# Patient Record
Sex: Female | Born: 1940
Health system: Southern US, Community
[De-identification: ages and names within clinical notes are randomized; demographics above are authoritative.]

## PROBLEM LIST (undated history)

## (undated) DIAGNOSIS — I4891 Unspecified atrial fibrillation: Secondary | ICD-10-CM

## (undated) DIAGNOSIS — I501 Left ventricular failure: Secondary | ICD-10-CM

## (undated) DIAGNOSIS — Z951 Presence of aortocoronary bypass graft: Secondary | ICD-10-CM

## (undated) DIAGNOSIS — N189 Chronic kidney disease, unspecified: Secondary | ICD-10-CM

## (undated) DIAGNOSIS — I255 Ischemic cardiomyopathy: Secondary | ICD-10-CM

## (undated) DIAGNOSIS — E1140 Type 2 diabetes mellitus with diabetic neuropathy, unspecified: Secondary | ICD-10-CM

## (undated) DIAGNOSIS — M1990 Unspecified osteoarthritis, unspecified site: Secondary | ICD-10-CM

## (undated) DIAGNOSIS — Z95810 Presence of automatic (implantable) cardiac defibrillator: Secondary | ICD-10-CM

## (undated) DIAGNOSIS — E118 Type 2 diabetes mellitus with unspecified complications: Secondary | ICD-10-CM

## (undated) DIAGNOSIS — I509 Heart failure, unspecified: Secondary | ICD-10-CM

## (undated) DIAGNOSIS — I2109 ST elevation (STEMI) myocardial infarction involving other coronary artery of anterior wall: Secondary | ICD-10-CM

## (undated) DIAGNOSIS — E785 Hyperlipidemia, unspecified: Secondary | ICD-10-CM

## (undated) DIAGNOSIS — R011 Cardiac murmur, unspecified: Secondary | ICD-10-CM

## (undated) DIAGNOSIS — I340 Nonrheumatic mitral (valve) insufficiency: Secondary | ICD-10-CM

## (undated) DIAGNOSIS — J449 Chronic obstructive pulmonary disease, unspecified: Secondary | ICD-10-CM

## (undated) DIAGNOSIS — E039 Hypothyroidism, unspecified: Secondary | ICD-10-CM

## (undated) DIAGNOSIS — I2510 Atherosclerotic heart disease of native coronary artery without angina pectoris: Secondary | ICD-10-CM

## (undated) DIAGNOSIS — N390 Urinary tract infection, site not specified: Secondary | ICD-10-CM

## (undated) DIAGNOSIS — I10 Essential (primary) hypertension: Secondary | ICD-10-CM

## (undated) DIAGNOSIS — I447 Left bundle-branch block, unspecified: Secondary | ICD-10-CM

## (undated) HISTORY — DX: Hypothyroidism, unspecified: E03.9

## (undated) HISTORY — DX: Unspecified osteoarthritis, unspecified site: M19.90

## (undated) HISTORY — PX: TOTAL HIP ARTHROPLASTY: SHX124

## (undated) HISTORY — DX: Hyperlipidemia, unspecified: E78.5

## (undated) HISTORY — DX: Atherosclerotic heart disease of native coronary artery without angina pectoris: I25.10

## (undated) HISTORY — PX: ABDOMINAL HYSTERECTOMY: SHX81

## (undated) HISTORY — DX: Heart failure, unspecified: I50.9

## (undated) HISTORY — DX: Nonrheumatic mitral (valve) insufficiency: I34.0

## (undated) HISTORY — DX: Essential (primary) hypertension: I10

## (undated) HISTORY — DX: Cardiac murmur, unspecified: R01.1

## (undated) SURGERY — Surgical Case
Anesthesia: *Unknown

---

## 1997-12-28 ENCOUNTER — Encounter: Admission: RE | Admit: 1997-12-28 | Discharge: 1997-12-28 | Payer: Self-pay | Admitting: Internal Medicine

## 1998-07-05 ENCOUNTER — Encounter: Admission: RE | Admit: 1998-07-05 | Discharge: 1998-07-05 | Payer: Self-pay | Admitting: Internal Medicine

## 1998-07-05 ENCOUNTER — Ambulatory Visit (HOSPITAL_COMMUNITY): Admission: RE | Admit: 1998-07-05 | Discharge: 1998-07-05 | Payer: Self-pay | Admitting: Internal Medicine

## 1998-12-02 ENCOUNTER — Encounter: Admission: RE | Admit: 1998-12-02 | Discharge: 1998-12-02 | Payer: Self-pay | Admitting: Internal Medicine

## 1999-01-28 ENCOUNTER — Encounter: Admission: RE | Admit: 1999-01-28 | Discharge: 1999-01-28 | Payer: Self-pay | Admitting: Internal Medicine

## 1999-02-17 ENCOUNTER — Encounter: Admission: RE | Admit: 1999-02-17 | Discharge: 1999-02-17 | Payer: Self-pay | Admitting: Internal Medicine

## 1999-03-30 ENCOUNTER — Encounter: Admission: RE | Admit: 1999-03-30 | Discharge: 1999-03-30 | Payer: Self-pay | Admitting: *Deleted

## 1999-04-04 ENCOUNTER — Encounter (INDEPENDENT_AMBULATORY_CARE_PROVIDER_SITE_OTHER): Payer: Self-pay | Admitting: Specialist

## 1999-04-04 ENCOUNTER — Other Ambulatory Visit: Admission: RE | Admit: 1999-04-04 | Discharge: 1999-04-04 | Payer: Self-pay | Admitting: Ophthalmology

## 1999-09-30 ENCOUNTER — Encounter: Admission: RE | Admit: 1999-09-30 | Discharge: 1999-09-30 | Payer: Self-pay | Admitting: Internal Medicine

## 1999-12-28 ENCOUNTER — Encounter: Admission: RE | Admit: 1999-12-28 | Discharge: 1999-12-28 | Payer: Self-pay | Admitting: Hematology and Oncology

## 2000-03-16 ENCOUNTER — Encounter: Admission: RE | Admit: 2000-03-16 | Discharge: 2000-03-16 | Payer: Self-pay | Admitting: Internal Medicine

## 2000-05-21 ENCOUNTER — Encounter: Admission: RE | Admit: 2000-05-21 | Discharge: 2000-05-21 | Payer: Self-pay | Admitting: Internal Medicine

## 2000-05-21 ENCOUNTER — Encounter: Payer: Self-pay | Admitting: Internal Medicine

## 2000-05-21 ENCOUNTER — Ambulatory Visit (HOSPITAL_COMMUNITY): Admission: RE | Admit: 2000-05-21 | Discharge: 2000-05-21 | Payer: Self-pay | Admitting: Internal Medicine

## 2000-06-05 ENCOUNTER — Encounter: Admission: RE | Admit: 2000-06-05 | Discharge: 2000-07-11 | Payer: Self-pay | Admitting: *Deleted

## 2000-07-09 ENCOUNTER — Encounter: Admission: RE | Admit: 2000-07-09 | Discharge: 2000-07-09 | Payer: Self-pay | Admitting: Hematology and Oncology

## 2000-10-09 ENCOUNTER — Encounter: Admission: RE | Admit: 2000-10-09 | Discharge: 2000-10-09 | Payer: Self-pay | Admitting: Hematology and Oncology

## 2001-01-28 ENCOUNTER — Encounter: Admission: RE | Admit: 2001-01-28 | Discharge: 2001-01-28 | Payer: Self-pay | Admitting: Internal Medicine

## 2001-02-26 ENCOUNTER — Encounter: Admission: RE | Admit: 2001-02-26 | Discharge: 2001-04-10 | Payer: Self-pay | Admitting: *Deleted

## 2001-05-21 ENCOUNTER — Encounter: Admission: RE | Admit: 2001-05-21 | Discharge: 2001-05-21 | Payer: Self-pay | Admitting: Internal Medicine

## 2001-05-28 ENCOUNTER — Encounter: Admission: RE | Admit: 2001-05-28 | Discharge: 2001-05-28 | Payer: Self-pay | Admitting: Internal Medicine

## 2001-09-02 ENCOUNTER — Encounter: Admission: RE | Admit: 2001-09-02 | Discharge: 2001-09-02 | Payer: Self-pay

## 2001-11-28 ENCOUNTER — Encounter: Admission: RE | Admit: 2001-11-28 | Discharge: 2001-11-28 | Payer: Self-pay | Admitting: Internal Medicine

## 2001-12-03 ENCOUNTER — Encounter: Admission: RE | Admit: 2001-12-03 | Discharge: 2001-12-03 | Payer: Self-pay

## 2001-12-18 ENCOUNTER — Encounter: Admission: RE | Admit: 2001-12-18 | Discharge: 2001-12-18 | Payer: Self-pay

## 2001-12-19 ENCOUNTER — Ambulatory Visit (HOSPITAL_COMMUNITY): Admission: RE | Admit: 2001-12-19 | Discharge: 2001-12-19 | Payer: Self-pay

## 2002-01-14 ENCOUNTER — Encounter: Admission: RE | Admit: 2002-01-14 | Discharge: 2002-01-14 | Payer: Self-pay | Admitting: Internal Medicine

## 2002-01-21 ENCOUNTER — Ambulatory Visit (HOSPITAL_COMMUNITY): Admission: RE | Admit: 2002-01-21 | Discharge: 2002-01-21 | Payer: Self-pay | Admitting: Internal Medicine

## 2002-01-21 ENCOUNTER — Encounter: Payer: Self-pay | Admitting: Internal Medicine

## 2002-02-17 ENCOUNTER — Encounter: Admission: RE | Admit: 2002-02-17 | Discharge: 2002-02-17 | Payer: Self-pay | Admitting: Internal Medicine

## 2002-02-18 ENCOUNTER — Encounter: Payer: Self-pay | Admitting: Internal Medicine

## 2002-02-18 ENCOUNTER — Encounter: Admission: RE | Admit: 2002-02-18 | Discharge: 2002-02-18 | Payer: Self-pay | Admitting: Internal Medicine

## 2002-02-18 ENCOUNTER — Ambulatory Visit (HOSPITAL_COMMUNITY): Admission: RE | Admit: 2002-02-18 | Discharge: 2002-02-18 | Payer: Self-pay

## 2002-03-03 ENCOUNTER — Encounter: Admission: RE | Admit: 2002-03-03 | Discharge: 2002-03-03 | Payer: Self-pay | Admitting: Internal Medicine

## 2002-03-14 ENCOUNTER — Encounter: Admission: RE | Admit: 2002-03-14 | Discharge: 2002-03-14 | Payer: Self-pay | Admitting: Internal Medicine

## 2002-04-16 ENCOUNTER — Encounter: Admission: RE | Admit: 2002-04-16 | Discharge: 2002-04-16 | Payer: Self-pay | Admitting: Internal Medicine

## 2002-04-23 ENCOUNTER — Encounter: Admission: RE | Admit: 2002-04-23 | Discharge: 2002-04-23 | Payer: Self-pay | Admitting: Internal Medicine

## 2002-08-08 ENCOUNTER — Encounter: Admission: RE | Admit: 2002-08-08 | Discharge: 2002-08-08 | Payer: Self-pay | Admitting: Internal Medicine

## 2002-11-19 ENCOUNTER — Encounter: Admission: RE | Admit: 2002-11-19 | Discharge: 2002-11-19 | Payer: Self-pay | Admitting: Internal Medicine

## 2003-04-06 ENCOUNTER — Encounter: Admission: RE | Admit: 2003-04-06 | Discharge: 2003-04-06 | Payer: Self-pay | Admitting: Internal Medicine

## 2003-05-14 ENCOUNTER — Encounter: Admission: RE | Admit: 2003-05-14 | Discharge: 2003-05-14 | Payer: Self-pay | Admitting: Obstetrics and Gynecology

## 2003-05-25 ENCOUNTER — Encounter: Admission: RE | Admit: 2003-05-25 | Discharge: 2003-05-25 | Payer: Self-pay | Admitting: Internal Medicine

## 2003-08-11 ENCOUNTER — Encounter: Admission: RE | Admit: 2003-08-11 | Discharge: 2003-08-11 | Payer: Self-pay | Admitting: Internal Medicine

## 2004-01-05 ENCOUNTER — Encounter: Admission: RE | Admit: 2004-01-05 | Discharge: 2004-01-05 | Payer: Self-pay | Admitting: Internal Medicine

## 2004-01-07 ENCOUNTER — Encounter (INDEPENDENT_AMBULATORY_CARE_PROVIDER_SITE_OTHER): Payer: Self-pay | Admitting: Internal Medicine

## 2004-01-07 ENCOUNTER — Ambulatory Visit (HOSPITAL_COMMUNITY): Admission: RE | Admit: 2004-01-07 | Discharge: 2004-01-07 | Payer: Self-pay | Admitting: Internal Medicine

## 2004-06-14 ENCOUNTER — Ambulatory Visit: Payer: Self-pay | Admitting: Internal Medicine

## 2004-06-22 ENCOUNTER — Encounter: Admission: RE | Admit: 2004-06-22 | Discharge: 2004-06-22 | Payer: Self-pay | Admitting: Internal Medicine

## 2004-06-30 ENCOUNTER — Ambulatory Visit: Payer: Self-pay | Admitting: Internal Medicine

## 2004-11-04 ENCOUNTER — Ambulatory Visit: Payer: Self-pay | Admitting: Internal Medicine

## 2004-11-07 ENCOUNTER — Ambulatory Visit: Payer: Self-pay | Admitting: Internal Medicine

## 2004-11-14 ENCOUNTER — Encounter: Admission: RE | Admit: 2004-11-14 | Discharge: 2005-02-12 | Payer: Self-pay | Admitting: Internal Medicine

## 2005-01-30 ENCOUNTER — Ambulatory Visit: Payer: Self-pay | Admitting: Internal Medicine

## 2005-01-30 ENCOUNTER — Ambulatory Visit (HOSPITAL_COMMUNITY): Admission: RE | Admit: 2005-01-30 | Discharge: 2005-01-30 | Payer: Self-pay | Admitting: Internal Medicine

## 2005-02-06 ENCOUNTER — Ambulatory Visit: Payer: Self-pay | Admitting: Internal Medicine

## 2005-02-16 ENCOUNTER — Encounter (INDEPENDENT_AMBULATORY_CARE_PROVIDER_SITE_OTHER): Payer: Self-pay | Admitting: *Deleted

## 2005-02-16 ENCOUNTER — Ambulatory Visit (HOSPITAL_COMMUNITY): Admission: RE | Admit: 2005-02-16 | Discharge: 2005-02-16 | Payer: Self-pay | Admitting: Internal Medicine

## 2006-06-18 ENCOUNTER — Ambulatory Visit: Payer: Self-pay | Admitting: Gastroenterology

## 2006-07-02 ENCOUNTER — Ambulatory Visit: Payer: Self-pay | Admitting: Gastroenterology

## 2006-07-02 ENCOUNTER — Encounter (INDEPENDENT_AMBULATORY_CARE_PROVIDER_SITE_OTHER): Payer: Self-pay | Admitting: *Deleted

## 2006-08-06 DIAGNOSIS — E1169 Type 2 diabetes mellitus with other specified complication: Secondary | ICD-10-CM | POA: Insufficient documentation

## 2006-08-06 DIAGNOSIS — M1990 Unspecified osteoarthritis, unspecified site: Secondary | ICD-10-CM | POA: Insufficient documentation

## 2006-08-06 DIAGNOSIS — E118 Type 2 diabetes mellitus with unspecified complications: Secondary | ICD-10-CM

## 2006-08-06 DIAGNOSIS — I10 Essential (primary) hypertension: Secondary | ICD-10-CM | POA: Insufficient documentation

## 2006-08-06 DIAGNOSIS — E785 Hyperlipidemia, unspecified: Secondary | ICD-10-CM | POA: Insufficient documentation

## 2006-08-06 HISTORY — DX: Type 2 diabetes mellitus with unspecified complications: E11.8

## 2006-08-06 HISTORY — DX: Unspecified osteoarthritis, unspecified site: M19.90

## 2006-12-20 DIAGNOSIS — E669 Obesity, unspecified: Secondary | ICD-10-CM | POA: Insufficient documentation

## 2007-09-09 ENCOUNTER — Encounter: Admission: RE | Admit: 2007-09-09 | Discharge: 2007-09-09 | Payer: Self-pay | Admitting: Orthopaedic Surgery

## 2007-10-07 ENCOUNTER — Encounter: Admission: RE | Admit: 2007-10-07 | Discharge: 2007-10-07 | Payer: Self-pay | Admitting: Orthopaedic Surgery

## 2007-11-05 ENCOUNTER — Inpatient Hospital Stay (HOSPITAL_COMMUNITY): Admission: RE | Admit: 2007-11-05 | Discharge: 2007-11-11 | Payer: Self-pay | Admitting: Orthopaedic Surgery

## 2007-11-10 ENCOUNTER — Ambulatory Visit: Payer: Self-pay | Admitting: Vascular Surgery

## 2007-11-10 ENCOUNTER — Encounter (INDEPENDENT_AMBULATORY_CARE_PROVIDER_SITE_OTHER): Payer: Self-pay | Admitting: Orthopaedic Surgery

## 2008-02-03 ENCOUNTER — Encounter: Admission: RE | Admit: 2008-02-03 | Discharge: 2008-02-03 | Payer: Self-pay | Admitting: Orthopaedic Surgery

## 2008-05-29 ENCOUNTER — Ambulatory Visit (HOSPITAL_COMMUNITY): Admission: RE | Admit: 2008-05-29 | Discharge: 2008-05-29 | Payer: Self-pay | Admitting: Internal Medicine

## 2008-07-25 ENCOUNTER — Encounter (INDEPENDENT_AMBULATORY_CARE_PROVIDER_SITE_OTHER): Payer: Self-pay | Admitting: Emergency Medicine

## 2008-07-25 ENCOUNTER — Ambulatory Visit: Payer: Self-pay | Admitting: *Deleted

## 2008-07-25 ENCOUNTER — Emergency Department (HOSPITAL_COMMUNITY): Admission: EM | Admit: 2008-07-25 | Discharge: 2008-07-25 | Payer: Self-pay | Admitting: Emergency Medicine

## 2008-12-11 ENCOUNTER — Inpatient Hospital Stay (HOSPITAL_COMMUNITY): Admission: RE | Admit: 2008-12-11 | Discharge: 2008-12-16 | Payer: Self-pay | Admitting: Orthopaedic Surgery

## 2008-12-12 ENCOUNTER — Encounter (INDEPENDENT_AMBULATORY_CARE_PROVIDER_SITE_OTHER): Payer: Self-pay | Admitting: Orthopedic Surgery

## 2008-12-12 ENCOUNTER — Ambulatory Visit: Payer: Self-pay | Admitting: Vascular Surgery

## 2009-03-19 ENCOUNTER — Emergency Department (HOSPITAL_COMMUNITY): Admission: EM | Admit: 2009-03-19 | Discharge: 2009-03-19 | Payer: Self-pay | Admitting: Emergency Medicine

## 2009-06-03 ENCOUNTER — Ambulatory Visit (HOSPITAL_COMMUNITY): Admission: RE | Admit: 2009-06-03 | Discharge: 2009-06-03 | Payer: Self-pay | Admitting: Internal Medicine

## 2009-08-28 HISTORY — PX: CORNEAL TRANSPLANT: SHX108

## 2010-06-06 ENCOUNTER — Ambulatory Visit (HOSPITAL_COMMUNITY): Admission: RE | Admit: 2010-06-06 | Discharge: 2010-06-06 | Payer: Self-pay | Admitting: Internal Medicine

## 2010-12-07 LAB — PROTIME-INR
INR: 2 — ABNORMAL HIGH (ref 0.00–1.49)
INR: 2.5 — ABNORMAL HIGH (ref 0.00–1.49)
INR: 2.7 — ABNORMAL HIGH (ref 0.00–1.49)
Prothrombin Time: 13.8 seconds (ref 11.6–15.2)
Prothrombin Time: 19.4 seconds — ABNORMAL HIGH (ref 11.6–15.2)
Prothrombin Time: 24.3 seconds — ABNORMAL HIGH (ref 11.6–15.2)
Prothrombin Time: 28.9 seconds — ABNORMAL HIGH (ref 11.6–15.2)
Prothrombin Time: 30.4 seconds — ABNORMAL HIGH (ref 11.6–15.2)

## 2010-12-07 LAB — CBC
Hemoglobin: 12.4 g/dL (ref 12.0–15.0)
MCHC: 33.5 g/dL (ref 30.0–36.0)
MCV: 81.2 fL (ref 78.0–100.0)
Platelets: 204 10*3/uL (ref 150–400)
Platelets: 220 10*3/uL (ref 150–400)
Platelets: 290 10*3/uL (ref 150–400)
RBC: 3.41 MIL/uL — ABNORMAL LOW (ref 3.87–5.11)
RDW: 16.8 % — ABNORMAL HIGH (ref 11.5–15.5)
RDW: 17 % — ABNORMAL HIGH (ref 11.5–15.5)
WBC: 10.5 10*3/uL (ref 4.0–10.5)
WBC: 10.7 10*3/uL — ABNORMAL HIGH (ref 4.0–10.5)

## 2010-12-07 LAB — GLUCOSE, CAPILLARY
Glucose-Capillary: 114 mg/dL — ABNORMAL HIGH (ref 70–99)
Glucose-Capillary: 117 mg/dL — ABNORMAL HIGH (ref 70–99)
Glucose-Capillary: 117 mg/dL — ABNORMAL HIGH (ref 70–99)
Glucose-Capillary: 127 mg/dL — ABNORMAL HIGH (ref 70–99)
Glucose-Capillary: 132 mg/dL — ABNORMAL HIGH (ref 70–99)
Glucose-Capillary: 147 mg/dL — ABNORMAL HIGH (ref 70–99)
Glucose-Capillary: 81 mg/dL (ref 70–99)
Glucose-Capillary: 83 mg/dL (ref 70–99)

## 2010-12-07 LAB — BASIC METABOLIC PANEL
BUN: 9 mg/dL (ref 6–23)
BUN: 22 mg/dL (ref 6–23)
CO2: 30 mEq/L (ref 19–32)
Calcium: 9.1 mg/dL (ref 8.4–10.5)
Calcium: 10.4 mg/dL (ref 8.4–10.5)
Chloride: 100 mEq/L (ref 96–112)
Chloride: 101 mEq/L (ref 96–112)
Creatinine, Ser: 1.05 mg/dL (ref 0.4–1.2)
Creatinine, Ser: 1.26 mg/dL — ABNORMAL HIGH (ref 0.4–1.2)
GFR calc Af Amer: 46 mL/min — ABNORMAL LOW (ref >60)
GFR calc Af Amer: 51 mL/min — ABNORMAL LOW (ref >60)
GFR calc non Af Amer: 42 mL/min — ABNORMAL LOW (ref >60)
GFR calc non Af Amer: 39 mL/min — ABNORMAL LOW (ref >60)
Glucose, Bld: 127 mg/dL — ABNORMAL HIGH (ref 70–99)
Glucose, Bld: 137 mg/dL — ABNORMAL HIGH (ref 70–99)
Potassium: 3.8 mEq/L (ref 3.5–5.1)
Potassium: 4.2 mEq/L (ref 3.5–5.1)
Sodium: 136 mEq/L (ref 135–145)
Sodium: 142 mEq/L (ref 135–145)

## 2011-01-10 NOTE — Discharge Summary (Signed)
[name], [name]                 ACCOUNT NO.:  [id number]   MEDICAL RECORD NO.:  [id number]          PATIENT TYPE:  INP   LOCATION:  5013                         FACILITY:  [facility]   PHYSICIAN:  Lind Guest. Ninfa Linden, M.D.DATE OF BIRTH:  December 05, 1940   DATE OF ADMISSION:  11/05/2007  DATE OF DISCHARGE:  11/11/2007                               DISCHARGE SUMMARY   ADMISSION DIAGNOSIS:  Severe degenerative joint disease, right hip.   SECONDARY DIAGNOSES:  1. Hypothyroid.  2. Heart disease.  3. Type 2 diabetes.  4. High-blood pressure.   DISCHARGE DIAGNOSIS:  Status post right total hip arthroplasty.   SECONDARY DISCHARGE DIAGNOSES:  1. Hypothyroidism.  2. Heart disease.  3. Hypothyroid.  4. Type 2 diabetes.  5. High-blood pressure.   PROCEDURES:  Right total hip arthroplasty on 11/05/2007.   DISPOSITION:  To skilled nursing facility.   DISCHARGE MEDICATIONS:  1. Synthroid 100 mcg p.o. daily.  2. Janumet 50-1000 p.o. b.i.d.  3. Amlodipine 5 mg p.o. daily.  4. Furosemide 50 mg p.o. daily.  5. Glimepiride 4 mg p.o. daily.  6. Benazepril 40 mg p.o. daily.  7. Aspirin 81 mg p.o. daily.  8. Prednisolone eye drops 1%  4 times a day of right eye.  9. Percocet 5/500 one to two p.o. q.4-6 h. p.r.n. pain.  10.Robaxin 500 mg p.o. q.6 h. p.r.n., spasms.  11.Senokot 2 tablets p.o. b.i.d. with meals.  12.Coumadin 1 mg p.o. daily at 1800 hours with  titration for target      INR 2.0-3.0.   HOSPITAL COURSE:  The patient, [name] is a 70 year old female with  diabetes, heart disease, high blood pressure, and hypothyroidism who had  severe debilitating arthritis of right hip.  Due to its impact on her  activities of daily living it was recommended she undergo a total hip  replacement.  She was taken to the operating room on day of admission,  where she underwent the aforementioned total hip replacement without  complications.  Postoperatively,  __________weightbear as tolerated.  She worked slowly with physical therapy.  She is an obese individual.  She did have acute blood loss anemia and this did require a transfusion.  First, she responded well.  By the day of discharge, it was felt she  needed further assistance at the skilled nursing facility to help her  gain her mobility.  She was otherwise afebrile with stable vital signs.  She did have an ultrasound obtained of her leg and no blood clot or DVT  was found.  It was felt, she can be discharged safely to skilled nursing  facility.   DISCHARGE INSTRUCTIONS:  While she is in the skilled nursing facility,  she can make every effort to access for mobilization with weightbearing  as tolerated.  Anterior hip precautions will need to be continued be  utilized as well.  She will follow up home which will be made with  Dr.  Jean Rosenthal office's at [facility] at 2 weeks after  her discharge.      Lind Guest. Ninfa Linden, M.D.  Electronically Signed  CYB/MEDQ  D:  11/11/2007  T:  11/11/2007  Job:  [id number]

## 2011-01-10 NOTE — Op Note (Signed)
NAMELAYTON, SOZIO                 ACCOUNT NO.:  [id number]   MEDICAL RECORD NO.:  [id number]          PATIENT TYPE:  INP   LOCATION:  2858                         FACILITY:  [facility]   PHYSICIAN:  Lind Guest. Ninfa Linden, M.D.DATE OF BIRTH:  [birth year]   DATE OF PROCEDURE:  11/05/2007  DATE OF DISCHARGE:                               OPERATIVE REPORT   PREOPERATIVE DIAGNOSIS:  Right hip severe degenerative joint  disease/osteoarthritis.   POSTOPERATIVE DIAGNOSIS:  Right hip severe degenerative joint  disease/osteoarthritis.   PROCEDURE:  Right total hip arthroplasty.   COMPONENTS:  DePuy ASR acetabular component size 52, ASR size 46 head,  size 6 Summit DuoFix Press-Fit femoral stem, +2 taper sleeve.   SURGEON:  Jean Rosenthal, M.D.   ASSISTANT:  R.N.F.A.   ANESTHESIA:  General.   ANTIBIOTICS:  2 g IV Ancef.   BLOOD LOSS:  200 mL.   COMPLICATIONS:  None.   INDICATIONS:  Briefly, Ms. Quattrone is a 70 year old morbidly obese female  with bilateral hip degenerative joint disease and osteoarthritis of the  right hip being worse than left.  We have tried to temporize this with  injections in her hips and anti-inflammatories, and it has gotten to  where it is greatly affecting her activities of daily living.  She is  wishing to proceed with total hip arthroplasty.  The risks and benefits  of hip replacement surgery are explained to her including the risk of  blood loss, nerve and vessel injury, DVT and fatal PE.  She understood  the risks and wished to proceed with surgery.   PROCEDURE DESCRIPTION:  After informed consent was obtained, appropriate  right hip was marked.  She was brought to the operating room and placed  supine on the operating table.  General anesthesia was then obtained.  She was then turned into a lateral decubitus positive with right  operative hip up.  This was the marked hip as well.  Positioners were  placed as well as an axillary roll and  appropriate padding around her  head, neck, and arms.  Her right knee and leg was then prepped and  draped with DuraPrep and sterile drapes including a sterile stockinette.  A time-out was called, and she was identified as the correct patient and  the correct right extremity/hip.  I then made an incision directly over  the greater trochanter and carried this proximally and distally.  There  was abundant fatty tissue due to her morbid obesity.  This took a while  to dissect down to the iliotibial band, and once I found this, I was  able to divide this sharply with a knife to expose the greater  trochanter.  I then identified the gluteus medius and minimus tendons  and divided these off of the vastus ridge as a sleeve and took these  anteriorly.  I then dissected deeper to find the hip capsule and to  divide the hip capsule in a T-type fashion to expose the femoral head  and neck.  With the leg bag utilized, I was able to externally rotate  and flex the  left hip off of the side of the table to dislocate the hip.  I then made a femoral neck cut, approximately two fingerbreadths  proximal to the lesser trochanter due to her valgus-shaped neck.  This  was removed, and the acetabulum was cleared of debris including the  labrum sharply with a knife.  I then started reaming in 1-mm increments  from a size 45 up to a size 51, after the 47 reamer, this got me through  the teardrop, and then I angled the reamers for the correct version and  inclination.  Once I got down to the size 51, I placed a trial insert,  and this had a good rim fit.  I then placed the real HA coated DuoFix  acetabular component which was an ASR size 52 and knocked this in place  into the acetabulum.  This was felt to be secure.  I then flexed and  externally rotated the leg off of the table and used initiating reamer  to enter the femoral canal for the greater trochanter.  I then used a  canal finder and then reamed with the  size one reamer, then followed  with a lateralizing reamer and finally up to the size 6 reamers.  After  this, I used a size 1 broach up to a size 6 broach, continuing to try  lateralize this along the way.  Once the size 6 showed that it was a  good press fit, I placed a +2 tapered sleeve with a standard neck and a  58 trial head and reduced this in the acetabulum.  I felt her leg  lengths were directly equal, and there was no tightness and good shuck  with this, and I put her hip through range of motion, and this was  stable.  I then removed all components and thoroughly irrigated the  acetabulum as well as the femur.  I then placed the real size 6 Summit  stem with hydroxyapatite with HA coating and then tapped this gently  into place.  I then placed the real +2 tapered neck and size 46 head.  I  then rereduced this down to the acetabulum, and this was felt to be  stable even through range of motion.  I then next irrigated the hip  joint out again and closed the hip capsule with interrupted #1 Ethibond  suture.  I then reapproximated the gluteus medius and minimus tendon and  the sleeve to the vastus ridge using interrupted #1 Ethibond suture, and  I oversewed this to have a good secure repair.  I then closed the  iliotibial band with interrupted #1 Vicryl suture followed by 0 Vicryl  in the deep fat, 2-0 Vicryl in the subcutaneous tissue, and staples on  the skin.  Well-padded sterile dressing was then applied.  The patient  was next rolled into a supine position.  The leg lengths again were  found to be equal and the hip felt stable.  The patient was awakened,  extubated, and taken to the recovery room in stable condition.  All  final counts were correct.  Final blood loss was 200 mL, and there were  no complications noted.      Lind Guest. Ninfa Linden, M.D.  Electronically Signed     CYB/MEDQ  D:  11/05/2007  T:  11/06/2007  Job:  [id number]

## 2011-01-10 NOTE — Op Note (Signed)
NAMEKANDY, STEUBER                 ACCOUNT NO.:  [id number]   MEDICAL RECORD NO.:  [id number]          PATIENT TYPE:  INP   LOCATION:  5007                         FACILITY:  [facility]   PHYSICIAN:  Lind Guest. Ninfa Linden, M.D.DATE OF BIRTH:  [birth year]   DATE OF PROCEDURE:  12/11/2008  DATE OF DISCHARGE:                               OPERATIVE REPORT   PREOPERATIVE DIAGNOSIS:  Severe osteoarthritis and degenerative joint  disease of left hip.   POSTOPERATIVE DIAGNOSIS:  Severe osteoarthritis and degenerative joint  disease of left hip.   PROCEDURE:  Left total hip arthroplasty.   IMPLANTS:  DePuy ASR acetabular component size 52, size 46 metal head  with metal-on-metal liner, +2 tapered spacer, size 16.5 small stature  AML fully porous-coated stem.   SURGEON:  Lind Guest. Ninfa Linden, MD   ASSISTANT:  Epimenio Foot, PA-C   ANESTHESIA:  General.   BLOOD LOSS:  100 mL.   ANTIBIOTICS:  2 g of IV Ancef.   COMPLICATIONS:  None.   INDICATIONS:  Briefly, Ms. Cabbagestalk is a 70 year old female who I have  performed a previous total hip arthroplasty on her right hip due to  severe degenerative joint disease and osteoarthritis.  This was a year  ago and she had done extremely well.  She wished to proceed now with a  left total hip replacement.  The left hip has been bothering her  severely and she has radiographic evidence of severe osteoarthritis of  that hip.  The risks and benefits of surgery have been explained to her  in length and well understood and she agrees to proceed with surgery.   PROCEDURE:  After informed consent was obtained, appropriate left hip  was marked.  She was brought to the operating room and placed supine on  the operating table.  General anesthesia was then obtained.  She was  then turned into a lateral decubitus position with left operative hip  up.  Hip positioners were placed in the front and the back and her leg  was prepped and draped with DuraPrep  and sterile drapes including  sterile stockinette.  A time-out was called to identify correct patient  and correct left hip.  Of note, we did place an axillary roll as well.  An incision was then made directly over the greater trochanter and  carried down into the deep tissues.  There was abundant adipose tissue  and the patient is of note morbidly obese.  The IT band was identified  and we divided this and then placed a Charnley retractor.  That is when  I started directing myself with an anterolateral approach to the hip.  I  took down about approximately two-thirds of the gluteus medius and  minimus tendons off the greater trochanter and reflected these  anteriorly.  I was then unable to easily identify the hip capsule and  divided this in a T-type fashion.  We next dislocated the hip and  brought it off the front of the table in a leg bag.  A neck cut was made  just proximal to the greater trochanter  and the head was removed.  I  then cleaned acetabulum of debris including marginal osteophytes and the  labrum.  I then started reaming starting from a size 44 reamer up to  size 51.  I first medialized the cup and then with the 48 through 51  reamers, I was able to then obtain my version and inclination.  Once the  final reaming was done, I then trialed a size 50 cup and this bottomed  out.  I then chose the real ASR size 52 DePuy fully porous-coated cup  and then with a metal liner and placed this without difficulties.  I  grabbed on to the cup and pulled real hard and it did not move and it  was felt to be well seated.  We next turned our attention to the femur.  With the leg flexed and externally rotated off the table, I used  initiating reamer to open up the femoral canal.  With initiating reamer,  I did broached the posterior cortex of the femur at the level up and  just below the lesser trochanter and made a hole about the size of a  dime into the bone.  At that point, I felt it  would be more appropriate  to proceed with a fully porous-coated stem due to the fact that the  patient is morbidly obese and I think this would support her femur  better.  We then used the reamers and reamed in 1-mm increments up to a  16.5 and I got good cortical contact with the reamer.  She does have  thick bone.  I then broached up to a size 16.5 small stature stem.  We  then trialed several different neck lengths and 46 head size and felt  that the +2 liner with a 46 head was the appropriate fit.  I did obtain  an intraoperative x-ray with using the proximally porous-coated tapered  stem and when the x-rays were obtained, I was pleased with the version,  but I did feel at that point that I would want to place an AML stem and  I did reamed for the appropriate levels for this.  I then removed all  trial instrumentation and placed the real 16.5-mm short stature AML  stem/femoral component without difficulties.  I then trialed again the  +2 liner and a 46 head and this was found to be stable with good shuck.  I placed the real +2 tapered spacer and the size 46 head and tapped  these into placed and reduced the acetabulum.  I put it through another  range of motion and I felt like my leg lengths were as close to equal as  possible.  Of note, this was very difficult to judge given her morbid  obesity.  I then copiously irrigated the hip joint and I closed the hip  capsule with interrupted #1 Ethibond suture.  I reapproximated the  gluteus medius and minimus tendons back to the vastus ridge of the  greater trochanter and oversewed this with interrupted #1 Ethibond  sutures well.  The iliotibial band was reapproximated with interrupted  #1 Vicryl suture.  I used a few 0 Vicryl in the deep tissue and then 2-0  Vicryl subcutaneous tissue and interrupted staples on the skin.  A  Mepitel dressing was then applied.  The patient was awakened, extubated,  and taken recovery room in stable  condition.  When she was rolled into  supine position, I felt her leg lengths  were as close to equal as  possible.      Lind Guest. Ninfa Linden, M.D.  Electronically Signed     CYB/MEDQ  D:  12/11/2008  T:  12/12/2008  Job:  [id number]

## 2011-01-10 NOTE — Discharge Summary (Signed)
NAMENEVIN, VEREB                 ACCOUNT NO.:  [id number]   MEDICAL RECORD NO.:  [id number]          PATIENT TYPE:  INP   LOCATION:  5007                         FACILITY:  [facility]   PHYSICIAN:  Lind Guest. Ninfa Linden, M.D.DATE OF BIRTH:  May 13, 1941   DATE OF ADMISSION:  12/11/2008  DATE OF DISCHARGE:  12/15/2008                               DISCHARGE SUMMARY   ADMITTING DIAGNOSES:  Severe osteoarthritis and degenerative joint  disease, left hip.   SECONDARY DIAGNOSES:  1. Diabetes.  2. High blood pressure.  3. High cholesterol.  4. Hypothyroidism.  5. Arthritis.   PRIMARY DISCHARGE DIAGNOSES:  Status post left total hip arthroplasty  secondary to severe osteoarthritis, left hip.   PROCEDURES:  Left total hip arthroplasty on December 11, 2008.   HOSPITAL COURSE:  Briefly, Ms. Thomassie is a 70 year old female with  debilitating arthritis involving both her hips.  She underwent a  previous total hip arthroplasty of her right hip and now presented for a  total hip arthroplasty of her left hip.  She was taken to the operating  room on the day of admission where she underwent a left total hip  arthroplasty without complications.  These was performed through an  anterolateral approach.  Postoperatively, she was admitted to the  regular orthopedic floor bed and served regular physical therapy and  occupational therapy.  After her first hospitalization, it was felt that  she would benefit from skilled nursing facility and will suit the same  recommendations at this time from the therapy standpoint and from a  family standpoint.  She did well during her hospitalization and was  uncomplicated.   On the day of discharge, it felt that she could be discharged safely to  skilled nursing facility for further convalescence and physical therapy  prior to transitioning to home environment.   DISPOSITION:  Skilled nursing facility.   DISCHARGE INSTRUCTIONS:  While she is at the skilled nursing  facility,  she can get her incision wet in the shower.  She should make every  effort to weight bear as tolerated on both of her hips with anterior hip  precautions for the left hip.  Dry dressing can be applied daily to her  left hip incision.  A followup appointment should be established in Dr.  Trevor Mace office in 2 weeks after discharge.   DISCHARGE MEDICATIONS:  1. Coumadin adjusted daily 1800 hours for a target INR of 2.0 to 2.5.  2. Percocet 5/325 one to two p.o. q.4-6 h. p.r.n. pain.  3. Robaxin 500 mg 1 p.o. q.6 h. p.r.n. spasms.  4. Crestor 20 mg one-half tablet p.o. daily.  5. Amlodipine 5 mg p.o. daily.  6. Lasix 40 mg p.o. daily.  7. Levothyroxine 112 mcg p.o. daily.  8. Janumet 50 mg/1000 mg p.o. b.i.d.  9. Aspirin 81 mg p.o. daily.  10.Vitamin D 50,000 units p.o. daily.  11.Benazepril 40 mg p.o. daily.  12.Prednisolone eyedrops 1 drop in the right eye 3 times daily.  13.Glimepiride 4 mg p.o. daily.  14.Alendronate 70 mg p.o. every weekly.      Harrell Gave  Y. Ninfa Linden, M.D.  Electronically Signed     CYB/MEDQ  D:  12/15/2008  T:  12/16/2008  Job:  [id number]

## 2011-05-08 ENCOUNTER — Other Ambulatory Visit (HOSPITAL_COMMUNITY): Payer: Self-pay | Admitting: Internal Medicine

## 2011-05-08 DIAGNOSIS — Z1231 Encounter for screening mammogram for malignant neoplasm of breast: Secondary | ICD-10-CM

## 2011-05-22 LAB — BASIC METABOLIC PANEL
BUN: 12
CO2: 27
CO2: 27
Calcium: 10.2
Chloride: 103
Chloride: 103
GFR calc Af Amer: >60
GFR calc Af Amer: >60
GFR calc non Af Amer: 53 — ABNORMAL LOW
GFR calc non Af Amer: 55 — ABNORMAL LOW
Glucose, Bld: 148 — ABNORMAL HIGH
Glucose, Bld: 70
Glucose, Bld: 91
Potassium: 3.6
Potassium: 4.3
Potassium: 4.5
Sodium: 135
Sodium: 137
Sodium: 141

## 2011-05-22 LAB — CROSSMATCH: Antibody Screen: NEGATIVE

## 2011-05-22 LAB — CBC
HCT: 27.6 — ABNORMAL LOW
HCT: 32.3 — ABNORMAL LOW
HCT: 37.1
Hemoglobin: 10.3 — ABNORMAL LOW
Hemoglobin: 10.7 — ABNORMAL LOW
Hemoglobin: 12
MCHC: 33.5
MCV: 79.8
MCV: 80.2
Platelets: 250
RBC: 3.81 — ABNORMAL LOW
RBC: 3.97
RBC: 4.57
RDW: 16.2 — ABNORMAL HIGH
RDW: 16.6 — ABNORMAL HIGH
WBC: 11.4 — ABNORMAL HIGH

## 2011-05-22 LAB — APTT: aPTT: 30

## 2011-05-22 LAB — DIFFERENTIAL
Basophils Absolute: 0.1
Eosinophils Relative: 2
Lymphocytes Relative: 33
Lymphs Abs: 3.8
Monocytes Absolute: 0.7
Neutro Abs: 6.7

## 2011-05-22 LAB — PROTIME-INR
INR: 1.9 — ABNORMAL HIGH
INR: 2.8 — ABNORMAL HIGH
Prothrombin Time: 22.6 — ABNORMAL HIGH
Prothrombin Time: 27.9 — ABNORMAL HIGH
Prothrombin Time: 30.3 — ABNORMAL HIGH

## 2011-05-22 LAB — URINALYSIS, ROUTINE W REFLEX MICROSCOPIC
Bilirubin Urine: NEGATIVE
Glucose, UA: NEGATIVE
Hgb urine dipstick: NEGATIVE
Protein, ur: NEGATIVE

## 2011-06-08 ENCOUNTER — Ambulatory Visit (HOSPITAL_COMMUNITY)
Admission: RE | Admit: 2011-06-08 | Discharge: 2011-06-08 | Disposition: A | Discharge status: Home / Self Care | Payer: Medicare Other | Source: Ambulatory Visit | Attending: Internal Medicine | Admitting: Internal Medicine

## 2011-06-08 DIAGNOSIS — Z1231 Encounter for screening mammogram for malignant neoplasm of breast: Secondary | ICD-10-CM

## 2011-06-30 ENCOUNTER — Encounter (INDEPENDENT_AMBULATORY_CARE_PROVIDER_SITE_OTHER): Payer: Medicare Other | Admitting: Ophthalmology

## 2011-06-30 DIAGNOSIS — H43819 Vitreous degeneration, unspecified eye: Secondary | ICD-10-CM

## 2011-06-30 DIAGNOSIS — H3530 Unspecified macular degeneration: Secondary | ICD-10-CM

## 2011-06-30 DIAGNOSIS — E11319 Type 2 diabetes mellitus with unspecified diabetic retinopathy without macular edema: Secondary | ICD-10-CM

## 2011-10-11 ENCOUNTER — Encounter: Payer: Self-pay | Admitting: Gastroenterology

## 2011-11-08 ENCOUNTER — Encounter: Payer: Self-pay | Admitting: Gastroenterology

## 2011-12-11 ENCOUNTER — Ambulatory Visit (AMBULATORY_SURGERY_CENTER): Payer: Medicare Other | Admitting: *Deleted

## 2011-12-11 ENCOUNTER — Encounter: Payer: Self-pay | Admitting: Gastroenterology

## 2011-12-11 VITALS — Ht 62.0 in | Wt 261.3 lb

## 2011-12-11 DIAGNOSIS — Z1211 Encounter for screening for malignant neoplasm of colon: Secondary | ICD-10-CM

## 2011-12-11 DIAGNOSIS — Z860100 Personal history of colon polyps, unspecified: Secondary | ICD-10-CM

## 2011-12-11 MED ORDER — PEG-KCL-NACL-NASULF-NA ASC-C 100 G PO SOLR: 1.0000 | Freq: Once | ORAL | Status: DC

## 2011-12-25 ENCOUNTER — Encounter: Payer: Self-pay | Admitting: Gastroenterology

## 2011-12-25 ENCOUNTER — Ambulatory Visit (AMBULATORY_SURGERY_CENTER): Payer: Medicare Other | Admitting: Gastroenterology

## 2011-12-25 VITALS — BP 147/102 | HR 79 | Temp 98.2°F | Resp 20 | Ht 62.0 in | Wt 261.0 lb

## 2011-12-25 DIAGNOSIS — K5730 Diverticulosis of large intestine without perforation or abscess without bleeding: Secondary | ICD-10-CM

## 2011-12-25 DIAGNOSIS — Z1211 Encounter for screening for malignant neoplasm of colon: Secondary | ICD-10-CM

## 2011-12-25 DIAGNOSIS — D126 Benign neoplasm of colon, unspecified: Secondary | ICD-10-CM

## 2011-12-25 DIAGNOSIS — Z860100 Personal history of colon polyps, unspecified: Secondary | ICD-10-CM

## 2011-12-25 LAB — GLUCOSE, CAPILLARY: Glucose-Capillary: 86 mg/dL (ref 70–99)

## 2011-12-25 MED ORDER — SODIUM CHLORIDE 0.9 % IV SOLN: 500.0000 mL | INTRAVENOUS | Status: DC

## 2011-12-25 NOTE — Progress Notes (Signed)
Noticed small hives and area around ekg leads. Pt states that is a normal response for her. Acknowledged that if it becomes worse or she notices any shortness of breath or swelling to call 911.

## 2011-12-25 NOTE — Op Note (Signed)
[facility] [address]. [place], [place]  [id number]  COLONOSCOPY PROCEDURE REPORT  PATIENT:  Elaine [name], Elaine [name]  MR#:  [id number] BIRTHDATE:  1940-11-25, 38 yrs. old  GENDER:  female ENDOSCOPIST:  Loralee Pacas. Sharlett Iles, MD, [facility] REF. BY: PROCEDURE DATE:  12/25/2011 PROCEDURE:  Colonoscopy with snare polypectomy ASA CLASS:  Class III INDICATIONS:  history of pre-cancerous (adenomatous) colon polyps  MEDICATIONS:   propofol (Diprivan) 200 mg IV  DESCRIPTION OF PROCEDURE:   After the risks and benefits and of the procedure were explained, informed consent was obtained. Digital rectal exam was performed and revealed no abnormalities. The LB CF-[id number] endoscope was introduced through the anus and advanced to the cecum, which was identified by both the appendix and ileocecal valve.  The quality of the prep was adequate, using MoviPrep.  The instrument was then slowly withdrawn as the colon was fully examined. <<PROCEDUREIMAGES>>  FINDINGS:  Severe diverticulosis was found in the sigmoid to descending colon segments.  A sessile polyp was found in the rectum. 3 MM FLAT POLYP COLD SNARE REMOVED.NOT RECOVERED.  This was otherwise a normal examination of the colon.   Retroflexed views in the rectum revealed no abnormalities.    The scope was then withdrawn from the patient and the procedure completed.  COMPLICATIONS:  None ENDOSCOPIC IMPRESSION: 1) Severe diverticulosis in the sigmoid to descending colon segments 2) Sessile polyp in the rectum 3) Otherwise normal examination RECOMMENDATIONS: 1) Continue current medications 2) High fiber diet with liberal fluid intake. 3) Repeat Colonoscopy in 5 years.  REPEAT EXAM:  No  ______________________________ Loralee Pacas. Sharlett Iles, MD, Elaine [name]  CC:  Elaine Rouse, MD  n. Elaine [name]:   Loralee Pacas. Shandale Malak at 12/25/2011 10:34 AM  [name], [id number]

## 2011-12-25 NOTE — Progress Notes (Signed)
Patient did not experience any of the following events: a burn prior to discharge; a fall within the facility; wrong site/side/patient/procedure/implant event; or a hospital transfer or hospital admission upon discharge from the facility. (G8907) Patient did not have preoperative order for IV antibiotic SSI prophylaxis. (G8918) Patient did not have preoperative order for IV antibiotic SSI prophylaxis. (G8918)  

## 2011-12-25 NOTE — Progress Notes (Signed)
Pt has had bilateral hip replacement with metal implants. Maw

## 2011-12-25 NOTE — Patient Instructions (Addendum)
Impressions/Recommendations:  [place]   Diverticulosis (handout given) High Fiber Diet (handout given)  Repeat colonoscopy in 5 years.  Resume medications as you were taking them prior to your procedure. Please add metamucil to your medications daily.  YOU HAD AN ENDOSCOPIC PROCEDURE TODAY AT [facility]: Refer to the procedure report that was given to you for any specific questions about what was found during the examination.  If the procedure report does not answer your questions, please call your gastroenterologist to clarify.  If you requested that your care partner not be given the details of your procedure findings, then the procedure report has been included in a sealed envelope for you to review at your convenience later.  YOU SHOULD EXPECT: Some feelings of bloating in the abdomen. Passage of more gas than usual.  Walking can help get rid of the air that was put into your GI tract during the procedure and reduce the bloating. If you had a lower endoscopy (such as a colonoscopy or flexible sigmoidoscopy) you may notice spotting of blood in your stool or on the toilet paper. If you underwent a bowel prep for your procedure, then you may not have a normal bowel movement for a few days.  DIET: Your first meal following the procedure should be a light meal and then it is ok to progress to your normal diet.  A half-sandwich or bowl of soup is an example of a good first meal.  Heavy or fried foods are harder to digest and may make you feel nauseous or bloated.  Likewise meals heavy in dairy and vegetables can cause extra gas to form and this can also increase the bloating.  Drink plenty of fluids but you should avoid alcoholic beverages for 24 hours.  ACTIVITY: Your care partner should take you home directly after the procedure.  You should plan to take it easy, moving slowly for the rest of the day.  You can resume normal activity the day after the procedure however you should NOT  DRIVE or use heavy machinery for 24 hours (because of the sedation medicines used during the test).    SYMPTOMS TO REPORT IMMEDIATELY: A gastroenterologist can be reached at any hour.  During normal business hours, 8:30 AM to 5:00 PM Monday through Friday, call [phone].  After hours and on weekends, please call the GI answering service at [phone] who will take a message and have the physician on call contact you.   Following lower endoscopy (colonoscopy or flexible sigmoidoscopy):  Excessive amounts of blood in the stool  Significant tenderness or worsening of abdominal pains  Swelling of the abdomen that is new, acute  Fever of 100F or higher  Following upper endoscopy (EGD)  Vomiting of blood or coffee ground material  New chest pain or pain under the shoulder blades  Painful or persistently difficult swallowing  New shortness of breath  Fever of 100F or higher  Black, tarry-looking stools  FOLLOW UP: If any biopsies were taken you will be contacted by phone or by letter within the next 1-3 weeks.  Call your gastroenterologist if you have not heard about the biopsies in 3 weeks.  Our staff will call the home number listed on your records the next business day following your procedure to check on you and address any questions or concerns that you may have at that time regarding the information given to you following your procedure. This is a courtesy call and so if there is  no answer at the home number and we have not heard from you through the emergency physician on call, we will assume that you have returned to your regular daily activities without incident.  SIGNATURES/CONFIDENTIALITY: You and/or your care partner have signed paperwork which will be entered into your electronic medical record.  These signatures attest to the fact that that the information above on your After Visit Summary has been reviewed and is understood.  Full responsibility of the confidentiality of  this discharge information lies with you and/or your care-partner.

## 2011-12-26 ENCOUNTER — Telehealth: Payer: Self-pay | Admitting: *Deleted

## 2011-12-26 NOTE — Telephone Encounter (Signed)
  Follow up Call-  Call back number 12/25/2011  Post procedure Call Back phone  # [phone] hm  Permission to leave phone message Yes     Patient questions:  Do you have a fever, pain , or abdominal swelling? no Pain Score  0 *  Have you tolerated food without any problems? yes  Have you been able to return to your normal activities? yes  Do you have any questions about your discharge instructions: Diet   no Medications  no Follow up visit  no  Do you have questions or concerns about your Care? no  Actions: * If pain score is 4 or above: No action needed, pain <4.

## 2012-04-30 ENCOUNTER — Other Ambulatory Visit (HOSPITAL_COMMUNITY): Payer: Self-pay | Admitting: Internal Medicine

## 2012-04-30 DIAGNOSIS — Z1231 Encounter for screening mammogram for malignant neoplasm of breast: Secondary | ICD-10-CM

## 2012-06-10 ENCOUNTER — Ambulatory Visit (HOSPITAL_COMMUNITY)
Admission: RE | Admit: 2012-06-10 | Discharge: 2012-06-10 | Disposition: A | Discharge status: Home / Self Care | Payer: Medicare Other | Source: Ambulatory Visit | Attending: Internal Medicine | Admitting: Internal Medicine

## 2012-06-10 DIAGNOSIS — Z1231 Encounter for screening mammogram for malignant neoplasm of breast: Secondary | ICD-10-CM | POA: Insufficient documentation

## 2012-07-01 ENCOUNTER — Encounter (INDEPENDENT_AMBULATORY_CARE_PROVIDER_SITE_OTHER): Payer: Medicare Other | Admitting: Ophthalmology

## 2012-07-01 DIAGNOSIS — E11319 Type 2 diabetes mellitus with unspecified diabetic retinopathy without macular edema: Secondary | ICD-10-CM

## 2012-07-01 DIAGNOSIS — H3530 Unspecified macular degeneration: Secondary | ICD-10-CM

## 2012-07-01 DIAGNOSIS — E1165 Type 2 diabetes mellitus with hyperglycemia: Secondary | ICD-10-CM

## 2012-07-01 DIAGNOSIS — E1139 Type 2 diabetes mellitus with other diabetic ophthalmic complication: Secondary | ICD-10-CM

## 2012-07-01 DIAGNOSIS — H35039 Hypertensive retinopathy, unspecified eye: Secondary | ICD-10-CM

## 2012-07-01 DIAGNOSIS — H43819 Vitreous degeneration, unspecified eye: Secondary | ICD-10-CM

## 2012-07-01 DIAGNOSIS — I10 Essential (primary) hypertension: Secondary | ICD-10-CM

## 2012-08-05 DIAGNOSIS — Z947 Corneal transplant status: Secondary | ICD-10-CM | POA: Insufficient documentation

## 2013-05-26 ENCOUNTER — Other Ambulatory Visit (HOSPITAL_COMMUNITY): Payer: Self-pay | Admitting: Internal Medicine

## 2013-05-26 DIAGNOSIS — Z1231 Encounter for screening mammogram for malignant neoplasm of breast: Secondary | ICD-10-CM

## 2013-06-11 ENCOUNTER — Ambulatory Visit (HOSPITAL_COMMUNITY)
Admission: RE | Admit: 2013-06-11 | Discharge: 2013-06-11 | Disposition: A | Discharge status: Home / Self Care | Payer: Medicare Other | Source: Ambulatory Visit | Attending: Internal Medicine | Admitting: Internal Medicine

## 2013-06-11 DIAGNOSIS — Z1231 Encounter for screening mammogram for malignant neoplasm of breast: Secondary | ICD-10-CM | POA: Insufficient documentation

## 2013-07-01 ENCOUNTER — Ambulatory Visit (INDEPENDENT_AMBULATORY_CARE_PROVIDER_SITE_OTHER): Payer: Medicare Other | Admitting: Ophthalmology

## 2013-07-01 DIAGNOSIS — E11319 Type 2 diabetes mellitus with unspecified diabetic retinopathy without macular edema: Secondary | ICD-10-CM

## 2013-07-01 DIAGNOSIS — H35039 Hypertensive retinopathy, unspecified eye: Secondary | ICD-10-CM

## 2013-07-01 DIAGNOSIS — I10 Essential (primary) hypertension: Secondary | ICD-10-CM

## 2013-07-01 DIAGNOSIS — E1139 Type 2 diabetes mellitus with other diabetic ophthalmic complication: Secondary | ICD-10-CM

## 2013-07-01 DIAGNOSIS — H43819 Vitreous degeneration, unspecified eye: Secondary | ICD-10-CM

## 2013-07-01 DIAGNOSIS — H3530 Unspecified macular degeneration: Secondary | ICD-10-CM

## 2013-07-31 ENCOUNTER — Ambulatory Visit: Payer: Medicare Other | Attending: Orthopaedic Surgery | Admitting: Physical Therapy

## 2013-07-31 DIAGNOSIS — IMO0001 Reserved for inherently not codable concepts without codable children: Secondary | ICD-10-CM | POA: Insufficient documentation

## 2013-07-31 DIAGNOSIS — M2560 Stiffness of unspecified joint, not elsewhere classified: Secondary | ICD-10-CM | POA: Insufficient documentation

## 2013-07-31 DIAGNOSIS — R269 Unspecified abnormalities of gait and mobility: Secondary | ICD-10-CM | POA: Insufficient documentation

## 2013-07-31 DIAGNOSIS — M25579 Pain in unspecified ankle and joints of unspecified foot: Secondary | ICD-10-CM | POA: Insufficient documentation

## 2013-07-31 DIAGNOSIS — R262 Difficulty in walking, not elsewhere classified: Secondary | ICD-10-CM | POA: Insufficient documentation

## 2013-08-05 ENCOUNTER — Ambulatory Visit: Payer: Medicare Other | Admitting: Physical Therapy

## 2013-08-06 ENCOUNTER — Ambulatory Visit: Payer: Medicare Other | Admitting: Physical Therapy

## 2013-08-11 ENCOUNTER — Ambulatory Visit: Payer: Medicare Other | Admitting: Physical Therapy

## 2013-08-13 ENCOUNTER — Ambulatory Visit: Payer: Medicare Other | Admitting: Physical Therapy

## 2013-08-13 ENCOUNTER — Encounter: Payer: Medicare Other | Admitting: Physical Therapy

## 2013-08-18 ENCOUNTER — Ambulatory Visit: Payer: Medicare Other | Admitting: Physical Therapy

## 2013-08-19 ENCOUNTER — Ambulatory Visit: Payer: Medicare Other | Admitting: Physical Therapy

## 2013-08-25 ENCOUNTER — Ambulatory Visit: Payer: Medicare Other | Admitting: Physical Therapy

## 2013-08-27 ENCOUNTER — Ambulatory Visit: Payer: Medicare Other | Admitting: Physical Therapy

## 2013-09-01 ENCOUNTER — Ambulatory Visit: Payer: Medicare HMO | Attending: Orthopaedic Surgery | Admitting: Rehabilitation

## 2013-09-01 DIAGNOSIS — R269 Unspecified abnormalities of gait and mobility: Secondary | ICD-10-CM | POA: Diagnosis not present

## 2013-09-01 DIAGNOSIS — M25579 Pain in unspecified ankle and joints of unspecified foot: Secondary | ICD-10-CM | POA: Diagnosis not present

## 2013-09-01 DIAGNOSIS — IMO0001 Reserved for inherently not codable concepts without codable children: Secondary | ICD-10-CM | POA: Diagnosis present

## 2013-09-01 DIAGNOSIS — R262 Difficulty in walking, not elsewhere classified: Secondary | ICD-10-CM | POA: Diagnosis not present

## 2013-09-01 DIAGNOSIS — M2560 Stiffness of unspecified joint, not elsewhere classified: Secondary | ICD-10-CM | POA: Diagnosis not present

## 2013-09-04 ENCOUNTER — Ambulatory Visit: Payer: Medicare HMO | Admitting: Rehabilitation

## 2013-09-04 ENCOUNTER — Encounter: Payer: Medicare Other | Admitting: Physical Therapy

## 2013-09-04 DIAGNOSIS — IMO0001 Reserved for inherently not codable concepts without codable children: Secondary | ICD-10-CM | POA: Diagnosis not present

## 2013-09-09 ENCOUNTER — Ambulatory Visit: Payer: Medicare HMO | Admitting: Physical Therapy

## 2013-09-09 DIAGNOSIS — IMO0001 Reserved for inherently not codable concepts without codable children: Secondary | ICD-10-CM | POA: Diagnosis not present

## 2013-09-11 ENCOUNTER — Ambulatory Visit: Payer: Medicare HMO | Admitting: Physical Therapy

## 2013-09-11 DIAGNOSIS — IMO0001 Reserved for inherently not codable concepts without codable children: Secondary | ICD-10-CM | POA: Diagnosis not present

## 2013-09-16 ENCOUNTER — Ambulatory Visit: Payer: Medicare HMO | Admitting: Physical Therapy

## 2013-09-18 ENCOUNTER — Encounter: Payer: Medicare Other | Admitting: Physical Therapy

## 2013-09-23 ENCOUNTER — Ambulatory Visit: Payer: Medicare HMO | Admitting: Physical Therapy

## 2013-09-23 DIAGNOSIS — IMO0001 Reserved for inherently not codable concepts without codable children: Secondary | ICD-10-CM | POA: Diagnosis not present

## 2013-09-25 ENCOUNTER — Ambulatory Visit: Payer: Medicare HMO | Admitting: Physical Therapy

## 2013-09-25 DIAGNOSIS — IMO0001 Reserved for inherently not codable concepts without codable children: Secondary | ICD-10-CM | POA: Diagnosis not present

## 2013-09-29 ENCOUNTER — Ambulatory Visit: Payer: Medicare Other | Attending: Orthopaedic Surgery | Admitting: Physical Therapy

## 2013-09-29 DIAGNOSIS — M2560 Stiffness of unspecified joint, not elsewhere classified: Secondary | ICD-10-CM | POA: Insufficient documentation

## 2013-09-29 DIAGNOSIS — M25579 Pain in unspecified ankle and joints of unspecified foot: Secondary | ICD-10-CM | POA: Insufficient documentation

## 2013-09-29 DIAGNOSIS — R262 Difficulty in walking, not elsewhere classified: Secondary | ICD-10-CM | POA: Insufficient documentation

## 2013-09-29 DIAGNOSIS — R269 Unspecified abnormalities of gait and mobility: Secondary | ICD-10-CM | POA: Insufficient documentation

## 2013-09-29 DIAGNOSIS — IMO0001 Reserved for inherently not codable concepts without codable children: Secondary | ICD-10-CM | POA: Insufficient documentation

## 2013-10-20 ENCOUNTER — Other Ambulatory Visit: Payer: Self-pay | Admitting: *Deleted

## 2013-10-20 ENCOUNTER — Observation Stay (HOSPITAL_COMMUNITY): Payer: Medicare HMO

## 2013-10-20 ENCOUNTER — Encounter (HOSPITAL_COMMUNITY)
Admission: EM | Disposition: A | Discharge status: Rehabilitation Facility | Payer: Self-pay | Source: Home / Self Care | Attending: Thoracic Surgery (Cardiothoracic Vascular Surgery)

## 2013-10-20 ENCOUNTER — Encounter (HOSPITAL_COMMUNITY): Payer: Self-pay | Admitting: Emergency Medicine

## 2013-10-20 ENCOUNTER — Inpatient Hospital Stay (HOSPITAL_COMMUNITY)
Admission: EM | Admit: 2013-10-20 | Discharge: 2013-11-05 | DRG: 233 | Disposition: A | Discharge status: Rehabilitation Facility | Payer: Medicare HMO | Attending: Thoracic Surgery (Cardiothoracic Vascular Surgery) | Admitting: Thoracic Surgery (Cardiothoracic Vascular Surgery)

## 2013-10-20 DIAGNOSIS — I129 Hypertensive chronic kidney disease with stage 1 through stage 4 chronic kidney disease, or unspecified chronic kidney disease: Secondary | ICD-10-CM | POA: Diagnosis present

## 2013-10-20 DIAGNOSIS — E1149 Type 2 diabetes mellitus with other diabetic neurological complication: Secondary | ICD-10-CM | POA: Diagnosis present

## 2013-10-20 DIAGNOSIS — Z86010 Personal history of colonic polyps: Secondary | ICD-10-CM

## 2013-10-20 DIAGNOSIS — I4821 Permanent atrial fibrillation: Secondary | ICD-10-CM | POA: Diagnosis present

## 2013-10-20 DIAGNOSIS — Z87891 Personal history of nicotine dependence: Secondary | ICD-10-CM

## 2013-10-20 DIAGNOSIS — E1142 Type 2 diabetes mellitus with diabetic polyneuropathy: Secondary | ICD-10-CM | POA: Diagnosis present

## 2013-10-20 DIAGNOSIS — Z1211 Encounter for screening for malignant neoplasm of colon: Secondary | ICD-10-CM

## 2013-10-20 DIAGNOSIS — I2589 Other forms of chronic ischemic heart disease: Secondary | ICD-10-CM | POA: Diagnosis present

## 2013-10-20 DIAGNOSIS — I252 Old myocardial infarction: Secondary | ICD-10-CM | POA: Diagnosis present

## 2013-10-20 DIAGNOSIS — Z951 Presence of aortocoronary bypass graft: Secondary | ICD-10-CM

## 2013-10-20 DIAGNOSIS — I10 Essential (primary) hypertension: Secondary | ICD-10-CM

## 2013-10-20 DIAGNOSIS — E118 Type 2 diabetes mellitus with unspecified complications: Secondary | ICD-10-CM | POA: Diagnosis present

## 2013-10-20 DIAGNOSIS — I2510 Atherosclerotic heart disease of native coronary artery without angina pectoris: Secondary | ICD-10-CM

## 2013-10-20 DIAGNOSIS — K5900 Constipation, unspecified: Secondary | ICD-10-CM | POA: Diagnosis not present

## 2013-10-20 DIAGNOSIS — M1990 Unspecified osteoarthritis, unspecified site: Secondary | ICD-10-CM | POA: Diagnosis present

## 2013-10-20 DIAGNOSIS — I480 Paroxysmal atrial fibrillation: Secondary | ICD-10-CM

## 2013-10-20 DIAGNOSIS — E1140 Type 2 diabetes mellitus with diabetic neuropathy, unspecified: Secondary | ICD-10-CM | POA: Insufficient documentation

## 2013-10-20 DIAGNOSIS — D62 Acute posthemorrhagic anemia: Secondary | ICD-10-CM | POA: Diagnosis not present

## 2013-10-20 DIAGNOSIS — E039 Hypothyroidism, unspecified: Secondary | ICD-10-CM | POA: Diagnosis present

## 2013-10-20 DIAGNOSIS — E669 Obesity, unspecified: Secondary | ICD-10-CM | POA: Diagnosis present

## 2013-10-20 DIAGNOSIS — R079 Chest pain, unspecified: Secondary | ICD-10-CM

## 2013-10-20 DIAGNOSIS — I059 Rheumatic mitral valve disease, unspecified: Secondary | ICD-10-CM

## 2013-10-20 DIAGNOSIS — Z96649 Presence of unspecified artificial hip joint: Secondary | ICD-10-CM

## 2013-10-20 DIAGNOSIS — Z01810 Encounter for preprocedural cardiovascular examination: Secondary | ICD-10-CM

## 2013-10-20 DIAGNOSIS — I255 Ischemic cardiomyopathy: Secondary | ICD-10-CM | POA: Diagnosis present

## 2013-10-20 DIAGNOSIS — I249 Acute ischemic heart disease, unspecified: Secondary | ICD-10-CM | POA: Diagnosis present

## 2013-10-20 DIAGNOSIS — N179 Acute kidney failure, unspecified: Secondary | ICD-10-CM | POA: Diagnosis present

## 2013-10-20 DIAGNOSIS — E1169 Type 2 diabetes mellitus with other specified complication: Secondary | ICD-10-CM | POA: Diagnosis present

## 2013-10-20 DIAGNOSIS — I4891 Unspecified atrial fibrillation: Secondary | ICD-10-CM

## 2013-10-20 DIAGNOSIS — I2109 ST elevation (STEMI) myocardial infarction involving other coronary artery of anterior wall: Principal | ICD-10-CM | POA: Diagnosis present

## 2013-10-20 DIAGNOSIS — I509 Heart failure, unspecified: Secondary | ICD-10-CM | POA: Diagnosis present

## 2013-10-20 DIAGNOSIS — N189 Chronic kidney disease, unspecified: Secondary | ICD-10-CM

## 2013-10-20 DIAGNOSIS — I5043 Acute on chronic combined systolic (congestive) and diastolic (congestive) heart failure: Secondary | ICD-10-CM | POA: Diagnosis present

## 2013-10-20 DIAGNOSIS — Z6841 Body Mass Index (BMI) 40.0 and over, adult: Secondary | ICD-10-CM

## 2013-10-20 DIAGNOSIS — I200 Unstable angina: Secondary | ICD-10-CM

## 2013-10-20 DIAGNOSIS — I447 Left bundle-branch block, unspecified: Secondary | ICD-10-CM | POA: Diagnosis present

## 2013-10-20 DIAGNOSIS — E119 Type 2 diabetes mellitus without complications: Secondary | ICD-10-CM

## 2013-10-20 DIAGNOSIS — I501 Left ventricular failure: Secondary | ICD-10-CM

## 2013-10-20 DIAGNOSIS — Z79899 Other long term (current) drug therapy: Secondary | ICD-10-CM

## 2013-10-20 DIAGNOSIS — E785 Hyperlipidemia, unspecified: Secondary | ICD-10-CM

## 2013-10-20 DIAGNOSIS — N183 Chronic kidney disease, stage 3 (moderate): Secondary | ICD-10-CM

## 2013-10-20 DIAGNOSIS — R5381 Other malaise: Secondary | ICD-10-CM | POA: Diagnosis present

## 2013-10-20 DIAGNOSIS — R609 Edema, unspecified: Secondary | ICD-10-CM

## 2013-10-20 HISTORY — DX: Ischemic cardiomyopathy: I25.5

## 2013-10-20 HISTORY — DX: Type 2 diabetes mellitus with unspecified complications: E11.8

## 2013-10-20 HISTORY — DX: Atherosclerotic heart disease of native coronary artery without angina pectoris: I25.10

## 2013-10-20 HISTORY — DX: Type 2 diabetes mellitus with diabetic neuropathy, unspecified: E11.40

## 2013-10-20 HISTORY — DX: Left bundle-branch block, unspecified: I44.7

## 2013-10-20 HISTORY — DX: Unspecified osteoarthritis, unspecified site: M19.90

## 2013-10-20 HISTORY — DX: ST elevation (STEMI) myocardial infarction involving other coronary artery of anterior wall: I21.09

## 2013-10-20 HISTORY — DX: Morbid (severe) obesity due to excess calories: E66.01

## 2013-10-20 HISTORY — DX: Presence of aortocoronary bypass graft: Z95.1

## 2013-10-20 HISTORY — PX: LEFT HEART CATHETERIZATION WITH CORONARY ANGIOGRAM: SHX5451

## 2013-10-20 HISTORY — DX: Chronic kidney disease, unspecified: N18.9

## 2013-10-20 HISTORY — DX: Unspecified atrial fibrillation: I48.91

## 2013-10-20 HISTORY — DX: Left ventricular failure, unspecified: I50.1

## 2013-10-20 LAB — URINALYSIS, ROUTINE W REFLEX MICROSCOPIC
Bilirubin Urine: NEGATIVE
GLUCOSE, UA: NEGATIVE mg/dL
Ketones, ur: NEGATIVE mg/dL
Nitrite: NEGATIVE
PH: 5 (ref 5.0–8.0)
PROTEIN: 30 mg/dL — AB
Specific Gravity, Urine: 1.018 (ref 1.005–1.030)
Urobilinogen, UA: 0.2 mg/dL (ref 0.0–1.0)

## 2013-10-20 LAB — CK TOTAL AND CKMB (NOT AT ARMC)
CK, MB: 68.1 ng/mL (ref 0.3–4.0)
RELATIVE INDEX: 10.6 — AB (ref 0.0–2.5)
Total CK: 645 U/L — ABNORMAL HIGH (ref 7–177)

## 2013-10-20 LAB — BASIC METABOLIC PANEL
BUN: 26 mg/dL — AB (ref 6–23)
CALCIUM: 10.1 mg/dL (ref 8.4–10.5)
CO2: 24 meq/L (ref 19–32)
CREATININE: 1.5 mg/dL — AB (ref 0.50–1.10)
Chloride: 100 mEq/L (ref 96–112)
GFR calc Af Amer: 39 mL/min — ABNORMAL LOW (ref >90)
GFR, EST NON AFRICAN AMERICAN: 34 mL/min — AB (ref >90)
GLUCOSE: 195 mg/dL — AB (ref 70–99)
Potassium: 4.5 mEq/L (ref 3.7–5.3)
Sodium: 139 mEq/L (ref 137–147)

## 2013-10-20 LAB — CARBOXYHEMOGLOBIN
Carboxyhemoglobin: 1.1 % (ref 0.5–1.5)
Methemoglobin: 1.2 % (ref 0.0–1.5)
O2 Saturation: 59.6 %
TOTAL HEMOGLOBIN: 11.9 g/dL — AB (ref 12.0–16.0)

## 2013-10-20 LAB — URINE MICROSCOPIC-ADD ON

## 2013-10-20 LAB — POCT I-STAT 3, ART BLOOD GAS (G3+)
Acid-base deficit: 4 mmol/L — ABNORMAL HIGH (ref 0.0–2.0)
BICARBONATE: 21.6 meq/L (ref 20.0–24.0)
O2 Saturation: 89 %
PH ART: 7.347 — AB (ref 7.350–7.450)
TCO2: 23 mmol/L (ref 0–100)
pCO2 arterial: 39.4 mmHg (ref 35.0–45.0)
pO2, Arterial: 59 mmHg — ABNORMAL LOW (ref 80.0–100.0)

## 2013-10-20 LAB — PULMONARY FUNCTION TEST
FEF 25-75 Pre: 0.27 L/sec
FEF2575-%Pred-Pre: 18 %
FEV1-%Pred-Pre: 28 %
FEV1-Pre: 0.45 L
FEV1FVC-%Pred-Pre: 82 %
FEV6-%PRED-PRE: 36 %
FEV6-Pre: 0.71 L
FEV6FVC-%PRED-PRE: 104 %
FVC-%Pred-Pre: 34 %
FVC-Pre: 0.71 L
PRE FEV6/FVC RATIO: 100 %
Pre FEV1/FVC ratio: 63 %

## 2013-10-20 LAB — CBC
HCT: 33.1 % — ABNORMAL LOW (ref 36.0–46.0)
HEMOGLOBIN: 11.1 g/dL — AB (ref 12.0–15.0)
MCH: 27.2 pg (ref 26.0–34.0)
MCHC: 33.5 g/dL (ref 30.0–36.0)
MCV: 81.1 fL (ref 78.0–100.0)
Platelets: 288 10*3/uL (ref 150–400)
RBC: 4.08 MIL/uL (ref 3.87–5.11)
RDW: 14.9 % (ref 11.5–15.5)
WBC: 10.1 10*3/uL (ref 4.0–10.5)

## 2013-10-20 LAB — LIPID PANEL
Cholesterol: 147 mg/dL (ref 0–200)
HDL: 89 mg/dL (ref >39)
LDL Cholesterol: 50 mg/dL (ref 0–99)
Total CHOL/HDL Ratio: 1.7 RATIO
Triglycerides: 42 mg/dL (ref <150)
VLDL: 8 mg/dL (ref 0–40)

## 2013-10-20 LAB — POCT I-STAT 3, VENOUS BLOOD GAS (G3P V)
ACID-BASE DEFICIT: 4 mmol/L — AB (ref 0.0–2.0)
Bicarbonate: 22.3 mEq/L (ref 20.0–24.0)
O2 Saturation: 49 %
PH VEN: 7.324 — AB (ref 7.250–7.300)
TCO2: 24 mmol/L (ref 0–100)
pCO2, Ven: 42.8 mmHg — ABNORMAL LOW (ref 45.0–50.0)
pO2, Ven: 29 mmHg — CL (ref 30.0–45.0)

## 2013-10-20 LAB — MAGNESIUM: Magnesium: 1.7 mg/dL (ref 1.5–2.5)

## 2013-10-20 LAB — HEMOGLOBIN A1C
HEMOGLOBIN A1C: 6 % — AB (ref <5.7)
Hgb A1c MFr Bld: 5.7 % — ABNORMAL HIGH (ref <5.7)
MEAN PLASMA GLUCOSE: 126 mg/dL — AB (ref <117)
Mean Plasma Glucose: 117 mg/dL — ABNORMAL HIGH (ref <117)

## 2013-10-20 LAB — PROTIME-INR
INR: 1.19 (ref 0.00–1.49)
INR: 1.04 (ref 0.00–1.49)
PROTHROMBIN TIME: 13.4 s (ref 11.6–15.2)
Prothrombin Time: 14.8 seconds (ref 11.6–15.2)

## 2013-10-20 LAB — COMPREHENSIVE METABOLIC PANEL
ALT: 20 U/L (ref 0–35)
AST: 55 U/L — ABNORMAL HIGH (ref 0–37)
Albumin: 3.6 g/dL (ref 3.5–5.2)
Alkaline Phosphatase: 68 U/L (ref 39–117)
BUN: 27 mg/dL — AB (ref 6–23)
CO2: 20 meq/L (ref 19–32)
CREATININE: 1.52 mg/dL — AB (ref 0.50–1.10)
Calcium: 9.9 mg/dL (ref 8.4–10.5)
Chloride: 100 mEq/L (ref 96–112)
GFR, EST AFRICAN AMERICAN: 38 mL/min — AB (ref >90)
GFR, EST NON AFRICAN AMERICAN: 33 mL/min — AB (ref >90)
GLUCOSE: 219 mg/dL — AB (ref 70–99)
Potassium: 3.8 mEq/L (ref 3.7–5.3)
Sodium: 138 mEq/L (ref 137–147)
TOTAL PROTEIN: 7.7 g/dL (ref 6.0–8.3)
Total Bilirubin: 0.3 mg/dL (ref 0.3–1.2)

## 2013-10-20 LAB — GLUCOSE, CAPILLARY
Glucose-Capillary: 188 mg/dL — ABNORMAL HIGH (ref 70–99)
Glucose-Capillary: 158 mg/dL — ABNORMAL HIGH (ref 70–99)

## 2013-10-20 LAB — TROPONIN I
Troponin I: 15.33 ng/mL (ref <0.30)
Troponin I: >20 ng/mL (ref <0.30)

## 2013-10-20 LAB — POCT I-STAT, CHEM 8
BUN: 26 mg/dL — ABNORMAL HIGH (ref 6–23)
CREATININE: 1.4 mg/dL — AB (ref 0.50–1.10)
Calcium, Ion: 1.3 mmol/L (ref 1.13–1.30)
Chloride: 100 mEq/L (ref 96–112)
GLUCOSE: 210 mg/dL — AB (ref 70–99)
HEMATOCRIT: 37 % (ref 36.0–46.0)
HEMOGLOBIN: 12.6 g/dL (ref 12.0–15.0)
POTASSIUM: 3.7 meq/L (ref 3.7–5.3)
Sodium: 136 mEq/L — ABNORMAL LOW (ref 137–147)
TCO2: 20 mmol/L (ref 0–100)

## 2013-10-20 LAB — MRSA PCR SCREENING: MRSA by PCR: NEGATIVE

## 2013-10-20 LAB — APTT: aPTT: 143 seconds — ABNORMAL HIGH (ref 24–37)

## 2013-10-20 LAB — TSH: TSH: 2.351 u[IU]/mL (ref 0.350–4.500)

## 2013-10-20 LAB — PRO B NATRIURETIC PEPTIDE: Pro B Natriuretic peptide (BNP): 3566 pg/mL — ABNORMAL HIGH (ref 0–125)

## 2013-10-20 SURGERY — LEFT HEART CATHETERIZATION WITH CORONARY ANGIOGRAM
Anesthesia: LOCAL

## 2013-10-20 MED ORDER — SODIUM CHLORIDE 0.9 % IJ SOLN
3.0000 mL | Freq: Two times a day (BID) | INTRAMUSCULAR | Status: DC
Start: 2013-10-20 — End: 2013-10-23
  Administered 2013-10-20: 3 mL via INTRAVENOUS

## 2013-10-20 MED ORDER — MORPHINE SULFATE 2 MG/ML IJ SOLN
2.0000 mg | INTRAMUSCULAR | Status: DC | PRN
  Administered 2013-10-22: 2 mg via INTRAVENOUS
  Filled 2013-10-20: qty 1

## 2013-10-20 MED ORDER — SODIUM CHLORIDE 0.9 % IV SOLN
INTRAVENOUS | Status: AC
  Administered 2013-10-20: 15:00:00 via INTRAVENOUS

## 2013-10-20 MED ORDER — NITROGLYCERIN IN D5W 200-5 MCG/ML-% IV SOLN
2.0000 ug/min | INTRAVENOUS | Status: DC
  Administered 2013-10-20: 20 ug/min via INTRAVENOUS

## 2013-10-20 MED ORDER — SODIUM CHLORIDE 0.9 % IV SOLN
500.0000 mL | INTRAVENOUS | Status: DC
  Administered 2013-10-20: 500 mL via INTRAVENOUS
  Administered 2013-10-23: 12:00:00 via INTRAVENOUS

## 2013-10-20 MED ORDER — SODIUM CHLORIDE 0.9 % IV SOLN: 250.0000 mL | INTRAVENOUS | Status: DC | PRN

## 2013-10-20 MED ORDER — SODIUM CHLORIDE 0.9 % IV SOLN: INTRAVENOUS | Status: AC

## 2013-10-20 MED ORDER — SODIUM CHLORIDE 0.9 % IV SOLN
250.0000 mL | INTRAVENOUS | Status: DC | PRN
  Administered 2013-10-22: 10 mL/h via INTRAVENOUS

## 2013-10-20 MED ORDER — SODIUM CHLORIDE 0.9 % IJ SOLN
3.0000 mL | INTRAMUSCULAR | Status: DC | PRN
Start: 2013-10-20 — End: 2013-10-23

## 2013-10-20 MED ORDER — SODIUM CHLORIDE 0.9 % IJ SOLN: 3.0000 mL | INTRAMUSCULAR | Status: DC | PRN

## 2013-10-20 MED ORDER — OMEGA-3 FATTY ACIDS 1000 MG PO CAPS: 2.0000 g | ORAL_CAPSULE | Freq: Every day | ORAL | Status: DC

## 2013-10-20 MED ORDER — BENAZEPRIL HCL 40 MG PO TABS
40.0000 mg | ORAL_TABLET | Freq: Every day | ORAL | Status: DC
  Administered 2013-10-20 – 2013-10-21 (×2): 40 mg via ORAL
  Filled 2013-10-20 (×3): qty 1

## 2013-10-20 MED ORDER — METOPROLOL TARTRATE 25 MG PO TABS
25.0000 mg | ORAL_TABLET | Freq: Two times a day (BID) | ORAL | Status: DC
Start: 2013-10-20 — End: 2013-10-23
  Administered 2013-10-20 – 2013-10-21 (×2): 25 mg via ORAL
  Filled 2013-10-20 (×7): qty 1

## 2013-10-20 MED ORDER — HEPARIN (PORCINE) IN NACL 100-0.45 UNIT/ML-% IJ SOLN
1000.0000 [IU]/h | INTRAMUSCULAR | Status: DC
  Filled 2013-10-20: qty 250

## 2013-10-20 MED ORDER — FENTANYL CITRATE 0.05 MG/ML IJ SOLN
INTRAMUSCULAR | Status: AC
  Filled 2013-10-20: qty 2

## 2013-10-20 MED ORDER — HEPARIN SODIUM (PORCINE) 1000 UNIT/ML IJ SOLN
INTRAMUSCULAR | Status: AC
  Filled 2013-10-20: qty 1

## 2013-10-20 MED ORDER — NITROGLYCERIN IN D5W 200-5 MCG/ML-% IV SOLN
INTRAVENOUS | Status: AC
  Filled 2013-10-20: qty 250

## 2013-10-20 MED ORDER — INSULIN ASPART 100 UNIT/ML ~~LOC~~ SOLN
0.0000 [IU] | Freq: Three times a day (TID) | SUBCUTANEOUS | Status: DC
  Administered 2013-10-21 – 2013-10-22 (×4): 3 [IU] via SUBCUTANEOUS

## 2013-10-20 MED ORDER — ASPIRIN EC 81 MG PO TBEC: 81.0000 mg | DELAYED_RELEASE_TABLET | Freq: Every day | ORAL | Status: DC

## 2013-10-20 MED ORDER — MIDAZOLAM HCL 2 MG/2ML IJ SOLN
INTRAMUSCULAR | Status: AC
  Filled 2013-10-20: qty 2

## 2013-10-20 MED ORDER — NITROGLYCERIN 0.4 MG SL SUBL: 0.4000 mg | SUBLINGUAL_TABLET | SUBLINGUAL | Status: DC | PRN

## 2013-10-20 MED ORDER — LEVOTHYROXINE SODIUM 112 MCG PO TABS
112.0000 ug | ORAL_TABLET | Freq: Every day | ORAL | Status: DC
  Administered 2013-10-21 – 2013-11-05 (×15): 112 ug via ORAL
  Filled 2013-10-20 (×18): qty 1

## 2013-10-20 MED ORDER — SODIUM CHLORIDE 0.9 % IJ SOLN
3.0000 mL | Freq: Two times a day (BID) | INTRAMUSCULAR | Status: DC
  Administered 2013-10-20 (×2): 3 mL via INTRAVENOUS

## 2013-10-20 MED ORDER — HEPARIN (PORCINE) IN NACL 100-0.45 UNIT/ML-% IJ SOLN
1200.0000 [IU]/h | INTRAMUSCULAR | Status: DC
  Administered 2013-10-20: 1000 [IU]/h via INTRAVENOUS
  Administered 2013-10-21 – 2013-10-22 (×2): 1200 [IU]/h via INTRAVENOUS
  Filled 2013-10-20 (×4): qty 250

## 2013-10-20 MED ORDER — ATORVASTATIN CALCIUM 20 MG PO TABS
20.0000 mg | ORAL_TABLET | Freq: Every day | ORAL | Status: DC
  Administered 2013-10-20 – 2013-10-22 (×3): 20 mg via ORAL
  Filled 2013-10-20 (×5): qty 1

## 2013-10-20 MED ORDER — METOPROLOL TARTRATE 1 MG/ML IV SOLN
INTRAVENOUS | Status: AC
  Filled 2013-10-20: qty 5

## 2013-10-20 MED ORDER — ASPIRIN EC 325 MG PO TBEC
325.0000 mg | DELAYED_RELEASE_TABLET | Freq: Every day | ORAL | Status: DC
  Administered 2013-10-20 – 2013-10-22 (×3): 325 mg via ORAL
  Filled 2013-10-20 (×4): qty 1

## 2013-10-20 MED ORDER — VERAPAMIL HCL 2.5 MG/ML IV SOLN
INTRAVENOUS | Status: AC
  Filled 2013-10-20: qty 2

## 2013-10-20 MED ORDER — OMEGA-3-ACID ETHYL ESTERS 1 G PO CAPS
2.0000 g | ORAL_CAPSULE | Freq: Every day | ORAL | Status: DC
  Administered 2013-10-20 – 2013-10-22 (×3): 2 g via ORAL
  Filled 2013-10-20 (×4): qty 2

## 2013-10-20 MED ORDER — LIDOCAINE HCL (PF) 1 % IJ SOLN
INTRAMUSCULAR | Status: AC
  Filled 2013-10-20: qty 30

## 2013-10-20 MED ORDER — SODIUM CHLORIDE 0.9 % IJ SOLN
3.0000 mL | Freq: Two times a day (BID) | INTRAMUSCULAR | Status: DC
Start: 2013-10-20 — End: 2013-10-23

## 2013-10-20 MED ORDER — FUROSEMIDE 10 MG/ML IJ SOLN
60.0000 mg | Freq: Two times a day (BID) | INTRAMUSCULAR | Status: DC
Start: 2013-10-20 — End: 2013-10-22
  Administered 2013-10-20 – 2013-10-21 (×3): 60 mg via INTRAVENOUS
  Filled 2013-10-20 (×5): qty 6

## 2013-10-20 MED ORDER — FUROSEMIDE 10 MG/ML IJ SOLN
INTRAMUSCULAR | Status: AC
  Administered 2013-10-20: 60 mg via INTRAVENOUS
  Filled 2013-10-20: qty 4

## 2013-10-20 MED ORDER — NITROGLYCERIN 0.2 MG/ML ON CALL CATH LAB
INTRAVENOUS | Status: AC
  Filled 2013-10-20: qty 1

## 2013-10-20 MED ORDER — HEPARIN (PORCINE) IN NACL 2-0.9 UNIT/ML-% IJ SOLN
INTRAMUSCULAR | Status: AC
  Filled 2013-10-20: qty 1500

## 2013-10-20 NOTE — ED Notes (Signed)
Pt will going to the cath lab, room 3.

## 2013-10-20 NOTE — Consult Note (Signed)
[address]       [place] [address]             [phone]          CARDIOTHORACIC SURGERY CONSULTATION REPORT  PCP is Marton Redwood, MD Referring Provider is Leonie Man, MD   Reason for consultation:  Left main disease with 3-vessel CAD  HPI:  Patient is a 73 year old morbidly obese African American female from [place] with no previous cardiac history but multiple risk factors including history of long-standing hypertension, type 2 diabetes mellitus with complication, hyperlipidemia, and remote tobacco abuse who reports a six-week history of progressive substernal chest pressure and shortness of breath. These symptoms have for the most part been worse following meals and at night while the patient has been trying to sleep, and at times the patient felt that belching would alleviate the substernal chest pressure. Symptoms were initially attributed to GE reflux and the patient has been treated with antiacids therapies with minimal effectiveness. This past weekend symptoms became acutely worse and progressive until early this morning the patient awoke with severe resting shortness of breath and orthopnea.  EMS was called and baseline electrocardiogram revealed atrial fibrillation with left bundle branch block. There were some ST segment abnormalities felt concerning for possible STEMI, and a code STEMI was called.  The patient was taken directly to the cardiac Cath Lab by Dr. Ellyn Hack where catheterization demonstrates moderate left main coronary artery stenosis with high grade proximal stenosis of the left anterior descending coronary artery and severe three-vessel coronary artery disease. Left ventricular function was not assessed initially at cath but subsequent transthoracic echocardiogram was performed demonstrating severe global left ventricular dysfunction with severe anterior and apical hypokinesis or akinesis.  The patient's symptoms of chest discomfort and  shortness of breath had been dramatically alleviated with intravenous heparin and nitroglycerin. Cardiac thoracic surgical consultation was requested.  The patient describes more than 6 week history of accelerating symptoms of substernal chest pressure and shortness of breath consistent with unstable angina. The symptoms have been typically worse following meals and at night, and the patient has not been able to live flat in bed for several weeks. Symptoms became acutely worse 3 days ago and it progressed over the past weekend until early this morning when the patient developed respiratory distress. Prior to 6 weeks ago the patient describes any previous history of substernal chest discomfort. She lives at home with her husband who is elderly and requires considerable care. She is somewhat limited because of her morbid obesity and problems with chronic "tendinitis" in her left ankle. She ambulates limited distances using a cane. She still drives an automobile.  Up until recently she has been able to tend to all of her usual ADLs independently.   Past Medical History  Diagnosis Date  . Arthritis   . Diabetes mellitus   . Heart murmur   . Hyperlipidemia   . Hypertension   . Hypothyroidism   . NSTEMI (non-ST elevated myocardial infarction) 10/20/2013  . Ischemic cardiomyopathy 10/20/2013  . Acute pulmonary edema with congestive heart failure 10/20/2013  . Left bundle branch block (LBBB) on electrocardiogram   . Atrial fibrillation 10/20/2013  . OSTEOARTHRITIS 08/06/2006  . Morbid obesity   . Diabetic neuropathy   . Type II diabetes mellitus with complication [id number]  . Chronic kidney disease     Past Surgical History  Procedure Laterality Date  . Corneal transplant  2011    right  eye  . Total hip arthroplasty  2010, 2011    right 2010, left 2011  . Abdominal hysterectomy      Family History  Problem Relation Age of Onset  . Colon cancer Neg Hx   . Esophageal cancer Neg Hx   . Rectal  cancer Neg Hx   . Stomach cancer Neg Hx     History   Social History  . Marital Status: Married    Spouse Name: N/A    Number of Children: N/A  . Years of Education: N/A   Occupational History  . retired    Social History Main Topics  . Smoking status: Former Smoker    Quit date: 08/28/1993  . Smokeless tobacco: Never Used  . Alcohol Use: No  . Drug Use: No  . Sexual Activity: Not on file   Other Topics Concern  . Not on file   Social History Narrative   Lives with husband and son in [place]    Prior to Admission medications   Medication Sig Start Date End Date Taking? Authorizing Provider  amLODipine (NORVASC) 5 MG tablet Take 5 mg by mouth daily.    Historical Provider, MD  benazepril (LOTENSIN) 40 MG tablet Take 40 mg by mouth daily.    Historical Provider, MD  CRESTOR 20 MG tablet 10 mg.  10/23/11   Historical Provider, MD  Exenatide (BYDUREON) 2 MG SUSR Inject into the skin once a week.    Historical Provider, MD  fish oil-omega-3 fatty acids 1000 MG capsule Take 2 g by mouth daily.    Historical Provider, MD  furosemide (LASIX) 40 MG tablet Take 40 mg by mouth daily.    Historical Provider, MD  glimepiride (AMARYL) 2 MG tablet Take 2 mg by mouth daily before breakfast.    Historical Provider, MD  Glucosamine-Chondroit-Vit C-Mn (GLUCOSAMINE CHONDR 1500 COMPLX PO) Take 2 tablets by mouth daily.    Historical Provider, MD  levothyroxine (SYNTHROID, LEVOTHROID) 112 MCG tablet Take 112 mcg by mouth daily.    Historical Provider, MD  metFORMIN (GLUCOPHAGE) 1000 MG tablet  12/09/11   Historical Provider, MD  Multiple Vitamins-Minerals (MULTIVITAMIN PO) Take 1 tablet by mouth daily.    Historical Provider, MD  naproxen sodium (ANAPROX) 220 MG tablet Take 220 mg by mouth 2 (two) times daily with a meal.    Historical Provider, MD    Current Facility-Administered Medications  Medication Dose Route Frequency Provider Last Rate Last Dose  . 0.9 %  sodium chloride infusion   500 mL Intravenous Continuous Perry Mount, PA-C 20 mL/hr at 10/20/13 1500 500 mL at 10/20/13 1500  . 0.9 %  sodium chloride infusion  250 mL Intravenous PRN Perry Mount, PA-C      . 0.9 %  sodium chloride infusion  250 mL Intravenous PRN Leonie Man, MD      . 0.9 %  sodium chloride infusion   Intravenous Continuous Leonie Man, MD 75 mL/hr at 10/20/13 1500    . aspirin EC tablet 325 mg  325 mg Oral Daily Leonie Man, MD   325 mg at 10/20/13 1712  . atorvastatin (LIPITOR) tablet 20 mg  20 mg Oral q1800 Perry Mount, PA-C   20 mg at 10/20/13 1715  . benazepril (LOTENSIN) tablet 40 mg  40 mg Oral Daily Perry Mount, PA-C   40 mg at 10/20/13 1713  . furosemide (LASIX) injection 60 mg  60 mg Intravenous BID Leonie Man, MD   60 mg  at 10/20/13 1713  . insulin aspart (novoLOG) injection 0-20 Units  0-20 Units Subcutaneous TID WC Perry Mount, PA-C      . Derrill Memo ON 10/21/2013] levothyroxine (SYNTHROID, LEVOTHROID) tablet 112 mcg  112 mcg Oral QAC breakfast Perry Mount, PA-C      . metoprolol tartrate (LOPRESSOR) tablet 25 mg  25 mg Oral BID Leonie Man, MD   25 mg at 10/20/13 1715  . morphine 2 MG/ML injection 2 mg  2 mg Intravenous Q1H PRN Leonie Man, MD      . nitroGLYCERIN (NITROSTAT) SL tablet 0.4 mg  0.4 mg Sublingual Q5 Min x 3 PRN Perry Mount, PA-C      . nitroGLYCERIN 0.2 mg/mL in dextrose 5 % infusion  2-200 mcg/min Intravenous Titrated Leonie Man, MD 9 mL/hr at 10/20/13 1800 30 mcg/min at 10/20/13 1800  . omega-3 acid ethyl esters (LOVAZA) capsule 2 g  2 g Oral Daily Leonie Man, MD   2 g at 10/20/13 1714  . sodium chloride 0.9 % injection 3 mL  3 mL Intravenous Q12H Perry Mount, PA-C   3 mL at 10/20/13 1502  . sodium chloride 0.9 % injection 3 mL  3 mL Intravenous PRN Perry Mount, PA-C      . sodium chloride 0.9 % injection 3 mL  3 mL Intravenous Q12H Leonie Man, MD   3 mL at 10/20/13 1502  . sodium chloride 0.9 % injection 3 mL  3 mL  Intravenous PRN Leonie Man, MD        Allergies  Allergen Reactions  . Tape Rash      Review of Systems:   General:  decreased appetite, decreased energy, no weight gain, no weight loss, + fever last night  Cardiac:  + chest pain with exertion, + chest pain at rest, + SOB with exertion, + resting SOB, + PND, + orthopnea, + palpitations, + arrhythmia, + atrial fibrillation, + LE edema, no dizzy spells, no syncope  Respiratory:  + shortness of breath, no home oxygen, + productive cough, + dry cough, no bronchitis, + wheezing, no hemoptysis, no asthma, no pain with inspiration or cough, no sleep apnea, no CPAP at night  GI:   no difficulty swallowing, ? reflux, no frequent heartburn, no hiatal hernia, no abdominal pain, no constipation, no diarrhea, no hematochezia, no hematemesis, no melena  GU:   no dysuria,  + frequency, no urinary tract infection, no hematuria, no kidney stones, + chronic kidney disease  Vascular:  no pain suggestive of claudication, + pain in feet, no leg cramps, no varicose veins, no DVT, no non-healing foot ulcer  Neuro:   no stroke, no TIA's, no seizures, no headaches, + temporary blindness one eye,  no slurred speech, + peripheral neuropathy, no chronic pain, mild instability of gait, no memory/cognitive dysfunction  Musculoskeletal: + arthritis, no joint swelling, no myalgias, mild difficulty walking, somewhat limited mobility   Skin:   no rash, no itching, no skin infections, no pressure sores or ulcerations  Psych:   no anxiety, no depression, no nervousness, no unusual recent stress  Eyes:   + blurry vision, no floaters, no recent vision changes, no wears glasses or contacts  ENT:   no hearing loss, + loose or painful teeth, poor dentition  Hematologic:  no easy bruising, no abnormal bleeding, no clotting disorder, no frequent epistaxis  Endocrine:  + diabetes, routinely checks CBG's at home and reports Hgb a1c < 6  Physical Exam:   BP 115/77  Pulse  101  Temp(Src) 98.1 F (36.7 C) (Core (Comment))  Resp 19  SpO2 97%  General:  Morbidly obese female in no acute distress  HEENT:  Unremarkable other than poor dentition  Neck:   no JVD, no bruits, no adenopathy   Chest:   Scattered rales and rhonchi both lungs, no wheezing  CV:   Irregular rate and rhythm, no murmur   Abdomen:  soft, non-tender, no masses   Extremities:  warm, well-perfused, pulses not palpable, + mild lower extremity edema, there is a arterial and venous sheaths in the right groin with pulmonary artery catheter in place.  Rectal/GU  Deferred  Neuro:   Grossly non-focal and symmetrical throughout  Skin:   Clean and dry, no rashes, no breakdown  Diagnostic Tests:  CARDIAC CATHETERIZATION  Images from cardiac catheterization performed earlier today are reviewed. Official report from this test is not currently available. The patient has left main disease with diffuse three-vessel coronary artery disease. There is approximately 50% stenosis of the distal left main coronary artery. There is high-grade 98-99% subtotal occlusion of the proximal left anterior descending coronary artery. This vessel is diffusely diseased chronically with appearance suggestive of previous complete occlusion and recanalization. There is high-grade stenosis of a small, diffusely diseased first diagonal branch. There several other small diagonal branches. The left circumflex system is diffusely diseased but free of high-grade proximal stenosis. There is right dominant coronary circulation. There is diffuse disease in the right coronary artery with 70-80% stenosis in several areas. Distal target vessels are diffusely diseased but appear graftable. Left ventricular function was not assessed at catheterization.   ECHOCARDIOGRAM  Images from transthoracic echocardiogram performed this afternoon are reviewed. Official report from this test is not currently available.  The patient has severe global left  ventricular dysfunction with severe hypokinesis or akinesis of the distal antrum wall and apex. Visually the ejection fraction appears well below 25%. There does not appear to be significant mitral regurgitation. There does not appear to be significant disease of the aortic valve. There is no significant pericardial effusion.   Impression:  Patient has left main disease with severe three-vessel coronary artery disease and severe left ventricular dysfunction. I suspect that she has had a previous anterior wall myocardial infarction and she now presents with another acute myocardial infarction in the setting of presumably new onset atrial fibrillation, further complicated by acute combined systolic and diastolic congestive heart failure with pulmonary edema. Based upon her coronary anatomy I agree that she would likely benefit from surgical revascularization.  However, risks associated with coronary artery bypass grafting will be extremely high because of the patient's severe left ventricular dysfunction as well as her numerous severe comorbidities.  I think her chances for a reasonable outcome would be enhanced if her acute heart failure could be treated medically prior to surgery, and so far she seems to be stabilizing nicely on IV heparin and nitroglycerine.  If the patient shows signs of recurrent myocardial ischemia refractory to medical therapy I would consider PCI and stenting of the high-grade proximal stenosis of the LAD coronary artery as a temporizing measure.    Plan:  I have reviewed the indications, risks, and potential benefits of coronary artery bypass grafting with the patient and her family at the bedside.  Alternative treatment strategies have been discussed.  We discussed the very high risks associated with surgery as well as my concerns regarding timing given the patient's current presentation  with acutely decompensated congestive heart failure.  The patient understands and accepts all  potential associated risks of surgery including but not limited to risk of death, stroke or other neurologic complication, myocardial infarction, congestive heart failure, respiratory failure, renal failure, bleeding requiring blood transfusion and/or reexploration, aortic dissection or other major vascular complication, arrhythmia, heart block or bradycardia requiring permanent pacemaker, pneumonia, pleural effusion, wound infection, pulmonary embolus or other thromboembolic complication, chronic pain or other delayed complications related to median sternotomy, or the late recurrence of symptomatic ischemic heart disease and/or congestive heart failure.   All questions answered.  I recommend continued intravenous heparin and nitroglycerin with judicious medical therapy of atrial fibrillation and congestive heart failure. Will continue to follow closely.  I spent in excess of 110 minutes during the conduct of this hospital consultation and >50% of this time involved direct face-to-face encounter for counseling and/or coordination of the patient's care.  Valentina Gu. Roxy Manns, MD 10/20/2013 6:49 PM

## 2013-10-20 NOTE — Progress Notes (Signed)
VASCULAR LAB PRELIMINARY  PRELIMINARY  PRELIMINARY  PRELIMINARY  Pre-op Cardiac Surgery  Carotid Findings:  Bilateral:  1-39% ICA stenosis.  Vertebral artery flow is antegrade.      Upper Extremity Right Left  Brachial Pressures IV -triphasic 130 triphasic  Radial Waveforms triphasic   Ulnar Waveforms triphasic   Palmar Arch (Allen's Test) WNL Cardiac cath via radial artery   Findings:  Doppler waveforms remain normal with ulnar and radial compressions on the right.    Lower  Extremity Right Left  Dorsalis Pedis    Anterior Tibial 145 triphasic 152 triphasic  Posterior Tibial 149 triphasic 170 triphasic  Ankle/Brachial Indices >1.0 >1.0    Findings:  ABI is within normal limits bilaterally.  Note:  Pedal blood pressures may be even higher; but, the patient was unable to tolerate the cuff tightening anymore.   Inge Waldroup, RVT 10/20/2013, 5:57 PM

## 2013-10-20 NOTE — H&P (Signed)
Patient ID: Elaine [name] MRN: [id number], DOB/AGE: December 09, 1940   Admit date: 10/20/2013   Primary Physician: Marton Redwood, MD Primary Cardiologist: New   Pt. Profile: Elaine [name] is a 73 y.o. female with a history of diabetes mellitus, HLD, HTN, hypothyroidism, 30 pack year hx of smoking, obesity and no past cardiac history who was brought to [facility] by EMS for chest pain and ECG changes. ECG revealed ST elevation in the setting of new onset atrial fibrillation and a presumed new LBBB; however, last ECG for comparison in 2010. She has been having chest pressure and SOB x 6 weeks that has been progressively worsening. She describes it as a pressure that became a 10/10 this morning. It was brought to a 1/10 after 3 SL NTG. She also describes the feeling of needing to belch that does improve her pressure momentarily. It is not worse with exertion, it does not radiate and she denies diaphoresis or palpitations. She also admits to new LE edema. Patient reports productive cough and subjective fevers for the last couple days. She has no family history of heart disease and is compliant with all medications.  Problem List  Past Medical History  Diagnosis Date  . Arthritis   . Diabetes mellitus   . Heart murmur   . Hyperlipidemia   . Hypertension   . Hypothyroidism     Past Surgical History  Procedure Laterality Date  . Corneal transplant  2011    right eye  . Hip surgery  2010, 2011    right 2010, left 2011     Allergies  Allergies  Allergen Reactions  . Tape Rash    Home Medications  Prior to Admission medications   Medication Sig Start Date End Date Taking? Authorizing Provider  amLODipine (NORVASC) 5 MG tablet Take 5 mg by mouth daily.    Historical Provider, MD  benazepril (LOTENSIN) 40 MG tablet Take 40 mg by mouth daily.    Historical Provider, MD  CRESTOR 20 MG tablet 10 mg.  10/23/11   Historical Provider, MD  Exenatide (BYDUREON) 2 MG SUSR Inject into the skin once a  week.    Historical Provider, MD  fish oil-omega-3 fatty acids 1000 MG capsule Take 2 g by mouth daily.    Historical Provider, MD  furosemide (LASIX) 40 MG tablet Take 40 mg by mouth daily.    Historical Provider, MD  glimepiride (AMARYL) 2 MG tablet Take 2 mg by mouth daily before breakfast.    Historical Provider, MD  Glucosamine-Chondroit-Vit C-Mn (GLUCOSAMINE CHONDR 1500 COMPLX PO) Take 2 tablets by mouth daily.    Historical Provider, MD  levothyroxine (SYNTHROID, LEVOTHROID) 112 MCG tablet Take 112 mcg by mouth daily.    Historical Provider, MD  metFORMIN (GLUCOPHAGE) 1000 MG tablet  12/09/11   Historical Provider, MD  Multiple Vitamins-Minerals (MULTIVITAMIN PO) Take 1 tablet by mouth daily.    Historical Provider, MD  naproxen sodium (ANAPROX) 220 MG tablet Take 220 mg by mouth 2 (two) times daily with a meal.    Historical Provider, MD    Family History  Family History  Problem Relation Age of Onset  . Colon cancer Neg Hx   . Esophageal cancer Neg Hx   . Rectal cancer Neg Hx   . Stomach cancer Neg Hx     Social History  History   Social History  . Marital Status: Married    Spouse Name: N/A    Number of Children: N/A  .  Years of Education: N/A   Occupational History  . Not on file.   Social History Main Topics  . Smoking status: Former Smoker    Quit date: 08/28/1993  . Smokeless tobacco: Never Used  . Alcohol Use: No  . Drug Use: No  . Sexual Activity: Not on file   Other Topics Concern  . Not on file   Social History Narrative  . No narrative on file     All other systems reviewed and are otherwise negative except as noted above.  Physical Exam  There were no vitals taken for this visit.  General: Pleasant, obese and in moderate distress Psych: Normal affect. Neuro: Alert and oriented X 3. Moves all extremities spontaneously. HEENT: Normal  Neck: Supple without bruits or JVD. Lungs:  Resp regular and unlabored, Expiatory wheezes and cough. She is  very SOB. Chest is tender to palpation. Heart: irregularly irregular. no s3, s4, or murmurs. Abdomen: Soft, non-tender, non-distended, BS + x 4.  Extremities: No clubbing, cyanosis. DP/PT/Radials 2+ and equal bilaterally. 2+ pitting edema bilaterally   Labs  NONE   Radiology/Studies  No results found.  ECG  Presumed new LBBB, but last ECG for comparison in 2010.  ASSESSMENT AND PLAN Elaine [name] is a 73 y.o. female with a history of diabetes mellitus, HLD, HTN, hypothyroidism, 30 pack year hx of smoking, obesity and no past cardiac history who was brought to [facility] by EMS for chest pain and ECG changes. ECG revealed ST elevation in the setting of new onset atrial fibrillation and a presumed new LBBB; however, last ECG for comparison in 2010.  PLAN Emergency cardiac catheterization with possible PCI   SignedPerry Mount, PA-C 10/20/2013, 12:19 PM  Pager [phone]

## 2013-10-20 NOTE — ED Provider Notes (Signed)
CSN: [id number]     Arrival date & time 10/20/13  1201 History   None    Chief Complaint  Patient presents with  . Chest Pain     (Consider location/radiation/quality/duration/timing/severity/associated sxs/prior Treatment) Patient is a 73 y.o. female presenting with chest pain. The history is provided by the patient.  Chest Pain Pain location:  Substernal area Associated symptoms: cough and shortness of breath   Associated symptoms: no abdominal pain and no back pain    patient has had chest pressure for the last 6 weeks. Worse the last day or 2. She's been treated for reflux by her primary care doctor without improvement. She has had a cough. She's had worsening shortness of breath. She's had increased difficulty walking. She states they pressure both comes and goes and that it has been constant for 24 hours a day. She states it may be worse with exertion. She states it is worse lying down and worse after eating. EMS had called Code STEMI.  Past Medical History  Diagnosis Date  . Arthritis   . Diabetes mellitus   . Heart murmur   . Hyperlipidemia   . Hypertension   . Hypothyroidism   . NSTEMI (non-ST elevated myocardial infarction) 10/20/2013  . Ischemic cardiomyopathy 10/20/2013  . Acute pulmonary edema with congestive heart failure 10/20/2013  . Left bundle branch block (LBBB) on electrocardiogram   . Atrial fibrillation 10/20/2013  . OSTEOARTHRITIS 08/06/2006  . Morbid obesity   . Diabetic neuropathy   . Type II diabetes mellitus with complication [id number]  . Chronic kidney disease    Past Surgical History  Procedure Laterality Date  . Corneal transplant  2011    right eye  . Total hip arthroplasty  2010, 2011    right 2010, left 2011  . Abdominal hysterectomy     Family History  Problem Relation Age of Onset  . Colon cancer Neg Hx   . Esophageal cancer Neg Hx   . Rectal cancer Neg Hx   . Stomach cancer Neg Hx    History  Substance Use Topics  . Smoking status:  Former Smoker    Quit date: 08/28/1993  . Smokeless tobacco: Never Used  . Alcohol Use: No   OB History   Grav Para Term Preterm Abortions TAB SAB Ect Mult Living                 Review of Systems  Constitutional: Positive for appetite change.  Respiratory: Positive for cough, chest tightness and shortness of breath.   Cardiovascular: Positive for chest pain and leg swelling.  Gastrointestinal: Negative for abdominal pain.  Musculoskeletal: Negative for back pain.  Skin: Negative for wound.      Allergies  Tape  Home Medications   No current outpatient prescriptions on file. BP 83/49  Pulse 74  Temp(Src) 99 F (37.2 C) (Oral)  Resp 10  Ht 5' 1.81" (1.57 m)  Wt 249 lb 9.6 oz (113.218 kg)  BMI 45.93 kg/m2  SpO2 97% Physical Exam  Constitutional: She is oriented to person, place, and time. She appears well-developed and well-nourished.  HENT:  Head: Atraumatic.  Eyes: Pupils are equal, round, and reactive to light.  Pulmonary/Chest: She has wheezes.  Abdominal: Soft. There is no tenderness.  Musculoskeletal: She exhibits edema.  Bilateral LE pitting edema  Neurological: She is alert and oriented to person, place, and time.    ED Course  Procedures (including critical care time) Labs Review Labs Reviewed  CK TOTAL AND  CKMB - Abnormal; Notable for the following:    Total CK 645 (*)    CK, MB 68.1 (*)    Relative Index 10.6 (*)    All other components within normal limits  TROPONIN I - Abnormal; Notable for the following:    Troponin I 15.33 (*)    All other components within normal limits  CBC - Abnormal; Notable for the following:    Hemoglobin 11.1 (*)    HCT 33.1 (*)    All other components within normal limits  COMPREHENSIVE METABOLIC PANEL - Abnormal; Notable for the following:    Glucose, Bld 219 (*)    BUN 27 (*)    Creatinine, Ser 1.52 (*)    AST 55 (*)    GFR calc non Af Amer 33 (*)    GFR calc Af Amer 38 (*)    All other components within  normal limits  HEMOGLOBIN A1C - Abnormal; Notable for the following:    Hemoglobin A1C 6.0 (*)    Mean Plasma Glucose 126 (*)    All other components within normal limits  APTT - Abnormal; Notable for the following:    aPTT 143 (*)    All other components within normal limits  HEMOGLOBIN A1C - Abnormal; Notable for the following:    Hemoglobin A1C 5.7 (*)    Mean Plasma Glucose 117 (*)    All other components within normal limits  TROPONIN I - Abnormal; Notable for the following:    Troponin I >20.00 (*)    All other components within normal limits  TROPONIN I - Abnormal; Notable for the following:    Troponin I >20.00 (*)    All other components within normal limits  TROPONIN I - Abnormal; Notable for the following:    Troponin I >20.00 (*)    All other components within normal limits  PRO B NATRIURETIC PEPTIDE - Abnormal; Notable for the following:    Pro B Natriuretic peptide (BNP) 3566.0 (*)    All other components within normal limits  BASIC METABOLIC PANEL - Abnormal; Notable for the following:    Glucose, Bld 195 (*)    BUN 26 (*)    Creatinine, Ser 1.50 (*)    GFR calc non Af Amer 34 (*)    GFR calc Af Amer 39 (*)    All other components within normal limits  GLUCOSE, CAPILLARY - Abnormal; Notable for the following:    Glucose-Capillary 188 (*)    All other components within normal limits  COMPREHENSIVE METABOLIC PANEL - Abnormal; Notable for the following:    Potassium 3.5 (*)    Glucose, Bld 153 (*)    BUN 25 (*)    Creatinine, Ser 1.42 (*)    Albumin 3.3 (*)    AST 216 (*)    ALT 45 (*)    GFR calc non Af Amer 36 (*)    GFR calc Af Amer 42 (*)    All other components within normal limits  APTT - Abnormal; Notable for the following:    aPTT 47 (*)    All other components within normal limits  URINALYSIS, ROUTINE W REFLEX MICROSCOPIC - Abnormal; Notable for the following:    APPearance CLOUDY (*)    Hgb urine dipstick MODERATE (*)    Protein, ur 30 (*)     Leukocytes, UA SMALL (*)    All other components within normal limits  URINE MICROSCOPIC-ADD ON - Abnormal; Notable for the following:  Bacteria, UA MANY (*)    All other components within normal limits  PRO B NATRIURETIC PEPTIDE - Abnormal; Notable for the following:    Pro B Natriuretic peptide (BNP) 12597.0 (*)    All other components within normal limits  CARBOXYHEMOGLOBIN - Abnormal; Notable for the following:    Total hemoglobin 11.9 (*)    All other components within normal limits  HEPARIN LEVEL (UNFRACTIONATED) - Abnormal; Notable for the following:    Heparin Unfractionated 0.21 (*)    All other components within normal limits  GLUCOSE, CAPILLARY - Abnormal; Notable for the following:    Glucose-Capillary 158 (*)    All other components within normal limits  CARBOXYHEMOGLOBIN - Abnormal; Notable for the following:    Total hemoglobin 10.1 (*)    All other components within normal limits  CBC - Abnormal; Notable for the following:    RBC 3.86 (*)    Hemoglobin 10.6 (*)    HCT 31.1 (*)    All other components within normal limits  GLUCOSE, CAPILLARY - Abnormal; Notable for the following:    Glucose-Capillary 145 (*)    All other components within normal limits  GLUCOSE, CAPILLARY - Abnormal; Notable for the following:    Glucose-Capillary 139 (*)    All other components within normal limits  CBC - Abnormal; Notable for the following:    RBC 3.53 (*)    Hemoglobin 9.6 (*)    HCT 28.4 (*)    All other components within normal limits  BASIC METABOLIC PANEL - Abnormal; Notable for the following:    Glucose, Bld 105 (*)    BUN 32 (*)    Creatinine, Ser 1.90 (*)    GFR calc non Af Amer 25 (*)    GFR calc Af Amer 29 (*)    All other components within normal limits  PRO B NATRIURETIC PEPTIDE - Abnormal; Notable for the following:    Pro B Natriuretic peptide (BNP) 13837.0 (*)    All other components within normal limits  GLUCOSE, CAPILLARY - Abnormal; Notable for the  following:    Glucose-Capillary 112 (*)    All other components within normal limits  GLUCOSE, CAPILLARY - Abnormal; Notable for the following:    Glucose-Capillary 153 (*)    All other components within normal limits  POCT I-STAT, CHEM 8 - Abnormal; Notable for the following:    Sodium 136 (*)    BUN 26 (*)    Creatinine, Ser 1.40 (*)    Glucose, Bld 210 (*)    All other components within normal limits  POCT I-STAT 3, BLOOD GAS (G3P V) - Abnormal; Notable for the following:    pH, Ven 7.324 (*)    pCO2, Ven 42.8 (*)    pO2, Ven 29.0 (*)    Acid-base deficit 4.0 (*)    All other components within normal limits  POCT I-STAT 3, BLOOD GAS (G3+) - Abnormal; Notable for the following:    pH, Arterial 7.347 (*)    pO2, Arterial 59.0 (*)    Acid-base deficit 4.0 (*)    All other components within normal limits  URINE CULTURE  MRSA PCR SCREENING  LIPID PANEL  PROTIME-INR  PROTIME-INR  TSH  MAGNESIUM  PROTIME-INR  PREALBUMIN  LIPID PANEL  HEPARIN LEVEL (UNFRACTIONATED)  HEPARIN LEVEL (UNFRACTIONATED)  HEPARIN LEVEL (UNFRACTIONATED)  URINALYSIS, ROUTINE W REFLEX MICROSCOPIC  CARBOXYHEMOGLOBIN  BASIC METABOLIC PANEL  TYPE AND SCREEN   Imaging Review Dg Chest Port 1 View  10/21/2013  CLINICAL DATA:  Coronary artery bypass grafting  EXAM: PORTABLE CHEST - 1 VIEW  COMPARISON:  Prior chest x-ray 10/20/2013  FINDINGS: Stable cardiomegaly. A femoral approach Swan-Ganz catheter is noted. The tip projects over the proximal right lower lobe pulmonary artery. Persistent pulmonary vascular congestion but decreasing interstitial edema. No pneumothorax. No focal airspace consolidation. No acute osseous abnormality.  IMPRESSION: 1. Improving interstitial pulmonary edema. 2. Femoral approach Swan-Ganz catheter tip projects over the proximal right lower lobe pulmonary artery.   Electronically Signed   By: Jacqulynn Cadet M.D.   On: 10/21/2013 08:44   Dg Chest Port 1 View  10/20/2013   CLINICAL  DATA:  Evaluate for changes secondary to CHF  EXAM: PORTABLE CHEST - 1 VIEW  COMPARISON:  DG CHEST 2 VIEW dated 12/04/2008  FINDINGS: Low lung volumes. Cardiac silhouette is enlarged. There is cephalization and, prominence of the interstitial markings, indistinctness of the pulmonary vasculature, and peribronchial cuffing. No focal regions of consolidation are appreciated. Diffuse bilateral pulmonary opacities are appreciated. The osseous structures unremarkable.  IMPRESSION: Diffuse interstitial infiltrate consistent with pulmonary edema. No focal regions of consolidation or focal infiltrates appreciated.   Electronically Signed   By: Margaree Mackintosh M.D.   On: 10/20/2013 16:00      MDM   Final diagnoses:  Personal history of colonic polyps  Special screening for malignant neoplasms, colon  Coronary atherosclerosis of native coronary artery  ACS (acute coronary syndrome)  Obesity, unspecified  Other and unspecified hyperlipidemia  Type II or unspecified type diabetes mellitus without mention of complication, not stated as uncontrolled  Unspecified essential hypertension  Atrial fibrillation  Acute pulmonary edema with congestive heart failure  Obesity  Acute coronary syndrome    Patient came in to the ED as a code STEMI. Prehospital EKG was reviewed by myself and Dr. Ellyn Hack, the STEMI cardiologist. Her story it makes pictures of both typical and atypical symptoms. The decision was made to cancel the STEMI, but take her urgently to the cath. Prehospital EKG showed A. fib with left bundle branch block. Left bundle branch block is new from last EKG, however that was several years ago.    Jasper Riling. Alvino Chapel, MD 10/22/13 [phone]

## 2013-10-20 NOTE — Progress Notes (Signed)
Utilization Review Completed.Donne Anon T2/23/2015

## 2013-10-20 NOTE — Significant Event (Signed)
Spoke with cardiology regarding order for heparin gtt as order was discontinued. Per Dr. Ellyn Hack, to continue with heparin at same rate. Chaston Bradburn, Therapist, sports.

## 2013-10-20 NOTE — Progress Notes (Signed)
  Echocardiogram 2D Echocardiogram has been performed.  Diamond Nickel 10/20/2013, 3:28 PM

## 2013-10-20 NOTE — ED Notes (Signed)
Dr. Ellyn Hack at the bedside and Dr. Alvino Chapel at the bedside.

## 2013-10-20 NOTE — Progress Notes (Signed)
ANTICOAGULATION CONSULT NOTE - Initial Consult  Pharmacy Consult for heparin  Indication: ACS  Allergies  Allergen Reactions  . Tape Rash    Patient Measurements:   Heparin Dosing Weight: 72.8 kg Vital Signs: Pulse Rate: 157 (02/23 1213)  Labs:  Recent Labs  10/20/13 1305 10/20/13 1313  HGB 11.1* 12.6  HCT 33.1* 37.0  PLT 288  --   APTT 143*  --   LABPROT 14.8  --   INR 1.19  --   CREATININE 1.52* 1.40*    The CrCl is unknown because both a height and weight (above a minimum accepted value) are required for this calculation.   Medical History: Past Medical History  Diagnosis Date  . Arthritis   . Diabetes mellitus   . Heart murmur   . Hyperlipidemia   . Hypertension   . Hypothyroidism   . Obesity     Medications:  See med history  Assessment: 73 yo lady s/p urgent cath to start heparin therapy.  Her baseline Hg 12.6 and PTLC 288.  MD ordered initial heparin rate at 1000 units/hr Goal of Therapy:  Heparin level 0.3-0.7 units/ml Monitor platelets by anticoagulation protocol: Yes   Plan:  Cont heparin at 1000 units/hr Check heparin level 8 hours after start. F/u clinical course.  Kezia Benevides, [place] 10/20/2013,2:21 PM

## 2013-10-20 NOTE — CV Procedure (Addendum)
CARDIAC CATHETERIZATION REPORT  NAME:  Elaine [name]   MRN: [id number] DOB:  10-31-1940   ADMIT DATE: 10/20/2013 Procedure Date: 10/20/2013  INTERVENTIONAL CARDIOLOGIST: Leonie Man, M.D., MS PRIMARY CARE PROVIDER: Marton Redwood, MD PRIMARY CARDIOLOGIST: Leonie Man, MD, MS  PATIENT:  Elaine [name] is a 73 y.o. female with a PMH of hypertension, diabetes mellitus type 2, obesity, dyslipidemia and long-term smoker who presents after several weeks of persistent chest pressure with dyspnea and edema. She's been on aggressive therapy for reflux which as been minimally effective.This morning at roughly 3:30: This is the became notably worse and she became more short of breath with worsening pressure. After calling her primary care physician's office or to call 911. Upon EMS arrival EKG showed what looked like either sinus tachycardia or atrial fibrillation with a left bundle branch block. Intermittently the computer read the left pulmonary clock as being possible anterior MI. Code STEMI was called, and the patient was met in the emergency room. She was essentially symptom-free from a chest pressure perspective but remained dyspneic.  She was in atrial fibrillation with a rate in the low 100s, she did note some chest pressure somewhat which was reproducible on exam. She has notable lower extremity edema and wheezing on exam. No obvious rales but does note orthopnea. With her significant cardiac risk factors, clinical signs of heart failure, left bundle branch block and atrial fibrillation, was concern for possible acute coronary syndrome and therefore decided to bring the patient directly to cardiac catheterization lab for urgent Right and Left Heart Catheterization.  4000 Units IV Heparin administered upon arrival to the cath lab.  PRE-OPERATIVE DIAGNOSIS:    Acute Pulmonary Edema With Congestive Heart Failure  Acute Coronary Syndrome  Atrial Fibrillation  Left Bundle Branch  Block  PROCEDURES PERFORMED:    Right and Left Heart Catheterization with Coronary Angiography  PROCEDURE:Consent:  Risks of procedure as well as the alternatives and risks of each were explained to the (patient/caregiver).  Verbal Consent for procedure obtained, witnessed by Cath Lab and Emergency Room. Unable to obtain consent because of emergent medical necessity.  PROCEDURE: The patient was brought to the [facility] in the fasting state and prepped and draped in the usual sterile fashion for Right groin or radial access. A modified Allen's test with plethysmography was performed, revealing excellent Ulnar artery collateral flow.  Sterile technique was used including antiseptics, cap, gloves, gown, hand hygiene, mask and sheet.  Skin prep: Chlorhexidine.  Time Out: Verified patient identification, verified procedure, site/side was marked, verified correct patient position, special equipment/implants available, medications/allergies/relevent history reviewed, required imaging and test results available.  Performed  Access:   Venous: Right Common Femoral: 7 Fr sheath; modified Seldinger technique  Arterial: Right Common Femoral Artery; 5 Fr Sheath -- Seldinger technique (Micropuncture Angiocath Kit)  IA Radial Cocktail  Additional 1500 Units of IV Heparin.  ISTAT Labs & routine labs sent off.  Right Heart Catheterization:  7 Fr Gordy Councilman Catheter was advanced through the sheath, and with the balloon inflated once in the SVC, was advanced under fluoroscopic guidance into the first the Right Atrium, then through the Right Ventricle into the Main Pulmonary Artery and into the Wedge position.  Hemodynamics measurements were obtained in each location.  Simultaneous Oxygen saturation were recorded in both the Pulmonary Artery and the Central Aorta.    Simultaneous pressure measurements were obtained in the PA/PCWP and RV along with LV.  The  catheter cover was advanced over the to the sheath, and the sheath was sutured in place.  Diagnostic Left Heart Catheterization And Coronary Angiography:   5 Fr TIG 4.0,  Angled Pigtail, catheters advanced and exchanged over a long exchange Safety-J-wire.  Left & Right Coronary Artery Angiography: TIG 4.0  LV Hemodynamics: Angled pigtail  Sheaths:  Venous sheath sutured in place.  Arterial sheath removed - TR band placed.  Hemodynamic Findings:   SaO2% Pressure mmHg Mean P mmHg EDP mmHg      Right Atrium   16/15    15       Right Ventricle   75/8   14       Pulmonary Artery  49   53/20   38        PCWP   31/40   38        Central Aortic  89  129/84   104        Left Ventricle   initial: 136/38    46      final: 126/18    33    Cardiac Output:  Cardiac Index:       Fick  4.15    1.94     Left Ventriculography: Not performed due to LVEDP of 46 mmHg  Coronary Anatomy: Diffuse moderate calcification of the proximal Left Coronary System  Left Main: Large-caliber vessel it tapers to a 70% distal stenosis that is somewhat hazy in appearance. The vessel bifurcates into the LAD And Circumflex. LAD: Moderate large-caliber vessel with ostial 70% lesion followed by tandem 95% and 80% stenoses prior to SP1. There is a 95% stenosis just after SP1 & D1 takeoff  D1: Small-caliber vessel, diffusely diseased  Left Circumflex: Large-caliber vessel that gives off a proximal obtuse marginal and then terminates as a large lateral OM 2 the OM 2 has proximal roughly 40% disease with several distal branches. There is a very small AV groove branch that comes off proximally.   RCA: Moderate large-caliber, dominant vessel with early mid 60-70% lesion at the first bend followed by a roughly 80% focal lesion just prior to the crux. The vessel then bifurcates distally into the Right Posterior Descending Artery (RPDA)  Right Posterior AV Groove Branch (RPAV)  RPDA: Moderate caliber vessel that reaches almost to  the apex. Diffuse luminal irregularities  RPL Sysytem:The RPAV begins as a moderate caliber vessel with really gives off one major posterior lateral branch with several small branches and AV nodal artery. Diffuse luminal irregularities.  After reviewing the initial angiography, the culprit lesion was thought to be distal left main with diffuse disease in the proximal and mid LAD.   The right heart catheterization was performed with the plan to leave Swan-Ganz catheter in place to allow for adequate diuresis.  MEDICATIONS:  Anesthesia:  Local Lidocaine 18 ml  Sedation:  1 mg IV Versed, 25 mcg IV fentanyl ;   Premedication: IV Lasix 22m x 1  Omnipaque Contrast: 65 ml  Anticoagulation:  IV Heparin 4000+1500 Units  Radial Cocktail: 5 mg Verapamil, 400 mcg NTG, 2 ml 2% Lidocaine in 10 ml NS -- 10 mL IV Lopressor: 5 mg IV nitroglycerin infusion initiated 20 mcg/min   PATIENT DISPOSITION:    The patient was transferred to the PACU holding area in a hemodynamicaly stable, chest pain free condition.  The patient tolerated the procedure well, and there were no complications.  EBL:   < 10 ml  The patient was stable  before, during, and after the procedure.  POST-OPERATIVE DIAGNOSIS:    Severe multivessel CAD with severe distal left main and ostial/proximal and mid LAD disease along with moderate to severe RCA disease..  Moderately Reduced Cardiac Output with severely elevated LVEDP suggestive of likely acute systolic and diastolic heart failure.  PLAN OF CARE:  Admit to the ICU for aggressive diuresis and afterload reduction with IV nitroglycerin.  IV heparin plus aspirin  Stat Echocardiogram Ordered.  Low-dose beta blocker added to ACE inhibitor that she's been on chronically. Will avoid high doses until we are sure of her EF.  Would anticipate 1-2 days "tuneup "of medical therapy prior to CABG.  Continue statin and aggressive glycemic control.  Case was discussed in detail  the patient's husband 2 sons and grandson. I've also talked with Dr. Darylene Price from CT surgery.  Leonie Man, M.D., M.S. [facility] [address] [place], [place]  [address]  [phone]  10/20/2013 1:45 PM

## 2013-10-20 NOTE — Progress Notes (Signed)
Pt was unable to complete the PFT due to SOB and coughing. Pt made 2 attempts at [facility] and hand the mouthpiece to technician and stated "I can't do this". TCTS RN called and made aware of status of test.

## 2013-10-20 NOTE — ED Notes (Signed)
Per [facility], pt from home for chest pressure in the epigastric region for 6 weeks progressively getting worse. New onset Afib per EMS. 18g to RAC, 3 NTG given. Pain from 10 to 2. BP 128/97 placed on oxygen 2 liters for comfort.

## 2013-10-20 NOTE — H&P (Signed)
History and Physical Interval Note:  NAME:  Elaine [name]   MRN: [id number] DOB:  04/10/1941   ADMIT DATE: 10/20/2013   10/20/2013 12:23 PM  Elaine [name] is a 73 y.o. female with PMH of HTN, DM-2, obesity & former smoker who has several weeks of ongoing/persistent chest pressure and now shortness of breath and edema. She has been on aggressive therapy for reflux that has not been effective. This morning at roughly 3:30: This is the became notably worse and she became more short of breath with worsening pressure. After calling her primary care physician's office or to call 911. Upon EMS arrival EKG showed what looked like either sinus tachycardia or atrial fibrillation with a left bundle branch block. Intermittently the computer read the left pulmonary clock as being possible anterior MI. Code STEMI was called, and the patient was met in the emergency room. She was essentially symptom-free from a chest pressure perspective but remained dyspneic.   She was in atrial fibrillation with a rate in the low 100s, she did note some chest pressure somewhat which was reproducible on exam. She has notable lower chimney edema and wheezing on exam. No obvious rales but does note orthopnea.  Past Medical History  Diagnosis Date  . Arthritis   . Diabetes mellitus   . Heart murmur   . Hyperlipidemia   . Hypertension   . Hypothyroidism    Past Surgical History  Procedure Laterality Date  . Corneal transplant  2011    right eye  . Hip surgery  2010, 2011    right 2010, left 2011    FAMHx: Family History  Problem Relation Age of Onset  . Colon cancer Neg Hx   . Esophageal cancer Neg Hx   . Rectal cancer Neg Hx   . Stomach cancer Neg Hx     SOCHx:  reports that she quit smoking about 20 years ago. She has never used smokeless tobacco. She reports that she does not drink alcohol or use illicit drugs.  ALLERGIES: Allergies  Allergen Reactions  . Tape Rash    HOME MEDICATIONS:  (Not in a  hospital admission)  PHYSICAL EXAM:There were no vitals taken for this visit. See full exam on complete H&P by Lorretta Harp, PA General appearance: alert, cooperative, moderate distress and moderately obese Lungs: Diffuse wheezes with mild basal rales that cleared with cough Heart: Irregularly irregular, mildly rapid. Distant heart sounds but he S2 appears to be split Abdomen: Moderately obese with normal active bowel sounds Extremities: edema Roughly 2+ pitting edema. Pulses: 2+ and symmetric Neurologic: Grossly normal  IMPRESSION & PLAN Based on her presentation with chest pressure dyspnea and edema in the setting of a left bundle branch block and atrial fibrillation, I felt the best course of action was to proceed with urgent/not emergent cardiac catheterization to exclude coronary artery disease in this patient with significant cardiovascular risk factors.  The patients' history has been reviewed, patient examined, no change in status from most recent note, stable for surgery. I have reviewed the patients' chart and labs. Questions were answered to the patient's satisfaction.   AUBRIELLE STROUD has presented today for surgery, with the diagnosis of  Acute Coronary Syndrome, Atrial Fibrillation, left bundle branch block. The various methods of treatment have been discussed with the patient and family.   Risks / Complications include, but not limited to: Death, MI, CVA/TIA, VF/VT (with defibrillation), Bradycardia (need for temporary pacer placement), contrast induced nephropathy, bleeding / bruising / hematoma /  pseudoaneurysm, vascular or coronary injury (with possible emergent CT or Vascular Surgery), adverse medication reactions, infection.     After consideration of risks, benefits and other options for treatment, the patient has  provided verbal consent for the following Procedure(s):  LEFT HEART CATHETERIZATION AND CORONARY ANGIOGRAPHY +/- AD [place]  as a surgical intervention.   Emergency consent was obtained with verbal consent witnessed by the Cath Lab staff.  We will proceed with the planned procedure.   [facility] [place] [place], [place]  [id number]  [phone]  10/20/2013 12:23 PM

## 2013-10-20 NOTE — H&P (Signed)
I have seen and evaluated the patient this AM along with Perry Mount, PA. I agree with her findings, examination as well as impression recommendations.  The patient is a very pleasant 73 year old parenchymal no history of diabetes, hypertension hyperlipidemia with a distant history of smoking 30 pack year. She apparently has been having at 6 weeks of chest pressure and dyspnea is worse with exertion but is also worse eating. She's been treated with antacids and PPI/H2 blockers with minimal effectiveness.  It is a gotten progressively worse and this morning she awoke with that than pressure significant dyspnea/orthopnea. She is also noted worsening edema.  She called her primary physician's office and was told that they would contact EMS to come pick her up. Upon EMS arrival her EKG showed A. fib with a Left Bundle Branch Block. At least one EMS EKG suggested possible MI. Code STEMI was called. Upon arrival to [facility] the patient was in moderate amount of distress with minimal chest pressure at present but was very short of breath. She is quite orthopneic with wheezing. I was concerned with her cardiac risk factors and atrial fibrillation as well as left bundle branch block that she had high likelihood of having significant coronary artery disease. My suspicion is this is at least acute coronary syndrome, but not diagnostic of STEMI. I do think however she requires urgent cardiac catheterization, and we'll consider right heart catheterization based on LVEDP.  He was taken directly from the emergency room after initial evaluation to the Cath Lab for cardiac catheterization.  Leonie Man, M.D., M.S. Interventional Cardiologist  [place] Pager # [phone] 10/20/2013

## 2013-10-21 ENCOUNTER — Inpatient Hospital Stay (HOSPITAL_COMMUNITY): Payer: Medicare HMO

## 2013-10-21 LAB — URINE CULTURE
CULTURE: NO GROWTH
Colony Count: NO GROWTH

## 2013-10-21 LAB — COMPREHENSIVE METABOLIC PANEL
ALK PHOS: 60 U/L (ref 39–117)
ALT: 45 U/L — ABNORMAL HIGH (ref 0–35)
AST: 216 U/L — ABNORMAL HIGH (ref 0–37)
Albumin: 3.3 g/dL — ABNORMAL LOW (ref 3.5–5.2)
BUN: 25 mg/dL — AB (ref 6–23)
CO2: 26 mEq/L (ref 19–32)
Calcium: 9.5 mg/dL (ref 8.4–10.5)
Chloride: 99 mEq/L (ref 96–112)
Creatinine, Ser: 1.42 mg/dL — ABNORMAL HIGH (ref 0.50–1.10)
GFR calc Af Amer: 42 mL/min — ABNORMAL LOW (ref >90)
GFR calc non Af Amer: 36 mL/min — ABNORMAL LOW (ref >90)
GLUCOSE: 153 mg/dL — AB (ref 70–99)
POTASSIUM: 3.5 meq/L — AB (ref 3.7–5.3)
Sodium: 140 mEq/L (ref 137–147)
Total Bilirubin: 0.4 mg/dL (ref 0.3–1.2)
Total Protein: 7.1 g/dL (ref 6.0–8.3)

## 2013-10-21 LAB — GLUCOSE, CAPILLARY
GLUCOSE-CAPILLARY: 145 mg/dL — AB (ref 70–99)
GLUCOSE-CAPILLARY: 112 mg/dL — AB (ref 70–99)
Glucose-Capillary: 139 mg/dL — ABNORMAL HIGH (ref 70–99)
Glucose-Capillary: 153 mg/dL — ABNORMAL HIGH (ref 70–99)

## 2013-10-21 LAB — LIPID PANEL
Cholesterol: 147 mg/dL (ref 0–200)
HDL: 83 mg/dL (ref >39)
LDL Cholesterol: 50 mg/dL (ref 0–99)
TRIGLYCERIDES: 69 mg/dL (ref <150)
Total CHOL/HDL Ratio: 1.8 RATIO
VLDL: 14 mg/dL (ref 0–40)

## 2013-10-21 LAB — HEPARIN LEVEL (UNFRACTIONATED)
Heparin Unfractionated: 0.21 IU/mL — ABNORMAL LOW (ref 0.30–0.70)
Heparin Unfractionated: 0.33 IU/mL (ref 0.30–0.70)

## 2013-10-21 LAB — TROPONIN I: Troponin I: >20 ng/mL (ref <0.30)

## 2013-10-21 LAB — CBC
HCT: 31.1 % — ABNORMAL LOW (ref 36.0–46.0)
HEMOGLOBIN: 10.6 g/dL — AB (ref 12.0–15.0)
MCH: 27.5 pg (ref 26.0–34.0)
MCHC: 34.1 g/dL (ref 30.0–36.0)
MCV: 80.6 fL (ref 78.0–100.0)
Platelets: 250 10*3/uL (ref 150–400)
RBC: 3.86 MIL/uL — ABNORMAL LOW (ref 3.87–5.11)
RDW: 14.8 % (ref 11.5–15.5)
WBC: 10.5 10*3/uL (ref 4.0–10.5)

## 2013-10-21 LAB — PROTIME-INR
INR: 1.01 (ref 0.00–1.49)
Prothrombin Time: 13.1 seconds (ref 11.6–15.2)

## 2013-10-21 LAB — PREALBUMIN: Prealbumin: 26.2 mg/dL (ref 17.0–34.0)

## 2013-10-21 LAB — CARBOXYHEMOGLOBIN
Carboxyhemoglobin: 1.3 % (ref 0.5–1.5)
METHEMOGLOBIN: 1.2 % (ref 0.0–1.5)
O2 Saturation: 66 %
Total hemoglobin: 10.1 g/dL — ABNORMAL LOW (ref 12.0–16.0)

## 2013-10-21 LAB — PRO B NATRIURETIC PEPTIDE: Pro B Natriuretic peptide (BNP): 12597 pg/mL — ABNORMAL HIGH (ref 0–125)

## 2013-10-21 LAB — APTT: APTT: 47 s — AB (ref 24–37)

## 2013-10-21 MED ORDER — POTASSIUM CHLORIDE CRYS ER 20 MEQ PO TBCR
40.0000 meq | EXTENDED_RELEASE_TABLET | Freq: Once | ORAL | Status: AC
  Administered 2013-10-21: 40 meq via ORAL
  Filled 2013-10-21: qty 2

## 2013-10-21 NOTE — Progress Notes (Signed)
SUBJECTIVE:  No chest pain.  Off IV NTG.     PHYSICAL EXAM Filed Vitals:   10/21/13 0630 10/21/13 0700 10/21/13 0730 10/21/13 0815  BP:  93/53 101/63   Pulse: 95 90 95   Temp: 99.7 F (37.6 C) 99.7 F (37.6 C) 99.7 F (37.6 C) 98.6 F (37 C)  TempSrc:    Oral  Resp: 14 30 19    Height:      Weight:      SpO2: 97% 98% 99%    General:  No distress Lungs:  No JVD at 45 degrees Heart:  RRR Abdomen:  Positive bowel sounds, no rebound no guarding Extremities:  No edema Neuro:  Nonfocal.    LABS: Lab Results  Component Value Date   TROPONINI >20.00* 10/21/2013   Results for orders placed during the hospital encounter of 10/20/13 (from the past 24 hour(s))  CK TOTAL AND CKMB     Status: Abnormal   Collection Time    10/20/13  1:05 PM      Result Value Ref Range   Total CK 645 (*) 7 - 177 U/L   CK, MB 68.1 (*) 0.3 - 4.0 ng/mL   Relative Index 10.6 (*) 0.0 - 2.5  TROPONIN I     Status: Abnormal   Collection Time    10/20/13  1:05 PM      Result Value Ref Range   Troponin I 15.33 (*) <0.30 ng/mL  CBC     Status: Abnormal   Collection Time    10/20/13  1:05 PM      Result Value Ref Range   WBC 10.1  4.0 - 10.5 K/uL   RBC 4.08  3.87 - 5.11 MIL/uL   Hemoglobin 11.1 (*) 12.0 - 15.0 g/dL   HCT 33.1 (*) 36.0 - 46.0 %   MCV 81.1  78.0 - 100.0 fL   MCH 27.2  26.0 - 34.0 pg   MCHC 33.5  30.0 - 36.0 g/dL   RDW 14.9  11.5 - 15.5 %   Platelets 288  150 - 400 K/uL  COMPREHENSIVE METABOLIC PANEL     Status: Abnormal   Collection Time    10/20/13  1:05 PM      Result Value Ref Range   Sodium 138  137 - 147 mEq/L   Potassium 3.8  3.7 - 5.3 mEq/L   Chloride 100  96 - 112 mEq/L   CO2 20  19 - 32 mEq/L   Glucose, Bld 219 (*) 70 - 99 mg/dL   BUN 27 (*) 6 - 23 mg/dL   Creatinine, Ser 1.52 (*) 0.50 - 1.10 mg/dL   Calcium 9.9  8.4 - 10.5 mg/dL   Total Protein 7.7  6.0 - 8.3 g/dL   Albumin 3.6  3.5 - 5.2 g/dL   AST 55 (*) 0 - 37 U/L   ALT 20  0 - 35 U/L   Alkaline Phosphatase  68  39 - 117 U/L   Total Bilirubin 0.3  0.3 - 1.2 mg/dL   GFR calc non Af Amer 33 (*) >90 mL/min   GFR calc Af Amer 38 (*) >90 mL/min  HEMOGLOBIN A1C     Status: Abnormal   Collection Time    10/20/13  1:05 PM      Result Value Ref Range   Hemoglobin A1C 6.0 (*) <5.7 %   Mean Plasma Glucose 126 (*) <117 mg/dL  LIPID PANEL     Status: None   Collection Time  10/20/13  1:05 PM      Result Value Ref Range   Cholesterol 147  0 - 200 mg/dL   Triglycerides 42  <150 mg/dL   HDL 89  >39 mg/dL   Total CHOL/HDL Ratio 1.7     VLDL 8  0 - 40 mg/dL   LDL Cholesterol 50  0 - 99 mg/dL  PROTIME-INR     Status: None   Collection Time    10/20/13  1:05 PM      Result Value Ref Range   Prothrombin Time 14.8  11.6 - 15.2 seconds   INR 1.19  0.00 - 1.49  APTT     Status: Abnormal   Collection Time    10/20/13  1:05 PM      Result Value Ref Range   aPTT 143 (*) 24 - 37 seconds  POCT I-STAT, CHEM 8     Status: Abnormal   Collection Time    10/20/13  1:13 PM      Result Value Ref Range   Sodium 136 (*) 137 - 147 mEq/L   Potassium 3.7  3.7 - 5.3 mEq/L   Chloride 100  96 - 112 mEq/L   BUN 26 (*) 6 - 23 mg/dL   Creatinine, Ser 1.40 (*) 0.50 - 1.10 mg/dL   Glucose, Bld 210 (*) 70 - 99 mg/dL   Calcium, Ion 1.30  1.13 - 1.30 mmol/L   TCO2 20  0 - 100 mmol/L   Hemoglobin 12.6  12.0 - 15.0 g/dL   HCT 37.0  36.0 - 46.0 %  POCT I-STAT 3, BLOOD GAS (G3+)     Status: Abnormal   Collection Time    10/20/13  1:36 PM      Result Value Ref Range   pH, Arterial 7.347 (*) 7.350 - 7.450   pCO2 arterial 39.4  35.0 - 45.0 mmHg   pO2, Arterial 59.0 (*) 80.0 - 100.0 mmHg   Bicarbonate 21.6  20.0 - 24.0 mEq/L   TCO2 23  0 - 100 mmol/L   O2 Saturation 89.0     Acid-base deficit 4.0 (*) 0.0 - 2.0 mmol/L   Sample type ARTERIAL    POCT I-STAT 3, BLOOD GAS (G3P V)     Status: Abnormal   Collection Time    10/20/13  1:38 PM      Result Value Ref Range   pH, Ven 7.324 (*) 7.250 - 7.300   pCO2, Ven 42.8 (*)  45.0 - 50.0 mmHg   pO2, Ven 29.0 (*) 30.0 - 45.0 mmHg   Bicarbonate 22.3  20.0 - 24.0 mEq/L   TCO2 24  0 - 100 mmol/L   O2 Saturation 49.0     Acid-base deficit 4.0 (*) 0.0 - 2.0 mmol/L   Sample type VENOUS    URINALYSIS, ROUTINE W REFLEX MICROSCOPIC     Status: Abnormal   Collection Time    10/20/13  3:30 PM      Result Value Ref Range   Color, Urine YELLOW  YELLOW   APPearance CLOUDY (*) CLEAR   Specific Gravity, Urine 1.018  1.005 - 1.030   pH 5.0  5.0 - 8.0   Glucose, UA NEGATIVE  NEGATIVE mg/dL   Hgb urine dipstick MODERATE (*) NEGATIVE   Bilirubin Urine NEGATIVE  NEGATIVE   Ketones, ur NEGATIVE  NEGATIVE mg/dL   Protein, ur 30 (*) NEGATIVE mg/dL   Urobilinogen, UA 0.2  0.0 - 1.0 mg/dL   Nitrite NEGATIVE  NEGATIVE  Leukocytes, UA SMALL (*) NEGATIVE  URINE MICROSCOPIC-ADD ON     Status: Abnormal   Collection Time    10/20/13  3:30 PM      Result Value Ref Range   Squamous Epithelial / LPF RARE  RARE   WBC, UA 3-6  <3 WBC/hpf   RBC / HPF 3-6  <3 RBC/hpf   Bacteria, UA MANY (*) RARE   Urine-Other AMORPHOUS URATES/PHOSPHATES    GLUCOSE, CAPILLARY     Status: Abnormal   Collection Time    10/20/13  4:29 PM      Result Value Ref Range   Glucose-Capillary 188 (*) 70 - 99 mg/dL   Comment 1 Documented in Chart     Comment 2 Notify RN    HEMOGLOBIN A1C     Status: Abnormal   Collection Time    10/20/13  4:32 PM      Result Value Ref Range   Hemoglobin A1C 5.7 (*) <5.7 %   Mean Plasma Glucose 117 (*) <117 mg/dL  TROPONIN I     Status: Abnormal   Collection Time    10/20/13  4:32 PM      Result Value Ref Range   Troponin I >20.00 (*) <0.30 ng/mL  PROTIME-INR     Status: None   Collection Time    10/20/13  4:32 PM      Result Value Ref Range   Prothrombin Time 13.4  11.6 - 15.2 seconds   INR 1.04  0.00 - 1.49  TSH     Status: None   Collection Time    10/20/13  4:32 PM      Result Value Ref Range   TSH 2.351  0.350 - 4.500 uIU/mL  PRO B NATRIURETIC PEPTIDE      Status: Abnormal   Collection Time    10/20/13  4:32 PM      Result Value Ref Range   Pro B Natriuretic peptide (BNP) 3566.0 (*) 0 - 125 pg/mL  MAGNESIUM     Status: None   Collection Time    10/20/13  4:32 PM      Result Value Ref Range   Magnesium 1.7  1.5 - 2.5 mg/dL  BASIC METABOLIC PANEL     Status: Abnormal   Collection Time    10/20/13  4:32 PM      Result Value Ref Range   Sodium 139  137 - 147 mEq/L   Potassium 4.5  3.7 - 5.3 mEq/L   Chloride 100  96 - 112 mEq/L   CO2 24  19 - 32 mEq/L   Glucose, Bld 195 (*) 70 - 99 mg/dL   BUN 26 (*) 6 - 23 mg/dL   Creatinine, Ser 1.50 (*) 0.50 - 1.10 mg/dL   Calcium 10.1  8.4 - 10.5 mg/dL   GFR calc non Af Amer 34 (*) >90 mL/min   GFR calc Af Amer 39 (*) >90 mL/min  MRSA PCR SCREENING     Status: None   Collection Time    10/20/13  6:00 PM      Result Value Ref Range   MRSA by PCR NEGATIVE  NEGATIVE  GLUCOSE, CAPILLARY     Status: Abnormal   Collection Time    10/20/13  8:53 PM      Result Value Ref Range   Glucose-Capillary 158 (*) 70 - 99 mg/dL   Comment 1 Documented in Chart     Comment 2 Notify RN    TROPONIN I  Status: Abnormal   Collection Time    10/20/13  9:03 PM      Result Value Ref Range   Troponin I >20.00 (*) <0.30 ng/mL  CARBOXYHEMOGLOBIN     Status: Abnormal   Collection Time    10/20/13  9:21 PM      Result Value Ref Range   Total hemoglobin 11.9 (*) 12.0 - 16.0 g/dL   O2 Saturation 59.6     Carboxyhemoglobin 1.1  0.5 - 1.5 %   Methemoglobin 1.2  0.0 - 1.5 %  TROPONIN I     Status: Abnormal   Collection Time    10/21/13  3:07 AM      Result Value Ref Range   Troponin I >20.00 (*) <0.30 ng/mL  PROTIME-INR     Status: None   Collection Time    10/21/13  3:07 AM      Result Value Ref Range   Prothrombin Time 13.1  11.6 - 15.2 seconds   INR 1.01  0.00 - 1.49  COMPREHENSIVE METABOLIC PANEL     Status: Abnormal   Collection Time    10/21/13  3:07 AM      Result Value Ref Range   Sodium 140  137 -  147 mEq/L   Potassium 3.5 (*) 3.7 - 5.3 mEq/L   Chloride 99  96 - 112 mEq/L   CO2 26  19 - 32 mEq/L   Glucose, Bld 153 (*) 70 - 99 mg/dL   BUN 25 (*) 6 - 23 mg/dL   Creatinine, Ser 1.42 (*) 0.50 - 1.10 mg/dL   Calcium 9.5  8.4 - 10.5 mg/dL   Total Protein 7.1  6.0 - 8.3 g/dL   Albumin 3.3 (*) 3.5 - 5.2 g/dL   AST 216 (*) 0 - 37 U/L   ALT 45 (*) 0 - 35 U/L   Alkaline Phosphatase 60  39 - 117 U/L   Total Bilirubin 0.4  0.3 - 1.2 mg/dL   GFR calc non Af Amer 36 (*) >90 mL/min   GFR calc Af Amer 42 (*) >90 mL/min  APTT     Status: Abnormal   Collection Time    10/21/13  3:07 AM      Result Value Ref Range   aPTT 47 (*) 24 - 37 seconds  PRO B NATRIURETIC PEPTIDE     Status: Abnormal   Collection Time    10/21/13  3:07 AM      Result Value Ref Range   Pro B Natriuretic peptide (BNP) 12597.0 (*) 0 - 125 pg/mL  HEPARIN LEVEL (UNFRACTIONATED)     Status: Abnormal   Collection Time    10/21/13  3:07 AM      Result Value Ref Range   Heparin Unfractionated 0.21 (*) 0.30 - 0.70 IU/mL  LIPID PANEL     Status: None   Collection Time    10/21/13  3:07 AM      Result Value Ref Range   Cholesterol 147  0 - 200 mg/dL   Triglycerides 69  <150 mg/dL   HDL 83  >39 mg/dL   Total CHOL/HDL Ratio 1.8     VLDL 14  0 - 40 mg/dL   LDL Cholesterol 50  0 - 99 mg/dL  CBC     Status: Abnormal   Collection Time    10/21/13  3:07 AM      Result Value Ref Range   WBC 10.5  4.0 - 10.5 K/uL   RBC 3.86 (*)  3.87 - 5.11 MIL/uL   Hemoglobin 10.6 (*) 12.0 - 15.0 g/dL   HCT 31.1 (*) 36.0 - 46.0 %   MCV 80.6  78.0 - 100.0 fL   MCH 27.5  26.0 - 34.0 pg   MCHC 34.1  30.0 - 36.0 g/dL   RDW 14.8  11.5 - 15.5 %   Platelets 250  150 - 400 K/uL  CARBOXYHEMOGLOBIN     Status: Abnormal   Collection Time    10/21/13  4:15 AM      Result Value Ref Range   Total hemoglobin 10.1 (*) 12.0 - 16.0 g/dL   O2 Saturation 66.0     Carboxyhemoglobin 1.3  0.5 - 1.5 %   Methemoglobin 1.2  0.0 - 1.5 %  TYPE AND SCREEN      Status: None   Collection Time    10/21/13  5:00 AM      Result Value Ref Range   ABO/RH(D) A POS     Antibody Screen NEG     Sample Expiration 10/24/2013    GLUCOSE, CAPILLARY     Status: Abnormal   Collection Time    10/21/13  8:12 AM      Result Value Ref Range   Glucose-Capillary 145 (*) 70 - 99 mg/dL   Comment 1 Documented in Chart     Comment 2 Notify RN      Intake/Output Summary (Last 24 hours) at 10/21/13 0846 Last data filed at 10/21/13 0700  Gross per 24 hour  Intake 1400.95 ml  Output   3570 ml  Net -2169.05 ml    EKG:  NSR, rate 95, IVCD, LAD, QT prolonged, no acute ST T wave changes. 10/21/2013  ASSESSMENT AND PLAN:  CAD:  Possible CABG in the future pending medical treatment of CHF.   BP is borderline so no further med titration at this point.   ISCHEMIC CARDIOMYOPATHY WITH ACUTE ON CHRONIC SYSTOLIC AND DIASTOLIC HF:    Good urine out put last night.  Continue current IV Lasix.   OK to remove swan.    ATRIAL FIB:   She is in NSR.    DM:  Blood sugars OK on current therapy.  Continue.      Jeneen Rinks [facility] 10/21/2013 8:46 AM

## 2013-10-21 NOTE — Progress Notes (Signed)
7 french sheath removed from right femoral vein and pressure held x 10 min.  Dressing applied to site.  Vital stable throughout sheath removal.  Instructions given about bedrest.  RN will continue to monitor site.

## 2013-10-21 NOTE — Plan of Care (Signed)
Problem: Phase I Progression Outcomes Goal: Vascular site scale level 0 - I Vascular Site Scale Level 0: No bruising/bleeding/hematoma Level I (Mild): Bruising/Ecchymosis, minimal bleeding/ooozing, palpable hematoma < 3 cm Level II (Moderate): Bleeding not affecting hemodynamic parameters, pseudoaneurysm, palpable hematoma > 3 cm Level III (Severe) Bleeding which affects hemodynamic parameters or retroperitoneal hemorrhage  Outcome: Completed/Met Date Met:  10/21/13 Level 0

## 2013-10-21 NOTE — Progress Notes (Signed)
[place] for heparin  Indication: ACS  Allergies  Allergen Reactions  . Tape Rash    Patient Measurements: Height: 5' 1.81" (157 cm) IBW/kg (Calculated) : 49.67 Heparin Dosing Weight: 72.8 kg Vital Signs: Temp: 99.5 F (37.5 C) (02/24 0300) Temp src: Core (Comment) (02/24 0000) BP: 99/78 mmHg (02/24 0300) Pulse Rate: 100 (02/24 0300)  Labs:  Recent Labs  10/20/13 1305 10/20/13 1313 10/20/13 1632 10/20/13 2103 10/21/13 0307  HGB 11.1* 12.6  --   --  10.6*  HCT 33.1* 37.0  --   --  31.1*  PLT 288  --   --   --  250  APTT 143*  --   --   --  47*  LABPROT 14.8  --  13.4  --  13.1  INR 1.19  --  1.04  --  1.01  HEPARINUNFRC  --   --   --   --  0.21*  CREATININE 1.52* 1.40* 1.50*  --   --   CKTOTAL 645*  --   --   --   --   CKMB 68.1*  --   --   --   --   TROPONINI 15.33*  --  >20.00* >20.00*  --     The CrCl is unknown because both a height and weight (above a minimum accepted value) are required for this calculation.  Assessment: 73 yo female with Afib and CAD awaiting CABG for heparin  Goal of Therapy:  Heparin level 0.3-0.7 units/ml Monitor platelets by anticoagulation protocol: Yes   Plan:  Increase Heparin 1200 units/hr Check heparin level in 8 hours.  Desani Sprung, Bronson Curb 10/21/2013,3:38 AM

## 2013-10-21 NOTE — Progress Notes (Signed)
      [address]       [place] [address]             [phone]        CARDIOTHORACIC SURGERY PROGRESS NOTE   R1 Day Post-Op Procedure(s) (LRB): LEFT HEART CATHETERIZATION WITH CORONARY ANGIOGRAM (N/A)  Subjective: Feels much better.  No chest pain/pressure.  Denies SOB  Objective: Vital signs: BP Readings from Last 1 Encounters:  10/21/13 101/63   Pulse Readings from Last 1 Encounters:  10/21/13 95   Resp Readings from Last 1 Encounters:  10/21/13 19   Temp Readings from Last 1 Encounters:  10/21/13 98.6 F (37 C) Oral    Hemodynamics: PAP: (34-73)/(18-44) 43/26 mmHg CO:  [4.4 L/min-5.1 L/min] 4.4 L/min CI:  [2.1 L/min/m2-2.4 L/min/m2] 2.1 L/min/m2  Physical Exam:  Rhythm:   sinus  Breath sounds: Few rhonchi, fairly clear  Heart sounds:  RRR  Incisions:  n/a  Abdomen:  soft  Extremities:  warm   Intake/Output from previous day: 02/23 0701 - 02/24 0700 In: 1401 [P.O.:300; I.V.:1101] Out: 3570 [Urine:3570] Intake/Output this shift: Total I/O In: -  Out: 150 [Urine:150]  Lab Results:  CBC: Recent Labs  10/20/13 1305 10/20/13 1313 10/21/13 0307  WBC 10.1  --  10.5  HGB 11.1* 12.6 10.6*  HCT 33.1* 37.0 31.1*  PLT 288  --  250    BMET:  Recent Labs  10/20/13 1632 10/21/13 0307  NA 139 140  K 4.5 3.5*  CL 100 99  CO2 24 26  GLUCOSE 195* 153*  BUN 26* 25*  CREATININE 1.50* 1.42*  CALCIUM 10.1 9.5    Results for Elaine [name], Elaine [name] (MRN [id number]) as of 10/21/2013 09:47  Ref. Range 10/21/2013 03:07  Troponin I Latest Range: <0.30 ng/mL >20.00 (HH)  Pro B Natriuretic peptide (BNP) Latest Range: 0-125 pg/mL 12597.0 (H)     CBG (last 3)   Recent Labs  10/20/13 1629 10/20/13 2053 10/21/13 0812  GLUCAP 188* 158* 145*    ABG    Component Value Date/Time   PHART 7.347* 10/20/2013 1336   PCO2ART 39.4 10/20/2013 1336   PO2ART 59.0* 10/20/2013 1336   HCO3 22.3 10/20/2013 1338   TCO2 24 10/20/2013 1338   ACIDBASEDEF 4.0*  10/20/2013 1338   O2SAT 66.0 10/21/2013 0415    CXR: PORTABLE CHEST - 1 VIEW  COMPARISON: Prior chest x-ray 10/20/2013  FINDINGS:  Stable cardiomegaly. A femoral approach Swan-Ganz catheter is noted.  The tip projects over the proximal right lower lobe pulmonary  artery. Persistent pulmonary vascular congestion but decreasing  interstitial edema. No pneumothorax. No focal airspace  consolidation. No acute osseous abnormality.  IMPRESSION:  1. Improving interstitial pulmonary edema.  2. Femoral approach Swan-Ganz catheter tip projects over the  proximal right lower lobe pulmonary artery.  Electronically Signed  By: Jacqulynn Cadet M.D.  On: 10/21/2013 08:44   Assessment/Plan: S/P Procedure(s) (LRB): LEFT HEART CATHETERIZATION WITH CORONARY ANGIOGRAM (N/A)  Stable overnight with no further chest pain Maintaining NSR w/ stable hemodynamics Pulmonary edema improved Will continue to follow closely I agree w/ removing swan-ganz catheter and sheaths in groin and mobilize patient out of bed Possible CABG later this week depending on progress  Reshad Saab H 10/21/2013 9:47 AM

## 2013-10-21 NOTE — Progress Notes (Signed)
ANTICOAGULATION CONSULT NOTE - Follow Up Consult  Pharmacy Consult for Heparin Indication: multivessel CAD  Allergies  Allergen Reactions  . Tape Rash    Adhesive tape    Patient Measurements: Height: 5' 1.81" (157 cm) Weight: 250 lb (113.399 kg) IBW/kg (Calculated) : 49.67 Heparin Dosing Weight: 72.8kg  Vital Signs: Temp: 98.5 F (36.9 C) (02/24 1126) Temp src: Oral (02/24 1126) BP: 110/67 mmHg (02/24 0930) Pulse Rate: 90 (02/24 0930)  Labs:  Recent Labs  10/20/13 1305 10/20/13 1313 10/20/13 1632 10/20/13 2103 10/21/13 0307 10/21/13 1418  HGB 11.1* 12.6  --   --  10.6*  --   HCT 33.1* 37.0  --   --  31.1*  --   PLT 288  --   --   --  250  --   APTT 143*  --   --   --  47*  --   LABPROT 14.8  --  13.4  --  13.1  --   INR 1.19  --  1.04  --  1.01  --   HEPARINUNFRC  --   --   --   --  0.21* 0.33  CREATININE 1.52* 1.40* 1.50*  --  1.42*  --   CKTOTAL 645*  --   --   --   --   --   CKMB 68.1*  --   --   --   --   --   TROPONINI 15.33*  --  >20.00* >20.00* >20.00*  --     Estimated Creatinine Clearance: 42.5 ml/min (by C-G formula based on Cr of 1.42).   Medications:  Heparin @ 1300 units/hr  Assessment: 72yof started on heparin post urgent cath for multivessel disease. She will have CABG later this week. Heparin level is therapeutic. CBC stable. No bleeding reported.  Goal of Therapy:  Heparin level 0.3-0.7 units/ml Monitor platelets by anticoagulation protocol: Yes   Plan:  1) Continue heparin at 1300 units/hr 2) Check heparin level in 8 hours to confirm  Deboraha Sprang 10/21/2013,2:48 PM

## 2013-10-21 NOTE — Progress Notes (Signed)
[id number] Discussed sternal precautions and I showed pt how to get up and down without using arms in demonstration. Discussed importance of mobility and IS. Pt states she can get 500-750 ml on IS. Gave [facility] booklet and information sheet. Wrote down for pt how to view pre op video and encouraged her to watch with family. Pt knows she will need someone with her 24/7 first week. We will follow up after surgery unless orders given to walk pre op. Graylon Good RN BSN 10/21/2013 3:11 PM

## 2013-10-22 ENCOUNTER — Inpatient Hospital Stay (HOSPITAL_COMMUNITY): Payer: Medicare HMO

## 2013-10-22 DIAGNOSIS — N189 Chronic kidney disease, unspecified: Secondary | ICD-10-CM

## 2013-10-22 LAB — BASIC METABOLIC PANEL
BUN: 32 mg/dL — ABNORMAL HIGH (ref 6–23)
BUN: 37 mg/dL — ABNORMAL HIGH (ref 6–23)
CALCIUM: 9.1 mg/dL (ref 8.4–10.5)
CHLORIDE: 99 meq/L (ref 96–112)
CO2: 26 mEq/L (ref 19–32)
CO2: 23 mEq/L (ref 19–32)
CREATININE: 1.79 mg/dL — AB (ref 0.50–1.10)
Calcium: 8.9 mg/dL (ref 8.4–10.5)
Chloride: 95 mEq/L — ABNORMAL LOW (ref 96–112)
Creatinine, Ser: 1.9 mg/dL — ABNORMAL HIGH (ref 0.50–1.10)
GFR calc Af Amer: 31 mL/min — ABNORMAL LOW (ref >90)
GFR calc non Af Amer: 27 mL/min — ABNORMAL LOW (ref >90)
GFR, EST AFRICAN AMERICAN: 29 mL/min — AB (ref >90)
GFR, EST NON AFRICAN AMERICAN: 25 mL/min — AB (ref >90)
GLUCOSE: 116 mg/dL — AB (ref 70–99)
Glucose, Bld: 105 mg/dL — ABNORMAL HIGH (ref 70–99)
POTASSIUM: 4.1 meq/L (ref 3.7–5.3)
Potassium: 4.9 mEq/L (ref 3.7–5.3)
SODIUM: 137 meq/L (ref 137–147)
Sodium: 134 mEq/L — ABNORMAL LOW (ref 137–147)

## 2013-10-22 LAB — CBC
HCT: 28.4 % — ABNORMAL LOW (ref 36.0–46.0)
Hemoglobin: 9.6 g/dL — ABNORMAL LOW (ref 12.0–15.0)
MCH: 27.2 pg (ref 26.0–34.0)
MCHC: 33.8 g/dL (ref 30.0–36.0)
MCV: 80.5 fL (ref 78.0–100.0)
PLATELETS: 193 10*3/uL (ref 150–400)
RBC: 3.53 MIL/uL — ABNORMAL LOW (ref 3.87–5.11)
RDW: 15 % (ref 11.5–15.5)
WBC: 9.4 10*3/uL (ref 4.0–10.5)

## 2013-10-22 LAB — GLUCOSE, CAPILLARY
GLUCOSE-CAPILLARY: 126 mg/dL — AB (ref 70–99)
GLUCOSE-CAPILLARY: 135 mg/dL — AB (ref 70–99)

## 2013-10-22 LAB — PRO B NATRIURETIC PEPTIDE: PRO B NATRI PEPTIDE: 13837 pg/mL — AB (ref 0–125)

## 2013-10-22 LAB — HEPARIN LEVEL (UNFRACTIONATED)
HEPARIN UNFRACTIONATED: 0.3 [IU]/mL (ref 0.30–0.70)
Heparin Unfractionated: 0.36 IU/mL (ref 0.30–0.70)

## 2013-10-22 MED ORDER — METOPROLOL TARTRATE 12.5 MG HALF TABLET
12.5000 mg | ORAL_TABLET | Freq: Once | ORAL | Status: DC
  Filled 2013-10-22: qty 1

## 2013-10-22 MED ORDER — BISACODYL 5 MG PO TBEC: 5.0000 mg | DELAYED_RELEASE_TABLET | Freq: Once | ORAL | Status: DC

## 2013-10-22 MED ORDER — CHLORHEXIDINE GLUCONATE 4 % EX LIQD
60.0000 mL | Freq: Once | CUTANEOUS | Status: AC
  Administered 2013-10-22: 4 via TOPICAL
  Filled 2013-10-22: qty 60

## 2013-10-22 MED ORDER — VANCOMYCIN HCL 10 G IV SOLR: 1250.0000 mg | INTRAVENOUS | Status: DC

## 2013-10-22 MED ORDER — INSULIN REGULAR HUMAN 100 UNIT/ML IJ SOLN
INTRAMUSCULAR | Status: AC
  Administered 2013-10-23: 1 [IU]/h via INTRAVENOUS
  Filled 2013-10-22: qty 1

## 2013-10-22 MED ORDER — SODIUM CHLORIDE 0.9 % IV SOLN
INTRAVENOUS | Status: AC
  Administered 2013-10-23: 10:00:00
  Filled 2013-10-22: qty 1000

## 2013-10-22 MED ORDER — HEPARIN SODIUM (PORCINE) 1000 UNIT/ML IJ SOLN
INTRAMUSCULAR | Status: DC
  Filled 2013-10-22: qty 30

## 2013-10-22 MED ORDER — MAGNESIUM SULFATE 50 % IJ SOLN
40.0000 meq | INTRAMUSCULAR | Status: DC
  Filled 2013-10-22: qty 10

## 2013-10-22 MED ORDER — EPINEPHRINE HCL 1 MG/ML IJ SOLN
0.5000 ug/min | INTRAMUSCULAR | Status: DC
  Filled 2013-10-22: qty 4

## 2013-10-22 MED ORDER — DEXMEDETOMIDINE HCL IN NACL 400 MCG/100ML IV SOLN
0.1000 ug/kg/h | INTRAVENOUS | Status: AC
  Administered 2013-10-23: 0.2 ug/kg/h via INTRAVENOUS
  Filled 2013-10-22: qty 100

## 2013-10-22 MED ORDER — DEXTROSE 5 % IV SOLN
750.0000 mg | INTRAVENOUS | Status: DC
  Filled 2013-10-22: qty 750

## 2013-10-22 MED ORDER — NITROGLYCERIN IN D5W 200-5 MCG/ML-% IV SOLN
2.0000 ug/min | INTRAVENOUS | Status: DC
  Filled 2013-10-22: qty 250

## 2013-10-22 MED ORDER — TEMAZEPAM 15 MG PO CAPS
15.0000 mg | ORAL_CAPSULE | Freq: Once | ORAL | Status: AC | PRN
  Administered 2013-10-22: 15 mg via ORAL
  Filled 2013-10-22: qty 1

## 2013-10-22 MED ORDER — DEXTROSE 5 % IV SOLN
1.5000 g | INTRAVENOUS | Status: AC
  Administered 2013-10-23: 1.5 g via INTRAVENOUS
  Administered 2013-10-23: .75 g via INTRAVENOUS
  Filled 2013-10-22: qty 1.5

## 2013-10-22 MED ORDER — PLASMA-LYTE 148 IV SOLN
INTRAVENOUS | Status: AC
  Administered 2013-10-23: 10:00:00
  Filled 2013-10-22: qty 2.5

## 2013-10-22 MED ORDER — HEPARIN SODIUM (PORCINE) 1000 UNIT/ML IJ SOLN
INTRAMUSCULAR | Status: AC
  Filled 2013-10-22: qty 3

## 2013-10-22 MED ORDER — SODIUM CHLORIDE 0.9 % IV SOLN
INTRAVENOUS | Status: AC
  Administered 2013-10-23: 70 mL via INTRAVENOUS
  Filled 2013-10-22: qty 40

## 2013-10-22 MED ORDER — DOPAMINE-DEXTROSE 3.2-5 MG/ML-% IV SOLN
2.0000 ug/kg/min | INTRAVENOUS | Status: AC
  Administered 2013-10-23: 3 ug/kg/min via INTRAVENOUS
  Filled 2013-10-22: qty 250

## 2013-10-22 MED ORDER — PHENYLEPHRINE HCL 10 MG/ML IJ SOLN
30.0000 ug/min | INTRAVENOUS | Status: AC
  Administered 2013-10-23: 50 ug/min via INTRAVENOUS
  Filled 2013-10-22: qty 2

## 2013-10-22 MED ORDER — CHLORHEXIDINE GLUCONATE 4 % EX LIQD
60.0000 mL | Freq: Once | CUTANEOUS | Status: AC
  Administered 2013-10-23: 4 via TOPICAL
  Filled 2013-10-22: qty 60

## 2013-10-22 MED ORDER — VANCOMYCIN HCL 10 G IV SOLR
1500.0000 mg | INTRAVENOUS | Status: AC
  Administered 2013-10-23: 1250 mg via INTRAVENOUS
  Filled 2013-10-22: qty 1500

## 2013-10-22 MED ORDER — POTASSIUM CHLORIDE 2 MEQ/ML IV SOLN
80.0000 meq | INTRAVENOUS | Status: DC
Start: 2013-10-23 — End: 2013-10-23
  Filled 2013-10-22: qty 40

## 2013-10-22 NOTE — Progress Notes (Signed)
SUBJECTIVE:  No chest pain.    PHYSICAL EXAM Filed Vitals:   10/22/13 0700 10/22/13 0733 10/22/13 0800 10/22/13 0900  BP: 85/33  93/54 105/75  Pulse: 82  78 84  Temp:  98 F (36.7 C)    TempSrc:  Oral    Resp: 18  20 18   Height:      Weight:      SpO2: 99%  99% 97%   General:  No distress Lungs:  No JVD at 45 degrees Heart:  RRR Abdomen:  Positive bowel sounds, no rebound no guarding Extremities:  No edema Neuro:  Nonfocal.    LABS: Lab Results  Component Value Date   TROPONINI >20.00* 10/21/2013   Results for orders placed during the hospital encounter of 10/20/13 (from the past 24 hour(s))  GLUCOSE, CAPILLARY     Status: Abnormal   Collection Time    10/21/13 11:24 AM      Result Value Ref Range   Glucose-Capillary 139 (*) 70 - 99 mg/dL   Comment 1 Documented in Chart     Comment 2 Notify RN    HEPARIN LEVEL (UNFRACTIONATED)     Status: None   Collection Time    10/21/13  2:18 PM      Result Value Ref Range   Heparin Unfractionated 0.33  0.30 - 0.70 IU/mL  GLUCOSE, CAPILLARY     Status: Abnormal   Collection Time    10/21/13  4:27 PM      Result Value Ref Range   Glucose-Capillary 112 (*) 70 - 99 mg/dL   Comment 1 Documented in Chart     Comment 2 Notify RN    GLUCOSE, CAPILLARY     Status: Abnormal   Collection Time    10/21/13  9:32 PM      Result Value Ref Range   Glucose-Capillary 153 (*) 70 - 99 mg/dL   Comment 1 Documented in Chart     Comment 2 Notify RN    HEPARIN LEVEL (UNFRACTIONATED)     Status: None   Collection Time    10/21/13 10:24 PM      Result Value Ref Range   Heparin Unfractionated 0.30  0.30 - 0.70 IU/mL  HEPARIN LEVEL (UNFRACTIONATED)     Status: None   Collection Time    10/22/13  2:29 AM      Result Value Ref Range   Heparin Unfractionated 0.36  0.30 - 0.70 IU/mL  CBC     Status: Abnormal   Collection Time    10/22/13  2:29 AM      Result Value Ref Range   WBC 9.4  4.0 - 10.5 K/uL   RBC 3.53 (*) 3.87 - 5.11 MIL/uL   Hemoglobin 9.6 (*) 12.0 - 15.0 g/dL   HCT 28.4 (*) 36.0 - 46.0 %   MCV 80.5  78.0 - 100.0 fL   MCH 27.2  26.0 - 34.0 pg   MCHC 33.8  30.0 - 36.0 g/dL   RDW 15.0  11.5 - 15.5 %   Platelets 193  150 - 400 K/uL  BASIC METABOLIC PANEL     Status: Abnormal   Collection Time    10/22/13  2:29 AM      Result Value Ref Range   Sodium 137  137 - 147 mEq/L   Potassium 4.1  3.7 - 5.3 mEq/L   Chloride 99  96 - 112 mEq/L   CO2 26  19 - 32 mEq/L   Glucose, Bld  105 (*) 70 - 99 mg/dL   BUN 32 (*) 6 - 23 mg/dL   Creatinine, Ser 1.90 (*) 0.50 - 1.10 mg/dL   Calcium 8.9  8.4 - 10.5 mg/dL   GFR calc non Af Amer 25 (*) >90 mL/min   GFR calc Af Amer 29 (*) >90 mL/min  PRO B NATRIURETIC PEPTIDE     Status: Abnormal   Collection Time    10/22/13  2:29 AM      Result Value Ref Range   Pro B Natriuretic peptide (BNP) 13837.0 (*) 0 - 125 pg/mL  GLUCOSE, CAPILLARY     Status: Abnormal   Collection Time    10/22/13  7:34 AM      Result Value Ref Range   Glucose-Capillary 126 (*) 70 - 99 mg/dL    Intake/Output Summary (Last 24 hours) at 10/22/13 0932 Last data filed at 10/22/13 0900  Gross per 24 hour  Intake    548 ml  Output   1485 ml  Net   -937 ml    ASSESSMENT AND PLAN:  CAD:  Possible CABG in AM pending renal function.    ISCHEMIC CARDIOMYOPATHY WITH ACUTE ON CHRONIC SYSTOLIC AND DIASTOLIC HF:    Creat is up with diuresis.  ProBNP is up but clinically she is not worse.   Agree with holding ACE and diuretic today.  I suspect that the creat will continue to climb for awhile.    ATRIAL FIB:   She is in NSR.    DM:  Blood sugars OK on current therapy.  Continue.      Jeneen Rinks [facility] 10/22/2013 9:32 AM

## 2013-10-22 NOTE — Progress Notes (Signed)
[facility] BRIEF SICU PROGRESS NOTE  2 Days Post-Op  S/P Procedure(s) (LRB): LEFT HEART CATHETERIZATION WITH CORONARY ANGIOGRAM (N/A)   Stable day BP improved [id number] systolic Creatinine stable 1.8  Plan: Will still tentatively plan high risk CABG tomorrow as long as renal function stable/improved  [name],[name] 10/22/2013 6:10 PM

## 2013-10-22 NOTE — Progress Notes (Signed)
ANTICOAGULATION CONSULT NOTE - Follow Up Consult  Pharmacy Consult for Heparin Indication: multivessel CAD  Allergies  Allergen Reactions  . Tape Rash    Adhesive tape    Patient Measurements: Height: 5' 1.81" (157 cm) Weight: 249 lb 9.6 oz (113.218 kg) IBW/kg (Calculated) : 49.67 Heparin Dosing Weight: 72.8kg  Vital Signs: Temp: 98 F (36.7 C) (02/25 0733) Temp src: Oral (02/25 0733) BP: 85/33 mmHg (02/25 0700) Pulse Rate: 82 (02/25 0700)  Labs:  Recent Labs  10/20/13 1305 10/20/13 1313 10/20/13 1632 10/20/13 2103  10/21/13 0307 10/21/13 1418 10/21/13 2224 10/22/13 0229  HGB 11.1* 12.6  --   --   --  10.6*  --   --  9.6*  HCT 33.1* 37.0  --   --   --  31.1*  --   --  28.4*  PLT 288  --   --   --   --  250  --   --  193  APTT 143*  --   --   --   --  47*  --   --   --   LABPROT 14.8  --  13.4  --   --  13.1  --   --   --   INR 1.19  --  1.04  --   --  1.01  --   --   --   HEPARINUNFRC  --   --   --   --   < > 0.21* 0.33 0.30 0.36  CREATININE 1.52* 1.40* 1.50*  --   --  1.42*  --   --  1.90*  CKTOTAL 645*  --   --   --   --   --   --   --   --   CKMB 68.1*  --   --   --   --   --   --   --   --   TROPONINI 15.33*  --  >20.00* >20.00*  --  >20.00*  --   --   --   < > = values in this interval not displayed.  Estimated Creatinine Clearance: 31.7 ml/min (by C-G formula based on Cr of 1.9).   Medications:  Heparin @ 1300 units/hr  Assessment: 72yof started on heparin post urgent cath for multivessel disease. She will have CABG later this week (possibly tomorrow). Heparin level is therapeutic. Hgb/Hct/Plts trending down. No bleeding reported.  Goal of Therapy:  Heparin level 0.3-0.7 units/ml Monitor platelets by anticoagulation protocol: Yes   Plan:  1) Continue heparin at 1300 units/hr 2) Follow up heparin level, CBC in AM  Deboraha Sprang 10/22/2013,9:14 AM

## 2013-10-22 NOTE — Progress Notes (Signed)
      [address]       [place] [address]             [phone]        CARDIOTHORACIC SURGERY PROGRESS NOTE   R2 Days Post-Op Procedure(s) (LRB): LEFT HEART CATHETERIZATION WITH CORONARY ANGIOGRAM (N/A)  Subjective: Feels well.  Slept well last night.  No chest pain.  Denies SOB  Objective: Vital signs: BP Readings from Last 1 Encounters:  10/22/13 105/75   Pulse Readings from Last 1 Encounters:  10/22/13 84   Resp Readings from Last 1 Encounters:  10/22/13 18   Temp Readings from Last 1 Encounters:  10/22/13 98 F (36.7 C) Oral    Hemodynamics: PAP: (52)/(30) 52/30 mmHg  Physical Exam:  Rhythm:   sinus  Breath sounds: clear  Heart sounds:  RRR  Incisions:  n/a  Abdomen:  soft  Extremities:  Warm, adequately perfused   Intake/Output from previous day: 02/24 0701 - 02/25 0700 In: 608 [I.V.:608] Out: 1575 [Urine:1575] Intake/Output this shift: Total I/O In: 20 [I.V.:20] Out: -   Lab Results:  CBC: Recent Labs  10/21/13 0307 10/22/13 0229  WBC 10.5 9.4  HGB 10.6* 9.6*  HCT 31.1* 28.4*  PLT 250 193    BMET:  Recent Labs  10/21/13 0307 10/22/13 0229  NA 140 137  K 3.5* 4.1  CL 99 99  CO2 26 26  GLUCOSE 153* 105*  BUN 25* 32*  CREATININE 1.42* 1.90*  CALCIUM 9.5 8.9     CBG (last 3)   Recent Labs  10/21/13 1627 10/21/13 2132 10/22/13 0734  GLUCAP 112* 153* 126*    ABG    Component Value Date/Time   PHART 7.347* 10/20/2013 1336   PCO2ART 39.4 10/20/2013 1336   PO2ART 59.0* 10/20/2013 1336   HCO3 22.3 10/20/2013 1338   TCO2 24 10/20/2013 1338   ACIDBASEDEF 4.0* 10/20/2013 1338   O2SAT 66.0 10/21/2013 0415    CXR: PORTABLE CHEST - 1 VIEW  COMPARISON: 10/21/2013  FINDINGS:  Swan-Ganz catheter has been removed. Again noted are prominent  interstitial lung markings which are unchanged. Heart size is upper  limits of normal. No evidence for airspace disease.  IMPRESSION:  Prominent interstitial lung markings  suggest mild edema and  unchanged.  Removal of Swan-Ganz catheter.  Electronically Signed  By: Markus Daft M.D.  On: 10/22/2013 07:54   Assessment/Plan: S/P Procedure(s) (LRB): LEFT HEART CATHETERIZATION WITH CORONARY ANGIOGRAM (N/A)  Clinically stable and no further episodes of chest pain or Afib However, BP down overnight and creatinine increased to 1.9 Clinically she doesn't look to be hypoperfused at present, but she has SEVERE LV DYSFUNCTION Will hold ACE-I and lasix and recheck renal function this afternoon and tomorrow morning if okay with Cardiology Team Still tentatively plan CABG in am if renal function stabilizes/improves I would not favor transfer out of ICU at this time  [facility] 10/22/2013 9:18 AM

## 2013-10-22 NOTE — Progress Notes (Signed)
Spoke with MD Elias Else of Cardiology regarding pt's soft BP 80s-90s/40-50s. Pt currently a-symptomatic, resting comfortably, NSR in 70s. UOP adequate. No new orders received at this time. Will continue to monitor pt closely.

## 2013-10-22 NOTE — Progress Notes (Signed)
Unable to draw 0500 Carboxihemoglobin d/t pt only have peripheral access.

## 2013-10-22 NOTE — Progress Notes (Signed)
[place] for Heparin Indication: multivessel CAD  Allergies  Allergen Reactions  . Tape Rash    Adhesive tape    Patient Measurements: Height: 5' 1.81" (157 cm) Weight: 250 lb (113.399 kg) IBW/kg (Calculated) : 49.67 Heparin Dosing Weight: 72.8kg  Vital Signs: Temp: 98.6 F (37 C) (02/24 2342) Temp src: Oral (02/24 2342) BP: 85/54 mmHg (02/25 0000) Pulse Rate: 79 (02/25 0000)  Labs:  Recent Labs  10/20/13 1305 10/20/13 1313 10/20/13 1632 10/20/13 2103 10/21/13 0307 10/21/13 1418 10/21/13 2224  HGB 11.1* 12.6  --   --  10.6*  --   --   HCT 33.1* 37.0  --   --  31.1*  --   --   PLT 288  --   --   --  250  --   --   APTT 143*  --   --   --  47*  --   --   LABPROT 14.8  --  13.4  --  13.1  --   --   INR 1.19  --  1.04  --  1.01  --   --   HEPARINUNFRC  --   --   --   --  0.21* 0.33 0.30  CREATININE 1.52* 1.40* 1.50*  --  1.42*  --   --   CKTOTAL 645*  --   --   --   --   --   --   CKMB 68.1*  --   --   --   --   --   --   TROPONINI 15.33*  --  >20.00* >20.00* >20.00*  --   --     Estimated Creatinine Clearance: 42.5 ml/min (by C-G formula based on Cr of 1.42).  Assessment: 72 yo female with CAD awaiting CABG for heparin   Goal of Therapy:  Heparin level 0.3-0.7 units/ml Monitor platelets by anticoagulation protocol: Yes   Plan:  Continue Heparin at current rate Follow-up am labs.   Ludella Pranger, Bronson Curb 10/22/2013,12:25 AM

## 2013-10-23 ENCOUNTER — Inpatient Hospital Stay (HOSPITAL_COMMUNITY): Payer: Medicare HMO | Admitting: Anesthesiology

## 2013-10-23 ENCOUNTER — Inpatient Hospital Stay (HOSPITAL_COMMUNITY): Payer: Medicare HMO

## 2013-10-23 ENCOUNTER — Encounter (HOSPITAL_COMMUNITY): Payer: Self-pay | Admitting: Anesthesiology

## 2013-10-23 ENCOUNTER — Encounter (HOSPITAL_COMMUNITY)
Admission: EM | Disposition: A | Discharge status: Rehabilitation Facility | Payer: Medicare HMO | Source: Home / Self Care | Attending: Thoracic Surgery (Cardiothoracic Vascular Surgery)

## 2013-10-23 DIAGNOSIS — Z951 Presence of aortocoronary bypass graft: Secondary | ICD-10-CM

## 2013-10-23 DIAGNOSIS — Z860100 Personal history of colon polyps, unspecified: Secondary | ICD-10-CM

## 2013-10-23 DIAGNOSIS — I2510 Atherosclerotic heart disease of native coronary artery without angina pectoris: Secondary | ICD-10-CM

## 2013-10-23 HISTORY — PX: INTRAOPERATIVE TRANSESOPHAGEAL ECHOCARDIOGRAM: SHX5062

## 2013-10-23 HISTORY — PX: CORONARY ARTERY BYPASS GRAFT: SHX141

## 2013-10-23 HISTORY — PX: CLIPPING OF ATRIAL APPENDAGE: SHX5773

## 2013-10-23 HISTORY — DX: Presence of aortocoronary bypass graft: Z95.1

## 2013-10-23 LAB — BASIC METABOLIC PANEL
BUN: 38 mg/dL — ABNORMAL HIGH (ref 6–23)
CHLORIDE: 99 meq/L (ref 96–112)
CO2: 28 meq/L (ref 19–32)
Calcium: 9.3 mg/dL (ref 8.4–10.5)
Creatinine, Ser: 1.96 mg/dL — ABNORMAL HIGH (ref 0.50–1.10)
GFR calc Af Amer: 28 mL/min — ABNORMAL LOW (ref >90)
GFR calc non Af Amer: 24 mL/min — ABNORMAL LOW (ref >90)
Glucose, Bld: 120 mg/dL — ABNORMAL HIGH (ref 70–99)
POTASSIUM: 5.1 meq/L (ref 3.7–5.3)
SODIUM: 138 meq/L (ref 137–147)

## 2013-10-23 LAB — POCT I-STAT 4, (NA,K, GLUC, HGB,HCT)
GLUCOSE: 140 mg/dL — AB (ref 70–99)
GLUCOSE: 126 mg/dL — AB (ref 70–99)
Glucose, Bld: 127 mg/dL — ABNORMAL HIGH (ref 70–99)
Glucose, Bld: 126 mg/dL — ABNORMAL HIGH (ref 70–99)
Glucose, Bld: 176 mg/dL — ABNORMAL HIGH (ref 70–99)
Glucose, Bld: 165 mg/dL — ABNORMAL HIGH (ref 70–99)
HCT: 26 % — ABNORMAL LOW (ref 36.0–46.0)
HCT: 26 % — ABNORMAL LOW (ref 36.0–46.0)
HCT: 26 % — ABNORMAL LOW (ref 36.0–46.0)
HEMATOCRIT: 27 % — AB (ref 36.0–46.0)
HEMATOCRIT: 27 % — AB (ref 36.0–46.0)
HEMATOCRIT: 34 % — AB (ref 36.0–46.0)
HEMOGLOBIN: 8.8 g/dL — AB (ref 12.0–15.0)
HEMOGLOBIN: 11.6 g/dL — AB (ref 12.0–15.0)
Hemoglobin: 8.8 g/dL — ABNORMAL LOW (ref 12.0–15.0)
Hemoglobin: 9.2 g/dL — ABNORMAL LOW (ref 12.0–15.0)
Hemoglobin: 8.8 g/dL — ABNORMAL LOW (ref 12.0–15.0)
Hemoglobin: 9.2 g/dL — ABNORMAL LOW (ref 12.0–15.0)
POTASSIUM: 4.1 meq/L (ref 3.7–5.3)
POTASSIUM: 5.2 meq/L (ref 3.7–5.3)
POTASSIUM: 4.8 meq/L (ref 3.7–5.3)
Potassium: 3.7 mEq/L (ref 3.7–5.3)
Potassium: 4.5 mEq/L (ref 3.7–5.3)
Potassium: 4.1 mEq/L (ref 3.7–5.3)
SODIUM: 136 meq/L — AB (ref 137–147)
SODIUM: 136 meq/L — AB (ref 137–147)
Sodium: 137 mEq/L (ref 137–147)
Sodium: 134 mEq/L — ABNORMAL LOW (ref 137–147)
Sodium: 136 mEq/L — ABNORMAL LOW (ref 137–147)
Sodium: 137 mEq/L (ref 137–147)

## 2013-10-23 LAB — CBC
HCT: 28.8 % — ABNORMAL LOW (ref 36.0–46.0)
HCT: 30.9 % — ABNORMAL LOW (ref 36.0–46.0)
HEMATOCRIT: 26.9 % — AB (ref 36.0–46.0)
HEMOGLOBIN: 10.7 g/dL — AB (ref 12.0–15.0)
HEMOGLOBIN: 9.2 g/dL — AB (ref 12.0–15.0)
Hemoglobin: 9.6 g/dL — ABNORMAL LOW (ref 12.0–15.0)
MCH: 27 pg (ref 26.0–34.0)
MCH: 28.6 pg (ref 26.0–34.0)
MCH: 27.8 pg (ref 26.0–34.0)
MCHC: 33.3 g/dL (ref 30.0–36.0)
MCHC: 34.6 g/dL (ref 30.0–36.0)
MCHC: 34.2 g/dL (ref 30.0–36.0)
MCV: 80.9 fL (ref 78.0–100.0)
MCV: 82.6 fL (ref 78.0–100.0)
MCV: 81.3 fL (ref 78.0–100.0)
PLATELETS: 200 10*3/uL (ref 150–400)
Platelets: 150 10*3/uL (ref 150–400)
Platelets: 124 10*3/uL — ABNORMAL LOW (ref 150–400)
RBC: 3.56 MIL/uL — AB (ref 3.87–5.11)
RBC: 3.74 MIL/uL — ABNORMAL LOW (ref 3.87–5.11)
RBC: 3.31 MIL/uL — AB (ref 3.87–5.11)
RDW: 14.9 % (ref 11.5–15.5)
RDW: 15.1 % (ref 11.5–15.5)
RDW: 14.7 % (ref 11.5–15.5)
WBC: 10.1 10*3/uL (ref 4.0–10.5)
WBC: 15.1 10*3/uL — ABNORMAL HIGH (ref 4.0–10.5)
WBC: 10.7 10*3/uL — ABNORMAL HIGH (ref 4.0–10.5)

## 2013-10-23 LAB — HEMOGLOBIN AND HEMATOCRIT, BLOOD
HCT: 25.5 % — ABNORMAL LOW (ref 36.0–46.0)
HEMOGLOBIN: 8.7 g/dL — AB (ref 12.0–15.0)

## 2013-10-23 LAB — BLOOD GAS, ARTERIAL
Acid-Base Excess: 1.5 mmol/L (ref 0.0–2.0)
Bicarbonate: 25.8 mEq/L — ABNORMAL HIGH (ref 20.0–24.0)
Drawn by: 36496
O2 CONTENT: 2 L/min
O2 Saturation: 89.1 %
PATIENT TEMPERATURE: 98.6
PH ART: 7.397 (ref 7.350–7.450)
TCO2: 27.1 mmol/L (ref 0–100)
pCO2 arterial: 42.8 mmHg (ref 35.0–45.0)
pO2, Arterial: 59.8 mmHg — ABNORMAL LOW (ref 80.0–100.0)

## 2013-10-23 LAB — POCT I-STAT 3, ART BLOOD GAS (G3+)
ACID-BASE DEFICIT: 3 mmol/L — AB (ref 0.0–2.0)
ACID-BASE EXCESS: 1 mmol/L (ref 0.0–2.0)
BICARBONATE: 25.9 meq/L — AB (ref 20.0–24.0)
Bicarbonate: 25.6 mEq/L — ABNORMAL HIGH (ref 20.0–24.0)
Bicarbonate: 23.3 mEq/L (ref 20.0–24.0)
O2 SAT: 88 %
O2 Saturation: 100 %
O2 Saturation: 100 %
PCO2 ART: 43 mmHg (ref 35.0–45.0)
PCO2 ART: 42.5 mmHg (ref 35.0–45.0)
PCO2 ART: 42.8 mmHg (ref 35.0–45.0)
PH ART: 7.382 (ref 7.350–7.450)
PO2 ART: 299 mmHg — AB (ref 80.0–100.0)
Patient temperature: 36
TCO2: 27 mmol/L (ref 0–100)
TCO2: 27 mmol/L (ref 0–100)
TCO2: 25 mmol/L (ref 0–100)
pH, Arterial: 7.393 (ref 7.350–7.450)
pH, Arterial: 7.34 — ABNORMAL LOW (ref 7.350–7.450)
pO2, Arterial: 331 mmHg — ABNORMAL HIGH (ref 80.0–100.0)
pO2, Arterial: 55 mmHg — ABNORMAL LOW (ref 80.0–100.0)

## 2013-10-23 LAB — APTT: aPTT: 30 seconds (ref 24–37)

## 2013-10-23 LAB — POCT I-STAT, CHEM 8
BUN: 29 mg/dL — ABNORMAL HIGH (ref 6–23)
CALCIUM ION: 1.26 mmol/L (ref 1.13–1.30)
CHLORIDE: 102 meq/L (ref 96–112)
Creatinine, Ser: 1.6 mg/dL — ABNORMAL HIGH (ref 0.50–1.10)
GLUCOSE: 120 mg/dL — AB (ref 70–99)
HCT: 28 % — ABNORMAL LOW (ref 36.0–46.0)
Hemoglobin: 9.5 g/dL — ABNORMAL LOW (ref 12.0–15.0)
Potassium: 4.1 mEq/L (ref 3.7–5.3)
Sodium: 137 mEq/L (ref 137–147)
TCO2: 23 mmol/L (ref 0–100)

## 2013-10-23 LAB — PROTIME-INR
INR: 1.27 (ref 0.00–1.49)
PROTHROMBIN TIME: 15.6 s — AB (ref 11.6–15.2)

## 2013-10-23 LAB — GLUCOSE, CAPILLARY
Glucose-Capillary: 110 mg/dL — ABNORMAL HIGH (ref 70–99)
Glucose-Capillary: 98 mg/dL (ref 70–99)
Glucose-Capillary: 135 mg/dL — ABNORMAL HIGH (ref 70–99)
Glucose-Capillary: 146 mg/dL — ABNORMAL HIGH (ref 70–99)
Glucose-Capillary: 150 mg/dL — ABNORMAL HIGH (ref 70–99)

## 2013-10-23 LAB — MAGNESIUM: Magnesium: 3 mg/dL — ABNORMAL HIGH (ref 1.5–2.5)

## 2013-10-23 LAB — CREATININE, SERUM
CREATININE: 1.6 mg/dL — AB (ref 0.50–1.10)
GFR calc Af Amer: 36 mL/min — ABNORMAL LOW (ref >90)
GFR, EST NON AFRICAN AMERICAN: 31 mL/min — AB (ref >90)

## 2013-10-23 LAB — PLATELET COUNT: Platelets: 143 10*3/uL — ABNORMAL LOW (ref 150–400)

## 2013-10-23 LAB — PREPARE RBC (CROSSMATCH)

## 2013-10-23 LAB — HEPARIN LEVEL (UNFRACTIONATED): Heparin Unfractionated: 0.45 IU/mL (ref 0.30–0.70)

## 2013-10-23 SURGERY — CORONARY ARTERY BYPASS GRAFTING (CABG)
Anesthesia: General | Site: Chest

## 2013-10-23 MED ORDER — ARTIFICIAL TEARS OP OINT
TOPICAL_OINTMENT | OPHTHALMIC | Status: DC | PRN
  Administered 2013-10-23: 1 via OPHTHALMIC

## 2013-10-23 MED ORDER — NITROGLYCERIN IN D5W 200-5 MCG/ML-% IV SOLN: 0.0000 ug/min | INTRAVENOUS | Status: DC

## 2013-10-23 MED ORDER — PROPOFOL 10 MG/ML IV BOLUS
INTRAVENOUS | Status: AC
  Filled 2013-10-23: qty 20

## 2013-10-23 MED ORDER — OXYCODONE HCL 5 MG PO TABS
5.0000 mg | ORAL_TABLET | ORAL | Status: DC | PRN
  Administered 2013-10-24: 5 mg via ORAL
  Administered 2013-10-25: 10 mg via ORAL
  Administered 2013-10-26: 5 mg via ORAL
  Filled 2013-10-23 (×2): qty 2
  Filled 2013-10-23 (×2): qty 1

## 2013-10-23 MED ORDER — MILRINONE IN DEXTROSE 20 MG/100ML IV SOLN
0.1250 ug/kg/min | INTRAVENOUS | Status: DC
  Filled 2013-10-23: qty 100

## 2013-10-23 MED ORDER — METOPROLOL TARTRATE 12.5 MG HALF TABLET
12.5000 mg | ORAL_TABLET | Freq: Two times a day (BID) | ORAL | Status: DC
  Filled 2013-10-23 (×5): qty 1

## 2013-10-23 MED ORDER — VANCOMYCIN HCL IN DEXTROSE 1-5 GM/200ML-% IV SOLN
1000.0000 mg | Freq: Once | INTRAVENOUS | Status: AC
  Administered 2013-10-23: 1000 mg via INTRAVENOUS
  Filled 2013-10-23: qty 200

## 2013-10-23 MED ORDER — MAGNESIUM SULFATE 4000MG/100ML IJ SOLN
4.0000 g | Freq: Once | INTRAMUSCULAR | Status: AC
  Administered 2013-10-23: 4 g via INTRAVENOUS
  Filled 2013-10-23: qty 100

## 2013-10-23 MED ORDER — SODIUM CHLORIDE 0.9 % IJ SOLN
OROMUCOSAL | Status: DC | PRN
  Administered 2013-10-23 (×5): via TOPICAL

## 2013-10-23 MED ORDER — AMIODARONE IV BOLUS ONLY 150 MG/100ML: 150.0000 mg | Freq: Once | INTRAVENOUS | Status: DC

## 2013-10-23 MED ORDER — LACTATED RINGERS IV SOLN
INTRAVENOUS | Status: DC | PRN
  Administered 2013-10-23: 07:00:00 via INTRAVENOUS

## 2013-10-23 MED ORDER — FENTANYL CITRATE 0.05 MG/ML IJ SOLN
INTRAMUSCULAR | Status: AC
  Filled 2013-10-23: qty 5

## 2013-10-23 MED ORDER — AMIODARONE LOAD VIA INFUSION
150.0000 mg | Freq: Once | INTRAVENOUS | Status: AC
  Administered 2013-10-23: 150 mg via INTRAVENOUS
  Filled 2013-10-23: qty 83.34

## 2013-10-23 MED ORDER — METOPROLOL TARTRATE 25 MG/10 ML ORAL SUSPENSION
12.5000 mg | Freq: Two times a day (BID) | ORAL | Status: DC
  Filled 2013-10-23 (×3): qty 5

## 2013-10-23 MED ORDER — PANTOPRAZOLE SODIUM 40 MG PO TBEC
40.0000 mg | DELAYED_RELEASE_TABLET | Freq: Every day | ORAL | Status: DC
  Administered 2013-10-25 – 2013-11-05 (×12): 40 mg via ORAL
  Filled 2013-10-23 (×8): qty 1

## 2013-10-23 MED ORDER — PROTAMINE SULFATE 10 MG/ML IV SOLN
INTRAVENOUS | Status: AC
  Filled 2013-10-23: qty 5

## 2013-10-23 MED ORDER — MORPHINE SULFATE 2 MG/ML IJ SOLN: 1.0000 mg | INTRAMUSCULAR | Status: AC | PRN

## 2013-10-23 MED ORDER — MILRINONE IN DEXTROSE 20 MG/100ML IV SOLN
INTRAVENOUS | Status: DC | PRN
  Administered 2013-10-23: .33 ug/kg/min via INTRAVENOUS

## 2013-10-23 MED ORDER — INSULIN REGULAR BOLUS VIA INFUSION
0.0000 [IU] | Freq: Three times a day (TID) | INTRAVENOUS | Status: AC
  Filled 2013-10-23: qty 10

## 2013-10-23 MED ORDER — PROPOFOL 10 MG/ML IV BOLUS
INTRAVENOUS | Status: DC | PRN
  Administered 2013-10-23: 50 mg via INTRAVENOUS

## 2013-10-23 MED ORDER — CALCIUM CHLORIDE 10 % IV SOLN
INTRAVENOUS | Status: DC | PRN
  Administered 2013-10-23 (×2): 250 mg via INTRAVENOUS

## 2013-10-23 MED ORDER — PROTAMINE SULFATE 10 MG/ML IV SOLN
INTRAVENOUS | Status: AC
  Filled 2013-10-23: qty 25

## 2013-10-23 MED ORDER — HEPARIN SODIUM (PORCINE) 1000 UNIT/ML IJ SOLN
INTRAMUSCULAR | Status: AC
  Filled 2013-10-23: qty 1

## 2013-10-23 MED ORDER — ALBUMIN HUMAN 5 % IV SOLN
INTRAVENOUS | Status: DC | PRN
  Administered 2013-10-23 (×2): via INTRAVENOUS

## 2013-10-23 MED ORDER — LIDOCAINE HCL (CARDIAC) 20 MG/ML IV SOLN
INTRAVENOUS | Status: AC
  Filled 2013-10-23: qty 5

## 2013-10-23 MED ORDER — DOPAMINE-DEXTROSE 3.2-5 MG/ML-% IV SOLN
3.0000 ug/kg/min | INTRAVENOUS | Status: DC
  Administered 2013-10-24 – 2013-10-26 (×2): 3 ug/kg/min via INTRAVENOUS
  Administered 2013-10-27: 1.5 ug/kg/min via INTRAVENOUS
  Filled 2013-10-23 (×3): qty 250

## 2013-10-23 MED ORDER — ALBUMIN HUMAN 5 % IV SOLN
250.0000 mL | INTRAVENOUS | Status: AC | PRN
  Administered 2013-10-23 (×4): 250 mL via INTRAVENOUS
  Filled 2013-10-23 (×2): qty 250

## 2013-10-23 MED ORDER — LIDOCAINE HCL (CARDIAC) 20 MG/ML IV SOLN
INTRAVENOUS | Status: DC | PRN
  Administered 2013-10-23: 50 mg via INTRAVENOUS

## 2013-10-23 MED ORDER — BISACODYL 5 MG PO TBEC
10.0000 mg | DELAYED_RELEASE_TABLET | Freq: Every day | ORAL | Status: DC
  Administered 2013-10-24 – 2013-10-31 (×7): 10 mg via ORAL
  Filled 2013-10-23 (×9): qty 2

## 2013-10-23 MED ORDER — SODIUM CHLORIDE 0.9 % IJ SOLN
3.0000 mL | Freq: Two times a day (BID) | INTRAMUSCULAR | Status: DC
Start: 2013-10-24 — End: 2013-11-05
  Administered 2013-10-24 – 2013-11-01 (×4): 3 mL via INTRAVENOUS

## 2013-10-23 MED ORDER — CALCIUM CHLORIDE 10 % IV SOLN
INTRAVENOUS | Status: AC
  Filled 2013-10-23: qty 10

## 2013-10-23 MED ORDER — MIDAZOLAM HCL 10 MG/2ML IJ SOLN
INTRAMUSCULAR | Status: AC
  Filled 2013-10-23: qty 2

## 2013-10-23 MED ORDER — ETOMIDATE 2 MG/ML IV SOLN
INTRAVENOUS | Status: AC
  Filled 2013-10-23: qty 10

## 2013-10-23 MED ORDER — DEXMEDETOMIDINE HCL IN NACL 200 MCG/50ML IV SOLN
0.1000 ug/kg/h | INTRAVENOUS | Status: DC
  Administered 2013-10-23 (×2): 0.7 ug/kg/h via INTRAVENOUS
  Filled 2013-10-23 (×2): qty 50

## 2013-10-23 MED ORDER — PROTAMINE SULFATE 10 MG/ML IV SOLN
INTRAVENOUS | Status: DC | PRN
  Administered 2013-10-23: 350 mg via INTRAVENOUS

## 2013-10-23 MED ORDER — LACTATED RINGERS IV SOLN: 500.0000 mL | Freq: Once | INTRAVENOUS | Status: AC | PRN

## 2013-10-23 MED ORDER — VECURONIUM BROMIDE 10 MG IV SOLR
INTRAVENOUS | Status: DC | PRN
  Administered 2013-10-23 (×2): 5 mg via INTRAVENOUS

## 2013-10-23 MED ORDER — SODIUM CHLORIDE 0.45 % IV SOLN
INTRAVENOUS | Status: DC
  Administered 2013-10-23: 20 mL/h via INTRAVENOUS

## 2013-10-23 MED ORDER — VECURONIUM BROMIDE 10 MG IV SOLR
INTRAVENOUS | Status: AC
  Filled 2013-10-23: qty 10

## 2013-10-23 MED ORDER — METOPROLOL TARTRATE 1 MG/ML IV SOLN
2.5000 mg | INTRAVENOUS | Status: DC | PRN
Start: 2013-10-23 — End: 2013-10-25
  Administered 2013-10-23: 5 mg via INTRAVENOUS

## 2013-10-23 MED ORDER — MILRINONE IN DEXTROSE 20 MG/100ML IV SOLN
0.3000 ug/kg/min | INTRAVENOUS | Status: DC
  Administered 2013-10-23 – 2013-10-25 (×5): 0.3 ug/kg/min via INTRAVENOUS
  Filled 2013-10-23 (×5): qty 100

## 2013-10-23 MED ORDER — FENTANYL CITRATE 0.05 MG/ML IJ SOLN
INTRAMUSCULAR | Status: DC | PRN
  Administered 2013-10-23 (×2): 250 ug via INTRAVENOUS
  Administered 2013-10-23 (×2): 150 ug via INTRAVENOUS
  Administered 2013-10-23 (×2): 100 ug via INTRAVENOUS
  Administered 2013-10-23: 250 ug via INTRAVENOUS

## 2013-10-23 MED ORDER — ROCURONIUM BROMIDE 50 MG/5ML IV SOLN
INTRAVENOUS | Status: AC
  Filled 2013-10-23: qty 1

## 2013-10-23 MED ORDER — POTASSIUM CHLORIDE 10 MEQ/50ML IV SOLN: 10.0000 meq | INTRAVENOUS | Status: AC

## 2013-10-23 MED ORDER — AMIODARONE HCL IN DEXTROSE 360-4.14 MG/200ML-% IV SOLN
30.0000 mg/h | INTRAVENOUS | Status: AC
  Administered 2013-10-23: 60 mg/h via INTRAVENOUS
  Administered 2013-10-24 – 2013-10-26 (×6): 30 mg/h via INTRAVENOUS
  Filled 2013-10-23 (×14): qty 200

## 2013-10-23 MED ORDER — PHENYLEPHRINE 40 MCG/ML (10ML) SYRINGE FOR IV PUSH (FOR BLOOD PRESSURE SUPPORT)
PREFILLED_SYRINGE | INTRAVENOUS | Status: AC
  Filled 2013-10-23: qty 10

## 2013-10-23 MED ORDER — FAMOTIDINE IN NACL 20-0.9 MG/50ML-% IV SOLN
20.0000 mg | Freq: Two times a day (BID) | INTRAVENOUS | Status: AC
  Administered 2013-10-23 (×2): 20 mg via INTRAVENOUS
  Filled 2013-10-23: qty 50

## 2013-10-23 MED ORDER — SODIUM CHLORIDE 0.9 % IV SOLN
INTRAVENOUS | Status: AC
  Administered 2013-10-23: 3.4 [IU]/h via INTRAVENOUS
  Filled 2013-10-23 (×2): qty 1

## 2013-10-23 MED ORDER — ACETAMINOPHEN 160 MG/5ML PO SOLN: 650.0000 mg | Freq: Once | ORAL | Status: AC

## 2013-10-23 MED ORDER — LACTATED RINGERS IV SOLN
INTRAVENOUS | Status: DC | PRN
  Administered 2013-10-23 (×2): via INTRAVENOUS

## 2013-10-23 MED ORDER — ASPIRIN EC 325 MG PO TBEC
325.0000 mg | DELAYED_RELEASE_TABLET | Freq: Every day | ORAL | Status: DC
  Administered 2013-10-24 – 2013-11-04 (×12): 325 mg via ORAL
  Filled 2013-10-23 (×13): qty 1

## 2013-10-23 MED ORDER — HEPARIN SODIUM (PORCINE) 1000 UNIT/ML IJ SOLN
INTRAMUSCULAR | Status: DC | PRN
  Administered 2013-10-23: 3000 [IU] via INTRAVENOUS
  Administered 2013-10-23: 42000 [IU] via INTRAVENOUS

## 2013-10-23 MED ORDER — EPHEDRINE SULFATE 50 MG/ML IJ SOLN
INTRAMUSCULAR | Status: AC
  Filled 2013-10-23: qty 1

## 2013-10-23 MED ORDER — PREDNISOLONE ACETATE 1 % OP SUSP
1.0000 [drp] | Freq: Every day | OPHTHALMIC | Status: DC
  Administered 2013-10-23 – 2013-11-05 (×14): 1 [drp] via OPHTHALMIC
  Filled 2013-10-23: qty 1

## 2013-10-23 MED ORDER — CEFUROXIME SODIUM 1.5 G IJ SOLR
1.5000 g | Freq: Two times a day (BID) | INTRAMUSCULAR | Status: AC
  Administered 2013-10-23 – 2013-10-25 (×4): 1.5 g via INTRAVENOUS
  Filled 2013-10-23 (×4): qty 1.5

## 2013-10-23 MED ORDER — HEPARIN SODIUM (PORCINE) 1000 UNIT/ML IJ SOLN
INTRAMUSCULAR | Status: AC
  Filled 2013-10-23: qty 2

## 2013-10-23 MED ORDER — PROTAMINE SULFATE 10 MG/ML IV SOLN
INTRAVENOUS | Status: AC
Start: 2013-10-23 — End: 2013-10-23
  Filled 2013-10-23: qty 5

## 2013-10-23 MED ORDER — MIDAZOLAM HCL 2 MG/2ML IJ SOLN: 2.0000 mg | INTRAMUSCULAR | Status: DC | PRN

## 2013-10-23 MED ORDER — HEMOSTATIC AGENTS (NO CHARGE) OPTIME
TOPICAL | Status: DC | PRN
  Administered 2013-10-23: 1 via TOPICAL

## 2013-10-23 MED ORDER — SODIUM CHLORIDE 0.9 % IJ SOLN
INTRAMUSCULAR | Status: AC
  Filled 2013-10-23: qty 10

## 2013-10-23 MED ORDER — MIDAZOLAM HCL 2 MG/2ML IJ SOLN
INTRAMUSCULAR | Status: AC
  Filled 2013-10-23: qty 2

## 2013-10-23 MED ORDER — MIDAZOLAM HCL 5 MG/5ML IJ SOLN
INTRAMUSCULAR | Status: DC | PRN
  Administered 2013-10-23: 3 mg via INTRAVENOUS
  Administered 2013-10-23: 1 mg via INTRAVENOUS
  Administered 2013-10-23: 2 mg via INTRAVENOUS
  Administered 2013-10-23: 1 mg via INTRAVENOUS
  Administered 2013-10-23: 2 mg via INTRAVENOUS

## 2013-10-23 MED ORDER — ACETAMINOPHEN 650 MG RE SUPP
650.0000 mg | Freq: Once | RECTAL | Status: AC
  Administered 2013-10-23: 650 mg via RECTAL

## 2013-10-23 MED ORDER — SODIUM CHLORIDE 0.9 % IV SOLN
250.0000 mL | INTRAVENOUS | Status: AC
  Administered 2013-10-23: 100 mL/h via INTRAVENOUS

## 2013-10-23 MED ORDER — ACETAMINOPHEN 160 MG/5ML PO SOLN
1000.0000 mg | Freq: Four times a day (QID) | ORAL | Status: DC
  Administered 2013-10-24: 1000 mg
  Filled 2013-10-23: qty 40.6

## 2013-10-23 MED ORDER — AMIODARONE HCL IN DEXTROSE 360-4.14 MG/200ML-% IV SOLN
INTRAVENOUS | Status: DC | PRN
  Administered 2013-10-23: 60 mg/h via INTRAVENOUS

## 2013-10-23 MED ORDER — ONDANSETRON HCL 4 MG/2ML IJ SOLN
4.0000 mg | Freq: Four times a day (QID) | INTRAMUSCULAR | Status: DC | PRN
  Administered 2013-10-24 – 2013-10-26 (×2): 4 mg via INTRAVENOUS
  Filled 2013-10-23 (×2): qty 2

## 2013-10-23 MED ORDER — PHENYLEPHRINE HCL 10 MG/ML IJ SOLN
0.0000 ug/min | INTRAVENOUS | Status: DC
  Administered 2013-10-23: 85 ug/min via INTRAVENOUS
  Administered 2013-10-23: 60 ug/min via INTRAVENOUS
  Administered 2013-10-24: 50 ug/min via INTRAVENOUS
  Administered 2013-10-24: 45.067 ug/min via INTRAVENOUS
  Administered 2013-10-24: 72 ug/min via INTRAVENOUS
  Administered 2013-10-24: 85 ug/min via INTRAVENOUS
  Administered 2013-10-25: 55 ug/min via INTRAVENOUS
  Filled 2013-10-23 (×8): qty 2

## 2013-10-23 MED ORDER — SODIUM CHLORIDE 0.9 % IV SOLN
INTRAVENOUS | Status: DC
  Administered 2013-10-24: 1 mL via INTRAVENOUS

## 2013-10-23 MED ORDER — BISACODYL 10 MG RE SUPP: 10.0000 mg | Freq: Every day | RECTAL | Status: DC

## 2013-10-23 MED ORDER — SODIUM CHLORIDE 0.9 % IJ SOLN: 3.0000 mL | INTRAMUSCULAR | Status: DC | PRN

## 2013-10-23 MED ORDER — MORPHINE SULFATE 2 MG/ML IJ SOLN
2.0000 mg | INTRAMUSCULAR | Status: DC | PRN
  Administered 2013-10-24: 2 mg via INTRAVENOUS
  Filled 2013-10-23: qty 1

## 2013-10-23 MED ORDER — ROCURONIUM BROMIDE 100 MG/10ML IV SOLN
INTRAVENOUS | Status: DC | PRN
  Administered 2013-10-23 (×2): 50 mg via INTRAVENOUS

## 2013-10-23 MED ORDER — DOCUSATE SODIUM 100 MG PO CAPS
200.0000 mg | ORAL_CAPSULE | Freq: Every day | ORAL | Status: DC
  Administered 2013-10-24 – 2013-11-05 (×10): 200 mg via ORAL
  Filled 2013-10-23 (×13): qty 2

## 2013-10-23 MED ORDER — AMIODARONE HCL IN DEXTROSE 360-4.14 MG/200ML-% IV SOLN
60.0000 mg/h | INTRAVENOUS | Status: DC
  Filled 2013-10-23: qty 200

## 2013-10-23 MED ORDER — LACTATED RINGERS IV SOLN: INTRAVENOUS | Status: DC

## 2013-10-23 MED ORDER — ACETAMINOPHEN 500 MG PO TABS
1000.0000 mg | ORAL_TABLET | Freq: Four times a day (QID) | ORAL | Status: AC
  Administered 2013-10-24 – 2013-10-28 (×18): 1000 mg via ORAL
  Filled 2013-10-23 (×23): qty 2

## 2013-10-23 MED ORDER — ASPIRIN 81 MG PO CHEW: 324.0000 mg | CHEWABLE_TABLET | Freq: Every day | ORAL | Status: DC

## 2013-10-23 SURGICAL SUPPLY — 141 items
ADAPTER CARDIO PERF ANTE/RETRO (ADAPTER) ×3 IMPLANT
ADPR PRFSN 84XANTGRD RTRGD (ADAPTER) ×2
APL SKNCLS STERI-STRIP NONHPOA (GAUZE/BANDAGES/DRESSINGS) ×2
APPLIER CLIP 9.375 MED OPEN (MISCELLANEOUS)
APPLIER CLIP 9.375 SM OPEN (CLIP)
APR CLP MED 9.3 20 MLT OPN (MISCELLANEOUS)
APR CLP SM 9.3 20 MLT OPN (CLIP)
ATRICLIP EXCLUSION 45 FLEX HDL (Clip) ×1 IMPLANT
ATTRACTOMAT 16X20 MAGNETIC DRP (DRAPES) ×6 IMPLANT
BAG DECANTER FOR FLEXI CONT (MISCELLANEOUS) ×6 IMPLANT
BANDAGE ELASTIC 4 VELCRO ST LF (GAUZE/BANDAGES/DRESSINGS) ×3 IMPLANT
BANDAGE ELASTIC 6 VELCRO ST LF (GAUZE/BANDAGES/DRESSINGS) ×3 IMPLANT
BANDAGE GAUZE ELAST BULKY 4 IN (GAUZE/BANDAGES/DRESSINGS) ×3 IMPLANT
BASKET HEART (ORDER IN 25'S) (MISCELLANEOUS) ×1
BASKET HEART (ORDER IN 25S) (MISCELLANEOUS) ×2 IMPLANT
BENZOIN TINCTURE PRP APPL 2/3 (GAUZE/BANDAGES/DRESSINGS) ×3 IMPLANT
BLADE STERNUM SYSTEM 6 (BLADE) ×6 IMPLANT
BLADE SURG 11 STRL SS (BLADE) ×3 IMPLANT
BLADE SURG ROTATE 9660 (MISCELLANEOUS) IMPLANT
CANISTER SUCTION 2500CC (MISCELLANEOUS) ×6 IMPLANT
CANN PRFSN 3/8X14X24FR PCFC (MISCELLANEOUS)
CANN PRFSN 3/8XCNCT ST RT ANG (MISCELLANEOUS)
CANNULA EZ GLIDE AORTIC 21FR (CANNULA) ×3 IMPLANT
CANNULA GUNDRY RCSP 15FR (MISCELLANEOUS) ×3 IMPLANT
CANNULA PRFSN 3/8X14X24FR PCFC (MISCELLANEOUS) IMPLANT
CANNULA PRFSN 3/8XCNCT RT ANG (MISCELLANEOUS) IMPLANT
CANNULA VEN MTL TIP RT (MISCELLANEOUS)
CANNULA VENOUS LOW PROF 34X46 (CANNULA) ×3 IMPLANT
CARDIAC SUCTION (MISCELLANEOUS) ×3 IMPLANT
CATH CPB KIT OWEN (MISCELLANEOUS) ×3 IMPLANT
CATH THORACIC 28FR (CATHETERS) IMPLANT
CATH THORACIC 28FR RT ANG (CATHETERS) IMPLANT
CATH THORACIC 36FR (CATHETERS) ×3 IMPLANT
CATH THORACIC 36FR RT ANG (CATHETERS) ×3 IMPLANT
CLAMP ISOLATOR SYNERGY LG (MISCELLANEOUS) ×3 IMPLANT
CLIP APPLIE 9.375 MED OPEN (MISCELLANEOUS) IMPLANT
CLIP APPLIE 9.375 SM OPEN (CLIP) IMPLANT
CLIP FOGARTY SPRING 6M (CLIP) IMPLANT
CLIP TI MEDIUM 24 (CLIP) IMPLANT
CLIP TI WIDE RED SMALL 24 (CLIP) IMPLANT
CONN 1/2X1/2X1/2  BEN (MISCELLANEOUS) ×1
CONN 1/2X1/2X1/2 BEN (MISCELLANEOUS) ×2 IMPLANT
CONN 3/8X1/2 ST GISH (MISCELLANEOUS) ×6 IMPLANT
CONN Y 3/8X3/8X3/8  BEN (MISCELLANEOUS)
CONN Y 3/8X3/8X3/8 BEN (MISCELLANEOUS) IMPLANT
COVER SURGICAL LIGHT HANDLE (MISCELLANEOUS) ×6 IMPLANT
CRADLE DONUT ADULT HEAD (MISCELLANEOUS) ×6 IMPLANT
DRAIN CHANNEL 32F RND 10.7 FF (WOUND CARE) ×6 IMPLANT
DRAPE CARDIOVASCULAR INCISE (DRAPES) ×3
DRAPE SLUSH/WARMER DISC (DRAPES) ×6 IMPLANT
DRAPE SRG 135X102X78XABS (DRAPES) ×2 IMPLANT
DRSG AQUACEL AG ADV 3.5X14 (GAUZE/BANDAGES/DRESSINGS) ×1 IMPLANT
DRSG COVADERM 4X14 (GAUZE/BANDAGES/DRESSINGS) ×6 IMPLANT
ELECT REM PT RETURN 9FT ADLT (ELECTROSURGICAL) ×12
ELECTRODE REM PT RTRN 9FT ADLT (ELECTROSURGICAL) ×8 IMPLANT
GLOVE BIO SURGEON STRL SZ 6 (GLOVE) ×4 IMPLANT
GLOVE BIO SURGEON STRL SZ 6.5 (GLOVE) ×8 IMPLANT
GLOVE BIO SURGEON STRL SZ7 (GLOVE) IMPLANT
GLOVE BIO SURGEON STRL SZ7.5 (GLOVE) IMPLANT
GLOVE BIOGEL PI IND STRL 6 (GLOVE) IMPLANT
GLOVE BIOGEL PI IND STRL 6.5 (GLOVE) IMPLANT
GLOVE BIOGEL PI IND STRL 7.0 (GLOVE) IMPLANT
GLOVE BIOGEL PI INDICATOR 6 (GLOVE)
GLOVE BIOGEL PI INDICATOR 6.5 (GLOVE)
GLOVE BIOGEL PI INDICATOR 7.0 (GLOVE) ×4
GLOVE EUDERMIC 7 POWDERFREE (GLOVE) IMPLANT
GLOVE ORTHO TXT STRL SZ7.5 (GLOVE) ×6 IMPLANT
GOWN STRL REUS W/ TWL LRG LVL3 (GOWN DISPOSABLE) ×16 IMPLANT
GOWN STRL REUS W/TWL LRG LVL3 (GOWN DISPOSABLE) ×54
HEMOSTAT POWDER SURGIFOAM 1G (HEMOSTASIS) ×18 IMPLANT
INSERT FOGARTY 61MM (MISCELLANEOUS) IMPLANT
INSERT FOGARTY XLG (MISCELLANEOUS) ×6 IMPLANT
KIT BASIN OR (CUSTOM PROCEDURE TRAY) ×6 IMPLANT
KIT ROOM TURNOVER OR (KITS) ×6 IMPLANT
KIT SUCTION CATH 14FR (SUCTIONS) ×24 IMPLANT
KIT VASOVIEW W/TROCAR VH 2000 (KITS) ×3 IMPLANT
LEAD PACING MYOCARDI (MISCELLANEOUS) ×3 IMPLANT
LOOP VESSEL SUPERMAXI WHITE (MISCELLANEOUS) ×3 IMPLANT
MARKER GRAFT CORONARY BYPASS (MISCELLANEOUS) ×9 IMPLANT
NS IRRIG 1000ML POUR BTL (IV SOLUTION) ×30 IMPLANT
PACK OPEN HEART (CUSTOM PROCEDURE TRAY) ×6 IMPLANT
PAD ARMBOARD 7.5X6 YLW CONV (MISCELLANEOUS) ×9 IMPLANT
PAD ELECT DEFIB RADIOL ZOLL (MISCELLANEOUS) ×3 IMPLANT
PENCIL BUTTON HOLSTER BLD 10FT (ELECTRODE) ×3 IMPLANT
PROBE CRYO2-ABLATION MALLABLE (MISCELLANEOUS) IMPLANT
PUNCH AORTIC ROTATE 4.0MM (MISCELLANEOUS) IMPLANT
PUNCH AORTIC ROTATE 4.5MM 8IN (MISCELLANEOUS) IMPLANT
PUNCH AORTIC ROTATE 5MM 8IN (MISCELLANEOUS) IMPLANT
SET CARDIOPLEGIA MPS 5001102 (MISCELLANEOUS) ×1 IMPLANT
SET IRRIG TUBING LAPAROSCOPIC (IRRIGATION / IRRIGATOR) ×3 IMPLANT
SOLUTION ANTI FOG 6CC (MISCELLANEOUS) IMPLANT
SPONGE GAUZE 4X4 12PLY (GAUZE/BANDAGES/DRESSINGS) ×10 IMPLANT
SPONGE LAP 18X18 X RAY DECT (DISPOSABLE) ×1 IMPLANT
SPONGE LAP 4X18 X RAY DECT (DISPOSABLE) ×1 IMPLANT
SUCKER INTRACARDIAC WEIGHTED (SUCKER) ×3 IMPLANT
SUT BONE WAX W31G (SUTURE) ×6 IMPLANT
SUT ETHIBOND 2 0 SH (SUTURE) ×6
SUT ETHIBOND 2 0 SH 36X2 (SUTURE) ×4 IMPLANT
SUT ETHIBOND X763 2 0 SH 1 (SUTURE) ×14 IMPLANT
SUT MNCRL AB 3-0 PS2 18 (SUTURE) ×12 IMPLANT
SUT MNCRL AB 4-0 PS2 18 (SUTURE) IMPLANT
SUT PDS AB 1 CTX 36 (SUTURE) ×12 IMPLANT
SUT PROLENE 2 0 SH DA (SUTURE) IMPLANT
SUT PROLENE 3 0 SH 1 (SUTURE) ×3 IMPLANT
SUT PROLENE 3 0 SH DA (SUTURE) ×4 IMPLANT
SUT PROLENE 3 0 SH1 36 (SUTURE) IMPLANT
SUT PROLENE 4 0 RB 1 (SUTURE) ×6
SUT PROLENE 4 0 SH DA (SUTURE) ×6 IMPLANT
SUT PROLENE 4-0 RB1 .5 CRCL 36 (SUTURE) ×4 IMPLANT
SUT PROLENE 5 0 C 1 36 (SUTURE) IMPLANT
SUT PROLENE 6 0 C 1 30 (SUTURE) IMPLANT
SUT PROLENE 7 0 BV1 MDA (SUTURE) ×2 IMPLANT
SUT PROLENE 7.0 RB 3 (SUTURE) ×12 IMPLANT
SUT PROLENE 8 0 BV175 6 (SUTURE) IMPLANT
SUT PROLENE BLUE 7 0 (SUTURE) ×3 IMPLANT
SUT PROLENE POLY MONO (SUTURE) ×1 IMPLANT
SUT SILK  1 MH (SUTURE) ×2
SUT SILK 1 MH (SUTURE) ×4 IMPLANT
SUT STEEL 6MS V (SUTURE) IMPLANT
SUT STEEL STERNAL CCS#1 18IN (SUTURE) ×1 IMPLANT
SUT STEEL SZ 6 DBL 3X14 BALL (SUTURE) ×2 IMPLANT
SUT VIC AB 1 CTX 18 (SUTURE) ×1 IMPLANT
SUT VIC AB 1 CTX 36 (SUTURE)
SUT VIC AB 1 CTX36XBRD ANBCTR (SUTURE) IMPLANT
SUT VIC AB 2-0 CT1 27 (SUTURE)
SUT VIC AB 2-0 CT1 TAPERPNT 27 (SUTURE) IMPLANT
SUT VIC AB 2-0 CTX 27 (SUTURE) IMPLANT
SUT VIC AB 3-0 SH 27 (SUTURE)
SUT VIC AB 3-0 SH 27X BRD (SUTURE) IMPLANT
SUT VIC AB 3-0 X1 27 (SUTURE) IMPLANT
SUT VICRYL 4-0 PS2 18IN ABS (SUTURE) IMPLANT
SUTURE E-PAK OPEN HEART (SUTURE) ×3 IMPLANT
SYS ATRICLIP LAA EXCLUSION 45 (CLIP) IMPLANT
SYSTEM SAHARA CHEST DRAIN ATS (WOUND CARE) ×6 IMPLANT
TAPE CLOTH SURG 4X10 WHT LF (GAUZE/BANDAGES/DRESSINGS) ×1 IMPLANT
TOWEL OR 17X24 6PK STRL BLUE (TOWEL DISPOSABLE) ×12 IMPLANT
TOWEL OR 17X26 10 PK STRL BLUE (TOWEL DISPOSABLE) ×12 IMPLANT
TRAY FOLEY IC TEMP SENS 14FR (CATHETERS) ×6 IMPLANT
TUBING INSUFFLATION 10FT LAP (TUBING) ×3 IMPLANT
UNDERPAD 30X30 INCONTINENT (UNDERPADS AND DIAPERS) ×6 IMPLANT
WATER STERILE IRR 1000ML POUR (IV SOLUTION) ×12 IMPLANT

## 2013-10-23 NOTE — Progress Notes (Signed)
UR Completed.  Jailin Moomaw Jane [phone] 10/23/2013  

## 2013-10-23 NOTE — Progress Notes (Signed)
Patient ID: Elaine [name], female   DOB: 07-06-1941, 73 y.o.   MRN: [id number] EVENING ROUNDS NOTE :     [address]       [place] [address]             [phone]                 Day of Surgery Procedure(s) (LRB): CORONARY ARTERY BYPASS GRAFTING (CABG) (N/A) INTRAOPERATIVE TRANSESOPHAGEAL ECHOCARDIOGRAM (N/A) CLIPPING OF ATRIAL APPENDAGE (N/A)  Total Length of Stay:  LOS: 3 days  BP 102/51  Pulse 80  Temp(Src) 97.3 F (36.3 C) (Core (Comment))  Resp 12  Ht 5' 1.81" (1.57 m)  Wt 249 lb 9 oz (113.2 kg)  BMI 45.92 kg/m2  SpO2 100%  .Intake/Output     02/25 0701 - 02/26 0700 02/26 0701 - 02/27 0700   P.O. 840    I.V. (mL/kg) 495 (4.4) 3384.3 (29.9)   Blood  1027   NG/GT  30   IV Piggyback  1660   Total Intake(mL/kg) 1335 (11.8) 6101.3 (53.9)   Urine (mL/kg/hr) 985 (0.4) 755 (0.6)   Stool 1 (0)    Blood  925 (0.8)   Chest Tube  210 (0.2)   Total Output 986 1890   Net +349 +4211.3          . sodium chloride 20 mL/hr at 10/23/13 1700  . sodium chloride    . sodium chloride 250 mL (10/23/13 1700)  . amiodarone (NEXTERONE PREMIX) 360 mg/200 mL dextrose 60 mg/hr (10/23/13 1700)  . dexmedetomidine 0.5 mcg/kg/hr (10/23/13 1700)  . DOPamine 3 mcg/kg/min (10/23/13 1700)  . insulin (NOVOLIN-R) infusion 5.4 Units/hr (10/23/13 1700)  . lactated ringers    . milrinone 0.3 mcg/kg/min (10/23/13 1712)  . nitroGLYCERIN Stopped (10/23/13 1330)  . phenylephrine (NEO-SYNEPHRINE) Adult infusion 60 mcg/min (10/23/13 1721)     Lab Results  Component Value Date   WBC 15.1* 10/23/2013   HGB 10.7* 10/23/2013   HGB 11.6* 10/23/2013   HCT 30.9* 10/23/2013   HCT 34.0* 10/23/2013   PLT 150 10/23/2013   GLUCOSE 165* 10/23/2013   CHOL 147 10/21/2013   TRIG 69 10/21/2013   HDL 83 10/21/2013   LDLCALC 50 10/21/2013   ALT 45* 10/21/2013   AST 216* 10/21/2013   NA 137 10/23/2013   K 4.1 10/23/2013   CL 99 10/23/2013   CREATININE 1.96* 10/23/2013   BUN 38* 10/23/2013   CO2 28  10/23/2013   TSH 2.351 10/20/2013   INR 1.27 10/23/2013   HGBA1C 5.7* 10/20/2013   Early post op, a fib cardioverted, on Cordarone drip sinus, not bleeding  Grace Isaac MD  Beeper [phone] Office [phone] 10/23/2013 5:48 PM

## 2013-10-23 NOTE — CV Procedure (Signed)
Intra-operative Transesophageal Echocardiography Note:  Elaine [name] is a 73 year old female with multiple cardiac risk factors but no previous cardiac history who presented with a six-week history of progressive substernal chest pressure and shortness of breath. She developed acute worsening of her symptoms 3 days ago and was admitted to the hospital with pulmonary edema and new onset atrial fibrillation. Cardiac catheterization revealed left main stenosis, high-grade proximal LAD stenosis, and severe three-vessel coronary disease. She is now scheduled to undergo coronary artery bypass grafting by Dr. Roxy Manns. Intra-operative transesophageal echocardiography was requested to evaluate the right and left ventricular function, to serve as a monitor for intraoperative volume status, and to assess for any valvular pathology.  The patient was brought to the operating room at [facility] and general anesthesia was induced without difficulty. Following endotracheal intubation and orogastric suctioning, the transesophageal echocardiography probe was inserted into the esophagus without difficulty.  Impression: Pre-bypass findings:  1. Aortic valve: There was moderate aortic valve sclerosis. There were small filamentous attachments to the aortic valve noted which appeared clinically insignificant. The valve leaflets opened normally and there was no aortic insufficiency.  2. Mitral valve: There was moderate mitral annular calcification and some calcification of the anterior lateral papillary muscle noted. The leaflets coapted well without prolapsing or flail segments. There was trace mitral insufficiency.  3. Left ventricle: There was moderate to severe left ventricular dysfunction. The ejection fraction was estimated at 25-30%. There appeared to be adequate contractility in the basilar and mid regions of the anterior, lateral, inferior, and septal walls. However  the distal anterior wall, apex,  anterior  septum, and distal inferior walls were akinetic. There were no thinned segments appreciated. There was no thrombus noted in the left ventricular apex. There was mild concentric left ventricular hypertrophy. The left ventricular wall thickness measured 10.5-11 6 mm at the mid-papillary level in the transgastric short axis view. Left ventricular cavity was at the upper limits of normal in size and measured 5.4 cm at end diastole at the mid-papillary level in the transgastric short axis view.  4. Right ventricle: There appeared to be normal right ventricular function. The right ventricular size was  normal and there was good contractility the right ventricular free wall is in the basilar and apical areas.  5. Tricuspid valve: Tricuspid valve appeared structurally normal and there was 1+ tricuspid insufficiency.  6. Interatrial septum: There was lipomatous hypertrophy of the interatrial septum. However there was no evidence of patent foramen ovale or atrial septal defect by color Doppler or bubble study.  7. Left atrium: Left atrial cavity showed no evidence of thrombus within the cavity or left atrial appendage  8. Ascending aorta: There was a well-defined aortic root and sinotubular ridge. There was moderate intimal thickening of the ascending aorta and scattered calcification noted. There was no aneurysmal enlargement of the descending aorta noted.  9. Descending aorta there was scattered grade 2-3 atheromatous plaques noted within the descending aorta. The descending order was of normal diameter measured 2.5 cm.  Post-bypass findings:  1. Aortic valve: The aortic valve appeared unchanged from the pre-bypass study and there was moderate aortic valve sclerosis but normal leaflet opening and no aortic insufficiency.  2. Mitral valve: The mitral valve appeared unchanged from the pre-bypass study. The leaflets coapted well and there was trace mitral insufficiency.  3. Left ventricle: The LV again  appeared to be at the upper limits of normal. There appeared to be some improved contractility in the basilar and mid  regions of the left ventricle. However the distal anterior wall, apex, anterior septum, and distal inferior walls were akinetic and appeared unchanged from the pre-bypass study. The ejection fraction was estimated at 25-30%  4 Right ventricle: The right ventricular cavity appeared normal in size. There was normal contractility the right ventricular free wall which appeared unchanged from the pre-bypass study.  5. Tricuspid valve: The tricuspid leaflets co-apted well and there was 1+ tricuspid insufficiency.  Roberts Gaudy, M.D.

## 2013-10-23 NOTE — Op Note (Signed)
CARDIOTHORACIC SURGERY OPERATIVE NOTE  Date of Procedure: 10/23/2013  Preoperative Diagnosis:   Left Main Disease  Severe 3-vessel Coronary Artery Disease  S/P Acute Myocardial Infarction  Postoperative Diagnosis: Same  Procedure:    Coronary Artery Bypass Grafting x 3   Left Internal Mammary Artery to Distal Left Anterior Descending Coronary Artery  Saphenous Vein Graft to Right Posterolateral BranchCoronary Artery  Saphenous Vein Graft to Second Obtuse Marginal Branch of Left Circumflex Coronary Artery  Endoscopic Vein Harvest from Right Thigh   Clipping of Left Atrial Appendage  Atricure Left Atrial Clip, size 45 mm   Surgeon: Valentina Gu. Roxy Manns, MD  Assistant: Nani Skillern, PA-C  Anesthesia: Roberts Gaudy, MD  Operative Findings: Ischemic cardiomyopathy with recent transmural acute myocardial infarction of anterior wall and septum, ejection fraction estimated 25%  Diffuse coronary artery disease  Good quality left internal mammary conduit for grafting  Good quality saphenous vein conduit for grafting  Fair quality target vessels for grafting     BRIEF CLINICAL NOTE AND INDICATIONS FOR SURGERY  Patient is a 73 year old morbidly obese African American female from [place] with no previous cardiac history but multiple risk factors including history of long-standing hypertension, type 2 diabetes mellitus with complication, hyperlipidemia, and remote tobacco abuse who reports a six-week history of progressive substernal chest pressure and shortness of breath. These symptoms have for the most part been worse following meals and at night while the patient has been trying to sleep, and at times the patient felt that belching would alleviate the substernal chest pressure. Symptoms were initially attributed to GE reflux and the patient has been treated with antiacids therapies with minimal effectiveness. This past weekend symptoms became acutely worse and progressive  until early 10/20/2013 the patient awoke with severe resting shortness of breath and orthopnea. EMS was called and baseline electrocardiogram revealed atrial fibrillation with left bundle branch block. There were some ST segment abnormalities felt concerning for possible STEMI, and a code STEMI was called. The patient was taken directly to the cardiac Cath Lab by Dr. Ellyn Hack where catheterization demonstrates moderate left main coronary artery stenosis with high grade proximal stenosis of the left anterior descending coronary artery and severe three-vessel coronary artery disease. Left ventricular function was not assessed initially at cath but subsequent transthoracic echocardiogram was performed demonstrating severe global left ventricular dysfunction with severe anterior and apical hypokinesis or akinesis. The patient's symptoms of chest discomfort and shortness of breath had been dramatically alleviated with intravenous heparin and nitroglycerin. Cardiac thoracic surgical consultation was requested.  The patient has been seen in consultation and counseled at length regarding the indications, risks and potential benefits of surgery.  All questions have been answered, and the patient provides full informed consent for the operation as described.     DETAILS OF THE OPERATIVE PROCEDURE  Preparation:  The patient is brought to the operating room on the above mentioned date and central monitoring was established by the anesthesia team including placement of Swan-Ganz catheter and radial arterial line. The patient is placed in the supine position on the operating table.  Intravenous antibiotics are administered. General endotracheal anesthesia is induced uneventfully. A Foley catheter is placed.  Baseline transesophageal echocardiogram was performed.  Findings were notable for severe left ventricular dysfunction with akinesis of the mid and distal anterior wall, septum, and apex. Ejection fraction was  estimated 25%. Right ventricular size and systolic function was normal. The base of the heart, the lateral wall, and the inferior wall were all moving  normally.  The patient's chest, abdomen, both groins, and both lower extremities are prepared and draped in a sterile manner. A time out procedure is performed.   Surgical Approach and Conduit Harvest:  A median sternotomy incision was performed and the left internal mammary artery is dissected from the chest wall and prepared for bypass grafting. The left internal mammary artery is notably good quality conduit. Simultaneously, saphenous vein is obtained from the patient's right thigh using endoscopic vein harvest technique. The saphenous vein is notably good quality conduit. After removal of the saphenous vein, the small surgical incisions in the lower extremity are closed with absorbable suture. Following systemic heparinization, the left internal mammary artery was transected distally noted to have excellent flow.   Extracorporeal Cardiopulmonary Bypass and Myocardial Protection:  The pericardium is opened. The ascending aorta is normal in appearance. The ascending aorta and the right atrium are cannulated for cardioplegia bypass.  Adequate heparinization is verified.    A retrograde cardioplegia cannula is placed through the right atrium into the coronary sinus.  The entire pre-bypass portion of the operation was notable for stable hemodynamics.  Cardiopulmonary bypass was begun and the surface of the heart is inspected. Distal target vessels are selected for coronary artery bypass grafting. A cardioplegia cannula is placed in the ascending aorta.  A temperature probe was placed in the interventricular septum.  The patient is allowed to cool passively to [facility] systemic temperature.  The aortic cross clamp is applied and cold blood cardioplegia is delivered initially in an antegrade fashion through the aortic root.   Supplemental cardioplegia is  given retrograde through the coronary sinus catheter.  Iced saline slush is applied for topical hypothermia.  The initial cardioplegic arrest is rapid with early diastolic arrest.  Repeat doses of cardioplegia are administered intermittently throughout the entire cross clamp portion of the operation through the aortic root,  through the coronary sinus catheter, and through subsequently placed vein grafts in order to maintain completely flat electrocardiogram and septal myocardial temperature below 15C.  Myocardial protection was felt to be excellent.   Clipping of Left Atrial Appendage:  An Atricure left atrial clip (size 45 mm) was applied to the left atrial appendage.   Coronary Artery Bypass Grafting:   The posterolateral branch of the right coronary artery was grafted using a reversed saphenous vein graft in an end-to-side fashion.  At the site of distal anastomosis the target vessel was fair quality and measured approximately 1.5 mm in diameter.  The second obtuse marginal branch of the left circumflex coronary artery was grafted using a reversed saphenous vein graft in an end-to-side fashion.  At the site of distal anastomosis the target vessel was fair quality and measured approximately 2.0 mm in diameter.  The distal left anterior coronary artery was grafted with the left internal mammary artery in an end-to-side fashion.  At the site of distal anastomosis the target vessel was diffusely calcified, fair quality and measured approximately 1.8 mm in diameter.  All proximal vein graft anastomoses were placed directly to the ascending aorta prior to removal of the aortic cross clamp.  The septal myocardial temperature rose rapidly after reperfusion of the left internal mammary artery graft.  The aortic cross clamp was removed after a total cross clamp time of 70 minutes.   Procedure Completion:  All proximal and distal coronary anastomoses were inspected for hemostasis and appropriate  graft orientation. Epicardial pacing wires are fixed to the right ventricular outflow tract and to the right atrial  appendage. The patient is rewarmed to 37C temperature. The patient is weaned from cardiopulmonary bypass.  Initially there were signs of a small amount of air within the left ventricular chamber. Cardioplegia bypass was continued for a period of time and air was evacuated through the left ventricular apex with an 18-gauge Angiocath. The patient was subsequently weaned and separated from bypass without difficulty.  The patient's rhythm at separation from bypass was AV paced.  The patient was weaned from cardioplegic bypass on low dose milrinone and dopamine infusions. Total cardiopulmonary bypass time for the operation was 101 minutes.  Followup transesophageal echocardiogram performed after separation from bypass revealed no changes from the preoperative exam.  The aortic and venous cannula were removed uneventfully. Protamine was administered to reverse the anticoagulation. The mediastinum and pleural space were inspected for hemostasis and irrigated with saline solution. The mediastinum and the left pleural space were drained using 3 chest tubes placed through separate stab incisions inferiorly.  The soft tissues anterior to the aorta were reapproximated loosely. The sternum is closed with double strength sternal wire. The soft tissues anterior to the sternum were closed in multiple layers and the skin is closed with a running subcuticular skin closure.  The post-bypass portion of the operation was notable for stable rhythm and hemodynamics.  The patient received 2 units packed red blood cells during the procedure due to anemia which was present preoperatively and exacerbated by acute blood loss and hemodilution during cardiopulmonary bypass.    Disposition:  The patient tolerated the procedure well and is transported to the surgical intensive care in stable condition. There are no  intraoperative complications. All sponge instrument and needle counts are verified correct at completion of the operation.    Valentina Gu. Roxy Manns MD 10/23/2013 12:54 PM

## 2013-10-23 NOTE — Anesthesia Preprocedure Evaluation (Signed)
Anesthesia Evaluation  Patient identified by MRN, date of birth, ID band Patient awake    Reviewed: Allergy & Precautions, H&P , NPO status , Patient's Chart, lab work & pertinent test results  Airway Mallampati: II TM Distance: >3 FB     Dental  (+) Edentulous Upper, Poor Dentition   Pulmonary former smoker,    + decreased breath sounds      Cardiovascular hypertension, Rhythm:Regular Rate:Normal     Neuro/Psych    GI/Hepatic   Endo/Other  diabetes  Renal/GU      Musculoskeletal   Abdominal   Peds  Hematology   Anesthesia Other Findings   Reproductive/Obstetrics                           Anesthesia Physical Anesthesia Plan  ASA: III  Anesthesia Plan: General   Post-op Pain Management:    Induction: Intravenous  Airway Management Planned: Oral ETT  Additional Equipment: Arterial line, PA Cath, 3D TEE, CVP and Ultrasound Guidance Line Placement  Intra-op Plan:   Post-operative Plan:   Informed Consent: I have reviewed the patients History and Physical, chart, labs and discussed the procedure including the risks, benefits and alternatives for the proposed anesthesia with the patient or authorized representative who has indicated his/her understanding and acceptance.     Plan Discussed with: CRNA and Anesthesiologist  Anesthesia Plan Comments:         Anesthesia Quick Evaluation

## 2013-10-23 NOTE — Progress Notes (Signed)
[facility] BRIEF SICU PROGRESS NOTE  Day of Surgery  S/P Procedure(s) (LRB): CORONARY ARTERY BYPASS GRAFTING (CABG) (N/A) INTRAOPERATIVE TRANSESOPHAGEAL ECHOCARDIOGRAM (N/A) CLIPPING OF ATRIAL APPENDAGE (N/A)   Upon arrival in SICU patient developed rapid atrial fibrillation associated with hypotension Amiodarone bolus and IV metoprolol administered along with albumin bolus, but BP remained low despite improved rate control Patient cardioverted to AV paced rhythm using single countershock 200J BP improved after cardioversion  Plan: Continue IV amiodarone  Elaine [name] H 10/23/2013 1:39 PM

## 2013-10-23 NOTE — Brief Op Note (Addendum)
10/20/2013 - 10/23/2013  11:22 AM  PATIENT:  Elaine [name]  73 y.o. female  PRE-OPERATIVE DIAGNOSIS:  1.Coronary Artery Disease 2.AFIB  POST-OPERATIVE DIAGNOSIS:  1.Coronary Artery Disease 2.AFib  PROCEDURE:INTRAOPERATIVE TRANSESOPHAGEAL ECHOCARDIOGRAM, CORONARY ARTERY BYPASS GRAFTING (CABG) x 3 (LIMA to LAD, SVG to OM, and SVG to PLB) with EVH from right thight  CLIPPING OF ATRIAL APPENDAGE   SURGEON:    Rexene Alberts, MD  ASSISTANTS:  Nani Skillern, PA-C  ANESTHESIA:   Roberts Gaudy, MD  CROSSCLAMP TIME:   37'  CARDIOPULMONARY BYPASS TIME: 101'  FINDINGS:  Ischemic cardiomyopathy with recent transmural acute myocardial infarction of anterior wall and septum, ejection fraction estimated 25%  Diffuse coronary artery disease  Good quality left internal mammary conduit for grafting  Good quality saphenous vein conduit for grafting  Fair quality target vessels for grafting  COMPLICATIONS: None  BASELINE WEIGHT: 113 kg  PATIENT DISPOSITION:   TO SICU IN STABLE CONDITION  Jeromiah Ohalloran H 10/23/2013 12:47 PM

## 2013-10-23 NOTE — Progress Notes (Signed)
  Echocardiogram Echocardiogram Transesophageal (OR) has been performed.  Basilia Jumbo 10/23/2013, 9:23 AM

## 2013-10-23 NOTE — Transfer of Care (Signed)
Immediate Anesthesia Transfer of Care Note  Patient: Elaine [name]  Procedure(s) Performed: Procedure(s): CORONARY ARTERY BYPASS GRAFTING (CABG) (N/A) INTRAOPERATIVE TRANSESOPHAGEAL ECHOCARDIOGRAM (N/A) CLIPPING OF ATRIAL APPENDAGE (N/A)  Patient Location: SICU  Anesthesia Type:General  Level of Consciousness: sedated, unresponsive and Patient remains intubated per anesthesia plan  Airway & Oxygen Therapy: Patient remains intubated per anesthesia plan and Patient placed on Ventilator (see vital sign flow sheet for setting)  Post-op Assessment: Report given to PACU RN and Post -op Vital signs reviewed and unstable, Anesthesiologist notified  Post vital signs: Reviewed and unstable  Complications: No apparent anesthesia complications

## 2013-10-24 ENCOUNTER — Inpatient Hospital Stay (HOSPITAL_COMMUNITY): Payer: Medicare HMO

## 2013-10-24 ENCOUNTER — Encounter (HOSPITAL_COMMUNITY): Payer: Self-pay | Admitting: Thoracic Surgery (Cardiothoracic Vascular Surgery)

## 2013-10-24 LAB — GLUCOSE, CAPILLARY
GLUCOSE-CAPILLARY: 112 mg/dL — AB (ref 70–99)
GLUCOSE-CAPILLARY: 116 mg/dL — AB (ref 70–99)
GLUCOSE-CAPILLARY: 129 mg/dL — AB (ref 70–99)
GLUCOSE-CAPILLARY: 121 mg/dL — AB (ref 70–99)
GLUCOSE-CAPILLARY: 130 mg/dL — AB (ref 70–99)
GLUCOSE-CAPILLARY: 116 mg/dL — AB (ref 70–99)
GLUCOSE-CAPILLARY: 112 mg/dL — AB (ref 70–99)
GLUCOSE-CAPILLARY: 93 mg/dL (ref 70–99)
GLUCOSE-CAPILLARY: 103 mg/dL — AB (ref 70–99)
GLUCOSE-CAPILLARY: 128 mg/dL — AB (ref 70–99)
GLUCOSE-CAPILLARY: 140 mg/dL — AB (ref 70–99)
GLUCOSE-CAPILLARY: 165 mg/dL — AB (ref 70–99)
Glucose-Capillary: 106 mg/dL — ABNORMAL HIGH (ref 70–99)
Glucose-Capillary: 109 mg/dL — ABNORMAL HIGH (ref 70–99)
Glucose-Capillary: 113 mg/dL — ABNORMAL HIGH (ref 70–99)
Glucose-Capillary: 115 mg/dL — ABNORMAL HIGH (ref 70–99)
Glucose-Capillary: 120 mg/dL — ABNORMAL HIGH (ref 70–99)
Glucose-Capillary: 123 mg/dL — ABNORMAL HIGH (ref 70–99)
Glucose-Capillary: 125 mg/dL — ABNORMAL HIGH (ref 70–99)
Glucose-Capillary: 125 mg/dL — ABNORMAL HIGH (ref 70–99)
Glucose-Capillary: 126 mg/dL — ABNORMAL HIGH (ref 70–99)
Glucose-Capillary: 131 mg/dL — ABNORMAL HIGH (ref 70–99)
Glucose-Capillary: 77 mg/dL (ref 70–99)

## 2013-10-24 LAB — CREATININE, SERUM
Creatinine, Ser: 1.69 mg/dL — ABNORMAL HIGH (ref 0.50–1.10)
GFR calc Af Amer: 34 mL/min — ABNORMAL LOW (ref >90)
GFR calc non Af Amer: 29 mL/min — ABNORMAL LOW (ref >90)

## 2013-10-24 LAB — POCT I-STAT 3, ART BLOOD GAS (G3+)
ACID-BASE DEFICIT: 2 mmol/L (ref 0.0–2.0)
Acid-base deficit: 2 mmol/L (ref 0.0–2.0)
Bicarbonate: 23.2 mEq/L (ref 20.0–24.0)
Bicarbonate: 23.7 mEq/L (ref 20.0–24.0)
O2 SAT: 97 %
O2 SAT: 94 %
PO2 ART: 92 mmHg (ref 80.0–100.0)
Patient temperature: 36.7
Patient temperature: 36.7
TCO2: 24 mmol/L (ref 0–100)
TCO2: 25 mmol/L (ref 0–100)
pCO2 arterial: 39.3 mmHg (ref 35.0–45.0)
pCO2 arterial: 42.7 mmHg (ref 35.0–45.0)
pH, Arterial: 7.378 (ref 7.350–7.450)
pH, Arterial: 7.351 (ref 7.350–7.450)
pO2, Arterial: 74 mmHg — ABNORMAL LOW (ref 80.0–100.0)

## 2013-10-24 LAB — POCT I-STAT, CHEM 8
BUN: 28 mg/dL — ABNORMAL HIGH (ref 6–23)
CALCIUM ION: 1.22 mmol/L (ref 1.13–1.30)
Chloride: 101 mEq/L (ref 96–112)
Creatinine, Ser: 1.9 mg/dL — ABNORMAL HIGH (ref 0.50–1.10)
Glucose, Bld: 162 mg/dL — ABNORMAL HIGH (ref 70–99)
HCT: 30 % — ABNORMAL LOW (ref 36.0–46.0)
HEMOGLOBIN: 10.2 g/dL — AB (ref 12.0–15.0)
Potassium: 4.1 mEq/L (ref 3.7–5.3)
Sodium: 134 mEq/L — ABNORMAL LOW (ref 137–147)
TCO2: 21 mmol/L (ref 0–100)

## 2013-10-24 LAB — BASIC METABOLIC PANEL
BUN: 28 mg/dL — ABNORMAL HIGH (ref 6–23)
CALCIUM: 8.7 mg/dL (ref 8.4–10.5)
CHLORIDE: 102 meq/L (ref 96–112)
CO2: 21 mEq/L (ref 19–32)
CREATININE: 1.6 mg/dL — AB (ref 0.50–1.10)
GFR calc non Af Amer: 31 mL/min — ABNORMAL LOW (ref >90)
GFR, EST AFRICAN AMERICAN: 36 mL/min — AB (ref >90)
Glucose, Bld: 127 mg/dL — ABNORMAL HIGH (ref 70–99)
Potassium: 4.2 mEq/L (ref 3.7–5.3)
Sodium: 137 mEq/L (ref 137–147)

## 2013-10-24 LAB — CBC
HCT: 26.9 % — ABNORMAL LOW (ref 36.0–46.0)
HEMATOCRIT: 27.2 % — AB (ref 36.0–46.0)
HEMOGLOBIN: 9.6 g/dL — AB (ref 12.0–15.0)
Hemoglobin: 9.3 g/dL — ABNORMAL LOW (ref 12.0–15.0)
MCH: 28.2 pg (ref 26.0–34.0)
MCH: 28.9 pg (ref 26.0–34.0)
MCHC: 34.6 g/dL (ref 30.0–36.0)
MCHC: 35.3 g/dL (ref 30.0–36.0)
MCV: 81.5 fL (ref 78.0–100.0)
MCV: 81.9 fL (ref 78.0–100.0)
PLATELETS: 167 10*3/uL (ref 150–400)
Platelets: 132 10*3/uL — ABNORMAL LOW (ref 150–400)
RBC: 3.3 MIL/uL — ABNORMAL LOW (ref 3.87–5.11)
RBC: 3.32 MIL/uL — ABNORMAL LOW (ref 3.87–5.11)
RDW: 15 % (ref 11.5–15.5)
RDW: 15.5 % (ref 11.5–15.5)
WBC: 11.3 10*3/uL — AB (ref 4.0–10.5)
WBC: 14 10*3/uL — ABNORMAL HIGH (ref 4.0–10.5)

## 2013-10-24 LAB — MAGNESIUM
MAGNESIUM: 2.7 mg/dL — AB (ref 1.5–2.5)
Magnesium: 2.6 mg/dL — ABNORMAL HIGH (ref 1.5–2.5)

## 2013-10-24 MED ORDER — MORPHINE SULFATE 2 MG/ML IJ SOLN
1.0000 mg | INTRAMUSCULAR | Status: DC | PRN
  Administered 2013-10-24 – 2013-10-26 (×2): 1 mg via INTRAVENOUS
  Filled 2013-10-24 (×2): qty 1

## 2013-10-24 MED ORDER — INSULIN DETEMIR 100 UNIT/ML ~~LOC~~ SOLN
20.0000 [IU] | Freq: Two times a day (BID) | SUBCUTANEOUS | Status: DC
  Administered 2013-10-24 – 2013-10-26 (×5): 20 [IU] via SUBCUTANEOUS
  Filled 2013-10-24 (×8): qty 0.2

## 2013-10-24 MED ORDER — ENOXAPARIN SODIUM 30 MG/0.3ML ~~LOC~~ SOLN
30.0000 mg | Freq: Every day | SUBCUTANEOUS | Status: DC
  Administered 2013-10-25 – 2013-11-02 (×9): 30 mg via SUBCUTANEOUS
  Filled 2013-10-24 (×11): qty 0.3

## 2013-10-24 MED ORDER — INSULIN ASPART 100 UNIT/ML ~~LOC~~ SOLN
0.0000 [IU] | SUBCUTANEOUS | Status: DC
  Administered 2013-10-24 (×2): 2 [IU] via SUBCUTANEOUS
  Administered 2013-10-24: 4 [IU] via SUBCUTANEOUS
  Administered 2013-10-24 – 2013-10-26 (×5): 2 [IU] via SUBCUTANEOUS

## 2013-10-24 MED ORDER — SODIUM CHLORIDE 0.9 % IV SOLN
INTRAVENOUS | Status: DC
  Administered 2013-10-25 – 2013-10-29 (×3): 20 mL/h via INTRAVENOUS
  Administered 2013-11-01: 17:00:00 via INTRAVENOUS

## 2013-10-24 MED FILL — Sodium Chloride IV Soln 0.9%: INTRAVENOUS | Qty: 2000 | Status: AC

## 2013-10-24 MED FILL — Lidocaine HCl IV Inj 20 MG/ML: INTRAVENOUS | Qty: 5 | Status: AC

## 2013-10-24 MED FILL — Electrolyte-R (PH 7.4) Solution: INTRAVENOUS | Qty: 1000 | Status: AC

## 2013-10-24 MED FILL — Mannitol IV Soln 20%: INTRAVENOUS | Qty: 500 | Status: AC

## 2013-10-24 MED FILL — Sodium Bicarbonate IV Soln 8.4%: INTRAVENOUS | Qty: 50 | Status: AC

## 2013-10-24 MED FILL — Heparin Sodium (Porcine) Inj 1000 Unit/ML: INTRAMUSCULAR | Qty: 30 | Status: AC

## 2013-10-24 NOTE — Progress Notes (Signed)
SUBJECTIVE:   Usual post up chest pain.  Pleasant   PHYSICAL EXAM Filed Vitals:   10/24/13 0945 10/24/13 1000 10/24/13 1015 10/24/13 1030  BP: 118/59 108/62 123/60   Pulse: 87 87 79 81  Temp: 97.9 F (36.6 C) 97.9 F (36.6 C) 98.1 F (36.7 C) 98.1 F (36.7 C)  TempSrc:      Resp: 23 25 23 20   Height:      Weight:      SpO2: 100% 100% 100% 96%   General:  No distress Lungs:   Decreased breath sounds Heart:  RRR, no rub Abdomen:  Positive bowel sounds, no rebound no guarding Extremities:  No edema   LABS:  Results for orders placed during the hospital encounter of 10/20/13 (from the past 24 hour(s))  POCT I-STAT 4, (NA,K, GLUC, HGB,HCT)     Status: Abnormal   Collection Time    10/23/13 12:09 PM      Result Value Ref Range   Sodium 136 (*) 137 - 147 mEq/L   Potassium 4.5  3.7 - 5.3 mEq/L   Glucose, Bld 176 (*) 70 - 99 mg/dL   HCT 27.0 (*) 36.0 - 46.0 %   Hemoglobin 9.2 (*) 12.0 - 15.0 g/dL  POCT I-STAT 3, BLOOD GAS (G3+)     Status: Abnormal   Collection Time    10/23/13 12:13 PM      Result Value Ref Range   pH, Arterial 7.393  7.350 - 7.450   pCO2 arterial 42.5  35.0 - 45.0 mmHg   pO2, Arterial 299.0 (*) 80.0 - 100.0 mmHg   Bicarbonate 25.9 (*) 20.0 - 24.0 mEq/L   TCO2 27  0 - 100 mmol/L   O2 Saturation 100.0     Acid-Base Excess 1.0  0.0 - 2.0 mmol/L   Sample type ARTERIAL    POCT I-STAT 3, BLOOD GAS (G3+)     Status: Abnormal   Collection Time    10/23/13  1:28 PM      Result Value Ref Range   pH, Arterial 7.340 (*) 7.350 - 7.450   pCO2 arterial 42.8  35.0 - 45.0 mmHg   pO2, Arterial 55.0 (*) 80.0 - 100.0 mmHg   Bicarbonate 23.3  20.0 - 24.0 mEq/L   TCO2 25  0 - 100 mmol/L   O2 Saturation 88.0     Acid-base deficit 3.0 (*) 0.0 - 2.0 mmol/L   Patient temperature 36.0 C     Sample type ARTERIAL    CBC     Status: Abnormal   Collection Time    10/23/13  1:30 PM      Result Value Ref Range   WBC 15.1 (*) 4.0 - 10.5 K/uL   RBC 3.74 (*) 3.87 -  5.11 MIL/uL   Hemoglobin 10.7 (*) 12.0 - 15.0 g/dL   HCT 30.9 (*) 36.0 - 46.0 %   MCV 82.6  78.0 - 100.0 fL   MCH 28.6  26.0 - 34.0 pg   MCHC 34.6  30.0 - 36.0 g/dL   RDW 15.1  11.5 - 15.5 %   Platelets 150  150 - 400 K/uL  PROTIME-INR     Status: Abnormal   Collection Time    10/23/13  1:30 PM      Result Value Ref Range   Prothrombin Time 15.6 (*) 11.6 - 15.2 seconds   INR 1.27  0.00 - 1.49  APTT     Status: None   Collection Time  10/23/13  1:30 PM      Result Value Ref Range   aPTT 30  24 - 37 seconds  POCT I-STAT 4, (NA,K, GLUC, HGB,HCT)     Status: Abnormal   Collection Time    10/23/13  1:30 PM      Result Value Ref Range   Sodium 137  137 - 147 mEq/L   Potassium 4.1  3.7 - 5.3 mEq/L   Glucose, Bld 165 (*) 70 - 99 mg/dL   HCT 34.0 (*) 36.0 - 46.0 %   Hemoglobin 11.6 (*) 12.0 - 15.0 g/dL  GLUCOSE, CAPILLARY     Status: None   Collection Time    10/23/13  2:21 PM      Result Value Ref Range   Glucose-Capillary 98  70 - 99 mg/dL  GLUCOSE, CAPILLARY     Status: Abnormal   Collection Time    10/23/13  3:05 PM      Result Value Ref Range   Glucose-Capillary 135 (*) 70 - 99 mg/dL  GLUCOSE, CAPILLARY     Status: Abnormal   Collection Time    10/23/13  4:07 PM      Result Value Ref Range   Glucose-Capillary 146 (*) 70 - 99 mg/dL  GLUCOSE, CAPILLARY     Status: Abnormal   Collection Time    10/23/13  5:03 PM      Result Value Ref Range   Glucose-Capillary 150 (*) 70 - 99 mg/dL  GLUCOSE, CAPILLARY     Status: Abnormal   Collection Time    10/23/13  6:06 PM      Result Value Ref Range   Glucose-Capillary 120 (*) 70 - 99 mg/dL  GLUCOSE, CAPILLARY     Status: Abnormal   Collection Time    10/23/13  6:58 PM      Result Value Ref Range   Glucose-Capillary 112 (*) 70 - 99 mg/dL  CBC     Status: Abnormal   Collection Time    10/23/13  7:15 PM      Result Value Ref Range   WBC 10.7 (*) 4.0 - 10.5 K/uL   RBC 3.31 (*) 3.87 - 5.11 MIL/uL   Hemoglobin 9.2 (*) 12.0 -  15.0 g/dL   HCT 26.9 (*) 36.0 - 46.0 %   MCV 81.3  78.0 - 100.0 fL   MCH 27.8  26.0 - 34.0 pg   MCHC 34.2  30.0 - 36.0 g/dL   RDW 14.7  11.5 - 15.5 %   Platelets 124 (*) 150 - 400 K/uL  MAGNESIUM     Status: Abnormal   Collection Time    10/23/13  7:15 PM      Result Value Ref Range   Magnesium 3.0 (*) 1.5 - 2.5 mg/dL  CREATININE, SERUM     Status: Abnormal   Collection Time    10/23/13  7:15 PM      Result Value Ref Range   Creatinine, Ser 1.60 (*) 0.50 - 1.10 mg/dL   GFR calc non Af Amer 31 (*) >90 mL/min   GFR calc Af Amer 36 (*) >90 mL/min  POCT I-STAT, CHEM 8     Status: Abnormal   Collection Time    10/23/13  7:16 PM      Result Value Ref Range   Sodium 137  137 - 147 mEq/L   Potassium 4.1  3.7 - 5.3 mEq/L   Chloride 102  96 - 112 mEq/L   BUN 29 (*) 6 -  23 mg/dL   Creatinine, Ser 1.60 (*) 0.50 - 1.10 mg/dL   Glucose, Bld 120 (*) 70 - 99 mg/dL   Calcium, Ion 1.26  1.13 - 1.30 mmol/L   TCO2 23  0 - 100 mmol/L   Hemoglobin 9.5 (*) 12.0 - 15.0 g/dL   HCT 28.0 (*) 36.0 - 46.0 %  GLUCOSE, CAPILLARY     Status: Abnormal   Collection Time    10/23/13  8:01 PM      Result Value Ref Range   Glucose-Capillary 116 (*) 70 - 99 mg/dL  GLUCOSE, CAPILLARY     Status: Abnormal   Collection Time    10/23/13  8:57 PM      Result Value Ref Range   Glucose-Capillary 115 (*) 70 - 99 mg/dL  GLUCOSE, CAPILLARY     Status: Abnormal   Collection Time    10/23/13 10:01 PM      Result Value Ref Range   Glucose-Capillary 129 (*) 70 - 99 mg/dL  GLUCOSE, CAPILLARY     Status: Abnormal   Collection Time    10/23/13 10:58 PM      Result Value Ref Range   Glucose-Capillary 121 (*) 70 - 99 mg/dL  GLUCOSE, CAPILLARY     Status: Abnormal   Collection Time    10/23/13 11:58 PM      Result Value Ref Range   Glucose-Capillary 123 (*) 70 - 99 mg/dL  GLUCOSE, CAPILLARY     Status: Abnormal   Collection Time    10/24/13 12:59 AM      Result Value Ref Range   Glucose-Capillary 126 (*) 70 - 99  mg/dL  GLUCOSE, CAPILLARY     Status: Abnormal   Collection Time    10/24/13  1:56 AM      Result Value Ref Range   Glucose-Capillary 131 (*) 70 - 99 mg/dL  POCT I-STAT 3, BLOOD GAS (G3+)     Status: None   Collection Time    10/24/13  2:21 AM      Result Value Ref Range   pH, Arterial 7.378  7.350 - 7.450   pCO2 arterial 39.3  35.0 - 45.0 mmHg   pO2, Arterial 92.0  80.0 - 100.0 mmHg   Bicarbonate 23.2  20.0 - 24.0 mEq/L   TCO2 24  0 - 100 mmol/L   O2 Saturation 97.0     Acid-base deficit 2.0  0.0 - 2.0 mmol/L   Patient temperature 36.7 C     Collection site ARTERIAL LINE     Drawn by Nurse     Sample type ARTERIAL    GLUCOSE, CAPILLARY     Status: Abnormal   Collection Time    10/24/13  2:56 AM      Result Value Ref Range   Glucose-Capillary 130 (*) 70 - 99 mg/dL  GLUCOSE, CAPILLARY     Status: Abnormal   Collection Time    10/24/13  3:49 AM      Result Value Ref Range   Glucose-Capillary 116 (*) 70 - 99 mg/dL  POCT I-STAT 3, BLOOD GAS (G3+)     Status: Abnormal   Collection Time    10/24/13  3:52 AM      Result Value Ref Range   pH, Arterial 7.351  7.350 - 7.450   pCO2 arterial 42.7  35.0 - 45.0 mmHg   pO2, Arterial 74.0 (*) 80.0 - 100.0 mmHg   Bicarbonate 23.7  20.0 - 24.0 mEq/L   TCO2 25  0 - 100 mmol/L   O2 Saturation 94.0     Acid-base deficit 2.0  0.0 - 2.0 mmol/L   Patient temperature 36.7 C     Collection site ARTERIAL LINE     Drawn by Nurse     Sample type ARTERIAL    CBC     Status: Abnormal   Collection Time    10/24/13  4:05 AM      Result Value Ref Range   WBC 11.3 (*) 4.0 - 10.5 K/uL   RBC 3.30 (*) 3.87 - 5.11 MIL/uL   Hemoglobin 9.3 (*) 12.0 - 15.0 g/dL   HCT 26.9 (*) 36.0 - 46.0 %   MCV 81.5  78.0 - 100.0 fL   MCH 28.2  26.0 - 34.0 pg   MCHC 34.6  30.0 - 36.0 g/dL   RDW 15.0  11.5 - 15.5 %   Platelets 167  150 - 400 K/uL  BASIC METABOLIC PANEL     Status: Abnormal   Collection Time    10/24/13  4:05 AM      Result Value Ref Range    Sodium 137  137 - 147 mEq/L   Potassium 4.2  3.7 - 5.3 mEq/L   Chloride 102  96 - 112 mEq/L   CO2 21  19 - 32 mEq/L   Glucose, Bld 127 (*) 70 - 99 mg/dL   BUN 28 (*) 6 - 23 mg/dL   Creatinine, Ser 1.60 (*) 0.50 - 1.10 mg/dL   Calcium 8.7  8.4 - 10.5 mg/dL   GFR calc non Af Amer 31 (*) >90 mL/min   GFR calc Af Amer 36 (*) >90 mL/min  MAGNESIUM     Status: Abnormal   Collection Time    10/24/13  4:05 AM      Result Value Ref Range   Magnesium 2.7 (*) 1.5 - 2.5 mg/dL  GLUCOSE, CAPILLARY     Status: Abnormal   Collection Time    10/24/13  5:20 AM      Result Value Ref Range   Glucose-Capillary 125 (*) 70 - 99 mg/dL  GLUCOSE, CAPILLARY     Status: Abnormal   Collection Time    10/24/13  5:59 AM      Result Value Ref Range   Glucose-Capillary 125 (*) 70 - 99 mg/dL  GLUCOSE, CAPILLARY     Status: Abnormal   Collection Time    10/24/13  6:57 AM      Result Value Ref Range   Glucose-Capillary 113 (*) 70 - 99 mg/dL  GLUCOSE, CAPILLARY     Status: Abnormal   Collection Time    10/24/13  8:05 AM      Result Value Ref Range   Glucose-Capillary 112 (*) 70 - 99 mg/dL  GLUCOSE, CAPILLARY     Status: Abnormal   Collection Time    10/24/13  9:07 AM      Result Value Ref Range   Glucose-Capillary 106 (*) 70 - 99 mg/dL    Intake/Output Summary (Last 24 hours) at 10/24/13 1140 Last data filed at 10/24/13 1100  Gross per 24 hour  Intake 8277.95 ml  Output   3810 ml  Net 4467.95 ml    ASSESSMENT AND PLAN:  CAD:  Status post CABG fairly well post op given her EF and comorbidities.    ISCHEMIC CARDIOMYOPATHY WITH ACUTE ON CHRONIC SYSTOLIC AND DIASTOLIC HF:    On milrinone post op.  I agree with continuing this for now.  First step is weaning the neo.    ATRIAL FIB:   She is in NSR.  Continuing IV amiodarone.  She had to be cardioverted in the room after the OR  CKD:  Creat is stable.     We will follow as needed.   Jeneen Rinks [facility] 10/24/2013 11:40 AM

## 2013-10-24 NOTE — Procedures (Signed)
Extubation Procedure Note  Patient Details:   Name: Elaine [name] DOB: 12/27/1940 MRN: [id number]   Airway Documentation:  Pre extubation: Pt completed Rapid Wean Protocol without complication. ABG within normal range. Pt follows all commands. NIF -28 VC 850. Cuff Leak present.  Post Extubation: Pt placed on 2L [place] Sats 100, Pt able to verbalize name/location. Rhonchi BBS. Pt clears most secretions on own. No Stridor noted.   Evaluation  O2 sats: stable throughout Complications: No apparent complications Patient did tolerate procedure well. Bilateral Breath Sounds: Rhonchi   Yes  Sharen Hint 10/24/2013, 2:43 AM

## 2013-10-24 NOTE — Progress Notes (Addendum)
[facility] DAILY ICU PROGRESS NOTE                   [address]            ,[place] [address]          [phone]   1 Day Post-Op Procedure(s) (LRB): CORONARY ARTERY BYPASS GRAFTING (CABG) (N/A) INTRAOPERATIVE TRANSESOPHAGEAL ECHOCARDIOGRAM (N/A) CLIPPING OF ATRIAL APPENDAGE (N/A)  Total Length of Stay:  LOS: 4 days   Subjective: Feels ok, mild nausea just started  Objective: Vital signs in last 24 hours: Temp:  [95.7 F (35.4 C)-98.1 F (36.7 C)] 97.7 F (36.5 C) (02/27 0700) Pulse Rate:  [79-132] 81 (02/27 0700) Cardiac Rhythm:  [-] Normal sinus rhythm;Atrial paced (02/27 0600) Resp:  [7-26] 15 (02/27 0700) BP: (78-127)/(42-76) 118/59 mmHg (02/27 0700) SpO2:  [75 %-100 %] 100 % (02/27 0700) Arterial Line BP: (73-144)/(41-85) 104/85 mmHg (02/27 0700) FiO2 (%):  [40 %-60 %] 40 % (02/27 0200) Weight:  [274 lb 0.5 oz (124.3 kg)] 274 lb 0.5 oz (124.3 kg) (02/27 0500)  Filed Weights   10/22/13 0500 10/23/13 0500 10/24/13 0500  Weight: 249 lb 9.6 oz (113.218 kg) 249 lb 9 oz (113.2 kg) 274 lb 0.5 oz (124.3 kg)    Weight change: 24 lb 7.5 oz (11.1 kg)   Hemodynamic parameters for last 24 hours: PAP: (34-55)/(18-36) 44/24 mmHg CO:  [4 L/min-5 L/min] 4.9 L/min CI:  [1.9 L/min/m2-2.4 L/min/m2] 2.3 L/min/m2  Intake/Output from previous day: 02/26 0701 - 02/27 0700 In: 8961.7 [I.V.:5904.7; Blood:1027; NG/GT:30; IV Piggyback:2000] Out: [id number] [Urine:1890; Blood:925; Chest Tube:710]  Intake/Output this shift:    Current Meds: Scheduled Meds: . acetaminophen  1,000 mg Oral 4 times per day   Or  . acetaminophen (TYLENOL) oral liquid 160 mg/5 mL  1,000 mg Per Tube 4 times per day  . aspirin EC  325 mg Oral Daily   Or  . aspirin  324 mg Per Tube Daily  . bisacodyl  10 mg Oral Daily   Or  . bisacodyl  10 mg Rectal Daily  . cefUROXime (ZINACEF)  IV  1.5 g Intravenous Q12H  . docusate sodium  200 mg Oral Daily  . insulin regular  0-10 Units Intravenous TID WC    . levothyroxine  112 mcg Oral QAC breakfast  . metoprolol tartrate  12.5 mg Oral BID   Or  . metoprolol tartrate  12.5 mg Per Tube BID  . [START ON 10/25/2013] pantoprazole  40 mg Oral Daily  . prednisoLONE acetate  1 drop Right Eye Daily  . sodium chloride  3 mL Intravenous Q12H   Continuous Infusions: . sodium chloride 20 mL/hr at 10/24/13 0700  . sodium chloride 20 mL/hr at 10/24/13 0700  . amiodarone (NEXTERONE PREMIX) 360 mg/200 mL dextrose 30 mg/hr (10/24/13 0700)  . dexmedetomidine Stopped (10/23/13 1900)  . DOPamine 3 mcg/kg/min (10/24/13 0700)  . insulin (NOVOLIN-R) infusion 3.2 Units/hr (10/24/13 0700)  . lactated ringers    . milrinone 0.3 mcg/kg/min (10/24/13 0700)  . nitroGLYCERIN Stopped (10/23/13 1330)  . phenylephrine (NEO-SYNEPHRINE) Adult infusion 75 mcg/min (10/24/13 0700)   PRN Meds:.metoprolol, midazolam, morphine injection, ondansetron (ZOFRAN) IV, oxyCODONE, sodium chloride  General appearance: alert, cooperative and no distress Neurologic: intact Heart: regular rate and rhythm Lungs: clear anteriorly Abdomen: soft, non-tender Extremities: + edema Wound: dressings CDI  Lab Results: CBC: Recent Labs  10/23/13 1915 10/23/13 1916 10/24/13 0405  WBC 10.7*  --  11.3*  HGB 9.2*  9.5* 9.3*  HCT 26.9* 28.0* 26.9*  PLT 124*  --  167   BMET:  Recent Labs  10/23/13 0232  10/23/13 1916 10/24/13 0405  NA 138  < > 137 137  K 5.1  < > 4.1 4.2  CL 99  --  102 102  CO2 28  --   --  21  GLUCOSE 120*  < > 120* 127*  BUN 38*  --  29* 28*  CREATININE 1.96*  < > 1.60* 1.60*  CALCIUM 9.3  --   --  8.7  < > = values in this interval not displayed.  PT/INR:  Recent Labs  10/23/13 1330  LABPROT 15.6*  INR 1.27   ABG    Component Value Date/Time   PHART 7.351 10/24/2013 0352   PCO2ART 42.7 10/24/2013 0352   PO2ART 74.0* 10/24/2013 0352   HCO3 23.7 10/24/2013 0352   TCO2 25 10/24/2013 0352   ACIDBASEDEF 2.0 10/24/2013 0352   O2SAT 94.0 10/24/2013 0352      Radiology: Dg Chest Portable 1 View  10/23/2013   CLINICAL DATA:  Postop  EXAM: PORTABLE CHEST - 1 VIEW  COMPARISON:  DG CHEST 1V PORT dated 10/22/2013; DG CHEST 1V PORT dated 10/21/2013; DG CHEST 1V PORT dated 10/20/2013  FINDINGS: Status post CABG. Endotracheal tube tip it is 5 cm above the carinal. NG tube crosses the gastroesophageal junction. Left-sided Swan-Ganz central line identified with tip in the right main pulmonary artery.  There is a left chest tube.  There is a mediastinal drain.  There is stable mild to moderate cardiac enlargement. There is moderate vascular congestion. There is peribronchial cuffing and mild interstitial prominence. No evidence of pneumothorax.  IMPRESSION: Mild postoperative pulmonary edema.   Electronically Signed   By: Skipper Cliche M.D.   On: 10/23/2013 14:08     Assessment/Plan: S/P Procedure(s) (LRB): CORONARY ARTERY BYPASS GRAFTING (CABG) (N/A) INTRAOPERATIVE TRANSESOPHAGEAL ECHOCARDIOGRAM (N/A) CLIPPING OF ATRIAL APPENDAGE (N/A)  1 feels pretty well 2 wean neo as able, BP has been low at times. On Milrinone and dopamine also 3 sinus rhythm in 80's with BBB- on amio for afib  4 CT's- 710 cc, cont to monitor, poss d/x later today 5 creat is stable at 1.6 6 expected ABL anemia- stable, monmitor 7 will need some diuresis when not requiring as much pressor for volume overload  GOLD,WAYNE E 10/24/2013 7:32 AM   I have seen and examined the patient and agree with the assessment and plan as outlined. Maintaining NSR w/ stable hemodynamics on neo, dopamine and milrinone No further Afib on amiodarone - Afib was poorly tolerated both pre- and post-op Underlying severe LV dysfunction w/ recent large anterior/septal acute MI and chronic hypertensive heart disease w/ LVH and diastolic dysfunction   Mobilize  D/C swan  Wean neo as tolerated  Continue milrinone and dopamine for now  Hold diuretics until later today or tomorrow  Add levemir  insulin and wean drip per protocol  Lovenox for DVT prophylaxis  PT consult once lines and tubes out to help w/ mobility  [name],[name] 10/24/2013 9:09 AM

## 2013-10-24 NOTE — Progress Notes (Signed)
Patient ID: Elaine [name], female   DOB: 1941/07/29, 73 y.o.   MRN: [id number]  SICU Evening Rounds:  Hemodynamically stable on dop 3, milrinone 0.3, neo 35 mcg  Sinus rhythm 82 on IV amio.  Urine output ok, creat up to 1.9 this pm. BMET    Component Value Date/Time   NA 134* 10/24/2013 1630   K 4.1 10/24/2013 1630   CL 101 10/24/2013 1630   CO2 21 10/24/2013 0405   GLUCOSE 162* 10/24/2013 1630   BUN 28* 10/24/2013 1630   CREATININE 1.90* 10/24/2013 1630   CALCIUM 8.7 10/24/2013 0405   GFRNONAA 29* 10/24/2013 1600   GFRAA 34* 10/24/2013 1600    A/P: Remains inotrope dependent. Wean neo as tolerated. Follow up renal function in am.

## 2013-10-24 NOTE — Anesthesia Postprocedure Evaluation (Signed)
  Anesthesia Post-op Note  Patient: Elaine [name]  Procedure(s) Performed: Procedure(s): CORONARY ARTERY BYPASS GRAFTING (CABG) (N/A) INTRAOPERATIVE TRANSESOPHAGEAL ECHOCARDIOGRAM (N/A) CLIPPING OF ATRIAL APPENDAGE (N/A)  Patient Location: SICU  Anesthesia Type:General  Level of Consciousness: awake, alert  and oriented  Airway and Oxygen Therapy: Patient Spontanous Breathing and Patient connected to nasal cannula oxygen  Post-op Pain: mild  Post-op Assessment: Post-op Vital signs reviewed, Patient's Cardiovascular Status Stable, Respiratory Function Stable, Patent Airway, No signs of Nausea or vomiting and Pain level controlled  Post-op Vital Signs: stable  Complications: No apparent anesthesia complications

## 2013-10-25 ENCOUNTER — Inpatient Hospital Stay (HOSPITAL_COMMUNITY): Payer: Medicare HMO

## 2013-10-25 LAB — GLUCOSE, CAPILLARY
GLUCOSE-CAPILLARY: 119 mg/dL — AB (ref 70–99)
GLUCOSE-CAPILLARY: 112 mg/dL — AB (ref 70–99)
GLUCOSE-CAPILLARY: 155 mg/dL — AB (ref 70–99)
GLUCOSE-CAPILLARY: 127 mg/dL — AB (ref 70–99)
GLUCOSE-CAPILLARY: 69 mg/dL — AB (ref 70–99)
Glucose-Capillary: 61 mg/dL — ABNORMAL LOW (ref 70–99)
Glucose-Capillary: 70 mg/dL (ref 70–99)

## 2013-10-25 LAB — TYPE AND SCREEN
ABO/RH(D): A POS
Antibody Screen: NEGATIVE
UNIT DIVISION: 0
Unit division: 0
Unit division: 0
Unit division: 0
Unit division: 0
Unit division: 0

## 2013-10-25 LAB — CBC
HCT: 25.5 % — ABNORMAL LOW (ref 36.0–46.0)
HEMOGLOBIN: 8.8 g/dL — AB (ref 12.0–15.0)
MCH: 28.5 pg (ref 26.0–34.0)
MCHC: 34.5 g/dL (ref 30.0–36.0)
MCV: 82.5 fL (ref 78.0–100.0)
Platelets: 138 10*3/uL — ABNORMAL LOW (ref 150–400)
RBC: 3.09 MIL/uL — AB (ref 3.87–5.11)
RDW: 15.4 % (ref 11.5–15.5)
WBC: 14 10*3/uL — ABNORMAL HIGH (ref 4.0–10.5)

## 2013-10-25 LAB — BASIC METABOLIC PANEL
BUN: 28 mg/dL — AB (ref 6–23)
CO2: 21 mEq/L (ref 19–32)
Calcium: 8.7 mg/dL (ref 8.4–10.5)
Chloride: 98 mEq/L (ref 96–112)
Creatinine, Ser: 1.67 mg/dL — ABNORMAL HIGH (ref 0.50–1.10)
GFR calc non Af Amer: 29 mL/min — ABNORMAL LOW (ref >90)
GFR, EST AFRICAN AMERICAN: 34 mL/min — AB (ref >90)
Glucose, Bld: 160 mg/dL — ABNORMAL HIGH (ref 70–99)
POTASSIUM: 4.2 meq/L (ref 3.7–5.3)
SODIUM: 133 meq/L — AB (ref 137–147)

## 2013-10-25 LAB — CARBOXYHEMOGLOBIN
CARBOXYHEMOGLOBIN: 1.2 % (ref 0.5–1.5)
Methemoglobin: 0.9 % (ref 0.0–1.5)
O2 SAT: 70.4 %
TOTAL HEMOGLOBIN: 9 g/dL — AB (ref 12.0–16.0)

## 2013-10-25 MED ORDER — PHENYLEPHRINE HCL 10 MG/ML IJ SOLN
0.0000 ug/min | INTRAVENOUS | Status: DC
  Administered 2013-10-25 (×2): 55 ug/min via INTRAVENOUS
  Filled 2013-10-25 (×3): qty 4

## 2013-10-25 NOTE — Progress Notes (Signed)
PT Cancellation Note  Patient Details Name: IVE CEESAY MRN: [id number] DOB: 1941-02-23   Cancelled Treatment:    Reason Eval/Treat Not Completed: Patient not medically ready. Noted pt continues to require incr amount of neo-synephrine and currently up to 58mcg. Spoke with RN who agreed pt is not ready for mobility with PT. Will continue to follow and complete evaluation as appropriate.   Ajax Schroll 10/25/2013, 2:44 PM Pager [phone]

## 2013-10-25 NOTE — Progress Notes (Signed)
2 Days Post-Op Procedure(s) (LRB): CORONARY ARTERY BYPASS GRAFTING (CABG) (N/A) INTRAOPERATIVE TRANSESOPHAGEAL ECHOCARDIOGRAM (N/A) CLIPPING OF ATRIAL APPENDAGE (N/A) Subjective:  No complaints  Objective: Vital signs in last 24 hours: Temp:  [97.8 F (36.6 C)-98.7 F (37.1 C)] 98.1 F (36.7 C) (02/28 1228) Pulse Rate:  [80-90] 87 (02/28 1200) Cardiac Rhythm:  [-] Normal sinus rhythm (02/28 0755) Resp:  [0-28] 22 (02/28 1300) BP: (81-125)/(37-88) 115/45 mmHg (02/28 1300) SpO2:  [91 %-100 %] 100 % (02/28 1200) Arterial Line BP: (58-123)/(45-118) 98/94 mmHg (02/27 1700) Weight:  [124.9 kg (275 lb 5.7 oz)] 124.9 kg (275 lb 5.7 oz) (02/28 0630)  Hemodynamic parameters for last 24 hours:    Intake/Output from previous day: 02/27 0701 - 02/28 0700 In: 3071.8 [P.O.:680; I.V.:2291.8; IV Piggyback:100] Out: 1955 [Urine:1325; Chest Tube:630] Intake/Output this shift: Total I/O In: 474.6 [I.V.:474.6] Out: 240 [Urine:140; Chest Tube:100]  General appearance: alert and cooperative Neurologic: intact Heart: regular rate and rhythm, S1, S2 normal, no murmur, click, rub or gallop Lungs: clear to auscultation bilaterally Extremities: edema mild Wound: dressing dry  Lab Results:  Recent Labs  10/24/13 1600 10/24/13 1630 10/25/13 0330  WBC 14.0*  --  14.0*  HGB 9.6* 10.2* 8.8*  HCT 27.2* 30.0* 25.5*  PLT 132*  --  138*   BMET:  Recent Labs  10/24/13 0405  10/24/13 1630 10/25/13 0330  NA 137  --  134* 133*  K 4.2  --  4.1 4.2  CL 102  --  101 98  CO2 21  --   --  21  GLUCOSE 127*  --  162* 160*  BUN 28*  --  28* 28*  CREATININE 1.60*  < > 1.90* 1.67*  CALCIUM 8.7  --   --  8.7  < > = values in this interval not displayed.  PT/INR:  Recent Labs  10/23/13 1330  LABPROT 15.6*  INR 1.27   ABG    Component Value Date/Time   PHART 7.351 10/24/2013 0352   HCO3 23.7 10/24/2013 0352   TCO2 21 10/24/2013 1630   ACIDBASEDEF 2.0 10/24/2013 0352   O2SAT 94.0 10/24/2013  0352   CBG (last 3)   Recent Labs  10/25/13 0404 10/25/13 0832 10/25/13 1227  GLUCAP 119* 112* 155*    Assessment/Plan: S/P Procedure(s) (LRB): CORONARY ARTERY BYPASS GRAFTING (CABG) (N/A) INTRAOPERATIVE TRANSESOPHAGEAL ECHOCARDIOGRAM (N/A) CLIPPING OF ATRIAL APPENDAGE (N/A)  She remains on dop 3, milrinone 0.3, neo 55 mcg with borderline BP. Preop EF 20-25%. Will wean milrinone since that may be contributing to need for neo. Continue dopamine.  Chronic kidney disease: creatinine appears to be about baseline for her.  Diabetes: glucose under adequate control.  Continue OOB, IS  Will keep chest tubes in today.         LOS: 5 days    Gilford Raid K 10/25/2013

## 2013-10-25 NOTE — Progress Notes (Addendum)
Hypoglycemic Event  CBG: 61  Treatment: 15 GM carbohydrate snack  Symptoms: None  Follow-up CBG: Time 2045 CBG Result :70  Possible Reasons for Event: Inadequate meal intake  Comments/MD notified: Dr Cyndia Bent to be notified; order received to hold Levemir dose for tonight.    Elaine [name]  Remember to initiate Hypoglycemia Order Set & complete

## 2013-10-25 NOTE — Progress Notes (Signed)
Patient ID: Elaine [name], female   DOB: [birth year], 73 y.o.   MRN: [id number]  SICU Evening Rounds:  Hemodynamically stable. Milrinone is off and Co-ox is 70%. Will wean neo and continue dopamine.  Urine output ok

## 2013-10-25 NOTE — Progress Notes (Signed)
Hypoglycemic Event  CBG: 69  Treatment: 15 GM carbohydrate snack  Symptoms: None  Follow-up CBG: Time:2010 CBG Result:61  Possible Reasons for Event: Inadequate meal intake  Comments/MD notified:Dr Bartle to be notified    Elaine [name]  Remember to initiate Hypoglycemia Order Set & complete

## 2013-10-26 ENCOUNTER — Inpatient Hospital Stay (HOSPITAL_COMMUNITY): Payer: Medicare HMO

## 2013-10-26 LAB — GLUCOSE, CAPILLARY
GLUCOSE-CAPILLARY: 135 mg/dL — AB (ref 70–99)
GLUCOSE-CAPILLARY: 68 mg/dL — AB (ref 70–99)
GLUCOSE-CAPILLARY: 79 mg/dL (ref 70–99)
GLUCOSE-CAPILLARY: 127 mg/dL — AB (ref 70–99)
Glucose-Capillary: 138 mg/dL — ABNORMAL HIGH (ref 70–99)
Glucose-Capillary: 96 mg/dL (ref 70–99)
Glucose-Capillary: 86 mg/dL (ref 70–99)
Glucose-Capillary: 98 mg/dL (ref 70–99)
Glucose-Capillary: 104 mg/dL — ABNORMAL HIGH (ref 70–99)

## 2013-10-26 LAB — BASIC METABOLIC PANEL
BUN: 30 mg/dL — ABNORMAL HIGH (ref 6–23)
CO2: 21 mEq/L (ref 19–32)
Calcium: 8.6 mg/dL (ref 8.4–10.5)
Chloride: 96 mEq/L (ref 96–112)
Creatinine, Ser: 1.5 mg/dL — ABNORMAL HIGH (ref 0.50–1.10)
GFR calc Af Amer: 39 mL/min — ABNORMAL LOW (ref >90)
GFR calc non Af Amer: 33 mL/min — ABNORMAL LOW (ref >90)
Glucose, Bld: 138 mg/dL — ABNORMAL HIGH (ref 70–99)
Potassium: 4.4 mEq/L (ref 3.7–5.3)
Sodium: 133 mEq/L — ABNORMAL LOW (ref 137–147)

## 2013-10-26 LAB — CBC
HCT: 26 % — ABNORMAL LOW (ref 36.0–46.0)
Hemoglobin: 9.1 g/dL — ABNORMAL LOW (ref 12.0–15.0)
MCH: 28.3 pg (ref 26.0–34.0)
MCHC: 35 g/dL (ref 30.0–36.0)
MCV: 80.7 fL (ref 78.0–100.0)
PLATELETS: 158 10*3/uL (ref 150–400)
RBC: 3.22 MIL/uL — ABNORMAL LOW (ref 3.87–5.11)
RDW: 15.1 % (ref 11.5–15.5)
WBC: 12.7 10*3/uL — AB (ref 4.0–10.5)

## 2013-10-26 MED ORDER — AMIODARONE HCL 200 MG PO TABS
400.0000 mg | ORAL_TABLET | Freq: Two times a day (BID) | ORAL | Status: DC
  Administered 2013-10-26 – 2013-10-27 (×4): 400 mg via ORAL
  Filled 2013-10-26 (×6): qty 2

## 2013-10-26 NOTE — Progress Notes (Signed)
3 Days Post-Op Procedure(s) (LRB): CORONARY ARTERY BYPASS GRAFTING (CABG) (N/A) INTRAOPERATIVE TRANSESOPHAGEAL ECHOCARDIOGRAM (N/A) CLIPPING OF ATRIAL APPENDAGE (N/A) Subjective:  No complaints.  Walked a short distance  Objective: Vital signs in last 24 hours: Temp:  [97.5 F (36.4 C)-98.5 F (36.9 C)] 97.5 F (36.4 C) (03/01 1141) Pulse Rate:  [67-133] 73 (03/01 1300) Cardiac Rhythm:  [-] Normal sinus rhythm (03/01 1200) Resp:  [4-25] 14 (03/01 1300) BP: (84-132)/(38-70) 111/55 mmHg (03/01 1300) SpO2:  [95 %-100 %] 100 % (03/01 1300) Weight:  [125.5 kg (276 lb 10.8 oz)] 125.5 kg (276 lb 10.8 oz) (03/01 0600)  Hemodynamic parameters for last 24 hours:    Intake/Output from previous day: 02/28 0701 - 03/01 0700 In: 2067.2 [P.O.:480; I.V.:1587.2] Out: 1460 [Urine:1065; Chest Tube:395] Intake/Output this shift: Total I/O In: 407.1 [P.O.:120; I.V.:287.1] Out: 610 [Urine:510; Chest Tube:100]  General appearance: alert and cooperative Neurologic: intact Heart: regular rate and rhythm, S1, S2 normal, no murmur, click, rub or gallop Lungs: clear to auscultation bilaterally Extremities: edema moderate Wound: dressing dry  Lab Results:  Recent Labs  10/25/13 0330 10/26/13 0330  WBC 14.0* 12.7*  HGB 8.8* 9.1*  HCT 25.5* 26.0*  PLT 138* 158   BMET:  Recent Labs  10/25/13 0330 10/26/13 0330  NA 133* 133*  K 4.2 4.4  CL 98 96  CO2 21 21  GLUCOSE 160* 138*  BUN 28* 30*  CREATININE 1.67* 1.50*  CALCIUM 8.7 8.6    PT/INR: No results found for this basename: LABPROT, INR,  in the last 72 hours ABG    Component Value Date/Time   PHART 7.351 10/24/2013 0352   HCO3 23.7 10/24/2013 0352   TCO2 21 10/24/2013 1630   ACIDBASEDEF 2.0 10/24/2013 0352   O2SAT 70.4 10/25/2013 1725   CBG (last 3)   Recent Labs  10/26/13 0736 10/26/13 0820 10/26/13 1138  GLUCAP 68* 79 127*    Assessment/Plan: S/P Procedure(s) (LRB): CORONARY ARTERY BYPASS GRAFTING (CABG)  (N/A) INTRAOPERATIVE TRANSESOPHAGEAL ECHOCARDIOGRAM (N/A) CLIPPING OF ATRIAL APPENDAGE (N/A) Hemodynamically stable on dop 3. Wean as tolerated Postop A-fib: maintaining sinus on IV amio. Will switch to PO. Mobilize Diuresis Diabetes control d/c tubes/lines Continue foley due to patient in ICU and urinary output monitoring   LOS: 6 days    Elaine [name] K 10/26/2013

## 2013-10-26 NOTE — Progress Notes (Signed)
Anesthesiology Follow-up:  POD #3 73 year female S/P CABG X 3 with clipping of LAA  Awake and alert, neuro intact, hemodynamically stable on dopamine and amiodarone. Post-op afib requiring cardioversion after surgery, now maintaining SR.  VS: T-36.4 BP 113/53 RR 14 HR 75 O2 Sat 100%  K-4.4 Na- 133 BUN/Cr- 30/1.50 glucose 117 H/H: 9.1/26.0 Plts 158,000  Extubated 14 hours post-op, renal function stable, no apparent complications  Roberts Gaudy, MD

## 2013-10-26 NOTE — Progress Notes (Signed)
Patient ID: Elaine [name], female   DOB: 04-Oct-1940, 73 y.o.   MRN: [id number]  SICU Evening rounds:  Hemodynamically stable with BP 98/45 on dop 2 Remains sinus 70  Urine output good.

## 2013-10-26 NOTE — Progress Notes (Deleted)
PT Cancellation Note  Patient Details Name: Elaine [name] MRN: [id number] DOB: 1941/04/14   Cancelled Treatment:    Reason Eval Not Completed: Per [name], RN pt just walked 50 ft with nursing and went back to bed. Pt currently resting. Will attempt to see later today.   [name] 10/26/2013, 10:56 AM Pager [phone]

## 2013-10-26 NOTE — Evaluation (Signed)
Physical Therapy Evaluation Patient Details Name: Elaine [name] MRN: [id number] DOB: 1941/03/13 Today's Date: 10/26/2013 Time: [id number] PT Time Calculation (min): 29 min  PT Assessment / Plan / Recommendation History of Present Illness  73 y.o. adm 10/20/13 with several weeks of ongoing/persistent chest pressure and now shortness of breath and edema. Upon EMS arrival EKG showed atrial fibrillation with a left bundle branch block. Code STEMI was called. 2/26 underwent CABG x 3 with clipping of Lt atrial appendage. PMH of HTN, DM-2, obesity & former smoker  Clinical Impression  Patient is s/p CABG surgery resulting in functional limitations due to the deficits listed below (see PT Problem List). Pt is primary caregiver for her husband (due to dementia), however she has made arrangements for family to assist him and will stay with her daughter on discharge. Patient will benefit from skilled PT to increase their independence and safety with mobility to allow discharge to the venue listed below.       PT Assessment  Patient needs continued PT services    Follow Up Recommendations  Home health PT;Supervision for mobility/OOB    Does the patient have the potential to tolerate intense rehabilitation      Barriers to Discharge Inaccessible home environment daughter's home with 6 steps to enter with no rail    Equipment Recommendations  None recommended by PT    Recommendations for Other Services OT consult   Frequency Min 3X/week    Precautions / Restrictions Precautions Precautions: Sternal;Fall Restrictions Weight Bearing Restrictions: No Other Position/Activity Restrictions: sternal precautions; pt able to state "no pushing" only   Pertinent Vitals/Pain BP 110/69 seated; 119/62 standing  HR 74-88 SaO2>94% on RA      Mobility  Bed Mobility Overal bed mobility: Needs Assistance;+2 for physical assistance Bed Mobility: Sit to Sidelying Sit to sidelying: Max assist;+2 for physical  assistance General bed mobility comments: assist to lower shoulders and lift legs onto bed while maintaining sternal precautions Transfers Overall transfer level: Needs assistance Equipment used: Pushed w/c Transfers: Sit to/from Stand Sit to Stand: Min assist;+2 physical assistance;From elevated surface General transfer comment: x2 (pt became dizzy and returned to sit); vc for technique to maintain sternal precuations and pt holding pillow Ambulation/Gait Ambulation/Gait assistance: Min assist;+2 physical assistance;+2 safety/equipment Ambulation Distance (Feet): 40 Feet Assistive device:  (w/c) Gait Pattern/deviations: Step-through pattern;Decreased stride length;Wide base of support Gait velocity interpretation: Below normal speed for age/gender General Gait Details: frequent cues to keep her eyes open    Exercises General Exercises - Lower Extremity Ankle Circles/Pumps: AROM;10 reps;Both;Seated   PT Diagnosis: Difficulty walking;Generalized weakness;Acute pain  PT Problem List: Decreased strength;Decreased activity tolerance;Decreased balance;Decreased mobility;Decreased knowledge of use of DME;Decreased knowledge of precautions;Cardiopulmonary status limiting activity;Obesity;Pain PT Treatment Interventions: DME instruction;Gait training;Stair training;Functional mobility training;Therapeutic activities;Patient/family education     PT Goals(Current goals can be found in the care plan section) Acute Rehab PT Goals Patient Stated Goal: eventually return home to care for her husband with mild dementia PT Goal Formulation: With patient Time For Goal Achievement: 11/02/13 Potential to Achieve Goals: Good  Visit Information  Last PT Received On: 10/26/13 Assistance Needed: +2 Reason Eval/Treat Not Completed: Other (comment) History of Present Illness: 73 y.o. adm 10/20/13 with several weeks of ongoing/persistent chest pressure and now shortness of breath and edema. Upon EMS arrival  EKG showed atrial fibrillation with a left bundle branch block. Code STEMI was called. 2/26 underwent CABG x 3 with clipping of Lt atrial appendage. PMH of HTN, DM-2, obesity &  former smoker       Prior [place] expects to be discharged to:: Private residence Living Arrangements: Spouse/significant other Available Help at Discharge: Family;Available 24 hours/day (plans to stay with her daughter) Type of Home: Apartment Home Access: Stairs to enter [employer] of Steps: 6 Entrance Stairs-Rails: None Home Layout: One level Home Equipment: Cane - single point;Walker - 4 wheels;Bedside commode;Shower seat Additional Comments: spouse has early dementia; grandson & son are caring for him; pt to go stay with her daughter Prior Function Level of Independence: Independent with assistive device(s) Comments: used cane outside; used seat in shower Communication Communication: No difficulties Dominant Hand: Right    Cognition  Cognition Arousal/Alertness: Awake/alert Behavior During Therapy: WFL for tasks assessed/performed Overall Cognitive Status: Within Functional Limits for tasks assessed    Extremity/Trunk Assessment Upper Extremity Assessment Upper Extremity Assessment: Overall WFL for tasks assessed (within sternal precautions) Lower Extremity Assessment Lower Extremity Assessment: Generalized weakness Cervical / Trunk Assessment Cervical / Trunk Assessment: Normal   Balance Balance Overall balance assessment: Needs assistance General Comments General comments (skin integrity, edema, etc.): remains on neosynephrine for BP support; BP stable throughout session despite lightheadedness  End of Session PT - End of Session Activity Tolerance: Patient limited by fatigue Patient left: in bed;with call bell/phone within reach;with nursing/sitter in room;with family/visitor present Nurse Communication: Mobility status  GP      Rhema Boyett 10/26/2013, 11:47 AM Pager [phone]

## 2013-10-26 NOTE — Addendum Note (Signed)
Addendum created 10/26/13 1359 by Roberts Gaudy, MD   Modules edited: Clinical Notes   Clinical Notes:  File: [id number]; Pend: [id number]; Pend: [id number]

## 2013-10-27 ENCOUNTER — Inpatient Hospital Stay (HOSPITAL_COMMUNITY): Payer: Medicare HMO

## 2013-10-27 DIAGNOSIS — R609 Edema, unspecified: Secondary | ICD-10-CM

## 2013-10-27 LAB — BASIC METABOLIC PANEL
BUN: 31 mg/dL — AB (ref 6–23)
CHLORIDE: 97 meq/L (ref 96–112)
CO2: 22 meq/L (ref 19–32)
CREATININE: 1.21 mg/dL — AB (ref 0.50–1.10)
Calcium: 8.5 mg/dL (ref 8.4–10.5)
GFR calc Af Amer: 50 mL/min — ABNORMAL LOW (ref >90)
GFR calc non Af Amer: 43 mL/min — ABNORMAL LOW (ref >90)
Glucose, Bld: 91 mg/dL (ref 70–99)
POTASSIUM: 4.3 meq/L (ref 3.7–5.3)
Sodium: 130 mEq/L — ABNORMAL LOW (ref 137–147)

## 2013-10-27 LAB — GLUCOSE, CAPILLARY
GLUCOSE-CAPILLARY: 116 mg/dL — AB (ref 70–99)
GLUCOSE-CAPILLARY: 198 mg/dL — AB (ref 70–99)
GLUCOSE-CAPILLARY: 175 mg/dL — AB (ref 70–99)
Glucose-Capillary: 120 mg/dL — ABNORMAL HIGH (ref 70–99)
Glucose-Capillary: 132 mg/dL — ABNORMAL HIGH (ref 70–99)
Glucose-Capillary: 60 mg/dL — ABNORMAL LOW (ref 70–99)
Glucose-Capillary: 74 mg/dL (ref 70–99)
Glucose-Capillary: 96 mg/dL (ref 70–99)

## 2013-10-27 LAB — CBC
HEMATOCRIT: 26.4 % — AB (ref 36.0–46.0)
Hemoglobin: 9.2 g/dL — ABNORMAL LOW (ref 12.0–15.0)
MCH: 28 pg (ref 26.0–34.0)
MCHC: 34.8 g/dL (ref 30.0–36.0)
MCV: 80.5 fL (ref 78.0–100.0)
Platelets: 173 10*3/uL (ref 150–400)
RBC: 3.28 MIL/uL — ABNORMAL LOW (ref 3.87–5.11)
RDW: 15.5 % (ref 11.5–15.5)
WBC: 10.3 10*3/uL (ref 4.0–10.5)

## 2013-10-27 MED ORDER — INSULIN ASPART 100 UNIT/ML ~~LOC~~ SOLN: 0.0000 [IU] | SUBCUTANEOUS | Status: DC

## 2013-10-27 MED ORDER — ENSURE COMPLETE PO LIQD
237.0000 mL | Freq: Two times a day (BID) | ORAL | Status: DC
  Administered 2013-10-27 – 2013-10-28 (×2): 237 mL via ORAL

## 2013-10-27 MED ORDER — INSULIN DETEMIR 100 UNIT/ML ~~LOC~~ SOLN
20.0000 [IU] | Freq: Every day | SUBCUTANEOUS | Status: DC
Start: 2013-10-27 — End: 2013-10-29
  Administered 2013-10-27 – 2013-10-28 (×2): 20 [IU] via SUBCUTANEOUS
  Filled 2013-10-27 (×2): qty 0.2

## 2013-10-27 MED ORDER — INSULIN ASPART 100 UNIT/ML ~~LOC~~ SOLN
0.0000 [IU] | Freq: Three times a day (TID) | SUBCUTANEOUS | Status: DC
  Administered 2013-10-27: 4 [IU] via SUBCUTANEOUS
  Administered 2013-10-27: 2 [IU] via SUBCUTANEOUS

## 2013-10-27 MED FILL — Magnesium Sulfate Inj 50%: INTRAMUSCULAR | Qty: 10 | Status: AC

## 2013-10-27 MED FILL — Potassium Chloride Inj 2 mEq/ML: INTRAVENOUS | Qty: 40 | Status: AC

## 2013-10-27 MED FILL — Heparin Sodium (Porcine) Inj 1000 Unit/ML: INTRAMUSCULAR | Qty: 30 | Status: AC

## 2013-10-27 NOTE — Progress Notes (Signed)
CRITICAL VALUE ALERT  Critical value received: CBG 60  Date of notification:  10/27/2013  Time of notification:  ~0725  Critical value read back: yes    Nurse who received alert:  Reather Laurence RN  MD notified (1st page):  N/a, will notify with AM rounds, pt given Salli Real and will re check  Time of first page:  N/a - CBG recheck [phone]  MD notified (2nd page):  Time of second page:  Responding MD:   Time MD responded:

## 2013-10-27 NOTE — Progress Notes (Signed)
      [address]       [place] [address]             [phone]        CARDIOTHORACIC SURGERY PROGRESS NOTE   R4 Days Post-Op Procedure(s) (LRB): CORONARY ARTERY BYPASS GRAFTING (CABG) (N/A) INTRAOPERATIVE TRANSESOPHAGEAL ECHOCARDIOGRAM (N/A) CLIPPING OF ATRIAL APPENDAGE (N/A)  Subjective: No specific complaints.  Mild soreness in chest.  Denies SOB.  Marginal appetite but no nausea or abdominal pain.  Slow progress w/ mobility.  Objective: Vital signs: BP Readings from Last 1 Encounters:  10/27/13 93/53   Pulse Readings from Last 1 Encounters:  10/27/13 66   Resp Readings from Last 1 Encounters:  10/27/13 13   Temp Readings from Last 1 Encounters:  10/27/13 97.4 F (36.3 C) Oral    Hemodynamics:    Physical Exam:  Rhythm:   sinus  Breath sounds: Diminished at bases  Heart sounds:  RRR  Incisions:  Clean and dry  Abdomen:  Soft, non-distended, non-tender  Extremities:  Warm, well-perfused    Intake/Output from previous day: 03/01 0701 - 03/02 0700 In: 1265.8 [P.O.:480; I.V.:785.8] Out: 1945 [Urine:1835; Chest Tube:110] Intake/Output this shift:    Lab Results:  CBC: Recent Labs  10/26/13 0330 10/27/13 0349  WBC 12.7* 10.3  HGB 9.1* 9.2*  HCT 26.0* 26.4*  PLT 158 173    BMET:  Recent Labs  10/26/13 0330 10/27/13 0349  NA 133* 130*  K 4.4 4.3  CL 96 97  CO2 21 22  GLUCOSE 138* 91  BUN 30* 31*  CREATININE 1.50* 1.21*  CALCIUM 8.6 8.5     CBG (last 3)   Recent Labs  10/27/13 0346 10/27/13 0722 10/27/13 0743  GLUCAP 96 60* 74    ABG    Component Value Date/Time   PHART 7.351 10/24/2013 0352   PCO2ART 42.7 10/24/2013 0352   PO2ART 74.0* 10/24/2013 0352   HCO3 23.7 10/24/2013 0352   TCO2 21 10/24/2013 1630   ACIDBASEDEF 2.0 10/24/2013 0352   O2SAT 70.4 10/25/2013 1725    CXR: PORTABLE CHEST - 1 VIEW  COMPARISON: DG CHEST 1V PORT dated 10/26/2013; DG CHEST 1V PORT dated  10/25/2013  FINDINGS:  Left chest  tube and mediastinal drain removed. No pneumothorax  identified.  Left IJ introducer sheath remains in place. Prior CABG noted with  atrial appendage clip in place.  Continued obscuration of the left hemidiaphragm. Hazy density at  both lung bases favoring small pleural effusions. Mild cardiomegaly  without edema. There is a small amount of atelectasis along the left  chest tube tract.  IMPRESSION:  1. Left chest tube and mediastinal drain a been removed. No  pneumothorax or complicating feature.  Electronically Signed  By: Sherryl Barters M.D.  On: 10/27/2013 07:30    Assessment/Plan: S/P Procedure(s) (LRB): CORONARY ARTERY BYPASS GRAFTING (CABG) (N/A) INTRAOPERATIVE TRANSESOPHAGEAL ECHOCARDIOGRAM (N/A) CLIPPING OF ATRIAL APPENDAGE (N/A)  Overall stable POD4 Maintaining NSR w/ stable BP although still on low dose dopamine 3 mcg/kg/min Pre-op acute anterior MI w/ severe LV dysfunction Expected post op acute blood loss anemia, mild, stable Expected post op atelectasis, mild Expected post op volume excess, mild, diuresing some spontaneously Chronic kidney disease, renal function stable Type II diabetes mellitus, some hypoglycemia last 24 hours Morbid obesity   Wean dopamine as tolerated  Mobilize  Continue PT  Decrease levemir insulin  Transfer step-down once stable off Dopamine   Elaine [name] H 10/27/2013 8:10 AM

## 2013-10-27 NOTE — Progress Notes (Signed)
INITIAL NUTRITION ASSESSMENT  DOCUMENTATION CODES Per approved criteria  -Morbid Obesity   INTERVENTION: Ensure Complete po BID, each supplement provides 350 kcal and 13 grams of protein RD to follow for nutrition care plan  NUTRITION DIAGNOSIS: Inadequate oral intake related to limited appetite as evidenced by RN report  Goal: Pt to meet >/= 90% of their estimated nutrition needs   Monitor:  PO & supplemental intake, weight, labs, I/O's  Reason for Assessment: poor PO intake  73 y.o. female  Admitting Dx: S/P CABG x 3  ASSESSMENT: 73 y.o. female with PMH of HTN, DM-2, obesity & former smoker who has several weeks of ongoing/persistent chest pressure and now shortness of breath and edema. She has been on aggressive therapy for reflux that has not been effective. This morning at roughly 3:30: This is the became notably worse and she became more short of breath with worsening pressure.   After calling her primary care physician's office or to call 911. Upon EMS arrival EKG showed what looked like either sinus tachycardia or atrial fibrillation with a left bundle branch block. Intermittently the computer read the left pulmonary clock as being possible anterior MI. Code STEMI was called, and the patient was met in the emergency room. She was essentially symptom-free from a chest pressure perspective but remained dyspneic.   She was in atrial fibrillation with a rate in the low 100s, she did note some chest pressure somewhat which was reproducible on exam. She has notable lower chimney edema and wheezing on exam. No obvious rales but does note orthopnea.  Patient s/p procedure 2/26: CORONARY ARTERY BYPASS GRAFTING x 3 EVH -- RIGHT THIGH  Patient discussed in ICU rounds today.  Patient with decreased appetite.  Stated "I don't care much for eggs".  PO intake variable at 0-50% per flowsheet records.  Would benefit from addition of nutrition supplements.  Likes vanilla flavored Ensure  --  RD to order.  Height: Ht Readings from Last 1 Encounters:  10/21/13 5' 1.81" (1.57 m)    Weight: Wt Readings from Last 1 Encounters:  10/27/13 275 lb 9.2 oz (125 kg)    Ideal Body Weight: 105 lb  % Ideal Body Weight: 261%  Wt Readings from Last 10 Encounters:  10/27/13 275 lb 9.2 oz (125 kg)  10/27/13 275 lb 9.2 oz (125 kg)  10/27/13 275 lb 9.2 oz (125 kg)  12/25/11 261 lb (118.389 kg)  12/11/11 261 lb 4.8 oz (118.525 kg)    Usual Body Weight: 261 lb  % Usual Body Weight: 105%  BMI:  Body mass index is 50.71 kg/(m^2).  Estimated Nutritional Needs: Kcal: 1600-1800 Protein: 90-100 gm Fluid: 1.6-1.8 L  Skin: Intact  Diet Order: Dysphagia 1, thin liquids  EDUCATION NEEDS: -No education needs identified at this time   Intake/Output Summary (Last 24 hours) at 10/27/13 1124 Last data filed at 10/27/13 0800  Gross per 24 hour  Intake 980.05 ml  Output   1710 ml  Net -729.95 ml    Labs:   Recent Labs Lab 10/23/13 1915  10/24/13 0405 10/24/13 1600  10/25/13 0330 10/26/13 0330 10/27/13 0349  NA  --   < > 137  --   < > 133* 133* 130*  K  --   < > 4.2  --   < > 4.2 4.4 4.3  CL  --   < > 102  --   < > 98 96 97  CO2  --   --  21  --   --  _0 BUN  --   < > 28*  --   < > 28* 30* 31*  CREATININE 1.60*  < > 1.60* 1.69*  < > 1.67* 1.50* 1.21*  CALCIUM  --   --  8.7  --   --  8.7 8.6 8.5  MG 3.0*  --  2.7* 2.6*  --   --   --   --   GLUCOSE  --   < > 127*  --   < > 160* 138* 91  < > = values in this interval not displayed.  CBG (last 3)   Recent Labs  10/27/13 0346 10/27/13 0722 10/27/13 0743  GLUCAP 96 60* 74    Scheduled Meds: . acetaminophen  1,000 mg Oral 4 times per day  . amiodarone  400 mg Oral BID  . aspirin EC  325 mg Oral Daily  . bisacodyl  10 mg Oral Daily   Or  . bisacodyl  10 mg Rectal Daily  . docusate sodium  200 mg Oral Daily  . enoxaparin (LOVENOX) injection  30 mg Subcutaneous QHS  . insulin aspart  0-24 Units  Subcutaneous 6 times per day  . insulin detemir  20 Units Subcutaneous QHS  . levothyroxine  112 mcg Oral QAC breakfast  . pantoprazole  40 mg Oral Daily  . prednisoLONE acetate  1 drop Right Eye Daily  . sodium chloride  3 mL Intravenous Q12H    Continuous Infusions: . sodium chloride 20 mL/hr (10/27/13 0520)  . DOPamine 3 mcg/kg/min (10/27/13 0800)    Past Medical History  Diagnosis Date  . Arthritis   . Diabetes mellitus   . Heart murmur   . Hyperlipidemia   . Hypertension   . Hypothyroidism   . NSTEMI (non-ST elevated myocardial infarction) 10/20/2013  . Ischemic cardiomyopathy 10/20/2013  . Acute pulmonary edema with congestive heart failure 10/20/2013  . Left bundle branch block (LBBB) on electrocardiogram   . Atrial fibrillation 10/20/2013  . OSTEOARTHRITIS 08/06/2006  . Morbid obesity   . Diabetic neuropathy   . Type II diabetes mellitus with complication 54/65/0354  . Chronic kidney disease   . S/P CABG x 3 10/23/2013    LIMA to LAD, SVG to OM2, SVG to RPLB, EVH via right thigh  . Acute myocardial infarction of anterior wall 10/20/2013    Past Surgical History  Procedure Laterality Date  . Corneal transplant  2011    right eye  . Total hip arthroplasty  2010, 2011    right 2010, left 2011  . Abdominal hysterectomy    . Coronary artery bypass graft N/A 10/23/2013    Procedure: CORONARY ARTERY BYPASS GRAFTING (CABG);  Surgeon: Rexene Alberts, MD;  Location: [facility];  Service: Open Heart Surgery;  Laterality: N/A;  . Intraoperative transesophageal echocardiogram N/A 10/23/2013    Procedure: INTRAOPERATIVE TRANSESOPHAGEAL ECHOCARDIOGRAM;  Surgeon: Rexene Alberts, MD;  Location: [facility];  Service: Open Heart Surgery;  Laterality: N/A;  . Clipping of atrial appendage N/A 10/23/2013    Procedure: CLIPPING OF ATRIAL APPENDAGE;  Surgeon: Rexene Alberts, MD;  Location: [facility];  Service: Open Heart Surgery;  Laterality: N/A;    Arthur Holms, RD, LDN Pager #:  [phone] After-Hours Pager #: [phone]

## 2013-10-27 NOTE — Progress Notes (Signed)
Patient ID: Elaine [name], female   DOB: 20-Aug-1941, 73 y.o.   MRN: [id number] EVENING ROUNDS NOTE :     [address]       [place] [address]             [phone]                 4 Days Post-Op Procedure(s) (LRB): CORONARY ARTERY BYPASS GRAFTING (CABG) (N/A) INTRAOPERATIVE TRANSESOPHAGEAL ECHOCARDIOGRAM (N/A) CLIPPING OF ATRIAL APPENDAGE (N/A)  Total Length of Stay:  LOS: 7 days  BP 117/67  Pulse 72  Temp(Src) 98.4 F (36.9 C) (Oral)  Resp 25  Ht 5' 1.81" (1.57 m)  Wt 275 lb 9.2 oz (125 kg)  BMI 50.71 kg/m2  SpO2 95%  .Intake/Output     03/02 0701 - 03/03 0700   P.O. 520   I.V. (mL/kg) 262.4 (2.1)   Total Intake(mL/kg) 782.4 (6.3)   Urine (mL/kg/hr) 655 (0.4)   Chest Tube    Total Output 655   Net +127.4       Stool Occurrence 1 x     . sodium chloride 20 mL/hr (10/27/13 0520)  . DOPamine 0.5 mcg/kg/min (10/27/13 1800)     Lab Results  Component Value Date   WBC 10.3 10/27/2013   HGB 9.2* 10/27/2013   HCT 26.4* 10/27/2013   PLT 173 10/27/2013   GLUCOSE 91 10/27/2013   CHOL 147 10/21/2013   TRIG 69 10/21/2013   HDL 83 10/21/2013   LDLCALC 50 10/21/2013   ALT 45* 10/21/2013   AST 216* 10/21/2013   NA 130* 10/27/2013   K 4.3 10/27/2013   CL 97 10/27/2013   CREATININE 1.21* 10/27/2013   BUN 31* 10/27/2013   CO2 22 10/27/2013   TSH 2.351 10/20/2013   INR 1.27 10/23/2013   HGBA1C 5.7* 10/20/2013   Still on daopamine CR 1.2   Grace Isaac MD  Beeper [phone] Office [phone] 10/27/2013 7:24 PM

## 2013-10-27 NOTE — Progress Notes (Signed)
Subjective:  Day 4 s/p CABG x 3  Objective:   Vital Signs in the last 24 hours: Temp:  [97.4 F (36.3 C)-98.2 F (36.8 C)] 98.2 F (36.8 C) (03/02 1111) Pulse Rate:  [61-92] 70 (03/02 1215) Resp:  [10-24] 15 (03/02 1215) BP: (68-118)/(33-79) 110/71 mmHg (03/02 1215) SpO2:  [88 %-100 %] 99 % (03/02 1215) Weight:  [275 lb 9.2 oz (125 kg)] 275 lb 9.2 oz (125 kg) (03/02 0600)  Intake/Output from previous day: 03/01 0701 - 03/02 0700 In: 1265.8 [P.O.:480; I.V.:785.8] Out: 1945 [Urine:1835; Chest Tube:110]  Medications: . acetaminophen  1,000 mg Oral 4 times per day  . amiodarone  400 mg Oral BID  . aspirin EC  325 mg Oral Daily  . bisacodyl  10 mg Oral Daily   Or  . bisacodyl  10 mg Rectal Daily  . docusate sodium  200 mg Oral Daily  . enoxaparin (LOVENOX) injection  30 mg Subcutaneous QHS  . feeding supplement (ENSURE COMPLETE)  237 mL Oral BID BM  . insulin aspart  0-24 Units Subcutaneous 6 times per day  . insulin detemir  20 Units Subcutaneous QHS  . levothyroxine  112 mcg Oral QAC breakfast  . pantoprazole  40 mg Oral Daily  . prednisoLONE acetate  1 drop Right Eye Daily  . sodium chloride  3 mL Intravenous Q12H    . sodium chloride 20 mL/hr (10/27/13 0520)  . DOPamine 2 mcg/kg/min (10/27/13 1100)    Physical Exam:   General appearance: alert, cooperative and no distress Neck: no carotid bruit, no JVD, supple, symmetrical, trachea midline and thyroid not enlarged, symmetric, no tenderness/mass/nodules Lungs: no wheezing; decreased BS at bases Heart: regular rate and rhythm and 1/6 sem Abdomen: soft, non-tender; bowel sounds normal; no masses,  no organomegaly Extremities: trace -1+ LE edema Skin: Skin color, texture, turgor normal. No rashes or lesions Neurologic: Grossly normal   Rate: 71  Rhythm: normal sinus rhythm  Lab Results:    Recent Labs  10/26/13 0330 10/27/13 0349  NA 133* 130*  K 4.4 4.3  CL 96 97  CO2 21 22  GLUCOSE 138* 91  BUN  30* 31*  CREATININE 1.50* 1.21*   No results found for this basename: TROPONINI, CK, MB,  in the last 72 hours Hepatic Function Panel No results found for this basename: PROT, ALBUMIN, AST, ALT, ALKPHOS, BILITOT, BILIDIR, IBILI,  in the last 72 hours No results found for this basename: INR,  in the last 72 hours BNP (last 3 results)  Recent Labs  10/20/13 1632 10/21/13 0307 10/22/13 0229  PROBNP 3566.0* 12597.0* 13837.0*    Lipid Panel     Component Value Date/Time   CHOL 147 10/21/2013 0307   TRIG 69 10/21/2013 0307   HDL 83 10/21/2013 0307   CHOLHDL 1.8 10/21/2013 0307   VLDL 14 10/21/2013 0307   LDLCALC 50 10/21/2013 0307      Imaging:  Dg Chest Port 1 View  10/27/2013   CLINICAL DATA:  Postop day 4, status post CABG.  EXAM: PORTABLE CHEST - 1 VIEW  COMPARISON:  DG CHEST 1V PORT dated 10/26/2013; DG CHEST 1V PORT dated 10/25/2013  FINDINGS: Left chest tube and mediastinal drain removed. No pneumothorax identified.  Left IJ introducer sheath remains in place. Prior CABG noted with atrial appendage clip in place.  Continued obscuration of the left hemidiaphragm. Hazy density at both lung bases favoring small pleural effusions. Mild cardiomegaly without edema. There is a small amount of  atelectasis along the left chest tube tract.  IMPRESSION: 1. Left chest tube and mediastinal drain a been removed. No pneumothorax or complicating feature.   Electronically Signed   By: Sherryl Barters M.D.   On: 10/27/2013 07:30   Dg Chest Port 1 View  10/26/2013   CLINICAL DATA:  Post open heart surgery with chest tubes in place  EXAM: PORTABLE CHEST - 1 VIEW  COMPARISON:  DG CHEST 1V PORT dated 10/25/2013; DG CHEST 1V PORT dated 10/23/2013  FINDINGS: Grossly unchanged enlarged cardiac silhouette and mediastinal contours post median sternotomy and CABG. Stable positioning of support apparatus. No pneumothorax. The pulmonary vasculature remains indistinct with cephalization of flow. Unchanged small bilateral  effusions and associated bibasilar opacities, left greater than right. No new focal airspace opacities. Unchanged bones.  IMPRESSION: 1.  Stable positioning of support apparatus.  No pneumothorax. 2. Grossly unchanged findings of pulmonary edema, small amount effusions and associated bibasilar opacities, left greater than right, likely atelectasis.   Electronically Signed   By: Sandi Mariscal M.D.   On: 10/26/2013 11:30      Assessment/Plan:   Principal Problem:   S/P CABG x 3 Active Problems:   Type II diabetes mellitus with complication   Obesity   HYPERTENSION   Atrial fibrillation   Left bundle branch block (LBBB) on electrocardiogram   Acute pulmonary edema with congestive heart failure   Left main coronary artery disease   Acute myocardial infarction of anterior wall   Ischemic cardiomyopathy   Morbid obesity   Chronic kidney disease  Day 4 s/p CABG x 3 with LIAM-LAD, SVG-OM, SVG-PLB. Stable rhythm and BP. Still on low dose dopamine being weaned as tolerated. Wt increased 25 lbs from admission if admission weight is accurate. Wt stable over the past 4 days.I/O [phone] since admission. Renal function improved.    Troy Sine, MD, [facility] 10/27/2013, 2:15 PM

## 2013-10-27 NOTE — Progress Notes (Addendum)
Patient has not voided since foley removal at 1400 today. She states at times she has felt she needed to go, and was placed on bedside commode and has been unsuccessful at voiding. Currently does not feel need to void. Bladder scan shows 112cc in bladder. Will continue to closely monitor. Richarda Blade RN

## 2013-10-27 NOTE — Progress Notes (Signed)
Pt oob to chair. Pt with complaints of gas pain, pt burping and pt had large BM this shift, no nausea. Pt denies chest/incisional pain. Pt declines to ambulate at this time, pt educated on need/importance to ambulate. Will continue to monitor. Reather Laurence

## 2013-10-28 DIAGNOSIS — I2589 Other forms of chronic ischemic heart disease: Secondary | ICD-10-CM

## 2013-10-28 DIAGNOSIS — I447 Left bundle-branch block, unspecified: Secondary | ICD-10-CM

## 2013-10-28 LAB — GLUCOSE, CAPILLARY
Glucose-Capillary: 64 mg/dL — ABNORMAL LOW (ref 70–99)
Glucose-Capillary: 76 mg/dL (ref 70–99)
Glucose-Capillary: 167 mg/dL — ABNORMAL HIGH (ref 70–99)
Glucose-Capillary: 148 mg/dL — ABNORMAL HIGH (ref 70–99)
Glucose-Capillary: 142 mg/dL — ABNORMAL HIGH (ref 70–99)

## 2013-10-28 LAB — BASIC METABOLIC PANEL
BUN: 29 mg/dL — ABNORMAL HIGH (ref 6–23)
CO2: 22 mEq/L (ref 19–32)
CREATININE: 1.38 mg/dL — AB (ref 0.50–1.10)
Calcium: 8.5 mg/dL (ref 8.4–10.5)
Chloride: 102 mEq/L (ref 96–112)
GFR calc non Af Amer: 37 mL/min — ABNORMAL LOW (ref >90)
GFR, EST AFRICAN AMERICAN: 43 mL/min — AB (ref >90)
Glucose, Bld: 68 mg/dL — ABNORMAL LOW (ref 70–99)
Potassium: 4.4 mEq/L (ref 3.7–5.3)
SODIUM: 137 meq/L (ref 137–147)

## 2013-10-28 MED ORDER — FUROSEMIDE 40 MG PO TABS
40.0000 mg | ORAL_TABLET | Freq: Two times a day (BID) | ORAL | Status: DC
  Administered 2013-10-28 (×2): 40 mg via ORAL
  Filled 2013-10-28 (×6): qty 1

## 2013-10-28 MED ORDER — WARFARIN - PHYSICIAN DOSING INPATIENT
Freq: Every day | Status: DC
  Administered 2013-11-03 – 2013-11-04 (×2)

## 2013-10-28 MED ORDER — BOOST / RESOURCE BREEZE PO LIQD
1.0000 | Freq: Three times a day (TID) | ORAL | Status: DC
  Administered 2013-10-28 – 2013-11-05 (×17): 1 via ORAL

## 2013-10-28 MED ORDER — METOPROLOL TARTRATE 12.5 MG HALF TABLET
12.5000 mg | ORAL_TABLET | Freq: Two times a day (BID) | ORAL | Status: DC
  Administered 2013-10-29: 12.5 mg via ORAL
  Filled 2013-10-28 (×2): qty 1

## 2013-10-28 MED ORDER — MOVING RIGHT ALONG BOOK
Freq: Once | Status: AC
  Administered 2013-10-28: 09:00:00
  Filled 2013-10-28: qty 1

## 2013-10-28 MED ORDER — WARFARIN SODIUM 2.5 MG PO TABS
2.5000 mg | ORAL_TABLET | Freq: Every day | ORAL | Status: DC
  Administered 2013-10-28 – 2013-10-29 (×2): 2.5 mg via ORAL
  Filled 2013-10-28 (×4): qty 1

## 2013-10-28 MED ORDER — SODIUM CHLORIDE 0.9 % IJ SOLN: 3.0000 mL | INTRAMUSCULAR | Status: DC | PRN

## 2013-10-28 MED ORDER — TRAMADOL HCL 50 MG PO TABS
50.0000 mg | ORAL_TABLET | ORAL | Status: DC | PRN
  Administered 2013-10-28 – 2013-11-05 (×4): 50 mg via ORAL
  Filled 2013-10-28 (×4): qty 1

## 2013-10-28 MED ORDER — SODIUM CHLORIDE 0.9 % IV SOLN: 250.0000 mL | INTRAVENOUS | Status: DC | PRN

## 2013-10-28 MED ORDER — AMIODARONE HCL 200 MG PO TABS
200.0000 mg | ORAL_TABLET | Freq: Two times a day (BID) | ORAL | Status: DC
  Administered 2013-10-28 – 2013-10-29 (×3): 200 mg via ORAL
  Filled 2013-10-28 (×5): qty 1

## 2013-10-28 MED ORDER — LEVALBUTEROL HCL 0.63 MG/3ML IN NEBU: 0.6300 mg | INHALATION_SOLUTION | Freq: Four times a day (QID) | RESPIRATORY_TRACT | Status: DC | PRN

## 2013-10-28 MED ORDER — METOPROLOL TARTRATE 12.5 MG HALF TABLET: 12.5000 mg | ORAL_TABLET | Freq: Two times a day (BID) | ORAL | Status: DC

## 2013-10-28 MED ORDER — INSULIN ASPART 100 UNIT/ML ~~LOC~~ SOLN
0.0000 [IU] | Freq: Three times a day (TID) | SUBCUTANEOUS | Status: DC
  Administered 2013-10-28: 2 [IU] via SUBCUTANEOUS
  Administered 2013-10-28: 4 [IU] via SUBCUTANEOUS

## 2013-10-28 MED ORDER — POTASSIUM CHLORIDE CRYS ER 20 MEQ PO TBCR
20.0000 meq | EXTENDED_RELEASE_TABLET | Freq: Every day | ORAL | Status: DC
  Administered 2013-10-29 – 2013-11-05 (×8): 20 meq via ORAL
  Filled 2013-10-28 (×8): qty 1

## 2013-10-28 MED ORDER — SODIUM CHLORIDE 0.9 % IJ SOLN
3.0000 mL | Freq: Two times a day (BID) | INTRAMUSCULAR | Status: DC
  Administered 2013-10-30 – 2013-11-04 (×3): 3 mL via INTRAVENOUS

## 2013-10-28 MED ORDER — MAGNESIUM HYDROXIDE 400 MG/5ML PO SUSP: 30.0000 mL | Freq: Every day | ORAL | Status: DC | PRN

## 2013-10-28 MED ORDER — ATORVASTATIN CALCIUM 20 MG PO TABS
20.0000 mg | ORAL_TABLET | Freq: Every day | ORAL | Status: DC
  Administered 2013-10-28 – 2013-11-04 (×8): 20 mg via ORAL
  Filled 2013-10-28 (×10): qty 1

## 2013-10-28 NOTE — Progress Notes (Signed)
Inpatient Diabetes Program Recommendations  AACE/ADA: New Consensus Statement on Inpatient Glycemic Control (2013)  Target Ranges:  Prepandial:   less than 140 mg/dL      Peak postprandial:   less than 180 mg/dL (1-2 hours)      Critically ill patients:  140 - 180 mg/dL   Reason for Assessment: Results for Elaine, [name] (MRN [id number]) as of 10/28/2013 10:27  Ref. Range 10/27/2013 15:36 10/27/2013 19:40 10/27/2013 22:19 10/28/2013 07:29 10/28/2013 08:00  Glucose-Capillary Latest Range: 70-99 mg/dL 132 (H) 198 (H) 175 (H) 64 (L) 76   Note CBG less than 70 mg/dL this AM. Consider further reduction of Levemir to 10 units q HS.     Thanks, Adah Perl, RN, BC-ADM Inpatient Diabetes Coordinator Pager [phone]

## 2013-10-28 NOTE — Clinical Documentation Improvement (Signed)
Possible Clinical Conditions?   CKD Stage III - GFR 30-59 CKD Stage IV - GFR 15-29 CKD Stage V - GFR < 15 Other condition Cannot Clinically determine    Risk Factors:  CKD, renal function stable, noted per 3/02 progress notes.  Diagnostics:  Bun:   3/02: 31                  3/01: 30                  02/28: 28  Creat: 3/02: 1.21              3/01: 1.50                02/28: 1.67  GFR:  3/02: 50.                 3/01:  39                  02/28: 34   Thank You, Jeannetta Ellis ,RN Clinical Documentation Specialist:  [place] [employer] [facility]

## 2013-10-28 NOTE — Addendum Note (Signed)
Addendum created 10/28/13 1340 by Roberts Gaudy, MD   Modules edited: Anesthesia Attestations, BPA Follow-up Actions, Notes Section   Notes Section:  File: [id number]

## 2013-10-28 NOTE — Anesthesia Postprocedure Evaluation (Signed)
  Anesthesia Post-op Note  Patient: [name]  Procedure(s) Performed: Procedure(s): CORONARY ARTERY BYPASS GRAFTING (CABG) (N/A) INTRAOPERATIVE TRANSESOPHAGEAL ECHOCARDIOGRAM (N/A) CLIPPING OF ATRIAL APPENDAGE (N/A)  Patient Location: SICU  Anesthesia Type:General  Level of Consciousness: awake, alert  and oriented  Airway and Oxygen Therapy: Patient Spontanous Breathing and Patient connected to nasal cannula oxygen  Post-op Pain: mild  Post-op Assessment: Post-op Vital signs reviewed, Patient's Cardiovascular Status Stable, Respiratory Function Stable, Patent Airway, Adequate PO intake and Pain level controlled  Post-op Vital Signs: stable  Complications: No apparent anesthesia complications

## 2013-10-28 NOTE — Progress Notes (Signed)
      [address]       [place] [address]             [phone]        CARDIOTHORACIC SURGERY PROGRESS NOTE   R5 Days Post-Op Procedure(s) (LRB): CORONARY ARTERY BYPASS GRAFTING (CABG) (N/A) INTRAOPERATIVE TRANSESOPHAGEAL ECHOCARDIOGRAM (N/A) CLIPPING OF ATRIAL APPENDAGE (N/A)  Subjective: Feels okay.  Reluctant to attempt to ambulate.  Marginal appetite.  Mild dyspnea and soreness in chest but overall improved.  Objective: Vital signs: BP Readings from Last 1 Encounters:  10/28/13 140/58   Pulse Readings from Last 1 Encounters:  10/28/13 70   Resp Readings from Last 1 Encounters:  10/28/13 18   Temp Readings from Last 1 Encounters:  10/28/13 98.1 F (36.7 C) Oral    Hemodynamics:    Physical Exam:  Rhythm:   sinus  Breath sounds: Diminished at bases  Heart sounds:  RRR  Incisions:  Clean and dry  Abdomen:  Soft, non-distended, non-tender  Extremities:  Warm, well-perfused    Intake/Output from previous day: 03/02 0701 - 03/03 0700 In: 1188.7 [P.O.:640; I.V.:548.7] Out: 655 [Urine:655] Intake/Output this shift: Total I/O In: 20 [I.V.:20] Out: -   Lab Results:  CBC: Recent Labs  10/26/13 0330 10/27/13 0349  WBC 12.7* 10.3  HGB 9.1* 9.2*  HCT 26.0* 26.4*  PLT 158 173    BMET:  Recent Labs  10/27/13 0349 10/28/13 0440  NA 130* 137  K 4.3 4.4  CL 97 102  CO2 22 22  GLUCOSE 91 68*  BUN 31* 29*  CREATININE 1.21* 1.38*  CALCIUM 8.5 8.5     CBG (last 3)   Recent Labs  10/27/13 2219 10/28/13 0729 10/28/13 0800  GLUCAP 175* 64* 76    ABG    Component Value Date/Time   PHART 7.351 10/24/2013 0352   PCO2ART 42.7 10/24/2013 0352   PO2ART 74.0* 10/24/2013 0352   HCO3 23.7 10/24/2013 0352   TCO2 21 10/24/2013 1630   ACIDBASEDEF 2.0 10/24/2013 0352   O2SAT 70.4 10/25/2013 1725    CXR: n/a  Assessment/Plan: S/P Procedure(s) (LRB): CORONARY ARTERY BYPASS GRAFTING (CABG) (N/A) INTRAOPERATIVE TRANSESOPHAGEAL  ECHOCARDIOGRAM (N/A) CLIPPING OF ATRIAL APPENDAGE (N/A)  Overall stable POD5 Maintaining NSR w/ stable BP off all drips Pre-op acute anterior MI w/ severe LV dysfunction Acute exacerbation of chronic diastolic CHF and acute systolic CHF new since MI Expected post op acute blood loss anemia, mild, stable Expected post op atelectasis, mild Expected post op volume excess, mild, diuresing some spontaneously Chronic kidney disease, renal function stable Type II diabetes mellitus, some hypoglycemia in early am but improved Morbid obesity     Mobilize  Continue PT  Stop HS sliding scale coverag  Restart low dose beta blocker and decrease amiodarone dose  Start oral lasix  Restart ACE-I in 1-2 days if BP continues to improve  Start low dose coumadin because of PAF both pre- and post-op  Transfer step-down  [name],[name] 10/28/2013 8:31 AM

## 2013-10-28 NOTE — Progress Notes (Signed)
Pt transferred to 2W22 via wheelchair. Pt family present for transfer. Pt transferred on portable tele, room air. Receiving RN present on arrival to unit, pt placed on receiving units tele box. Pt belongings sent with patient to new room. Will continue to monitor. Reather Laurence

## 2013-10-28 NOTE — Progress Notes (Signed)
Physical Therapy Treatment Patient Details Name: Elaine [name] MRN: [id number] DOB: 11-04-1940 Today's Date: 10/28/2013    History of Present Illness 73 y.o. adm 10/20/13 with several weeks of ongoing/persistent chest pressure and now shortness of breath and edema. Upon EMS arrival EKG showed atrial fibrillation with a left bundle branch block. Code STEMI was called. 2/26 underwent CABG x 3 with clipping of Lt atrial appendage. PMH of HTN, DM-2, obesity & former smoker    PT Comments    Discussed need to push her walking, especially if she plans to go stay with her daughter with 6 steps to go up to get into her apartment. She is hopeful she will be able to do this, and recognizes she may need incr time with therapies prior to discharging home to her daughter's. Feel inpatient rehab may be a good option as she has very good family support. Will continue to follow.   Follow Up Recommendations  CIR;Supervision for mobility/OOB     Equipment Recommendations  None recommended by PT    Recommendations for Other Services Rehab consult;OT consult  Precautions / Restrictions Precautions Precautions: Sternal;Fall Restrictions Other Position/Activity Restrictions: sternal precautions; pt able to state "no pushing" only    Home Living                       Prior Function              Pertinent Vitals/Pain        SaO2 at rest on RA 100%       Monitor would not register while walking; dyspnea 3/4   Mobility  Bed Mobility Overal bed mobility: Needs Assistance Bed Mobility: Rolling;Sidelying to Sit Rolling: Min assist Sidelying to sit: Mod assist;HOB elevated       General bed mobility comments: pt cued to use head to assist with rolling and initiating side to sit with pt doing well with these cues; assist to move legs over EOB and raise torso  Transfers Overall transfer level: Needs assistance Equipment used: Rolling walker (2 wheeled) Transfers: Sit to/from Stand Sit to  Stand: Min assist         General transfer comment: assist to come forward over her feet; x 3 from various surfaces  Ambulation/Gait Ambulation/Gait assistance: Min guard;+2 safety/equipment Ambulation Distance (Feet): 86 Feet (74, seated rest, 12) Assistive device: Rolling walker (2 wheeled) Gait Pattern/deviations: Step-through pattern;Decreased stride length   Gait velocity interpretation: Below normal speed for age/gender General Gait Details: several standing rest breaks; SaO2 monitor not picking up while walking; pt dyspnea 3/4; on 2nd walk used 2L O2 and pt felt much less SOB                 Balance                                    Exercises        Cognition Arousal/Alertness: Awake/alert Behavior During Therapy: WFL for tasks assessed/performed Overall Cognitive Status: Within Functional Limits for tasks assessed                      General Comments General comments (skin integrity, edema, etc.): Discussed need to push her walking, especially if she plans to go stay with her daughter with 6 steps to go up to get into her apartment.    PT Goals (current goals can now  be found in the care plan section) Acute Rehab PT Goals Patient Stated Goal: eventually return home to care for her husband with mild dementia  Frequency  Min 3X/week    PT Plan Discharge plan needs to be updated    End of Session Equipment Utilized During Treatment: Gait belt;Oxygen Activity Tolerance: Patient limited by fatigue Patient left: in chair;with call bell/phone within reach;with nursing/sitter in room     Time: [id number] PT Time Calculation (min): 37 min  Charges:  $Gait Training: 23-37 mins                    G Codes:      Delyla Sandeen November 04, 2013, 12:02 PM Pager [phone]

## 2013-10-28 NOTE — Progress Notes (Signed)
CRITICAL VALUE ALERT  Critical value received:  CBG  Date of notification:  10/28/13  Time of notification:  ~0730  Critical value read back: yes  Nurse who received alert:  Reather Laurence RN  MD notified (1st page):  N/a pt given 186mL orange juice and will re check  Time of first page:  n/a  MD notified (2nd page): n/a  Time of second page: recheck cbg 74  Responding MD:    Time MD responded:

## 2013-10-28 NOTE — Progress Notes (Signed)
Rehab Admissions Coordinator Note:  Patient was screened by Retta Diones for appropriateness for an Inpatient Acute Rehab Consult.  At this time, we are recommending Inpatient Rehab consult. Will also need OT evaluation ordered.  [facility] and we will need authorization if inpatient rehab is desired.  Jodell Cipro M 10/28/2013, 3:06 PM  I can be reached at [phone].

## 2013-10-28 NOTE — Progress Notes (Signed)
Dr Roxy Manns updated IV team attempted x5 to place PIV, not successful. Left IJ sleeve still in place, pt now in 2W22. Reather Laurence

## 2013-10-28 NOTE — Progress Notes (Signed)
IV team RN x2 attempted PIV stick and were unable to place new PIV. [name]. Will continue with transfer as ordered per Dr Roxy Manns and will notify MD with next rounds unable to place PIV, left IJ sleeve will stay in place for now to maintain IV access. Elaine [name]

## 2013-10-29 ENCOUNTER — Inpatient Hospital Stay (HOSPITAL_COMMUNITY): Payer: Medicare HMO

## 2013-10-29 DIAGNOSIS — Z951 Presence of aortocoronary bypass graft: Secondary | ICD-10-CM

## 2013-10-29 LAB — GLUCOSE, CAPILLARY
Glucose-Capillary: 111 mg/dL — ABNORMAL HIGH (ref 70–99)
Glucose-Capillary: 243 mg/dL — ABNORMAL HIGH (ref 70–99)

## 2013-10-29 LAB — BASIC METABOLIC PANEL
BUN: 25 mg/dL — ABNORMAL HIGH (ref 6–23)
CALCIUM: 8 mg/dL — AB (ref 8.4–10.5)
CO2: 23 mEq/L (ref 19–32)
Chloride: 104 mEq/L (ref 96–112)
Creatinine, Ser: 1.39 mg/dL — ABNORMAL HIGH (ref 0.50–1.10)
GFR calc Af Amer: 42 mL/min — ABNORMAL LOW (ref >90)
GFR, EST NON AFRICAN AMERICAN: 37 mL/min — AB (ref >90)
Glucose, Bld: 111 mg/dL — ABNORMAL HIGH (ref 70–99)
Potassium: 4.3 mEq/L (ref 3.7–5.3)
Sodium: 138 mEq/L (ref 137–147)

## 2013-10-29 LAB — CBC
HCT: 24.9 % — ABNORMAL LOW (ref 36.0–46.0)
HEMOGLOBIN: 8.4 g/dL — AB (ref 12.0–15.0)
MCH: 27.5 pg (ref 26.0–34.0)
MCHC: 33.7 g/dL (ref 30.0–36.0)
MCV: 81.6 fL (ref 78.0–100.0)
Platelets: 279 10*3/uL (ref 150–400)
RBC: 3.05 MIL/uL — ABNORMAL LOW (ref 3.87–5.11)
RDW: 16 % — ABNORMAL HIGH (ref 11.5–15.5)
WBC: 8.3 10*3/uL (ref 4.0–10.5)

## 2013-10-29 LAB — PROTIME-INR
INR: 1.16 (ref 0.00–1.49)
Prothrombin Time: 14.6 seconds (ref 11.6–15.2)

## 2013-10-29 MED ORDER — SODIUM CHLORIDE 0.9 % IJ SOLN
10.0000 mL | INTRAMUSCULAR | Status: DC | PRN
Start: 2013-10-29 — End: 2013-11-05
  Administered 2013-10-30 – 2013-11-05 (×4): 10 mL

## 2013-10-29 MED ORDER — AMIODARONE IV BOLUS ONLY 150 MG/100ML
150.0000 mg | Freq: Once | INTRAVENOUS | Status: AC
  Administered 2013-10-29: 150 mg via INTRAVENOUS
  Filled 2013-10-29 (×2): qty 100

## 2013-10-29 MED ORDER — FUROSEMIDE 10 MG/ML IJ SOLN
40.0000 mg | Freq: Two times a day (BID) | INTRAMUSCULAR | Status: AC
  Administered 2013-10-29 (×2): 40 mg via INTRAVENOUS

## 2013-10-29 MED ORDER — METOPROLOL TARTRATE 25 MG PO TABS
25.0000 mg | ORAL_TABLET | Freq: Two times a day (BID) | ORAL | Status: DC
  Administered 2013-10-29 – 2013-11-04 (×11): 25 mg via ORAL
  Filled 2013-10-29 (×13): qty 1

## 2013-10-29 MED ORDER — GUAIFENESIN ER 600 MG PO TB12
600.0000 mg | ORAL_TABLET | Freq: Two times a day (BID) | ORAL | Status: DC
  Administered 2013-10-29 – 2013-11-05 (×15): 600 mg via ORAL
  Filled 2013-10-29 (×17): qty 1

## 2013-10-29 MED ORDER — AMIODARONE HCL 200 MG PO TABS: 400.0000 mg | ORAL_TABLET | Freq: Two times a day (BID) | ORAL | Status: DC

## 2013-10-29 MED ORDER — FUROSEMIDE 40 MG PO TABS
40.0000 mg | ORAL_TABLET | Freq: Two times a day (BID) | ORAL | Status: DC
  Administered 2013-10-30: 40 mg via ORAL
  Filled 2013-10-29 (×3): qty 1

## 2013-10-29 MED ORDER — AMIODARONE HCL 200 MG PO TABS
400.0000 mg | ORAL_TABLET | Freq: Once | ORAL | Status: AC
  Administered 2013-10-29: 400 mg via ORAL
  Filled 2013-10-29: qty 2

## 2013-10-29 MED ORDER — FERROUS SULFATE 325 (65 FE) MG PO TABS
325.0000 mg | ORAL_TABLET | Freq: Every day | ORAL | Status: DC
  Administered 2013-10-30 – 2013-11-05 (×7): 325 mg via ORAL
  Filled 2013-10-29 (×9): qty 1

## 2013-10-29 MED ORDER — AMIODARONE HCL 200 MG PO TABS
400.0000 mg | ORAL_TABLET | Freq: Two times a day (BID) | ORAL | Status: DC
  Administered 2013-10-30 – 2013-11-02 (×8): 400 mg via ORAL
  Filled 2013-10-29 (×10): qty 2

## 2013-10-29 NOTE — Progress Notes (Signed)
Physical Therapy Treatment Patient Details Name: Elaine [name] MRN: [id number] DOB: [birth year] Today's Date: 10/29/2013    History of Present Illness 73 y.o. adm 10/20/13 with several weeks of ongoing/persistent chest pressure and now shortness of breath and edema. Upon EMS arrival EKG showed atrial fibrillation with a left bundle branch block. Code STEMI was called. 2/26 underwent CABG x 3 with clipping of Lt atrial appendage. PMH of HTN, DM-2, obesity & former smoker    PT Comments    On arrival, pt's HR varying from 109 to 126 and pt with dyspnea on 2L O2 (SaO2 97%). RN notified of tachycardia and in to assess pt. Pt's linens soaked with urine and assisted pt to stand and change linens/perform pericare with HR up to 136. At the end of session, reviewed proper use and frequency of use of incentive spirometer. Pt able to achieve 750 ml.    Follow Up Recommendations  CIR;Supervision for mobility/OOB     Equipment Recommendations  None recommended by PT    Recommendations for Other Services Rehab consult;OT consult    Precautions / Restrictions Precautions Precautions: Sternal;Fall Precaution Comments: pt able to state not to use her arms and requesting her pillow to hold during sit to stand Restrictions Weight Bearing Restrictions: No (sternal precautions.)     Home Living                       Prior Function             Pertinent Vitals/Pain See above and flowsheet.  Mobility  Bed Mobility                  Transfers Overall transfer level: Needs assistance Equipment used: Rolling walker (2 wheeled) Transfers: Sit to/from Stand Sit to Stand: Mod assist;+2 physical assistance         General transfer comment: assist to come forward over her feet: pt in lower recliner in her new room requiring more assist  Ambulation/Gait             General Gait Details: Unable due to tachycardia (136 with standing) RN made aware and in to see pt                               Exercises General Exercises - Lower Extremity Ankle Circles/Pumps: AROM;Both;10 reps;Seated      Cognition Arousal/Alertness: Awake/alert Behavior During Therapy: WFL for tasks assessed/performed Overall Cognitive Status: Within Functional Limits for tasks assessed                      General Comments General comments (skin integrity, edema, etc.): Edema in bil legs worse today. Pt sitting on wet linens (soaked with urine). Reports she is incontinent when she coughs and asking if she could have her foley put back in while she is getting lasix. Stood from chair for [employer] and to change linens.    PT Goals (current goals can now be found in the care plan section) Acute Rehab PT Goals Patient Stated Goal: eventually return home to care for her husband with mild dementia Progress towards PT goals: Not progressing toward goals - comment (tachycardia; dyspnea at rest)    Frequency  Min 3X/week    PT Plan Current plan remains appropriate    End of Session Equipment Utilized During Treatment: Oxygen Activity Tolerance: Patient limited by fatigue;Treatment limited secondary to medical complications (Comment) (  incr HR, incr RR) Patient left: in chair;with call bell/phone within reach;with nursing/sitter in room     Time: 0907-0929 PT Time Calculation (min): 22 min  Charges:  $Therapeutic Activity: 8-22 mins                    G Codes:      Elaine [name] 04-Nov-2013, 10:12 AM Pager [phone]

## 2013-10-29 NOTE — Progress Notes (Addendum)
      [address]       [place] [address]             [phone]        6 Days Post-Op Procedure(s) (LRB): CORONARY ARTERY BYPASS GRAFTING (CABG) (N/A) INTRAOPERATIVE TRANSESOPHAGEAL ECHOCARDIOGRAM (N/A) CLIPPING OF ATRIAL APPENDAGE (N/A)  Subjective: Patient sitting in chair eating breakfast. Has shortness of breath, at times, worse with exertion.   Objective: Vital signs in last 24 hours: Temp:  [98.2 F (36.8 C)-98.5 F (36.9 C)] 98.5 F (36.9 C) (03/04 0549) Pulse Rate:  [52-180] 79 (03/04 0549) Cardiac Rhythm:  [-] Normal sinus rhythm (03/03 2300) Resp:  [13-25] 20 (03/04 0549) BP: (109-140)/(43-96) 133/76 mmHg (03/04 0549) SpO2:  [92 %-100 %] 97 % (03/04 0549) Weight:  [123.2 kg (271 lb 9.7 oz)] 123.2 kg (271 lb 9.7 oz) (03/04 0549)  Pre op weight 113 kg Current Weight  10/29/13 123.2 kg (271 lb 9.7 oz)      Intake/Output from previous day: 03/03 0701 - 03/04 0700 In: 580 [P.O.:480; I.V.:100] Out: -    Physical Exam:  Cardiovascular: RRR Pulmonary: Coarse breath sounds; no rales, wheezes, or rhonchi. Abdomen: Soft, non tender, bowel sounds present. Extremities:Bilateral lower extremity edema. Wounds: Clean and dry.  No erythema or signs of infection.  Lab Results: CBC: Recent Labs  10/27/13 0349 10/29/13 0550  WBC 10.3 8.3  HGB 9.2* 8.4*  HCT 26.4* 24.9*  PLT 173 279   BMET:  Recent Labs  10/28/13 0440 10/29/13 0550  NA 137 138  K 4.4 4.3  CL 102 104  CO2 22 23  GLUCOSE 68* 111*  BUN 29* 25*  CREATININE 1.38* 1.39*  CALCIUM 8.5 8.0*    PT/INR:  Lab Results  Component Value Date   INR 1.16 10/29/2013   INR 1.27 10/23/2013   INR 1.01 10/21/2013   ABG:  INR: Will add last result for INR, ABG once components are confirmed Will add last 4 CBG results once components are confirmed  Assessment/Plan:  1. CV - S/p STEMI.SR in the 60-70's. On Lopressor 12.5 bid, Amiodarone 200 bid, and Coumadin. INR 1.6. Monitor as  may need to increase Coumadin dose. 2.  Pulmonary - On 2 liters of oxygen via [place]-wean as tolerates.CXR appears to show no pneumothorax, bilateral pleural effusions and atelectasis, mild cardiomegaly.Encourage incentive spirometer. Mucinex for cough. 3. Volume Overload - On Lasix 40 bid. 4.  Acute blood loss anemia - H and H 8.4 and 24.9. Start Ferrous. 5.CBGs 148/142/111. Pre op HGA1C 5.7. She is likely pre diabetic. Will stop glucose checks and SS PRN. 6. Creatinine remains 1.39. Not on ACE 7. Patient still with left IJ. Has no peripheral access. Will discuss if should leave or obtain PICC. 8.Remove EPW 9. Will discuss with Dr. Roxy Manns arrangements for discharge (when surgically ready). Patient states she has daughter and son to help. May need CIR vs SNF.  ZIMMERMAN,DONIELLE MPA-C 10/29/2013,7:37 AM  I have seen and examined the patient and agree with the assessment and plan as outlined.  Must get old central line out ASAP - will ask for PICC line.  Will give lasix IV today.  Lenah Messenger H 10/29/2013 8:43 AM

## 2013-10-29 NOTE — Consult Note (Signed)
Physical Medicine and Rehabilitation Consult Reason for Consult: Deconditioning/CABG Referring Physician: Dr. Roxy Manns   HPI: Elaine [name] is a 73 y.o. right-handed female with history of hypertension, type 2 diabetes mellitus, obesity and former tobacco abuse. Patient independent prior to admission caring for her husband. Admitted 10/20/2013 with persistent chest pressure and shortness of breath with lower extremity edema. Noted be in atrial fibrillation with rate in the low 100s. There was some ST segment abnormalities concerning for possible STEMI. Cardiac catheterization demonstrates moderate left main coronary artery stenosis with high-grade proximal stenosis of the left anterior descending coronary artery and three-vessel disease. Underwent CABG x3 as well as clipping of left atrial appendage 10/23/2013 per Dr. Roxy Manns. Hospital course orthostasis maintained on dopamine. Bouts of fluid overload with diuresis ongoing. Acute blood loss anemia 8.4 and monitored on iron supplement. Coumadin initiated in relation to atrial fibrillation bypass surgery. Subcutaneous Lovenox for DVT prophylaxis. She is tolerating a mechanical soft diet. Physical therapy evaluation completed 10/26/2013 with sternal precautions. Recommendations have been made for physical medicine rehabilitation consult.   Review of Systems  Respiratory: Positive for shortness of breath.   Cardiovascular: Positive for chest pain.  Gastrointestinal: Positive for constipation.  Musculoskeletal: Positive for joint pain and myalgias.  All other systems reviewed and are negative.   Past Medical History  Diagnosis Date  . Arthritis   . Diabetes mellitus   . Heart murmur   . Hyperlipidemia   . Hypertension   . Hypothyroidism   . NSTEMI (non-ST elevated myocardial infarction) 10/20/2013  . Ischemic cardiomyopathy 10/20/2013  . Acute pulmonary edema with congestive heart failure 10/20/2013  . Left bundle branch block (LBBB) on  electrocardiogram   . Atrial fibrillation 10/20/2013  . OSTEOARTHRITIS 08/06/2006  . Morbid obesity   . Diabetic neuropathy   . Type II diabetes mellitus with complication [id number]  . Chronic kidney disease   . S/P CABG x 3 10/23/2013    LIMA to LAD, SVG to OM2, SVG to RPLB, EVH via right thigh  . Acute myocardial infarction of anterior wall 10/20/2013   Past Surgical History  Procedure Laterality Date  . Corneal transplant  2011    right eye  . Total hip arthroplasty  2010, 2011    right 2010, left 2011  . Abdominal hysterectomy    . Coronary artery bypass graft N/A 10/23/2013    Procedure: CORONARY ARTERY BYPASS GRAFTING (CABG);  Surgeon: Rexene Alberts, MD;  Location: [place];  Service: Open Heart Surgery;  Laterality: N/A;  . Intraoperative transesophageal echocardiogram N/A 10/23/2013    Procedure: INTRAOPERATIVE TRANSESOPHAGEAL ECHOCARDIOGRAM;  Surgeon: Rexene Alberts, MD;  Location: [place];  Service: Open Heart Surgery;  Laterality: N/A;  . Clipping of atrial appendage N/A 10/23/2013    Procedure: CLIPPING OF ATRIAL APPENDAGE;  Surgeon: Rexene Alberts, MD;  Location: [place];  Service: Open Heart Surgery;  Laterality: N/A;   Family History  Problem Relation Age of Onset  . Colon cancer Neg Hx   . Esophageal cancer Neg Hx   . Rectal cancer Neg Hx   . Stomach cancer Neg Hx    Social History:  reports that she quit smoking about 20 years ago. She has never used smokeless tobacco. She reports that she does not drink alcohol or use illicit drugs. Allergies:  Allergies  Allergen Reactions  . Tape Rash    Adhesive tape   Medications Prior to Admission  Medication Dose Route Frequency Provider Last Rate  Last Dose  . 0.9 %  sodium chloride infusion  500 mL Intravenous Continuous Sable Feil, MD       Medications Prior to Admission  Medication Sig Dispense Refill  . amLODipine (NORVASC) 5 MG tablet Take 5 mg by mouth daily.      [name] aspirin EC 81 MG tablet Take 81 mg by mouth  daily.      . benazepril (LOTENSIN) 40 MG tablet Take 40 mg by mouth daily.      . CRESTOR 20 MG tablet Take 10 mg by mouth at bedtime.       . Exenatide (BYDUREON) 2 MG SUSR Inject 2 mg into the skin once a week.       . furosemide (LASIX) 40 MG tablet Take 40 mg by mouth daily.      . Glucosamine-Chondroit-Vit C-Mn (GLUCOSAMINE CHONDR 1500 COMPLX PO) Take 2 tablets by mouth daily.      [name] levothyroxine (SYNTHROID, LEVOTHROID) 112 MCG tablet Take 112 mcg by mouth daily before breakfast.       . metFORMIN (GLUCOPHAGE) 1000 MG tablet 1,000 mg 2 (two) times daily with a meal.       . Multiple Vitamins-Minerals (MULTIVITAMIN PO) Take 1 tablet by mouth daily.      . naproxen sodium (ALEVE) 220 MG tablet Take 220 mg by mouth at bedtime.      . prednisoLONE acetate (PRED FORTE) 1 % ophthalmic suspension Place 1 drop into the right eye daily.        Home: Home Living Family/patient expects to be discharged to:: Private residence Living Arrangements: Spouse/significant other Available Help at Discharge: Family;Available 24 hours/day (plans to stay with her daughter) Type of Home: Apartment Home Access: Stairs to enter [employer] of Steps: 6 Entrance Stairs-Rails: None Home Layout: One level Home Equipment: Cane - single point;Walker - 4 wheels;Bedside commode;Shower seat Additional Comments: spouse has early dementia; grandson & son are caring for him; pt to go stay with her daughter  Functional History: Prior Function Comments: used cane outside; used seat in shower Functional Status:  Mobility:     Ambulation/Gait Ambulation Distance (Feet): 86 Feet (74, seated rest, 12) General Gait Details: Unable due to tachycardia (136 with standing) RN made aware and in to see pt    ADL:    Cognition: Cognition Overall Cognitive Status: Within Functional Limits for tasks assessed Orientation Level: Oriented X4 Cognition Arousal/Alertness: Awake/alert Behavior During Therapy:  WFL for tasks assessed/performed Overall Cognitive Status: Within Functional Limits for tasks assessed  Blood pressure 133/76, pulse 136, temperature 98.5 F (36.9 C), temperature source Oral, resp. rate 20, height 5' 1.81" (1.57 m), weight 123.2 kg (271 lb 9.7 oz), SpO2 97.00%. Physical Exam  Vitals reviewed. Constitutional: She is oriented to person, place, and time. No distress.  obese  HENT:  Head: Normocephalic and atraumatic.  Eyes: EOM are normal.  Right eye vision limited  Neck: Normal range of motion. Neck supple. No JVD present. No tracheal deviation present. No thyromegaly present.  Cardiovascular: Normal rate.   Cardiac rate controlled  Respiratory:  Decreased breath sounds at the bases but clear to auscultation  GI: Soft. Bowel sounds are normal. She exhibits no distension.  Obese  Neurological: She is alert and oriented to person, place, and time.  UE 4/5 prox to distal. LE 2/5 HF, 3- KE and 4/5 ADF/APF.   Skin:   midline chest incision clean and dry. Leg incision intact/tender  Psychiatric: She has a normal mood  and affect. Her behavior is normal. Judgment and thought content normal.    Results for orders placed during the hospital encounter of 10/20/13 (from the past 24 hour(s))  GLUCOSE, CAPILLARY     Status: Abnormal   Collection Time    10/28/13  6:25 PM      Result Value Ref Range   Glucose-Capillary 148 (*) 70 - 99 mg/dL  GLUCOSE, CAPILLARY     Status: Abnormal   Collection Time    10/28/13  9:58 PM      Result Value Ref Range   Glucose-Capillary 142 (*) 70 - 99 mg/dL  CBC     Status: Abnormal   Collection Time    10/29/13  5:50 AM      Result Value Ref Range   WBC 8.3  4.0 - 10.5 K/uL   RBC 3.05 (*) 3.87 - 5.11 MIL/uL   Hemoglobin 8.4 (*) 12.0 - 15.0 g/dL   HCT 24.9 (*) 36.0 - 46.0 %   MCV 81.6  78.0 - 100.0 fL   MCH 27.5  26.0 - 34.0 pg   MCHC 33.7  30.0 - 36.0 g/dL   RDW 16.0 (*) 11.5 - 15.5 %   Platelets 279  150 - 400 K/uL  BASIC METABOLIC  PANEL     Status: Abnormal   Collection Time    10/29/13  5:50 AM      Result Value Ref Range   Sodium 138  137 - 147 mEq/L   Potassium 4.3  3.7 - 5.3 mEq/L   Chloride 104  96 - 112 mEq/L   CO2 23  19 - 32 mEq/L   Glucose, Bld 111 (*) 70 - 99 mg/dL   BUN 25 (*) 6 - 23 mg/dL   Creatinine, Ser 1.39 (*) 0.50 - 1.10 mg/dL   Calcium 8.0 (*) 8.4 - 10.5 mg/dL   GFR calc non Af Amer 37 (*) >90 mL/min   GFR calc Af Amer 42 (*) >90 mL/min  PROTIME-INR     Status: None   Collection Time    10/29/13  5:50 AM      Result Value Ref Range   Prothrombin Time 14.6  11.6 - 15.2 seconds   INR 1.16  0.00 - 1.49  GLUCOSE, CAPILLARY     Status: Abnormal   Collection Time    10/29/13  6:31 AM      Result Value Ref Range   Glucose-Capillary 111 (*) 70 - 99 mg/dL   Comment 1 Notify RN     Comment 2 Documented in Chart     Dg Chest 2 View  10/29/2013   CLINICAL DATA:  Shortness breath.  Weakness.  EXAM: CHEST  2 VIEW  COMPARISON:  Chest x-ray 10/27/2013.  FINDINGS: Left internal jugular Cordis with tip in the left innominate vein is unchanged. Lung volumes are low. Persistent opacity at the base of the left hemithorax is favored to reflect postoperative subsegmental atelectasis (underlying airspace consolidation is difficult to exclude). There is also a superimposed moderate left pleural effusion. Linear opacity in the left mid lung is compatible with subsegmental atelectasis. No evidence of pulmonary edema. Cardiopericardial silhouette is mildly enlarged (unchanged). The patient is rotated to the left on today's exam, resulting in distortion of the mediastinal contours and reduced diagnostic sensitivity and specificity for mediastinal pathology. Atherosclerosis in the thoracic aorta. Status post median sternotomy for CABG. Epicardial pacing wires remain in position.  IMPRESSION: 1. Allowing for slight differences in patient positioning and technique, the  radiographic appearance the chest is essentially unchanged,  as detailed above.   Electronically Signed   By: Vinnie Langton M.D.   On: 10/29/2013 08:26    Assessment/Plan: Diagnosis: deconditioning after NSTEMI, CABG 1. Does the need for close, 24 hr/day medical supervision in concert with the patient's rehab needs make it unreasonable for this patient to be served in a less intensive setting? Yes 2. Co-Morbidities requiring supervision/potential complications: htn, morbid obesity, ckd 3. Due to bladder management, bowel management, safety, skin/wound care, disease management, medication administration, pain management and patient education, does the patient require 24 hr/day rehab nursing? Yes 4. Does the patient require coordinated care of a physician, rehab nurse, PT (1-2 hrs/day, 5 days/week) and OT (1-2 hrs/day, 5 days/week) to address physical and functional deficits in the context of the above medical diagnosis(es)? Yes Addressing deficits in the following areas: balance, endurance, locomotion, strength, transferring, bowel/bladder control, bathing, dressing, feeding, grooming, toileting and psychosocial support 5. Can the patient actively participate in an intensive therapy program of at least 3 hrs of therapy per day at least 5 days per week? Yes 6. The potential for patient to make measurable gains while on inpatient rehab is excellent 7. Anticipated functional outcomes upon discharge from inpatient rehab are mod I with PT, mod I to supervision with OT, n/a with SLP. 8. Estimated rehab length of stay to reach the above functional goals is: 10-14 days 9. Does the patient have adequate social supports to accommodate these discharge functional goals? Yes 10. Anticipated D/C setting: Home 11. Anticipated post D/C treatments: [facility] therapy 12. Overall Rehab/Functional Prognosis: excellent  RECOMMENDATIONS: This patient's condition is appropriate for continued rehabilitative care in the following setting: CIR Patient has agreed to participate in  recommended program. Yes Note that insurance prior authorization may be required for reimbursement for recommended care.  Comment: Rehab Admissions Coordinator to follow up. This patient is quite motivated and supports at home to assist. Would do well in our program where we can continuously monitor CV issues as well.   Thanks,  Meredith Staggers, MD, Mellody Drown     10/29/2013

## 2013-10-29 NOTE — Progress Notes (Signed)
Peripherally Inserted Central Catheter/Midline Placement  The IV Nurse has discussed with the patient and/or persons authorized to consent for the patient, the purpose of this procedure and the potential benefits and risks involved with this procedure.  The benefits include less needle sticks, lab draws from the catheter and patient may be discharged home with the catheter.  Risks include, but not limited to, infection, bleeding, blood clot (thrombus formation), and puncture of an artery; nerve damage and irregular heat beat.  Alternatives to this procedure were also discussed.  PICC/Midline Placement Documentation        Elaine [name] 10/29/2013, 2:49 PM

## 2013-10-29 NOTE — Progress Notes (Signed)
Nurse informed me patient went back into a fib with RVR earlier. She was given an Amiodarone bolus. She remains in a fib with HR 90-low 100's. I am going to give Amiodarone 400 mg now and start it bid as she is currently on Amiodarone 200 bid. Also, she is on Lopressor 12.5 bid. I will increase to 25 bid with parameters. Continue to monitor.

## 2013-10-29 NOTE — Discharge Summary (Addendum)
Physician Discharge Summary  Patient ID: Elaine [name] MRN: [id number] DOB/AGE: [birth year] 74 y.o.  Admit date: 10/20/2013 Discharge date: 11/05/2013  Admission Diagnoses:  Patient Active Problem List   Diagnosis Date Noted  . Atrial fibrillation 10/20/2013  . Left bundle branch block (LBBB) on electrocardiogram 10/20/2013  . Acute pulmonary edema with congestive heart failure 10/20/2013  . Left main coronary artery disease 10/20/2013    Class: Hospitalized for  . Acute myocardial infarction of anterior wall 10/20/2013  . Ischemic cardiomyopathy 10/20/2013  . Morbid obesity   . Diabetic neuropathy   . Chronic kidney disease   . Obesity 12/20/2006  . Type II diabetes mellitus with complication [id number]  . HYPERLIPIDEMIA 08/06/2006  . HYPERTENSION 08/06/2006  . OSTEOARTHRITIS 08/06/2006   Discharge Diagnoses:   Patient Active Problem List   Diagnosis Date Noted  . S/P CABG x 3 10/23/2013  . Atrial fibrillation 10/20/2013  . Left bundle branch block (LBBB) on electrocardiogram 10/20/2013  . Acute pulmonary edema with congestive heart failure 10/20/2013  . Left main coronary artery disease 10/20/2013    Class: Hospitalized for  . Acute myocardial infarction of anterior wall 10/20/2013  . Ischemic cardiomyopathy 10/20/2013  . Morbid obesity   . Diabetic neuropathy   . Chronic kidney disease   . Obesity 12/20/2006  . Type II diabetes mellitus with complication [id number]  . HYPERLIPIDEMIA 08/06/2006  . HYPERTENSION 08/06/2006  . OSTEOARTHRITIS 08/06/2006   Discharged Condition: good  History of Present Illness:   Elaine [name] is a 73 year old morbidly obese African American female from [place] with no previous cardiac history but multiple risk factors including history of long-standing hypertension, type 2 diabetes mellitus with complication, hyperlipidemia, and remote tobacco abuse who reports a six-week history of progressive substernal chest pressure and shortness of  breath. These symptoms have for the most part been worse following meals and at night while the patient has been trying to sleep, and at times the patient felt that belching would alleviate the substernal chest pressure. Symptoms were initially attributed to GE reflux and the patient has been treated with antacid therapies with minimal effectiveness. This past weekend symptoms became acutely worse and progressive until the morning of presentation when the patient awoke with severe resting shortness of breath and orthopnea. EMS was called and baseline electrocardiogram revealed atrial fibrillation with left bundle branch block. There were some ST segment abnormalities felt concerning for possible STEMI, and a code STEMI was called.  Upon arrival to the ED, the patient was taken directly to the cardiac Cath Lab by Dr. Ellyn Hack where catheterization demonstrates moderate left main coronary artery stenosis with high grade proximal stenosis of the left anterior descending coronary artery and severe three-vessel coronary artery disease. Left ventricular function was not assessed initially at cath but subsequent transthoracic echocardiogram was performed demonstrating severe global left ventricular dysfunction with severe anterior and apical hypokinesis or akinesis. The patient's symptoms of chest discomfort and shortness of breath had been dramatically alleviated with intravenous heparin and nitroglycerin. Cardiac thoracic surgical consultation was requested.  Hospital Course:   After catheterization the patient was admitted for further care.  She was evaluated by Dr. Roxy Manns who felt the patient would benefit from Coronary Bypass Grafting.  However she was felt to be high risk and would benefit from medical therapy of underlying acute heart failure.  The patient progressed well.  She has suffered from Atrial Fibrillation which later converted to NSR.  She remained chest pain free and her pulmonary edema improved.  The  patient's creatinine was elevated and her blood pressure declined.  She required discontinuation of ACE and Diuretic.  Her repeat creatinine showed improvement.  The patient remained otherwise medically stable.  She was taken to the operating room on 10/23/2013.  She underwent CABG x 3 LIMA to LAD, SVG to OM, and SVG to PLB.  She also underwent Clipping of Left Atrial Appendage.  She also underwent Endoscopic Saphenous Vein Harvest.  She tolerated the procedure well and was taken to the SICU in stable condition.  At arrival to the SICU the patient developed Rapid Atrial Fibrillation and was subsequently treated with IV Amiodarone and Lopressor.  Her blood pressure remained low despite rate control and she underwent AV pacing with cardioversion which improved patient's blood pressure.  The patient was extubated the morning after surgery.  During her stay in the ICU, the patient was weaned off Neo synephrine, Dopamine, and Milrinone as tolerated.  She was maintaining NSR and transitioned to oral Amiodarone.  Her chest tubes and arterial lines were removed without difficulty.  She was diuresed for hypervolemia.  She continued to make progress and was transferred to the step down unit in stable condition.    The patient continues to slowly progress.  She is volume overloaded and has been aggressively diuresed with IV Lasix.  She again developed Atrial Fibrillation and was treated with a bolus of IV Amiodarone.  She is on Coumadin for stroke prophylaxis.  Her INR is 1.6 and her goal range is 2.0-2.5.  She remains on 2 liters of oxygen via [place]. Last chest xray showed some bilateral effusions and atelectasis per CXR.  We will wean her oxygen down as tolerated. Her H and H decreased to 7.7 and 22.4 and she was symptomatic. She was transfused 2 units of PRBCs. Her H and H is up to 9.8 and 28.7 this am. Her INR is up to 2.16 on Coumadin 2.5. Lopressor was discontinued as HR in low 60's. She was started on Coreg 6.25 bid and  low dose Lisinopril was added. Her creatinine will have to be monitored as is on Lasix 80 bid and last creatine was 1.68. She has a small right pleural effusion and a moderate left pleural effusion. She is on 2 liters of oxygen via [place]. She should have CXR within a week to further evaluate the left pleural effusion and determine whether or not she would need a thoracentesis. She needs to be weighed daily.She has been accepted to CIR. She is surgically stable for discharge today.  Consults: cardiology  Significant Diagnostic Studies: angiography:   Sheaths: Venous sheath sutured in place. Arterial sheath removed - TR band placed.  Hemodynamic Findings:   SaO2%  Pressure mmHg  Mean P mmHg  EDP mmHg   Right Atrium   16/15   15   Right Ventricle   75/8   14   Pulmonary Artery  49  53/20  38    PCWP   31/40  38    Central Aortic  89  129/84  104    Left Ventricle   initial: 136/38   46     final: 126/18   33    Cardiac Output:   Cardiac Index:    Fick  4.15   1.94    Left Ventriculography: Not performed due to LVEDP of 46 mmHg  Coronary Anatomy: Diffuse moderate calcification of the proximal Left Coronary System  Left Main: Large-caliber vessel it tapers to a 70% distal stenosis  that is somewhat hazy in appearance. The vessel bifurcates into the LAD And Circumflex. LAD: Moderate large-caliber vessel with ostial 70% lesion followed by tandem 95% and 80% stenoses prior to SP1. There is a 95% stenosis just after SP1 & D1 takeoff  D1: Small-caliber vessel, diffusely diseased Left Circumflex: Large-caliber vessel that gives off a proximal obtuse marginal and then terminates as a large lateral OM 2 the OM 2 has proximal roughly 40% disease with several distal branches. There is a very small AV groove branch that comes off proximally.  RCA: Moderate large-caliber, dominant vessel with early mid 60-70% lesion at the first bend followed by a roughly 80% focal lesion just prior to the crux. The vessel then  bifurcates distally into the Right Posterior Descending Artery (RPDA) Right Posterior AV Groove Branch (RPAV)  RPDA: Moderate caliber vessel that reaches almost to the apex. Diffuse luminal irregularities  RPL Sysytem:The RPAV begins as a moderate caliber vessel with really gives off one major posterior lateral branch with several small branches and AV nodal artery. Diffuse luminal irregularities. After reviewing the initial angiography, the culprit lesion was thought to be distal left main with diffuse disease in the proximal and mid LAD.  The right heart catheterization was performed with the plan to leave Swan-Ganz catheter in place to allow for adequate diuresis.   Treatments: surgery:   Coronary Artery Bypass Grafting x 3  Left Internal Mammary Artery to Distal Left Anterior Descending Coronary Artery  Saphenous Vein Graft to Right Posterolateral BranchCoronary Artery  Saphenous Vein Graft to Second Obtuse Marginal Branch of Left Circumflex Coronary Artery  Endoscopic Vein Harvest from Right Thigh  Clipping of Left Atrial Appendage Atricure Left Atrial Clip, size 45 mm  Disposition:   Discharge Medications:    Medication List    STOP taking these medications       ALEVE 220 MG tablet  Generic drug:  naproxen sodium     amLODipine 5 MG tablet  Commonly known as:  NORVASC     benazepril 40 MG tablet  Commonly known as:  LOTENSIN     Exenatide ER 2 MG Susr  Commonly known as:  BYDUREON     metFORMIN 1000 MG tablet  Commonly known as:  GLUCOPHAGE     MULTIVITAMIN PO      TAKE these medications       amiodarone 200 MG tablet  Commonly known as:  PACERONE  Take 1 tablet (200 mg total) by mouth daily.     aspirin EC 81 MG tablet  Take 81 mg by mouth daily.     carvedilol 6.25 MG tablet  Commonly known as:  COREG  Take 1 tablet (6.25 mg total) by mouth 2 (two) times daily with a meal.     CRESTOR 20 MG tablet  Generic drug:  rosuvastatin  Take 10 mg by mouth at  bedtime.     feeding supplement (RESOURCE BREEZE) Liqd  Take 1 Container by mouth 3 (three) times daily with meals.     ferrous sulfate 325 (65 FE) MG tablet  Take 1 tablet (325 mg total) by mouth daily with breakfast.     furosemide 40 MG tablet  Commonly known as:  LASIX  Take 2 tablets (80 mg total) by mouth 2 (two) times daily.     GLUCOSAMINE CHONDR 1500 COMPLX PO  Take 2 tablets by mouth daily.     guaiFENesin 600 MG 12 hr tablet  Commonly known as:  MUCINEX  Take 1  tablet (600 mg total) by mouth 2 (two) times daily as needed for cough.     insulin aspart 100 UNIT/ML injection  Commonly known as:  novoLOG  - Inject 0-20 Units into the skin 3 (three) times daily with meals. Correction coverage: Resistant (obese, steroids)     -   CBG < 70: implement hypoglycemia protocol     -   CBG 70 - 120: 0 units     -   CBG > 400 call MD and obtain STAT lab verification     -   CBG 121 - 150: 3 units     -   CBG 151 - 200: 4 units     -   CBG 201 - 250: 7 units     -   CBG 251 - 300: 11 units     -   CBG 301 - 350: 15 units     -   CBG 351 - 400: 20 units     levalbuterol 0.63 MG/3ML nebulizer solution  Commonly known as:  XOPENEX  Take 3 mLs (0.63 mg total) by nebulization every 6 (six) hours as needed for wheezing or shortness of breath.     levothyroxine 112 MCG tablet  Commonly known as:  SYNTHROID, LEVOTHROID  Take 112 mcg by mouth daily before breakfast.     lisinopril 2.5 MG tablet  Commonly known as:  PRINIVIL,ZESTRIL  Take 1 tablet (2.5 mg total) by mouth daily.     potassium chloride SA 20 MEQ tablet  Commonly known as:  K-DUR,KLOR-CON  Take 1 tablet (20 mEq total) by mouth daily.     prednisoLONE acetate 1 % ophthalmic suspension  Commonly known as:  PRED FORTE  Place 1 drop into the right eye daily.     traMADol 50 MG tablet  Commonly known as:  ULTRAM  Take 1 tablet (50 mg total) by mouth every 6 (six) hours as needed for moderate pain.      warfarin 2.5 MG tablet  Commonly known as:  COUMADIN  Take 1 tablet (2.5 mg total) by mouth daily at 6 PM.        The patient has been discharged on:   1.Beta Blocker:  Yes [  x ]                              No   [   ]                              If No, reason:  2.Ace Inhibitor/ARB: Yes [  x ]                                     No  [    ]                                     If No, reason:   3.Statin:   Yes [ x  ]                  No  [   ]                  If No, reason:  4.Ecasa:  Yes  [ x  ]                  No   [   ]                  If No, reason:      Future Appointments Provider Department Dept Phone   11/17/2013 9:00 AM Cecilie Kicks, NP [facility] [phone]   11/24/2013 10:30 AM Rexene Alberts, MD [facility] [phone]   07/01/2014 9:15 AM Hayden Pedro, MD [place] [phone]     Follow-up Information   Follow up with Rexene Alberts, MD On 11/24/2013. (Appointment is at 10:30)    Specialty:  Cardiothoracic Surgery   Contact information:   [place] [address] [phone]       Follow up with [facility] On 11/24/2013. (Please get CXR at 9:30)    Contact information:   [place]       Follow up with [facility] R, NP On 11/17/2013. (Appointment is at 9:30)    Specialty:  Cardiology   Contact information:   [address] [place] [address] [phone]       Follow up with CIR. (Please draw PT and INR daily. INR to be beween 2-2.5.)       Signed: Nani Skillern PA-C 11/05/2013, 7:49 AM

## 2013-10-29 NOTE — Progress Notes (Signed)
CARDIAC REHAB PHASE I   PRE:  Rate/Rhythm: 117 afib    BP: sitting 101/69    SaO2: 98 2L  MODE:  Ambulation: 70 ft   POST:  Rate/Rhythm: 126 afib    BP: sitting 125/93     SaO2: 99 2L  Pt motivated but very DOE, presumably due to Afib and fluid. Could only walk a few steps without stopping to rest and catch breath. Pt also very diaphoretic. Sat x5 in 70 ft. Pt gasping for breath, needed verbal cues to slow breathing. Afib highest in 120s.  Return to recliner. Son present and very supportive. [id number]  Josephina Shih [place] CES, ACSM 10/29/2013 2:13 PM

## 2013-10-29 NOTE — Progress Notes (Signed)
Clinical Social Work Department CLINICAL SOCIAL WORK PLACEMENT NOTE 10/29/2013  Patient:  Elaine [name], Elaine [name]  Account Number:  [id number] Admit date:  10/20/2013  Clinical Social Worker:  Megan Salon  Date/time:  10/29/2013 06:16 PM  Clinical Social Work is seeking post-discharge placement for this patient at the following level of care:   [place]   (*CSW will update this form in Epic as items are completed)   10/29/2013  Patient/family provided with [place] Department of Clinical Social Work's list of facilities offering this level of care within the geographic area requested by the patient (or if unable, by the patient's family).  10/29/2013  Patient/family informed of their freedom to choose among providers that offer the needed level of care, that participate in Medicare, Medicaid or managed care program needed by the patient, have an available bed and are willing to accept the patient.  10/29/2013  Patient/family informed of [facility]' ownership interest in [facility], as well as of the fact that they are under no obligation to receive care at this facility.  PASARR submitted to EDS on 10/29/2013 PASARR number received from EDS on 10/29/2013  FL2 transmitted to all facilities in geographic area requested by pt/family on  10/29/2013 FL2 transmitted to all facilities within larger geographic area on   Patient informed that his/her managed care company has contracts with or will negotiate with  certain facilities, including the following:     Patient/family informed of bed offers received:   Patient chooses bed at  Physician recommends and patient chooses bed at    Patient to be transferred to  on   Patient to be transferred to facility by   The following physician request were entered in Epic:   Additional Comments:  Jeanette Caprice, MSW, [place]

## 2013-10-29 NOTE — Progress Notes (Signed)
Clinical Social Work Department BRIEF PSYCHOSOCIAL ASSESSMENT 10/29/2013  Patient:  Elaine [name], Elaine [name]     Account Number:  [id number]     Admit date:  10/20/2013  Clinical Social Worker:  Megan Salon  Date/Time:  10/29/2013 06:11 PM  Referred by:  RN  Date Referred:  10/29/2013 Referred for  SNF Placement   Other Referral:   Interview type:  Other - See comment Other interview type:   CSW spoke to patient and patient's sons by bedside    PSYCHOSOCIAL DATA Living Status:  HUSBAND Admitted from facility:   Level of care:   Primary support name:  Morris Leitao Primary support relationship to patient:  SPOUSE Degree of support available:   Supportive family    CURRENT CONCERNS Current Concerns  Post-Acute Placement   Other Concerns:    SOCIAL WORK ASSESSMENT / PLAN Per RN, CIR is looking at patient to see if they are able to take patient for rehab. As a backup, CSW will do SNF placement as well.  CSW introduced self and explained reason for visit. Patient had visitors by bedside(her sons), which patient states they are going to help with the dc plan. CSW explained SNF process and provided SNF packet to patient and family.  Patient reported she is agreeable for SNF placement if CIR is not able to take patient..  CSW will complete FL2 for MD's signature and will update patient and family when bed offers are received.   Assessment/plan status:  Psychosocial Support/Ongoing Assessment of Needs Other assessment/ plan:   Information/referral to community resources:   SNF information/CSW contact information    PATIENT'S/FAMILY'S RESPONSE TO PLAN OF CARE: Patient and patient's family are agreeable to SNF placement and state they just do not want to go to [facility].       Jeanette Caprice, MSW, [place]

## 2013-10-30 DIAGNOSIS — R5381 Other malaise: Secondary | ICD-10-CM

## 2013-10-30 DIAGNOSIS — I2510 Atherosclerotic heart disease of native coronary artery without angina pectoris: Secondary | ICD-10-CM

## 2013-10-30 LAB — BASIC METABOLIC PANEL
BUN: 28 mg/dL — ABNORMAL HIGH (ref 6–23)
CO2: 24 mEq/L (ref 19–32)
Calcium: 8.4 mg/dL (ref 8.4–10.5)
Chloride: 102 mEq/L (ref 96–112)
Creatinine, Ser: 1.54 mg/dL — ABNORMAL HIGH (ref 0.50–1.10)
GFR calc Af Amer: 37 mL/min — ABNORMAL LOW (ref >90)
GFR, EST NON AFRICAN AMERICAN: 32 mL/min — AB (ref >90)
Glucose, Bld: 105 mg/dL — ABNORMAL HIGH (ref 70–99)
POTASSIUM: 4.5 meq/L (ref 3.7–5.3)
SODIUM: 139 meq/L (ref 137–147)

## 2013-10-30 LAB — PROTIME-INR
INR: 1.14 (ref 0.00–1.49)
Prothrombin Time: 14.4 seconds (ref 11.6–15.2)

## 2013-10-30 MED ORDER — FUROSEMIDE 80 MG PO TABS
80.0000 mg | ORAL_TABLET | Freq: Two times a day (BID) | ORAL | Status: DC
  Administered 2013-10-30 – 2013-10-31 (×2): 80 mg via ORAL
  Filled 2013-10-30 (×4): qty 1

## 2013-10-30 MED ORDER — INSULIN ASPART 100 UNIT/ML ~~LOC~~ SOLN
0.0000 [IU] | Freq: Three times a day (TID) | SUBCUTANEOUS | Status: DC
Start: 2013-10-30 — End: 2013-11-05
  Administered 2013-10-30 (×2): 7 [IU] via SUBCUTANEOUS
  Administered 2013-10-31 – 2013-11-01 (×3): 3 [IU] via SUBCUTANEOUS
  Administered 2013-11-01: 4 [IU] via SUBCUTANEOUS
  Administered 2013-11-02: 7 [IU] via SUBCUTANEOUS
  Administered 2013-11-02 – 2013-11-03 (×2): 3 [IU] via SUBCUTANEOUS
  Administered 2013-11-03: 7 [IU] via SUBCUTANEOUS
  Administered 2013-11-04 – 2013-11-05 (×5): 3 [IU] via SUBCUTANEOUS

## 2013-10-30 MED ORDER — WARFARIN SODIUM 5 MG PO TABS
5.0000 mg | ORAL_TABLET | Freq: Every day | ORAL | Status: DC
  Administered 2013-10-30 – 2013-11-01 (×3): 5 mg via ORAL
  Filled 2013-10-30 (×4): qty 1

## 2013-10-30 NOTE — H&P (Signed)
Physical Medicine and Rehabilitation Admission H&P    Chief Complaint  Patient presents with  . Chest Pain  :  Chief complaint: Weakness  HPI: Elaine [name] is a 73 y.o. right-handed female with history of hypertension, type 2 diabetes mellitus, obesity and former tobacco abuse. Patient independent prior to admission caring for her husband. Admitted 10/20/2013 with persistent chest pressure and shortness of breath with lower extremity edema. Noted to be in atrial fibrillation with rate in the low 100s. There was some ST segment abnormalities concerning for possible STEMI. Cardiac catheterization demonstrates moderate left main coronary artery stenosis with high-grade proximal stenosis of the left anterior descending coronary artery and three-vessel disease. Underwent CABG x3 as well as clipping of left atrial appendage 10/23/2013 per Dr. Roxy Manns. Hospital course orthostasis maintained on dopamine. Bouts of fluid overload with diuresis ongoing. Acute blood loss anemia 9.8 and monitored on iron supplement. Coumadin initiated in relation to atrial fibrillation bypass surgery. . She is tolerating a mechanical soft diet. Physical therapy evaluation completed 10/26/2013 with sternal precautions. Recommendations have been made for physical medicine rehabilitation consult. Patient was admitted for comprehensive rehabilitation program   ROS Review of Systems  Respiratory: Positive for shortness of breath.  Cardiovascular: Positive for chest pain.  Gastrointestinal: Positive for constipation.  Musculoskeletal: Positive for joint pain and myalgias.  All other systems reviewed and are negative  Past Medical History  Diagnosis Date  . Arthritis   . Diabetes mellitus   . Heart murmur   . Hyperlipidemia   . Hypertension   . Hypothyroidism   . NSTEMI (non-ST elevated myocardial infarction) 10/20/2013  . Ischemic cardiomyopathy 10/20/2013  . Acute pulmonary edema with congestive heart failure 10/20/2013    . Left bundle branch block (LBBB) on electrocardiogram   . Atrial fibrillation 10/20/2013  . OSTEOARTHRITIS 08/06/2006  . Morbid obesity   . Diabetic neuropathy   . Type II diabetes mellitus with complication 70/35/0093  . Chronic kidney disease   . S/P CABG x 3 10/23/2013    LIMA to LAD, SVG to OM2, SVG to RPLB, EVH via right thigh  . Acute myocardial infarction of anterior wall 10/20/2013   Past Surgical History  Procedure Laterality Date  . Corneal transplant  2011    right eye  . Total hip arthroplasty  2010, 2011    right 2010, left 2011  . Abdominal hysterectomy    . Coronary artery bypass graft N/A 10/23/2013    Procedure: CORONARY ARTERY BYPASS GRAFTING (CABG);  Surgeon: Rexene Alberts, MD;  Location: [place];  Service: Open Heart Surgery;  Laterality: N/A;  . Intraoperative transesophageal echocardiogram N/A 10/23/2013    Procedure: INTRAOPERATIVE TRANSESOPHAGEAL ECHOCARDIOGRAM;  Surgeon: Rexene Alberts, MD;  Location: [place];  Service: Open Heart Surgery;  Laterality: N/A;  . Clipping of atrial appendage N/A 10/23/2013    Procedure: CLIPPING OF ATRIAL APPENDAGE;  Surgeon: Rexene Alberts, MD;  Location: [place];  Service: Open Heart Surgery;  Laterality: N/A;   Family History  Problem Relation Age of Onset  . Colon cancer Neg Hx   . Esophageal cancer Neg Hx   . Rectal cancer Neg Hx   . Stomach cancer Neg Hx    Social History:  reports that she quit smoking about 20 years ago. She has never used smokeless tobacco. She reports that she does not drink alcohol or use illicit drugs. Allergies:  Allergies  Allergen Reactions  . Tape Rash    Adhesive tape  Medications Prior to Admission  Medication Dose Route Frequency Provider Last Rate Last Dose  . 0.9 %  sodium chloride infusion  500 mL Intravenous Continuous Sable Feil, MD       Medications Prior to Admission  Medication Sig Dispense Refill  . amLODipine (NORVASC) 5 MG tablet Take 5 mg by mouth daily.      [name]  aspirin EC 81 MG tablet Take 81 mg by mouth daily.      . benazepril (LOTENSIN) 40 MG tablet Take 40 mg by mouth daily.      . CRESTOR 20 MG tablet Take 10 mg by mouth at bedtime.       . Exenatide (BYDUREON) 2 MG SUSR Inject 2 mg into the skin once a week.       . furosemide (LASIX) 40 MG tablet Take 40 mg by mouth daily.      . Glucosamine-Chondroit-Vit C-Mn (GLUCOSAMINE CHONDR 1500 COMPLX PO) Take 2 tablets by mouth daily.      [name] levothyroxine (SYNTHROID, LEVOTHROID) 112 MCG tablet Take 112 mcg by mouth daily before breakfast.       . metFORMIN (GLUCOPHAGE) 1000 MG tablet 1,000 mg 2 (two) times daily with a meal.       . Multiple Vitamins-Minerals (MULTIVITAMIN PO) Take 1 tablet by mouth daily.      . naproxen sodium (ALEVE) 220 MG tablet Take 220 mg by mouth at bedtime.      . prednisoLONE acetate (PRED FORTE) 1 % ophthalmic suspension Place 1 drop into the right eye daily.        Home: Home Living Family/patient expects to be discharged to:: Private residence Living Arrangements: Spouse/significant other Available Help at Discharge: Family;Available 24 hours/day (plans to stay with her daughter) Type of Home: Apartment Home Access: Stairs to enter [employer] of Steps: 6 Entrance Stairs-Rails: None Home Layout: One level Home Equipment: Cane - single point;Walker - 4 wheels;Bedside commode;Shower seat Additional Comments: spouse has early dementia; grandson & son are caring for him; pt to go stay with her daughter   Functional History: Prior Function Comments: used cane outside; used seat in shower  Functional Status:  Mobility:min to mod assist with basic transfers and ambulation     Ambulation/Gait Ambulation Distance (Feet): 86 Feet (74, seated rest, 12) General Gait Details: Unable due to tachycardia (136 with standing) RN made aware and in to see pt    ADL:    Cognition: Cognition Overall Cognitive Status: Within Functional Limits for tasks  assessed Orientation Level: Oriented X4 Cognition Arousal/Alertness: Awake/alert Behavior During Therapy: WFL for tasks assessed/performed Overall Cognitive Status: Within Functional Limits for tasks assessed  Physical Exam: Blood pressure 106/48, pulse 76, temperature 98.3 F (36.8 C), temperature source Oral, resp. rate 20, height 5' 1.81" (1.57 m), weight 122.6 kg (270 lb 4.5 oz), SpO2 100.00%. Physical Exam Constitutional: She is oriented to person, place, and time. No distress.  obese  HENT: only a few remaining teeth. Oral mucosa pink and moist Head: Normocephalic and atraumatic.  Eyes: EOM are normal.  Right eye vision limited  Neck: Normal range of motion. Neck supple. No JVD present. No tracheal deviation present. No thyromegaly present.  Cardiovascular: Normal rate.  Cardiac rate controlled. No murmurs, rubs, or gallops Respiratory:  Decreased breath sounds at the bases but clear to auscultation. No wheezes, rales, or rhonchi GI: Soft. Bowel sounds are normal. She exhibits no distension.  Obese  Neurological: She is alert and oriented to person, place,  and time.  UE 4/5 prox to distally bilaterally .LLE 3-/5 HF, RLE 1+ HF (pain), BLLE: 3- to 3KE and 4+/5 ADF/APF bilaterally.  Skin:  midline chest incision clean and dry. Leg incision intact/tender along right inner thigh  Psychiatric: She has a normal mood and affect. Her behavior is normal. Judgment and thought content normal  Results for orders placed during the hospital encounter of 10/20/13 (from the past 48 hour(s))  GLUCOSE, CAPILLARY     Status: Abnormal   Collection Time    10/28/13 12:01 PM      Result Value Ref Range   Glucose-Capillary 167 (*) 70 - 99 mg/dL  GLUCOSE, CAPILLARY     Status: Abnormal   Collection Time    10/28/13  6:25 PM      Result Value Ref Range   Glucose-Capillary 148 (*) 70 - 99 mg/dL  GLUCOSE, CAPILLARY     Status: Abnormal   Collection Time    10/28/13  9:58 PM      Result Value  Ref Range   Glucose-Capillary 142 (*) 70 - 99 mg/dL  CBC     Status: Abnormal   Collection Time    10/29/13  5:50 AM      Result Value Ref Range   WBC 8.3  4.0 - 10.5 K/uL   RBC 3.05 (*) 3.87 - 5.11 MIL/uL   Hemoglobin 8.4 (*) 12.0 - 15.0 g/dL   HCT 24.9 (*) 36.0 - 46.0 %   MCV 81.6  78.0 - 100.0 fL   MCH 27.5  26.0 - 34.0 pg   MCHC 33.7  30.0 - 36.0 g/dL   RDW 16.0 (*) 11.5 - 15.5 %   Platelets 279  150 - 400 K/uL  BASIC METABOLIC PANEL     Status: Abnormal   Collection Time    10/29/13  5:50 AM      Result Value Ref Range   Sodium 138  137 - 147 mEq/L   Potassium 4.3  3.7 - 5.3 mEq/L   Chloride 104  96 - 112 mEq/L   CO2 23  19 - 32 mEq/L   Glucose, Bld 111 (*) 70 - 99 mg/dL   BUN 25 (*) 6 - 23 mg/dL   Creatinine, Ser 1.39 (*) 0.50 - 1.10 mg/dL   Calcium 8.0 (*) 8.4 - 10.5 mg/dL   GFR calc non Af Amer 37 (*) >90 mL/min   GFR calc Af Amer 42 (*) >90 mL/min   Comment: (NOTE)     The eGFR has been calculated using the CKD EPI equation.     This calculation has not been validated in all clinical situations.     eGFR's persistently <90 mL/min signify possible Chronic Kidney     Disease.  PROTIME-INR     Status: None   Collection Time    10/29/13  5:50 AM      Result Value Ref Range   Prothrombin Time 14.6  11.6 - 15.2 seconds   INR 1.16  0.00 - 1.49  GLUCOSE, CAPILLARY     Status: Abnormal   Collection Time    10/29/13  6:31 AM      Result Value Ref Range   Glucose-Capillary 111 (*) 70 - 99 mg/dL   Comment 1 Notify RN     Comment 2 Documented in Chart    GLUCOSE, CAPILLARY     Status: Abnormal   Collection Time    10/29/13  9:51 PM      Result Value Ref  Range   Glucose-Capillary 243 (*) 70 - 99 mg/dL  PROTIME-INR     Status: None   Collection Time    10/30/13  5:00 AM      Result Value Ref Range   Prothrombin Time 14.4  11.6 - 15.2 seconds   INR 1.14  0.00 - 1.49  BASIC METABOLIC PANEL     Status: Abnormal   Collection Time    10/30/13  5:00 AM      Result  Value Ref Range   Sodium 139  137 - 147 mEq/L   Potassium 4.5  3.7 - 5.3 mEq/L   Chloride 102  96 - 112 mEq/L   CO2 24  19 - 32 mEq/L   Glucose, Bld 105 (*) 70 - 99 mg/dL   BUN 28 (*) 6 - 23 mg/dL   Creatinine, Ser 1.54 (*) 0.50 - 1.10 mg/dL   Calcium 8.4  8.4 - 10.5 mg/dL   GFR calc non Af Amer 32 (*) >90 mL/min   GFR calc Af Amer 37 (*) >90 mL/min   Comment: (NOTE)     The eGFR has been calculated using the CKD EPI equation.     This calculation has not been validated in all clinical situations.     eGFR's persistently <90 mL/min signify possible Chronic Kidney     Disease.   Dg Chest 2 View  10/29/2013   CLINICAL DATA:  Shortness breath.  Weakness.  EXAM: CHEST  2 VIEW  COMPARISON:  Chest x-ray 10/27/2013.  FINDINGS: Left internal jugular Cordis with tip in the left innominate vein is unchanged. Lung volumes are low. Persistent opacity at the base of the left hemithorax is favored to reflect postoperative subsegmental atelectasis (underlying airspace consolidation is difficult to exclude). There is also a superimposed moderate left pleural effusion. Linear opacity in the left mid lung is compatible with subsegmental atelectasis. No evidence of pulmonary edema. Cardiopericardial silhouette is mildly enlarged (unchanged). The patient is rotated to the left on today's exam, resulting in distortion of the mediastinal contours and reduced diagnostic sensitivity and specificity for mediastinal pathology. Atherosclerosis in the thoracic aorta. Status post median sternotomy for CABG. Epicardial pacing wires remain in position.  IMPRESSION: 1. Allowing for slight differences in patient positioning and technique, the radiographic appearance the chest is essentially unchanged, as detailed above.   Electronically Signed   By: Daniel  Entrikin M.D.   On: 10/29/2013 08:26   Dg Chest Port 1 View  10/29/2013   CLINICAL DATA:  PICC placement.  EXAM: PORTABLE CHEST - 1 VIEW  COMPARISON:  Chest x-ray 10/29/2013.   FINDINGS: There is a right upper extremity PICC with tip terminating in the distal superior vena cava. Left internal jugular central venous Cordis with tip in the left innominate vein. Bibasilar opacities (left greater than right) favored to predominantly reflect postoperative subsegmental atelectasis with superimposed moderate left and increasing small to moderate right pleural effusion, although underlying airspace consolidation is difficult to exclude. No pneumothorax. No evidence of pulmonary edema. Heart size appears mildly enlarged. Status post median sternotomy for CABG and left atrial appendage ligation. Atherosclerosis in the thoracic aorta.  IMPRESSION: 1. Support apparatus and postoperative changes, as above. 2. Worsening aeration throughout the lung bases bilaterally, which may simply reflect pleural effusions (increasing on the right) with associated postoperative atelectasis, although developing airspace consolidation from infection is difficult to exclude.   Electronically Signed   By: Daniel  Entrikin M.D.   On: 10/29/2013 15:37    Post   Admission Physician Evaluation: 1. Functional deficits secondary  to deconditioning after STEMI/CABG. 2. Patient is admitted to receive collaborative, interdisciplinary care between the physiatrist, rehab nursing staff, and therapy team. 3. Patient's level of medical complexity and substantial therapy needs in context of that medical necessity cannot be provided at a lesser intensity of care such as a SNF. 4. Patient has experienced substantial functional loss from his/her baseline which was documented above under the "Functional History" and "Functional Status" headings.  Judging by the patient's diagnosis, physical exam, and functional history, the patient has potential for functional progress which will result in measurable gains while on inpatient rehab.  These gains will be of substantial and practical use upon discharge  in facilitating mobility and  self-care at the household level. 5. Physiatrist will provide 24 hour management of medical needs as well as oversight of the therapy plan/treatment and provide guidance as appropriate regarding the interaction of the two. 6. 24 hour rehab nursing will assist with bladder management, bowel management, safety, skin/wound care, disease management, medication administration, pain management and patient education  and help integrate therapy concepts, techniques,education, etc. 7. PT will assess and treat for/with: Lower extremity strength, range of motion, stamina, balance, functional mobility, safety, adaptive techniques and equipment, CV stamina, pain mgt, observance of sternal precautions.   Goals are: supervision to mod I. 8. OT will assess and treat for/with: ADL's, functional mobility, safety, upper extremity strength, adaptive techniques and equipment, pain mgt, adherence to sternal precautions, education.   Goals are: mod I to supervision. 9. SLP will assess and treat for/with: n/a.  Goals are: n/a (on D3 due to teeth). 10. Case Management and Social Worker will assess and treat for psychological issues and discharge planning. 11. Team conference will be held weekly to assess progress toward goals and to determine barriers to discharge. 12. Patient will receive at least 3 hours of therapy per day at least 5 days per week. 13. ELOS: 7-9 days       14. Prognosis:  excellent   Medical Problem List and Plan: 1. Deconditioning after STEMI/CABG 10/23/2013. Sternal precautions 2. DVT Prophylaxis/Anticoagulation: Coumadin per pharmacy. Latest INR 2.11 Monitor platelet counts and any signs of bleeding 3. Pain Management: Oxycodone as needed. Monitor with increased mobility 4. Neuropsych: This patient is capable of making decisions on her own behalf. 5. Hypertension/atrial fibrillation. Continue amiodarone as directed, Coreg 6.25 mg twice a day, lisinopril 2.5 mg daily. Rate control. No chest pain or  shortness of breath 6. Acute on chronic anemia. Latest hemoglobin 9.8. Continue iron supplement 7. Acute pulmonary edema with congestive heart failure. Continue Lasix. Monitor for any signs of fluid overload--with daily weights and I's and O's 8. Hypothyroidism. Synthroid 9. Type 2 diabetes mellitus. Hemoglobin A1c 5.7. Blood sugars 111-243. Continue sliding scale insulin. Check blood sugars a.c. and at bedtime. Patient on Glucophage 1000 mg twice a day prior to admission not resume secondary to elevated creatinine 1.68. Will resume as needed 10. Hyperlipidemia. Lipitor  Meredith Staggers, MD, [facility]    10/30/2013

## 2013-10-30 NOTE — Progress Notes (Signed)
CARDIAC REHAB PHASE I   PRE:  Rate/Rhythm: 76SR  BP:  Supine: 111/47  Sitting:   Standing:    SaO2: 100%4L  MODE:  Ambulation: 110 ft   POST:  Rate/Rhythm: 93 SR  BP:  Supine:   Sitting: 122/60  Standing   SaO2: 93-96%2L [id number] Pt assisted to [facility] prior to walk. Pt asking to be more independent in trying to get up by herself. Pt able to stand unassisted. Walked 110 ft on 2L with rollator and asst x 2 sitting three times to rest. Takes a good few minutes for pt to recover and feel up to trying to go farther. Pt states she can tell when her breathing gets better so she can go farther. To recliner after walk. Sats good on 2L. Visitors in room.   Graylon Good, RN BSN  10/30/2013 11:37 AM

## 2013-10-30 NOTE — Progress Notes (Signed)
dc'ed pacing wires per unit protacall

## 2013-10-30 NOTE — Progress Notes (Signed)
CSW called patient's son to inform him of bed offers. Patient's son is very interested in CIR and states that he would rather just meet tomorrow afternoon to talk about bed offers. CSW will meet with family tomorrow afternoon.  Jeanette Caprice, MSW, [place]

## 2013-10-30 NOTE — Evaluation (Signed)
Occupational Therapy Evaluation Patient Details Name: Elaine [name] MRN: [id number] DOB: 1940-09-24 Today's Date: 10/30/2013 Time: [id number] OT Time Calculation (min): 21 min  OT Assessment / Plan / Recommendation History of present illness 73 y.o. adm 10/20/13 with several weeks of ongoing/persistent chest pressure and now shortness of breath and edema. Upon EMS arrival EKG showed atrial fibrillation with a left bundle branch block. Code STEMI was called. 2/26 underwent CABG x 3 with clipping of Lt atrial appendage. PMH of HTN, DM-2, obesity & former smoker   Clinical Impression   Pt demos decline in function and safety with ADLs and ADL mobility safety with decreased strength, balance and endurance and pt would benefit from acute OT services to address impairments to increase level of function and safety    OT Assessment  Patient needs continued OT Services    Follow Up Recommendations  CIR;Other (comment) (pt would like short term SNF in CIR not an option)    Barriers to Discharge Decreased caregiver support pt at home with husband with dementia that she cares for  Equipment Recommendations       Recommendations for Other Services    Frequency  Min 2X/week    Precautions / Restrictions Precautions Precautions: Sternal;Fall Precaution Comments: pt able to state not to use her arms at start of session, however pt attmepting to use B UEs for sit - stand from recliner mid session and required cues to use pillow Restrictions Weight Bearing Restrictions: No Other Position/Activity Restrictions: sternal precautions; pt able to state "no pushing" only   Pertinent Vitals/Pain 4/10 chest area    ADL  Grooming: Performed;Wash/dry hands;Wash/dry face;Min guard Where Assessed - Grooming: Supported standing Upper Body Bathing: Simulated;Supervision/safety;Set up Where Assessed - Upper Body Bathing: Unsupported sitting Lower Body Bathing: Simulated;Maximal assistance Upper Body Dressing:  Performed;Supervision/safety;Set up Where Assessed - Upper Body Dressing: Unsupported sitting Lower Body Dressing: Maximal assistance;+1 Total assistance Toilet Transfer: Performed;Moderate assistance Toilet Transfer Method: Sit to stand Toilet Transfer Equipment: Bedside commode Toileting - Clothing Manipulation and Hygiene: Performed;Moderate assistance Where Assessed - Toileting Clothing Manipulation and Hygiene: Standing Tub/Shower Transfer Method: Not assessed Equipment Used: Rolling walker;Other (comment) (BSC) Transfers/Ambulation Related to ADLs: cues to use pillow for sit - stand and not to push up with UEs, cues for not pushing weight through UEs when using RW    OT Diagnosis: Generalized weakness  OT Problem List: Decreased strength;Impaired balance (sitting and/or standing);Pain;Decreased activity tolerance;Decreased knowledge of precautions;Decreased knowledge of use of DME or AE OT Treatment Interventions: Self-care/ADL training;Therapeutic exercise;Patient/family education;Neuromuscular education;Balance training;Therapeutic activities;DME and/or AE instruction   OT Goals(Current goals can be found in the care plan section) Acute Rehab OT Goals Patient Stated Goal: eventually return home to care for her husband with mild dementia OT Goal Formulation: With patient/family Time For Goal Achievement: 11/06/13 Potential to Achieve Goals: Good ADL Goals Pt Will Perform Grooming: with supervision;with set-up;standing Pt Will Perform Lower Body Bathing: with mod assist;with adaptive equipment;sitting/lateral leans;sit to/from stand Pt Will Perform Lower Body Dressing: with max assist;with mod assist;with adaptive equipment;sitting/lateral leans;sit to/from stand Pt Will Transfer to Toilet: with min assist;with min guard assist;bedside commode;grab bars;ambulating;regular height toilet (3 in 1 over toilet) Pt Will Perform Toileting - Clothing Manipulation and hygiene: with min  assist;sitting/lateral leans;sit to/from stand Pt Will Perform Tub/Shower Transfer: with min assist;tub bench  Visit Information  Last OT Received On: 10/30/13 Assistance Needed: +1 History of Present Illness: 73 y.o. adm 10/20/13 with several weeks of ongoing/persistent chest pressure and now  shortness of breath and edema. Upon EMS arrival EKG showed atrial fibrillation with a left bundle branch block. Code STEMI was called. 2/26 underwent CABG x 3 with clipping of Lt atrial appendage. PMH of HTN, DM-2, obesity & former smoker       Prior [place] expects to be discharged to:: Private residence Living Arrangements: Spouse/significant other Available Help at Discharge: Family;Available 24 hours/day Type of Home: Apartment Home Access: Stairs to enter Entrance Stairs-Number of Steps: 6 Entrance Stairs-Rails: None Home Layout: One level Home Equipment: Cane - single point;Walker - 4 wheels;Bedside commode;Shower seat Additional Comments: spouse has early dementia; grandson & son are caring for him; pt to go stay with her daughter Prior Function Level of Independence: Independent with assistive device(s) Comments: used cane outside; used seat in shower Communication Communication: No difficulties Dominant Hand: Right         Vision/Perception Vision - History Baseline Vision: Wears glasses only for reading Patient Visual Report: No change from baseline Perception Perception: Within Functional Limits   Cognition  Cognition Arousal/Alertness: Awake/alert Behavior During Therapy: WFL for tasks assessed/performed Overall Cognitive Status: Within Functional Limits for tasks assessed Memory: Decreased recall of precautions    Extremity/Trunk Assessment Upper Extremity Assessment Upper Extremity Assessment: Overall WFL for tasks assessed;Generalized weakness Lower Extremity Assessment Lower Extremity Assessment: Defer to PT  evaluation Cervical / Trunk Assessment Cervical / Trunk Assessment: Normal     Mobility Bed Mobility General bed mobility comments: pt up in recliner Transfers Overall transfer level: Needs assistance Equipment used: Rolling walker (2 wheeled);1 person hand held assist Transfers: Sit to/from Stand Sit to Stand: Mod assist General transfer comment: cues to use pillow for sit - stand and not to push up with UEs          Balance Balance Overall balance assessment: Needs assistance Sitting-balance support: No upper extremity supported;Feet supported Sitting balance-Leahy Scale: Good Standing balance support: Single extremity supported;Bilateral upper extremity supported;During functional activity;No upper extremity supported Standing balance-Leahy Scale: Fair General Comments General comments (skin integrity, edema, etc.): B LEs with edema   End of Session OT - End of Session Equipment Utilized During Treatment: Other (comment);Rolling walker [facility]) Activity Tolerance: Patient limited by fatigue Patient left: in chair;with call bell/phone within reach;with family/visitor present  GO     Britt Bottom 10/30/2013, 1:27 PM

## 2013-10-30 NOTE — Progress Notes (Addendum)
      [address]       [place] [address]             [phone]        7 Days Post-Op Procedure(s) (LRB): CORONARY ARTERY BYPASS GRAFTING (CABG) (N/A) INTRAOPERATIVE TRANSESOPHAGEAL ECHOCARDIOGRAM (N/A) CLIPPING OF ATRIAL APPENDAGE (N/A)  Subjective: Patient sitting in chair eating breakfast. Has shortness of breath, at times, worse with exertion.   Objective: Vital signs in last 24 hours: Temp:  [97.9 F (36.6 C)-99.2 F (37.3 C)] 98.3 F (36.8 C) (03/05 0559) Pulse Rate:  [72-136] 72 (03/05 0559) Cardiac Rhythm:  [-] Normal sinus rhythm;Atrial fibrillation (03/05 0732) Resp:  [16-18] 18 (03/05 0559) BP: (100-105)/(61-69) 100/63 mmHg (03/05 0559) SpO2:  [97 %-100 %] 98 % (03/05 0559) Weight:  [122.6 kg (270 lb 4.5 oz)] 122.6 kg (270 lb 4.5 oz) (03/05 0559)  Pre op weight 113 kg Current Weight  10/30/13 122.6 kg (270 lb 4.5 oz)      Intake/Output from previous day: 03/04 0701 - 03/05 0700 In: 360 [P.O.:360] Out: -    Physical Exam:  Cardiovascular: RRR Pulmonary: Diminshed breath sounds at bases; no rales, wheezes, or rhonchi. Abdomen: Soft, non tender, bowel sounds present. Extremities:Bilateral lower extremity edema. Wounds: Clean and dry.  No erythema or signs of infection.  Lab Results: CBC:  Recent Labs  10/29/13 0550  WBC 8.3  HGB 8.4*  HCT 24.9*  PLT 279   BMET:   Recent Labs  10/28/13 0440 10/29/13 0550  NA 137 138  K 4.4 4.3  CL 102 104  CO2 22 23  GLUCOSE 68* 111*  BUN 29* 25*  CREATININE 1.38* 1.39*  CALCIUM 8.5 8.0*    PT/INR:  Lab Results  Component Value Date   INR 1.14 10/30/2013   INR 1.16 10/29/2013   INR 1.27 10/23/2013   ABG:  INR: Will add last result for INR, ABG once components are confirmed Will add last 4 CBG results once components are confirmed  Assessment/Plan:  1. CV - S/p STEMI.Went into a fib with RVR mid morning yesterday. Was given Amiodarone 150 mg IV bolus. I increased her  Lopressor and Amiodarone yesterday.On Lopressor 25 bid, Amiodarone 400 bid, and Coumadin. INR 1.6. Will need to increase Coumadin dose. 2.  Pulmonary - On 2 liters of oxygen via [place]-wean as tolerates.CXR appears to show no pneumothorax, bilateral pleural effusions and atelectasis, mild cardiomegaly.Encourage incentive spirometer. Mucinex for cough. 3. Volume Overload - Was given IV Lasix twice yesterday. Will continue with oral bid today. 4.  Acute blood loss anemia - H and H 8.4 and 24.9. Continue Ferrous. 6. Creatinine remains 1.39. Not on ACE 8.Remove EPW 9. Patient states she discussed with family, and when ready for discharge, will go to a SNF.  ZIMMERMAN,DONIELLE MPA-C 10/30/2013,7:46 AM    I have seen and examined the patient and agree with the assessment and plan as outlined.  Slow progress.  Will not increase coumadin dose yet as she's only received 2 doses so far.  Hold off on adding ACE-I for now as BP remains marginal.  Mobilize.  Diuresis.  Will reorder CBG's and SSI - unclear why they got stopped.   [name],[name] 10/30/2013 8:18 AM

## 2013-10-30 NOTE — Progress Notes (Signed)
Subjective:  Day 7 s/p CABG x3  Objective:   Vital Signs in the last 24 hours: Temp:  [97.9 F (36.6 C)-99.2 F (37.3 C)] 98.3 F (36.8 C) (03/05 0559) Pulse Rate:  [72-136] 72 (03/05 0559) Resp:  [16-18] 18 (03/05 0559) BP: (100-105)/(61-69) 100/63 mmHg (03/05 0559) SpO2:  [97 %-100 %] 98 % (03/05 0559) Weight:  [270 lb 4.5 oz (122.6 kg)] 270 lb 4.5 oz (122.6 kg) (03/05 0559)  Intake/Output from previous day: 03/04 0701 - 03/05 0700 In: 360 [P.O.:360] Out: -   Medications: . amiodarone  400 mg Oral BID  . aspirin EC  325 mg Oral Daily  . atorvastatin  20 mg Oral q1800  . bisacodyl  10 mg Oral Daily   Or  . bisacodyl  10 mg Rectal Daily  . docusate sodium  200 mg Oral Daily  . enoxaparin (LOVENOX) injection  30 mg Subcutaneous QHS  . feeding supplement (RESOURCE BREEZE)  1 Container Oral TID WC  . ferrous sulfate  325 mg Oral Q breakfast  . furosemide  80 mg Oral BID  . guaiFENesin  600 mg Oral BID  . insulin aspart  0-20 Units Subcutaneous TID WC  . levothyroxine  112 mcg Oral QAC breakfast  . metoprolol tartrate  25 mg Oral BID  . pantoprazole  40 mg Oral Daily  . potassium chloride  20 mEq Oral Daily  . prednisoLONE acetate  1 drop Right Eye Daily  . sodium chloride  3 mL Intravenous Q12H  . sodium chloride  3 mL Intravenous Q12H  . warfarin  5 mg Oral q1800  . Warfarin - Physician Dosing Inpatient   Does not apply q1800    . sodium chloride 20 mL/hr (10/29/13 [id number])    Physical Exam:   General appearance: alert, cooperative and no distress Neck: no JVD, supple, symmetrical, trachea midline and thyroid not enlarged, symmetric, no tenderness/mass/nodules Lungs: decreased BS; no wheezing Heart: regular rate and rhythm and 1/6 sem, no rub or s3 Abdomen: soft, non-tender; bowel sounds normal; no masses,  no organomegaly Extremities: 1+ pitting edema Neurologic: Grossly normal   Rate: 79  Rhythm: normal sinus rhythm  Lab Results:    Recent Labs  10/29/13 0550 10/30/13 0500  NA 138 139  K 4.3 4.5  CL 104 102  CO2 23 24  GLUCOSE 111* 105*  BUN 25* 28*  CREATININE 1.39* 1.54*   No results found for this basename: TROPONINI, CK, MB,  in the last 72 hours Hepatic Function Panel No results found for this basename: PROT, ALBUMIN, AST, ALT, ALKPHOS, BILITOT, BILIDIR, IBILI,  in the last 72 hours  Recent Labs  10/30/13 0500  INR 1.14   BNP (last 3 results)  Recent Labs  10/20/13 1632 10/21/13 0307 10/22/13 0229  PROBNP 3566.0* 12597.0* 13837.0*    Lipid Panel     Component Value Date/Time   CHOL 147 10/21/2013 0307   TRIG 69 10/21/2013 0307   HDL 83 10/21/2013 0307   CHOLHDL 1.8 10/21/2013 0307   VLDL 14 10/21/2013 0307   LDLCALC 50 10/21/2013 0307      Imaging:  Dg Chest 2 View  10/29/2013   CLINICAL DATA:  Shortness breath.  Weakness.  EXAM: CHEST  2 VIEW  COMPARISON:  Chest x-ray 10/27/2013.  FINDINGS: Left internal jugular Cordis with tip in the left innominate vein is unchanged. Lung volumes are low. Persistent opacity at the base of the left hemithorax is favored to reflect postoperative subsegmental atelectasis (  underlying airspace consolidation is difficult to exclude). There is also a superimposed moderate left pleural effusion. Linear opacity in the left mid lung is compatible with subsegmental atelectasis. No evidence of pulmonary edema. Cardiopericardial silhouette is mildly enlarged (unchanged). The patient is rotated to the left on today's exam, resulting in distortion of the mediastinal contours and reduced diagnostic sensitivity and specificity for mediastinal pathology. Atherosclerosis in the thoracic aorta. Status post median sternotomy for CABG. Epicardial pacing wires remain in position.  IMPRESSION: 1. Allowing for slight differences in patient positioning and technique, the radiographic appearance the chest is essentially unchanged, as detailed above.   Electronically Signed   By: Vinnie Langton M.D.    On: 10/29/2013 08:26   Dg Chest Port 1 View  10/29/2013   CLINICAL DATA:  PICC placement.  EXAM: PORTABLE CHEST - 1 VIEW  COMPARISON:  Chest x-ray 10/29/2013.  FINDINGS: There is a right upper extremity PICC with tip terminating in the distal superior vena cava. Left internal jugular central venous Cordis with tip in the left innominate vein. Bibasilar opacities (left greater than right) favored to predominantly reflect postoperative subsegmental atelectasis with superimposed moderate left and increasing small to moderate right pleural effusion, although underlying airspace consolidation is difficult to exclude. No pneumothorax. No evidence of pulmonary edema. Heart size appears mildly enlarged. Status post median sternotomy for CABG and left atrial appendage ligation. Atherosclerosis in the thoracic aorta.  IMPRESSION: 1. Support apparatus and postoperative changes, as above. 2. Worsening aeration throughout the lung bases bilaterally, which may simply reflect pleural effusions (increasing on the right) with associated postoperative atelectasis, although developing airspace consolidation from infection is difficult to exclude.   Electronically Signed   By: Vinnie Langton M.D.   On: 10/29/2013 15:37      Assessment/Plan:   Principal Problem:   S/P CABG x 3 Active Problems:   Type II diabetes mellitus with complication   Obesity   HYPERTENSION   Atrial fibrillation   Left bundle branch block (LBBB) on electrocardiogram   Acute pulmonary edema with congestive heart failure   Left main coronary artery disease   Acute myocardial infarction of anterior wall   Ischemic cardiomyopathy   Morbid obesity   Chronic kidney disease   I/O + 360 yesterday, + 5168 since admission.AF with RVR yesterday RX amiodarone bolus and increased lopressor. Now on amio 400 bid orally. BP [id number] systolic; Ace-I not yet started with marginal BP. Wt up 20 lbs since admission, but decreased by 6 lbs since 3/1; Continue  diuresis. INR 1.14. Getting coumadin. Cardiac Rehab.   Troy Sine, MD, [facility] 10/30/2013, 9:01 AM

## 2013-10-30 NOTE — Progress Notes (Signed)
Physical Therapy Treatment Patient Details Name: Elaine [name] MRN: [id number] DOB: 10/07/1940 Today's Date: 10/30/2013 Time: [id number] PT Time Calculation (min): 32 min  PT Assessment / Plan / Recommendation  History of Present Illness 73 y.o. adm 10/20/13 with several weeks of ongoing/persistent chest pressure and now shortness of breath and edema. Upon EMS arrival EKG showed atrial fibrillation with a left bundle branch block. Code STEMI was called. 2/26 underwent CABG x 3 with clipping of Lt atrial appendage. PMH of HTN, DM-2, obesity & former smoker   PT Comments   Pt was adm with dx listed above. Pt's linens were wet and we began tx with a transfer to toilet and stood to clean/change gowns. Pt ambulated today with RW and 2L O2, req 2 seated rest breaks Pt to benefit from continued skilled acute PT for functional mobility deficits.   Follow Up Recommendations  CIR;Supervision for mobility/OOB     Does the patient have the potential to tolerate intense rehabilitation     Barriers to Discharge        Equipment Recommendations       Recommendations for Other Services    Frequency Min 3X/week   Progress towards PT Goals Progress towards PT goals: Progressing toward goals  Plan Current plan remains appropriate    Precautions / Restrictions Precautions Precautions: Sternal;Fall Precaution Comments: pt stated that she knows her precautions but attempted to use B UE for sit to stand transfers. She req verbal cues to use pillow and not to use UE to push/pull Restrictions Weight Bearing Restrictions: No Other Position/Activity Restrictions: sternal precautions; pt able to state "no pushing" only   Pertinent Vitals/Pain Appropriate vitals were maintained and monitored. Pt reports SOB/fatigue c amb     Mobility  Bed Mobility General bed mobility comments: pt up in recliner Transfers Overall transfer level: Needs assistance Equipment used: Rolling walker (2 wheeled) (two person  assist) Transfers: Sit to/from [facility] Sit to Stand: +2 physical assistance;Mod assist (from chair) Stand pivot transfers: Mod assist;+2 physical assistance (from chair and toilet seat) General transfer comment: cues for pillow hug and no UE push/pull. x2 assist sit to stand from a lower seat position Ambulation/Gait Ambulation/Gait assistance: Min assist Ambulation Distance (Feet): 40 Feet Assistive device: Rolling walker (2 wheeled) Gait Pattern/deviations: Step-through pattern;Decreased stride length Gait velocity: cautious/guarding General Gait Details: pt req 2 seated rest breaks during amb. rest breaks were a bit lengthy 3-5 min so she could catch her breath    Exercises General Exercises - Lower Extremity Ankle Circles/Pumps: AROM;Strengthening;Both;Seated;10 reps Long Arc Quad: AROM;Strengthening;Both;10 reps;Seated Heel Raises: AROM;Strengthening;Both;5 reps;Seated   PT Diagnosis:    PT Problem List:   PT Treatment Interventions:     PT Goals (current goals can now be found in the care plan section) Acute Rehab PT Goals Patient Stated Goal: eventually return home to care for her husband with mild dementia PT Goal Formulation: With patient Potential to Achieve Goals: Good  Visit Information  Last PT Received On: 10/30/13 Assistance Needed: +2 History of Present Illness: 73 y.o. adm 10/20/13 with several weeks of ongoing/persistent chest pressure and now shortness of breath and edema. Upon EMS arrival EKG showed atrial fibrillation with a left bundle branch block. Code STEMI was called. 2/26 underwent CABG x 3 with clipping of Lt atrial appendage. PMH of HTN, DM-2, obesity & former smoker    Subjective Data  Patient Stated Goal: eventually return home to care for her husband with mild dementia   Cognition  Cognition Arousal/Alertness: Awake/alert Behavior During Therapy: WFL for tasks assessed/performed Overall Cognitive Status: Within Functional  Limits for tasks assessed Memory: Decreased recall of precautions    Balance  Balance Overall balance assessment: Needs assistance Sitting-balance support: No upper extremity supported Sitting balance-Leahy Scale: Fair Standing balance support: Bilateral upper extremity supported Standing balance-Leahy Scale: Poor General Comments General comments (skin integrity, edema, etc.): B LE edema  End of Session PT - End of Session Equipment Utilized During Treatment: Oxygen;Gait belt (RW) Activity Tolerance: Patient limited by fatigue (SOB) Patient left: in chair;with call bell/phone within reach Nurse Communication: Mobility status   GP     Eulises Kijowski 10/30/2013, 5:00 PM

## 2013-10-30 NOTE — Progress Notes (Signed)
Physical Therapy Treatment Patient Details Name: Elaine [name] MRN: [id number] DOB: [birth year] Today's Date: 10/30/2013 Time: [id number] PT Time Calculation (min): 32 min  PT Assessment / Plan / Recommendation  History of Present Illness 73 y.o. adm 10/20/13 with several weeks of ongoing/persistent chest pressure and now shortness of breath and edema. Upon EMS arrival EKG showed atrial fibrillation with a left bundle branch block. Code STEMI was called. 2/26 underwent CABG x 3 with clipping of Lt atrial appendage. PMH of HTN, DM-2, obesity & former smoker   PT Comments   Pt was adm with dx listed above. Pt's linens were wet and we began tx with a transfer to toilet and stood to clean/change gowns. Pt ambulated today with RW and 2L O2, req 2 seated rest breaks due to deconditioning. Continues to be a great candidate for CIR.   Follow Up Recommendations  CIR;Supervision for mobility/OOB     Does the patient have the potential to tolerate intense rehabilitation     Barriers to Discharge        Equipment Recommendations  Rolling walker with 5" wheels    Recommendations for Other Services    Frequency Min 3X/week   Progress towards PT Goals Progress towards PT goals: Progressing toward goals  Plan Current plan remains appropriate    Precautions / Restrictions Precautions Precautions: Sternal;Fall Precaution Comments: pt stated that she knows her precautions but attempted to use B UE for sit to stand transfers. She req verbal cues to use pillow and not to use UE to push/pull Restrictions Weight Bearing Restrictions: No Other Position/Activity Restrictions: sternal precautions; pt able to state "no pushing" only   Pertinent Vitals/Pain Appropriate vitals were maintained and monitored. Pt reports SOB/fatigue c amb     Mobility  Bed Mobility General bed mobility comments: pt up in recliner Transfers Overall transfer level: Needs assistance Equipment used: Rolling walker (2 wheeled) (two  person assist) Transfers: Sit to/from [facility] Sit to Stand: +2 physical assistance;Mod assist (from chair) Stand pivot transfers: Mod assist;+2 physical assistance (from chair and toilet seat) General transfer comment: cues for pillow hug and no UE push/pull. x2 assist sit to stand from a lower seat position; cues for hand placement and sequencing; performed sit to stand from various levels; ie recliner and BSC; relies heavily on armrests and utilizes anterior rocking technique to shift weight forward  Ambulation/Gait Ambulation/Gait assistance: Min assist Ambulation Distance (Feet): 60 Feet (with mulitiple rest breaks ) Assistive device: Rolling walker (2 wheeled) Gait Pattern/deviations: Step-through pattern;Decreased stride length Gait velocity: cautious/guarding Gait velocity interpretation: <1.8 ft/sec, indicative of risk for recurrent falls General Gait Details: pt req 2 seated rest breaks during amb. rest breaks were a bit lengthy 3-5 min so she could catch her breath; pt ambulating on 2L O2; cues for gt sequencing and upright posture     Exercises General Exercises - Lower Extremity Ankle Circles/Pumps: AROM;Strengthening;Both;Seated;10 reps Long Arc Quad: AROM;Strengthening;Both;10 reps;Seated Heel Raises: AROM;Strengthening;Both;5 reps;Seated   PT Diagnosis:    PT Problem List:   PT Treatment Interventions:     PT Goals (current goals can now be found in the care plan section) Acute Rehab PT Goals Patient Stated Goal: eventually return home to care for her husband with mild dementia PT Goal Formulation: With patient Potential to Achieve Goals: Good  Visit Information  Last PT Received On: 10/30/13 Assistance Needed: +2 History of Present Illness: 73 y.o. adm 10/20/13 with several weeks of ongoing/persistent chest pressure and now shortness of  breath and edema. Upon EMS arrival EKG showed atrial fibrillation with a left bundle branch block. Code STEMI was  called. 2/26 underwent CABG x 3 with clipping of Lt atrial appendage. PMH of HTN, DM-2, obesity & former smoker    Subjective Data  Subjective: pt requesting to use BSC prior to walking  Patient Stated Goal: eventually return home to care for her husband with mild dementia   Cognition  Cognition Arousal/Alertness: Awake/alert Behavior During Therapy: WFL for tasks assessed/performed Overall Cognitive Status: Within Functional Limits for tasks assessed    Balance  Balance Overall balance assessment: Needs assistance Sitting-balance support: No upper extremity supported Sitting balance-Leahy Scale: Fair Standing balance support: Bilateral upper extremity supported Standing balance-Leahy Scale: Poor Standing balance comment: requires bil UE support General Comments General comments (skin integrity, edema, etc.): B LE edema  End of Session PT - End of Session Equipment Utilized During Treatment: Oxygen;Gait belt (RW) Activity Tolerance: Patient limited by fatigue (SOB) Patient left: in chair;with call bell/phone within reach Nurse Communication: Mobility status   GP    Manley Mason, SPT [place], [place] 10/30/2013, 5:21 PM

## 2013-10-31 ENCOUNTER — Inpatient Hospital Stay (HOSPITAL_COMMUNITY): Payer: Medicare HMO

## 2013-10-31 LAB — GLUCOSE, CAPILLARY
GLUCOSE-CAPILLARY: 143 mg/dL — AB (ref 70–99)
Glucose-Capillary: 83 mg/dL (ref 70–99)
Glucose-Capillary: 97 mg/dL (ref 70–99)
Glucose-Capillary: 148 mg/dL — ABNORMAL HIGH (ref 70–99)
Glucose-Capillary: 154 mg/dL — ABNORMAL HIGH (ref 70–99)

## 2013-10-31 LAB — BASIC METABOLIC PANEL
BUN: 29 mg/dL — ABNORMAL HIGH (ref 6–23)
CALCIUM: 8.6 mg/dL (ref 8.4–10.5)
CHLORIDE: 101 meq/L (ref 96–112)
CO2: 24 meq/L (ref 19–32)
CREATININE: 1.6 mg/dL — AB (ref 0.50–1.10)
GFR calc non Af Amer: 31 mL/min — ABNORMAL LOW (ref >90)
GFR, EST AFRICAN AMERICAN: 36 mL/min — AB (ref >90)
Glucose, Bld: 91 mg/dL (ref 70–99)
Potassium: 4.8 mEq/L (ref 3.7–5.3)
SODIUM: 138 meq/L (ref 137–147)

## 2013-10-31 LAB — CBC
HCT: 22.6 % — ABNORMAL LOW (ref 36.0–46.0)
Hemoglobin: 7.7 g/dL — ABNORMAL LOW (ref 12.0–15.0)
MCH: 28.1 pg (ref 26.0–34.0)
MCHC: 34.1 g/dL (ref 30.0–36.0)
MCV: 82.5 fL (ref 78.0–100.0)
PLATELETS: 365 10*3/uL (ref 150–400)
RBC: 2.74 MIL/uL — AB (ref 3.87–5.11)
RDW: 16.4 % — ABNORMAL HIGH (ref 11.5–15.5)
WBC: 8.5 10*3/uL (ref 4.0–10.5)

## 2013-10-31 LAB — PROTIME-INR
INR: 1.37 (ref 0.00–1.49)
PROTHROMBIN TIME: 16.5 s — AB (ref 11.6–15.2)

## 2013-10-31 MED ORDER — FUROSEMIDE 80 MG PO TABS
80.0000 mg | ORAL_TABLET | Freq: Two times a day (BID) | ORAL | Status: DC
  Filled 2013-10-31: qty 1

## 2013-10-31 MED ORDER — FUROSEMIDE 10 MG/ML IJ SOLN: 40.0000 mg | Freq: Four times a day (QID) | INTRAMUSCULAR | Status: DC

## 2013-10-31 MED ORDER — FUROSEMIDE 10 MG/ML IJ SOLN
40.0000 mg | Freq: Once | INTRAMUSCULAR | Status: AC
  Administered 2013-10-31: 40 mg via INTRAVENOUS

## 2013-10-31 MED ORDER — FUROSEMIDE 80 MG PO TABS
80.0000 mg | ORAL_TABLET | Freq: Two times a day (BID) | ORAL | Status: DC
Start: 2013-11-02 — End: 2013-11-05
  Administered 2013-11-02 – 2013-11-05 (×6): 80 mg via ORAL
  Filled 2013-10-31 (×9): qty 1

## 2013-10-31 MED ORDER — FUROSEMIDE 10 MG/ML IJ SOLN
40.0000 mg | Freq: Four times a day (QID) | INTRAMUSCULAR | Status: AC
  Administered 2013-11-01 (×3): 40 mg via INTRAVENOUS
  Filled 2013-10-31 (×3): qty 4

## 2013-10-31 NOTE — Progress Notes (Addendum)
[address]       [place] [address]             [phone]      8 Days Post-Op  Procedure(s) (LRB): CORONARY ARTERY BYPASS GRAFTING (CABG) (N/A) INTRAOPERATIVE TRANSESOPHAGEAL ECHOCARDIOGRAM (N/A) CLIPPING OF ATRIAL APPENDAGE (N/A) Subjective: Feels better  Objective  Telemetry sinus rhythm  Temp:  [98.3 F (36.8 C)-98.6 F (37 C)] 98.6 F (37 C) (03/06 0443) Pulse Rate:  [68-80] 70 (03/06 0443) Resp:  [18-20] 18 (03/06 0443) BP: (106-124)/(48-80) 122/80 mmHg (03/06 0443) SpO2:  [96 %-100 %] 98 % (03/06 0443) Weight:  [268 lb 11.9 oz (121.9 kg)] 268 lb 11.9 oz (121.9 kg) (03/06 0443)   Intake/Output Summary (Last 24 hours) at 10/31/13 0851 Last data filed at 10/30/13 1700  Gross per 24 hour  Intake    480 ml  Output      0 ml  Net    480 ml       General appearance: alert, cooperative and no distress Heart: regular rate and rhythm Lungs: tubular in left base Abdomen: soft, nontender Extremities: + edema Wound: incis healing well  Lab Results:  Recent Labs  10/30/13 0500 10/31/13 0520  NA 139 138  K 4.5 4.8  CL 102 101  CO2 24 24  GLUCOSE 105* 91  BUN 28* 29*  CREATININE 1.54* 1.60*  CALCIUM 8.4 8.6   No results found for this basename: AST, ALT, ALKPHOS, BILITOT, PROT, ALBUMIN,  in the last 72 hours No results found for this basename: LIPASE, AMYLASE,  in the last 72 hours  Recent Labs  10/29/13 0550 10/31/13 0520  WBC 8.3 8.5  HGB 8.4* 7.7*  HCT 24.9* 22.6*  MCV 81.6 82.5  PLT 279 365   No results found for this basename: CKTOTAL, CKMB, TROPONINI,  in the last 72 hours No components found with this basename: POCBNP,  No results found for this basename: DDIMER,  in the last 72 hours No results found for this basename: HGBA1C,  in the last 72 hours No results found for this basename: CHOL, HDL, LDLCALC, TRIG, CHOLHDL,  in the last 72 hours No results found for this basename: TSH, T4TOTAL, FREET3, T3FREE,  THYROIDAB,  in the last 72 hours No results found for this basename: VITAMINB12, FOLATE, FERRITIN, TIBC, IRON, RETICCTPCT,  in the last 72 hours  Medications: Scheduled . amiodarone  400 mg Oral BID  . aspirin EC  325 mg Oral Daily  . atorvastatin  20 mg Oral q1800  . bisacodyl  10 mg Oral Daily   Or  . bisacodyl  10 mg Rectal Daily  . docusate sodium  200 mg Oral Daily  . enoxaparin (LOVENOX) injection  30 mg Subcutaneous QHS  . feeding supplement (RESOURCE BREEZE)  1 Container Oral TID WC  . ferrous sulfate  325 mg Oral Q breakfast  . furosemide  80 mg Oral BID  . guaiFENesin  600 mg Oral BID  . insulin aspart  0-20 Units Subcutaneous TID WC  . levothyroxine  112 mcg Oral QAC breakfast  . metoprolol tartrate  25 mg Oral BID  . pantoprazole  40 mg Oral Daily  . potassium chloride  20 mEq Oral Daily  . prednisoLONE acetate  1 drop Right Eye Daily  . sodium chloride  3 mL Intravenous Q12H  . sodium chloride  3 mL Intravenous Q12H  . warfarin  5 mg Oral q1800  . Warfarin - Physician  Dosing Inpatient   Does not apply q1800     Radiology/Studies:  Dg Chest Port 1 View  10/29/2013   CLINICAL DATA:  PICC placement.  EXAM: PORTABLE CHEST - 1 VIEW  COMPARISON:  Chest x-ray 10/29/2013.  FINDINGS: There is a right upper extremity PICC with tip terminating in the distal superior vena cava. Left internal jugular central venous Cordis with tip in the left innominate vein. Bibasilar opacities (left greater than right) favored to predominantly reflect postoperative subsegmental atelectasis with superimposed moderate left and increasing small to moderate right pleural effusion, although underlying airspace consolidation is difficult to exclude. No pneumothorax. No evidence of pulmonary edema. Heart size appears mildly enlarged. Status post median sternotomy for CABG and left atrial appendage ligation. Atherosclerosis in the thoracic aorta.  IMPRESSION: 1. Support apparatus and postoperative changes, as  above. 2. Worsening aeration throughout the lung bases bilaterally, which may simply reflect pleural effusions (increasing on the right) with associated postoperative atelectasis, although developing airspace consolidation from infection is difficult to exclude.   Electronically Signed   By: Vinnie Langton M.D.   On: 10/29/2013 15:37    INR: Will add last result for INR, ABG once components are confirmed Will add last 4 CBG results once components are confirmed  Assessment/Plan: S/P Procedure(s) (LRB): CORONARY ARTERY BYPASS GRAFTING (CABG) (N/A) INTRAOPERATIVE TRANSESOPHAGEAL ECHOCARDIOGRAM (N/A) CLIPPING OF ATRIAL APPENDAGE (N/A)  1 remaining in sinus rhythm 2 back on O2 this am, some air hunger. Will repeat CXR to evaluate effusions as may need thoracentesis on left. Cont to diurese for volume overload. Creat pretty stable 1.54>>>1.6 3 borderline H/H for consideration of transfusion. HGB now 7.7 4 almost ready for SNF/CIR    LOS: 11 days    GOLD,WAYNE E 3/6/20158:51 AM  I have seen and examined the patient and agree with the assessment and plan as outlined.  Still 8 kg above baseline weight.  Will give lasix IV this afternoon and tomorrow.  Check pro-BNP and recheck Hgb/Hct in am.  May need to transfuse if anemia gets any worse.  [name],[name] 10/31/2013 2:04 PM

## 2013-10-31 NOTE — Progress Notes (Signed)
Subjective:  Day 8 s/p CAGB x 3  Objective:   Vital Signs in the last 24 hours: Temp:  [98.3 F (36.8 C)-98.6 F (37 C)] 98.6 F (37 C) (03/06 0443) Pulse Rate:  [68-80] 70 (03/06 0443) Resp:  [18] 18 (03/06 0443) BP: (109-124)/(54-80) 122/80 mmHg (03/06 0443) SpO2:  [96 %-98 %] 98 % (03/06 0443) Weight:  [268 lb 11.9 oz (121.9 kg)] 268 lb 11.9 oz (121.9 kg) (03/06 0443)  Intake/Output from previous day: 03/05 0701 - 03/06 0700 In: 600 [P.O.:600] Out: -   Medications: . amiodarone  400 mg Oral BID  . aspirin EC  325 mg Oral Daily  . atorvastatin  20 mg Oral q1800  . bisacodyl  10 mg Oral Daily   Or  . bisacodyl  10 mg Rectal Daily  . docusate sodium  200 mg Oral Daily  . enoxaparin (LOVENOX) injection  30 mg Subcutaneous QHS  . feeding supplement (RESOURCE BREEZE)  1 Container Oral TID WC  . ferrous sulfate  325 mg Oral Q breakfast  . furosemide  80 mg Oral BID  . guaiFENesin  600 mg Oral BID  . insulin aspart  0-20 Units Subcutaneous TID WC  . levothyroxine  112 mcg Oral QAC breakfast  . metoprolol tartrate  25 mg Oral BID  . pantoprazole  40 mg Oral Daily  . potassium chloride  20 mEq Oral Daily  . prednisoLONE acetate  1 drop Right Eye Daily  . sodium chloride  3 mL Intravenous Q12H  . sodium chloride  3 mL Intravenous Q12H  . warfarin  5 mg Oral q1800  . Warfarin - Physician Dosing Inpatient   Does not apply q1800    . sodium chloride 20 mL/hr (10/29/13 [id number])    Physical Exam:   General appearance: alert, cooperative and no distress Neck: no adenopathy, no JVD, supple, symmetrical, trachea midline and thyroid not enlarged, symmetric, no tenderness/mass/nodules Lungs: diminished breath sounds bibasilar; no wheezing Heart: regular rate and rhythm and 1/6 sem Abdomen: soft, non-tender; bowel sounds normal; no masses,  no organomegaly Extremities: edema bilateral 1+ LE Neurologic: Grossly normal   Rate: 72  Rhythm: normal sinus rhythm  Lab  Results:    Recent Labs  10/30/13 0500 10/31/13 0520  NA 139 138  K 4.5 4.8  CL 102 101  CO2 24 24  GLUCOSE 105* 91  BUN 28* 29*  CREATININE 1.54* 1.60*   No results found for this basename: TROPONINI, CK, MB,  in the last 72 hours Hepatic Function Panel No results found for this basename: PROT, ALBUMIN, AST, ALT, ALKPHOS, BILITOT, BILIDIR, IBILI,  in the last 72 hours  Recent Labs  10/31/13 0520  INR 1.37   BNP (last 3 results)  Recent Labs  10/20/13 1632 10/21/13 0307 10/22/13 0229  PROBNP 3566.0* 12597.0* 13837.0*    Lipid Panel     Component Value Date/Time   CHOL 147 10/21/2013 0307   TRIG 69 10/21/2013 0307   HDL 83 10/21/2013 0307   CHOLHDL 1.8 10/21/2013 0307   VLDL 14 10/21/2013 0307   LDLCALC 50 10/21/2013 0307      Imaging:  Dg Chest Port 1 View  10/29/2013   CLINICAL DATA:  PICC placement.  EXAM: PORTABLE CHEST - 1 VIEW  COMPARISON:  Chest x-ray 10/29/2013.  FINDINGS: There is a right upper extremity PICC with tip terminating in the distal superior vena cava. Left internal jugular central venous Cordis with tip in the left innominate vein. Bibasilar  opacities (left greater than right) favored to predominantly reflect postoperative subsegmental atelectasis with superimposed moderate left and increasing small to moderate right pleural effusion, although underlying airspace consolidation is difficult to exclude. No pneumothorax. No evidence of pulmonary edema. Heart size appears mildly enlarged. Status post median sternotomy for CABG and left atrial appendage ligation. Atherosclerosis in the thoracic aorta.  IMPRESSION: 1. Support apparatus and postoperative changes, as above. 2. Worsening aeration throughout the lung bases bilaterally, which may simply reflect pleural effusions (increasing on the right) with associated postoperative atelectasis, although developing airspace consolidation from infection is difficult to exclude.   Electronically Signed   By: Vinnie Langton M.D.   On: 10/29/2013 15:37      Assessment/Plan:   Principal Problem:   S/P CABG x 3 Active Problems:   Type II diabetes mellitus with complication   Obesity   HYPERTENSION   Atrial fibrillation   Left bundle branch block (LBBB) on electrocardiogram   Acute pulmonary edema with congestive heart failure   Left main coronary artery disease   Acute myocardial infarction of anterior wall   Ischemic cardiomyopathy   Morbid obesity   Chronic kidney disease   I/O +600 yesterday; +6008 since admission. Lost 2 lbs since yesterday, now 268, but still up 19 lbs from pre-op. Currently  on lasix 80 mg bid. Maintaining sinus rhythm.   Troy Sine, MD, [facility] 10/31/2013, 10:53 AM

## 2013-10-31 NOTE — Progress Notes (Signed)
Rehab admissions - I met with pt and her husband and explained the possibility of inpatient rehab. Informational brochures given and questions answered. Pt is interested in pursuing inpatient rehab and would like to me to open the case with her [employer] insurance.  I will now seek insurance authorization and will keep the pt and medical team updated on progress. I also left a message with pt's son Steffon per pt request and shared a brief update.  Please call me with any questions. Thanks.  Janine Turnington, PT Rehabilitation Admissions Coordinator [phone]   

## 2013-10-31 NOTE — Progress Notes (Signed)
Occupational Therapy Treatment Patient Details Name: Elaine [name] MRN: [id number] DOB: 05-Feb-1941 Today's Date: 10/31/2013 Time: 1030-1055 OT Time Calculation (min): 25 min  OT Assessment / Plan / Recommendation  History of present illness 73 y.o. adm 10/20/13 with several weeks of ongoing/persistent chest pressure and now shortness of breath and edema. Upon EMS arrival EKG showed atrial fibrillation with a left bundle branch block. Code STEMI was called. 2/26 underwent CABG x 3 with clipping of Lt atrial appendage. PMH of HTN, DM-2, obesity & former smoker   OT comments  Pt slowly improving with all adls and adl transfers.  Pt cont to need cues for sternal precautions and becomes fatigued adn SOB very quickly.    Follow Up Recommendations  CIR;Other (comment)    Barriers to Discharge       Equipment Recommendations       Recommendations for Other Services    Frequency Min 2X/week   Progress towards OT Goals Progress towards OT goals: Progressing toward goals  Plan Discharge plan remains appropriate    Precautions / Restrictions Precautions Precautions: Sternal;Fall Precaution Comments: pt continues to attempt to push up with L hand when standing despite cues to follow sternal precautions Restrictions Weight Bearing Restrictions: No Other Position/Activity Restrictions: sternal precautions; pt able to state "no pushing" only   Pertinent Vitals/Pain Pt was off of O2 from part of session and remained at 96% O2 sat.  O2 reapplied at end of session.  Pt with a great amount of swelling in BLEs.    ADL  Eating/Feeding: Performed;Independent Where Assessed - Eating/Feeding: Chair Grooming: Performed;Wash/dry hands;Wash/dry face;Supervision/safety Where Assessed - Grooming: Supported standing Lower Body Dressing: Performed;Maximal assistance Where Assessed - Lower Body Dressing: Supported sit to Lobbyist: Performed;Minimal Print production planner Method: Sit to  Loss adjuster, chartered: Therapist, occupational and Hygiene: Performed;Minimal assistance Where Assessed - Best boy and Hygiene: Standing Equipment Used: Rolling walker;Other (comment) Transfers/Ambulation Related to ADLs: Continued cues provided to for sternal precautions.  Pt requires several rest breaks when standing due to poor endurance. ADL Comments: Pt doing better with adls. Pt has increased I reaching feet if allowed to dress self on side of bed.  Still may need AE.    OT Diagnosis:    OT Problem List:   OT Treatment Interventions:     OT Goals(current goals can now be found in the care plan section) Acute Rehab OT Goals Patient Stated Goal: eventually return home to care for her husband with mild dementia OT Goal Formulation: With patient/family Time For Goal Achievement: 11/06/13 Potential to Achieve Goals: Good ADL Goals Pt Will Perform Grooming: with supervision;with set-up;standing Pt Will Perform Lower Body Bathing: with mod assist;with adaptive equipment;sitting/lateral leans;sit to/from stand Pt Will Perform Lower Body Dressing: with max assist;with mod assist;with adaptive equipment;sitting/lateral leans;sit to/from stand Pt Will Transfer to Toilet: with min assist;with min guard assist;bedside commode;grab bars;ambulating;regular height toilet Pt Will Perform Toileting - Clothing Manipulation and hygiene: with min assist;sitting/lateral leans;sit to/from stand Pt Will Perform Tub/Shower Transfer: with min assist;tub bench  Visit Information  Last OT Received On: 10/31/13 Assistance Needed: +1 History of Present Illness: 73 y.o. adm 10/20/13 with several weeks of ongoing/persistent chest pressure and now shortness of breath and edema. Upon EMS arrival EKG showed atrial fibrillation with a left bundle branch block. Code STEMI was called. 2/26 underwent CABG x 3 with clipping of Lt atrial appendage. PMH of HTN,  DM-2, obesity & former smoker  Subjective Data      Prior Functioning       Cognition  Cognition Arousal/Alertness: Awake/alert Behavior During Therapy: WFL for tasks assessed/performed Overall Cognitive Status: Within Functional Limits for tasks assessed Memory: Decreased recall of precautions    Mobility  Bed Mobility General bed mobility comments: pt up in recliner Transfers Overall transfer level: Needs assistance Equipment used: Rolling walker (2 wheeled) Transfers: Sit to/from [facility] Sit to Stand: Mod assist;Min assist (mod assist from low surface; min from high) Stand pivot transfers: Mod assist;Min assist (mod from low surface; min from high) General transfer comment: cues for pillow hug and no UE push/pull. x2 assist sit to stand from a lower seat position; cues for hand placement and sequencing; performed sit to stand from various levels; ie recliner and BSC; relies heavily on armrests and utilizes anterior rocking technique to shift weight forward     Exercises      Balance Balance Overall balance assessment: Needs assistance Sitting-balance support: Feet supported Sitting balance-Leahy Scale: Good Standing balance support: Bilateral upper extremity supported;During functional activity Standing balance-Leahy Scale: Poor Standing balance comment: Pt continues to require the walker to support self in standing  End of Session OT - End of Session Equipment Utilized During Treatment: Other (comment);Rolling walker Activity Tolerance: Patient limited by fatigue Patient left: in chair;with call bell/phone within reach;with family/visitor present Nurse Communication: Mobility status  GO     Glenford Peers 10/31/2013, 11:05 AM [phone]

## 2013-10-31 NOTE — Progress Notes (Signed)
CARDIAC REHAB PHASE I   PRE:  Rate/Rhythm: 70 SR    BP: sitting 115/52    SaO2: 99 2L  MODE:  Ambulation: 135 ft   POST:  Rate/Rhythm: 86 SR    BP: sitting 136/62     SaO2: 96 2L   Pt used rollator and 2L O2, assist x2. Continues to be DOE and diaphoretic with activity. Needed x4 rest stops (4-5 min each) due to SOB and diaphoresis. Feet are more swollen today and pt sts her shoes made them sore. Will f/u. Progressing slowly. [id number] Elaine [name] [place] CES, ACSM 10/31/2013 10:24 AM

## 2013-10-31 NOTE — Care Management Note (Signed)
    Page 1 of 2   11/06/2013     2:12:41 PM   CARE MANAGEMENT NOTE 11/06/2013  Patient:  Elaine [name], Elaine [name]   Account Number:  [id number]  Date Initiated:  10/24/2013  Documentation initiated by:  MAYO,HENRIETTA  Subjective/Objective Assessment:   CABG 2/26    PCP  Dr Viona Gilmore Lutricia Feil     Action/Plan:   Anticipated DC Date:  11/05/2013   Anticipated DC Plan:  IP REHAB FACILITY  In-house referral  Clinical Social Worker      DC Planning Services  CM consult      Choice offered to / List presented to:             Status of service:  Completed, signed off Medicare Important Message given?   (If response is "NO", the following Medicare IM given date fields will be blank) Date Medicare IM given:   Date Additional Medicare IM given:    Discharge Disposition:  IP REHAB FACILITY  Per UR Regulation:  Reviewed for med. necessity/level of care/duration of stay  If discussed at [place] of Stay Meetings, dates discussed:   10/28/2013  11/04/2013    Comments:  11/05/13 Adison Jerger,RN,BSN [id number] PT DISCHARGED TO [facility] TODAY.  10/31/13 Nguyen Todorov,RN,BSN [id number] P.T/O.T. RECOMMENDING IP REHAB PRIOR TO [place]; CONSULT IN PROGRESS.  PT WITH SLOW PROGRESSION; WOULD BENEFIT FROM SHORT TERM REHAB.  10/28/13 [place] RN MSN BSN CCM PT recommends home health but pt has decided to go to her dtr's home and will need to be able to negotiate steps.  PT working with pt now and requests CM wait until she re-evaluates - pt may need SNF for rehab.  10/24/13 [place] RN MSN BSN CCM Per RN, son lives in [place] but is currently in town and states he plans to stay to assist as needed when pt is discharged.  Pt also has dtrs that are local.

## 2013-11-01 LAB — PRO B NATRIURETIC PEPTIDE: Pro B Natriuretic peptide (BNP): 10094 pg/mL — ABNORMAL HIGH (ref 0–125)

## 2013-11-01 LAB — CBC
HCT: 23 % — ABNORMAL LOW (ref 36.0–46.0)
HEMOGLOBIN: 7.8 g/dL — AB (ref 12.0–15.0)
MCH: 28.4 pg (ref 26.0–34.0)
MCHC: 33.9 g/dL (ref 30.0–36.0)
MCV: 83.6 fL (ref 78.0–100.0)
Platelets: 380 10*3/uL (ref 150–400)
RBC: 2.75 MIL/uL — ABNORMAL LOW (ref 3.87–5.11)
RDW: 16.6 % — AB (ref 11.5–15.5)
WBC: 8.2 10*3/uL (ref 4.0–10.5)

## 2013-11-01 LAB — GLUCOSE, CAPILLARY
Glucose-Capillary: 154 mg/dL — ABNORMAL HIGH (ref 70–99)
Glucose-Capillary: 114 mg/dL — ABNORMAL HIGH (ref 70–99)
Glucose-Capillary: 179 mg/dL — ABNORMAL HIGH (ref 70–99)

## 2013-11-01 LAB — BASIC METABOLIC PANEL
BUN: 29 mg/dL — ABNORMAL HIGH (ref 6–23)
CHLORIDE: 100 meq/L (ref 96–112)
CO2: 25 mEq/L (ref 19–32)
Calcium: 8.4 mg/dL (ref 8.4–10.5)
Creatinine, Ser: 1.58 mg/dL — ABNORMAL HIGH (ref 0.50–1.10)
GFR calc non Af Amer: 31 mL/min — ABNORMAL LOW (ref >90)
GFR, EST AFRICAN AMERICAN: 36 mL/min — AB (ref >90)
Glucose, Bld: 126 mg/dL — ABNORMAL HIGH (ref 70–99)
POTASSIUM: 4.6 meq/L (ref 3.7–5.3)
Sodium: 136 mEq/L — ABNORMAL LOW (ref 137–147)

## 2013-11-01 LAB — PROTIME-INR
INR: 1.43 (ref 0.00–1.49)
Prothrombin Time: 17.1 seconds — ABNORMAL HIGH (ref 11.6–15.2)

## 2013-11-01 NOTE — Progress Notes (Signed)
Subjective:  Day 9 s/p CAGB x 3  Objective:   Vital Signs in the last 24 hours: Temp:  [98.3 F (36.8 C)-98.6 F (37 C)] 98.5 F (36.9 C) (03/07 0401) Pulse Rate:  [65-70] 70 (03/07 0401) Resp:  [17-18] 18 (03/07 0401) BP: (99-124)/(46-79) 124/79 mmHg (03/07 0401) SpO2:  [95 %-100 %] 95 % (03/07 0401) Weight:  [266 lb 15.6 oz (121.1 kg)] 266 lb 15.6 oz (121.1 kg) (03/07 0401)  Intake/Output from previous day: 03/06 0701 - 03/07 0700 In: 600 [P.O.:600] Out: 800 [Urine:800]  Medications: . amiodarone  400 mg Oral BID  . aspirin EC  325 mg Oral Daily  . atorvastatin  20 mg Oral q1800  . bisacodyl  10 mg Oral Daily   Or  . bisacodyl  10 mg Rectal Daily  . docusate sodium  200 mg Oral Daily  . enoxaparin (LOVENOX) injection  30 mg Subcutaneous QHS  . feeding supplement (RESOURCE BREEZE)  1 Container Oral TID WC  . ferrous sulfate  325 mg Oral Q breakfast  . furosemide  40 mg Intravenous Q6H  . [START ON 11/02/2013] furosemide  80 mg Oral BID  . guaiFENesin  600 mg Oral BID  . insulin aspart  0-20 Units Subcutaneous TID WC  . levothyroxine  112 mcg Oral QAC breakfast  . metoprolol tartrate  25 mg Oral BID  . pantoprazole  40 mg Oral Daily  . potassium chloride  20 mEq Oral Daily  . prednisoLONE acetate  1 drop Right Eye Daily  . sodium chloride  3 mL Intravenous Q12H  . sodium chloride  3 mL Intravenous Q12H  . warfarin  5 mg Oral q1800  . Warfarin - Physician Dosing Inpatient   Does not apply q1800    . sodium chloride 20 mL/hr (10/29/13 [id number])    Physical Exam:   General appearance: alert, cooperative and no distress Neck: no adenopathy, no JVD, supple, symmetrical, trachea midline and thyroid not enlarged, symmetric, no tenderness/mass/nodules Lungs: diminished breath sounds bibasilar; no wheezing Heart: regular rate and rhythm and 1/6 sem Abdomen: soft, non-tender; bowel sounds normal; no masses,  no organomegaly Extremities: edema bilateral 1+ LE Neurologic:  Grossly normal   Rate: 72  Rhythm:  NSR \  Lab Results:     Recent Labs  10/31/13 0520 11/01/13 0501  NA 138 136*  K 4.8 4.6  CL 101 100  CO2 24 25  GLUCOSE 91 126*  BUN 29* 29*  CREATININE 1.60* 1.58*   No results found for this basename: TROPONINI, CK, MB,  in the last 72 hours Hepatic Function Panel No results found for this basename: PROT, ALBUMIN, AST, ALT, ALKPHOS, BILITOT, BILIDIR, IBILI,  in the last 72 hours  Recent Labs  11/01/13 0501  INR 1.43   BNP (last 3 results)  Recent Labs  10/21/13 0307 10/22/13 0229 11/01/13 0501  PROBNP 12597.0* 13837.0* 10094.0*    Lipid Panel     Component Value Date/Time   CHOL 147 10/21/2013 0307   TRIG 69 10/21/2013 0307   HDL 83 10/21/2013 0307   CHOLHDL 1.8 10/21/2013 0307   VLDL 14 10/21/2013 0307   LDLCALC 50 10/21/2013 0307      Imaging:  Dg Chest 2 View  10/31/2013   CLINICAL DATA:  Status post surgery.  EXAM: CHEST  2 VIEW  COMPARISON:  DG CHEST 1V PORT dated 10/29/2013; DG CHEST 2 VIEW dated 10/29/2013; DG CHEST 1V PORT dated 10/27/2013; DG CHEST 1V PORT dated 10/25/2013  FINDINGS: There is a right-sided PICC line with the tip projecting over the SVC. There has been interval removal of the left jugular sheath.  There is left basilar pleural parenchymal disease unchanged from the prior exam likely reflecting pleural fluid and atelectasis. There is a trace right pleural effusion. There is no overt congestive failure. Stable cardiomediastinal silhouette. Changes from CABG.  The osseous structures are unremarkable.  IMPRESSION: Persistent left basilar pleural parenchymal disease unchanged the prior exam likely reflecting a combination of pleural fluid and atelectasis consistent with postoperative changes. Underlying infection cannot be excluded.   Electronically Signed   By: Kathreen Devoid   On: 10/31/2013 14:52      Assessment/Plan:   Principal Problem:   S/P CABG x 3 Active Problems:   Type II diabetes mellitus with  complication   Obesity   HYPERTENSION   Atrial fibrillation   Left bundle branch block (LBBB) on electrocardiogram   Acute pulmonary edema with congestive heart failure   Left main coronary artery disease   Acute myocardial infarction of anterior wall   Ischemic cardiomyopathy   Morbid obesity   Chronic kidney disease   1. CAD:  S/p CABG, slow progress.  She was up walking earlier today - still requiring lots of assistance. Continue metoprolol Getting IV lasix Slow progress   2. Anemia:  Hb = 7.8 . Getting iron supplement.  3.  Atrial Fib:  In NSR , on amio, warfarin  4. Hypothyroidism:      Ramond Dial., MD, [facility] 11/01/2013, 11:11 AM Office - [phone] Pager [phone]

## 2013-11-01 NOTE — Progress Notes (Signed)
CARDIAC REHAB PHASE I   PRE:  Rate/Rhythm: 68SR  BP:  Supine:   Sitting: 121/44  Standing:    SaO2: 100% 2L  MODE:  Ambulation: 140 ft   POST:  Rate/Rhythm: 82 SR  BP:  Supine:   Sitting: 132/60  Standing:    SaO2: 89-93% 2L 1000-1050 Pt walked 140 ft on 2L with rollator and asst x 2. Pt sat three times to rest. Very DOE. Pt states she is full of fluid. Takes pt quite a few minutes to recover her breathing with each rest stop. Motivated. To recliner with call bell. Encouraged walks with staff.   Graylon Good, RN BSN  11/01/2013 10:45 AM

## 2013-11-01 NOTE — Progress Notes (Addendum)
       [address]       [place] [address]             [phone]          9 Days Post-Op Procedure(s) (LRB): CORONARY ARTERY BYPASS GRAFTING (CABG) (N/A) INTRAOPERATIVE TRANSESOPHAGEAL ECHOCARDIOGRAM (N/A) CLIPPING OF ATRIAL APPENDAGE (N/A)  Subjective: Feels better today, breathing improved.  +BM this morning.   Objective: Vital signs in last 24 hours: Patient Vitals for the past 24 hrs:  BP Temp Temp src Pulse Resp SpO2 Weight  11/01/13 0401 124/79 mmHg 98.5 F (36.9 C) Oral 70 18 95 % 266 lb 15.6 oz (121.1 kg)  10/31/13 1957 99/55 mmHg 98.3 F (36.8 C) Oral 67 18 100 % -  10/31/13 1345 102/46 mmHg 98.6 F (37 C) Oral 65 17 99 % -   Current Weight  11/01/13 266 lb 15.6 oz (121.1 kg)  BASELINE WEIGHT: 113 kg    Intake/Output from previous day: 03/06 0701 - 03/07 0700 In: 600 [P.O.:600] Out: 800 [Urine:800]  CBGs 148-154-128   PHYSICAL EXAM:  Heart: RRR Lungs: Decreased BS on bases bilaterally Wound: Clean and dry Extremities: + LE edema    Lab Results: CBC: Recent Labs  10/31/13 0520 11/01/13 0501  WBC 8.5 8.2  HGB 7.7* 7.8*  HCT 22.6* 23.0*  PLT 365 380   BMET:  Recent Labs  10/31/13 0520 11/01/13 0501  NA 138 136*  K 4.8 4.6  CL 101 100  CO2 24 25  GLUCOSE 91 126*  BUN 29* 29*  CREATININE 1.60* 1.58*  CALCIUM 8.6 8.4    PT/INR:  Recent Labs  11/01/13 0501  LABPROT 17.1*  INR 1.43   Pro-BNP: 10094  CXR: (3/6) FINDINGS:  There is a right-sided PICC line with the tip projecting over the  SVC. There has been interval removal of the left jugular sheath.  There is left basilar pleural parenchymal disease unchanged from the  prior exam likely reflecting pleural fluid and atelectasis. There is  a trace right pleural effusion. There is no overt congestive  failure. Stable cardiomediastinal silhouette. Changes from CABG.  The osseous structures are unremarkable.  IMPRESSION:  Persistent left basilar pleural  parenchymal disease unchanged the  prior exam likely reflecting a combination of pleural fluid and  atelectasis consistent with postoperative changes. Underlying  infection cannot be excluded.    Assessment/Plan: S/P Procedure(s) (LRB): CORONARY ARTERY BYPASS GRAFTING (CABG) (N/A) INTRAOPERATIVE TRANSESOPHAGEAL ECHOCARDIOGRAM (N/A) CLIPPING OF ATRIAL APPENDAGE (N/A)  CV- Maintaining SR, BPs stable. Continue current meds.  Vol overload- continue IV Lasix today. pBNP remains elevated.   Pulm- symptomatically improved with diuresis. CXR stable.  DM- sugars generally stable. Was on Metformin, Byetta at home, presently on SSI.  Will continue to hold Metformin in light of elevated Cr.  Mild RI- Cr stable, trending down. Monitor. Not on ACE-I or metformin.  Expected postop blood loss anemia- H/H stable.  Will hold off on transfusion for now and continue Fe. Pt tolerating well.  Deconditioning- PT/CRPI.  Disp- CIR vs SNF when medically stable.     LOS: 12 days    COLLINS,GINA H 11/01/2013  I have seen and examined the patient and agree with the assessment and plan as outlined.  Cambren Helm H 11/01/2013 1:02 PM

## 2013-11-01 NOTE — Progress Notes (Signed)
Unable to measure urine output this shift due to Pts incontinence.

## 2013-11-02 LAB — GLUCOSE, CAPILLARY
GLUCOSE-CAPILLARY: 234 mg/dL — AB (ref 70–99)
Glucose-Capillary: 130 mg/dL — ABNORMAL HIGH (ref 70–99)
Glucose-Capillary: 93 mg/dL (ref 70–99)
Glucose-Capillary: 137 mg/dL — ABNORMAL HIGH (ref 70–99)

## 2013-11-02 LAB — PREPARE RBC (CROSSMATCH)

## 2013-11-02 LAB — CBC
HCT: 22.4 % — ABNORMAL LOW (ref 36.0–46.0)
Hemoglobin: 7.7 g/dL — ABNORMAL LOW (ref 12.0–15.0)
MCH: 28.5 pg (ref 26.0–34.0)
MCHC: 34.4 g/dL (ref 30.0–36.0)
MCV: 83 fL (ref 78.0–100.0)
Platelets: 433 10*3/uL — ABNORMAL HIGH (ref 150–400)
RBC: 2.7 MIL/uL — AB (ref 3.87–5.11)
RDW: 16.7 % — AB (ref 11.5–15.5)
WBC: 8.9 10*3/uL (ref 4.0–10.5)

## 2013-11-02 LAB — PROTIME-INR
INR: 1.93 — ABNORMAL HIGH (ref 0.00–1.49)
PROTHROMBIN TIME: 21.5 s — AB (ref 11.6–15.2)

## 2013-11-02 LAB — BASIC METABOLIC PANEL
BUN: 33 mg/dL — ABNORMAL HIGH (ref 6–23)
CALCIUM: 9 mg/dL (ref 8.4–10.5)
CO2: 27 mEq/L (ref 19–32)
CREATININE: 1.67 mg/dL — AB (ref 0.50–1.10)
Chloride: 98 mEq/L (ref 96–112)
GFR calc Af Amer: 34 mL/min — ABNORMAL LOW (ref >90)
GFR, EST NON AFRICAN AMERICAN: 29 mL/min — AB (ref >90)
Glucose, Bld: 116 mg/dL — ABNORMAL HIGH (ref 70–99)
Potassium: 4.7 mEq/L (ref 3.7–5.3)
SODIUM: 136 meq/L — AB (ref 137–147)

## 2013-11-02 MED ORDER — WARFARIN SODIUM 2.5 MG PO TABS
2.5000 mg | ORAL_TABLET | Freq: Every day | ORAL | Status: DC
  Administered 2013-11-02 – 2013-11-04 (×3): 2.5 mg via ORAL
  Filled 2013-11-02 (×5): qty 1

## 2013-11-02 MED ORDER — FUROSEMIDE 10 MG/ML IJ SOLN
40.0000 mg | Freq: Once | INTRAMUSCULAR | Status: AC
  Administered 2013-11-02: 40 mg via INTRAVENOUS
  Filled 2013-11-02: qty 4

## 2013-11-02 NOTE — Progress Notes (Addendum)
       [address]       [place] [address]             [phone]          10 Days Post-Op Procedure(s) (LRB): CORONARY ARTERY BYPASS GRAFTING (CABG) (N/A) INTRAOPERATIVE TRANSESOPHAGEAL ECHOCARDIOGRAM (N/A) CLIPPING OF ATRIAL APPENDAGE (N/A)  Subjective: Still gets short winded with exertion or talking, otherwise feels well.   Objective: Vital signs in last 24 hours: Patient Vitals for the past 24 hrs:  BP Temp Temp src Pulse Resp SpO2 Weight  11/02/13 0540 123/59 mmHg 98.7 F (37.1 C) Oral 73 - 100 % 267 lb 6.4 oz (121.292 kg)  11/01/13 2113 113/54 mmHg 98.9 F (37.2 C) Oral 68 - 97 % -  11/01/13 1500 - 98.2 F (36.8 C) Oral 67 19 100 % -   Current Weight  11/02/13 267 lb 6.4 oz (121.292 kg)  BASELINE WEIGHT: 113 kg    Intake/Output from previous day: 03/07 0701 - 03/08 0700 In: 240 [P.O.:240] Out: 350 [Urine:350]  CBGs 114-179-130-116    PHYSICAL EXAM:  Heart: RRR Lungs: Decreased BS in bases Wound: Clean and dry Extremities: + LE edema, R>L    Lab Results: CBC: Recent Labs  11/01/13 0501 11/02/13 0516  WBC 8.2 8.9  HGB 7.8* 7.7*  HCT 23.0* 22.4*  PLT 380 433*   BMET:  Recent Labs  11/01/13 0501 11/02/13 0516  NA 136* 136*  K 4.6 4.7  CL 100 98  CO2 25 27  GLUCOSE 126* 116*  BUN 29* 33*  CREATININE 1.58* 1.67*  CALCIUM 8.4 9.0    PT/INR:  Recent Labs  11/02/13 0516  LABPROT 21.5*  INR 1.93*      Assessment/Plan: S/P Procedure(s) (LRB): CORONARY ARTERY BYPASS GRAFTING (CABG) (N/A) INTRAOPERATIVE TRANSESOPHAGEAL ECHOCARDIOGRAM (N/A) CLIPPING OF ATRIAL APPENDAGE (N/A) CV- Maintaining SR, BPs stable. Continue current meds. INR 1.4 ->1.9 today. Will decrease Coumadin to 2.5 mg daily and watch. Vol overload- continue diuresis, will start po Lasix today. UOP not accurate due to incontinence, but wt remaining stable. Pulm- still on O2. Continue to wean as able.  DM- sugars generally stable. Was on  Metformin, Byetta at home, presently on SSI. Continue to hold Metformin in light of elevated Cr.  Mild RI- Cr up slightly today.  Will watch and recheck in am. Expected postop blood loss anemia- H/H stable. Will hold off on transfusion for now and continue Fe. Pt tolerating well. Deconditioning- PT/CRPI.  Disp- CIR vs SNF when medically stable.    LOS: 13 days    COLLINS,GINA H 11/02/2013   I have seen and examined the patient and agree with the assessment and plan as outlined.  However, will transfuse 2 units PRBCs for symptomatic anemia which has gotten worse.  Breslin Hemann H 11/02/2013 11:56 AM

## 2013-11-03 LAB — BASIC METABOLIC PANEL
BUN: 34 mg/dL — ABNORMAL HIGH (ref 6–23)
CHLORIDE: 99 meq/L (ref 96–112)
CO2: 26 meq/L (ref 19–32)
Calcium: 9 mg/dL (ref 8.4–10.5)
Creatinine, Ser: 1.58 mg/dL — ABNORMAL HIGH (ref 0.50–1.10)
GFR calc Af Amer: 36 mL/min — ABNORMAL LOW (ref >90)
GFR calc non Af Amer: 31 mL/min — ABNORMAL LOW (ref >90)
Glucose, Bld: 129 mg/dL — ABNORMAL HIGH (ref 70–99)
Potassium: 4.4 mEq/L (ref 3.7–5.3)
SODIUM: 137 meq/L (ref 137–147)

## 2013-11-03 LAB — PROTIME-INR
INR: 2 — ABNORMAL HIGH (ref 0.00–1.49)
Prothrombin Time: 22.1 seconds — ABNORMAL HIGH (ref 11.6–15.2)

## 2013-11-03 LAB — TYPE AND SCREEN
ABO/RH(D): A POS
ANTIBODY SCREEN: NEGATIVE
UNIT DIVISION: 0
UNIT DIVISION: 0

## 2013-11-03 LAB — GLUCOSE, CAPILLARY
GLUCOSE-CAPILLARY: 210 mg/dL — AB (ref 70–99)
Glucose-Capillary: 139 mg/dL — ABNORMAL HIGH (ref 70–99)
Glucose-Capillary: 130 mg/dL — ABNORMAL HIGH (ref 70–99)
Glucose-Capillary: 88 mg/dL (ref 70–99)

## 2013-11-03 LAB — CBC
HEMATOCRIT: 28.7 % — AB (ref 36.0–46.0)
HEMOGLOBIN: 9.8 g/dL — AB (ref 12.0–15.0)
MCH: 28.1 pg (ref 26.0–34.0)
MCHC: 34.1 g/dL (ref 30.0–36.0)
MCV: 82.2 fL (ref 78.0–100.0)
Platelets: 441 10*3/uL — ABNORMAL HIGH (ref 150–400)
RBC: 3.49 MIL/uL — AB (ref 3.87–5.11)
RDW: 16.1 % — ABNORMAL HIGH (ref 11.5–15.5)
WBC: 9.5 10*3/uL (ref 4.0–10.5)

## 2013-11-03 MED ORDER — AMIODARONE HCL 200 MG PO TABS
200.0000 mg | ORAL_TABLET | Freq: Every day | ORAL | Status: DC
  Administered 2013-11-03 – 2013-11-05 (×3): 200 mg via ORAL
  Filled 2013-11-03 (×3): qty 1

## 2013-11-03 NOTE — Progress Notes (Signed)
Rehab admissions - I faxed clinical updates to [employer] on 10-31-13 and left a voicemail with case manager at [facility] this am. I have not heard back from case manager. I met with pt to give update that we are still waiting on insurance approval from [place].  I will keep the pt/team updated on progress with [employer] as we await their authorization. Please call me with any questions.  Thanks.  Nanetta Batty, PT Rehabilitation Admissions Coordinator [phone]

## 2013-11-03 NOTE — Progress Notes (Signed)
Physical Therapy Treatment Patient Details Name: Elaine [name] MRN: [id number] DOB: [birth year] Today's Date: 11/03/2013 Time: [id number] PT Time Calculation (min): 21 min  PT Assessment / Plan / Recommendation  History of Present Illness 73 y.o. adm 10/20/13 with several weeks of ongoing/persistent chest pressure and now shortness of breath and edema. Upon EMS arrival EKG showed atrial fibrillation with a left bundle branch block. Code STEMI was called. 2/26 underwent CABG x 3 with clipping of Lt atrial appendage. PMH of HTN, DM-2, obesity & former smoker   PT Comments   Pt progressing towards physical therapy goals. Fatigued during session, and pt reports ambulating with staff earlier this morning, as well as transferring to [facility] and sitting for prolonged amount of time. Pt unable to come to full stand with RW and assist by pushing up from knees. Frequent cueing required to not use UE's for pushing/pulling while repositioning in the chair.   Follow Up Recommendations  CIR;Supervision for mobility/OOB     Does the patient have the potential to tolerate intense rehabilitation     Barriers to Discharge        Equipment Recommendations  Rolling walker with 5" wheels    Recommendations for Other Services    Frequency Min 3X/week   Progress towards PT Goals Progress towards PT goals: Progressing toward goals  Plan Current plan remains appropriate    Precautions / Restrictions Precautions Precautions: Sternal;Fall Precaution Comments: VC's to avoid using UE's to pull herself forward in the chair or push herself up and back in the chair for repositioning.  Restrictions Weight Bearing Restrictions: No Other Position/Activity Restrictions: pt able to state no pushing with UEs and not to raise UEs above 90 degrees   Pertinent Vitals/Pain Pt reports pain in RLE incision site, stating RN was aware of her complaints.     Mobility  Bed Mobility Overal bed mobility:  (not performed, pt received  in recliner) General bed mobility comments: Pt received sitting up in recliner.  Transfers Overall transfer level: Needs assistance Equipment used: Rolling walker (2 wheeled) Transfers: Sit to/from Stand Sit to Stand:  (Unable) Stand pivot transfers: Min assist;Mod assist General transfer comment: Attempted sit<>stand x3, and pt unable to come to full stand. Attempted with pillow hug at first, and then with pushing off of knees to stand. Attempts unsuccessful due to pt fatigue. She reports that earlier in the morning she was walking with staff and sat on the [facility] for a prolonged period of time.     Exercises General Exercises - Lower Extremity Ankle Circles/Pumps: 10 reps Long Arc Quad: 10 reps Hip ABduction/ADduction: 10 reps (pillow squeeze x10 each) Straight Leg Raises: 5 reps Hip Flexion/Marching: 10 reps;Seated Toe Raises: 10 reps;Seated Heel Raises: 5 reps;Seated   PT Diagnosis:    PT Problem List:   PT Treatment Interventions:     PT Goals (current goals can now be found in the care plan section) Acute Rehab PT Goals Patient Stated Goal: eventually return home to care for her husband with mild dementia PT Goal Formulation: With patient Time For Goal Achievement: 11/02/13 Potential to Achieve Goals: Good  Visit Information  Last PT Received On: 11/03/13 Assistance Needed: +1 History of Present Illness: 73 y.o. adm 10/20/13 with several weeks of ongoing/persistent chest pressure and now shortness of breath and edema. Upon EMS arrival EKG showed atrial fibrillation with a left bundle branch block. Code STEMI was called. 2/26 underwent CABG x 3 with clipping of Lt atrial appendage. PMH of HTN,  DM-2, obesity & former smoker    Subjective Data  Subjective: "I am determined to keep working and get better." Patient Stated Goal: eventually return home to care for her husband with mild dementia   Cognition  Cognition Arousal/Alertness: Awake/alert Behavior During Therapy: WFL for  tasks assessed/performed Overall Cognitive Status: Within Functional Limits for tasks assessed Memory: Decreased recall of precautions    Balance  Balance Overall balance assessment: Needs assistance Sitting-balance support: Feet supported Sitting balance-Leahy Scale: Good  End of Session PT - End of Session Equipment Utilized During Treatment: Oxygen;Gait belt Activity Tolerance: Patient limited by fatigue Patient left: in chair;with call bell/phone within reach;with family/visitor present Nurse Communication: Mobility status   GP     Jolyn Lent 11/03/2013, 12:12 PM  Jolyn Lent, PT, DPT [place] Pager: [phone]

## 2013-11-03 NOTE — Progress Notes (Addendum)
NUTRITION FOLLOW UP  INTERVENTION: Continue Resource Breeze po BID, each supplement provides 250 kcal and 9 grams of protein RD to follow for nutrition care plan  NUTRITION DIAGNOSIS: Inadequate oral intake, resolved  Goal: Pt to meet >/= 90% of their estimated nutrition needs, met  Monitor:  PO & supplemental intake, weight, labs, I/O's  ASSESSMENT: 73 y.o. female with PMH of HTN, DM-2, obesity & former smoker who has several weeks of ongoing/persistent chest pressure and now shortness of breath and edema. She has been on aggressive therapy for reflux that has not been effective. This morning at roughly 3:30: This is the became notably worse and she became more short of breath with worsening pressure.   After calling her primary care physician's office or to call 911. Upon EMS arrival EKG showed what looked like either sinus tachycardia or atrial fibrillation with a left bundle branch block. Intermittently the computer read the left pulmonary clock as being possible anterior MI. Code STEMI was called, and the patient was met in the emergency room. She was essentially symptom-free from a chest pressure perspective but remained dyspneic.   She was in atrial fibrillation with a rate in the low 100s, she did note some chest pressure somewhat which was reproducible on exam. She has notable lower chimney edema and wheezing on exam. No obvious rales but does note orthopnea.  Patient s/p procedure 2/26: CORONARY ARTERY BYPASS GRAFTING x 3 EVH -- RIGHT THIGH   Patient transferred to 2W-Cardiac from 2S-Surgical ICU 3/3.  Diet texture advanced to Dysphagia 3.  PO intake improved at 50-75% per flowsheet records.   She reports she is drinking her Resource Breeze supplements.  CIR awaiting insurance approval for admission.   Height: Ht Readings from Last 1 Encounters:  10/21/13 5' 1.81" (1.57 m)    Weight: Wt Readings from Last 1 Encounters:  11/03/13 266 lb 1.5 oz (120.7 kg)    BMI:   Body mass index is 48.97 kg/(m^2).  Estimated Nutritional Needs: Kcal: 1600-1800 Protein: 90-100 gm Fluid: 1.6-1.8 L  Skin: Intact  Diet Order: Dysphagia 3, thin liquids   Intake/Output Summary (Last 24 hours) at 11/03/13 1226 Last data filed at 11/03/13 0749  Gross per 24 hour  Intake  902.5 ml  Output    100 ml  Net  802.5 ml    Labs:   Recent Labs Lab 11/01/13 0501 11/02/13 0516 11/03/13 0500  NA 136* 136* 137  K 4.6 4.7 4.4  CL 100 98 99  CO2 25 27 26   BUN 29* 33* 34*  CREATININE 1.58* 1.67* 1.58*  CALCIUM 8.4 9.0 9.0  GLUCOSE 126* 116* 129*    CBG (last 3)   Recent Labs  11/02/13 2139 11/03/13 0630 11/03/13 1109  GLUCAP 137* 130* 210*    Scheduled Meds: . amiodarone  200 mg Oral Daily  . aspirin EC  325 mg Oral Daily  . atorvastatin  20 mg Oral q1800  . bisacodyl  10 mg Oral Daily   Or  . bisacodyl  10 mg Rectal Daily  . docusate sodium  200 mg Oral Daily  . feeding supplement (RESOURCE BREEZE)  1 Container Oral TID WC  . ferrous sulfate  325 mg Oral Q breakfast  . furosemide  80 mg Oral BID  . guaiFENesin  600 mg Oral BID  . insulin aspart  0-20 Units Subcutaneous TID WC  . levothyroxine  112 mcg Oral QAC breakfast  . metoprolol tartrate  25 mg Oral BID  .  pantoprazole  40 mg Oral Daily  . potassium chloride  20 mEq Oral Daily  . prednisoLONE acetate  1 drop Right Eye Daily  . sodium chloride  3 mL Intravenous Q12H  . sodium chloride  3 mL Intravenous Q12H  . warfarin  2.5 mg Oral q1800  . Warfarin - Physician Dosing Inpatient   Does not apply q1800    Continuous Infusions:    Past Medical History  Diagnosis Date  . Arthritis   . Diabetes mellitus   . Heart murmur   . Hyperlipidemia   . Hypertension   . Hypothyroidism   . NSTEMI (non-ST elevated myocardial infarction) 10/20/2013  . Ischemic cardiomyopathy 10/20/2013  . Acute pulmonary edema with congestive heart failure 10/20/2013  . Left bundle branch block (LBBB) on  electrocardiogram   . Atrial fibrillation 10/20/2013  . OSTEOARTHRITIS 08/06/2006  . Morbid obesity   . Diabetic neuropathy   . Type II diabetes mellitus with complication 83/29/1916  . Chronic kidney disease   . S/P CABG x 3 10/23/2013    LIMA to LAD, SVG to OM2, SVG to RPLB, EVH via right thigh  . Acute myocardial infarction of anterior wall 10/20/2013    Past Surgical History  Procedure Laterality Date  . Corneal transplant  2011    right eye  . Total hip arthroplasty  2010, 2011    right 2010, left 2011  . Abdominal hysterectomy    . Coronary artery bypass graft N/A 10/23/2013    Procedure: CORONARY ARTERY BYPASS GRAFTING (CABG);  Surgeon: Rexene Alberts, MD;  Location: [facility];  Service: Open Heart Surgery;  Laterality: N/A;  . Intraoperative transesophageal echocardiogram N/A 10/23/2013    Procedure: INTRAOPERATIVE TRANSESOPHAGEAL ECHOCARDIOGRAM;  Surgeon: Rexene Alberts, MD;  Location: [facility];  Service: Open Heart Surgery;  Laterality: N/A;  . Clipping of atrial appendage N/A 10/23/2013    Procedure: CLIPPING OF ATRIAL APPENDAGE;  Surgeon: Rexene Alberts, MD;  Location: [facility];  Service: Open Heart Surgery;  Laterality: N/A;    Arthur Holms, RD, LDN Pager #: [phone] After-Hours Pager #: [phone]

## 2013-11-03 NOTE — Progress Notes (Signed)
Occupational Therapy Treatment Patient Details Name: Elaine [name] MRN: [id number] DOB: Dec 23, 1940 Today's Date: 11/03/2013 Time: [id number] OT Time Calculation (min): 33 min  OT Assessment / Plan / Recommendation  History of present illness 73 y.o. adm 10/20/13 with several weeks of ongoing/persistent chest pressure and now shortness of breath and edema. Upon EMS arrival EKG showed atrial fibrillation with a left bundle branch block. Code STEMI was called. 2/26 underwent CABG x 3 with clipping of Lt atrial appendage. PMH of HTN, DM-2, obesity & former smoker   OT comments  Focus of session on instruction in use of AE for LB ADL and energy conservation. Pt continues to want to rely on her UEs to assist with sit to stand, particularly from lower surfaces.   Follow Up Recommendations  CIR    Barriers to Discharge       Equipment Recommendations       Recommendations for Other Services    Frequency Min 2X/week   Progress towards OT Goals Progress towards OT goals: Progressing toward goals  Plan Discharge plan remains appropriate    Precautions / Restrictions Precautions Precautions: Sternal;Fall Precaution Comments: verbal cues to avoid using UEs to push up from chair Restrictions Weight Bearing Restrictions: No (sternal) Other Position/Activity Restrictions: pt able to state no pushing with UEs and not to raise UEs above 90 degrees   Pertinent Vitals/Pain VSS, on 02, no pain reported, uses pillow to splint chest    ADL  Grooming: Wash/dry hands;Min guard Where Assessed - Grooming: Supported standing Lower Body Bathing:  (instructed in use of long handled sponge) Where Assessed - Lower Body Bathing: Unsupported sitting;Supported sit to stand Lower Body Dressing: Minimal assistance (instructed in use of reacher and sock aide, practiced) Where Assessed - Lower Body Dressing: Unsupported sitting;Supported sit to stand Toilet Transfer: Minimal assistance Toilet Transfer Method: Sit  to stand Toilet Transfer Equipment: Bedside commode Toileting - Clothing Manipulation and Hygiene: Minimal assistance Where Assessed - Toileting Clothing Manipulation and Hygiene: Sit to stand from 3-in-1 or toilet Equipment Used: Rolling walker (02) Transfers/Ambulation Related to ADLs: Continued cues provided to for sternal precautions.  Pt requires several rest breaks when standing due to poor endurance. ADL Comments: May need wide sock aide due to increased edema in feet. Instructed in pacing, energy conservation, and purse lip breathing. Pt is breathless with conversation.    OT Diagnosis:    OT Problem List:   OT Treatment Interventions:     OT Goals(current goals can now be found in the care plan section) Acute Rehab OT Goals Patient Stated Goal: eventually return home to care for her husband with mild dementia  Visit Information  Last OT Received On: 11/03/13 Assistance Needed: +1 History of Present Illness: 73 y.o. adm 10/20/13 with several weeks of ongoing/persistent chest pressure and now shortness of breath and edema. Upon EMS arrival EKG showed atrial fibrillation with a left bundle branch block. Code STEMI was called. 2/26 underwent CABG x 3 with clipping of Lt atrial appendage. PMH of HTN, DM-2, obesity & former smoker    Subjective Data      Prior Functioning       Cognition  Cognition Arousal/Alertness: Awake/alert Behavior During Therapy: WFL for tasks assessed/performed Overall Cognitive Status: Within Functional Limits for tasks assessed Memory: Decreased recall of precautions    Mobility  Bed Mobility Overal bed mobility:  (not performed, pt received in recliner) Transfers Overall transfer level: Needs assistance Equipment used: Rolling walker (2 wheeled) Sit to Stand: Min  assist;Mod assist (with momentum) Stand pivot transfers: Min assist;Mod assist General transfer comment: required min from commode and mod from lower recliner    Exercises       Balance    End of Session OT - End of Session Activity Tolerance: Patient limited by fatigue Patient left: in chair;with call bell/phone within reach  GO     Elaine [name] 11/03/2013, 10:17 AM [phone]

## 2013-11-03 NOTE — Progress Notes (Signed)
Subjective:   s/p CAGB x 3 Making good progress  Objective:   Vital Signs in the last 24 hours: Temp:  [97.9 F (36.6 C)-99.1 F (37.3 C)] 98.2 F (36.8 C) (03/09 0338) Pulse Rate:  [6-66] 64 (03/09 0338) Resp:  [16-20] 20 (03/09 0338) BP: (103-144)/(50-90) 138/87 mmHg (03/09 0338) SpO2:  [97 %-100 %] 98 % (03/09 0338) Weight:  [266 lb 1.5 oz (120.7 kg)] 266 lb 1.5 oz (120.7 kg) (03/09 0338)  Intake/Output from previous day: 03/08 0701 - 03/09 0700 In: 902.5 [P.O.:240; Blood:662.5] Out: 200 [Urine:200]  Medications: . amiodarone  200 mg Oral Daily  . aspirin EC  325 mg Oral Daily  . atorvastatin  20 mg Oral q1800  . bisacodyl  10 mg Oral Daily   Or  . bisacodyl  10 mg Rectal Daily  . docusate sodium  200 mg Oral Daily  . enoxaparin (LOVENOX) injection  30 mg Subcutaneous QHS  . feeding supplement (RESOURCE BREEZE)  1 Container Oral TID WC  . ferrous sulfate  325 mg Oral Q breakfast  . furosemide  80 mg Oral BID  . guaiFENesin  600 mg Oral BID  . insulin aspart  0-20 Units Subcutaneous TID WC  . levothyroxine  112 mcg Oral QAC breakfast  . metoprolol tartrate  25 mg Oral BID  . pantoprazole  40 mg Oral Daily  . potassium chloride  20 mEq Oral Daily  . prednisoLONE acetate  1 drop Right Eye Daily  . sodium chloride  3 mL Intravenous Q12H  . sodium chloride  3 mL Intravenous Q12H  . warfarin  2.5 mg Oral q1800  . Warfarin - Physician Dosing Inpatient   Does not apply q1800       Physical Exam:   General appearance: alert, cooperative and no distress Neck: no adenopathy, no JVD, supple, symmetrical, trachea midline and thyroid not enlarged, symmetric, no tenderness/mass/nodules Lungs:  Slightly reduced breath sounds  Heart: regular rate and rhythm and 1/6 sem Abdomen: soft, non-tender; bowel sounds normal; no masses,  no organomegaly Extremities: edema bilateral 1+ LE Neurologic: Grossly normal   Rate: 72  Rhythm:  NSR \  Lab Results:     Recent  Labs  11/02/13 0516 11/03/13 0500  NA 136* 137  K 4.7 4.4  CL 98 99  CO2 27 26  GLUCOSE 116* 129*  BUN 33* 34*  CREATININE 1.67* 1.58*   No results found for this basename: TROPONINI, CK, MB,  in the last 72 hours Hepatic Function Panel No results found for this basename: PROT, ALBUMIN, AST, ALT, ALKPHOS, BILITOT, BILIDIR, IBILI,  in the last 72 hours  Recent Labs  11/03/13 0500  INR 2.00*   BNP (last 3 results)  Recent Labs  10/21/13 0307 10/22/13 0229 11/01/13 0501  PROBNP 12597.0* 13837.0* 10094.0*    Lipid Panel     Component Value Date/Time   CHOL 147 10/21/2013 0307   TRIG 69 10/21/2013 0307   HDL 83 10/21/2013 0307   CHOLHDL 1.8 10/21/2013 0307   VLDL 14 10/21/2013 0307   LDLCALC 50 10/21/2013 0307      Imaging:  No results found.    Assessment/Plan:   Principal Problem:   S/P CABG x 3 Active Problems:   Type II diabetes mellitus with complication   Obesity   HYPERTENSION   Atrial fibrillation   Left bundle branch block (LBBB) on electrocardiogram   Acute pulmonary edema with congestive heart failure   Left main coronary artery disease  Acute myocardial infarction of anterior wall   Ischemic cardiomyopathy   Morbid obesity   Chronic kidney disease   1. CAD:  S/p CABG, slow progress.  She was up walking earlier today - still requiring lots of assistance. Continue metoprolol Getting IV lasix Slow progress  2. Anemia:  Hb = 7.8 . Getting iron supplement.  3.  Atrial Fib:  In NSR , on amio ( dose has been decreased) , warfarin  4. Hypothyroidism:      Ramond Dial., MD, [facility] 11/03/2013, 9:16 AM Office - [phone] Pager [phone]

## 2013-11-03 NOTE — Progress Notes (Signed)
Pharmacist Heart Failure Core Measure Documentation  Assessment: Elaine [name] has an EF documented as 20-25% on 10/20/13 by echo.  Rationale: Heart failure patients with left ventricular systolic dysfunction (LVSD) and an EF < 40% should be prescribed an angiotensin converting enzyme inhibitor (ACEI) or angiotensin receptor blocker (ARB) at discharge unless a contraindication is documented in the medical record.  This patient is not currently on an ACEI or ARB for HF.  This note is being placed in the record in order to provide documentation that a contraindication to the use of these agents is present for this encounter.  ACE Inhibitor or Angiotensin Receptor Blocker is contraindicated (specify all that apply)  []   ACEI allergy AND ARB allergy []   Angioedema []   Moderate or severe aortic stenosis []   Hyperkalemia []   Hypotension []   Renal artery stenosis [x]   Worsening renal function, preexisting renal disease or dysfunction   Beverlee Nims 11/03/2013 2:45 PM

## 2013-11-03 NOTE — Progress Notes (Signed)
Per CIR, they are still waiting on insurance approval. CSW will check with family and patient today to see if they have chosen a backup SNF.  Jeanette Caprice, MSW, [place]

## 2013-11-03 NOTE — Progress Notes (Addendum)
      [address]       [place] [address]             [phone]        11 Days Post-Op Procedure(s) (LRB): CORONARY ARTERY BYPASS GRAFTING (CABG) (N/A) INTRAOPERATIVE TRANSESOPHAGEAL ECHOCARDIOGRAM (N/A) CLIPPING OF ATRIAL APPENDAGE (N/A)  Subjective: Patient eating breakfast. She states she feels better since transfusion yesterday.  Objective: Vital signs in last 24 hours: Temp:  [97.9 F (36.6 C)-99.1 F (37.3 C)] 98.2 F (36.8 C) (03/09 0338) Pulse Rate:  [6-66] 64 (03/09 0338) Cardiac Rhythm:  [-] Heart block (03/08 2020) Resp:  [16-20] 20 (03/09 0338) BP: (103-144)/(50-90) 138/87 mmHg (03/09 0338) SpO2:  [97 %-100 %] 98 % (03/09 0338) Weight:  [120.7 kg (266 lb 1.5 oz)] 120.7 kg (266 lb 1.5 oz) (03/09 0338)  Pre op weight 113 kg Current Weight  11/03/13 120.7 kg (266 lb 1.5 oz)      Intake/Output from previous day: 03/08 0701 - 03/09 0700 In: 902.5 [P.O.:240; Blood:662.5] Out: 200 [Urine:200]   Physical Exam:  Cardiovascular: RRR Pulmonary: Diminshed breath sounds at bases; no rales, wheezes, or rhonchi. Abdomen: Soft, non tender, bowel sounds present. Extremities:Bilateral lower extremity edema. Wounds: Clean and dry.  No erythema or signs of infection.  Lab Results: CBC:  Recent Labs  11/02/13 0516 11/03/13 0500  WBC 8.9 9.5  HGB 7.7* 9.8*  HCT 22.4* 28.7*  PLT 433* 441*   BMET:   Recent Labs  11/02/13 0516 11/03/13 0500  NA 136* 137  K 4.7 4.4  CL 98 99  CO2 27 26  GLUCOSE 116* 129*  BUN 33* 34*  CREATININE 1.67* 1.58*  CALCIUM 9.0 9.0    PT/INR:  Lab Results  Component Value Date   INR 2.00* 11/03/2013   INR 1.93* 11/02/2013   INR 1.43 11/01/2013   ABG:  INR: Will add last result for INR, ABG once components are confirmed Will add last 4 CBG results once components are confirmed  Assessment/Plan:  1. CV - S/p STEMI.Previous a fib with RVR .On Lopressor 25 bid, Amiodarone 400 bid, and Coumadin. HR in  the 60's. Will decrease Amiodarone to 200 daily.INR up to 2. 2.  Pulmonary - On 2 liters of oxygen via [place].Encourage incentive spirometer.  3. Volume Overload - On Lasix 80 bid. 4.  Acute blood loss anemia - H and H up to 9.8 and 28.7 after transfusion. Continue Ferrous. 6. Creatinine 1.58 7.DM-CBGs 93/137/130. On Insulin. On Metformin 1000 bid and Exantide pre op. Metformin not restarted as Cr elevated. Pre op HGA1C 5.7. Will consider restarting Exantide. 8.Possible discharge to [facility] Wedensday  ZIMMERMAN,DONIELLE MPA-C 11/03/2013,7:30 AM   I have seen and examined the patient and agree with the assessment and plan as outlined.  [name],[name] 11/03/2013 9:34 AM

## 2013-11-03 NOTE — Progress Notes (Signed)
CARDIAC REHAB PHASE I   PRE:  Rate/Rhythm: 67 SR    BP: sitting 133/51    SaO2: 98 2L  MODE:  Ambulation: 135 ft   POST:  Rate/Rhythm: 97 SR    BP: sitting 144/62     SaO2: 95-97 2L  Pt somewhat improved today, SOB seems better. Incontinent of urine in chair on arrival. To [facility] for BM before walk. Today she c/o sore incision on right leg and increased swelling. Sat in room for rest after cleaning from BM and x1 sitting rest in hall (improved from Saturday). Still SOB and diaphoretic with activity. To [facility] again after walk then recliner.  [id number]  Darrick Meigs CES, ACSM 11/03/2013 9:09 AM

## 2013-11-04 LAB — GLUCOSE, CAPILLARY
GLUCOSE-CAPILLARY: 149 mg/dL — AB (ref 70–99)
GLUCOSE-CAPILLARY: 130 mg/dL — AB (ref 70–99)
Glucose-Capillary: 129 mg/dL — ABNORMAL HIGH (ref 70–99)
Glucose-Capillary: 133 mg/dL — ABNORMAL HIGH (ref 70–99)
Glucose-Capillary: 127 mg/dL — ABNORMAL HIGH (ref 70–99)

## 2013-11-04 LAB — BASIC METABOLIC PANEL
BUN: 36 mg/dL — AB (ref 6–23)
CALCIUM: 8.9 mg/dL (ref 8.4–10.5)
CO2: 27 mEq/L (ref 19–32)
Chloride: 99 mEq/L (ref 96–112)
Creatinine, Ser: 1.68 mg/dL — ABNORMAL HIGH (ref 0.50–1.10)
GFR calc Af Amer: 34 mL/min — ABNORMAL LOW (ref >90)
GFR, EST NON AFRICAN AMERICAN: 29 mL/min — AB (ref >90)
GLUCOSE: 134 mg/dL — AB (ref 70–99)
POTASSIUM: 4.2 meq/L (ref 3.7–5.3)
Sodium: 139 mEq/L (ref 137–147)

## 2013-11-04 LAB — PROTIME-INR
INR: 2.11 — ABNORMAL HIGH (ref 0.00–1.49)
Prothrombin Time: 23 seconds — ABNORMAL HIGH (ref 11.6–15.2)

## 2013-11-04 MED ORDER — METOPROLOL TARTRATE 12.5 MG HALF TABLET: 12.5000 mg | ORAL_TABLET | Freq: Two times a day (BID) | ORAL | Status: DC

## 2013-11-04 MED ORDER — CARVEDILOL 6.25 MG PO TABS
6.2500 mg | ORAL_TABLET | Freq: Two times a day (BID) | ORAL | Status: DC
  Administered 2013-11-04 – 2013-11-05 (×3): 6.25 mg via ORAL
  Filled 2013-11-04 (×4): qty 1

## 2013-11-04 MED ORDER — LISINOPRIL 2.5 MG PO TABS
2.5000 mg | ORAL_TABLET | Freq: Every day | ORAL | Status: DC
  Administered 2013-11-05: 2.5 mg via ORAL
  Filled 2013-11-04: qty 1

## 2013-11-04 NOTE — Progress Notes (Signed)
Rehab admissions - I have authorization from [facility] for inpatient rehab admission.  Noted patient not ready for rehab today due to need for further diuresis.  Will follow up in am.  Call me for questions.  [id number]

## 2013-11-04 NOTE — Progress Notes (Signed)
CARDIAC REHAB PHASE I   PRE:  Rate/Rhythm: 59 SR  BP:  Supine:   Sitting: 127/54  Standing:    SaO2: 98 2L  MODE:  Ambulation: 150 ft   POST:  Rate/Rhythm: 80 SR  BP:  Supine:   Sitting: 148/53  Standing:    SaO2: 98 2L 1320-1400 On arrival pt in recliner, incontient of urine. Pt to [facility] linens in chair changed and pt cleaned up. Assisted X 2 used rollater and O2 2L to ambulate. Gait steady with rollater. Pt is DOE and tires with walking. She gets very diaphoretic with walking. Pt took several standing rest stops with walking and only one sitting one today. She was able to walk 150 feet. She continues to be very determined to walk and get well. Pt back to recliner after walk with call light in reach.  Rodney Langton RN 11/04/2013 1:55 PM

## 2013-11-04 NOTE — Progress Notes (Addendum)
      [address]       [place] [address]             [phone]        12 Days Post-Op Procedure(s) (LRB): CORONARY ARTERY BYPASS GRAFTING (CABG) (N/A) INTRAOPERATIVE TRANSESOPHAGEAL ECHOCARDIOGRAM (N/A) CLIPPING OF ATRIAL APPENDAGE (N/A)  Subjective: Patient sitting in chair. No specific complaints.  Objective: Vital signs in last 24 hours: Temp:  [97.6 F (36.4 C)-98.5 F (36.9 C)] 98.5 F (36.9 C) (03/10 0517) Pulse Rate:  [64-68] 68 (03/10 0517) Cardiac Rhythm:  [-] Heart block (03/09 2023) Resp:  [18-19] 18 (03/10 0517) BP: (99-125)/(56-78) 99/78 mmHg (03/10 0517) SpO2:  [93 %-100 %] 99 % (03/10 0517) Weight:  [120.4 kg (265 lb 6.9 oz)] 120.4 kg (265 lb 6.9 oz) (03/10 0517)  Pre op weight 113 kg Current Weight  11/04/13 120.4 kg (265 lb 6.9 oz)      Intake/Output from previous day: 03/09 0701 - 03/10 0700 In: 360 [P.O.:240; I.V.:120] Out: 900 [Urine:900]   Physical Exam:  Cardiovascular: RRR Pulmonary: Slightly diminshed breath sounds at bases; no rales, wheezes, or rhonchi. Abdomen: Soft, non tender, bowel sounds present. Extremities:Bilateral lower extremity edema. Wounds: Clean and dry.  No erythema or signs of infection.  Lab Results: CBC:  Recent Labs  11/02/13 0516 11/03/13 0500  WBC 8.9 9.5  HGB 7.7* 9.8*  HCT 22.4* 28.7*  PLT 433* 441*   BMET:   Recent Labs  11/03/13 0500 11/04/13 0425  NA 137 139  K 4.4 4.2  CL 99 99  CO2 26 27  GLUCOSE 129* 134*  BUN 34* 36*  CREATININE 1.58* 1.68*  CALCIUM 9.0 8.9    PT/INR:  Lab Results  Component Value Date   INR 2.11* 11/04/2013   INR 2.00* 11/03/2013   INR 1.93* 11/02/2013   ABG:  INR: Will add last result for INR, ABG once components are confirmed Will add last 4 CBG results once components are confirmed  Assessment/Plan:  1. CV - S/p STEMI.Previous a fib with RVR . SR in the 60's. On Lopressor 25 bid, Amiodarone 200 daily, and Coumadin. HR in the 60's.  May need to decrease Lopressor if HR remains in the low 60's. INR up to 2.11 2.  Pulmonary - On 2 liters of oxygen via [place].Encourage incentive spirometer.  3. Volume Overload - On Lasix 80 bid. 4.  Acute blood loss anemia - H and H up to 9.8 and 28.7 after transfusion. Continue Ferrous. 6. Creatinine up to 1.68 7.DM-CBGs 88/129/133. On Insulin. On Metformin 1000 bid  pre op. Metformin not restarted as Cr elevated. Pre op HGA1C 5.7.  8.Possible discharge to [facility] Wedensday  ZIMMERMAN,DONIELLE MPA-C 11/04/2013,7:48 AM    I have seen and examined the patient and agree with the assessment and plan as outlined.  Will change Lopressor to Coreg and add low dose ACE-I.  Looks ready for d/c to inpatient rehab.  Will need to watch renal function.  Continue insulin and hold metformin.  Will need f/u with Cardiology sooner rather than later.  [name],[name] 11/04/2013 4:01 PM

## 2013-11-04 NOTE — Progress Notes (Signed)
Ambulated in hallway with patient this evening. Patient ambulated approx. 150 feet using rolling wheel chair, on 2L O2. Patient stopped frequently to catch her breath and rest. Patient stated that she wanted to do more, but that her right leg was causing her to much pain. Will continue to encourage activity. Elmarie Shiley R

## 2013-11-04 NOTE — Progress Notes (Signed)
Per RN, patient has approval for CIR and will be admitted there within a day or two..CSW signing off at this time. Please re consult if social work needs arise.  Jeanette Caprice, MSW, [place]

## 2013-11-05 ENCOUNTER — Inpatient Hospital Stay (HOSPITAL_COMMUNITY): Payer: Medicare HMO

## 2013-11-05 ENCOUNTER — Inpatient Hospital Stay (HOSPITAL_COMMUNITY)
Admission: RE | Admit: 2013-11-05 | Discharge: 2013-11-13 | DRG: 945 | Disposition: A | Discharge status: Home Health | Payer: Medicare HMO | Source: Intra-hospital | Attending: Physical Medicine & Rehabilitation | Admitting: Physical Medicine & Rehabilitation

## 2013-11-05 DIAGNOSIS — E039 Hypothyroidism, unspecified: Secondary | ICD-10-CM | POA: Diagnosis present

## 2013-11-05 DIAGNOSIS — Z5189 Encounter for other specified aftercare: Principal | ICD-10-CM

## 2013-11-05 DIAGNOSIS — IMO0002 Reserved for concepts with insufficient information to code with codable children: Secondary | ICD-10-CM

## 2013-11-05 DIAGNOSIS — Z951 Presence of aortocoronary bypass graft: Secondary | ICD-10-CM

## 2013-11-05 DIAGNOSIS — I501 Left ventricular failure: Secondary | ICD-10-CM | POA: Diagnosis present

## 2013-11-05 DIAGNOSIS — E1142 Type 2 diabetes mellitus with diabetic polyneuropathy: Secondary | ICD-10-CM | POA: Diagnosis present

## 2013-11-05 DIAGNOSIS — D62 Acute posthemorrhagic anemia: Secondary | ICD-10-CM | POA: Diagnosis present

## 2013-11-05 DIAGNOSIS — I129 Hypertensive chronic kidney disease with stage 1 through stage 4 chronic kidney disease, or unspecified chronic kidney disease: Secondary | ICD-10-CM | POA: Diagnosis present

## 2013-11-05 DIAGNOSIS — Z87891 Personal history of nicotine dependence: Secondary | ICD-10-CM

## 2013-11-05 DIAGNOSIS — Z96649 Presence of unspecified artificial hip joint: Secondary | ICD-10-CM

## 2013-11-05 DIAGNOSIS — I2589 Other forms of chronic ischemic heart disease: Secondary | ICD-10-CM | POA: Diagnosis present

## 2013-11-05 DIAGNOSIS — R5381 Other malaise: Secondary | ICD-10-CM | POA: Diagnosis present

## 2013-11-05 DIAGNOSIS — I219 Acute myocardial infarction, unspecified: Secondary | ICD-10-CM | POA: Diagnosis present

## 2013-11-05 DIAGNOSIS — Z7901 Long term (current) use of anticoagulants: Secondary | ICD-10-CM

## 2013-11-05 DIAGNOSIS — E119 Type 2 diabetes mellitus without complications: Secondary | ICD-10-CM

## 2013-11-05 DIAGNOSIS — N189 Chronic kidney disease, unspecified: Secondary | ICD-10-CM | POA: Diagnosis present

## 2013-11-05 DIAGNOSIS — Z7982 Long term (current) use of aspirin: Secondary | ICD-10-CM | POA: Diagnosis not present

## 2013-11-05 DIAGNOSIS — I447 Left bundle-branch block, unspecified: Secondary | ICD-10-CM | POA: Diagnosis present

## 2013-11-05 DIAGNOSIS — I509 Heart failure, unspecified: Secondary | ICD-10-CM | POA: Diagnosis present

## 2013-11-05 DIAGNOSIS — E1149 Type 2 diabetes mellitus with other diabetic neurological complication: Secondary | ICD-10-CM | POA: Diagnosis present

## 2013-11-05 DIAGNOSIS — M1990 Unspecified osteoarthritis, unspecified site: Secondary | ICD-10-CM | POA: Diagnosis present

## 2013-11-05 DIAGNOSIS — E669 Obesity, unspecified: Secondary | ICD-10-CM

## 2013-11-05 DIAGNOSIS — E785 Hyperlipidemia, unspecified: Secondary | ICD-10-CM | POA: Diagnosis present

## 2013-11-05 DIAGNOSIS — I10 Essential (primary) hypertension: Secondary | ICD-10-CM

## 2013-11-05 DIAGNOSIS — I4891 Unspecified atrial fibrillation: Secondary | ICD-10-CM | POA: Diagnosis present

## 2013-11-05 LAB — GLUCOSE, CAPILLARY
GLUCOSE-CAPILLARY: 107 mg/dL — AB (ref 70–99)
Glucose-Capillary: 141 mg/dL — ABNORMAL HIGH (ref 70–99)
Glucose-Capillary: 135 mg/dL — ABNORMAL HIGH (ref 70–99)
Glucose-Capillary: 170 mg/dL — ABNORMAL HIGH (ref 70–99)

## 2013-11-05 LAB — PROTIME-INR
INR: 2.16 — ABNORMAL HIGH (ref 0.00–1.49)
Prothrombin Time: 23.4 seconds — ABNORMAL HIGH (ref 11.6–15.2)

## 2013-11-05 MED ORDER — BISACODYL 5 MG PO TBEC
10.0000 mg | DELAYED_RELEASE_TABLET | Freq: Every day | ORAL | Status: DC
  Administered 2013-11-06 – 2013-11-11 (×5): 10 mg via ORAL
  Filled 2013-11-05 (×6): qty 2

## 2013-11-05 MED ORDER — WARFARIN SODIUM 2.5 MG PO TABS: 2.5000 mg | ORAL_TABLET | Freq: Every day | ORAL | Status: DC

## 2013-11-05 MED ORDER — POTASSIUM CHLORIDE CRYS ER 20 MEQ PO TBCR: 20.0000 meq | EXTENDED_RELEASE_TABLET | Freq: Every day | ORAL | Status: DC

## 2013-11-05 MED ORDER — CARVEDILOL 6.25 MG PO TABS: 6.2500 mg | ORAL_TABLET | Freq: Two times a day (BID) | ORAL | Status: DC

## 2013-11-05 MED ORDER — ONDANSETRON HCL 4 MG PO TABS: 4.0000 mg | ORAL_TABLET | Freq: Four times a day (QID) | ORAL | Status: DC | PRN

## 2013-11-05 MED ORDER — INSULIN ASPART 100 UNIT/ML ~~LOC~~ SOLN
0.0000 [IU] | Freq: Three times a day (TID) | SUBCUTANEOUS | Status: DC
  Administered 2013-11-06 – 2013-11-07 (×2): 4 [IU] via SUBCUTANEOUS
  Administered 2013-11-07: 3 [IU] via SUBCUTANEOUS
  Administered 2013-11-08: 7 [IU] via SUBCUTANEOUS
  Administered 2013-11-08 – 2013-11-09 (×2): 3 [IU] via SUBCUTANEOUS
  Administered 2013-11-09 – 2013-11-10 (×2): 4 [IU] via SUBCUTANEOUS
  Administered 2013-11-11 – 2013-11-13 (×4): 3 [IU] via SUBCUTANEOUS

## 2013-11-05 MED ORDER — ATORVASTATIN CALCIUM 20 MG PO TABS
20.0000 mg | ORAL_TABLET | Freq: Every day | ORAL | Status: DC
  Administered 2013-11-05 – 2013-11-12 (×8): 20 mg via ORAL
  Filled 2013-11-05 (×10): qty 1

## 2013-11-05 MED ORDER — ACETAMINOPHEN 325 MG PO TABS: 325.0000 mg | ORAL_TABLET | ORAL | Status: DC | PRN

## 2013-11-05 MED ORDER — GUAIFENESIN ER 600 MG PO TB12: 600.0000 mg | ORAL_TABLET | Freq: Two times a day (BID) | ORAL | Status: DC | PRN

## 2013-11-05 MED ORDER — LISINOPRIL 2.5 MG PO TABS
2.5000 mg | ORAL_TABLET | Freq: Every day | ORAL | Status: DC
Start: 2013-11-05 — End: 2013-11-13

## 2013-11-05 MED ORDER — CARVEDILOL 6.25 MG PO TABS
6.2500 mg | ORAL_TABLET | Freq: Two times a day (BID) | ORAL | Status: DC
  Administered 2013-11-06 – 2013-11-09 (×7): 6.25 mg via ORAL
  Administered 2013-11-10: 09:00:00 via ORAL
  Administered 2013-11-10 – 2013-11-13 (×6): 6.25 mg via ORAL
  Filled 2013-11-05 (×18): qty 1

## 2013-11-05 MED ORDER — LEVALBUTEROL HCL 0.63 MG/3ML IN NEBU: 0.6300 mg | INHALATION_SOLUTION | Freq: Four times a day (QID) | RESPIRATORY_TRACT | Status: DC | PRN

## 2013-11-05 MED ORDER — OXYCODONE HCL 5 MG PO TABS: 5.0000 mg | ORAL_TABLET | ORAL | Status: DC | PRN

## 2013-11-05 MED ORDER — BISACODYL 10 MG RE SUPP: 10.0000 mg | Freq: Every day | RECTAL | Status: DC

## 2013-11-05 MED ORDER — BOOST / RESOURCE BREEZE PO LIQD: 1.0000 | Freq: Three times a day (TID) | ORAL | Status: DC

## 2013-11-05 MED ORDER — INSULIN ASPART 100 UNIT/ML ~~LOC~~ SOLN: 0.0000 [IU] | Freq: Three times a day (TID) | SUBCUTANEOUS | Status: DC

## 2013-11-05 MED ORDER — GUAIFENESIN ER 600 MG PO TB12
600.0000 mg | ORAL_TABLET | Freq: Two times a day (BID) | ORAL | Status: DC
  Administered 2013-11-05 – 2013-11-13 (×16): 600 mg via ORAL
  Filled 2013-11-05 (×19): qty 1

## 2013-11-05 MED ORDER — POTASSIUM CHLORIDE CRYS ER 20 MEQ PO TBCR
20.0000 meq | EXTENDED_RELEASE_TABLET | Freq: Every day | ORAL | Status: DC
  Administered 2013-11-06 – 2013-11-13 (×8): 20 meq via ORAL
  Filled 2013-11-05 (×9): qty 1

## 2013-11-05 MED ORDER — WARFARIN - PHARMACIST DOSING INPATIENT
Freq: Every day | Status: DC
  Administered 2013-11-06 – 2013-11-07 (×2)

## 2013-11-05 MED ORDER — AMIODARONE HCL 200 MG PO TABS: 200.0000 mg | ORAL_TABLET | Freq: Every day | ORAL | Status: DC

## 2013-11-05 MED ORDER — ASPIRIN EC 81 MG PO TBEC
81.0000 mg | DELAYED_RELEASE_TABLET | Freq: Every day | ORAL | Status: DC
  Administered 2013-11-05: 81 mg via ORAL
  Filled 2013-11-05: qty 1

## 2013-11-05 MED ORDER — ASPIRIN EC 81 MG PO TBEC
81.0000 mg | DELAYED_RELEASE_TABLET | Freq: Every day | ORAL | Status: DC
  Administered 2013-11-06 – 2013-11-13 (×8): 81 mg via ORAL
  Filled 2013-11-05 (×9): qty 1

## 2013-11-05 MED ORDER — SORBITOL 70 % SOLN: 30.0000 mL | Freq: Every day | Status: DC | PRN

## 2013-11-05 MED ORDER — FERROUS SULFATE 325 (65 FE) MG PO TABS
325.0000 mg | ORAL_TABLET | Freq: Every day | ORAL | Status: DC
  Administered 2013-11-06 – 2013-11-13 (×8): 325 mg via ORAL
  Filled 2013-11-05 (×9): qty 1

## 2013-11-05 MED ORDER — FUROSEMIDE 80 MG PO TABS
80.0000 mg | ORAL_TABLET | Freq: Two times a day (BID) | ORAL | Status: DC
  Administered 2013-11-05 – 2013-11-06 (×2): 80 mg via ORAL
  Filled 2013-11-05 (×4): qty 1

## 2013-11-05 MED ORDER — LEVOTHYROXINE SODIUM 112 MCG PO TABS
112.0000 ug | ORAL_TABLET | Freq: Every day | ORAL | Status: DC
  Administered 2013-11-06 – 2013-11-13 (×8): 112 ug via ORAL
  Filled 2013-11-05 (×10): qty 1

## 2013-11-05 MED ORDER — FERROUS SULFATE 325 (65 FE) MG PO TABS: 325.0000 mg | ORAL_TABLET | Freq: Every day | ORAL | Status: DC

## 2013-11-05 MED ORDER — ONDANSETRON HCL 4 MG/2ML IJ SOLN: 4.0000 mg | Freq: Four times a day (QID) | INTRAMUSCULAR | Status: DC | PRN

## 2013-11-05 MED ORDER — LISINOPRIL 2.5 MG PO TABS
2.5000 mg | ORAL_TABLET | Freq: Every day | ORAL | Status: DC
  Administered 2013-11-06 – 2013-11-13 (×8): 2.5 mg via ORAL
  Filled 2013-11-05 (×9): qty 1

## 2013-11-05 MED ORDER — AMIODARONE HCL 200 MG PO TABS
200.0000 mg | ORAL_TABLET | Freq: Every day | ORAL | Status: DC
  Administered 2013-11-06 – 2013-11-13 (×8): 200 mg via ORAL
  Filled 2013-11-05 (×9): qty 1

## 2013-11-05 MED ORDER — PREDNISOLONE ACETATE 1 % OP SUSP
1.0000 [drp] | Freq: Every day | OPHTHALMIC | Status: DC
  Administered 2013-11-06 – 2013-11-13 (×8): 1 [drp] via OPHTHALMIC
  Filled 2013-11-05: qty 1

## 2013-11-05 MED ORDER — BOOST / RESOURCE BREEZE PO LIQD
1.0000 | Freq: Three times a day (TID) | ORAL | Status: DC
  Administered 2013-11-05 – 2013-11-12 (×11): 1 via ORAL

## 2013-11-05 MED ORDER — PANTOPRAZOLE SODIUM 40 MG PO TBEC
40.0000 mg | DELAYED_RELEASE_TABLET | Freq: Every day | ORAL | Status: DC
  Administered 2013-11-06 – 2013-11-13 (×8): 40 mg via ORAL
  Filled 2013-11-05 (×8): qty 1

## 2013-11-05 MED ORDER — FUROSEMIDE 40 MG PO TABS: 80.0000 mg | ORAL_TABLET | Freq: Two times a day (BID) | ORAL | Status: DC

## 2013-11-05 MED ORDER — DOCUSATE SODIUM 100 MG PO CAPS
200.0000 mg | ORAL_CAPSULE | Freq: Every day | ORAL | Status: DC
  Administered 2013-11-06 – 2013-11-13 (×8): 200 mg via ORAL
  Filled 2013-11-05 (×9): qty 2

## 2013-11-05 MED ORDER — TRAMADOL HCL 50 MG PO TABS: 50.0000 mg | ORAL_TABLET | Freq: Four times a day (QID) | ORAL | Status: DC | PRN

## 2013-11-05 MED ORDER — WARFARIN SODIUM 2.5 MG PO TABS
2.5000 mg | ORAL_TABLET | Freq: Every day | ORAL | Status: DC
  Administered 2013-11-05 – 2013-11-12 (×8): 2.5 mg via ORAL
  Filled 2013-11-05 (×10): qty 1

## 2013-11-05 NOTE — Progress Notes (Signed)
Patient admitted about 1645. Patient oriented to rehab unit and given rehab notebook. Physical assessment done. Patient alert and oriented x4. Vitals stable. Patient denies any pain. Patient call bell within reach. Reported off rest of admission documentation to night nurse.

## 2013-11-05 NOTE — Progress Notes (Signed)
Physical Therapy Treatment Patient Details Name: Elaine [name] MRN: [id number] DOB: 04/26/1941 Today's Date: 11/05/2013 Time: [id number] PT Time Calculation (min): 25 min  PT Assessment / Plan / Recommendation  History of Present Illness 73 y.o. adm 10/20/13 with several weeks of ongoing/persistent chest pressure and now shortness of breath and edema. Upon EMS arrival EKG showed atrial fibrillation with a left bundle branch block. Code STEMI was called. 2/26 underwent CABG x 3 with clipping of Lt atrial appendage. PMH of HTN, DM-2, obesity & former smoker   PT Comments   Progressing well,  Quick to fatigue.  sats maintained in upper 90's on 2 L [place].  Needs further reinforcement on sternal precautions.   Follow Up Recommendations  CIR     Does the patient have the potential to tolerate intense rehabilitation     Barriers to Discharge        Equipment Recommendations  Rolling walker with 5" wheels    Recommendations for Other Services    Frequency Min 3X/week   Progress towards PT Goals Progress towards PT goals: Progressing toward goals  Plan Current plan remains appropriate    Precautions / Restrictions Precautions Precautions: Sternal;Fall Precaution Comments: VC's to avoid using UE's to pull herself forward in the chair or push herself up and back in the chair for repositioning.    Pertinent Vitals/Pain See above.     Mobility  Transfers Overall transfer level: Needs assistance Equipment used: Rolling walker (2 wheeled) Transfers: Sit to/from Stand Sit to Stand: Min assist;Mod assist (depending of height of seating surface) General transfer comment: still needs reinforcement on sternal precautions Ambulation/Gait Ambulation/Gait assistance: Min assist Ambulation Distance (Feet): 70 Feet (then 75 feet after a 3 min rest.) Assistive device: Rolling walker (2 wheeled) Gait Pattern/deviations: Step-through pattern Gait velocity interpretation: Below normal speed for  age/gender General Gait Details: 1 seated rest break b/w 2 trials of gait.  mildly unsteady and weak gait.    Exercises     PT Diagnosis:    PT Problem List:   PT Treatment Interventions:     PT Goals (current goals can now be found in the care plan section) Acute Rehab PT Goals PT Goal Formulation: With patient Time For Goal Achievement: 11/07/13 Potential to Achieve Goals: Good  Visit Information  Last PT Received On: 11/05/13 Assistance Needed: +1 History of Present Illness: 73 y.o. adm 10/20/13 with several weeks of ongoing/persistent chest pressure and now shortness of breath and edema. Upon EMS arrival EKG showed atrial fibrillation with a left bundle branch block. Code STEMI was called. 2/26 underwent CABG x 3 with clipping of Lt atrial appendage. PMH of HTN, DM-2, obesity & former smoker    Subjective Data  Subjective: You've gotta push yourself alittle   Cognition  Cognition Arousal/Alertness: Awake/alert Behavior During Therapy: WFL for tasks assessed/performed Overall Cognitive Status: Within Functional Limits for tasks assessed    Balance  Balance Overall balance assessment: Needs assistance Sitting-balance support: Feet supported;No upper extremity supported;Bilateral upper extremity supported Sitting balance-Leahy Scale: Good Standing balance support: Bilateral upper extremity supported Standing balance-Leahy Scale: Fair  End of Session PT - End of Session Equipment Utilized During Treatment: Oxygen;Gait belt Activity Tolerance: Patient limited by fatigue;Patient tolerated treatment well Patient left: in chair;with call bell/phone within reach;with family/visitor present Nurse Communication: Mobility status   GP     Toula Miyasaki, Tessie Fass 11/05/2013, 11:56 AM 11/05/2013  Donnella Sham, PT [phone]  (pager)

## 2013-11-05 NOTE — H&P (View-Only) (Signed)
Physical Medicine and Rehabilitation Admission H&P    Chief Complaint  Patient presents with  . Chest Pain  :  Chief complaint: Weakness  HPI: Elaine [name] is a 73 y.o. right-handed female with history of hypertension, type 2 diabetes mellitus, obesity and former tobacco abuse. Patient independent prior to admission caring for her husband. Admitted 10/20/2013 with persistent chest pressure and shortness of breath with lower extremity edema. Noted to be in atrial fibrillation with rate in the low 100s. There was some ST segment abnormalities concerning for possible STEMI. Cardiac catheterization demonstrates moderate left main coronary artery stenosis with high-grade proximal stenosis of the left anterior descending coronary artery and three-vessel disease. Underwent CABG x3 as well as clipping of left atrial appendage 10/23/2013 per Dr. Roxy Manns. Hospital course orthostasis maintained on dopamine. Bouts of fluid overload with diuresis ongoing. Acute blood loss anemia 9.8 and monitored on iron supplement. Coumadin initiated in relation to atrial fibrillation bypass surgery. . She is tolerating a mechanical soft diet. Physical therapy evaluation completed 10/26/2013 with sternal precautions. Recommendations have been made for physical medicine rehabilitation consult. Patient was admitted for comprehensive rehabilitation program   ROS Review of Systems  Respiratory: Positive for shortness of breath.  Cardiovascular: Positive for chest pain.  Gastrointestinal: Positive for constipation.  Musculoskeletal: Positive for joint pain and myalgias.  All other systems reviewed and are negative  Past Medical History  Diagnosis Date  . Arthritis   . Diabetes mellitus   . Heart murmur   . Hyperlipidemia   . Hypertension   . Hypothyroidism   . NSTEMI (non-ST elevated myocardial infarction) 10/20/2013  . Ischemic cardiomyopathy 10/20/2013  . Acute pulmonary edema with congestive heart failure 10/20/2013    . Left bundle branch block (LBBB) on electrocardiogram   . Atrial fibrillation 10/20/2013  . OSTEOARTHRITIS 08/06/2006  . Morbid obesity   . Diabetic neuropathy   . Type II diabetes mellitus with complication 70/35/0093  . Chronic kidney disease   . S/P CABG x 3 10/23/2013    LIMA to LAD, SVG to OM2, SVG to RPLB, EVH via right thigh  . Acute myocardial infarction of anterior wall 10/20/2013   Past Surgical History  Procedure Laterality Date  . Corneal transplant  2011    right eye  . Total hip arthroplasty  2010, 2011    right 2010, left 2011  . Abdominal hysterectomy    . Coronary artery bypass graft N/A 10/23/2013    Procedure: CORONARY ARTERY BYPASS GRAFTING (CABG);  Surgeon: Rexene Alberts, MD;  Location: [facility];  Service: Open Heart Surgery;  Laterality: N/A;  . Intraoperative transesophageal echocardiogram N/A 10/23/2013    Procedure: INTRAOPERATIVE TRANSESOPHAGEAL ECHOCARDIOGRAM;  Surgeon: Rexene Alberts, MD;  Location: [facility];  Service: Open Heart Surgery;  Laterality: N/A;  . Clipping of atrial appendage N/A 10/23/2013    Procedure: CLIPPING OF ATRIAL APPENDAGE;  Surgeon: Rexene Alberts, MD;  Location: [place] [facility];  Service: Open Heart Surgery;  Laterality: N/A;   Family History  Problem Relation Age of Onset  . Colon cancer Neg Hx   . Esophageal cancer Neg Hx   . Rectal cancer Neg Hx   . Stomach cancer Neg Hx    Social History:  reports that she quit smoking about 20 years ago. She has never used smokeless tobacco. She reports that she does not drink alcohol or use illicit drugs. Allergies:  Allergies  Allergen Reactions  . Tape Rash    Adhesive tape  Medications Prior to Admission  Medication Dose Route Frequency Provider Last Rate Last Dose  . 0.9 %  sodium chloride infusion  500 mL Intravenous Continuous Sable Feil, MD       Medications Prior to Admission  Medication Sig Dispense Refill  . amLODipine (NORVASC) 5 MG tablet Take 5 mg by mouth daily.      [name]  aspirin EC 81 MG tablet Take 81 mg by mouth daily.      . benazepril (LOTENSIN) 40 MG tablet Take 40 mg by mouth daily.      . CRESTOR 20 MG tablet Take 10 mg by mouth at bedtime.       . Exenatide (BYDUREON) 2 MG SUSR Inject 2 mg into the skin once a week.       . furosemide (LASIX) 40 MG tablet Take 40 mg by mouth daily.      . Glucosamine-Chondroit-Vit C-Mn (GLUCOSAMINE CHONDR 1500 COMPLX PO) Take 2 tablets by mouth daily.      [name] levothyroxine (SYNTHROID, LEVOTHROID) 112 MCG tablet Take 112 mcg by mouth daily before breakfast.       . metFORMIN (GLUCOPHAGE) 1000 MG tablet 1,000 mg 2 (two) times daily with a meal.       . Multiple Vitamins-Minerals (MULTIVITAMIN PO) Take 1 tablet by mouth daily.      . naproxen sodium (ALEVE) 220 MG tablet Take 220 mg by mouth at bedtime.      . prednisoLONE acetate (PRED FORTE) 1 % ophthalmic suspension Place 1 drop into the right eye daily.        Home: Home Living Family/patient expects to be discharged to:: Private residence Living Arrangements: Spouse/significant other Available Help at Discharge: Family;Available 24 hours/day (plans to stay with her daughter) Type of Home: Apartment Home Access: Stairs to enter [employer] of Steps: 6 Entrance Stairs-Rails: None Home Layout: One level Home Equipment: Cane - single point;Walker - 4 wheels;Bedside commode;Shower seat Additional Comments: spouse has early dementia; grandson & son are caring for him; pt to go stay with her daughter   Functional History: Prior Function Comments: used cane outside; used seat in shower  Functional Status:  Mobility:min to mod assist with basic transfers and ambulation     Ambulation/Gait Ambulation Distance (Feet): 86 Feet (74, seated rest, 12) General Gait Details: Unable due to tachycardia (136 with standing) RN made aware and in to see pt    ADL:    Cognition: Cognition Overall Cognitive Status: Within Functional Limits for tasks  assessed Orientation Level: Oriented X4 Cognition Arousal/Alertness: Awake/alert Behavior During Therapy: WFL for tasks assessed/performed Overall Cognitive Status: Within Functional Limits for tasks assessed  Physical Exam: Blood pressure 106/48, pulse 76, temperature 98.3 F (36.8 C), temperature source Oral, resp. rate 20, height 5' 1.81" (1.57 m), weight 122.6 kg (270 lb 4.5 oz), SpO2 100.00%. Physical Exam Constitutional: She is oriented to person, place, and time. No distress.  obese  HENT: only a few remaining teeth. Oral mucosa pink and moist Head: Normocephalic and atraumatic.  Eyes: EOM are normal.  Right eye vision limited  Neck: Normal range of motion. Neck supple. No JVD present. No tracheal deviation present. No thyromegaly present.  Cardiovascular: Normal rate.  Cardiac rate controlled. No murmurs, rubs, or gallops Respiratory:  Decreased breath sounds at the bases but clear to auscultation. No wheezes, rales, or rhonchi GI: Soft. Bowel sounds are normal. She exhibits no distension.  Obese  Neurological: She is alert and oriented to person, place,  and time.  UE 4/5 prox to distally bilaterally .LLE 3-/5 HF, RLE 1+ HF (pain), BLLE: 3- to 3KE and 4+/5 ADF/APF bilaterally.  Skin:  midline chest incision clean and dry. Leg incision intact/tender along right inner thigh  Psychiatric: She has a normal mood and affect. Her behavior is normal. Judgment and thought content normal  Results for orders placed during the hospital encounter of 10/20/13 (from the past 48 hour(s))  GLUCOSE, CAPILLARY     Status: Abnormal   Collection Time    10/28/13 12:01 PM      Result Value Ref Range   Glucose-Capillary 167 (*) 70 - 99 mg/dL  GLUCOSE, CAPILLARY     Status: Abnormal   Collection Time    10/28/13  6:25 PM      Result Value Ref Range   Glucose-Capillary 148 (*) 70 - 99 mg/dL  GLUCOSE, CAPILLARY     Status: Abnormal   Collection Time    10/28/13  9:58 PM      Result Value  Ref Range   Glucose-Capillary 142 (*) 70 - 99 mg/dL  CBC     Status: Abnormal   Collection Time    10/29/13  5:50 AM      Result Value Ref Range   WBC 8.3  4.0 - 10.5 K/uL   RBC 3.05 (*) 3.87 - 5.11 MIL/uL   Hemoglobin 8.4 (*) 12.0 - 15.0 g/dL   HCT 24.9 (*) 36.0 - 46.0 %   MCV 81.6  78.0 - 100.0 fL   MCH 27.5  26.0 - 34.0 pg   MCHC 33.7  30.0 - 36.0 g/dL   RDW 16.0 (*) 11.5 - 15.5 %   Platelets 279  150 - 400 K/uL  BASIC METABOLIC PANEL     Status: Abnormal   Collection Time    10/29/13  5:50 AM      Result Value Ref Range   Sodium 138  137 - 147 mEq/L   Potassium 4.3  3.7 - 5.3 mEq/L   Chloride 104  96 - 112 mEq/L   CO2 23  19 - 32 mEq/L   Glucose, Bld 111 (*) 70 - 99 mg/dL   BUN 25 (*) 6 - 23 mg/dL   Creatinine, Ser 1.39 (*) 0.50 - 1.10 mg/dL   Calcium 8.0 (*) 8.4 - 10.5 mg/dL   GFR calc non Af Amer 37 (*) >90 mL/min   GFR calc Af Amer 42 (*) >90 mL/min   Comment: (NOTE)     The eGFR has been calculated using the CKD EPI equation.     This calculation has not been validated in all clinical situations.     eGFR's persistently <90 mL/min signify possible Chronic Kidney     Disease.  PROTIME-INR     Status: None   Collection Time    10/29/13  5:50 AM      Result Value Ref Range   Prothrombin Time 14.6  11.6 - 15.2 seconds   INR 1.16  0.00 - 1.49  GLUCOSE, CAPILLARY     Status: Abnormal   Collection Time    10/29/13  6:31 AM      Result Value Ref Range   Glucose-Capillary 111 (*) 70 - 99 mg/dL   Comment 1 Notify RN     Comment 2 Documented in Chart    GLUCOSE, CAPILLARY     Status: Abnormal   Collection Time    10/29/13  9:51 PM      Result Value Ref  Range   Glucose-Capillary 243 (*) 70 - 99 mg/dL  PROTIME-INR     Status: None   Collection Time    10/30/13  5:00 AM      Result Value Ref Range   Prothrombin Time 14.4  11.6 - 15.2 seconds   INR 1.14  0.00 - 1.61  BASIC METABOLIC PANEL     Status: Abnormal   Collection Time    10/30/13  5:00 AM      Result  Value Ref Range   Sodium 139  137 - 147 mEq/L   Potassium 4.5  3.7 - 5.3 mEq/L   Chloride 102  96 - 112 mEq/L   CO2 24  19 - 32 mEq/L   Glucose, Bld 105 (*) 70 - 99 mg/dL   BUN 28 (*) 6 - 23 mg/dL   Creatinine, Ser 1.54 (*) 0.50 - 1.10 mg/dL   Calcium 8.4  8.4 - 10.5 mg/dL   GFR calc non Af Amer 32 (*) >90 mL/min   GFR calc Af Amer 37 (*) >90 mL/min   Comment: (NOTE)     The eGFR has been calculated using the CKD EPI equation.     This calculation has not been validated in all clinical situations.     eGFR's persistently <90 mL/min signify possible Chronic Kidney     Disease.   Dg Chest 2 View  10/29/2013   CLINICAL DATA:  Shortness breath.  Weakness.  EXAM: CHEST  2 VIEW  COMPARISON:  Chest x-ray 10/27/2013.  FINDINGS: Left internal jugular Cordis with tip in the left innominate vein is unchanged. Lung volumes are low. Persistent opacity at the base of the left hemithorax is favored to reflect postoperative subsegmental atelectasis (underlying airspace consolidation is difficult to exclude). There is also a superimposed moderate left pleural effusion. Linear opacity in the left mid lung is compatible with subsegmental atelectasis. No evidence of pulmonary edema. Cardiopericardial silhouette is mildly enlarged (unchanged). The patient is rotated to the left on today's exam, resulting in distortion of the mediastinal contours and reduced diagnostic sensitivity and specificity for mediastinal pathology. Atherosclerosis in the thoracic aorta. Status post median sternotomy for CABG. Epicardial pacing wires remain in position.  IMPRESSION: 1. Allowing for slight differences in patient positioning and technique, the radiographic appearance the chest is essentially unchanged, as detailed above.   Electronically Signed   By: Vinnie Langton M.D.   On: 10/29/2013 08:26   Dg Chest Port 1 View  10/29/2013   CLINICAL DATA:  PICC placement.  EXAM: PORTABLE CHEST - 1 VIEW  COMPARISON:  Chest x-ray 10/29/2013.   FINDINGS: There is a right upper extremity PICC with tip terminating in the distal superior vena cava. Left internal jugular central venous Cordis with tip in the left innominate vein. Bibasilar opacities (left greater than right) favored to predominantly reflect postoperative subsegmental atelectasis with superimposed moderate left and increasing small to moderate right pleural effusion, although underlying airspace consolidation is difficult to exclude. No pneumothorax. No evidence of pulmonary edema. Heart size appears mildly enlarged. Status post median sternotomy for CABG and left atrial appendage ligation. Atherosclerosis in the thoracic aorta.  IMPRESSION: 1. Support apparatus and postoperative changes, as above. 2. Worsening aeration throughout the lung bases bilaterally, which may simply reflect pleural effusions (increasing on the right) with associated postoperative atelectasis, although developing airspace consolidation from infection is difficult to exclude.   Electronically Signed   By: Vinnie Langton M.D.   On: 10/29/2013 15:37    Post  Admission Physician Evaluation: 1. Functional deficits secondary  to deconditioning after STEMI/CABG. 2. Patient is admitted to receive collaborative, interdisciplinary care between the physiatrist, rehab nursing staff, and therapy team. 3. Patient's level of medical complexity and substantial therapy needs in context of that medical necessity cannot be provided at a lesser intensity of care such as a SNF. 4. Patient has experienced substantial functional loss from his/her baseline which was documented above under the "Functional History" and "Functional Status" headings.  Judging by the patient's diagnosis, physical exam, and functional history, the patient has potential for functional progress which will result in measurable gains while on inpatient rehab.  These gains will be of substantial and practical use upon discharge  in facilitating mobility and  self-care at the household level. 5. Physiatrist will provide 24 hour management of medical needs as well as oversight of the therapy plan/treatment and provide guidance as appropriate regarding the interaction of the two. 6. 24 hour rehab nursing will assist with bladder management, bowel management, safety, skin/wound care, disease management, medication administration, pain management and patient education  and help integrate therapy concepts, techniques,education, etc. 7. PT will assess and treat for/with: Lower extremity strength, range of motion, stamina, balance, functional mobility, safety, adaptive techniques and equipment, CV stamina, pain mgt, observance of sternal precautions.   Goals are: supervision to mod I. 8. OT will assess and treat for/with: ADL's, functional mobility, safety, upper extremity strength, adaptive techniques and equipment, pain mgt, adherence to sternal precautions, education.   Goals are: mod I to supervision. 9. SLP will assess and treat for/with: n/a.  Goals are: n/a (on D3 due to teeth). 10. Case Management and Social Worker will assess and treat for psychological issues and discharge planning. 11. Team conference will be held weekly to assess progress toward goals and to determine barriers to discharge. 12. Patient will receive at least 3 hours of therapy per day at least 5 days per week. 13. ELOS: 7-9 days       14. Prognosis:  excellent   Medical Problem List and Plan: 1. Deconditioning after STEMI/CABG 10/23/2013. Sternal precautions 2. DVT Prophylaxis/Anticoagulation: Coumadin per pharmacy. Latest INR 2.11 Monitor platelet counts and any signs of bleeding 3. Pain Management: Oxycodone as needed. Monitor with increased mobility 4. Neuropsych: This patient is capable of making decisions on her own behalf. 5. Hypertension/atrial fibrillation. Continue amiodarone as directed, Coreg 6.25 mg twice a day, lisinopril 2.5 mg daily. Rate control. No chest pain or  shortness of breath 6. Acute on chronic anemia. Latest hemoglobin 9.8. Continue iron supplement 7. Acute pulmonary edema with congestive heart failure. Continue Lasix. Monitor for any signs of fluid overload--with daily weights and I's and O's 8. Hypothyroidism. Synthroid 9. Type 2 diabetes mellitus. Hemoglobin A1c 5.7. Blood sugars 111-243. Continue sliding scale insulin. Check blood sugars a.c. and at bedtime. Patient on Glucophage 1000 mg twice a day prior to admission not resume secondary to elevated creatinine 1.68. Will resume as needed 10. Hyperlipidemia. Lipitor  Meredith Staggers, MD, [facility]    10/30/2013

## 2013-11-05 NOTE — Progress Notes (Signed)
Offered pt an opportunity to ambulate before returning to bed. Pt declined and stated she was tired from prior ambulation and sitting up in the chair. Will continue to monitor.

## 2013-11-05 NOTE — Progress Notes (Signed)
[id number] Cardiac Rehab Completed discharge education with pt. We discussed post-op recovery from heart surgery and CHF. I gave her CHF packet. We dicussed daily weights, sodium and fluid restrictions and when to call MD and 911. She voices understanding. Pt agrees to  [place]. CRP referral to [facility]. I put cardiac surgery video for her to watch. Deon Pilling, RN 11/05/2013 10:40 AM

## 2013-11-05 NOTE — Progress Notes (Addendum)
      [address]       [place] [address]             [phone]        13 Days Post-Op Procedure(s) (LRB): CORONARY ARTERY BYPASS GRAFTING (CABG) (N/A) INTRAOPERATIVE TRANSESOPHAGEAL ECHOCARDIOGRAM (N/A) CLIPPING OF ATRIAL APPENDAGE (N/A)  Subjective: Patient sitting in chair. No specific complaints.  Objective: Vital signs in last 24 hours: Temp:  [97.9 F (36.6 C)-98.8 F (37.1 C)] 98.1 F (36.7 C) (03/11 0423) Pulse Rate:  [56-71] 63 (03/11 0423) Cardiac Rhythm:  [-] Heart block (03/10 2153) Resp:  [16-18] 18 (03/11 0423) BP: (111-130)/(50-70) 119/65 mmHg (03/11 0423) SpO2:  [98 %-99 %] 98 % (03/11 0423) Weight:  [120 kg (264 lb 8.8 oz)] 120 kg (264 lb 8.8 oz) (03/11 0423)  Pre op weight 113 kg Current Weight  11/05/13 120 kg (264 lb 8.8 oz)      Intake/Output from previous day: 03/10 0701 - 03/11 0700 In: -  Out: 2100 [Urine:2100]   Physical Exam:  Cardiovascular: RRR Pulmonary: Slightly diminshed breath sounds at bases; no rales, wheezes, or rhonchi. Abdomen: Soft, non tender, bowel sounds present. Extremities:Bilateral lower extremity edema. Wounds: Clean and dry.  No erythema or signs of infection.  Lab Results: CBC:  Recent Labs  11/03/13 0500  WBC 9.5  HGB 9.8*  HCT 28.7*  PLT 441*   BMET:   Recent Labs  11/03/13 0500 11/04/13 0425  NA 137 139  K 4.4 4.2  CL 99 99  CO2 26 27  GLUCOSE 129* 134*  BUN 34* 36*  CREATININE 1.58* 1.68*  CALCIUM 9.0 8.9    PT/INR:  Lab Results  Component Value Date   INR 2.16* 11/05/2013   INR 2.11* 11/04/2013   INR 2.00* 11/03/2013   ABG:  INR: Will add last result for INR, ABG once components are confirmed Will add last 4 CBG results once components are confirmed  Assessment/Plan:  1. CV - S/p STEMI.Previous a fib with RVR . SR in the 60's. On Coreg 6.25 bid, Amiodarone 200 daily, Lisinopril 2.5 daily, and Coumadin.  INR up to 2.16 2.  Pulmonary - On 2 liters of oxygen  via [place].Encourage incentive spirometer.  3. Volume Overload - On Lasix 80 bid. 4.  Acute blood loss anemia - H and H up to 9.8 and 28.7 after transfusion. Continue Ferrous. 6. Creatinine up to 1.68 7.DM-CBGs127/130/107. On Insulin. On Metformin 1000 bid  pre op. Metformin not restarted as Cr elevated. Pre op HGA1C 5.7.  8.Remove sutures 9.To CIR today  ZIMMERMAN,DONIELLE MPA-C 11/05/2013,7:35 AM   I have seen and examined the patient and agree with the assessment and plan as outlined.  CXR w/ very small right pleural effusion, moderate left effusion - will need f/u CXR PA+LAT in approx 1 week or sooner if dyspnea gets worse.  Consider U/S guided thoracentesis if left effusion gets any larger.  Continue lasix 80 mg po bid for now and monitor weights which are slowly coming down but still 6 kg above baseline.   Ready for d/c to inpatient rehab.  Nancy Manuele H 11/05/2013 9:07 AM

## 2013-11-05 NOTE — PMR Pre-admission (Signed)
PMR Admission Coordinator Pre-Admission Assessment  Patient: Elaine [name] is an 73 y.o., female MRN: [id number] DOB: 05-Jul-1941 Height: 5' 1.81" (157 cm) Weight: 120 kg (264 lb 8.8 oz)              Insurance Information HMO:      PPO: Yes     PCP:       IPA:       80/20:       OTHER:   PRIMARY: [employer] Medicare      Policy#: [id number]      Subscriber: [name] CM Name: Coralyn Helling      Phone#: [phone]     Fax#: [phone] Pre-Cert#: [id number]      Employer: Retired Benefits:  Phone #: [phone]     Name: Automated Eff. Date: 08/28/13     Deduct: $0      Out of Pocket Max: $3950(met$273.67)      Life Max: unlimited CIR: $265 days 1-6      SNF: $0 days 1-20, $140 days 21-100 Outpatient: no limits     Co-Pay: $40/visit Home Health: 100%      Co-Pay: none DME: 80%     Co-Pay: 20% Providers: in network  SECONDARY: [place]    Policy#: [id number]       Subscriber: [name]  CM Name:        Phone#:       Fax#:   Pre-Cert#:        Employer: Retired  Benefits:  Phone #: [phone]      Name:   Eff. Date: 10/22/13      Deduct:        Out of Pocket Max:        Life Max:   CIR:        SNF:   Outpatient:       Co-Pay:   Home Health:        Co-Pay:   DME:       Co-Pay:     Emergency Contact Information Contact Information   Name Relation Home Work [place] [phone]  [phone]   Leda Min Daughter [phone]     Hyman Bower   [phone]     Current Medical History  Patient Admitting Diagnosis: Deconditioned after NSTEMI, CABG   History of Present Illness: A 73 y.o. right-handed female with history of hypertension, type 2 diabetes mellitus, obesity and former tobacco abuse. Patient independent prior to admission caring for her husband. Admitted 10/20/2013 with persistent chest pressure and shortness of breath with lower extremity edema. Noted to be in atrial fibrillation with rate in the low 100s. There  was some ST segment abnormalities concerning for possible STEMI. Cardiac catheterization demonstrates moderate left main coronary artery stenosis with high-grade proximal stenosis of the left anterior descending coronary artery and three-vessel disease. Underwent CABG x3 as well as clipping of left atrial appendage 10/23/2013 per Dr. Roxy Manns. Hospital course orthostasis maintained on dopamine. Bouts of fluid overload with diuresis ongoing. Acute blood loss anemia 9.8 and monitored on iron supplement. Coumadin initiated in relation to atrial fibrillation bypass surgery. . She is tolerating a mechanical soft diet. Physical therapy evaluation completed 10/26/2013 with sternal precautions. Recommendations have been made for physical medicine rehabilitation consult. Patient was admitted for comprehensive rehabilitation program     Past Medical History  Past Medical History  Diagnosis Date  . Arthritis   . Diabetes mellitus   . Heart murmur   .  Hyperlipidemia   . Hypertension   . Hypothyroidism   . NSTEMI (non-ST elevated myocardial infarction) 10/20/2013  . Ischemic cardiomyopathy 10/20/2013  . Acute pulmonary edema with congestive heart failure 10/20/2013  . Left bundle branch block (LBBB) on electrocardiogram   . Atrial fibrillation 10/20/2013  . OSTEOARTHRITIS 08/06/2006  . Morbid obesity   . Diabetic neuropathy   . Type II diabetes mellitus with complication 18/29/9371  . Chronic kidney disease   . S/P CABG x 3 10/23/2013    LIMA to LAD, SVG to OM2, SVG to RPLB, EVH via right thigh  . Acute myocardial infarction of anterior wall 10/20/2013    Family History  family history is negative for Colon cancer, Esophageal cancer, Rectal cancer, and Stomach cancer.  Prior Rehab/Hospitalizations:  Went to SNF after hip replacements and then had HH therapies.   Current Medications  Current facility-administered medications:0.9 %  sodium chloride infusion, 250 mL, Intravenous, PRN, Rexene Alberts, MD;   amiodarone (PACERONE) tablet 200 mg, 200 mg, Oral, Daily, Donielle M Zimmerman, PA-C, 200 mg at 11/05/13 1107;  aspirin EC tablet 81 mg, 81 mg, Oral, Daily, Donielle M Zimmerman, PA-C, 81 mg at 11/05/13 1107;  atorvastatin (LIPITOR) tablet 20 mg, 20 mg, Oral, q1800, Rexene Alberts, MD, 20 mg at 11/04/13 1708 bisacodyl (DULCOLAX) EC tablet 10 mg, 10 mg, Oral, Daily, Rexene Alberts, MD, 10 mg at 10/31/13 1338;  bisacodyl (DULCOLAX) suppository 10 mg, 10 mg, Rectal, Daily, Rexene Alberts, MD;  carvedilol (COREG) tablet 6.25 mg, 6.25 mg, Oral, BID WC, Rexene Alberts, MD, 6.25 mg at 11/05/13 6967;  docusate sodium (COLACE) capsule 200 mg, 200 mg, Oral, Daily, Rexene Alberts, MD, 200 mg at 11/05/13 1107 feeding supplement (RESOURCE BREEZE) (RESOURCE BREEZE) liquid 1 Container, 1 Container, Oral, TID WC, Rogue Bussing, RD, 1 Container at 11/05/13 0830;  ferrous sulfate tablet 325 mg, 325 mg, Oral, Q breakfast, Donielle M Zimmerman, PA-C, 325 mg at 11/05/13 8938;  furosemide (LASIX) tablet 80 mg, 80 mg, Oral, BID, Rexene Alberts, MD, 80 mg at 11/05/13 0835 guaiFENesin (MUCINEX) 12 hr tablet 600 mg, 600 mg, Oral, BID, Donielle M Zimmerman, PA-C, 600 mg at 11/05/13 1107;  insulin aspart (novoLOG) injection 0-20 Units, 0-20 Units, Subcutaneous, TID WC, Rexene Alberts, MD, 3 Units at 11/05/13 1121;  levalbuterol (XOPENEX) nebulizer solution 0.63 mg, 0.63 mg, Nebulization, Q6H PRN, Rexene Alberts, MD levothyroxine (SYNTHROID, LEVOTHROID) tablet 112 mcg, 112 mcg, Oral, QAC breakfast, Perry Mount, PA-C, 112 mcg at 11/05/13 0645;  lisinopril (PRINIVIL,ZESTRIL) tablet 2.5 mg, 2.5 mg, Oral, Daily, Rexene Alberts, MD, 2.5 mg at 11/05/13 1108;  magnesium hydroxide (MILK OF MAGNESIA) suspension 30 mL, 30 mL, Oral, Daily PRN, Rexene Alberts, MD ondansetron [facility]) injection 4 mg, 4 mg, Intravenous, Q6H PRN, Rexene Alberts, MD, 4 mg at 10/26/13 2022;  oxyCODONE (Oxy IR/ROXICODONE) immediate release tablet 5-10  mg, 5-10 mg, Oral, Q3H PRN, Rexene Alberts, MD, 5 mg at 10/26/13 0348;  pantoprazole (PROTONIX) EC tablet 40 mg, 40 mg, Oral, Daily, Rexene Alberts, MD, 40 mg at 11/05/13 1107 potassium chloride SA (K-DUR,KLOR-CON) CR tablet 20 mEq, 20 mEq, Oral, Daily, Rexene Alberts, MD, 20 mEq at 11/05/13 1107;  prednisoLONE acetate (PRED FORTE) 1 % ophthalmic suspension 1 drop, 1 drop, Right Eye, Daily, Rexene Alberts, MD, 1 drop at 11/05/13 1107;  sodium chloride 0.9 % injection 10-40 mL, 10-40 mL, Intracatheter, PRN, Rexene Alberts, MD, 10 mL at 11/05/13  0517 sodium chloride 0.9 % injection 3 mL, 3 mL, Intravenous, Q12H, Rexene Alberts, MD, 3 mL at 11/01/13 1000;  sodium chloride 0.9 % injection 3 mL, 3 mL, Intravenous, PRN, Rexene Alberts, MD;  sodium chloride 0.9 % injection 3 mL, 3 mL, Intravenous, Q12H, Rexene Alberts, MD, 3 mL at 11/04/13 1044;  sodium chloride 0.9 % injection 3 mL, 3 mL, Intravenous, PRN, Rexene Alberts, MD traMADol Veatrice Bourbon) tablet 50-100 mg, 50-100 mg, Oral, Q4H PRN, Rexene Alberts, MD, 50 mg at 11/05/13 0840;  warfarin (COUMADIN) tablet 2.5 mg, 2.5 mg, Oral, q1800, Coolidge Breeze, PA-C, 2.5 mg at 11/04/13 1746;  Warfarin - Physician Dosing Inpatient, , Does not apply, q1800, Crystal Avanell Shackleton, RPH  Patients Current Diet: Dysphagia  Precautions / Restrictions Precautions Precautions: Sternal;Fall Precaution Comments: VC's to avoid using UE's to pull herself forward in the chair or push herself up and back in the chair for repositioning.  Restrictions Weight Bearing Restrictions: No (sternal precautions.) Other Position/Activity Restrictions: pt able to state no pushing with UEs and not to raise UEs above 90 degrees   Prior Activity Level Community (5-7x/wk): Went out of the house 4-5 X a week to church and to choir.  Was caregiver for her husband and took him to MD appointments [place] / [place] Devices/Equipment: Kasandra Knudsen (specify quad  or straight) Home Equipment: Cane - single point;Walker - 4 wheels;Bedside commode;Shower seat  Prior Functional Level Prior Function Level of Independence: Independent with assistive device(s) Comments: used cane outside; used seat in shower  Current Functional Level Cognition  Overall Cognitive Status: Within Functional Limits for tasks assessed Orientation Level: Oriented X4    Extremity Assessment (includes Sensation/Coordination)          ADLs  Grooming: Wash/dry hands;Min guard  Where Assessed - Grooming: Supported standing  Lower Body Bathing: (instructed in use of long handled sponge)  Where Assessed - Lower Body Bathing: Unsupported sitting;Supported sit to stand  Lower Body Dressing: Minimal assistance (instructed in use of reacher and sock aide, practiced)  Where Assessed - Lower Body Dressing: Unsupported sitting;Supported sit to stand  Toilet Transfer: Minimal assistance  Toilet Transfer Method: Sit to stand  Toilet Transfer Equipment: Bedside commode  Toileting - Clothing Manipulation and Hygiene: Minimal assistance  Where Assessed - Toileting Clothing Manipulation and Hygiene: Sit to stand from 3-in-1 or toilet  Equipment Used: Rolling walker (02)  Transfers/Ambulation Related to ADLs: Continued cues provided to for sternal precautions. Pt requires several rest breaks when standing due to poor endurance.  ADL Comments: May need wide sock aide due to increased edema in feet. Instructed in pacing, energy conservation, and purse lip breathing. Pt is breathless with conversation.      Mobility  Overal bed mobility:  (not performed, pt received in recliner) Bed Mobility: Rolling;Sidelying to Sit Rolling: Min assist Sidelying to sit: Mod assist;HOB elevated Sit to sidelying: Max assist;+2 for physical assistance General bed mobility comments: Pt received sitting up in recliner.     Transfers  Overall transfer level: Needs assistance Equipment used: Rolling walker  (2 wheeled) Transfers: Sit to/from Stand Sit to Stand:  (Unable) Stand pivot transfers: Min assist;Mod assist General transfer comment: Attempted sit<>stand x3, and pt unable to come to full stand. Attempted with pillow hug at first, and then with pushing off of knees to stand. Attempts unsuccessful due to pt fatigue. She reports that earlier in the morning she was walking with staff and sat on  the [facility] for a prolonged period of time.     Ambulation / Gait / Stairs / Wheelchair Mobility  Ambulation/Gait Ambulation/Gait assistance: Museum/gallery curator (Feet): 60 Feet (with mulitiple rest breaks ) Assistive device: Rolling walker (2 wheeled) Gait Pattern/deviations: Step-through pattern;Decreased stride length Gait velocity: cautious/guarding Gait velocity interpretation: <1.8 ft/sec, indicative of risk for recurrent falls General Gait Details: pt req 2 seated rest breaks during amb. rest breaks were a bit lengthy 3-5 min so she could catch her breath; pt ambulating on 2L O2; cues for gt sequencing and upright posture     Posture / Balance      Special needs/care consideration BiPAP/CPAP No CPM No Continuous Drip IV No Dialysis No      Life Vest No Oxygen Yes, O2 2L [place] Special Bed No Trach Size No Wound Vac (area) No       Skin Has sternal incision and 2 healing incisions on right leg                               Bowel mgmt: Last BM 11/03/13 Bladder mgmt: Voiding with urgency and incontinence Diabetic mgmt Yes, managed on medications at home.  CBG QID in hospital    Previous Home Environment Living Arrangements: Spouse/significant other Available Help at Discharge: Family;Available 24 hours/day Type of Home: Apartment Home Layout: One level Home Access: Stairs to enter Entrance Stairs-Rails: None Entrance Stairs-Number of Steps: 6 Bathroom Shower/Tub: Chiropodist: Standard Home Care Services: No Additional Comments: spouse has early dementia;  grandson & son are caring for him; pt to go stay with her daughter  Discharge Living Setting Plans for Discharge Living Setting: Lives with (comment);Apartment (Plans to go home with daughter, [name].) Type of Home at Discharge: Apartment Discharge Home Layout: One level Discharge Home Access: Stairs to enter Entrance Stairs-Number of Steps: 7 steps up to apartment, then level inside apartment. Does the patient have any problems obtaining your medications?: No  Social/Family/Support Systems Patient Roles: Spouse;Parent (Husband, 3 children, 10 grandchildren) Contact Information: Trellis Guirguis - spouse (h) [phone] (c) [phone] Anticipated Caregiver: Brigid Re - son from [place]  Leda Min - dtr [phone]) Anticipated Caregiver's Contact Information: [place] [phone] (Dtr stays at home and can assist.) Ability/Limitations of Caregiver: Husband has dementia.  Son here from [place] for about 6 weeks and can assist. Caregiver Availability: 24/7 Discharge Plan Discussed with Primary Caregiver: Yes Is Caregiver In Agreement with Plan?: Yes Does Caregiver/Family have Issues with Lodging/Transportation while Pt is in Rehab?: No  Goals/Additional Needs Patient/Family Goal for Rehab: PT mod I, OT mod I/Supervision goals Expected length of stay: 10-14 days Cultural Considerations: None Dietary Needs: Dys 3, thin liquids Equipment Needs: TBD Pt/Family Agrees to Admission and willing to participate: Yes Program Orientation Provided & Reviewed with Pt/Caregiver Including Roles  & Responsibilities: Yes  Decrease burden of Care through IP rehab admission: N/A  Possible need for SNF placement upon discharge: Not planned  Patient Condition: This patient's medical and functional status has changed since the consult dated: 10/30/13 in which the Rehabilitation Physician determined and documented that the patient's condition is appropriate for intensive rehabilitative care in an  inpatient rehabilitation facility. See "History of Present Illness" (above) for medical update. Functional changes are: Currently requiring mod assist for stand pivot transfers and ambulated 135 ft with cardiac rehab. Patient's medical and functional status update has been discussed with the Rehabilitation physician and patient  remains appropriate for inpatient rehabilitation. Will admit to inpatient rehab today.  Preadmission Screen Completed By:  Retta Diones, 11/05/2013 11:45 AM ______________________________________________________________________   Discussed status with Dr. Naaman Plummer on 11/05/13 at 1144 and received telephone approval for admission today.  Admission Coordinator:  Retta Diones, time1144/Date03/11/15

## 2013-11-05 NOTE — Progress Notes (Signed)
ANTICOAGULATION CONSULT NOTE - Initial Consult  Pharmacy Consult for warfarin  Indication: atrial fibrillation  Allergies  Allergen Reactions  . Tape Rash    Adhesive tape    Patient Measurements:    Vital Signs: Temp: 98.1 F (36.7 C) (03/11 1358) Temp src: Oral (03/11 1710) BP: 109/66 mmHg (03/11 1710) Pulse Rate: 64 (03/11 1710)  Labs:  Recent Labs  11/03/13 0500 11/04/13 0425 11/05/13 0500  HGB 9.8*  --   --   HCT 28.7*  --   --   PLT 441*  --   --   LABPROT 22.1* 23.0* 23.4*  INR 2.00* 2.11* 2.16*  CREATININE 1.58* 1.68*  --     The CrCl is unknown because both a height and weight (above a minimum accepted value) are required for this calculation.   Medical History: Past Medical History  Diagnosis Date  . Arthritis   . Diabetes mellitus   . Heart murmur   . Hyperlipidemia   . Hypertension   . Hypothyroidism   . NSTEMI (non-ST elevated myocardial infarction) 10/20/2013  . Ischemic cardiomyopathy 10/20/2013  . Acute pulmonary edema with congestive heart failure 10/20/2013  . Left bundle branch block (LBBB) on electrocardiogram   . Atrial fibrillation 10/20/2013  . OSTEOARTHRITIS 08/06/2006  . Morbid obesity   . Diabetic neuropathy   . Type II diabetes mellitus with complication [id number]  . Chronic kidney disease   . S/P CABG x 3 10/23/2013    LIMA to LAD, SVG to OM2, SVG to RPLB, EVH via right thigh  . Acute myocardial infarction of anterior wall 10/20/2013    Medications:  Scheduled:  . [START ON 11/06/2013] amiodarone  200 mg Oral Daily  . [START ON 11/06/2013] aspirin EC  81 mg Oral Daily  . atorvastatin  20 mg Oral q1800  . [START ON 11/06/2013] bisacodyl  10 mg Oral Daily   Or  . [START ON 11/06/2013] bisacodyl  10 mg Rectal Daily  . [START ON 11/06/2013] carvedilol  6.25 mg Oral BID WC  . [START ON 11/06/2013] docusate sodium  200 mg Oral Daily  . feeding supplement (RESOURCE BREEZE)  1 Container Oral TID WC  . [START ON 11/06/2013] ferrous  sulfate  325 mg Oral Q breakfast  . furosemide  80 mg Oral BID  . guaiFENesin  600 mg Oral BID  . [START ON 11/06/2013] insulin aspart  0-20 Units Subcutaneous TID WC  . [START ON 11/06/2013] levothyroxine  112 mcg Oral QAC breakfast  . [START ON 11/06/2013] lisinopril  2.5 mg Oral Daily  . [START ON 11/06/2013] pantoprazole  40 mg Oral Daily  . [START ON 11/06/2013] potassium chloride  20 mEq Oral Daily  . [START ON 11/06/2013] prednisoLONE acetate  1 drop Right Eye Daily  . warfarin  2.5 mg Oral q1800  . Warfarin - Pharmacist Dosing Inpatient   Does not apply q1800    Assessment: 73 year old woman transferred to rehab unit after CABG surgery on 2/26 to continue on warfarin for AFib.  INR is therapeutic at 2.16.  She has been receiving 2.5mg  daily  Goal of Therapy:  INR 2-3    Plan:  Warfarin 2.5mg  daily. Daily INR.  Candie Mile 11/05/2013,5:45 PM

## 2013-11-05 NOTE — Interval H&P Note (Signed)
Elaine [name] was admitted today to Inpatient Rehabilitation with the diagnosis of deconditioning after CABG and multiple medical.  The patient's history has been reviewed, patient examined, and there is no change in status.  Patient continues to be appropriate for intensive inpatient rehabilitation.  I have reviewed the patient's chart and labs.  Questions were answered to the patient's satisfaction.  SWARTZ,ZACHARY T 11/05/2013, 5:13 PM

## 2013-11-06 ENCOUNTER — Inpatient Hospital Stay (HOSPITAL_COMMUNITY): Payer: Medicare Other | Admitting: Occupational Therapy

## 2013-11-06 ENCOUNTER — Inpatient Hospital Stay (HOSPITAL_COMMUNITY): Payer: Medicare HMO | Admitting: *Deleted

## 2013-11-06 ENCOUNTER — Inpatient Hospital Stay (HOSPITAL_COMMUNITY): Payer: Medicare Other | Admitting: Rehabilitation

## 2013-11-06 DIAGNOSIS — E669 Obesity, unspecified: Secondary | ICD-10-CM

## 2013-11-06 DIAGNOSIS — I10 Essential (primary) hypertension: Secondary | ICD-10-CM

## 2013-11-06 DIAGNOSIS — E119 Type 2 diabetes mellitus without complications: Secondary | ICD-10-CM

## 2013-11-06 DIAGNOSIS — I2510 Atherosclerotic heart disease of native coronary artery without angina pectoris: Secondary | ICD-10-CM

## 2013-11-06 DIAGNOSIS — R5381 Other malaise: Secondary | ICD-10-CM

## 2013-11-06 LAB — COMPREHENSIVE METABOLIC PANEL
ALT: 36 U/L — ABNORMAL HIGH (ref 0–35)
AST: 20 U/L (ref 0–37)
Albumin: 2.5 g/dL — ABNORMAL LOW (ref 3.5–5.2)
Alkaline Phosphatase: 64 U/L (ref 39–117)
BUN: 41 mg/dL — AB (ref 6–23)
CALCIUM: 9 mg/dL (ref 8.4–10.5)
CO2: 26 mEq/L (ref 19–32)
CREATININE: 1.77 mg/dL — AB (ref 0.50–1.10)
Chloride: 98 mEq/L (ref 96–112)
GFR calc non Af Amer: 27 mL/min — ABNORMAL LOW (ref >90)
GFR, EST AFRICAN AMERICAN: 32 mL/min — AB (ref >90)
Glucose, Bld: 112 mg/dL — ABNORMAL HIGH (ref 70–99)
Potassium: 4.6 mEq/L (ref 3.7–5.3)
Sodium: 138 mEq/L (ref 137–147)
TOTAL PROTEIN: 5.9 g/dL — AB (ref 6.0–8.3)
Total Bilirubin: 0.5 mg/dL (ref 0.3–1.2)

## 2013-11-06 LAB — PROTIME-INR
INR: 2.37 — ABNORMAL HIGH (ref 0.00–1.49)
PROTHROMBIN TIME: 25.1 s — AB (ref 11.6–15.2)

## 2013-11-06 LAB — CBC WITH DIFFERENTIAL/PLATELET
Basophils Absolute: 0 10*3/uL (ref 0.0–0.1)
Basophils Relative: 0 % (ref 0–1)
EOS ABS: 0.1 10*3/uL (ref 0.0–0.7)
Eosinophils Relative: 2 % (ref 0–5)
HCT: 27.1 % — ABNORMAL LOW (ref 36.0–46.0)
HEMOGLOBIN: 9.2 g/dL — AB (ref 12.0–15.0)
LYMPHS ABS: 1.4 10*3/uL (ref 0.7–4.0)
Lymphocytes Relative: 17 % (ref 12–46)
MCH: 28.8 pg (ref 26.0–34.0)
MCHC: 33.9 g/dL (ref 30.0–36.0)
MCV: 84.7 fL (ref 78.0–100.0)
MONO ABS: 0.8 10*3/uL (ref 0.1–1.0)
MONOS PCT: 9 % (ref 3–12)
Neutro Abs: 5.9 10*3/uL (ref 1.7–7.7)
Neutrophils Relative %: 71 % (ref 43–77)
Platelets: 422 10*3/uL — ABNORMAL HIGH (ref 150–400)
RBC: 3.2 MIL/uL — AB (ref 3.87–5.11)
RDW: 16.4 % — ABNORMAL HIGH (ref 11.5–15.5)
WBC: 8.3 10*3/uL (ref 4.0–10.5)

## 2013-11-06 LAB — GLUCOSE, CAPILLARY
GLUCOSE-CAPILLARY: 116 mg/dL — AB (ref 70–99)
GLUCOSE-CAPILLARY: 225 mg/dL — AB (ref 70–99)
Glucose-Capillary: 192 mg/dL — ABNORMAL HIGH (ref 70–99)
Glucose-Capillary: 94 mg/dL (ref 70–99)

## 2013-11-06 MED ORDER — FUROSEMIDE 40 MG PO TABS
60.0000 mg | ORAL_TABLET | Freq: Two times a day (BID) | ORAL | Status: DC
  Filled 2013-11-06 (×5): qty 1

## 2013-11-06 NOTE — Plan of Care (Signed)
Problem: RH BOWEL ELIMINATION Goal: RH STG MANAGE BOWEL WITH ASSISTANCE STG Manage Bowel with Assistance.  Outcome: Not Applicable Date Met:  65/68/12 Duplicate category

## 2013-11-06 NOTE — Progress Notes (Signed)
Social Work Assessment and Plan Social Work Assessment and Plan  Patient Details  Name: Elaine [name] MRN: [id number] Date of Birth: May 30, 1941  Today's Date: 11/06/2013  Problem List:  Patient Active Problem List   Diagnosis Date Noted  . Physical deconditioning 11/05/2013  . S/P CABG x 3 10/23/2013  . Atrial fibrillation 10/20/2013  . Left bundle branch block (LBBB) on electrocardiogram 10/20/2013  . Acute pulmonary edema with congestive heart failure 10/20/2013  . Left main coronary artery disease 10/20/2013    Class: Hospitalized for  . Acute myocardial infarction of anterior wall 10/20/2013  . Ischemic cardiomyopathy 10/20/2013  . Morbid obesity   . Diabetic neuropathy   . Chronic kidney disease   . Obesity 12/20/2006  . Type II diabetes mellitus with complication [id number]  . HYPERLIPIDEMIA 08/06/2006  . HYPERTENSION 08/06/2006  . OSTEOARTHRITIS 08/06/2006   Past Medical History:  Past Medical History  Diagnosis Date  . Arthritis   . Diabetes mellitus   . Heart murmur   . Hyperlipidemia   . Hypertension   . Hypothyroidism   . NSTEMI (non-ST elevated myocardial infarction) 10/20/2013  . Ischemic cardiomyopathy 10/20/2013  . Acute pulmonary edema with congestive heart failure 10/20/2013  . Left bundle branch block (LBBB) on electrocardiogram   . Atrial fibrillation 10/20/2013  . OSTEOARTHRITIS 08/06/2006  . Morbid obesity   . Diabetic neuropathy   . Type II diabetes mellitus with complication [id number]  . Chronic kidney disease   . S/P CABG x 3 10/23/2013    LIMA to LAD, SVG to OM2, SVG to RPLB, EVH via right thigh  . Acute myocardial infarction of anterior wall 10/20/2013   Past Surgical History:  Past Surgical History  Procedure Laterality Date  . Corneal transplant  2011    right eye  . Total hip arthroplasty  2010, 2011    right 2010, left 2011  . Abdominal hysterectomy    . Coronary artery bypass graft N/A 10/23/2013    Procedure: CORONARY ARTERY BYPASS  GRAFTING (CABG);  Surgeon: Rexene Alberts, MD;  Location: [facility];  Service: Open Heart Surgery;  Laterality: N/A;  . Intraoperative transesophageal echocardiogram N/A 10/23/2013    Procedure: INTRAOPERATIVE TRANSESOPHAGEAL ECHOCARDIOGRAM;  Surgeon: Rexene Alberts, MD;  Location: [facility];  Service: Open Heart Surgery;  Laterality: N/A;  . Clipping of atrial appendage N/A 10/23/2013    Procedure: CLIPPING OF ATRIAL APPENDAGE;  Surgeon: Rexene Alberts, MD;  Location: [facility];  Service: Open Heart Surgery;  Laterality: N/A;   Social History:  reports that she quit smoking about 20 years ago. She has never used smokeless tobacco. She reports that she does not drink alcohol or use illicit drugs.  Family / Support Systems Marital Status: Married Patient Roles: Spouse;Parent;Caregiver Spouse/Significant Other: Glendell Docker Children: Tamitha Walker-daugther  [phone] Other Supports: Hyman Bower  [id number] [place] Anticipated Caregiver: Will go to daughter's home, where she will have 24 hr care Ability/Limitations of Caregiver: Daughter not working and available Caregiver Availability: 24/7 Family Dynamics: Pt reports she neglected herself by taking care of her husband.  She needs now to take care of her health and her needs.  Her children are all very supportive and involved and will assist both at discharge.  Social History Preferred language: English Religion: Baptist Cultural Background: No issues Education: [employer] Read: Yes Write: Yes Employment Status: Retired Freight forwarder Issues: No history Guardian/Conservator: None-according to MD pt is capable of making her own decisions.  Son is present in  her room also.   Abuse/Neglect Physical Abuse: Denies Verbal Abuse: Denies Sexual Abuse: Denies Exploitation of patient/patient's resources: Denies Self-Neglect: Denies  Emotional Status Pt's affect, behavior adn adjustment status: Pt is motivated and realizes  this will take time to recover from.  She plans to take it one day at a time.  She has always been one to go with the flow.  Her son confirms pt is a person who is laided back and ready for any challenge. Recent Psychosocial Issues: Other medical issues-plans to take care of herself more now.  Caregiver for husband was doing too much for him. Pyschiatric History: No history-deferred depression screen due to doing well and has a very good attitude.  She relies upon her faith and herself.  Will monitor and intervene if needed. Substance Abuse History: No issues  Patient / Family Perceptions, Expectations & Goals Pt/Family understanding of illness & functional limitations: Pt and son have a good understanding of her condition and surgery.  He reports someone will be with her and they will do what is needed for her.  Main issues are her endurance and strength while here.  She is doing well for her first day of rehab. 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, Mother, Building control surveyor, Retiree, Church member, etc Anticipated changes in roles/activities/participation: resume Pt/family expectations/goals: Pt states: " I want to do as much as I can for myself before I leave here."  Son states; " She will work hard she always has."  [employer]: None Premorbid Home Care/DME Agencies: None Transportation available at discharge: [employer] referrals recommended: Support group (specify) (Caregivers Support group)  Discharge Planning Living Arrangements: Spouse/significant other Support Systems: Spouse/significant other;Children;Other relatives;Friends/neighbors;Church/faith community Type of Residence: Private residence Insurance Resources: Multimedia programmer (specify) Scientist, clinical (histocompatibility and immunogenetics) Medicare) Financial Resources: Radio broadcast assistant Screen Referred: No Living Expenses: Lives with family Money Management: Patient Does the patient have any problems obtaining your medications?:  No Home Management: Patient Patient/Family Preliminary Plans: Pt plans to go to daughter's home upon discharge then eventually home with her husband.  Her son is here along with grandson taking care of husband now.  But he needs to retrun to [place] in 6 weeks.  Husband has mild dementia and needs hlep with medicines and home management.  Children aware  may need to assist them more so Mom doesn't over do. Social Work Anticipated Follow Up Needs: HH/OP;Support Group  Clinical Impression Pleasant female along with her son who are a breath of fresh air.  Pt is one that goes with the flow and realizes it will take time to heal and recover.  She will have care at discharge via Daughter.  Await team's evaluations and work on discharge plans.  Elease Hashimoto 11/06/2013, 11:12 AM

## 2013-11-06 NOTE — Progress Notes (Signed)
ANTICOAGULATION CONSULT NOTE - Initial Consult  Pharmacy Consult for warfarin  Indication: atrial fibrillation  Allergies  Allergen Reactions  . Tape Rash    Adhesive tape    Patient Measurements: Weight: 268 lb 15.4 oz (122 kg)  Vital Signs: Temp: 98.3 F (36.8 C) (03/12 0534) Temp src: Oral (03/12 0534) BP: 92/58 mmHg (03/12 1057) Pulse Rate: 68 (03/12 1057)  Labs:  Recent Labs  11/04/13 0425 11/05/13 0500 11/06/13 0520  HGB  --   --  9.2*  HCT  --   --  27.1*  PLT  --   --  422*  LABPROT 23.0* 23.4* 25.1*  INR 2.11* 2.16* 2.37*  CREATININE 1.68*  --  1.77*    The CrCl is unknown because both a height and weight (above a minimum accepted value) are required for this calculation.   Medical History: Past Medical History  Diagnosis Date  . Arthritis   . Diabetes mellitus   . Heart murmur   . Hyperlipidemia   . Hypertension   . Hypothyroidism   . NSTEMI (non-ST elevated myocardial infarction) 10/20/2013  . Ischemic cardiomyopathy 10/20/2013  . Acute pulmonary edema with congestive heart failure 10/20/2013  . Left bundle branch block (LBBB) on electrocardiogram   . Atrial fibrillation 10/20/2013  . OSTEOARTHRITIS 08/06/2006  . Morbid obesity   . Diabetic neuropathy   . Type II diabetes mellitus with complication [id number]  . Chronic kidney disease   . S/P CABG x 3 10/23/2013    LIMA to LAD, SVG to OM2, SVG to RPLB, EVH via right thigh  . Acute myocardial infarction of anterior wall 10/20/2013    Medications:  Scheduled:  . amiodarone  200 mg Oral Daily  . aspirin EC  81 mg Oral Daily  . atorvastatin  20 mg Oral q1800  . bisacodyl  10 mg Oral Daily   Or  . bisacodyl  10 mg Rectal Daily  . carvedilol  6.25 mg Oral BID WC  . docusate sodium  200 mg Oral Daily  . feeding supplement (RESOURCE BREEZE)  1 Container Oral TID WC  . ferrous sulfate  325 mg Oral Q breakfast  . furosemide  60 mg Oral BID  . guaiFENesin  600 mg Oral BID  . insulin aspart  0-20  Units Subcutaneous TID WC  . levothyroxine  112 mcg Oral QAC breakfast  . lisinopril  2.5 mg Oral Daily  . pantoprazole  40 mg Oral Daily  . potassium chloride  20 mEq Oral Daily  . prednisoLONE acetate  1 drop Right Eye Daily  . warfarin  2.5 mg Oral q1800  . Warfarin - Pharmacist Dosing Inpatient   Does not apply q1800    Assessment: 73 year old woman transferred to rehab unit after CABG surgery on 2/26 to continue on warfarin for AFib.  INR is therapeutic at 2.37. She has been receiving 2.5mg  daily in the past 4 days.   Goal of Therapy:  INR 2-3    Plan:  Warfarin 2.5mg  daily. Daily INR.  Manley Mason 11/06/2013,11:46 AM

## 2013-11-06 NOTE — Progress Notes (Signed)
Patient information reviewed and entered into eRehab system by See Beharry, RN, CRRN, PPS Coordinator.  Information including medical coding and functional independence measure will be reviewed and updated through discharge.     Per nursing patient was given "Data Collection Information Summary for Patients in Inpatient Rehabilitation Facilities with attached "Privacy Act Statement-Health Care Records" upon admission.  

## 2013-11-06 NOTE — Progress Notes (Signed)
Physical Therapy Session Note  Patient Details  Name: Elaine [name] MRN: [id number] Date of Birth: 1940/12/01  Today's Date: 11/06/2013 Time: [id number] Time Calculation (min): 31 min  Short Term Goals: Week 1:  PT Short Term Goal 1 (Week 1): Patient will perform bed mobility with supervision. PT Short Term Goal 2 (Week 1): Patient will perform functional transfers with minA while maintaining sternal precautions. PT Short Term Goal 3 (Week 1): Patient will ambulate 58' with RW and supervision. PT Short Term Goal 4 (Week 1): Patient will negotiate 4 stairs with one handrail and supervision. PT Short Term Goal 5 (Week 1): Patient will propel wheelchair 100' with B LEs and superivison.  Skilled Therapeutic Interventions/Progress Updates:   Pt received sitting in recliner, agreeable to therapy session.  Pt did not wish to get back into bed, therefore session focused on w/c mobility for improved technique, increased BLE strength and to increase activity tolerance.  Performed approx 30' with BLEs (shoes donned for ease of propulsion) with intermittent rest breaks due to SOB/fatigue.  Pt ambulated approx 10' x 2 reps to get to/from recliner to w/c with RW at min/guard level.  Pt left in recliner with all needs in reach.    Performed all mobility on 2L O2 with SaO2 remaining in upper 90's throughout session.   Therapy Documentation Precautions:  Precautions Precautions: Sternal;Fall Precaution Comments: Rt UE PICC line, Sternal precautions. NO UE AROM > 90 degrees Restrictions Weight Bearing Restrictions: No Other Position/Activity Restrictions: pt able to state no pushing with UEs and not to raise UEs above 90 degrees General:   Vital Signs: Therapy Vitals Temp: 97.6 F (36.4 C) Temp src: Oral Pulse Rate: 63 Resp: 18 BP: 97/56 mmHg Patient Position, if appropriate: Sitting Oxygen Therapy SpO2: 98 % O2 Device: Nasal cannula O2 Flow Rate (L/min): 2 L/min Pain:   Mobility:    Locomotion :    Trunk/Postural Assessment :    Balance:   Exercises:   Other Treatments:    See FIM for current functional status  Therapy/Group: Individual Therapy  Denice Bors 11/06/2013, 7:36 PM

## 2013-11-06 NOTE — Care Management Note (Signed)
Inpatient [facility] Individual Statement of Services  Patient Name:  Elaine [name]  Date:  11/06/2013  Welcome to the [facility].  Our goal is to provide you with an individualized program based on your diagnosis and situation, designed to meet your specific needs.  With this comprehensive rehabilitation program, you will be expected to participate in at least 3 hours of rehabilitation therapies Monday-Friday, with modified therapy programming on the weekends.  Your rehabilitation program will include the following services:  Physical Therapy (PT), Occupational Therapy (OT), 24 hour per day rehabilitation nursing, Therapeutic Recreaction (TR), Case Management (Social Worker), Rehabilitation Medicine, Nutrition Services and Pharmacy Services  Weekly team conferences will be held on Tuesday to discuss your progress.  Your Social Worker will talk with you frequently to get your input and to update you on team discussions.  Team conferences with you and your family in attendance may also be held.  Expected length of stay: 12-14 days  Overall anticipated outcome: mod/i level  Depending on your progress and recovery, your program may change. Your Social Worker will coordinate services and will keep you informed of any changes. Your Social Worker's name and contact numbers are listed  below.  The following services may also be recommended but are not provided by the [facility] will be made to provide these services after discharge if needed.  Arrangements include referral to agencies that provide these services.  Your insurance has been verified to be:  [employer] Your primary doctor is:  Dr Marton Redwood  Pertinent information will be shared with your doctor and your insurance company.  Social Worker:  Ovidio Kin, [place] or [phone]  Information discussed with and copy given to patient by: Elease Hashimoto, 11/06/2013, 10:54 AM

## 2013-11-06 NOTE — Evaluation (Signed)
Physical Therapy Assessment and Plan  Patient Details  Name: Elaine [name] MRN: [id number] Date of Birth: [birth year]  PT Diagnosis: Difficulty walking, Muscle weakness and Pain in R LE from surgery Rehab Potential: Good ELOS: 12-14 days   Today's Date: 11/06/2013 Time: 8250-0370 Time Calculation (min): 73 min  Problem List:  Patient Active Problem List   Diagnosis Date Noted  . Physical deconditioning 11/05/2013  . S/P CABG x 3 10/23/2013  . Atrial fibrillation 10/20/2013  . Left bundle branch block (LBBB) on electrocardiogram 10/20/2013  . Acute pulmonary edema with congestive heart failure 10/20/2013  . Left main coronary artery disease 10/20/2013    Class: Hospitalized for  . Acute myocardial infarction of anterior wall 10/20/2013  . Ischemic cardiomyopathy 10/20/2013  . Morbid obesity   . Diabetic neuropathy   . Chronic kidney disease   . Obesity 12/20/2006  . Type II diabetes mellitus with complication 48/88/9169  . HYPERLIPIDEMIA 08/06/2006  . HYPERTENSION 08/06/2006  . OSTEOARTHRITIS 08/06/2006    Past Medical History:  Past Medical History  Diagnosis Date  . Arthritis   . Diabetes mellitus   . Heart murmur   . Hyperlipidemia   . Hypertension   . Hypothyroidism   . NSTEMI (non-ST elevated myocardial infarction) 10/20/2013  . Ischemic cardiomyopathy 10/20/2013  . Acute pulmonary edema with congestive heart failure 10/20/2013  . Left bundle branch block (LBBB) on electrocardiogram   . Atrial fibrillation 10/20/2013  . OSTEOARTHRITIS 08/06/2006  . Morbid obesity   . Diabetic neuropathy   . Type II diabetes mellitus with complication 45/10/8880  . Chronic kidney disease   . S/P CABG x 3 10/23/2013    LIMA to LAD, SVG to OM2, SVG to RPLB, EVH via right thigh  . Acute myocardial infarction of anterior wall 10/20/2013   Past Surgical History:  Past Surgical History  Procedure Laterality Date  . Corneal transplant  2011    right eye  . Total hip arthroplasty   2010, 2011    right 2010, left 2011  . Abdominal hysterectomy    . Coronary artery bypass graft N/A 10/23/2013    Procedure: CORONARY ARTERY BYPASS GRAFTING (CABG);  Surgeon: Rexene Alberts, MD;  Location: [facility];  Service: Open Heart Surgery;  Laterality: N/A;  . Intraoperative transesophageal echocardiogram N/A 10/23/2013    Procedure: INTRAOPERATIVE TRANSESOPHAGEAL ECHOCARDIOGRAM;  Surgeon: Rexene Alberts, MD;  Location: [facility];  Service: Open Heart Surgery;  Laterality: N/A;  . Clipping of atrial appendage N/A 10/23/2013    Procedure: CLIPPING OF ATRIAL APPENDAGE;  Surgeon: Rexene Alberts, MD;  Location: [facility];  Service: Open Heart Surgery;  Laterality: N/A;    Assessment & Plan Clinical Impression:  Elaine [name] is a 73 y.o. right-handed female with history of hypertension, type 2 diabetes mellitus, obesity and former tobacco abuse. Patient independent prior to admission caring for her husband. Admitted 10/20/2013 with persistent chest pressure and shortness of breath with lower extremity edema. Noted to be in atrial fibrillation with rate in the low 100s. There was some ST segment abnormalities concerning for possible STEMI. Cardiac catheterization demonstrates moderate left main coronary artery stenosis with high-grade proximal stenosis of the left anterior descending coronary artery and three-vessel disease. Underwent CABG x3 as well as clipping of left atrial appendage 10/23/2013 per Dr. Roxy Manns. Hospital course orthostasis maintained on dopamine. Bouts of fluid overload with diuresis ongoing. Acute blood loss anemia 9.8 and monitored on iron supplement. Coumadin initiated in relation to atrial fibrillation bypass  surgery. . She is tolerating a mechanical soft diet. Physical therapy evaluation completed 10/26/2013 with sternal precautions. Recommendations have been made for physical medicine rehabilitation consult. Patient was admitted for comprehensive rehabilitation program. Patient transferred to  [facility] on 11/05/2013 .   Patient currently requires min- mod with mobility secondary to muscle weakness, decreased cardiorespiratoy endurance and decreased oxygen support and decreased standing balance and decreased balance strategies.  Prior to hospitalization, patient was modified independent  with mobility and lived with Spouse in a Apartment (will d/c to daughter's apt) home.  Home access is 4Stairs to enter.  Patient will benefit from skilled PT intervention to maximize safe functional mobility, minimize fall risk and decrease caregiver burden for planned discharge home with 24 hour assist and supervision when needed, however patient goals set for mod I within the home and supervision within the community; minA for stair negotiation..  Anticipate patient will benefit from follow up [place] at discharge.  PT - End of Session Activity Tolerance: Tolerates 30+ min activity with multiple rests Endurance Deficit: Yes Endurance Deficit Description: decr activity tolerance, frequent rest breaks PT Assessment Rehab Potential: Good Barriers to Discharge: Inaccessible home environment Barriers to Discharge Comments: daughter's apt has 4 STE without handrails PT Patient demonstrates impairments in the following area(s): Balance;Edema;Endurance;Motor;Pain;Safety;Skin Integrity PT Transfers Functional Problem(s): Bed Mobility;Bed to Chair;Car;Furniture PT Locomotion Functional Problem(s): Ambulation;Wheelchair Mobility;Stairs PT Plan PT Intensity: Minimum of 1-2 x/day ,45 to 90 minutes PT Frequency: 5 out of 7 days PT Duration Estimated Length of Stay: 12-14 days PT Treatment/Interventions: Ambulation/gait training;Balance/vestibular training;Community reintegration;Discharge planning;Neuromuscular re-education;Functional mobility training;DME/adaptive equipment instruction;Disease management/prevention;Pain management;Patient/family education;Psychosocial support;Skin care/wound  management;Splinting/orthotics;UE/LE Coordination activities;UE/LE Strength taining/ROM;Therapeutic Exercise;Therapeutic Activities;Wheelchair propulsion/positioning;Stair training PT Transfers Anticipated Outcome(s): mod I PT Locomotion Anticipated Outcome(s): mod I household, supervision community; minA stairs PT Recommendation Recommendations for Other Services:  (none) Follow Up Recommendations: Home health PT Patient destination: Home Equipment Recommended: Rolling walker with 5" wheels;Wheelchair (measurements);Wheelchair cushion (measurements);To be determined Equipment Details: Patient owns rollator; Further recommendations TBD upon discharge.  Skilled Therapeutic Intervention Skilled therapeutic intervention initiated after completion of evaluation. Discussed falls risk, safety within room, and focus of therapy during stay. Discussed possible LOS, goals, and f/u therapy with patient and son.  Vitals during evaluation: See below for at rest. -s/p ambulation: SpO2: 99%, HR: 69 -s/p 3 stairs: SpO2: 98%, HR: 69   PT Evaluation Precautions/Restrictions Precautions Precautions: Sternal;Fall Precaution Comments: Rt UE PICC line, Sternal precautions. NO UE AROM > 90 degrees Restrictions Weight Bearing Restrictions: No Other Position/Activity Restrictions: pt able to state no pushing with UEs and not to raise UEs above 90 degrees General Chart Reviewed: Yes Family/Caregiver Present: Yes (son)  Vital SignsTherapy Vitals Pulse Rate: 68 BP: 92/58 mmHg Patient Position, if appropriate: Sitting Oxygen Therapy SpO2: 97 % O2 Device: Nasal cannula O2 Flow Rate (L/min): 2 L/min Pulse Oximetry Type: Intermittent Pain Pain Assessment Pain Assessment: 0-10 Pain Score: 3  Pain Type: Surgical pain Pain Location: Leg Pain Orientation: Right Pain Descriptors / Indicators: Sore Pain Onset: With Activity Pain Intervention(s): RN made aware;Repositioned;Ambulation/increased  activity Multiple Pain Sites: No Home Living/Prior Functioning Home Living Available Help at Discharge: Family;Available 24 hours/day Type of Home: Apartment (will d/c to daughter's apt) Home Access: Stairs to enter Entrance Stairs-Number of Steps: 4 Entrance Stairs-Rails: None Home Layout: One level Additional Comments: spouse has early dementia and will stay at his home with son (A). Pt will d/c to daughters home which is also an apartment.   Lives With: Spouse Prior  Function Level of Independence: Independent with basic ADLs;Independent with homemaking with ambulation;Requires assistive device for independence;Independent with transfers;Independent with gait  Able to Take Stairs?: Yes Driving: Yes Vocation: Retired Comments: used cane outside and rollator; used seat in Tax inspector - History Baseline Vision: Wears glasses only for reading Visual History: Corrective eye surgery (corneal transplant) Patient Visual Report: No change from baseline Vision - Assessment Eye Alignment: Within Functional Limits Vision Assessment: Vision not tested Perception Perception: Within Functional Limits Praxis Praxis: Intact  Cognition Overall Cognitive Status: Within Functional Limits for tasks assessed Arousal/Alertness: Awake/alert Orientation Level: Oriented X4 Memory: Appears intact Awareness: Appears intact Safety/Judgment: Appears intact Sensation Sensation Light Touch: Appears Intact Proprioception: Appears Intact Coordination Gross Motor Movements are Fluid and Coordinated: Yes Fine Motor Movements are Fluid and Coordinated: Yes Motor  Motor Motor: Within Functional Limits Motor - Skilled Clinical Observations: generalized weakness  Mobility Bed Mobility Bed Mobility: Not assessed Transfers Transfers: Yes Sit to Stand: 3: Mod assist;From elevated surface;With upper extremity assist;From chair/3-in-1;With armrests Sit to Stand Details: Manual  facilitation for weight shifting;Visual cues/gestures for precautions/safety;Verbal cues for precautions/safety Sit to Stand Details (indicate cue type and reason): Patient uses rocking/momentum and light use of B UEs to assist with sit<>stand. Stand to Sit: 4: Min assist;With upper extremity assist;To chair/3-in-1 Stand to Sit Details (indicate cue type and reason): Verbal cues for precautions/safety;Manual facilitation for weight shifting;Visual cues/gestures for precautions/safety Stand to Sit Details: Patient uses rocking/momentum and light use of B UEs to assist with sit<>stand. Stand Pivot Transfers: 3: Mod assist;With armrests Stand Pivot Transfer Details: Visual cues/gestures for precautions/safety;Verbal cues for precautions/safety;Manual facilitation for weight shifting Stand Pivot Transfer Details (indicate cue type and reason): with RW Locomotion  Ambulation Ambulation: Yes Ambulation/Gait Assistance: 4: Min assist Ambulation Distance (Feet): 45 Feet, 12 feet Assistive device: Rolling walker Ambulation/Gait Assistance Details: Verbal cues for precautions/safety;Verbal cues for gait pattern;Verbal cues for safe use of DME/AE;Tactile cues for posture Ambulation/Gait Assistance Details: 56' x1 in controlled environment with RW and minA and 12' x1 in room with RW and minA. 3-4 standing rest breaks during longer bout of ambulation. Gait Gait: Yes Gait Pattern: Impaired Gait Pattern: Wide base of support;Trunk flexed;Step-to pattern;Step-through pattern;Decreased stride length Stairs / Additional Locomotion Stairs: Yes Stairs Assistance: 4: Min assist Stairs Assistance Details: Verbal cues for precautions/safety;Verbal cues for sequencing;Tactile cues for sequencing Stair Management Technique: Two rails;Step to pattern;Forwards Number of Stairs: 3 Height of Stairs: 5 Wheelchair Mobility Wheelchair Mobility: Yes Wheelchair Assistance: 1: +1 Total assist (secondary to sternal  precautions and no shoes to propel with B LEs) Wheelchair Parts Management: Needs assistance Distance: 150  Trunk/Postural Assessment  Cervical Assessment Cervical Assessment: Within Functional Limits Thoracic Assessment Thoracic Assessment: Within Functional Limits Lumbar Assessment Lumbar Assessment: Within Functional Limits Postural Control Postural Control: Within Functional Limits  Balance Balance Balance Assessed: Yes Static Sitting Balance Static Sitting - Balance Support: No upper extremity supported;Bilateral upper extremity supported;Feet supported Static Sitting - Level of Assistance: 6: Modified independent (Device/Increase time) Dynamic Sitting Balance Dynamic Sitting - Balance Support: Bilateral upper extremity supported;No upper extremity supported;Feet supported;During functional activity Dynamic Sitting - Level of Assistance: 5: Stand by assistance Static Standing Balance Static Standing - Balance Support: Bilateral upper extremity supported;During functional activity Static Standing - Level of Assistance: 4: Min assist Extremity Assessment  RUE Assessment RUE Assessment: Within Functional Limits LUE Assessment LUE Assessment: Within Functional Limits RLE Assessment RLE Assessment: Within Functional Limits (functionall 3/5) LLE Assessment LLE Assessment: Within  Functional Limits (functionally 3/5)  FIM:  FIM - Bed/Chair Transfer Bed/Chair Transfer Assistive Devices: Arm rests Bed/Chair Transfer: 3: Bed > Chair or W/C: Mod A (lift or lower assist);3: Chair or W/C > Bed: Mod A (lift or lower assist) FIM - Locomotion: Wheelchair Distance: 150 Locomotion: Wheelchair: 0: Activity did not occur (secondary to sternal precautions) FIM - Locomotion: Ambulation Locomotion: Ambulation Assistive Devices: Administrator Ambulation/Gait Assistance: 4: Min assist Locomotion: Ambulation: 1: Travels less than 50 ft with minimal assistance (Pt.>75%) FIM - Locomotion:  Stairs Locomotion: Scientist, physiological: Hand rail - 2 Locomotion: Stairs: 1: Up and Down < 4 stairs with minimal assistance (Pt.>75%)   Refer to Care Plan for Long Term Goals  Recommendations for other services: None  Discharge Criteria: Patient will be discharged from PT if patient refuses treatment 3 consecutive times without medical reason, if treatment goals not met, if there is a change in medical status, if patient makes no progress towards goals or if patient is discharged from hospital.  The above assessment, treatment plan, treatment alternatives and goals were discussed and mutually agreed upon: by patient and by family  Lillia Abed. Delle Andrzejewski, PT, DPT 11/06/2013, 10:59 AM

## 2013-11-06 NOTE — Evaluation (Signed)
Occupational Therapy Assessment and Plan  Patient Details  Name: Elaine [name] MRN: [id number] Date of Birth: 10-03-1940  OT Diagnosis: acute pain, muscle weakness (generalized) and swelling of limb Rehab Potential: Rehab Potential: Excellent ELOS: 12-14 days   Today's Date: 11/06/2013 Time:  - 800-900 60 minutes     Problem List:  Patient Active Problem List   Diagnosis Date Noted  . Physical deconditioning 11/05/2013  . S/P CABG x 3 10/23/2013  . Atrial fibrillation 10/20/2013  . Left bundle branch block (LBBB) on electrocardiogram 10/20/2013  . Acute pulmonary edema with congestive heart failure 10/20/2013  . Left main coronary artery disease 10/20/2013    Class: Hospitalized for  . Acute myocardial infarction of anterior wall 10/20/2013  . Ischemic cardiomyopathy 10/20/2013  . Morbid obesity   . Diabetic neuropathy   . Chronic kidney disease   . Obesity 12/20/2006  . Type II diabetes mellitus with complication 09/47/0962  . HYPERLIPIDEMIA 08/06/2006  . HYPERTENSION 08/06/2006  . OSTEOARTHRITIS 08/06/2006    Past Medical History:  Past Medical History  Diagnosis Date  . Arthritis   . Diabetes mellitus   . Heart murmur   . Hyperlipidemia   . Hypertension   . Hypothyroidism   . NSTEMI (non-ST elevated myocardial infarction) 10/20/2013  . Ischemic cardiomyopathy 10/20/2013  . Acute pulmonary edema with congestive heart failure 10/20/2013  . Left bundle branch block (LBBB) on electrocardiogram   . Atrial fibrillation 10/20/2013  . OSTEOARTHRITIS 08/06/2006  . Morbid obesity   . Diabetic neuropathy   . Type II diabetes mellitus with complication 83/66/2947  . Chronic kidney disease   . S/P CABG x 3 10/23/2013    LIMA to LAD, SVG to OM2, SVG to RPLB, EVH via right thigh  . Acute myocardial infarction of anterior wall 10/20/2013   Past Surgical History:  Past Surgical History  Procedure Laterality Date  . Corneal transplant  2011    right eye  . Total hip  arthroplasty  2010, 2011    right 2010, left 2011  . Abdominal hysterectomy    . Coronary artery bypass graft N/A 10/23/2013    Procedure: CORONARY ARTERY BYPASS GRAFTING (CABG);  Surgeon: Rexene Alberts, MD;  Location: [place];  Service: Open Heart Surgery;  Laterality: N/A;  . Intraoperative transesophageal echocardiogram N/A 10/23/2013    Procedure: INTRAOPERATIVE TRANSESOPHAGEAL ECHOCARDIOGRAM;  Surgeon: Rexene Alberts, MD;  Location: [place];  Service: Open Heart Surgery;  Laterality: N/A;  . Clipping of atrial appendage N/A 10/23/2013    Procedure: CLIPPING OF ATRIAL APPENDAGE;  Surgeon: Rexene Alberts, MD;  Location: [place];  Service: Open Heart Surgery;  Laterality: N/A;    Assessment & Plan Clinical Impression: .Elaine [name] is a 73 y.o. RH female w/ hx of HTN, type 2 DM, obesity and former tobacco abuse. Independent PTA caring for her husband. Admitted 10/20/13 w/ persistent chest pressure and SOB w/ LE edema. Noted to be in A-fib w/ rate in the low 100s. Underwent CABG x3 and clipping of L atrial appendage 10/23/13. Hospital course orthostasis. Bouts of fluid overload with diuresis ongoing. Acute blood loss anemia 9.8 and monitored on iron supplement.   Patient transferred to CIR on 11/05/2013 .    Patient currently requires max with basic self-care skills secondary to muscle weakness, decreased oxygen support and decreased standing balance and difficulty maintaining precautions.  Prior to hospitalization, patient could complete adls with independent .  Patient will benefit from skilled intervention to decrease level  of assist with basic self-care skills prior to discharge home with care partner.  Anticipate patient will require intermittent supervision and follow up home health.  OT - End of Session Activity Tolerance: Improving Endurance Deficit: Yes Endurance Deficit Description: decr activity tolerance, frequent rest breaks OT Assessment Rehab Potential: Excellent OT Patient  demonstrates impairments in the following area(s): Balance;Edema;Endurance;Pain;Safety;Skin Integrity OT Basic ADL's Functional Problem(s): Eating;Grooming;Bathing;Dressing;Toileting OT Advanced ADL's Functional Problem(s): Simple Meal Preparation;Laundry;Light Housekeeping OT Transfers Functional Problem(s): Toilet;Tub/Shower OT Additional Impairment(s): None OT Plan OT Intensity: Minimum of 1-2 x/day, 45 to 90 minutes OT Frequency: 5 out of 7 days OT Duration/Estimated Length of Stay: 12-14 days OT Treatment/Interventions: Balance/vestibular training;Discharge planning;DME/adaptive equipment instruction;Functional mobility training;Pain management;Patient/family education;UE/LE Coordination activities;UE/LE Strength taining/ROM;Therapeutic Exercise;Therapeutic Activities;Self Care/advanced ADL retraining;Community reintegration OT Self Feeding Anticipated Outcome(s): mod I OT Basic Self-Care Anticipated Outcome(s): Supervision OT Toileting Anticipated Outcome(s): supervision OT Bathroom Transfers Anticipated Outcome(s): supervision OT Recommendation Patient destination: Home Follow Up Recommendations: None Equipment Recommended: 3 in 1 bedside comode;To be determined   Skilled Therapeutic Intervention Evaluation complete and POC established with patient. Pt in recliner on arrival. Pt demonstrates deficits with sit<>stand and avoiding bil Ue use. Pt educated on rocking to build momentum and minimal UE placement on arm rest. Pt was unable to achieve static standing with hugging pillow no BIL UE use x3 attempts with therapist (A). Pt able to achieve sit<>stand MOD (A) with rocking momentum method. Pt ambulated HHA to sink and descended to chair. Pt able to bath UB with incr time. Pt required sponge for LB bathing with (A) to complete attention to hygiene. Pt total (A) to don bil LE ted hose thigh high. Pt with wound on Rt LE and unable to tolerate knee high teds so thigh high recommended. Pt  noted to have bil LE edema. Pt with son present at evaluation. Pt will have family (A) 24/7 and has very large family. Pt ending session with RN in room and in chair.  Second session 13:45-14:35- Pt in recliner with focus of session on bed mobility. Pt educated and demo provided for use of heart shaped pillow. Pt requesting 3n1 first. Pt voiding bowel and bladder. Pt bowel has a green tint at this time. Pt completing sit<.stand min (A) this session. Pt max (A) to complete sit<>supine. Pt unable to lift BIL LE. Pt educated on staff tilting bed to avoid pulling with BIL UE to scoot toward [place]. Pt hugging heart pillow and log rolled min (A). Pt able to progress bil LE off EOB and complete supine<>sit MIN (A). Pt required extended rest break due to SOB at this time. Pt transferring after rest break to chair with BIL LE elevated on pillow. Pt provided x3 word searches and expressed gratitude.   OT Evaluation Precautions/Restrictions  Precautions Precautions: Sternal;Fall Precaution Comments: Rt UE PICC line, Sternal precautions. NO UE AROM > 90 degrees General   Vital Signs Therapy Vitals Temp: 98.3 F (36.8 C) Temp src: Oral Pulse Rate: 62 Resp: 18 BP: 103/63 mmHg Patient Position, if appropriate: Lying Oxygen Therapy SpO2: 97 % O2 Device: Nasal cannula O2 Flow Rate (L/min): 2 L/min Pain   Home Living/Prior Functioning Home Living Available Help at Discharge: Family;Available 24 hours/day Type of Home: Apartment (plans to d/c to daughters not personal apartment) Home Access: Stairs to enter Technical brewer of Steps: 4 Entrance Stairs-Rails: None Home Layout: One level Additional Comments: spouse has early dementia and will stay at his home with son (A). Pt will d/c to  daughters home which is also an apartment.   Lives With: Spouse IADL History Homemaking Responsibilities: Yes Meal Prep Responsibility: Primary Laundry Responsibility: Primary Cleaning Responsibility:  Primary Bill Paying/Finance Responsibility: Primary Shopping Responsibility: Primary Child Care Responsibility: No Current License: Yes Mode of Transportation: Car Occupation: Retired Leisure and Hobbies: crocheting Prior Function Level of Independence: Independent with basic ADLs;Independent with homemaking with ambulation;Requires assistive device for independence;Independent with transfers;Independent with gait  Able to Take Stairs?: Yes Driving: Yes Vocation: Retired Leisure: Hobbies-yes (Comment) Comments: used cane outside and rollator; used seat in shower ADL ADL ADL Comments: See FIM Vision/Perception  Vision - History Baseline Vision: Wears glasses only for reading Patient Visual Report: No change from baseline Vision - Assessment Eye Alignment: Within Functional Limits Vision Assessment: Vision not tested Perception Perception: Within Functional Limits Praxis Praxis: Intact  Cognition Overall Cognitive Status: Within Functional Limits for tasks assessed Arousal/Alertness: Awake/alert Orientation Level: Oriented X4 Memory: Appears intact Awareness: Appears intact Safety/Judgment: Appears intact Sensation Sensation Light Touch: Appears Intact Coordination Gross Motor Movements are Fluid and Coordinated: No Fine Motor Movements are Fluid and Coordinated: Yes Motor    Mobility  Bed Mobility Bed Mobility: Not assessed Transfers Sit to Stand: 3: Mod assist;From elevated surface;With upper extremity assist;From chair/3-in-1 Sit to Stand Details: Manual facilitation for weight shifting;Visual cues/gestures for precautions/safety Sit to Stand Details (indicate cue type and reason): pt requires rocking to progress and bil UE on arm rest. Pt unable to hung pillow and complete sit<>stand. (x3 attempts at no UE use) Stand to Sit: 4: Min assist;With upper extremity assist;To chair/3-in-1 Stand to Sit Details (indicate cue type and reason): Visual cues/gestures for  precautions/safety Stand to Sit Details: requires use of BIL UE to control descend  Trunk/Postural Assessment  Cervical Assessment Cervical Assessment: Within Functional Limits Thoracic Assessment Thoracic Assessment: Within Functional Limits Lumbar Assessment Lumbar Assessment: Within Functional Limits Postural Control Postural Control: Within Functional Limits  Balance   Extremity/Trunk Assessment RUE Assessment RUE Assessment: Within Functional Limits LUE Assessment LUE Assessment: Within Functional Limits  FIM:  FIM - Eating Eating Activity: 7: Complete independence:no helper FIM - Grooming Grooming Steps: Wash, rinse, dry face;Wash, rinse, dry hands;Oral care, brush teeth, clean dentures Grooming: 4: Patient completes 3 of 4 or 4 of 5 steps FIM - Bathing Bathing Steps Patient Completed: Chest;Right Arm;Left Arm;Abdomen;Front perineal area;Right upper leg;Left upper leg Bathing: 2: Max-Patient completes 3-4 53f10 parts or 25-49%   Refer to Care Plan for Long Term Goals  Recommendations for other services: nutritionist to address diet changes with Heart healthy diet  Discharge Criteria: Patient will be discharged from OT if patient refuses treatment 3 consecutive times without medical reason, if treatment goals not met, if there is a change in medical status, if patient makes no progress towards goals or if patient is discharged from hospital.  The above assessment, treatment plan, treatment alternatives and goals were discussed and mutually agreed upon: by patient  JParke PoissonB 11/06/2013, 9:19 AM  Pager: [phone]

## 2013-11-06 NOTE — Progress Notes (Signed)
Subjective/Complaints: Had a good night. No cough or sob. Denies pain.  A 12 point review of systems has been performed and if not noted above is otherwise negative.   Objective: Vital Signs: Blood pressure 103/63, pulse 62, temperature 98.3 F (36.8 C), temperature source Oral, resp. rate 18, weight 122 kg (268 lb 15.4 oz), SpO2 97.00%. Dg Chest 2 View  11/05/2013   CLINICAL DATA CABG.  Pleural effusion.  EXAM CHEST  2 VIEW  COMPARISON DG CHEST 2 VIEW dated 10/31/2013  FINDINGS PICC line in stable position. Prior CABG. Stable cardiomegaly with normal pulmonary vascularity. Low lung volumes. There is persistent left-sided pleural effusion. Underlying left lower lobe infiltrate and/or atelectasis is most likely present. New small right pleural effusion noted. No pneumothorax. No acute osseous abnormality.  IMPRESSION 1. Persistent left-sided pleural effusion with underlying atelectasis and/or infiltrate. 2. New small right pleural effusion. Mild atelectasis right lung base. 3. Stable cardiomegaly. Prior CABG. Pulmonary vascularity is normal. 4. PICC line in stable position.  SIGNATURE  Electronically Signed   By: Marcello Moores  Register   On: 11/05/2013 08:40    Recent Labs  11/06/13 0520  WBC 8.3  HGB 9.2*  HCT 27.1*  PLT 422*    Recent Labs  11/04/13 0425 11/06/13 0520  NA 139 138  K 4.2 4.6  CL 99 98  GLUCOSE 134* 112*  BUN 36* 41*  CREATININE 1.68* 1.77*  CALCIUM 8.9 9.0   CBG (last 3)   Recent Labs  11/05/13 1111 11/05/13 1608 11/05/13 2124  GLUCAP 141* 135* 170*    Wt Readings from Last 3 Encounters:  11/06/13 122 kg (268 lb 15.4 oz)  11/05/13 120 kg (264 lb 8.8 oz)  11/05/13 120 kg (264 lb 8.8 oz)    Physical Exam:  Constitutional: She is oriented to person, place, and time. No distress.  obese  HENT: only a few remaining teeth. Oral mucosa pink and moist  Head: Normocephalic and atraumatic.  Eyes: EOM are normal.  Right eye with cataracts  Neck:  Normal range of motion. Neck supple. No JVD present. No tracheal deviation present. No thyromegaly present.  Cardiovascular: Normal rate.  Cardiac rate controlled. No murmurs, rubs, or gallops  Respiratory:  Decreased breath sounds at the bases but clear to auscultation. No wheezes, rales, or rhonchi  GI: Soft. Bowel sounds are normal. She exhibits no distension.  Obese  Neurological: She is alert and oriented to person, place, and time.  UE 4/5 prox to distally bilaterally .LLE 3 /5 HF, RLE 1+ HF (pain), BLLE: 3- to 3KE and 4+/5 ADF/APF bilaterally.  Skin:  midline chest incision clean and dry. Leg incision intact/tender along right inner thigh  Psychiatric: She has a normal mood and affect. Her behavior is normal. Judgment and thought content normal   Assessment/Plan: 1. Functional deficits secondary to deconditioning related to multiple medical which require 3+ hours per day of interdisciplinary therapy in a comprehensive inpatient rehab setting. Physiatrist is providing close team supervision and 24 hour management of active medical problems listed below. Physiatrist and rehab team continue to assess barriers to discharge/monitor patient progress toward functional and medical goals. FIM:       FIM - Toileting Toileting: 0: Activity did not occur           Comprehension Comprehension Mode: Auditory Comprehension: 5-Follows basic conversation/direction: With no assist  Expression Expression Mode: Verbal Expression: 5-Expresses complex 90% of the time/cues < 10% of the time  Social Interaction Social Interaction: 7-Interacts appropriately with others - No medications needed.  Problem Solving Problem Solving: 5-Solves basic problems: With no assist  Memory Memory: 6-More than reasonable amt of time  Medical Problem List and Plan:  1. Deconditioning after STEMI/CABG 10/23/2013. Sternal precautions  2. DVT Prophylaxis/Anticoagulation: Coumadin per pharmacy. Latest INR  2.11 Monitor platelet counts and any signs of bleeding  3. Pain Management: Oxycodone as needed. Monitor with increased mobility  4. Neuropsych: This patient is capable of making decisions on her own behalf.  5. Hypertension/atrial fibrillation. Continue amiodarone as directed, Coreg 6.25 mg twice a day, lisinopril 2.5 mg daily. Rate control. No chest pain or shortness of breath  6. Acute on chronic anemia. Latest hemoglobin 9.8. Continue iron supplement  7. Acute pulmonary edema with congestive heart failure.Decrease lasix to 60mg  bid given bun/cr  -continue to follow daily weights and I's and O's  8. Hypothyroidism. Synthroid  9. Type 2 diabetes mellitus. Hemoglobin A1c 5.7. Blood sugars 111-243. Continue sliding scale insulin.   Patient on Glucophage 1000 mg twice a day prior to admission not resumed secondary to elevated creatinine 1.68. Will resume as needed and as possible  -reasonable control at present 10. Hyperlipidemia. Lipitor  LOS (Days) 1 A FACE TO FACE EVALUATION WAS PERFORMED  Quadir Muns T 11/06/2013 8:53 AM

## 2013-11-07 ENCOUNTER — Inpatient Hospital Stay (HOSPITAL_COMMUNITY): Payer: Medicare HMO | Admitting: *Deleted

## 2013-11-07 ENCOUNTER — Inpatient Hospital Stay (HOSPITAL_COMMUNITY): Payer: Medicare Other

## 2013-11-07 DIAGNOSIS — I10 Essential (primary) hypertension: Secondary | ICD-10-CM

## 2013-11-07 DIAGNOSIS — R5381 Other malaise: Secondary | ICD-10-CM

## 2013-11-07 DIAGNOSIS — I2510 Atherosclerotic heart disease of native coronary artery without angina pectoris: Secondary | ICD-10-CM

## 2013-11-07 DIAGNOSIS — E669 Obesity, unspecified: Secondary | ICD-10-CM

## 2013-11-07 DIAGNOSIS — E119 Type 2 diabetes mellitus without complications: Secondary | ICD-10-CM

## 2013-11-07 LAB — BASIC METABOLIC PANEL
BUN: 44 mg/dL — ABNORMAL HIGH (ref 6–23)
CALCIUM: 8.9 mg/dL (ref 8.4–10.5)
CO2: 28 mEq/L (ref 19–32)
Chloride: 98 mEq/L (ref 96–112)
Creatinine, Ser: 1.78 mg/dL — ABNORMAL HIGH (ref 0.50–1.10)
GFR calc Af Amer: 31 mL/min — ABNORMAL LOW (ref >90)
GFR calc non Af Amer: 27 mL/min — ABNORMAL LOW (ref >90)
Glucose, Bld: 122 mg/dL — ABNORMAL HIGH (ref 70–99)
Potassium: 4.2 mEq/L (ref 3.7–5.3)
SODIUM: 140 meq/L (ref 137–147)

## 2013-11-07 LAB — GLUCOSE, CAPILLARY
GLUCOSE-CAPILLARY: 151 mg/dL — AB (ref 70–99)
Glucose-Capillary: 127 mg/dL — ABNORMAL HIGH (ref 70–99)
Glucose-Capillary: 115 mg/dL — ABNORMAL HIGH (ref 70–99)
Glucose-Capillary: 212 mg/dL — ABNORMAL HIGH (ref 70–99)

## 2013-11-07 LAB — PROTIME-INR
INR: 2.44 — ABNORMAL HIGH (ref 0.00–1.49)
Prothrombin Time: 25.7 seconds — ABNORMAL HIGH (ref 11.6–15.2)

## 2013-11-07 MED ORDER — FUROSEMIDE 40 MG PO TABS
40.0000 mg | ORAL_TABLET | Freq: Two times a day (BID) | ORAL | Status: DC
  Administered 2013-11-07 – 2013-11-12 (×9): 40 mg via ORAL
  Filled 2013-11-07 (×12): qty 1

## 2013-11-07 NOTE — Progress Notes (Signed)
Physical Therapy Session Note  Patient Details  Name: Elaine [name] MRN: [id number] Date of Birth: Dec 01, 1940  Today's Date: 11/07/2013 Time: 1000-1100 and 1300-1345 Time Calculation (min): 60 min and 45 min  Short Term Goals: Week 1:  PT Short Term Goal 1 (Week 1): Patient will perform bed mobility with supervision. PT Short Term Goal 2 (Week 1): Patient will perform functional transfers with minA while maintaining sternal precautions. PT Short Term Goal 3 (Week 1): Patient will ambulate 53' with RW and supervision. PT Short Term Goal 4 (Week 1): Patient will negotiate 4 stairs with one handrail and supervision. PT Short Term Goal 5 (Week 1): Patient will propel wheelchair 100' with B LEs and superivison.  Skilled Therapeutic Interventions/Progress Updates:    AM Session: Patient received sitting in recliner. Session focused on increasing activity tolerance with functional mobility. Patient able to perform sit<>stand transfers without the use of UEs and min guard progressing to close supervision. Gait training in controlled environment 66' x1, 55' x1, and 76' x1 with RW and supervision. Vitals s/p each bout of ambulation: SpO2: 97%, HR: 103 after first ambulation on 2L via [place]; SpO2: 96%, HR: 109 after second ambulation with O2 decreased to 1L via [place]; SpO2: 91%, HR:111 after third ambulation on 1L via [place]. Patient negotiated 3 stairs with B UE, emphasis on maintaining sternal precautions and not pulling self up stairs, with min guard and step to pattern. S/p stair negotiation: SpO2: 93%, HR: 106 on 1L via [place]. Patient returned to room and stating she needs to use bathroom, ambulated to bathroom, approx 12' with RW and supervision, able to manage underwear without assistance. Patient left sitting on toilet, RN tech aware and patient verbalized she will use call bell when done.  PM Session: Patient received sitting in recliner, asleep, but easily awakened for therapy. Session focused on functional  transfers, ambulation in home environment of patient room, and increasing activity tolerance with NuStep. All sit<>stand transfers with min guard and minimal use of B UEs (more for stabilization for balance due to momentum required to stand without pushing up through UEs). Functional ambulation 16'x2 in room, requiring obstacle negotiation in confined spaces, with RW and supervision (one bout on 1L O2 via [place], one bout without O2, SpO2 >96%). NuStep Level 2 with B LEs only x8', first 2 minutes on 1L O2, then no O2 and SpO2 remains above 96% on room air. Patient returned to room and left seated in recliner with all needs within reach and on room air secondary to patient tolerating room air only and SpO2 >96% for entirety of time on room air. Patient educated if she feels SOB or that she needs O2 to put  on (left at 1L) and to call RN. RN aware of patient on room air.  Therapy Documentation Precautions:  Precautions Precautions: Sternal;Fall Precaution Comments: Rt UE PICC line, Sternal precautions. NO UE AROM > 90 degrees Restrictions Weight Bearing Restrictions: No Other Position/Activity Restrictions: pt able to state no pushing with UEs and not to raise UEs above 90 degrees Vital Signs: Therapy Vitals Pulse Rate: 111 Patient Position, if appropriate: Sitting Oxygen Therapy SpO2: 91 % O2 Device: Nasal cannula O2 Flow Rate (L/min): 1 L/min Pulse Oximetry Type: Intermittent Pain: Pain Assessment Pain Assessment: 0-10 Pain Score: 3  Pain Type: Surgical pain Pain Location: Leg Pain Orientation: Right Pain Descriptors / Indicators: Sore Pain Onset: With Activity Pain Intervention(s): Ambulation/increased activity;Repositioned Multiple Pain Sites: No Locomotion : Ambulation Ambulation/Gait Assistance: 5: Supervision  Wheelchair Mobility Distance: 150   See FIM for current functional status  Therapy/Group: Individual Therapy  Lillia Abed. Dvon Jiles, PT, DPT  11/07/2013,  11:53 AM

## 2013-11-07 NOTE — Progress Notes (Signed)
Physical Therapy Session Note  Patient Details  Name: Elaine [name] MRN: [id number] Date of Birth: 10-21-1940  Today's Date: 11/07/2013 Time: [id number] Time Calculation (min): 20 min  Skilled Therapeutic Interventions/Progress Updates:  1:1. Pt received sitting in recliner, ready for therapy. Focus this session on bed mobility w/out use of hospital bed functions for assistance. With increased time pt able to gain momentum to t/f sit<>stand and SPT recliner<>bed w/ use of RW and min A. Pt w/ only light pressure through B UE. Pt req mod A for management of B LE into bed and management of trunk out of bed w/ good ability to problem solving. Pt requesting about potential benefit of leg lifter, with simulation demonstrated how use of leg lifter would cause pt to break sternal precautions. Pt verbalized understanding. Pt sitting in recliner at end of session w/ all needs in reach.   Therapy Documentation Precautions:  Precautions Precautions: Sternal;Fall Precaution Comments: Rt UE PICC line, Sternal precautions. NO UE AROM > 90 degrees Restrictions Weight Bearing Restrictions: No Other Position/Activity Restrictions: pt able to state no pushing with UEs and not to raise UEs above 90 degrees  See FIM for current functional status  Therapy/Group: Individual Therapy  Gilmore Laroche 11/07/2013, 12:12 PM

## 2013-11-07 NOTE — Progress Notes (Signed)
ANTICOAGULATION CONSULT NOTE - Initial Consult  Pharmacy Consult for warfarin  Indication: atrial fibrillation  Allergies  Allergen Reactions  . Tape Rash    Adhesive tape    Patient Measurements: Weight: 264 lb 9.9 oz (120.03 kg)  Vital Signs: Temp: 98.2 F (36.8 C) (03/13 0624) Temp src: Oral (03/13 0624) BP: 121/84 mmHg (03/13 0624) Pulse Rate: 63 (03/13 0624)  Labs:  Recent Labs  11/05/13 0500 11/06/13 0520 11/07/13 0530  HGB  --  9.2*  --   HCT  --  27.1*  --   PLT  --  422*  --   LABPROT 23.4* 25.1* 25.7*  INR 2.16* 2.37* 2.44*  CREATININE  --  1.77* 1.78*    The CrCl is unknown because both a height and weight (above a minimum accepted value) are required for this calculation.  Assessment: 73 year old woman transferred to rehab unit after CABG surgery on 2/26 to continue on warfarin for AFib.  INR is therapeutic at 2.44. She has been receiving 2.5mg  daily in the past 5 days. Pt. Has been on amiodarone with dose reduction on 3/9. No new labs, no bleeding noted per chart.   Goal of Therapy:  INR 2-3    Plan:  Continue Warfarin 2.5mg  daily. Daily INR.  Maryanna Shape, PharmD, BCPS  Clinical Pharmacist  Pager: [phone]   11/07/2013,8:52 AM

## 2013-11-07 NOTE — Progress Notes (Signed)
Occupational Therapy Note   Patient Details  Name: KAITLAIN WESSELMANN MRN: [id number] Date of Birth: 12/13/1940 Today's Date: 11/07/2013  Time:  0900-1000  (60 min) Pain:none Individual session   Engaged in bathing and dressing at sink.  Did sit to stand x 4 with minimal assist and minimal cues for sternal precautions when going to stand.  Pt. Took oxygen off for few minutes during bath, but sats dropped to 72 so put back on 2 liters and O2 went to 98 %.  Pt used reacher to donn pants with set up.  Went over pursed lip breathing for ECT.    Left pt in wc with call bell ready for next therapy.    Lisa Roca 11/07/2013, 11:04 AM

## 2013-11-07 NOTE — Progress Notes (Signed)
Subjective/Complaints: Had a good night. No cough or sob. Denies pain.  A 12 point review of systems has been performed and if not noted above is otherwise negative.   Objective: Vital Signs: Blood pressure 121/84, pulse 63, temperature 98.2 F (36.8 C), temperature source Oral, resp. rate 18, weight 120.03 kg (264 lb 9.9 oz), SpO2 96.00%. Dg Chest 2 View  11/05/2013   CLINICAL DATA CABG.  Pleural effusion.  EXAM CHEST  2 VIEW  COMPARISON DG CHEST 2 VIEW dated 10/31/2013  FINDINGS PICC line in stable position. Prior CABG. Stable cardiomegaly with normal pulmonary vascularity. Low lung volumes. There is persistent left-sided pleural effusion. Underlying left lower lobe infiltrate and/or atelectasis is most likely present. New small right pleural effusion noted. No pneumothorax. No acute osseous abnormality.  IMPRESSION 1. Persistent left-sided pleural effusion with underlying atelectasis and/or infiltrate. 2. New small right pleural effusion. Mild atelectasis right lung base. 3. Stable cardiomegaly. Prior CABG. Pulmonary vascularity is normal. 4. PICC line in stable position.  SIGNATURE  Electronically Signed   By: Marcello Moores  Register   On: 11/05/2013 08:40    Recent Labs  11/06/13 0520  WBC 8.3  HGB 9.2*  HCT 27.1*  PLT 422*    Recent Labs  11/06/13 0520 11/07/13 0530  NA 138 140  K 4.6 4.2  CL 98 98  GLUCOSE 112* 122*  BUN 41* 44*  CREATININE 1.77* 1.78*  CALCIUM 9.0 8.9   CBG (last 3)   Recent Labs  11/06/13 1707 11/06/13 2048 11/07/13 0725  GLUCAP 94 225* 127*    Wt Readings from Last 3 Encounters:  11/07/13 120.03 kg (264 lb 9.9 oz)  11/05/13 120 kg (264 lb 8.8 oz)  11/05/13 120 kg (264 lb 8.8 oz)    Physical Exam:  Constitutional: She is oriented to person, place, and time. No distress.  obese  HENT: only a few remaining teeth. Oral mucosa pink and moist  Head: Normocephalic and atraumatic.  Eyes: EOM are normal.  Right eye with cataracts  Neck:  Normal range of motion. Neck supple. No JVD present. No tracheal deviation present. No thyromegaly present.  Cardiovascular: Normal rate.  Cardiac rate controlled. No murmurs, rubs, or gallops  Respiratory:  Decreased breath sounds at the bases but clear to auscultation. No wheezes, rales, or rhonchi  GI: Soft. Bowel sounds are normal. She exhibits no distension.  Obese  Neurological: She is alert and oriented to person, place, and time.  UE 4/5 prox to distally bilaterally .LLE 3 /5 HF, RLE 1+ HF (pain), BLLE: 3- to 3KE and 4+/5 ADF/APF bilaterally.  Skin:  midline chest incision clean and dry. Leg incision intact/tender along right inner thigh  Psychiatric: She has a normal mood and affect. Her behavior is normal. Judgment and thought content normal   Assessment/Plan: 1. Functional deficits secondary to deconditioning related to multiple medical which require 3+ hours per day of interdisciplinary therapy in a comprehensive inpatient rehab setting. Physiatrist is providing close team supervision and 24 hour management of active medical problems listed below. Physiatrist and rehab team continue to assess barriers to discharge/monitor patient progress toward functional and medical goals. FIM: FIM - Bathing Bathing Steps Patient Completed: Chest;Right Arm;Left Arm;Abdomen;Front perineal area;Right upper leg;Left upper leg Bathing: 2: Max-Patient completes 3-4 79f 10 parts or 25-49%     FIM - Toileting Toileting: 0: Activity did not occur     FIM - Control and instrumentation engineer Devices: Arm rests  Bed/Chair Transfer: 3: Bed > Chair or W/C: Mod A (lift or lower assist);3: Chair or W/C > Bed: Mod A (lift or lower assist)  FIM - Locomotion: Wheelchair Distance: 150 Locomotion: Wheelchair: 0: Activity did not occur (secondary to sternal precautions) FIM - Locomotion: Ambulation Locomotion: Ambulation Assistive Devices: Administrator Ambulation/Gait Assistance: 4:  Min assist Locomotion: Ambulation: 1: Travels less than 50 ft with minimal assistance (Pt.>75%)  Comprehension Comprehension Mode: Auditory Comprehension: 5-Follows basic conversation/direction: With no assist  Expression Expression Mode: Verbal Expression: 5-Expresses complex 90% of the time/cues < 10% of the time  Social Interaction Social Interaction: 7-Interacts appropriately with others - No medications needed.  Problem Solving Problem Solving: 5-Solves basic problems: With no assist  Memory Memory: 6-More than reasonable amt of time  Medical Problem List and Plan:  1. Deconditioning after STEMI/CABG 10/23/2013. Sternal precautions  2. DVT Prophylaxis/Anticoagulation: Coumadin per pharmacy. Latest INR 2.11 Monitor platelet counts and any signs of bleeding  3. Pain Management: Oxycodone as needed. Monitor with increased mobility  4. Neuropsych: This patient is capable of making decisions on her own behalf.  5. Hypertension/atrial fibrillation. Continue amiodarone as directed, Coreg 6.25 mg twice a day, lisinopril 2.5 mg daily. Rate control. No chest pain or shortness of breath  6. Acute on chronic anemia. Latest hemoglobin 9.8. Continue iron supplement  7. Acute pulmonary edema with congestive heart failure.    -bun/cr slightly increased from yesterday.   -weight 2kg down today  -decrease lasix to 40mg  bid 8. Hypothyroidism. Synthroid  9. Type 2 diabetes mellitus. Hemoglobin A1c 5.7. Blood sugars 111-243. Continue sliding scale insulin.   Patient on Glucophage 1000 mg twice a day prior to admission not resumed secondary to elevated creatinine 1.68. Will resume as needed and as possible  -reasonable to fair control at present 10. Hyperlipidemia. Lipitor  LOS (Days) 2 A FACE TO FACE EVALUATION WAS PERFORMED  SWARTZ,ZACHARY T 11/07/2013 8:26 AM

## 2013-11-08 DIAGNOSIS — E119 Type 2 diabetes mellitus without complications: Secondary | ICD-10-CM

## 2013-11-08 DIAGNOSIS — R5381 Other malaise: Secondary | ICD-10-CM

## 2013-11-08 DIAGNOSIS — I2510 Atherosclerotic heart disease of native coronary artery without angina pectoris: Secondary | ICD-10-CM

## 2013-11-08 DIAGNOSIS — I10 Essential (primary) hypertension: Secondary | ICD-10-CM

## 2013-11-08 DIAGNOSIS — E669 Obesity, unspecified: Secondary | ICD-10-CM

## 2013-11-08 LAB — BASIC METABOLIC PANEL
BUN: 48 mg/dL — ABNORMAL HIGH (ref 6–23)
CHLORIDE: 99 meq/L (ref 96–112)
CO2: 28 meq/L (ref 19–32)
Calcium: 8.9 mg/dL (ref 8.4–10.5)
Creatinine, Ser: 1.96 mg/dL — ABNORMAL HIGH (ref 0.50–1.10)
GFR calc Af Amer: 28 mL/min — ABNORMAL LOW (ref >90)
GFR calc non Af Amer: 24 mL/min — ABNORMAL LOW (ref >90)
Glucose, Bld: 132 mg/dL — ABNORMAL HIGH (ref 70–99)
POTASSIUM: 4.6 meq/L (ref 3.7–5.3)
Sodium: 138 mEq/L (ref 137–147)

## 2013-11-08 LAB — GLUCOSE, CAPILLARY
GLUCOSE-CAPILLARY: 131 mg/dL — AB (ref 70–99)
GLUCOSE-CAPILLARY: 113 mg/dL — AB (ref 70–99)
Glucose-Capillary: 204 mg/dL — ABNORMAL HIGH (ref 70–99)
Glucose-Capillary: 113 mg/dL — ABNORMAL HIGH (ref 70–99)

## 2013-11-08 LAB — PROTIME-INR
INR: 2.59 — ABNORMAL HIGH (ref 0.00–1.49)
Prothrombin Time: 26.9 seconds — ABNORMAL HIGH (ref 11.6–15.2)

## 2013-11-08 NOTE — Progress Notes (Signed)
ANTICOAGULATION CONSULT NOTE - Follow Up Consult  Pharmacy Consult for Coumadin Indication: atrial fibrillation  Allergies  Allergen Reactions  . Tape Rash    Adhesive tape    Patient Measurements: Weight: 262 lb 2 oz (118.9 kg) Heparin Dosing Weight:   Vital Signs: Temp: 98.3 F (36.8 C) (03/14 0500) Temp src: Oral (03/14 0500) BP: 111/55 mmHg (03/14 0500) Pulse Rate: 64 (03/14 0500)  Labs:  Recent Labs  11/06/13 0520 11/07/13 0530 11/08/13 0449  HGB 9.2*  --   --   HCT 27.1*  --   --   PLT 422*  --   --   LABPROT 25.1* 25.7* 26.9*  INR 2.37* 2.44* 2.59*  CREATININE 1.77* 1.78* 1.96*    The CrCl is unknown because both a height and weight (above a minimum accepted value) are required for this calculation.   Assessment: CC: 85 YOF admitted 2/23 with STEMI, s/p CABG on 2/26, had post-op afib, orthostasis, and deconditioning, transferred to CIR on 3/11.  PMH: DM, HTN, HLD, hypothyroid, CKD, OA, ICM, CHF, LBBB, afib, neuropathy  AC: Warfarin for afib -started after surgery, INR is therapeutic at 2.59 on 2.5mg  daily x 5 days. Had dose reduction for amio on 3/9.  CV: Hx HTN, HLD, CHF, LBBB, afib - s/p CABG - VSS- amio, asa81, lipitor, Coreg, lasix, lisinopril, metoprolol, K+.  EF 20-25%  Endo: Hx hypothyroid (TSH WNL), DM (A1c 5.7) - synthroid, SSI (PTA metformin, exenatide). CBGs well controlled.  GI/Nutr: Dysphagia diet - LFTs elevated > now down to nl, PPI  Neuro: Hx neuropathy   Renal: Hx CKD - Scr up to 1.96, lytes ok, lasix dose decreased. Watch on ACEI  Heme/Onc: Hg 9.2, Plt 422, PO iron  Best Practices: warfarin, PPI   Goal of Therapy:  INR 2-3 Monitor platelets by anticoagulation protocol: Yes   Plan:  Coumadin 2.5mg  daily If remains in goal range, will change INR to TIW   Oracio Galen S. Alford Highland, PharmD, BCPS Clinical Staff Pharmacist Pager [place], [place] 11/08/2013,8:02 AM

## 2013-11-08 NOTE — IPOC Note (Signed)
Overall Plan of Care [facility]) Patient Details Name: Elaine [name] MRN: [id number] DOB: August 12, 1941  Admitting Diagnosis: Deconditioned  Hospital Problems: Active Problems:   Physical deconditioning     Functional Problem List: Nursing Bladder;Bowel;Edema;Medication Management;Pain;Skin Integrity  PT Balance;Edema;Endurance;Motor;Pain;Safety;Skin Integrity  OT Balance;Edema;Endurance;Pain;Safety;Skin Integrity  SLP    TR         Basic ADL's: OT Eating;Grooming;Bathing;Dressing;Toileting     Advanced  ADL's: OT Simple Meal Preparation;Laundry;Light Housekeeping     Transfers: PT Bed Mobility;Bed to Chair;Car;Furniture  OT Toilet;Tub/Shower     Locomotion: PT Ambulation;Wheelchair Mobility;Stairs     Additional Impairments: OT None  SLP        TR      Anticipated Outcomes Item Anticipated Outcome  Self Feeding mod I  Swallowing      Basic self-care  Supervision  Toileting  supervision   Bathroom Transfers supervision  Bowel/Bladder  min assist  Transfers  mod I  Locomotion  mod I household, supervision community; minA stairs  Communication     Cognition     Pain  less than 3 out of 10  Safety/Judgment  min assist   Therapy Plan: PT Intensity: Minimum of 1-2 x/day ,45 to 90 minutes PT Frequency: 5 out of 7 days PT Duration Estimated Length of Stay: 12-14 days OT Intensity: Minimum of 1-2 x/day, 45 to 90 minutes OT Frequency: 5 out of 7 days OT Duration/Estimated Length of Stay: 12-14 days         Team Interventions: Nursing Interventions Patient/Family Education;Bladder Management;Bowel Management;Pain Management;Skin Care/Wound Management  PT interventions Ambulation/gait training;Balance/vestibular training;Community reintegration;Discharge planning;Neuromuscular re-education;Functional mobility training;DME/adaptive equipment instruction;Disease management/prevention;Pain management;Patient/family education;Psychosocial support;Skin care/wound  management;Splinting/orthotics;UE/LE Coordination activities;UE/LE Strength taining/ROM;Therapeutic Exercise;Therapeutic Activities;Wheelchair propulsion/positioning;Stair training  OT Interventions Publishing copy;Functional mobility training;Pain management;Patient/family education;UE/LE Coordination activities;UE/LE Strength taining/ROM;Therapeutic Exercise;Therapeutic Activities;Self Care/advanced ADL retraining;Community reintegration  SLP Interventions    TR Interventions    SW/CM Interventions Discharge Planning;Psychosocial Support;Patient/Family Education    Team Discharge Planning: Destination: PT-Home ,OT- Home , SLP-  Projected Follow-up: PT-Home health PT, OT-  None, SLP-  Projected Equipment Needs: PT-Rolling walker with 5" wheels;Wheelchair (measurements);Wheelchair cushion (measurements);To be determined, OT- 3 in 1 bedside comode;To be determined, SLP-  Equipment Details: PT-Patient owns rollator; Further recommendations TBD upon discharge., OT-  Patient/family involved in discharge planning: PT- Patient;Family member/caregiver,  OT-Patient;Family member/caregiver, SLP-   MD ELOS: 12-14 days Medical Rehab Prognosis:  Excellent Assessment: The patient has been admitted for CIR therapies. The team will be addressing, functional mobility, strength, stamina, balance, safety, adaptive techniques/equipment, self-care, bowel and bladder mgt, patient and caregiver education, pain mgt, CV tolerance. Goals have been set at supervision to mod I with basic mobility. Min assist to supervision with self-care.    Meredith Staggers, MD, FAAPMR      See Team Conference Notes for weekly updates to the plan of care

## 2013-11-08 NOTE — Progress Notes (Signed)
         Subjective/Complaints: No specific complaints. She feels well but is sore from therapy.   Objective: Vital Signs: Blood pressure 121/62, pulse 71, temperature 98.3 F (36.8 C), temperature source Oral, resp. rate 18, weight 262 lb 2 oz (118.9 kg), SpO2 97.00%. Physical Exam:   No acute distress. Chest clear to auscultation. Cardiac exam S1-S2 are regular. Abdominal exam active bowel sounds, soft her extremities no edema. Assessment/Plan: 1. Functional deficits secondary to deconditioning related to multiple medical  Medical Problem List and Plan:  1. Deconditioning after STEMI/CABG 10/23/2013. Sternal precautions  2. DVT Prophylaxis/Anticoagulation: Coumadin per pharmacy. Latest INR 2.11 Monitor platelet counts and any signs of bleeding  3. Pain Management: Oxycodone as needed. Monitor with increased mobility  4. Neuropsych: This patient is capable of making decisions on her own behalf.  5. Hypertension/atrial fibrillation. Continue amiodarone as directed, Coreg 6.25 mg twice a day, lisinopril 2.5 mg daily. Rate control. No chest pain or shortness of breath  6. Acute on chronic anemia. Latest hemoglobin 9.8. Continue iron supplement  7. Acute pulmonary edema with congestive heart failure.    -Clinically improved. 8. Hypothyroidism. Synthroid  9. Type 2 diabetes mellitus. Hemoglobin A1c 5.7. Blood sugars 111-243. Continue sliding scale insulin.   Patient on Glucophage 1000 mg twice a day prior to admission not resumed secondary to elevated creatinine 1.68. Will resume as needed and as possible 10. Atrial fibrillation is on her problem list. Pharmacy is seeing her for continued anticoagulation for atrial fibrillation. On examination she appears to be in sinus rhythm. Recent EKG is also document sinus rhythm. CBG (last 3)   Recent Labs  11/07/13 1725 11/07/13 2056 11/08/13 0729  GLUCAP 151* 212* 131*     10. Hyperlipidemia. Lipitor  LOS (Days) 3 A FACE TO FACE  EVALUATION WAS PERFORMED  Laranda Burkemper HENRY 11/08/2013 11:54 AM

## 2013-11-09 ENCOUNTER — Inpatient Hospital Stay (HOSPITAL_COMMUNITY): Payer: Medicare HMO | Admitting: Physical Therapy

## 2013-11-09 ENCOUNTER — Inpatient Hospital Stay (HOSPITAL_COMMUNITY): Payer: Medicare HMO | Admitting: Occupational Therapy

## 2013-11-09 LAB — GLUCOSE, CAPILLARY
GLUCOSE-CAPILLARY: 116 mg/dL — AB (ref 70–99)
GLUCOSE-CAPILLARY: 171 mg/dL — AB (ref 70–99)
Glucose-Capillary: 134 mg/dL — ABNORMAL HIGH (ref 70–99)
Glucose-Capillary: 123 mg/dL — ABNORMAL HIGH (ref 70–99)

## 2013-11-09 LAB — PROTIME-INR
INR: 2.55 — AB (ref 0.00–1.49)
Prothrombin Time: 26.6 seconds — ABNORMAL HIGH (ref 11.6–15.2)

## 2013-11-09 MED ORDER — SODIUM CHLORIDE 0.9 % IJ SOLN
10.0000 mL | INTRAMUSCULAR | Status: DC | PRN
  Administered 2013-11-09 – 2013-11-11 (×5): 10 mL

## 2013-11-09 MED ORDER — LOPERAMIDE HCL 2 MG PO CAPS
2.0000 mg | ORAL_CAPSULE | Freq: Four times a day (QID) | ORAL | Status: DC | PRN
  Administered 2013-11-09: 2 mg via ORAL
  Filled 2013-11-09: qty 1

## 2013-11-09 NOTE — Progress Notes (Signed)
ANTICOAGULATION CONSULT NOTE - Follow Up Consult  Pharmacy Consult for Coumadin Indication: atrial fibrillation  Allergies  Allergen Reactions  . Tape Rash    Adhesive tape    Patient Measurements: Weight: 262 lb 12.6 oz (119.2 kg) Heparin Dosing Weight:   Vital Signs: Temp: 98.7 F (37.1 C) (03/15 0545) Temp src: Oral (03/15 0545) BP: 123/68 mmHg (03/15 0545) Pulse Rate: 63 (03/15 0545)  Labs:  Recent Labs  11/07/13 0530 11/08/13 0449 11/09/13 0516  LABPROT 25.7* 26.9* 26.6*  INR 2.44* 2.59* 2.55*  CREATININE 1.78* 1.96*  --     The CrCl is unknown because both a height and weight (above a minimum accepted value) are required for this calculation.   Assessment: CC: 32 YOF admitted 2/23 with STEMI, s/p CABG on 2/26, had post-op afib, orthostasis, and deconditioning, transferred to CIR on 3/11.  PMH: DM, HTN, HLD, hypothyroid, CKD, OA, ICM, CHF, LBBB, afib, neuropathy  AC: Warfarin for afib -started after surgery, INR is therapeutic at 2.55 on 2.5mg  daily for days. Had dose reduction for amio on 3/9.  CV: Hx HTN, HLD, CHF, LBBB, afib - s/p CABG - VSS- amio, asa81, lipitor, Coreg, lasix, lisinopril, metoprolol, K+.  EF 20-25%  Endo: Hx hypothyroid (TSH WNL), DM (A1c 5.7) - synthroid, SSI (PTA metformin, exenatide). CBGs well controlled. 113-131 (had one value 204)  GI/Nutr: Dysphagia diet - LFTs elevated > now down to nl, PPI  Neuro: Hx neuropathy   Renal: Hx CKD - Scr up to 1.96, lytes ok, lasix dose decreased. Watch on ACEI!  Heme/Onc: Hg 9.2, Plt 422, PO iron  Best Practices: warfarin, PPI   Goal of Therapy:  INR 2-3 Monitor platelets by anticoagulation protocol: Yes   Plan:  Coumadin 2.5mg  daily Change INR to [employer] S. Alford Highland, PharmD, BCPS Clinical Staff Pharmacist Pager [phone]  Elaine [name] 11/09/2013,9:40 AM

## 2013-11-09 NOTE — Progress Notes (Signed)
         Subjective/Complaints: No complaints this morning. She does admit to having some nightmares last night.  Objective: Vital Signs: Blood pressure 123/68, pulse 63, temperature 98.7 F (37.1 C), temperature source Oral, resp. rate 16, weight 262 lb 12.6 oz (119.2 kg), SpO2 97.00%. Physical Exam:   Pleasant fem in no acute distress. Chest is clear to auscultation. Cardiac exam S1-S2 are regular. Abdominal exam active bowel sounds, soft. Extremities no edema.  Assessment/Plan: 1. Functional deficits secondary to deconditioning related to multiple medical  Medical Problem List and Plan:  1. Deconditioning after STEMI/CABG 10/23/2013. Sternal precautions  2. DVT Prophylaxis/Anticoagulation: Coumadin per pharmacy. Latest INR 2.11 Monitor platelet counts and any signs of bleeding  3. Pain Management: Oxycodone as needed. Monitor with increased mobility  4. Neuropsych: This patient is capable of making decisions on her own behalf.  5. Hypertension/atrial fibrillation. Continue amiodarone as directed,   Blood pressures range 108-123/65-60. No need to change medications at this time. 6. Acute on chronic anemia. Latest hemoglobin 9.8. Continue iron supplement  7. Acute pulmonary edema with congestive heart failure.    -Clinically improved. 8. Hypothyroidism. Synthroid  9. Type 2 diabetes mellitus. Hemoglobin A1c 5.7. Blood sugars 111-243. Continue sliding scale insulin.   Patient on Glucophage 1000 mg twice a day prior to admission not resumed secondary to elevated creatinine 1.68. Will resume as needed and as possible CBG (last 3)   Recent Labs  11/08/13 1607 11/08/13 2128 11/09/13 0707  GLUCAP 204* 113* 116*     10. Atrial fibrillation is on her problem list. Pharmacy is seeing her for continued anticoagulation for atrial fibrillation. On examination she appears to be in sinus rhythm. Recent EKG is also document sinus rhythm. CBG (last 3)   Recent Labs  11/08/13 1607  11/08/13 2128 11/09/13 0707  GLUCAP 204* 113* 116*   will monitor for now. 10. Hyperlipidemia. Lipitor  LOS (Days) 4 A FACE TO FACE EVALUATION WAS PERFORMED  Curtiss Mahmood HENRY 11/09/2013 9:09 AM

## 2013-11-09 NOTE — Progress Notes (Signed)
Physical Therapy Note  Patient Details  Name: Elaine [name] MRN: [id number] Date of Birth: 1940-12-22 Today's Date: 11/09/2013  [id number] (55 minutes) individual Pain: 3/10 RT LE, 1/10 chest/premedicated Other: BP sitting 122/75  Pulse 67  Oxygen sats (resting) 95% RA  Precautions: sternal (no AROM > 90 degrees bilateral UEs ) Focus of treatment: Therapeutic exercise focused on bilateral LE strengthening/ activity tolerance; gait training Treatment: Pt up in recliner upon arrival; sit to stand SBA without UE assist; transfers stand/turn RW SBA; Nustep Level 2 X 10 minutes (Oxygen sats >92% RA); alternate stepping to 4 inch step (hip flexor strengthening) X 10 with RW support:; Up/down 4 inch step X 3 with HHA on left (up left LE) min/mod assist; gait- 30 feet RW SBA; returned to room with all needs within reach; son present.   1340-1425 (45 minutes) individual Pain: 2/10 RT LE/ premedicated Focus of treatment: Therapeutic exercise focused on bilateral LE strengthening/ activity tolerance; gait training Treatment: Nustep Level 3 X 10 minutes (pt using 1 L [place] oxygen)  With extended seated rest break; alternate stepping up/down 4 inch step with RW support ; gait - 45 feet RW SBA (Oxygen sats > 92% on 1 L [place] during session); returned to room with all needs within reach.     Khoa Opdahl,JIM  11/09/2013, 9:12 AM

## 2013-11-09 NOTE — Progress Notes (Signed)
Occupational Therapy Session Note  Patient Details  Name: Elaine [name] MRN: [id number] Date of Birth: 11-14-1940  Today's Date: 11/09/2013  AM Session:  [id number]. 60 minutes. Individual Session:  ADL in w/c at sink with focus on endurance and maintaining sternal precautions from sit to stand.  Patient able to maintain the precautions.  Patient took approx four 3 min rest breaks and 02 stayed around the 99% saturation mark on room air.  PM Session: 1430-1500 Time Calculation (min): 30 min Patient expressed need to toilet and completed the transfer by walking with her RW with supervision from recliner to/fr toilet.  She maintained sternal precautions while self cleansing and completing clothing mgmt. As well she completed seated endurance activities to increase safety and independence with self care.    Therapy Documentation Precautions:  Precautions Precautions: Sternal;Fall Precaution Comments: Rt UE PICC line, Sternal precautions. NO UE AROM > 90 degrees Restrictions Weight Bearing Restrictions: No Other Position/Activity Restrictions: pt able to state no pushing with UEs and not to raise UEs above 90 degrees  Pain: denied   See FIM for current functional status  Therapy/Group: Individual Therapy  Alfredia Ferguson [facility] 11/09/2013, 4:42 PM

## 2013-11-10 ENCOUNTER — Inpatient Hospital Stay (HOSPITAL_COMMUNITY): Payer: Medicare HMO | Admitting: *Deleted

## 2013-11-10 LAB — BASIC METABOLIC PANEL
BUN: 39 mg/dL — ABNORMAL HIGH (ref 6–23)
CO2: 26 meq/L (ref 19–32)
Calcium: 9.4 mg/dL (ref 8.4–10.5)
Chloride: 100 mEq/L (ref 96–112)
Creatinine, Ser: 1.66 mg/dL — ABNORMAL HIGH (ref 0.50–1.10)
GFR calc non Af Amer: 30 mL/min — ABNORMAL LOW (ref >90)
GFR, EST AFRICAN AMERICAN: 34 mL/min — AB (ref >90)
Glucose, Bld: 222 mg/dL — ABNORMAL HIGH (ref 70–99)
POTASSIUM: 4.5 meq/L (ref 3.7–5.3)
Sodium: 139 mEq/L (ref 137–147)

## 2013-11-10 LAB — GLUCOSE, CAPILLARY
GLUCOSE-CAPILLARY: 112 mg/dL — AB (ref 70–99)
GLUCOSE-CAPILLARY: 180 mg/dL — AB (ref 70–99)
GLUCOSE-CAPILLARY: 108 mg/dL — AB (ref 70–99)
Glucose-Capillary: 204 mg/dL — ABNORMAL HIGH (ref 70–99)

## 2013-11-10 NOTE — Progress Notes (Signed)
Physical Therapy Session Note  Patient Details  Name: Elaine [name] MRN: [id number] Date of Birth: 1941-04-13  Today's Date: 11/10/2013 Time: [id number] and 1126-1202 Time Calculation (min): 38 min and 36 min  Short Term Goals: Week 1:  PT Short Term Goal 1 (Week 1): Patient will perform bed mobility with supervision. PT Short Term Goal 2 (Week 1): Patient will perform functional transfers with minA while maintaining sternal precautions. PT Short Term Goal 3 (Week 1): Patient will ambulate 51' with RW and supervision. PT Short Term Goal 4 (Week 1): Patient will negotiate 4 stairs with one handrail and supervision. PT Short Term Goal 5 (Week 1): Patient will propel wheelchair 100' with B LEs and superivison.  Skilled Therapeutic Interventions/Progress Updates:    First session: Patient received sitting in recliner after missing 22 minutes toileting, eating breakfast and taking meds. Session focused on increasing activity tolerance with functional mobility. Gait training within room with RW and supervision (to simulate home environment), patient required to side step with RW to negotiate small spaces. Gait training in controlled environment 43' x1 and 30' x1 with RW and supervision. Wheelchair mobility with B LEs to increase LE strength x60' with supervision. Standing balance activity x 4' with patient unfastening/fastening tops to pill bottles to increase fine motor control and increase standing tolerance. Patient completed activity with supervision and reports mild numbness in hands due to carpal tunnel. Patient returned to room and left seated in wheelchair with all needs within reach and son present. Patient SpO2 checked throughout session and remains >94% on room air.  Second session: Patient received sitting in wheelchair. Session focused on wheelchair mobility and stair negotiation. Wheelchair mobility with B LEs to increase LE strength x75' with supervision. Patient negotiated 3 stairs with B  handrails and supervision with step to pattern, ascends/descends forwards. Discussion with patient about STE daughter's home, which is where she will be discharging to. 4 STE without handrails, however, patient reports she will always have "multiple family members with her to help get in the house." Due to set up of STE home, patient negotiated 3 stairs with HHA on each side, each helper providing minA. Discussion with patient about family members participating in family education session to practice assisting patient with stair negotiation. Patient returned to room, ambulated to bathroom with RW and supervision and managed all clothing prior to sitting on toilet. Patient left seated on toilet, nurse tech aware and patient verbalized understanding of using call bell when finished.  Therapy Documentation Precautions:  Precautions Precautions: Sternal;Fall Precaution Comments: Rt UE PICC line, NO UE AROM > 90 degrees Restrictions Weight Bearing Restrictions: No Other Position/Activity Restrictions: pt able to state no pushing with UEs and not to raise UEs above 90 degrees General: Amount of Missed PT Time (min): 22 Minutes Missed Time Reason: Nursing care;Other (comment) (toileting, eating breakfast, taking meds) Vital Signs: Therapy Vitals Pulse Rate: 70 Patient Position, if appropriate: Sitting Oxygen Therapy SpO2: 94 % (s/p activity) O2 Device: None (Room air) Pain: Pain Assessment Pain Assessment: 0-10 Pain Score: 3  Pain Type: Surgical pain Pain Location: Leg Pain Orientation: Right Pain Descriptors / Indicators: Sore Pain Onset: With Activity Pain Intervention(s): RN made aware;Repositioned;Ambulation/increased activity Multiple Pain Sites: No Locomotion : Ambulation Ambulation/Gait Assistance: 5: Supervision Wheelchair Mobility Distance: 60   See FIM for current functional status  Therapy/Group: Individual Therapy  Lillia Abed. Laiana Fratus, PT, DPT 11/10/2013,  10:24 AM

## 2013-11-10 NOTE — Progress Notes (Signed)
Occupational Therapy Session Note  Patient Details  Name: Elaine [name] MRN: [id number] Date of Birth: [birth year]  Today's Date: 11/10/2013 Time: 1000-1100  1st session Time Calculation (min): 60 min  Short Term Goals: Week 1:  OT Short Term Goal 1 (Week 1): pt will complete sit<>stand MIN (A) chair / 3n1 OT Short Term Goal 2 (Week 1): Pt will complete LB bathing min (A) OT Short Term Goal 3 (Week 1): Pt will complete bed mobility MOD (A) OT Short Term Goal 4 (Week 1): Pt will dress LB mod (A) using reacher OT Short Term Goal 5 (Week 1): Pt will demonstrate 2 energy conservation techniques      Skilled Therapeutic Interventions/Progress Updates:     1st session;   Engaged in therapeutic bathing and dressing at sink level.  Pt went from sit to stand with supervision while adhering to sternal precautions. She stood with SBA for 5 minutes during donning pants and peri care.  Left in wc with call bell and phone in reach.      2nd session Time:  1330- 1415  (45 min)  Pain:  none Individual session   Pt taken to ADL apartment in wc.  Ambulated with RW to prepare a boiled egg.  Went over walker safety and sternal precautions while performing simple meal prep.  Pt returned demonstration with no problems.    Therapy Documentation Precautions:  Precautions Precautions: Sternal;Fall Precaution Comments: Rt UE PICC line, NO UE AROM > 90 degrees Restrictions Weight Bearing Restrictions: No Other Position/Activity Restrictions: pt able to state no pushing with UEs and not to raise UEs above 90 degrees ADL ADL Comments: See FIM   See FIM for current functional status  Therapy/Group: Individual Therapy  Lisa Roca 11/10/2013, 11:06 AM

## 2013-11-10 NOTE — Progress Notes (Signed)
Subjective/Complaints: No problems. Getting stronger. Son worries that she is pushing too hard at times with PT.   A 12 point review of systems has been performed and if not noted above is otherwise negative.   Objective: Vital Signs: Blood pressure 103/62, pulse 68, temperature 98.7 F (37.1 C), temperature source Oral, resp. rate 19, weight 118.2 kg (260 lb 9.3 oz), SpO2 94.00%. No results found. No results found for this basename: WBC, HGB, HCT, PLT,  in the last 72 hours  Recent Labs  11/08/13 0449  NA 138  K 4.6  CL 99  GLUCOSE 132*  BUN 48*  CREATININE 1.96*  CALCIUM 8.9   CBG (last 3)   Recent Labs  11/09/13 1636 11/09/13 2040 11/10/13 0734  GLUCAP 134* 123* 112*    Wt Readings from Last 3 Encounters:  11/10/13 118.2 kg (260 lb 9.3 oz)  11/05/13 120 kg (264 lb 8.8 oz)  11/05/13 120 kg (264 lb 8.8 oz)    Physical Exam:  Constitutional: She is oriented to person, place, and time. No distress.  obese  HENT: only a few remaining teeth. Oral mucosa pink and moist  Head: Normocephalic and atraumatic.  Eyes: EOM are normal.  Right eye with cataracts  Neck: Normal range of motion. Neck supple. No JVD present. No tracheal deviation present. No thyromegaly present.  Cardiovascular: Normal rate.  Cardiac rate controlled. No murmurs, rubs, or gallops  Respiratory:  BS clear.  No wheezes, rales, or rhonchi  GI: Soft. Bowel sounds are normal. She exhibits no distension.  Obese  Neurological: She is alert and oriented to person, place, and time.  UE 4/5 prox to distally bilaterally .LLE 3 /5 HF, RLE 1+ HF (pain), BLLE: 3- to 3KE and 4+/5 ADF/APF bilaterally.  Skin:  midline chest incision clean and dry. Leg incision intact/tender along right inner thigh  Psychiatric: She has a normal mood and affect. Her behavior is normal. Judgment and thought content normal   Assessment/Plan: 1. Functional deficits secondary to deconditioning related to multiple  medical which require 3+ hours per day of interdisciplinary therapy in a comprehensive inpatient rehab setting. Physiatrist is providing close team supervision and 24 hour management of active medical problems listed below. Physiatrist and rehab team continue to assess barriers to discharge/monitor patient progress toward functional and medical goals. FIM: FIM - Bathing Bathing Steps Patient Completed: Chest;Right Arm;Left Arm;Abdomen;Front perineal area;Right upper leg;Left upper leg Bathing: 4: Min-Patient completes 8-9 47f 10 parts or 75+ percent  FIM - Upper Body Dressing/Undressing Upper body dressing/undressing steps patient completed: Put head through opening of pull over shirt/dress;Thread/unthread left sleeve of pullover shirt/dress;Pull shirt over trunk Upper body dressing/undressing: 5: Supervision: Safety issues/verbal cues FIM - Lower Body Dressing/Undressing Lower body dressing/undressing steps patient completed: Pull pants up/down Lower body dressing/undressing: 3: Mod-Patient completed 50-74% of tasks  FIM - Toileting Toileting steps completed by patient: Adjust clothing prior to toileting;Performs perineal hygiene;Adjust clothing after toileting Toileting: 5: Supervision: Safety issues/verbal cues  FIM - Air cabin crew Transfers Assistive Devices: Insurance account manager Transfers: 5-To toilet/BSC: Supervision (verbal cues/safety issues);5-From toilet/BSC: Supervision (verbal cues/safety issues)  FIM - Engineer, site Assistive Devices: Arm rests Bed/Chair Transfer: 4: Chair or W/C > Bed: Min A (steadying Pt. > 75%);4: Bed > Chair or W/C: Min A (steadying Pt. > 75%)  FIM - Locomotion: Wheelchair Distance: 150 Locomotion: Wheelchair: 1: Total Assistance/staff pushes wheelchair (Pt<25%) FIM - Locomotion: Ambulation Locomotion: Ambulation Assistive Devices: Environmental consultant -  Rolling Ambulation/Gait Assistance: 5: Supervision Locomotion: Ambulation: 2: Travels  50 - 149 ft with supervision/safety issues  Comprehension Comprehension Mode: Auditory Comprehension: 5-Follows basic conversation/direction: With extra time/assistive device  Expression Expression Mode: Verbal Expression: 5-Expresses complex 90% of the time/cues < 10% of the time  Social Interaction Social Interaction: 7-Interacts appropriately with others - No medications needed.  Problem Solving Problem Solving: 5-Solves basic problems: With no assist  Memory Memory: 6-More than reasonable amt of time  Medical Problem List and Plan:  1. Deconditioning after STEMI/CABG 10/23/2013. Sternal precautions  2. DVT Prophylaxis/Anticoagulation: Coumadin per pharmacy. Latest INR 2.11 Monitor platelet counts and any signs of bleeding  3. Pain Management: Oxycodone as needed. Monitor with increased mobility  4. Neuropsych: This patient is capable of making decisions on her own behalf.  5. Hypertension/atrial fibrillation. Continue amiodarone as directed, Coreg 6.25 mg twice a day, lisinopril 2.5 mg daily. Rate control. No chest pain or shortness of breath  6. Acute on chronic anemia. Latest hemoglobin 9.8. Continue iron supplement  7. Acute pulmonary edema with congestive heart failure.    -bun/cr increased on saturday  -weight 2kg down today  -decreased lasix to 40mg  bid on Friday  -need to recheck today 8. Hypothyroidism. Synthroid  9. Type 2 diabetes mellitus. Hemoglobin A1c 5.7. Blood sugars 111-243. Continue sliding scale insulin.   Patient on Glucophage 1000 mg twice a day prior to admission not resumed secondary to elevated creatinine 1.68. Will resume as needed and as possible  -reasonable to fair control at present 10. Hyperlipidemia. Lipitor  LOS (Days) 5 A FACE TO FACE EVALUATION WAS PERFORMED  SWARTZ,ZACHARY T 11/10/2013 8:47 AM

## 2013-11-11 ENCOUNTER — Encounter (HOSPITAL_COMMUNITY): Payer: Medicare Other | Admitting: Occupational Therapy

## 2013-11-11 ENCOUNTER — Inpatient Hospital Stay (HOSPITAL_COMMUNITY): Payer: Medicare HMO | Admitting: *Deleted

## 2013-11-11 ENCOUNTER — Inpatient Hospital Stay (HOSPITAL_COMMUNITY): Payer: Medicare Other

## 2013-11-11 ENCOUNTER — Ambulatory Visit (HOSPITAL_COMMUNITY): Payer: Medicare Other | Admitting: *Deleted

## 2013-11-11 LAB — BASIC METABOLIC PANEL
BUN: 38 mg/dL — ABNORMAL HIGH (ref 6–23)
CALCIUM: 9.3 mg/dL (ref 8.4–10.5)
CO2: 28 mEq/L (ref 19–32)
CREATININE: 1.71 mg/dL — AB (ref 0.50–1.10)
Chloride: 102 mEq/L (ref 96–112)
GFR calc Af Amer: 33 mL/min — ABNORMAL LOW (ref >90)
GFR calc non Af Amer: 28 mL/min — ABNORMAL LOW (ref >90)
Glucose, Bld: 129 mg/dL — ABNORMAL HIGH (ref 70–99)
Potassium: 4.1 mEq/L (ref 3.7–5.3)
SODIUM: 142 meq/L (ref 137–147)

## 2013-11-11 LAB — GLUCOSE, CAPILLARY
Glucose-Capillary: 135 mg/dL — ABNORMAL HIGH (ref 70–99)
Glucose-Capillary: 135 mg/dL — ABNORMAL HIGH (ref 70–99)
Glucose-Capillary: 83 mg/dL (ref 70–99)
Glucose-Capillary: 113 mg/dL — ABNORMAL HIGH (ref 70–99)

## 2013-11-11 LAB — PROTIME-INR
INR: 2.5 — AB (ref 0.00–1.49)
PROTHROMBIN TIME: 26.2 s — AB (ref 11.6–15.2)

## 2013-11-11 NOTE — Progress Notes (Addendum)
Occupational Therapy Note  Patient Details  Name: [name] MRN: [id number] Date of Birth: 09/22/1940 Today's Date: 11/11/2013 Treatment time: 10:00 am - 10:46 am Total Time: 46 min  Short Term Goals:  Week 1: OT Short Term Goal 1 (Week 1): pt will complete sit<>stand MIN (A) chair / 3n1  OT Short Term Goal 2 (Week 1): Pt will complete LB bathing min (A)  OT Short Term Goal 3 (Week 1): Pt will complete bed mobility MOD (A)  OT Short Term Goal 4 (Week 1): Pt will dress LB mod (A) using reacher  OT Short Term Goal 5 (Week 1): Pt will demonstrate 2 energy conservation techniques   Skilled Therapeutic Interventions/Progress Updates:  Pt seen for ADL retraining session for bathing and dressing at sink level today. Pt is overall sit to stand transfers with supervision while adhering to sternal precautions. She stood with SBA for approximately 5 minutes during donning pants and peri care. Pt used LH a/e for LB bathing/dressing w/ supervision/mod I noted. Pt was educated verbally in energy conservation techniques and taking rest breaks, prioritizing tasks and supervision/assist PRN. Pt sitting in recliner chair at conclusion of treatment session with phone, call bell within reach.  Pain: Pt denies pain. 0/10 noted. Precautions: Sternal, Falls. R UE PICC line; No AROM UE's >90*  See FIM for functional Status  Almyra Deforest 11/11/2013, 10:52 AM

## 2013-11-11 NOTE — Plan of Care (Signed)
Problem: RH Stairs Goal: LTG Patient will ambulate up and down stairs w/assist (PT) LTG: Patient will ambulate up and down # of stairs with assistance (PT)  Goal modified 11/11/13

## 2013-11-11 NOTE — Progress Notes (Signed)
ANTICOAGULATION CONSULT NOTE - Initial Consult  Pharmacy Consult for warfarin  Indication: atrial fibrillation  Allergies  Allergen Reactions  . Tape Rash    Adhesive tape    Patient Measurements: Weight: 256 lb 13.4 oz (116.5 kg)  Vital Signs: Temp: 98.3 F (36.8 C) (03/17 0604) Temp src: Oral (03/17 0604) BP: 137/64 mmHg (03/17 0852) Pulse Rate: 74 (03/17 0852)  Labs:  Recent Labs  11/09/13 0516 11/10/13 1020 11/11/13 0555  LABPROT 26.6*  --  26.2*  INR 2.55*  --  2.50*  CREATININE  --  1.66* 1.71*    The CrCl is unknown because both a height and weight (above a minimum accepted value) are required for this calculation.  Assessment: 73 year old woman transferred to rehab unit after CABG surgery on 2/26 to continue on warfarin for AFib.  INR is therapeutic at 2.5, stable on 2.5 mg daily. No new cbc, No bleeding noted per chart.  Goal of Therapy:  INR 2-3    Plan:  Warfarin 2.5mg  daily. F/u INR on TThSat  Maryanna Shape, PharmD, BCPS  Clinical Pharmacist  Pager: [phone]   11/11/2013,1:50 PM

## 2013-11-11 NOTE — Progress Notes (Signed)
Subjective/Complaints: Had a good day with therapies. Felt she found a good balance between pushing and not overdoing it A 12 point review of systems has been performed and if not noted above is otherwise negative.   Objective: Vital Signs: Blood pressure 139/60, pulse 66, temperature 98.3 F (36.8 C), temperature source Oral, resp. rate 20, weight 116.5 kg (256 lb 13.4 oz), SpO2 98.00%. No results found. No results found for this basename: WBC, HGB, HCT, PLT,  in the last 72 hours  Recent Labs  11/10/13 1020 11/11/13 0555  NA 139 142  K 4.5 4.1  CL 100 102  GLUCOSE 222* 129*  BUN 39* 38*  CREATININE 1.66* 1.71*  CALCIUM 9.4 9.3   CBG (last 3)   Recent Labs  11/10/13 1133 11/10/13 1619 11/10/13 2045  GLUCAP 180* 108* 204*    Wt Readings from Last 3 Encounters:  11/11/13 116.5 kg (256 lb 13.4 oz)  11/05/13 120 kg (264 lb 8.8 oz)  11/05/13 120 kg (264 lb 8.8 oz)    Physical Exam:  Constitutional: She is oriented to person, place, and time. No distress.  obese  HENT: only a few remaining teeth. Oral mucosa pink and moist  Head: Normocephalic and atraumatic.  Eyes: EOM are normal.  Right eye with cataracts  Neck: Normal range of motion. Neck supple. No JVD present. No tracheal deviation present. No thyromegaly present.  Cardiovascular: Normal rate.  Cardiac rate controlled. No murmurs, rubs, or gallops, 1+ LE Edema  Respiratory:  BS clear.  No wheezes, rales, or rhonchi  GI: Soft. Bowel sounds are normal. She exhibits no distension.  Obese  Neurological: She is alert and oriented to person, place, and time.  UE 4/5 prox to distally bilaterally .LLE 3 /5 HF, RLE 1+ HF (pain), BLLE: 3- to 3KE and 4+/5 ADF/APF bilaterally.  Skin:  midline chest incision clean and dry. Leg incision intact/tender along right inner thigh  Psychiatric: She has a normal mood and affect. Her behavior is normal. Judgment and thought content normal   Assessment/Plan: 1.  Functional deficits secondary to deconditioning related to multiple medical which require 3+ hours per day of interdisciplinary therapy in a comprehensive inpatient rehab setting. Physiatrist is providing close team supervision and 24 hour management of active medical problems listed below. Physiatrist and rehab team continue to assess barriers to discharge/monitor patient progress toward functional and medical goals. FIM: FIM - Bathing Bathing Steps Patient Completed: Chest;Right Arm;Left Arm;Abdomen;Front perineal area;Right upper leg;Left upper leg;Buttocks;Right lower leg (including foot);Left lower leg (including foot) Bathing: 5: Set-up assist to: Open items  FIM - Upper Body Dressing/Undressing Upper body dressing/undressing steps patient completed: Put head through opening of pull over shirt/dress;Thread/unthread left sleeve of pullover shirt/dress;Pull shirt over trunk;Thread/unthread right sleeve of pullover shirt/dresss Upper body dressing/undressing: 6: More than reasonable amount of time FIM - Lower Body Dressing/Undressing Lower body dressing/undressing steps patient completed: Pull pants up/down;Thread/unthread right pants leg;Thread/unthread left pants leg;Don/Doff right sock;Don/Doff left sock Lower body dressing/undressing: 5: Set-up assist to: Obtain clothing  FIM - Toileting Toileting steps completed by patient: Adjust clothing prior to toileting;Performs perineal hygiene;Adjust clothing after toileting Toileting Assistive Devices: Grab bar or rail for support;Toilet Aid/prosthesis/orthosis Toileting: 4: Steadying assist  FIM - Radio producer Devices: Recruitment consultant Transfers: 5-To toilet/BSC: Supervision (verbal cues/safety issues);4-To toilet/BSC: Min A (steadying Pt. > 75%);5-From toilet/BSC: Supervision (verbal cues/safety issues)  FIM - Control and instrumentation engineer Devices: Adult nurse  Transfer: 5: Bed >  Chair or W/C: Supervision (verbal cues/safety issues);5: Chair or W/C > Bed: Supervision (verbal cues/safety issues)  FIM - Locomotion: Wheelchair Distance: 75 Locomotion: Wheelchair: 2: Travels 97 - 149 ft with supervision, cueing or coaxing FIM - Locomotion: Ambulation Locomotion: Ambulation Assistive Devices: Administrator Ambulation/Gait Assistance: Not tested (comment) Locomotion: Ambulation: 0: Activity did not occur  Comprehension Comprehension Mode: Auditory Comprehension: 5-Understands complex 90% of the time/Cues < 10% of the time  Expression Expression Mode: Verbal Expression: 5-Expresses complex 90% of the time/cues < 10% of the time  Social Interaction Social Interaction: 7-Interacts appropriately with others - No medications needed.  Problem Solving Problem Solving: 6-Solves complex problems: With extra time  Memory Memory: 6-More than reasonable amt of time  Medical Problem List and Plan:  1. Deconditioning after STEMI/CABG 10/23/2013. Sternal precautions  2. DVT Prophylaxis/Anticoagulation: Coumadin per pharmacy. Latest INR 2.11 Monitor platelet counts and any signs of bleeding  3. Pain Management: Oxycodone as needed. Monitor with increased mobility  4. Neuropsych: This patient is capable of making decisions on her own behalf.  5. Hypertension/atrial fibrillation. Continue amiodarone as directed, Coreg 6.25 mg twice a day, lisinopril 2.5 mg daily. Rate control. No chest pain or shortness of breath  6. Acute on chronic anemia. Latest hemoglobin 9.8. Continue iron supplement  7. Acute pulmonary edema with congestive heart failure.    -bun/cr still elevated but stable  -weight is down 5.5kg since admit but I's and O's 2200+  -continue decreased lasix  40mg  bid    -follow up thursday 8. Hypothyroidism. Synthroid  9. Type 2 diabetes mellitus. Hemoglobin A1c 5.7. Blood sugars 111-243. Continue sliding scale insulin.   Patient on Glucophage 1000 mg twice a day  prior to admission not resumed secondary to elevated creatinine 1.7. Will resume as needed and as possible  -reasonable to fair control at present 10. Hyperlipidemia. Lipitor  LOS (Days) 6 A FACE TO FACE EVALUATION WAS PERFORMED  Perrie Ragin T 11/11/2013 8:15 AM

## 2013-11-11 NOTE — Progress Notes (Signed)
Physical Therapy Session Note  Patient Details  Name: Elaine [name] MRN: [id number] Date of Birth: 1941/07/19  Today's Date: 11/11/2013 Time: 1300-1400 Time Calculation (min): 60 min   Skilled Therapeutic Interventions/Progress Updates:    Patient received sitting in recliner. Patient performed transfer recliner>BSC and able to manage all aspects of toileting with supervision. Patient transported to therapy gym in wheelchair by nurse tech. Patient participated in group therapy session with focus on activity tolerance, functional transfers, gait training, and standing balance. Gait training 36' x2 with RW and supervision, wheelchair mobility with B LEs x65' with supervision, repeated sit<>stands 2 x5 with supervision, horseshoe toss 2 rounds x6 tosses (standing ~3' each round) with supervision. Patient returned to room and left seated in wheelchair with all needs within reach.  Therapy Documentation Precautions:  Precautions Precautions: Sternal;Fall Precaution Comments: Rt UE PICC line, NO UE AROM > 90 degrees Restrictions Weight Bearing Restrictions: No Other Position/Activity Restrictions: pt able to state no pushing with UEs and not to raise UEs above 90 degrees  See FIM for current functional status  Therapy/Group: Group Therapy  Shelton Silvas S Aunica Dauphinee S. Crestina Strike, PT, DPT 11/11/2013, 2:09 PM

## 2013-11-11 NOTE — Progress Notes (Signed)
Physical Therapy Session Note  Patient Details  Name: Elaine [name] MRN: [id number] Date of Birth: [birth year]  Today's Date: 11/11/2013 Time: 0815-0900 Time Calculation (min): 45 min  Short Term Goals: Week 1:  PT Short Term Goal 1 (Week 1): Patient will perform bed mobility with supervision. PT Short Term Goal 2 (Week 1): Patient will perform functional transfers with minA while maintaining sternal precautions. PT Short Term Goal 3 (Week 1): Patient will ambulate 78' with RW and supervision. PT Short Term Goal 4 (Week 1): Patient will negotiate 4 stairs with one handrail and supervision. PT Short Term Goal 5 (Week 1): Patient will propel wheelchair 100' with B LEs and superivison.  Skilled Therapeutic Interventions/Progress Updates:    Patient received sitting in recliner. Session focused on increasing activity tolerance with all functional mobility. Gait training in controlled and home environments 75' x1 (25' in home environment) with RW and supervision. Wheelchair mobility 14' x1 with B LE and supervision. Stair negotiation x3 stairs with B HHA (minA of 2 people) with step to pattern, ascends/descends forwards. Patient returned to room and left seated in wheelchair with all needs within reach. SpO2 >96% on room air entire session with exception of dropping to 93% s/p stair negotiation.  Therapy Documentation Precautions:  Precautions Precautions: Sternal;Fall Precaution Comments: Rt UE PICC line, NO UE AROM > 90 degrees Restrictions Weight Bearing Restrictions: No Other Position/Activity Restrictions: pt able to state no pushing with UEs and not to raise UEs above 90 degrees Vital Signs: Therapy Vitals Pulse Rate: 75 Patient Position, if appropriate: Sitting Oxygen Therapy SpO2: 93 % (s/p ambulation) O2 Device: None (Room air) Pulse Oximetry Type: Intermittent Pain: Pain Assessment Pain Assessment: No/denies pain Pain Score: 0-No pain  See FIM for current functional  status  Therapy/Group: Individual Therapy  Lillia Abed. Marilynne Dupuis, PT, DPT  11/11/2013, 8:59 AM

## 2013-11-11 NOTE — Progress Notes (Addendum)
Physical Therapy Session Note  Patient Details  Name: Elaine [name] MRN: [id number] Date of Birth: 1940/09/10  Today's Date: 11/11/2013 Time: 1530-1600 Time Calculation (min): 30 min   Skilled Therapeutic Interventions/Progress Updates:  1:1. Pt received sitting in w/c, ready for therapy. Focus this session on ambulation in home environment, toileting and t/f sup<>sit in standard bed. Pt able to perform all transfers and amb 20-25'x4 bouts this session on hard level, carpeted and tiled floors w/ use of RW and (S). Pt demonstrating good problem solving for t/f sup<>sit w/ (S) in a standard bed while maintaining sternal precautions. Pt struggling to bring R LE onto bed, but able w/ increased time. Pt req (S) for toileting this session as well. Mild SOB during activity, but decreasing w/ rest and pt initiated pursed lip breathing technique. Pt sitting in recliner at end of session w/ all needs in reach.   Therapy Documentation Precautions:  Precautions Precautions: Sternal;Fall Precaution Comments: Rt UE PICC line, NO UE AROM > 90 degrees Restrictions Weight Bearing Restrictions: No Other Position/Activity Restrictions: pt able to state no pushing with UEs and not to raise UEs above 90 degrees  See FIM for current functional status  Therapy/Group: Individual Therapy  Gilmore Laroche 11/11/2013, 4:05 PM

## 2013-11-11 NOTE — Plan of Care (Signed)
Problem: RH SKIN INTEGRITY Goal: RH STG SKIN FREE OF INFECTION/BREAKDOWN Outcome: Not Progressing Breakdown at intergluteal cleft

## 2013-11-12 ENCOUNTER — Inpatient Hospital Stay (HOSPITAL_COMMUNITY): Payer: Medicare Other

## 2013-11-12 ENCOUNTER — Inpatient Hospital Stay (HOSPITAL_COMMUNITY): Payer: Medicare HMO | Admitting: Physical Therapy

## 2013-11-12 ENCOUNTER — Encounter (HOSPITAL_COMMUNITY): Payer: Medicare Other | Admitting: Occupational Therapy

## 2013-11-12 ENCOUNTER — Inpatient Hospital Stay (HOSPITAL_COMMUNITY): Payer: Medicare HMO | Admitting: *Deleted

## 2013-11-12 DIAGNOSIS — Z951 Presence of aortocoronary bypass graft: Secondary | ICD-10-CM

## 2013-11-12 DIAGNOSIS — I10 Essential (primary) hypertension: Secondary | ICD-10-CM

## 2013-11-12 DIAGNOSIS — I2510 Atherosclerotic heart disease of native coronary artery without angina pectoris: Secondary | ICD-10-CM

## 2013-11-12 DIAGNOSIS — I4891 Unspecified atrial fibrillation: Secondary | ICD-10-CM

## 2013-11-12 DIAGNOSIS — E669 Obesity, unspecified: Secondary | ICD-10-CM

## 2013-11-12 DIAGNOSIS — I501 Left ventricular failure: Secondary | ICD-10-CM

## 2013-11-12 DIAGNOSIS — E119 Type 2 diabetes mellitus without complications: Secondary | ICD-10-CM

## 2013-11-12 DIAGNOSIS — R5381 Other malaise: Secondary | ICD-10-CM

## 2013-11-12 LAB — BASIC METABOLIC PANEL WITH GFR
BUN: 36 mg/dL — ABNORMAL HIGH (ref 6–23)
CO2: 26 meq/L (ref 19–32)
Calcium: 9.3 mg/dL (ref 8.4–10.5)
Chloride: 100 meq/L (ref 96–112)
Creatinine, Ser: 1.69 mg/dL — ABNORMAL HIGH (ref 0.50–1.10)
GFR calc Af Amer: 34 mL/min — ABNORMAL LOW
GFR calc non Af Amer: 29 mL/min — ABNORMAL LOW
Glucose, Bld: 204 mg/dL — ABNORMAL HIGH (ref 70–99)
Potassium: 4.7 meq/L (ref 3.7–5.3)
Sodium: 140 meq/L (ref 137–147)

## 2013-11-12 LAB — GLUCOSE, CAPILLARY
GLUCOSE-CAPILLARY: 117 mg/dL — AB (ref 70–99)
Glucose-Capillary: 108 mg/dL — ABNORMAL HIGH (ref 70–99)
Glucose-Capillary: 139 mg/dL — ABNORMAL HIGH (ref 70–99)
Glucose-Capillary: 174 mg/dL — ABNORMAL HIGH (ref 70–99)

## 2013-11-12 MED ORDER — FUROSEMIDE 40 MG PO TABS
40.0000 mg | ORAL_TABLET | Freq: Two times a day (BID) | ORAL | Status: DC
  Administered 2013-11-12 – 2013-11-13 (×2): 40 mg via ORAL
  Filled 2013-11-12 (×4): qty 1

## 2013-11-12 MED ORDER — SODIUM CHLORIDE 0.9 % IJ SOLN: 10.0000 mL | INTRAMUSCULAR | Status: DC | PRN

## 2013-11-12 MED ORDER — SODIUM CHLORIDE 0.9 % IJ SOLN
10.0000 mL | INTRAMUSCULAR | Status: DC | PRN
Start: 2013-11-12 — End: 2013-11-13
  Administered 2013-11-12 – 2013-11-13 (×2): 10 mL

## 2013-11-12 NOTE — Discharge Summary (Signed)
NAME:  Elaine [name], Elaine [name]                      ACCOUNT NO.:  MEDICAL RECORD NO.:  [id number]  LOCATION:                                 FACILITY:  PHYSICIAN:  Meredith Staggers, M.D.DATE OF BIRTH:  04/22/1941  DATE OF ADMISSION:  10/30/2013 DATE OF DISCHARGE:  11/13/2013                              DISCHARGE SUMMARY   DISCHARGE DIAGNOSES: 1. Deconditioning after STEMI-CABG on October 23, 2013. 2. Coumadin for atrial fibrillation. 3. Pain management. 4. Hypertension. 5. Acute on chronic anemia. 6. Acute pulmonary edema with congestive heart failure. 7. Hypothyroidism. 8. Type 2 diabetes mellitus. 9. Hyperlipidemia.  HISTORY OF PRESENT ILLNESS:  This is a 73 year old right-handed female, with history of hypertension, diabetes mellitus, and former tobacco abuse, was independent prior to admission caring for her husband. Admitted on October 20, 2013, with persistent chest pressure and shortness of breath with lower extremity edema.  Noted to be in atrial fibrillation with a rate in the low 100s.  There were some ST-segment abnormalities concerning for possible STEMI.  Cardiac catheterization demonstrated moderate left main coronary artery stenosis with high-grade proximal stenosis of the left anterior descending coronary arteries three vessel disease.  Underwent CABG x3 as well as clipping of left atrial appendage on October 23, 2013, per Dr. Roxy Manns.  HOSPITAL COURSE:  Orthostasis maintained on dopamine.  Bouts of fluid overload with diuresis.  Acute blood loss anemia 9.8 monitored. Coumadin initiated in relation to atrial fibrillation and bypass surgery.  Physical and occupational therapy ongoing with sternal precautions.  She was admitted for comprehensive rehab program.  PAST MEDICAL HISTORY:  See discharge diagnoses.  SOCIAL HISTORY:  Lives with spouse.  Functional history prior to admission independent with a cane.  Functional status upon admission to rehab services was  ambulating 86 feet limited by tachycardia, moderate assist with basic transfers and ambulation.  PHYSICAL EXAMINATION:  VITAL SIGNS:  Blood pressure 106/48, pulse 76 temperature 98.3, respirations 20. GENERAL:  This was an alert female, oriented x3.  Poor dentition.  Right eye vision limited. LUNGS:  Clear to auscultation. CARDIAC:  Rate controlled. ABDOMEN:  Soft, nontender.  Good bowel sounds. CHEST:  Incision clean and dry.  REHABILITATION HOSPITAL COURSE:  Patient was admitted to inpatient rehab services with therapies initiated on a 3-hour daily basis consisting of physical therapy, occupational therapy, and rehabilitation nursing.  The following issues were addressed during the patient's rehabilitation stay.  Pertaining to Ms. Desanto's deconditioning after CABG, STEMI remained stable with sternal precautions.  Functional mobility continued to improve.  Maintained on Coumadin for atrial fibrillation in relation to CABG, she would follow up Dr. Glenetta Hew [phone] in relation to Coumadin therapy with rate control.  Blood pressures were monitored as she continued on amiodarone as well as Coreg.  Lasix ongoing for some fluid overload.  No increasing shortness of breath.  Hemoglobin and hematocrit remained stable with acute on chronic anemia.  She did have a history of diabetes mellitus with peripheral neuropathy.  Hemoglobin A1c 5.7.  She had been on amiodarone prior to admission.  This had been held due to some mild elevation in creatinine.  She continued on hormone  supplement for hypothyroidism.  The patient received weekly collaborative interdisciplinary team conferences to discuss estimated length of stay, family teaching, and any barriers to discharge.  Patient able to perform all transfers and ambulate 25 feet x4 with rest breaks as needed on carpeted entire floors using a rolling walker demonstrated good problem solving.  Transfers supine to sit with supervision in  a standard bed maintaining her sternal precautions.  Patient overall able to sit to stand with supervision while adhering to sternal precautions. Lower body bathing, dressing with supervision modified independence. Patient encouraged with her overall progress.  Discharged to home after family teaching.  DISCHARGE MEDICATIONS: 1. Amiodarone 200 mg p.o. daily. 2. Aspirin 81 mg p.o. daily. 3. Lipitor 20 mg p.o. daily. 4. Coreg 6.25 mg p.o. b.i.d. 5. Colace 200 mg p.o. daily. 6. Ferrous sulfate 325 mg p.o. daily. 7. Lasix 40 mg p.o. b.i.d. 8. Synthroid 112 mcg p.o. daily. 9. Lisinopril 2.5 mg p.o. daily. 10.Oxycodone immediate release 5-10 mg every 3 hours as needed     moderate pain, dispense of 90 tablets. 11.Protonix 40 mg p.o. daily. 12.Potassium chloride 20 mEq p.o. daily. 13.Prednisone forte ophthalmic solution 1%, 1 drop right eye daily. 14.Coumadin 2.5 mg p.o. daily adjusted accordingly for INR of 2.0-3.0.  FOLLOWUP:  The patient would follow up with Dr. Alger Simons at the outpatient rehab service office as directed; Dr. Glenetta Hew, Cardiology Services, call for appointment in 2 weeks; Dr. Darylene Price, Cardiothoracic Surgery in 2 weeks; and Dr. Marton Redwood, Medical Management.  SPECIAL INSTRUCTIONS:  Arrangements made for patient to follow up Coumadin Clinic with Dr. Glenetta Hew, Cardiology Services.  The patient was on a mechanical soft diet, sternal precautions.     Lauraine Rinne, P.A.   ______________________________ Meredith Staggers, M.D.    DA/MEDQ  D:  11/12/2013  T:  11/12/2013  Job:  [id number]  cc:   Rolland Porter, MD Valentina Gu Roxy Manns, M.D. Dr. Marton Redwood

## 2013-11-12 NOTE — Progress Notes (Signed)
Social Work Patient ID: Elaine [name], female   DOB: 1941/01/24, 73 y.o.   MRN: [id number]  Have reviewed team conf with pt and with daughter, [name].  Both aware and agreeable with targeted d/c date of 3/19 with mod i - min assist goals overall.  Pt to d/c home with daughter where 24/7 assist is available.    Semaja Lymon, LCSW

## 2013-11-12 NOTE — Progress Notes (Signed)
DAILY PROGRESS NOTE  Subjective:  No complaints. Breathing well. In's are recorded, but urine output has not been recorded. She is peeing well. Says she is peeing too much at night. Creatinine is stable. Weight is continuing to come down - is down another 1lb since admission to rehab.  Objective:  Temp:  [98.8 F (37.1 C)-99.1 F (37.3 C)] 98.8 F (37.1 C) (03/18 0449) Pulse Rate:  [65-69] 69 (03/18 0449) Resp:  [18-20] 20 (03/18 0449) BP: (92-155)/(60-71) 155/71 mmHg (03/18 0449) SpO2:  [95 %] 95 % (03/18 0449) Weight:  [255 lb 11.7 oz (116 kg)] 255 lb 11.7 oz (116 kg) (03/18 0449) Weight change: -1 lb 1.6 oz (-0.5 kg)  Intake/Output from previous day: 03/17 0701 - 03/18 0700 In: 600 [P.O.:600] Out: -   Intake/Output from this shift:    Medications: Current Facility-Administered Medications  Medication Dose Route Frequency Provider Last Rate Last Dose  . acetaminophen (TYLENOL) tablet 325-650 mg  325-650 mg Oral Q4H PRN Lavon Paganini Angiulli, PA-C      . amiodarone (PACERONE) tablet 200 mg  200 mg Oral Daily Lavon Paganini Angiulli, PA-C   200 mg at 11/12/13 0732  . aspirin EC tablet 81 mg  81 mg Oral Daily Lavon Paganini Angiulli, PA-C   81 mg at 11/12/13 7253  . atorvastatin (LIPITOR) tablet 20 mg  20 mg Oral q1800 Lavon Paganini Angiulli, PA-C   20 mg at 11/11/13 1802  . bisacodyl (DULCOLAX) EC tablet 10 mg  10 mg Oral Daily Lavon Paganini Angiulli, PA-C   10 mg at 11/11/13 6644   Or  . bisacodyl (DULCOLAX) suppository 10 mg  10 mg Rectal Daily Lavon Paganini Angiulli, PA-C      . carvedilol (COREG) tablet 6.25 mg  6.25 mg Oral BID WC Daniel J Angiulli, PA-C   6.25 mg at 11/12/13 0732  . docusate sodium (COLACE) capsule 200 mg  200 mg Oral Daily Lavon Paganini Angiulli, PA-C   200 mg at 11/12/13 0347  . feeding supplement (RESOURCE BREEZE) (RESOURCE BREEZE) liquid 1 Container  1 Container Oral TID WC Lavon Paganini Angiulli, PA-C   1 Container at 11/12/13 [phone]  . ferrous sulfate tablet 325 mg  325 mg Oral Q  breakfast Lavon Paganini Angiulli, PA-C   325 mg at 11/12/13 0732  . furosemide (LASIX) tablet 40 mg  40 mg Oral BID Meredith Staggers, MD   40 mg at 11/12/13 0732  . guaiFENesin (MUCINEX) 12 hr tablet 600 mg  600 mg Oral BID Lavon Paganini Angiulli, PA-C   600 mg at 11/12/13 0732  . insulin aspart (novoLOG) injection 0-20 Units  0-20 Units Subcutaneous TID WC Lavon Paganini Angiulli, PA-C   3 Units at 11/11/13 1240  . levalbuterol (XOPENEX) nebulizer solution 0.63 mg  0.63 mg Nebulization Q6H PRN Lavon Paganini Angiulli, PA-C      . levothyroxine (SYNTHROID, LEVOTHROID) tablet 112 mcg  112 mcg Oral QAC breakfast Lavon Paganini Angiulli, PA-C   112 mcg at 11/12/13 0630  . lisinopril (PRINIVIL,ZESTRIL) tablet 2.5 mg  2.5 mg Oral Daily Lavon Paganini Angiulli, PA-C   2.5 mg at 11/12/13 0732  . loperamide (IMODIUM) capsule 2 mg  2 mg Oral QID PRN Cassandria Anger, MD   2 mg at 11/09/13 2030  . ondansetron (ZOFRAN) tablet 4 mg  4 mg Oral Q6H PRN Lavon Paganini Angiulli, PA-C       Or  . ondansetron (ZOFRAN) injection 4 mg  4 mg Intravenous Q6H  PRN Lavon Paganini Angiulli, PA-C      . oxyCODONE (Oxy IR/ROXICODONE) immediate release tablet 5-10 mg  5-10 mg Oral Q3H PRN Lavon Paganini Angiulli, PA-C      . pantoprazole (PROTONIX) EC tablet 40 mg  40 mg Oral Daily Lavon Paganini Angiulli, PA-C   40 mg at 11/12/13 0731  . potassium chloride SA (K-DUR,KLOR-CON) CR tablet 20 mEq  20 mEq Oral Daily Lavon Paganini Angiulli, PA-C   20 mEq at 11/12/13 0732  . prednisoLONE acetate (PRED FORTE) 1 % ophthalmic suspension 1 drop  1 drop Right Eye Daily Lavon Paganini Angiulli, PA-C   1 drop at 11/12/13 0731  . sodium chloride 0.9 % injection 10-40 mL  10-40 mL Intracatheter PRN Meredith Staggers, MD   10 mL at 11/11/13 2049  . sodium chloride 0.9 % injection 10-40 mL  10-40 mL Intracatheter PRN Meredith Staggers, MD      . sodium chloride 0.9 % injection 10-40 mL  10-40 mL Intracatheter PRN Meredith Staggers, MD   10 mL at 11/12/13 0952  . sorbitol 70 % solution 30 mL  30 mL Oral Daily  PRN Lavon Paganini Angiulli, PA-C      . warfarin (COUMADIN) tablet 2.5 mg  2.5 mg Oral q1800 Meredith Staggers, MD   2.5 mg at 11/11/13 1802  . Warfarin - Pharmacist Dosing Inpatient   Does not apply q1800 Meredith Staggers, MD        Physical Exam: General appearance: alert and no distress Lungs: clear to auscultation bilaterally Heart: regular rate and rhythm, S1, S2 normal, no murmur, click, rub or gallop Extremities: edema 1+ bilateral pitting edema  Lab Results: Results for orders placed during the hospital encounter of 11/05/13 (from the past 48 hour(s))  GLUCOSE, CAPILLARY     Status: Abnormal   Collection Time    11/10/13 11:33 AM      Result Value Ref Range   Glucose-Capillary 180 (*) 70 - 99 mg/dL   Comment 1 Notify RN    GLUCOSE, CAPILLARY     Status: Abnormal   Collection Time    11/10/13  4:19 PM      Result Value Ref Range   Glucose-Capillary 108 (*) 70 - 99 mg/dL   Comment 1 Notify RN    GLUCOSE, CAPILLARY     Status: Abnormal   Collection Time    11/10/13  8:45 PM      Result Value Ref Range   Glucose-Capillary 204 (*) 70 - 99 mg/dL  BASIC METABOLIC PANEL     Status: Abnormal   Collection Time    11/11/13  5:55 AM      Result Value Ref Range   Sodium 142  137 - 147 mEq/L   Potassium 4.1  3.7 - 5.3 mEq/L   Chloride 102  96 - 112 mEq/L   CO2 28  19 - 32 mEq/L   Glucose, Bld 129 (*) 70 - 99 mg/dL   BUN 38 (*) 6 - 23 mg/dL   Creatinine, Ser 1.71 (*) 0.50 - 1.10 mg/dL   Calcium 9.3  8.4 - 10.5 mg/dL   GFR calc non Af Amer 28 (*) >90 mL/min   GFR calc Af Amer 33 (*) >90 mL/min   Comment: (NOTE)     The eGFR has been calculated using the CKD EPI equation.     This calculation has not been validated in all clinical situations.     eGFR's persistently <90 mL/min signify  possible Chronic Kidney     Disease.  PROTIME-INR     Status: Abnormal   Collection Time    11/11/13  5:55 AM      Result Value Ref Range   Prothrombin Time 26.2 (*) 11.6 - 15.2 seconds   INR 2.50  (*) 0.00 - 1.49  GLUCOSE, CAPILLARY     Status: Abnormal   Collection Time    11/11/13  7:42 AM      Result Value Ref Range   Glucose-Capillary 135 (*) 70 - 99 mg/dL  GLUCOSE, CAPILLARY     Status: Abnormal   Collection Time    11/11/13 11:41 AM      Result Value Ref Range   Glucose-Capillary 135 (*) 70 - 99 mg/dL   Comment 1 Notify RN    GLUCOSE, CAPILLARY     Status: None   Collection Time    11/11/13  4:45 PM      Result Value Ref Range   Glucose-Capillary 83  70 - 99 mg/dL   Comment 1 Notify RN    GLUCOSE, CAPILLARY     Status: Abnormal   Collection Time    11/11/13  9:09 PM      Result Value Ref Range   Glucose-Capillary 113 (*) 70 - 99 mg/dL  GLUCOSE, CAPILLARY     Status: Abnormal   Collection Time    11/12/13  7:31 AM      Result Value Ref Range   Glucose-Capillary 108 (*) 70 - 99 mg/dL  BASIC METABOLIC PANEL     Status: Abnormal   Collection Time    11/12/13  9:45 AM      Result Value Ref Range   Sodium 140  137 - 147 mEq/L   Potassium 4.7  3.7 - 5.3 mEq/L   Chloride 100  96 - 112 mEq/L   CO2 26  19 - 32 mEq/L   Glucose, Bld 204 (*) 70 - 99 mg/dL   BUN 36 (*) 6 - 23 mg/dL   Creatinine, Ser 1.69 (*) 0.50 - 1.10 mg/dL   Calcium 9.3  8.4 - 10.5 mg/dL   GFR calc non Af Amer 29 (*) >90 mL/min   GFR calc Af Amer 34 (*) >90 mL/min   Comment: (NOTE)     The eGFR has been calculated using the CKD EPI equation.     This calculation has not been validated in all clinical situations.     eGFR's persistently <90 mL/min signify possible Chronic Kidney     Disease.    Imaging: No results found.  Assessment:  Active Problems:   Acute pulmonary edema with congestive heart failure   S/P CABG x 3   Physical deconditioning   Plan:  1. She is doing great after surgery. She continues to diurese, although "outs" not recorded. Weight is decreasing. Would keep her on lasix 40 mg BID, but change the pm dose to afternoon so that she can sleep better at night. Follow-up as  scheduled with Cecilie Kicks, FNP at our [facility] office after discharge.   Please call with additional questions.  Time Spent Directly with Patient:  15 minutes  Length of Stay:  LOS: 7 days   Pixie Casino, MD, [facility] Attending Cardiologist [facility]  Saeed Toren C 11/12/2013, 10:54 AM

## 2013-11-12 NOTE — Progress Notes (Addendum)
Physical Therapy Discharge Summary  Patient Details  Name: Elaine [name] MRN: [id number] Date of Birth: 27-Feb-1941  Today's Date: 11/12/2013 Time: 3545-6256 Time Calculation (min): 55 min  Patient has met 12 of 12 long term goals due to improved activity tolerance, improved balance, improved postural control, increased strength, decreased pain, ability to compensate for deficits, improved coordination and improved implementation of precautions.  Patient to discharge at a household ambulatory level Modified Independent, community wheelchair level supervision (due to sternal precautions not allowing use of B UEs). Patient's care partner is independent to provide the necessary physical assistance at discharge. Patient's son has completed family education Patient's son has attended several therapy sessions and has completed hands on education session for negotiation of 4 stairs without handrails and B HHA. Additionally, patient and son educated on negotiation of stairs sideways with B UEs on one handrail when there is a handrail present.  Reasons goals not met: N/A, all LTGs met.  Recommendation:  Patient will benefit from ongoing skilled PT services in home health setting to continue to advance safe functional mobility, address ongoing impairments in activity tolerance, balance, strength, coordination, gait, and minimize fall risk.  Equipment: 20x18 wheelchair with basic cushion; patient owns RW  Reasons for discharge: treatment goals met and discharge from hospital  Patient/family agrees with progress made and goals achieved: Yes  Skilled Interventions: Today's session focused on family education and functional mobility in preparation for discharge home. See AM PT note for details. Patient completed car transfer with RW and supervision provided by son. Patient and son educated on wheelchair folding/unfolding and wheelchair parts management, patient and son return demonstrate. Patient made mod I  in room.  Patient and son verbalized understanding and in agreement with all discharge recommendations and have no further questions at this time.  PT Discharge Precautions/Restrictions Precautions Precautions: Sternal Precaution Comments: Rt UE PICC line, NO UE AROM > 90 degrees Restrictions Weight Bearing Restrictions: No Pain Pain Assessment Pain Assessment: No/denies pain Pain Score: 0-No pain Vision/Perception  Vision - History Baseline Vision: Wears glasses only for reading Visual History: Corrective eye surgery (corneal transplant) Patient Visual Report: No change from baseline Vision - Assessment Eye Alignment: Within Functional Limits Vision Assessment: Vision not tested Perception Perception: Within Functional Limits Praxis Praxis: Intact  Cognition Overall Cognitive Status: Within Functional Limits for tasks assessed Arousal/Alertness: Awake/alert Orientation Level: Oriented X4 Memory: Appears intact Awareness: Appears intact Safety/Judgment: Appears intact Sensation Sensation Light Touch: Appears Intact Proprioception: Appears Intact Coordination Gross Motor Movements are Fluid and Coordinated: Yes Fine Motor Movements are Fluid and Coordinated: Yes Motor  Motor Motor: Within Functional Limits Motor - Discharge Observations: improved strength, but continues with generalized weakness  Mobility Bed Mobility Bed Mobility: Supine to Sit;Sit to Supine Supine to Sit: 6: Modified independent (Device/Increase time);HOB flat Sit to Supine: 6: Modified independent (Device/Increase time);HOB flat Transfers Transfers: Yes Sit to Stand: 6: Modified independent (Device/Increase time);Without upper extremity assist;From bed;From chair/3-in-1 Stand to Sit: 6: Modified independent (Device/Increase time);To bed;To chair/3-in-1;Without upper extremity assist Stand Pivot Transfers: 6: Modified independent (Device/Increase time) Locomotion  Ambulation Ambulation:  Yes Ambulation/Gait Assistance: 6: Modified independent (Device/Increase time) Ambulation Distance (Feet): 150 Feet Assistive device: Rolling walker Ambulation/Gait Assistance Details: 150' in controlled and community environments, 25' in ADL apartment to simulate home environment with RW and mod I. Gait Gait: Yes Gait Pattern: Impaired Gait Pattern: Wide base of support;Trunk flexed;Step-through pattern;Decreased stride length Stairs / Additional Locomotion Stairs: Yes Stairs Assistance: 5: Supervision;4: Min assist Stairs  Assistance Details (indicate cue type and reason): Patient able to negotiate 4 stairs with B handrails and supervision; Also performed 4 stairs with B HHA to simulate STE daughter's home (no handrails). Stair Management Technique: Two rails;Step to pattern;Forwards;No rails Number of Stairs: 4 Height of Stairs: 6 Curb: 5: Supervision (with RW) Architect: Yes Wheelchair Assistance: 5: Careers information officer: Both lower extermities Wheelchair Parts Management: Independent Distance: 150  Trunk/Postural Assessment  Cervical Assessment Cervical Assessment: Within Functional Limits Thoracic Assessment Thoracic Assessment: Within Functional Limits Lumbar Assessment Lumbar Assessment: Within Functional Limits Postural Control Postural Control: Within Functional Limits  Balance Balance Balance Assessed: Yes Static Sitting Balance Static Sitting - Balance Support: No upper extremity supported;Bilateral upper extremity supported;Feet supported Static Sitting - Level of Assistance: 6: Modified independent (Device/Increase time) Dynamic Sitting Balance Dynamic Sitting - Balance Support: Bilateral upper extremity supported;No upper extremity supported;Feet supported;During functional activity Dynamic Sitting - Level of Assistance: 6: Modified independent (Device/Increase time) Static Standing Balance Static Standing - Balance  Support: Bilateral upper extremity supported;During functional activity;No upper extremity supported Static Standing - Level of Assistance: 6: Modified independent (Device/Increase time) Extremity Assessment  RLE Assessment RLE Assessment:  (3+/5) LLE Assessment LLE Assessment: Within Functional Limits (3+/5)  See FIM for current functional status  Azai Gaffin S Elaine [name], PT, DPT 11/12/2013, 2:49 PM

## 2013-11-12 NOTE — Discharge Summary (Signed)
Discharge summary job # [id number]

## 2013-11-12 NOTE — Progress Notes (Signed)
Occupational Therapy Session Note  Patient Details  Name: Elaine [name] MRN: [id number] Date of Birth: 10/27/1940  Today's Date: 11/12/2013 Time: [id number] Time Calculation (min): 55 min  Short Term Goals: Week 1:  OT Short Term Goal 1 (Week 1): pt will complete sit<>stand MIN (A) chair / 3n1 OT Short Term Goal 2 (Week 1): Pt will complete LB bathing min (A) OT Short Term Goal 3 (Week 1): Pt will complete bed mobility MOD (A) OT Short Term Goal 4 (Week 1): Pt will dress LB mod (A) using reacher OT Short Term Goal 5 (Week 1): Pt will demonstrate 2 energy conservation techniques   Skilled Therapeutic Interventions/Progress Updates:    Pt seen for individual OT treatment session today with focus on ADL retraining session. Pt I'ly states energy conservation techniques and is noted to implement during bathing and dressing tasks today. Pt also able to state sternal precautions & implements during transfers and ADL's this am. Pt performs LB ADL's using a/e (long handled sponge and reacher), she is Mod I w/ sock aid but may require Min assist to don socks at home since she does not have one. Pt reports that family will be able to assist PRN. Pt should benefit from another OT session to review LTG's later today. She plans to go home w/ family PRN assist tomorrow.  Therapy Documentation Precautions:  Precautions Precautions: Sternal;Fall Precaution Comments: Rt UE PICC line, NO UE AROM > 90 degrees Restrictions Weight Bearing Restrictions: No Other Position/Activity Restrictions: pt able to state no pushing with UEs and not to raise UEs above 90 degrees     Pain: Pain Assessment Pain Assessment: No/denies pain Pain Score: 0-No pain Faces Pain Scale: No hurt Multiple Pain Sites: No ADL: ADL ADL Comments: See FIM Exercises:     See FIM for current functional status  Therapy/Group: Individual Therapy  Almyra Deforest 11/12/2013, 9:29 AM

## 2013-11-12 NOTE — Progress Notes (Signed)
Occupational Therapy Note  Patient Details  Name: Elaine [name] MRN: [id number] Date of Birth: 15-Oct-1940 Today's Date: 11/12/2013  Time: 1000-1027 Pt c/o 2/10 pain in RLE; RN aware and repositioned Individual Therapy  Pt resting in recliner with son present upon arrival.  Pt engaged in sit<>stand and dynamic standing tasks.  Discussed d/c plans and equipment needs.  Focus on safety awareness, dynamic standing, adhering to sternal precautions, and activity tolerance.    Leotis Shames [facility] 11/12/2013, 10:55 AM

## 2013-11-12 NOTE — Progress Notes (Signed)
Physical Therapy Note  Patient Details  Name: Elaine [name] MRN: [id number] Date of Birth: July 27, 1941 Today's Date: 11/12/2013  Time: 730-810 40 minutes  1:1 No c/o pain.  Pt able to perform sit to stand transfers and [facility] transfers and toileting with mod I.  Gait with RW 150', 50' with mod I in controlled environment, 1 x standing rest break during 150' gait, pt with good breathing techniques without cuing.  Step ups x 10 to curb step to simulate home entry, pt able to perform with RW with supervision with 1 seated rest break.  Pt reports she is excited for d/c home tomorrow.   DONAWERTH,KAREN 11/12/2013, 8:10 AM

## 2013-11-12 NOTE — Progress Notes (Signed)
Subjective/Complaints: Feeling well. Had another good day yesterday A 12 point review of systems has been performed and if not noted above is otherwise negative.   Objective: Vital Signs: Blood pressure 155/71, pulse 69, temperature 98.8 F (37.1 C), temperature source Oral, resp. rate 20, weight 116 kg (255 lb 11.7 oz), SpO2 95.00%. No results found. No results found for this basename: WBC, HGB, HCT, PLT,  in the last 72 hours  Recent Labs  11/10/13 1020 11/11/13 0555  NA 139 142  K 4.5 4.1  CL 100 102  GLUCOSE 222* 129*  BUN 39* 38*  CREATININE 1.66* 1.71*  CALCIUM 9.4 9.3   CBG (last 3)   Recent Labs  11/11/13 1645 11/11/13 2109 11/12/13 0731  GLUCAP 83 113* 108*    Wt Readings from Last 3 Encounters:  11/12/13 116 kg (255 lb 11.7 oz)  11/05/13 120 kg (264 lb 8.8 oz)  11/05/13 120 kg (264 lb 8.8 oz)    Physical Exam:  Constitutional: She is oriented to person, place, and time. No distress.  obese  HENT: only a few remaining teeth. Oral mucosa pink and moist  Head: Normocephalic and atraumatic.  Eyes: EOM are normal.  Right eye with cataracts  Neck: Normal range of motion. Neck supple. No JVD present. No tracheal deviation present. No thyromegaly present.  Cardiovascular: Normal rate.  Cardiac rate controlled. No murmurs, rubs, or gallops, 1+ LE Edema  Respiratory:  BS clear.  No wheezes, rales, or rhonchi  GI: Soft. Bowel sounds are normal. She exhibits no distension.  Obese  Neurological: She is alert and oriented to person, place, and time.  UE 4/5 prox to distally bilaterally .LLE 3 /5 HF, RLE 1+ HF (pain), BLLE: 3- to 3KE and 4+/5 ADF/APF bilaterally.  Skin:  midline chest incision clean and dry. Leg incision intact/tender along right inner thigh  Psychiatric: She has a normal mood and affect. Her behavior is normal. Judgment and thought content normal   Assessment/Plan: 1. Functional deficits secondary to deconditioning related to  multiple medical which require 3+ hours per day of interdisciplinary therapy in a comprehensive inpatient rehab setting. Physiatrist is providing close team supervision and 24 hour management of active medical problems listed below. Physiatrist and rehab team continue to assess barriers to discharge/monitor patient progress toward functional and medical goals. FIM: FIM - Bathing Bathing Steps Patient Completed: Chest;Right Arm;Left Arm;Abdomen;Front perineal area;Right upper leg;Left upper leg;Buttocks;Right lower leg (including foot);Left lower leg (including foot) Bathing: 5: Set-up assist to: Obtain items  FIM - Upper Body Dressing/Undressing Upper body dressing/undressing steps patient completed: Put head through opening of pull over shirt/dress;Thread/unthread left sleeve of pullover shirt/dress;Pull shirt over trunk;Thread/unthread right sleeve of pullover shirt/dresss Upper body dressing/undressing: 6: More than reasonable amount of time FIM - Lower Body Dressing/Undressing Lower body dressing/undressing steps patient completed: Pull pants up/down;Thread/unthread right pants leg;Thread/unthread left pants leg;Don/Doff right sock;Don/Doff left sock Lower body dressing/undressing: 5: Set-up assist to: Obtain clothing  FIM - Toileting Toileting steps completed by patient: Adjust clothing prior to toileting;Performs perineal hygiene;Adjust clothing after toileting Toileting Assistive Devices: Grab bar or rail for support;Toilet Aid/prosthesis/orthosis Toileting: 4: Steadying assist  FIM - Radio producer Devices: Recruitment consultant Transfers: 6-To toilet/ BSC;5-To toilet/BSC: Supervision (verbal cues/safety issues);5-From toilet/BSC: Supervision (verbal cues/safety issues)  FIM - Bed/Chair Transfer Bed/Chair Transfer Assistive Devices: Adult nurse Transfer: 6: Chair or W/C > Bed: No assist;6: Bed > Chair or W/C: No assist  FIM - Locomotion:  Wheelchair Distance: 60 Locomotion: Wheelchair: 2: Travels 60 - 149 ft with supervision, cueing or coaxing FIM - Locomotion: Ambulation Locomotion: Ambulation Assistive Devices: Administrator Ambulation/Gait Assistance: 5: Supervision Locomotion: Ambulation: 6: Travels 150 ft or more with assistive device/no helper  Comprehension Comprehension Mode: Auditory Comprehension: 5-Understands complex 90% of the time/Cues < 10% of the time  Expression Expression Mode: Verbal Expression: 5-Expresses complex 90% of the time/cues < 10% of the time  Social Interaction Social Interaction: 7-Interacts appropriately with others - No medications needed.  Problem Solving Problem Solving: 6-Solves complex problems: With extra time  Memory Memory: 6-More than reasonable amt of time  Medical Problem List and Plan:  1. Deconditioning after STEMI/CABG 10/23/2013. Sternal precautions  2. DVT Prophylaxis/Anticoagulation: Coumadin per pharmacy. Latest INR 2.11 Monitor platelet counts and any signs of bleeding  3. Pain Management: Oxycodone as needed. Monitor with increased mobility  4. Neuropsych: This patient is capable of making decisions on her own behalf.  5. Hypertension/atrial fibrillation. Continue amiodarone as directed, Coreg 6.25 mg twice a day, lisinopril 2.5 mg daily. Rate control. No chest pain or shortness of breath  6. Acute on chronic anemia. Latest hemoglobin 9.8. Continue iron supplement  7. Acute pulmonary edema with congestive heart failure.    -bun/cr still elevated but stable  -weight is down 6kg since admit but I's and O's 3480+  -continue decreased lasix  40mg  bid --will ask cardiology for recs on lasix dose at discharge   -follow up labs today instead of thursday 8. Hypothyroidism. Synthroid  9. Type 2 diabetes mellitus. Hemoglobin A1c 5.7. Blood sugars 111-243. Continue sliding scale insulin.   Patient on Glucophage 1000 mg twice a day prior to admission not resumed  secondary to elevated creatinine 1.7. Will resume as needed and as possible  -reasonable to fair control at present 10. Hyperlipidemia. Lipitor  LOS (Days) 7 A FACE TO FACE EVALUATION WAS PERFORMED  Cindy Fullman T 11/12/2013 8:17 AM

## 2013-11-13 LAB — GLUCOSE, CAPILLARY: Glucose-Capillary: 129 mg/dL — ABNORMAL HIGH (ref 70–99)

## 2013-11-13 LAB — PROTIME-INR
INR: 2.53 — ABNORMAL HIGH (ref 0.00–1.49)
Prothrombin Time: 26.4 seconds — ABNORMAL HIGH (ref 11.6–15.2)

## 2013-11-13 MED ORDER — LISINOPRIL 2.5 MG PO TABS: 2.5000 mg | ORAL_TABLET | Freq: Every day | ORAL | Status: DC

## 2013-11-13 MED ORDER — ASPIRIN EC 81 MG PO TBEC: 81.0000 mg | DELAYED_RELEASE_TABLET | Freq: Every day | ORAL | Status: DC

## 2013-11-13 MED ORDER — FERROUS SULFATE 325 (65 FE) MG PO TABS: 325.0000 mg | ORAL_TABLET | Freq: Every day | ORAL | Status: DC

## 2013-11-13 MED ORDER — PANTOPRAZOLE SODIUM 40 MG PO TBEC: 40.0000 mg | DELAYED_RELEASE_TABLET | Freq: Every day | ORAL | Status: DC

## 2013-11-13 MED ORDER — WARFARIN SODIUM 2.5 MG PO TABS: 2.5000 mg | ORAL_TABLET | Freq: Every day | ORAL | Status: DC

## 2013-11-13 MED ORDER — LEVOTHYROXINE SODIUM 112 MCG PO TABS: 112.0000 ug | ORAL_TABLET | Freq: Every day | ORAL | Status: DC

## 2013-11-13 MED ORDER — CARVEDILOL 6.25 MG PO TABS: 6.2500 mg | ORAL_TABLET | Freq: Two times a day (BID) | ORAL | Status: DC

## 2013-11-13 MED ORDER — OXYCODONE HCL 5 MG PO TABS: 5.0000 mg | ORAL_TABLET | ORAL | Status: DC | PRN

## 2013-11-13 MED ORDER — AMIODARONE HCL 200 MG PO TABS: 200.0000 mg | ORAL_TABLET | Freq: Every day | ORAL | Status: DC

## 2013-11-13 MED ORDER — POTASSIUM CHLORIDE CRYS ER 20 MEQ PO TBCR: 20.0000 meq | EXTENDED_RELEASE_TABLET | Freq: Every day | ORAL | Status: DC

## 2013-11-13 MED ORDER — FUROSEMIDE 40 MG PO TABS: 40.0000 mg | ORAL_TABLET | Freq: Two times a day (BID) | ORAL | Status: DC

## 2013-11-13 NOTE — Discharge Instructions (Signed)
Inpatient Rehab Discharge Instructions  Elaine [name] Discharge date and time: No discharge date for patient encounter.   Activities/Precautions/ Functional Status: Activity: Sternal precautions Diet: Mechanical soft Wound Care: none needed Functional status:  ___ No restrictions     ___ Walk up steps independently _x__ 24/7 supervision/assistance   ___ Walk up steps with assistance ___ Intermittent supervision/assistance  ___ Bathe/dress independently ___ Walk with walker     ___ Bathe/dress with assistance ___ Walk Independently    ___ Shower independently _x__ Walk with assistance    ___ Shower with assistance ___ No alcohol     ___ Return to work/school ________   COMMUNITY REFERRALS UPON DISCHARGE:    Home Health:   PT     RN                    Agency: [facility] Phone: [phone]   Medical Equipment/Items Ordered: wheelchair, cushion, 3n1 commode                                                     Agency/Supplier: [facility] @ [phone]      Special Instructions: Home health nurse to check INR on Monday, 11/17/2013 with results to [facility] [phone] fax number [phone]   My questions have been answered and I understand these instructions. I will adhere to these goals and the provided educational materials after my discharge from the hospital.  Patient/Caregiver Signature _______________________________ Date __________  Clinician Signature _______________________________________ Date __________  Please bring this form and your medication list with you to all your follow-up doctor's appointments.

## 2013-11-13 NOTE — Progress Notes (Signed)
Subjective/Complaints: In good spirits. Excited to go home tomorrow A 12 point review of systems has been performed and if not noted above is otherwise negative.   Objective: Vital Signs: Blood pressure 103/62, pulse 62, temperature 98.4 F (36.9 C), temperature source Oral, resp. rate 17, weight 115.7 kg (255 lb 1.2 oz), SpO2 97.00%. No results found. No results found for this basename: WBC, HGB, HCT, PLT,  in the last 72 hours  Recent Labs  11/11/13 0555 11/12/13 0945  NA 142 140  K 4.1 4.7  CL 102 100  GLUCOSE 129* 204*  BUN 38* 36*  CREATININE 1.71* 1.69*  CALCIUM 9.3 9.3   CBG (last 3)   Recent Labs  11/12/13 1644 11/12/13 2101 11/13/13 0723  GLUCAP 117* 174* 129*    Wt Readings from Last 3 Encounters:  11/13/13 115.7 kg (255 lb 1.2 oz)  11/05/13 120 kg (264 lb 8.8 oz)  11/05/13 120 kg (264 lb 8.8 oz)    Physical Exam:  Constitutional: She is oriented to person, place, and time. No distress.  obese  HENT: only a few remaining teeth. Oral mucosa pink and moist  Head: Normocephalic and atraumatic.  Eyes: EOM are normal.  Right eye with cataracts  Neck: Normal range of motion. Neck supple. No JVD present. No tracheal deviation present. No thyromegaly present.  Cardiovascular: Normal rate.  Cardiac rate controlled. No murmurs, rubs, or gallops, 1+ LE Edema  Respiratory:  BS clear.  No wheezes, rales, or rhonchi  GI: Soft. Bowel sounds are normal. She exhibits no distension.  Obese  Neurological: She is alert and oriented to person, place, and time.  UE 4/5 prox to distally bilaterally .LLE 3 /5 HF, RLE 1+ HF (pain), BLLE: 3- to 3KE and 4+/5 ADF/APF bilaterally.  Skin:  midline chest incision clean and dry. Leg incision intact/tender along right inner thigh  Psychiatric: She has a normal mood and affect. Her behavior is normal. Judgment and thought content normal   Assessment/Plan: 1. Functional deficits secondary to deconditioning related to  multiple medical which require 3+ hours per day of interdisciplinary therapy in a comprehensive inpatient rehab setting. Physiatrist is providing close team supervision and 24 hour management of active medical problems listed below. Physiatrist and rehab team continue to assess barriers to discharge/monitor patient progress toward functional and medical goals.  Goals met. Doing quite well. Follow up with surgery/cards/me  FIM: FIM - Bathing Bathing Steps Patient Completed: Chest;Right Arm;Left Arm;Abdomen;Front perineal area;Right upper leg;Left upper leg;Buttocks;Right lower leg (including foot);Left lower leg (including foot) Bathing: 5: Set-up assist to: Obtain items  FIM - Upper Body Dressing/Undressing Upper body dressing/undressing steps patient completed: Put head through opening of pull over shirt/dress;Thread/unthread left sleeve of pullover shirt/dress;Pull shirt over trunk;Thread/unthread right sleeve of pullover shirt/dresss Upper body dressing/undressing: 6: More than reasonable amount of time FIM - Lower Body Dressing/Undressing Lower body dressing/undressing steps patient completed: Pull pants up/down;Thread/unthread right pants leg;Thread/unthread left pants leg Lower body dressing/undressing: 5: Set-up assist to: Obtain clothing  FIM - Toileting Toileting steps completed by patient: Adjust clothing prior to toileting;Performs perineal hygiene;Adjust clothing after toileting Toileting Assistive Devices: Grab bar or rail for support;Toilet Aid/prosthesis/orthosis Toileting: 6: Assistive device: No helper  FIM - Radio producer Devices: Recruitment consultant Transfers: 6-More than reasonable amt of time;6-Assistive device: No helper;6-From toilet/BSC  FIM - Control and instrumentation engineer Devices: Copy: 6: Supine > Sit: No assist;6: Sit >  Supine: No assist;6: Bed > Chair or W/C: No assist;6: Chair or W/C >  Bed: No assist  FIM - Locomotion: Wheelchair Distance: 150 Locomotion: Wheelchair: 5: Travels 150 ft or more: maneuvers on rugs and over door sills with supervision, cueing or coaxing FIM - Locomotion: Ambulation Locomotion: Ambulation Assistive Devices: Administrator Ambulation/Gait Assistance: 6: Modified independent (Device/Increase time) Locomotion: Ambulation: 6: Travels 150 ft or more with assistive device/no helper  Comprehension Comprehension Mode: Auditory Comprehension: 5-Understands complex 90% of the time/Cues < 10% of the time  Expression Expression Mode: Verbal Expression: 5-Expresses complex 90% of the time/cues < 10% of the time  Social Interaction Social Interaction: 7-Interacts appropriately with others - No medications needed.  Problem Solving Problem Solving: 6-Solves complex problems: With extra time  Memory Memory: 6-More than reasonable amt of time  Medical Problem List and Plan:  1. Deconditioning after STEMI/CABG 10/23/2013. Sternal precautions  2. DVT Prophylaxis/Anticoagulation: Coumadin per pharmacy. Latest INR 2.11 Monitor platelet counts and any signs of bleeding  3. Pain Management: Oxycodone as needed. Monitor with increased mobility  4. Neuropsych: This patient is capable of making decisions on her own behalf.  5. Hypertension/atrial fibrillation. Continue amiodarone as directed, Coreg 6.25 mg twice a day, lisinopril 2.5 mg daily. Rate control. No chest pain or shortness of breath  6. Acute on chronic anemia. Latest hemoglobin 9.8. Continue iron supplement  7. Acute pulmonary edema with congestive heart failure.    -bun/cr still elevated but stable  -weight is down 6kg since admit but I's and O's 3480+  -continue decreased lasix  29m bid --cards continued dosing/ pm dose earlier to help with nocturia   -follow up labs stable 8. Hypothyroidism. Synthroid  9. Type 2 diabetes mellitus. Hemoglobin A1c 5.7. Blood sugars 111-243. Continue sliding  scale insulin.   Patient on Glucophage 1000 mg twice a day prior to admission not resumed secondary to elevated creatinine 1.7. Will resume as needed and as possible  -reasonable to fair control  10. Hyperlipidemia. Lipitor  LOS (Days) 8 A FACE TO FACE EVALUATION WAS PERFORMED  Thaer Miyoshi T 11/13/2013 8:38 AM

## 2013-11-13 NOTE — Progress Notes (Signed)
Social Work  Discharge Note  The overall goal for the admission was met for:   Discharge location: Yes - home with family to provide any needed assistance  Length of Stay: Yes - 7 days  Discharge activity level: Yes - modified independent  Home/community participation: Yes  Services provided included: MD, RD, PT, OT, RN, TR, Pharmacy and SW  Financial Services: Medicare Scientist, clinical (histocompatibility and immunogenetics) [facility])  Follow-up services arranged: Home Health: RN, PT via [facility], DME: 20x18 lightweight w/c, cushion, 3n1 commode via [facility]  and Patient/Family has no preference for HH/DME agencies  Comments (or additional information):  Patient/Family verbalized understanding of follow-up arrangements: Yes  Individual responsible for coordination of the follow-up plan: patient  Confirmed correct DME delivered: Malic Rosten 11/13/2013    Elke Holtry

## 2013-11-13 NOTE — Progress Notes (Signed)
Coumadin per pharmacy  [facility]: Warfarin for afib -started after surgery, INR is therapeutic at 2.53 on 2.5mg  daily for days. Had amio dose reduction on 3/9.    Goal of Therapy:  INR 2-3 Monitor platelets by anticoagulation protocol: Yes  Plan:  Coumadin 2.5mg  daily INR TThSat

## 2013-11-13 NOTE — Progress Notes (Signed)
Patient discharged home.  Left floor via wheelchair, escorted by nursing staff and family.  All patient belongings and equipment sent with patient.  Patient and family verbalized understanding of discharge instructions, no questions asked.  VSS.  Appears to be in no immediate distress at this time.  Brita Romp, RN

## 2013-11-14 ENCOUNTER — Encounter: Payer: Self-pay | Admitting: Cardiology

## 2013-11-17 ENCOUNTER — Ambulatory Visit (INDEPENDENT_AMBULATORY_CARE_PROVIDER_SITE_OTHER): Payer: Medicare HMO | Admitting: Pharmacist Clinician (PhC)/ Clinical Pharmacy Specialist

## 2013-11-17 ENCOUNTER — Encounter: Payer: Self-pay | Admitting: Cardiology

## 2013-11-17 ENCOUNTER — Ambulatory Visit (INDEPENDENT_AMBULATORY_CARE_PROVIDER_SITE_OTHER): Payer: Medicare HMO | Admitting: Cardiology

## 2013-11-17 VITALS — BP 102/64 | HR 68 | Ht 62.0 in | Wt 259.0 lb

## 2013-11-17 DIAGNOSIS — I2109 ST elevation (STEMI) myocardial infarction involving other coronary artery of anterior wall: Secondary | ICD-10-CM

## 2013-11-17 DIAGNOSIS — Z7901 Long term (current) use of anticoagulants: Secondary | ICD-10-CM

## 2013-11-17 DIAGNOSIS — I4891 Unspecified atrial fibrillation: Secondary | ICD-10-CM

## 2013-11-17 DIAGNOSIS — E8770 Fluid overload, unspecified: Secondary | ICD-10-CM

## 2013-11-17 DIAGNOSIS — E785 Hyperlipidemia, unspecified: Secondary | ICD-10-CM

## 2013-11-17 DIAGNOSIS — Z951 Presence of aortocoronary bypass graft: Secondary | ICD-10-CM

## 2013-11-17 DIAGNOSIS — E8779 Other fluid overload: Secondary | ICD-10-CM

## 2013-11-17 DIAGNOSIS — E118 Type 2 diabetes mellitus with unspecified complications: Secondary | ICD-10-CM

## 2013-11-17 LAB — POCT INR: INR: 2.7

## 2013-11-17 NOTE — Progress Notes (Signed)
11/18/2013   PCP: Marton Redwood, MD   Chief Complaint  Patient presents with  . Follow-up    S/P CABG    Primary Cardiologist: Dr. Ellyn Hack   HPI:  73 year old right-handed female, with history of hypertension, diabetes mellitus, and former tobacco abuse, was independent prior to admission caring for her husband. Admitted on October 20, 2013, with persistent chest pressure and  shortness of breath with lower extremity edema. Noted to be in atrial fibrillation with a rate in the low 100s. There were some ST-segment abnormalities concerning for possible STEMI. Cardiac catheterization demonstrated moderate left main coronary artery stenosis with high-grade proximal stenosis of the left anterior descending coronary arteries three vessel disease. Underwent CABG x3 as well as clipping of left atrial appendage on October 23, 2013, per Dr. Roxy Manns.  She was placed on coumadin for PAF.  Due to deconditioning she went to rehab.  She was recently discharged and her daughter is watching over her at home.  Pt states she is doing well, though with home diet, glucose is climbing and she has some lower ext edema despite lasix BID.    We discussed diet both salt and sugar.  She admits to needing reminders on her diet.  She says she feels her heart race some at night and uses her incentive spirometer and it improves.  INR today 2.7.  Some incisional pain no SOB.       Allergies  Allergen Reactions  . Tape Rash    Adhesive tape    Current Outpatient Prescriptions  Medication Sig Dispense Refill  . amiodarone (PACERONE) 200 MG tablet Take 1 tablet (200 mg total) by mouth daily.  30 tablet  1  . aspirin EC 81 MG tablet Take 1 tablet (81 mg total) by mouth daily.      . carvedilol (COREG) 6.25 MG tablet Take 1 tablet (6.25 mg total) by mouth 2 (two) times daily with a meal.  60 tablet  1  . ferrous sulfate 325 (65 FE) MG tablet Take 1 tablet (325 mg total) by mouth daily with breakfast.  30  tablet  3  . furosemide (LASIX) 40 MG tablet Take 1 tablet (40 mg total) by mouth 2 (two) times daily.  60 tablet  1  . levothyroxine (SYNTHROID, LEVOTHROID) 112 MCG tablet Take 1 tablet (112 mcg total) by mouth daily before breakfast.  30 tablet  1  . lisinopril (PRINIVIL,ZESTRIL) 2.5 MG tablet Take 1 tablet (2.5 mg total) by mouth daily.  30 tablet  1  . oxyCODONE (OXY IR/ROXICODONE) 5 MG immediate release tablet Take 1-2 tablets (5-10 mg total) by mouth every 3 (three) hours as needed for moderate pain.  60 tablet  0  . pantoprazole (PROTONIX) 40 MG tablet Take 1 tablet (40 mg total) by mouth daily.  30 tablet  1  . potassium chloride SA (K-DUR,KLOR-CON) 20 MEQ tablet Take 1 tablet (20 mEq total) by mouth daily.  30 tablet  1  . prednisoLONE acetate (PRED FORTE) 1 % ophthalmic suspension Place 1 drop into the right eye daily.      . rosuvastatin (CRESTOR) 20 MG tablet Take 10 mg by mouth daily.      [name] warfarin (COUMADIN) 2.5 MG tablet Take 1 tablet (2.5 mg total) by mouth daily at 6 PM.  30 tablet  1   No current facility-administered medications for this visit.    Past Medical History  Diagnosis Date  . Arthritis   .  Heart murmur   . Hyperlipidemia   . Hypertension   . Hypothyroidism   . Ischemic cardiomyopathy 10/20/2013    Echo 2/23: EF 20-25%; mild LVH. anteroseptal Akinesis; mid-apical anterior, inferior & inferoseptal akinesis; apical-lateral akinesis; apical akinesis; Grade 1 D Dysfxn.; Mild-Mod LA dilatoin; mild MR; Mod PHTN  . Acute pulmonary edema with congestive heart failure 10/20/2013    Resolved  . Left bundle branch block (LBBB) on electrocardiogram   . Atrial fibrillation 10/20/2013  . OSTEOARTHRITIS 08/06/2006  . Morbid obesity   . Diabetic neuropathy   . Type II diabetes mellitus with complication [id number]  . Chronic kidney disease   . S/P CABG x 3 10/23/2013    LIMA to LAD, SVG to OM2, SVG to RPLB, EVH via right thigh  . Acute myocardial infarction of anterior  wall 10/20/2013    Afib, LBBB  . CAD, multiple vessel 10/20/2013    LM, LAD & RCA --> CABG    Past Surgical History  Procedure Laterality Date  . Corneal transplant  2011    right eye  . Total hip arthroplasty  2010, 2011    right 2010, left 2011  . Abdominal hysterectomy    . Coronary artery bypass graft N/A 10/23/2013    Procedure: CORONARY ARTERY BYPASS GRAFTING (CABG);  Surgeon: Rexene Alberts, MD;  Location: [place];  Service: Open Heart Surgery;  Laterality: N/A;  . Intraoperative transesophageal echocardiogram N/A 10/23/2013    Procedure: INTRAOPERATIVE TRANSESOPHAGEAL ECHOCARDIOGRAM;  Surgeon: Rexene Alberts, MD;  Location: [place];  Service: Open Heart Surgery;  Laterality: N/A;  . Clipping of atrial appendage N/A 10/23/2013    Procedure: CLIPPING OF ATRIAL APPENDAGE;  Surgeon: Rexene Alberts, MD;  Location: [place];  Service: Open Heart Surgery;  Laterality: N/A;  . Cardiac catheterization  10/20/2013    Distal LM ~70%, LAD - ostial 70%, prox 80, 95 & 95%; RCA prox 70%, mid 80%; minimal Cx disease  . Coronary artery bypass graft  10/23/2013    LIMA-LAD, SVG-OM2, SVG-RPL    [id number] colds or fevers, decreased weight from hospital Skin:no rashes or ulcers HEENT:no blurred vision, no congestion CV:see HPI PUL:see HPI GI:no diarrhea constipation or melena, no indigestion GU:no hematuria, no dysuria MS:no joint pain, no claudication Neuro:no syncope, no lightheadedness Endo:+ diabetes with climbing glucose now at home, no thyroid disease  PHYSICAL EXAM BP 102/64  Pulse 68  Ht 5\' 2"  (1.575 m)  Wt 259 lb (117.482 kg)  BMI 47.36 kg/m2 General:Pleasant affect, NAD Skin:Warm and dry, brisk capillary refill HEENT:normocephalic, sclera clear, mucus membranes moist Neck:supple, no JVD, no bruits  Heart:S1S2 RRR without murmur, gallup, rub or click Lungs:clear without rales, rhonchi, or wheezes [id number], soft, non tender, + BS, do not palpate liver spleen or masses Ext:1+  lower ext edema, 2+ pedal pulses, 2+ radial pulses Neuro:alert and oriented, MAE, follows commands, + facial symmetry  EKG:SR 1st degree AV block, intraventricular conduction delay.  No acute changes.  ASSESSMENT AND PLAN S/P CABG x 3 LIMA to LAD, SVG to OM2, SVG to RPLB, EVH via right thigh Feb 26,2015 Stable today, still deconditioned but has been discharged from rehab.  Acute myocardial infarction of anterior wall, subsequent episode of care Leading to CABG, see above  Atrial fibrillation Clipping of atrial appendage with CABG, currently in SR   HYPERLIPIDEMIA Will need to recheck on next visit, on crestor  Long term (current) use of anticoagulants Checked today.    Volume overload Some lower ext edema,  lungs clear, has been increasing salt in diet, we discussed monitoring salt.  Type II diabetes mellitus with complication Followed by PCP, she is to see this week.  Glucose climbing at home.   Follow up with Dr. Ellyn Hack in 4-5 weeks, she will call if increased swelling or more irregular HR.

## 2013-11-17 NOTE — Patient Instructions (Signed)
Watch diet.  Decrease salt, watch you glucose.  Call if shortness of breath  Weigh daily Call [phone] if weight climbs more than 3 pounds in a day or 5 pounds in a week. No salt to very little salt in your diet.  No more than 2000 mg in a day. Call if increased shortness of breath or increased swelling.   Follow up with Dr. Ellyn Hack in 4-5 weeks.

## 2013-11-18 DIAGNOSIS — E8770 Fluid overload, unspecified: Secondary | ICD-10-CM | POA: Insufficient documentation

## 2013-11-18 NOTE — Assessment & Plan Note (Signed)
Leading to CABG, see above

## 2013-11-18 NOTE — Assessment & Plan Note (Signed)
Followed by PCP, she is to see this week.  Glucose climbing at home.

## 2013-11-18 NOTE — Assessment & Plan Note (Signed)
Some lower ext edema, lungs clear, has been increasing salt in diet, we discussed monitoring salt.

## 2013-11-18 NOTE — Assessment & Plan Note (Addendum)
LIMA to LAD, SVG to OM2, SVG to RPLB, EVH via right thigh Feb 26,2015 Stable today, still deconditioned but has been discharged from rehab.

## 2013-11-18 NOTE — Assessment & Plan Note (Signed)
Clipping of atrial appendage with CABG, currently in SR

## 2013-11-18 NOTE — Assessment & Plan Note (Signed)
Checked today.

## 2013-11-18 NOTE — Assessment & Plan Note (Signed)
Will need to recheck on next visit, on crestor

## 2013-11-19 ENCOUNTER — Other Ambulatory Visit: Payer: Self-pay | Admitting: *Deleted

## 2013-11-19 DIAGNOSIS — I2510 Atherosclerotic heart disease of native coronary artery without angina pectoris: Secondary | ICD-10-CM

## 2013-11-20 NOTE — Progress Notes (Signed)
Occupational Therapy Discharge Summary  Patient Details  Name: Elaine [name] MRN: [id number] Date of Birth: 07-30-1941  Today's Date: 11/12/2013 Time:  8:30- 9:25 am Time Calculation: 73 Min    Patient has met 10 of 10 long term goals due to improved activity tolerance, improved balance, postural control, ability to compensate for deficits, improved coordination and implementation of precautions during ADL's, light homemaking and functional transfers. .  Patient to discharge at overall Modified Independent to [place] level.  Patient's care partner is independent to provide the necessary physical assistance at discharge.    Reasons goals not met: N/A  Recommendation:  Patient will benefit from ongoing skilled OT services in home health setting to continue to advance functional skills in the areas of BADL and iADL. Pt plans to d/c home to her daughter's house w/ PRN assist. Pt's son was educated verbally on multiple occasions re: pt need for rest breaks, precautions & areas of ADL's where pt may continue to require assistance at d/c. Both he and pt verbalized understanding of these recommendations.  Equipment: Long handled sponge, Long Acupuncturist. May need sock aid. Pt states that she has access to a shower seat  Reasons for discharge: treatment goals met and discharge from hospital  Patient/family agrees with progress made and goals achieved: Yes  OT Discharge Refer to OT progress note dated 11/12/13 for details re: precautions, vitals, pain, skilled intervention performed etc...      ADL ADL ADL Comments: See FIM                   See FIM for current functional status  Almyra Deforest 11/20/2013, 11:40 AM

## 2013-11-21 ENCOUNTER — Ambulatory Visit (INDEPENDENT_AMBULATORY_CARE_PROVIDER_SITE_OTHER): Payer: Medicare HMO | Admitting: Pharmacist Clinician (PhC)/ Clinical Pharmacy Specialist

## 2013-11-21 DIAGNOSIS — I4891 Unspecified atrial fibrillation: Secondary | ICD-10-CM

## 2013-11-21 DIAGNOSIS — Z7901 Long term (current) use of anticoagulants: Secondary | ICD-10-CM

## 2013-11-21 LAB — POCT INR: INR: 2.3

## 2013-11-24 ENCOUNTER — Ambulatory Visit
Admission: RE | Admit: 2013-11-24 | Discharge: 2013-11-24 | Disposition: A | Discharge status: Home / Self Care | Payer: Medicare HMO | Source: Ambulatory Visit | Attending: Thoracic Surgery (Cardiothoracic Vascular Surgery) | Admitting: Thoracic Surgery (Cardiothoracic Vascular Surgery)

## 2013-11-24 ENCOUNTER — Encounter: Payer: Self-pay | Admitting: Thoracic Surgery (Cardiothoracic Vascular Surgery)

## 2013-11-24 ENCOUNTER — Ambulatory Visit (INDEPENDENT_AMBULATORY_CARE_PROVIDER_SITE_OTHER): Payer: Self-pay | Admitting: Thoracic Surgery (Cardiothoracic Vascular Surgery)

## 2013-11-24 VITALS — BP 98/55 | HR 64 | Resp 16 | Ht 62.0 in | Wt 246.0 lb

## 2013-11-24 DIAGNOSIS — I2510 Atherosclerotic heart disease of native coronary artery without angina pectoris: Secondary | ICD-10-CM

## 2013-11-24 DIAGNOSIS — Z09 Encounter for follow-up examination after completed treatment for conditions other than malignant neoplasm: Secondary | ICD-10-CM

## 2013-11-24 DIAGNOSIS — Z951 Presence of aortocoronary bypass graft: Secondary | ICD-10-CM

## 2013-11-24 MED ORDER — FUROSEMIDE 40 MG PO TABS: 80.0000 mg | ORAL_TABLET | Freq: Two times a day (BID) | ORAL | Status: DC

## 2013-11-24 NOTE — Patient Instructions (Signed)
Increase lasix to 80 mg (2 tablets) by mouth twice daily and continue to monitor your weight daily.  The patient should continue to avoid any heavy lifting or strenuous use of arms or shoulders for at least a total of three months from the time of surgery.  The patient is reminded to make every effort to keep their diabetes under very tight control.  They should follow up closely with their primary care physician or endocrinologist and strive to keep their hemoglobin A1c levels as low as possible, preferably near or below 6.0  The patient is encouraged to enroll and participate in the outpatient cardiac rehab program beginning once she completes her home PT therapy.

## 2013-11-24 NOTE — Progress Notes (Signed)
[address]       [place] [address]             [phone]     CARDIOTHORACIC SURGERY OFFICE NOTE  Referring Provider is Leonie Man, MD PCP is Marton Redwood, MD   HPI:  Patient returns for routine followup status post coronary artery bypass grafting x3 with clipping of LA appendage on 10/23/2013 for left main disease with severe three-vessel coronary artery disease status post acute myocardial infarction.  She presented with resting chest pain and shortness of breath associated with rapid atrial fibrillation and left oral branch block. She was found to have severe left ventricular dysfunction with ejection fraction estimated less than 25% and recent preoperative acute transmural myocardial infarction involving the anterior wall and septum. Her postoperative recovery was slow but overall uncomplicated.  She did experience some episodes of pictures mitral fibrillation postoperatively for which she was treated with amiodarone and anticoagulated using Coumadin. She was ultimately discharged to the inpatient rehabilitation unit where she continued to gradually improve.  She was ultimately discharged home a week and a half ago and she returns to our office for routine followup today. She was seen in followup last week by Cecilie Kicks at Dr. Allison Quarry office, although no changes in her medications were made at that time.  The patient is accompanied to the office today by her son. She reports feeling well and continuing to make gradual improvement. She has not been walking very much but she states that she is making slow improvement.  Her primary complaint is that of persistent lower extremity edema with some associated discomfort particularly in the right thigh and leg. She has minimal pain in her chest and she denies any problems with shortness of breath. She has not had tachypalpitations or dizzy spells. Her prothrombin time is been monitored and she remains therapeutic on  Coumadin.  Her blood sugars have been under good control.  She states that her weight has continued to gradually trend down since hospital discharge.   Current Outpatient Prescriptions  Medication Sig Dispense Refill  . amiodarone (PACERONE) 200 MG tablet Take 1 tablet (200 mg total) by mouth daily.  30 tablet  1  . aspirin EC 81 MG tablet Take 1 tablet (81 mg total) by mouth daily.      . carvedilol (COREG) 6.25 MG tablet Take 1 tablet (6.25 mg total) by mouth 2 (two) times daily with a meal.  60 tablet  1  . ferrous sulfate 325 (65 FE) MG tablet Take 1 tablet (325 mg total) by mouth daily with breakfast.  30 tablet  3  . furosemide (LASIX) 40 MG tablet Take 1 tablet (40 mg total) by mouth 2 (two) times daily.  60 tablet  1  . levothyroxine (SYNTHROID, LEVOTHROID) 112 MCG tablet Take 1 tablet (112 mcg total) by mouth daily before breakfast.  30 tablet  1  . lisinopril (PRINIVIL,ZESTRIL) 2.5 MG tablet Take 1 tablet (2.5 mg total) by mouth daily.  30 tablet  1  . oxyCODONE (OXY IR/ROXICODONE) 5 MG immediate release tablet Take 1-2 tablets (5-10 mg total) by mouth every 3 (three) hours as needed for moderate pain.  60 tablet  0  . pantoprazole (PROTONIX) 40 MG tablet Take 1 tablet (40 mg total) by mouth daily.  30 tablet  1  . potassium chloride SA (K-DUR,KLOR-CON) 20 MEQ tablet Take 1 tablet (20 mEq total) by mouth daily.  30 tablet  1  .  prednisoLONE acetate (PRED FORTE) 1 % ophthalmic suspension Place 1 drop into the right eye daily.      . rosuvastatin (CRESTOR) 20 MG tablet Take 10 mg by mouth daily.      [name] warfarin (COUMADIN) 2.5 MG tablet Take 1 tablet (2.5 mg total) by mouth daily at 6 PM.  30 tablet  1   No current facility-administered medications for this visit.      Physical Exam:   BP 98/55  Pulse 64  Resp 16  Ht 5\' 2"  (1.575 m)  Wt 246 lb (111.585 kg)  BMI 44.98 kg/m2  SpO2 98%  General:  Morbidly obese but well-appearing  Chest:   Clear to auscultation with slightly  diminished breath sounds at lung bases, left greater than right  CV:   Regular rate and rhythm  Incisions:  Healing nicely, sternum is stable  Abdomen:  Soft and nontender  Extremities:  Severe bilateral lower extremity edema  Diagnostic Tests:  CHEST - 2 VIEW  COMPARISON: DG CHEST 2 VIEW dated 11/05/2013  FINDINGS:  A moderate left pleural effusion remains which is felt to be  slightly smaller in volume compared to the most recent chest x-ray.  There is no evidence of pulmonary edema, airspace consolidation or  pneumothorax. The heart size and mediastinal contours are stable.  IMPRESSION:  Slightly smaller left pleural effusion.  Electronically Signed  By: Aletta Edouard M.D.  On: 11/24/2013 09:55    Impression:  Patient is making slow but steady progress status post coronary artery bypass grafting x3 with clipping of left atrial appendage. She has underlying severe ischemic cardiomyopathy with severe left ventricular dysfunction and chronic combined systolic and diastolic congestive heart failure. She had paroxysmal atrial fibrillation both pre-and postoperatively, but she is maintaining sinus rhythm on amiodarone. She is therapeutically anticoagulated on Coumadin. Although she states that her weight has been continuing to gradually decrease since hospital discharge, she still has significant long overload with severe bilateral lower extremity edema.  She has a small to moderate sized left pleural effusion which has decreased somewhat in size over the past few weeks.  Plan:  I've instructed the patient to increase her dose of Lasix to 80 mg twice daily.  We have not made any other changes in her medications.  I've encouraged patient to continue to increase her physical activity as tolerated. Once she completes her home physical therapy I've encouraged her to enroll in the outpatient cardiac rehabilitation program.  However, at this point it does not appear that she has adequate  mobility.  We will plan to see her back in 6 weeks with followup chest x-ray to make sure her pleural effusion is resolving. We will defer any subsequent manipulation of medical therapy for heart failure and fluid overload to Dr. Ellyn Hack and colleagues.   Valentina Gu. Roxy Manns, MD 11/24/2013 10:22 AM

## 2013-11-27 ENCOUNTER — Telehealth: Payer: Self-pay | Admitting: Cardiology

## 2013-11-27 NOTE — Telephone Encounter (Signed)
Calling to find out when is [name] next Coumadin due .[name] Please Call   Thanks

## 2013-11-27 NOTE — Telephone Encounter (Signed)
INR due 4/3, RN notified

## 2013-11-28 ENCOUNTER — Telehealth: Payer: Self-pay | Admitting: *Deleted

## 2013-11-28 ENCOUNTER — Ambulatory Visit (INDEPENDENT_AMBULATORY_CARE_PROVIDER_SITE_OTHER): Payer: Medicare HMO | Admitting: Pharmacist Clinician (PhC)/ Clinical Pharmacy Specialist

## 2013-11-28 DIAGNOSIS — Z7901 Long term (current) use of anticoagulants: Secondary | ICD-10-CM

## 2013-11-28 DIAGNOSIS — I4891 Unspecified atrial fibrillation: Secondary | ICD-10-CM

## 2013-11-28 LAB — POCT INR: INR: 2.7

## 2013-11-28 NOTE — Telephone Encounter (Signed)
Elaine [name] from [facility] was calling in PT/INR results   PT 32.3 INR 2.7

## 2013-12-03 ENCOUNTER — Ambulatory Visit (INDEPENDENT_AMBULATORY_CARE_PROVIDER_SITE_OTHER): Payer: Medicare HMO | Admitting: Pharmacist Clinician (PhC)/ Clinical Pharmacy Specialist

## 2013-12-03 DIAGNOSIS — I4891 Unspecified atrial fibrillation: Secondary | ICD-10-CM

## 2013-12-03 DIAGNOSIS — Z7901 Long term (current) use of anticoagulants: Secondary | ICD-10-CM

## 2013-12-03 LAB — POCT INR: INR: 3.2

## 2013-12-05 ENCOUNTER — Telehealth: Payer: Self-pay | Admitting: *Deleted

## 2013-12-05 NOTE — Telephone Encounter (Signed)
Signed and faxed Cardiac rehab ll orders

## 2013-12-05 NOTE — Telephone Encounter (Signed)
faxed signed order PT/INR- 11/21/13

## 2013-12-05 NOTE — Telephone Encounter (Signed)
signed pt/inr ordered 11/21/13

## 2013-12-12 ENCOUNTER — Telehealth: Payer: Self-pay | Admitting: Cardiology

## 2013-12-12 NOTE — Telephone Encounter (Signed)
Forward to DR Gevena Barre

## 2013-12-12 NOTE — Telephone Encounter (Signed)
She just wanted to notify you that she was discontinuing service as her Case Management.

## 2013-12-12 NOTE — Telephone Encounter (Signed)
What service?  Leonie Man, MD

## 2013-12-12 NOTE — Telephone Encounter (Signed)
Wants to know if pt have come into the office since hospital visit.

## 2013-12-17 ENCOUNTER — Ambulatory Visit (INDEPENDENT_AMBULATORY_CARE_PROVIDER_SITE_OTHER): Payer: Medicare HMO | Admitting: Pharmacist Clinician (PhC)/ Clinical Pharmacy Specialist

## 2013-12-17 DIAGNOSIS — I4891 Unspecified atrial fibrillation: Secondary | ICD-10-CM

## 2013-12-17 DIAGNOSIS — Z7901 Long term (current) use of anticoagulants: Secondary | ICD-10-CM

## 2013-12-17 LAB — POCT INR: INR: 4.9

## 2013-12-18 ENCOUNTER — Telehealth: Payer: Self-pay | Admitting: *Deleted

## 2013-12-18 NOTE — Telephone Encounter (Signed)
FAXED  PHYSICIAN REFERRAL FORM --SIGNED

## 2013-12-24 ENCOUNTER — Ambulatory Visit (INDEPENDENT_AMBULATORY_CARE_PROVIDER_SITE_OTHER): Payer: Medicare HMO | Admitting: Pharmacist Clinician (PhC)/ Clinical Pharmacy Specialist

## 2013-12-24 DIAGNOSIS — Z7901 Long term (current) use of anticoagulants: Secondary | ICD-10-CM

## 2013-12-24 DIAGNOSIS — I4891 Unspecified atrial fibrillation: Secondary | ICD-10-CM

## 2013-12-24 LAB — POCT INR: INR: 2.8

## 2013-12-26 ENCOUNTER — Telehealth: Payer: Self-pay | Admitting: *Deleted

## 2013-12-26 HISTORY — PX: TRANSTHORACIC ECHOCARDIOGRAM: SHX275

## 2013-12-26 NOTE — Telephone Encounter (Signed)
Late entry  On 12/24/13, re- faxed  [facility]. -SIGNED ON 12/24/13.

## 2014-01-02 ENCOUNTER — Other Ambulatory Visit: Payer: Self-pay | Admitting: Thoracic Surgery (Cardiothoracic Vascular Surgery)

## 2014-01-02 DIAGNOSIS — I4891 Unspecified atrial fibrillation: Secondary | ICD-10-CM

## 2014-01-05 ENCOUNTER — Encounter: Payer: Self-pay | Admitting: Thoracic Surgery (Cardiothoracic Vascular Surgery)

## 2014-01-05 ENCOUNTER — Ambulatory Visit (INDEPENDENT_AMBULATORY_CARE_PROVIDER_SITE_OTHER): Payer: Medicare HMO | Admitting: Cardiology

## 2014-01-05 ENCOUNTER — Ambulatory Visit
Admission: RE | Admit: 2014-01-05 | Discharge: 2014-01-05 | Disposition: A | Discharge status: Home / Self Care | Payer: Medicare HMO | Source: Ambulatory Visit | Attending: Thoracic Surgery (Cardiothoracic Vascular Surgery) | Admitting: Thoracic Surgery (Cardiothoracic Vascular Surgery)

## 2014-01-05 ENCOUNTER — Ambulatory Visit (INDEPENDENT_AMBULATORY_CARE_PROVIDER_SITE_OTHER): Payer: Medicare HMO | Admitting: Pharmacist Clinician (PhC)/ Clinical Pharmacy Specialist

## 2014-01-05 ENCOUNTER — Ambulatory Visit (INDEPENDENT_AMBULATORY_CARE_PROVIDER_SITE_OTHER): Payer: Self-pay | Admitting: Thoracic Surgery (Cardiothoracic Vascular Surgery)

## 2014-01-05 ENCOUNTER — Encounter: Payer: Medicare HMO | Admitting: Physical Medicine & Rehabilitation

## 2014-01-05 VITALS — BP 120/66 | HR 73 | Ht 62.0 in | Wt 238.6 lb

## 2014-01-05 VITALS — BP 104/60 | HR 70 | Resp 20 | Ht 62.0 in | Wt 238.0 lb

## 2014-01-05 DIAGNOSIS — I4891 Unspecified atrial fibrillation: Secondary | ICD-10-CM

## 2014-01-05 DIAGNOSIS — I255 Ischemic cardiomyopathy: Secondary | ICD-10-CM

## 2014-01-05 DIAGNOSIS — Z951 Presence of aortocoronary bypass graft: Secondary | ICD-10-CM

## 2014-01-05 DIAGNOSIS — I2589 Other forms of chronic ischemic heart disease: Secondary | ICD-10-CM

## 2014-01-05 DIAGNOSIS — I2510 Atherosclerotic heart disease of native coronary artery without angina pectoris: Secondary | ICD-10-CM

## 2014-01-05 DIAGNOSIS — Z79899 Other long term (current) drug therapy: Secondary | ICD-10-CM

## 2014-01-05 DIAGNOSIS — Z7901 Long term (current) use of anticoagulants: Secondary | ICD-10-CM

## 2014-01-05 DIAGNOSIS — I447 Left bundle-branch block, unspecified: Secondary | ICD-10-CM

## 2014-01-05 DIAGNOSIS — Z5181 Encounter for therapeutic drug level monitoring: Secondary | ICD-10-CM

## 2014-01-05 DIAGNOSIS — R0602 Shortness of breath: Secondary | ICD-10-CM

## 2014-01-05 LAB — POCT INR: INR: 2.4

## 2014-01-05 NOTE — Progress Notes (Signed)
[address]       [place] [address]             [facility] OFFICE NOTE  Referring Provider is Leonie Man, MD PCP is Marton Redwood, MD   HPI:   Patient returns for routine followup status post coronary artery bypass grafting x3 with clipping of LA appendage on 10/23/2013.  She was last seen here in the office on 11/24/2013. Since then she has continued to do quite well. She was seen in followup earlier today by Dr. Ellyn Hack who has scheduled her for followup echocardiogram. She returns to the office with her son today. She reports that she continues to do well. She still gets short of breath with exertion but overall her exercise tolerance has gradually improved. She hopes to start the outpatient cardiac rehabilitation program. She has never had any chest pain or chest discomfort with exertion. She has mild residual soreness in her chest related to her sternotomy, but this does not bother her much at all at this point in time. She has not had any dizzy spells or tachypalpitations.  Current Outpatient Prescriptions  Medication Sig Dispense Refill  . allopurinol (ZYLOPRIM) 300 MG tablet Take 300 mg by mouth daily.      [name] amiodarone (PACERONE) 200 MG tablet Take 1 tablet (200 mg total) by mouth daily.  30 tablet  1  . aspirin EC 81 MG tablet Take 1 tablet (81 mg total) by mouth daily.      . carvedilol (COREG) 6.25 MG tablet Take 1 tablet (6.25 mg total) by mouth 2 (two) times daily with a meal.  60 tablet  1  . ferrous sulfate 325 (65 FE) MG tablet Take 1 tablet (325 mg total) by mouth daily with breakfast.  30 tablet  3  . furosemide (LASIX) 40 MG tablet Take 40 mg by mouth daily.       [name] levothyroxine (SYNTHROID, LEVOTHROID) 112 MCG tablet Take 200 mcg by mouth daily before breakfast.      . lisinopril (PRINIVIL,ZESTRIL) 2.5 MG tablet Take 1 tablet (2.5 mg total) by mouth daily.  30 tablet  1  . oxyCODONE (OXY IR/ROXICODONE) 5 MG  immediate release tablet Take 1-2 tablets (5-10 mg total) by mouth every 3 (three) hours as needed for moderate pain.  60 tablet  0  . pantoprazole (PROTONIX) 40 MG tablet Take 1 tablet (40 mg total) by mouth daily.  30 tablet  1  . potassium chloride SA (K-DUR,KLOR-CON) 20 MEQ tablet Take 1 tablet (20 mEq total) by mouth daily.  30 tablet  1  . prednisoLONE acetate (PRED FORTE) 1 % ophthalmic suspension Place 1 drop into the right eye daily.      . rosuvastatin (CRESTOR) 20 MG tablet Take 10 mg by mouth daily.      . sertraline (ZOLOFT) 50 MG tablet Take 1 tablet by mouth daily.      [name] warfarin (COUMADIN) 2.5 MG tablet Take 1 tablet (2.5 mg total) by mouth daily at 6 PM.  30 tablet  1   No current facility-administered medications for this visit.      Physical Exam:   BP 104/60  Pulse 70  Resp 20  Ht 5\' 2"  (1.575 m)  Wt 238 lb (107.956 kg)  BMI 43.52 kg/m2  SpO2 96%  General:  Obese but well-appearing  Chest:   Clear  CV:   Regular rate and rhythm  without murmur  Incisions:  Sternotomy has healed nicely and the sternum is stable  Abdomen:  Soft  Extremities:  Warm and well-perfused  Diagnostic Tests:  CHEST 2 VIEW  COMPARISON: DG CHEST 2 VIEW dated 11/24/2013  FINDINGS:  The lungs are well-expanded. The left-sided pleural effusion has  nearly totally resolved. There is a minimal blunting of the  costophrenic angles bilaterally. The cardiopericardial silhouette is  normal in size. The pulmonary vascularity is not engorged. There are  7 intact sternal wires present. An atrial appendage clip and 2  coronary artery markers are present. The observed portions of the  bony thorax exhibit no acute abnormalities.  IMPRESSION:  There has been interval improvement in the appearance of the chest  with near total resolution of the left pleural effusion. Trace  amounts of pleural fluid may be present bilaterally. Borderline  hyperinflation of the lungs may reflect underlying COPD or  reactive  airway disease.  Electronically Signed  By: David Martinique  On: 01/05/2014 13:19    Impression:  Patient is doing very well now close to 3 months status post coronary artery bypass grafting. She has underlying moderate to severe ischemic cardiomyopathy and history of atrial fibrillation.  Plan:  I've encouraged patient to continue to gradually increase her physical activity as tolerated without any particular limitations at this time. I think she may resume driving an automobile and I've encouraged her to get started in the outpatient cardiac rehabilitation program. We have not made any changes to her current medications. She will return for routine followup next February.   Valentina Gu. Roxy Manns, MD 01/05/2014 1:55 PM

## 2014-01-05 NOTE — Patient Instructions (Addendum)
Your physician recommends that you schedule a follow-up appointment in: 3 Months  Your physician has requested that you have an echocardiogram. Echocardiography is a painless test that uses sound waves to create images of your heart. It provides your doctor with information about the size and shape of your heart and how well your heart's chambers and valves are working. This procedure takes approximately one hour. There are no restrictions for this procedure.  Your physician has recommended that you have a pulmonary function test. Pulmonary Function Tests are a group of tests that measure how well air moves in and out of your lungs. Zacarias Pontes

## 2014-01-05 NOTE — Patient Instructions (Signed)
The patient should continue to avoid any heavy lifting or strenuous use of arms or shoulders for at least a total of three months from the time of surgery.  The patient may return to driving an automobile as long as they are no longer requiring oral narcotic pain relievers during the daytime.  It would be wise to start driving only short distances during the daylight and gradually increase from there as they feel comfortable.  The patient is encouraged to enroll and participate in the outpatient cardiac rehab program beginning as soon as practical.  

## 2014-01-07 ENCOUNTER — Other Ambulatory Visit (HOSPITAL_COMMUNITY): Payer: Self-pay | Admitting: Physician Assistant

## 2014-01-07 ENCOUNTER — Encounter: Payer: Self-pay | Admitting: Cardiology

## 2014-01-07 DIAGNOSIS — R0602 Shortness of breath: Secondary | ICD-10-CM | POA: Insufficient documentation

## 2014-01-07 DIAGNOSIS — Z5181 Encounter for therapeutic drug level monitoring: Secondary | ICD-10-CM | POA: Insufficient documentation

## 2014-01-07 DIAGNOSIS — Z79899 Other long term (current) drug therapy: Secondary | ICD-10-CM | POA: Insufficient documentation

## 2014-01-07 NOTE — Assessment & Plan Note (Signed)
This was in the setting of A. Fib RVR and MI.  Currently he still has had some conduction delay on her EKG. If she would require ICD would consider by the ICD placement by Electrophysiology.

## 2014-01-07 NOTE — Assessment & Plan Note (Signed)
She is having her LFTs monitor with her lipids as well as her TSH by her PCP. To round out her amiodarone monitoring we also did check a baseline set of PFTs

## 2014-01-07 NOTE — Assessment & Plan Note (Signed)
She does have exertional dyspnea which is not unexpected following a significant infarct. Probably also related to some signs of deconditioning as well.

## 2014-01-07 NOTE — Assessment & Plan Note (Signed)
She did undergo CABG. Is on stable regimen.

## 2014-01-07 NOTE — Assessment & Plan Note (Signed)
We talked about importance of dietary modification. Hopefully as she continues to feel stronger she'll be started doing cardiac rehabilitation. My understanding is that she will be doing the maintenance program of cardiac rehabilitation

## 2014-01-07 NOTE — Progress Notes (Signed)
PATIENT: Elaine [name] MRN: [id number]  DOB: Oct 11, 1940   DOV:01/07/2014 PCP: Marton Redwood, MD  Clinic Note: Chief Complaint  Patient presents with  . Follow-up    4-5 weeks per laura, soarness in chest but no pain    HPI: Elaine [name] is a 73 y.o.  female with a PMH below who presents today for her second post CABG followup. She saw Cecilie Kicks, NP-C. In the end of March as her initial visit. She is a very pleasant woman with a past medical history of hypertension, diabetes mellitus type 2 and former but tobacco abuse who was admitted on 10/21/2011 with an acute MI -- she actually presented in A. Fib with RVR with a left bundle branch block. Her symptoms of profound chest pressure heaviness and edema with rest or distress was concerning enough to call code STEMI. I took her to the cardiac catheterization lab where she was found to have moderate left main disease with high-grade proximal stenosis in the LAD with essentially 3 vessel disease. She did rule in for an MI and was found to be in significant acute systolic/diastolicheart failure related to both her A. Fib and MI. After initial stabilization on amiodarone and diuresis, she was taken to the OR by Dr. Roxy Manns on the 26th of February for her 3 vessel CABG and clipping of left atrial appendage. She was discharged on warfarin for PAF. She went for a short-term to inpatient rehabilitation and was discharged in the end of March. She did have diabetes counseling. She had lower STEMI he did with diuretic.she is also counseled on low-salt diet. She is known to have ischemic cardiomyopathy with EF of 20-25% in the setting of her acute MI and A. Fib.  Interval History: since her last visit with Mickel Baas, she's been doing fairly well.  She notes 13 has significantly improved. Simply her right leg at the lower scar from the saphenous vein resection site is still somewhat sore. She has severe tendinitis in her left ankle that has kept her from being of the 2  much more rehabilitation since her return home. She has soreness from the sternotomy incision, but denies any of her admission  symptoms of profound dyspnea and chest pressure either rest or exertion. She denies any recurrence of palpitations or rapid heart beats to suggest recurrence of A. Fib. She was discharged on amiodarone which is now on a maintenance dose. She denies any significant PND, orthopnea or edema. She has intermittent swelling but is much improved. She sleeps chronically on 2 pillows. No rapid or irregular heartbeats that she can tell.  She denies anyTIA/amaurosis fugax or syncope/near syncope symptoms. She was discharged on warfarin, and denies any melena, hematochezia, hematuria or epistaxis. Besides the pain in her right leg at the graft excision site and the tendinitis in left ankle, she denies any claudication type symptoms. No wheezing or extensive coughing.    Past Medical History  Diagnosis Date  . Arthritis   . Heart murmur   . Hyperlipidemia   . Hypertension   . Hypothyroidism   . Ischemic cardiomyopathy 10/20/2013    Echo 2/23: EF 20-25%; mild LVH. anteroseptal Akinesis; mid-apical anterior, inferior & inferoseptal akinesis; apical-lateral akinesis; apical akinesis; Grade 1 D Dysfxn.; Mild-Mod LA dilatoin; mild MR; Mod PHTN  . Acute pulmonary edema with congestive heart failure 10/20/2013    Resolved  . Left bundle branch block (LBBB) on electrocardiogram   . Atrial fibrillation 10/20/2013  . OSTEOARTHRITIS 08/06/2006  . Morbid  obesity   . Diabetic neuropathy   . Type II diabetes mellitus with complication [id number]  . Chronic kidney disease   . S/P CABG x 3 10/23/2013    LIMA to LAD, SVG to OM2, SVG to RPLB, EVH via right thigh  . Acute myocardial infarction of anterior wall 10/20/2013    Afib, LBBB  . CAD, multiple vessel 10/20/2013    LM, LAD & RCA --> CABG    Prior Cardiac Evaluation and Past Surgical History: Past Surgical History  Procedure Laterality  Date  . Corneal transplant  2011    right eye  . Total hip arthroplasty  2010, 2011    right 2010, left 2011  . Abdominal hysterectomy    . Coronary artery bypass graft N/A 10/23/2013    Procedure: CORONARY ARTERY BYPASS GRAFTING (CABG);  Surgeon: Rexene Alberts, MD;  Location: [facility];  Service: Open Heart Surgery;  Laterality: N/A;  . Intraoperative transesophageal echocardiogram N/A 10/23/2013    Procedure: INTRAOPERATIVE TRANSESOPHAGEAL ECHOCARDIOGRAM;  Surgeon: Rexene Alberts, MD;  Location: [facility];  Service: Open Heart Surgery;  Laterality: N/A;  . Clipping of atrial appendage N/A 10/23/2013    Procedure: CLIPPING OF ATRIAL APPENDAGE;  Surgeon: Rexene Alberts, MD;  Location: [facility];  Service: Open Heart Surgery;  Laterality: N/A;  . Cardiac catheterization  10/20/2013    Distal LM ~70%, LAD - ostial 70%, prox 80, 95 & 95%; RCA prox 70%, mid 80%; minimal Cx disease  . Coronary artery bypass graft  10/23/2013    LIMA-LAD, SVG-OM2, SVG-RPL    Allergies  Allergen Reactions  . Tape Rash    Adhesive tape    Current Outpatient Prescriptions  Medication Sig Dispense Refill  . allopurinol (ZYLOPRIM) 300 MG tablet Take 300 mg by mouth daily.      [name] amiodarone (PACERONE) 200 MG tablet Take 1 tablet (200 mg total) by mouth daily.  30 tablet  1  . aspirin EC 81 MG tablet Take 1 tablet (81 mg total) by mouth daily.      . carvedilol (COREG) 6.25 MG tablet Take 1 tablet (6.25 mg total) by mouth 2 (two) times daily with a meal.  60 tablet  1  . ferrous sulfate 325 (65 FE) MG tablet Take 1 tablet (325 mg total) by mouth daily with breakfast.  30 tablet  3  . furosemide (LASIX) 40 MG tablet Take 40 mg by mouth daily.       [name] lisinopril (PRINIVIL,ZESTRIL) 2.5 MG tablet Take 1 tablet (2.5 mg total) by mouth daily.  30 tablet  1  . oxyCODONE (OXY IR/ROXICODONE) 5 MG immediate release tablet Take 1-2 tablets (5-10 mg total) by mouth every 3 (three) hours as needed for moderate pain.  60 tablet  0  .  pantoprazole (PROTONIX) 40 MG tablet Take 1 tablet (40 mg total) by mouth daily.  30 tablet  1  . potassium chloride SA (K-DUR,KLOR-CON) 20 MEQ tablet Take 1 tablet (20 mEq total) by mouth daily.  30 tablet  1  . prednisoLONE acetate (PRED FORTE) 1 % ophthalmic suspension Place 1 drop into the right eye daily.      . rosuvastatin (CRESTOR) 20 MG tablet Take 10 mg by mouth daily.      . sertraline (ZOLOFT) 50 MG tablet Take 1 tablet by mouth daily.      [name] warfarin (COUMADIN) 2.5 MG tablet Take 1 tablet (2.5 mg total) by mouth daily at 6 PM.  30 tablet  1  . levothyroxine (SYNTHROID, LEVOTHROID) 112 MCG tablet Take 200 mcg by mouth daily before breakfast.       No current facility-administered medications for this visit.    History   Social History Narrative   Lives with husband and son in [place].  She is currently retired.   Former smoker quit in 1995.   ROS: A comprehensive Review of Systems - Negative except symptoms were noted above. Otherwise essentially negative. No notable GI or GU symptoms. No pulmonary symptoms at rest or distress, coughing or wheezing.  PHYSICAL EXAM BP 120/66  Pulse 73  Ht 5\' 2"  (1.575 m)  Wt 238 lb 9.6 oz (108.228 kg)  BMI 43.63 kg/m2 General appearance: alert, cooperative, appears stated age, no distress, morbidly obese and pleasant mood and affect.  Answers questions appropriately HEENT: [place]/AT, MMM, anicteric sclera; the right eye appears somewhat collapsed over. She has had a corneal transplant and has mild lateral drift with reduced abduction Neck: no adenopathy, no carotid bruit, no JVD and supple, symmetrical, trachea midline Lungs: clear to auscultation bilaterally, normal percussion bilaterally and nonlabored, good air movement Heart: regular rate and rhythm, S1, S2 normal, no murmur, click, rub or gallop and normal apical impulse Abdomen: soft, non-tender; bowel sounds normal; no masses,  no organomegaly Extremities: no ulcers, gangrene or  trophic changes and roughly EE 1+ bilateral lower extremity edema. Pulses: 2+ and symmetric Skin: Skin color, texture, turgor normal. No rashes or lesions Neurologic: Grossly normal   Adult ECG Report - not checked  Recent Labs: none since discharge  ASSESSMENT / PLAN: Ischemic cardiomyopathy: EF by Echo 20-25%; LAD distribution "akinesis" (post MI) Initial echocardiogram and LV gram were relatively significant for his low EF. She had an significant anterior wall motion abnormality as well as inferior wall motion abnormality. We can survey hope that she would have improvement of her EF after CABG and medical therapy. She was not discharged on a LifeVest.   Will need to ensure that she has improvement area to roughly 40%, otherwise I would consider referring her to my colleagues for possible ICD placement.  Plan: Recheck 2-D echocardiogram in roughly 2 months.  For now continue current dose of carvedilol, ACE inhibitor, standing Lasix with potassium.  Consider spironolactone at followup visit based on what the echo shows.  Continue to try and control of A. Fib.  Continue statin plus aspirin  Atrial fibrillation She has not had any further A. Fib, she did go undergo clipping of the atrial appendage and is on warfarin. Continues to be on amiodarone for rhythm control. As long as her EF is low continue the amiodarone.  Left main coronary artery disease She did undergo CABG. Is on stable regimen.  Left bundle branch block (LBBB) on electrocardiogram This was in the setting of A. Fib RVR and MI.  Currently he still has had some conduction delay on her EKG. If she would require ICD would consider by the ICD placement by Electrophysiology.  Encounter for monitoring amiodarone therapy She is having her LFTs monitor with her lipids as well as her TSH by her PCP. To round out her amiodarone monitoring we also did check a baseline set of PFTs  SOB (shortness of breath) She does have  exertional dyspnea which is not unexpected following a significant infarct. Probably also related to some signs of deconditioning as well.  Morbid obesity We talked about importance of dietary modification. Hopefully as she continues to feel stronger she'll be started doing cardiac rehabilitation.  My understanding is that she will be doing the maintenance program of cardiac rehabilitation   She is looking forward to being given clearance to drive. She is due to see Dr. Roxy Manns soon.  Orders Placed This Encounter  Procedures  . 2D Echocardiogram without contrast  . Pulmonary function test   Meds ordered this encounter  Medications  . sertraline (ZOLOFT) 50 MG tablet    Sig: Take 1 tablet by mouth daily.  . furosemide (LASIX) 40 MG tablet    Sig: Take 40 mg by mouth daily.     Followup: 3 months  DAVID W. Ellyn Hack, M.D., M.S. Interventional Cardiology [facility]

## 2014-01-07 NOTE — Assessment & Plan Note (Addendum)
Initial echocardiogram and LV gram were relatively significant for his low EF. She had an significant anterior wall motion abnormality as well as inferior wall motion abnormality. We can survey hope that she would have improvement of her EF after CABG and medical therapy. She was not discharged on a LifeVest.   Will need to ensure that she has improvement area to roughly 40%, otherwise I would consider referring her to my colleagues for possible ICD placement.  Plan: Recheck 2-D echocardiogram in roughly 2 months.  For now continue current dose of carvedilol, ACE inhibitor, standing Lasix with potassium.  Consider spironolactone at followup visit based on what the echo shows.  Continue to try and control of A. Fib.  Continue statin plus aspirin

## 2014-01-07 NOTE — Assessment & Plan Note (Signed)
She has not had any further A. Fib, she did go undergo clipping of the atrial appendage and is on warfarin. Continues to be on amiodarone for rhythm control. As long as her EF is low continue the amiodarone.

## 2014-01-09 ENCOUNTER — Telehealth: Payer: Self-pay | Admitting: Cardiology

## 2014-01-09 ENCOUNTER — Other Ambulatory Visit: Payer: Self-pay | Admitting: *Deleted

## 2014-01-09 MED ORDER — FUROSEMIDE 40 MG PO TABS: 40.0000 mg | ORAL_TABLET | Freq: Every day | ORAL | Status: DC

## 2014-01-09 MED ORDER — CARVEDILOL 6.25 MG PO TABS: 6.2500 mg | ORAL_TABLET | Freq: Two times a day (BID) | ORAL | Status: DC

## 2014-01-09 MED ORDER — WARFARIN SODIUM 2.5 MG PO TABS: 2.5000 mg | ORAL_TABLET | Freq: Every day | ORAL | Status: DC

## 2014-01-09 MED ORDER — POTASSIUM CHLORIDE CRYS ER 20 MEQ PO TBCR: 20.0000 meq | EXTENDED_RELEASE_TABLET | Freq: Every day | ORAL | Status: DC

## 2014-01-09 MED ORDER — AMIODARONE HCL 200 MG PO TABS: 200.0000 mg | ORAL_TABLET | Freq: Every day | ORAL | Status: DC

## 2014-01-09 NOTE — Telephone Encounter (Signed)
Still have not heard from her refills. Please call her Carvedilol 6.25 mg,Warfarin 2 mg,Furosemide 40 mg,Amiodarone 200 mg,and Klor-Con M 20ER 20 MEQ.Please call to [phone].

## 2014-01-09 NOTE — Telephone Encounter (Signed)
Rx refills sent to patient pharmacy, called and informed patient.

## 2014-01-12 ENCOUNTER — Ambulatory Visit (HOSPITAL_COMMUNITY)
Admission: RE | Admit: 2014-01-12 | Discharge: 2014-01-12 | Disposition: A | Discharge status: Home / Self Care | Payer: Medicare HMO | Source: Ambulatory Visit | Attending: Cardiology | Admitting: Cardiology

## 2014-01-12 DIAGNOSIS — I255 Ischemic cardiomyopathy: Secondary | ICD-10-CM

## 2014-01-12 DIAGNOSIS — I2589 Other forms of chronic ischemic heart disease: Secondary | ICD-10-CM | POA: Insufficient documentation

## 2014-01-12 DIAGNOSIS — Z79899 Other long term (current) drug therapy: Secondary | ICD-10-CM

## 2014-01-12 DIAGNOSIS — Z5181 Encounter for therapeutic drug level monitoring: Secondary | ICD-10-CM | POA: Insufficient documentation

## 2014-01-12 DIAGNOSIS — R0602 Shortness of breath: Secondary | ICD-10-CM

## 2014-01-12 MED ORDER — ALBUTEROL SULFATE (2.5 MG/3ML) 0.083% IN NEBU
2.5000 mg | INHALATION_SOLUTION | Freq: Once | RESPIRATORY_TRACT | Status: AC
  Administered 2014-01-12: 2.5 mg via RESPIRATORY_TRACT

## 2014-01-13 ENCOUNTER — Telehealth: Payer: Self-pay | Admitting: *Deleted

## 2014-01-13 ENCOUNTER — Ambulatory Visit (HOSPITAL_COMMUNITY)
Admission: RE | Admit: 2014-01-13 | Discharge: 2014-01-13 | Disposition: A | Discharge status: Home / Self Care | Payer: Medicare HMO | Source: Ambulatory Visit | Attending: Internal Medicine | Admitting: Internal Medicine

## 2014-01-13 DIAGNOSIS — R0602 Shortness of breath: Secondary | ICD-10-CM

## 2014-01-13 DIAGNOSIS — I255 Ischemic cardiomyopathy: Secondary | ICD-10-CM

## 2014-01-13 DIAGNOSIS — I2589 Other forms of chronic ischemic heart disease: Secondary | ICD-10-CM | POA: Insufficient documentation

## 2014-01-13 DIAGNOSIS — I379 Nonrheumatic pulmonary valve disorder, unspecified: Secondary | ICD-10-CM

## 2014-01-13 DIAGNOSIS — Z5181 Encounter for therapeutic drug level monitoring: Secondary | ICD-10-CM

## 2014-01-13 DIAGNOSIS — Z79899 Other long term (current) drug therapy: Secondary | ICD-10-CM

## 2014-01-13 NOTE — Telephone Encounter (Signed)
Left message to call back concerning PFT RESULTS

## 2014-01-13 NOTE — Telephone Encounter (Signed)
Spoke to patient. Result given . Verbalized understanding Keep appt 8/ 17/2015

## 2014-01-13 NOTE — Progress Notes (Signed)
2D Echo Performed 01/13/2014    Marygrace Drought, RCS

## 2014-01-13 NOTE — Telephone Encounter (Signed)
Message copied by Raiford Simmonds on Tue Jan 13, 2014  8:05 AM ------      Message from: Leonie Man      Created: Mon Jan 12, 2014 10:34 PM       Minimal evidence of ~COPD like disease.  There is suggestion of mild disease in the air sacs (alveoli).      This is our baseline report -- we use this to determine if there are changes in the future that will make [place] reconsider Amiodarone.            Leonie Man, MD       ------

## 2014-01-14 ENCOUNTER — Other Ambulatory Visit: Payer: Self-pay | Admitting: *Deleted

## 2014-01-14 MED ORDER — PANTOPRAZOLE SODIUM 40 MG PO TBEC: 40.0000 mg | DELAYED_RELEASE_TABLET | Freq: Every day | ORAL | Status: DC

## 2014-01-15 LAB — PULMONARY FUNCTION TEST
DL/VA % PRED: 78 %
DL/VA: 3.56 ml/min/mmHg/L
DLCO UNC: 13.17 ml/min/mmHg
DLCO unc % pred: 61 %
FEF 25-75 PRE: 0.94 L/s
FEF 25-75 Post: 1.4 L/sec
FEF2575-%Change-Post: 48 %
FEF2575-%PRED-PRE: 65 %
FEF2575-%Pred-Post: 97 %
FEV1-%CHANGE-POST: 11 %
FEV1-%Pred-Post: 101 %
FEV1-%Pred-Pre: 91 %
FEV1-Post: 1.59 L
FEV1-Pre: 1.43 L
FEV1FVC-%Change-Post: 6 %
FEV1FVC-%Pred-Pre: 91 %
FEV6-%CHANGE-POST: 9 %
FEV6-%PRED-PRE: 99 %
FEV6-%Pred-Post: 108 %
FEV6-POST: 2.13 L
FEV6-PRE: 1.95 L
FEV6FVC-%Change-Post: 4 %
FEV6FVC-%PRED-POST: 104 %
FEV6FVC-%PRED-PRE: 99 %
FVC-%Change-Post: 4 %
FVC-%PRED-POST: 104 %
FVC-%PRED-PRE: 99 %
FVC-POST: 2.13 L
FVC-Pre: 2.04 L
Post FEV1/FVC ratio: 75 %
Post FEV6/FVC ratio: 100 %
Pre FEV1/FVC ratio: 70 %
Pre FEV6/FVC Ratio: 96 %
RV % pred: 110 %
RV: 2.39 L
TLC % pred: 95 %
TLC: 4.54 L

## 2014-01-20 ENCOUNTER — Telehealth: Payer: Self-pay | Admitting: Cardiology

## 2014-01-20 NOTE — Telephone Encounter (Signed)
Thanks, OK to stop Amio. Leonie Man, MD

## 2014-01-20 NOTE — Telephone Encounter (Signed)
Pt states she has bumps from her head  To her feet,itching so bad, Real SOB when she walks,coughing. She thinks it is coming from her medicine,Amiodarone.

## 2014-01-20 NOTE — Telephone Encounter (Signed)
RN spoke to patient . Information givevn to hold amiodarone per Dr  Gwenlyn Found, the next available appointment will be 02/03/14 10:15 am.  May use benadryl for icthing, if become worse follow up with PCP. Patient verbalized understanding.

## 2014-01-20 NOTE — Telephone Encounter (Signed)
The patient's coronary bypass grafting operation was 3 months ago. She was placed on amiodarone because of A. Fib. While it is entirely possible that she has allergic reaction the biological half-life of amiodarone his months. I will arrange for her to come back and see Dr. Ellyn Hack next office visit.. In the meantime she can stop her amiodarone.

## 2014-01-20 NOTE — Telephone Encounter (Signed)
RN  spoke to patient. She states she is miserable. She developed bumps -,itching  Last 2 days  From head to toe.  She states her ears stated swelling  About 4 days ago. Patient states she did not think anything about it until she develop th bumps. She states she has every symptom on the Amiodarone profile. She also states she has a cough - and coughing up creamy white mucous. Patient states she has been using cortisone cream. This morning patient states she is at [facility] with patient's son in law. She will be there today and tomorrow in the morning.    RN asked if patient eat anything different , or used any body lotion ,soap, or detergent. She states no.SHE REQUEST AND AFTERNOON APPOINTMENT. Will defer to the D.O.D--- Dr Gwenlyn Found and contact her back.

## 2014-01-21 ENCOUNTER — Telehealth: Payer: Self-pay | Admitting: Cardiology

## 2014-01-21 ENCOUNTER — Telehealth: Payer: Self-pay | Admitting: *Deleted

## 2014-01-21 NOTE — Telephone Encounter (Signed)
Message copied by Raiford Simmonds on Wed Jan 21, 2014 10:05 AM ------      Message from: Leonie Man      Created: Wed Jan 14, 2014  3:53 PM       Great News -- pump function is notably better.  Still ~moderately reduced, but much better.  Above the range where we consider Defibrillator.  As expected the heart attack did effect the very tip of the heart, but the rest of that area seems to have recovered.            Leonie Man, MD       ------

## 2014-01-21 NOTE — Telephone Encounter (Signed)
Patient called to inform me that she is having symptoms of coughing up thick white mucous with some SOB. Wanted to know if she can take mucinex. recommended to patient to contact PCP for evaluation to r/o any URI issues. He Elaine [name] can recommend course of treatment based on their evaluation. Patient agreed with plan.

## 2014-01-21 NOTE — Telephone Encounter (Signed)
Spoke to patient. echo Result given . Verbalized understanding

## 2014-01-21 NOTE — Telephone Encounter (Signed)
Pt is coughing so much,can she take Mucinex?

## 2014-01-23 ENCOUNTER — Encounter: Payer: Self-pay | Admitting: Physical Medicine & Rehabilitation

## 2014-01-23 ENCOUNTER — Encounter: Payer: Medicare HMO | Attending: Physical Medicine & Rehabilitation | Admitting: Physical Medicine & Rehabilitation

## 2014-01-23 DIAGNOSIS — R21 Rash and other nonspecific skin eruption: Secondary | ICD-10-CM | POA: Insufficient documentation

## 2014-01-23 DIAGNOSIS — Z951 Presence of aortocoronary bypass graft: Secondary | ICD-10-CM | POA: Insufficient documentation

## 2014-01-23 DIAGNOSIS — I4891 Unspecified atrial fibrillation: Secondary | ICD-10-CM | POA: Insufficient documentation

## 2014-01-23 DIAGNOSIS — I10 Essential (primary) hypertension: Secondary | ICD-10-CM | POA: Insufficient documentation

## 2014-01-23 DIAGNOSIS — I509 Heart failure, unspecified: Secondary | ICD-10-CM | POA: Insufficient documentation

## 2014-01-23 DIAGNOSIS — R5381 Other malaise: Secondary | ICD-10-CM | POA: Insufficient documentation

## 2014-01-23 DIAGNOSIS — E1140 Type 2 diabetes mellitus with diabetic neuropathy, unspecified: Secondary | ICD-10-CM

## 2014-01-23 DIAGNOSIS — M1990 Unspecified osteoarthritis, unspecified site: Secondary | ICD-10-CM

## 2014-01-23 DIAGNOSIS — E1142 Type 2 diabetes mellitus with diabetic polyneuropathy: Secondary | ICD-10-CM | POA: Insufficient documentation

## 2014-01-23 DIAGNOSIS — E1149 Type 2 diabetes mellitus with other diabetic neurological complication: Secondary | ICD-10-CM | POA: Insufficient documentation

## 2014-01-23 DIAGNOSIS — E118 Type 2 diabetes mellitus with unspecified complications: Secondary | ICD-10-CM

## 2014-01-23 NOTE — Progress Notes (Signed)
Subjective:    Patient ID: Elaine [name], female    DOB: August 22, 1941, 73 y.o.   MRN: [id number]  HPI   Elaine [name] is back regarding her gait issues.  She was discharged from inpatient rehab earlier this spring. She has gradually improved her stamina. She is now walking with a cane. She is getting short winded at times when ambulating however. She has been enrolled in cardiac rehab.   She developed a rash which appeared to be from allopurinol. She has been trying to exercise and watch what she eats. Her pcp has been adjusting her lasix dose.   Her right 5th finger has been painful at times. She has a trigger finger which tends to act up. Her left ankle is problematic as well.      Pain Inventory Average Pain 0 Pain Right Now 0 My pain is no pain  In the last 24 hours, has pain interfered with the following? General activity 0 Relation with others 0 Enjoyment of life 0 What TIME of day is your pain at its worst? no pain Sleep (in general) Good  Pain is worse with: no pain Pain improves with: no pain Relief from Meds: no pain  Mobility walk with assistance use a cane ability to climb steps?  yes do you drive?  yes  Function retired  Neuro/Psych bladder control problems weakness  Prior Studies Any changes since last visit?  no  Physicians involved in your care Any changes since last visit?  no   Family History  Problem Relation Age of Onset  . Colon cancer Neg Hx   . Esophageal cancer Neg Hx   . Rectal cancer Neg Hx   . Stomach cancer Neg Hx    History   Social History  . Marital Status: Married    Spouse Name: N/A    Number of Children: N/A  . Years of Education: N/A   Occupational History  . retired    Social History Main Topics  . Smoking status: Former Smoker    Quit date: 08/28/1993  . Smokeless tobacco: Never Used  . Alcohol Use: No  . Drug Use: No  . Sexual Activity: None   Other Topics Concern  . None   Social History Narrative   Lives with husband and son in [place].  She is currently retired.   Former smoker quit in 1995.   Past Surgical History  Procedure Laterality Date  . Corneal transplant  2011    right eye  . Total hip arthroplasty  2010, 2011    right 2010, left 2011  . Abdominal hysterectomy    . Coronary artery bypass graft N/A 10/23/2013    Procedure: CORONARY ARTERY BYPASS GRAFTING (CABG);  Surgeon: Rexene Alberts, MD;  Location: [facility];  Service: Open Heart Surgery;  Laterality: N/A;  . Intraoperative transesophageal echocardiogram N/A 10/23/2013    Procedure: INTRAOPERATIVE TRANSESOPHAGEAL ECHOCARDIOGRAM;  Surgeon: Rexene Alberts, MD;  Location: [facility];  Service: Open Heart Surgery;  Laterality: N/A;  . Clipping of atrial appendage N/A 10/23/2013    Procedure: CLIPPING OF ATRIAL APPENDAGE;  Surgeon: Rexene Alberts, MD;  Location: [facility];  Service: Open Heart Surgery;  Laterality: N/A;  . Cardiac catheterization  10/20/2013    Distal LM ~70%, LAD - ostial 70%, prox 80, 95 & 95%; RCA prox 70%, mid 80%; minimal Cx disease  . Coronary artery bypass graft  10/23/2013    LIMA-LAD, SVG-OM2, SVG-RPL   Past Medical History  Diagnosis Date  . Arthritis   . Heart murmur   . Hyperlipidemia   . Hypertension   . Hypothyroidism   . Ischemic cardiomyopathy 10/20/2013    Echo 2/23: EF 20-25%; mild LVH. anteroseptal Akinesis; mid-apical anterior, inferior & inferoseptal akinesis; apical-lateral akinesis; apical akinesis; Grade 1 D Dysfxn.; Mild-Mod LA dilatoin; mild MR; Mod PHTN  . Acute pulmonary edema with congestive heart failure 10/20/2013    Resolved  . Left bundle branch block (LBBB) on electrocardiogram   . Atrial fibrillation 10/20/2013  . OSTEOARTHRITIS 08/06/2006  . Morbid obesity   . Diabetic neuropathy   . Type II diabetes mellitus with complication XX123456  . Chronic kidney disease   . S/P CABG x 3 10/23/2013    LIMA to LAD, SVG to OM2, SVG to RPLB, EVH via right thigh  . Acute myocardial  infarction of anterior wall 10/20/2013    Afib, LBBB  . CAD, multiple vessel 10/20/2013    LM, LAD & RCA --> CABG   There were no vitals taken for this visit.  Opioid Risk Score:   Fall Risk Score:  (educated and handout given for fall prevention in the home) Review of Systems  Respiratory: Positive for shortness of breath.   Cardiovascular: Positive for leg swelling.  Genitourinary:       Bladder control problems  Neurological: Positive for weakness.  All other systems reviewed and are negative.      Objective:   Physical Exam Constitutional: She is oriented to person, place, and time. No distress.  obese --weight HENT: only a few remaining teeth. Oral mucosa pink and moist  Head: Normocephalic and atraumatic.  Eyes: EOM are normal.  Right eye with cataract  Neck: Normal range of motion. Neck supple. No JVD present. No tracheal deviation present. No thyromegaly present.  Cardiovascular: Normal rate.  Cardiac rate controlled. No murmurs, rubs, or gallops  Respiratory:  BS clear. No wheezes, rales, or rhonchi. Gets winded with 70ft of ambulation GI: Soft. Bowel sounds are normal. She exhibits no distension.  Obese  Neurological: She is alert and oriented to person, place, and time.  UE 4/5 prox to distally bilaterally .BLE 4 /5 HF, 5/5 KE and ADF/PF   Skin:  midline chest incision healed. Diffuse maculopapular rash Psychiatric: She has a normal mood and affect. Her behavior is normal. Judgment and thought content normal  Musc: antalgic on left ankle with wb. Swelling in both legs 1+ to 2+. Right 5th finger with a catch during ext/flex  Assessment/Plan:  1. Deconditioning after STEMI/CABG 10/23/2013. Sternal precautions  2. DVT Prophylaxis/Anticoagulation: Coumadin per pharmacy. Latest INR 2.11 Monitor platelet counts and any signs of bleeding  3. Pain Management: well controlled. Gout management per pcp  -will provide her an rx for a figure 8 splint for her right ring finger  (she is an [facility] pt)    5. Hypertension/atrial fibrillation.-per cardiology 7. CHF:  Volume mgt per pcp 8. Hypothyroidism. Synthroid  9. Type 2 diabetes mellitus.off meds currently   Follow up with me prn. 15 minutes of face to face patient care time were spent during this visit. All questions were encouraged and answered.

## 2014-01-23 NOTE — Patient Instructions (Addendum)
Continue with exercise and weight loss efforts. Follow up with dr. Brigitte Pulse about your swelling and fluid pills.  PLEASE CALL ME WITH ANY PROBLEMS OR QUESTIONS [phone]).

## 2014-01-28 ENCOUNTER — Encounter (HOSPITAL_COMMUNITY)
Admission: RE | Admit: 2014-01-28 | Discharge: 2014-01-28 | Disposition: A | Discharge status: Home / Self Care | Payer: Self-pay | Source: Ambulatory Visit | Attending: Cardiology | Admitting: Cardiology

## 2014-01-28 DIAGNOSIS — Z5189 Encounter for other specified aftercare: Secondary | ICD-10-CM | POA: Insufficient documentation

## 2014-01-28 DIAGNOSIS — I2510 Atherosclerotic heart disease of native coronary artery without angina pectoris: Secondary | ICD-10-CM | POA: Insufficient documentation

## 2014-01-28 DIAGNOSIS — I4891 Unspecified atrial fibrillation: Secondary | ICD-10-CM | POA: Insufficient documentation

## 2014-01-28 DIAGNOSIS — Z951 Presence of aortocoronary bypass graft: Secondary | ICD-10-CM | POA: Insufficient documentation

## 2014-01-28 DIAGNOSIS — I447 Left bundle-branch block, unspecified: Secondary | ICD-10-CM | POA: Insufficient documentation

## 2014-01-28 DIAGNOSIS — I2589 Other forms of chronic ischemic heart disease: Secondary | ICD-10-CM | POA: Insufficient documentation

## 2014-01-28 DIAGNOSIS — I501 Left ventricular failure: Secondary | ICD-10-CM | POA: Insufficient documentation

## 2014-01-28 DIAGNOSIS — I2109 ST elevation (STEMI) myocardial infarction involving other coronary artery of anterior wall: Secondary | ICD-10-CM | POA: Insufficient documentation

## 2014-01-28 NOTE — Progress Notes (Signed)
Paperwork completed for Cardiac rehab Maintenance class. Reviewed exercise class flow, oriented to gym environment, reviewed medications. Did not exercise today, plans to begin exercise routine on Friday.

## 2014-01-30 ENCOUNTER — Encounter (HOSPITAL_COMMUNITY)
Admission: RE | Admit: 2014-01-30 | Discharge: 2014-01-30 | Disposition: A | Discharge status: Home / Self Care | Payer: Self-pay | Source: Ambulatory Visit | Attending: Cardiology | Admitting: Cardiology

## 2014-02-02 ENCOUNTER — Encounter (HOSPITAL_COMMUNITY): Payer: Self-pay | Admitting: Emergency Medicine

## 2014-02-02 ENCOUNTER — Other Ambulatory Visit: Payer: Self-pay

## 2014-02-02 ENCOUNTER — Inpatient Hospital Stay (HOSPITAL_COMMUNITY)
Admission: EM | Admit: 2014-02-02 | Discharge: 2014-02-05 | DRG: 683 | Disposition: A | Discharge status: Home / Self Care | Payer: Medicare HMO | Attending: Internal Medicine | Admitting: Internal Medicine

## 2014-02-02 ENCOUNTER — Encounter (HOSPITAL_COMMUNITY)
Admission: RE | Admit: 2014-02-02 | Discharge: 2014-02-02 | Disposition: A | Discharge status: Home / Self Care | Payer: Self-pay | Source: Ambulatory Visit | Attending: Cardiology | Admitting: Cardiology

## 2014-02-02 ENCOUNTER — Emergency Department (HOSPITAL_COMMUNITY): Payer: Medicare HMO

## 2014-02-02 ENCOUNTER — Inpatient Hospital Stay (HOSPITAL_COMMUNITY): Payer: Medicare HMO

## 2014-02-02 DIAGNOSIS — I2589 Other forms of chronic ischemic heart disease: Secondary | ICD-10-CM | POA: Diagnosis present

## 2014-02-02 DIAGNOSIS — I255 Ischemic cardiomyopathy: Secondary | ICD-10-CM

## 2014-02-02 DIAGNOSIS — E1142 Type 2 diabetes mellitus with diabetic polyneuropathy: Secondary | ICD-10-CM | POA: Diagnosis present

## 2014-02-02 DIAGNOSIS — R21 Rash and other nonspecific skin eruption: Secondary | ICD-10-CM | POA: Diagnosis present

## 2014-02-02 DIAGNOSIS — R5381 Other malaise: Secondary | ICD-10-CM

## 2014-02-02 DIAGNOSIS — I2510 Atherosclerotic heart disease of native coronary artery without angina pectoris: Secondary | ICD-10-CM

## 2014-02-02 DIAGNOSIS — N189 Chronic kidney disease, unspecified: Secondary | ICD-10-CM | POA: Diagnosis present

## 2014-02-02 DIAGNOSIS — Z87891 Personal history of nicotine dependence: Secondary | ICD-10-CM

## 2014-02-02 DIAGNOSIS — E669 Obesity, unspecified: Secondary | ICD-10-CM

## 2014-02-02 DIAGNOSIS — I509 Heart failure, unspecified: Secondary | ICD-10-CM | POA: Diagnosis present

## 2014-02-02 DIAGNOSIS — Z947 Corneal transplant status: Secondary | ICD-10-CM

## 2014-02-02 DIAGNOSIS — I501 Left ventricular failure: Secondary | ICD-10-CM

## 2014-02-02 DIAGNOSIS — E872 Acidosis, unspecified: Secondary | ICD-10-CM | POA: Diagnosis present

## 2014-02-02 DIAGNOSIS — I739 Peripheral vascular disease, unspecified: Secondary | ICD-10-CM | POA: Diagnosis present

## 2014-02-02 DIAGNOSIS — Z6841 Body Mass Index (BMI) 40.0 and over, adult: Secondary | ICD-10-CM

## 2014-02-02 DIAGNOSIS — Z79899 Other long term (current) drug therapy: Secondary | ICD-10-CM

## 2014-02-02 DIAGNOSIS — N183 Chronic kidney disease, stage 3 unspecified: Secondary | ICD-10-CM | POA: Diagnosis present

## 2014-02-02 DIAGNOSIS — I2109 ST elevation (STEMI) myocardial infarction involving other coronary artery of anterior wall: Secondary | ICD-10-CM

## 2014-02-02 DIAGNOSIS — Z7901 Long term (current) use of anticoagulants: Secondary | ICD-10-CM

## 2014-02-02 DIAGNOSIS — I480 Paroxysmal atrial fibrillation: Secondary | ICD-10-CM

## 2014-02-02 DIAGNOSIS — R55 Syncope and collapse: Secondary | ICD-10-CM

## 2014-02-02 DIAGNOSIS — I252 Old myocardial infarction: Secondary | ICD-10-CM

## 2014-02-02 DIAGNOSIS — I4891 Unspecified atrial fibrillation: Secondary | ICD-10-CM | POA: Diagnosis present

## 2014-02-02 DIAGNOSIS — E785 Hyperlipidemia, unspecified: Secondary | ICD-10-CM | POA: Diagnosis present

## 2014-02-02 DIAGNOSIS — Z7982 Long term (current) use of aspirin: Secondary | ICD-10-CM

## 2014-02-02 DIAGNOSIS — Z5181 Encounter for therapeutic drug level monitoring: Secondary | ICD-10-CM

## 2014-02-02 DIAGNOSIS — D631 Anemia in chronic kidney disease: Secondary | ICD-10-CM | POA: Diagnosis present

## 2014-02-02 DIAGNOSIS — E1149 Type 2 diabetes mellitus with other diabetic neurological complication: Secondary | ICD-10-CM | POA: Diagnosis present

## 2014-02-02 DIAGNOSIS — E8770 Fluid overload, unspecified: Secondary | ICD-10-CM

## 2014-02-02 DIAGNOSIS — N184 Chronic kidney disease, stage 4 (severe): Secondary | ICD-10-CM | POA: Diagnosis present

## 2014-02-02 DIAGNOSIS — M1990 Unspecified osteoarthritis, unspecified site: Secondary | ICD-10-CM

## 2014-02-02 DIAGNOSIS — N039 Chronic nephritic syndrome with unspecified morphologic changes: Secondary | ICD-10-CM

## 2014-02-02 DIAGNOSIS — L299 Pruritus, unspecified: Secondary | ICD-10-CM

## 2014-02-02 DIAGNOSIS — I4821 Permanent atrial fibrillation: Secondary | ICD-10-CM | POA: Diagnosis present

## 2014-02-02 DIAGNOSIS — N179 Acute kidney failure, unspecified: Principal | ICD-10-CM | POA: Diagnosis present

## 2014-02-02 DIAGNOSIS — I447 Left bundle-branch block, unspecified: Secondary | ICD-10-CM | POA: Diagnosis present

## 2014-02-02 DIAGNOSIS — E118 Type 2 diabetes mellitus with unspecified complications: Secondary | ICD-10-CM

## 2014-02-02 DIAGNOSIS — I129 Hypertensive chronic kidney disease with stage 1 through stage 4 chronic kidney disease, or unspecified chronic kidney disease: Secondary | ICD-10-CM | POA: Diagnosis present

## 2014-02-02 DIAGNOSIS — Z951 Presence of aortocoronary bypass graft: Secondary | ICD-10-CM

## 2014-02-02 DIAGNOSIS — I959 Hypotension, unspecified: Secondary | ICD-10-CM | POA: Diagnosis present

## 2014-02-02 DIAGNOSIS — N289 Disorder of kidney and ureter, unspecified: Secondary | ICD-10-CM

## 2014-02-02 DIAGNOSIS — Z96649 Presence of unspecified artificial hip joint: Secondary | ICD-10-CM

## 2014-02-02 DIAGNOSIS — E1169 Type 2 diabetes mellitus with other specified complication: Secondary | ICD-10-CM | POA: Diagnosis present

## 2014-02-02 DIAGNOSIS — N1832 Chronic kidney disease, stage 3b: Secondary | ICD-10-CM | POA: Diagnosis present

## 2014-02-02 DIAGNOSIS — R0602 Shortness of breath: Secondary | ICD-10-CM

## 2014-02-02 DIAGNOSIS — E1140 Type 2 diabetes mellitus with diabetic neuropathy, unspecified: Secondary | ICD-10-CM

## 2014-02-02 DIAGNOSIS — E039 Hypothyroidism, unspecified: Secondary | ICD-10-CM | POA: Diagnosis present

## 2014-02-02 DIAGNOSIS — I10 Essential (primary) hypertension: Secondary | ICD-10-CM | POA: Diagnosis present

## 2014-02-02 LAB — GLUCOSE, CAPILLARY: Glucose-Capillary: 186 mg/dL — ABNORMAL HIGH (ref 70–99)

## 2014-02-02 LAB — I-STAT TROPONIN, ED: Troponin i, poc: 0.03 ng/mL (ref 0.00–0.08)

## 2014-02-02 LAB — URINALYSIS, ROUTINE W REFLEX MICROSCOPIC
BILIRUBIN URINE: NEGATIVE
Glucose, UA: NEGATIVE mg/dL
Hgb urine dipstick: NEGATIVE
Ketones, ur: NEGATIVE mg/dL
Leukocytes, UA: NEGATIVE
Nitrite: NEGATIVE
PH: 5 (ref 5.0–8.0)
Protein, ur: NEGATIVE mg/dL
Specific Gravity, Urine: 1.013 (ref 1.005–1.030)
UROBILINOGEN UA: 0.2 mg/dL (ref 0.0–1.0)

## 2014-02-02 LAB — BASIC METABOLIC PANEL
BUN: 76 mg/dL — ABNORMAL HIGH (ref 6–23)
CALCIUM: 8.7 mg/dL (ref 8.4–10.5)
CO2: 22 mEq/L (ref 19–32)
Chloride: 97 mEq/L (ref 96–112)
Creatinine, Ser: 4.19 mg/dL — ABNORMAL HIGH (ref 0.50–1.10)
GFR calc Af Amer: 11 mL/min — ABNORMAL LOW (ref >90)
GFR calc non Af Amer: 10 mL/min — ABNORMAL LOW (ref >90)
GLUCOSE: 152 mg/dL — AB (ref 70–99)
POTASSIUM: 4.7 meq/L (ref 3.7–5.3)
Sodium: 135 mEq/L — ABNORMAL LOW (ref 137–147)

## 2014-02-02 LAB — PROTIME-INR
INR: 2.48 — ABNORMAL HIGH (ref 0.00–1.49)
Prothrombin Time: 26 seconds — ABNORMAL HIGH (ref 11.6–15.2)

## 2014-02-02 LAB — CBC
HCT: 29.9 % — ABNORMAL LOW (ref 36.0–46.0)
HEMOGLOBIN: 9.7 g/dL — AB (ref 12.0–15.0)
MCH: 26.3 pg (ref 26.0–34.0)
MCHC: 32.4 g/dL (ref 30.0–36.0)
MCV: 81 fL (ref 78.0–100.0)
Platelets: 317 10*3/uL (ref 150–400)
RBC: 3.69 MIL/uL — ABNORMAL LOW (ref 3.87–5.11)
RDW: 17.7 % — ABNORMAL HIGH (ref 11.5–15.5)
WBC: 9.5 10*3/uL (ref 4.0–10.5)

## 2014-02-02 LAB — CBG MONITORING, ED: Glucose-Capillary: 151 mg/dL — ABNORMAL HIGH (ref 70–99)

## 2014-02-02 LAB — LACTIC ACID, PLASMA: LACTIC ACID, VENOUS: 1.3 mmol/L (ref 0.5–2.2)

## 2014-02-02 LAB — PRO B NATRIURETIC PEPTIDE: Pro B Natriuretic peptide (BNP): 2809 pg/mL — ABNORMAL HIGH (ref 0–125)

## 2014-02-02 MED ORDER — OXYCODONE HCL 5 MG PO TABS: 5.0000 mg | ORAL_TABLET | ORAL | Status: DC | PRN

## 2014-02-02 MED ORDER — CHLORHEXIDINE GLUCONATE 0.12 % MT SOLN
15.0000 mL | Freq: Two times a day (BID) | OROMUCOSAL | Status: DC
Start: 2014-02-02 — End: 2014-02-05
  Administered 2014-02-03 – 2014-02-05 (×5): 15 mL via OROMUCOSAL
  Filled 2014-02-02 (×8): qty 15

## 2014-02-02 MED ORDER — ENOXAPARIN SODIUM 30 MG/0.3ML ~~LOC~~ SOLN
30.0000 mg | SUBCUTANEOUS | Status: DC
  Administered 2014-02-02: 30 mg via SUBCUTANEOUS
  Filled 2014-02-02 (×2): qty 0.3

## 2014-02-02 MED ORDER — SERTRALINE HCL 50 MG PO TABS
50.0000 mg | ORAL_TABLET | Freq: Every day | ORAL | Status: DC
  Administered 2014-02-03 – 2014-02-05 (×3): 50 mg via ORAL
  Filled 2014-02-02 (×4): qty 1

## 2014-02-02 MED ORDER — PANTOPRAZOLE SODIUM 40 MG PO TBEC
40.0000 mg | DELAYED_RELEASE_TABLET | Freq: Every day | ORAL | Status: DC
  Administered 2014-02-03 – 2014-02-05 (×3): 40 mg via ORAL
  Filled 2014-02-02 (×3): qty 1

## 2014-02-02 MED ORDER — ATORVASTATIN CALCIUM 10 MG PO TABS
10.0000 mg | ORAL_TABLET | Freq: Every day | ORAL | Status: DC
  Administered 2014-02-03 – 2014-02-04 (×2): 10 mg via ORAL
  Filled 2014-02-02 (×3): qty 1

## 2014-02-02 MED ORDER — SENNOSIDES-DOCUSATE SODIUM 8.6-50 MG PO TABS: 1.0000 | ORAL_TABLET | Freq: Every evening | ORAL | Status: DC | PRN

## 2014-02-02 MED ORDER — FERROUS SULFATE 325 (65 FE) MG PO TABS
325.0000 mg | ORAL_TABLET | Freq: Every day | ORAL | Status: DC
  Administered 2014-02-03 – 2014-02-05 (×3): 325 mg via ORAL
  Filled 2014-02-02 (×4): qty 1

## 2014-02-02 MED ORDER — FEBUXOSTAT 40 MG PO TABS
40.0000 mg | ORAL_TABLET | Freq: Every day | ORAL | Status: DC
  Administered 2014-02-03 – 2014-02-05 (×3): 40 mg via ORAL
  Filled 2014-02-02 (×4): qty 1

## 2014-02-02 MED ORDER — CARVEDILOL 6.25 MG PO TABS
6.2500 mg | ORAL_TABLET | Freq: Two times a day (BID) | ORAL | Status: DC
  Filled 2014-02-02 (×4): qty 1

## 2014-02-02 MED ORDER — PREDNISOLONE ACETATE 1 % OP SUSP
1.0000 [drp] | Freq: Every day | OPHTHALMIC | Status: DC
  Administered 2014-02-03 – 2014-02-05 (×3): 1 [drp] via OPHTHALMIC
  Filled 2014-02-02: qty 1

## 2014-02-02 MED ORDER — BIOTENE DRY MOUTH MT LIQD
15.0000 mL | Freq: Two times a day (BID) | OROMUCOSAL | Status: DC
Start: 2014-02-03 — End: 2014-02-05
  Administered 2014-02-03 – 2014-02-05 (×4): 15 mL via OROMUCOSAL

## 2014-02-02 MED ORDER — ACETAMINOPHEN 325 MG PO TABS
650.0000 mg | ORAL_TABLET | Freq: Four times a day (QID) | ORAL | Status: DC | PRN
Start: 2014-02-02 — End: 2014-02-05

## 2014-02-02 MED ORDER — WARFARIN 1.25 MG HALF TABLET
1.2500 mg | ORAL_TABLET | Freq: Once | ORAL | Status: AC
  Administered 2014-02-02: 1.25 mg via ORAL
  Filled 2014-02-02: qty 1

## 2014-02-02 MED ORDER — MAGIC MOUTHWASH
5.0000 mL | Freq: Four times a day (QID) | ORAL | Status: DC
  Administered 2014-02-02 – 2014-02-05 (×10): 5 mL via ORAL
  Filled 2014-02-02 (×14): qty 5

## 2014-02-02 MED ORDER — BISACODYL 10 MG RE SUPP
10.0000 mg | Freq: Every day | RECTAL | Status: DC | PRN
Start: 2014-02-02 — End: 2014-02-05

## 2014-02-02 MED ORDER — ONDANSETRON HCL 4 MG PO TABS: 4.0000 mg | ORAL_TABLET | Freq: Four times a day (QID) | ORAL | Status: DC | PRN

## 2014-02-02 MED ORDER — SODIUM CHLORIDE 0.9 % IV SOLN
INTRAVENOUS | Status: DC
  Administered 2014-02-02: 75 mL/h via INTRAVENOUS
  Administered 2014-02-03 – 2014-02-05 (×3): via INTRAVENOUS

## 2014-02-02 MED ORDER — ACETAMINOPHEN 650 MG RE SUPP: 650.0000 mg | Freq: Four times a day (QID) | RECTAL | Status: DC | PRN

## 2014-02-02 MED ORDER — LEVOTHYROXINE SODIUM 200 MCG PO TABS
200.0000 ug | ORAL_TABLET | Freq: Every day | ORAL | Status: DC
  Filled 2014-02-02 (×2): qty 1

## 2014-02-02 MED ORDER — HYDROXYZINE HCL 25 MG PO TABS: 25.0000 mg | ORAL_TABLET | Freq: Three times a day (TID) | ORAL | Status: DC | PRN

## 2014-02-02 MED ORDER — ONDANSETRON HCL 4 MG/2ML IJ SOLN: 4.0000 mg | Freq: Four times a day (QID) | INTRAMUSCULAR | Status: DC | PRN

## 2014-02-02 MED ORDER — WARFARIN - PHARMACIST DOSING INPATIENT: Freq: Every day | Status: DC

## 2014-02-02 MED ORDER — FLEET ENEMA 7-19 GM/118ML RE ENEM
1.0000 | ENEMA | Freq: Once | RECTAL | Status: AC | PRN
  Filled 2014-02-02: qty 1

## 2014-02-02 MED ORDER — INSULIN ASPART 100 UNIT/ML ~~LOC~~ SOLN: 0.0000 [IU] | Freq: Every day | SUBCUTANEOUS | Status: DC

## 2014-02-02 MED ORDER — ASPIRIN EC 81 MG PO TBEC
81.0000 mg | DELAYED_RELEASE_TABLET | Freq: Every day | ORAL | Status: DC
  Administered 2014-02-03 – 2014-02-05 (×3): 81 mg via ORAL
  Filled 2014-02-02 (×4): qty 1

## 2014-02-02 MED ORDER — INSULIN ASPART 100 UNIT/ML ~~LOC~~ SOLN
0.0000 [IU] | Freq: Three times a day (TID) | SUBCUTANEOUS | Status: DC
  Administered 2014-02-03: 3 [IU] via SUBCUTANEOUS

## 2014-02-02 NOTE — ED Notes (Addendum)
Patient from cardiac rehab, was walking in to appointment when she began to feel light headed.  VS at that facility are BP: 80/50, HR 74 (1st degree block), recent bypass surgery in February, CBG at that time 177.  Seen by Dr. Ellyn Hack and Dr. Brigitte Pulse.    Has a rash she has had for over a month, recently changed medications.  Denies chest pain, denies sob.

## 2014-02-02 NOTE — ED Notes (Signed)
Pt taken to US

## 2014-02-02 NOTE — ED Notes (Signed)
Spoke with EDP states [facility] admitting patient and will see patient in ED.  EDP ordered patient able to eat heart healthy ordered by secretary.

## 2014-02-02 NOTE — ED Notes (Signed)
All pt belongings sent with pt.

## 2014-02-02 NOTE — ED Notes (Signed)
Secretary to paged [employer].

## 2014-02-02 NOTE — Progress Notes (Signed)
Reason for Consult: Near syncope at cardiac rehab  Requesting Physician: ER  HPI: This is a 73 y.o. female with a past medical history significant for CAD. She had a STEMI in Feb 2015. She ultimately had CABG X 3 10/23/13. She had peri-op PAF and was discharged on Coumadin and Amiodarone. She also had LVD with an EF of 20-25% but that was improved to 40-45% by echo 01/13/14.            Several weeks ago she had her diuretics increased secondary to edema. This apparently caused a gout flair up. Her diuretics were cut back to her usual dose of Lasix 80/40. She then developed a pruritic rash 01/16/14. Her Allopurinol and her Amiodarone were stopped. Her symptoms have persisted but have improved some. Her rash is mainly on her arms and legs.             Today she was in cardiac rehab and became dizzy. Her B/P was noted to be in the 80s and she was sent to the ED. She is in NSR and comfortable when I examined her. Her labs show a BUN of 76 and a SCr of 4.19, Baseline Scr 1.5-1.7.   PMHx:  Past Medical History  Diagnosis Date  . Arthritis   . Heart murmur   . Hyperlipidemia   . Hypertension   . Hypothyroidism   . Ischemic cardiomyopathy 10/20/2013    Echo 2/23: EF 20-25%; mild LVH. anteroseptal Akinesis; mid-apical anterior, inferior & inferoseptal akinesis; apical-lateral akinesis; apical akinesis; Grade 1 D Dysfxn.; Mild-Mod LA dilatoin; mild MR; Mod PHTN  . Acute pulmonary edema with congestive heart failure 10/20/2013    Resolved  . Left bundle branch block (LBBB) on electrocardiogram   . Atrial fibrillation 10/20/2013  . OSTEOARTHRITIS 08/06/2006  . Morbid obesity   . Diabetic neuropathy   . Type II diabetes mellitus with complication 30/02/6225  . Chronic kidney disease   . S/P CABG x 3 10/23/2013    LIMA to LAD, SVG to OM2, SVG to RPLB, EVH via right thigh  . Acute myocardial infarction of anterior wall 10/20/2013    Afib, LBBB  . CAD, multiple vessel 10/20/2013    LM, LAD & RCA --> CABG    Past Surgical History  Procedure Laterality Date  . Corneal transplant  2011    right eye  . Total hip arthroplasty  2010, 2011    right 2010, left 2011  . Abdominal hysterectomy    . Coronary artery bypass graft N/A 10/23/2013    Procedure: CORONARY ARTERY BYPASS GRAFTING (CABG);  Surgeon: Rexene Alberts, MD;  Location: [place];  Service: Open Heart Surgery;  Laterality: N/A;  . Intraoperative transesophageal echocardiogram N/A 10/23/2013    Procedure: INTRAOPERATIVE TRANSESOPHAGEAL ECHOCARDIOGRAM;  Surgeon: Rexene Alberts, MD;  Location: [place];  Service: Open Heart Surgery;  Laterality: N/A;  . Clipping of atrial appendage N/A 10/23/2013    Procedure: CLIPPING OF ATRIAL APPENDAGE;  Surgeon: Rexene Alberts, MD;  Location: [place];  Service: Open Heart Surgery;  Laterality: N/A;  . Cardiac catheterization  10/20/2013    Distal LM ~70%, LAD - ostial 70%, prox 80, 95 & 95%; RCA prox 70%, mid 80%; minimal Cx disease  . Coronary artery bypass graft  10/23/2013    LIMA-LAD, SVG-OM2, SVG-RPL    FAMHx: Cancer   SOCHx:  reports that she quit smoking about 20 years ago. She has never used smokeless tobacco. She reports that she does not drink  alcohol or use illicit drugs.  ALLERGIES: Allergies  Allergen Reactions  . Allopurinol Hives  . Tape Rash    Adhesive tape    ROS: A comprehensive review of systems was negative except for: no GI bleeding or bloody stoools  HOME MEDICATIONS:   Medication List           aspirin EC 81 MG tablet  Take 1 tablet (81 mg total) by mouth daily.     carvedilol 6.25 MG tablet  Commonly known as:  COREG  Take 1 tablet (6.25 mg total) by mouth 2 (two) times daily with a meal.     febuxostat 40 MG tablet  Commonly known as:  ULORIC  Take 40 mg by mouth daily.     ferrous sulfate 325 (65 FE) MG tablet  Take 1 tablet (325 mg total) by mouth daily with breakfast.     furosemide 40 MG tablet  Commonly known as:  LASIX  Take 1 tablet (40 mg total)  by mouth daily.     hydrOXYzine 25 MG tablet  Commonly known as:  ATARAX/VISTARIL  Take 25 mg by mouth 3 (three) times daily as needed for itching.     levothyroxine 112 MCG tablet  Commonly known as:  SYNTHROID, LEVOTHROID  Take 200 mcg by mouth daily before breakfast.     lisinopril 2.5 MG tablet  Commonly known as:  PRINIVIL,ZESTRIL  Take 1 tablet (2.5 mg total) by mouth daily.     magic mouthwash Soln  Take 5 mLs by mouth 4 (four) times daily.     oxyCODONE 5 MG immediate release tablet  Commonly known as:  Oxy IR/ROXICODONE  Take 1-2 tablets (5-10 mg total) by mouth every 3 (three) hours as needed for moderate pain.     pantoprazole 40 MG tablet  Commonly known as:  PROTONIX  Take 1 tablet (40 mg total) by mouth daily.     potassium chloride SA 20 MEQ tablet  Commonly known as:  K-DUR,KLOR-CON  Take 1 tablet (20 mEq total) by mouth daily.     prednisoLONE acetate 1 % ophthalmic suspension  Commonly known as:  PRED FORTE  Place 1 drop into the right eye daily.     rosuvastatin 20 MG tablet  Commonly known as:  CRESTOR  Take 10 mg by mouth daily.     sertraline 50 MG tablet  Commonly known as:  ZOLOFT  Take 1 tablet by mouth daily.     warfarin 2.5 MG tablet  Commonly known as:  COUMADIN  Take 1.25-2.5 mg by mouth daily. Monday and Friday 1.25 mg, 2.5 mg all other days.         HOSPITAL MEDICATIONS: I have reviewed the patient's current medications.  VITALS: Blood pressure 110/59, pulse 61, temperature 97.7 F (36.5 C), temperature source Oral, resp. rate 16, height 5' 2"  (1.575 m), weight 111.2 kg (245 lb 2.4 oz), SpO2 100.00%.  PHYSICAL EXAM: General appearance: alert, cooperative, no distress and morbidly obese Neck: no carotid bruit and no JVD Lungs: clear to auscultation bilaterally Heart: regular rate and rhythm Abdomen: obese Extremities: bilat trace LE edema. Upper and lower errythematous rach on her arms and legs Pulses: 2+ and  symmetric Skin: cool and dry Neurologic: Grossly normal  LABS: Results for orders placed during the hospital encounter of 02/02/14 (from the past 48 hour(s))  CBC     Status: Abnormal   Collection Time    02/02/14 12:35 PM      Result Value  Ref Range   WBC 9.5  4.0 - 10.5 K/uL   RBC 3.69 (*) 3.87 - 5.11 MIL/uL   Hemoglobin 9.7 (*) 12.0 - 15.0 g/dL   HCT 29.9 (*) 36.0 - 46.0 %   MCV 81.0  78.0 - 100.0 fL   MCH 26.3  26.0 - 34.0 pg   MCHC 32.4  30.0 - 36.0 g/dL   RDW 17.7 (*) 11.5 - 15.5 %   Platelets 317  150 - 400 K/uL  BASIC METABOLIC PANEL     Status: Abnormal   Collection Time    02/02/14 12:35 PM      Result Value Ref Range   Sodium 135 (*) 137 - 147 mEq/L   Potassium 4.7  3.7 - 5.3 mEq/L   Chloride 97  96 - 112 mEq/L   CO2 22  19 - 32 mEq/L   Glucose, Bld 152 (*) 70 - 99 mg/dL   BUN 76 (*) 6 - 23 mg/dL   Creatinine, Ser 4.19 (*) 0.50 - 1.10 mg/dL   Calcium 8.7  8.4 - 10.5 mg/dL   GFR calc non Af Amer 10 (*) >90 mL/min   GFR calc Af Amer 11 (*) >90 mL/min   Comment: (NOTE)     The eGFR has been calculated using the CKD EPI equation.     This calculation has not been validated in all clinical situations.     eGFR's persistently <90 mL/min signify possible Chronic Kidney     Disease.  PRO B NATRIURETIC PEPTIDE     Status: Abnormal   Collection Time    02/02/14 12:35 PM      Result Value Ref Range   Pro B Natriuretic peptide (BNP) 2809.0 (*) 0 - 125 pg/mL  I-STAT TROPOININ, ED     Status: None   Collection Time    02/02/14 12:47 PM      Result Value Ref Range   Troponin i, poc 0.03  0.00 - 0.08 ng/mL   Comment 3            Comment: Due to the release kinetics of cTnI,     a negative result within the first hours     of the onset of symptoms does not rule out     myocardial infarction with certainty.     If myocardial infarction is still suspected,     repeat the test at appropriate intervals.  LACTIC ACID, PLASMA     Status: None   Collection Time    02/02/14   1:50 PM      Result Value Ref Range   Lactic Acid, Venous 1.3  0.5 - 2.2 mmol/L    EKG: NSR, LBBB, first degree AVB  IMAGING: Dg Chest Portable 1 View  02/02/2014   CLINICAL DATA:  Chest pain with weakness and shortness of breath.  EXAM: PORTABLE CHEST - 1 VIEW  COMPARISON:  01/05/2014.  FINDINGS: Trachea is midline. Heart is at the upper limits of normal in size to mildly enlarged. Lungs are clear. Tiny residual left pleural effusion.  IMPRESSION: Tiny residual left pleural effusion.   Electronically Signed   By: Lorin Picket M.D.   On: 02/02/2014 13:37    IMPRESSION: Principal Problem:   Near syncope Active Problems:   Acute on chronic renal insufficiency   Hypotension   Type II diabetes mellitus with complication   ICM EF by Echo 20-25% Feb 2015, recently improved to 40-45% by echo 01/13/14   Chronic kidney disease, stage III (  moderate)   S/P CABG x 3- 10/23/13   Rash   HYPERLIPIDEMIA   Obesity   HYPERTENSION- now hypotensive   PAF- periop CABG   Left bundle branch block (LBBB) on electrocardiogram   Morbid obesity- BMI 44   Diabetic neuropathy   Coumadin Rx post CABG   RECOMMENDATION: MD to see. Hydrate gently, stop ACE. Will ask renal to see.   Time Spent Directly with Patient: 49 minutes  Erlene Quan [phone] beeper 02/02/2014, 3:18 PM    Patient seen and examined. Agree with assessment and plan. Very pleasant 61 AAF who suffered a STEMI in 09/2013 and ultimately underwent CABGx3 with AF RX with LA clipping, amiodarone and coumadin. Currently in NSR. Baseline Cr 1.6. She has had recent gout flare and has developed a rash leading to discontinuance of amiodarone and allupurinol. She presented today with pre-syncope and hypotension. Of note Cr now 4.19. Will hydrate, dc ace-I ? Contributed by allupurinol./ Will ask renal to see. Also with erythemotus macular papular rash ? Secondary to med.   Troy Sine, MD, [facility] 02/02/2014 3:43 PM

## 2014-02-02 NOTE — Progress Notes (Addendum)
New Admission Note:   Arrival: on bed via ED with tech Mental Orientation: A&Ox4 Telemetry: placed on box 6E16, NSR Assessment:  See doc flowsheet Skin: intact, old surgical scar on mid chest-pt stated from "old heart attack", red rash on arms and legs bilaterally, bandaid on neck-pt stated she had scratched a mole earlier, ulcer on right side of mouth IV: left AC, infusing NS Pain: none Safety Measures:  Call bell placed within reach; patient instructed on use of call bell and verbalized understanding. Bed in lowest position.  Non-skid socks on.  Bed alarm on. [address] Orientation: Patient oriented to staff, room, and unit. Family: son at bedside  Orders have been reviewed and implemented. Will continue to monitor.  Arlyss Queen, RN, BSN

## 2014-02-02 NOTE — ED Notes (Signed)
Provider at bedside

## 2014-02-02 NOTE — ED Notes (Signed)
Paged Dr. Sharlett Iles to [phone]

## 2014-02-02 NOTE — ED Notes (Signed)
Pt back from US

## 2014-02-02 NOTE — ED Notes (Signed)
Cardiology at beside.

## 2014-02-02 NOTE — ED Notes (Signed)
Spoke with [facility] Doctor stated currently en route to see patient in the ED.

## 2014-02-02 NOTE — ED Notes (Signed)
Admit Doctor at bedside.  

## 2014-02-02 NOTE — Progress Notes (Signed)
ANTICOAGULATION CONSULT NOTE - Initial Consult  Pharmacy Consult for Coumadin Indication: atrial fibrillation  Allergies  Allergen Reactions  . Allopurinol Hives  . Tape Rash    Adhesive tape    Patient Measurements: Height: 5\' 2"  (157.5 cm) Weight: 245 lb 2.4 oz (111.2 kg) IBW/kg (Calculated) : 50.1  Vital Signs: Temp: 97.7 F (36.5 C) (06/08 1226) Temp src: Oral (06/08 1226) BP: 101/45 mmHg (06/08 1529) Pulse Rate: 61 (06/08 1529)  Labs:  Recent Labs  02/02/14 1235  HGB 9.7*  HCT 29.9*  PLT 317  CREATININE 4.19*    Estimated Creatinine Clearance: 14.1 ml/min (by C-G formula based on Cr of 4.19).   Medical History: Past Medical History  Diagnosis Date  . Arthritis   . Heart murmur   . Hyperlipidemia   . Hypertension   . Hypothyroidism   . Ischemic cardiomyopathy 10/20/2013    Echo 2/23: EF 20-25%; mild LVH. anteroseptal Akinesis; mid-apical anterior, inferior & inferoseptal akinesis; apical-lateral akinesis; apical akinesis; Grade 1 D Dysfxn.; Mild-Mod LA dilatoin; mild MR; Mod PHTN  . Acute pulmonary edema with congestive heart failure 10/20/2013    Resolved  . Left bundle branch block (LBBB) on electrocardiogram   . Atrial fibrillation 10/20/2013  . OSTEOARTHRITIS 08/06/2006  . Morbid obesity   . Diabetic neuropathy   . Type II diabetes mellitus with complication [id number]  . Chronic kidney disease   . S/P CABG x 3 10/23/2013    LIMA to LAD, SVG to OM2, SVG to RPLB, EVH via right thigh  . Acute myocardial infarction of anterior wall 10/20/2013    Afib, LBBB  . CAD, multiple vessel 10/20/2013    LM, LAD & RCA --> CABG    Medications:  See PTA medication list  Assessment: 73 y/o female who had lightheadedness upon arrival to cardiac rehab and was sent to the ED for further evaluation. She had a STEMI and subsequent CABG x3 in Feb with periop Afib and was started on Coumadin. Pharmacy consulted to manage Coumadin while inpatient. INR is therapeutic at  2.48. No bleeding noted, Hb is low at 9.7, platelets are normal.  Home Coumadin regimen is 2.5 mg daily except 1.25 mg on Mon and Fri. Last dose at home was 6/7.  Of note, she has had a rash for the past month which led to the discontinuation of amiodarone and allopurinol.  Goal of Therapy:  INR 2-3 Monitor platelets by anticoagulation protocol: Yes   Plan:  - Coumadin 1.25 mg PO tonight - INR daily  [facility], Pharm.D., BCPS Clinical Pharmacist Pager: [phone] 02/02/2014 4:14 PM

## 2014-02-02 NOTE — Progress Notes (Signed)
Patient's blood pressure was noted at 80/50 upon entry to cardiac rehab rehab maintenance. Patient reports feeling lightheaded. Patient given Gatorade. Repeat blood pressure 84/56. Heart rate 74. Oxygen saturation 97% on room air. Lung fields clear but diminished. Patient has bilateral lower extremity edema. Patient has a red macro papular rash that has been present for the past month. Cecilie Kicks FNP-C called and notifed. Advised to take the patient to the ED for further evaluation.  CBG 177. Patient placed on the [place]oll rhythm appears to be Sinus with a first degree heart block. Patient called her husband to let him know about pending transfer to the ED. Patient transported to the ED on a stretcher. Report given to Ed RN.

## 2014-02-02 NOTE — ED Provider Notes (Signed)
CSN: [id number]     Arrival date & time 02/02/14  1223 History   None    Chief Complaint  Patient presents with  . Near Syncope   HPI Comments: Patient presents to the [facility] ED with near syncopal episode this morning.  Patient states that she was getting up to do errands and felt "loopy headed, and light headed and might pass out."  Patient states that she had errands to do so she continued on with her morning and the feeling went away two hours later.  Patient was seen at cardiac rehab who sent her to the ED because her blood pressure was 80/60 for several different readings.  Patient states that she had a similar episode of near syncope on Thursday.  Patient admits to having a recent medication change with the addition of hydroxazine added by Dr. Brigitte Pulse for itching, and also a recent change in her HTN medicines.  She states that she is feeling much better now.  Patient denies chest pain, SOB, pleuritic chest pain, nausea, vomiting, fever, chills, cough, sputum production, leg swelling changes from baseline or confusion.  She does admit to some decreases in her Urine output.  She denies hematuria, dysuria, frequency, or urgency.  PMH significant for anemia at baseline, CHF with 20-25% EF, HTN, CKD, hypothyroid, A. Fib, and old LBBB secondary to myocardial infarction.     Patient is a 73 y.o. female presenting with near-syncope. The history is provided by the patient. No language interpreter was used.  Near Syncope    Past Medical History  Diagnosis Date  . Arthritis   . Heart murmur   . Hyperlipidemia   . Hypertension   . Hypothyroidism   . Ischemic cardiomyopathy 10/20/2013    Echo 2/23: EF 20-25%; mild LVH. anteroseptal Akinesis; mid-apical anterior, inferior & inferoseptal akinesis; apical-lateral akinesis; apical akinesis; Grade 1 D Dysfxn.; Mild-Mod LA dilatoin; mild MR; Mod PHTN  . Acute pulmonary edema with congestive heart failure 10/20/2013    Resolved  . Left bundle branch block (LBBB) on  electrocardiogram   . Atrial fibrillation 10/20/2013  . OSTEOARTHRITIS 08/06/2006  . Morbid obesity   . Diabetic neuropathy   . Type II diabetes mellitus with complication [id number]  . Chronic kidney disease   . S/P CABG x 3 10/23/2013    LIMA to LAD, SVG to OM2, SVG to RPLB, EVH via right thigh  . Acute myocardial infarction of anterior wall 10/20/2013    Afib, LBBB  . CAD, multiple vessel 10/20/2013    LM, LAD & RCA --> CABG   Past Surgical History  Procedure Laterality Date  . Corneal transplant  2011    right eye  . Total hip arthroplasty  2010, 2011    right 2010, left 2011  . Abdominal hysterectomy    . Coronary artery bypass graft N/A 10/23/2013    Procedure: CORONARY ARTERY BYPASS GRAFTING (CABG);  Surgeon: Rexene Alberts, MD;  Location: [place];  Service: Open Heart Surgery;  Laterality: N/A;  . Intraoperative transesophageal echocardiogram N/A 10/23/2013    Procedure: INTRAOPERATIVE TRANSESOPHAGEAL ECHOCARDIOGRAM;  Surgeon: Rexene Alberts, MD;  Location: [place];  Service: Open Heart Surgery;  Laterality: N/A;  . Clipping of atrial appendage N/A 10/23/2013    Procedure: CLIPPING OF ATRIAL APPENDAGE;  Surgeon: Rexene Alberts, MD;  Location: [place];  Service: Open Heart Surgery;  Laterality: N/A;  . Cardiac catheterization  10/20/2013    Distal LM ~70%, LAD - ostial 70%, prox 80, 95 &  95%; RCA prox 70%, mid 80%; minimal Cx disease  . Coronary artery bypass graft  10/23/2013    LIMA-LAD, SVG-OM2, SVG-RPL   Family History  Problem Relation Age of Onset  . Colon cancer Neg Hx   . Esophageal cancer Neg Hx   . Rectal cancer Neg Hx   . Stomach cancer Neg Hx    History  Substance Use Topics  . Smoking status: Former Smoker    Quit date: 08/28/1993  . Smokeless tobacco: Never Used  . Alcohol Use: No   OB History   Grav Para Term Preterm Abortions TAB SAB Ect Mult Living                 Review of Systems  Cardiovascular: Positive for near-syncope.      Allergies   Allopurinol and Tape  Home Medications   Prior to Admission medications   Medication Sig Start Date End Date Taking? Authorizing Provider  Alum & Mag Hydroxide-Simeth (MAGIC MOUTHWASH) SOLN Take 5 mLs by mouth 4 (four) times daily.   Yes Historical Provider, MD  aspirin EC 81 MG tablet Take 1 tablet (81 mg total) by mouth daily. 11/13/13  Yes Daniel J Angiulli, PA-C  carvedilol (COREG) 6.25 MG tablet Take 1 tablet (6.25 mg total) by mouth 2 (two) times daily with a meal. 01/09/14  Yes Leonie Man, MD  febuxostat (ULORIC) 40 MG tablet Take 40 mg by mouth daily.   Yes Historical Provider, MD  ferrous sulfate 325 (65 FE) MG tablet Take 1 tablet (325 mg total) by mouth daily with breakfast. 11/13/13  Yes Daniel J Angiulli, PA-C  furosemide (LASIX) 40 MG tablet Take 1 tablet (40 mg total) by mouth daily. 01/09/14  Yes Leonie Man, MD  hydrOXYzine (ATARAX/VISTARIL) 25 MG tablet Take 25 mg by mouth 3 (three) times daily as needed for itching.   Yes Historical Provider, MD  levothyroxine (SYNTHROID, LEVOTHROID) 112 MCG tablet Take 200 mcg by mouth daily before breakfast. 11/13/13  Yes Daniel J Angiulli, PA-C  lisinopril (PRINIVIL,ZESTRIL) 2.5 MG tablet Take 1 tablet (2.5 mg total) by mouth daily. 11/13/13  Yes Daniel J Angiulli, PA-C  oxyCODONE (OXY IR/ROXICODONE) 5 MG immediate release tablet Take 1-2 tablets (5-10 mg total) by mouth every 3 (three) hours as needed for moderate pain. 11/13/13  Yes Daniel J Angiulli, PA-C  pantoprazole (PROTONIX) 40 MG tablet Take 1 tablet (40 mg total) by mouth daily. 01/14/14  Yes Leonie Man, MD  potassium chloride SA (K-DUR,KLOR-CON) 20 MEQ tablet Take 1 tablet (20 mEq total) by mouth daily. 01/09/14  Yes Leonie Man, MD  prednisoLONE acetate (PRED FORTE) 1 % ophthalmic suspension Place 1 drop into the right eye daily.   Yes Historical Provider, MD  rosuvastatin (CRESTOR) 20 MG tablet Take 10 mg by mouth daily.   Yes Historical Provider, MD  sertraline  (ZOLOFT) 50 MG tablet Take 1 tablet by mouth daily. 01/02/14  Yes Historical Provider, MD  warfarin (COUMADIN) 2.5 MG tablet Take 1.25-2.5 mg by mouth daily. Monday and Friday 1.25 mg, 2.5 mg all other days.   Yes Historical Provider, MD   BP 101/45  Pulse 61  Temp(Src) 97.7 F (36.5 C) (Oral)  Resp 13  Ht 5\' 2"  (1.575 m)  Wt 245 lb 2.4 oz (111.2 kg)  BMI 44.83 kg/m2  SpO2 100% Physical Exam  ED Course  Procedures (including critical care time) Labs Review Labs Reviewed  CBC - Abnormal; Notable for the following:  RBC 3.69 (*)    Hemoglobin 9.7 (*)    HCT 29.9 (*)    RDW 17.7 (*)    All other components within normal limits  BASIC METABOLIC PANEL - Abnormal; Notable for the following:    Sodium 135 (*)    Glucose, Bld 152 (*)    BUN 76 (*)    Creatinine, Ser 4.19 (*)    GFR calc non Af Amer 10 (*)    GFR calc Af Amer 11 (*)    All other components within normal limits  PRO B NATRIURETIC PEPTIDE - Abnormal; Notable for the following:    Pro B Natriuretic peptide (BNP) 2809.0 (*)    All other components within normal limits  LACTIC ACID, PLASMA  URINALYSIS, ROUTINE W REFLEX MICROSCOPIC  PROTIME-INR  CBG MONITORING, ED  I-STAT TROPOININ, ED    Imaging Review Dg Chest Portable 1 View  02/02/2014   CLINICAL DATA:  Chest pain with weakness and shortness of breath.  EXAM: PORTABLE CHEST - 1 VIEW  COMPARISON:  01/05/2014.  FINDINGS: Trachea is midline. Heart is at the upper limits of normal in size to mildly enlarged. Lungs are clear. Tiny residual left pleural effusion.  IMPRESSION: Tiny residual left pleural effusion.   Electronically Signed   By: Lorin Picket M.D.   On: 02/02/2014 13:37     EKG Interpretation None      MDM   Final diagnoses:  AKI (acute kidney injury)  Syncope, near  CHF (congestive heart failure)   Patient presents to the ED with near syncopal episode.  BNP is elevated, CXR appears to have left effusion and cephalization by my read, and  elevated SCr and BUN.  Patient appears to have cardiorenal syndrome.  Cardiology has been consulted to admit at this time.  Cardiology feels at this time that the patient should be a medicine admit at this time due to renal concerns and that they will continue to consult.  Dr. Kathrynn Humble has been apprised of the patient's current situation and has consulted the [place] primary care for admission to the hospital at this time.  Cardiology has already consulted nephrology at this time.                 Kenard Gower, PA-C 02/02/14 1620

## 2014-02-02 NOTE — H&P (Signed)
Elaine [name] is an 72 y.o. female.   Chief Complaint: dizzy earlier today HPI:  Patient is a 73 year old woman of African American descent who was admitted from cardiac rehabilitation because of extremely low blood pressure in the 45-85 systolic range. She has been recently treated with higher dose Lasix because of fluid retention in the face of moderately severe renal insufficiency. A consultation with nephrology was initiated last week with plans for first visit within the next month. She had been doing daily body weights and had not noted significant weight loss recently. She had been feeling somewhat woozy lately. Her food and fluid consumption has not changed. She has not been having fever, chills, nausea, vomiting, diarrhea, or frequency. When seen earlier today by a cardiologist she was in a sinus rhythm it was recommended that her lisinopril be stopped and IV fluids be given. Since initial evaluation in the emergency room she notes some decreased dizziness and fatigue. Her medical history is most significant for diabetes mellitus, type II, hypertension, coronary artery disease with ischemic cardiomyopathy, and paroxysmal atrial fibrillation. Past Medical History  Diagnosis Date  . Arthritis   . Heart murmur   . Hyperlipidemia   . Hypertension   . Hypothyroidism   . Ischemic cardiomyopathy 10/20/2013    Echo 2/23: EF 20-25%; mild LVH. anteroseptal Akinesis; mid-apical anterior, inferior & inferoseptal akinesis; apical-lateral akinesis; apical akinesis; Grade 1 D Dysfxn.; Mild-Mod LA dilatoin; mild MR; Mod PHTN  . Acute pulmonary edema with congestive heart failure 10/20/2013    Resolved  . Left bundle branch block (LBBB) on electrocardiogram   . Atrial fibrillation 10/20/2013  . OSTEOARTHRITIS 08/06/2006  . Morbid obesity   . Diabetic neuropathy   . Type II diabetes mellitus with complication 92/92/4462  . Chronic kidney disease   . S/P CABG x 3 10/23/2013    LIMA to LAD, SVG to OM2, SVG  to RPLB, EVH via right thigh  . Acute myocardial infarction of anterior wall 10/20/2013    Afib, LBBB  . CAD, multiple vessel 10/20/2013    LM, LAD & RCA --> CABG     (Not in a hospital admission)  ADDITIONAL HOME MEDICATIONS: No additional home medications  PHYSICIANS INVOLVED IN CARE: Carmie Kanner (primary care), Dr. Ellyn Hack (cardiology)  Past Surgical History  Procedure Laterality Date  . Corneal transplant  2011    right eye  . Total hip arthroplasty  2010, 2011    right 2010, left 2011  . Abdominal hysterectomy    . Coronary artery bypass graft N/A 10/23/2013    Procedure: CORONARY ARTERY BYPASS GRAFTING (CABG);  Surgeon: Rexene Alberts, MD;  Location: [place];  Service: Open Heart Surgery;  Laterality: N/A;  . Intraoperative transesophageal echocardiogram N/A 10/23/2013    Procedure: INTRAOPERATIVE TRANSESOPHAGEAL ECHOCARDIOGRAM;  Surgeon: Rexene Alberts, MD;  Location: [place];  Service: Open Heart Surgery;  Laterality: N/A;  . Clipping of atrial appendage N/A 10/23/2013    Procedure: CLIPPING OF ATRIAL APPENDAGE;  Surgeon: Rexene Alberts, MD;  Location: [place];  Service: Open Heart Surgery;  Laterality: N/A;  . Cardiac catheterization  10/20/2013    Distal LM ~70%, LAD - ostial 70%, prox 80, 95 & 95%; RCA prox 70%, mid 80%; minimal Cx disease  . Coronary artery bypass graft  10/23/2013    LIMA-LAD, SVG-OM2, SVG-RPL    Family History  Problem Relation Age of Onset  . Colon cancer Neg Hx   . Esophageal cancer Neg Hx   .  Rectal cancer Neg Hx   . Stomach cancer Neg Hx      Social History:  reports that she quit smoking about 20 years ago. She has never used smokeless tobacco. She reports that she does not drink alcohol or use illicit drugs.  Allergies:  Allergies  Allergen Reactions  . Allopurinol Hives  . Tape Rash    Adhesive tape     ROS: ankle swelling, arthritis, diabetes, heart attack, heart murmur, high blood pressure, kidney disease, thyroid disease  and Pruritic rash for the past few weeks, dizziness  PHYSICAL EXAM: Blood pressure 103/44, pulse 64, temperature 97.7 F (36.5 C), temperature source Oral, resp. rate 16, height _0  (1.575 m), weight 111.2 kg (245 lb 2.4 oz), SpO2 97.00%. In general, the patient is a moderately overweight woman who was in no apparent distress while lying at 45 elevation head of bed. HEENT exam was within normal limits, neck was supple without jugular venous distention, chest had minimal bibasilar crackles, heart had a regular rate and rhythm with a systolic ejection murmur grade 1/6 the left sternal border, abdomen had normal bowel sounds and no hepatosplenomegaly or tenderness, she had bilateral 2+ ankle and distal leg edema and bilateral 1+ pedal pulses. She was alert and well oriented with normal affect and could move all extremities well.  Results for orders placed during the hospital encounter of 02/02/14 (from the past 48 hour(s))  CBC     Status: Abnormal   Collection Time    02/02/14 12:35 PM      Result Value Ref Range   WBC 9.5  4.0 - 10.5 K/uL   RBC 3.69 (*) 3.87 - 5.11 MIL/uL   Hemoglobin 9.7 (*) 12.0 - 15.0 g/dL   HCT 29.9 (*) 36.0 - 46.0 %   MCV 81.0  78.0 - 100.0 fL   MCH 26.3  26.0 - 34.0 pg   MCHC 32.4  30.0 - 36.0 g/dL   RDW 17.7 (*) 11.5 - 15.5 %   Platelets 317  150 - 400 K/uL  BASIC METABOLIC PANEL     Status: Abnormal   Collection Time    02/02/14 12:35 PM      Result Value Ref Range   Sodium 135 (*) 137 - 147 mEq/L   Potassium 4.7  3.7 - 5.3 mEq/L   Chloride 97  96 - 112 mEq/L   CO2 22  19 - 32 mEq/L   Glucose, Bld 152 (*) 70 - 99 mg/dL   BUN 76 (*) 6 - 23 mg/dL   Creatinine, Ser 4.19 (*) 0.50 - 1.10 mg/dL   Calcium 8.7  8.4 - 10.5 mg/dL   GFR calc non Af Amer 10 (*) >90 mL/min   GFR calc Af Amer 11 (*) >90 mL/min   Comment: (NOTE)     The eGFR has been calculated using the CKD EPI equation.     This calculation has not been validated in all clinical situations.      eGFR's persistently <90 mL/min signify possible Chronic Kidney     Disease.  PRO B NATRIURETIC PEPTIDE     Status: Abnormal   Collection Time    02/02/14 12:35 PM      Result Value Ref Range   Pro B Natriuretic peptide (BNP) 2809.0 (*) 0 - 125 pg/mL  PROTIME-INR     Status: Abnormal   Collection Time    02/02/14 12:35 PM      Result Value Ref Range   Prothrombin Time 26.0 (*)  11.6 - 15.2 seconds   INR 2.48 (*) 0.00 - 1.49  I-STAT TROPOININ, ED     Status: None   Collection Time    02/02/14 12:47 PM      Result Value Ref Range   Troponin i, poc 0.03  0.00 - 0.08 ng/mL   Comment 3            Comment: Due to the release kinetics of cTnI,     a negative result within the first hours     of the onset of symptoms does not rule out     myocardial infarction with certainty.     If myocardial infarction is still suspected,     repeat the test at appropriate intervals.  CBG MONITORING, ED     Status: Abnormal   Collection Time    02/02/14 12:53 PM      Result Value Ref Range   Glucose-Capillary 151 (*) 70 - 99 mg/dL  LACTIC ACID, PLASMA     Status: None   Collection Time    02/02/14  1:50 PM      Result Value Ref Range   Lactic Acid, Venous 1.3  0.5 - 2.2 mmol/L   Dg Chest Portable 1 View  02/02/2014   CLINICAL DATA:  Chest pain with weakness and shortness of breath.  EXAM: PORTABLE CHEST - 1 VIEW  COMPARISON:  01/05/2014.  FINDINGS: Trachea is midline. Heart is at the upper limits of normal in size to mildly enlarged. Lungs are clear. Tiny residual left pleural effusion.  IMPRESSION: Tiny residual left pleural effusion.   Electronically Signed   By: Lorin Picket M.D.   On: 02/02/2014 13:37     Assessment/Plan #1 Acute Renal Failure:  She has chronic renal insufficiency likely from a diabetes mellitus, hypertension, and MI:[id number] vascular disease. Currently she has been treated with moderate dose diuretics but apparently has led to hypotension with hypoperfusion of the kidneys,  and acute renal insufficiency. Other sources of acute renal insufficiency such as interstitial disease, or postobstructive disease are much less likely. We will continue treatment with holding her ACE inhibitor and IV fluids. We'll recheck a BNP test tomorrow morning. #2 CAD: Stable on current medications with no recent anginal symptoms #3 DM2: Stable and we will have her on sliding scale insulin in the hospital #4 Gout: Clinically stable and we will recheck a serum uric acid level on Uloric. #5 Rash: Possibly due to allopurinol or amiodarone. We will continue treatment with Atarax and consider use of low-dose prednisone if atarax is ineffective #6 Near-Syncope:  Most likely from hypotension due to over-diuresis and less likely due to a primary arrhythmia, and we will admit her to a telemetry bed.   Leanna Battles 02/02/2014, 7:24 PM

## 2014-02-02 NOTE — Consult Note (Signed)
is a 73 y.o. female with a past medical history significant for CAD. She had a STEMI in Feb 2015. She ultimately had CABG X 3 10/23/13. She had peri-op PAF and was discharged on Coumadin and Amiodarone. She also had  an EF of 20-25% but that was improved to 40-45% by echo 01/13/14.  Several weeks ago she had her diuretics increased secondary to edema. This apparently caused a gout flair up. Her diuretics were cut back to her usual dose of Lasix 80/40. She then developed a pruritic rash 01/16/14. Her Allopurinol and her Amiodarone were stopped. Her symptoms have persisted but have improved some. Her rash is mainly on her arms and legs.  Today she was in cardiac rehab and became dizzy. Her B/P was noted to be in the 80s and she was sent to the ED. She is in NSR and comfortable when I examined her. Her labs show a BUN of 76 and a SCr of 4.19, Baseline Scr 1.5-1.7.   Past Medical History  Diagnosis Date  . Arthritis   . Heart murmur   . Hyperlipidemia   . Hypertension   . Hypothyroidism   . Ischemic cardiomyopathy 10/20/2013    Echo 2/23: EF 20-25%; mild LVH. anteroseptal Akinesis; mid-apical anterior, inferior & inferoseptal akinesis; apical-lateral akinesis; apical akinesis; Grade 1 D Dysfxn.; Mild-Mod LA dilatoin; mild MR; Mod PHTN  . Acute pulmonary edema with congestive heart failure 10/20/2013    Resolved  . Left bundle branch block (LBBB) on electrocardiogram   . Atrial fibrillation 10/20/2013  . OSTEOARTHRITIS 08/06/2006  . Morbid obesity   . Diabetic neuropathy   . Type II diabetes mellitus with complication 46/96/2952  . Chronic kidney disease   . S/P CABG x 3 10/23/2013    LIMA to LAD, SVG to OM2, SVG to RPLB, EVH via right thigh  . Acute myocardial infarction of anterior wall 10/20/2013    Afib, LBBB  . CAD, multiple vessel 10/20/2013    LM, LAD & RCA --> CABG   Past Surgical History  Procedure Laterality Date  . Corneal transplant  2011    right eye  . Total hip arthroplasty  2010,  2011    right 2010, left 2011  . Abdominal hysterectomy    . Coronary artery bypass graft N/A 10/23/2013    Procedure: CORONARY ARTERY BYPASS GRAFTING (CABG);  Surgeon: Rexene Alberts, MD;  Location: [place];  Service: Open Heart Surgery;  Laterality: N/A;  . Intraoperative transesophageal echocardiogram N/A 10/23/2013    Procedure: INTRAOPERATIVE TRANSESOPHAGEAL ECHOCARDIOGRAM;  Surgeon: Rexene Alberts, MD;  Location: [place];  Service: Open Heart Surgery;  Laterality: N/A;  . Clipping of atrial appendage N/A 10/23/2013    Procedure: CLIPPING OF ATRIAL APPENDAGE;  Surgeon: Rexene Alberts, MD;  Location: [place];  Service: Open Heart Surgery;  Laterality: N/A;  . Cardiac catheterization  10/20/2013    Distal LM ~70%, LAD - ostial 70%, prox 80, 95 & 95%; RCA prox 70%, mid 80%; minimal Cx disease  . Coronary artery bypass graft  10/23/2013    LIMA-LAD, SVG-OM2, SVG-RPL   Social History:  reports that she quit smoking about 20 years ago. She has never used smokeless tobacco. She reports that she does not drink alcohol or use illicit drugs. Allergies:  Allergies  Allergen Reactions  . Allopurinol Hives  . Tape Rash    Adhesive tape   Family History  Problem Relation Age of Onset  . Colon cancer Neg Hx   .  Esophageal cancer Neg Hx   . Rectal cancer Neg Hx   . Stomach cancer Neg Hx     Medications:  Prior to Admission:  Prescriptions prior to admission  Medication Sig Dispense Refill  . Alum & Mag Hydroxide-Simeth (MAGIC MOUTHWASH) SOLN Take 5 mLs by mouth 4 (four) times daily.      [name] aspirin EC 81 MG tablet Take 1 tablet (81 mg total) by mouth daily.      . carvedilol (COREG) 6.25 MG tablet Take 1 tablet (6.25 mg total) by mouth 2 (two) times daily with a meal.  60 tablet  2  . febuxostat (ULORIC) 40 MG tablet Take 40 mg by mouth daily.      . ferrous sulfate 325 (65 FE) MG tablet Take 1 tablet (325 mg total) by mouth daily with breakfast.  30 tablet  3  . furosemide (LASIX) 40 MG tablet  Take 1 tablet (40 mg total) by mouth daily.  30 tablet  2  . hydrOXYzine (ATARAX/VISTARIL) 25 MG tablet Take 25 mg by mouth 3 (three) times daily as needed for itching.      . levothyroxine (SYNTHROID, LEVOTHROID) 112 MCG tablet Take 200 mcg by mouth daily before breakfast.      . lisinopril (PRINIVIL,ZESTRIL) 2.5 MG tablet Take 1 tablet (2.5 mg total) by mouth daily.  30 tablet  1  . oxyCODONE (OXY IR/ROXICODONE) 5 MG immediate release tablet Take 1-2 tablets (5-10 mg total) by mouth every 3 (three) hours as needed for moderate pain.  60 tablet  0  . pantoprazole (PROTONIX) 40 MG tablet Take 1 tablet (40 mg total) by mouth daily.  30 tablet  11  . potassium chloride SA (K-DUR,KLOR-CON) 20 MEQ tablet Take 1 tablet (20 mEq total) by mouth daily.  30 tablet  2  . prednisoLONE acetate (PRED FORTE) 1 % ophthalmic suspension Place 1 drop into the right eye daily.      . rosuvastatin (CRESTOR) 20 MG tablet Take 10 mg by mouth daily.      . sertraline (ZOLOFT) 50 MG tablet Take 1 tablet by mouth daily.      [name] warfarin (COUMADIN) 2.5 MG tablet Take 1.25-2.5 mg by mouth daily. Monday and Friday 1.25 mg, 2.5 mg all other days.        ROS: as per HPI   Blood pressure 103/44, pulse 64, temperature 97.7 F (36.5 C), temperature source Oral, resp. rate 16, height 5' 2" (1.575 m), weight 111.2 kg (245 lb 2.4 oz), SpO2 97.00%.  General appearance: alert and cooperative Head: Normocephalic, without obvious abnormality, atraumatic Eyes: negative Ears: normal TM's and external ear canals both ears Nose: no discharge Resp: clear to auscultation bilaterally Chest wall: no tenderness  Sternotomy healed Cardio: regular rate and rhythm, S1, S2 normal, no murmur, click, rub or gallop GI: soft, non-tender; bowel sounds normal; no masses,  no organomegaly Extremities: extremities normal, atraumatic, no cyanosis or edema Skin: chronic changes in upper arms from afdining rash diffuse Neurologic: Grossly  normal Results for orders placed during the hospital encounter of 02/02/14 (from the past 48 hour(s))  CBC     Status: Abnormal   Collection Time    02/02/14 12:35 PM      Result Value Ref Range   WBC 9.5  4.0 - 10.5 K/uL   RBC 3.69 (*) 3.87 - 5.11 MIL/uL   Hemoglobin 9.7 (*) 12.0 - 15.0 g/dL   HCT 29.9 (*) 36.0 - 46.0 %   MCV 81.0  78.0 - 100.0 fL   MCH 26.3  26.0 - 34.0 pg   MCHC 32.4  30.0 - 36.0 g/dL   RDW 17.7 (*) 11.5 - 15.5 %   Platelets 317  150 - 400 K/uL  BASIC METABOLIC PANEL     Status: Abnormal   Collection Time    02/02/14 12:35 PM      Result Value Ref Range   Sodium 135 (*) 137 - 147 mEq/L   Potassium 4.7  3.7 - 5.3 mEq/L   Chloride 97  96 - 112 mEq/L   CO2 22  19 - 32 mEq/L   Glucose, Bld 152 (*) 70 - 99 mg/dL   BUN 76 (*) 6 - 23 mg/dL   Creatinine, Ser 4.19 (*) 0.50 - 1.10 mg/dL   Calcium 8.7  8.4 - 10.5 mg/dL   GFR calc non Af Amer 10 (*) >90 mL/min   GFR calc Af Amer 11 (*) >90 mL/min   Comment: (NOTE)     The eGFR has been calculated using the CKD EPI equation.     This calculation has not been validated in all clinical situations.     eGFR's persistently <90 mL/min signify possible Chronic Kidney     Disease.  PRO B NATRIURETIC PEPTIDE     Status: Abnormal   Collection Time    02/02/14 12:35 PM      Result Value Ref Range   Pro B Natriuretic peptide (BNP) 2809.0 (*) 0 - 125 pg/mL  PROTIME-INR     Status: Abnormal   Collection Time    02/02/14 12:35 PM      Result Value Ref Range   Prothrombin Time 26.0 (*) 11.6 - 15.2 seconds   INR 2.48 (*) 0.00 - 1.49  I-STAT TROPOININ, ED     Status: None   Collection Time    02/02/14 12:47 PM      Result Value Ref Range   Troponin i, poc 0.03  0.00 - 0.08 ng/mL   Comment 3            Comment: Due to the release kinetics of cTnI,     a negative result within the first hours     of the onset of symptoms does not rule out     myocardial infarction with certainty.     If myocardial infarction is still  suspected,     repeat the test at appropriate intervals.  CBG MONITORING, ED     Status: Abnormal   Collection Time    02/02/14 12:53 PM      Result Value Ref Range   Glucose-Capillary 151 (*) 70 - 99 mg/dL  LACTIC ACID, PLASMA     Status: None   Collection Time    02/02/14  1:50 PM      Result Value Ref Range   Lactic Acid, Venous 1.3  0.5 - 2.2 mmol/L   Dg Chest Portable 1 View  02/02/2014   CLINICAL DATA:  Chest pain with weakness and shortness of breath.  EXAM: PORTABLE CHEST - 1 VIEW  COMPARISON:  01/05/2014.  FINDINGS: Trachea is midline. Heart is at the upper limits of normal in size to mildly enlarged. Lungs are clear. Tiny residual left pleural effusion.  IMPRESSION: Tiny residual left pleural effusion.   Electronically Signed   By: Lorin Picket M.D.   On: 02/02/2014 13:37    Assessment:  1 AIN (due to allopurinol) v Hemodynamically induced AKI  Plan: 1 WBC diff for eos ct,  urine analysis, urine eos, 2 Renal ultrasound 3 Volume expand/resuccutate  Estanislado Emms 02/02/2014, 7:30 PM

## 2014-02-02 NOTE — ED Provider Notes (Signed)
CSN: [id number]     Arrival date & time 02/02/14  1223 History   None    Chief Complaint  Patient presents with  . Near Syncope     (Consider location/radiation/quality/duration/timing/severity/associated sxs/prior Treatment) HPI Comments: Pt with hx of CAD, CHF comes in with dizziness. Pt was seen at the Cardiac rehab today, where she complained of dizziness, and was noted to be hypotensive, and sent to the ER. BP improved in the ER, but still on the lower side. Pt reports change to lasix. Reports no other changes to her meds. No chest pain, dib, leg swelling, DVT or PE hx. Denies any palpitations. No dizziness during my evaluation.  The history is provided by the patient and medical records.    Past Medical History  Diagnosis Date  . Arthritis   . Heart murmur   . Hyperlipidemia   . Hypertension   . Hypothyroidism   . Ischemic cardiomyopathy 10/20/2013    Echo 2/23: EF 20-25%; mild LVH. anteroseptal Akinesis; mid-apical anterior, inferior & inferoseptal akinesis; apical-lateral akinesis; apical akinesis; Grade 1 D Dysfxn.; Mild-Mod LA dilatoin; mild MR; Mod PHTN  . Acute pulmonary edema with congestive heart failure 10/20/2013    Resolved  . Left bundle branch block (LBBB) on electrocardiogram   . Atrial fibrillation 10/20/2013  . OSTEOARTHRITIS 08/06/2006  . Morbid obesity   . Diabetic neuropathy   . Type II diabetes mellitus with complication [id number]  . Chronic kidney disease   . S/P CABG x 3 10/23/2013    LIMA to LAD, SVG to OM2, SVG to RPLB, EVH via right thigh  . Acute myocardial infarction of anterior wall 10/20/2013    Afib, LBBB  . CAD, multiple vessel 10/20/2013    LM, LAD & RCA --> CABG   Past Surgical History  Procedure Laterality Date  . Corneal transplant  2011    right eye  . Total hip arthroplasty  2010, 2011    right 2010, left 2011  . Abdominal hysterectomy    . Coronary artery bypass graft N/A 10/23/2013    Procedure: CORONARY ARTERY BYPASS GRAFTING  (CABG);  Surgeon: Rexene Alberts, MD;  Location: [place];  Service: Open Heart Surgery;  Laterality: N/A;  . Intraoperative transesophageal echocardiogram N/A 10/23/2013    Procedure: INTRAOPERATIVE TRANSESOPHAGEAL ECHOCARDIOGRAM;  Surgeon: Rexene Alberts, MD;  Location: [place];  Service: Open Heart Surgery;  Laterality: N/A;  . Clipping of atrial appendage N/A 10/23/2013    Procedure: CLIPPING OF ATRIAL APPENDAGE;  Surgeon: Rexene Alberts, MD;  Location: [place];  Service: Open Heart Surgery;  Laterality: N/A;  . Cardiac catheterization  10/20/2013    Distal LM ~70%, LAD - ostial 70%, prox 80, 95 & 95%; RCA prox 70%, mid 80%; minimal Cx disease  . Coronary artery bypass graft  10/23/2013    LIMA-LAD, SVG-OM2, SVG-RPL   Family History  Problem Relation Age of Onset  . Colon cancer Neg Hx   . Esophageal cancer Neg Hx   . Rectal cancer Neg Hx   . Stomach cancer Neg Hx    History  Substance Use Topics  . Smoking status: Former Smoker    Quit date: 08/28/1993  . Smokeless tobacco: Never Used  . Alcohol Use: No   OB History   Grav Para Term Preterm Abortions TAB SAB Ect Mult Living                 Review of Systems  Constitutional: Negative for activity change and fatigue.  HENT: Negative for facial swelling.   Respiratory: Negative for cough, shortness of breath and wheezing.   Cardiovascular: Negative for chest pain.  Gastrointestinal: Negative for nausea, vomiting, abdominal pain, diarrhea, constipation, blood in stool and abdominal distention.  Genitourinary: Negative for hematuria and difficulty urinating.  Musculoskeletal: Negative for neck pain.  Skin: Negative for color change.  Neurological: Positive for dizziness. Negative for syncope, facial asymmetry, speech difficulty, weakness, numbness and headaches.  Hematological: Does not bruise/bleed easily.  Psychiatric/Behavioral: Negative for confusion.      Allergies  Allopurinol and Tape  Home Medications   Prior to  Admission medications   Medication Sig Start Date End Date Taking? Authorizing Provider  aspirin EC 81 MG tablet Take 1 tablet (81 mg total) by mouth daily. 11/13/13  Yes Daniel J Angiulli, PA-C  febuxostat (ULORIC) 40 MG tablet Take 40 mg by mouth daily.   Yes Historical Provider, MD  ferrous sulfate 325 (65 FE) MG tablet Take 1 tablet (325 mg total) by mouth daily with breakfast. 11/13/13  Yes Daniel J Angiulli, PA-C  furosemide (LASIX) 40 MG tablet Take 1 tablet (40 mg total) by mouth daily. 01/09/14  Yes Leonie Man, MD  hydrOXYzine (ATARAX/VISTARIL) 25 MG tablet Take 25 mg by mouth 3 (three) times daily as needed for itching.   Yes Historical Provider, MD  oxyCODONE (OXY IR/ROXICODONE) 5 MG immediate release tablet Take 1-2 tablets (5-10 mg total) by mouth every 3 (three) hours as needed for moderate pain. 11/13/13  Yes Daniel J Angiulli, PA-C  pantoprazole (PROTONIX) 40 MG tablet Take 1 tablet (40 mg total) by mouth daily. 01/14/14  Yes Leonie Man, MD  potassium chloride SA (K-DUR,KLOR-CON) 20 MEQ tablet Take 1 tablet (20 mEq total) by mouth daily. 01/09/14  Yes Leonie Man, MD  prednisoLONE acetate (PRED FORTE) 1 % ophthalmic suspension Place 1 drop into the right eye daily.   Yes Historical Provider, MD  rosuvastatin (CRESTOR) 20 MG tablet Take 10 mg by mouth daily.   Yes Historical Provider, MD  sertraline (ZOLOFT) 50 MG tablet Take 1 tablet by mouth daily. 01/02/14  Yes Historical Provider, MD  warfarin (COUMADIN) 2.5 MG tablet Take 1.25-2.5 mg by mouth daily. Monday and Friday 1.25 mg, 2.5 mg all other days.   Yes Historical Provider, MD  levothyroxine (SYNTHROID) 112 MCG tablet Take 1 tablet (112 mcg total) by mouth daily before breakfast. 02/05/14   Leanna Battles, MD   BP 103/55  Pulse 68  Temp(Src) 97.5 F (36.4 C) (Oral)  Resp 17  Ht 5\' 2"  (1.575 m)  Wt 249 lb 9 oz (113.2 kg)  BMI 45.63 kg/m2  SpO2 98% Physical Exam  Nursing note and vitals reviewed. Constitutional:  She is oriented to person, place, and time. She appears well-developed and well-nourished.  HENT:  Head: Normocephalic and atraumatic.  Eyes: EOM are normal. Pupils are equal, round, and reactive to light.  Neck: Neck supple. No JVD present.  Cardiovascular: Normal rate and regular rhythm.   Murmur heard. Pulmonary/Chest: Effort normal. No respiratory distress.  Abdominal: Soft. She exhibits no distension. There is no tenderness. There is no rebound and no guarding.  Musculoskeletal: She exhibits edema.  Neurological: She is alert and oriented to person, place, and time.  Skin: Skin is warm and dry.    ED Course  Procedures (including critical care time) Labs Review Labs Reviewed  CBC - Abnormal; Notable for the following:    RBC 3.69 (*)    Hemoglobin 9.7 (*)  HCT 29.9 (*)    RDW 17.7 (*)    All other components within normal limits  BASIC METABOLIC PANEL - Abnormal; Notable for the following:    Sodium 135 (*)    Glucose, Bld 152 (*)    BUN 76 (*)    Creatinine, Ser 4.19 (*)    GFR calc non Af Amer 10 (*)    GFR calc Af Amer 11 (*)    All other components within normal limits  PRO B NATRIURETIC PEPTIDE - Abnormal; Notable for the following:    Pro B Natriuretic peptide (BNP) 2809.0 (*)    All other components within normal limits  PROTIME-INR - Abnormal; Notable for the following:    Prothrombin Time 26.0 (*)    INR 2.48 (*)    All other components within normal limits  BASIC METABOLIC PANEL - Abnormal; Notable for the following:    Sodium 134 (*)    Glucose, Bld 103 (*)    BUN 76 (*)    Creatinine, Ser 4.16 (*)    GFR calc non Af Amer 10 (*)    GFR calc Af Amer 11 (*)    All other components within normal limits  TSH - Abnormal; Notable for the following:    TSH 0.081 (*)    All other components within normal limits  PROTIME-INR - Abnormal; Notable for the following:    Prothrombin Time 28.8 (*)    INR 2.83 (*)    All other components within normal limits   CBC WITH DIFFERENTIAL - Abnormal; Notable for the following:    RBC 3.29 (*)    Hemoglobin 8.6 (*)    HCT 26.4 (*)    RDW 17.6 (*)    Neutrophils Relative % 41 (*)    Eosinophils Relative 33 (*)    Eosinophils Absolute 2.9 (*)    All other components within normal limits  PRO B NATRIURETIC PEPTIDE - Abnormal; Notable for the following:    Pro B Natriuretic peptide (BNP) 2333.0 (*)    All other components within normal limits  GLUCOSE, CAPILLARY - Abnormal; Notable for the following:    Glucose-Capillary 186 (*)    All other components within normal limits  BASIC METABOLIC PANEL - Abnormal; Notable for the following:    Sodium 136 (*)    BUN 75 (*)    Creatinine, Ser 3.74 (*)    GFR calc non Af Amer 11 (*)    GFR calc Af Amer 13 (*)    All other components within normal limits  PROTIME-INR - Abnormal; Notable for the following:    Prothrombin Time 27.0 (*)    INR 2.61 (*)    All other components within normal limits  IRON AND TIBC - Abnormal; Notable for the following:    TIBC 200 (*)    All other components within normal limits  RETICULOCYTES - Abnormal; Notable for the following:    RBC. 3.24 (*)    All other components within normal limits  GLUCOSE, CAPILLARY - Abnormal; Notable for the following:    Glucose-Capillary 165 (*)    All other components within normal limits  GLUCOSE, CAPILLARY - Abnormal; Notable for the following:    Glucose-Capillary 138 (*)    All other components within normal limits  GLUCOSE, CAPILLARY - Abnormal; Notable for the following:    Glucose-Capillary 106 (*)    All other components within normal limits  GLUCOSE, CAPILLARY - Abnormal; Notable for the following:    Glucose-Capillary 115 (*)  All other components within normal limits  GLUCOSE, CAPILLARY - Abnormal; Notable for the following:    Glucose-Capillary 118 (*)    All other components within normal limits  BASIC METABOLIC PANEL - Abnormal; Notable for the following:    Sodium  136 (*)    CO2 18 (*)    BUN 68 (*)    Creatinine, Ser 3.20 (*)    GFR calc non Af Amer 13 (*)    GFR calc Af Amer 15 (*)    All other components within normal limits  PROTIME-INR - Abnormal; Notable for the following:    Prothrombin Time 23.0 (*)    INR 2.11 (*)    All other components within normal limits  CBC - Abnormal; Notable for the following:    RBC 3.26 (*)    Hemoglobin 8.5 (*)    HCT 26.1 (*)    RDW 17.8 (*)    All other components within normal limits  GLUCOSE, CAPILLARY - Abnormal; Notable for the following:    Glucose-Capillary 129 (*)    All other components within normal limits  GLUCOSE, CAPILLARY - Abnormal; Notable for the following:    Glucose-Capillary 101 (*)    All other components within normal limits  GLUCOSE, CAPILLARY - Abnormal; Notable for the following:    Glucose-Capillary 129 (*)    All other components within normal limits  CBG MONITORING, ED - Abnormal; Notable for the following:    Glucose-Capillary 151 (*)    All other components within normal limits  LACTIC ACID, PLASMA  URINALYSIS, ROUTINE W REFLEX MICROSCOPIC  URIC ACID  GLUCOSE, CAPILLARY  VITAMIN B12  FOLATE  FERRITIN  PROTEIN ELECTROPHORESIS, SERUM  I-STAT TROPOININ, ED  CYTOLOGY - NON PAP    Imaging Review No results found.   EKG Interpretation   Date/Time:  Monday February 02 2014 12:24:12 EDT Ventricular Rate:  65 PR Interval:  270 QRS Duration: 145 QT Interval:  444 QTC Calculation: 462 R Axis:   -76 Text Interpretation:  Sinus rhythm Prolonged PR interval Nonspecific IVCD  with LAD Consider anterior infarct ED PHYSICIAN INTERPRETATION AVAILABLE  IN [facility] Confirmed by TEST, Record ([id number]) on 02/04/2014 7:26:20  AM      MDM   Final diagnoses:  AKI (acute kidney injury)  Syncope, near  CHF (congestive heart failure)    Pt with CHF comes in with dizziness and near syncope. Exam is unremarkable. She is noted to have low BP whilst in the ER. Labs show  AKI. Will admit for the renal failure and near syncope in light of low BP and CHF hx. Unlikely Cardiogenic shock.  Varney Biles, MD 02/05/14 [phone]

## 2014-02-02 NOTE — ED Notes (Signed)
Food tray given to patient 

## 2014-02-03 DIAGNOSIS — N179 Acute kidney failure, unspecified: Principal | ICD-10-CM

## 2014-02-03 LAB — BASIC METABOLIC PANEL
BUN: 76 mg/dL — ABNORMAL HIGH (ref 6–23)
CALCIUM: 8.4 mg/dL (ref 8.4–10.5)
CO2: 21 mEq/L (ref 19–32)
Chloride: 98 mEq/L (ref 96–112)
Creatinine, Ser: 4.16 mg/dL — ABNORMAL HIGH (ref 0.50–1.10)
GFR calc Af Amer: 11 mL/min — ABNORMAL LOW (ref >90)
GFR, EST NON AFRICAN AMERICAN: 10 mL/min — AB (ref >90)
Glucose, Bld: 103 mg/dL — ABNORMAL HIGH (ref 70–99)
Potassium: 4.7 mEq/L (ref 3.7–5.3)
SODIUM: 134 meq/L — AB (ref 137–147)

## 2014-02-03 LAB — CBC WITH DIFFERENTIAL/PLATELET
BASOS ABS: 0.1 10*3/uL (ref 0.0–0.1)
BASOS PCT: 1 % (ref 0–1)
EOS ABS: 2.9 10*3/uL — AB (ref 0.0–0.7)
Eosinophils Relative: 33 % — ABNORMAL HIGH (ref 0–5)
HCT: 26.4 % — ABNORMAL LOW (ref 36.0–46.0)
HEMOGLOBIN: 8.6 g/dL — AB (ref 12.0–15.0)
Lymphocytes Relative: 17 % (ref 12–46)
Lymphs Abs: 1.5 10*3/uL (ref 0.7–4.0)
MCH: 26.1 pg (ref 26.0–34.0)
MCHC: 32.6 g/dL (ref 30.0–36.0)
MCV: 80.2 fL (ref 78.0–100.0)
MONO ABS: 0.7 10*3/uL (ref 0.1–1.0)
Monocytes Relative: 8 % (ref 3–12)
NEUTROS PCT: 41 % — AB (ref 43–77)
Neutro Abs: 3.7 10*3/uL (ref 1.7–7.7)
Platelets: 286 10*3/uL (ref 150–400)
RBC: 3.29 MIL/uL — ABNORMAL LOW (ref 3.87–5.11)
RDW: 17.6 % — ABNORMAL HIGH (ref 11.5–15.5)
WBC: 8.9 10*3/uL (ref 4.0–10.5)

## 2014-02-03 LAB — PRO B NATRIURETIC PEPTIDE: PRO B NATRI PEPTIDE: 2333 pg/mL — AB (ref 0–125)

## 2014-02-03 LAB — PROTIME-INR
INR: 2.83 — AB (ref 0.00–1.49)
Prothrombin Time: 28.8 seconds — ABNORMAL HIGH (ref 11.6–15.2)

## 2014-02-03 LAB — GLUCOSE, CAPILLARY
GLUCOSE-CAPILLARY: 177 mg/dL — AB (ref 70–99)
GLUCOSE-CAPILLARY: 165 mg/dL — AB (ref 70–99)
Glucose-Capillary: 94 mg/dL (ref 70–99)
Glucose-Capillary: 138 mg/dL — ABNORMAL HIGH (ref 70–99)

## 2014-02-03 LAB — TSH: TSH: 0.081 u[IU]/mL — AB (ref 0.350–4.500)

## 2014-02-03 LAB — URIC ACID: Uric Acid, Serum: 3.1 mg/dL (ref 2.4–7.0)

## 2014-02-03 MED ORDER — METOPROLOL TARTRATE 12.5 MG HALF TABLET
12.5000 mg | ORAL_TABLET | Freq: Two times a day (BID) | ORAL | Status: DC | PRN
  Filled 2014-02-03: qty 1

## 2014-02-03 MED ORDER — WARFARIN 1.25 MG HALF TABLET
1.2500 mg | ORAL_TABLET | Freq: Once | ORAL | Status: AC
  Administered 2014-02-03: 1.25 mg via ORAL
  Filled 2014-02-03: qty 1

## 2014-02-03 MED ORDER — LEVOTHYROXINE SODIUM 150 MCG PO TABS
150.0000 ug | ORAL_TABLET | Freq: Every day | ORAL | Status: DC
  Administered 2014-02-04 – 2014-02-05 (×2): 150 ug via ORAL
  Filled 2014-02-03 (×3): qty 1

## 2014-02-03 NOTE — Progress Notes (Signed)
1 AKI, probably hemodynamically induced, less likely allergic interstitial nephritis Plan:  1 supportive TX  Subjective: Interval History: Feels better, less itching  Objective: Vital signs in last 24 hours: Temp:  [97.8 F (36.6 C)-98.8 F (37.1 C)] 98.7 F (37.1 C) (06/09 0947) Pulse Rate:  [59-73] 70 (06/09 0947) Resp:  [11-20] 18 (06/09 0947) BP: (80-123)/(35-93) 93/53 mmHg (06/09 0947) SpO2:  [94 %-100 %] 98 % (06/09 0947) Weight:  [113.309 kg (249 lb 12.8 oz)] 113.309 kg (249 lb 12.8 oz) (06/08 2112) Weight change:   Intake/Output from previous day: 06/08 0701 - 06/09 0700 In: 240 [P.O.:240] Out: -  Intake/Output this shift: Total I/O In: 600 [P.O.:600] Out: -   General appearance: alert and cooperative Resp: clear to auscultation bilaterally Cardio: regular rate and rhythm, S1, S2 normal, no murmur, click, rub or gallop Extremities: edema 1-2+  Lab Results:  Recent Labs  02/02/14 1235 02/03/14 0500  WBC 9.5 8.9  HGB 9.7* 8.6*  HCT 29.9* 26.4*  PLT 317 286   BMET:  Recent Labs  02/02/14 1235 02/03/14 0500  NA 135* 134*  K 4.7 4.7  CL 97 98  CO2 22 21  GLUCOSE 152* 103*  BUN 76* 76*  CREATININE 4.19* 4.16*  CALCIUM 8.7 8.4   No results found for this basename: PTH,  in the last 72 hours Iron Studies: No results found for this basename: IRON, TIBC, TRANSFERRIN, FERRITIN,  in the last 72 hours Studies/Results: US Renal  02/02/2014   CLINICAL DATA:  Acute renal insufficiency  EXAM: RENAL/URINARY TRACT ULTRASOUND COMPLETE  COMPARISON:  None  FINDINGS: Right Kidney:  Length: 9.7 cm. Cortical thinning. Increased cortical echogenicity. No mass, hydronephrosis or shadowing calcification.  Left Kidney:  Length: 9.8 cm. Cortical thinning. Increased cortical echogenicity. No mass, hydronephrosis or shadowing calcification.  Bladder:  Distended pre void with calculated volume a 626 mL and a significant postvoid residual volume of 163 mL. No focal mass or wall  thickening.  IMPRESSION: Atrophic kidneys with medical renal disease changes.  Significant postvoid bladder residual volume.   Electronically Signed   By: Lavonia Dana M.D.   On: 02/02/2014 20:56   Dg Chest Portable 1 View  02/02/2014   CLINICAL DATA:  Chest pain with weakness and shortness of breath.  EXAM: PORTABLE CHEST - 1 VIEW  COMPARISON:  01/05/2014.  FINDINGS: Trachea is midline. Heart is at the upper limits of normal in size to mildly enlarged. Lungs are clear. Tiny residual left pleural effusion.  IMPRESSION: Tiny residual left pleural effusion.   Electronically Signed   By: Lorin Picket M.D.   On: 02/02/2014 13:37   Scheduled: . antiseptic oral rinse  15 mL Mouth Rinse q12n4p  . aspirin EC  81 mg Oral Daily  . atorvastatin  10 mg Oral q1800  . chlorhexidine  15 mL Mouth Rinse BID  . febuxostat  40 mg Oral Daily  . ferrous sulfate  325 mg Oral Q breakfast  . insulin aspart  0-15 Units Subcutaneous TID WC  . insulin aspart  0-5 Units Subcutaneous QHS  . [START ON 02/04/2014] levothyroxine  150 mcg Oral QAC breakfast  . magic mouthwash  5 mL Oral QID  . pantoprazole  40 mg Oral Daily  . prednisoLONE acetate  1 drop Right Eye Daily  . sertraline  50 mg Oral Daily  . warfarin  1.25 mg Oral ONCE-1800  . Warfarin - Pharmacist Dosing Inpatient   Does not apply [phone]  LOS: 1 day   Estanislado Emms 02/03/2014,2:01 PM

## 2014-02-03 NOTE — Progress Notes (Signed)
ANTICOAGULATION CONSULT NOTE - Follow Up Consult  Pharmacy Consult for Warfarin Indication: atrial fibrillation  Allergies  Allergen Reactions  . Allopurinol Hives  . Tape Rash    Adhesive tape    Patient Measurements: Height: 5\' 2"  (157.5 cm) Weight: 249 lb 12.8 oz (113.309 kg) IBW/kg (Calculated) : 50.1  Vital Signs: Temp: 98.7 F (37.1 C) (06/09 0947) Temp src: Oral (06/09 0947) BP: 93/53 mmHg (06/09 0947) Pulse Rate: 70 (06/09 0947)  Labs:  Recent Labs  02/02/14 1235 02/03/14 0500  HGB 9.7* 8.6*  HCT 29.9* 26.4*  PLT 317 286  LABPROT 26.0* 28.8*  INR 2.48* 2.83*  CREATININE 4.19* 4.16*    Estimated Creatinine Clearance: 14.3 ml/min (by C-G formula based on Cr of 4.16).   Assessment: 53 YOF who continues on warfarin for hx Afib with a therapeutic INR this morning (INR 2.83 << 2.48, goal of 2-3). Hgb/Hct/Plt slight drop - likely dilutional with IVF, no bleeding noted.   PTA the patient was taking 2.5 mg daily EXCEPT for 1.25 mg on Mon/Fri.  Goal of Therapy:  INR 2-3   Plan:  1. Warfarin 1.25 mg x 1 dose at 1800 today 2. Will continue to monitor for any signs/symptoms of bleeding and will follow up with PT/INR in the a.m.   Alycia Rossetti, PharmD, BCPS Clinical Pharmacist Pager: [phone] 02/03/2014 10:55 AM

## 2014-02-03 NOTE — Progress Notes (Signed)
Subjective: She feels fine today with no dyspnea, chest pain, or nausea, and ate well for breakfast. Dizziness is improved.  Objective: Vital signs in last 24 hours: Temp:  [97.7 F (36.5 C)-98.8 F (37.1 C)] 98.8 F (37.1 C) (06/09 0500) Pulse Rate:  [59-73] 73 (06/09 0500) Resp:  [11-20] 18 (06/09 0500) BP: (80-123)/(35-93) 84/47 mmHg (06/09 0500) SpO2:  [94 %-100 %] 94 % (06/09 0500) Weight:  [111.2 kg (245 lb 2.4 oz)-113.309 kg (249 lb 12.8 oz)] 113.309 kg (249 lb 12.8 oz) (06/08 2112) Weight change:    Intake/Output from previous day: 06/08 0701 - 06/09 0700 In: 240 [P.O.:240] Out: -    General appearance: alert, cooperative and no distress Resp: clear to auscultation bilaterally Cardio: regular rate and rhythm and with a 1/6 SEM GI: soft, non-tender; bowel sounds normal; no masses,  no organomegaly Extremities: bilateral 2+ distal leg edema  Lab Results:  Recent Labs  02/02/14 1235 02/03/14 0500  WBC 9.5 8.9  HGB 9.7* 8.6*  HCT 29.9* 26.4*  PLT 317 286   BMET  Recent Labs  02/02/14 1235 02/03/14 0500  NA 135* 134*  K 4.7 4.7  CL 97 98  CO2 22 21  GLUCOSE 152* 103*  BUN 76* 76*  CREATININE 4.19* 4.16*  CALCIUM 8.7 8.4   CMET CMP     Component Value Date/Time   NA 134* 02/03/2014 0500   K 4.7 02/03/2014 0500   CL 98 02/03/2014 0500   CO2 21 02/03/2014 0500   GLUCOSE 103* 02/03/2014 0500   BUN 76* 02/03/2014 0500   CREATININE 4.16* 02/03/2014 0500   CALCIUM 8.4 02/03/2014 0500   PROT 5.9* 11/06/2013 0520   ALBUMIN 2.5* 11/06/2013 0520   AST 20 11/06/2013 0520   ALT 36* 11/06/2013 0520   ALKPHOS 64 11/06/2013 0520   BILITOT 0.5 11/06/2013 0520   GFRNONAA 10* 02/03/2014 0500   GFRAA 11* 02/03/2014 0500    CBG (last 3)   Recent Labs  02/02/14 1253 02/02/14 2119 02/03/14 0739  GLUCAP 151* 186* 94    INR RESULTS:   Lab Results  Component Value Date   INR 2.83* 02/03/2014   INR 2.48* 02/02/2014   INR 2.4 01/05/2014     Studies/Results: US  Renal  02/02/2014   CLINICAL DATA:  Acute renal insufficiency  EXAM: RENAL/URINARY TRACT ULTRASOUND COMPLETE  COMPARISON:  None  FINDINGS: Right Kidney:  Length: 9.7 cm. Cortical thinning. Increased cortical echogenicity. No mass, hydronephrosis or shadowing calcification.  Left Kidney:  Length: 9.8 cm. Cortical thinning. Increased cortical echogenicity. No mass, hydronephrosis or shadowing calcification.  Bladder:  Distended pre void with calculated volume a 626 mL and a significant postvoid residual volume of 163 mL. No focal mass or wall thickening.  IMPRESSION: Atrophic kidneys with medical renal disease changes.  Significant postvoid bladder residual volume.   Electronically Signed   By: Lavonia Dana M.D.   On: 02/02/2014 20:56   Dg Chest Portable 1 View  02/02/2014   CLINICAL DATA:  Chest pain with weakness and shortness of breath.  EXAM: PORTABLE CHEST - 1 VIEW  COMPARISON:  01/05/2014.  FINDINGS: Trachea is midline. Heart is at the upper limits of normal in size to mildly enlarged. Lungs are clear. Tiny residual left pleural effusion.  IMPRESSION: Tiny residual left pleural effusion.   Electronically Signed   By: Lorin Picket M.D.   On: 02/02/2014 13:37    Medications: I have reviewed the patient's current medications.  Assessment/Plan: #1 Acute  Renal Insufficiency: stable and creatinine not improved due to continued hypotension and hypoperfusion. Will continue IVF and discontinue coreg and start metoprolol prn pulse over 100. #2 DM2: stable on SSI #3 Near-syncope: stable with no serious arrythmia on telemetry.  #4 Anemia: stable and likely due to CKD. Will check workup labs.  LOS: 1 day   Leanna Battles 02/03/2014, 8:36 AM

## 2014-02-03 NOTE — Progress Notes (Signed)
       Patient Name: SHAUNTRELL SIMENTAL Date of Encounter: 02/03/2014    SUBJECTIVE: Jovial with no complaints this AM. No chest pain or dyspnea. Not dizzy while lying and watching TV.  TELEMETRY:  NSR/SB Filed Vitals:   02/02/14 2045 02/02/14 2112 02/03/14 0500 02/03/14 0947  BP: 87/35 93/56 84/47  93/53  Pulse: 63 67 73 70  Temp:  97.8 F (36.6 C) 98.8 F (37.1 C) 98.7 F (37.1 C)  TempSrc:  Oral Oral Oral  Resp: 15 16 18 18   Height:  5\' 2"  (1.575 m)    Weight:  249 lb 12.8 oz (113.309 kg)    SpO2: 100% 98% 94% 98%    Intake/Output Summary (Last 24 hours) at 02/03/14 1307 Last data filed at 02/03/14 0916  Gross per 24 hour  Intake    600 ml  Output      0 ml  Net    600 ml   LABS: Basic Metabolic Panel:  Recent Labs  02/02/14 1235 02/03/14 0500  NA 135* 134*  K 4.7 4.7  CL 97 98  CO2 22 21  GLUCOSE 152* 103*  BUN 76* 76*  CREATININE 4.19* 4.16*  CALCIUM 8.7 8.4   CBC:  Recent Labs  02/02/14 1235 02/03/14 0500  WBC 9.5 8.9  NEUTROABS  --  3.7  HGB 9.7* 8.6*  HCT 29.9* 26.4*  MCV 81.0 80.2  PLT 317 286    ECHO: 01/13/14 Conclusions  - Left ventricle: Septal and apical hypokinesis The cavity size was mildly dilated. Wall thickness was increased in a pattern of mild LVH. Systolic function was mildly to moderately reduced. The estimated ejection fraction was in the range of 40% to 45%. - Mitral valve: Calcified annulus. Mildly thickened leaflets . - Left atrium: The atrium was moderately dilated. - Atrial septum: No defect or patent foramen ovale was identified.    BNP    Component Value Date/Time   PROBNP 2333.0* 02/03/2014 0500   Radiology/Studies:  Small left pleural effusion  Physical Exam: Blood pressure 93/53, pulse 70, temperature 98.7 F (37.1 C), temperature source Oral, resp. rate 18, height 5\' 2"  (1.575 m), weight 249 lb 12.8 oz (113.309 kg), SpO2 98.00%. Weight change:   Wt Readings from Last 3 Encounters:  02/02/14 249 lb 12.8 oz  (113.309 kg)  01/05/14 238 lb (107.956 kg)  01/05/14 238 lb 9.6 oz (108.228 kg)   Chest clear S4 gallop on auscultation  ASSESSMENT:  1. Acute kidney failure, presumed volume depletion related. Given reaction to allopurinol, interstitial/inflammatory renal impairment would be a consideration( urinalysis seems to speak against glomerular injury) 2. No clinical heart failure currently. 3. Maintaining NSR  Plan:  1. Please let us know if we are needed. Will sign off for now.  Signed, Belva Crome III 02/03/2014, 1:07 PM

## 2014-02-03 NOTE — Progress Notes (Signed)
BP 84/47 this AM. Asymptomatic.  MD notified; obtained verbal order to hold coreg this morning.  Will continue to monitor.

## 2014-02-04 ENCOUNTER — Encounter (HOSPITAL_COMMUNITY): Payer: Self-pay

## 2014-02-04 LAB — RETICULOCYTES
RBC.: 3.24 MIL/uL — AB (ref 3.87–5.11)
RETIC COUNT ABSOLUTE: 74.5 10*3/uL (ref 19.0–186.0)
RETIC CT PCT: 2.3 % (ref 0.4–3.1)

## 2014-02-04 LAB — IRON AND TIBC
Iron: 47 ug/dL (ref 42–135)
SATURATION RATIOS: 24 % (ref 20–55)
TIBC: 200 ug/dL — ABNORMAL LOW (ref 250–470)
UIBC: 153 ug/dL (ref 125–400)

## 2014-02-04 LAB — VITAMIN B12: Vitamin B-12: 377 pg/mL (ref 211–911)

## 2014-02-04 LAB — GLUCOSE, CAPILLARY
GLUCOSE-CAPILLARY: 115 mg/dL — AB (ref 70–99)
GLUCOSE-CAPILLARY: 129 mg/dL — AB (ref 70–99)
Glucose-Capillary: 106 mg/dL — ABNORMAL HIGH (ref 70–99)
Glucose-Capillary: 118 mg/dL — ABNORMAL HIGH (ref 70–99)

## 2014-02-04 LAB — BASIC METABOLIC PANEL
BUN: 75 mg/dL — ABNORMAL HIGH (ref 6–23)
CALCIUM: 8.7 mg/dL (ref 8.4–10.5)
CHLORIDE: 101 meq/L (ref 96–112)
CO2: 19 mEq/L (ref 19–32)
Creatinine, Ser: 3.74 mg/dL — ABNORMAL HIGH (ref 0.50–1.10)
GFR calc non Af Amer: 11 mL/min — ABNORMAL LOW (ref >90)
GFR, EST AFRICAN AMERICAN: 13 mL/min — AB (ref >90)
Glucose, Bld: 96 mg/dL (ref 70–99)
Potassium: 4.9 mEq/L (ref 3.7–5.3)
Sodium: 136 mEq/L — ABNORMAL LOW (ref 137–147)

## 2014-02-04 LAB — PROTIME-INR
INR: 2.61 — ABNORMAL HIGH (ref 0.00–1.49)
PROTHROMBIN TIME: 27 s — AB (ref 11.6–15.2)

## 2014-02-04 LAB — FERRITIN: FERRITIN: 73 ng/mL (ref 10–291)

## 2014-02-04 LAB — FOLATE

## 2014-02-04 MED ORDER — WARFARIN 1.25 MG HALF TABLET
1.2500 mg | ORAL_TABLET | Freq: Once | ORAL | Status: AC
  Administered 2014-02-04: 1.25 mg via ORAL
  Filled 2014-02-04 (×2): qty 1

## 2014-02-04 NOTE — Progress Notes (Signed)
Assess: 1 AKI, probably hemodynamically induced, less likely allergic interstitial nephritis--improving  2 Low BP 3 Anemia Plan:  1 supportive TX, stool OB, f/u Hbg  Subjective: Interval History: none.  Objective: Vital signs in last 24 hours: Temp:  [98.1 F (36.7 C)-98.7 F (37.1 C)] 98.4 F (36.9 C) (06/10 0910) Pulse Rate:  [63-75] 75 (06/10 0910) Resp:  [18-20] 20 (06/10 0910) BP: (84-109)/(37-69) 84/37 mmHg (06/10 0910) SpO2:  [93 %-99 %] 99 % (06/10 0910) Weight:  [113.2 kg (249 lb 9 oz)] 113.2 kg (249 lb 9 oz) (06/09 2100) Weight change: 2 kg (4 lb 6.6 oz)  Intake/Output from previous day: 06/09 0701 - 06/10 0700 In: 600 [P.O.:600] Out: -  Intake/Output this shift:    General appearance: alert and cooperative Resp: clear to auscultation bilaterally Chest wall: no tenderness Cardio: regular rate and rhythm, S1, S2 normal, no murmur, click, rub or gallop Extremities: edema 1+  Lab Results:  Recent Labs  02/02/14 1235 02/03/14 0500  WBC 9.5 8.9  HGB 9.7* 8.6*  HCT 29.9* 26.4*  PLT 317 286   BMET:  Recent Labs  02/03/14 0500 02/04/14 0700  NA 134* 136*  K 4.7 4.9  CL 98 101  CO2 21 19  GLUCOSE 103* 96  BUN 76* 75*  CREATININE 4.16* 3.74*  CALCIUM 8.4 8.7   No results found for this basename: PTH,  in the last 72 hours Iron Studies: No results found for this basename: IRON, TIBC, TRANSFERRIN, FERRITIN,  in the last 72 hours Studies/Results: US Renal  02/02/2014   CLINICAL DATA:  Acute renal insufficiency  EXAM: RENAL/URINARY TRACT ULTRASOUND COMPLETE  COMPARISON:  None  FINDINGS: Right Kidney:  Length: 9.7 cm. Cortical thinning. Increased cortical echogenicity. No mass, hydronephrosis or shadowing calcification.  Left Kidney:  Length: 9.8 cm. Cortical thinning. Increased cortical echogenicity. No mass, hydronephrosis or shadowing calcification.  Bladder:  Distended pre void with calculated volume a 626 mL and a significant postvoid residual volume of  163 mL. No focal mass or wall thickening.  IMPRESSION: Atrophic kidneys with medical renal disease changes.  Significant postvoid bladder residual volume.   Electronically Signed   By: Lavonia Dana M.D.   On: 02/02/2014 20:56   Dg Chest Portable 1 View  02/02/2014   CLINICAL DATA:  Chest pain with weakness and shortness of breath.  EXAM: PORTABLE CHEST - 1 VIEW  COMPARISON:  01/05/2014.  FINDINGS: Trachea is midline. Heart is at the upper limits of normal in size to mildly enlarged. Lungs are clear. Tiny residual left pleural effusion.  IMPRESSION: Tiny residual left pleural effusion.   Electronically Signed   By: Lorin Picket M.D.   On: 02/02/2014 13:37   Scheduled: . antiseptic oral rinse  15 mL Mouth Rinse q12n4p  . aspirin EC  81 mg Oral Daily  . atorvastatin  10 mg Oral q1800  . chlorhexidine  15 mL Mouth Rinse BID  . febuxostat  40 mg Oral Daily  . ferrous sulfate  325 mg Oral Q breakfast  . insulin aspart  0-15 Units Subcutaneous TID WC  . insulin aspart  0-5 Units Subcutaneous QHS  . levothyroxine  150 mcg Oral QAC breakfast  . magic mouthwash  5 mL Oral QID  . pantoprazole  40 mg Oral Daily  . prednisoLONE acetate  1 drop Right Eye Daily  . sertraline  50 mg Oral Daily  . warfarin  1.25 mg Oral ONCE-1800  . Warfarin - Pharmacist Dosing Inpatient  Does not apply q1800     LOS: 2 days   Markeesha Char C 02/04/2014,9:19 AM

## 2014-02-04 NOTE — Progress Notes (Signed)
ANTICOAGULATION CONSULT NOTE - Follow Up Consult  Pharmacy Consult for Warfarin Indication: atrial fibrillation  Allergies  Allergen Reactions  . Allopurinol Hives  . Tape Rash    Adhesive tape    Patient Measurements: Height: 5\' 2"  (157.5 cm) Weight: 249 lb 9 oz (113.2 kg) IBW/kg (Calculated) : 50.1  Vital Signs: Temp: 98.1 F (36.7 C) (06/10 0500) Temp src: Oral (06/10 0500) BP: 94/55 mmHg (06/10 0500) Pulse Rate: 75 (06/10 0500)  Labs:  Recent Labs  02/02/14 1235 02/03/14 0500 02/04/14 0700  HGB 9.7* 8.6*  --   HCT 29.9* 26.4*  --   PLT 317 286  --   LABPROT 26.0* 28.8* 27.0*  INR 2.48* 2.83* 2.61*  CREATININE 4.19* 4.16* 3.74*    Estimated Creatinine Clearance: 15.9 ml/min (by C-G formula based on Cr of 3.74).   Assessment: 33 yoF who continues on warfarin for hx Afib with a therapeutic INR 2.61 this morning (INR 2.48 on admit, goal of 2-3). No new Hgb/Hct/Plt since yesterday, Hgb 8.6, Plts 286 likely dilutional with IVF, no bleeding noted.   PTA the patient was taking 2.5 mg daily EXCEPT for 1.25 mg on Mon/Fri.  Goal of Therapy:  INR 2-3   Plan:  1. Will maintain on Warfarin 1.25 mg x 1 dose at 1800 today  2. Will continue to monitor for any signs/symptoms of bleeding and will follow up with PT/INR in the a.m.   Oval Linsey. Leitha Schuller, PharmD Clinical Pharmacist - Resident Phone: [phone] Pager: [phone] 02/04/2014 8:51 AM

## 2014-02-04 NOTE — Telephone Encounter (Signed)
Reorder

## 2014-02-04 NOTE — Progress Notes (Signed)
Subjective: She feels fine with no dyspnea lying fully supine, eating well, no leg pain  Objective: Vital signs in last 24 hours: Temp:  [98.1 F (36.7 C)-98.7 F (37.1 C)] 98.1 F (36.7 C) (06/10 0500) Pulse Rate:  [63-75] 75 (06/10 0500) Resp:  [18] 18 (06/10 0500) BP: (93-109)/(47-69) 94/55 mmHg (06/10 0500) SpO2:  [93 %-98 %] 98 % (06/10 0500) Weight:  [113.2 kg (249 lb 9 oz)] 113.2 kg (249 lb 9 oz) (06/09 2100) Weight change: 2 kg (4 lb 6.6 oz)   Intake/Output from previous day: 06/09 0701 - 06/10 0700 In: 600 [P.O.:600] Out: -    General appearance: alert, cooperative and no distress Resp: clear to auscultation bilaterally Cardio: regular rate and rhythm GI: soft, non-tender; bowel sounds normal; no masses,  no organomegaly Extremities: bilateral 2+ distal leg edema  Lab Results:  Recent Labs  02/02/14 1235 02/03/14 0500  WBC 9.5 8.9  HGB 9.7* 8.6*  HCT 29.9* 26.4*  PLT 317 286   BMET  Recent Labs  02/02/14 1235 02/03/14 0500  NA 135* 134*  K 4.7 4.7  CL 97 98  CO2 22 21  GLUCOSE 152* 103*  BUN 76* 76*  CREATININE 4.19* 4.16*  CALCIUM 8.7 8.4   CMET CMP     Component Value Date/Time   NA 134* 02/03/2014 0500   K 4.7 02/03/2014 0500   CL 98 02/03/2014 0500   CO2 21 02/03/2014 0500   GLUCOSE 103* 02/03/2014 0500   BUN 76* 02/03/2014 0500   CREATININE 4.16* 02/03/2014 0500   CALCIUM 8.4 02/03/2014 0500   PROT 5.9* 11/06/2013 0520   ALBUMIN 2.5* 11/06/2013 0520   AST 20 11/06/2013 0520   ALT 36* 11/06/2013 0520   ALKPHOS 64 11/06/2013 0520   BILITOT 0.5 11/06/2013 0520   GFRNONAA 10* 02/03/2014 0500   GFRAA 11* 02/03/2014 0500    CBG (last 3)   Recent Labs  02/03/14 0739 02/03/14 1720 02/03/14 2208  GLUCAP 94 165* 138*    INR RESULTS:   Lab Results  Component Value Date   INR 2.83* 02/03/2014   INR 2.48* 02/02/2014   INR 2.4 01/05/2014     Studies/Results: US Renal  02/02/2014   CLINICAL DATA:  Acute renal insufficiency  EXAM: RENAL/URINARY TRACT  ULTRASOUND COMPLETE  COMPARISON:  None  FINDINGS: Right Kidney:  Length: 9.7 cm. Cortical thinning. Increased cortical echogenicity. No mass, hydronephrosis or shadowing calcification.  Left Kidney:  Length: 9.8 cm. Cortical thinning. Increased cortical echogenicity. No mass, hydronephrosis or shadowing calcification.  Bladder:  Distended pre void with calculated volume a 626 mL and a significant postvoid residual volume of 163 mL. No focal mass or wall thickening.  IMPRESSION: Atrophic kidneys with medical renal disease changes.  Significant postvoid bladder residual volume.   Electronically Signed   By: Lavonia Dana M.D.   On: 02/02/2014 20:56   Dg Chest Portable 1 View  02/02/2014   CLINICAL DATA:  Chest pain with weakness and shortness of breath.  EXAM: PORTABLE CHEST - 1 VIEW  COMPARISON:  01/05/2014.  FINDINGS: Trachea is midline. Heart is at the upper limits of normal in size to mildly enlarged. Lungs are clear. Tiny residual left pleural effusion.  IMPRESSION: Tiny residual left pleural effusion.   Electronically Signed   By: Lorin Picket M.D.   On: 02/02/2014 13:37    Medications: I have reviewed the patient's current medications.  Assessment/Plan: #1 Acute Kidney Failure: stable on IVF and decreased BP meds, will  recheck BUN and creatinine today, likely discharge in 24-48 hours depending on renal function. #2 DM2: stable on SSI #3 CAD: stable on metoprolol prn   LOS: 2 days   Kaoir Loree G 02/04/2014, 7:29 AM

## 2014-02-04 NOTE — Telephone Encounter (Signed)
Tape added as allergy

## 2014-02-05 DIAGNOSIS — E872 Acidosis, unspecified: Secondary | ICD-10-CM | POA: Diagnosis present

## 2014-02-05 LAB — BASIC METABOLIC PANEL
BUN: 68 mg/dL — AB (ref 6–23)
CO2: 18 meq/L — AB (ref 19–32)
CREATININE: 3.2 mg/dL — AB (ref 0.50–1.10)
Calcium: 8.8 mg/dL (ref 8.4–10.5)
Chloride: 102 mEq/L (ref 96–112)
GFR calc Af Amer: 15 mL/min — ABNORMAL LOW (ref >90)
GFR calc non Af Amer: 13 mL/min — ABNORMAL LOW (ref >90)
Glucose, Bld: 94 mg/dL (ref 70–99)
POTASSIUM: 4.9 meq/L (ref 3.7–5.3)
Sodium: 136 mEq/L — ABNORMAL LOW (ref 137–147)

## 2014-02-05 LAB — CBC
HCT: 26.1 % — ABNORMAL LOW (ref 36.0–46.0)
HEMOGLOBIN: 8.5 g/dL — AB (ref 12.0–15.0)
MCH: 26.1 pg (ref 26.0–34.0)
MCHC: 32.6 g/dL (ref 30.0–36.0)
MCV: 80.1 fL (ref 78.0–100.0)
Platelets: 293 10*3/uL (ref 150–400)
RBC: 3.26 MIL/uL — AB (ref 3.87–5.11)
RDW: 17.8 % — ABNORMAL HIGH (ref 11.5–15.5)
WBC: 10.1 10*3/uL (ref 4.0–10.5)

## 2014-02-05 LAB — GLUCOSE, CAPILLARY
GLUCOSE-CAPILLARY: 101 mg/dL — AB (ref 70–99)
Glucose-Capillary: 129 mg/dL — ABNORMAL HIGH (ref 70–99)

## 2014-02-05 LAB — PROTIME-INR
INR: 2.11 — ABNORMAL HIGH (ref 0.00–1.49)
Prothrombin Time: 23 seconds — ABNORMAL HIGH (ref 11.6–15.2)

## 2014-02-05 MED ORDER — SODIUM CHLORIDE 0.9 % IV SOLN
510.0000 mg | Freq: Once | INTRAVENOUS | Status: AC
  Administered 2014-02-05: 510 mg via INTRAVENOUS
  Filled 2014-02-05: qty 17

## 2014-02-05 MED ORDER — LEVOTHYROXINE SODIUM 112 MCG PO TABS: 112.0000 ug | ORAL_TABLET | Freq: Every day | ORAL | Status: AC

## 2014-02-05 MED ORDER — WARFARIN SODIUM 2.5 MG PO TABS
2.5000 mg | ORAL_TABLET | Freq: Once | ORAL | Status: DC
  Filled 2014-02-05: qty 1

## 2014-02-05 NOTE — Progress Notes (Signed)
ANTICOAGULATION CONSULT NOTE - Follow Up Consult  Pharmacy Consult for Warfarin Indication: atrial fibrillation  Allergies  Allergen Reactions  . Allopurinol Hives  . Tape Rash    Uncoded Allergy. Allergen: Tape Adhesive tape    Patient Measurements: Height: 5\' 2"  (157.5 cm) Weight: 249 lb 9 oz (113.2 kg) IBW/kg (Calculated) : 50.1  Vital Signs: Temp: 98.6 F (37 C) (06/11 0500) Temp src: Oral (06/11 0500) BP: 105/51 mmHg (06/11 0500) Pulse Rate: 75 (06/11 0500)  Labs:  Recent Labs  02/02/14 1235 02/03/14 0500 02/04/14 0700 02/05/14 0620  HGB 9.7* 8.6*  --  8.5*  HCT 29.9* 26.4*  --  26.1*  PLT 317 286  --  293  LABPROT 26.0* 28.8* 27.0* 23.0*  INR 2.48* 2.83* 2.61* 2.11*  CREATININE 4.19* 4.16* 3.74* 3.20*    Estimated Creatinine Clearance: 18.6 ml/min (by C-G formula based on Cr of 3.2).   Assessment: 14 yoF who continues on warfarin for hx Afib with a therapeutic INR 2.61>2.11 this morning (INR 2.48 on admit, goal of 2-3). Hgb 8.5, Plts 293. No bleeding noted.   PTA the patient was taking 2.5 mg daily EXCEPT for 1.25 mg on Mon/Fri.  Goal of Therapy:  INR 2-3   Plan:  1. Warfarin 2.5 mg x 1 dose at 1800 today  2. Will continue to monitor for any signs/symptoms of bleeding and will follow up with PT/INR in the a.m.   Oval Linsey. Leitha Schuller, PharmD Clinical Pharmacist - Resident Phone: [phone] Pager: [phone] 02/05/2014 7:53 AM

## 2014-02-05 NOTE — Progress Notes (Signed)
Assess:  1 AKI, probably hemodynamically induced, less likely allergic interstitial nephritis--improving  2 Low BP  3 Anemia with iron deficiency Plan:  1 supportive TX, IV iron  Subjective: Interval History: Feels better  Objective: Vital signs in last 24 hours: Temp:  [97.6 F (36.4 C)-98.6 F (37 C)] 98.6 F (37 C) (06/11 0500) Pulse Rate:  [67-85] 75 (06/11 0500) Resp:  [18-24] 18 (06/11 0500) BP: (105-111)/(51-63) 105/51 mmHg (06/11 0500) SpO2:  [97 %-100 %] 100 % (06/11 0500) Weight change:   Intake/Output from previous day: 06/10 0701 - 06/11 0700 In: 5162.5 [P.O.:720; I.V.:4442.5] Out: -  Intake/Output this shift:    General appearance: alert Resp: clear to auscultation bilaterally Chest wall: no tenderness Cardio: regular rate and rhythm, S1, S2 normal, no murmur, click, rub or gallop Extremities: edema !-2+  Lab Results:  Recent Labs  02/03/14 0500 02/05/14 0620  WBC 8.9 10.1  HGB 8.6* 8.5*  HCT 26.4* 26.1*  PLT 286 293   BMET:  Recent Labs  02/04/14 0700 02/05/14 0620  NA 136* 136*  K 4.9 4.9  CL 101 102  CO2 19 18*  GLUCOSE 96 94  BUN 75* 68*  CREATININE 3.74* 3.20*  CALCIUM 8.7 8.8   No results found for this basename: PTH,  in the last 72 hours Iron Studies:  Recent Labs  02/04/14 0700  IRON 47  TIBC 200*  FERRITIN 73   Studies/Results: No results found.  Scheduled: . antiseptic oral rinse  15 mL Mouth Rinse q12n4p  . aspirin EC  81 mg Oral Daily  . atorvastatin  10 mg Oral q1800  . chlorhexidine  15 mL Mouth Rinse BID  . febuxostat  40 mg Oral Daily  . ferrous sulfate  325 mg Oral Q breakfast  . insulin aspart  0-15 Units Subcutaneous TID WC  . insulin aspart  0-5 Units Subcutaneous QHS  . levothyroxine  150 mcg Oral QAC breakfast  . magic mouthwash  5 mL Oral QID  . pantoprazole  40 mg Oral Daily  . prednisoLONE acetate  1 drop Right Eye Daily  . sertraline  50 mg Oral Daily  . warfarin  2.5 mg Oral ONCE-1800  .  Warfarin - Pharmacist Dosing Inpatient   Does not apply q1800     LOS: 3 days   Elaine [name] C 02/05/2014,9:14 AM

## 2014-02-05 NOTE — Discharge Summary (Signed)
Physician Discharge Summary  Patient ID: Elaine [name] MRN: [id number] DOB/AGE: July 24, 1941 73 y.o.  Admit date: 02/02/2014 Discharge date: 02/05/2014   Discharge Diagnoses:  Principal Problem:   Acute kidney failure Active Problems:   Metabolic acidosis   Pruritus   Type II diabetes mellitus with complication   HYPERLIPIDEMIA   Obesity   HYPERTENSION- now hypotensive   PAF- periop CABG   Left bundle branch block (LBBB) on electrocardiogram   ICM EF by Echo 20-25% Feb 2015, recently improved to 40-45% by echo 01/13/14   Morbid obesity- BMI 44   Diabetic neuropathy   Chronic kidney disease, stage III (moderate)   S/P CABG x 3- 10/23/13   Coumadin Rx post CABG   Acute on chronic renal insufficiency   Near syncope   Hypotension   Rash   Discharged Condition: good  Hospital Course: Patient is a 73 year old woman of African American descent who was admitted from cardiac rehabilitation because of extremely low blood pressure in the [id number] systolic range. She has been recently treated with higher dose Lasix because of fluid retention in the face of moderately severe renal insufficiency. A consultation with nephrology was initiated last week with plans for first visit within the next month. She had been doing daily body weights and had not noted significant weight loss recently. She had been feeling somewhat woozy lately. Her food and fluid consumption has not changed. She has not been having fever, chills, nausea, vomiting, diarrhea, or frequency. When seen earlier today by a cardiologist she was in a sinus rhythm it was recommended that her lisinopril be stopped and IV fluids be given. Since initial evaluation in the emergency room she notes some decreased dizziness and fatigue. Her medical history is most significant for diabetes mellitus, type II, hypertension, coronary artery disease with ischemic cardiomyopathy, and paroxysmal atrial fibrillation. In May 2015 she had a transthoracic  echocardiogram that showed left ventricular ejection fraction of 40-45% with left atrial enlargement and normal right ventricular systolic function.  She had a renal ultrasound study done which showed bilateral atrophic kidneys with medical renal disease, no hydronephrosis, and increased post void residual of 163 mL.  She is also followed closely by consultants from cardiology and nephrology.  Medications were reduced in the face of moderate hypotension and acute renal insufficiency to include the discontinuation of lisinopril and carvedilol, and metoprolol was added when necessary pulse greater than 100 and not used.  With IV fluids and adjustment of medication her renal function improved significantly. Laboratory studies to reevaluate her anemia were sent with full results pending at the time of dictation. Throughout her stay she did not have problems with difficulty breathing, chest pain, or appetite.  There were no complications from her hospitalization.   Consults: cardiology and nephrology  Significant Diagnostic Studies:  No results found.  Labs: Lab Results  Component Value Date   WBC 10.1 02/05/2014   HGB 8.5* 02/05/2014   HCT 26.1* 02/05/2014   MCV 80.1 02/05/2014   PLT 293 02/05/2014     Recent Labs Lab 02/05/14 0620  NA 136*  K 4.9  CL 102  CO2 18*  BUN 68*  CREATININE 3.20*  CALCIUM 8.8  GLUCOSE 94       Lab Results  Component Value Date   INR 2.11* 02/05/2014   INR 2.61* 02/04/2014   INR 2.83* 02/03/2014     No results found for this or any previous visit (from the past 240 hour(s)).    Discharge Exam:  Blood pressure 103/55, pulse 68, temperature 97.5 F (36.4 C), temperature source Oral, resp. rate 17, height 5\' 2"  (1.575 m), weight 113.2 kg (249 lb 9 oz), SpO2 98.00%.  Physical Exam: In general, she is an overweight woman of African-American descent who was in no apparent distress.  HEENT exam was within normal limits, neck was supple without jugular venous  distention, chest was clear to auscultation, heart had a regular rate and rhythm, abdomen had normal bowel sounds and no tenderness, she had bilateral 2+ leg edema to the knees.  Neurological exam is nonfocal.  Disposition:  she will be discharged from the hospital to go home in the company of family members.  Discharge Instructions   Call MD for:    Complete by:  As directed   Call for fever, chills, worsening difficulty breathing, significant dizziness, chest pain, or other concerning symptoms.     Diet - low sodium heart healthy    Complete by:  As directed      Discharge instructions    Complete by:  As directed   Check body weights once daily and call us if weight increases or decreases 3 pounds from todays weight. Obtain knee high compression hose to wear during the day and remove at bedtime. Do not add salt to food.     Increase activity slowly    Complete by:  As directed             Medication List    STOP taking these medications       carvedilol 6.25 MG tablet  Commonly known as:  COREG     lisinopril 2.5 MG tablet  Commonly known as:  PRINIVIL,ZESTRIL     magic mouthwash Soln      TAKE these medications       aspirin EC 81 MG tablet  Take 1 tablet (81 mg total) by mouth daily.     febuxostat 40 MG tablet  Commonly known as:  ULORIC  Take 40 mg by mouth daily.     ferrous sulfate 325 (65 FE) MG tablet  Take 1 tablet (325 mg total) by mouth daily with breakfast.     furosemide 40 MG tablet  Commonly known as:  LASIX  Take 1 tablet (40 mg total) by mouth daily.     hydrOXYzine 25 MG tablet  Commonly known as:  ATARAX/VISTARIL  Take 25 mg by mouth 3 (three) times daily as needed for itching.     levothyroxine 112 MCG tablet  Commonly known as:  SYNTHROID  Take 1 tablet (112 mcg total) by mouth daily before breakfast.     oxyCODONE 5 MG immediate release tablet  Commonly known as:  Oxy IR/ROXICODONE  Take 1-2 tablets (5-10 mg total) by mouth every 3  (three) hours as needed for moderate pain.     pantoprazole 40 MG tablet  Commonly known as:  PROTONIX  Take 1 tablet (40 mg total) by mouth daily.     potassium chloride SA 20 MEQ tablet  Commonly known as:  K-DUR,KLOR-CON  Take 1 tablet (20 mEq total) by mouth daily.     prednisoLONE acetate 1 % ophthalmic suspension  Commonly known as:  PRED FORTE  Place 1 drop into the right eye daily.     rosuvastatin 20 MG tablet  Commonly known as:  CRESTOR  Take 10 mg by mouth daily.     sertraline 50 MG tablet  Commonly known as:  ZOLOFT  Take 1 tablet by mouth  daily.     warfarin 2.5 MG tablet  Commonly known as:  COUMADIN  Take 1.25-2.5 mg by mouth daily. Monday and Friday 1.25 mg, 2.5 mg all other days.           Follow-up Information   Follow up with Marton Redwood, MD. Schedule an appointment as soon as possible for a visit in 1 week.   Specialty:  Internal Medicine   Contact information:   [place] [address] [phone]       Signed: Donnajean Lopes 02/05/2014, 1:33 PM

## 2014-02-05 NOTE — ED Provider Notes (Signed)
Medical screening examination/treatment/procedure(s) were conducted as a shared visit with non-physician practitioner(s) and myself.  I personally evaluated the patient during the encounter.   EKG Interpretation   Date/Time:  Monday February 02 2014 12:24:12 EDT Ventricular Rate:  65 PR Interval:  270 QRS Duration: 145 QT Interval:  444 QTC Calculation: 462 R Axis:   -76 Text Interpretation:  Sinus rhythm Prolonged PR interval Nonspecific IVCD  with LAD Consider anterior infarct ED PHYSICIAN INTERPRETATION AVAILABLE  IN [facility] Confirmed by TEST, Record ([id number]) on 02/04/2014 7:26:20  AM     Pt comes in with near syncope and hypotension. Pressures better in the ER. Neuro and cardiac exam benign. Pt has AKI and given her advanced CHF, Cards was consulted - and they feel comfortable being consultants and having medicine to admit. Stable for admission at this time.  Dx: Near Syncope Acute kidney injury (AKI) CHF exacerbation  Varney Biles, MD 02/05/14 0128

## 2014-02-05 NOTE — Progress Notes (Signed)
Discharge documentation reviewed with pt allowing time for questions. Pt verbalized understanding. IV removed without issue. Pt leaving unit via wheelchair by Tech.

## 2014-02-06 ENCOUNTER — Encounter (HOSPITAL_COMMUNITY): Payer: Self-pay

## 2014-02-06 LAB — PROTEIN ELECTROPHORESIS, SERUM
ALPHA-1-GLOBULIN: 7.2 % — AB (ref 2.9–4.9)
Albumin ELP: 49.8 % — ABNORMAL LOW (ref 55.8–66.1)
Alpha-2-Globulin: 14 % — ABNORMAL HIGH (ref 7.1–11.8)
BETA 2: 5.7 % (ref 3.2–6.5)
Beta Globulin: 5.2 % (ref 4.7–7.2)
GAMMA GLOBULIN: 18.1 % (ref 11.1–18.8)
M-Spike, %: NOT DETECTED g/dL
Total Protein ELP: 5.2 g/dL — ABNORMAL LOW (ref 6.0–8.3)

## 2014-02-09 ENCOUNTER — Encounter (HOSPITAL_COMMUNITY): Admission: RE | Admit: 2014-02-09 | Payer: Self-pay | Source: Ambulatory Visit

## 2014-02-11 ENCOUNTER — Encounter (HOSPITAL_COMMUNITY): Admission: RE | Admit: 2014-02-11 | Payer: Self-pay | Source: Ambulatory Visit

## 2014-02-11 ENCOUNTER — Ambulatory Visit: Payer: Medicare HMO | Admitting: Cardiology

## 2014-02-13 ENCOUNTER — Encounter (HOSPITAL_COMMUNITY): Admission: RE | Admit: 2014-02-13 | Payer: Self-pay | Source: Ambulatory Visit

## 2014-02-16 ENCOUNTER — Other Ambulatory Visit: Payer: Self-pay | Admitting: Cardiology

## 2014-02-16 ENCOUNTER — Encounter (HOSPITAL_COMMUNITY): Payer: Self-pay

## 2014-02-18 ENCOUNTER — Other Ambulatory Visit: Payer: Self-pay | Admitting: *Deleted

## 2014-02-18 ENCOUNTER — Encounter (HOSPITAL_COMMUNITY): Payer: Self-pay

## 2014-02-20 ENCOUNTER — Encounter (HOSPITAL_COMMUNITY): Payer: Self-pay

## 2014-02-23 ENCOUNTER — Encounter (HOSPITAL_COMMUNITY): Payer: Self-pay

## 2014-02-25 ENCOUNTER — Encounter (HOSPITAL_COMMUNITY): Payer: Medicare HMO | Attending: Cardiology

## 2014-02-25 DIAGNOSIS — I2109 ST elevation (STEMI) myocardial infarction involving other coronary artery of anterior wall: Secondary | ICD-10-CM | POA: Insufficient documentation

## 2014-02-25 DIAGNOSIS — I447 Left bundle-branch block, unspecified: Secondary | ICD-10-CM | POA: Insufficient documentation

## 2014-02-25 DIAGNOSIS — I501 Left ventricular failure: Secondary | ICD-10-CM | POA: Insufficient documentation

## 2014-02-25 DIAGNOSIS — I4891 Unspecified atrial fibrillation: Secondary | ICD-10-CM | POA: Insufficient documentation

## 2014-02-25 DIAGNOSIS — I2510 Atherosclerotic heart disease of native coronary artery without angina pectoris: Secondary | ICD-10-CM | POA: Insufficient documentation

## 2014-02-25 DIAGNOSIS — Z951 Presence of aortocoronary bypass graft: Secondary | ICD-10-CM | POA: Insufficient documentation

## 2014-02-25 DIAGNOSIS — I2589 Other forms of chronic ischemic heart disease: Secondary | ICD-10-CM | POA: Insufficient documentation

## 2014-02-25 DIAGNOSIS — Z5189 Encounter for other specified aftercare: Secondary | ICD-10-CM | POA: Insufficient documentation

## 2014-02-25 MED ORDER — PANTOPRAZOLE SODIUM 40 MG PO TBEC
40.0000 mg | DELAYED_RELEASE_TABLET | Freq: Every day | ORAL | Status: DC
Start: ? — End: 2014-04-13

## 2014-03-02 ENCOUNTER — Encounter (HOSPITAL_COMMUNITY): Payer: Self-pay

## 2014-03-04 ENCOUNTER — Encounter (HOSPITAL_COMMUNITY): Payer: Self-pay

## 2014-03-06 ENCOUNTER — Encounter (HOSPITAL_COMMUNITY): Payer: Self-pay

## 2014-03-09 ENCOUNTER — Ambulatory Visit (INDEPENDENT_AMBULATORY_CARE_PROVIDER_SITE_OTHER): Payer: Medicare HMO | Admitting: Pharmacist

## 2014-03-09 ENCOUNTER — Encounter (HOSPITAL_COMMUNITY): Payer: Self-pay

## 2014-03-09 DIAGNOSIS — I480 Paroxysmal atrial fibrillation: Secondary | ICD-10-CM

## 2014-03-09 DIAGNOSIS — I4891 Unspecified atrial fibrillation: Secondary | ICD-10-CM

## 2014-03-09 DIAGNOSIS — Z7901 Long term (current) use of anticoagulants: Secondary | ICD-10-CM

## 2014-03-09 LAB — POCT INR: INR: 2

## 2014-03-11 ENCOUNTER — Encounter (HOSPITAL_COMMUNITY): Payer: Self-pay

## 2014-03-13 ENCOUNTER — Encounter (HOSPITAL_COMMUNITY): Payer: Self-pay

## 2014-03-16 ENCOUNTER — Encounter (HOSPITAL_COMMUNITY): Payer: Self-pay

## 2014-03-18 ENCOUNTER — Encounter (HOSPITAL_COMMUNITY): Payer: Self-pay

## 2014-03-20 ENCOUNTER — Other Ambulatory Visit: Payer: Self-pay

## 2014-03-20 ENCOUNTER — Encounter (HOSPITAL_COMMUNITY): Payer: Self-pay

## 2014-03-20 MED ORDER — FERROUS SULFATE 325 (65 FE) MG PO TABS: 325.0000 mg | ORAL_TABLET | Freq: Every day | ORAL | Status: DC

## 2014-03-20 NOTE — Telephone Encounter (Signed)
Rx was sent to pharmacy electronically. 

## 2014-03-20 NOTE — Addendum Note (Signed)
Addended by: Diana Eves on: 03/20/2014 05:54 PM   Modules accepted: Orders

## 2014-03-23 ENCOUNTER — Encounter (HOSPITAL_COMMUNITY): Payer: Self-pay

## 2014-03-25 ENCOUNTER — Encounter (HOSPITAL_COMMUNITY): Payer: Self-pay

## 2014-03-26 ENCOUNTER — Other Ambulatory Visit (HOSPITAL_COMMUNITY): Payer: Self-pay | Admitting: Respiratory Therapy

## 2014-03-26 DIAGNOSIS — R0600 Dyspnea, unspecified: Secondary | ICD-10-CM

## 2014-03-27 ENCOUNTER — Encounter (HOSPITAL_COMMUNITY): Payer: Self-pay

## 2014-03-30 ENCOUNTER — Encounter (HOSPITAL_COMMUNITY): Payer: Medicare HMO | Attending: Cardiology

## 2014-03-30 DIAGNOSIS — I501 Left ventricular failure: Secondary | ICD-10-CM | POA: Insufficient documentation

## 2014-03-30 DIAGNOSIS — I447 Left bundle-branch block, unspecified: Secondary | ICD-10-CM | POA: Insufficient documentation

## 2014-03-30 DIAGNOSIS — I2589 Other forms of chronic ischemic heart disease: Secondary | ICD-10-CM | POA: Insufficient documentation

## 2014-03-30 DIAGNOSIS — I2510 Atherosclerotic heart disease of native coronary artery without angina pectoris: Secondary | ICD-10-CM | POA: Insufficient documentation

## 2014-03-30 DIAGNOSIS — I2109 ST elevation (STEMI) myocardial infarction involving other coronary artery of anterior wall: Secondary | ICD-10-CM | POA: Insufficient documentation

## 2014-03-30 DIAGNOSIS — I4891 Unspecified atrial fibrillation: Secondary | ICD-10-CM | POA: Insufficient documentation

## 2014-03-30 DIAGNOSIS — Z5189 Encounter for other specified aftercare: Secondary | ICD-10-CM | POA: Insufficient documentation

## 2014-03-30 DIAGNOSIS — Z951 Presence of aortocoronary bypass graft: Secondary | ICD-10-CM | POA: Insufficient documentation

## 2014-04-01 ENCOUNTER — Encounter (HOSPITAL_COMMUNITY): Payer: Self-pay

## 2014-04-03 ENCOUNTER — Encounter (HOSPITAL_COMMUNITY): Payer: Self-pay

## 2014-04-03 ENCOUNTER — Ambulatory Visit (HOSPITAL_COMMUNITY)
Admission: RE | Admit: 2014-04-03 | Discharge: 2014-04-03 | Disposition: A | Discharge status: Home / Self Care | Payer: Medicare HMO | Source: Ambulatory Visit | Attending: Internal Medicine | Admitting: Internal Medicine

## 2014-04-03 DIAGNOSIS — R0989 Other specified symptoms and signs involving the circulatory and respiratory systems: Secondary | ICD-10-CM | POA: Diagnosis present

## 2014-04-03 DIAGNOSIS — R0609 Other forms of dyspnea: Secondary | ICD-10-CM | POA: Diagnosis not present

## 2014-04-03 MED ORDER — ALBUTEROL SULFATE (2.5 MG/3ML) 0.083% IN NEBU
2.5000 mg | INHALATION_SOLUTION | Freq: Once | RESPIRATORY_TRACT | Status: AC
  Administered 2014-04-03: 2.5 mg via RESPIRATORY_TRACT

## 2014-04-06 ENCOUNTER — Encounter (HOSPITAL_COMMUNITY): Payer: Self-pay

## 2014-04-06 ENCOUNTER — Ambulatory Visit (INDEPENDENT_AMBULATORY_CARE_PROVIDER_SITE_OTHER): Payer: Medicare HMO | Admitting: Pharmacist Clinician (PhC)/ Clinical Pharmacy Specialist

## 2014-04-06 DIAGNOSIS — Z7901 Long term (current) use of anticoagulants: Secondary | ICD-10-CM

## 2014-04-06 DIAGNOSIS — I480 Paroxysmal atrial fibrillation: Secondary | ICD-10-CM

## 2014-04-06 DIAGNOSIS — I4891 Unspecified atrial fibrillation: Secondary | ICD-10-CM

## 2014-04-06 LAB — POCT INR: INR: 1.2

## 2014-04-08 ENCOUNTER — Encounter (HOSPITAL_COMMUNITY): Payer: Self-pay

## 2014-04-08 ENCOUNTER — Other Ambulatory Visit: Payer: Self-pay | Admitting: Pharmacist Clinician (PhC)/ Clinical Pharmacy Specialist

## 2014-04-08 MED ORDER — WARFARIN SODIUM 2.5 MG PO TABS: ORAL_TABLET | ORAL | Status: DC

## 2014-04-10 ENCOUNTER — Encounter (HOSPITAL_COMMUNITY): Payer: Self-pay

## 2014-04-10 LAB — PULMONARY FUNCTION TEST
DL/VA % pred: 90 %
DL/VA: 4.1 ml/min/mmHg/L
DLCO COR % PRED: 62 %
DLCO COR: 13.44 ml/min/mmHg
DLCO UNC % PRED: 62 %
DLCO unc: 13.44 ml/min/mmHg
FEF 25-75 Post: 1.65 L/sec
FEF 25-75 Pre: 0.72 L/sec
FEF2575-%Change-Post: 127 %
FEF2575-%PRED-POST: 115 %
FEF2575-%PRED-PRE: 50 %
FEV1-%Change-Post: 23 %
FEV1-%PRED-POST: 90 %
FEV1-%Pred-Pre: 73 %
FEV1-PRE: 1.15 L
FEV1-Post: 1.43 L
FEV1FVC-%Change-Post: 6 %
FEV1FVC-%PRED-PRE: 90 %
FEV6-%CHANGE-POST: 16 %
FEV6-%Pred-Post: 99 %
FEV6-%Pred-Pre: 85 %
FEV6-POST: 1.93 L
FEV6-Pre: 1.66 L
FEV6FVC-%PRED-POST: 104 %
FEV6FVC-%Pred-Pre: 104 %
FVC-%Change-Post: 16 %
FVC-%PRED-POST: 95 %
FVC-%Pred-Pre: 81 %
FVC-Post: 1.93 L
FVC-Pre: 1.66 L
PRE FEV1/FVC RATIO: 70 %
PRE FEV6/FVC RATIO: 100 %
Post FEV1/FVC ratio: 74 %
Post FEV6/FVC ratio: 100 %
RV % pred: 113 %
RV: 2.45 L
TLC % pred: 94 %
TLC: 4.48 L

## 2014-04-13 ENCOUNTER — Encounter (HOSPITAL_COMMUNITY): Payer: Self-pay

## 2014-04-13 ENCOUNTER — Encounter: Payer: Self-pay | Admitting: Cardiology

## 2014-04-13 ENCOUNTER — Ambulatory Visit (INDEPENDENT_AMBULATORY_CARE_PROVIDER_SITE_OTHER): Payer: Medicare HMO | Admitting: Cardiology

## 2014-04-13 ENCOUNTER — Ambulatory Visit (INDEPENDENT_AMBULATORY_CARE_PROVIDER_SITE_OTHER): Payer: Medicare HMO | Admitting: Pharmacist Clinician (PhC)/ Clinical Pharmacy Specialist

## 2014-04-13 VITALS — BP 122/62 | HR 65 | Ht 62.0 in | Wt 253.5 lb

## 2014-04-13 DIAGNOSIS — I4891 Unspecified atrial fibrillation: Secondary | ICD-10-CM

## 2014-04-13 DIAGNOSIS — I480 Paroxysmal atrial fibrillation: Secondary | ICD-10-CM

## 2014-04-13 DIAGNOSIS — R0600 Dyspnea, unspecified: Secondary | ICD-10-CM

## 2014-04-13 DIAGNOSIS — R0989 Other specified symptoms and signs involving the circulatory and respiratory systems: Secondary | ICD-10-CM

## 2014-04-13 DIAGNOSIS — R5381 Other malaise: Secondary | ICD-10-CM

## 2014-04-13 DIAGNOSIS — R0602 Shortness of breath: Secondary | ICD-10-CM

## 2014-04-13 DIAGNOSIS — I2589 Other forms of chronic ischemic heart disease: Secondary | ICD-10-CM

## 2014-04-13 DIAGNOSIS — E8779 Other fluid overload: Secondary | ICD-10-CM

## 2014-04-13 DIAGNOSIS — E785 Hyperlipidemia, unspecified: Secondary | ICD-10-CM

## 2014-04-13 DIAGNOSIS — I10 Essential (primary) hypertension: Secondary | ICD-10-CM

## 2014-04-13 DIAGNOSIS — N289 Disorder of kidney and ureter, unspecified: Secondary | ICD-10-CM

## 2014-04-13 DIAGNOSIS — I2510 Atherosclerotic heart disease of native coronary artery without angina pectoris: Secondary | ICD-10-CM

## 2014-04-13 DIAGNOSIS — R0609 Other forms of dyspnea: Secondary | ICD-10-CM

## 2014-04-13 DIAGNOSIS — I255 Ischemic cardiomyopathy: Secondary | ICD-10-CM

## 2014-04-13 DIAGNOSIS — Z7901 Long term (current) use of anticoagulants: Secondary | ICD-10-CM

## 2014-04-13 DIAGNOSIS — N189 Chronic kidney disease, unspecified: Secondary | ICD-10-CM

## 2014-04-13 LAB — POCT INR: INR: 1.3

## 2014-04-13 NOTE — Patient Instructions (Signed)
Your physician recommends that you schedule a follow-up appointment in 4-69months with Dr.Harding

## 2014-04-15 ENCOUNTER — Encounter (HOSPITAL_COMMUNITY): Payer: Self-pay

## 2014-04-17 ENCOUNTER — Encounter (HOSPITAL_COMMUNITY): Payer: Self-pay

## 2014-04-19 DIAGNOSIS — I2510 Atherosclerotic heart disease of native coronary artery without angina pectoris: Secondary | ICD-10-CM | POA: Insufficient documentation

## 2014-04-19 NOTE — Progress Notes (Signed)
PATIENT: Elaine [name] MRN: [id number]  DOB: 09-Feb-1941   DOV:04/19/2014 PCP: Marton Redwood, MD  Clinic Note: Chief Complaint  Patient presents with  . 3 MONTH VISIT    DOE, NO CHEST PAIN, EDEMA--SAW Dr Florene Glen TODAY, DR SHAW 2 WEEKS AGO    HPI: Elaine [name] is a 73 y.o.  female with a PMH below who presents today for her third post CABG followup. She saw Cecilie Kicks, NP-C. In the end of March as her initial visit. She is a very pleasant woman with a past medical history of hypertension, diabetes mellitus type 2 and former but tobacco abuse who was admitted on 10/21/2011 with an acute MI -- she actually presented in A. Fib with RVR with a left bundle branch block. Her symptoms of profound chest pressure heaviness and edema with rest or distress was concerning enough to call code STEMI. I took her to the cardiac catheterization lab where she was found to have moderate left main disease with high-grade proximal stenosis in the LAD with essentially 3 vessel disease. She did rule in for an MI and was found to be in significant acute systolic/diastolicheart failure related to both her A. Fib and MI. After initial stabilization on amiodarone and diuresis, she was taken to the OR by Dr. Roxy Manns on the 26th of February for her 3 vessel CABG and clipping of left atrial appendage. She was discharged on warfarin for PAF. She went for a short-term to inpatient rehabilitation and was discharged in the end of March. She did have diabetes counseling. She had lower extremity edema, and he added a diuretic and counseled on low-salt diet. She is known to have ischemic cardiomyopathy with EF of 20-25% in the setting of her acute MI and A. Fib.  She was doing relatively well when I saw her back in May. She had a repeat echo that showed significant improvement in EF up to 40-45%. She is no longer on amiodarone.  Interval History: She presents today, initially not complaining of much, but after discussion she really notices  significant exertional dyspnea that is notably improved when she uses her bronchodilators. She has a chronic cough productive of yellowish sputum. Her PCP had her recheck PFTs. Thankfully her amiodarone has been discontinued as there would be a potential concern for amiodarone toxicity. She has not had any of her anginal chest pressure symptoms but has had assistant shortness of breath that seems to have predated her CABG -- she occasionally has a chest pressure sensation at night when she lies down but this is helped out notably when she takes her nebulizers and sits in an air-conditioned/cool room.[name]  Unfortunately, she has not really been able to go to cardiac rehabilitation as planned, because she has been having to care for her husband is doing quite poorly and has been seeing her nephrologist for renal failure that seems to be doing better. Her nephrologist is dramatically increased her diuretic for her lower extremity edema which seems to improve it. He is not wearing any compression stockings.   She sleeps chronically on 2 pillows, and has not noticed this worsening. No rapid or irregular heartbeats that she can tell.  She denies anyTIA/amaurosis fugax or syncope/near syncope symptoms. She was discharged on warfarin, and denies any melena, hematochezia, hematuria or epistaxis. She denies any claudication type symptoms. She has noted more pronounced and frequent wheezing and coughing - notably improved with her nebulizers to a much more dramatic extent and inhalers.   Past  Medical History  Diagnosis Date  . Arthritis   . Mild mitral regurgitation by prior echocardiogram     Mild-Mod MR on Echo  . Hyperlipidemia   . Hypertension   . Hypothyroidism   . Ischemic cardiomyopathy - Notable Improvement in EF post CABG 10/20/2013    Echo 2/23: EF 20-25%; mild LVH. anteroseptal Akinesis; mid-apical anterior, inferior & inferoseptal + apical-lateral akinesis; G 1 DDysfxn.; Mild-Mod LA dil; mild MR; Mod  PHTN;; F/u Echo 12/2013: EF 40-45%, septal & apical HK, mild LVH; Mod LA dilation  . Acute pulmonary edema with congestive heart failure 10/20/2013    Resolved  . Left bundle branch block (LBBB) on electrocardiogram   . Atrial fibrillation 10/20/2013    On Warfarin  . OSTEOARTHRITIS 08/06/2006  . Morbid obesity   . Diabetic neuropathy   . Type II diabetes mellitus with complication [id number]  . Chronic kidney disease   . S/P CABG x 3 10/23/2013    LIMA to LAD, SVG to OM2, SVG to RPLB, EVH via right thigh  . Acute myocardial infarction of anterior wall 10/20/2013    Afib, LBBB  . CAD, multiple vessel 10/20/2013    LM, LAD & RCA --> CABG    Prior Cardiac Evaluation and Past Surgical History: Past Surgical History  Procedure Laterality Date  . Corneal transplant  2011    right eye  . Total hip arthroplasty  2010, 2011    right 2010, left 2011  . Abdominal hysterectomy    . Coronary artery bypass graft N/A 10/23/2013    Procedure: CORONARY ARTERY BYPASS GRAFTING (CABG);  Surgeon: Rexene Alberts, MD;  Location: [facility];  Service: Open Heart Surgery;  Laterality: N/A;  . Intraoperative transesophageal echocardiogram N/A 10/23/2013    Procedure: INTRAOPERATIVE TRANSESOPHAGEAL ECHOCARDIOGRAM;  Surgeon: Rexene Alberts, MD;  Location: [facility];  Service: Open Heart Surgery;  Laterality: N/A;  . Clipping of atrial appendage N/A 10/23/2013    Procedure: CLIPPING OF ATRIAL APPENDAGE;  Surgeon: Rexene Alberts, MD;  Location: [facility];  Service: Open Heart Surgery;  Laterality: N/A;  . Cardiac catheterization  10/20/2013    Distal LM ~70%, LAD - ostial 70%, prox 80, 95 & 95%; RCA prox 70%, mid 80%; minimal Cx disease  . Coronary artery bypass graft  10/23/2013    LIMA-LAD, SVG-OM2, SVG-RPL    Allergies  Allergen Reactions  . Allopurinol Hives  . Tape Rash    Uncoded Allergy. Allergen: Tape Adhesive tape    Current Outpatient Prescriptions  Medication Sig Dispense Refill  . aspirin EC 81 MG tablet  Take 1 tablet (81 mg total) by mouth daily.      . febuxostat (ULORIC) 40 MG tablet Take 40 mg by mouth daily.      . ferrous sulfate 325 (65 FE) MG tablet Take 1 tablet (325 mg total) by mouth daily with breakfast.  30 tablet  10  . furosemide (LASIX) 40 MG tablet Take 80 mg by mouth 2 (two) times daily.       [name] ipratropium-albuterol (DUONEB) 0.5-2.5 (3) MG/3ML SOLN       . levothyroxine (SYNTHROID) 112 MCG tablet Take 1 tablet (112 mcg total) by mouth daily before breakfast.  30 tablet  12  . oxycodone (OXY-IR) 5 MG capsule TAKE 1-2 TABLETS (5-10 MG TOTAL) BY MOUTH EVERY 3 (THREE) HOURS AS NEEDED FOR MODERATE PAIN      . pantoprazole (PROTONIX) 40 MG tablet Take 1 tablet (40 mg total) by mouth daily.  30 tablet  11  . prednisoLONE acetate (PRED FORTE) 1 % ophthalmic suspension Place 1 drop into the right eye daily.      . rosuvastatin (CRESTOR) 20 MG tablet Take 10 mg by mouth daily.      . sertraline (ZOLOFT) 50 MG tablet Take 1 tablet by mouth daily.      [name] warfarin (COUMADIN) 2.5 MG tablet Take 1 tablet by mouth daily or as directed by coumadin clinic  30 tablet  2  . carvedilol (COREG) 6.25 MG tablet        No current facility-administered medications for this visit.    History   Social History Narrative   Lives with husband and son in [place].  She is currently retired.   Former smoker quit in 1995.   ROS: A comprehensive Review of Systems -was performed Review of Systems  Constitutional: Negative for fever and chills.  HENT: Positive for congestion.   Eyes: Negative.   Respiratory: Positive for cough, sputum production, shortness of breath and wheezing. Negative for hemoptysis.        Thick yellowish sputum  Cardiovascular: Positive for chest pain, orthopnea, leg swelling and PND. Negative for palpitations and claudication.       Stable two-pillow orthopnea  Gastrointestinal: Negative.  Negative for blood in stool and melena.  Genitourinary: Negative.  Negative for dysuria  and hematuria.  Musculoskeletal: Positive for joint pain.  Neurological: Negative.  Negative for dizziness, tingling, tremors, sensory change, speech change, focal weakness, seizures and loss of consciousness.  All other systems reviewed and are negative.  PHYSICAL EXAM BP 122/62  Pulse 65  Ht 5\' 2"  (1.575 m)  Wt 253 lb 8 oz (114.987 kg)  BMI 46.35 kg/m2 General appearance: alert, cooperative, appears stated age, no distress, morbidly obese and pleasant mood and affect - but much more emotional today ("a bit about her conflict between caring for her husband and caring for herself. He does not do well with air conditioning and she does, he does understand that she also has health problems; she often feels like she needs her own space to take care of herself.).  Answers questions appropriately HEENT: Mariano Colon/AT, MMM, anicteric sclera; the right eye appears somewhat collapsed over. She has had a corneal transplant and has mild lateral drift with reduced abduction Neck: no adenopathy, no carotid bruit, no JVD and supple, symmetrical, trachea midline Lungs: Mild interstitial sounds bilaterally with faint expiratory wheeze, normal percussion bilaterally and nonlabored, good air movement Heart: regular rate and rhythm, S1, S2 normal, no murmur, click, rub or gallop and normal apical impulse Abdomen: soft, non-tender; bowel sounds normal; no masses,  no organomegaly Extremities: no ulcers, gangrene or trophic changes and ~ 1-2+ bilateral lower extremity edema. Pulses: 2+ and symmetric Skin: Skin color, texture, turgor normal. No rashes or lesions Neurologic: Grossly normal   Adult ECG Report - NSR with 1 AVB, LAD/LBBB pattern of IVCD; stable  Recent Labs: none since discharge  ASSESSMENT / PLAN: Atherosclerotic heart disease of native coronary artery without angina pectoris Status post CABG. She does not seem to be having her anginal symptom, however the nighttime chest pressure is somewhat  concerning. The fact it is improved with a nebulizer and cool air is relatively reassuring it is probably more pulmonary issue than cardiac. She is on carvedilol 6.25 mg twice a day which could be switched to equivalent dose of metoprolol succinate or bisoprolol if deemed necessary by her PCP or pulmonary medicine. She is on aspirin and  statin.  Not currently on ACE inhibitor/ARB. May want to consider ARB if blood pressure were to increase.  Low threshold for Myoview stress test if symptoms of dyspnea as well as chest pressure worsen as her presentation with MI was a bit complicated and difficult to tease out her true symptoms.  SOB (shortness of breath) Definitely some component of deconditioning. She is hoping to get into the maintenance program of cardiac rehabilitation. Clearly there was a component of pulmonary disease that is being treated by her PCP. She may very well have some diastolic and mild systolic heart failure complement that I suspect this contributing.   She is on standing dose of Lasix that has been controlled by her nephrologist. Hopefully with more bone removal her nocturnal symptoms will improve.  PAF- periop CABG As far as I can tell. No further episodes of A. fib. She is now no longer on amiodarone, continue beta blocker. Anticoagulated with warfarin prophylactically. No bleeding complications  Hyperlipidemia with target LDL less than 70 Last set of lipids were from February. Relatively well-controlled. Monitored by PCP. As her screening #s look to be at goal, recommend NMR panel for next evaluation to delineate her complete profile.  HYPERTENSION- now hypotensive Stable BP.  Will defer somewhat to Nephrology, but if BP were to increase & renal function is stable, would like to use ARB (if possible) & if not Hydralazine/Isordil (Bidil) for afterload reduction.   Physical deconditioning Will need referral to Maintenance Cardiac Rehab.  Severe obesity (BMI >= 40) This  will be a difficult target - I discussed need for dietary adjustment & increased exercise activity.  Refer to [facility] Maintenance program if possible.  Acute on chronic renal insufficiency Being followed by Dr. Florene Glen who is managing her diuretic and now blood pressure.  Volume overload Again discussed the importance of low-salt diet. Defer diuretic to Dr. Florene Glen from nephrology    Orders Placed This Encounter  Procedures  . EKG 12-Lead   Meds ordered this encounter  Medications  . furosemide (LASIX) 40 MG tablet    Sig: Take 80 mg by mouth 2 (two) times daily.   [name] ipratropium-albuterol (DUONEB) 0.5-2.5 (3) MG/3ML SOLN    Sig:   . carvedilol (COREG) 6.25 MG tablet    Sig:   . oxycodone (OXY-IR) 5 MG capsule    Sig: TAKE 1-2 TABLETS (5-10 MG TOTAL) BY MOUTH EVERY 3 (THREE) HOURS AS NEEDED FOR MODERATE PAIN    Followup: 3 months  DAVID W. Ellyn Hack, M.D., M.S. Interventional Cardiology [facility]

## 2014-04-19 NOTE — Assessment & Plan Note (Signed)
Again discussed the importance of low-salt diet. Defer diuretic to Dr. Florene Glen from nephrology

## 2014-04-19 NOTE — Assessment & Plan Note (Addendum)
Status post CABG. She does not seem to be having her anginal symptom, however the nighttime chest pressure is somewhat concerning. The fact it is improved with a nebulizer and cool air is relatively reassuring it is probably more pulmonary issue than cardiac. She is on carvedilol 6.25 mg twice a day which could be switched to equivalent dose of metoprolol succinate or bisoprolol if deemed necessary by her PCP or pulmonary medicine. She is on aspirin and statin.  Not currently on ACE inhibitor/ARB. May want to consider ARB if blood pressure were to increase.  Low threshold for Myoview stress test if symptoms of dyspnea as well as chest pressure worsen as her presentation with MI was a bit complicated and difficult to tease out her true symptoms.

## 2014-04-19 NOTE — Assessment & Plan Note (Signed)
This will be a difficult target - I discussed need for dietary adjustment & increased exercise activity.  Refer to [facility] Maintenance program if possible.

## 2014-04-19 NOTE — Assessment & Plan Note (Signed)
Will need referral to Maintenance Cardiac Rehab.

## 2014-04-19 NOTE — Assessment & Plan Note (Signed)
As far as I can tell. No further episodes of A. fib. She is now no longer on amiodarone, continue beta blocker. Anticoagulated with warfarin prophylactically. No bleeding complications

## 2014-04-19 NOTE — Assessment & Plan Note (Addendum)
Definitely some component of deconditioning. She is hoping to get into the maintenance program of cardiac rehabilitation. Clearly there was a component of pulmonary disease that is being treated by her PCP. She may very well have some diastolic and mild systolic heart failure complement that I suspect this contributing.   She is on standing dose of Lasix that has been controlled by her nephrologist. Hopefully with more bone removal her nocturnal symptoms will improve.

## 2014-04-19 NOTE — Assessment & Plan Note (Signed)
Being followed by Dr. Florene Glen who is managing her diuretic and now blood pressure.

## 2014-04-19 NOTE — Assessment & Plan Note (Signed)
Stable BP.  Will defer somewhat to Nephrology, but if BP were to increase & renal function is stable, would like to use ARB (if possible) & if not Hydralazine/Isordil (Bidil) for afterload reduction.

## 2014-04-19 NOTE — Assessment & Plan Note (Signed)
Last set of lipids were from February. Relatively well-controlled. Monitored by PCP. As her screening #s look to be at goal, recommend NMR panel for next evaluation to delineate her complete profile.

## 2014-04-20 ENCOUNTER — Encounter (HOSPITAL_COMMUNITY): Payer: Self-pay

## 2014-04-21 ENCOUNTER — Telehealth (HOSPITAL_COMMUNITY): Payer: Self-pay | Admitting: *Deleted

## 2014-04-21 ENCOUNTER — Other Ambulatory Visit: Payer: Self-pay | Admitting: Cardiology

## 2014-04-21 NOTE — Telephone Encounter (Signed)
Rx was sent to pharmacy electronically. 

## 2014-04-22 ENCOUNTER — Encounter (HOSPITAL_COMMUNITY): Payer: Self-pay

## 2014-04-24 ENCOUNTER — Encounter (HOSPITAL_COMMUNITY): Payer: Self-pay

## 2014-04-27 ENCOUNTER — Encounter (HOSPITAL_COMMUNITY): Admission: RE | Admit: 2014-04-27 | Payer: Self-pay | Source: Ambulatory Visit

## 2014-04-28 ENCOUNTER — Telehealth (HOSPITAL_COMMUNITY): Payer: Self-pay | Admitting: *Deleted

## 2014-04-29 ENCOUNTER — Encounter (HOSPITAL_COMMUNITY): Payer: Self-pay

## 2014-05-01 ENCOUNTER — Encounter (HOSPITAL_COMMUNITY): Payer: Self-pay

## 2014-05-06 ENCOUNTER — Encounter (HOSPITAL_COMMUNITY): Payer: Self-pay

## 2014-05-08 ENCOUNTER — Ambulatory Visit (INDEPENDENT_AMBULATORY_CARE_PROVIDER_SITE_OTHER): Payer: Medicare HMO | Admitting: Pharmacist Clinician (PhC)/ Clinical Pharmacy Specialist

## 2014-05-08 ENCOUNTER — Encounter (HOSPITAL_COMMUNITY): Payer: Self-pay

## 2014-05-08 ENCOUNTER — Other Ambulatory Visit: Payer: Self-pay | Admitting: Pharmacist Clinician (PhC)/ Clinical Pharmacy Specialist

## 2014-05-08 DIAGNOSIS — I480 Paroxysmal atrial fibrillation: Secondary | ICD-10-CM

## 2014-05-08 DIAGNOSIS — I4891 Unspecified atrial fibrillation: Secondary | ICD-10-CM

## 2014-05-08 DIAGNOSIS — Z7901 Long term (current) use of anticoagulants: Secondary | ICD-10-CM

## 2014-05-08 MED ORDER — WARFARIN SODIUM 2.5 MG PO TABS: ORAL_TABLET | ORAL | Status: DC

## 2014-05-11 ENCOUNTER — Encounter (HOSPITAL_COMMUNITY): Payer: Self-pay

## 2014-05-13 ENCOUNTER — Encounter (HOSPITAL_COMMUNITY): Payer: Self-pay

## 2014-05-15 ENCOUNTER — Telehealth: Payer: Self-pay | Admitting: Cardiology

## 2014-05-15 ENCOUNTER — Encounter (HOSPITAL_COMMUNITY): Payer: Self-pay

## 2014-05-15 NOTE — Telephone Encounter (Signed)
Returned call to patient. Informed her that paperwork is not yet complete but that she will be notified when it is complete and ready for pick up

## 2014-05-15 NOTE — Telephone Encounter (Signed)
Pt called in stating that she dropped off some insurance paperwork for [facility] last week and she would like to know if those papers were ready for pickup today. Please call  Thanks

## 2014-05-18 ENCOUNTER — Encounter (HOSPITAL_COMMUNITY): Payer: Self-pay

## 2014-05-20 ENCOUNTER — Encounter (HOSPITAL_COMMUNITY): Payer: Self-pay

## 2014-05-22 ENCOUNTER — Other Ambulatory Visit (HOSPITAL_COMMUNITY): Payer: Self-pay | Admitting: Internal Medicine

## 2014-05-22 ENCOUNTER — Ambulatory Visit (INDEPENDENT_AMBULATORY_CARE_PROVIDER_SITE_OTHER): Payer: Medicare HMO | Admitting: Pharmacist Clinician (PhC)/ Clinical Pharmacy Specialist

## 2014-05-22 ENCOUNTER — Encounter (HOSPITAL_COMMUNITY): Payer: Self-pay

## 2014-05-22 DIAGNOSIS — I480 Paroxysmal atrial fibrillation: Secondary | ICD-10-CM

## 2014-05-22 DIAGNOSIS — Z1231 Encounter for screening mammogram for malignant neoplasm of breast: Secondary | ICD-10-CM

## 2014-05-22 DIAGNOSIS — Z7901 Long term (current) use of anticoagulants: Secondary | ICD-10-CM

## 2014-05-22 DIAGNOSIS — I4891 Unspecified atrial fibrillation: Secondary | ICD-10-CM

## 2014-05-22 LAB — POCT INR: INR: 1.2

## 2014-05-25 ENCOUNTER — Encounter (HOSPITAL_COMMUNITY): Payer: Self-pay

## 2014-05-27 ENCOUNTER — Encounter (HOSPITAL_COMMUNITY): Payer: Self-pay

## 2014-05-29 ENCOUNTER — Encounter (HOSPITAL_COMMUNITY): Payer: Self-pay

## 2014-06-01 ENCOUNTER — Encounter (HOSPITAL_COMMUNITY): Payer: Self-pay

## 2014-06-03 ENCOUNTER — Encounter (HOSPITAL_COMMUNITY): Payer: Self-pay

## 2014-06-05 ENCOUNTER — Other Ambulatory Visit: Payer: Self-pay | Admitting: Pharmacist Clinician (PhC)/ Clinical Pharmacy Specialist

## 2014-06-05 ENCOUNTER — Ambulatory Visit (INDEPENDENT_AMBULATORY_CARE_PROVIDER_SITE_OTHER): Payer: Medicare HMO | Admitting: Pharmacist Clinician (PhC)/ Clinical Pharmacy Specialist

## 2014-06-05 ENCOUNTER — Encounter (HOSPITAL_COMMUNITY): Payer: Self-pay

## 2014-06-05 DIAGNOSIS — I480 Paroxysmal atrial fibrillation: Secondary | ICD-10-CM

## 2014-06-05 DIAGNOSIS — I4891 Unspecified atrial fibrillation: Secondary | ICD-10-CM

## 2014-06-05 DIAGNOSIS — Z7901 Long term (current) use of anticoagulants: Secondary | ICD-10-CM

## 2014-06-05 LAB — POCT INR: INR: 1.7

## 2014-06-05 MED ORDER — WARFARIN SODIUM 2.5 MG PO TABS: ORAL_TABLET | ORAL | Status: DC

## 2014-06-08 ENCOUNTER — Encounter (HOSPITAL_COMMUNITY): Payer: Self-pay

## 2014-06-10 ENCOUNTER — Encounter (HOSPITAL_COMMUNITY): Payer: Self-pay

## 2014-06-12 ENCOUNTER — Encounter (HOSPITAL_COMMUNITY): Payer: Self-pay

## 2014-06-15 ENCOUNTER — Encounter (HOSPITAL_COMMUNITY): Payer: Self-pay

## 2014-06-17 ENCOUNTER — Encounter (HOSPITAL_COMMUNITY): Payer: Self-pay

## 2014-06-17 ENCOUNTER — Ambulatory Visit (HOSPITAL_COMMUNITY)
Admission: RE | Admit: 2014-06-17 | Discharge: 2014-06-17 | Disposition: A | Discharge status: Home / Self Care | Payer: Medicare HMO | Source: Ambulatory Visit | Attending: Internal Medicine | Admitting: Internal Medicine

## 2014-06-17 DIAGNOSIS — R921 Mammographic calcification found on diagnostic imaging of breast: Secondary | ICD-10-CM | POA: Insufficient documentation

## 2014-06-17 DIAGNOSIS — Z1231 Encounter for screening mammogram for malignant neoplasm of breast: Secondary | ICD-10-CM

## 2014-06-19 ENCOUNTER — Encounter (HOSPITAL_COMMUNITY): Payer: Self-pay

## 2014-06-19 ENCOUNTER — Ambulatory Visit (INDEPENDENT_AMBULATORY_CARE_PROVIDER_SITE_OTHER): Payer: Medicare HMO | Admitting: Pharmacist Clinician (PhC)/ Clinical Pharmacy Specialist

## 2014-06-19 DIAGNOSIS — I4891 Unspecified atrial fibrillation: Secondary | ICD-10-CM

## 2014-06-19 DIAGNOSIS — Z7901 Long term (current) use of anticoagulants: Secondary | ICD-10-CM

## 2014-06-19 DIAGNOSIS — I480 Paroxysmal atrial fibrillation: Secondary | ICD-10-CM

## 2014-06-19 LAB — POCT INR: INR: 2.2

## 2014-06-22 ENCOUNTER — Encounter (HOSPITAL_COMMUNITY): Payer: Self-pay

## 2014-06-22 ENCOUNTER — Other Ambulatory Visit: Payer: Self-pay | Admitting: Internal Medicine

## 2014-06-22 DIAGNOSIS — R928 Other abnormal and inconclusive findings on diagnostic imaging of breast: Secondary | ICD-10-CM

## 2014-06-24 ENCOUNTER — Encounter (HOSPITAL_COMMUNITY): Payer: Self-pay

## 2014-06-26 ENCOUNTER — Encounter (HOSPITAL_COMMUNITY): Payer: Self-pay

## 2014-06-29 ENCOUNTER — Encounter (HOSPITAL_COMMUNITY): Payer: Self-pay

## 2014-07-01 ENCOUNTER — Encounter (HOSPITAL_COMMUNITY): Payer: Self-pay

## 2014-07-01 ENCOUNTER — Ambulatory Visit (INDEPENDENT_AMBULATORY_CARE_PROVIDER_SITE_OTHER): Payer: Medicare Other | Admitting: Ophthalmology

## 2014-07-03 ENCOUNTER — Encounter (HOSPITAL_COMMUNITY): Payer: Self-pay

## 2014-07-06 ENCOUNTER — Encounter (HOSPITAL_COMMUNITY): Payer: Self-pay

## 2014-07-07 ENCOUNTER — Ambulatory Visit
Admission: RE | Admit: 2014-07-07 | Discharge: 2014-07-07 | Disposition: A | Discharge status: Home / Self Care | Payer: Medicare HMO | Source: Ambulatory Visit | Attending: Internal Medicine | Admitting: Internal Medicine

## 2014-07-07 ENCOUNTER — Other Ambulatory Visit: Payer: Self-pay | Admitting: Internal Medicine

## 2014-07-07 DIAGNOSIS — R921 Mammographic calcification found on diagnostic imaging of breast: Secondary | ICD-10-CM

## 2014-07-07 DIAGNOSIS — R928 Other abnormal and inconclusive findings on diagnostic imaging of breast: Secondary | ICD-10-CM

## 2014-07-08 ENCOUNTER — Ambulatory Visit (INDEPENDENT_AMBULATORY_CARE_PROVIDER_SITE_OTHER): Payer: Medicare Other | Admitting: Ophthalmology

## 2014-07-08 ENCOUNTER — Encounter (HOSPITAL_COMMUNITY): Payer: Self-pay

## 2014-07-10 ENCOUNTER — Encounter (HOSPITAL_COMMUNITY): Payer: Self-pay

## 2014-07-10 ENCOUNTER — Ambulatory Visit (INDEPENDENT_AMBULATORY_CARE_PROVIDER_SITE_OTHER): Payer: Medicare HMO | Admitting: Pharmacist Clinician (PhC)/ Clinical Pharmacy Specialist

## 2014-07-10 DIAGNOSIS — I480 Paroxysmal atrial fibrillation: Secondary | ICD-10-CM

## 2014-07-10 DIAGNOSIS — I4891 Unspecified atrial fibrillation: Secondary | ICD-10-CM

## 2014-07-10 DIAGNOSIS — Z7901 Long term (current) use of anticoagulants: Secondary | ICD-10-CM

## 2014-07-10 LAB — POCT INR: INR: 2.9

## 2014-07-13 ENCOUNTER — Encounter (HOSPITAL_COMMUNITY): Payer: Self-pay

## 2014-07-14 ENCOUNTER — Ambulatory Visit (INDEPENDENT_AMBULATORY_CARE_PROVIDER_SITE_OTHER): Payer: Medicare HMO | Admitting: Ophthalmology

## 2014-07-14 DIAGNOSIS — E11329 Type 2 diabetes mellitus with mild nonproliferative diabetic retinopathy without macular edema: Secondary | ICD-10-CM

## 2014-07-14 DIAGNOSIS — H35033 Hypertensive retinopathy, bilateral: Secondary | ICD-10-CM

## 2014-07-14 DIAGNOSIS — H43813 Vitreous degeneration, bilateral: Secondary | ICD-10-CM

## 2014-07-14 DIAGNOSIS — I10 Essential (primary) hypertension: Secondary | ICD-10-CM

## 2014-07-14 DIAGNOSIS — H3531 Nonexudative age-related macular degeneration: Secondary | ICD-10-CM

## 2014-07-14 DIAGNOSIS — E11319 Type 2 diabetes mellitus with unspecified diabetic retinopathy without macular edema: Secondary | ICD-10-CM

## 2014-07-15 ENCOUNTER — Encounter (HOSPITAL_COMMUNITY): Payer: Self-pay

## 2014-07-16 ENCOUNTER — Ambulatory Visit
Admission: RE | Admit: 2014-07-16 | Discharge: 2014-07-16 | Disposition: A | Discharge status: Home / Self Care | Payer: Medicare HMO | Source: Ambulatory Visit | Attending: Internal Medicine | Admitting: Internal Medicine

## 2014-07-16 ENCOUNTER — Encounter (INDEPENDENT_AMBULATORY_CARE_PROVIDER_SITE_OTHER): Payer: Self-pay

## 2014-07-16 DIAGNOSIS — R921 Mammographic calcification found on diagnostic imaging of breast: Secondary | ICD-10-CM

## 2014-07-16 HISTORY — PX: BREAST BIOPSY: SHX20

## 2014-07-17 ENCOUNTER — Encounter (HOSPITAL_COMMUNITY): Payer: Self-pay

## 2014-07-20 ENCOUNTER — Encounter (HOSPITAL_COMMUNITY): Payer: Self-pay

## 2014-07-21 ENCOUNTER — Telehealth: Payer: Self-pay | Admitting: Cardiology

## 2014-07-21 ENCOUNTER — Other Ambulatory Visit: Payer: Self-pay | Admitting: *Deleted

## 2014-07-21 DIAGNOSIS — Z01812 Encounter for preprocedural laboratory examination: Secondary | ICD-10-CM

## 2014-07-21 NOTE — Telephone Encounter (Signed)
Pt called in stating that while having a mammogram last week, the imaging center found 2 mass in her right breast and she is to report to Dr. Marcell Anger sure if that is the right name) on 11/30 to go over the surgical approach they are wanting to take with her. She says that they are wanting to remove the mass as soon as possible. Dr. Evelena Peat would like to know if she is medically able to go through with this surgery. Please call  Thanks

## 2014-07-21 NOTE — Telephone Encounter (Signed)
She probably will be fine - but has been having some CP Sx after CABG & May be better off having a Myoview ST to confirm no significant ischemia.   Otherwise, that is a Low Risk Surgery & she should be fine.  Lets order a Myoview & follow-up Appt for Pre-op eval with me or APP.  HARDING,DAVID W

## 2014-07-21 NOTE — Telephone Encounter (Signed)
Pt. Called stated that two masses where found in her breast, and Dr. Evelena Peat wants to know if she is going to be able to handle the surgery

## 2014-07-22 ENCOUNTER — Encounter (HOSPITAL_COMMUNITY): Payer: Self-pay

## 2014-07-27 ENCOUNTER — Encounter (INDEPENDENT_AMBULATORY_CARE_PROVIDER_SITE_OTHER): Payer: Self-pay

## 2014-07-27 ENCOUNTER — Ambulatory Visit (INDEPENDENT_AMBULATORY_CARE_PROVIDER_SITE_OTHER): Payer: Self-pay | Admitting: Surgery

## 2014-07-27 ENCOUNTER — Encounter (HOSPITAL_COMMUNITY): Payer: Self-pay

## 2014-07-27 DIAGNOSIS — N6091 Unspecified benign mammary dysplasia of right breast: Secondary | ICD-10-CM

## 2014-07-28 ENCOUNTER — Other Ambulatory Visit (HOSPITAL_COMMUNITY): Payer: Self-pay | Admitting: Cardiology

## 2014-07-28 DIAGNOSIS — Z01810 Encounter for preprocedural cardiovascular examination: Secondary | ICD-10-CM

## 2014-07-31 ENCOUNTER — Telehealth: Payer: Self-pay | Admitting: *Deleted

## 2014-07-31 ENCOUNTER — Encounter (HOSPITAL_COMMUNITY): Payer: Medicare HMO

## 2014-07-31 NOTE — Telephone Encounter (Signed)
RN informed patient need lexiscan prior to cardiac clearance  test and appointment are schedule. RN notified  [place] earlier today spoke to a Sylvia

## 2014-08-06 ENCOUNTER — Encounter (HOSPITAL_COMMUNITY): Payer: Self-pay | Admitting: Cardiology

## 2014-08-07 ENCOUNTER — Ambulatory Visit (INDEPENDENT_AMBULATORY_CARE_PROVIDER_SITE_OTHER): Payer: Medicare HMO | Admitting: Pharmacist Clinician (PhC)/ Clinical Pharmacy Specialist

## 2014-08-07 DIAGNOSIS — I4891 Unspecified atrial fibrillation: Secondary | ICD-10-CM

## 2014-08-07 DIAGNOSIS — Z7901 Long term (current) use of anticoagulants: Secondary | ICD-10-CM

## 2014-08-07 DIAGNOSIS — I480 Paroxysmal atrial fibrillation: Secondary | ICD-10-CM

## 2014-08-07 LAB — POCT INR: INR: 1.9

## 2014-08-11 ENCOUNTER — Telehealth (HOSPITAL_COMMUNITY): Payer: Self-pay

## 2014-08-11 NOTE — Telephone Encounter (Signed)
Encounter complete. 

## 2014-08-13 ENCOUNTER — Ambulatory Visit (HOSPITAL_COMMUNITY)
Admission: RE | Admit: 2014-08-13 | Discharge: 2014-08-13 | Disposition: A | Discharge status: Home / Self Care | Payer: Medicare HMO | Source: Ambulatory Visit | Attending: Cardiovascular Disease | Admitting: Cardiovascular Disease

## 2014-08-13 DIAGNOSIS — R9431 Abnormal electrocardiogram [ECG] [EKG]: Secondary | ICD-10-CM | POA: Insufficient documentation

## 2014-08-13 DIAGNOSIS — I959 Hypotension, unspecified: Secondary | ICD-10-CM | POA: Diagnosis not present

## 2014-08-13 DIAGNOSIS — I2510 Atherosclerotic heart disease of native coronary artery without angina pectoris: Secondary | ICD-10-CM | POA: Insufficient documentation

## 2014-08-13 DIAGNOSIS — I252 Old myocardial infarction: Secondary | ICD-10-CM | POA: Insufficient documentation

## 2014-08-13 DIAGNOSIS — N189 Chronic kidney disease, unspecified: Secondary | ICD-10-CM | POA: Insufficient documentation

## 2014-08-13 DIAGNOSIS — I447 Left bundle-branch block, unspecified: Secondary | ICD-10-CM | POA: Diagnosis not present

## 2014-08-13 DIAGNOSIS — E669 Obesity, unspecified: Secondary | ICD-10-CM | POA: Insufficient documentation

## 2014-08-13 DIAGNOSIS — R0609 Other forms of dyspnea: Secondary | ICD-10-CM

## 2014-08-13 DIAGNOSIS — Z87891 Personal history of nicotine dependence: Secondary | ICD-10-CM | POA: Insufficient documentation

## 2014-08-13 DIAGNOSIS — J449 Chronic obstructive pulmonary disease, unspecified: Secondary | ICD-10-CM | POA: Diagnosis not present

## 2014-08-13 DIAGNOSIS — I25810 Atherosclerosis of coronary artery bypass graft(s) without angina pectoris: Secondary | ICD-10-CM

## 2014-08-13 DIAGNOSIS — Z951 Presence of aortocoronary bypass graft: Secondary | ICD-10-CM | POA: Diagnosis not present

## 2014-08-13 DIAGNOSIS — Z01812 Encounter for preprocedural laboratory examination: Secondary | ICD-10-CM

## 2014-08-13 DIAGNOSIS — Z6841 Body Mass Index (BMI) 40.0 and over, adult: Secondary | ICD-10-CM | POA: Insufficient documentation

## 2014-08-13 DIAGNOSIS — Z01810 Encounter for preprocedural cardiovascular examination: Secondary | ICD-10-CM | POA: Diagnosis not present

## 2014-08-13 DIAGNOSIS — I129 Hypertensive chronic kidney disease with stage 1 through stage 4 chronic kidney disease, or unspecified chronic kidney disease: Secondary | ICD-10-CM | POA: Diagnosis not present

## 2014-08-13 MED ORDER — REGADENOSON 0.4 MG/5ML IV SOLN
0.4000 mg | Freq: Once | INTRAVENOUS | Status: AC
  Administered 2014-08-13: 0.4 mg via INTRAVENOUS

## 2014-08-13 MED ORDER — TECHNETIUM TC 99M SESTAMIBI GENERIC - CARDIOLITE
30.0000 | Freq: Once | INTRAVENOUS | Status: AC | PRN
  Administered 2014-08-13: 30 via INTRAVENOUS

## 2014-08-13 MED ORDER — AMINOPHYLLINE 25 MG/ML IV SOLN
150.0000 mg | Freq: Once | INTRAVENOUS | Status: AC
  Administered 2014-08-13: 150 mg via INTRAVENOUS

## 2014-08-13 NOTE — Telephone Encounter (Signed)
Encounter complete. 

## 2014-08-13 NOTE — Procedures (Addendum)
[facility] [address] [place] [address] [id number]  Cardiology Nuclear Med Study  Elaine [name] is a 73 y.o. female     MRN : [id number]     DOB: 07-Jan-1941  Procedure Date: 08/13/2014  Nuclear Med Background Indication for Stress Test:  Surgical Clearance, Graft Patency and Abnormal EKG History:  COPD and CAD;MI-10/23/2011;CABG X3-10/24/2011;CKD;No prior NUC MPI for comparison;ECHO in 12/2013-EF=40-45%;MV regurg Cardiac Risk Factors: History of Smoking, Hypertension, LBBB, Lipids, NIDDM and Obesity  Symptoms:  Chest Pain, DOE and Fatigue   Nuclear Pre-Procedure Caffeine/Decaff Intake:  7:00pm NPO After: 5:00am   IV Site: R Forearm  IV 0.9% NS with Angio Cath:  22g  Chest Size (in):  n/a IV Started by: Rolene Course, RN  Height: 5\' 2"  (1.575 m)  Cup Size: DD  BMI:  Body mass index is 46.26 kg/(m^2). Weight:  253 lb (114.76 kg)   Tech Comments:  n/a    Nuclear Med Study 1 or 2 day study: 2 day  Stress Test Type:  [place] Provider:  Glenetta Hew, MD   Resting Radionuclide: Technetium 49m Sestamibi  Resting Radionuclide Dose: 29.0 mCi   Stress Radionuclide:  Technetium 46m Sestamibi  Stress Radionuclide Dose: 32.3 mCi           Stress Protocol Rest HR: 93 Stress HR: 97  Rest BP: 113/75 Stress BP: 100/53  Exercise Time (min): n/a METS: n/a   Predicted Max HR: 147 bpm % Max HR: 65.99 bpm Rate Pressure Product: 11446  Dose of Adenosine (mg):  n/a Dose of Lexiscan: 0.4 mg  Dose of Atropine (mg): n/a Dose of Dobutamine: n/a mcg/kg/min (at max HR)  Stress Test Technologist: Leane Para, CCT Nuclear Technologist: Imagene Riches, CNMT   Rest Procedure:  Myocardial perfusion imaging was performed at rest 45 minutes following the intravenous administration of Technetium 77m Sestamibi. Stress Procedure:  The patient received IV Lexiscan 0.4 mg over 15-seconds.  Technetium 106m Sestamibi  injected at 30-seconds.  Patient experienced Hypotension and 150 mg Aminophylline IV was administered.  There were no significant changes with Lexiscan.  Quantitative spect images were obtained after a 45 minute delay.  Transient Ischemic Dilatation (Normal <1.22):  0.90  QGS EDV:  n/a ml QGS ESV:  n/a ml LV Ejection Fraction: Study not gated  Rest ECG: NSR-LBBB  Stress ECG: No significant change from baseline ECG  QPS Raw Data Images:  Normal; no motion artifact; normal heart/lung ratio. Stress Images:  Anterior, apical and inferoapical perfusion defect Rest Images:  distal anterior and apical perfusion defect Subtraction (SDS):  8  Impression Exercise Capacity:  Lexiscan with no exercise. BP Response:  Hypotensive blood pressure response. Clinical Symptoms:  No symptoms. ECG Impression:  No significant ECG changes with Lexiscan. Comparison with Prior Nuclear Study: No images to compare  Overall Impression:  Intermediate risk stress nuclear study with large, severe intensity perfusion defect of the anterior wall, apex and inferior apex, which is partially reversible (SDS 8) suggesting scar or possibly hibernating myocardium which is ischemic.  LV Wall Motion:  Non-gated due to ectopy  Pixie Casino, MD, [facility] Board Certified in Nuclear Cardiology Attending Cardiologist [place], MD  08/14/2014 12:23 PM

## 2014-08-14 ENCOUNTER — Ambulatory Visit (HOSPITAL_COMMUNITY)
Admission: RE | Admit: 2014-08-14 | Discharge: 2014-08-14 | Disposition: A | Discharge status: Home / Self Care | Payer: Medicare HMO | Source: Ambulatory Visit | Attending: Cardiology | Admitting: Cardiology

## 2014-08-14 DIAGNOSIS — Z01810 Encounter for preprocedural cardiovascular examination: Secondary | ICD-10-CM | POA: Insufficient documentation

## 2014-08-14 DIAGNOSIS — I25810 Atherosclerosis of coronary artery bypass graft(s) without angina pectoris: Secondary | ICD-10-CM

## 2014-08-14 DIAGNOSIS — R0609 Other forms of dyspnea: Secondary | ICD-10-CM

## 2014-08-14 DIAGNOSIS — R9431 Abnormal electrocardiogram [ECG] [EKG]: Secondary | ICD-10-CM | POA: Diagnosis not present

## 2014-08-14 DIAGNOSIS — Z951 Presence of aortocoronary bypass graft: Secondary | ICD-10-CM | POA: Diagnosis not present

## 2014-08-14 DIAGNOSIS — I2510 Atherosclerotic heart disease of native coronary artery without angina pectoris: Secondary | ICD-10-CM

## 2014-08-14 HISTORY — PX: NM MYOVIEW LTD: HXRAD82

## 2014-08-14 MED ORDER — TECHNETIUM TC 99M SESTAMIBI GENERIC - CARDIOLITE
29.0000 | Freq: Once | INTRAVENOUS | Status: AC | PRN
Start: 2014-08-14 — End: 2014-08-14
  Administered 2014-08-14: 29 via INTRAVENOUS

## 2014-08-18 ENCOUNTER — Ambulatory Visit (INDEPENDENT_AMBULATORY_CARE_PROVIDER_SITE_OTHER): Payer: Medicare HMO | Admitting: Cardiology

## 2014-08-18 ENCOUNTER — Encounter: Payer: Self-pay | Admitting: Cardiology

## 2014-08-18 VITALS — BP 119/74 | HR 126 | Ht 62.0 in | Wt 257.7 lb

## 2014-08-18 DIAGNOSIS — I447 Left bundle-branch block, unspecified: Secondary | ICD-10-CM

## 2014-08-18 DIAGNOSIS — R0609 Other forms of dyspnea: Secondary | ICD-10-CM

## 2014-08-18 DIAGNOSIS — Z01818 Encounter for other preprocedural examination: Secondary | ICD-10-CM

## 2014-08-18 DIAGNOSIS — D689 Coagulation defect, unspecified: Secondary | ICD-10-CM

## 2014-08-18 DIAGNOSIS — R0600 Dyspnea, unspecified: Secondary | ICD-10-CM

## 2014-08-18 DIAGNOSIS — I255 Ischemic cardiomyopathy: Secondary | ICD-10-CM

## 2014-08-18 DIAGNOSIS — R931 Abnormal findings on diagnostic imaging of heart and coronary circulation: Secondary | ICD-10-CM

## 2014-08-18 DIAGNOSIS — I10 Essential (primary) hypertension: Secondary | ICD-10-CM

## 2014-08-18 DIAGNOSIS — E785 Hyperlipidemia, unspecified: Secondary | ICD-10-CM

## 2014-08-18 DIAGNOSIS — I2510 Atherosclerotic heart disease of native coronary artery without angina pectoris: Secondary | ICD-10-CM

## 2014-08-18 DIAGNOSIS — R9439 Abnormal result of other cardiovascular function study: Secondary | ICD-10-CM

## 2014-08-18 DIAGNOSIS — I480 Paroxysmal atrial fibrillation: Secondary | ICD-10-CM

## 2014-08-18 NOTE — Progress Notes (Signed)
PATIENT: Elaine [name] MRN: [id number]  DOB: 04-17-1941   DOV:08/19/2014 PCP: Marton Redwood, MD  Clinic Note: Chief Complaint  Patient presents with  . Follow-up    4-5 month follow up, Pt. experiencing SOB ans leg swelling, denies chest pain and pressure  . Coronary Artery Disease    Recent nuclear stress test - abnormal   . Pre-op Exam    HPI: Elaine [name] is a 73 y.o.  female with a PMH below who presents today for followup after recent nuclear stress test ordered for preoperative evaluation for possible breast lump surgery. First diagnosed with MI and February 2015 while in A. Fib with RVR and left bundle branch block.she complained of extreme chest heaviness and pressure concerning for possible stenting. She is not a multivessel CAD with high-grade proximal LAD and moderate left main disease. She was referred for urgent CABGx3 with Left Atrial Appendage clipping. She initially was found to have an ischemic cardiomyopathy with EF of 20-25% with now improvement up to 40 - 45%.she has chronic COPD with exertional dyspnea making it very difficult to ascertain the true nature of her symptoms.  She has chronic two-pillow orthopnea with mild intermittent edema but denies PND. She has not noted any recurrent symptoms to suggest recurrence of A. Fib, despite being off of amiodarone. No bleeding concerns on warfarin.  I was contacted to consider preoperative risk evaluation on her for possible breast surgery. Based on her very difficult to assess symptoms and poor baseline functional status, I felt it best to order a nuclear stress test which has now been performed and shows a large mostly fixed anterior and apical defects with partial reversibility. She really denies any chest pressure or discomfort that was similar to when she was in the A. Fib with RVR / MI, but does occasionally note some heaviness in her chest when she gets really dyspneic appeared she also notes fatigue and that she really  doesn't do much activity per se. She is limited by her knee discomfort as well.she still occasionally gets discomfort in her chest when she lies down flat, and still uses 2 pillows for orthopnea.she denies any PND symptoms. She has not had significant edema since her diuretic dose was increased.  He was again evaluated by her primary doctor/pulmonologist for some dark sputum in the morning. Now but will usually occurs in the mornings and not during the rest of the day. This was thought to be simply related to her COPD.  No rapid or irregular heartbeats that she can tell.  She denies anyTIA/amaurosis fugax or syncope/near syncope symptoms. She seems to be stable warfarin, and denies any melena, hematochezia, hematuria or epistaxis. She denies any claudication type symptoms.  Past Medical History  Diagnosis Date  . Arthritis   . Mild mitral regurgitation by prior echocardiogram     Mild-Mod MR on Echo  . Hyperlipidemia   . Hypertension   . Hypothyroidism   . Ischemic cardiomyopathy - Notable Improvement in EF post CABG 10/20/2013    Echo 2/23: EF 20-25%; mild LVH. anteroseptal Akinesis; mid-apical anterior, inferior & inferoseptal + apical-lateral akinesis; G 1 DDysfxn.; Mild-Mod LA dil; mild MR; Mod PHTN;; F/u Echo 12/2013: EF 40-45%, septal & apical HK, mild LVH; Mod LA dilation  . Acute pulmonary edema with congestive heart failure 10/20/2013    Resolved  . Left bundle branch block (LBBB) on electrocardiogram   . Atrial fibrillation 10/20/2013    On Warfarin  . OSTEOARTHRITIS 08/06/2006  .  Morbid obesity   . Diabetic neuropathy   . Type II diabetes mellitus with complication [id number]  . Chronic kidney disease   . S/P CABG x 3 10/23/2013    LIMA to LAD, SVG to OM2, SVG to RPLB, EVH via right thigh  . Acute myocardial infarction of anterior wall 10/20/2013    Afib, LBBB  . CAD, multiple vessel 10/20/2013    LM, LAD & RCA --> CABG; MYOVIEW 12/'15: Intermediate Risk would large anterior,  anteroapical segment the apex possible infarct and peri-infarct ischemia    Allergies  Allergen Reactions  . Allopurinol Hives  . Tape Rash    Uncoded Allergy. Allergen: Tape Adhesive tape    Current Outpatient Prescriptions  Medication Sig Dispense Refill  . aspirin EC 81 MG tablet Take 1 tablet (81 mg total) by mouth daily.    . carvedilol (COREG) 6.25 MG tablet Take 1 tablet (6.25 mg total) by mouth 2 (two) times daily with a meal. 60 tablet 11  . febuxostat (ULORIC) 40 MG tablet Take 40 mg by mouth daily.    . ferrous sulfate 325 (65 FE) MG tablet Take 1 tablet (325 mg total) by mouth daily with breakfast. 30 tablet 10  . furosemide (LASIX) 80 MG tablet Take 80 mg by mouth daily.    [name] ipratropium-albuterol (DUONEB) 0.5-2.5 (3) MG/3ML SOLN     . levothyroxine (SYNTHROID) 112 MCG tablet Take 1 tablet (112 mcg total) by mouth daily before breakfast. 30 tablet 12  . lisinopril (PRINIVIL,ZESTRIL) 2.5 MG tablet Take 2.5 mg by mouth daily.    [name] oxycodone (OXY-IR) 5 MG capsule TAKE 1-2 TABLETS (5-10 MG TOTAL) BY MOUTH EVERY 3 (THREE) HOURS AS NEEDED FOR MODERATE PAIN    . prednisoLONE acetate (PRED FORTE) 1 % ophthalmic suspension Place 1 drop into the right eye daily.    . rosuvastatin (CRESTOR) 20 MG tablet Take 10 mg by mouth daily.    . SYMBICORT 160-4.5 MCG/ACT inhaler Inhale 1 puff into the lungs 2 (two) times daily.    [name] warfarin (COUMADIN) 2.5 MG tablet Take 1.5 to 2 tablets by mouth daily as directed by coumadin clinic 50 tablet 3   No current facility-administered medications for this visit.    History   Social History Narrative   Lives with husband and son in [place].  She is currently retired.   Former smoker quit in 1995.   ROS: A comprehensive Review of Systems -was performed Review of Systems  Constitutional: Positive for malaise/fatigue. Negative for fever and chills.       Overall not very active.  HENT: Positive for congestion.   Eyes: Negative.     Respiratory: Positive for cough, sputum production (In the mornings:Thick yellowish sputum), shortness of breath (at baseline) and wheezing. Negative for hemoptysis.        Thick yellowish sputum  Cardiovascular: Positive for chest pain (as per HPI), orthopnea (Stable two-pillow) and leg swelling (Notably improved). Negative for palpitations, claudication and PND.  Gastrointestinal: Negative.  Negative for blood in stool and melena (not truly melena, but is on iron supplement that makes her stool dark).  Genitourinary: Negative.  Negative for dysuria and hematuria.  Musculoskeletal: Positive for joint pain.  Neurological: Negative.  Negative for dizziness, tingling, tremors, sensory change, speech change, focal weakness, seizures and loss of consciousness.  Psychiatric/Behavioral: Positive for memory loss (Admits to not remembering things). Negative for depression. The patient is not nervous/anxious.   All other systems reviewed and are negative.  PHYSICAL  EXAM BP 119/74 mmHg  Pulse 126  Ht 5\' 2"  (1.575 m)  Wt 257 lb 11.2 oz (116.892 kg)  BMI 47.12 kg/m2 General appearance: morbidly obese, pleasant woman. A&Ox3 Answers questions appropriately HEENT: [place]/AT, MMM, anicteric sclera; R eye s/p corneal transplant with irregular pupil and has mild lateral drift with reduced abduction Neck: no adenopathy, no carotid bruit, no JVD and supple, symmetrical, trachea midline Lungs: Mild interstitial sounds bilaterally with faint expiratory wheeze, normal percussion bilaterally and nonlabored, good air movement Heart: regular rate and rhythm, S1, S2 normal, no murmur, click, rub or gallop and normal apical impulse Abdomen: soft, non-tender; bowel sounds normal; no masses,  no organomegaly Extremities: no ulcers, gangrene or trophic changes and ~ 1-2+ bilateral lower extremity edema. Pulses: 2+ and symmetric Skin: Skin color, texture, turgor normal. No rashes or lesions Neurologic: Grossly normal    Adult ECG Report - Atrial fibrillation versus flutter with variable block. Nonspecific IVCD/LBBB with Left Axis Deviation; cannot exclude lateral infarct, age undetermined.  Stable EKG  Recent Labs: none since discharge  ASSESSMENT / PLAN: Abnormal nuclear stress test Very disconcerting findings on Myoview. Her EF was not evaluated by the nuclear stress test as a result of atrial fibrillation. She could potentially have been artifactual defect relating to her conduction delay, however the distribution is relatively large. Unfortunately, the followup echo did not describe any potential wall motion abnormality. With a intermediate risk stress test and a large defect that could potentially lead to her low EF, but could also potentially indicate significant ischemia the patient with exertional dyspnea, I think it reasonable to evaluate for graft patency.  Plan: Confirmatory evaluation with a cardiac catheterization. She is asked that we do this during my next available Date which would be in January. As she is not having active symptoms this should be acceptable.  Cardiac Catheterization Consent: Left Heart Catheterization with Native Coronary and Graft Angiography +/- PCI The procedure with Risks/Benefits/Alternatives and Indications was reviewed with the patient.  All questions were answered.    Risks / Complications include, but not limited to: Death, MI, CVA/TIA, VF/VT (with defibrillation), Bradycardia (need for temporary pacer placement), contrast induced nephropathy , bleeding / bruising / hematoma / pseudoaneurysm, vascular or coronary injury (with possible emergent CT or Vascular Surgery), adverse medication reactions, infection.  Additional risks involving the use of radiation with the possibility of radiation burns and cancer were explained in detail.  The patient voices understanding and agrees to proceed.     Pre-operative clearance Unfortunately with her history, she does have  relatively significant lung disease, and now has an abnormal stress test. This should need to be evaluated prior to Foley judging her perioperative risk. If there is a significant lesion, and PCI's recommended that I would consider bare-metal stent. The desire for breast surgery would be to perform it relatively soon to avoid potential metastatic spread.   CAD status post CABG She is on aspirin along with beta blocker or, ACE inhibitor and statin. Grossly abnormal stress test despite no true anginal symptoms. She doesn't exertional dyspnea which is chronic and cannot be discounted. Notably, however she had anginal discomfort at the time of her MI in February of 2015.  We will get a better evaluation of her anatomy by catheterization in January 2016.  ICM EF by Echo 20-25% Feb 2015, recently improved to 40-45% by echo 01/13/14 I plan to recheck an echocardiogram in July. However this did not occur. Pending the results of her catheterization, would consider  rechecking an echocardiogram to get a better assessment of her ejection fraction. Other than exertional dyspnea but no active heart failure symptoms. She is on high-dose diuretic but seems stable from an edema standpoint. No further than the standard 2 pillows orthopnea  Essential hypertension Stable overall also be monitored by a nephrologist. At present she is now on an ACE inhibitor apparently restarted prior to this visit.  Hyperlipidemia with target LDL less than 70 Last lipid evaluation showed well-controlled lipids back in February. Since would have her get preoperative/precath labs checked him before cath in January, we'll go ahead and check a fasting lipid panel at that time along with CBC and chemistries  DOE (dyspnea on exertion) Clearly multifactorial with COPD and cardiomyopathy. We will see how much of a component of ischemia is playing a role. We'll also plan to recheck echocardiogram post cath.  Severe obesity (BMI >= 40)   Once we do an ischemic evaluation with catheterization, we'll need to discuss again the importance of getting back into doing some type of activity -- whether it is water aerobics or water walking to help balance out her obesity, I think that any type of activity is acceptable.  PAF- doing MI and perioperative CABG No further episodes of A. Fib as far as I can tell since her bypass surgery. However based on her at high stroke risk, she continues to be on warfarin. This will be held 5 days precath.    Orders Placed This Encounter  Procedures  . CBC  . Basic metabolic panel  . TSH  . Protime-INR  . APTT  . CBC  . EKG 12-Lead  . LEFT HEART CATHETERIZATION WITH CORONARY/GRAFT ANGIOGRAM   No orders of the defined types were placed in this encounter.    Followup: 3 months  DAVID W. Ellyn Hack, M.D., M.S. Interventional Cardiology [facility]  Cc: Dr. Donnie Mesa

## 2014-08-18 NOTE — Patient Instructions (Addendum)
Please have lab-CBC NEXT WEEK  DO LABS ON Sep 03, 2014 FOR CATH NEXT WEEK.   HOLD WARFARIN (COUMADIN) 5 DAYS PRIOR TO CARDIAC CATH  SCHEDULE THE SECOND WEEK OF JAN 2016- RIGHT GROIN ACCESS--Your physician has requested that you have a cardiac catheterization. Cardiac catheterization is used to diagnose and/or treat various heart conditions. Doctors may recommend this procedure for a number of different reasons. The most common reason is to evaluate chest pain. Chest pain can be a symptom of coronary artery disease (CAD), and cardiac catheterization can show whether plaque is narrowing or blocking your heart's arteries. This procedure is also used to evaluate the valves, as well as measure the blood flow and oxygen levels in different parts of your heart. For further information please visit [email or web address]. Please follow instruction sheet, as given.    Your physician wants you to follow-up in [place]. You will receive a reminder letter in the mail two months in advance. If you don't receive a letter, please call our office to schedule the follow-up appointment.

## 2014-08-19 ENCOUNTER — Encounter: Payer: Self-pay | Admitting: Cardiology

## 2014-08-19 DIAGNOSIS — R0600 Dyspnea, unspecified: Secondary | ICD-10-CM | POA: Insufficient documentation

## 2014-08-19 DIAGNOSIS — R0609 Other forms of dyspnea: Secondary | ICD-10-CM | POA: Insufficient documentation

## 2014-08-19 DIAGNOSIS — R9439 Abnormal result of other cardiovascular function study: Secondary | ICD-10-CM | POA: Insufficient documentation

## 2014-08-19 DIAGNOSIS — Z01818 Encounter for other preprocedural examination: Secondary | ICD-10-CM | POA: Insufficient documentation

## 2014-08-19 NOTE — Assessment & Plan Note (Signed)
Unfortunately with her history, she does have relatively significant lung disease, and now has an abnormal stress test. This should need to be evaluated prior to Foley judging her perioperative risk. If there is a significant lesion, and PCI's recommended that I would consider bare-metal stent. The desire for breast surgery would be to perform it relatively soon to avoid potential metastatic spread.

## 2014-08-19 NOTE — Assessment & Plan Note (Signed)
No further episodes of A. Fib as far as I can tell since her bypass surgery. However based on her at high stroke risk, she continues to be on warfarin. This will be held 5 days precath.

## 2014-08-19 NOTE — Assessment & Plan Note (Signed)
She is on aspirin along with beta blocker or, ACE inhibitor and statin. Grossly abnormal stress test despite no true anginal symptoms. She doesn't exertional dyspnea which is chronic and cannot be discounted. Notably, however she had anginal discomfort at the time of her MI in February of 2015.  We will get a better evaluation of her anatomy by catheterization in January 2016.

## 2014-08-19 NOTE — Assessment & Plan Note (Signed)
Last lipid evaluation showed well-controlled lipids back in February. Since would have her get preoperative/precath labs checked him before cath in January, we'll go ahead and check a fasting lipid panel at that time along with CBC and chemistries

## 2014-08-19 NOTE — Assessment & Plan Note (Signed)
Clearly multifactorial with COPD and cardiomyopathy. We will see how much of a component of ischemia is playing a role. We'll also plan to recheck echocardiogram post cath.

## 2014-08-19 NOTE — Assessment & Plan Note (Addendum)
I plan to recheck an echocardiogram in July. However this did not occur. Pending the results of her catheterization, would consider rechecking an echocardiogram to get a better assessment of her ejection fraction. Other than exertional dyspnea but no active heart failure symptoms. She is on high-dose diuretic but seems stable from an edema standpoint. No further than the standard 2 pillows orthopnea

## 2014-08-19 NOTE — Assessment & Plan Note (Signed)
Stable overall also be monitored by a nephrologist. At present she is now on an ACE inhibitor apparently restarted prior to this visit.

## 2014-08-19 NOTE — Assessment & Plan Note (Signed)
Very disconcerting findings on Myoview. Her EF was not evaluated by the nuclear stress test as a result of atrial fibrillation. She could potentially have been artifactual defect relating to her conduction delay, however the distribution is relatively large. Unfortunately, the followup echo did not describe any potential wall motion abnormality. With a intermediate risk stress test and a large defect that could potentially lead to her low EF, but could also potentially indicate significant ischemia the patient with exertional dyspnea, I think it reasonable to evaluate for graft patency.  Plan: Confirmatory evaluation with a cardiac catheterization. She is asked that we do this during my next available Date which would be in January. As she is not having active symptoms this should be acceptable.  Cardiac Catheterization Consent: Left Heart Catheterization with Native Coronary and Graft Angiography +/- PCI The procedure with Risks/Benefits/Alternatives and Indications was reviewed with the patient.  All questions were answered.    Risks / Complications include, but not limited to: Death, MI, CVA/TIA, VF/VT (with defibrillation), Bradycardia (need for temporary pacer placement), contrast induced nephropathy , bleeding / bruising / hematoma / pseudoaneurysm, vascular or coronary injury (with possible emergent CT or Vascular Surgery), adverse medication reactions, infection.  Additional risks involving the use of radiation with the possibility of radiation burns and cancer were explained in detail.  The patient voices understanding and agrees to proceed.

## 2014-08-19 NOTE — Assessment & Plan Note (Signed)
Once we do an ischemic evaluation with catheterization, we'll need to discuss again the importance of getting back into doing some type of activity -- whether it is water aerobics or water walking to help balance out her obesity, I think that any type of activity is acceptable.

## 2014-08-24 ENCOUNTER — Telehealth: Payer: Self-pay | Admitting: Cardiology

## 2014-08-24 DIAGNOSIS — R0600 Dyspnea, unspecified: Secondary | ICD-10-CM

## 2014-08-24 DIAGNOSIS — Z01818 Encounter for other preprocedural examination: Secondary | ICD-10-CM

## 2014-08-24 DIAGNOSIS — D689 Coagulation defect, unspecified: Secondary | ICD-10-CM

## 2014-08-24 DIAGNOSIS — R9439 Abnormal result of other cardiovascular function study: Secondary | ICD-10-CM

## 2014-08-24 DIAGNOSIS — R0609 Other forms of dyspnea: Secondary | ICD-10-CM

## 2014-08-24 NOTE — Telephone Encounter (Signed)
Patient went to [facility] instead of [facility] for labwork recently ordered. Reordered for that location.

## 2014-08-25 LAB — CBC
HCT: 35.6 % — ABNORMAL LOW (ref 36.0–46.0)
Hemoglobin: 11.8 g/dL — ABNORMAL LOW (ref 12.0–15.0)
MCH: 26.3 pg (ref 26.0–34.0)
MCHC: 33.1 g/dL (ref 30.0–36.0)
MCV: 79.5 fL (ref 78.0–100.0)
MPV: 10.1 fL (ref 9.4–12.4)
Platelets: 263 10*3/uL (ref 150–400)
RBC: 4.48 MIL/uL (ref 3.87–5.11)
RDW: 17.8 % — ABNORMAL HIGH (ref 11.5–15.5)
WBC: 7.2 10*3/uL (ref 4.0–10.5)

## 2014-09-02 ENCOUNTER — Telehealth: Payer: Self-pay | Admitting: *Deleted

## 2014-09-02 NOTE — Telephone Encounter (Signed)
-----   Message from Leonie Man, MD sent at 09/01/2014  9:32 PM EST ----- CBC has improved. OK for cath  [facility]

## 2014-09-02 NOTE — Telephone Encounter (Signed)
Spoke to patient. Result given . Verbalized understanding Patient aware to go to lab tomorrow for cath pre- lab. RN will let Elaine [name]- pharm -d know- patient has an appointment on 09/04/2014 that can be cancel. Patient is aware to hold warfarin starting on 09/03/14- (last day) until procedure.

## 2014-09-04 ENCOUNTER — Other Ambulatory Visit: Payer: Self-pay | Admitting: *Deleted

## 2014-09-04 ENCOUNTER — Encounter: Payer: Self-pay | Admitting: Pharmacist Clinician (PhC)/ Clinical Pharmacy Specialist

## 2014-09-04 ENCOUNTER — Ambulatory Visit: Payer: Medicare HMO | Admitting: Pharmacist Clinician (PhC)/ Clinical Pharmacy Specialist

## 2014-09-04 DIAGNOSIS — Z01818 Encounter for other preprocedural examination: Secondary | ICD-10-CM

## 2014-09-04 LAB — BASIC METABOLIC PANEL
BUN: 48 mg/dL — ABNORMAL HIGH (ref 6–23)
CALCIUM: 9.7 mg/dL (ref 8.4–10.5)
CHLORIDE: 99 meq/L (ref 96–112)
CO2: 28 mEq/L (ref 19–32)
Creat: 1.93 mg/dL — ABNORMAL HIGH (ref 0.50–1.10)
GLUCOSE: 215 mg/dL — AB (ref 70–99)
POTASSIUM: 4.4 meq/L (ref 3.5–5.3)
SODIUM: 140 meq/L (ref 135–145)

## 2014-09-04 LAB — CBC
HEMATOCRIT: 37.9 % (ref 36.0–46.0)
HEMOGLOBIN: 11.8 g/dL — AB (ref 12.0–15.0)
MCH: 26.1 pg (ref 26.0–34.0)
MCHC: 31.1 g/dL (ref 30.0–36.0)
MCV: 83.8 fL (ref 78.0–100.0)
MPV: 10 fL (ref 9.4–12.4)
PLATELETS: 257 10*3/uL (ref 150–400)
RBC: 4.52 MIL/uL (ref 3.87–5.11)
RDW: 17.8 % — ABNORMAL HIGH (ref 11.5–15.5)
WBC: 6.1 10*3/uL (ref 4.0–10.5)

## 2014-09-04 LAB — APTT: APTT: 36 s (ref 24–37)

## 2014-09-04 LAB — PROTIME-INR
INR: 2.38 — AB (ref <1.50)
Prothrombin Time: 26 seconds — ABNORMAL HIGH (ref 11.6–15.2)

## 2014-09-04 LAB — TSH: TSH: 3.708 u[IU]/mL (ref 0.350–4.500)

## 2014-09-07 ENCOUNTER — Encounter (HOSPITAL_COMMUNITY): Payer: Self-pay | Admitting: Pharmacy Technician

## 2014-09-07 ENCOUNTER — Ambulatory Visit: Payer: Medicare HMO | Admitting: Thoracic Surgery (Cardiothoracic Vascular Surgery)

## 2014-09-07 ENCOUNTER — Other Ambulatory Visit: Payer: Self-pay | Admitting: Surgery

## 2014-09-07 MED ORDER — SODIUM CHLORIDE 0.9 % IJ SOLN: 3.0000 mL | Freq: Two times a day (BID) | INTRAMUSCULAR | Status: DC

## 2014-09-07 MED ORDER — SODIUM CHLORIDE 0.9 % IV SOLN
INTRAVENOUS | Status: DC
  Administered 2014-09-08: 08:00:00 via INTRAVENOUS

## 2014-09-07 MED ORDER — CEFAZOLIN SODIUM-DEXTROSE 2-3 GM-% IV SOLR: 2.0000 g | INTRAVENOUS | Status: DC

## 2014-09-08 ENCOUNTER — Encounter (HOSPITAL_COMMUNITY)
Admission: RE | Disposition: A | Discharge status: Home / Self Care | Payer: Self-pay | Source: Ambulatory Visit | Attending: Cardiology

## 2014-09-08 ENCOUNTER — Encounter (HOSPITAL_COMMUNITY): Payer: Self-pay | Admitting: Cardiology

## 2014-09-08 ENCOUNTER — Ambulatory Visit (HOSPITAL_COMMUNITY)
Admission: RE | Admit: 2014-09-08 | Discharge: 2014-09-08 | Disposition: A | Discharge status: Home / Self Care | Payer: Medicare HMO | Source: Ambulatory Visit | Attending: Cardiology | Admitting: Cardiology

## 2014-09-08 DIAGNOSIS — I2510 Atherosclerotic heart disease of native coronary artery without angina pectoris: Secondary | ICD-10-CM

## 2014-09-08 DIAGNOSIS — E785 Hyperlipidemia, unspecified: Secondary | ICD-10-CM | POA: Insufficient documentation

## 2014-09-08 DIAGNOSIS — E1169 Type 2 diabetes mellitus with other specified complication: Secondary | ICD-10-CM | POA: Diagnosis present

## 2014-09-08 DIAGNOSIS — I252 Old myocardial infarction: Secondary | ICD-10-CM | POA: Insufficient documentation

## 2014-09-08 DIAGNOSIS — M1990 Unspecified osteoarthritis, unspecified site: Secondary | ICD-10-CM | POA: Diagnosis not present

## 2014-09-08 DIAGNOSIS — Z7982 Long term (current) use of aspirin: Secondary | ICD-10-CM | POA: Insufficient documentation

## 2014-09-08 DIAGNOSIS — R0600 Dyspnea, unspecified: Secondary | ICD-10-CM | POA: Diagnosis present

## 2014-09-08 DIAGNOSIS — N183 Chronic kidney disease, stage 3 (moderate): Secondary | ICD-10-CM

## 2014-09-08 DIAGNOSIS — Z951 Presence of aortocoronary bypass graft: Secondary | ICD-10-CM | POA: Diagnosis not present

## 2014-09-08 DIAGNOSIS — E1140 Type 2 diabetes mellitus with diabetic neuropathy, unspecified: Secondary | ICD-10-CM | POA: Insufficient documentation

## 2014-09-08 DIAGNOSIS — Z7901 Long term (current) use of anticoagulants: Secondary | ICD-10-CM | POA: Diagnosis not present

## 2014-09-08 DIAGNOSIS — E118 Type 2 diabetes mellitus with unspecified complications: Secondary | ICD-10-CM | POA: Diagnosis present

## 2014-09-08 DIAGNOSIS — I501 Left ventricular failure: Secondary | ICD-10-CM | POA: Diagnosis not present

## 2014-09-08 DIAGNOSIS — J449 Chronic obstructive pulmonary disease, unspecified: Secondary | ICD-10-CM | POA: Insufficient documentation

## 2014-09-08 DIAGNOSIS — R0609 Other forms of dyspnea: Secondary | ICD-10-CM | POA: Diagnosis present

## 2014-09-08 DIAGNOSIS — Z79899 Other long term (current) drug therapy: Secondary | ICD-10-CM | POA: Insufficient documentation

## 2014-09-08 DIAGNOSIS — I429 Cardiomyopathy, unspecified: Secondary | ICD-10-CM | POA: Insufficient documentation

## 2014-09-08 DIAGNOSIS — I25810 Atherosclerosis of coronary artery bypass graft(s) without angina pectoris: Secondary | ICD-10-CM | POA: Insufficient documentation

## 2014-09-08 DIAGNOSIS — N189 Chronic kidney disease, unspecified: Secondary | ICD-10-CM | POA: Diagnosis not present

## 2014-09-08 DIAGNOSIS — Z6841 Body Mass Index (BMI) 40.0 and over, adult: Secondary | ICD-10-CM | POA: Diagnosis not present

## 2014-09-08 DIAGNOSIS — E039 Hypothyroidism, unspecified: Secondary | ICD-10-CM | POA: Diagnosis not present

## 2014-09-08 DIAGNOSIS — I447 Left bundle-branch block, unspecified: Secondary | ICD-10-CM | POA: Insufficient documentation

## 2014-09-08 DIAGNOSIS — Z01818 Encounter for other preprocedural examination: Secondary | ICD-10-CM | POA: Insufficient documentation

## 2014-09-08 DIAGNOSIS — I129 Hypertensive chronic kidney disease with stage 1 through stage 4 chronic kidney disease, or unspecified chronic kidney disease: Secondary | ICD-10-CM | POA: Diagnosis not present

## 2014-09-08 DIAGNOSIS — I10 Essential (primary) hypertension: Secondary | ICD-10-CM | POA: Diagnosis present

## 2014-09-08 DIAGNOSIS — I482 Chronic atrial fibrillation: Secondary | ICD-10-CM | POA: Diagnosis not present

## 2014-09-08 DIAGNOSIS — N179 Acute kidney failure, unspecified: Secondary | ICD-10-CM | POA: Diagnosis present

## 2014-09-08 DIAGNOSIS — R9439 Abnormal result of other cardiovascular function study: Secondary | ICD-10-CM | POA: Diagnosis present

## 2014-09-08 HISTORY — PX: LEFT HEART CATHETERIZATION WITH CORONARY/GRAFT ANGIOGRAM: SHX5450

## 2014-09-08 LAB — BASIC METABOLIC PANEL
Anion gap: 4 — ABNORMAL LOW (ref 5–15)
BUN: 42 mg/dL — ABNORMAL HIGH (ref 6–23)
CHLORIDE: 103 meq/L (ref 96–112)
CO2: 26 mmol/L (ref 19–32)
CREATININE: 2.02 mg/dL — AB (ref 0.50–1.10)
Calcium: 8.7 mg/dL (ref 8.4–10.5)
GFR calc Af Amer: 27 mL/min — ABNORMAL LOW (ref >90)
GFR calc non Af Amer: 23 mL/min — ABNORMAL LOW (ref >90)
Glucose, Bld: 147 mg/dL — ABNORMAL HIGH (ref 70–99)
POTASSIUM: 3.9 mmol/L (ref 3.5–5.1)
Sodium: 133 mmol/L — ABNORMAL LOW (ref 135–145)

## 2014-09-08 LAB — PROTIME-INR
INR: 1.42 (ref 0.00–1.49)
Prothrombin Time: 17.5 seconds — ABNORMAL HIGH (ref 11.6–15.2)

## 2014-09-08 LAB — GLUCOSE, CAPILLARY
GLUCOSE-CAPILLARY: 153 mg/dL — AB (ref 70–99)
GLUCOSE-CAPILLARY: 117 mg/dL — AB (ref 70–99)

## 2014-09-08 SURGERY — LEFT HEART CATHETERIZATION WITH CORONARY/GRAFT ANGIOGRAM
Anesthesia: LOCAL

## 2014-09-08 MED ORDER — METOPROLOL TARTRATE 1 MG/ML IV SOLN
INTRAVENOUS | Status: AC
  Filled 2014-09-08: qty 5

## 2014-09-08 MED ORDER — FENTANYL CITRATE 0.05 MG/ML IJ SOLN
INTRAMUSCULAR | Status: AC
  Filled 2014-09-08: qty 2

## 2014-09-08 MED ORDER — MIDAZOLAM HCL 2 MG/2ML IJ SOLN
INTRAMUSCULAR | Status: AC
  Filled 2014-09-08: qty 2

## 2014-09-08 MED ORDER — LIDOCAINE HCL (PF) 1 % IJ SOLN
INTRAMUSCULAR | Status: AC
  Filled 2014-09-08: qty 30

## 2014-09-08 MED ORDER — HEPARIN (PORCINE) IN NACL 2-0.9 UNIT/ML-% IJ SOLN
INTRAMUSCULAR | Status: AC
  Filled 2014-09-08: qty 500

## 2014-09-08 MED ORDER — MORPHINE SULFATE 2 MG/ML IJ SOLN: 2.0000 mg | INTRAMUSCULAR | Status: DC | PRN

## 2014-09-08 MED ORDER — SODIUM CHLORIDE 0.9 % IV SOLN: 1.0000 mL/kg/h | INTRAVENOUS | Status: DC

## 2014-09-08 MED ORDER — ONDANSETRON HCL 4 MG/2ML IJ SOLN: 4.0000 mg | Freq: Four times a day (QID) | INTRAMUSCULAR | Status: DC | PRN

## 2014-09-08 MED ORDER — ACETAMINOPHEN 325 MG PO TABS: 650.0000 mg | ORAL_TABLET | ORAL | Status: DC | PRN

## 2014-09-08 NOTE — Progress Notes (Signed)
Site area: RFA Site Prior to Removal:  Level 0 Pressure Applied For:25 min Manual:   yes Patient Status During Pull:  stable Post Pull Site:  Level 0 Post Pull Instructions Given:  yes Post Pull Pulses Present: palpable Dressing Applied:  clear Bedrest begins @ 1430 Comments:

## 2014-09-08 NOTE — Discharge Instructions (Signed)

## 2014-09-08 NOTE — Interval H&P Note (Signed)
History and Physical Interval Note:  09/08/2014 12:22 PM  Elaine [name]  has presented today for surgery, with the diagnosis of dysmia, abnormal stress test. Principal Problem:   Abnormal nuclear stress test Active Problems:   Left main coronary artery disease   Chronic kidney disease, stage III (moderate)   CAD status post CABG   Hyperlipidemia with target LDL less than 70   Essential hypertension   Severe obesity (BMI >= 40)   DOE (dyspnea on exertion)   Type II diabetes mellitus with complication     The various methods of treatment have been discussed with the patient and family. After consideration of risks, benefits and other options for treatment, the patient has consented to  Procedure(s): LEFT HEART CATHETERIZATION WITH CORONARY/GRAFT ANGIOGRAM (N/A) +/- PCI as a surgical intervention .  The patient's history has been reviewed, patient examined, no change in status, stable for surgery.  I have reviewed the patient's chart and labs.  Questions were answered to the patient's satisfaction.    Cath Lab Visit (complete for each Cath Lab visit)  Clinical Evaluation Leading to the Procedure:   ACS: No.  Non-ACS:    Anginal Classification: CCS II  Anti-ischemic medical therapy: Maximal Therapy (2 or more classes of medications)  Non-Invasive Test Results: Intermediate-risk stress test findings: cardiac mortality 1-3%/year  Prior CABG: Previous CABG  AUC FOR CATH: CAD Assessment (Coronary Angiography With or Without Left Heart Catheterization and/or Left Ventriculography)  Patient Information:    Patients With Known Obstructive CAD (e.g., Prior MI, Prior PCI, Prior CABG, or Obstructive Disease on Invasive Angiography)   Post Revascularization (PCI or CABG)   Intermediate-risk noninvasive findings   Worsening or limiting symptoms  AUC Score:   A (7)   Indication:   54  AUC FOR PCI Ischemic Symptoms? CCS II (Slight limitation of ordinary activity) Anti-ischemic  Medical Therapy? Maximal Medical Therapy (2 or more classes of medications) Non-invasive Test Results? Intermediate-risk stress test findings: cardiac mortality 1-3%/year Prior CABG? Previous CABG   Patient Information:   >=1 SVG stenosis  A (7)  Indication: 53; Score: 7   Patient Information:   All bypass grafts patent, >=1 lesion(s) in native coronaries without bypass grafts  A (7)  Indication: 53; Score: 7   Patient Information:   Native 3V-CAD, failure of multiple grafts, depressed LVEF, patent LIMA graft PCI  U (6)  Indication: 68; Score: 6   Patient Information:   Native 3V-CAD, failure of multiple grafts, depressed LVEF, patent LIMA graft CABG  A (7)  Indication: 68; Score: 7   Patient Information:   Native 3V-CAD, failure of multiple grafts, depressed LVEF, nonfunctional LIMA graft PCI  A (8)  Indication: 69; Score: 8   Patient Information:   Native 3V-CAD, failure of multiple grafts, depressed LVEF, nonfunctional LIMA graft CABG  U (6)  Indication: 69; Score: 6   HARDING, DAVID W

## 2014-09-08 NOTE — H&P (View-Only) (Signed)
PATIENT: Elaine [name] MRN: [id number]  DOB: [birth year]   DOV:08/19/2014 PCP: Marton Redwood, MD  Clinic Note: Chief Complaint  Patient presents with  . Follow-up    4-5 month follow up, Pt. experiencing SOB ans leg swelling, denies chest pain and pressure  . Coronary Artery Disease    Recent nuclear stress test - abnormal   . Pre-op Exam    HPI: ATHZIRI MASTERMAN is a 74 y.o.  female with a PMH below who presents today for followup after recent nuclear stress test ordered for preoperative evaluation for possible breast lump surgery. First diagnosed with MI and February 2015 while in A. Fib with RVR and left bundle branch block.she complained of extreme chest heaviness and pressure concerning for possible stenting. She is not a multivessel CAD with high-grade proximal LAD and moderate left main disease. She was referred for urgent CABGx3 with Left Atrial Appendage clipping. She initially was found to have an ischemic cardiomyopathy with EF of 20-25% with now improvement up to 40 - 45%.she has chronic COPD with exertional dyspnea making it very difficult to ascertain the true nature of her symptoms.  She has chronic two-pillow orthopnea with mild intermittent edema but denies PND. She has not noted any recurrent symptoms to suggest recurrence of A. Fib, despite being off of amiodarone. No bleeding concerns on warfarin.  I was contacted to consider preoperative risk evaluation on her for possible breast surgery. Based on her very difficult to assess symptoms and poor baseline functional status, I felt it best to order a nuclear stress test which has now been performed and shows a large mostly fixed anterior and apical defects with partial reversibility. She really denies any chest pressure or discomfort that was similar to when she was in the A. Fib with RVR / MI, but does occasionally note some heaviness in her chest when she gets really dyspneic appeared she also notes fatigue and that she really  doesn't do much activity per se. She is limited by her knee discomfort as well.she still occasionally gets discomfort in her chest when she lies down flat, and still uses 2 pillows for orthopnea.she denies any PND symptoms. She has not had significant edema since her diuretic dose was increased.  He was again evaluated by her primary doctor/pulmonologist for some dark sputum in the morning. Now but will usually occurs in the mornings and not during the rest of the day. This was thought to be simply related to her COPD.  No rapid or irregular heartbeats that she can tell.  She denies anyTIA/amaurosis fugax or syncope/near syncope symptoms. She seems to be stable warfarin, and denies any melena, hematochezia, hematuria or epistaxis. She denies any claudication type symptoms.  Past Medical History  Diagnosis Date  . Arthritis   . Mild mitral regurgitation by prior echocardiogram     Mild-Mod MR on Echo  . Hyperlipidemia   . Hypertension   . Hypothyroidism   . Ischemic cardiomyopathy - Notable Improvement in EF post CABG 10/20/2013    Echo 2/23: EF 20-25%; mild LVH. anteroseptal Akinesis; mid-apical anterior, inferior & inferoseptal + apical-lateral akinesis; G 1 DDysfxn.; Mild-Mod LA dil; mild MR; Mod PHTN;; F/u Echo 12/2013: EF 40-45%, septal & apical HK, mild LVH; Mod LA dilation  . Acute pulmonary edema with congestive heart failure 10/20/2013    Resolved  . Left bundle branch block (LBBB) on electrocardiogram   . Atrial fibrillation 10/20/2013    On Warfarin  . OSTEOARTHRITIS 08/06/2006  .  Morbid obesity   . Diabetic neuropathy   . Type II diabetes mellitus with complication [id number]  . Chronic kidney disease   . S/P CABG x 3 10/23/2013    LIMA to LAD, SVG to OM2, SVG to RPLB, EVH via right thigh  . Acute myocardial infarction of anterior wall 10/20/2013    Afib, LBBB  . CAD, multiple vessel 10/20/2013    LM, LAD & RCA --> CABG; MYOVIEW 12/'15: Intermediate Risk would large anterior,  anteroapical segment the apex possible infarct and peri-infarct ischemia    Allergies  Allergen Reactions  . Allopurinol Hives  . Tape Rash    Uncoded Allergy. Allergen: Tape Adhesive tape    Current Outpatient Prescriptions  Medication Sig Dispense Refill  . aspirin EC 81 MG tablet Take 1 tablet (81 mg total) by mouth daily.    . carvedilol (COREG) 6.25 MG tablet Take 1 tablet (6.25 mg total) by mouth 2 (two) times daily with a meal. 60 tablet 11  . febuxostat (ULORIC) 40 MG tablet Take 40 mg by mouth daily.    . ferrous sulfate 325 (65 FE) MG tablet Take 1 tablet (325 mg total) by mouth daily with breakfast. 30 tablet 10  . furosemide (LASIX) 80 MG tablet Take 80 mg by mouth daily.    [name] ipratropium-albuterol (DUONEB) 0.5-2.5 (3) MG/3ML SOLN     . levothyroxine (SYNTHROID) 112 MCG tablet Take 1 tablet (112 mcg total) by mouth daily before breakfast. 30 tablet 12  . lisinopril (PRINIVIL,ZESTRIL) 2.5 MG tablet Take 2.5 mg by mouth daily.    [name] oxycodone (OXY-IR) 5 MG capsule TAKE 1-2 TABLETS (5-10 MG TOTAL) BY MOUTH EVERY 3 (THREE) HOURS AS NEEDED FOR MODERATE PAIN    . prednisoLONE acetate (PRED FORTE) 1 % ophthalmic suspension Place 1 drop into the right eye daily.    . rosuvastatin (CRESTOR) 20 MG tablet Take 10 mg by mouth daily.    . SYMBICORT 160-4.5 MCG/ACT inhaler Inhale 1 puff into the lungs 2 (two) times daily.    [name] warfarin (COUMADIN) 2.5 MG tablet Take 1.5 to 2 tablets by mouth daily as directed by coumadin clinic 50 tablet 3   No current facility-administered medications for this visit.    History   Social History Narrative   Lives with husband and son in [place].  She is currently retired.   Former smoker quit in 1995.   ROS: A comprehensive Review of Systems -was performed Review of Systems  Constitutional: Positive for malaise/fatigue. Negative for fever and chills.       Overall not very active.  HENT: Positive for congestion.   Eyes: Negative.     Respiratory: Positive for cough, sputum production (In the mornings:Thick yellowish sputum), shortness of breath (at baseline) and wheezing. Negative for hemoptysis.        Thick yellowish sputum  Cardiovascular: Positive for chest pain (as per HPI), orthopnea (Stable two-pillow) and leg swelling (Notably improved). Negative for palpitations, claudication and PND.  Gastrointestinal: Negative.  Negative for blood in stool and melena (not truly melena, but is on iron supplement that makes her stool dark).  Genitourinary: Negative.  Negative for dysuria and hematuria.  Musculoskeletal: Positive for joint pain.  Neurological: Negative.  Negative for dizziness, tingling, tremors, sensory change, speech change, focal weakness, seizures and loss of consciousness.  Psychiatric/Behavioral: Positive for memory loss (Admits to not remembering things). Negative for depression. The patient is not nervous/anxious.   All other systems reviewed and are negative.  PHYSICAL  EXAM BP 119/74 mmHg  Pulse 126  Ht 5\' 2"  (1.575 m)  Wt 257 lb 11.2 oz (116.892 kg)  BMI 47.12 kg/m2 General appearance: morbidly obese, pleasant woman. A&Ox3 Answers questions appropriately HEENT: [place]/AT, MMM, anicteric sclera; R eye s/p corneal transplant with irregular pupil and has mild lateral drift with reduced abduction Neck: no adenopathy, no carotid bruit, no JVD and supple, symmetrical, trachea midline Lungs: Mild interstitial sounds bilaterally with faint expiratory wheeze, normal percussion bilaterally and nonlabored, good air movement Heart: regular rate and rhythm, S1, S2 normal, no murmur, click, rub or gallop and normal apical impulse Abdomen: soft, non-tender; bowel sounds normal; no masses,  no organomegaly Extremities: no ulcers, gangrene or trophic changes and ~ 1-2+ bilateral lower extremity edema. Pulses: 2+ and symmetric Skin: Skin color, texture, turgor normal. No rashes or lesions Neurologic: Grossly normal    Adult ECG Report - Atrial fibrillation versus flutter with variable block. Nonspecific IVCD/LBBB with Left Axis Deviation; cannot exclude lateral infarct, age undetermined.  Stable EKG  Recent Labs: none since discharge  ASSESSMENT / PLAN: Abnormal nuclear stress test Very disconcerting findings on Myoview. Her EF was not evaluated by the nuclear stress test as a result of atrial fibrillation. She could potentially have been artifactual defect relating to her conduction delay, however the distribution is relatively large. Unfortunately, the followup echo did not describe any potential wall motion abnormality. With a intermediate risk stress test and a large defect that could potentially lead to her low EF, but could also potentially indicate significant ischemia the patient with exertional dyspnea, I think it reasonable to evaluate for graft patency.  Plan: Confirmatory evaluation with a cardiac catheterization. She is asked that we do this during my next available Date which would be in January. As she is not having active symptoms this should be acceptable.  Cardiac Catheterization Consent: Left Heart Catheterization with Native Coronary and Graft Angiography +/- PCI The procedure with Risks/Benefits/Alternatives and Indications was reviewed with the patient.  All questions were answered.    Risks / Complications include, but not limited to: Death, MI, CVA/TIA, VF/VT (with defibrillation), Bradycardia (need for temporary pacer placement), contrast induced nephropathy , bleeding / bruising / hematoma / pseudoaneurysm, vascular or coronary injury (with possible emergent CT or Vascular Surgery), adverse medication reactions, infection.  Additional risks involving the use of radiation with the possibility of radiation burns and cancer were explained in detail.  The patient voices understanding and agrees to proceed.     Pre-operative clearance Unfortunately with her history, she does have  relatively significant lung disease, and now has an abnormal stress test. This should need to be evaluated prior to Foley judging her perioperative risk. If there is a significant lesion, and PCI's recommended that I would consider bare-metal stent. The desire for breast surgery would be to perform it relatively soon to avoid potential metastatic spread.   CAD status post CABG She is on aspirin along with beta blocker or, ACE inhibitor and statin. Grossly abnormal stress test despite no true anginal symptoms. She doesn't exertional dyspnea which is chronic and cannot be discounted. Notably, however she had anginal discomfort at the time of her MI in February of 2015.  We will get a better evaluation of her anatomy by catheterization in January 2016.  ICM EF by Echo 20-25% Feb 2015, recently improved to 40-45% by echo 01/13/14 I plan to recheck an echocardiogram in July. However this did not occur. Pending the results of her catheterization, would consider  rechecking an echocardiogram to get a better assessment of her ejection fraction. Other than exertional dyspnea but no active heart failure symptoms. She is on high-dose diuretic but seems stable from an edema standpoint. No further than the standard 2 pillows orthopnea  Essential hypertension Stable overall also be monitored by a nephrologist. At present she is now on an ACE inhibitor apparently restarted prior to this visit.  Hyperlipidemia with target LDL less than 70 Last lipid evaluation showed well-controlled lipids back in February. Since would have her get preoperative/precath labs checked him before cath in January, we'll go ahead and check a fasting lipid panel at that time along with CBC and chemistries  DOE (dyspnea on exertion) Clearly multifactorial with COPD and cardiomyopathy. We will see how much of a component of ischemia is playing a role. We'll also plan to recheck echocardiogram post cath.  Severe obesity (BMI >= 40)   Once we do an ischemic evaluation with catheterization, we'll need to discuss again the importance of getting back into doing some type of activity -- whether it is water aerobics or water walking to help balance out her obesity, I think that any type of activity is acceptable.  PAF- doing MI and perioperative CABG No further episodes of A. Fib as far as I can tell since her bypass surgery. However based on her at high stroke risk, she continues to be on warfarin. This will be held 5 days precath.    Orders Placed This Encounter  Procedures  . CBC  . Basic metabolic panel  . TSH  . Protime-INR  . APTT  . CBC  . EKG 12-Lead  . LEFT HEART CATHETERIZATION WITH CORONARY/GRAFT ANGIOGRAM   No orders of the defined types were placed in this encounter.    Followup: 3 months  Uzziah Rigg W. Ellyn Hack, M.D., M.S. Interventional Cardiology [facility]  Cc: Dr. Donnie Mesa

## 2014-09-08 NOTE — Progress Notes (Signed)
Abnormal lab values called to [employer].N

## 2014-09-08 NOTE — CV Procedure (Signed)
CARDIAC CATHETERIZATION  REPORT  NAME:  Elaine [name]   MRN: [id number] DOB:  [birth year]   ADMIT DATE: 09/08/2014 Procedure Date: 09/08/2014  INTERVENTIONAL CARDIOLOGIST: Leonie Man, M.D., MS PRIMARY CARE PROVIDER: Marton Redwood, MD PRIMARY CARDIOLOGIST: Leonie Man, MD  PATIENT:  Elaine [name] is a 74 y.o. female with a history of left main and diffuse right coronary artery disease diagnosed in the setting of initial evaluation for A. fib RVR and non-STEMI in February 2015. She was found to have a significantly reduced EF of 20-25% and went for urgent CABG 3. She also left atrial appendage clipping. Her EF improved to 40-45% after revascularization. She has long-standing COPD with exertional dyspnea that makes it difficult to ascertain the nature of her exertional dyspnea.  She was being evaluated for preoperative risk assessment for a breast lump biopsy/lumpectomy surgery. She had a Myoview stress test that suggested Intermediate Risk with anterior ischemia. She is therefore referred for invasive evaluation to confirm or deny the presence of a large anterior defect.,   PRE-OPERATIVE DIAGNOSIS:    Exertional Dyspnea  Preoperative Cardiovascular Risk Assessment  Abnormal, Intermediate Risk Myoview Stress Test  PROCEDURES PERFORMED:    Left Heart Catheterization with Native Coronary And Graft Angiography  via Right Common Femoral Artery   Left Ventriculography  PROCEDURE: The patient was brought to the [address] [facility] in the fasting state and prepped and draped in the usual sterile fashion for right femoral artery access.  Femoral access was chosen as the patient has chronic renal insufficiency. Sterile technique was used including antiseptics, cap, gloves, gown, hand hygiene, mask and sheet. Skin prep: Chlorhexidine.   Consent: Risks of procedure as well as the alternatives and risks of each were explained to the (patient/caregiver). Consent  for procedure obtained.   Time Out: Verified patient identification, verified procedure, site/side was marked, verified correct patient position, special equipment/implants available, medications/allergies/relevent history reviewed, required imaging and test results available. Performed.  Access:   Right Common Femoral Artery: 5 Fr Sheath -  fluoroscopically guided modified Seldinger Technique    Left Heart Catheterization: 5 Fr Catheters advanced or exchanged over a standard J-wire; JL4 catheter advanced first.  Left Coronary Artery Cineangiography: JL4 Catheter  Right Coronary Artery & SVG-OM Cineangiography: JR4 Catheter  SVG-RPL : After multiple attempts with several catheters, and MPA 2 catheter successfully engaged the graft. LIMA-LAD Cineangiography: JR4 Catheter redirected into Left Subclavian Artery & exchanged over long-exchange wire for IMA catheter.  LV Hemodynamics (LV Gram): AL-1  Sheath removed in the PACU holding area with manual pressure for hemostasis.   FINDINGS:  Hemodynamics:   Central Aortic Pressure / Mean: 122/59/85 mmHg  Left Ventricular Pressure / LVEDP: 122/10/15 mmHg  Left Ventriculography: Deferred  Coronary Anatomy:  Left Main: Large-caliber vessel it tapers to a 70% distal stenosis that is somewhat hazy in appearance. The vessel bifurcates into the LAD And Circumflex. LAD: Moderate large-caliber vessel with ostial 70% lesion followed by tandem 95% and 80% stenoses prior to SP1. 100% occluded after D1  D1: Small-caliber vessel, diffusely diseased  Left Circumflex: Large-caliber vessel that gives off a proximal obtuse marginal and then terminates as a large lateral OM 2 the OM 2 has proximal roughly 40% disease with several distal branches.  There is competetive flow noted from the SVG-OM   RCA: Moderate large-caliber, dominant vessel with early mid 60-70% lesion at the first bend followed by a roughly 70% focal lesion just prior to  the crux. The  vessel then bifurcates distally into the Right Posterior Descending Artery (RPDA) Right Posterior AV Groove Branch (RPAV)  RPDA: Moderate caliber vessel that reaches almost to the apex. Diffuse luminal irregularities  RPL Sysytem: competitive flow precludes full visualization.  Graft Anatomy:  LIMA-LAD: Widely patent artery to the mid LAD. The LAD distally is diffusely diseased but no change from the preop films.  There is diffuse 50-60% disease and a small caliber downstream LAD as it tapers down to the apex.  SVG-OM1: Widely patent graft to a large lateral OM.  SVG:RPL:  Widely patent graft actually arises from just above the marker. The graft is to the proximal PL and retrograde fills the PDA.  The RPAV begins is a moderate caliber vessel with really gives off one major posterior lateral branch with several small branches and AV nodal artery. Diffuse luminal irregularities.  MEDICATIONS:  Anesthesia:  Local Lidocaine 14 ml  Sedation:  1 mg IV Versed, 25 mcg IV fentanyl ;   Premedication: An additional 2 hours of IV fluid hydration  Omnipaque Contrast: 130 ml  PATIENT DISPOSITION:    The patient was transferred to the PACU holding area in a hemodynamicaly stable, chest pain free condition.  The patient tolerated the procedure well, and there were no complications.  EBL:   < 10 ml  The patient was stable before, during, and after the procedure.  POST-OPERATIVE DIAGNOSIS:    Severe native CAD with occluded LAD after 70-80% Left Main, and diffuse RCA disease as previously described/   Widely patent grafts to moderately diseased target vessels.  Known mild to moderate LV dysfunction, and chronic atrial fibrillation Likely False Positive Myoview Stress Test  PLAN OF CARE:  Post procedure care.  Discharge from short stay today.   Restart warfarin tonight  Follow-up with me as scheduled  Her cardiovascular risk assessment standpoint, the stress test with be considered  to be probably False Positive. She should be fine for her low risk surgery.   Leonie Man, M.D., M.S. Interventional Cardiologist   Pager # [phone]

## 2014-09-14 ENCOUNTER — Telehealth: Payer: Self-pay | Admitting: Cardiology

## 2014-09-14 NOTE — Telephone Encounter (Signed)
She just had a cath last week - false + Myoview. I would consider her to be at least Moderate to High Risk for a Low Risk Surgery.  Has CAD - but no obvious ischemia, Cardiomyopathy with h/o CHF, DM (just not on insulin), & Cr @ baseline of ~2. Not quite 3 RFs by criteria - but pretty close to 4 overall -- >=3 RFs makes a patient High Risk.  Would not need any further testing - continue Beta Blocker.  [place]

## 2014-09-14 NOTE — Telephone Encounter (Signed)
Pt. Needs clearance for lumpectomy of her breast

## 2014-09-14 NOTE — Telephone Encounter (Signed)
Deneise Lever is called in wanting to follow up on the pt's status for her cardiac clearance needed for her  rt lumpectomy. Please call  The fax # is [phone]  Thanks

## 2014-09-15 ENCOUNTER — Encounter: Payer: Self-pay | Admitting: *Deleted

## 2014-09-15 ENCOUNTER — Telehealth: Payer: Self-pay | Admitting: *Deleted

## 2014-09-15 NOTE — Telephone Encounter (Signed)
RN left message to call back in regards cardiac clearance

## 2014-09-15 NOTE — Telephone Encounter (Signed)
Dr. Ellyn Hack has cleared this pt for surgery with a moderate risk, letter faxed to Dr. Georgette Dover

## 2014-09-15 NOTE — Telephone Encounter (Signed)
RN informed Elaine [name]- cardiac clearance for surgery done and faxed

## 2014-09-16 ENCOUNTER — Other Ambulatory Visit (INDEPENDENT_AMBULATORY_CARE_PROVIDER_SITE_OTHER): Payer: Self-pay | Admitting: Surgery

## 2014-09-16 ENCOUNTER — Other Ambulatory Visit: Payer: Self-pay

## 2014-09-16 DIAGNOSIS — N6091 Unspecified benign mammary dysplasia of right breast: Secondary | ICD-10-CM

## 2014-09-21 ENCOUNTER — Telehealth: Payer: Self-pay | Admitting: *Deleted

## 2014-09-21 NOTE — Telephone Encounter (Signed)
Open error 

## 2014-09-23 ENCOUNTER — Ambulatory Visit (INDEPENDENT_AMBULATORY_CARE_PROVIDER_SITE_OTHER): Payer: Medicare HMO | Admitting: Pharmacist Clinician (PhC)/ Clinical Pharmacy Specialist

## 2014-09-23 DIAGNOSIS — I4891 Unspecified atrial fibrillation: Secondary | ICD-10-CM

## 2014-09-23 DIAGNOSIS — Z7901 Long term (current) use of anticoagulants: Secondary | ICD-10-CM

## 2014-09-23 DIAGNOSIS — I480 Paroxysmal atrial fibrillation: Secondary | ICD-10-CM

## 2014-09-23 LAB — POCT INR: INR: 2.3

## 2014-09-27 NOTE — Progress Notes (Signed)
This encounter was created in error - please disregard.

## 2014-09-28 ENCOUNTER — Encounter: Payer: Self-pay | Admitting: Thoracic Surgery (Cardiothoracic Vascular Surgery)

## 2014-09-28 ENCOUNTER — Ambulatory Visit (INDEPENDENT_AMBULATORY_CARE_PROVIDER_SITE_OTHER): Payer: Medicare HMO | Admitting: Thoracic Surgery (Cardiothoracic Vascular Surgery)

## 2014-09-28 VITALS — BP 121/86 | HR 122 | Resp 18 | Ht 62.0 in | Wt 250.0 lb

## 2014-09-28 DIAGNOSIS — Z951 Presence of aortocoronary bypass graft: Secondary | ICD-10-CM

## 2014-09-28 DIAGNOSIS — I2510 Atherosclerotic heart disease of native coronary artery without angina pectoris: Secondary | ICD-10-CM

## 2014-09-28 NOTE — Progress Notes (Signed)
[address]       [place] [address]             [place] OFFICE NOTE  Referring Provider is Leonie Man, MD PCP is Marton Redwood, MD   HPI:  Patient returns for routine followup nearly 1 year status post coronary artery bypass grafting x3 with clipping of LA appendage on 10/23/2013. She originally presented with an acute non-ST segment elevation myocardial infarction in the setting of rapid atrial fibrillation. She was last seen here in our office on 01/05/2014. Since then she has done well from a cardiac standpoint and she has been followed carefully by Dr. Ellyn Hack. Late follow-up echocardiogram performed 01/13/2014 revealed some improvement in left ventricular systolic function with ejection fraction estimated 40-45%.  She was sent back to see Dr. Ellyn Hack in December for cardiac clearance prior to anticipated breast surgery for a mass in her right breast. Nuclear stress test was Felt to be intermediate risk with a "large, severe intensity perfusion defect of the anterior wall, apex and inferior apex which was partially reversible, suggesting scar or possibly hibernating myocardium."  She subsequently underwent cardiac catheterization for definitive clearance on 09/08/2014. This revealed that all of her bypass grafts were widely patent. Her breast surgery has been scheduled for next week.  The patient reports that she is currently doing well. She experiences chronic shortness of breath and fatigue with exertion, but overall she states that she feels much better than she did prior to her heart surgery last February. She has never had any exertional chest pain or chest tightness. She denies any worsening symptoms of dyspnea and her activity level is stable. She is able to attend to her normal daily activities without significant limitation. She is limited more by problems with severe arthritis and decreased mobility. She claims that her  blood sugars have been under fairly good control. She has been compliant with all of her medications.   Current Outpatient Prescriptions  Medication Sig Dispense Refill  . acetaminophen (TYLENOL ARTHRITIS PAIN) 650 MG CR tablet Take 1,300 mg by mouth daily as needed for pain.     [name] aspirin EC 81 MG tablet Take 1 tablet (81 mg total) by mouth daily.    . carvedilol (COREG) 6.25 MG tablet Take 1 tablet (6.25 mg total) by mouth 2 (two) times daily with a meal. 60 tablet 11  . ciprofloxacin (CIPRO) 250 MG tablet Take 250 mg by mouth 2 (two) times daily. 7 day supply    . febuxostat (ULORIC) 40 MG tablet Take 40 mg by mouth daily.    . ferrous sulfate 325 (65 FE) MG tablet Take 1 tablet (325 mg total) by mouth daily with breakfast. 30 tablet 10  . furosemide (LASIX) 80 MG tablet Take 80 mg by mouth 2 (two) times daily.     [name] ipratropium-albuterol (DUONEB) 0.5-2.5 (3) MG/3ML SOLN Take 3 mLs by nebulization every 4 (four) hours as needed (for shortness of breath).     [name] levothyroxine (SYNTHROID) 112 MCG tablet Take 1 tablet (112 mcg total) by mouth daily before breakfast. 30 tablet 12  . lisinopril (PRINIVIL,ZESTRIL) 2.5 MG tablet Take 2.5 mg by mouth daily.    [name] oxycodone (OXY-IR) 5 MG capsule Take 5 mg by mouth daily as needed for pain.     . prednisoLONE acetate (PRED FORTE) 1 % ophthalmic suspension Place 1 drop into the right eye daily.    [name]  rosuvastatin (CRESTOR) 20 MG tablet Take 10 mg by mouth at bedtime.     . SYMBICORT 160-4.5 MCG/ACT inhaler Inhale 2 puffs into the lungs 2 (two) times daily as needed (for shortness of breath or wheezing).     . traMADol (ULTRAM) 50 MG tablet Take 1 tablet by mouth 2 (two) times daily as needed.    . warfarin (COUMADIN) 2.5 MG tablet Take 1.5 to 2 tablets by mouth daily as directed by coumadin clinic (Patient taking differently: Take 2.5 mg by mouth See admin instructions. Take 5mg  by mouth on Monday, then take 3.75mg  by mouth on all other days.) 50 tablet 3     No current facility-administered medications for this visit.      Physical Exam:   BP 121/86 mmHg  Pulse 122  Resp 18  Ht 5\' 2"  (1.575 m)  Wt 250 lb (113.399 kg)  BMI 45.71 kg/m2  SpO2 94%  General:  Morbidly obese but well appearing  Chest:   Clear to auscultation  CV:   Irregular rate and rhythm without murmur  Incisions:  Completely healed, sternum is stable  Abdomen:  Soft and nontender  Extremities:  Warm and well-perfused  Diagnostic Tests:  Transthoracic Echocardiography  Patient:  Esti, Vuncannon MR #:    [id number] Study Date: 01/13/2014 Gender:   F Age:    74 Height:   157.5 cm Weight:   108 kg BSA:    2.24 m^2 Pt. Status: Room:  ATTENDING  Maralyn Sago SONOGRAPHER Marygrace Drought, RCS PERFORMING  [facility], Outpatient  cc:  ------------------------------------------------------------------- LV EF: 40% -  45%  ------------------------------------------------------------------- Indications:   Cardiomyopathy - ischemic 414.8.  ------------------------------------------------------------------- History:  PMH:  Dyspnea. Atrial fibrillation.  ------------------------------------------------------------------- Study Conclusions  - Left ventricle: Septal and apical hypokinesis The cavity size was mildly dilated. Wall thickness was increased in a pattern of mild LVH. Systolic function was mildly to moderately reduced. The estimated ejection fraction was in the range of 40% to 45%. - Mitral valve: Calcified annulus. Mildly thickened leaflets . - Left atrium: The atrium was moderately dilated. - Atrial septum: No defect or patent foramen ovale was identified.  ------------------------------------------------------------------- Labs, prior tests, procedures, and surgery: Coronary artery bypass  grafting.  -------------------------------------------------------------------  ------------------------------------------------------------------- Left ventricle: Septal and apical hypokinesis The cavity size was mildly dilated. Wall thickness was increased in a pattern of mild LVH. Systolic function was mildly to moderately reduced. The estimated ejection fraction was in the range of 40% to 45%.  ------------------------------------------------------------------- Aortic valve:  Mildly calcified leaflets. Doppler:  There was no stenosis.  ------------------------------------------------------------------- Aorta: The aorta was normal, not dilated, and non-diseased.  ------------------------------------------------------------------- Mitral valve:  Calcified annulus. Mildly thickened leaflets . Doppler: There was trivial regurgitation.  Peak gradient (D): 2 mm Hg.  ------------------------------------------------------------------- Left atrium: LA volume/ BSA = 27.2 ml/m2. The atrium was moderately dilated.  ------------------------------------------------------------------- Atrial septum: No defect or patent foramen ovale was identified.  ------------------------------------------------------------------- Right ventricle: The cavity size was normal. Wall thickness was normal. Systolic function was normal.  ------------------------------------------------------------------- Pulmonic valve:  Structurally normal valve.  Cusp separation was normal. Doppler: Transvalvular velocity was within the normal range. There was mild regurgitation.  ------------------------------------------------------------------- Tricuspid valve:  Structurally normal valve.  Leaflet separation was normal. Doppler: Transvalvular velocity was within the normal range. There was mild regurgitation.  ------------------------------------------------------------------- Pericardium: The  pericardium was normal in appearance.  ------------------------------------------------------------------- Systemic veins: Inferior vena cava: The vessel was normal in  size. The respirophasic diameter changes were in the normal range (= 50%), consistent with normal central venous pressure. Diameter: 14 mm.  ------------------------------------------------------------------- Post procedure conclusions Ascending Aorta:  - The aorta was normal, not dilated, and non-diseased.  ------------------------------------------------------------------- Prepared and Electronically Authenticated by  Jenkins Rouge 2015-05-19T15:27:04  ------------------------------------------------------------------- Measurements  IVC              Value    10/20/2013 Reference ID               14  mm   ---------- -----------  Left ventricle         Value    10/20/2013 Reference LV ID, ED, PLAX    (L)   4.9  cm   4.8    43.0 - 52.0 chordal LV ID, ES, PLAX    (L)   3.4  cm   4.2    23.0 - 38.0 chordal LV fx shortening, PLAX (N)   30  %   14     >=29 chordal LV PW thickness, ED      1.1  cm   1.5    ----------- IVS/LV PW ratio, ED  (N)   1.17     0.88    <=1.3 Stroke volume, 2D       71  ml   ---------- ----------- Stroke volume/bsa, 2D     32  ml/m^2 ---------- ----------- LV e&', lateral         8.8  m/sec ---------- ----------- LV E/e&', lateral        0      ---------- ----------- LV e&', medial         8.2  m/sec ---------- ----------- LV E/e&', medial        0      ---------- ----------- LV e&', average         8.5  m/sec ---------- ----------- LV E/e&', average        0      ---------- -----------  Ventricular septum       Value    10/20/2013 Reference IVS thickness, ED        1.3  cm   1.3    -----------  LVOT              Value    10/20/2013 Reference LVOT ID, S           2.0  cm   ---------- ----------- LVOT area           3   cm^2  ---------- -----------  Aorta             Value    10/20/2013 Reference Aortic root ID, ED       3.7  cm   3.2    -----------  Left atrium          Value    10/20/2013 Reference LA ID, A-P, ES         4.3  cm   4.5    ----------- LA ID/bsa, A-P     (N)   1.9  cm/m^2 1.9    <=2.2  Mitral valve          Value    10/20/2013 Reference Mitral E-wave peak       0.79 m/sec 1.03    ----------- velocity Mitral A-wave peak       0.66 m/sec 0.98    ----------- velocity Mitral deceleration  (N)   218  ms  106    150 - 230 time Mitral peak gradient,     2   mm Hg 4     ----------- D Mitral E/A ratio, peak     1.2     1     -----------  Pulmonary arteries       Value    10/20/2013 Reference PA pressure, S, DP   (N)   30  mm Hg ---------- <=30  Tricuspid valve        Value    10/20/2013 Reference Tricuspid regurg peak     2.1  m/sec ---------- ----------- velocity Tricuspid peak RV-RA      18  mm Hg 36     ----------- gradient Tricuspid maximal       2.12 m/sec ---------- ----------- regurg velocity, PISA  Systemic veins         Value    10/20/2013 Reference Estimated CVP         3   mm Hg 8     -----------  Right ventricle        Value    10/20/2013 Reference RV pressure, S, DP   (N)   30  mm Hg ---------- <=30 RV s&', lateral, S       6.3  m/sec ---------- -----------  Legend: (L) and (H) mark values outside specified reference range.  (N) marks values inside specified  reference range.    CARDIAC CATHETERIZATION REPORT  NAME: Alexiea Rossetti [id number] DOB: 07/11/1942ADMIT DATE: 09/08/2014 Procedure Date: 09/08/2014  INTERVENTIONAL CARDIOLOGIST: Leonie Man, M.D., MS PRIMARY CARE PROVIDER: Marton Redwood, MD PRIMARY CARDIOLOGIST: Leonie Man, MD  PATIENT: MIEKA FERDIG is a 74 y.o. female with a history of left main and diffuse right coronary artery disease diagnosed in the setting of initial evaluation for A. fib RVR and non-STEMI in February 2015. She was found to have a significantly reduced EF of 20-25% and went for urgent CABG 3. She also left atrial appendage clipping. Her EF improved to 40-45% after revascularization. She has long-standing COPD with exertional dyspnea that makes it difficult to ascertain the nature of her exertional dyspnea.  She was being evaluated for preoperative risk assessment for a breast lump biopsy/lumpectomy surgery. She had a Myoview stress test that suggested Intermediate Risk with anterior ischemia. She is therefore referred for invasive evaluation to confirm or deny the presence of a large anterior defect.,   PRE-OPERATIVE DIAGNOSIS:   Exertional Dyspnea  Preoperative Cardiovascular Risk Assessment  Abnormal, Intermediate Risk Myoview Stress Test  PROCEDURES PERFORMED:   Left Heart Catheterization with Native Coronary And Graft Angiography via Right Common Femoral Artery   Left Ventriculography  PROCEDURE: The patient was brought to the [facility] in the fasting state and prepped and draped in the usual sterile fashion for right femoral artery access. Femoral access was chosen as the patient has chronic renal insufficiency. Sterile technique was used including antiseptics, cap, gloves, gown, hand hygiene, mask and sheet. Skin prep: Chlorhexidine.   Consent: Risks of procedure as well as  the alternatives and risks of each were explained to the (patient/caregiver). Consent for procedure obtained.   Time Out: Verified patient identification, verified procedure, site/side was marked, verified correct patient position, special equipment/implants available, medications/allergies/relevent history reviewed, required imaging and test results available. Performed.  Access:   Right Common Femoral Artery: 5 Fr Sheath - fluoroscopically guided modified Seldinger Technique     Left Heart Catheterization: 5 Fr Catheters advanced  or exchanged over a standard J-wire; JL4 catheter advanced first.   Left Coronary Artery Cineangiography: JL4 Catheter   Right Coronary Artery & SVG-OM Cineangiography: JR4 Catheter   SVG-RPL : After multiple attempts with several catheters, and MPA 2 catheter successfully engaged the graft.  LIMA-LAD Cineangiography: JR4 Catheter redirected into Left Subclavian Artery & exchanged over long-exchange wire for IMA catheter.  LV Hemodynamics (LV Gram): AL-1  Sheath removed in the PACU holding area with manual pressure for hemostasis.   FINDINGS:  Hemodynamics:   Central Aortic Pressure / Mean: 122/59/85 mmHg  Left Ventricular Pressure / LVEDP: 122/10/15 mmHg  Left Ventriculography: Deferred  Coronary Anatomy:  Left Main: Large-caliber vessel it tapers to a 70% distal stenosis that is somewhat hazy in appearance. The vessel bifurcates into the LAD And Circumflex. LAD: Moderate large-caliber vessel with ostial 70% lesion followed by tandem 95% and 80% stenoses prior to SP1. 100% occluded after D1  D1: Small-caliber vessel, diffusely diseased  Left Circumflex: Large-caliber vessel that gives off a proximal obtuse marginal and then terminates as a large lateral OM 2 the OM 2 has proximal roughly 40% disease with several distal branches. There is competetive flow noted from the SVG-OM   RCA: Moderate large-caliber, dominant vessel with early  mid 60-70% lesion at the first bend followed by a roughly 70% focal lesion just prior to the crux. The vessel then bifurcates distally into the Right Posterior Descending Artery (RPDA) Right Posterior AV Groove Branch (RPAV)  RPDA: Moderate caliber vessel that reaches almost to the apex. Diffuse luminal irregularities  RPL Sysytem: competitive flow precludes full visualization.  Graft Anatomy:  LIMA-LAD: Widely patent artery to the mid LAD. The LAD distally is diffusely diseased but no change from the preop films. There is diffuse 50-60% disease and a small caliber downstream LAD as it tapers down to the apex.  SVG-OM1: Widely patent graft to a large lateral OM.  SVG:RPL: Widely patent graft actually arises from just above the marker. The graft is to the proximal PL and retrograde fills the PDA. The RPAV begins is a moderate caliber vessel with really gives off one major posterior lateral branch with several small branches and AV nodal artery. Diffuse luminal irregularities.  MEDICATIONS:  Anesthesia: Local Lidocaine 14 ml  Sedation: 1 mg IV Versed, 25 mcg IV fentanyl ;   Premedication: An additional 2 hours of IV fluid hydration  Omnipaque Contrast: 130 ml  PATIENT DISPOSITION:   The patient was transferred to the PACU holding area in a hemodynamicaly stable, chest pain free condition.  The patient tolerated the procedure well, and there were no complications. EBL: < 10 ml  The patient was stable before, during, and after the procedure.  POST-OPERATIVE DIAGNOSIS:   Severe native CAD with occluded LAD after 70-80% Left Main, and diffuse RCA disease as previously described/   Widely patent grafts to moderately diseased target vessels.  Known mild to moderate LV dysfunction, and chronic atrial fibrillation Likely False Positive Myoview Stress Test  PLAN OF CARE:  Post procedure care.  Discharge from short stay today.   Restart warfarin tonight  Follow-up  with me as scheduled  Her cardiovascular risk assessment standpoint, the stress test with be considered to be probably False Positive. She should be fine for her low risk surgery.   Leonie Man, M.D., M.S. Interventional Cardiologist   Impression:  Patient is stable from a cardiac standpoint nearly 1 year status post coronary artery bypass grafting 3 and clipping of left  atrial appendage. Recent diagnostic cardiac catheterization demonstrates that all of her bypass grafts are widely patent. Left ventricular systolic function has improved somewhat in comparison with prior to surgery.    Plan:  In the future the patient will call and return to see [place] only as needed. She has been reminded regarding the need for close attention to tight management of her diabetes. She has also been reminded how much benefit she might enjoy if she can find a way to lose a significant amount of weight. The importance of regular exercise and diet has been emphasized. All of her questions have been addressed.  I spent in excess of 10 minutes during the conduct of this office consultation and >50% of this time involved direct face-to-face encounter with the patient for counseling and/or coordination of their care.    Valentina Gu. Roxy Manns, MD 09/28/2014 4:42 PM

## 2014-09-28 NOTE — Patient Instructions (Signed)
The patient is reminded to make every effort to keep their diabetes under very tight control.  They should follow up closely with their primary care physician or endocrinologist and strive to keep their hemoglobin A1c levels as low as possible, preferably near or below 6.0  Patient should make an effort to exercise regularly and lose weight

## 2014-09-29 ENCOUNTER — Telehealth: Payer: Self-pay | Admitting: Pharmacist Clinician (PhC)/ Clinical Pharmacy Specialist

## 2014-09-29 ENCOUNTER — Telehealth: Payer: Self-pay | Admitting: Cardiology

## 2014-09-29 ENCOUNTER — Encounter (HOSPITAL_COMMUNITY)
Admission: RE | Admit: 2014-09-29 | Discharge: 2014-09-29 | Disposition: A | Discharge status: Home / Self Care | Payer: Medicare HMO | Source: Ambulatory Visit | Attending: Surgery | Admitting: Surgery

## 2014-09-29 ENCOUNTER — Encounter (HOSPITAL_COMMUNITY): Payer: Self-pay

## 2014-09-29 DIAGNOSIS — I2510 Atherosclerotic heart disease of native coronary artery without angina pectoris: Secondary | ICD-10-CM | POA: Insufficient documentation

## 2014-09-29 DIAGNOSIS — Z87891 Personal history of nicotine dependence: Secondary | ICD-10-CM | POA: Insufficient documentation

## 2014-09-29 DIAGNOSIS — N189 Chronic kidney disease, unspecified: Secondary | ICD-10-CM | POA: Insufficient documentation

## 2014-09-29 DIAGNOSIS — Z01812 Encounter for preprocedural laboratory examination: Secondary | ICD-10-CM | POA: Insufficient documentation

## 2014-09-29 DIAGNOSIS — N390 Urinary tract infection, site not specified: Secondary | ICD-10-CM

## 2014-09-29 DIAGNOSIS — Z7901 Long term (current) use of anticoagulants: Secondary | ICD-10-CM | POA: Diagnosis not present

## 2014-09-29 DIAGNOSIS — I509 Heart failure, unspecified: Secondary | ICD-10-CM | POA: Insufficient documentation

## 2014-09-29 DIAGNOSIS — N6091 Unspecified benign mammary dysplasia of right breast: Secondary | ICD-10-CM | POA: Insufficient documentation

## 2014-09-29 DIAGNOSIS — I4891 Unspecified atrial fibrillation: Secondary | ICD-10-CM | POA: Diagnosis not present

## 2014-09-29 DIAGNOSIS — J449 Chronic obstructive pulmonary disease, unspecified: Secondary | ICD-10-CM | POA: Insufficient documentation

## 2014-09-29 DIAGNOSIS — Z951 Presence of aortocoronary bypass graft: Secondary | ICD-10-CM | POA: Insufficient documentation

## 2014-09-29 DIAGNOSIS — Z7982 Long term (current) use of aspirin: Secondary | ICD-10-CM | POA: Diagnosis not present

## 2014-09-29 DIAGNOSIS — E119 Type 2 diabetes mellitus without complications: Secondary | ICD-10-CM | POA: Diagnosis not present

## 2014-09-29 DIAGNOSIS — E785 Hyperlipidemia, unspecified: Secondary | ICD-10-CM | POA: Diagnosis not present

## 2014-09-29 HISTORY — DX: Urinary tract infection, site not specified: N39.0

## 2014-09-29 HISTORY — DX: Chronic obstructive pulmonary disease, unspecified: J44.9

## 2014-09-29 LAB — BASIC METABOLIC PANEL
ANION GAP: 7 (ref 5–15)
BUN: 31 mg/dL — AB (ref 6–23)
CO2: 33 mmol/L — ABNORMAL HIGH (ref 19–32)
Calcium: 9.8 mg/dL (ref 8.4–10.5)
Chloride: 99 mmol/L (ref 96–112)
Creatinine, Ser: 2.02 mg/dL — ABNORMAL HIGH (ref 0.50–1.10)
GFR calc non Af Amer: 23 mL/min — ABNORMAL LOW (ref >90)
GFR, EST AFRICAN AMERICAN: 27 mL/min — AB (ref >90)
GLUCOSE: 153 mg/dL — AB (ref 70–99)
Potassium: 5.2 mmol/L — ABNORMAL HIGH (ref 3.5–5.1)
Sodium: 139 mmol/L (ref 135–145)

## 2014-09-29 LAB — CBC
HCT: 37.9 % (ref 36.0–46.0)
Hemoglobin: 11.9 g/dL — ABNORMAL LOW (ref 12.0–15.0)
MCH: 26.7 pg (ref 26.0–34.0)
MCHC: 31.4 g/dL (ref 30.0–36.0)
MCV: 85 fL (ref 78.0–100.0)
Platelets: 246 10*3/uL (ref 150–400)
RBC: 4.46 MIL/uL (ref 3.87–5.11)
RDW: 17.4 % — ABNORMAL HIGH (ref 11.5–15.5)
WBC: 6.7 10*3/uL (ref 4.0–10.5)

## 2014-09-29 LAB — GLUCOSE, CAPILLARY: GLUCOSE-CAPILLARY: 141 mg/dL — AB (ref 70–99)

## 2014-09-29 NOTE — Progress Notes (Signed)
   09/29/14 1035  OBSTRUCTIVE SLEEP APNEA  Have you ever been diagnosed with sleep apnea through a sleep study? No  Do you snore loudly (loud enough to be heard through closed doors)?  0  Do you often feel tired, fatigued, or sleepy during the daytime? 0  Has anyone observed you stop breathing during your sleep? 0  Do you have, or are you being treated for high blood pressure? 1  BMI more than 35 kg/m2? 1  Age over 74 years old? 1  Neck circumference greater than 40 cm/16 inches? 1 (16)  Gender: 0  Obstructive Sleep Apnea Score 4  Score 4 or greater  Results sent to PCP

## 2014-09-29 NOTE — Progress Notes (Addendum)
Anesthesia Chart Review:  Pt is 74 year old female scheduled for R breast lumpectomy with needle localization on 10/06/2014 with Dr. Georgette Dover.   Cardiologist is Dr. Ellyn Hack.   PMH includes: CAD (S/p CABG 09/2013 (LIMA-LAD, SVG-OM1, SVG-RPL)), MI, atrial fibrillation, LBBB, ischemic cardiomyopathy, pulmonary edema with CHF (09/2013), heart murmur, CKD, DM, COPD, hyperlipidemia. Former smoker. BMI 46  Medications include: ASA, coumadin (pt to hold coumadin 5 days prior to surgery).  Preoperative labs reviewed.  Cr 2.02, BUN 31. Pt has CKD; have requested records from [facility].   EKG: atrial fibrillation vs flutter with variable block. L axis deviation. Nonspecific intraventricular block/LBBB. Possible lateral infarct, age undetermined. Stable EKG. Interpreted by Dr. Ellyn Hack.   Echo 01/13/2014: - Left ventricle: Septal and apical hypokinesis The cavity size was mildly dilated. Wall thickness was increased in a pattern of mild LVH. Systolic function was mildly to moderately reduced. The estimated ejection fraction was in the range of 40% to 45%. - Mitral valve: Calcified annulus. Mildly thickened leaflets . - Left atrium: The atrium was moderately dilated. - Atrial septum: No defect or patent foramen ovale was identified.  Cardiac cath 09/08/2014:  Severe native CAD with occluded LAD after 70-80% Left Main, and diffuse RCA disease as previously described/   Widely patent grafts to moderately diseased target vessels.  Known mild to moderate LV dysfunction, and chronic atrial fibrillation Likely False Positive Myoview Stress Test   Pt has cardiac clearance from Dr. Ellyn Hack (in [facility] telephone encounter dated 09/14/2014) at moderate to high risk for this low risk surgery.   Willeen Cass, FNP-BC [facility]/Anesthesiology Phone: [phone] 09/29/2014 4:40 PM  Addendum 10/01/14: Received records from [facility]. Pt does indeed have CKD, stage 3. Last seen there in  07/2014, 4 month f/u recommended. Cr at that time was 2.08.   Willeen Cass, FNP-BC [facility] Short Stay Surgical Center/Anesthesiology Phone: [phone] 10/01/2014 1:03 PM

## 2014-09-29 NOTE — Telephone Encounter (Signed)
Spoke with Elaine [name], advised will hold x 5 days.  She asked that we call patient to inform her.  LMOM for patient

## 2014-09-29 NOTE — Telephone Encounter (Signed)
Pt at [facility] Preadmission in advance of a procedure that is being scheduled for 10/06/14. (R breast needle localized lumpectomy)  She needs direction on when to stop coumadin.  Preoperative clearance note by Dr. Ellyn Hack 09/08/14 following cath. Held coumadin 5 days previous to this procedure.

## 2014-09-29 NOTE — Pre-Procedure Instructions (Addendum)
Elaine [name]  09/29/2014   Your procedure is scheduled on:  10/06/14  Report to [facility] short stay admitting  when done at breast center  Call this number if you have problems the morning of surgery: [phone]   Remember:   Do not eat food or drink liquids after midnight.   Take these medicines the morning of surgery with A SIP OF WATER: carvedilol,cipro,uloric,inhalers/neb(bring inhaler,) levothyroxine, pain med if needed    STOP all herbel meds, nsaids (aleve,naproxen,advil,ibuprofen) 5 days prior to surgery starting 10/01/14 including vitamins, aspirin     Coumadin per dr   Lazaro Arms not wear jewelry, make-up or nail polish.  Do not wear lotions, powders, or perfumes. You may wear deodorant.  Do not shave 48 hours prior to surgery. Men may shave face and neck.  Do not bring valuables to the hospital.  [facility] is not responsible                  for any belongings or valuables.               Contacts, dentures or bridgework may not be worn into surgery.  Leave suitcase in the car. After surgery it may be brought to your room.  For patients admitted to the hospital, discharge time is determined by your                treatment team.               Patients discharged the day of surgery will not be allowed to drive  home.  Name and phone number of your driver:   Special Instructions:  Special Instructions: [place] - Preparing for Surgery  Before surgery, you can play an important role.  Because skin is not sterile, your skin needs to be as free of germs as possible.  You can reduce the number of germs on you skin by washing with CHG (chlorahexidine gluconate) soap before surgery.  CHG is an antiseptic cleaner which kills germs and bonds with the skin to continue killing germs even after washing.  Please DO NOT use if you have an allergy to CHG or antibacterial soaps.  If your skin becomes reddened/irritated stop using the CHG and inform your nurse when you arrive at Short Stay.  Do  not shave (including legs and underarms) for at least 48 hours prior to the first CHG shower.  You may shave your face.  Please follow these instructions carefully:   1.  Shower with CHG Soap the night before surgery and the morning of Surgery.  2.  If you choose to wash your hair, wash your hair first as usual with your normal shampoo.  3.  After you shampoo, rinse your hair and body thoroughly to remove the Shampoo.  4.  Use CHG as you would any other liquid soap.  You can apply chg directly  to the skin and wash gently with scrungie or a clean washcloth.  5.  Apply the CHG Soap to your body ONLY FROM THE NECK DOWN.  Do not use on open wounds or open sores.  Avoid contact with your eyes ears, mouth and genitals (private parts).  Wash genitals (private parts)       with your normal soap.  6.  Wash thoroughly, paying special attention to the area where your surgery will be performed.  7.  Thoroughly rinse your body with warm water from the neck down.  8.  DO NOT shower/wash  with your normal soap after using and rinsing off the CHG Soap.  9.  Pat yourself dry with a clean towel.            10.  Wear clean pajamas.            11.  Place clean sheets on your bed the night of your first shower and do not sleep with pets.  Day of Surgery  Do not apply any lotions/deodorants the morning of surgery.  Please wear clean clothes to the hospital/surgery center.   Please read over the following fact sheets that you were given: Pain Booklet, Coughing and Deep Breathing and Surgical Site Infection Prevention

## 2014-09-29 NOTE — Telephone Encounter (Signed)
Agree  DH 

## 2014-09-29 NOTE — Telephone Encounter (Signed)
Wanted to documentation that ok to hold warfarin, are we doing lovenox bridge.

## 2014-09-29 NOTE — Progress Notes (Signed)
Dr Ellyn Hack called to find out when pt to stop coumadin. Spoke with [employer]. He will speak with dr harding and call patient.

## 2014-10-01 SURGERY — Surgical Case
Anesthesia: *Unknown

## 2014-10-05 MED ORDER — CHLORHEXIDINE GLUCONATE 4 % EX LIQD: 1.0000 "application " | Freq: Once | CUTANEOUS | Status: DC

## 2014-10-06 ENCOUNTER — Ambulatory Visit
Admission: RE | Admit: 2014-10-06 | Discharge: 2014-10-06 | Disposition: A | Discharge status: Home / Self Care | Payer: Medicare HMO | Source: Ambulatory Visit | Attending: Surgery | Admitting: Surgery

## 2014-10-06 ENCOUNTER — Ambulatory Visit (HOSPITAL_COMMUNITY): Payer: Medicare HMO | Admitting: Emergency Medicine

## 2014-10-06 ENCOUNTER — Observation Stay (HOSPITAL_COMMUNITY)
Admission: RE | Admit: 2014-10-06 | Discharge: 2014-10-07 | Disposition: A | Discharge status: Home / Self Care | Payer: Medicare HMO | Source: Ambulatory Visit | Attending: Surgery | Admitting: Surgery

## 2014-10-06 ENCOUNTER — Ambulatory Visit (HOSPITAL_COMMUNITY): Payer: Medicare HMO | Admitting: Anesthesiology

## 2014-10-06 ENCOUNTER — Encounter (HOSPITAL_COMMUNITY)
Admission: RE | Disposition: A | Discharge status: Home / Self Care | Payer: Self-pay | Source: Ambulatory Visit | Attending: Surgery

## 2014-10-06 ENCOUNTER — Encounter (HOSPITAL_COMMUNITY): Payer: Self-pay | Admitting: Certified Registered Nurse Anesthetist

## 2014-10-06 DIAGNOSIS — N6011 Diffuse cystic mastopathy of right breast: Secondary | ICD-10-CM | POA: Diagnosis not present

## 2014-10-06 DIAGNOSIS — N189 Chronic kidney disease, unspecified: Secondary | ICD-10-CM | POA: Diagnosis not present

## 2014-10-06 DIAGNOSIS — N6310 Unspecified lump in the right breast, unspecified quadrant: Secondary | ICD-10-CM | POA: Diagnosis present

## 2014-10-06 DIAGNOSIS — N6091 Unspecified benign mammary dysplasia of right breast: Secondary | ICD-10-CM

## 2014-10-06 DIAGNOSIS — I129 Hypertensive chronic kidney disease with stage 1 through stage 4 chronic kidney disease, or unspecified chronic kidney disease: Secondary | ICD-10-CM | POA: Diagnosis not present

## 2014-10-06 DIAGNOSIS — F1510 Other stimulant abuse, uncomplicated: Secondary | ICD-10-CM | POA: Diagnosis not present

## 2014-10-06 DIAGNOSIS — Z90710 Acquired absence of both cervix and uterus: Secondary | ICD-10-CM | POA: Diagnosis not present

## 2014-10-06 DIAGNOSIS — D241 Benign neoplasm of right breast: Secondary | ICD-10-CM | POA: Insufficient documentation

## 2014-10-06 DIAGNOSIS — I252 Old myocardial infarction: Secondary | ICD-10-CM | POA: Insufficient documentation

## 2014-10-06 DIAGNOSIS — I255 Ischemic cardiomyopathy: Secondary | ICD-10-CM | POA: Insufficient documentation

## 2014-10-06 DIAGNOSIS — I509 Heart failure, unspecified: Secondary | ICD-10-CM | POA: Insufficient documentation

## 2014-10-06 DIAGNOSIS — M1990 Unspecified osteoarthritis, unspecified site: Secondary | ICD-10-CM | POA: Diagnosis not present

## 2014-10-06 DIAGNOSIS — Z86010 Personal history of colonic polyps: Secondary | ICD-10-CM | POA: Insufficient documentation

## 2014-10-06 DIAGNOSIS — Z951 Presence of aortocoronary bypass graft: Secondary | ICD-10-CM | POA: Diagnosis not present

## 2014-10-06 DIAGNOSIS — J449 Chronic obstructive pulmonary disease, unspecified: Secondary | ICD-10-CM | POA: Diagnosis not present

## 2014-10-06 DIAGNOSIS — E119 Type 2 diabetes mellitus without complications: Secondary | ICD-10-CM | POA: Insufficient documentation

## 2014-10-06 DIAGNOSIS — E780 Pure hypercholesterolemia: Secondary | ICD-10-CM | POA: Diagnosis not present

## 2014-10-06 DIAGNOSIS — Z7901 Long term (current) use of anticoagulants: Secondary | ICD-10-CM | POA: Insufficient documentation

## 2014-10-06 DIAGNOSIS — Z87891 Personal history of nicotine dependence: Secondary | ICD-10-CM | POA: Diagnosis not present

## 2014-10-06 DIAGNOSIS — Z9841 Cataract extraction status, right eye: Secondary | ICD-10-CM | POA: Diagnosis not present

## 2014-10-06 HISTORY — PX: BREAST EXCISIONAL BIOPSY: SUR124

## 2014-10-06 HISTORY — PX: BREAST LUMPECTOMY WITH NEEDLE LOCALIZATION: SHX5759

## 2014-10-06 HISTORY — PX: BREAST LUMPECTOMY W/ NEEDLE LOCALIZATION: SHX1266

## 2014-10-06 LAB — CBC
HCT: 36.4 % (ref 36.0–46.0)
Hemoglobin: 11.7 g/dL — ABNORMAL LOW (ref 12.0–15.0)
MCH: 26.6 pg (ref 26.0–34.0)
MCHC: 32.1 g/dL (ref 30.0–36.0)
MCV: 82.7 fL (ref 78.0–100.0)
PLATELETS: 213 10*3/uL (ref 150–400)
RBC: 4.4 MIL/uL (ref 3.87–5.11)
RDW: 16.7 % — AB (ref 11.5–15.5)
WBC: 6 10*3/uL (ref 4.0–10.5)

## 2014-10-06 LAB — GLUCOSE, CAPILLARY
GLUCOSE-CAPILLARY: 173 mg/dL — AB (ref 70–99)
GLUCOSE-CAPILLARY: 160 mg/dL — AB (ref 70–99)
Glucose-Capillary: 137 mg/dL — ABNORMAL HIGH (ref 70–99)

## 2014-10-06 LAB — CREATININE, SERUM
CREATININE: 2.06 mg/dL — AB (ref 0.50–1.10)
GFR calc Af Amer: 26 mL/min — ABNORMAL LOW (ref >90)
GFR, EST NON AFRICAN AMERICAN: 23 mL/min — AB (ref >90)

## 2014-10-06 SURGERY — BREAST LUMPECTOMY WITH NEEDLE LOCALIZATION
Anesthesia: General | Site: Breast | Laterality: Right

## 2014-10-06 MED ORDER — PROPOFOL 10 MG/ML IV BOLUS
INTRAVENOUS | Status: DC | PRN
  Administered 2014-10-06: 60 mg via INTRAVENOUS

## 2014-10-06 MED ORDER — LEVOTHYROXINE SODIUM 112 MCG PO TABS
112.0000 ug | ORAL_TABLET | Freq: Every day | ORAL | Status: DC
  Administered 2014-10-07: 112 ug via ORAL
  Filled 2014-10-06 (×2): qty 1

## 2014-10-06 MED ORDER — SODIUM CHLORIDE 0.9 % IV SOLN
INTRAVENOUS | Status: DC | PRN
  Administered 2014-10-06 (×2): via INTRAVENOUS

## 2014-10-06 MED ORDER — PREDNISOLONE ACETATE 1 % OP SUSP
1.0000 [drp] | Freq: Every day | OPHTHALMIC | Status: DC
  Administered 2014-10-06 – 2014-10-07 (×2): 1 [drp] via OPHTHALMIC
  Filled 2014-10-06: qty 1

## 2014-10-06 MED ORDER — SODIUM CHLORIDE 0.9 % IV SOLN
INTRAVENOUS | Status: DC
  Administered 2014-10-06 (×2): via INTRAVENOUS

## 2014-10-06 MED ORDER — CIPROFLOXACIN HCL 250 MG PO TABS
250.0000 mg | ORAL_TABLET | Freq: Two times a day (BID) | ORAL | Status: DC
  Administered 2014-10-06 – 2014-10-07 (×2): 250 mg via ORAL
  Filled 2014-10-06 (×4): qty 1

## 2014-10-06 MED ORDER — MIDAZOLAM HCL 2 MG/2ML IJ SOLN
INTRAMUSCULAR | Status: AC
  Filled 2014-10-06: qty 2

## 2014-10-06 MED ORDER — HYDROMORPHONE HCL 1 MG/ML IJ SOLN: 0.2500 mg | INTRAMUSCULAR | Status: DC | PRN

## 2014-10-06 MED ORDER — PHENYLEPHRINE 40 MCG/ML (10ML) SYRINGE FOR IV PUSH (FOR BLOOD PRESSURE SUPPORT)
PREFILLED_SYRINGE | INTRAVENOUS | Status: AC
  Filled 2014-10-06: qty 10

## 2014-10-06 MED ORDER — PHENYLEPHRINE HCL 10 MG/ML IJ SOLN
INTRAMUSCULAR | Status: DC | PRN
  Administered 2014-10-06: 120 ug via INTRAVENOUS
  Administered 2014-10-06 (×2): 80 ug via INTRAVENOUS
  Administered 2014-10-06: 120 ug via INTRAVENOUS
  Administered 2014-10-06: 80 ug via INTRAVENOUS

## 2014-10-06 MED ORDER — LISINOPRIL 2.5 MG PO TABS
2.5000 mg | ORAL_TABLET | Freq: Every day | ORAL | Status: DC
  Administered 2014-10-07: 2.5 mg via ORAL
  Filled 2014-10-06 (×2): qty 1

## 2014-10-06 MED ORDER — MIDAZOLAM HCL 5 MG/5ML IJ SOLN
INTRAMUSCULAR | Status: DC | PRN
  Administered 2014-10-06: 2 mg via INTRAVENOUS

## 2014-10-06 MED ORDER — CARVEDILOL 6.25 MG PO TABS
6.2500 mg | ORAL_TABLET | Freq: Two times a day (BID) | ORAL | Status: DC
  Administered 2014-10-06 – 2014-10-07 (×2): 6.25 mg via ORAL
  Filled 2014-10-06 (×4): qty 1

## 2014-10-06 MED ORDER — CEFAZOLIN SODIUM-DEXTROSE 2-3 GM-% IV SOLR
INTRAVENOUS | Status: DC | PRN
  Administered 2014-10-06: 2 g via INTRAVENOUS

## 2014-10-06 MED ORDER — HYDROMORPHONE HCL 1 MG/ML IJ SOLN: 1.0000 mg | INTRAMUSCULAR | Status: DC | PRN

## 2014-10-06 MED ORDER — ONDANSETRON HCL 4 MG PO TABS: 4.0000 mg | ORAL_TABLET | Freq: Four times a day (QID) | ORAL | Status: DC | PRN

## 2014-10-06 MED ORDER — ASPIRIN EC 81 MG PO TBEC
81.0000 mg | DELAYED_RELEASE_TABLET | Freq: Every day | ORAL | Status: DC
  Administered 2014-10-07: 81 mg via ORAL
  Filled 2014-10-06: qty 1

## 2014-10-06 MED ORDER — FENTANYL CITRATE 0.05 MG/ML IJ SOLN
INTRAMUSCULAR | Status: DC | PRN
  Administered 2014-10-06: 50 ug via INTRAVENOUS

## 2014-10-06 MED ORDER — IPRATROPIUM-ALBUTEROL 0.5-2.5 (3) MG/3ML IN SOLN: 3.0000 mL | RESPIRATORY_TRACT | Status: DC | PRN

## 2014-10-06 MED ORDER — FEBUXOSTAT 40 MG PO TABS
40.0000 mg | ORAL_TABLET | Freq: Every day | ORAL | Status: DC
  Administered 2014-10-07: 40 mg via ORAL
  Filled 2014-10-06 (×2): qty 1

## 2014-10-06 MED ORDER — BUPIVACAINE-EPINEPHRINE 0.25% -1:200000 IJ SOLN
INTRAMUSCULAR | Status: DC | PRN
  Administered 2014-10-06: 10 mL

## 2014-10-06 MED ORDER — ONDANSETRON HCL 4 MG/2ML IJ SOLN
INTRAMUSCULAR | Status: AC
  Filled 2014-10-06: qty 2

## 2014-10-06 MED ORDER — ONDANSETRON HCL 4 MG/2ML IJ SOLN
INTRAMUSCULAR | Status: DC | PRN
  Administered 2014-10-06: 4 mg via INTRAVENOUS

## 2014-10-06 MED ORDER — OXYCODONE-ACETAMINOPHEN 5-325 MG PO TABS: 1.0000 | ORAL_TABLET | ORAL | Status: DC | PRN

## 2014-10-06 MED ORDER — STERILE WATER FOR INJECTION IJ SOLN
INTRAMUSCULAR | Status: AC
  Filled 2014-10-06: qty 10

## 2014-10-06 MED ORDER — EPHEDRINE SULFATE 50 MG/ML IJ SOLN
INTRAMUSCULAR | Status: DC | PRN
  Administered 2014-10-06 (×3): 5 mg via INTRAVENOUS
  Administered 2014-10-06: 10 mg via INTRAVENOUS

## 2014-10-06 MED ORDER — EPHEDRINE SULFATE 50 MG/ML IJ SOLN
INTRAMUSCULAR | Status: AC
  Filled 2014-10-06: qty 1

## 2014-10-06 MED ORDER — BUDESONIDE-FORMOTEROL FUMARATE 160-4.5 MCG/ACT IN AERO: 2.0000 | INHALATION_SPRAY | Freq: Two times a day (BID) | RESPIRATORY_TRACT | Status: DC | PRN

## 2014-10-06 MED ORDER — LIDOCAINE HCL (CARDIAC) 20 MG/ML IV SOLN
INTRAVENOUS | Status: AC
  Filled 2014-10-06: qty 5

## 2014-10-06 MED ORDER — PROPOFOL 10 MG/ML IV BOLUS
INTRAVENOUS | Status: AC
  Filled 2014-10-06: qty 20

## 2014-10-06 MED ORDER — FERROUS SULFATE 325 (65 FE) MG PO TABS
325.0000 mg | ORAL_TABLET | Freq: Every day | ORAL | Status: DC
  Administered 2014-10-07: 325 mg via ORAL
  Filled 2014-10-06 (×2): qty 1

## 2014-10-06 MED ORDER — ENOXAPARIN SODIUM 30 MG/0.3ML ~~LOC~~ SOLN
30.0000 mg | SUBCUTANEOUS | Status: DC
  Administered 2014-10-07: 30 mg via SUBCUTANEOUS
  Filled 2014-10-06 (×2): qty 0.3

## 2014-10-06 MED ORDER — FENTANYL CITRATE 0.05 MG/ML IJ SOLN
INTRAMUSCULAR | Status: AC
  Filled 2014-10-06: qty 5

## 2014-10-06 MED ORDER — FUROSEMIDE 80 MG PO TABS
80.0000 mg | ORAL_TABLET | Freq: Two times a day (BID) | ORAL | Status: DC
  Administered 2014-10-06 – 2014-10-07 (×2): 80 mg via ORAL
  Filled 2014-10-06 (×3): qty 1

## 2014-10-06 MED ORDER — 0.9 % SODIUM CHLORIDE (POUR BTL) OPTIME
TOPICAL | Status: DC | PRN
  Administered 2014-10-06: 1000 mL

## 2014-10-06 MED ORDER — ONDANSETRON HCL 4 MG/2ML IJ SOLN: 4.0000 mg | Freq: Four times a day (QID) | INTRAMUSCULAR | Status: DC | PRN

## 2014-10-06 MED ORDER — ROSUVASTATIN CALCIUM 10 MG PO TABS
10.0000 mg | ORAL_TABLET | Freq: Every day | ORAL | Status: DC
  Administered 2014-10-06: 10 mg via ORAL
  Filled 2014-10-06 (×2): qty 1

## 2014-10-06 SURGICAL SUPPLY — 49 items
APL SKNCLS STERI-STRIP NONHPOA (GAUZE/BANDAGES/DRESSINGS) ×1
APPLIER CLIP 9.375 MED OPEN (MISCELLANEOUS) ×2
APR CLP MED 9.3 20 MLT OPN (MISCELLANEOUS) ×1
BENZOIN TINCTURE PRP APPL 2/3 (GAUZE/BANDAGES/DRESSINGS) ×2 IMPLANT
BINDER BREAST LRG (GAUZE/BANDAGES/DRESSINGS) IMPLANT
BINDER BREAST XLRG (GAUZE/BANDAGES/DRESSINGS) IMPLANT
BLADE SURG ROTATE 9660 (MISCELLANEOUS) IMPLANT
CHLORAPREP W/TINT 26ML (MISCELLANEOUS) ×2 IMPLANT
CLIP APPLIE 9.375 MED OPEN (MISCELLANEOUS) IMPLANT
CLIP TI WIDE RED SMALL 24 (CLIP) IMPLANT
CONT SPEC 4OZ CLIKSEAL STRL BL (MISCELLANEOUS) ×1 IMPLANT
COVER SURGICAL LIGHT HANDLE (MISCELLANEOUS) ×2 IMPLANT
DECANTER SPIKE VIAL GLASS SM (MISCELLANEOUS) IMPLANT
DEVICE DUBIN SPECIMEN MAMMOGRA (MISCELLANEOUS) ×1 IMPLANT
DRAPE LAPAROTOMY TRNSV 102X78 (DRAPE) ×2 IMPLANT
DRAPE UTILITY XL STRL (DRAPES) ×3 IMPLANT
ELECT REM PT RETURN 9FT ADLT (ELECTROSURGICAL) ×2
ELECTRODE REM PT RTRN 9FT ADLT (ELECTROSURGICAL) ×1 IMPLANT
GAUZE SPONGE 4X4 12PLY STRL (GAUZE/BANDAGES/DRESSINGS) ×2 IMPLANT
GAUZE SPONGE 4X4 16PLY XRAY LF (GAUZE/BANDAGES/DRESSINGS) ×2 IMPLANT
GLOVE BIO SURGEON STRL SZ7 (GLOVE) ×2 IMPLANT
GLOVE BIOGEL PI IND STRL 6.5 (GLOVE) IMPLANT
GLOVE BIOGEL PI IND STRL 7.0 (GLOVE) IMPLANT
GLOVE BIOGEL PI IND STRL 7.5 (GLOVE) ×1 IMPLANT
GLOVE BIOGEL PI INDICATOR 6.5 (GLOVE) ×1
GLOVE BIOGEL PI INDICATOR 7.0 (GLOVE) ×1
GLOVE BIOGEL PI INDICATOR 7.5 (GLOVE) ×1
GLOVE SURG SS PI 7.0 STRL IVOR (GLOVE) ×1 IMPLANT
GOWN STRL REUS W/ TWL LRG LVL3 (GOWN DISPOSABLE) ×2 IMPLANT
GOWN STRL REUS W/TWL LRG LVL3 (GOWN DISPOSABLE) ×4
KIT BASIN OR (CUSTOM PROCEDURE TRAY) ×2 IMPLANT
KIT MARKER MARGIN INK (KITS) ×1 IMPLANT
KIT ROOM TURNOVER OR (KITS) ×2 IMPLANT
LIQUID BAND (GAUZE/BANDAGES/DRESSINGS) ×1 IMPLANT
NDL HYPO 25GX1X1/2 BEV (NEEDLE) ×1 IMPLANT
NEEDLE HYPO 25GX1X1/2 BEV (NEEDLE) ×2 IMPLANT
NS IRRIG 1000ML POUR BTL (IV SOLUTION) ×2 IMPLANT
PACK GENERAL/GYN (CUSTOM PROCEDURE TRAY) ×2 IMPLANT
PAD ARMBOARD 7.5X6 YLW CONV (MISCELLANEOUS) ×4 IMPLANT
SPECIMEN JAR MEDIUM (MISCELLANEOUS) ×1 IMPLANT
STAPLER VISISTAT 35W (STAPLE) ×1 IMPLANT
STRIP CLOSURE SKIN 1/2X4 (GAUZE/BANDAGES/DRESSINGS) ×2 IMPLANT
SUT MNCRL AB 4-0 PS2 18 (SUTURE) ×2 IMPLANT
SUT VIC AB 3-0 SH 27 (SUTURE) ×2
SUT VIC AB 3-0 SH 27X BRD (SUTURE) ×1 IMPLANT
SYR CONTROL 10ML LL (SYRINGE) ×2 IMPLANT
TOWEL OR 17X24 6PK STRL BLUE (TOWEL DISPOSABLE) ×2 IMPLANT
TOWEL OR 17X26 10 PK STRL BLUE (TOWEL DISPOSABLE) ×2 IMPLANT
WATER STERILE IRR 1000ML POUR (IV SOLUTION) IMPLANT

## 2014-10-06 NOTE — Transfer of Care (Signed)
Immediate Anesthesia Transfer of Care Note  Patient: Elaine [name]  Procedure(s) Performed: Procedure(s): RIGHT BREAST LUMPECTOMY WITH NEEDLE LOCALIZATION (Right)  Patient Location: PACU  Anesthesia Type:General  Level of Consciousness: awake, alert  and oriented  Airway & Oxygen Therapy: Patient Spontanous Breathing and Patient connected to nasal cannula oxygen  Post-op Assessment: Report given to RN and Post -op Vital signs reviewed and stable  Post vital signs: Reviewed and stable  Last Vitals:  Filed Vitals:   10/06/14 0913  BP: 107/54  Pulse: 70  Temp: 36.1 C  Resp: 18    Complications: No apparent anesthesia complications

## 2014-10-06 NOTE — Op Note (Signed)
Pre-op Diagnosis:  Atypical lobular hyperplasia - right breast Post-op Diagnosis:  Same Procedure:  Right needle-localized lumpectomy Surgeon:  Emmi Wertheim K. Anesthesia:  GEN-LMA Indications: This is a 74 year old female with multiple medical and cardiac issues who presents with an abnormal screening mammogram. Biopsy showed atypical lobular hyperplasia as well as fibroadenoma with microcalcifications. She presents now for excision. She had a wire placed earlier today by radiology.  Description of procedure: Patient brought to the operating room placed in the supine position on the operating room table. After an adequate level of general anesthesia was obtained, her right breast was prepped with ChloraPrep and draped sterile fashion. A timeout was taken to ensure the proper patient and proper procedure.  The area around the wire was infiltrated with 0.25% Marcaine with epinephrine. A transverse incision was made to include the wire. We raised skin flaps all around the incision. I took a core of breast tissue following the wire down for a depth of about 6 cm. The specimen was removed and oriented with a paint kit. A specimen mammogram showed that the biopsy clip was within the specimen. The wire does show a band just after goes through the skin but after discussion with the radiologist does appear that we have the area in question within the specimen. There is plenty of excess breast tissue excised posterior to the clip.  We irrigated the wound thoroughly and inspected for hemostasis.  The wound was closed with 3-0 Vicryl and 4-0 Monocryl. She has a tape allergy so we used skin adhesive on the skin incision. This was allowed to dry. The patient was then extubated and brought to recovery room in stable condition. All sponge, instrument, needle counts are correct.  Imogene Burn. Georgette Dover, MD, [facility]  General/ Trauma Surgery  10/06/2014 11:17 AM

## 2014-10-06 NOTE — Anesthesia Preprocedure Evaluation (Addendum)
Anesthesia Evaluation  Patient identified by MRN, date of birth, ID band Patient awake    Reviewed: Allergy & Precautions, H&P , NPO status , Patient's Chart, lab work & pertinent test results, reviewed documented beta blocker date and time   Airway Mallampati: II  TM Distance: >3 FB Neck ROM: Full    Dental no notable dental hx. (+) Edentulous Upper, Partial Lower, Dental Advisory Given   Pulmonary COPD COPD inhaler, former smoker,  breath sounds clear to auscultation  Pulmonary exam normal       Cardiovascular hypertension, Pt. on medications and Pt. on home beta blockers + CAD, + Past MI, + CABG and + DOE + dysrhythmias Atrial Fibrillation Rhythm:Irregular Rate:Normal     Neuro/Psych negative neurological ROS  negative psych ROS   GI/Hepatic negative GI ROS, Neg liver ROS,   Endo/Other  diabetes, Type 2, Oral Hypoglycemic AgentsHypothyroidism Morbid obesity  Renal/GU negative Renal ROS  negative genitourinary   Musculoskeletal  (+) Arthritis -, Osteoarthritis,    Abdominal   Peds  Hematology negative hematology ROS (+)   Anesthesia Other Findings   Reproductive/Obstetrics negative OB ROS                            Anesthesia Physical Anesthesia Plan  ASA: III  Anesthesia Plan: General   Post-op Pain Management:    Induction: Intravenous  Airway Management Planned: LMA  Additional Equipment:   Intra-op Plan:   Post-operative Plan: Extubation in OR  Informed Consent: I have reviewed the patients History and Physical, chart, labs and discussed the procedure including the risks, benefits and alternatives for the proposed anesthesia with the patient or authorized representative who has indicated his/her understanding and acceptance.   Dental advisory given  Plan Discussed with: CRNA  Anesthesia Plan Comments:         Anesthesia Quick Evaluation

## 2014-10-06 NOTE — Anesthesia Postprocedure Evaluation (Signed)
  Anesthesia Post-op Note  Patient: Elaine [name]  Procedure(s) Performed: Procedure(s): RIGHT BREAST LUMPECTOMY WITH NEEDLE LOCALIZATION (Right)  Patient Location: PACU  Anesthesia Type:General  Level of Consciousness: awake and alert   Airway and Oxygen Therapy: Patient Spontanous Breathing  Post-op Pain: none  Post-op Assessment: Post-op Vital signs reviewed, Patient's Cardiovascular Status Stable and Respiratory Function Stable  Post-op Vital Signs: Reviewed  Filed Vitals:   10/06/14 1145  BP: 116/70  Pulse: 81  Temp:   Resp: 15    Complications: No apparent anesthesia complications

## 2014-10-06 NOTE — Progress Notes (Signed)
Patient brought up from PACU. Report received from Shirlean Mylar, [place]. Patient stable.

## 2014-10-06 NOTE — H&P (Signed)
History of Present IllnessPatient words: Rt. breast.  The patient is a 74 year old female who presents with a complaint of Breast problems. This is a 74 yo female with multiple medical problems who presents after a recent routine screening mammogram. She had some abnormal findings on her right side. She underwent further workup which included a stereotactic right breast biopsy on 07/16/14. The pathology showed atypical lobular hyperplasia as well as a fibroadenoma with calcifications. She is now referred for surgical evaluation for excision. The patient has multiple comorbidities including type 2 diabetes, ischemic cardiomyopathy status post recent CABG. Her cardiologist is Dr. Ellyn Hack. She is scheduled to have a stress test soon. She also developed acute renal failure which was felt to be secondary to allopurinol. Her kidneys seem to have recovered. She is anticoagulated on Coumadin.   Other Problems Chronic Renal Failure Syndrome Congestive Heart Failure Diabetes Mellitus Heart murmur High blood pressure Hypercholesterolemia Myocardial infarction Thyroid Disease  Past Surgical History Breast Biopsy Right. Cataract Surgery Right. Colon Polyp Removal - Colonoscopy Hip Surgery Bilateral. Hysterectomy (due to cancer) - Partial Oral Surgery  Diagnostic Studies History  Colonoscopy 5-10 years ago Mammogram within last year Pap Smear >5 years ago  Allergies  Allopurinol Sodium *GOUT AGENTS* Tape 1"X5yd *MEDICAL DEVICES AND SUPPLIES*  Medication History Carvedilol (6.25MG  Tablet, Oral) Active. Crestor (20MG  Tablet, Oral) Active. Furosemide (40MG  Tablet, Oral) Active. Furosemide (80MG  Tablet, Oral) Active. Levothyroxine Sodium (112MCG Tablet, Oral) Active. Ipratropium-Albuterol (0.5-2.5 (3)MG/3ML Solution, Inhalation) Active. Lisinopril (2.5MG  Tablet, Oral) Active. Nystatin (100000 UNIT/ML Suspension, Mouth/Throat) Active. Pantoprazole Sodium  (40MG  Tablet DR, Oral) Active. PrednisoLONE Acetate (1% Suspension, Ophthalmic) Active. Symbicort (160-4.5MCG/ACT Aerosol, Inhalation) Active. Uloric (40MG  Tablet, Oral) Active. Warfarin Sodium (2.5MG  Tablet, Oral) Active.  Social History  Alcohol use Remotely quit alcohol use. Caffeine use Tea. Illicit drug use Remotely quit drug use. Tobacco use Former smoker.  Family History  Family history unknown First Degree Relatives  Pregnancy / Birth History  Age at menarche 20 years. Age of menopause 60-50 Contraceptive History Oral contraceptives. Gravida 5 Maternal age 37-20 Para 4  Review of Systems  General Not Present- Appetite Loss, Chills, Fatigue, Fever, Night Sweats, Weight Gain and Weight Loss. Skin Present- Dryness. Not Present- Change in Wart/Mole, Hives, Jaundice, New Lesions, Non-Healing Wounds, Rash and Ulcer. HEENT Present- Ringing in the Ears, Visual Disturbances and Wears glasses/contact lenses. Not Present- Earache, Hearing Loss, Hoarseness, Nose Bleed, Oral Ulcers, Seasonal Allergies, Sinus Pain, Sore Throat and Yellow Eyes. Respiratory Present- Difficulty Breathing and Wheezing. Not Present- Bloody sputum, Chronic Cough and Snoring. Breast Not Present- Breast Mass, Breast Pain, Nipple Discharge and Skin Changes. Cardiovascular Present- Shortness of Breath and Swelling of Extremities. Not Present- Chest Pain, Difficulty Breathing Lying Down, Leg Cramps, Palpitations and Rapid Heart Rate. Gastrointestinal Present- Constipation. Not Present- Abdominal Pain, Bloating, Bloody Stool, Change in Bowel Habits, Chronic diarrhea, Difficulty Swallowing, Excessive gas, Gets full quickly at meals, Hemorrhoids, Indigestion, Nausea, Rectal Pain and Vomiting. Female Genitourinary Present- Frequency, Nocturia and Urgency. Not Present- Painful Urination and Pelvic Pain. Musculoskeletal Present- Back Pain and Muscle Weakness. Not Present- Joint Pain, Joint Stiffness, Muscle  Pain and Swelling of Extremities. Neurological Present- Trouble walking. Not Present- Decreased Memory, Fainting, Headaches, Numbness, Seizures, Tingling, Tremor and Weakness. Psychiatric Not Present- Anxiety, Bipolar, Change in Sleep Pattern, Depression, Fearful and Frequent crying. Endocrine Present- Hair Changes and Heat Intolerance. Not Present- Cold Intolerance, Excessive Hunger, Hot flashes and New Diabetes. Hematology Not Present- Easy Bruising, Excessive bleeding, Gland problems, HIV and Persistent Infections.  Vitals  07/27/2014 10:03 AM Weight: 259 lb Height: 61in Body Surface Area: 2.25 m Body Mass Index: 48.94 kg/m Temp.: 98.59F  Pulse: 77 (Regular)  BP: 140/60 (Sitting, Left Arm, Standard)    Physical Exam  The physical exam findings are as follows: Note:WDWN in NAD HEENT: EOMI, sclera anicteric Neck: No masses, no thyromegaly Lungs: CTA bilaterally; normal respiratory effort Breasts: pendulous, symmetric; no palpable masses on either side; no nipple retraction or discharge No axillary lymphadenopathy CV: Regular rate and rhythm; no murmurs Abd: +bowel sounds, soft, non-tender, no masses Ext: Well-perfused; no edema Skin: Warm, dry; no sign of jaundice    Assessment & Plan  ATYPICAL LOBULAR HYPERPLASIA OF RIGHT BREAST (610.8  N60.91) Current Plans  Plan right needle-localized lumpectomy. The surgical procedure has been discussed with the patient. Potential risks, benefits, alternative treatments, and expected outcomes have been explained. All of the patient's questions at this time have been answered. The likelihood of reaching the patient's treatment goal is good. The patient understand the proposed surgical procedure and wishes to proceed.  Since the patient needs to be done at [facility] because of her cardiac comorbidities, we will have to do a needle-localized lumpectomy. Seed localized lumpectomy is not yet available at [facility], only [facility].  Imogene Burn.  Georgette Dover, MD, [facility]  General/ Trauma Surgery  10/06/2014 7:38 AM

## 2014-10-07 ENCOUNTER — Encounter (HOSPITAL_COMMUNITY): Payer: Self-pay | Admitting: Surgery

## 2014-10-07 DIAGNOSIS — N6091 Unspecified benign mammary dysplasia of right breast: Secondary | ICD-10-CM | POA: Diagnosis not present

## 2014-10-07 LAB — GLUCOSE, CAPILLARY
GLUCOSE-CAPILLARY: 164 mg/dL — AB (ref 70–99)
Glucose-Capillary: 133 mg/dL — ABNORMAL HIGH (ref 70–99)

## 2014-10-07 MED ORDER — OXYCODONE-ACETAMINOPHEN 5-325 MG PO TABS: 1.0000 | ORAL_TABLET | ORAL | Status: DC | PRN

## 2014-10-07 NOTE — Progress Notes (Signed)
Discussed discharge summary with patient. Reviewed all medications with patient. Patient received Rx. Patient ready for discharge. 

## 2014-10-07 NOTE — Discharge Instructions (Signed)
[place] Office Phone Number [phone]  BREAST BIOPSY/ PARTIAL MASTECTOMY: POST OP INSTRUCTIONS  Always review your discharge instruction sheet given to you by the facility where your surgery was performed.  IF YOU HAVE DISABILITY OR FAMILY LEAVE FORMS, YOU MUST BRING THEM TO THE OFFICE FOR PROCESSING.  DO NOT GIVE THEM TO YOUR DOCTOR.  1. A prescription for pain medication may be given to you upon discharge.  Take your pain medication as prescribed, if needed.  If narcotic pain medicine is not needed, then you may take acetaminophen (Tylenol) or ibuprofen (Advil) as needed. 2. Take your usually prescribed medications unless otherwise directed 3. If you need a refill on your pain medication, please contact your pharmacy.  They will contact our office to request authorization.  Prescriptions will not be filled after 5pm or on week-ends. 4. You should eat very light the first 24 hours after surgery, such as soup, crackers, pudding, etc.  Resume your normal diet the day after surgery. 5. Most patients will experience some swelling and bruising in the breast.  Ice packs and a good support bra will help.  Swelling and bruising can take several days to resolve.  6. It is common to experience some constipation if taking pain medication after surgery.  Increasing fluid intake and taking a stool softener will usually help or prevent this problem from occurring.  A mild laxative (Milk of Magnesia or Miralax) should be taken according to package directions if there are no bowel movements after 48 hours. 7. Unless discharge instructions indicate otherwise, you may remove your bandages 48 hours after surgery, and you may shower at that time.  You will have steri-strips (small skin tapes) in place directly over the incision.  These strips should be left on the skin for 7-10 days.   Any sutures or staples will be removed at the office during your follow-up visit. 8. ACTIVITIES:  You may resume  regular daily activities (gradually increasing) beginning the next day.  Wearing a good support bra or sports bra minimizes pain and swelling.  You may have sexual intercourse when it is comfortable. a. You may drive when you no longer are taking prescription pain medication, you can comfortably wear a seatbelt, and you can safely maneuver your car and apply brakes. b. RETURN TO WORK:  1-2 weeks 9. You should see your doctor in the office for a follow-up appointment approximately two weeks after your surgery.  Your doctors nurse will typically make your follow-up appointment when she calls you with your pathology report.  Expect your pathology report 2-3 business days after your surgery.  You may call to check if you do not hear from [place] after three days. 10. OTHER INSTRUCTIONS: _______________________________________________________________________________________________ _____________________________________________________________________________________________________________________________________ _____________________________________________________________________________________________________________________________________ _____________________________________________________________________________________________________________________________________  WHEN TO CALL YOUR DOCTOR: 1. Fever over 101.0 2. Nausea and/or vomiting. 3. Extreme swelling or bruising. 4. Continued bleeding from incision. 5. Increased pain, redness, or drainage from the incision.  The clinic staff is available to answer your questions during regular business hours.  Please dont hesitate to call and ask to speak to one of the nurses for clinical concerns.  If you have a medical emergency, go to the nearest emergency room or call 911.  A surgeon from [facility] is always on call at the hospital.  For further questions, please visit [email or web address]

## 2014-10-07 NOTE — Discharge Summary (Signed)
Physician Discharge Summary  Patient ID: Elaine [name] MRN: [id number] DOB/AGE: Oct 05, 1940 74 y.o.  Admit date: 10/06/2014 Discharge date: 10/07/2014  Admission Diagnoses:  Atypical lobular hyperplasia - right breast   Discharge Diagnoses: same Active Problems:   Mass of right breast on mammogram   Discharged Condition: good  Hospital Course: Right needle-localized lumpectomy 10/07/14.  Kept overnight for cardiac monitoring.  No events.  Doing well  Consults: None  Significant Diagnostic Studies: none  Treatments: surgery: as above  Discharge Exam: Blood pressure 119/69, pulse 82, temperature 98.4 F (36.9 C), temperature source Oral, resp. rate 16, height 5\' 2"  (1.575 m), weight 249 lb 12.8 oz (113.309 kg), SpO2 98 %. General appearance: alert, cooperative and no distress Breasts: normal appearance, no masses or tenderness, right lower breast incision clean and dry; dermabond in place  Disposition: 01-Home or Self Care  Discharge Instructions    Call MD for:  persistant nausea and vomiting    Complete by:  As directed      Call MD for:  redness, tenderness, or signs of infection (pain, swelling, redness, odor or green/yellow discharge around incision site)    Complete by:  As directed      Call MD for:  severe uncontrolled pain    Complete by:  As directed      Call MD for:  temperature >100.4    Complete by:  As directed      Diet general    Complete by:  As directed      Driving Restrictions    Complete by:  As directed   Do not drive while taking pain medications     Increase activity slowly    Complete by:  As directed      May shower / Bathe    Complete by:  As directed      May walk up steps    Complete by:  As directed             Medication List    STOP taking these medications        oxycodone 5 MG capsule  Commonly known as:  OXY-IR      TAKE these medications        aspirin EC 81 MG tablet  Take 1 tablet (81 mg total) by mouth daily.     carvedilol 6.25 MG tablet  Commonly known as:  COREG  Take 1 tablet (6.25 mg total) by mouth 2 (two) times daily with a meal.     ciprofloxacin 250 MG tablet  Commonly known as:  CIPRO  Take 250 mg by mouth 2 (two) times daily. 7 day supply     febuxostat 40 MG tablet  Commonly known as:  ULORIC  Take 40 mg by mouth daily.     ferrous sulfate 325 (65 FE) MG tablet  Take 1 tablet (325 mg total) by mouth daily with breakfast.     furosemide 80 MG tablet  Commonly known as:  LASIX  Take 80 mg by mouth 2 (two) times daily.     ipratropium-albuterol 0.5-2.5 (3) MG/3ML Soln  Commonly known as:  DUONEB  Take 3 mLs by nebulization every 4 (four) hours as needed (for shortness of breath).     levothyroxine 112 MCG tablet  Commonly known as:  SYNTHROID  Take 1 tablet (112 mcg total) by mouth daily before breakfast.     lisinopril 2.5 MG tablet  Commonly known as:  PRINIVIL,ZESTRIL  Take 2.5 mg by mouth  daily.     oxyCODONE-acetaminophen 5-325 MG per tablet  Commonly known as:  PERCOCET/ROXICET  Take 1-2 tablets by mouth every 4 (four) hours as needed for moderate pain.     prednisoLONE acetate 1 % ophthalmic suspension  Commonly known as:  PRED FORTE  Place 1 drop into the right eye daily.     rosuvastatin 20 MG tablet  Commonly known as:  CRESTOR  Take 10 mg by mouth at bedtime.     SYMBICORT 160-4.5 MCG/ACT inhaler  Generic drug:  budesonide-formoterol  Inhale 2 puffs into the lungs 2 (two) times daily as needed (for shortness of breath or wheezing).     traMADol 50 MG tablet  Commonly known as:  ULTRAM  Take 1 tablet by mouth 2 (two) times daily as needed.     TYLENOL ARTHRITIS PAIN 650 MG CR tablet  Generic drug:  acetaminophen  Take 1,300 mg by mouth daily as needed for pain.     warfarin 2.5 MG tablet  Commonly known as:  COUMADIN  Take 1.5 to 2 tablets by mouth daily as directed by coumadin clinic           Follow-up Information    Follow up with  Maia Petties., MD. Schedule an appointment as soon as possible for a visit in 2 weeks.   Specialty:  General Surgery   Contact information:   [address] [place] [address] [phone]       Signed: Teandre Hamre K. 10/07/2014, 8:25 AM

## 2014-10-21 ENCOUNTER — Ambulatory Visit: Payer: Medicare HMO | Admitting: Pharmacist Clinician (PhC)/ Clinical Pharmacy Specialist

## 2014-10-26 ENCOUNTER — Ambulatory Visit: Payer: Medicare HMO | Admitting: Pharmacist Clinician (PhC)/ Clinical Pharmacy Specialist

## 2014-10-28 ENCOUNTER — Ambulatory Visit (INDEPENDENT_AMBULATORY_CARE_PROVIDER_SITE_OTHER): Payer: Medicare HMO | Admitting: Pharmacist Clinician (PhC)/ Clinical Pharmacy Specialist

## 2014-10-28 DIAGNOSIS — Z7901 Long term (current) use of anticoagulants: Secondary | ICD-10-CM

## 2014-10-28 DIAGNOSIS — I4891 Unspecified atrial fibrillation: Secondary | ICD-10-CM

## 2014-10-28 DIAGNOSIS — I480 Paroxysmal atrial fibrillation: Secondary | ICD-10-CM

## 2014-10-28 LAB — POCT INR: INR: 4.5

## 2014-11-09 ENCOUNTER — Other Ambulatory Visit: Payer: Self-pay | Admitting: Pharmacist Clinician (PhC)/ Clinical Pharmacy Specialist

## 2014-11-13 ENCOUNTER — Ambulatory Visit (INDEPENDENT_AMBULATORY_CARE_PROVIDER_SITE_OTHER): Payer: Medicare HMO | Admitting: Pharmacist Clinician (PhC)/ Clinical Pharmacy Specialist

## 2014-11-13 DIAGNOSIS — I4891 Unspecified atrial fibrillation: Secondary | ICD-10-CM

## 2014-11-13 DIAGNOSIS — Z7901 Long term (current) use of anticoagulants: Secondary | ICD-10-CM

## 2014-11-13 DIAGNOSIS — I480 Paroxysmal atrial fibrillation: Secondary | ICD-10-CM

## 2014-11-13 LAB — POCT INR: INR: 1.8

## 2014-12-04 ENCOUNTER — Ambulatory Visit: Payer: Medicare HMO | Admitting: Pharmacist Clinician (PhC)/ Clinical Pharmacy Specialist

## 2014-12-10 ENCOUNTER — Ambulatory Visit: Payer: Medicare HMO | Admitting: Pharmacist Clinician (PhC)/ Clinical Pharmacy Specialist

## 2014-12-11 ENCOUNTER — Ambulatory Visit (INDEPENDENT_AMBULATORY_CARE_PROVIDER_SITE_OTHER): Payer: Medicare HMO | Admitting: Pharmacist Clinician (PhC)/ Clinical Pharmacy Specialist

## 2014-12-11 DIAGNOSIS — I480 Paroxysmal atrial fibrillation: Secondary | ICD-10-CM

## 2014-12-11 DIAGNOSIS — I4891 Unspecified atrial fibrillation: Secondary | ICD-10-CM

## 2014-12-11 DIAGNOSIS — Z7901 Long term (current) use of anticoagulants: Secondary | ICD-10-CM

## 2014-12-11 LAB — POCT INR: INR: 4.4

## 2014-12-25 ENCOUNTER — Ambulatory Visit (INDEPENDENT_AMBULATORY_CARE_PROVIDER_SITE_OTHER): Payer: Medicare HMO | Admitting: Pharmacist Clinician (PhC)/ Clinical Pharmacy Specialist

## 2014-12-25 DIAGNOSIS — I480 Paroxysmal atrial fibrillation: Secondary | ICD-10-CM

## 2014-12-25 DIAGNOSIS — Z7901 Long term (current) use of anticoagulants: Secondary | ICD-10-CM

## 2014-12-25 DIAGNOSIS — I4891 Unspecified atrial fibrillation: Secondary | ICD-10-CM

## 2014-12-25 LAB — POCT INR: INR: 3.4

## 2015-01-08 ENCOUNTER — Ambulatory Visit: Payer: Medicare HMO | Admitting: Pharmacist Clinician (PhC)/ Clinical Pharmacy Specialist

## 2015-02-17 IMAGING — CR DG CHEST 1V PORT
1 series · 1 of 1 positions shown · non-contrast
Comparison: DG CHEST 1V PORT dated 10/22/2013; DG CHEST 1V PORT
dated 10/21/2013; DG CHEST 1V PORT dated 10/20/2013

CLINICAL DATA: Postop

EXAM:
PORTABLE CHEST - 1 VIEW

[AP]
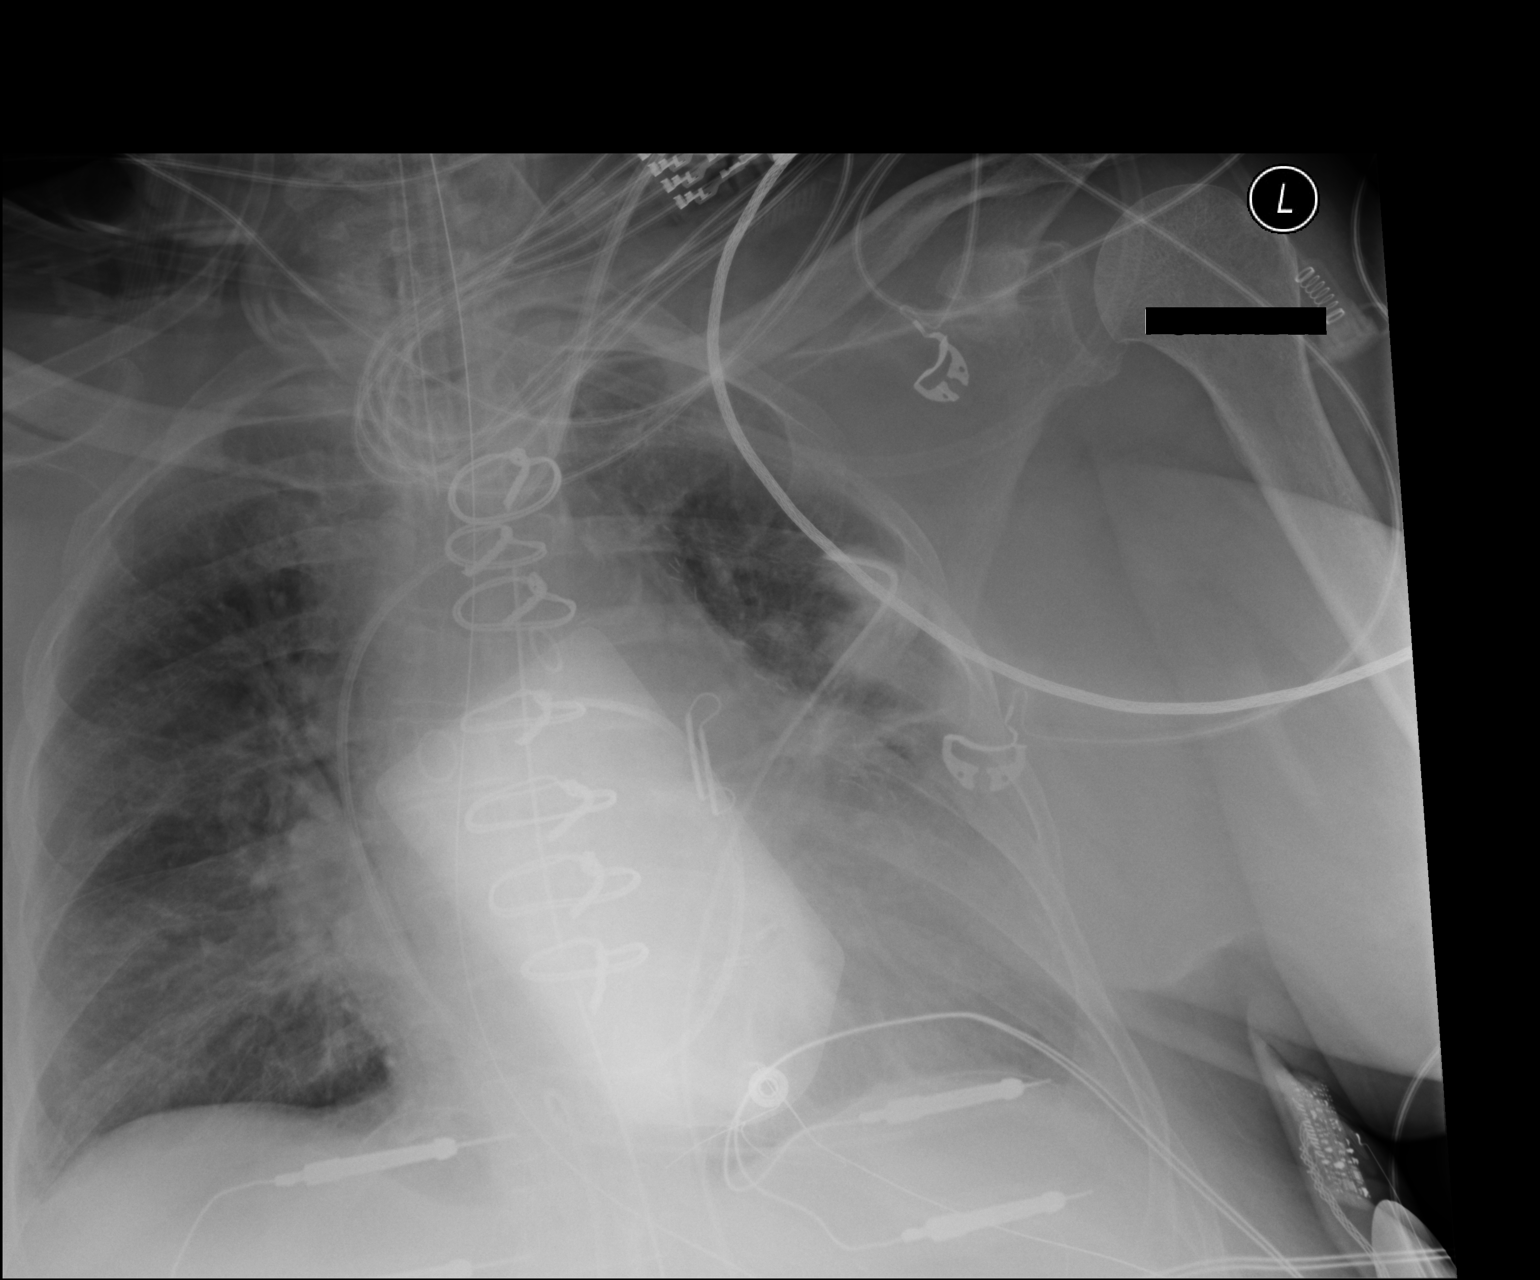

[1 of 1 positions shown; findings below may reference images not displayed]

FINDINGS: Status post CABG. Endotracheal tube tip it is 5 cm above the
carinal. NG tube crosses the gastroesophageal junction. Left-sided
Ed central line identified with tip in the right main
pulmonary artery.

There is a left chest tube.  There is a mediastinal drain.

There is stable mild to moderate cardiac enlargement. There is
moderate vascular congestion. There is peribronchial cuffing and
mild interstitial prominence. No evidence of pneumothorax.
IMPRESSION: Mild postoperative pulmonary edema.

## 2015-02-18 IMAGING — DX DG CHEST 1V PORT
1 series · 1 of 1 positions shown · non-contrast
Comparison: 10/23/2013

CLINICAL DATA: Post CABG.

EXAM:
PORTABLE CHEST - 1 VIEW

[portable]
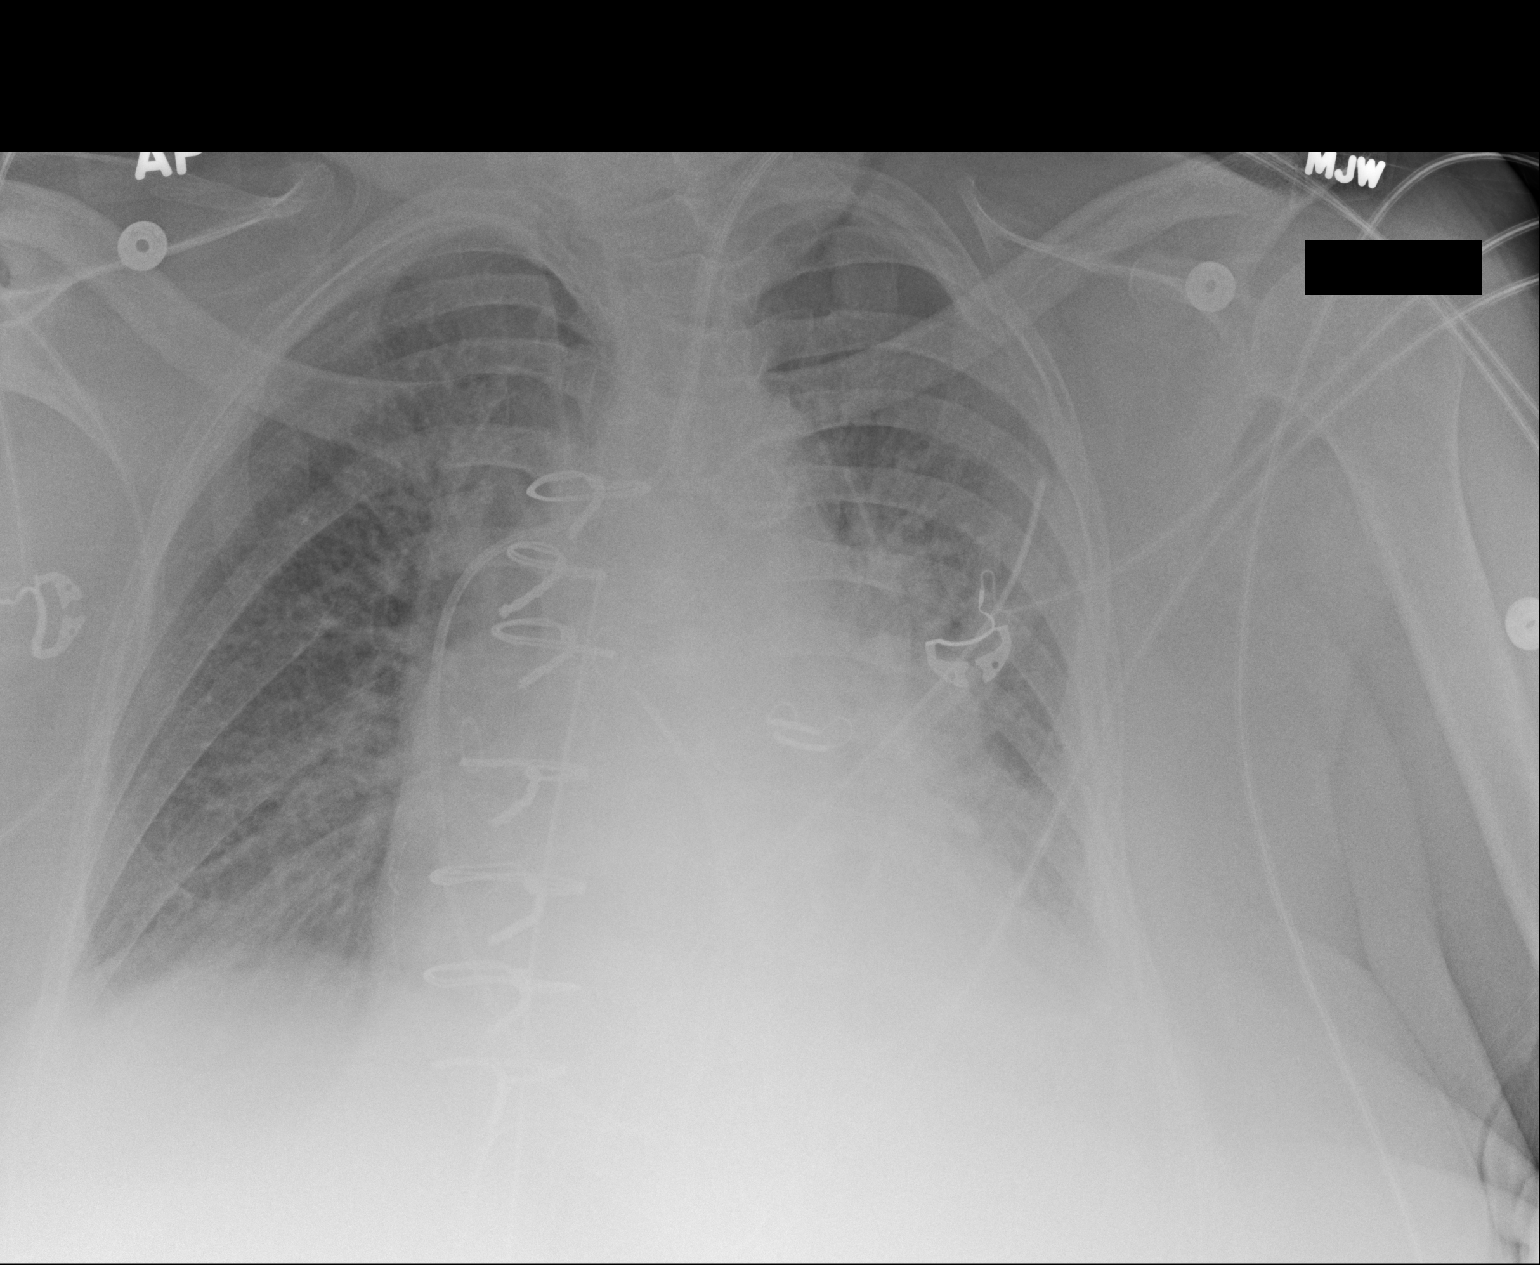

[1 of 1 positions shown; findings below may reference images not displayed]

FINDINGS: The endotracheal tube and nasogastric have been removed. Slightly
enlarged interstitial markings suggest volume loss and cannot
exclude vascular congestion. Again noted is a left chest tube and
mediastinal tube. Swan-Ganz catheter is in the proximal right
pulmonary artery region. Bibasilar densities probably represent
atelectasis. No evidence for a pneumothorax.
IMPRESSION: Removal of nasogastric tube and endotracheal tube. Enlarged
interstitial markings most likely represent atelectasis or mild
vascular congestion.

Negative for a pneumothorax.

## 2015-02-21 IMAGING — CR DG CHEST 1V PORT
1 series · 1 of 1 positions shown · non-contrast
Comparison: DG CHEST 1V PORT dated 10/26/2013; DG CHEST 1V PORT dated
10/25/2013

CLINICAL DATA: Postop day 4, status post CABG.

EXAM:
PORTABLE CHEST - 1 VIEW

[AP]
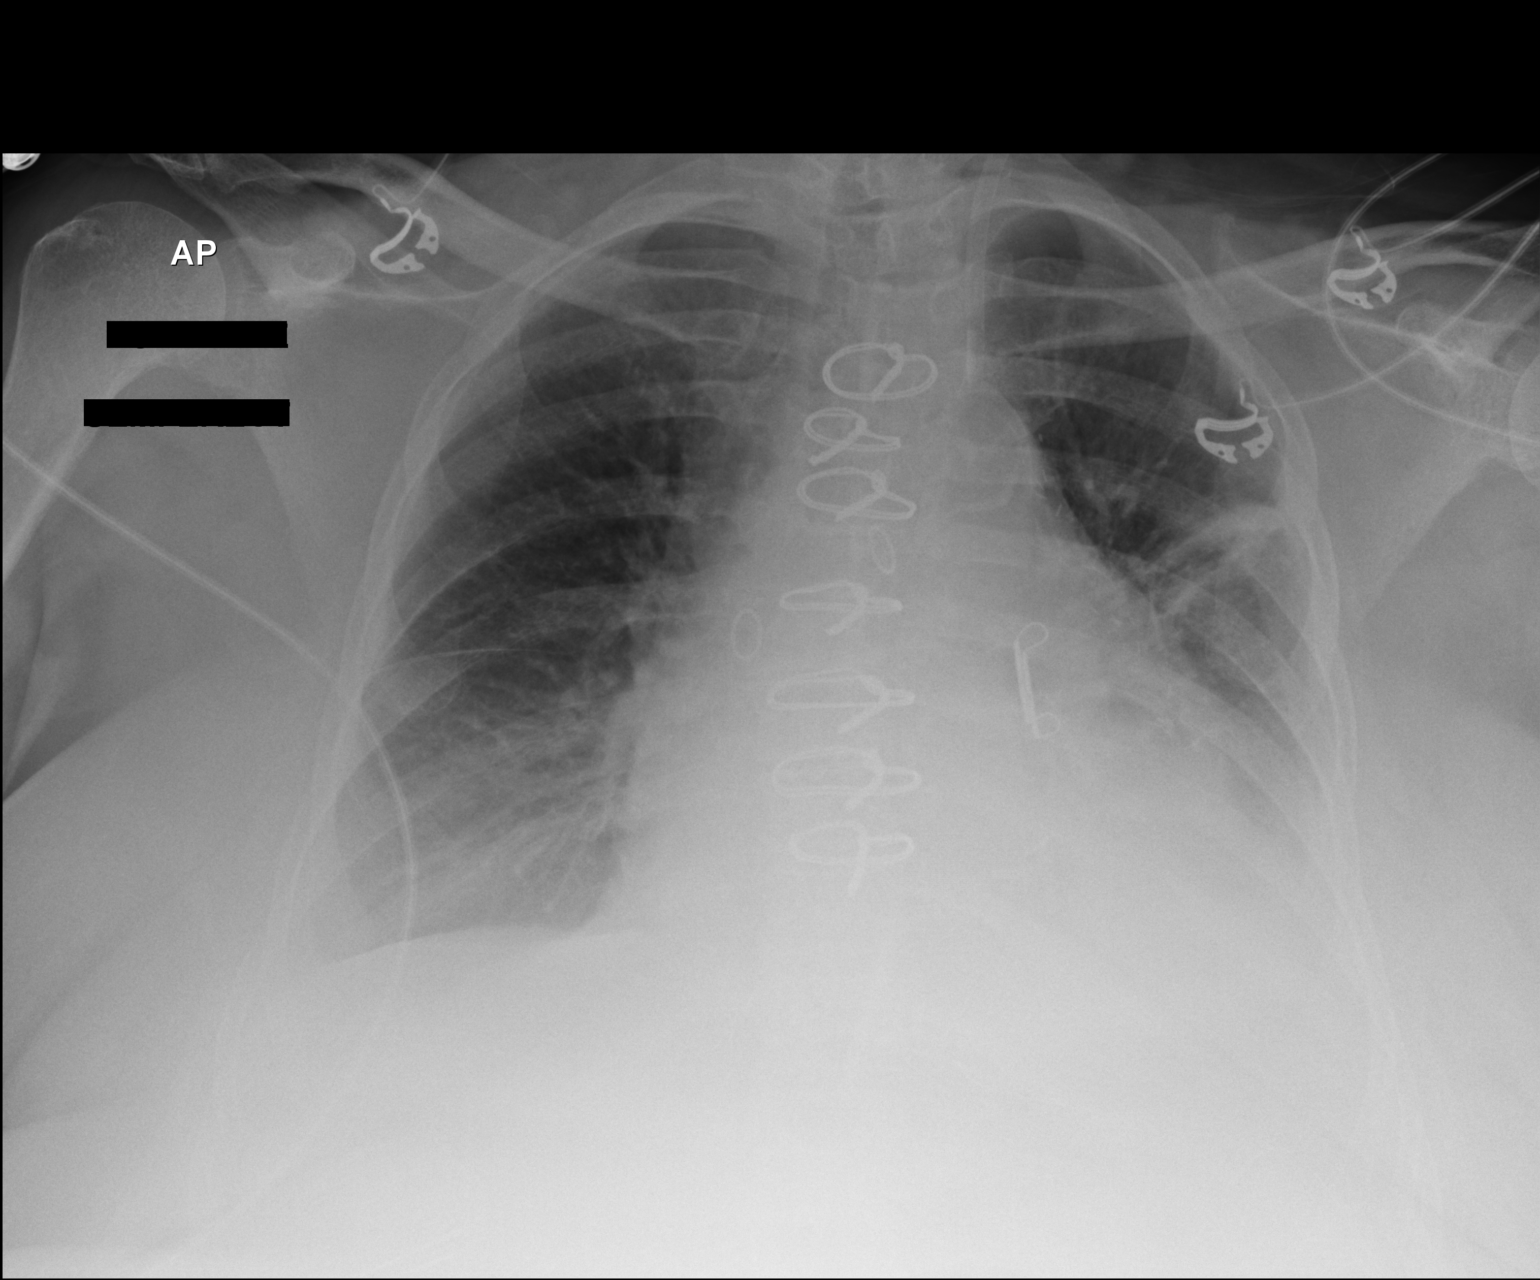

[1 of 1 positions shown; findings below may reference images not displayed]

FINDINGS: Left chest tube and mediastinal drain removed. No pneumothorax
identified.

Left IJ introducer sheath remains in place. Prior CABG noted with
atrial appendage clip in place.

Continued obscuration of the left hemidiaphragm. Hazy density at
both lung bases favoring small pleural effusions. Mild cardiomegaly
without edema. There is a small amount of atelectasis along the left
chest tube tract.
IMPRESSION: 1. Left chest tube and mediastinal drain a been removed. No
pneumothorax or complicating feature.

## 2015-02-25 IMAGING — CR DG CHEST 2V
2 series · 2 of 2 positions shown · non-contrast
Comparison: DG CHEST 1V PORT dated 10/29/2013;

CLINICAL DATA: Status post surgery.

EXAM:
CHEST  2 VIEW

[w chest lat]
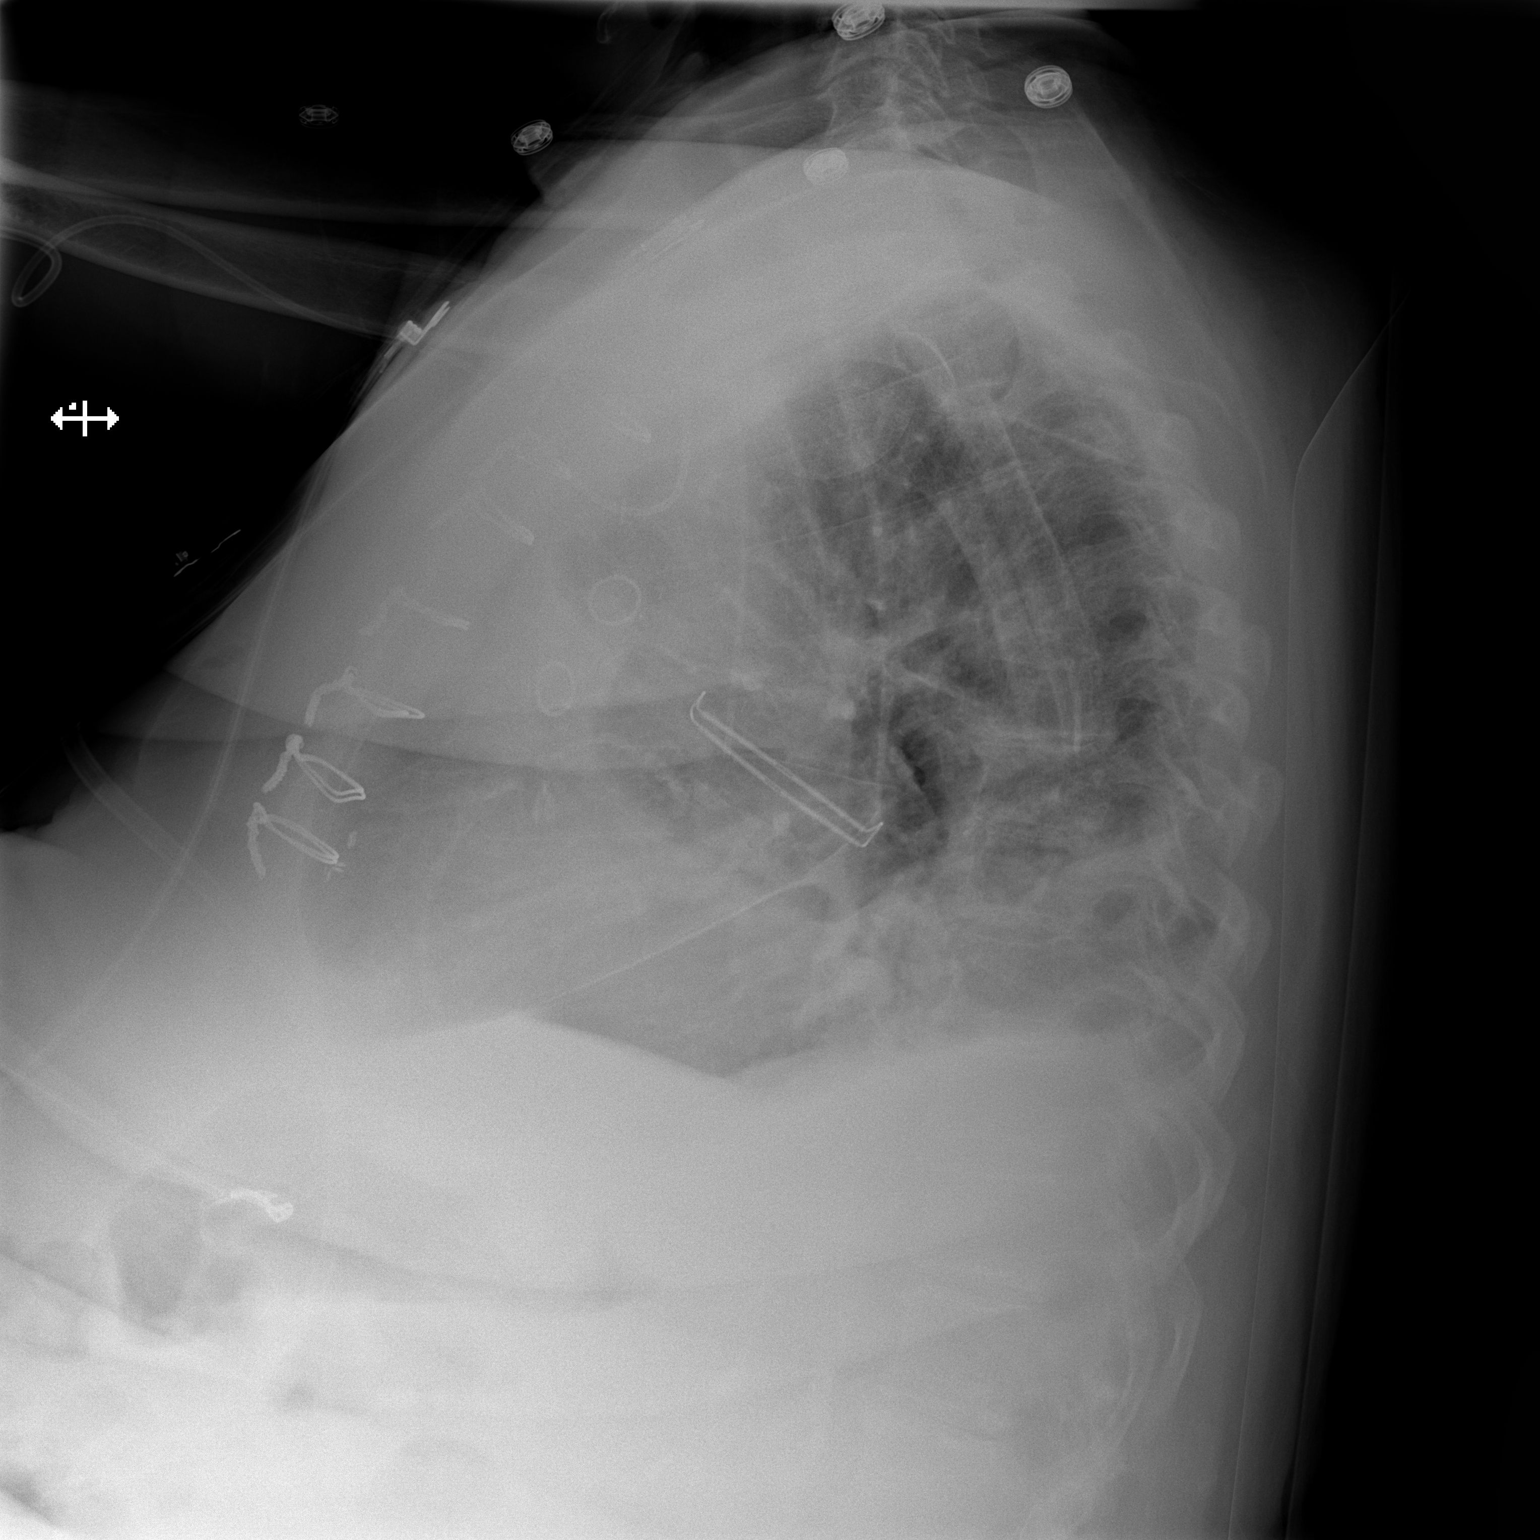

[x chest ap]
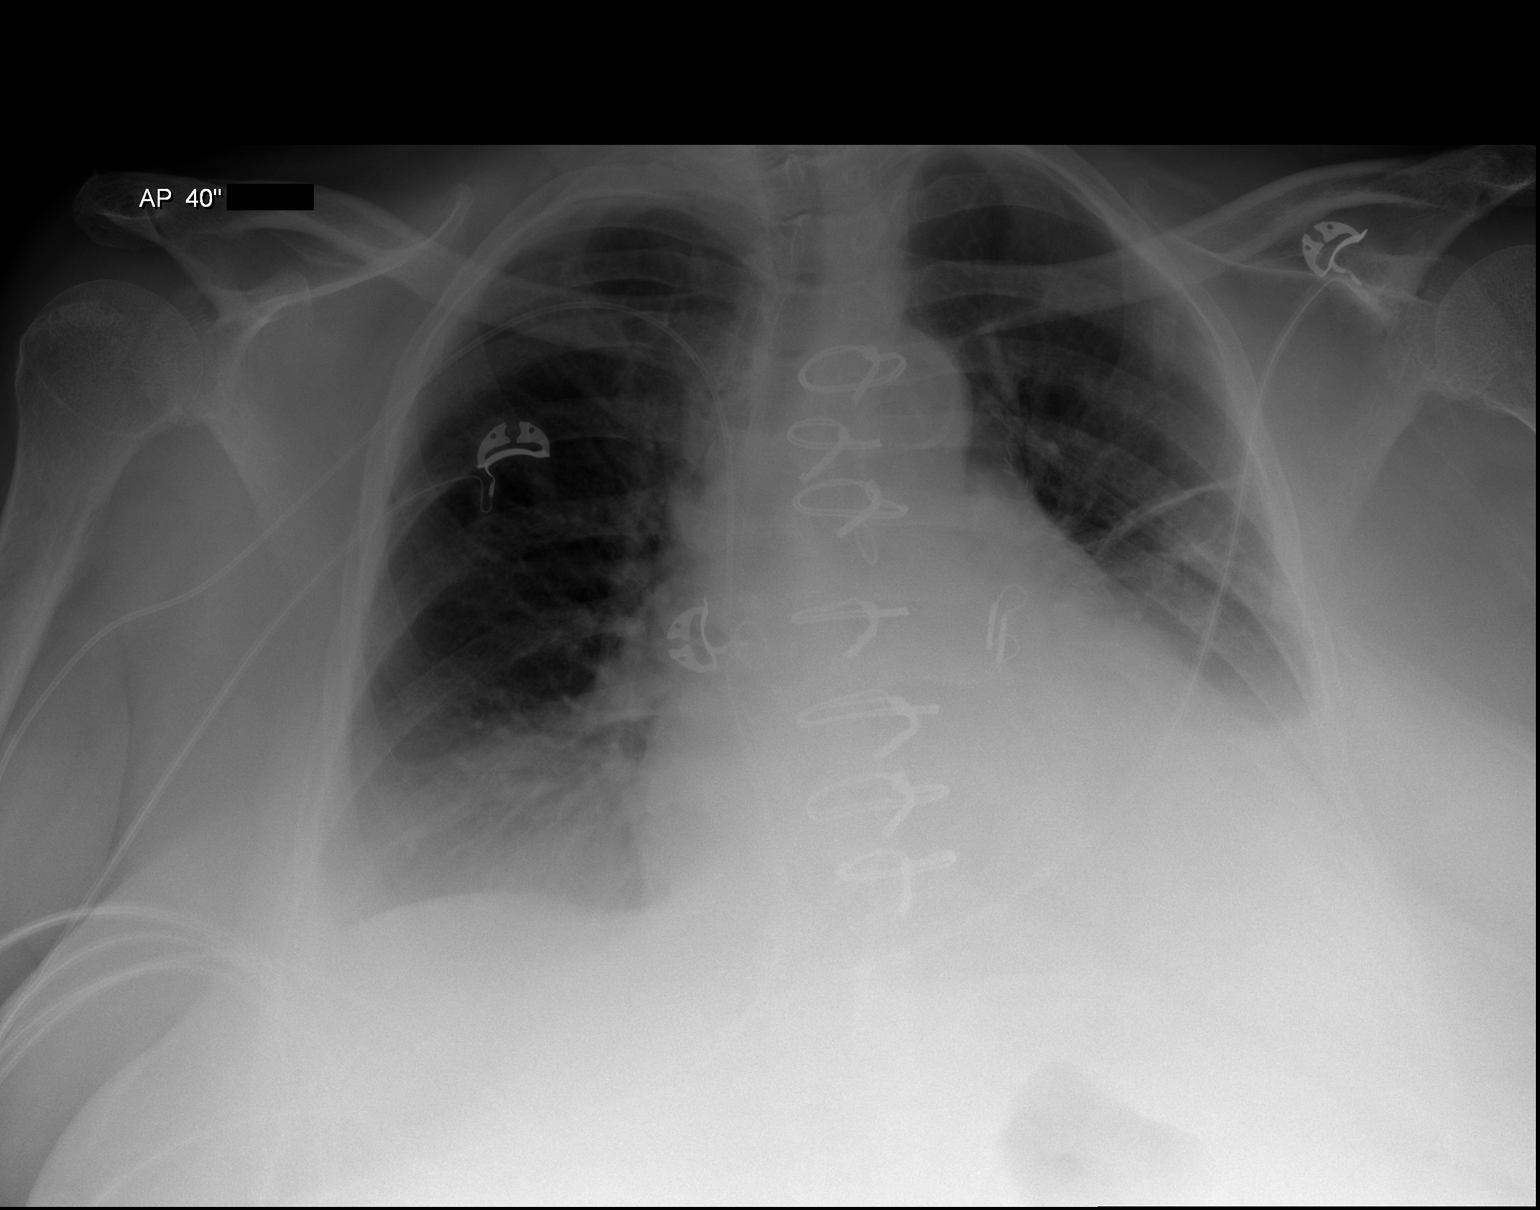

[2 of 2 positions shown; findings below may reference images not displayed]

DG CHEST 2 VIEW dated
10/29/2013; DG CHEST 1V PORT dated 10/27/2013; DG CHEST 1V PORT dated
10/25/2013
FINDINGS: There is a right-sided PICC line with the tip projecting over the
SVC. There has been interval removal of the left jugular sheath.

There is left basilar pleural parenchymal disease unchanged from the
prior exam likely reflecting pleural fluid and atelectasis. There is
a trace right pleural effusion. There is no overt congestive
failure. Stable cardiomediastinal silhouette. Changes from CABG.

The osseous structures are unremarkable.
IMPRESSION: Persistent left basilar pleural parenchymal disease unchanged the
prior exam likely reflecting a combination of pleural fluid and
atelectasis consistent with postoperative changes. Underlying
infection cannot be excluded.

## 2015-03-20 ENCOUNTER — Other Ambulatory Visit: Payer: Self-pay | Admitting: Cardiology

## 2015-03-22 NOTE — Telephone Encounter (Signed)
Rx(s) sent to pharmacy electronically.  

## 2015-04-01 ENCOUNTER — Telehealth: Payer: Self-pay | Admitting: Pharmacist Clinician (PhC)/ Clinical Pharmacy Specialist

## 2015-04-02 ENCOUNTER — Ambulatory Visit: Payer: Self-pay | Admitting: Pharmacist Clinician (PhC)/ Clinical Pharmacy Specialist

## 2015-04-02 DIAGNOSIS — I480 Paroxysmal atrial fibrillation: Secondary | ICD-10-CM

## 2015-04-02 NOTE — Telephone Encounter (Signed)
Close encounter 

## 2015-04-13 ENCOUNTER — Other Ambulatory Visit: Payer: Self-pay | Admitting: Pharmacist Clinician (PhC)/ Clinical Pharmacy Specialist

## 2015-05-10 ENCOUNTER — Encounter: Payer: Self-pay | Admitting: Cardiology

## 2015-05-10 ENCOUNTER — Ambulatory Visit (INDEPENDENT_AMBULATORY_CARE_PROVIDER_SITE_OTHER): Payer: Medicare HMO | Admitting: Cardiology

## 2015-05-10 VITALS — BP 124/62 | HR 66 | Ht 62.0 in | Wt 264.7 lb

## 2015-05-10 DIAGNOSIS — R0600 Dyspnea, unspecified: Secondary | ICD-10-CM

## 2015-05-10 DIAGNOSIS — I2109 ST elevation (STEMI) myocardial infarction involving other coronary artery of anterior wall: Secondary | ICD-10-CM

## 2015-05-10 DIAGNOSIS — E785 Hyperlipidemia, unspecified: Secondary | ICD-10-CM | POA: Diagnosis not present

## 2015-05-10 DIAGNOSIS — I2510 Atherosclerotic heart disease of native coronary artery without angina pectoris: Secondary | ICD-10-CM | POA: Diagnosis not present

## 2015-05-10 DIAGNOSIS — I10 Essential (primary) hypertension: Secondary | ICD-10-CM

## 2015-05-10 DIAGNOSIS — I447 Left bundle-branch block, unspecified: Secondary | ICD-10-CM

## 2015-05-10 DIAGNOSIS — I480 Paroxysmal atrial fibrillation: Secondary | ICD-10-CM

## 2015-05-10 DIAGNOSIS — I255 Ischemic cardiomyopathy: Secondary | ICD-10-CM

## 2015-05-10 DIAGNOSIS — R0609 Other forms of dyspnea: Secondary | ICD-10-CM | POA: Diagnosis not present

## 2015-05-10 DIAGNOSIS — Z951 Presence of aortocoronary bypass graft: Secondary | ICD-10-CM

## 2015-05-10 DIAGNOSIS — E118 Type 2 diabetes mellitus with unspecified complications: Secondary | ICD-10-CM

## 2015-05-10 DIAGNOSIS — R600 Localized edema: Secondary | ICD-10-CM

## 2015-05-10 NOTE — Assessment & Plan Note (Signed)
I suspect that the defect seen on nuclear stress test may be related to her sizable infarct. No recurrent MI-type symptoms. No residual heart failure.

## 2015-05-10 NOTE — Assessment & Plan Note (Addendum)
This is a chronic issue for her. Relatively well controlled with furosemide, but think we may be of reduced the need for furosemide as much by having her wear support hose/compression stockings.she asked about this in the past and I think it's reasonable thing to consider. I provided a prescription for the light weight compression stockings - quite certain that neuropathy she has venous insufficiency associated with it.

## 2015-05-10 NOTE — Patient Instructions (Signed)
PURCHASE SOME SUPPORT STOCKING -- 15-20 MM OF SUPPORT.(LIGHTWEIGHT)   TAKE LASIX EVENING DOSE NO LATER THAN 3 PM  Your physician discussed the importance of regular exercise and recommended that you start or continue a regular exercise program for good health. TRY TO FIND A WATER WALKING OR WATER AEROBICS-TRY SILVER SNEAKERS , [place].  Your physician wants you to follow-up in [place]. You will receive a reminder letter in the mail two months in advance. If you don't receive a letter, please call our office to schedule the follow-up appointment.

## 2015-05-10 NOTE — Assessment & Plan Note (Signed)
She did have what looked like a left bundle branch block while in A. Fib RVR during her MI. However now she has more of a left anterior fascicular block with nonspecific IVCD. I suspect it is a rate-related bundle branch block

## 2015-05-10 NOTE — Assessment & Plan Note (Signed)
Follow closely by PCP. Most recent labs show that her lipids are not adequately controlled. She is on Crestor at 10 mg daily. I'm not sure when her followup testing is scheduled, but if her lipids and still did not look close to goal, wouldn't probably consider increasing Crestor to probably 40 mg daily.

## 2015-05-10 NOTE — Progress Notes (Signed)
PCP: Marton Redwood, MD  Clinic Note: Chief Complaint  Patient presents with  . Follow-up  . Edema    feet and ankles  . Coronary Artery Disease  . Atrial Fibrillation    HPI: Elaine [name] is a 74 y.o. female with a PMH below who presents today for delayed f/u of CAD-CABG & LAA clipping. First diagnosed with MI and February 2015 while in A. Fib with RVR and left bundle branch block.she complained of extreme chest heaviness and pressure concerning for possible stenting. She is not a multivessel CAD with high-grade proximal LAD and moderate left main disease. She was referred for urgent CABGx3 with Left Atrial Appendage clipping. She initially was found to have an ischemic cardiomyopathy with EF of 20-25% with now improvement up to 40 - 45%.she has chronic COPD with exertional dyspnea making it very difficult to ascertain the true nature of her symptoms. She has chronic two-pillow orthopnea with mild intermittent edema but denies PND.  No documented recurrence of Afib RVR.  ALIHA VANVACTOR was last seen in Dec 2015 - pre-op for Lumpectomy (after Myoview. Seen by Dr. Roxy Manns from [place] in Feb 2016.  Recent Hospitalizations: Surgery in Feb 2016 - R Breast Lumpectomy); no complication (24 hr hospitalization)  Studies Reviewed:   Myoview December 2015: Intermediate risk stress nuclear study with large, severe intensity perfusion defect of the anterior wall, apex and inferior apex, which is partially reversible (SDS 8) suggesting scar or possibly hibernating myocardium which is ischemic.  Cardiac Cath Jan 2016:  FALSE + Myoview -- Severe native CAD with occluded LAD after 70-80% Left Main, and diffuse RCA disease; Widely patent grafts to moderately diseased target vessels. Known mild to moderate LV dysfunction, and chronic atrial fibrillation   Interval History: May presents today in great spirits.  She was concerned that she had not been back for her f/u visit, so she called in to schedule.  She  is happy that her lumpectomy went well & that her cath showed good news. For the most part, she is doing well - had her stable exertional dyspnea -- with exercise severely limited by DM Peripheral Neuropathy > DOE.  No change in her baseline DOE.  No SOB @ rest.  No chest pain / presure with rest or exertion. No PND, orthopnea - but chronic LE Edema (well controlled with Lasix).  No palpitations, lightheadedness, dizziness, weakness or syncope/near syncope. No TIA/amaurosis fugax symptoms.   Past Medical History  Diagnosis Date  . Arthritis   . Mild mitral regurgitation by prior echocardiogram     Mild-Mod MR on Echo  . Hyperlipidemia   . Hypertension   . Hypothyroidism   . Ischemic cardiomyopathy - Notable Improvement in EF post CABG 10/20/2013    Echo 2/23: EF 20-25%; mild LVH. anteroseptal Akinesis; mid-apical anterior, inferior & inferoseptal + apical-lateral akinesis; G 1 DDysfxn.; Mild-Mod LA dil; mild MR; Mod PHTN;; F/u Echo 12/2013: EF 40-45%, septal & apical HK, mild LVH; Mod LA dilation  . Acute pulmonary edema with congestive heart failure 10/20/2013    Resolved  . Left bundle branch block (LBBB) on electrocardiogram   . Atrial fibrillation 10/20/2013    On Warfarin  . OSTEOARTHRITIS 08/06/2006  . Morbid obesity   . Diabetic neuropathy   . S/P CABG x 3 10/23/2013    LIMA to LAD, SVG to OM2, SVG to RPLB, EVH via right thigh  . Acute myocardial infarction of anterior wall 10/20/2013    Afib, LBBB  .  CAD, multiple vessel 10/20/2013    LM, LAD & RCA --> CABG; MYOVIEW 12/'15: Intermediate Risk would large anterior, anteroapical segment the apex possible infarct and peri-infarct ischemia  . Heart murmur   . COPD (chronic obstructive pulmonary disease)   . Type II diabetes mellitus with complication [id number]    off rx since heart attack 12/15-dr told could stay off if keep cbg under 150  . Chronic kidney disease   . UTI (lower urinary tract infection) 09/29/14    ongoing now.  started rx 09/28/14    Past Surgical History  Procedure Laterality Date  . Corneal transplant  2011    right eye  . Total hip arthroplasty  2010, 2011    right 2010, left 2011  . Abdominal hysterectomy    . Coronary artery bypass graft N/A 10/23/2013    Procedure: CORONARY ARTERY BYPASS GRAFTING (CABG);  Surgeon: Rexene Alberts, MD;  Location: [facility];  Service: Open Heart Surgery;  Laterality: N/A;  . Intraoperative transesophageal echocardiogram N/A 10/23/2013    Procedure: INTRAOPERATIVE TRANSESOPHAGEAL ECHOCARDIOGRAM;  Surgeon: Rexene Alberts, MD;  Location: [facility];  Service: Open Heart Surgery;  Laterality: N/A;  . Clipping of atrial appendage N/A 10/23/2013    Procedure: CLIPPING OF ATRIAL APPENDAGE;  Surgeon: Rexene Alberts, MD;  Location: [facility];  Service: Open Heart Surgery;  Laterality: N/A;  . Cardiac catheterization  10/20/2013    Distal LM ~70%, LAD - ostial 70%, prox 80, 95 & 95%; RCA prox 70%, mid 80%; minimal Cx disease  . Coronary artery bypass graft  10/23/2013    LIMA-LAD, SVG-OM2, SVG-RPL  . Left heart catheterization with coronary angiogram N/A 10/20/2013    Procedure: LEFT HEART CATHETERIZATION WITH CORONARY ANGIOGRAM;  Surgeon: Leonie Man, MD;  Location: [facility] CATH LAB;  Service: Cardiovascular;  Laterality: N/A;  . Nm myoview ltd  08/14/2014    Lexi scan: INTERMEDIATE RISK - large, severe intensity perfusion defect in anterior wall, apex and inferior apex that is partly reversible. This suggests either scar or possible hibernating myocardium. Nondeviated.  . Left heart catheterization with coronary/graft angiogram N/A 09/08/2014    Procedure: LEFT HEART CATHETERIZATION WITH Beatrix Fetters;  Surgeon: Leonie Man, MD;  Location: [facility] CATH LAB;  Service: Cardiovascular;  Laterality: N/A;  . Breast lumpectomy w/ needle localization Right 10/06/2014    Dr Georgette Dover  . Breast lumpectomy with needle localization Right 10/06/2014    Procedure: RIGHT BREAST LUMPECTOMY WITH  NEEDLE LOCALIZATION;  Surgeon: Donnie Mesa, MD;  Location: [facility];  Service: General;  Laterality: Right;    ROS: A comprehensive was performed. Review of Systems  Constitutional: Negative for malaise/fatigue.  Respiratory: Positive for shortness of breath (baseline dyspnea).   Cardiovascular: Positive for leg swelling. Negative for claudication.  Gastrointestinal: Negative for blood in stool and melena (But does have dark stool b/c iron pills).  Genitourinary: Negative for hematuria.  Musculoskeletal: Positive for joint pain (knees & hips). Negative for myalgias.  Neurological: Negative for dizziness and focal weakness.       Diabetic Neuropathy - bilateral LE from knee down, but especially feet.  Endo/Heme/Allergies: Does not bruise/bleed easily.  Psychiatric/Behavioral: Negative.   All other systems reviewed and are negative.   Prior to Admission medications   Medication Sig Start Date End Date Taking? Authorizing Provider  acetaminophen (TYLENOL ARTHRITIS PAIN) 650 MG CR tablet Take 1,300 mg by mouth daily as needed for pain.    Yes Historical Provider, MD  aspirin EC 81  MG tablet Take 1 tablet (81 mg total) by mouth daily. 11/13/13  Yes Daniel J Angiulli, PA-C  carvedilol (COREG) 6.25 MG tablet Take 1 tablet (6.25 mg total) by mouth 2 (two) times daily with a meal. 04/21/14  Yes Leonie Man, MD  ciprofloxacin (CIPRO) 250 MG tablet Take 250 mg by mouth 2 (two) times daily. 7 day supply   Yes Historical Provider, MD  febuxostat (ULORIC) 40 MG tablet Take 40 mg by mouth daily.   Yes Historical Provider, MD  ferrous sulfate 325 (65 FE) MG tablet TAKE ONE TABLET BY MOUTH ONCE DAILY WITH  BREAKFAST 03/22/15  Yes Leonie Man, MD  furosemide (LASIX) 80 MG tablet Take 80 mg by mouth 2 (two) times daily.  08/06/14  Yes Historical Provider, MD  ipratropium-albuterol (DUONEB) 0.5-2.5 (3) MG/3ML SOLN Take 3 mLs by nebulization every 4 (four) hours as needed (for shortness of breath).   04/13/14  Yes Historical Provider, MD  levothyroxine (SYNTHROID) 112 MCG tablet Take 1 tablet (112 mcg total) by mouth daily before breakfast. 02/05/14  Yes Leanna Battles, MD  lisinopril (PRINIVIL,ZESTRIL) 2.5 MG tablet Take 2.5 mg by mouth daily. 08/06/14  Yes Historical Provider, MD  prednisoLONE acetate (PRED FORTE) 1 % ophthalmic suspension Place 1 drop into the right eye daily.   Yes Historical Provider, MD  PROAIR HFA 108 (90 BASE) MCG/ACT inhaler Inhale 1 puff into the lungs daily. 02/06/15  Yes Historical Provider, MD  rosuvastatin (CRESTOR) 20 MG tablet Take 10 mg by mouth at bedtime.    Yes Historical Provider, MD  SYMBICORT 160-4.5 MCG/ACT inhaler Inhale 2 puffs into the lungs 2 (two) times daily as needed (for shortness of breath or wheezing).  04/14/14  Yes Historical Provider, MD  warfarin (COUMADIN) 2.5 MG tablet TAKE ONE AND ONE-HALF TO TWO TABLETS BY MOUTH DAILY AS DIRECTED BY COUMADIN CLINIC 11/09/14  Yes Leonie Man, MD   Allergies  Allergen Reactions  . Allopurinol Hives  . Tape Rash and Other (See Comments)    Uncoded Allergy. Allergen: Tape Adhesive tape     Social History   Social History  . Marital Status: Married    Spouse Name: N/A  . Number of Children: N/A  . Years of Education: N/A   Occupational History  . retired    Social History Main Topics  . Smoking status: Former Smoker -- 0.50 packs/day for 39 years    Types: Cigarettes    Quit date: 08/28/1993  . Smokeless tobacco: Never Used  . Alcohol Use: No     Comment: quit 95  . Drug Use: No  . Sexual Activity: Not Asked   Other Topics Concern  . None   Social History Narrative   Lives with husband and son in [place].  She is currently retired.   Former smoker quit in 1995.   Family History  Problem Relation Age of Onset  . Colon cancer Neg Hx   . Esophageal cancer Neg Hx   . Rectal cancer Neg Hx   . Stomach cancer Neg Hx      Wt Readings from Last 3 Encounters:  05/10/15 264 lb  11.2 oz (120.067 kg)  10/06/14 249 lb 12.8 oz (113.309 kg)  09/29/14 249 lb 12.8 oz (113.309 kg)    PHYSICAL EXAM BP 124/62 mmHg  Pulse 66  Ht 5\' 2"  (1.575 m)  Wt 264 lb 11.2 oz (120.067 kg)  BMI 48.40 kg/m2 General appearance: alert, cooperative, appears stated age, no distress and  morbidly obese Neck: no adenopathy, no carotid bruit and no JVD Lungs: clear to auscultation bilaterally, normal percussion bilaterally and non-labored Heart: regular rate and rhythm, S1, S2 normal, no murmur, click, rub or gallop; unable to palpate PMI Abdomen: soft, non-tender; bowel sounds normal; no masses,  no organomegaly; no HJR Extremities: extremities normal, atraumatic, no cyanosis, and edema 2+ Pulses: 2+ and symmetric - but difficult to palpate pedal pulses due to edema Skin: mobility and turgor normal Neurologic: Mental status: Alert, oriented, thought content appropriate; very pleasant mood & affect Cranial nerves: normal (II-XII grossly intact)    Adult ECG Report  Rate: 66 ;  Rhythm: normal sinus rhythm and Fist Deg AVB (222), LAFB (-61);   Narrative Interpretation: Otherwise Non-specific IVCD -- change from last EKG - NSR replaced Afib   Other studies Reviewed: Additional studies/ records that were reviewed today include:  Recent Labs:  Checked by PCP (Dr. Brigitte Pulse) - July 2016  Na+ 143, K+ 5.1, Cl- 97, HCO3- 28 , BUN 36, Cr 1.57, Glu 156, Ca2+ 10.0; AST 23 ALT 22; AlkP 105, Alb 4.3  TC 224, TG 160, HDL 65, LDL 135   ASSESSMENT / PLAN: Problem List Items Addressed This Visit    Acute myocardial infarction of anterior wall, subsequent episode of care (Chronic)    I suspect that the defect seen on nuclear stress test may be related to her sizable infarct. No recurrent MI-type symptoms. No residual heart failure.      CAD status post CABG - Primary (Chronic)    No active angina or heart failure symptoms. She had stable exertional dyspnea but otherwise doing well. A recent cardiac  catheterization showed patent grafts suggesting a false positive Myoview stress test.  Plan: Continue current dose of carvedilol and lisinopril. Continue aspirin and statin.      Relevant Orders   EKG 12-Lead   DOE (dyspnea on exertion)    His premature baseline with morbid obesity, deconditioning plus COPD. Clearly there is probably some component of mild systolic and diastolic dysfunction as well. She is on an adequate regimen for her cardiac symptoms.      Edema of both legs    This is a chronic issue for her. Relatively well controlled with furosemide, but think we may be of reduced the need for furosemide as much by having her wear support hose/compression stockings.she asked about this in the past and I think it's reasonable thing to consider. I provided a prescription for the light weight compression stockings - quite certain that neuropathy she has venous insufficiency associated with it.      Relevant Orders   Compression stockings   Essential hypertension (Chronic)    Excellent control blood pressure. Also monitored by her nephrologist.      Relevant Orders   EKG 12-Lead   Hyperlipidemia with target LDL less than 70 (Chronic)    Follow closely by PCP. Most recent labs show that her lipids are not adequately controlled. She is on Crestor at 10 mg daily. I'm not sure when her followup testing is scheduled, but if her lipids and still did not look close to goal, wouldn't probably consider increasing Crestor to probably 40 mg daily.      Relevant Orders   EKG 12-Lead   ICM EF by Echo 20-25% Feb 2015, recently improved to 40-45% by echo 01/13/14 (Chronic)    Improved EF at revascularization and medical management. On beta blocker and ACE inhibitor as well as standing dose of Lasix. She  definitely has diastolic dysfunction as well as mild systolic dysfunction. Recommended changing p.m. Dose of Lasix to earlier in the afternoon to avoid nocturnal diuresis.  Otherwise denies any  PND or orthopnea to suggest volume overload.      Relevant Orders   EKG 12-Lead   Compression stockings   Left bundle branch block (LBBB) on electrocardiogram (Chronic)    She did have what looked like a left bundle branch block while in A. Fib RVR during her MI. However now she has more of a left anterior fascicular block with nonspecific IVCD. I suspect it is a rate-related bundle branch block      Left main coronary artery disease (Chronic)   Morbid obesity- BMI 48 (Chronic)    Becoming quite an issue since she is really has limited mobility with her neuropathy pain. We talked about the importance of dietary modification would reduce caloric intake. But also for her to burn calories needed to grab something of exercise. I recommended that she look into the [employer] so she could for her and see if she can do water aerobics and then consider doing some water walking. This would alleviate concerns with her knee discomfort. Apparently because of her neuropathy, she was unable to do the cardiac rehabilitation maintenance program      Relevant Orders   EKG 12-Lead   Compression stockings   PAF- doing MI and perioperative CABG (Chronic)    Currently in a normal sinus rhythm on beta blocker - was in A. Fib during last EKG.. Anticoagulated with warfarin.I wonder if being in atrial fibrillation was what caused her to have some discomfort symptoms leading to having the Myoview stress test.      S/P CABG x 3- 10/23/13 (Chronic)   Type II diabetes mellitus with complication (Chronic)      Current medicines are reviewed at length with the patient today. (+/- concerns) timing of Lasix dosing (is urinating all night) The following changes have been made: change PM dose of Lasix to ~2-4 pm (not after 6 pm).  Studies Ordered:   Orders Placed This Encounter  Procedures  . Compression stockings  . EKG 12-Lead    PATIENT INSTRUCTIONS PURCHASE SOME SUPPORT STOCKING -- 15-20 MM OF  SUPPORT.(LIGHTWEIGHT)   TAKE LASIX EVENING DOSE NO LATER THAN 3 PM  Your physician discussed the importance of regular exercise and recommended that you start or continue a regular exercise program for good health. TRY TO FIND A WATER WALKING OR WATER AEROBICS-TRY SILVER SNEAKERS , [place].  Your physician wants you to follow-up in [place].    Leonie Man, M.D., M.S. Interventional Cardiologist   Pager # [phone]

## 2015-05-10 NOTE — Assessment & Plan Note (Signed)
Excellent control blood pressure. Also monitored by her nephrologist.

## 2015-05-10 NOTE — Assessment & Plan Note (Addendum)
Becoming quite an issue since she is really has limited mobility with her neuropathy pain. We talked about the importance of dietary modification would reduce caloric intake. But also for her to burn calories needed to grab something of exercise. I recommended that she look into the [employer] so she could for her and see if she can do water aerobics and then consider doing some water walking. This would alleviate concerns with her knee discomfort. Apparently because of her neuropathy, she was unable to do the cardiac rehabilitation maintenance program

## 2015-05-10 NOTE — Assessment & Plan Note (Signed)
Currently in a normal sinus rhythm on beta blocker - was in A. Fib during last EKG.. Anticoagulated with warfarin.I wonder if being in atrial fibrillation was what caused her to have some discomfort symptoms leading to having the Myoview stress test.

## 2015-05-10 NOTE — Assessment & Plan Note (Signed)
His premature baseline with morbid obesity, deconditioning plus COPD. Clearly there is probably some component of mild systolic and diastolic dysfunction as well. She is on an adequate regimen for her cardiac symptoms.

## 2015-05-10 NOTE — Assessment & Plan Note (Signed)
No active angina or heart failure symptoms. She had stable exertional dyspnea but otherwise doing well. A recent cardiac catheterization showed patent grafts suggesting a false positive Myoview stress test.  Plan: Continue current dose of carvedilol and lisinopril. Continue aspirin and statin.

## 2015-05-10 NOTE — Assessment & Plan Note (Signed)
Improved EF at revascularization and medical management. On beta blocker and ACE inhibitor as well as standing dose of Lasix. She definitely has diastolic dysfunction as well as mild systolic dysfunction. Recommended changing p.m. Dose of Lasix to earlier in the afternoon to avoid nocturnal diuresis.  Otherwise denies any PND or orthopnea to suggest volume overload.

## 2015-05-14 ENCOUNTER — Encounter: Payer: Self-pay | Admitting: Cardiology

## 2015-05-21 ENCOUNTER — Other Ambulatory Visit: Payer: Self-pay | Admitting: Cardiology

## 2015-05-21 NOTE — Telephone Encounter (Signed)
REFILL 

## 2015-06-08 DIAGNOSIS — Z7901 Long term (current) use of anticoagulants: Secondary | ICD-10-CM | POA: Diagnosis not present

## 2015-06-08 DIAGNOSIS — I4891 Unspecified atrial fibrillation: Secondary | ICD-10-CM | POA: Diagnosis not present

## 2015-06-22 DIAGNOSIS — M76822 Posterior tibial tendinitis, left leg: Secondary | ICD-10-CM | POA: Diagnosis not present

## 2015-06-29 DIAGNOSIS — I4891 Unspecified atrial fibrillation: Secondary | ICD-10-CM | POA: Diagnosis not present

## 2015-06-29 DIAGNOSIS — Z7901 Long term (current) use of anticoagulants: Secondary | ICD-10-CM | POA: Diagnosis not present

## 2015-07-19 ENCOUNTER — Ambulatory Visit (INDEPENDENT_AMBULATORY_CARE_PROVIDER_SITE_OTHER): Payer: Medicare HMO | Admitting: Ophthalmology

## 2015-07-27 DIAGNOSIS — I4891 Unspecified atrial fibrillation: Secondary | ICD-10-CM | POA: Diagnosis not present

## 2015-07-27 DIAGNOSIS — Z7901 Long term (current) use of anticoagulants: Secondary | ICD-10-CM | POA: Diagnosis not present

## 2015-08-10 DIAGNOSIS — I4891 Unspecified atrial fibrillation: Secondary | ICD-10-CM | POA: Diagnosis not present

## 2015-08-10 DIAGNOSIS — Z7901 Long term (current) use of anticoagulants: Secondary | ICD-10-CM | POA: Diagnosis not present

## 2015-08-24 ENCOUNTER — Other Ambulatory Visit: Payer: Self-pay | Admitting: Cardiology

## 2015-08-31 DIAGNOSIS — Z7901 Long term (current) use of anticoagulants: Secondary | ICD-10-CM | POA: Diagnosis not present

## 2015-08-31 DIAGNOSIS — I4891 Unspecified atrial fibrillation: Secondary | ICD-10-CM | POA: Diagnosis not present

## 2015-09-22 DIAGNOSIS — M76822 Posterior tibial tendinitis, left leg: Secondary | ICD-10-CM | POA: Diagnosis not present

## 2015-09-22 DIAGNOSIS — E119 Type 2 diabetes mellitus without complications: Secondary | ICD-10-CM | POA: Diagnosis not present

## 2015-09-29 DIAGNOSIS — Z7901 Long term (current) use of anticoagulants: Secondary | ICD-10-CM | POA: Diagnosis not present

## 2015-09-29 DIAGNOSIS — I480 Paroxysmal atrial fibrillation: Secondary | ICD-10-CM | POA: Diagnosis not present

## 2015-10-29 DIAGNOSIS — I4891 Unspecified atrial fibrillation: Secondary | ICD-10-CM | POA: Diagnosis not present

## 2015-10-29 DIAGNOSIS — Z7901 Long term (current) use of anticoagulants: Secondary | ICD-10-CM | POA: Diagnosis not present

## 2015-11-09 DIAGNOSIS — Z961 Presence of intraocular lens: Secondary | ICD-10-CM | POA: Diagnosis not present

## 2015-11-09 DIAGNOSIS — H26492 Other secondary cataract, left eye: Secondary | ICD-10-CM | POA: Diagnosis not present

## 2015-11-16 DIAGNOSIS — I4891 Unspecified atrial fibrillation: Secondary | ICD-10-CM | POA: Diagnosis not present

## 2015-11-16 DIAGNOSIS — Z7901 Long term (current) use of anticoagulants: Secondary | ICD-10-CM | POA: Diagnosis not present

## 2015-11-25 DIAGNOSIS — Z0000 Encounter for general adult medical examination without abnormal findings: Secondary | ICD-10-CM | POA: Diagnosis not present

## 2015-11-25 DIAGNOSIS — E1149 Type 2 diabetes mellitus with other diabetic neurological complication: Secondary | ICD-10-CM | POA: Diagnosis not present

## 2015-11-25 DIAGNOSIS — E038 Other specified hypothyroidism: Secondary | ICD-10-CM | POA: Diagnosis not present

## 2015-11-25 DIAGNOSIS — M810 Age-related osteoporosis without current pathological fracture: Secondary | ICD-10-CM | POA: Diagnosis not present

## 2015-11-25 DIAGNOSIS — M109 Gout, unspecified: Secondary | ICD-10-CM | POA: Diagnosis not present

## 2015-11-25 DIAGNOSIS — I10 Essential (primary) hypertension: Secondary | ICD-10-CM | POA: Diagnosis not present

## 2015-11-25 DIAGNOSIS — N390 Urinary tract infection, site not specified: Secondary | ICD-10-CM | POA: Diagnosis not present

## 2015-11-25 DIAGNOSIS — R8290 Unspecified abnormal findings in urine: Secondary | ICD-10-CM | POA: Diagnosis not present

## 2015-11-25 DIAGNOSIS — E784 Other hyperlipidemia: Secondary | ICD-10-CM | POA: Diagnosis not present

## 2015-11-30 DIAGNOSIS — Z1212 Encounter for screening for malignant neoplasm of rectum: Secondary | ICD-10-CM | POA: Diagnosis not present

## 2015-12-02 DIAGNOSIS — I25810 Atherosclerosis of coronary artery bypass graft(s) without angina pectoris: Secondary | ICD-10-CM | POA: Diagnosis not present

## 2015-12-02 DIAGNOSIS — N183 Chronic kidney disease, stage 3 (moderate): Secondary | ICD-10-CM | POA: Diagnosis not present

## 2015-12-02 DIAGNOSIS — E784 Other hyperlipidemia: Secondary | ICD-10-CM | POA: Diagnosis not present

## 2015-12-02 DIAGNOSIS — I5022 Chronic systolic (congestive) heart failure: Secondary | ICD-10-CM | POA: Diagnosis not present

## 2015-12-02 DIAGNOSIS — E038 Other specified hypothyroidism: Secondary | ICD-10-CM | POA: Diagnosis not present

## 2015-12-02 DIAGNOSIS — Z7901 Long term (current) use of anticoagulants: Secondary | ICD-10-CM | POA: Diagnosis not present

## 2015-12-02 DIAGNOSIS — E1129 Type 2 diabetes mellitus with other diabetic kidney complication: Secondary | ICD-10-CM | POA: Diagnosis not present

## 2015-12-02 DIAGNOSIS — I4891 Unspecified atrial fibrillation: Secondary | ICD-10-CM | POA: Diagnosis not present

## 2015-12-02 DIAGNOSIS — I10 Essential (primary) hypertension: Secondary | ICD-10-CM | POA: Diagnosis not present

## 2015-12-02 DIAGNOSIS — E1149 Type 2 diabetes mellitus with other diabetic neurological complication: Secondary | ICD-10-CM | POA: Diagnosis not present

## 2015-12-09 DIAGNOSIS — M2142 Flat foot [pes planus] (acquired), left foot: Secondary | ICD-10-CM | POA: Diagnosis not present

## 2015-12-09 DIAGNOSIS — R2689 Other abnormalities of gait and mobility: Secondary | ICD-10-CM | POA: Diagnosis not present

## 2015-12-09 DIAGNOSIS — M76822 Posterior tibial tendinitis, left leg: Secondary | ICD-10-CM | POA: Diagnosis not present

## 2015-12-22 ENCOUNTER — Encounter: Payer: Self-pay | Admitting: Gastroenterology

## 2016-01-01 ENCOUNTER — Other Ambulatory Visit: Payer: Self-pay | Admitting: Cardiology

## 2016-01-03 NOTE — Telephone Encounter (Signed)
Rx request sent to pharmacy.  

## 2016-01-12 DIAGNOSIS — N183 Chronic kidney disease, stage 3 (moderate): Secondary | ICD-10-CM | POA: Diagnosis not present

## 2016-01-12 DIAGNOSIS — E669 Obesity, unspecified: Secondary | ICD-10-CM | POA: Diagnosis not present

## 2016-01-12 DIAGNOSIS — E8770 Fluid overload, unspecified: Secondary | ICD-10-CM | POA: Diagnosis not present

## 2016-01-12 DIAGNOSIS — I959 Hypotension, unspecified: Secondary | ICD-10-CM | POA: Diagnosis not present

## 2016-01-21 DIAGNOSIS — N183 Chronic kidney disease, stage 3 (moderate): Secondary | ICD-10-CM | POA: Diagnosis not present

## 2016-01-25 ENCOUNTER — Ambulatory Visit: Payer: Medicare HMO | Admitting: Cardiology

## 2016-01-27 ENCOUNTER — Other Ambulatory Visit: Payer: Self-pay | Admitting: Cardiology

## 2016-01-27 NOTE — Telephone Encounter (Signed)
Rx(s) sent to pharmacy electronically.  

## 2016-02-11 ENCOUNTER — Telehealth: Payer: Self-pay | Admitting: Cardiology

## 2016-02-11 MED ORDER — CARVEDILOL 6.25 MG PO TABS: 6.2500 mg | ORAL_TABLET | Freq: Two times a day (BID) | ORAL | Status: DC

## 2016-02-11 NOTE — Telephone Encounter (Signed)
New message   *STAT* If patient is at the pharmacy, call can be transferred to refill team.   1. Which medications need to be refilled? (please list name of each medication and dose if known) carvedilol (COREG) 6.25 MG tablet  2. Which pharmacy/location (including street and city if local pharmacy) is medication to be sent to? [facility] in [facility] ( [place] in [place] )  3. Do they need a 30 day or 90 day supply? 30 day supply

## 2016-02-11 NOTE — Telephone Encounter (Signed)
E-SENT TO PHARMACY  LEFT MESSAGE ON PATIENT VOICE MAIL

## 2016-03-06 DIAGNOSIS — I4891 Unspecified atrial fibrillation: Secondary | ICD-10-CM | POA: Diagnosis not present

## 2016-03-06 DIAGNOSIS — I10 Essential (primary) hypertension: Secondary | ICD-10-CM | POA: Diagnosis not present

## 2016-03-06 DIAGNOSIS — E1149 Type 2 diabetes mellitus with other diabetic neurological complication: Secondary | ICD-10-CM | POA: Diagnosis not present

## 2016-03-06 DIAGNOSIS — I25810 Atherosclerosis of coronary artery bypass graft(s) without angina pectoris: Secondary | ICD-10-CM | POA: Diagnosis not present

## 2016-03-06 DIAGNOSIS — N183 Chronic kidney disease, stage 3 (moderate): Secondary | ICD-10-CM | POA: Diagnosis not present

## 2016-03-06 DIAGNOSIS — I5022 Chronic systolic (congestive) heart failure: Secondary | ICD-10-CM | POA: Diagnosis not present

## 2016-03-06 DIAGNOSIS — E784 Other hyperlipidemia: Secondary | ICD-10-CM | POA: Diagnosis not present

## 2016-03-06 DIAGNOSIS — E1129 Type 2 diabetes mellitus with other diabetic kidney complication: Secondary | ICD-10-CM | POA: Diagnosis not present

## 2016-03-06 DIAGNOSIS — Z7901 Long term (current) use of anticoagulants: Secondary | ICD-10-CM | POA: Diagnosis not present

## 2016-03-06 DIAGNOSIS — J449 Chronic obstructive pulmonary disease, unspecified: Secondary | ICD-10-CM | POA: Diagnosis not present

## 2016-03-27 ENCOUNTER — Other Ambulatory Visit: Payer: Self-pay | Admitting: Cardiology

## 2016-03-27 ENCOUNTER — Other Ambulatory Visit: Payer: Self-pay

## 2016-03-27 MED ORDER — CARVEDILOL 6.25 MG PO TABS: 6.2500 mg | ORAL_TABLET | Freq: Two times a day (BID) | ORAL | 0 refills | Status: DC

## 2016-03-27 NOTE — Telephone Encounter (Signed)
Coreg refill done 03/27/16

## 2016-03-27 NOTE — Telephone Encounter (Signed)
Rx(s) sent to pharmacy electronically.  

## 2016-04-10 DIAGNOSIS — N183 Chronic kidney disease, stage 3 (moderate): Secondary | ICD-10-CM | POA: Diagnosis not present

## 2016-04-10 DIAGNOSIS — E669 Obesity, unspecified: Secondary | ICD-10-CM | POA: Diagnosis not present

## 2016-04-10 DIAGNOSIS — E8770 Fluid overload, unspecified: Secondary | ICD-10-CM | POA: Diagnosis not present

## 2016-04-14 DIAGNOSIS — I259 Chronic ischemic heart disease, unspecified: Secondary | ICD-10-CM | POA: Diagnosis not present

## 2016-04-14 DIAGNOSIS — E785 Hyperlipidemia, unspecified: Secondary | ICD-10-CM | POA: Diagnosis not present

## 2016-04-14 DIAGNOSIS — K219 Gastro-esophageal reflux disease without esophagitis: Secondary | ICD-10-CM | POA: Diagnosis not present

## 2016-04-14 DIAGNOSIS — E039 Hypothyroidism, unspecified: Secondary | ICD-10-CM | POA: Diagnosis not present

## 2016-04-14 DIAGNOSIS — Z6841 Body Mass Index (BMI) 40.0 and over, adult: Secondary | ICD-10-CM | POA: Diagnosis not present

## 2016-04-14 DIAGNOSIS — I482 Chronic atrial fibrillation: Secondary | ICD-10-CM | POA: Diagnosis not present

## 2016-04-14 DIAGNOSIS — I110 Hypertensive heart disease with heart failure: Secondary | ICD-10-CM | POA: Diagnosis not present

## 2016-04-14 DIAGNOSIS — E1122 Type 2 diabetes mellitus with diabetic chronic kidney disease: Secondary | ICD-10-CM | POA: Diagnosis not present

## 2016-04-14 DIAGNOSIS — D689 Coagulation defect, unspecified: Secondary | ICD-10-CM | POA: Diagnosis not present

## 2016-04-14 DIAGNOSIS — Z0000 Encounter for general adult medical examination without abnormal findings: Secondary | ICD-10-CM | POA: Diagnosis not present

## 2016-04-14 DIAGNOSIS — I4892 Unspecified atrial flutter: Secondary | ICD-10-CM | POA: Diagnosis not present

## 2016-04-24 DIAGNOSIS — Z23 Encounter for immunization: Secondary | ICD-10-CM | POA: Diagnosis not present

## 2016-04-26 ENCOUNTER — Ambulatory Visit (INDEPENDENT_AMBULATORY_CARE_PROVIDER_SITE_OTHER): Payer: Medicare HMO | Admitting: Cardiology

## 2016-04-26 ENCOUNTER — Encounter: Payer: Self-pay | Admitting: Cardiology

## 2016-04-26 VITALS — BP 121/72 | HR 84 | Ht 62.0 in | Wt 250.6 lb

## 2016-04-26 DIAGNOSIS — R931 Abnormal findings on diagnostic imaging of heart and coronary circulation: Secondary | ICD-10-CM

## 2016-04-26 DIAGNOSIS — I2510 Atherosclerotic heart disease of native coronary artery without angina pectoris: Secondary | ICD-10-CM

## 2016-04-26 DIAGNOSIS — I2109 ST elevation (STEMI) myocardial infarction involving other coronary artery of anterior wall: Secondary | ICD-10-CM | POA: Diagnosis not present

## 2016-04-26 DIAGNOSIS — E785 Hyperlipidemia, unspecified: Secondary | ICD-10-CM

## 2016-04-26 DIAGNOSIS — R0602 Shortness of breath: Secondary | ICD-10-CM

## 2016-04-26 DIAGNOSIS — I255 Ischemic cardiomyopathy: Secondary | ICD-10-CM

## 2016-04-26 DIAGNOSIS — R9439 Abnormal result of other cardiovascular function study: Secondary | ICD-10-CM

## 2016-04-26 DIAGNOSIS — I10 Essential (primary) hypertension: Secondary | ICD-10-CM | POA: Diagnosis not present

## 2016-04-26 DIAGNOSIS — N183 Chronic kidney disease, stage 3 unspecified: Secondary | ICD-10-CM

## 2016-04-26 DIAGNOSIS — I447 Left bundle-branch block, unspecified: Secondary | ICD-10-CM

## 2016-04-26 DIAGNOSIS — I480 Paroxysmal atrial fibrillation: Secondary | ICD-10-CM

## 2016-04-26 MED ORDER — CARVEDILOL 6.25 MG PO TABS: 6.2500 mg | ORAL_TABLET | Freq: Two times a day (BID) | ORAL | 3 refills | Status: DC

## 2016-04-26 NOTE — Patient Instructions (Signed)
NO CHANGE IN CURRENT MEDICATIONS  CONTINUE WITH CURRENT TREATMENT   Your physician wants you to follow-up in: [place]. You will receive a reminder letter in the mail two months in advance. If you don't receive a letter, please call our office to schedule the follow-up appointment.   If you need a refill on your cardiac medications before your next appointment, please call your pharmacy.

## 2016-04-26 NOTE — Progress Notes (Signed)
PCP: Marton Redwood, MD  Clinic Note: Chief Complaint  Patient presents with  . Follow-up    sob frequently  . Coronary Artery Disease    Status post CABG following STEMI  . Cardiomyopathy    HPI: Elaine [name] is a 75 y.o. female with a PMH below who presents today for annual f/u for CAD-CABGw/ LAA clipping, Afib/flutter. First diagnosed with MI and February 2015 while in A. Fib with RVR and left bundle branch block.she complained of extreme chest heaviness and pressure concerning for possible stenting. She is not a multivessel CAD with high-grade proximal LAD and moderate left main disease. She was referred for urgent CABGx3 with Left Atrial Appendage clipping. She initially was found to have an ischemic cardiomyopathy with EF of 20-25% with now improvement up to 40 - 45%.she has chronic COPD with exertional dyspnea making it very difficult to ascertain the true nature of her symptoms. She has chronic two-pillow orthopnea with mild intermittent edema but denies PND.  No documented recurrence of Afib RVR since 2015.  Elaine [name] was last seen on 05/07/2015. She is doing relatively well at that time but still somewhat weak.  Recent Hospitalizations: n/a  Studies Reviewed: no new studies  Recent trip out [place] to see son. Thoroughly enjoyed it. She was so happy to have been out there and see how well her son is doing. She also indicates that she's had 2 new great-great-grandsons born symptoms last seen her. She is 46 great grandkids.  Interval History: Elaine [name] presents today in great spirits. She is doing quite well. She has been really trying to work on exercising and watching her diet and trying to lose weight. She is finally noticed that she's gotten somewhere with her weight loss. She denies any resting or exertional chest tightness or pressure. No real PND or orthopnea. She does have intermittent edema but is pretty well-controlled. Dr. Florene Glen, her nephrologist has been adjusting her  diuretic. She is up to now 80 mg twice a day says this keeps her swelling pretty much controlled. She has her baseline dyspnea, but is really trying to improve her exercising and walking. Her PCP started on coenzyme Q10 along with her Crestor, and she notes that her legs or not eating as much. She was switched from warfarin to Eliquis by anticoagulation clinic.  No palpitations, lightheadedness, dizziness, weakness or syncope/near syncope. No TIA/amaurosis fugax symptoms. No melena, hematochezia, hematuria, or epstaxis. No claudication.  ROS: A comprehensive was performed. Review of Systems  Constitutional: Positive for weight loss. Negative for malaise/fatigue.  HENT: Negative for congestion and nosebleeds.   Respiratory: Positive for shortness of breath (Baseline).   Cardiovascular: Positive for leg swelling (Stable).  Gastrointestinal: Negative for abdominal pain, constipation and heartburn.  Genitourinary: Negative for dysuria.  Musculoskeletal: Positive for back pain and joint pain (Hips and knees). Negative for myalgias.  Neurological: Positive for dizziness (If she gets up too fast). Negative for focal weakness, loss of consciousness and headaches.  Endo/Heme/Allergies: Bruises/bleeds easily.  Psychiatric/Behavioral: Negative for memory loss. The patient is not nervous/anxious and does not have insomnia.   All other systems reviewed and are negative.  Past Medical History:  Diagnosis Date  . Acute myocardial infarction of anterior wall (HCC) 10/20/2013   Afib, LBBB  . Acute pulmonary edema with congestive heart failure ([place]) 10/20/2013   Resolved  . Arthritis   . Atrial fibrillation ([place]) 10/20/2013   On Warfarin  . CAD, multiple vessel 10/20/2013   LM, LAD &  RCA --> CABG; MYOVIEW 12/'15: Intermediate Risk would large anterior, anteroapical segment the apex possible infarct and peri-infarct ischemia  . CHF (congestive heart failure) (HCC)    EF 40-45%.  . Chronic kidney disease    . COPD (chronic obstructive pulmonary disease) ([place])   . Diabetic neuropathy ([place])   . Heart murmur    Likely related to aortic sclerosis  . Hyperlipidemia   . Hypertension   . Hypothyroidism   . Ischemic cardiomyopathy - Notable Improvement in EF post CABG 10/20/2013   Echo 2/23: EF 20-25%; mild LVH. anteroseptal Akinesis; mid-apical anterior, inferior & inferoseptal + apical-lateral akinesis; G 1 DDysfxn.; Mild-Mod LA dil; mild MR; Mod PHTN;; F/u Echo 12/2013: EF 40-45%, septal & apical HK, mild LVH; Mod LA dilation  . Left bundle branch block (LBBB) on electrocardiogram   . Mild mitral regurgitation by prior echocardiogram    Mild-Mod MR on Echo  . Morbid obesity ([place])   . OSTEOARTHRITIS 08/06/2006  . S/P CABG x 3 10/23/2013   LIMA to LAD, SVG to OM2, SVG to RPLB, EVH via right thigh  . Type II diabetes mellitus with complication ([place]) [id number]   off rx since heart attack 12/15-dr told could stay off if keep cbg under 150  . UTI (lower urinary tract infection) 09/29/14   ongoing now. started rx 09/28/14    Past Surgical History:  Procedure Laterality Date  . ABDOMINAL HYSTERECTOMY    . BREAST LUMPECTOMY W/ NEEDLE LOCALIZATION Right 10/06/2014   Dr Georgette Dover  . BREAST LUMPECTOMY WITH NEEDLE LOCALIZATION Right 10/06/2014   Procedure: RIGHT BREAST LUMPECTOMY WITH NEEDLE LOCALIZATION;  Surgeon: Donnie Mesa, MD;  Location: [facility];  Service: General;  Laterality: Right;  . CLIPPING OF ATRIAL APPENDAGE N/A 10/23/2013   Procedure: CLIPPING OF ATRIAL APPENDAGE;  Surgeon: Rexene Alberts, MD;  Location: [facility];  Service: Open Heart Surgery;  Laterality: N/A;  . 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  . CORONARY ARTERY BYPASS GRAFT N/A 10/23/2013   Procedure: CORONARY ARTERY BYPASS GRAFTING (CABG);  Surgeon: Rexene Alberts, MD;  Location: [facility];  Service: Open Heart Surgery: LIMA-LAD, SVG-OM2, SVG-RPL  . INTRAOPERATIVE TRANSESOPHAGEAL ECHOCARDIOGRAM N/A 10/23/2013   Procedure: INTRAOPERATIVE  TRANSESOPHAGEAL ECHOCARDIOGRAM;  Surgeon: Rexene Alberts, MD;  Location: [facility];  Service: Open Heart Surgery;  Laterality: N/A;  . LEFT HEART CATHETERIZATION WITH CORONARY ANGIOGRAM N/A 10/20/2013   Procedure: LEFT HEART CATHETERIZATION WITH CORONARY ANGIOGRAM;  Surgeon: Leonie Man, MD;  Location[facility] C[facility] LAB;  Service: Cardiovascular: Distal LM ~70%, LAD - ostial 70%, prox 80, 95 & 95%; RCA prox 70%, mid 80%; minimal Cx disease  . LEFT HEART CATHETERIZATION WITH CORONARY/GRAFT ANGIOGRAM N/A 09/08/2014   Procedure: LEFT HEART CATHETERIZATION WITH Beatrix Fetters;  Surgeon: Leonie Man, MD;  Location: [facility] CATH LAB: Indication: Abnormal Myoview. Occluded LAD after 70 and 80% left main. Diffuse RCA disease. Widely patent grafts to moderately diseased artery vessels. Mild-moderate LV dysfunction and chronic A. fib.  [name] NM MYOVIEW LTD  08/14/2014   Lexi scan: INTERMEDIATE RISK - large, severe intensity perfusion defect in anterior wall, apex and inferior apex that is partly reversible. This suggests either scar or possible hibernating myocardium. Nondeviated.-> Likely scar. No change in coronary anatomy with patent grafts.  [name] TOTAL HIP ARTHROPLASTY  2010, 2011   right 2010, left 2011  . TRANSTHORACIC ECHOCARDIOGRAM  12/2013   Mildly dilated left ventricle with mild to moderately reduced function. EF 40-45% (notable improvement from February  2015). Septal and apical hypokinesis. Mild LA dilation.   Myoview December 2015: Intermediate risk stress nuclear study with large, severe intensity perfusion defect of the anterior wall, apex and inferior apex, which is partially reversible (SDS 8) suggesting scar or possibly hibernating myocardium which is ischemic.  Cardiac Cath Jan 2016:  FALSE + Myoview -- Severe native CAD with occluded LAD after 70-80% Left Main, and diffuse RCA disease; Widely patent grafts to moderately diseased target vessels. Known mild to moderate LV dysfunction, and chronic  atrial fibrillation  Prior to Admission medications   Medication Sig Start Date End Date Taking? Authorizing Provider  apixaban (ELIQUIS) 5 MG TABS tablet Take 5 mg by mouth 2 (two) times daily.   Yes Historical Provider, MD  aspirin EC 81 MG tablet Take 1 tablet (81 mg total) by mouth daily. 11/13/13  Yes Daniel J Angiulli, PA-C  carvedilol (COREG) 6.25 MG tablet Take 1 tablet (6.25 mg total) by mouth 2 (two) times daily with a meal. 03/27/16  Yes Leonie Man, MD  Dulaglutide (TRULICITY) A999333 0000000 SOPN Inject into the skin.   Yes Historical Provider, MD  ferrous sulfate 325 (65 FE) MG tablet TAKE ONE TABLET BY MOUTH ONCE DAILY WITH  BREAKFAST 03/22/15  Yes Leonie Man, MD  furosemide (LASIX) 80 MG tablet Take 80 mg by mouth 2 (two) times daily.  08/06/14  Yes Historical Provider, MD  ipratropium-albuterol (DUONEB) 0.5-2.5 (3) MG/3ML SOLN Take 3 mLs by nebulization every 4 (four) hours as needed (for shortness of breath).  04/13/14  Yes Historical Provider, MD  levothyroxine (SYNTHROID) 112 MCG tablet Take 1 tablet (112 mcg total) by mouth daily before breakfast. 02/05/14  Yes Leanna Battles, MD  lisinopril (PRINIVIL,ZESTRIL) 2.5 MG tablet Take 2.5 mg by mouth daily. 08/06/14  Yes Historical Provider, MD  prednisoLONE acetate (PRED FORTE) 1 % ophthalmic suspension Place 1 drop into the right eye daily.   Yes Historical Provider, MD  PROAIR HFA 108 (90 BASE) MCG/ACT inhaler Inhale 1 puff into the lungs daily. 02/06/15  Yes Historical Provider, MD  rosuvastatin (CRESTOR) 20 MG tablet Take 10 mg by mouth at bedtime.    Yes Historical Provider, MD  SYMBICORT 160-4.5 MCG/ACT inhaler Inhale 2 puffs into the lungs 2 (two) times daily as needed (for shortness of breath or wheezing).  04/14/14  Yes Historical Provider, MD   Allergies  Allergen Reactions  . Allopurinol Hives  . Tape Rash and Other (See Comments)    Uncoded Allergy. Allergen: Tape Adhesive tape    Social History   Social  History  . Marital status: Married    Spouse name: N/A  . Number of children: N/A  . Years of education: N/A   Occupational History  . retired    Social History Main Topics  . Smoking status: Former Smoker    Packs/day: 0.50    Years: 39.00    Types: Cigarettes    Quit date: 08/28/1993  . Smokeless tobacco: Never Used  . Alcohol use No     Comment: quit 95  . Drug use: No  . Sexual activity: Not Asked   Other Topics Concern  . None   Social History Narrative   Lives with husband and son in [place].  She is currently retired.   Former smoker quit in 1995.   family history is not on file. - Adopted child   Wt Readings from Last 3 Encounters:  04/26/16 250 lb 9.6 oz (113.7 kg)  05/10/15 264 lb  11.2 oz (120.1 kg)  10/06/14 249 lb 12.8 oz (113.3 kg)    PHYSICAL EXAM BP 121/72   Pulse 84   Ht 5\' 2"  (1.575 m)   Wt 250 lb 9.6 oz (113.7 kg)   BMI 45.84 kg/m  General appearance: alert, cooperative, appears stated age, no distress and morbidly obese Neck: no adenopathy, no carotid bruit and no JVD Lungs: clear to auscultation bilaterally, normal percussion bilaterally and non-labored Heart: regular rate and rhythm, S1, S2 normal, no murmur, click, rub or gallop; unable to palpate PMI Abdomen: soft, non-tender; bowel sounds normal; no masses,  no organomegaly; no HJR Extremities: extremities normal, atraumatic, no cyanosis, and edema 2+ Pulses: 2+ and symmetric - but difficult to palpate pedal pulses due to edema Skin: mobility and turgor normal Neurologic: Mental status: Alert, oriented, thought content appropriate; very pleasant mood & affect Cranial nerves: normal (II-XII grossly intact)   Adult ECG Report  Rate: 82 ;  Rhythm: normal sinus rhythm with 1 AVB (280); left axis deviation/LBBB (-58) with repolarization changes.  Narrative Interpretation: Stable EKG   Other studies Reviewed: Additional studies/ records that were reviewed today include:  Recent Labs:    Followed by PCP. Lab Results  Component Value Date   CHOL 147 10/21/2013   HDL 83 10/21/2013   LDLCALC 50 10/21/2013   TRIG 69 10/21/2013   CHOLHDL 1.8 10/21/2013    ASSESSMENT / PLAN: Problem List Items Addressed This Visit    SOB (shortness of breath)    Somewhat related to deconditioning as well as underlying lung disease. Elaine [name] has improved, but still is short of breath with exertion. She still has underlying chronic systolic and diastolic heart failure component but is on stable regimen.      Relevant Orders   EKG 12-Lead (Completed)   PAF- doing MI and perioperative CABG; CHA2DS2-VASc Score 6, on Eliquis (Chronic)    Maintaining sinus rhythm. As far as I can tell she has not had any recurrences of A. fib. She was diagnosed with A. fib when she had her MI. Currently now on DOAC without significant bleeding issues. The last time she had any symptoms was when she had a Myoview stress test prior to Her relook cath. She could've had a recurrence then. There have been no more symptoms since.      Relevant Medications   apixaban (ELIQUIS) 5 MG TABS tablet   carvedilol (COREG) 6.25 MG tablet   Morbid obesity- BMI 48 (Chronic)    She finally lost back the weight that she again. Really trying to watch her dietary intake now and exercising more. I congratulated her efforts.      Relevant Medications   Dulaglutide (TRULICITY) [id number] 0000000 SOPN   Left main coronary artery disease (Chronic)    Status post CABG.  Follow-up Myoview is abnormal, but the patent grafts on cath. She is on beta blocker, ACE inhibitor and statin.      Relevant Medications   apixaban (ELIQUIS) 5 MG TABS tablet   carvedilol (COREG) 6.25 MG tablet   Other Relevant Orders   EKG 12-Lead (Completed)   Left bundle branch block (LBBB) on electrocardiogram (Chronic)   ICM EF by Echo 20-25% Feb 2015, recently improved to 40-45% by echo 01/13/14 (Chronic)    She has baseline dyspnea which I don't think is much  different than what she had prior to her MI. Certainly has exertional shortness of breath, but is very deconditioned. No PND or orthopnea and minimal edema now that  she is on stable dose of Lasix. Lasix is monitored by her nephrologist. Seems to be relatively euvolemic daily. On stable dose of carvedilol and lisinopril.      Relevant Medications   apixaban (ELIQUIS) 5 MG TABS tablet   carvedilol (COREG) 6.25 MG tablet   Hyperlipidemia with target LDL less than 70 (Chronic)    On Crestor 10 mg daily along with coenzyme Q 10. Followed closely by PCP. Labs. But she said they looked better. May need to increase dose of Crestor. She is currently now as far as tell on 20 mg as opposed to 10.      Relevant Medications   apixaban (ELIQUIS) 5 MG TABS tablet   carvedilol (COREG) 6.25 MG tablet   Essential hypertension (Chronic)    Excellent control. On stable regimen. Also monitored by Dr. Florene Glen from nephrology      Relevant Medications   apixaban (ELIQUIS) 5 MG TABS tablet   carvedilol (COREG) 6.25 MG tablet   Other Relevant Orders   EKG 12-Lead (Completed)   Chronic kidney disease, stage III (moderate) (Chronic)    Followed by Dr. Florene Glen.      CAD status post CABG - Primary (Chronic)    Stable. On stable regimen. Last evaluation was February 2016. Would probably not recheck until 2020/2021 unless she has symptoms.      Relevant Medications   apixaban (ELIQUIS) 5 MG TABS tablet   carvedilol (COREG) 6.25 MG tablet   Other Relevant Orders   EKG 12-Lead (Completed)   Acute myocardial infarction of anterior wall, subsequent episode of care ([place]) (Chronic)    Likely the source of the large defect noted on Myoview. No recurrent MI or heart failure symptoms no anginal symptoms. Despite having a reduced EF, she is not having any heart failure. Not on antiplatelet agent other than aspirin because she is now on Eliquis.      Relevant Medications   apixaban (ELIQUIS) 5 MG TABS tablet    carvedilol (COREG) 6.25 MG tablet   Abnormal nuclear stress test    Other Visit Diagnoses   None.     Current medicines are reviewed at length with the patient today. (+/- concerns) n/a The following changes have been made:  [place] TREATMENT   Your physician wants you to follow-up in: [place].  Studies Ordered:   Orders Placed This Encounter  Procedures  . EKG 12-Lead      Glenetta Hew, M.D., M.S. Interventional Cardiologist   Pager # [phone] Phone # [phone] [address] [place], [place] [address]

## 2016-04-28 ENCOUNTER — Encounter: Payer: Self-pay | Admitting: Cardiology

## 2016-04-28 NOTE — Assessment & Plan Note (Signed)
Status post CABG.  Follow-up Myoview is abnormal, but the patent grafts on cath. She is on beta blocker, ACE inhibitor and statin.

## 2016-04-28 NOTE — Assessment & Plan Note (Signed)
She finally lost back the weight that she again. Really trying to watch her dietary intake now and exercising more. I congratulated her efforts.

## 2016-04-28 NOTE — Assessment & Plan Note (Signed)
Followed by Dr Powell 

## 2016-04-28 NOTE — Assessment & Plan Note (Signed)
Somewhat related to deconditioning as well as underlying lung disease. Elaine [name] has improved, but still is short of breath with exertion. She still has underlying chronic systolic and diastolic heart failure component but is on stable regimen.

## 2016-04-28 NOTE — Assessment & Plan Note (Signed)
On Crestor 10 mg daily along with coenzyme Q 10. Followed closely by PCP. Labs. But she said they looked better. May need to increase dose of Crestor. She is currently now as far as tell on 20 mg as opposed to 10.

## 2016-04-28 NOTE — Assessment & Plan Note (Signed)
Maintaining sinus rhythm. As far as I can tell she has not had any recurrences of A. fib. She was diagnosed with A. fib when she had her MI. Currently now on DOAC without significant bleeding issues. The last time she had any symptoms was when she had a Myoview stress test prior to Her relook cath. She could've had a recurrence then. There have been no more symptoms since.

## 2016-04-28 NOTE — Assessment & Plan Note (Signed)
Excellent control. On stable regimen. Also monitored by Dr. Florene Glen from nephrology

## 2016-04-28 NOTE — Assessment & Plan Note (Signed)
Likely the source of the large defect noted on Myoview. No recurrent MI or heart failure symptoms no anginal symptoms. Despite having a reduced EF, she is not having any heart failure. Not on antiplatelet agent other than aspirin because she is now on Eliquis.

## 2016-04-28 NOTE — Assessment & Plan Note (Signed)
She has baseline dyspnea which I don't think is much different than what she had prior to her MI. Certainly has exertional shortness of breath, but is very deconditioned. No PND or orthopnea and minimal edema now that she is on stable dose of Lasix. Lasix is monitored by her nephrologist. Seems to be relatively euvolemic daily. On stable dose of carvedilol and lisinopril.

## 2016-04-28 NOTE — Assessment & Plan Note (Addendum)
Stable. On stable regimen. Last evaluation was February 2016. Would probably not recheck until 2020/2021 unless she has symptoms.

## 2016-05-16 DIAGNOSIS — H35032 Hypertensive retinopathy, left eye: Secondary | ICD-10-CM | POA: Diagnosis not present

## 2016-05-16 DIAGNOSIS — H353111 Nonexudative age-related macular degeneration, right eye, early dry stage: Secondary | ICD-10-CM | POA: Diagnosis not present

## 2016-05-16 DIAGNOSIS — E119 Type 2 diabetes mellitus without complications: Secondary | ICD-10-CM | POA: Diagnosis not present

## 2016-05-16 DIAGNOSIS — H26492 Other secondary cataract, left eye: Secondary | ICD-10-CM | POA: Diagnosis not present

## 2016-06-01 DIAGNOSIS — I10 Essential (primary) hypertension: Secondary | ICD-10-CM | POA: Diagnosis not present

## 2016-06-01 DIAGNOSIS — N390 Urinary tract infection, site not specified: Secondary | ICD-10-CM | POA: Diagnosis not present

## 2016-06-01 DIAGNOSIS — M109 Gout, unspecified: Secondary | ICD-10-CM | POA: Diagnosis not present

## 2016-06-01 DIAGNOSIS — M810 Age-related osteoporosis without current pathological fracture: Secondary | ICD-10-CM | POA: Diagnosis not present

## 2016-06-01 DIAGNOSIS — E1129 Type 2 diabetes mellitus with other diabetic kidney complication: Secondary | ICD-10-CM | POA: Diagnosis not present

## 2016-06-01 DIAGNOSIS — E038 Other specified hypothyroidism: Secondary | ICD-10-CM | POA: Diagnosis not present

## 2016-06-01 DIAGNOSIS — R8299 Other abnormal findings in urine: Secondary | ICD-10-CM | POA: Diagnosis not present

## 2016-06-01 DIAGNOSIS — E784 Other hyperlipidemia: Secondary | ICD-10-CM | POA: Diagnosis not present

## 2016-06-08 DIAGNOSIS — I10 Essential (primary) hypertension: Secondary | ICD-10-CM | POA: Diagnosis not present

## 2016-06-08 DIAGNOSIS — I25810 Atherosclerosis of coronary artery bypass graft(s) without angina pectoris: Secondary | ICD-10-CM | POA: Diagnosis not present

## 2016-06-08 DIAGNOSIS — Z1212 Encounter for screening for malignant neoplasm of rectum: Secondary | ICD-10-CM | POA: Diagnosis not present

## 2016-06-08 DIAGNOSIS — E784 Other hyperlipidemia: Secondary | ICD-10-CM | POA: Diagnosis not present

## 2016-06-08 DIAGNOSIS — N183 Chronic kidney disease, stage 3 (moderate): Secondary | ICD-10-CM | POA: Diagnosis not present

## 2016-06-08 DIAGNOSIS — I5022 Chronic systolic (congestive) heart failure: Secondary | ICD-10-CM | POA: Diagnosis not present

## 2016-06-08 DIAGNOSIS — D6489 Other specified anemias: Secondary | ICD-10-CM | POA: Diagnosis not present

## 2016-06-08 DIAGNOSIS — Z0000 Encounter for general adult medical examination without abnormal findings: Secondary | ICD-10-CM | POA: Diagnosis not present

## 2016-06-08 DIAGNOSIS — E038 Other specified hypothyroidism: Secondary | ICD-10-CM | POA: Diagnosis not present

## 2016-06-08 DIAGNOSIS — E1149 Type 2 diabetes mellitus with other diabetic neurological complication: Secondary | ICD-10-CM | POA: Diagnosis not present

## 2016-06-19 ENCOUNTER — Other Ambulatory Visit: Payer: Self-pay | Admitting: Internal Medicine

## 2016-06-19 DIAGNOSIS — Z1231 Encounter for screening mammogram for malignant neoplasm of breast: Secondary | ICD-10-CM

## 2016-07-18 ENCOUNTER — Ambulatory Visit
Admission: RE | Admit: 2016-07-18 | Discharge: 2016-07-18 | Disposition: A | Discharge status: Home / Self Care | Payer: Medicare HMO | Source: Ambulatory Visit | Attending: Internal Medicine | Admitting: Internal Medicine

## 2016-07-18 DIAGNOSIS — Z1231 Encounter for screening mammogram for malignant neoplasm of breast: Secondary | ICD-10-CM

## 2016-07-24 ENCOUNTER — Ambulatory Visit
Admission: RE | Admit: 2016-07-24 | Discharge: 2016-07-24 | Disposition: A | Discharge status: Home / Self Care | Payer: Medicare HMO | Source: Ambulatory Visit | Attending: Registered Nurse | Admitting: Registered Nurse

## 2016-07-24 ENCOUNTER — Other Ambulatory Visit: Payer: Self-pay | Admitting: Internal Medicine

## 2016-07-24 ENCOUNTER — Other Ambulatory Visit: Payer: Self-pay | Admitting: Registered Nurse

## 2016-07-24 DIAGNOSIS — Z6841 Body Mass Index (BMI) 40.0 and over, adult: Secondary | ICD-10-CM | POA: Diagnosis not present

## 2016-07-24 DIAGNOSIS — R059 Cough, unspecified: Secondary | ICD-10-CM

## 2016-07-24 DIAGNOSIS — R0602 Shortness of breath: Secondary | ICD-10-CM | POA: Diagnosis not present

## 2016-07-24 DIAGNOSIS — R928 Other abnormal and inconclusive findings on diagnostic imaging of breast: Secondary | ICD-10-CM

## 2016-07-24 DIAGNOSIS — J209 Acute bronchitis, unspecified: Secondary | ICD-10-CM | POA: Diagnosis not present

## 2016-07-24 DIAGNOSIS — R05 Cough: Secondary | ICD-10-CM

## 2016-07-31 ENCOUNTER — Other Ambulatory Visit: Payer: Self-pay | Admitting: Internal Medicine

## 2016-07-31 ENCOUNTER — Ambulatory Visit
Admission: RE | Admit: 2016-07-31 | Discharge: 2016-07-31 | Disposition: A | Discharge status: Home / Self Care | Payer: Medicare HMO | Source: Ambulatory Visit | Attending: Internal Medicine | Admitting: Internal Medicine

## 2016-07-31 DIAGNOSIS — L270 Generalized skin eruption due to drugs and medicaments taken internally: Secondary | ICD-10-CM | POA: Diagnosis not present

## 2016-07-31 DIAGNOSIS — R928 Other abnormal and inconclusive findings on diagnostic imaging of breast: Secondary | ICD-10-CM

## 2016-07-31 DIAGNOSIS — J449 Chronic obstructive pulmonary disease, unspecified: Secondary | ICD-10-CM | POA: Diagnosis not present

## 2016-07-31 DIAGNOSIS — R0600 Dyspnea, unspecified: Secondary | ICD-10-CM | POA: Diagnosis not present

## 2016-07-31 DIAGNOSIS — R921 Mammographic calcification found on diagnostic imaging of breast: Secondary | ICD-10-CM

## 2016-07-31 DIAGNOSIS — J209 Acute bronchitis, unspecified: Secondary | ICD-10-CM | POA: Diagnosis not present

## 2016-07-31 DIAGNOSIS — Z6841 Body Mass Index (BMI) 40.0 and over, adult: Secondary | ICD-10-CM | POA: Diagnosis not present

## 2016-08-04 ENCOUNTER — Ambulatory Visit
Admission: RE | Admit: 2016-08-04 | Discharge: 2016-08-04 | Disposition: A | Discharge status: Home / Self Care | Payer: Medicare HMO | Source: Ambulatory Visit | Attending: Internal Medicine | Admitting: Internal Medicine

## 2016-08-04 DIAGNOSIS — D241 Benign neoplasm of right breast: Secondary | ICD-10-CM | POA: Diagnosis not present

## 2016-08-04 DIAGNOSIS — R921 Mammographic calcification found on diagnostic imaging of breast: Secondary | ICD-10-CM

## 2016-08-04 HISTORY — PX: BREAST BIOPSY: SHX20

## 2016-08-31 DIAGNOSIS — R69 Illness, unspecified: Secondary | ICD-10-CM | POA: Diagnosis not present

## 2016-09-12 DIAGNOSIS — E039 Hypothyroidism, unspecified: Secondary | ICD-10-CM | POA: Diagnosis not present

## 2016-09-12 DIAGNOSIS — I259 Chronic ischemic heart disease, unspecified: Secondary | ICD-10-CM | POA: Diagnosis not present

## 2016-09-12 DIAGNOSIS — Z7982 Long term (current) use of aspirin: Secondary | ICD-10-CM | POA: Diagnosis not present

## 2016-09-12 DIAGNOSIS — K5900 Constipation, unspecified: Secondary | ICD-10-CM | POA: Diagnosis not present

## 2016-09-12 DIAGNOSIS — D509 Iron deficiency anemia, unspecified: Secondary | ICD-10-CM | POA: Diagnosis not present

## 2016-09-12 DIAGNOSIS — N189 Chronic kidney disease, unspecified: Secondary | ICD-10-CM | POA: Diagnosis not present

## 2016-09-12 DIAGNOSIS — K08409 Partial loss of teeth, unspecified cause, unspecified class: Secondary | ICD-10-CM | POA: Diagnosis not present

## 2016-09-12 DIAGNOSIS — M159 Polyosteoarthritis, unspecified: Secondary | ICD-10-CM | POA: Diagnosis not present

## 2016-09-12 DIAGNOSIS — M47819 Spondylosis without myelopathy or radiculopathy, site unspecified: Secondary | ICD-10-CM | POA: Diagnosis not present

## 2016-09-12 DIAGNOSIS — Z0000 Encounter for general adult medical examination without abnormal findings: Secondary | ICD-10-CM | POA: Diagnosis not present

## 2016-09-12 DIAGNOSIS — Z7984 Long term (current) use of oral hypoglycemic drugs: Secondary | ICD-10-CM | POA: Diagnosis not present

## 2016-09-12 DIAGNOSIS — Z79899 Other long term (current) drug therapy: Secondary | ICD-10-CM | POA: Diagnosis not present

## 2016-09-12 DIAGNOSIS — E785 Hyperlipidemia, unspecified: Secondary | ICD-10-CM | POA: Diagnosis not present

## 2016-09-12 DIAGNOSIS — I482 Chronic atrial fibrillation: Secondary | ICD-10-CM | POA: Diagnosis not present

## 2016-09-12 DIAGNOSIS — E1122 Type 2 diabetes mellitus with diabetic chronic kidney disease: Secondary | ICD-10-CM | POA: Diagnosis not present

## 2016-09-12 DIAGNOSIS — I130 Hypertensive heart and chronic kidney disease with heart failure and stage 1 through stage 4 chronic kidney disease, or unspecified chronic kidney disease: Secondary | ICD-10-CM | POA: Diagnosis not present

## 2016-09-12 DIAGNOSIS — Z7901 Long term (current) use of anticoagulants: Secondary | ICD-10-CM | POA: Diagnosis not present

## 2016-09-12 DIAGNOSIS — Z9849 Cataract extraction status, unspecified eye: Secondary | ICD-10-CM | POA: Diagnosis not present

## 2016-09-12 DIAGNOSIS — H579 Unspecified disorder of eye and adnexa: Secondary | ICD-10-CM | POA: Diagnosis not present

## 2016-09-12 DIAGNOSIS — Z9989 Dependence on other enabling machines and devices: Secondary | ICD-10-CM | POA: Diagnosis not present

## 2016-09-25 DIAGNOSIS — M109 Gout, unspecified: Secondary | ICD-10-CM | POA: Diagnosis not present

## 2016-09-25 DIAGNOSIS — N183 Chronic kidney disease, stage 3 (moderate): Secondary | ICD-10-CM | POA: Diagnosis not present

## 2016-09-25 DIAGNOSIS — E8770 Fluid overload, unspecified: Secondary | ICD-10-CM | POA: Diagnosis not present

## 2016-10-06 DIAGNOSIS — E1129 Type 2 diabetes mellitus with other diabetic kidney complication: Secondary | ICD-10-CM | POA: Diagnosis not present

## 2016-10-06 DIAGNOSIS — I252 Old myocardial infarction: Secondary | ICD-10-CM | POA: Diagnosis not present

## 2016-10-06 DIAGNOSIS — E784 Other hyperlipidemia: Secondary | ICD-10-CM | POA: Diagnosis not present

## 2016-10-06 DIAGNOSIS — I25810 Atherosclerosis of coronary artery bypass graft(s) without angina pectoris: Secondary | ICD-10-CM | POA: Diagnosis not present

## 2016-10-06 DIAGNOSIS — I10 Essential (primary) hypertension: Secondary | ICD-10-CM | POA: Diagnosis not present

## 2016-10-06 DIAGNOSIS — J449 Chronic obstructive pulmonary disease, unspecified: Secondary | ICD-10-CM | POA: Diagnosis not present

## 2016-10-06 DIAGNOSIS — I4891 Unspecified atrial fibrillation: Secondary | ICD-10-CM | POA: Diagnosis not present

## 2016-10-06 DIAGNOSIS — E1149 Type 2 diabetes mellitus with other diabetic neurological complication: Secondary | ICD-10-CM | POA: Diagnosis not present

## 2016-10-06 DIAGNOSIS — Z7901 Long term (current) use of anticoagulants: Secondary | ICD-10-CM | POA: Diagnosis not present

## 2016-10-06 DIAGNOSIS — I5022 Chronic systolic (congestive) heart failure: Secondary | ICD-10-CM | POA: Diagnosis not present

## 2016-10-24 ENCOUNTER — Encounter: Payer: Self-pay | Admitting: Gastroenterology

## 2016-11-14 DIAGNOSIS — H26492 Other secondary cataract, left eye: Secondary | ICD-10-CM | POA: Diagnosis not present

## 2016-11-14 DIAGNOSIS — H35032 Hypertensive retinopathy, left eye: Secondary | ICD-10-CM | POA: Diagnosis not present

## 2016-11-14 DIAGNOSIS — E119 Type 2 diabetes mellitus without complications: Secondary | ICD-10-CM | POA: Diagnosis not present

## 2016-11-14 DIAGNOSIS — H353111 Nonexudative age-related macular degeneration, right eye, early dry stage: Secondary | ICD-10-CM | POA: Diagnosis not present

## 2016-11-20 DIAGNOSIS — R69 Illness, unspecified: Secondary | ICD-10-CM | POA: Diagnosis not present

## 2016-11-21 DIAGNOSIS — R69 Illness, unspecified: Secondary | ICD-10-CM | POA: Diagnosis not present

## 2016-12-04 DIAGNOSIS — I5022 Chronic systolic (congestive) heart failure: Secondary | ICD-10-CM | POA: Diagnosis not present

## 2016-12-04 DIAGNOSIS — J441 Chronic obstructive pulmonary disease with (acute) exacerbation: Secondary | ICD-10-CM | POA: Diagnosis not present

## 2016-12-04 DIAGNOSIS — Z6841 Body Mass Index (BMI) 40.0 and over, adult: Secondary | ICD-10-CM | POA: Diagnosis not present

## 2016-12-04 DIAGNOSIS — R0600 Dyspnea, unspecified: Secondary | ICD-10-CM | POA: Diagnosis not present

## 2016-12-08 DIAGNOSIS — N183 Chronic kidney disease, stage 3 (moderate): Secondary | ICD-10-CM | POA: Diagnosis not present

## 2016-12-08 DIAGNOSIS — I509 Heart failure, unspecified: Secondary | ICD-10-CM | POA: Diagnosis not present

## 2016-12-08 DIAGNOSIS — I5022 Chronic systolic (congestive) heart failure: Secondary | ICD-10-CM | POA: Diagnosis not present

## 2016-12-08 DIAGNOSIS — J441 Chronic obstructive pulmonary disease with (acute) exacerbation: Secondary | ICD-10-CM | POA: Diagnosis not present

## 2016-12-22 DIAGNOSIS — I10 Essential (primary) hypertension: Secondary | ICD-10-CM | POA: Diagnosis not present

## 2017-01-05 DIAGNOSIS — I10 Essential (primary) hypertension: Secondary | ICD-10-CM | POA: Diagnosis not present

## 2017-02-15 DIAGNOSIS — N184 Chronic kidney disease, stage 4 (severe): Secondary | ICD-10-CM | POA: Diagnosis not present

## 2017-02-15 DIAGNOSIS — Z6841 Body Mass Index (BMI) 40.0 and over, adult: Secondary | ICD-10-CM | POA: Diagnosis not present

## 2017-02-15 DIAGNOSIS — Z7901 Long term (current) use of anticoagulants: Secondary | ICD-10-CM | POA: Diagnosis not present

## 2017-02-15 DIAGNOSIS — I5022 Chronic systolic (congestive) heart failure: Secondary | ICD-10-CM | POA: Diagnosis not present

## 2017-02-15 DIAGNOSIS — E1129 Type 2 diabetes mellitus with other diabetic kidney complication: Secondary | ICD-10-CM | POA: Diagnosis not present

## 2017-02-15 DIAGNOSIS — I10 Essential (primary) hypertension: Secondary | ICD-10-CM | POA: Diagnosis not present

## 2017-02-15 DIAGNOSIS — J449 Chronic obstructive pulmonary disease, unspecified: Secondary | ICD-10-CM | POA: Diagnosis not present

## 2017-02-15 DIAGNOSIS — E1149 Type 2 diabetes mellitus with other diabetic neurological complication: Secondary | ICD-10-CM | POA: Diagnosis not present

## 2017-02-15 DIAGNOSIS — I4891 Unspecified atrial fibrillation: Secondary | ICD-10-CM | POA: Diagnosis not present

## 2017-03-07 ENCOUNTER — Other Ambulatory Visit: Payer: Self-pay | Admitting: Internal Medicine

## 2017-03-07 DIAGNOSIS — R921 Mammographic calcification found on diagnostic imaging of breast: Secondary | ICD-10-CM

## 2017-03-19 ENCOUNTER — Ambulatory Visit
Admission: RE | Admit: 2017-03-19 | Discharge: 2017-03-19 | Disposition: A | Discharge status: Home / Self Care | Payer: Medicare HMO | Source: Ambulatory Visit | Attending: Internal Medicine | Admitting: Internal Medicine

## 2017-03-19 ENCOUNTER — Other Ambulatory Visit: Payer: Self-pay | Admitting: Internal Medicine

## 2017-03-19 DIAGNOSIS — R921 Mammographic calcification found on diagnostic imaging of breast: Secondary | ICD-10-CM

## 2017-03-19 DIAGNOSIS — R928 Other abnormal and inconclusive findings on diagnostic imaging of breast: Secondary | ICD-10-CM | POA: Diagnosis not present

## 2017-04-03 DIAGNOSIS — N183 Chronic kidney disease, stage 3 (moderate): Secondary | ICD-10-CM | POA: Diagnosis not present

## 2017-04-03 DIAGNOSIS — R42 Dizziness and giddiness: Secondary | ICD-10-CM | POA: Diagnosis not present

## 2017-04-03 DIAGNOSIS — E8770 Fluid overload, unspecified: Secondary | ICD-10-CM | POA: Diagnosis not present

## 2017-04-23 ENCOUNTER — Ambulatory Visit (INDEPENDENT_AMBULATORY_CARE_PROVIDER_SITE_OTHER): Payer: Medicare HMO | Admitting: Cardiology

## 2017-04-23 VITALS — BP 117/70 | HR 82 | Ht 62.0 in | Wt 259.0 lb

## 2017-04-23 DIAGNOSIS — E785 Hyperlipidemia, unspecified: Secondary | ICD-10-CM

## 2017-04-23 DIAGNOSIS — R600 Localized edema: Secondary | ICD-10-CM

## 2017-04-23 DIAGNOSIS — I255 Ischemic cardiomyopathy: Secondary | ICD-10-CM

## 2017-04-23 DIAGNOSIS — I10 Essential (primary) hypertension: Secondary | ICD-10-CM | POA: Diagnosis not present

## 2017-04-23 DIAGNOSIS — R0600 Dyspnea, unspecified: Secondary | ICD-10-CM

## 2017-04-23 DIAGNOSIS — I2510 Atherosclerotic heart disease of native coronary artery without angina pectoris: Secondary | ICD-10-CM | POA: Diagnosis not present

## 2017-04-23 DIAGNOSIS — I2109 ST elevation (STEMI) myocardial infarction involving other coronary artery of anterior wall: Secondary | ICD-10-CM | POA: Diagnosis not present

## 2017-04-23 DIAGNOSIS — R0609 Other forms of dyspnea: Secondary | ICD-10-CM | POA: Diagnosis not present

## 2017-04-23 DIAGNOSIS — I480 Paroxysmal atrial fibrillation: Secondary | ICD-10-CM | POA: Diagnosis not present

## 2017-04-23 NOTE — Progress Notes (Signed)
PCP: Marton Redwood, MD  Clinic Note: Chief Complaint  Patient presents with  . Follow-up    swelling in foot   . Coronary Artery Disease    CABG  . Atrial Fibrillation    HPI: Elaine [name] [facility]) is a 76 y.o. female with a PMH below who presents today for Annual follow-up of CAD. She also notes some lower extremity swelling. MI February 2015 - while in A. fib RVR and LBBB. -> ? STEMI --> cardiac cath revealed multivessel CAD with high-grade proximal LAD and moderate left main disease referred for urgent CABG along with left atrial appendage clipping.   H/o CAD-CABGw/ LAA clipping,   Ischemic Cardiomyopathy(EF improved from  20-25% up to 40 and 45%) - 3  Afib/flutter & - last documented A. Fib RVR was in 2015.  Last Myoview December 2015 and echocardiogram May 2015. Most recent cath was in January 2016 showing patent grafts.  Elaine [name] was last seen on 04/26/2016 when she noted exertional dyspnea that was relatively stable.  Recent Hospitalizations: None  Studies Personally Reviewed - (if available, images/films reviewed: From Epic Chart or Care Everywhere)  None  Interval History: "Elaine [name]" presents here today really without any major cardiac complaints. She denies any sensation of irregular heartbeats or palpitations. She denies any chest tightness or pressure with rest or exertion, but does note exertional dyspnea and intermittent edema. But she tells me right now her foot is swollen (left foot) because of recently diagnosed gout. She's been noticing some significant pain in the left ankle, and is hoping that she can get her prescription for Uloric filled, but is worried that she may not have enough money. Apparently she's been having issues with her husband who is showing signs of dementia. He  has been doing unusual things such as taking money out of the bank account. Apparently, last night she got very upset and yelled at him. She thinks maybe she felt her heart rate go  fast, but otherwise has been feeling quite fine. She denies any melena, hematochezia or hematuria/epistaxis. No PND, orthopnea and not really any true edema as the ankles more swollen because of gout. Besides last night feeling her heart rate go low but fast, she has not noticed any palpitations, no sig 3/near syncope or TIA/amaurosis fugax.  No lifestyle limiting claudication, although it's hard to really get a true story from her.  ROS: A comprehensive was performed. Review of Systems  Constitutional: Negative for malaise/fatigue and weight loss.  HENT: Negative for congestion.   Respiratory: Positive for shortness of breath (No different than usual). Negative for cough.   Cardiovascular: Positive for leg swelling (Left ankle).  Gastrointestinal: Negative for abdominal pain and heartburn.  Genitourinary: Negative for frequency.  Musculoskeletal: Positive for joint pain.  Neurological: Positive for dizziness (Sometimes she gets up too fast).  Psychiatric/Behavioral: Negative for memory loss. The patient is nervous/anxious (Anxious about paying her bills, very tearful today.). The patient does not have insomnia.   All other systems reviewed and are negative.  I have reviewed and (if needed) personally updated the patient's problem list, medications, allergies, past medical and surgical history, social and family history.   Past Medical History:  Diagnosis Date  . Acute myocardial infarction of anterior wall (HCC) 10/20/2013   Afib, LBBB  . Acute pulmonary edema with congestive heart failure ([place]) 10/20/2013   Resolved  . Arthritis   . Atrial fibrillation ([place]) 10/20/2013   On Warfarin  . CAD, multiple vessel  10/20/2013   LM, LAD & RCA --> CABG; MYOVIEW 12/'15: Intermediate Risk would large anterior, anteroapical segment the apex possible infarct and peri-infarct ischemia  . CHF (congestive heart failure) (HCC)    EF 40-45%.  . Chronic kidney disease   . COPD (chronic obstructive  pulmonary disease) ([place])   . Diabetic neuropathy ([place])   . Heart murmur    Likely related to aortic sclerosis  . Hyperlipidemia   . Hypertension   . Hypothyroidism   . Ischemic cardiomyopathy - Notable Improvement in EF post CABG 10/20/2013   Echo 2/23: EF 20-25%; mild LVH. anteroseptal Akinesis; mid-apical anterior, inferior & inferoseptal + apical-lateral akinesis; G 1 DDysfxn.; Mild-Mod LA dil; mild MR; Mod PHTN;; F/u Echo 12/2013: EF 40-45%, septal & apical HK, mild LVH; Mod LA dilation  . Left bundle branch block (LBBB) on electrocardiogram   . Mild mitral regurgitation by prior echocardiogram    Mild-Mod MR on Echo  . Morbid obesity ([place])   . OSTEOARTHRITIS 08/06/2006  . S/P CABG x 3 10/23/2013   LIMA to LAD, SVG to OM2, SVG to RPLB, EVH via right thigh  . Type II diabetes mellitus with complication ([place]) 43/15/4008   off rx since heart attack 12/15-dr told could stay off if keep cbg under 150  . UTI (lower urinary tract infection) 09/29/14   ongoing now. started rx 09/28/14    Past Surgical History:  Procedure Laterality Date  . ABDOMINAL HYSTERECTOMY    . BREAST LUMPECTOMY W/ NEEDLE LOCALIZATION Right 10/06/2014   Dr Georgette Dover  . BREAST LUMPECTOMY WITH NEEDLE LOCALIZATION Right 10/06/2014   Procedure: RIGHT BREAST LUMPECTOMY WITH NEEDLE LOCALIZATION;  Surgeon: Donnie Mesa, MD;  Location: [place];  Service: General;  Laterality: Right;  . CLIPPING OF ATRIAL APPENDAGE N/A 10/23/2013   Procedure: CLIPPING OF ATRIAL APPENDAGE;  Surgeon: Rexene Alberts, MD;  Location: [place];  Service: Open Heart Surgery;  Laterality: N/A;  . 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  . CORONARY ARTERY BYPASS GRAFT N/A 10/23/2013   Procedure: CORONARY ARTERY BYPASS GRAFTING (CABG);  Surgeon: Rexene Alberts, MD;  Location: [place];  Service: Open Heart Surgery: LIMA-LAD, SVG-OM2, SVG-RPL  . INTRAOPERATIVE TRANSESOPHAGEAL ECHOCARDIOGRAM N/A 10/23/2013   Procedure: INTRAOPERATIVE TRANSESOPHAGEAL ECHOCARDIOGRAM;   Surgeon: Rexene Alberts, MD;  Location: [place];  Service: Open Heart Surgery;  Laterality: N/A;  . LEFT HEART CATHETERIZATION WITH CORONARY ANGIOGRAM N/A 10/20/2013   Procedure: LEFT HEART CATHETERIZATION WITH CORONARY ANGIOGRAM;  Surgeon: Leonie Man, MD;  Location: [facility] CATH LAB;  Service: Cardiovascular: Distal LM ~70%, LAD - ostial 70%, prox 80, 95 & 95%; RCA prox 70%, mid 80%; minimal Cx disease  . LEFT HEART CATHETERIZATION WITH CORONARY/GRAFT ANGIOGRAM N/A 09/08/2014   Procedure: LEFT HEART CATHETERIZATION WITH Beatrix Fetters;  Surgeon: Leonie Man, MD;  Location: [facility] CATH LAB: Indication: Abnormal Myoview. Occluded LAD after 70 and 80% left main. Diffuse RCA disease. Widely patent grafts to moderately diseased artery vessels. Mild-moderate LV dysfunction and chronic A. fib.  [name] NM MYOVIEW LTD  08/14/2014   Lexi scan: INTERMEDIATE RISK - large, severe intensity perfusion defect in anterior wall, apex and inferior apex that is partly reversible. This suggests either scar or possible hibernating myocardium. Nondeviated.-> Likely scar. No change in coronary anatomy with patent grafts.  [name] TOTAL HIP ARTHROPLASTY  2010, 2011   right 2010, left 2011  . TRANSTHORACIC ECHOCARDIOGRAM  12/2013   Mildly dilated left ventricle with mild to moderately reduced function.  EF 40-45% (notable improvement from February 2015). Septal and apical hypokinesis. Mild LA dilation.    Current Meds  Medication Sig  . apixaban (ELIQUIS) 5 MG TABS tablet Take 5 mg by mouth 2 (two) times daily.  [name] aspirin EC 81 MG tablet Take 1 tablet (81 mg total) by mouth daily.  . carvedilol (COREG) 6.25 MG tablet Take 1 tablet (6.25 mg total) by mouth 2 (two) times daily with a meal.  . Dulaglutide (TRULICITY) 1.54 MG/8.6PY SOPN Inject into the skin.  . ferrous sulfate 325 (65 FE) MG tablet TAKE ONE TABLET BY MOUTH ONCE DAILY WITH  BREAKFAST  . furosemide (LASIX) 80 MG tablet Take 80 mg by mouth daily.   [name]  ipratropium-albuterol (DUONEB) 0.5-2.5 (3) MG/3ML SOLN Take 3 mLs by nebulization every 4 (four) hours as needed (for shortness of breath).   [name] levothyroxine (SYNTHROID) 112 MCG tablet Take 1 tablet (112 mcg total) by mouth daily before breakfast.  . lisinopril (PRINIVIL,ZESTRIL) 2.5 MG tablet Take 2.5 mg by mouth daily.  . prednisoLONE acetate (PRED FORTE) 1 % ophthalmic suspension Place 1 drop into the right eye daily.  [name] PROAIR HFA 108 (90 BASE) MCG/ACT inhaler Inhale 1 puff into the lungs daily.  . rosuvastatin (CRESTOR) 20 MG tablet Take 10 mg by mouth at bedtime.     Allergies  Allergen Reactions  . Allopurinol Hives  . Tape Rash and Other (See Comments)    Uncoded Allergy. Allergen: Tape Adhesive tape    Social History   Social History  . Marital status: Married    Spouse name: N/A  . Number of children: N/A  . Years of education: N/A   Occupational History  . retired    Social History Main Topics  . Smoking status: Former Smoker    Packs/day: 0.50    Years: 39.00    Types: Cigarettes    Quit date: 08/28/1993  . Smokeless tobacco: Never Used  . Alcohol use No     Comment: quit 95  . Drug use: No  . Sexual activity: Not Asked   Other Topics Concern  . None   Social History Narrative   Lives with husband and son in [place].  She is currently retired.   Former smoker quit in 1995.    family history is not on file.  Wt Readings from Last 3 Encounters:  04/23/17 259 lb (117.5 kg)  04/26/16 250 lb 9.6 oz (113.7 kg)  05/10/15 264 lb 11.2 oz (120.1 kg)    PHYSICAL EXAM BP 117/70 (BP Location: Left Wrist, Patient Position: Sitting, Cuff Size: Normal)   Pulse 82   Ht 5\' 2"  (1.575 m)   Wt 259 lb (117.5 kg)   SpO2 97%   BMI 47.37 kg/m  Physical Exam  Constitutional: She is oriented to person, place, and time. She appears well-developed and well-nourished. No distress.  HENT:  Head: Normocephalic and atraumatic.  Mouth/Throat: Oropharynx is clear and  moist.  Eyes: Right eye exhibits no discharge. Left eye exhibits no discharge. No scleral icterus.  Right eye is somewhat frosted over from her corneal transplant. Hematocrit cloudy with decreased abductor and adductor muscle control.  Neck: No hepatojugular reflux and no JVD present. Carotid bruit is not present.  Cardiovascular: Normal rate, normal heart sounds and intact distal pulses.  An irregularly irregular rhythm present. Exam reveals no gallop and no friction rub.   No murmur heard. Unable to palpate PMI  Pulmonary/Chest: Effort normal and breath sounds normal. No respiratory  distress. She has no wheezes.  Abdominal: Soft. Bowel sounds are normal. She exhibits no distension. There is no tenderness. There is no rebound.  Musculoskeletal: Normal range of motion. She exhibits edema (1-2+ left leg, 1+ right).  Neurological: She is alert and oriented to person, place, and time.  Skin: Skin is warm and dry. No rash noted.  Psychiatric: She has a normal mood and affect. Her behavior is normal. Judgment and thought content normal.  Very tearful and anxious today discussing her husband.  Nursing note and vitals reviewed.    Adult ECG Report  Rate: 82 ;  Rhythm: atrial fibrillation and Left axis deviation (-62) LBBB.;   Narrative Interpretation: Now in A. fib. Rate controlled  Other studies Reviewed: Additional studies/ records that were reviewed today include:  Recent Labs:   Lab Results  Component Value Date   CHOL 147 10/21/2013   HDL 83 10/21/2013   LDLCALC 50 10/21/2013   TRIG 69 10/21/2013   CHOLHDL 1.8 10/21/2013    ASSESSMENT / PLAN: Problem List Items Addressed This Visit    Acute myocardial infarction of anterior wall, subsequent episode of care ([place]) - Primary (Chronic)    Large defect on Myoview. No recurrent MI or heart failure symptoms however despite being in A. fib. She does have reduced EF but no real signs of significant heart failure decided edema. No PND or  orthopnea, think the edema is probably more stasis related. Continue carvedilol and lisinopril statin. On aspirin and ELIQUIS.      Relevant Orders   EKG 12-Lead (Completed)   CAD status post CABG (Chronic)    Stable with no active anginal symptoms. She actually had heart catheterization done in January 2016 which showed widely patent graftsNo active. Would probably need a follow-up evaluation in January February 2020 unless she has symptoms. On beta blocker and ACE inhibitor as well as statin. On aspirin and Plavix. She is on ELIQUIS.       Relevant Orders   EKG 12-Lead (Completed)   DOE (dyspnea on exertion) (Chronic)    Probably combination of diastolic dysfunction, A. fib intermittently as well as EF, deconditioning and COPD. Pretty much stable. Did encourage her to continue stay active and walk using a walker if necessary or cane. She can walk at a grocery store using a cart.      Edema of both legs (Chronic)    Chronic issue with her controlled with furosemide. I think a lot of the swelling is having now is related to her gout. I've been trying to talk her into wearing compression/support hose and she seems to "forget ".  Also talked about importance of elevating her feet. I think the edema is probably not related to heart failure, more consistent with venous stasis.      Essential hypertension (Chronic)    Well-controlled on current meds. Also monitored and adjusted by Dr. Florene Glen from Nephrology.      Hyperlipidemia with target LDL less than 70 (Chronic)    She is on Crestor labs are being monitored by PCP.  I had intended for her to increase her Crestor to 20 mg, but she remains on statin. As I had not had recent labs. I will defer to PCP.      ICM EF by Echo 20-25% Feb 2015, recently improved to 40-45% by echo 01/13/14 (Chronic)    Relatively euvolemic on exam. On stable dose of Lasix has been adjusted by her nephrologist. She has been going up and down  doses, now is  probably just taking it once a day.  On stable dose of ARB and ACE inhibitor.      PAF- doing MI and perioperative CABG; CHA2DS2-VASc Score 6, on Eliquis (Chronic)    Back in A. fib today, but asymptomatic. He probably goes and does not know. Rate is controlled on beta blocker. No bleeding issues on  ELIQUIS.      Relevant Orders   EKG 12-Lead (Completed)      Current medicines are reviewed at length with the patient today. (+/- concerns) n/a The following changes have been made: n/a  Patient Instructions  NO CHANGE WITH CURRENT MEDICATIONS      Your physician wants you to follow-up in [place].You will receive a reminder letter in the mail two months in advance. If you don't receive a letter, please call our office to schedule the follow-up appointment.    Studies Ordered:   Orders Placed This Encounter  Procedures  . EKG 12-Lead      Glenetta Hew, M.D., M.S. Interventional Cardiologist   Pager # [phone] Phone # [phone] [address] [place], [place] [address]

## 2017-04-23 NOTE — Patient Instructions (Signed)
NO CHANGE WITH CURRENT MEDICATIONS      Your physician wants you to follow-up in [place].You will receive a reminder letter in the mail two months in advance. If you don't receive a letter, please call our office to schedule the follow-up appointment.

## 2017-04-25 ENCOUNTER — Encounter: Payer: Self-pay | Admitting: Cardiology

## 2017-04-25 ENCOUNTER — Ambulatory Visit: Payer: Medicare HMO | Admitting: Cardiology

## 2017-04-25 NOTE — Assessment & Plan Note (Signed)
She is on Crestor labs are being monitored by PCP.  I had intended for her to increase her Crestor to 20 mg, but she remains on statin. As I had not had recent labs. I will defer to PCP.

## 2017-04-25 NOTE — Assessment & Plan Note (Signed)
Relatively euvolemic on exam. On stable dose of Lasix has been adjusted by her nephrologist. She has been going up and down doses, now is probably just taking it once a day.  On stable dose of ARB and ACE inhibitor.

## 2017-04-25 NOTE — Assessment & Plan Note (Signed)
Chronic issue with her controlled with furosemide. I think a lot of the swelling is having now is related to her gout. I've been trying to talk her into wearing compression/support hose and she seems to "forget ".  Also talked about importance of elevating her feet. I think the edema is probably not related to heart failure, more consistent with venous stasis.

## 2017-04-25 NOTE — Assessment & Plan Note (Signed)
Probably combination of diastolic dysfunction, A. fib intermittently as well as EF, deconditioning and COPD. Pretty much stable. Did encourage her to continue stay active and walk using a walker if necessary or cane. She can walk at a grocery store using a cart.

## 2017-04-25 NOTE — Assessment & Plan Note (Signed)
Back in A. fib today, but asymptomatic. He probably goes and does not know. Rate is controlled on beta blocker. No bleeding issues on  ELIQUIS.

## 2017-04-25 NOTE — Assessment & Plan Note (Signed)
Large defect on Myoview. No recurrent MI or heart failure symptoms however despite being in A. fib. She does have reduced EF but no real signs of significant heart failure decided edema. No PND or orthopnea, think the edema is probably more stasis related. Continue carvedilol and lisinopril statin. On aspirin and ELIQUIS.

## 2017-04-25 NOTE — Assessment & Plan Note (Signed)
Stable with no active anginal symptoms. She actually had heart catheterization done in January 2016 which showed widely patent graftsNo active. Would probably need a follow-up evaluation in January February 2020 unless she has symptoms. On beta blocker and ACE inhibitor as well as statin. On aspirin and Plavix. She is on ELIQUIS.

## 2017-04-25 NOTE — Assessment & Plan Note (Signed)
Well-controlled on current meds. Also monitored and adjusted by Dr. Florene Glen from Nephrology.

## 2017-05-08 DIAGNOSIS — Z23 Encounter for immunization: Secondary | ICD-10-CM | POA: Diagnosis not present

## 2017-05-16 DIAGNOSIS — H353111 Nonexudative age-related macular degeneration, right eye, early dry stage: Secondary | ICD-10-CM | POA: Diagnosis not present

## 2017-05-16 DIAGNOSIS — H1859 Other hereditary corneal dystrophies: Secondary | ICD-10-CM | POA: Diagnosis not present

## 2017-05-16 DIAGNOSIS — H35032 Hypertensive retinopathy, left eye: Secondary | ICD-10-CM | POA: Diagnosis not present

## 2017-05-16 DIAGNOSIS — H04123 Dry eye syndrome of bilateral lacrimal glands: Secondary | ICD-10-CM | POA: Diagnosis not present

## 2017-05-16 DIAGNOSIS — E119 Type 2 diabetes mellitus without complications: Secondary | ICD-10-CM | POA: Diagnosis not present

## 2017-05-16 DIAGNOSIS — H26492 Other secondary cataract, left eye: Secondary | ICD-10-CM | POA: Diagnosis not present

## 2017-05-16 DIAGNOSIS — Z961 Presence of intraocular lens: Secondary | ICD-10-CM | POA: Diagnosis not present

## 2017-05-21 ENCOUNTER — Other Ambulatory Visit: Payer: Self-pay | Admitting: Cardiology

## 2017-06-08 DIAGNOSIS — E1149 Type 2 diabetes mellitus with other diabetic neurological complication: Secondary | ICD-10-CM | POA: Diagnosis not present

## 2017-06-08 DIAGNOSIS — E7849 Other hyperlipidemia: Secondary | ICD-10-CM | POA: Diagnosis not present

## 2017-06-08 DIAGNOSIS — N390 Urinary tract infection, site not specified: Secondary | ICD-10-CM | POA: Diagnosis not present

## 2017-06-08 DIAGNOSIS — M109 Gout, unspecified: Secondary | ICD-10-CM | POA: Diagnosis not present

## 2017-06-08 DIAGNOSIS — E038 Other specified hypothyroidism: Secondary | ICD-10-CM | POA: Diagnosis not present

## 2017-06-15 DIAGNOSIS — Z7901 Long term (current) use of anticoagulants: Secondary | ICD-10-CM | POA: Diagnosis not present

## 2017-06-15 DIAGNOSIS — I5022 Chronic systolic (congestive) heart failure: Secondary | ICD-10-CM | POA: Diagnosis not present

## 2017-06-15 DIAGNOSIS — E7849 Other hyperlipidemia: Secondary | ICD-10-CM | POA: Diagnosis not present

## 2017-06-15 DIAGNOSIS — I25810 Atherosclerosis of coronary artery bypass graft(s) without angina pectoris: Secondary | ICD-10-CM | POA: Diagnosis not present

## 2017-06-15 DIAGNOSIS — I4891 Unspecified atrial fibrillation: Secondary | ICD-10-CM | POA: Diagnosis not present

## 2017-06-15 DIAGNOSIS — I10 Essential (primary) hypertension: Secondary | ICD-10-CM | POA: Diagnosis not present

## 2017-06-15 DIAGNOSIS — Z6841 Body Mass Index (BMI) 40.0 and over, adult: Secondary | ICD-10-CM | POA: Diagnosis not present

## 2017-06-15 DIAGNOSIS — E1149 Type 2 diabetes mellitus with other diabetic neurological complication: Secondary | ICD-10-CM | POA: Diagnosis not present

## 2017-06-15 DIAGNOSIS — Z0000 Encounter for general adult medical examination without abnormal findings: Secondary | ICD-10-CM | POA: Diagnosis not present

## 2017-06-22 ENCOUNTER — Other Ambulatory Visit: Payer: Self-pay | Admitting: Internal Medicine

## 2017-06-22 DIAGNOSIS — R921 Mammographic calcification found on diagnostic imaging of breast: Secondary | ICD-10-CM

## 2017-07-02 DIAGNOSIS — Z1212 Encounter for screening for malignant neoplasm of rectum: Secondary | ICD-10-CM | POA: Diagnosis not present

## 2017-07-06 DIAGNOSIS — Z6841 Body Mass Index (BMI) 40.0 and over, adult: Secondary | ICD-10-CM | POA: Diagnosis not present

## 2017-07-06 DIAGNOSIS — J441 Chronic obstructive pulmonary disease with (acute) exacerbation: Secondary | ICD-10-CM | POA: Diagnosis not present

## 2017-07-06 DIAGNOSIS — I5022 Chronic systolic (congestive) heart failure: Secondary | ICD-10-CM | POA: Diagnosis not present

## 2017-07-13 DIAGNOSIS — R69 Illness, unspecified: Secondary | ICD-10-CM | POA: Diagnosis not present

## 2017-08-16 ENCOUNTER — Ambulatory Visit
Admission: RE | Admit: 2017-08-16 | Discharge: 2017-08-16 | Disposition: A | Discharge status: Home / Self Care | Payer: Medicare HMO | Source: Ambulatory Visit | Attending: Internal Medicine | Admitting: Internal Medicine

## 2017-08-16 ENCOUNTER — Other Ambulatory Visit: Payer: Self-pay | Admitting: Internal Medicine

## 2017-08-16 DIAGNOSIS — R921 Mammographic calcification found on diagnostic imaging of breast: Secondary | ICD-10-CM

## 2017-08-16 DIAGNOSIS — N6489 Other specified disorders of breast: Secondary | ICD-10-CM | POA: Diagnosis not present

## 2017-09-28 ENCOUNTER — Encounter: Payer: Self-pay | Admitting: Pulmonary Disease

## 2017-09-28 ENCOUNTER — Ambulatory Visit: Payer: Medicare Other | Admitting: Pulmonary Disease

## 2017-09-28 ENCOUNTER — Ambulatory Visit (INDEPENDENT_AMBULATORY_CARE_PROVIDER_SITE_OTHER)
Admission: RE | Admit: 2017-09-28 | Discharge: 2017-09-28 | Disposition: A | Discharge status: Home / Self Care | Payer: Medicare Other | Source: Ambulatory Visit | Attending: Pulmonary Disease | Admitting: Pulmonary Disease

## 2017-09-28 VITALS — BP 144/78 | HR 119 | Ht 62.0 in | Wt 276.0 lb

## 2017-09-28 DIAGNOSIS — J449 Chronic obstructive pulmonary disease, unspecified: Secondary | ICD-10-CM | POA: Diagnosis not present

## 2017-09-28 DIAGNOSIS — R0602 Shortness of breath: Secondary | ICD-10-CM

## 2017-09-28 NOTE — Patient Instructions (Signed)
We will check your oxygen levels on exertion and determine if a portable concentrator is appropriate for you . Continue trelegy inhaler, DuoNebs and albuterol rescue inhaler We will check chest x-ray and pulmonary function test Continue supplemental oxygen for now Follow-up in 1-2 months.

## 2017-09-28 NOTE — Progress Notes (Signed)
Elaine [name]    [id number]    1940/09/03  Primary Care Physician:Shaw, Gwyndolyn Saxon, MD  Referring Physician: Marton Redwood, MD [address] [place], [place] [address]  Chief complaint: Consult for COPD  HPI: 77 year old with COPD, ischemic cardiomyopathy, atrial fibrillation on Eliquis, coronary artery disease, diabetes, chronic kidney disease.  She was diagnosed with COPD about 2 years ago.  She was previously maintained on Anoro inhaler, saw her primary care on September 14, 2017 with progressive increasing dyspnea.  She was started on supplemental oxygen and anoro changed to trelegy. Per office note she was treated with right lower lobe pneumonia with antibiotics.  Repeat chest x-ray in early January shows resolution of the pneumonia.  I do not have these images to review. Reports chronic dyspnea on exertion, cough with white mucus.  No fevers, chills, hemoptysis.   Pets: No pets Occupation: Retired Immunologist Exposures: No known exposures.  No mold, hot tub Smoking history: 20-pack-year smoking history.  Quit in 1995 Travel History: Not significant.  Outpatient Encounter Medications as of 09/28/2017  Medication Sig  . apixaban (ELIQUIS) 5 MG TABS tablet Take 5 mg by mouth 2 (two) times daily.  [name] aspirin EC 81 MG tablet Take 1 tablet (81 mg total) by mouth daily.  . carvedilol (COREG) 6.25 MG tablet TAKE ONE TABLET BY MOUTH TWICE DAILY WITH A MEAL  . Dulaglutide (TRULICITY) 6.29 UT/6.5YY SOPN Inject into the skin.  . ferrous sulfate 325 (65 FE) MG tablet TAKE ONE TABLET BY MOUTH ONCE DAILY WITH  BREAKFAST  . Fluticasone-Umeclidin-Vilant (TRELEGY ELLIPTA) 100-62.5-25 MCG/INH AEPB Inhale 1 puff into the lungs daily.  . furosemide (LASIX) 80 MG tablet Take 80 mg by mouth daily.   [name] ipratropium-albuterol (DUONEB) 0.5-2.5 (3) MG/3ML SOLN Take 3 mLs by nebulization every 4 (four) hours as needed (for shortness of breath).   [name] levothyroxine (SYNTHROID) 112 MCG tablet Take 1 tablet (112  mcg total) by mouth daily before breakfast.  . lisinopril (PRINIVIL,ZESTRIL) 2.5 MG tablet Take 2.5 mg by mouth daily.  . prednisoLONE acetate (PRED FORTE) 1 % ophthalmic suspension Place 1 drop into the right eye daily.  [name] PROAIR HFA 108 (90 BASE) MCG/ACT inhaler Inhale 1 puff into the lungs daily.  . rosuvastatin (CRESTOR) 20 MG tablet Take 10 mg by mouth at bedtime.    No facility-administered encounter medications on file as of 09/28/2017.     Allergies as of 09/28/2017 - Review Complete 09/28/2017  Allergen Reaction Noted  . Allopurinol Hives 02/02/2014  . Tape Rash and Other (See Comments) 12/11/2011    Past Medical History:  Diagnosis Date  . Acute myocardial infarction of anterior wall (HCC) 10/20/2013   Afib, LBBB  . Acute pulmonary edema with congestive heart failure ([place]) 10/20/2013   Resolved  . Arthritis   . Atrial fibrillation ([place]) 10/20/2013   On Warfarin  . CAD, multiple vessel 10/20/2013   LM, LAD & RCA --> CABG; MYOVIEW 12/'15: Intermediate Risk would large anterior, anteroapical segment the apex possible infarct and peri-infarct ischemia  . CHF (congestive heart failure) (HCC)    EF 40-45%.  . Chronic kidney disease   . COPD (chronic obstructive pulmonary disease) ([place])   . Diabetic neuropathy ([place])   . Heart murmur    Likely related to aortic sclerosis  . Hyperlipidemia   . Hypertension   . Hypothyroidism   . Ischemic cardiomyopathy - Notable Improvement in EF post CABG 10/20/2013   Echo 2/23: EF  20-25%; mild LVH. anteroseptal Akinesis; mid-apical anterior, inferior & inferoseptal + apical-lateral akinesis; G 1 DDysfxn.; Mild-Mod LA dil; mild MR; Mod PHTN;; F/u Echo 12/2013: EF 40-45%, septal & apical HK, mild LVH; Mod LA dilation  . Left bundle branch block (LBBB) on electrocardiogram   . Mild mitral regurgitation by prior echocardiogram    Mild-Mod MR on Echo  . Morbid obesity ([place])   . OSTEOARTHRITIS 08/06/2006  . S/P CABG x 3 10/23/2013   LIMA to LAD, SVG  to OM2, SVG to RPLB, EVH via right thigh  . Type II diabetes mellitus with complication ([place]) 24/40/1027   off rx since heart attack 12/15-dr told could stay off if keep cbg under 150  . UTI (lower urinary tract infection) 09/29/14   ongoing now. started rx 09/28/14    Past Surgical History:  Procedure Laterality Date  . ABDOMINAL HYSTERECTOMY    . BREAST LUMPECTOMY W/ NEEDLE LOCALIZATION Right 10/06/2014   Dr Georgette Dover  . BREAST LUMPECTOMY WITH NEEDLE LOCALIZATION Right 10/06/2014   Procedure: RIGHT BREAST LUMPECTOMY WITH NEEDLE LOCALIZATION;  Surgeon: Donnie Mesa, MD;  Location: [facility];  Service: General;  Laterality: Right;  . CLIPPING OF ATRIAL APPENDAGE N/A 10/23/2013   Procedure: CLIPPING OF ATRIAL APPENDAGE;  Surgeon: Rexene Alberts, MD;  Location: [facility];  Service: Open Heart Surgery;  Laterality: N/A;  . 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  . CORONARY ARTERY BYPASS GRAFT N/A 10/23/2013   Procedure: CORONARY ARTERY BYPASS GRAFTING (CABG);  Surgeon: Rexene Alberts, MD;  Location: [facility];  Service: Open Heart Surgery: LIMA-LAD, SVG-OM2, SVG-RPL  . INTRAOPERATIVE TRANSESOPHAGEAL ECHOCARDIOGRAM N/A 10/23/2013   Procedure: INTRAOPERATIVE TRANSESOPHAGEAL ECHOCARDIOGRAM;  Surgeon: Rexene Alberts, MD;  Location: [facility];  Service: Open Heart Surgery;  Laterality: N/A;  . LEFT HEART CATHETERIZATION WITH CORONARY ANGIOGRAM N/A 10/20/2013   Procedure: LEFT HEART CATHETERIZATION WITH CORONARY ANGIOGRAM;  Surgeon: Leonie Man, MD;  Location[facility] CATH LAB;  Service: Cardiovascular: Distal LM ~70%, LAD - ostial 70%, prox 80, 95 & 95%; RCA prox 70%, mid 80%; minimal Cx disease  . LEFT HEART CATHETERIZATION WITH CORONARY/GRAFT ANGIOGRAM N/A 09/08/2014   Procedure: LEFT HEART CATHETERIZATION WITH Beatrix Fetters;  Surgeon: Leonie Man, MD;  Location: [facility] CATH LAB: Indication: Abnormal Myoview. Occluded LAD after 70 and 80% left main. Diffuse RCA disease. Widely patent grafts to moderately diseased  artery vessels. Mild-moderate LV dysfunction and chronic A. fib.  [name] NM MYOVIEW LTD  08/14/2014   Lexi scan: INTERMEDIATE RISK - large, severe intensity perfusion defect in anterior wall, apex and inferior apex that is partly reversible. This suggests either scar or possible hibernating myocardium. Nondeviated.-> Likely scar. No change in coronary anatomy with patent grafts.  [name] TOTAL HIP ARTHROPLASTY  2010, 2011   right 2010, left 2011  . TRANSTHORACIC ECHOCARDIOGRAM  12/2013   Mildly dilated left ventricle with mild to moderately reduced function. EF 40-45% (notable improvement from February 2015). Septal and apical hypokinesis. Mild LA dilation.    Family History  Problem Relation Age of Onset  . Colon cancer Neg Hx   . Esophageal cancer Neg Hx   . Rectal cancer Neg Hx   . Stomach cancer Neg Hx     Social History   Socioeconomic History  . Marital status: Married    Spouse name: Not on file  . Number of children: Not on file  . Years of education: Not on file  . Highest education level: Not on file  Social Needs  . Financial resource strain: Not on file  . Food insecurity - worry: Not on file  . Food insecurity - inability: Not on file  . Transportation needs - medical: Not on file  . Transportation needs - non-medical: Not on file  Occupational History  . Occupation: retired  Tobacco Use  . Smoking status: Former Smoker    Packs/day: 0.50    Years: 39.00    Pack years: 19.50    Types: Cigarettes    Last attempt to quit: 08/28/1993    Years since quitting: 24.1  . Smokeless tobacco: Never Used  Substance and Sexual Activity  . Alcohol use: No    Comment: quit 95  . Drug use: No  . Sexual activity: Not on file  Other Topics Concern  . Not on file  Social History Narrative   Lives with husband and son in [place].  She is currently retired.   Former smoker quit in 1995.    Review of systems: Review of Systems  Constitutional: Negative for fever and chills.    HENT: Negative.   Eyes: Negative for blurred vision.  Respiratory: as per HPI  Cardiovascular: Negative for chest pain and palpitations.  Gastrointestinal: Negative for vomiting, diarrhea, blood per rectum. Genitourinary: Negative for dysuria, urgency, frequency and hematuria.  Musculoskeletal: Negative for myalgias, back pain and joint pain.  Skin: Negative for itching and rash.  Neurological: Negative for dizziness, tremors, focal weakness, seizures and loss of consciousness.  Endo/Heme/Allergies: Negative for environmental allergies.  Psychiatric/Behavioral: Negative for depression, suicidal ideas and hallucinations.  All other systems reviewed and are negative.  Physical Exam: Blood pressure (!) 144/78, pulse (!) 119, height 5\' 2"  (1.575 m), weight 276 lb (125.2 kg), SpO2 98 %. Gen:      No acute distress HEENT:  EOMI, sclera anicteric Neck:     No masses; no thyromegaly Lungs:    Clear to auscultation bilaterally; normal respiratory effort CV:         Regular rate and rhythm; no murmurs Abd:      + bowel sounds; soft, non-tender; no palpable masses, no distension Ext:    2+ edema; adequate peripheral perfusion Skin:      Warm and dry; no rash Neuro: alert and oriented x 3 Psych: normal mood and affect  Data Reviewed: PFTs 04/03/14 FVC 1.93 [95%], FEV1 1.43 [90%], F/F 74, TLC 94%, DLCO 62% Mild obstruction with response to bronchodilator response.  Moderate diffusion defect.  Chest x-ray 07/24/16-hyperinflation, cardiomegaly.  I reviewed the images personally  Assessment:  Consult for dyspnea, COPD Suspect her dyspnea is multifactorial from COPD, chronic heart failure, kidney disease.  PFTS reviewed with minimal obstructive changes in 2015.  We will reevaluate this by pulmonary function test Continue on trelegy.  She is qualified for oxygen and portable concentrator.  Recent right lower lobe pneumonia Get chest x-ray today to reevaluate.  Plan/Recommendations: - Continue  trelegy, duonebs, supplemental oxygen.  Order portable concentrator - Pulmonary function test, CXR.  Marshell Garfinkel MD [facility] Pager [phone] 09/28/2017, 11:16 AM  CC: Marton Redwood, MD

## 2017-10-08 ENCOUNTER — Emergency Department (HOSPITAL_COMMUNITY): Payer: Medicare Other

## 2017-10-08 ENCOUNTER — Emergency Department (HOSPITAL_COMMUNITY)
Admission: EM | Admit: 2017-10-08 | Discharge: 2017-10-08 | Disposition: A | Discharge status: Home / Self Care | Payer: Medicare Other | Attending: Emergency Medicine | Admitting: Emergency Medicine

## 2017-10-08 ENCOUNTER — Encounter (HOSPITAL_COMMUNITY): Payer: Self-pay

## 2017-10-08 ENCOUNTER — Other Ambulatory Visit: Payer: Self-pay

## 2017-10-08 DIAGNOSIS — Z7901 Long term (current) use of anticoagulants: Secondary | ICD-10-CM | POA: Diagnosis not present

## 2017-10-08 DIAGNOSIS — E1122 Type 2 diabetes mellitus with diabetic chronic kidney disease: Secondary | ICD-10-CM | POA: Diagnosis not present

## 2017-10-08 DIAGNOSIS — I509 Heart failure, unspecified: Secondary | ICD-10-CM | POA: Insufficient documentation

## 2017-10-08 DIAGNOSIS — Z79899 Other long term (current) drug therapy: Secondary | ICD-10-CM | POA: Diagnosis not present

## 2017-10-08 DIAGNOSIS — E039 Hypothyroidism, unspecified: Secondary | ICD-10-CM | POA: Insufficient documentation

## 2017-10-08 DIAGNOSIS — N183 Chronic kidney disease, stage 3 (moderate): Secondary | ICD-10-CM | POA: Diagnosis not present

## 2017-10-08 DIAGNOSIS — M25551 Pain in right hip: Secondary | ICD-10-CM | POA: Diagnosis present

## 2017-10-08 DIAGNOSIS — I25810 Atherosclerosis of coronary artery bypass graft(s) without angina pectoris: Secondary | ICD-10-CM | POA: Diagnosis not present

## 2017-10-08 DIAGNOSIS — E1140 Type 2 diabetes mellitus with diabetic neuropathy, unspecified: Secondary | ICD-10-CM | POA: Diagnosis not present

## 2017-10-08 DIAGNOSIS — Y999 Unspecified external cause status: Secondary | ICD-10-CM | POA: Insufficient documentation

## 2017-10-08 DIAGNOSIS — I252 Old myocardial infarction: Secondary | ICD-10-CM | POA: Diagnosis not present

## 2017-10-08 DIAGNOSIS — W010XXA Fall on same level from slipping, tripping and stumbling without subsequent striking against object, initial encounter: Secondary | ICD-10-CM | POA: Diagnosis not present

## 2017-10-08 DIAGNOSIS — Y9301 Activity, walking, marching and hiking: Secondary | ICD-10-CM | POA: Insufficient documentation

## 2017-10-08 DIAGNOSIS — W19XXXA Unspecified fall, initial encounter: Secondary | ICD-10-CM

## 2017-10-08 DIAGNOSIS — I130 Hypertensive heart and chronic kidney disease with heart failure and stage 1 through stage 4 chronic kidney disease, or unspecified chronic kidney disease: Secondary | ICD-10-CM | POA: Diagnosis not present

## 2017-10-08 LAB — CBC WITH DIFFERENTIAL/PLATELET
Basophils Absolute: 0 10*3/uL (ref 0.0–0.1)
Basophils Relative: 0 %
Eosinophils Absolute: 0.1 10*3/uL (ref 0.0–0.7)
Eosinophils Relative: 2 %
HEMATOCRIT: 33.6 % — AB (ref 36.0–46.0)
Hemoglobin: 10.8 g/dL — ABNORMAL LOW (ref 12.0–15.0)
LYMPHS PCT: 24 %
Lymphs Abs: 1.2 10*3/uL (ref 0.7–4.0)
MCH: 28.2 pg (ref 26.0–34.0)
MCHC: 32.1 g/dL (ref 30.0–36.0)
MCV: 87.7 fL (ref 78.0–100.0)
MONO ABS: 0.4 10*3/uL (ref 0.1–1.0)
Monocytes Relative: 8 %
NEUTROS ABS: 3.4 10*3/uL (ref 1.7–7.7)
Neutrophils Relative %: 66 %
Platelets: 164 10*3/uL (ref 150–400)
RBC: 3.83 MIL/uL — ABNORMAL LOW (ref 3.87–5.11)
RDW: 16.6 % — AB (ref 11.5–15.5)
WBC: 5.2 10*3/uL (ref 4.0–10.5)

## 2017-10-08 LAB — BASIC METABOLIC PANEL
ANION GAP: 14 (ref 5–15)
BUN: 32 mg/dL — ABNORMAL HIGH (ref 6–20)
CO2: 31 mmol/L (ref 22–32)
Calcium: 10 mg/dL (ref 8.9–10.3)
Chloride: 97 mmol/L — ABNORMAL LOW (ref 101–111)
Creatinine, Ser: 1.65 mg/dL — ABNORMAL HIGH (ref 0.44–1.00)
GFR calc Af Amer: 34 mL/min — ABNORMAL LOW (ref >60)
GFR calc non Af Amer: 29 mL/min — ABNORMAL LOW (ref >60)
GLUCOSE: 115 mg/dL — AB (ref 65–99)
Potassium: 4 mmol/L (ref 3.5–5.1)
Sodium: 142 mmol/L (ref 135–145)

## 2017-10-08 MED ORDER — TRAMADOL HCL 50 MG PO TABS: 50.0000 mg | ORAL_TABLET | Freq: Four times a day (QID) | ORAL | 0 refills | Status: DC | PRN

## 2017-10-08 MED ORDER — LIDOCAINE 5 % EX PTCH: 1.0000 | MEDICATED_PATCH | CUTANEOUS | Status: DC

## 2017-10-08 MED ORDER — HYDROCODONE-ACETAMINOPHEN 5-325 MG PO TABS
1.0000 | ORAL_TABLET | Freq: Once | ORAL | Status: AC
  Administered 2017-10-08: 1 via ORAL
  Filled 2017-10-08: qty 1

## 2017-10-08 MED ORDER — LIDOCAINE 5 % EX PTCH: 1.0000 | MEDICATED_PATCH | CUTANEOUS | 0 refills | Status: DC

## 2017-10-08 NOTE — ED Notes (Signed)
Patient transported to CT 

## 2017-10-08 NOTE — ED Notes (Signed)
Pt transferred to bed with assist x 2

## 2017-10-08 NOTE — ED Provider Notes (Signed)
[facility] EMERGENCY DEPARTMENT Provider Note   CSN: [id number] Arrival date & time: 10/08/17  1024     History   Chief Complaint Chief Complaint  Patient presents with  . Fall    HPI Elaine [name] is a 77 y.o. female with a history of COPD, CHF, A-fib on Eliquis, CKD, HLD, HTN who presents to the ED for fall that occurred 2 days ago. Patient lives at home with husband and receives Environmental consultant from son. She notes that she normally walks with walker or cane.  Was ambulating at home 2 days ago when she tripped over her night gown, falling onto her right hip (notes previous hip replacement in 2011).  She denies head trauma or loss of consciousness.  Patient was assisted up by son and has been able to ambulate with difficulty and pain using a walker since.  She is presenting today because she is worried about fracture.  She denies any new weakness, numbness or tingling.  Denies bowel or bladder incontinence.  HPI  Past Medical History:  Diagnosis Date  . Acute myocardial infarction of anterior wall (HCC) 10/20/2013   Afib, LBBB  . Acute pulmonary edema with congestive heart failure ([place]) 10/20/2013   Resolved  . Arthritis   . Atrial fibrillation ([place]) 10/20/2013   On Warfarin  . CAD, multiple vessel 10/20/2013   LM, LAD & RCA --> CABG; MYOVIEW 12/'15: Intermediate Risk would large anterior, anteroapical segment the apex possible infarct and peri-infarct ischemia  . CHF (congestive heart failure) (HCC)    EF 40-45%.  . Chronic kidney disease   . COPD (chronic obstructive pulmonary disease) ([place])   . Diabetic neuropathy ([place])   . Heart murmur    Likely related to aortic sclerosis  . Hyperlipidemia   . Hypertension   . Hypothyroidism   . Ischemic cardiomyopathy - Notable Improvement in EF post CABG 10/20/2013   Echo 2/23: EF 20-25%; mild LVH. anteroseptal Akinesis; mid-apical anterior, inferior & inferoseptal + apical-lateral akinesis; G 1 DDysfxn.; Mild-Mod LA dil;  mild MR; Mod PHTN;; F/u Echo 12/2013: EF 40-45%, septal & apical HK, mild LVH; Mod LA dilation  . Left bundle branch block (LBBB) on electrocardiogram   . Mild mitral regurgitation by prior echocardiogram    Mild-Mod MR on Echo  . Morbid obesity ([place])   . OSTEOARTHRITIS 08/06/2006  . S/P CABG x 3 10/23/2013   LIMA to LAD, SVG to OM2, SVG to RPLB, EVH via right thigh  . Type II diabetes mellitus with complication ([place]) 50/04/3817   off rx since heart attack 12/15-dr told could stay off if keep cbg under 150  . UTI (lower urinary tract infection) 09/29/14   ongoing now. started rx 09/28/14    Patient Active Problem List   Diagnosis Date Noted  . Edema of both legs 05/10/2015  . Mass of right breast on mammogram 10/06/2014  . Abnormal nuclear stress test 08/19/2014  . DOE (dyspnea on exertion) 08/19/2014  . Severe obesity (BMI >= 40) ([place]) 04/19/2014  . CAD status post CABG 04/19/2014  . Metabolic acidosis 29/93/7169    Class: Acute  . Acute on chronic renal insufficiency 02/02/2014  . Near syncope 02/02/2014  . Hypotension 02/02/2014  . Rash 02/02/2014  . Acute kidney failure ([place]) 02/02/2014    Class: Acute  . Pruritus 02/02/2014    Class: Chronic  . SOB (shortness of breath) 01/07/2014  . Coumadin Rx post CABG 11/17/2013  . Physical deconditioning 11/05/2013  . S/P  CABG x 3- 10/23/13 10/23/2013  . PAF- doing MI and perioperative CABG; CHA2DS2-VASc Score 6, on Eliquis 10/20/2013  . Left bundle branch block (LBBB) on electrocardiogram 10/20/2013  . Left main coronary artery disease 10/20/2013    Class: Hospitalized for  . Acute myocardial infarction of anterior wall, subsequent episode of care ([place]) 10/20/2013  . ICM EF by Echo 20-25% Feb 2015, recently improved to 40-45% by echo 01/13/14 10/20/2013  . Morbid obesity- BMI 48   . Diabetic neuropathy ([place])   . Chronic kidney disease, stage III (moderate) (HCC)   . Obesity 12/20/2006  . Type II diabetes mellitus with complication  ([place]) 57/26/2035  . Hyperlipidemia with target LDL less than 70 08/06/2006  . Essential hypertension 08/06/2006  . OSTEOARTHRITIS 08/06/2006    Past Surgical History:  Procedure Laterality Date  . ABDOMINAL HYSTERECTOMY    . BREAST LUMPECTOMY W/ NEEDLE LOCALIZATION Right 10/06/2014   Dr Georgette Dover  . BREAST LUMPECTOMY WITH NEEDLE LOCALIZATION Right 10/06/2014   Procedure: RIGHT BREAST LUMPECTOMY WITH NEEDLE LOCALIZATION;  Surgeon: Donnie Mesa, MD;  Location: [facility];  Service: General;  Laterality: Right;  . CLIPPING OF ATRIAL APPENDAGE N/A 10/23/2013   Procedure: CLIPPING OF ATRIAL APPENDAGE;  Surgeon: Rexene Alberts, MD;  Location: [facility];  Service: Open Heart Surgery;  Laterality: N/A;  . 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  . CORONARY ARTERY BYPASS GRAFT N/A 10/23/2013   Procedure: CORONARY ARTERY BYPASS GRAFTING (CABG);  Surgeon: Rexene Alberts, MD;  Location: [facility];  Service: Open Heart Surgery: LIMA-LAD, SVG-OM2, SVG-RPL  . INTRAOPERATIVE TRANSESOPHAGEAL ECHOCARDIOGRAM N/A 10/23/2013   Procedure: INTRAOPERATIVE TRANSESOPHAGEAL ECHOCARDIOGRAM;  Surgeon: Rexene Alberts, MD;  Location: [facility];  Service: Open Heart Surgery;  Laterality: N/A;  . LEFT HEART CATHETERIZATION WITH CORONARY ANGIOGRAM N/A 10/20/2013   Procedure: LEFT HEART CATHETERIZATION WITH CORONARY ANGIOGRAM;  Surgeon: Leonie Man, MD;  Location: [facility] CATH LAB;  Service: Cardiovascular: Distal LM ~70%, LAD - ostial 70%, prox 80, 95 & 95%; RCA prox 70%, mid 80%; minimal Cx disease  . LEFT HEART CATHETERIZATION WITH CORONARY/GRAFT ANGIOGRAM N/A 09/08/2014   Procedure: LEFT HEART CATHETERIZATION WITH Beatrix Fetters;  Surgeon: Leonie Man, MD;  Location: [facility] CATH LAB: Indication: Abnormal Myoview. Occluded LAD after 70 and 80% left main. Diffuse RCA disease. Widely patent grafts to moderately diseased artery vessels. Mild-moderate LV dysfunction and chronic A. fib.  [name] NM MYOVIEW LTD  08/14/2014   Lexi scan: INTERMEDIATE  RISK - large, severe intensity perfusion defect in anterior wall, apex and inferior apex that is partly reversible. This suggests either scar or possible hibernating myocardium. Nondeviated.-> Likely scar. No change in coronary anatomy with patent grafts.  [name] TOTAL HIP ARTHROPLASTY  2010, 2011   right 2010, left 2011  . TRANSTHORACIC ECHOCARDIOGRAM  12/2013   Mildly dilated left ventricle with mild to moderately reduced function. EF 40-45% (notable improvement from February 2015). Septal and apical hypokinesis. Mild LA dilation.    OB History    No data available       Home Medications    Prior to Admission medications   Medication Sig Start Date End Date Taking? Authorizing Provider  apixaban (ELIQUIS) 5 MG TABS tablet Take 5 mg by mouth 2 (two) times daily.    [provider]  aspirin EC 81 MG tablet Take 1 tablet (81 mg total) by mouth daily. 11/13/13   Angiulli, Lavon Paganini, PA-C  carvedilol (COREG) 6.25 MG tablet TAKE ONE TABLET BY  MOUTH TWICE DAILY WITH A MEAL 05/22/17   Leonie Man, MD  Dulaglutide (TRULICITY) 7.86 VE/7.2CN SOPN Inject into the skin.    [provider]  ferrous sulfate 325 (65 FE) MG tablet TAKE ONE TABLET BY MOUTH ONCE DAILY WITH  BREAKFAST 03/22/15   Leonie Man, MD  Fluticasone-Umeclidin-Vilant (TRELEGY ELLIPTA) 100-62.5-25 MCG/INH AEPB Inhale 1 puff into the lungs daily.    [provider]  furosemide (LASIX) 80 MG tablet Take 80 mg by mouth daily.  08/06/14   [provider]  ipratropium-albuterol (DUONEB) 0.5-2.5 (3) MG/3ML SOLN Take 3 mLs by nebulization every 4 (four) hours as needed (for shortness of breath).  04/13/14   [provider]  levothyroxine (SYNTHROID) 112 MCG tablet Take 1 tablet (112 mcg total) by mouth daily before breakfast. 02/05/14   Leanna Battles, MD  lisinopril (PRINIVIL,ZESTRIL) 2.5 MG tablet Take 2.5 mg by mouth daily. 08/06/14   [provider]  prednisoLONE acetate (PRED FORTE)  1 % ophthalmic suspension Place 1 drop into the right eye daily.    [provider]  PROAIR HFA 108 (90 BASE) MCG/ACT inhaler Inhale 1 puff into the lungs daily. 02/06/15   [provider]  rosuvastatin (CRESTOR) 20 MG tablet Take 10 mg by mouth at bedtime.     [provider]    Family History Family History  Problem Relation Age of Onset  . Colon cancer Neg Hx   . Esophageal cancer Neg Hx   . Rectal cancer Neg Hx   . Stomach cancer Neg Hx     Social History Social History   Tobacco Use  . Smoking status: Former Smoker    Packs/day: 0.50    Years: 39.00    Pack years: 19.50    Types: Cigarettes    Last attempt to quit: 08/28/1993    Years since quitting: 24.1  . Smokeless tobacco: Never Used  Substance Use Topics  . Alcohol use: No    Comment: quit 95  . Drug use: No     Allergies   Allopurinol and Tape   Review of Systems Review of Systems  All other systems reviewed and are negative.    Physical Exam Updated Vital Signs BP 108/61   Pulse 95   Temp 98.4 F (36.9 C) (Oral)   Resp (!) 22   Ht 5\' 2"  (1.575 m)   Wt 125.2 kg (276 lb)   SpO2 90%   BMI 50.48 kg/m   Physical Exam  Constitutional: She appears well-developed and well-nourished.  Overweight, elderly female in no acute distress.  HENT:  Head: Normocephalic and atraumatic.  Right Ear: External ear normal.  Left Ear: External ear normal.  Nose: Nose normal.  Mouth/Throat: Uvula is midline, oropharynx is clear and moist and mucous membranes are normal. No tonsillar exudate.  Eyes: Pupils are equal, round, and reactive to light. Right eye exhibits no discharge. Left eye exhibits no discharge. No scleral icterus.  Right eye blindness from previous surgery  Neck: Trachea normal. Neck supple. No spinous process tenderness present. No neck rigidity. Normal range of motion present.  Cardiovascular: Normal rate, regular rhythm and intact distal pulses.  No murmur  heard. Pulses:      Radial pulses are 2+ on the right side, and 2+ on the left side.       Dorsalis pedis pulses are 2+ on the right side, and 2+ on the left side.       Posterior tibial pulses are 2+  on the right side, and 2+ on the left side.  Pulmonary/Chest: Effort normal. She exhibits no tenderness.  Abdominal: Soft. Bowel sounds are normal. There is no tenderness. There is no rebound and no guarding.  Musculoskeletal: She exhibits no edema.       Lumbar back: Normal.  No erythema, joint swelling, ecchymosis of the right hip.  Legs are not shortened or externally rotated.  Tenderness palpation over the right hip.  Patient allows passive range of motion of the right hip.  Negative logroll test.  No sacral crepitus.  Normal range of motion of knee and ankle of the right side.  Lymphadenopathy:    She has no cervical adenopathy.  Neurological: She is alert.  Speech clear. Follows commands. No facial droop. Patient with blindness of the right eye. Left eye reactive to light. EOMI. Normal peripheral fields of left. CN III-XII intact. Moves all extremities 4 without ataxia. Coordination intact. Able and appropriate strength for age to upper and lower extremities bilaterally including grip strength, dorsiflexion/plantar flexion and hip flexion. Sensation to light touch intact bilaterally for upper and lower. Patellar deep tendon reflex 2+ and equal bilaterally. Normal finger to nose. No pronator drift.   Skin: Skin is warm, dry and intact. Capillary refill takes less than 2 seconds. No rash noted. She is not diaphoretic. No erythema.  Psychiatric: She has a normal mood and affect.  Nursing note and vitals reviewed.   ED Treatments / Results  Labs (all labs ordered are listed, but only abnormal results are displayed) Labs Reviewed - No data to display  EKG  EKG Interpretation None       Radiology Ct Hip Right Wo Contrast  Result Date: 10/08/2017 CLINICAL DATA:  Remote history of  right hip replacement. The patient suffered a fall 10/06/2017 with onset of right hip pain. Initial encounter. EXAM: CT OF THE RIGHT HIP WITHOUT CONTRAST TECHNIQUE: Multidetector CT imaging of the right hip was performed according to the standard protocol. Multiplanar CT image reconstructions were also generated. COMPARISON:  Plain films right hip today. FINDINGS: Bones/Joint/Cartilage Right hip replacement is identified. The device is located. No evidence of loosening or other hardware complication is seen. No fracture is identified. Ligaments Suboptimally assessed by CT. Muscles and Tendons There is some fatty atrophy the right gluteus medius and minimus. Musculature otherwise appears normal. Soft tissues There is partial visualization of infiltration of subcutaneous fat about the lateral aspect of the right upper leg likely due to contusion. IMPRESSION: Findings compatible with soft tissue contusion in the subcutaneous fat of the lateral right upper leg, partially visualized. Right hip replacement in place. No fracture or other acute bony abnormality Electronically Signed   By: Inge Rise M.D.   On: 10/08/2017 14:51   Dg Hip Unilat  With Pelvis 2-3 Views Right  Result Date: 10/08/2017 CLINICAL DATA:  RIGHT hip pain after tripping over house dress and falling, landing on RIGHT side, 2 days ago EXAM: DG HIP (WITH OR WITHOUT PELVIS) 2-3V RIGHT COMPARISON:  None FINDINGS: Diffuse osseous demineralization. BILATERAL hip prostheses. SI joints symmetric. No acute fracture, dislocation, or bone destruction. No periprosthetic lucency. Scattered atherosclerotic calcifications. IMPRESSION: Osseous demineralization and BILATERAL hip prostheses without acute bony abnormalities. Electronically Signed   By: Lavonia Dana M.D.   On: 10/08/2017 11:13    Procedures Procedures (including critical care time)  Medications Ordered in ED Medications  lidocaine (LIDODERM) 5 % 1 patch (not administered)   HYDROcodone-acetaminophen (NORCO/VICODIN) 5-325 MG per tablet 1 tablet (  1 tablet Oral Given 10/08/17 1424)     Initial Impression / Assessment and Plan / ED Course  I have reviewed the triage vital signs and the nursing notes.  Pertinent labs & imaging results that were available during my care of the patient were reviewed by me and considered in my medical decision making (see chart for details).     77 year old female with mechanical fall 2 days ago presenting with right hip pain.  She has had previous hip replacement in 2011.  Patient is on Eliquis but denies head trauma or loss of consciousness.  Neuro exam is reassuring without any focal neurologic deficits.  Patient is tender palpation over the right hip.  X-rays are negative for fracture but patient is with significant pain in the department.  Will order CT scan to evaluate for occult fracture.  Will give pain medication while in the department.  CT scan with contusion over the hip and negative for fracture.  Patient given pain medication and lidocaine patch in the department with significant improvement of her symptoms.  Patient has a cane and walker at home which she is ambulatory with.  She has help with son and husband.  Daughter is by bedside and also agrees to help.  She feels comfortable going home at this time.  Will provide short course of pain medication for breakthrough pain.  Advised Tylenol for pain control otherwise.  I advised the patient to follow-up with PCP and orthopedist this week. Specific return precautions discussed. Time was given for all questions to be answered. The patient verbalized understanding and agreement with plan. The patient appears safe for discharge home.  Patient case seen and discussed with Dr. Maryan Rued who is in agreement with plan.   Final Clinical Impressions(s) / ED Diagnoses   Final diagnoses:  Fall, initial encounter  Right hip pain    ED Discharge Orders        Ordered    traMADol  (ULTRAM) 50 MG tablet  Every 6 hours PRN     10/08/17 1543    lidocaine (LIDODERM) 5 %  Every 24 hours     10/08/17 1543       Lorelle Gibbs 10/08/17 1544    Blanchie Dessert, MD 10/09/17 [phone]

## 2017-10-08 NOTE — ED Notes (Signed)
Patient is resting with call bell in reach  

## 2017-10-08 NOTE — ED Triage Notes (Signed)
Patient complains of right hip pain after slipping over house dress and landing on right side, no loc. Pain with any ambulation to hip. Has taken no meds for same

## 2017-10-08 NOTE — Discharge Instructions (Signed)
You were seen here today for right hip pain. Your xray and CT were negative. Please apply lidocaine patches to the area as directed. You can also take 650mg  of tylenol as needed every 6 hours as needed for pain.    For breakthrough pain you may take Ultram. Do not drink alcohol drive or operate heavy machinery when taking. You are being provided a prescription for opiates (also known as narcotics) for pain control on an ?as needed? basis.  Opiates can be addictive and should only be used when absolutely necessary for pain control when other alternatives do not work.  We recommend you only use them for the recommended amount of time and only as prescribed.  Please do not take with other sedative medications or alcohol.  Please do not drive, operate machinery, or make important decisions while taking opiates.  Please note that these medications can be addictive and have high abuse potential.  Please keep these medications locked away from children, teenagers or any family members with history of substance abuse. Additionally, these medications may cause constipation - take over the counter stool softeners or add fiber to your diet to treat this (Metamucil, Psyllium Fiber, Colace, Miralax) Further refills will need to be obtained from your primary care doctor and will not be prescribed through the Emergency Department. You will test positive on most drug tests while taking this medication.   Please follow up with your PCP this week. For continued pain, please see your orthopedist (I have provided you with referral if needed). If you develop worsening or new concerning symptoms you can return to the emergency department for re-evaluation.

## 2017-10-08 NOTE — ED Notes (Signed)
Patient transported to X-ray. Rad tech will transport pt too treatment room after xray.

## 2017-10-22 ENCOUNTER — Ambulatory Visit: Payer: Medicare Other | Admitting: Cardiology

## 2017-10-22 ENCOUNTER — Encounter: Payer: Self-pay | Admitting: Cardiology

## 2017-10-22 VITALS — BP 108/64 | HR 111 | Ht 62.0 in | Wt 260.0 lb

## 2017-10-22 DIAGNOSIS — I447 Left bundle-branch block, unspecified: Secondary | ICD-10-CM | POA: Diagnosis not present

## 2017-10-22 DIAGNOSIS — I255 Ischemic cardiomyopathy: Secondary | ICD-10-CM

## 2017-10-22 DIAGNOSIS — I2510 Atherosclerotic heart disease of native coronary artery without angina pectoris: Secondary | ICD-10-CM | POA: Diagnosis not present

## 2017-10-22 DIAGNOSIS — I10 Essential (primary) hypertension: Secondary | ICD-10-CM | POA: Diagnosis not present

## 2017-10-22 DIAGNOSIS — E785 Hyperlipidemia, unspecified: Secondary | ICD-10-CM | POA: Diagnosis not present

## 2017-10-22 DIAGNOSIS — R600 Localized edema: Secondary | ICD-10-CM

## 2017-10-22 DIAGNOSIS — M79604 Pain in right leg: Secondary | ICD-10-CM | POA: Insufficient documentation

## 2017-10-22 DIAGNOSIS — I252 Old myocardial infarction: Secondary | ICD-10-CM | POA: Diagnosis not present

## 2017-10-22 DIAGNOSIS — M5442 Lumbago with sciatica, left side: Secondary | ICD-10-CM | POA: Insufficient documentation

## 2017-10-22 DIAGNOSIS — I5042 Chronic combined systolic (congestive) and diastolic (congestive) heart failure: Secondary | ICD-10-CM

## 2017-10-22 DIAGNOSIS — Z951 Presence of aortocoronary bypass graft: Secondary | ICD-10-CM | POA: Diagnosis not present

## 2017-10-22 DIAGNOSIS — I480 Paroxysmal atrial fibrillation: Secondary | ICD-10-CM

## 2017-10-22 DIAGNOSIS — M79605 Pain in left leg: Secondary | ICD-10-CM

## 2017-10-22 NOTE — Assessment & Plan Note (Signed)
Now seems like may be more permanent A. fib.  Not very symptomatic.  Given her lack of symptoms and lack of significant activity, unless she has worsening heart failure I would not choose to try to get her back out of A. fib.  My suspicion is that she she will go right back into it without antiarrhythmic.  At this point would like to avoid an antiarrhythmic. No bleeding issues with Xarelto. Rate controlled with carvedilol.  The rate on vitals is not accurate, the rate on EKG is more accurate

## 2017-10-22 NOTE — Assessment & Plan Note (Signed)
Currently well controlled.  Concern is that she does get lightheaded and dizzy near the end of day.  My recommendation would be to switch the lisinopril dose to evening and have the option of holding it if she feels lightheaded and dizzy.  Otherwise continue current dose of carvedilol.

## 2017-10-22 NOTE — Assessment & Plan Note (Signed)
Chronic issue controlled with furosemide.  I suspect there is at least a component of combined heart failure associated. Target dry weight is right about what it is today 260 pounds.  Continue sliding scale Lasix per recommendations below  Continue support stockings

## 2017-10-22 NOTE — Assessment & Plan Note (Signed)
Status post CABG.  Follow-up Myoview was abnormal but grafts were patent on cath.  Remains on beta-blocker, ACE-I and statin along with low-dose aspirin.

## 2017-10-22 NOTE — Assessment & Plan Note (Addendum)
Chronic CHF with recent exacerbation.  This was treated aggressively with increased oral diuretic.  She is still on 80 twice daily and I think could probably wean down to 80 mg once a day and use additional dose on as-needed basis.  We discussed the as needed plan.  Continue stable dose of carvedilol and lisinopril

## 2017-10-22 NOTE — Assessment & Plan Note (Signed)
Despite being what appeared to be anterior infarct, she had multivessel disease.  There is a large fixed defect and Myoview with minimal reversibility.  Patent grafts.  Mildly reduced EF but no active heart failure symptoms.

## 2017-10-22 NOTE — Assessment & Plan Note (Signed)
Notably improved EF on echo, but still continues to have heart failure.  Her Lasix had been adjusted by her nephrologist, but I think at this point she is pre-much back to her dry weight and we can reduce to once daily with the option to use a second dose as needed.

## 2017-10-22 NOTE — Progress Notes (Signed)
PCP: Marton Redwood, MD  Clinic Note: Chief Complaint  Patient presents with  . Follow-up    Currently feeling better, recent mild exacerbation with 14-16 pound weight gain -now resolved on increased dose of Lasix  . Atrial Fibrillation  . Congestive Heart Failure    Combined systolic and diastolic heart failure  . Coronary Artery Disease    History of CABG    HPI: Elaine [name] [facility]) is a 77 y.o. female with a PMH below who presents today for Annual follow-up of CAD. She also notes some lower extremity swelling. MI February 2015 - while in A. fib RVR and LBBB. -> ? STEMI --> cardiac cath revealed multivessel CAD with high-grade proximal LAD and moderate left main disease referred for urgent CABG along with left atrial appendage clipping.   H/o CAD-CABGw/ LAA clipping, (09/2013)  Ischemic Cardiomyopathy(EF improved from  20-25% up to 40 and 45%) -   Afib/flutter & - last documented A. Fib RVR was in 2015.  Last Myoview December 2015 and echocardiogram May 2015. Most recent cath was in January 2016 showing patent grafts.  Elaine [name] was last seen on 04/26/2017 when she noted exertional dyspnea that was relatively stable.  Recent Hospitalizations:   Fall Feb 11 - ER visit - no fractures; lots of bruising (fell - tripped over house dress) -- noted to have had significant wgt gain ~14-16 lb up from normal --> increased lasix to 80 mg BID - wgt now back to baseline.  Studies Personally Reviewed - (if available, images/films reviewed: From Epic Chart or Care Everywhere)  None  Interval History: "Elaine [name]" presents here today noting that she is actually starting to feel more back to her baseline.  Her leg swelling is notably improved.  She had her fall recently, because she was sleeping on a recliner in order to elevate her feet.  When she woke up she did not realize where she was and she tripped over her night shirt.  What this uncovered was is that her weight was up 14-16 pounds from  increased volume.  Her PCP had her double up her Lasix to 80 mg twice daily and she is now back down to her regular dry weight.  Her breathing is notably improved and her edema has improved.  She still wears support stockings, but denies any recurrence of her PND and orthopnea.  She has her baseline exertional dyspnea from COPD but has not had any exacerbation of her dyspnea with exertion.  No chest tightness or pressure with rest or exertion.  Her fall led to significant amount of bruising of her pain.  She also is noticing that she is under quite a bit of stress dealing with her husband who has been showing signs of dementia and now was recently lost out in the car and returned home 2 hours later.  This is putting her under quite a bit of stress and she has not been as good "taking care of herself "as she used to be.  Leading up to her fall, she had noted may be that she was having to elevate her feet more often, her edema was worse and her dyspnea was worse.  She did not however noticed this prior to her fall.  When she saw the weight being up she was concerned.  She still does well and has no she does not know of her heart rate is going fast or slow, and is otherwise asymptomatic.  She does not have any bleeding  issues on Xarelto. She has chronic lower extremity edema, but much better now.  She still has left greater than right Ankle swelling.  No syncope or near syncope.  No TIA or amaurosis fugax symptoms.  She mentions that she thinks she may have PID, because her legs hurt at the end of the day, but she does not really describe claudication symptoms (she is not all that active).   ROS: A comprehensive was performed. Review of Systems  Constitutional: Negative for malaise/fatigue and weight loss.  HENT: Negative for congestion and nosebleeds.   Respiratory: Positive for shortness of breath (No different than usual). Negative for cough.   Cardiovascular: Positive for leg swelling (Left ankle).    Gastrointestinal: Negative for abdominal pain, blood in stool, heartburn and melena.  Genitourinary: Negative for flank pain, frequency and hematuria.  Musculoskeletal: Positive for joint pain.  Neurological: Positive for dizziness (Sometimes she gets up too fast). Negative for focal weakness.  Psychiatric/Behavioral: Negative for memory loss. The patient is nervous/anxious (Concerned about her husband's health.). The patient does not have insomnia.   All other systems reviewed and are negative.  I have reviewed and (if needed) personally updated the patient's problem list, medications, allergies, past medical and surgical history, social and family history.   Past Medical History:  Diagnosis Date  . Acute myocardial infarction of anterior wall (HCC) 10/20/2013   Afib, LBBB  . Acute pulmonary edema with congestive heart failure ([place]) 10/20/2013   Resolved  . Arthritis   . Atrial fibrillation ([place]) 10/20/2013   On Warfarin  . CAD, multiple vessel 10/20/2013   LM, LAD & RCA --> CABG; MYOVIEW 12/'15: Intermediate Risk would large anterior, anteroapical segment the apex possible infarct and peri-infarct ischemia  . CHF (congestive heart failure) (HCC)    EF 40-45%.  . Chronic kidney disease   . COPD (chronic obstructive pulmonary disease) ([place])   . Diabetic neuropathy ([place])   . Heart murmur    Likely related to aortic sclerosis  . Hyperlipidemia   . Hypertension   . Hypothyroidism   . Ischemic cardiomyopathy - Notable Improvement in EF post CABG 10/20/2013   Echo 2/23: EF 20-25%; mild LVH. anteroseptal Akinesis; mid-apical anterior, inferior & inferoseptal + apical-lateral akinesis; G 1 DDysfxn.; Mild-Mod LA dil; mild MR; Mod PHTN;; F/u Echo 12/2013: EF 40-45%, septal & apical HK, mild LVH; Mod LA dilation  . Left bundle branch block (LBBB) on electrocardiogram   . Mild mitral regurgitation by prior echocardiogram    Mild-Mod MR on Echo  . Morbid obesity ([place])   . OSTEOARTHRITIS  08/06/2006  . S/P CABG x 3 10/23/2013   LIMA to LAD, SVG to OM2, SVG to RPLB, EVH via right thigh  . Type II diabetes mellitus with complication ([place]) 16/57/9038   off rx since heart attack 12/15-dr told could stay off if keep cbg under 150  . UTI (lower urinary tract infection) 09/29/14   ongoing now. started rx 09/28/14    Past Surgical History:  Procedure Laterality Date  . ABDOMINAL HYSTERECTOMY    . BREAST LUMPECTOMY W/ NEEDLE LOCALIZATION Right 10/06/2014   Dr Georgette Dover  . BREAST LUMPECTOMY WITH NEEDLE LOCALIZATION Right 10/06/2014   Procedure: RIGHT BREAST LUMPECTOMY WITH NEEDLE LOCALIZATION;  Surgeon: Donnie Mesa, MD;  Location: [place];  Service: General;  Laterality: Right;  . CLIPPING OF ATRIAL APPENDAGE N/A 10/23/2013   Procedure: CLIPPING OF ATRIAL APPENDAGE;  Surgeon: Rexene Alberts, MD;  Location: [place];  Service: Open Heart Surgery;  Laterality: N/A;  . 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  . CORONARY ARTERY BYPASS GRAFT N/A 10/23/2013   Procedure: CORONARY ARTERY BYPASS GRAFTING (CABG);  Surgeon: Rexene Alberts, MD;  Location: [place];  Service: Open Heart Surgery: LIMA-LAD, SVG-OM2, SVG-RPL  . INTRAOPERATIVE TRANSESOPHAGEAL ECHOCARDIOGRAM N/A 10/23/2013   Procedure: INTRAOPERATIVE TRANSESOPHAGEAL ECHOCARDIOGRAM;  Surgeon: Rexene Alberts, MD;  Location: [place];  Service: Open Heart Surgery;  Laterality: N/A;  . LEFT HEART CATHETERIZATION WITH CORONARY ANGIOGRAM N/A 10/20/2013   Procedure: LEFT HEART CATHETERIZATION WITH CORONARY ANGIOGRAM;  Surgeon: Leonie Man, MD;  Location[facility] C[facility] LAB;  Service: Cardiovascular: Distal LM ~70%, LAD - ostial 70%, prox 80, 95 & 95%; RCA prox 70%, mid 80%; minimal Cx disease  . LEFT HEART CATHETERIZATION WITH CORONARY/GRAFT ANGIOGRAM N/A 09/08/2014   Procedure: LEFT HEART CATHETERIZATION WITH Beatrix Fetters;  Surgeon: Leonie Man, MD;  Location: [facility] CATH LAB: Indication: Abnormal Myoview. Occluded LAD after 70 and 80% left main. Diffuse  RCA disease. Widely patent grafts to moderately diseased artery vessels. Mild-moderate LV dysfunction and chronic A. fib.  [name] NM MYOVIEW LTD  08/14/2014   Lexi scan: INTERMEDIATE RISK - large, severe intensity perfusion defect in anterior wall, apex and inferior apex that is partly reversible. This suggests either scar or possible hibernating myocardium. Nondeviated.-> Likely scar. No change in coronary anatomy with patent grafts.  [name] TOTAL HIP ARTHROPLASTY  2010, 2011   right 2010, left 2011  . TRANSTHORACIC ECHOCARDIOGRAM  12/2013   Mildly dilated left ventricle with mild to moderately reduced function. EF 40-45% (notable improvement from February 2015). Septal and apical hypokinesis. Mild LA dilation.    Current Meds  Medication Sig  . apixaban (ELIQUIS) 5 MG TABS tablet Take 5 mg by mouth 2 (two) times daily.  [name] aspirin EC 81 MG tablet Take 1 tablet (81 mg total) by mouth daily.  . carvedilol (COREG) 6.25 MG tablet TAKE ONE TABLET BY MOUTH TWICE DAILY WITH A MEAL  . co-enzyme Q-10 30 MG capsule Take 30 mg by mouth daily.  . Dulaglutide (TRULICITY) 1.06 YI/9.4WN SOPN Inject 0.75 mg into the skin every 7 (seven) days. Saturday of each week  . ferrous sulfate 325 (65 FE) MG tablet TAKE ONE TABLET BY MOUTH ONCE DAILY WITH  BREAKFAST  . Fluticasone-Umeclidin-Vilant (TRELEGY ELLIPTA) 100-62.5-25 MCG/INH AEPB Inhale 1 puff into the lungs daily.  . furosemide (LASIX) 80 MG tablet Take 80 mg by mouth 2 (two) times daily.   [name] ipratropium-albuterol (DUONEB) 0.5-2.5 (3) MG/3ML SOLN Take 3 mLs by nebulization every 4 (four) hours as needed (for shortness of breath).   [name] levothyroxine (SYNTHROID) 112 MCG tablet Take 1 tablet (112 mcg total) by mouth daily before breakfast.  . lisinopril (PRINIVIL,ZESTRIL) 2.5 MG tablet Take 2.5 mg by mouth daily.  . multivitamin-lutein (OCUVITE-LUTEIN) CAPS capsule Take 1 capsule by mouth daily.  . prednisoLONE acetate (PRED FORTE) 1 % ophthalmic suspension Place 1 drop  into the right eye daily.  [name] PROAIR HFA 108 (90 BASE) MCG/ACT inhaler Inhale 1 puff into the lungs daily.  . rosuvastatin (CRESTOR) 20 MG tablet Take 20 mg by mouth every other day.   [name] ULORIC 40 MG tablet Take 40 mg by mouth daily.    Allergies  Allergen Reactions  . Allopurinol Hives  . Tape Rash and Other (See Comments)    Uncoded Allergy. Allergen: Tape Adhesive tape    Social History   Socioeconomic History  . Marital status: Married  Spouse name: None  . Number of children: None  . Years of education: None  . Highest education level: None  Social Needs  . Financial resource strain: None  . Food insecurity - worry: None  . Food insecurity - inability: None  . Transportation needs - medical: None  . Transportation needs - non-medical: None  Occupational History  . Occupation: retired  Tobacco Use  . Smoking status: Former Smoker    Packs/day: 0.50    Years: 39.00    Pack years: 19.50    Types: Cigarettes    Last attempt to quit: 08/28/1993    Years since quitting: 24.1  . Smokeless tobacco: Never Used  Substance and Sexual Activity  . Alcohol use: No    Comment: quit 95  . Drug use: No  . Sexual activity: None  Other Topics Concern  . None  Social History Narrative   Lives with husband and son in [place].  She is currently retired.   Former smoker quit in 1995.    family history is not on file.  Wt Readings from Last 3 Encounters:  10/22/17 260 lb (117.9 kg)  10/08/17 276 lb (125.2 kg)  09/28/17 276 lb (125.2 kg)  259 lb last visit  PHYSICAL EXAM BP 108/64   Pulse (!) 111   Ht 5\' 2"  (1.575 m)   Wt 260 lb (117.9 kg)   BMI 47.55 kg/m  Physical Exam  Constitutional: She is oriented to person, place, and time. She appears well-developed and well-nourished. No distress.  HENT:  Head: Normocephalic and atraumatic.  Mouth/Throat: Oropharynx is clear and moist.  Eyes: Right eye exhibits no discharge. Left eye exhibits no discharge. No scleral  icterus.  Right eye is somewhat frosted over from her corneal transplant. Hematocrit cloudy with decreased abductor and adductor muscle control.  Neck: No hepatojugular reflux and no JVD present. Carotid bruit is not present.  Cardiovascular: Normal rate, normal heart sounds and intact distal pulses. An irregularly irregular rhythm present. Exam reveals no gallop and no friction rub.  No murmur heard. Unable to palpate PMI  Pulmonary/Chest: Effort normal and breath sounds normal. No respiratory distress. She has no wheezes.  Abdominal: Soft. Bowel sounds are normal. She exhibits no distension. There is no tenderness. There is no rebound.  Musculoskeletal: Normal range of motion. She exhibits edema (1-2+ left leg, 1+ right).  Neurological: She is alert and oriented to person, place, and time.  Skin: Skin is warm and dry. No rash noted.  Psychiatric: She has a normal mood and affect. Her behavior is normal. Judgment and thought content normal.  Very tearful and anxious today discussing her husband.  Nursing note and vitals reviewed.    Adult ECG Report  Rate: 78;  Rhythm: atrial fibrillation and Left axis deviation (-64) LBBB.;   Narrative Interpretation: Remains in A. fib. Rate controlled  Other studies Reviewed: Additional studies/ records that were reviewed today include:  Recent Labs: Followed by PCP Lab Results  Component Value Date   CHOL 147 10/21/2013   HDL 83 10/21/2013   LDLCALC 50 10/21/2013   TRIG 69 10/21/2013   CHOLHDL 1.8 10/21/2013    ASSESSMENT / PLAN: Problem List Items Addressed This Visit    Bilateral leg pain   CAD status post CABG (Chronic)    Significant CAD noted back in 2015 when she came in with acute coronary syndrome in setting of A. fib RVR and acute heart failure.  She is on aspirin and Eliquis (low threshold  to stop aspirin.  She is on carvedilol and lisinopril at moderate and low-dose with relatively well-controlled blood pressure (if anything low).    She is on rosuvastatin.  No need for change at this time      Chronic combined systolic and diastolic CHF, NYHA class 2 (HCC) (Chronic)    Chronic CHF with recent exacerbation.  This was treated aggressively with increased oral diuretic.  She is still on 80 twice daily and I think could probably wean down to 80 mg once a day and use additional dose on as-needed basis.  We discussed the as needed plan.  Continue stable dose of carvedilol and lisinopril       Edema of both legs (Chronic)    Chronic issue controlled with furosemide.  I suspect there is at least a component of combined heart failure associated. Target dry weight is right about what it is today 260 pounds.  Continue sliding scale Lasix per recommendations below  Continue support stockings      Essential hypertension (Chronic)    Currently well controlled.  Concern is that she does get lightheaded and dizzy near the end of day.  My recommendation would be to switch the lisinopril dose to evening and have the option of holding it if she feels lightheaded and dizzy.  Otherwise continue current dose of carvedilol.      History of acute anterior wall myocardial infarction (Chronic)    Despite being what appeared to be anterior infarct, she had multivessel disease.  There is a large fixed defect and Myoview with minimal reversibility.  Patent grafts.  Mildly reduced EF but no active heart failure symptoms.      Hyperlipidemia with target LDL less than 70 (Chronic)    On Crestor.  Managed by PCP.  Have not seen results lately.  Goal LDL is less than 70.  Please let us know if there is any issues.      ICM EF by Echo 20-25% Feb 2015, recently improved to 40-45% by echo 01/13/14 (Chronic)    Notably improved EF on echo, but still continues to have heart failure.  Her Lasix had been adjusted by her nephrologist, but I think at this point she is pre-much back to her dry weight and we can reduce to once daily with the option to use a  second dose as needed.      Left bundle branch block (LBBB) on electrocardiogram (Chronic)   Relevant Orders   EKG 12-Lead   Left main coronary artery disease (Chronic)    Status post CABG.  Follow-up Myoview was abnormal but grafts were patent on cath.  Remains on beta-blocker, ACE-I and statin along with low-dose aspirin.      PAF- doing MI and perioperative CABG; CHA2DS2-VASc Score 6, on Eliquis - Primary (Chronic)    Now seems like may be more permanent A. fib.  Not very symptomatic.  Given her lack of symptoms and lack of significant activity, unless she has worsening heart failure I would not choose to try to get her back out of A. fib.  My suspicion is that she she will go right back into it without antiarrhythmic.  At this point would like to avoid an antiarrhythmic. No bleeding issues with Xarelto. Rate controlled with carvedilol.  The rate on vitals is not accurate, the rate on EKG is more accurate      Relevant Orders   EKG 12-Lead   S/P CABG x 3- 10/23/13 (Chronic)   Severe obesity (  BMI >= 40) (HCC) (Chronic)      Current medicines are reviewed at length with the patient today. (+/- concerns) n/a The following changes have been made: n/a  Patient Instructions  MEDICATION INSTRUCTION  CHANGE TO TAKING --- LISINOPRIL AT SUPPER TIME   LASIX ( FUROSEMIDE )80 MG  BY MOUTH DAILY ( ONE TABLET) USE THE INSTRUCTION BELOW : Sliding scale Lasix: Weigh yourself when you get home, then Daily in the Morning. Your dry weight will be what your scale says on the day you return home.(here is  260 lbs.).   If you gain more than 3 pounds from dry weight: Increase the Lasix dosing to 80 mg in the morning and 80 mg in the afternoon until weight returns to baseline dry weight.   If the weight goes down more than 3 pounds from dry weight: Hold Lasix until it returns to baseline dry weight     Your physician wants you to follow-up in [place]. You will receive a  reminder letter in the mail two months in advance. If you don't receive a letter, please call our office to schedule the follow-up appointment.  If you need a refill on your cardiac medications before your next appointment, please call your pharmacy.    Studies Ordered:   Orders Placed This Encounter  Procedures  . EKG 12-Lead      Glenetta Hew, M.D., M.S. Interventional Cardiologist   Pager # [phone] Phone # [phone] [address] [place], [place] [address]

## 2017-10-22 NOTE — Patient Instructions (Addendum)
MEDICATION INSTRUCTION  CHANGE TO TAKING --- LISINOPRIL AT SUPPER TIME   LASIX ( FUROSEMIDE )80 MG  BY MOUTH DAILY ( ONE TABLET) USE THE INSTRUCTION BELOW : Sliding scale Lasix: Weigh yourself when you get home, then Daily in the Morning. Your dry weight will be what your scale says on the day you return home.(here is  260 lbs.).   If you gain more than 3 pounds from dry weight: Increase the Lasix dosing to 80 mg in the morning and 80 mg in the afternoon until weight returns to baseline dry weight.   If the weight goes down more than 3 pounds from dry weight: Hold Lasix until it returns to baseline dry weight     Your physician wants you to follow-up in [place]. You will receive a reminder letter in the mail two months in advance. If you don't receive a letter, please call our office to schedule the follow-up appointment.  If you need a refill on your cardiac medications before your next appointment, please call your pharmacy.

## 2017-10-22 NOTE — Assessment & Plan Note (Signed)
On Crestor.  Managed by PCP.  Have not seen results lately.  Goal LDL is less than 70.  Please let us know if there is any issues.

## 2017-10-22 NOTE — Assessment & Plan Note (Signed)
Significant CAD noted back in 2015 when she came in with acute coronary syndrome in setting of A. fib RVR and acute heart failure.  She is on aspirin and Eliquis (low threshold to stop aspirin.  She is on carvedilol and lisinopril at moderate and low-dose with relatively well-controlled blood pressure (if anything low).   She is on rosuvastatin.  No need for change at this time

## 2017-11-07 ENCOUNTER — Ambulatory Visit (INDEPENDENT_AMBULATORY_CARE_PROVIDER_SITE_OTHER): Payer: Medicare Other | Admitting: Pulmonary Disease

## 2017-11-07 ENCOUNTER — Encounter: Payer: Self-pay | Admitting: Pulmonary Disease

## 2017-11-07 ENCOUNTER — Ambulatory Visit: Payer: Medicare Other | Admitting: Pulmonary Disease

## 2017-11-07 VITALS — BP 122/60 | HR 73 | Ht 62.0 in | Wt 260.0 lb

## 2017-11-07 DIAGNOSIS — J449 Chronic obstructive pulmonary disease, unspecified: Secondary | ICD-10-CM | POA: Diagnosis not present

## 2017-11-07 DIAGNOSIS — R0602 Shortness of breath: Secondary | ICD-10-CM | POA: Diagnosis not present

## 2017-11-07 LAB — PULMONARY FUNCTION TEST
DL/VA % PRED: 88 %
DL/VA: 4.03 ml/min/mmHg/L
DLCO COR % PRED: 61 %
DLCO cor: 13.35 ml/min/mmHg
DLCO unc % pred: 56 %
DLCO unc: 12.15 ml/min/mmHg
FEF 25-75 POST: 0.79 L/s
FEF 25-75 Pre: 0.67 L/sec
FEF2575-%Change-Post: 18 %
FEF2575-%Pred-Post: 61 %
FEF2575-%Pred-Pre: 51 %
FEV1-%Change-Post: 3 %
FEV1-%Pred-Post: 76 %
FEV1-%Pred-Pre: 73 %
FEV1-POST: 1.12 L
FEV1-Pre: 1.08 L
FEV1FVC-%CHANGE-POST: 0 %
FEV1FVC-%Pred-Pre: 95 %
FEV6-%Change-Post: 3 %
FEV6-%PRED-PRE: 82 %
FEV6-%Pred-Post: 85 %
FEV6-PRE: 1.5 L
FEV6-Post: 1.55 L
FEV6FVC-%PRED-PRE: 105 %
FEV6FVC-%Pred-Post: 105 %
FVC-%Change-Post: 3 %
FVC-%PRED-POST: 81 %
FVC-%PRED-PRE: 78 %
FVC-POST: 1.55 L
FVC-PRE: 1.5 L
POST FEV6/FVC RATIO: 100 %
Post FEV1/FVC ratio: 72 %
Pre FEV1/FVC ratio: 72 %
Pre FEV6/FVC Ratio: 100 %
RV % pred: 80 %
RV: 1.79 L
TLC % PRED: 76 %
TLC: 3.64 L

## 2017-11-07 NOTE — Progress Notes (Signed)
Elaine [name]    [id number]    [birth year]  Primary Care Physician:Shaw, Gwyndolyn Saxon, MD  Referring Physician: Marton Redwood, MD [address] [place],  [address]  Chief complaint: Follow up for COPD  HPI: 77 year old with COPD, ischemic cardiomyopathy, atrial fibrillation on Eliquis, coronary artery disease, diabetes, chronic kidney disease.  She was diagnosed with COPD about 2 years ago.  She was previously maintained on Anoro inhaler, saw her primary care on September 14, 2017 with progressive increasing dyspnea.  She was started on supplemental oxygen and anoro changed to trelegy. Per office note she was treated with right lower lobe pneumonia with antibiotics.  Repeat chest x-ray in early January shows resolution of the pneumonia.  I do not have these images to review. Reports chronic dyspnea on exertion, cough with white mucus.  No fevers, chills, hemoptysis.   Pets: No pets Occupation: Retired Immunologist Exposures: No known exposures.  No mold, hot tub Smoking history: 20-pack-year smoking history.  Quit in 1995 Travel History: Not significant.  Interval history: Has a recent mild exacerbation of CHF with 14-16 pound weight gain.  She has followed up with Dr. Ellyn Hack, cardiology.  She is on increased Lasix dose with improvement. States that breathing has been up and down.  Has productive cough with clear mucus, dyspnea on exertion and wheezing.  Outpatient Encounter Medications as of 11/07/2017  Medication Sig  . apixaban (ELIQUIS) 5 MG TABS tablet Take 5 mg by mouth 2 (two) times daily.  [name] aspirin EC 81 MG tablet Take 1 tablet (81 mg total) by mouth daily.  . carvedilol (COREG) 6.25 MG tablet TAKE ONE TABLET BY MOUTH TWICE DAILY WITH A MEAL  . co-enzyme Q-10 30 MG capsule Take 30 mg by mouth daily.  . Dulaglutide (TRULICITY) 0.94 BS/9.6GE SOPN Inject 0.75 mg into the skin every 7 (seven) days. Saturday of each week  . ferrous sulfate 325 (65 FE) MG tablet TAKE ONE TABLET  BY MOUTH ONCE DAILY WITH  BREAKFAST  . Fluticasone-Umeclidin-Vilant (TRELEGY ELLIPTA) 100-62.5-25 MCG/INH AEPB Inhale 1 puff into the lungs daily.  . furosemide (LASIX) 80 MG tablet Take 80 mg by mouth 2 (two) times daily.   [name] ipratropium-albuterol (DUONEB) 0.5-2.5 (3) MG/3ML SOLN Take 3 mLs by nebulization every 4 (four) hours as needed (for shortness of breath).   [name] levothyroxine (SYNTHROID) 112 MCG tablet Take 1 tablet (112 mcg total) by mouth daily before breakfast.  . lisinopril (PRINIVIL,ZESTRIL) 2.5 MG tablet Take 2.5 mg by mouth daily.  . multivitamin-lutein (OCUVITE-LUTEIN) CAPS capsule Take 1 capsule by mouth daily.  . prednisoLONE acetate (PRED FORTE) 1 % ophthalmic suspension Place 1 drop into the right eye daily.  [name] PROAIR HFA 108 (90 BASE) MCG/ACT inhaler Inhale 1 puff into the lungs daily.  . rosuvastatin (CRESTOR) 20 MG tablet Take 20 mg by mouth every other day.   [name] ULORIC 40 MG tablet Take 40 mg by mouth daily.   No facility-administered encounter medications on file as of 11/07/2017.     Allergies as of 11/07/2017 - Review Complete 11/07/2017  Allergen Reaction Noted  . Allopurinol Hives 02/02/2014  . Tape Rash and Other (See Comments) 12/11/2011    Past Medical History:  Diagnosis Date  . Acute myocardial infarction of anterior wall (HCC) 10/20/2013   Afib, LBBB  . Acute pulmonary edema with congestive heart failure ([place]) 10/20/2013   Resolved  . Arthritis   . Atrial fibrillation ([place]) 10/20/2013   On  Warfarin  . CAD, multiple vessel 10/20/2013   LM, LAD & RCA --> CABG; MYOVIEW 12/'15: Intermediate Risk would large anterior, anteroapical segment the apex possible infarct and peri-infarct ischemia  . CHF (congestive heart failure) (HCC)    EF 40-45%.  . Chronic kidney disease   . COPD (chronic obstructive pulmonary disease) ([place])   . Diabetic neuropathy ([place])   . Heart murmur    Likely related to aortic sclerosis  . Hyperlipidemia   . Hypertension   .  Hypothyroidism   . Ischemic cardiomyopathy - Notable Improvement in EF post CABG 10/20/2013   Echo 2/23: EF 20-25%; mild LVH. anteroseptal Akinesis; mid-apical anterior, inferior & inferoseptal + apical-lateral akinesis; G 1 DDysfxn.; Mild-Mod LA dil; mild MR; Mod PHTN;; F/u Echo 12/2013: EF 40-45%, septal & apical HK, mild LVH; Mod LA dilation  . Left bundle branch block (LBBB) on electrocardiogram   . Mild mitral regurgitation by prior echocardiogram    Mild-Mod MR on Echo  . Morbid obesity ([place])   . OSTEOARTHRITIS 08/06/2006  . S/P CABG x 3 10/23/2013   LIMA to LAD, SVG to OM2, SVG to RPLB, EVH via right thigh  . Type II diabetes mellitus with complication ([place]) 68/34/1962   off rx since heart attack 12/15-dr told could stay off if keep cbg under 150  . UTI (lower urinary tract infection) 09/29/14   ongoing now. started rx 09/28/14    Past Surgical History:  Procedure Laterality Date  . ABDOMINAL HYSTERECTOMY    . BREAST LUMPECTOMY W/ NEEDLE LOCALIZATION Right 10/06/2014   Dr Georgette Dover  . BREAST LUMPECTOMY WITH NEEDLE LOCALIZATION Right 10/06/2014   Procedure: RIGHT BREAST LUMPECTOMY WITH NEEDLE LOCALIZATION;  Surgeon: Donnie Mesa, MD;  Location: [facility];  Service: General;  Laterality: Right;  . CLIPPING OF ATRIAL APPENDAGE N/A 10/23/2013   Procedure: CLIPPING OF ATRIAL APPENDAGE;  Surgeon: Rexene Alberts, MD;  Location: [facility];  Service: Open Heart Surgery;  Laterality: N/A;  . 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  . CORONARY ARTERY BYPASS GRAFT N/A 10/23/2013   Procedure: CORONARY ARTERY BYPASS GRAFTING (CABG);  Surgeon: Rexene Alberts, MD;  Location: [facility];  Service: Open Heart Surgery: LIMA-LAD, SVG-OM2, SVG-RPL  . INTRAOPERATIVE TRANSESOPHAGEAL ECHOCARDIOGRAM N/A 10/23/2013   Procedure: INTRAOPERATIVE TRANSESOPHAGEAL ECHOCARDIOGRAM;  Surgeon: Rexene Alberts, MD;  Location: [facility];  Service: Open Heart Surgery;  Laterality: N/A;  . LEFT HEART CATHETERIZATION WITH CORONARY ANGIOGRAM N/A  10/20/2013   Procedure: LEFT HEART CATHETERIZATION WITH CORONARY ANGIOGRAM;  Surgeon: Leonie Man, MD;  Location: [facility] LAB;  Service: Cardiovascular: Distal LM ~70%, LAD - ostial 70%, prox 80, 95 & 95%; RCA prox 70%, mid 80%; minimal Cx disease  . LEFT HEART CATHETERIZATION WITH CORONARY/GRAFT ANGIOGRAM N/A 09/08/2014   Procedure: LEFT HEART CATHETERIZATION WITH Beatrix Fetters;  Surgeon: Leonie Man, MD;  Location: [facility] CATH LAB: Indication: Abnormal Myoview. Occluded LAD after 70 and 80% left main. Diffuse RCA disease. Widely patent grafts to moderately diseased artery vessels. Mild-moderate LV dysfunction and chronic A. fib.  [name] NM MYOVIEW LTD  08/14/2014   Lexi scan: INTERMEDIATE RISK - large, severe intensity perfusion defect in anterior wall, apex and inferior apex that is partly reversible. This suggests either scar or possible hibernating myocardium. Nondeviated.-> Likely scar. No change in coronary anatomy with patent grafts.  [name] TOTAL HIP ARTHROPLASTY  2010, 2011   right 2010, left 2011  . TRANSTHORACIC ECHOCARDIOGRAM  12/2013   Mildly dilated left ventricle  with mild to moderately reduced function. EF 40-45% (notable improvement from February 2015). Septal and apical hypokinesis. Mild LA dilation.    Family History  Problem Relation Age of Onset  . Colon cancer Neg Hx   . Esophageal cancer Neg Hx   . Rectal cancer Neg Hx   . Stomach cancer Neg Hx     Social History   Socioeconomic History  . Marital status: Married    Spouse name: Not on file  . Number of children: Not on file  . Years of education: Not on file  . Highest education level: Not on file  Social Needs  . Financial resource strain: Not on file  . Food insecurity - worry: Not on file  . Food insecurity - inability: Not on file  . Transportation needs - medical: Not on file  . Transportation needs - non-medical: Not on file  Occupational History  . Occupation: retired  Tobacco Use  . Smoking  status: Former Smoker    Packs/day: 0.50    Years: 39.00    Pack years: 19.50    Types: Cigarettes    Last attempt to quit: 08/28/1993    Years since quitting: 24.2  . Smokeless tobacco: Never Used  Substance and Sexual Activity  . Alcohol use: No    Comment: quit 95  . Drug use: No  . Sexual activity: Not on file  Other Topics Concern  . Not on file  Social History Narrative   Lives with husband and son in [place].  She is currently retired.   Former smoker quit in 1995.    Review of systems: Review of Systems  Constitutional: Negative for fever and chills.  HENT: Negative.   Eyes: Negative for blurred vision.  Respiratory: as per HPI  Cardiovascular: Negative for chest pain and palpitations.  Gastrointestinal: Negative for vomiting, diarrhea, blood per rectum. Genitourinary: Negative for dysuria, urgency, frequency and hematuria.  Musculoskeletal: Negative for myalgias, back pain and joint pain.  Skin: Negative for itching and rash.  Neurological: Negative for dizziness, tremors, focal weakness, seizures and loss of consciousness.  Endo/Heme/Allergies: Negative for environmental allergies.  Psychiatric/Behavioral: Negative for depression, suicidal ideas and hallucinations.  All other systems reviewed and are negative.  Physical Exam: Blood pressure 122/60, pulse 73, height 5\' 2"  (1.575 m), weight 260 lb (117.9 kg), SpO2 93 %. Gen:      No acute distress HEENT:  EOMI, sclera anicteric Neck:     No masses; no thyromegaly Lungs:    Clear to auscultation bilaterally; normal respiratory effort CV:         Regular rate and rhythm; no murmurs Abd:      + bowel sounds; soft, non-tender; no palpable masses, no distension Ext:    No edema; adequate peripheral perfusion Skin:      Warm and dry; no rash Neuro: alert and oriented x 3 Psych: normal mood and affect  Data Reviewed: PFTs 04/03/14 FVC 1.93 [95%], FEV1 1.43 [90%], F/F 74, TLC 94%, DLCO 62% Mild obstruction with  response to bronchodilator response.  Moderate diffusion defect.  PFTs 11/07/17 FVC 1.55 [81%], FEV1 1.12 [76%], F/F 72, TLC 76%, DLCO 56%. Mild obstruction, minimal restriction, moderate diffusion defect  Chest x-ray 07/24/16-hyperinflation, cardiomegaly.   Chest x-ray 09/28/17- mild cardiomegaly with CHF. I have reviewed the images personally  Assessment:  Consult for dyspnea, COPD Suspect her dyspnea is multifactorial from COPD, chronic heart failure, kidney disease.  PFTS reviewed with mild obstructive changes. Continue on trelegy.  She is qualified for oxygen and portable concentrator.  She is awaiting delivery from the DME company.  Plan/Recommendations: - Continue trelegy, duonebs, supplemental oxygen.  Awaiting delivery of portable concentrator  Marshell Garfinkel MD [facility] Pager [phone] 11/07/2017, 11:44 AM  CC: Marton Redwood, MD

## 2017-11-07 NOTE — Progress Notes (Signed)
PFT done today. 

## 2017-11-07 NOTE — Patient Instructions (Signed)
We will check on the status of the oxygen concentrator Your PFTs confirm COPD Continue on the inhalers as prescribed.  Continue on duo nebs as needed Follow-up in 6 months.

## 2017-11-16 ENCOUNTER — Telehealth: Payer: Self-pay | Admitting: Pulmonary Disease

## 2017-11-16 NOTE — Telephone Encounter (Signed)
Elaine [name] has received CMN. CMN will be given to Dr. Vaughan Browner 11/19/17.

## 2017-11-16 NOTE — Telephone Encounter (Signed)
Spoke to [place] with [employer]. Lavella Lemons states Rx for POC is needed.  I have requested that [facility] re fax Rx, as we have not received Rx.  Pt is aware of this information, Nothing further is needed.

## 2017-11-21 NOTE — Telephone Encounter (Signed)
CMN has been signed and given back to [place].  Nothing further is needed.

## 2017-12-05 NOTE — Progress Notes (Signed)
Recent Labs: 11/14/17 (PCP) Na+ 140, K+ 4.2, Cl- 101, HCO3- 27 , BUN 44, Cr 2.0, Glu 109, Ca2+ 10.0; AST 26, ALT 14, AlkP 96 CBC: W 5.49, H/H 12/39.8, Plt 196

## 2018-05-06 ENCOUNTER — Encounter: Payer: Self-pay | Admitting: Pulmonary Disease

## 2018-05-06 ENCOUNTER — Ambulatory Visit (INDEPENDENT_AMBULATORY_CARE_PROVIDER_SITE_OTHER): Payer: Medicare Other | Admitting: Pulmonary Disease

## 2018-05-06 VITALS — BP 110/70 | HR 51 | Ht 62.0 in | Wt 247.7 lb

## 2018-05-06 DIAGNOSIS — J449 Chronic obstructive pulmonary disease, unspecified: Secondary | ICD-10-CM | POA: Diagnosis not present

## 2018-05-06 MED ORDER — FLUTICASONE-UMECLIDIN-VILANT 100-62.5-25 MCG/INH IN AEPB: 1.0000 | INHALATION_SPRAY | Freq: Every day | RESPIRATORY_TRACT | 3 refills | Status: DC

## 2018-05-06 NOTE — Progress Notes (Signed)
Elaine [name]    [id number]    06/24/1941  Primary Care Physician:Shaw, Gwyndolyn Saxon, MD  Referring Physician: Marton Redwood, MD [address] [place], [place] [address]  Chief complaint: Follow up for COPD  HPI: 77 year old with COPD, ischemic cardiomyopathy, atrial fibrillation on Eliquis, coronary artery disease, diabetes, chronic kidney disease.  She was diagnosed with COPD around 2017.  She was previously maintained on Anoro inhaler, saw her primary care on September 14, 2017 with progressive increasing dyspnea.  She was started on supplemental oxygen and anoro changed to trelegy. Per office note she was treated with right lower lobe pneumonia with antibiotics.  Repeat chest x-ray in early January 2019 shows resolution of the pneumonia.  I do not have these images to review. Reports chronic dyspnea on exertion, cough with white mucus.  No fevers, chills, hemoptysis. Follows-up with Dr. Ellyn Hack for CHF, cardiomyopathy.   Pets: No pets Occupation: Retired Immunologist Exposures: No known exposures.  No mold, hot tub Smoking history: 20-pack-year smoking history.  Quit in 1995 Travel History: Not significant.  Interval history: Continues on Proair, Ventolin inhaler.  She is unable to afford the trelegy. Reports good days and bad days with her breathing.  Physical Exam: Blood pressure 110/70, pulse (!) 51, height 5\' 2"  (1.575 m), weight 247 lb 11.2 oz (112.4 kg), SpO2 100 %. Gen:      No acute distress HEENT:  EOMI, sclera anicteric Neck:     No masses; no thyromegaly Lungs:    Clear to auscultation bilaterally; normal respiratory effort CV:         Regular rate and rhythm; no murmurs Abd:      + bowel sounds; soft, non-tender; no palpable masses, no distension Ext:    No edema; adequate peripheral perfusion Skin:      Warm and dry; no rash Neuro: alert and oriented x 3 Psych: normal mood and affect  Data Reviewed: PFTs 04/03/14 FVC 1.93 [95%], FEV1 1.43 [90%], F/F 74, TLC 94%,  DLCO 62% Mild obstruction with response to bronchodilator response.  Moderate diffusion defect.  PFTs 11/07/17 FVC 1.55 [81%], FEV1 1.12 [76%], F/F 72, TLC 76%, DLCO 56%. Mild obstruction, minimal restriction, moderate diffusion defect  Chest x-ray 07/24/16-hyperinflation, cardiomegaly.   Chest x-ray 09/28/17- mild cardiomegaly with CHF. I have reviewed the images personally  Assessment:  Follow up for mild COPD Suspect her dyspnea is multifactorial from COPD, chronic heart failure, kidney disease.  PFTS reviewed with mild obstructive changes. Continue on trelegy.  We will give her samples and a coupon to help with the cost of the medication Continue supplemental oxygen  Health maintenance Received flu vaccine from her primary care We will need to check status of pneumonia vaccine.  Plan/Recommendations: - Continue trelegy, duonebs, supplemental oxygen.    Outpatient Encounter Medications as of 05/06/2018  Medication Sig  . apixaban (ELIQUIS) 5 MG TABS tablet Take 5 mg by mouth 2 (two) times daily.  [name] aspirin EC 81 MG tablet Take 1 tablet (81 mg total) by mouth daily.  . carvedilol (COREG) 6.25 MG tablet TAKE ONE TABLET BY MOUTH TWICE DAILY WITH A MEAL  . co-enzyme Q-10 30 MG capsule Take 30 mg by mouth daily.  . Dulaglutide (TRULICITY) 9.37 JI/9.6VE SOPN Inject 0.75 mg into the skin every 7 (seven) days. Saturday of each week  . ferrous sulfate 325 (65 FE) MG tablet TAKE ONE TABLET BY MOUTH ONCE DAILY WITH  BREAKFAST  . Fluticasone-Umeclidin-Vilant (TRELEGY ELLIPTA)  100-62.5-25 MCG/INH AEPB Inhale 1 puff into the lungs daily.  . furosemide (LASIX) 80 MG tablet Take 80 mg by mouth 2 (two) times daily.   [name] ipratropium-albuterol (DUONEB) 0.5-2.5 (3) MG/3ML SOLN Take 3 mLs by nebulization every 4 (four) hours as needed (for shortness of breath).   [name] levothyroxine (SYNTHROID) 112 MCG tablet Take 1 tablet (112 mcg total) by mouth daily before breakfast.  . lisinopril (PRINIVIL,ZESTRIL) 2.5  MG tablet Take 2.5 mg by mouth daily.  . multivitamin-lutein (OCUVITE-LUTEIN) CAPS capsule Take 1 capsule by mouth daily.  . prednisoLONE acetate (PRED FORTE) 1 % ophthalmic suspension Place 1 drop into the right eye daily.  [name] PROAIR HFA 108 (90 BASE) MCG/ACT inhaler Inhale 1 puff into the lungs daily.  . rosuvastatin (CRESTOR) 20 MG tablet Take 20 mg by mouth every other day.   [name] ULORIC 40 MG tablet Take 40 mg by mouth daily.   No facility-administered encounter medications on file as of 05/06/2018.     Allergies as of 05/06/2018 - Review Complete 05/06/2018  Allergen Reaction Noted  . Allopurinol Hives 02/02/2014  . Tape Rash and Other (See Comments) 12/11/2011    Past Medical History:  Diagnosis Date  . Acute myocardial infarction of anterior wall (HCC) 10/20/2013   Afib, LBBB  . Acute pulmonary edema with congestive heart failure ([place]) 10/20/2013   Resolved  . Arthritis   . Atrial fibrillation ([place]) 10/20/2013   On Warfarin  . CAD, multiple vessel 10/20/2013   LM, LAD & RCA --> CABG; MYOVIEW 12/'15: Intermediate Risk would large anterior, anteroapical segment the apex possible infarct and peri-infarct ischemia  . CHF (congestive heart failure) (HCC)    EF 40-45%.  . Chronic kidney disease   . COPD (chronic obstructive pulmonary disease) ([place])   . Diabetic neuropathy ([place])   . Heart murmur    Likely related to aortic sclerosis  . Hyperlipidemia   . Hypertension   . Hypothyroidism   . Ischemic cardiomyopathy - Notable Improvement in EF post CABG 10/20/2013   Echo 2/23: EF 20-25%; mild LVH. anteroseptal Akinesis; mid-apical anterior, inferior & inferoseptal + apical-lateral akinesis; G 1 DDysfxn.; Mild-Mod LA dil; mild MR; Mod PHTN;; F/u Echo 12/2013: EF 40-45%, septal & apical HK, mild LVH; Mod LA dilation  . Left bundle branch block (LBBB) on electrocardiogram   . Mild mitral regurgitation by prior echocardiogram    Mild-Mod MR on Echo  . Morbid obesity ([place])   . OSTEOARTHRITIS  08/06/2006  . S/P CABG x 3 10/23/2013   LIMA to LAD, SVG to OM2, SVG to RPLB, EVH via right thigh  . Type II diabetes mellitus with complication ([place]) 92/08/69   off rx since heart attack 12/15-dr told could stay off if keep cbg under 150  . UTI (lower urinary tract infection) 09/29/14   ongoing now. started rx 09/28/14    Past Surgical History:  Procedure Laterality Date  . ABDOMINAL HYSTERECTOMY    . BREAST LUMPECTOMY W/ NEEDLE LOCALIZATION Right 10/06/2014   Dr Georgette Dover  . BREAST LUMPECTOMY WITH NEEDLE LOCALIZATION Right 10/06/2014   Procedure: RIGHT BREAST LUMPECTOMY WITH NEEDLE LOCALIZATION;  Surgeon: Donnie Mesa, MD;  Location: [place];  Service: General;  Laterality: Right;  . CLIPPING OF ATRIAL APPENDAGE N/A 10/23/2013   Procedure: CLIPPING OF ATRIAL APPENDAGE;  Surgeon: Rexene Alberts, MD;  Location: [place];  Service: Open Heart Surgery;  Laterality: N/A;  . 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  . CORONARY ARTERY BYPASS  GRAFT N/A 10/23/2013   Procedure: CORONARY ARTERY BYPASS GRAFTING (CABG);  Surgeon: Rexene Alberts, MD;  Location: [facility];  Service: Open Heart Surgery: LIMA-LAD, SVG-OM2, SVG-RPL  . INTRAOPERATIVE TRANSESOPHAGEAL ECHOCARDIOGRAM N/A 10/23/2013   Procedure: INTRAOPERATIVE TRANSESOPHAGEAL ECHOCARDIOGRAM;  Surgeon: Rexene Alberts, MD;  Location: [facility];  Service: Open Heart Surgery;  Laterality: N/A;  . LEFT HEART CATHETERIZATION WITH CORONARY ANGIOGRAM N/A 10/20/2013   Procedure: LEFT HEART CATHETERIZATION WITH CORONARY ANGIOGRAM;  Surgeon: Leonie Man, MD;  Location: [facility] CATH LAB;  Service: Cardiovascular: Distal LM ~70%, LAD - ostial 70%, prox 80, 95 & 95%; RCA prox 70%, mid 80%; minimal Cx disease  . LEFT HEART CATHETERIZATION WITH CORONARY/GRAFT ANGIOGRAM N/A 09/08/2014   Procedure: LEFT HEART CATHETERIZATION WITH Beatrix Fetters;  Surgeon: Leonie Man, MD;  [facility] CATH LAB: Indication: Abnormal Myoview. Occluded LAD after 70 and 80% left main. Diffuse  RCA disease. Widely patent grafts to moderately diseased artery vessels. Mild-moderate LV dysfunction and chronic A. fib.  [name] NM MYOVIEW LTD  08/14/2014   Lexi scan: INTERMEDIATE RISK - large, severe intensity perfusion defect in anterior wall, apex and inferior apex that is partly reversible. This suggests either scar or possible hibernating myocardium. Nondeviated.-> Likely scar. No change in coronary anatomy with patent grafts.  [name] TOTAL HIP ARTHROPLASTY  2010, 2011   right 2010, left 2011  . TRANSTHORACIC ECHOCARDIOGRAM  12/2013   Mildly dilated left ventricle with mild to moderately reduced function. EF 40-45% (notable improvement from February 2015). Septal and apical hypokinesis. Mild LA dilation.    Family History  Problem Relation Age of Onset  . Colon cancer Neg Hx   . Esophageal cancer Neg Hx   . Rectal cancer Neg Hx   . Stomach cancer Neg Hx     Social History   Socioeconomic History  . Marital status: Married    Spouse name: Not on file  . Number of children: Not on file  . Years of education: Not on file  . Highest education level: Not on file  Occupational History  . Occupation: retired  Scientific laboratory technician  . Financial resource strain: Not on file  . Food insecurity:    Worry: Not on file    Inability: Not on file  . Transportation needs:    Medical: Not on file    Non-medical: Not on file  Tobacco Use  . Smoking status: Former Smoker    Packs/day: 0.50    Years: 39.00    Pack years: 19.50    Types: Cigarettes    Last attempt to quit: 08/28/1993    Years since quitting: 24.7  . Smokeless tobacco: Never Used  Substance and Sexual Activity  . Alcohol use: No    Comment: quit 95  . Drug use: No  . Sexual activity: Not on file  Lifestyle  . Physical activity:    Days per week: Not on file    Minutes per session: Not on file  . Stress: Not on file  Relationships  . Social connections:    Talks on phone: Not on file    Gets together: Not on file    Attends  religious service: Not on file    Active member of club or organization: Not on file    Attends meetings of clubs or organizations: Not on file    Relationship status: Not on file  . Intimate partner violence:    Fear of current or ex partner: Not on file  Emotionally abused: Not on file    Physically abused: Not on file    Forced sexual activity: Not on file  Other Topics Concern  . Not on file  Social History Narrative   Lives with husband and son in [place].  She is currently retired.   Former smoker quit in 1995.    Review of systems: Review of Systems  Constitutional: Negative for fever and chills.  HENT: Negative.   Eyes: Negative for blurred vision.  Respiratory: as per HPI  Cardiovascular: Negative for chest pain and palpitations.  Gastrointestinal: Negative for vomiting, diarrhea, blood per rectum. Genitourinary: Negative for dysuria, urgency, frequency and hematuria.  Musculoskeletal: Negative for myalgias, back pain and joint pain.  Skin: Negative for itching and rash.  Neurological: Negative for dizziness, tremors, focal weakness, seizures and loss of consciousness.  Endo/Heme/Allergies: Negative for environmental allergies.  Psychiatric/Behavioral: Negative for depression, suicidal ideas and hallucinations.  All other systems reviewed and are negative.  Marshell Garfinkel MD [facility] 05/06/2018, 9:47 AM  CC: Marton Redwood, MD

## 2018-05-06 NOTE — Patient Instructions (Signed)
I am glad you are stable with your breathing We will give you samples of Trelegy and a coupon to help with that  Continue supplemental oxygen Follow-up in 6 months with nurse practitioner.

## 2018-05-06 NOTE — Addendum Note (Signed)
Addended by: Vivia Ewing on: 05/06/2018 10:11 AM   Modules accepted: Orders

## 2018-07-17 ENCOUNTER — Other Ambulatory Visit: Payer: Self-pay | Admitting: Internal Medicine

## 2018-07-17 DIAGNOSIS — R921 Mammographic calcification found on diagnostic imaging of breast: Secondary | ICD-10-CM

## 2018-08-19 ENCOUNTER — Ambulatory Visit
Admission: RE | Admit: 2018-08-19 | Discharge: 2018-08-19 | Disposition: A | Discharge status: Home / Self Care | Payer: Medicare Other | Source: Ambulatory Visit | Attending: Internal Medicine | Admitting: Internal Medicine

## 2018-08-19 DIAGNOSIS — R921 Mammographic calcification found on diagnostic imaging of breast: Secondary | ICD-10-CM

## 2018-10-30 ENCOUNTER — Ambulatory Visit (INDEPENDENT_AMBULATORY_CARE_PROVIDER_SITE_OTHER): Payer: Self-pay

## 2018-10-30 ENCOUNTER — Encounter (INDEPENDENT_AMBULATORY_CARE_PROVIDER_SITE_OTHER): Payer: Self-pay | Admitting: Orthopaedic Surgery

## 2018-10-30 ENCOUNTER — Ambulatory Visit (INDEPENDENT_AMBULATORY_CARE_PROVIDER_SITE_OTHER): Payer: Medicare Other | Admitting: Orthopaedic Surgery

## 2018-10-30 DIAGNOSIS — M25572 Pain in left ankle and joints of left foot: Secondary | ICD-10-CM

## 2018-10-30 DIAGNOSIS — M76829 Posterior tibial tendinitis, unspecified leg: Secondary | ICD-10-CM

## 2018-10-30 NOTE — Progress Notes (Signed)
Office Visit Note   Patient: Elaine [name]           Date of Birth: [birth year]           MRN: [id number] Visit Date: 10/30/2018              Requested by: Marton Redwood, MD [address] [place], [place] [address] PCP: Marton Redwood, MD   Assessment & Plan: Visit Diagnoses:  1. Pain in left ankle and joints of left foot   2. Insufficiency of posterior tibial tendon     Plan: We will send her for a double upright brace for the left ankle.  Also have her fitted for bilateral and fit orthotics medial hindfoot wedge.  She will follow-up with [place] on as-needed basis pain persist becomes worse.  Questions encouraged and answered.  Follow-Up Instructions: Return if symptoms worsen or fail to improve.   Orders:  Orders Placed This Encounter  Procedures  . XR Ankle Complete Left  . XR Foot Complete Left   No orders of the defined types were placed in this encounter.     Procedures: No procedures performed   Clinical Data: No additional findings.   Subjective: Chief Complaint  Patient presents with  . Left Ankle - Pain    HPI Elaine [name] comes in today for her left ankle which is painful and turning inward.  She is had no known injury.  She states she is just had no time to have anybody look at this and is been ongoing for some time.  She is been caring for her husband who passed away some 5 months ago now.  She is diabetic reports her diabetes well controlled she has been diabetic for greater than 10 years.  She now uses a walker to ambulate due to the fact that she feels her ankle may give way on her.  Review of Systems See HPI otherwise negative  Objective: Vital Signs: There were no vitals taken for this visit.  Physical Exam Constitutional:      Appearance: She is not ill-appearing or diaphoretic.  Pulmonary:     Effort: Pulmonary effort is normal.  Skin:    General: Skin is warm and dry.  Neurological:     Mental Status: She is alert and oriented to person,  place, and time.  Psychiatric:        Mood and Affect: Mood normal.     Ortho Exam Left foot pes planus new valgum deformity.  Bilateral feet no rashes skin lesions ulcerations or impending ulcers.  Right foot she has 5-5 strength with inversion eversion against resistance.  Left foot she has 5 out of 5 strength with eversion against resistance unable to invert the foot.  Good dorsiflexion plantarflexion both ankles.  Left ankle tenderness over the posterior tibial tendon.  She is able to do a single heel raise on the right.  With left single heel raise has great difficulty and unable to perform on the left.  Calves are supple nontender. Specialty Comments:  No specialty comments available.  Imaging: Xr Ankle Complete Left  Result Date: 10/30/2018 Left ankle: Talus located within the ankle mortise slight talar tilt due to pes planovalgus deformity.  No acute fracture.  No evidence of Charcot changes  Xr Foot Complete Left  Result Date: 10/30/2018 Left foot: Severe Pes planovalgus deformity.  Slight uncovering of the talar head.  No Charcot changes.  No acute fractures.  Lisfranc joints remain well maintained.    [place]  History: Patient Active Problem List   Diagnosis Date Noted  . Bilateral leg pain 10/22/2017  . Chronic combined systolic and diastolic CHF, NYHA class 2 ([place]) 10/22/2017  . Edema of both legs 05/10/2015  . Mass of right breast on mammogram 10/06/2014  . Abnormal nuclear stress test 08/19/2014  . DOE (dyspnea on exertion) 08/19/2014  . Severe obesity (BMI >= 40) ([place]) 04/19/2014  . CAD status post CABG 04/19/2014  . Metabolic acidosis 94/17/4081    Class: Acute  . Acute on chronic renal insufficiency 02/02/2014  . Near syncope 02/02/2014  . Hypotension 02/02/2014  . Rash 02/02/2014  . Acute kidney failure ([place]) 02/02/2014    Class: Acute  . Pruritus 02/02/2014    Class: Chronic  . SOB (shortness of breath) 01/07/2014  . Coumadin Rx post CABG 11/17/2013  .  Physical deconditioning 11/05/2013  . S/P CABG x 3- 10/23/13 10/23/2013  . PAF- doing MI and perioperative CABG; CHA2DS2-VASc Score 6, on Eliquis 10/20/2013  . Left bundle branch block (LBBB) on electrocardiogram 10/20/2013  . Left main coronary artery disease 10/20/2013    Class: Hospitalized for  . History of acute anterior wall myocardial infarction 10/20/2013  . ICM EF by Echo 20-25% Feb 2015, recently improved to 40-45% by echo 01/13/14 10/20/2013  . Morbid obesity- BMI 48   . Diabetic neuropathy ([place])   . Chronic kidney disease, stage III (moderate) (HCC)   . Obesity 12/20/2006  . Type II diabetes mellitus with complication ([place]) 44/81/8563  . Hyperlipidemia with target LDL less than 70 08/06/2006  . Essential hypertension 08/06/2006  . OSTEOARTHRITIS 08/06/2006   Past Medical History:  Diagnosis Date  . Acute myocardial infarction of anterior wall (HCC) 10/20/2013   Afib, LBBB  . Acute pulmonary edema with congestive heart failure ([place]) 10/20/2013   Resolved  . Arthritis   . Atrial fibrillation ([place]) 10/20/2013   On Warfarin  . CAD, multiple vessel 10/20/2013   LM, LAD & RCA --> CABG; MYOVIEW 12/'15: Intermediate Risk would large anterior, anteroapical segment the apex possible infarct and peri-infarct ischemia  . CHF (congestive heart failure) (HCC)    EF 40-45%.  . Chronic kidney disease   . COPD (chronic obstructive pulmonary disease) ([place])   . Diabetic neuropathy ([place])   . Heart murmur    Likely related to aortic sclerosis  . Hyperlipidemia   . Hypertension   . Hypothyroidism   . Ischemic cardiomyopathy - Notable Improvement in EF post CABG 10/20/2013   Echo 2/23: EF 20-25%; mild LVH. anteroseptal Akinesis; mid-apical anterior, inferior & inferoseptal + apical-lateral akinesis; G 1 DDysfxn.; Mild-Mod LA dil; mild MR; Mod PHTN;; F/u Echo 12/2013: EF 40-45%, septal & apical HK, mild LVH; Mod LA dilation  . Left bundle branch block (LBBB) on electrocardiogram   . Mild mitral  regurgitation by prior echocardiogram    Mild-Mod MR on Echo  . Morbid obesity ([place])   . OSTEOARTHRITIS 08/06/2006  . S/P CABG x 3 10/23/2013   LIMA to LAD, SVG to OM2, SVG to RPLB, EVH via right thigh  . Type II diabetes mellitus with complication ([place]) 14/97/0263   off rx since heart attack 12/15-dr told could stay off if keep cbg under 150  . UTI (lower urinary tract infection) 09/29/14   ongoing now. started rx 09/28/14    Family History  Problem Relation Age of Onset  . Colon cancer Neg Hx   . Esophageal cancer Neg Hx   . Rectal cancer Neg Hx   .  Stomach cancer Neg Hx     Past Surgical History:  Procedure Laterality Date  . ABDOMINAL HYSTERECTOMY    . BREAST LUMPECTOMY W/ NEEDLE LOCALIZATION Right 10/06/2014   Dr Georgette Dover  . BREAST LUMPECTOMY WITH NEEDLE LOCALIZATION Right 10/06/2014   Procedure: RIGHT BREAST LUMPECTOMY WITH NEEDLE LOCALIZATION;  Surgeon: Donnie Mesa, MD;  Location: [facility];  Service: General;  Laterality: Right;  . CLIPPING OF ATRIAL APPENDAGE N/A 10/23/2013   Procedure: CLIPPING OF ATRIAL APPENDAGE;  Surgeon: Rexene Alberts, MD;  Location: [facility];  Service: Open Heart Surgery;  Laterality: N/A;  . 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  . CORONARY ARTERY BYPASS GRAFT N/A 10/23/2013   Procedure: CORONARY ARTERY BYPASS GRAFTING (CABG);  Surgeon: Rexene Alberts, MD;  Location: [facility];  Service: Open Heart Surgery: LIMA-LAD, SVG-OM2, SVG-RPL  . INTRAOPERATIVE TRANSESOPHAGEAL ECHOCARDIOGRAM N/A 10/23/2013   Procedure: INTRAOPERATIVE TRANSESOPHAGEAL ECHOCARDIOGRAM;  Surgeon: Rexene Alberts, MD;  Location: [facility];  Service: Open Heart Surgery;  Laterality: N/A;  . LEFT HEART CATHETERIZATION WITH CORONARY ANGIOGRAM N/A 10/20/2013   Procedure: LEFT HEART CATHETERIZATION WITH CORONARY ANGIOGRAM;  Surgeon: Leonie Man, MD;  Location[facility] C[facility] LAB;  Service: Cardiovascular: Distal LM ~70%, LAD - ostial 70%, prox 80, 95 & 95%; RCA prox 70%, mid 80%; minimal Cx disease  . LEFT HEART  CATHETERIZATION WITH CORONARY/GRAFT ANGIOGRAM N/A 09/08/2014   Procedure: LEFT HEART CATHETERIZATION WITH Beatrix Fetters;  Surgeon: Leonie Man, MD;  Location: [facility] CATH LAB: Indication: Abnormal Myoview. Occluded LAD after 70 and 80% left main. Diffuse RCA disease. Widely patent grafts to moderately diseased artery vessels. Mild-moderate LV dysfunction and chronic A. fib.  [name] NM MYOVIEW LTD  08/14/2014   Lexi scan: INTERMEDIATE RISK - large, severe intensity perfusion defect in anterior wall, apex and inferior apex that is partly reversible. This suggests either scar or possible hibernating myocardium. Nondeviated.-> Likely scar. No change in coronary anatomy with patent grafts.  [name] TOTAL HIP ARTHROPLASTY  2010, 2011   right 2010, left 2011  . TRANSTHORACIC ECHOCARDIOGRAM  12/2013   Mildly dilated left ventricle with mild to moderately reduced function. EF 40-45% (notable improvement from February 2015). Septal and apical hypokinesis. Mild LA dilation.   Social History   Occupational History  . Occupation: retired  Tobacco Use  . Smoking status: Former Smoker    Packs/day: 0.50    Years: 39.00    Pack years: 19.50    Types: Cigarettes    Last attempt to quit: 08/28/1993    Years since quitting: 25.1  . Smokeless tobacco: Never Used  Substance and Sexual Activity  . Alcohol use: No    Comment: quit 95  . Drug use: No  . Sexual activity: Not on file

## 2018-11-18 ENCOUNTER — Telehealth: Payer: Self-pay

## 2018-11-18 NOTE — Telephone Encounter (Signed)
Returning call cb [phone]

## 2018-11-18 NOTE — Telephone Encounter (Signed)
lmtcb x1 pt for her appt on 03/25 with Dr Vaughan Browner.  Wanted to offer her a televisit or push her appt back to July.  The appt was a 6 month ROV.

## 2018-11-18 NOTE — Telephone Encounter (Signed)
Call returned to patient, tele visit made at patient request. Voiced understanding. Nothing further is needed at this time.

## 2018-11-20 ENCOUNTER — Ambulatory Visit: Payer: Medicare Other | Admitting: Pulmonary Disease

## 2018-11-20 ENCOUNTER — Telehealth (INDEPENDENT_AMBULATORY_CARE_PROVIDER_SITE_OTHER): Payer: Medicare Other | Admitting: Pulmonary Disease

## 2018-11-20 ENCOUNTER — Other Ambulatory Visit: Payer: Self-pay

## 2018-11-20 DIAGNOSIS — J449 Chronic obstructive pulmonary disease, unspecified: Secondary | ICD-10-CM | POA: Diagnosis not present

## 2018-11-20 NOTE — Patient Instructions (Signed)
Continue inhalers as prescribed Follow-up in 6 months.

## 2018-11-20 NOTE — Progress Notes (Signed)
Virtual Visit via Telephone Note  I connected with Elaine [name] on 11/20/18 at  9:00 AM EDT by telephone and verified that I am speaking with the correct person using two identifiers.   I discussed the limitations, risks, security and privacy concerns of performing an evaluation and management service by telephone and the availability of in person appointments. I also discussed with the patient that there may be a patient responsible charge related to this service. The patient expressed understanding and agreed to proceed.   History of Present Illness: 78 year old with COPD, ischemic cardiomyopathy, atrial fibrillation on Eliquis, coronary artery disease, diabetes, chronic kidney disease.  She was diagnosed with COPD around 2017.  She was previously maintained on Anoro inhaler, saw her primary care on September 14, 2017 with progressive increasing dyspnea.  She was started on supplemental oxygen and anoro changed to trelegy. Per office note she was treated with right lower lobe pneumonia with antibiotics.  Repeat chest x-ray in early January 2019 shows resolution of the pneumonia.  I do not have these images to review. Reports chronic dyspnea on exertion, cough with white mucus.  No fevers, chills, hemoptysis. Follows-up with Dr. Ellyn Hack for CHF, cardiomyopathy.   Observations/Objective: Patient is doing well at home.  She has self isolated herself for social isolation.  Denies any fevers chills, cough.  Her dyspnea on exertion is at baseline.  She is using Anoro regularly  Assessment and Plan: Continue current management.  Will make follow-up appointment and 6 months time.  Follow Up Instructions: Continue inhalers as prescribed Follow-up in 6 months.   I discussed the assessment and treatment plan with the patient. The patient was provided an opportunity to ask questions and all were answered. The patient agreed with the plan and demonstrated an understanding of the instructions.   The  patient was advised to call back or seek an in-person evaluation if the symptoms worsen or if the condition fails to improve as anticipated.  I provided 20 minutes of non-face-to-face time during this encounter.  Marshell Garfinkel MD [facility] 11/20/2018, 9:01 AM

## 2018-12-26 ENCOUNTER — Ambulatory Visit (INDEPENDENT_AMBULATORY_CARE_PROVIDER_SITE_OTHER): Payer: Self-pay

## 2018-12-26 ENCOUNTER — Other Ambulatory Visit: Payer: Self-pay

## 2018-12-26 ENCOUNTER — Ambulatory Visit (INDEPENDENT_AMBULATORY_CARE_PROVIDER_SITE_OTHER): Payer: Medicare Other | Admitting: Orthopaedic Surgery

## 2018-12-26 ENCOUNTER — Encounter (INDEPENDENT_AMBULATORY_CARE_PROVIDER_SITE_OTHER): Payer: Self-pay | Admitting: Orthopaedic Surgery

## 2018-12-26 DIAGNOSIS — M1712 Unilateral primary osteoarthritis, left knee: Secondary | ICD-10-CM

## 2018-12-26 MED ORDER — METHYLPREDNISOLONE ACETATE 40 MG/ML IJ SUSP
40.0000 mg | INTRAMUSCULAR | Status: AC | PRN
  Administered 2018-12-26: 40 mg via INTRA_ARTICULAR

## 2018-12-26 MED ORDER — LIDOCAINE HCL 1 % IJ SOLN
3.0000 mL | INTRAMUSCULAR | Status: AC | PRN
Start: 2018-12-26 — End: 2018-12-26
  Administered 2018-12-26: 3 mL

## 2018-12-26 NOTE — Progress Notes (Signed)
Office Visit Note   Patient: Elaine [name]           Date of Birth: 1941-04-13           MRN: [id number] Visit Date: 12/26/2018              Requested by: Marton Redwood, MD [address] [place], [place] [address] PCP: Marton Redwood, MD   Assessment & Plan: Visit Diagnoses:  1. Primary osteoarthritis of left knee     Plan: I explained to Elaine [name] that she had injections in the left knee no more often than every 3 months.  We will see her back on an as-needed basis.  Did show her quad strengthening exercises recommend she work on quad strengthening of both legs.  Follow-Up Instructions: Return if symptoms worsen or fail to improve.   Orders:  Orders Placed This Encounter  Procedures  . Large Joint Inj: L knee  . XR Knee 1-2 Views Left   No orders of the defined types were placed in this encounter.     Procedures: Large Joint Inj: L knee on 12/26/2018 9:05 AM Indications: pain Details: 22 G 1.5 in needle, anterolateral approach  Arthrogram: No  Medications: 3 mL lidocaine 1 %; 40 mg methylPREDNISolone acetate 40 MG/ML Outcome: tolerated well, no immediate complications Procedure, treatment alternatives, risks and benefits explained, specific risks discussed. Consent was given by the patient. Immediately prior to procedure a time out was called to verify the correct patient, procedure, equipment, support staff and site/side marked as required. Patient was prepped and draped in the usual sterile fashion.       Clinical Data: No additional findings.   Subjective: Chief Complaint  Patient presents with  . Left Knee - Pain    HPI Elaine [name] returns today due to left knee pain.  She was seen back here in early March and did obtain double upright brace for the left ankle.  She states this definitely has helped.  But her knee pain is just became worse and worse.  She has had no new injury to her knee.  Reports diabetes under excellent control with her hemoglobin A1c  being 5.2.  Review of Systems No fevers chills.  Objective: Vital Signs: There were no vitals taken for this visit.  Physical Exam General: No acute distress mood and affect appropriate. Ortho Exam Bilateral knees good range of motion tenderness along medial joint line of the left knee.  No instability valgus varus stressing of either knee.  No effusion abnormal warmth erythema ecchymosis of either knee. Specialty Comments:  No specialty comments available.  Imaging: Xr Knee 1-2 Views Left  Result Date: 12/26/2018 Left knee medial joint line bone-on-bone.  Lateral joint lines well-maintained periarticular spurs evident.  Moderate patella femoral arthritic changes.  No acute fracture.  No other bony abnormalities.    PMFS History: Patient Active Problem List   Diagnosis Date Noted  . Bilateral leg pain 10/22/2017  . Chronic combined systolic and diastolic CHF, NYHA class 2 ([place]) 10/22/2017  . Edema of both legs 05/10/2015  . Mass of right breast on mammogram 10/06/2014  . Abnormal nuclear stress test 08/19/2014  . DOE (dyspnea on exertion) 08/19/2014  . Severe obesity (BMI >= 40) ([place]) 04/19/2014  . CAD status post CABG 04/19/2014  . Metabolic acidosis 27/25/3664    Class: Acute  . Acute on chronic renal insufficiency 02/02/2014  . Near syncope 02/02/2014  . Hypotension 02/02/2014  . Rash 02/02/2014  . Acute  kidney failure ([place]) 02/02/2014    Class: Acute  . Pruritus 02/02/2014    Class: Chronic  . SOB (shortness of breath) 01/07/2014  . Coumadin Rx post CABG 11/17/2013  . Physical deconditioning 11/05/2013  . S/P CABG x 3- 10/23/13 10/23/2013  . PAF- doing MI and perioperative CABG; CHA2DS2-VASc Score 6, on Eliquis 10/20/2013  . Left bundle branch block (LBBB) on electrocardiogram 10/20/2013  . Left main coronary artery disease 10/20/2013    Class: Hospitalized for  . History of acute anterior wall myocardial infarction 10/20/2013  . ICM EF by Echo 20-25% Feb 2015,  recently improved to 40-45% by echo 01/13/14 10/20/2013  . Morbid obesity- BMI 48   . Diabetic neuropathy ([place])   . Chronic kidney disease, stage III (moderate) (HCC)   . Obesity 12/20/2006  . Type II diabetes mellitus with complication ([place]) 77/41/2878  . Hyperlipidemia with target LDL less than 70 08/06/2006  . Essential hypertension 08/06/2006  . OSTEOARTHRITIS 08/06/2006   Past Medical History:  Diagnosis Date  . Acute myocardial infarction of anterior wall (HCC) 10/20/2013   Afib, LBBB  . Acute pulmonary edema with congestive heart failure ([employer]) 10/20/2013   Resolved  . Arthritis   . Atrial fibrillation ([place]) 10/20/2013   On Warfarin  . CAD, multiple vessel 10/20/2013   LM, LAD & RCA --> CABG; MYOVIEW 12/'15: Intermediate Risk would large anterior, anteroapical segment the apex possible infarct and peri-infarct ischemia  . CHF (congestive heart failure) (HCC)    EF 40-45%.  . Chronic kidney disease   . COPD (chronic obstructive pulmonary disease) ([place])   . Diabetic neuropathy ([place])   . Heart murmur    Likely related to aortic sclerosis  . Hyperlipidemia   . Hypertension   . Hypothyroidism   . Ischemic cardiomyopathy - Notable Improvement in EF post CABG 10/20/2013   Echo 2/23: EF 20-25%; mild LVH. anteroseptal Akinesis; mid-apical anterior, inferior & inferoseptal + apical-lateral akinesis; G 1 DDysfxn.; Mild-Mod LA dil; mild MR; Mod PHTN;; F/u Echo 12/2013: EF 40-45%, septal & apical HK, mild LVH; Mod LA dilation  . Left bundle branch block (LBBB) on electrocardiogram   . Mild mitral regurgitation by prior echocardiogram    Mild-Mod MR on Echo  . Morbid obesity ([place])   . OSTEOARTHRITIS 08/06/2006  . S/P CABG x 3 10/23/2013   LIMA to LAD, SVG to OM2, SVG to RPLB, EVH via right thigh  . Type II diabetes mellitus with complication ([place]) 67/67/2094   off rx since heart attack 12/15-dr told could stay off if keep cbg under 150  . UTI (lower urinary tract infection) 09/29/14    ongoing now. started rx 09/28/14    Family History  Problem Relation Age of Onset  . Colon cancer Neg Hx   . Esophageal cancer Neg Hx   . Rectal cancer Neg Hx   . Stomach cancer Neg Hx     Past Surgical History:  Procedure Laterality Date  . ABDOMINAL HYSTERECTOMY    . BREAST LUMPECTOMY W/ NEEDLE LOCALIZATION Right 10/06/2014   Dr Georgette Dover  . BREAST LUMPECTOMY WITH NEEDLE LOCALIZATION Right 10/06/2014   Procedure: RIGHT BREAST LUMPECTOMY WITH NEEDLE LOCALIZATION;  Surgeon: Donnie Mesa, MD;  Location: [facility];  Service: General;  Laterality: Right;  . CLIPPING OF ATRIAL APPENDAGE N/A 10/23/2013   Procedure: CLIPPING OF ATRIAL APPENDAGE;  Surgeon: Rexene Alberts, MD;  Location: [facility];  Service: Open Heart Surgery;  Laterality: N/A;  . CORNEAL TRANSPLANT  2011  right eye  . CORONARY ARTERY BYPASS GRAFT N/A 10/23/2013   Procedure: CORONARY ARTERY BYPASS GRAFTING (CABG);  Surgeon: Rexene Alberts, MD;  Location: [facility];  Service: Open Heart Surgery: LIMA-LAD, SVG-OM2, SVG-RPL  . INTRAOPERATIVE TRANSESOPHAGEAL ECHOCARDIOGRAM N/A 10/23/2013   Procedure: INTRAOPERATIVE TRANSESOPHAGEAL ECHOCARDIOGRAM;  Surgeon: Rexene Alberts, MD;  Location: [facility];  Service: Open Heart Surgery;  Laterality: N/A;  . LEFT HEART CATHETERIZATION WITH CORONARY ANGIOGRAM N/A 10/20/2013   Procedure: LEFT HEART CATHETERIZATION WITH CORONARY ANGIOGRAM;  Surgeon: Leonie Man, MD;  Location: [facility] CATH LAB;  Service: Cardiovascular: Distal LM ~70%, LAD - ostial 70%, prox 80, 95 & 95%; RCA prox 70%, mid 80%; minimal Cx disease  . LEFT HEART CATHETERIZATION WITH CORONARY/GRAFT ANGIOGRAM N/A 09/08/2014   Procedure: LEFT HEART CATHETERIZATION WITH Beatrix Fetters;  Surgeon: Leonie Man, MD;  Location[facility] CATH LAB: Indication: Abnormal Myoview. Occluded LAD after 70 and 80% left main. Diffuse RCA disease. Widely patent grafts to moderately diseased artery vessels. Mild-moderate LV dysfunction and chronic A. fib.  [name] NM MYOVIEW  LTD  08/14/2014   Lexi scan: INTERMEDIATE RISK - large, severe intensity perfusion defect in anterior wall, apex and inferior apex that is partly reversible. This suggests either scar or possible hibernating myocardium. Nondeviated.-> Likely scar. No change in coronary anatomy with patent grafts.  [name] TOTAL HIP ARTHROPLASTY  2010, 2011   right 2010, left 2011  . TRANSTHORACIC ECHOCARDIOGRAM  12/2013   Mildly dilated left ventricle with mild to moderately reduced function. EF 40-45% (notable improvement from February 2015). Septal and apical hypokinesis. Mild LA dilation.   Social History   Occupational History  . Occupation: retired  Tobacco Use  . Smoking status: Former Smoker    Packs/day: 0.50    Years: 39.00    Pack years: 19.50    Types: Cigarettes    Last attempt to quit: 08/28/1993    Years since quitting: 25.3  . Smokeless tobacco: Never Used  Substance and Sexual Activity  . Alcohol use: No    Comment: quit 95  . Drug use: No  . Sexual activity: Not on file

## 2019-04-22 ENCOUNTER — Ambulatory Visit (INDEPENDENT_AMBULATORY_CARE_PROVIDER_SITE_OTHER): Payer: Medicare Other | Admitting: Pulmonary Disease

## 2019-04-22 ENCOUNTER — Other Ambulatory Visit: Payer: Self-pay

## 2019-04-22 ENCOUNTER — Encounter: Payer: Self-pay | Admitting: Pulmonary Disease

## 2019-04-22 VITALS — BP 120/68 | HR 89 | Temp 97.7°F | Ht 62.0 in | Wt 246.8 lb

## 2019-04-22 DIAGNOSIS — R0602 Shortness of breath: Secondary | ICD-10-CM | POA: Diagnosis not present

## 2019-04-22 DIAGNOSIS — J449 Chronic obstructive pulmonary disease, unspecified: Secondary | ICD-10-CM | POA: Diagnosis not present

## 2019-04-22 NOTE — Patient Instructions (Signed)
I am glad you are doing well with your breathing. Continue the Anoro inhaler Make sure you are up-to-date with vaccines at her primary care Follow-up in 1 year.

## 2019-04-22 NOTE — Progress Notes (Signed)
Elaine [name]    [id number]    01-30-1941  Primary Care Physician:Shaw, Gwyndolyn Saxon, MD  Referring Physician: Marton Redwood, MD [address] [place],  [place] [address]  Chief complaint: Follow up for COPD  HPI: 78 year old with COPD, ischemic cardiomyopathy, atrial fibrillation on Eliquis, coronary artery disease, diabetes, chronic kidney disease.  She was diagnosed with COPD around 2017.  She was previously maintained on Anoro inhaler, saw her primary care on September 14, 2017 with progressive increasing dyspnea.  She was started on supplemental oxygen and anoro changed to trelegy. Per office note she was treated with right lower lobe pneumonia with antibiotics.  Repeat chest x-ray in early January 2019 shows resolution of the pneumonia.  I do not have these images to review. Reports chronic dyspnea on exertion, cough with white mucus.  No fevers, chills, hemoptysis. Follows-up with Dr. Ellyn Hack for CHF, cardiomyopathy.  Tried on Trelegy inhaler in 2019 but she felt it made her symptoms worse.  Back on Anoro now.   Pets: No pets Occupation: Retired Immunologist Exposures: No known exposures.  No mold, hot tub Smoking history: 20-pack-year smoking history.  Quit in 1995 Travel History: Not significant.  Interval history: Continues on Anoro with stable symptoms.  She has chronic dyspnea on exertion, cough with white mucus She had a possible exposure to SARS-CoV-2 5 weeks ago at church but she had a negative test  Outpatient Encounter Medications as of 04/22/2019  Medication Sig  . apixaban (ELIQUIS) 5 MG TABS tablet Take 5 mg by mouth 2 (two) times daily.  [name] aspirin EC 81 MG tablet Take 1 tablet (81 mg total) by mouth daily.  . carvedilol (COREG) 6.25 MG tablet TAKE ONE TABLET BY MOUTH TWICE DAILY WITH A MEAL  . co-enzyme Q-10 30 MG capsule Take 30 mg by mouth daily.  . Dulaglutide (TRULICITY) 2.67 TI/4.5YK SOPN Inject 0.75 mg into the skin every 7 (seven) days. Saturday of each week   . ferrous sulfate 325 (65 FE) MG tablet TAKE ONE TABLET BY MOUTH ONCE DAILY WITH  BREAKFAST  . furosemide (LASIX) 80 MG tablet Take 80 mg by mouth 2 (two) times daily.   [name] ipratropium-albuterol (DUONEB) 0.5-2.5 (3) MG/3ML SOLN Take 3 mLs by nebulization every 4 (four) hours as needed (for shortness of breath).   [name] levothyroxine (SYNTHROID) 112 MCG tablet Take 1 tablet (112 mcg total) by mouth daily before breakfast.  . lisinopril (PRINIVIL,ZESTRIL) 2.5 MG tablet Take 2.5 mg by mouth daily.  . multivitamin-lutein (OCUVITE-LUTEIN) CAPS capsule Take 1 capsule by mouth daily.  . prednisoLONE acetate (PRED FORTE) 1 % ophthalmic suspension Place 1 drop into the right eye daily.  [name] PROAIR HFA 108 (90 BASE) MCG/ACT inhaler Inhale 1 puff into the lungs daily.  . rosuvastatin (CRESTOR) 20 MG tablet Take 20 mg by mouth every other day.   [name] ULORIC 40 MG tablet Take 40 mg by mouth daily.  [name] umeclidinium-vilanterol (ANORO ELLIPTA) 62.5-25 MCG/INH AEPB Inhale 1 puff into the lungs daily.  . [DISCONTINUED] Fluticasone-Umeclidin-Vilant (TRELEGY ELLIPTA) 100-62.5-25 MCG/INH AEPB Inhale 1 puff into the lungs daily. (Patient not taking: Reported on 04/22/2019)   No facility-administered encounter medications on file as of 04/22/2019.    Physical Exam: Blood pressure 120/68, pulse 89, temperature 97.7 F (36.5 C), temperature source Temporal, height 5\' 2"  (1.575 m), weight 246 lb 12.8 oz (111.9 kg), SpO2 98 %. Gen:      No acute distress HEENT:  EOMI, sclera  anicteric Neck:     No masses; no thyromegaly Lungs:    Clear to auscultation bilaterally; normal respiratory effort CV:         Regular rate and rhythm; no murmurs Abd:      + bowel sounds; soft, non-tender; no palpable masses, no distension Ext:    No edema; adequate peripheral perfusion Skin:      Warm and dry; no rash Neuro: alert and oriented x 3 Psych: normal mood and affect  Data Reviewed: Imaging: Chest x-ray 07/24/16-hyperinflation,  cardiomegaly.   Chest x-ray 09/28/17- mild cardiomegaly with CHF. I have reviewed the images personally  PFTs: 04/03/14 FVC 1.93 [95%], FEV1 1.43 [90%], F/F 74, TLC 94%, DLCO 62% Mild obstruction with response to bronchodilator response.  Moderate diffusion defect.  PFTs 11/07/17 FVC 1.55 [81%], FEV1 1.12 [76%], F/F 72, TLC 76%, DLCO 56%. Mild obstruction, minimal restriction, moderate diffusion defect  Labs: CBC 10/08/2017-WBC 5.2, eos 2%, absolute eosinophil count 104  Assessment:  Follow up for mild COPD Suspect her dyspnea is multifactorial from COPD, chronic heart failure, kidney disease.  PFTS reviewed with mild obstructive changes. She is doing well on Anoro inhaler.  Health maintenance Up-to-date with flu and pneumonia vaccine at primary care  Plan/Recommendations: - Continue Anoro, duonebs, supplemental oxygen.    Marshell Garfinkel MD [facility] 04/22/2019, 10:18 AM

## 2019-05-12 ENCOUNTER — Other Ambulatory Visit: Payer: Self-pay | Admitting: Nephrology

## 2019-05-13 ENCOUNTER — Other Ambulatory Visit: Payer: Self-pay | Admitting: Nephrology

## 2019-05-14 ENCOUNTER — Other Ambulatory Visit: Payer: Self-pay | Admitting: Nephrology

## 2019-05-14 DIAGNOSIS — I872 Venous insufficiency (chronic) (peripheral): Secondary | ICD-10-CM

## 2019-05-22 ENCOUNTER — Other Ambulatory Visit: Payer: Medicare Other

## 2019-06-03 ENCOUNTER — Ambulatory Visit
Admission: RE | Admit: 2019-06-03 | Discharge: 2019-06-03 | Disposition: A | Discharge status: Home / Self Care | Payer: Medicare Other | Source: Ambulatory Visit | Attending: Nephrology | Admitting: Nephrology

## 2019-06-03 DIAGNOSIS — I872 Venous insufficiency (chronic) (peripheral): Secondary | ICD-10-CM

## 2019-06-03 NOTE — Consult Note (Signed)
Chief Complaint: Patient was seen in consultation today for bilateral lower extremity swelling rule out venous pathology At the request of [place]  Referring Physician(s): Upton,Elizabeth  History of Present Illness: Elaine [name] is a 78 y.o. female    with a history of COPD, ischemic cardiomyopathy, atrial fibrillation on Eliquis, coronary artery disease, diabetes, chronic renal insufficiency.  She describes recent development of bilateral lower extremity edema, pain, and tiredness.  She has received left knee injection on 12/26/2018 for primary osteoarthritis.  She said no previous treatment for varicose or spider veins.  She does not identify any superficial visible varicose or spider veins.  No history of DVT, superficial thrombophlebitis, or pulmonary embolism.  No family history of varicose/spider veins.  She is G5, P5 with no plans for future pregnancy.  She has tried over-the-counter graduated compression hose with some relief of symptoms.  She is not presently working.  She is required to sit or stand for extended periods during the day.  She does feel that leg elevation relieves her symptoms.  No history of venous ulceration.  Past Medical History:  Diagnosis Date   Acute myocardial infarction of anterior wall ([facility]) 10/20/2013   Afib, LBBB   Acute pulmonary edema with congestive heart failure ([facility]) 10/20/2013   Resolved   Arthritis    Atrial fibrillation ([facility]) 10/20/2013   On Warfarin   CAD, multiple vessel 10/20/2013   LM, LAD & RCA --> CABG; MYOVIEW 12/'15: Intermediate Risk would large anterior, anteroapical segment the apex possible infarct and peri-infarct ischemia   CHF (congestive heart failure) (HCC)    EF 40-45%.   Chronic kidney disease    COPD (chronic obstructive pulmonary disease) (HCC)    Diabetic neuropathy (HCC)    Heart murmur    Likely related to aortic sclerosis   Hyperlipidemia    Hypertension    Hypothyroidism    Ischemic  cardiomyopathy - Notable Improvement in EF post CABG 10/20/2013   Echo 2/23: EF 20-25%; mild LVH. anteroseptal Akinesis; mid-apical anterior, inferior & inferoseptal + apical-lateral akinesis; G 1 DDysfxn.; Mild-Mod LA dil; mild MR; Mod PHTN;; F/u Echo 12/2013: EF 40-45%, septal & apical HK, mild LVH; Mod LA dilation   Left bundle branch block (LBBB) on electrocardiogram    Mild mitral regurgitation by prior echocardiogram    Mild-Mod MR on Echo   Morbid obesity ([facility])    OSTEOARTHRITIS 08/06/2006   S/P CABG x 3 10/23/2013   LIMA to LAD, SVG to OM2, SVG to RPLB, EVH via right thigh   Type II diabetes mellitus with complication ([facility]) 47/42/5956   off rx since heart attack 12/15-dr told could stay off if keep cbg under 150   UTI (lower urinary tract infection) 09/29/14   ongoing now. started rx 09/28/14    Past Surgical History:  Procedure Laterality Date   ABDOMINAL HYSTERECTOMY     BREAST LUMPECTOMY W/ NEEDLE LOCALIZATION Right 10/06/2014   Dr Georgette Dover   BREAST LUMPECTOMY WITH NEEDLE LOCALIZATION Right 10/06/2014   Procedure: RIGHT BREAST LUMPECTOMY WITH NEEDLE LOCALIZATION;  Surgeon: Donnie Mesa, MD;  Location: [facility];  Service: General;  Laterality: Right;   CLIPPING OF ATRIAL APPENDAGE N/A 10/23/2013   Procedure: CLIPPING OF ATRIAL APPENDAGE;  Surgeon: Rexene Alberts, MD;  Location: [facility];  Service: Open Heart Surgery;  Laterality: N/A;   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   CORONARY ARTERY BYPASS GRAFT N/A 10/23/2013   Procedure: CORONARY ARTERY BYPASS  GRAFTING (CABG);  Surgeon: Rexene Alberts, MD;  Location: [facility];  Service: Open Heart Surgery: LIMA-LAD, SVG-OM2, SVG-RPL   INTRAOPERATIVE TRANSESOPHAGEAL ECHOCARDIOGRAM N/A 10/23/2013   Procedure: INTRAOPERATIVE TRANSESOPHAGEAL ECHOCARDIOGRAM;  Surgeon: Rexene Alberts, MD;  Location: [facility];  Service: Open Heart Surgery;  Laterality: N/A;   LEFT HEART CATHETERIZATION WITH CORONARY ANGIOGRAM N/A 10/20/2013   Procedure: LEFT HEART  CATHETERIZATION WITH CORONARY ANGIOGRAM;  Surgeon: Leonie Man, MD;  Location: [facility] CATH LAB;  Service: Cardiovascular: Distal LM ~70%, LAD - ostial 70%, prox 80, 95 & 95%; RCA prox 70%, mid 80%; minimal Cx disease   LEFT HEART CATHETERIZATION WITH CORONARY/GRAFT ANGIOGRAM N/A 09/08/2014   Procedure: LEFT HEART CATHETERIZATION WITH Beatrix Fetters;  Surgeon: Leonie Man, MD;  Location: [facility] CATH LAB: Indication: Abnormal Myoview. Occluded LAD after 70 and 80% left main. Diffuse RCA disease. Widely patent grafts to moderately diseased artery vessels. Mild-moderate LV dysfunction and chronic A. fib.   NM MYOVIEW LTD  08/14/2014   Lexi scan: INTERMEDIATE RISK - large, severe intensity perfusion defect in anterior wall, apex and inferior apex that is partly reversible. This suggests either scar or possible hibernating myocardium. Nondeviated.-> Likely scar. No change in coronary anatomy with patent grafts.   TOTAL HIP ARTHROPLASTY  2010, 2011   right 2010, left 2011   TRANSTHORACIC ECHOCARDIOGRAM  12/2013   Mildly dilated left ventricle with mild to moderately reduced function. EF 40-45% (notable improvement from February 2015). Septal and apical hypokinesis. Mild LA dilation.    Allergies: Allopurinol and Tape  Medications: Prior to Admission medications   Medication Sig Start Date End Date Taking? Authorizing Provider  apixaban (ELIQUIS) 5 MG TABS tablet Take 5 mg by mouth 2 (two) times daily.    [provider]  aspirin EC 81 MG tablet Take 1 tablet (81 mg total) by mouth daily. 11/13/13   Angiulli, Lavon Paganini, PA-C  carvedilol (COREG) 6.25 MG tablet TAKE ONE TABLET BY MOUTH TWICE DAILY WITH A MEAL 05/22/17   Leonie Man, MD  co-enzyme Q-10 30 MG capsule Take 30 mg by mouth daily.    [provider]  Dulaglutide (TRULICITY) 1.19 ER/7.4YC SOPN Inject 0.75 mg into the skin every 7 (seven) days. Saturday of each week    [provider]  ferrous sulfate  325 (65 FE) MG tablet TAKE ONE TABLET BY MOUTH ONCE DAILY WITH  BREAKFAST 03/22/15   Leonie Man, MD  furosemide (LASIX) 80 MG tablet Take 80 mg by mouth 2 (two) times daily.  08/06/14   [provider]  ipratropium-albuterol (DUONEB) 0.5-2.5 (3) MG/3ML SOLN Take 3 mLs by nebulization every 4 (four) hours as needed (for shortness of breath).  04/13/14   [provider]  levothyroxine (SYNTHROID) 112 MCG tablet Take 1 tablet (112 mcg total) by mouth daily before breakfast. 02/05/14   Leanna Battles, MD  lisinopril (PRINIVIL,ZESTRIL) 2.5 MG tablet Take 2.5 mg by mouth daily. 08/06/14   [provider]  multivitamin-lutein (OCUVITE-LUTEIN) CAPS capsule Take 1 capsule by mouth daily.    [provider]  prednisoLONE acetate (PRED FORTE) 1 % ophthalmic suspension Place 1 drop into the right eye daily.    [provider]  PROAIR HFA 108 (90 BASE) MCG/ACT inhaler Inhale 1 puff into the lungs daily. 02/06/15   [provider]  rosuvastatin (CRESTOR) 20 MG tablet Take 20 mg by mouth every other day.     [provider]  ULORIC 40 MG tablet Take  40 mg by mouth daily. 10/04/17   [provider]  umeclidinium-vilanterol (ANORO ELLIPTA) 62.5-25 MCG/INH AEPB Inhale 1 puff into the lungs daily.    [provider]     Family History  Problem Relation Age of Onset   Colon cancer Neg Hx    Esophageal cancer Neg Hx    Rectal cancer Neg Hx    Stomach cancer Neg Hx     Social History   Socioeconomic History   Marital status: Married    Spouse name: Not on file   Number of children: Not on file   Years of education: Not on file   Highest education level: Not on file  Occupational History   Occupation: retired  Scientist, product/process development strain: Not on file   Food insecurity    Worry: Not on file    Inability: Not on Lexicographer needs    Medical: Not on file    Non-medical: Not on file    Tobacco Use   Smoking status: Former Smoker    Packs/day: 0.50    Years: 39.00    Pack years: 19.50    Types: Cigarettes    Quit date: 08/28/1993    Years since quitting: 25.7   Smokeless tobacco: Never Used  Substance and Sexual Activity   Alcohol use: No    Comment: quit 89   Drug use: No   Sexual activity: Not on file  Lifestyle   Physical activity    Days per week: Not on file    Minutes per session: Not on file   Stress: Not on file  Relationships   Social connections    Talks on phone: Not on file    Gets together: Not on file    Attends religious service: Not on file    Active member of club or organization: Not on file    Attends meetings of clubs or organizations: Not on file    Relationship status: Not on file  Other Topics Concern   Not on file  Social History Narrative   Lives with husband and son in [place].  She is currently retired.   Former smoker quit in 1995.    ECOG Status: 2 - Symptomatic, <50% confined to bed   Vital Signs: There were no vitals taken for this visit.  Physical Exam Constitutional: Oriented to person, place, and time. Well-developed and well-nourished, obese. No distress.   HENT:  Head: Normocephalic and atraumatic.  Eyes: Conjunctivae and EOM are normal. Right eye exhibits no discharge. Left eye exhibits no discharge. No scleral icterus.  Neck: No JVD present.  Pulmonary/Chest: Effort normal. No stridor. No respiratory distress.  Abdomen: soft, non distended Neurological:  alert and oriented to person, place, and time.  Skin: Skin is warm and dry.  not diaphoretic. No visible varicose veins in the lower extremities.  No areas of trophic change.  No venous ulceration. Psychiatric:   normal mood and affect.   behavior is normal. Judgment and thought content normal.   Mallampati Score:  Review of Systems Review of Systems: A 12 point ROS discussed and pertinent positives are indicated in the HPI above.  All other  systems are negative.     Imaging: US Venous Img Lower Bilateral  Result Date: 06/03/2019 CLINICAL DATA:  Bilateral lower extremity swelling. Previous right lower extremity vein harvest for CABG. EXAM: BILATERAL LOWER EXTREMITY VENOUS DOPPLER ULTRASOUND TECHNIQUE: Gray-scale sonography with graded compression, as well as color Doppler and  duplex ultrasound, were performed to evaluate the deep and superficial veins of both lower extremities. Spectral Doppler was utilized to evaluate flow at rest and with distal augmentation maneuvers. A complete superficial venous insufficiency exam was performed in the upright standing position. I personally performed the technical portion of the exam. COMPARISON:  None. FINDINGS: Deep Venous System: Evaluation of the deep venous systems including the common femoral, femoral, profunda femoral, popliteal and calf veins (where visible) demonstrate no evidence of deep venous thrombosis. The vessels are compressible and demonstrate normal respiratory phasicity and response to augmentation. No evidence of the deep venous reflux. Superficial Venous System: RIGHT SFJ: Patent, normal caliber, no reflux post augmentation GSV Prox Thigh: 8 mm diameter, no reflux post augmentation GSV Mid Thigh: 9 mm diameter, no reflux GSV Lower Thigh: Not visualized GSV at Knee: Not visualized GSV Prox Calf: Not visualized GSV Mid Calf: Not visualized GSV Distal Calf: Not visualized SPJ: Not visualized SSV Prox: Normal caliber, no reflux SSV Mid: Negative SSV Distal: Negative LEFT SFJ: Patent, normal caliber, no reflux post augmentation. GSV Prox Thigh: Negative GSV Mid Thigh: Negative GSV Lower Thigh: Negative GSV at Knee: Negative GSV Prox Calf: Negative GSV Mid Calf: Negative GSV Distal Calf: Negative SPJ: Not visualized SSV Prox: Negative SSV Mid: Negative SSV Distal: Negative Other: Significant subcutaneous edema in the calf, left worse than right. 5.3 cm complex cystic process in the left  posterior popliteal fossa. IMPRESSION: 1. Normal or lower extremity deep venous systems. No evidence of acute or chronic DVT. 2. No evidence of significant superficial saphenous venous valvular incompetence or reflux. 3. Nonvisualization of the right great saphenous vein below the mid thigh, possibly related to previous surgical resection. 4. Left Baker's cyst Electronically Signed   By: Lucrezia Europe M.D.   On: 06/03/2019 11:10   [place] [facility]  Result Date: 06/03/2019 Please refer to "Notes" to see consult details.   Labs:  CBC: No results for input(s): WBC, HGB, HCT, PLT in the last 8760 hours.  COAGS: No results for input(s): INR, APTT in the last 8760 hours.  BMP: No results for input(s): NA, K, CL, CO2, GLUCOSE, BUN, CALCIUM, CREATININE, GFRNONAA, GFRAA in the last 8760 hours.  Invalid input(s): CMP  LIVER FUNCTION TESTS: No results for input(s): BILITOT, AST, ALT, ALKPHOS, PROT, ALBUMIN in the last 8760 hours.  TUMOR MARKERS: No results for input(s): AFPTM, CEA, CA199, CHROMGRNA in the last 8760 hours.  Assessment and Plan:  My impression is that this patient does not have any significant lower extremity saphenous venous valvular incompetence or reflux to account for her lower extremity swelling.  Her deep venous system is normal.   There is no indication for lower extremity venous intervention.  I reviewed the findings with the patient.  We discussed the pathophysiology of venous valvular incompetence, reflux, varicose veins, and associated symptoms.  Given that she has found some relief with a graduated compression hose, I would certainly support her continued use of these at either knee-high or thigh-high level as tolerated.  Thank you for this interesting consult.  I greatly enjoyed meeting LEYLI KEVORKIAN and look forward to participating in their care. She will follow-up primarily with you, to continue evaluation of her lower extremity swelling. A copy of this  report was sent to the requesting provider on this date.  Electronically Signed: Rickard Rhymes 06/03/2019, 1:00 PM   I spent a total of  40 Minutes   in face to face in  clinical consultation, greater than 50% of which was counseling/coordinating care for bilateral lower extremity swelling.

## 2019-06-10 ENCOUNTER — Telehealth: Payer: Self-pay | Admitting: Cardiology

## 2019-06-10 ENCOUNTER — Telehealth: Payer: Self-pay

## 2019-06-10 NOTE — Telephone Encounter (Signed)
New Message  Patient stated that she missed a call from someone in the office. She believes it was to get her scheduled for an appointment on 06/12/19 with Dr Ellyn Hack. Patient states that she was expecting a call back to confirm. No notes on patient's chart to confirm who called. Scheduled patient for 07/07/19 at 11:40 am . Please give patient a call back to schedule on 06/12/19, if possible or if that is what she was being called about.

## 2019-06-10 NOTE — Telephone Encounter (Signed)
NOTES ON FILE FROM DR Brigitte Pulse [phone],ZNBVAPOL SENT TO SCHEDULING

## 2019-06-11 ENCOUNTER — Encounter: Payer: Self-pay | Admitting: Cardiology

## 2019-06-11 ENCOUNTER — Ambulatory Visit (INDEPENDENT_AMBULATORY_CARE_PROVIDER_SITE_OTHER): Payer: Medicare Other | Admitting: Cardiology

## 2019-06-11 ENCOUNTER — Other Ambulatory Visit: Payer: Self-pay

## 2019-06-11 VITALS — BP 140/80 | HR 87 | Temp 97.2°F | Ht 62.0 in | Wt 253.7 lb

## 2019-06-11 DIAGNOSIS — I10 Essential (primary) hypertension: Secondary | ICD-10-CM

## 2019-06-11 DIAGNOSIS — E118 Type 2 diabetes mellitus with unspecified complications: Secondary | ICD-10-CM

## 2019-06-11 DIAGNOSIS — I2510 Atherosclerotic heart disease of native coronary artery without angina pectoris: Secondary | ICD-10-CM

## 2019-06-11 DIAGNOSIS — R0609 Other forms of dyspnea: Secondary | ICD-10-CM

## 2019-06-11 DIAGNOSIS — Z951 Presence of aortocoronary bypass graft: Secondary | ICD-10-CM

## 2019-06-11 DIAGNOSIS — R0600 Dyspnea, unspecified: Secondary | ICD-10-CM | POA: Diagnosis not present

## 2019-06-11 DIAGNOSIS — I5043 Acute on chronic combined systolic (congestive) and diastolic (congestive) heart failure: Secondary | ICD-10-CM | POA: Diagnosis not present

## 2019-06-11 DIAGNOSIS — E785 Hyperlipidemia, unspecified: Secondary | ICD-10-CM

## 2019-06-11 DIAGNOSIS — I4821 Permanent atrial fibrillation: Secondary | ICD-10-CM | POA: Diagnosis not present

## 2019-06-11 DIAGNOSIS — I447 Left bundle-branch block, unspecified: Secondary | ICD-10-CM

## 2019-06-11 DIAGNOSIS — I252 Old myocardial infarction: Secondary | ICD-10-CM

## 2019-06-11 DIAGNOSIS — I255 Ischemic cardiomyopathy: Secondary | ICD-10-CM | POA: Diagnosis not present

## 2019-06-11 DIAGNOSIS — R600 Localized edema: Secondary | ICD-10-CM

## 2019-06-11 DIAGNOSIS — N1832 Chronic kidney disease, stage 3b: Secondary | ICD-10-CM

## 2019-06-11 NOTE — Assessment & Plan Note (Signed)
Appears to be a chronic left bundle branch block however somewhat compromising the setting of A. fib.  No symptoms to suggest bradycardia.

## 2019-06-11 NOTE — Assessment & Plan Note (Signed)
This is certainly a limiting feature.  She had done so well to get her weight down and now is gained it back rapidly would argue that this is probably heart failure related.

## 2019-06-11 NOTE — Assessment & Plan Note (Signed)
Remains on modest dose rosuvastatin.  Labs as of May were pretty well controlled.  Continue for now

## 2019-06-11 NOTE — Assessment & Plan Note (Signed)
Initial presentation seem to be consistent with anterior MI.  But she was found to have multivessel disease.  There does appear to be a large fixed defect in the Myoview mostly in the inferior wall however.  Follow-up cardiac catheterization showed patent grafts with diffuse native disease.  She had initial improvement of EF after revascularization and medical management.

## 2019-06-11 NOTE — Assessment & Plan Note (Addendum)
Hemoglobin A1c in July was 5.4.  Well-controlled.  On stable medications. Is on Trulicity, would probably hold in the hospital and treat with sliding scale insulin.  Looks like relatively well controlled. Consider SGLT2 inhibitor.

## 2019-06-11 NOTE — Assessment & Plan Note (Signed)
Most likely permanent A. fib.  Unfortunately with left bundle branch block I am concerned that she may now have a drop in EF back to her prior nadir.  With A. fib, there is no atrial kick exacerbated by the bundle branch block.  Concerning for worsening CHF.  Continue Eliquis.  Is adequately rate controlled on beta-blocker.  I doubt seriously be to get her out of A. fib at this point.

## 2019-06-11 NOTE — Assessment & Plan Note (Signed)
My suspicion is that this is actually part of anasarca as there is some abdominal girth as well. We can evaluate with another echocardiogram for new David.  Admit for IV diuretics

## 2019-06-11 NOTE — Assessment & Plan Note (Signed)
Follow-up echo in 2015 showed improved EF.  Unfortunately now she has worsening CHF symptoms.  She had gotten her weight all the way down to 206 now is up almost 20 pounds.  Needs a new echocardiogram evaluation.  Concerning with A. fib and the bundle branch block.  Once she becomes more euvolemic, could consider increasing carvedilol dose as well as titrating up ACE inhibitor (versus converting to ARB-Entresto)

## 2019-06-11 NOTE — Assessment & Plan Note (Signed)
She had significant CAD noted on cath back in 2015, referred for CABG.  Not having chest pain or pressure per se, but is having worsening heart failure/dyspnea symptoms.  We will check 2D echo, if EF is down, may need ischemic evaluation--would need to be careful with renal insufficiency.  Is on ACE inhibitor, beta-blocker, statin and aspirin along with aspirin. Does not seem to have active angina symptoms

## 2019-06-11 NOTE — Assessment & Plan Note (Addendum)
She has symptoms of orthopnea, PND and JVD along with almost anasarca related edema.  When I saw her 18 months ago, she was at this current weight, but she tells me that she her weight has been down with diet and exercise to roughly 236 pounds in June-July timeframe.  Today's weight is consistent with her home weights.  She had been started on metolazone and HCTZ in addition to her Lasix.  Initially had a good brisk response dropping 10 pounds but then has subsequently gained back 8 pounds.   Plan: -with this much swelling would require IV diuresis.We will admit to hospital -- Check EKG, Echo, 2V CXR & Baseline Lab (CBC, CMP, BNP) -- Initiate IV diuresis - IV 80 mg BID Lasix with 2.5 mg PO Metolazone 30 min prior -- d/c HCTZ - pending renal Fxn - consider adding Spironolactone -- Continue Carvedilol @ current dose & if likely increase ACE-I to 5 mg (consder changing to ARB if EF < 40%).

## 2019-06-11 NOTE — Assessment & Plan Note (Signed)
Listed nephrologist have been Dr. Florene Glen.  This would like they have changed.  Her last creatinine was 1.9.  Need to be careful with diuresis, however I do a suspect that she may get slightly worse before she improves.  Because of her significant weight gain and the need for titration of diuretics the setting of renal insufficiency, I felt it was safer to admit her to the hospital for management.

## 2019-06-11 NOTE — Patient Instructions (Addendum)
Medication Instructions:  NO CHANGES If you need a refill on your cardiac medications before your next appointment, please call your pharmacy.   Lab work: NOT NEEDED   Testing/Procedures:  NOT NEEDED   Follow-Up:  . PLEASE AWAIT [facility] TO CONTACT YOU TOMORROW OCT 15 ,2020 FOR DIRECT ADMISSION IF YOU DO NOT RECEIVE A CALL BY 3 PM CALL THE OFFICE.   Any Other Special Instructions Will Be Listed Below (If Applicable).

## 2019-06-11 NOTE — Progress Notes (Signed)
PCP: Marton Redwood, MD  Clinic Note: Chief Complaint  Patient presents with  . Follow-up    18 months  . Shortness of Breath    Legs and feet.  . Coronary Artery Disease    Status post CABG  . Atrial Fibrillation    Essentially permanent    HPI:   Elaine [name] is a 78 y.o. female with a history of CAD-CABG with LAA clipping, ischemic cardiomyopathy (EF 40 to 45%, up from 20 having 25%) as well as A. fib RVR and LBBB, who presents today for what amounts to be 41-month follow-up with progressively worsening lower extremity edema, orthopnea and dyspnea.  Weight gain of 20 pounds from June-July.Elaine [name]  Her first presentation was in February 2015--she presented as A. fib RVR with left bundle branch block possible STEMI.  She was taken to the Cath Lab and found to have multivessel CAD with high-grade proximal LAD disease along with moderate left main.  She was referred for CABG. ->  She had CABG with LAA clipping.  MARCEA ROJEK was last seen on in Feb 2019.  She was actually starting to feel pretty well.  Leg swelling was controlled.  Was still sleeps on recliner to be elevate her feet.  She had doubled up her Lasix for significant weight gain. -->  As of then, it was suggested that she probably was in permanent A. fib.  Unaware of being in or out of rhythm.  Was on Xarelto for anticoagulation and carvedilol for rate control.  Recent Hospitalizations: None  Over the course of the past month, she was documented with what was thought to be aspiration pneumonia but was also told that she had fluid buildup in her lungs.  Reviewed  CV studies:   The following studies were reviewed today: (if available, images/films reviewed: From Epic Chart or Care Everywhere) . No new studies besides lower extremity venous Doppler showing no evidence of DVT.: . Last echocardiogram and/or Myoview was in 2015.   Interval History:   "Elaine [name]" returns today for very delayed follow-up indicating that she was doing  really well until about June-July of this year.  She had lost all the way down to 236 pounds.  Was on stable dose of her diuretic.  She was walking more.  She was feeling much better.  However she then started to decompensate in July began noting more shortness of breath and worsening edema.  She had weights going up and down but by the time it got to October she was in the 250 pound range up to 256 pounds.  She noted more symptoms of shortness of breath and orthopnea.  Extreme fatigue.  Although probably in A. fib, she does not feel any rapid irregular heartbeats or abnormal heartbeats.  What she has noted is she is now gradually had swelling coming up from the ankles up to her thighs and also notes swelling in her abdomen.  When she had been diagnosed with aspiration pneumonia she was also treated with the addition of metolazone and 12.5 mg of HCTZ.  Initially she had a good response getting elevated 246 pounds from 256, but that has subsequently bounced back up into the low 250s.  She said that they had put her on on full 24-hour oxygen, but she stopped using it as of last night because it was uncomfortable. She says that she cannot sleep lying flat, usually sleeps lying on her side.  But if she ends up on her back, she  would be extremely short of breath and has to sit up.    She is quite debilitated at home, and does not necessarily do much walking at baseline.  She has a left leg in a brace from a potential injury.  Simply walking from our front desk back to the clinic room would have caused her to be considerably dyspneic to the point where she came in on a wheelchair.  She denies having any chest tightness or pressure with rest or exertion.  But she does have some pinpoint tenderness along the left upper breast on that is easily reproducible on exam.  Cardiovascular review of symptoms (summary): positive for - dyspnea on exertion, edema, orthopnea, paroxysmal nocturnal dyspnea, shortness of breath  and Exercise intolerance.  Dizziness with certain activities especially bending down to get dressed. negative for - chest pain, irregular heartbeat, palpitations, rapid heart rate or Syncope/near syncope or TIA/amaurosis fugax.  Does not really walk far enough to note claudication  The patient does not have symptoms concerning for COVID-19 infection (fever, chills, cough, or new shortness of breath).  The patient is practicing social distancing. ++  Masking.  She does not do shopping.  Her son or other family members help.   REVIEWED OF SYSTEMS   ROS: A comprehensive was performed. Review of Systems  Constitutional: Positive for malaise/fatigue. Negative for chills, fever and weight loss (Unable to lose back the weight that she has gained as likely fluid weight.).  HENT: Negative for congestion and nosebleeds.   Respiratory: Positive for cough (This actually improved with treatment of what was thought to be aspiration pneumonia) and shortness of breath. Negative for wheezing.   Gastrointestinal: Positive for heartburn. Negative for abdominal pain, blood in stool, constipation and melena.  Genitourinary: Negative for hematuria.  Musculoskeletal: Positive for joint pain (Left ankle). Negative for falls (Not recently, but does have but poor balance) and neck pain.  Neurological: Positive for dizziness (With certain activities including bending over) and weakness (Left leg). Negative for focal weakness, seizures and headaches.  Psychiatric/Behavioral: Negative for memory loss. The patient is nervous/anxious and has insomnia (Does not sleep well).   All other systems reviewed and are negative.  I have reviewed and (if needed) personally updated the patient's problem list, medications, allergies, past medical and surgical history, social and family history.   PAST MEDICAL HISTORY   Past Medical History:  Diagnosis Date  . Acute myocardial infarction of anterior wall (HCC) 10/20/2013   Afib, LBBB   . Acute pulmonary edema with congestive heart failure ([place]) 10/20/2013   Resolved  . Arthritis   . Atrial fibrillation ([place]) 10/20/2013   On Warfarin  . CAD, multiple vessel 10/20/2013   LM, LAD & RCA --> CABG; MYOVIEW 12/'15: Intermediate Risk would large anterior, anteroapical segment the apex possible infarct and peri-infarct ischemia  . CHF (congestive heart failure) (HCC)    EF 40-45%.  . Chronic kidney disease   . COPD (chronic obstructive pulmonary disease) ([place])   . Diabetic neuropathy ([place])   . Heart murmur    Likely related to aortic sclerosis  . Hyperlipidemia   . Hypertension   . Hypothyroidism   . Ischemic cardiomyopathy - Notable Improvement in EF post CABG 10/20/2013   Echo 2/23: EF 20-25%; mild LVH. anteroseptal Akinesis; mid-apical anterior, inferior & inferoseptal + apical-lateral akinesis; G 1 DDysfxn.; Mild-Mod LA dil; mild MR; Mod PHTN;; F/u Echo 12/2013: EF 40-45%, septal & apical HK, mild LVH; Mod LA dilation  . Left bundle  branch block (LBBB) on electrocardiogram   . Mild mitral regurgitation by prior echocardiogram    Mild-Mod MR on Echo  . Morbid obesity ([place])   . OSTEOARTHRITIS 08/06/2006  . S/P CABG x 3 10/23/2013   LIMA to LAD, SVG to OM2, SVG to RPLB, EVH via right thigh  . Type II diabetes mellitus with complication ([place]) 37/85/8850   off rx since heart attack 12/15-dr told could stay off if keep cbg under 150  . UTI (lower urinary tract infection) 09/29/14   ongoing now. started rx 09/28/14     PAST SURGICAL HISTORY   Past Surgical History:  Procedure Laterality Date  . ABDOMINAL HYSTERECTOMY    . BREAST LUMPECTOMY W/ NEEDLE LOCALIZATION Right 10/06/2014   Dr Georgette Dover  . BREAST LUMPECTOMY WITH NEEDLE LOCALIZATION Right 10/06/2014   Procedure: RIGHT BREAST LUMPECTOMY WITH NEEDLE LOCALIZATION;  Surgeon: Donnie Mesa, MD;  Location: [facility];  Service: General;  Laterality: Right;  . CLIPPING OF ATRIAL APPENDAGE N/A 10/23/2013   Procedure: CLIPPING OF ATRIAL  APPENDAGE;  Surgeon: Rexene Alberts, MD;  Location: [facility];  Service: Open Heart Surgery;  Laterality: N/A;  . 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  . CORONARY ARTERY BYPASS GRAFT N/A 10/23/2013   Procedure: CORONARY ARTERY BYPASS GRAFTING (CABG);  Surgeon: Rexene Alberts, MD;  Location: [facility];  Service: Open Heart Surgery: LIMA-LAD, SVG-OM2, SVG-RPL  . INTRAOPERATIVE TRANSESOPHAGEAL ECHOCARDIOGRAM N/A 10/23/2013   Procedure: INTRAOPERATIVE TRANSESOPHAGEAL ECHOCARDIOGRAM;  Surgeon: Rexene Alberts, MD;  Location: [facility];  Service: Open Heart Surgery;  Laterality: N/A;  . LEFT HEART CATHETERIZATION WITH CORONARY ANGIOGRAM N/A 10/20/2013   Procedure: LEFT HEART CATHETERIZATION WITH CORONARY ANGIOGRAM;  Surgeon: Leonie Man, MD;  Location[facility] CATH LAB;  Service: Cardiovascular: Distal LM ~70%, LAD - ostial 70%, prox 80, 95 & 95%; RCA prox 70%, mid 80%; minimal Cx disease  . LEFT HEART CATHETERIZATION WITH CORONARY/GRAFT ANGIOGRAM N/A 09/08/2014   Procedure: LEFT HEART CATHETERIZATION WITH Beatrix Fetters;  Surgeon: Leonie Man, MD;  Location: [facility] CATH LAB: Indication: Abnormal Myoview. Occluded LAD after 70 and 80% left main. Diffuse RCA disease. Widely patent grafts to moderately diseased artery vessels. Mild-moderate LV dysfunction and chronic A. fib.  Elaine [name] NM MYOVIEW LTD  08/14/2014   Lexi scan: INTERMEDIATE RISK - large, severe intensity perfusion defect in anterior wall, apex and inferior apex that is partly reversible. This suggests either scar or possible hibernating myocardium. Nondeviated.-> Likely scar. No change in coronary anatomy with patent grafts.  Elaine [name] TOTAL HIP ARTHROPLASTY  2010, 2011   right 2010, left 2011  . TRANSTHORACIC ECHOCARDIOGRAM  12/2013   Mildly dilated left ventricle with mild to moderately reduced function. EF 40-45% (notable improvement from February 2015). Septal and apical hypokinesis. Mild LA dilation.    MEDICATIONS/ALLERGIES   Current Meds  Medication  Sig  . apixaban (ELIQUIS) 5 MG TABS tablet Take 5 mg by mouth 2 (two) times daily.  Elaine [name] aspirin EC 81 MG tablet Take 1 tablet (81 mg total) by mouth daily.  . carvedilol (COREG) 6.25 MG tablet TAKE ONE TABLET BY MOUTH TWICE DAILY WITH A MEAL  . co-enzyme Q-10 30 MG capsule Take 30 mg by mouth daily.  . Dulaglutide (TRULICITY) 2.77 AJ/2.8NO SOPN Inject 0.75 mg into the skin every 7 (seven) days. Saturday of each week  . ferrous sulfate 325 (65 FE) MG tablet TAKE ONE TABLET BY MOUTH ONCE DAILY WITH  BREAKFAST  . furosemide (LASIX) 80 MG  tablet Take 80 mg by mouth 2 (two) times daily.   . hydrochlorothiazide (HYDRODIURIL) 12.5 MG tablet Take 12.5 mg by mouth daily.  Elaine [name] ipratropium-albuterol (DUONEB) 0.5-2.5 (3) MG/3ML SOLN Take 3 mLs by nebulization every 4 (four) hours as needed (for shortness of breath).   Elaine [name] levothyroxine (SYNTHROID) 112 MCG tablet Take 1 tablet (112 mcg total) by mouth daily before breakfast.  . lisinopril (PRINIVIL,ZESTRIL) 2.5 MG tablet Take 2.5 mg by mouth daily.  . metolazone (ZAROXOLYN) 2.5 MG tablet Take 2.5 mg by mouth daily. TAKE AS NEEDED  . multivitamin-lutein (OCUVITE-LUTEIN) CAPS capsule Take 1 capsule by mouth daily.  . prednisoLONE acetate (PRED FORTE) 1 % ophthalmic suspension Place 1 drop into the right eye daily.  Elaine [name] PROAIR HFA 108 (90 BASE) MCG/ACT inhaler Inhale 1 puff into the lungs daily.  . rosuvastatin (CRESTOR) 20 MG tablet Take 20 mg by mouth every other day.   Elaine [name] ULORIC 40 MG tablet Take 40 mg by mouth daily.  Elaine [name] umeclidinium-vilanterol (ANORO ELLIPTA) 62.5-25 MCG/INH AEPB Inhale 1 puff into the lungs daily.  . [DISCONTINUED] metoprolol succinate (TOPROL-XL) 25 MG 24 hr tablet Take 25 mg by mouth daily.    Allergies  Allergen Reactions  . Allopurinol Hives  . Tape Rash and Other (See Comments)    Uncoded Allergy. Allergen: Tape Adhesive tape     SOCIAL HISTORY/FAMILY HISTORY   Social History   Tobacco Use  . Smoking status: Former Smoker     Packs/day: 0.50    Years: 39.00    Pack years: 19.50    Types: Cigarettes    Quit date: 08/28/1993    Years since quitting: 25.8  . Smokeless tobacco: Never Used  Substance Use Topics  . Alcohol use: No    Comment: quit 95  . Drug use: No   Social History   Social History Narrative   Lives with husband and son in [place].  She is currently retired.   Former smoker quit in 1995.    family history is not on file.   OBJCTIVE -PE, EKG, labs   Wt Readings from Last 3 Encounters:  06/11/19 253 lb 11.2 oz (115.1 kg)  04/22/19 246 lb 12.8 oz (111.9 kg)  05/06/18 247 lb 11.2 oz (112.4 kg)    Physical Exam: BP 140/80 (BP Location: Right Arm, Patient Position: Sitting, Cuff Size: Normal)   Pulse 87   Temp (!) 97.2 F (36.2 C)   Ht 5\' 2"  (1.575 m)   Wt 253 lb 11.2 oz (115.1 kg)   BMI 46.40 kg/m  Physical Exam  Constitutional: She is oriented to person, place, and time. No distress (No obvious distress, just seems to be somewhat fatigued and more short of breath).  Morbidly obese elderly woman sitting in wheelchair.  HENT:  Head: Normocephalic and atraumatic.  Eyes:  Right I frosted over from prior corneal transplant.  Is blind from that eye.  Neck: Normal range of motion. Neck supple. Hepatojugular reflux and JVD (To the angle of the jaw) present. Carotid bruit is not present.  Cardiovascular: Normal rate, S1 normal and S2 normal. An irregularly irregular rhythm present. PMI is not displaced (Unable to palpate). Exam reveals distant heart sounds, friction rub and decreased pulses (Unable to palpate pedal pulses because of swelling). Exam reveals no gallop.  Murmur (Difficult to assess cannot rule out soft systolic murmur.) heard. Pulmonary/Chest: Effort normal and breath sounds normal. No respiratory distress. She has no wheezes. She exhibits tenderness (Focal point tenderness along the  midclavicular line on the left side).  Abdominal: Soft. Bowel sounds are normal. She  exhibits no distension. There is no abdominal tenderness. There is no rebound.  She does note increased abdominal girth, but unable to assess HSM due to body habitus  Musculoskeletal:        General: Edema (Anasarca to above the knees) present.     Comments: Left ankle up to the knee in a brace. Currently sitting in a wheelchair.  Has difficult unsteady gait  Neurological: She is alert and oriented to person, place, and time. No cranial nerve deficit (Just the abductor and abductor muscles on the right I nonfunctional).  Skin: Skin is warm and dry. No erythema.  Vitals reviewed.    Adult ECG Report A. fib-83 bpm.  LBBB with left axis deviation (-59 degrees).  Recent Labs:  From PCP July 2020- K+ 4.0, Cr 1.9; TC 125, TG 47, HDL 55, LDL 61 Lab Results  Component Value Date   CREATININE 1.65 (H) 10/08/2017   BUN 32 (H) 10/08/2017   NA 142 10/08/2017   K 4.0 10/08/2017   CL 97 (L) 10/08/2017   CO2 31 10/08/2017    ASSESSMENT/PLAN   Problem List Items Addressed This Visit    History of acute anterior wall myocardial infarction (Chronic)    Initial presentation seem to be consistent with anterior MI.  But she was found to have multivessel disease.  There does appear to be a large fixed defect in the Myoview mostly in the inferior wall however.  Follow-up cardiac catheterization showed patent grafts with diffuse native disease.  She had initial improvement of EF after revascularization and medical management.      ICM EF by Echo 20-25% Feb 2015, recently improved to 40-45% by echo 01/13/14 (Chronic)    Follow-up echo in 2015 showed improved EF.  Unfortunately now she has worsening CHF symptoms.  She had gotten her weight all the way down to 206 now is up almost 20 pounds.  Needs a new echocardiogram evaluation.  Concerning with A. fib and the bundle branch block.  Once she becomes more euvolemic, could consider increasing carvedilol dose as well as titrating up ACE inhibitor (versus  converting to ARB-Entresto)      Relevant Medications   hydrochlorothiazide (HYDRODIURIL) 12.5 MG tablet   metolazone (ZAROXOLYN) 2.5 MG tablet   Morbid obesity- BMI 48 (Chronic)    This is certainly a limiting feature.  She had done so well to get her weight down and now is gained it back rapidly would argue that this is probably heart failure related.      Chronic kidney disease, stage III (moderate) (Chronic)    Listed nephrologist have been Dr. Florene Glen.  This would like they have changed.  Her last creatinine was 1.9.  Need to be careful with diuresis, however I do a suspect that she may get slightly worse before she improves.  Because of her significant weight gain and the need for titration of diuretics the setting of renal insufficiency, I felt it was safer to admit her to the hospital for management.      CAD status post CABG (Chronic)    She had significant CAD noted on cath back in 2015, referred for CABG.  Not having chest pain or pressure per se, but is having worsening heart failure/dyspnea symptoms.  We will check 2D echo, if EF is down, may need ischemic evaluation--would need to be careful with renal insufficiency.  Is on ACE inhibitor, beta-blocker,  statin and aspirin along with aspirin. Does not seem to have active angina symptoms      Relevant Medications   hydrochlorothiazide (HYDRODIURIL) 12.5 MG tablet   metolazone (ZAROXOLYN) 2.5 MG tablet   Other Relevant Orders   EKG 12-Lead   Hyperlipidemia with target LDL less than 70 (Chronic)    Remains on modest dose rosuvastatin.  Labs as of May were pretty well controlled.  Continue for now      Relevant Medications   hydrochlorothiazide (HYDRODIURIL) 12.5 MG tablet   metolazone (ZAROXOLYN) 2.5 MG tablet   Essential hypertension (Chronic)    Blood pressure is up little bit today.  Could be because of increased volume with weight gain, plan to lisinopril up to 5 mg once we see her renal function.  If EF is down,  would probably convert to ARB.  This would allow [place] to potentially consider Entresto.  We will start on HCTZ for additional diuretic.  I will stop this as we will be giving higher dose of Lasix and would preferentially use spironolactone (albeit somewhat of an issue with her renal insufficiency). Is on stable dose of carvedilol which could be increased as well, but not in the setting of volume overload.      Relevant Medications   hydrochlorothiazide (HYDRODIURIL) 12.5 MG tablet   metolazone (ZAROXOLYN) 2.5 MG tablet   Permanent atrial fibrillation (HCC) - CHA2DS2-VASc Score 6, on Eliquis (Chronic)    Most likely permanent A. fib.  Unfortunately with left bundle branch block I am concerned that she may now have a drop in EF back to her prior nadir.  With A. fib, there is no atrial kick exacerbated by the bundle branch block.  Concerning for worsening CHF.  Continue Eliquis.  Is adequately rate controlled on beta-blocker.  I doubt seriously be to get her out of A. fib at this point.      Relevant Medications   hydrochlorothiazide (HYDRODIURIL) 12.5 MG tablet   metolazone (ZAROXOLYN) 2.5 MG tablet   Other Relevant Orders   EKG 12-Lead   Left bundle branch block (LBBB) on electrocardiogram (Chronic)    Appears to be a chronic left bundle branch block however somewhat compromising the setting of A. fib.  No symptoms to suggest bradycardia.      Severe obesity (BMI >= 40) (HCC) (Chronic)    Very difficult situation, hard to tell her for current BMI is related to volume overload or not.  She had gotten already on 236 pounds.  I think we need to shoot for at least getting her diuresed down to below 240 pounds.  We could probably do the rest then as an outpatient.  Suspect with what looks like anasarca that she would not be absorbing p.o. meds.       DOE (dyspnea on exertion) (Chronic)   Relevant Orders   EKG 12-Lead   Type II diabetes mellitus with complication (HCC) (Chronic)    Hemoglobin  A1c in July was 5.4.  Well-controlled.  On stable medications. Is on Trulicity, would probably hold in the hospital and treat with sliding scale insulin.  Looks like relatively well controlled. Consider SGLT2 inhibitor.      S/P CABG x 3- 10/23/13 (Chronic)    Notify her heart surgeon if any gross abnormalities found.      Edema of both legs (Chronic)    My suspicion is that this is actually part of anasarca as there is some abdominal girth as well. We can evaluate with another  echocardiogram for new Shanon Brow.  Admit for IV diuretics      Acute on chronic combined systolic and diastolic CHF (congestive heart failure) ([place]) - Primary    She has symptoms of orthopnea, PND and JVD along with almost anasarca related edema.  When I saw her 18 months ago, she was at this current weight, but she tells me that she her weight has been down with diet and exercise to roughly 236 pounds in June-July timeframe.  Today's weight is consistent with her home weights.  She had been started on metolazone and HCTZ in addition to her Lasix.  Initially had a good brisk response dropping 10 pounds but then has subsequently gained back 8 pounds.   Plan: -with this much swelling would require IV diuresis.We will admit to hospital -- Check EKG, Echo, 2V CXR & Baseline Lab (CBC, CMP, BNP) -- Initiate IV diuresis - IV 80 mg BID Lasix with 2.5 mg PO Metolazone 30 min prior -- d/c HCTZ - pending renal Fxn - consider adding Spironolactone -- Continue Carvedilol @ current dose & if likely increase ACE-I to 5 mg (consder changing to ARB if EF < 40%).      Relevant Medications   hydrochlorothiazide (HYDRODIURIL) 12.5 MG tablet   metolazone (ZAROXOLYN) 2.5 MG tablet   Other Relevant Orders   EKG 12-Lead      [facility] to [facility] on Chronic Combined CHF - (up ~20 lb from June-July) with ++ JVD/ HJR & anasarca -- unable to drop weight using PO diuretics.  -- Check EKG, Echo, 2V CXR & Baseline Lab (CBC, CMP,  BNP) -- Initiate IV diuresis - IV 80 mg BID Lasix with 2.5 mg PO Metolazone 30 min prior --> closely follow renal function -- d/c HCTZ - pending renal Fxn - consider adding Spironolactone -- Continue Carvedilol @ current dose & if likely increase ACE-I to 5 mg (consder changing to ARB if EF < 40%).  -- on Uloric w/ h/o Gout  Permanent Afib - on BB & Eliquis -- with LBBB concern for worsengin EF -> Check Echo.   DM - SSI (likley hold Trulicity).  Consider SGLT-2 I on prior to d/c  CAD - if EF down, may need ischemic evaluation.  -- continue Carvedilol & lisinopril along with rosuvastatin.  -- On ASA + Eliquis.  Continue COPD meds - ProAir, Anoro  Continue thyroid replacement & FeSO4   I spent a total of 54minutes with the patient and chart review. >  50% of the time was spent in direct patient consultation.  Additional time spent with chart review (studies, outside notes, etc): 20 Total Time: 50 min   Current medicines are reviewed at length with the patient today.  (+/- concerns) had been taking lots of pills to help with swelling but were not as effective.   Patient Instructions / Medication Changes & Studies & Tests Ordered   Patient Instructions  Medication Instructions:  NO CHANGES If you need a refill on your cardiac medications before your next appointment, please call your pharmacy.   Lab work: NOT NEEDED   Testing/Procedures:  NOT NEEDED   Follow-Up:  . PLEASE AWAIT [facility] TO CONTACT YOU TOMORROW OCT 15 ,2020 FOR DIRECT ADMISSION IF YOU DO NOT RECEIVE A CALL BY 3 PM CALL THE OFFICE.   Any Other Special Instructions Will Be Listed Below (If Applicable).    Studies Ordered:   Orders Placed This Encounter  Procedures  . EKG 12-Lead  Glenetta Hew, M.D., M.S. Interventional Cardiologist   Pager # [phone] Phone # [phone] [address] [place], [place] [address]   Thank you for choosing [facility] at [facility]!!

## 2019-06-11 NOTE — Assessment & Plan Note (Signed)
Blood pressure is up little bit today.  Could be because of increased volume with weight gain, plan to lisinopril up to 5 mg once we see her renal function.  If EF is down, would probably convert to ARB.  This would allow [place] to potentially consider Entresto.  We will start on HCTZ for additional diuretic.  I will stop this as we will be giving higher dose of Lasix and would preferentially use spironolactone (albeit somewhat of an issue with her renal insufficiency). Is on stable dose of carvedilol which could be increased as well, but not in the setting of volume overload.

## 2019-06-11 NOTE — Assessment & Plan Note (Signed)
Notify her heart surgeon if any gross abnormalities found.

## 2019-06-11 NOTE — Assessment & Plan Note (Signed)
Very difficult situation, hard to tell her for current BMI is related to volume overload or not.  She had gotten already on 236 pounds.  I think we need to shoot for at least getting her diuresed down to below 240 pounds.  We could probably do the rest then as an outpatient.  Suspect with what looks like anasarca that she would not be absorbing p.o. meds.

## 2019-06-12 ENCOUNTER — Inpatient Hospital Stay (HOSPITAL_COMMUNITY)
Admission: RE | Admit: 2019-06-12 | Discharge: 2019-06-16 | DRG: 291 | Disposition: A | Discharge status: Home Health | Payer: Medicare Other | Source: Ambulatory Visit | Attending: Cardiology | Admitting: Cardiology

## 2019-06-12 DIAGNOSIS — Z79890 Hormone replacement therapy (postmenopausal): Secondary | ICD-10-CM

## 2019-06-12 DIAGNOSIS — M109 Gout, unspecified: Secondary | ICD-10-CM | POA: Diagnosis present

## 2019-06-12 DIAGNOSIS — I872 Venous insufficiency (chronic) (peripheral): Secondary | ICD-10-CM | POA: Diagnosis present

## 2019-06-12 DIAGNOSIS — E1122 Type 2 diabetes mellitus with diabetic chronic kidney disease: Secondary | ICD-10-CM | POA: Diagnosis present

## 2019-06-12 DIAGNOSIS — Z7982 Long term (current) use of aspirin: Secondary | ICD-10-CM | POA: Diagnosis not present

## 2019-06-12 DIAGNOSIS — R0602 Shortness of breath: Secondary | ICD-10-CM

## 2019-06-12 DIAGNOSIS — N184 Chronic kidney disease, stage 4 (severe): Secondary | ICD-10-CM | POA: Diagnosis not present

## 2019-06-12 DIAGNOSIS — I25810 Atherosclerosis of coronary artery bypass graft(s) without angina pectoris: Secondary | ICD-10-CM | POA: Diagnosis not present

## 2019-06-12 DIAGNOSIS — E039 Hypothyroidism, unspecified: Secondary | ICD-10-CM | POA: Diagnosis present

## 2019-06-12 DIAGNOSIS — E785 Hyperlipidemia, unspecified: Secondary | ICD-10-CM | POA: Diagnosis present

## 2019-06-12 DIAGNOSIS — I2510 Atherosclerotic heart disease of native coronary artery without angina pectoris: Secondary | ICD-10-CM | POA: Diagnosis present

## 2019-06-12 DIAGNOSIS — I5043 Acute on chronic combined systolic (congestive) and diastolic (congestive) heart failure: Secondary | ICD-10-CM | POA: Diagnosis present

## 2019-06-12 DIAGNOSIS — I130 Hypertensive heart and chronic kidney disease with heart failure and stage 1 through stage 4 chronic kidney disease, or unspecified chronic kidney disease: Secondary | ICD-10-CM | POA: Diagnosis present

## 2019-06-12 DIAGNOSIS — Z7952 Long term (current) use of systemic steroids: Secondary | ICD-10-CM

## 2019-06-12 DIAGNOSIS — Z79899 Other long term (current) drug therapy: Secondary | ICD-10-CM | POA: Diagnosis not present

## 2019-06-12 DIAGNOSIS — Z87891 Personal history of nicotine dependence: Secondary | ICD-10-CM

## 2019-06-12 DIAGNOSIS — E1140 Type 2 diabetes mellitus with diabetic neuropathy, unspecified: Secondary | ICD-10-CM | POA: Diagnosis present

## 2019-06-12 DIAGNOSIS — Z96643 Presence of artificial hip joint, bilateral: Secondary | ICD-10-CM | POA: Diagnosis present

## 2019-06-12 DIAGNOSIS — I361 Nonrheumatic tricuspid (valve) insufficiency: Secondary | ICD-10-CM | POA: Diagnosis not present

## 2019-06-12 DIAGNOSIS — Z90710 Acquired absence of both cervix and uterus: Secondary | ICD-10-CM | POA: Diagnosis not present

## 2019-06-12 DIAGNOSIS — Z7951 Long term (current) use of inhaled steroids: Secondary | ICD-10-CM

## 2019-06-12 DIAGNOSIS — E669 Obesity, unspecified: Secondary | ICD-10-CM | POA: Diagnosis present

## 2019-06-12 DIAGNOSIS — I255 Ischemic cardiomyopathy: Secondary | ICD-10-CM | POA: Diagnosis present

## 2019-06-12 DIAGNOSIS — I252 Old myocardial infarction: Secondary | ICD-10-CM

## 2019-06-12 DIAGNOSIS — J449 Chronic obstructive pulmonary disease, unspecified: Secondary | ICD-10-CM | POA: Diagnosis present

## 2019-06-12 DIAGNOSIS — I447 Left bundle-branch block, unspecified: Secondary | ICD-10-CM | POA: Diagnosis not present

## 2019-06-12 DIAGNOSIS — N179 Acute kidney failure, unspecified: Secondary | ICD-10-CM | POA: Diagnosis present

## 2019-06-12 DIAGNOSIS — I4821 Permanent atrial fibrillation: Secondary | ICD-10-CM | POA: Diagnosis present

## 2019-06-12 DIAGNOSIS — N183 Chronic kidney disease, stage 3 unspecified: Secondary | ICD-10-CM | POA: Diagnosis present

## 2019-06-12 DIAGNOSIS — Z20828 Contact with and (suspected) exposure to other viral communicable diseases: Secondary | ICD-10-CM | POA: Diagnosis present

## 2019-06-12 DIAGNOSIS — R0600 Dyspnea, unspecified: Secondary | ICD-10-CM

## 2019-06-12 DIAGNOSIS — Z7901 Long term (current) use of anticoagulants: Secondary | ICD-10-CM

## 2019-06-12 DIAGNOSIS — Z951 Presence of aortocoronary bypass graft: Secondary | ICD-10-CM

## 2019-06-12 DIAGNOSIS — R600 Localized edema: Secondary | ICD-10-CM

## 2019-06-12 DIAGNOSIS — E108 Type 1 diabetes mellitus with unspecified complications: Secondary | ICD-10-CM | POA: Diagnosis not present

## 2019-06-12 DIAGNOSIS — Z6841 Body Mass Index (BMI) 40.0 and over, adult: Secondary | ICD-10-CM

## 2019-06-12 DIAGNOSIS — I10 Essential (primary) hypertension: Secondary | ICD-10-CM | POA: Diagnosis present

## 2019-06-12 DIAGNOSIS — E1169 Type 2 diabetes mellitus with other specified complication: Secondary | ICD-10-CM | POA: Diagnosis present

## 2019-06-12 DIAGNOSIS — E118 Type 2 diabetes mellitus with unspecified complications: Secondary | ICD-10-CM | POA: Diagnosis present

## 2019-06-12 DIAGNOSIS — N1832 Chronic kidney disease, stage 3b: Secondary | ICD-10-CM | POA: Diagnosis not present

## 2019-06-12 LAB — COMPREHENSIVE METABOLIC PANEL
ALT: 20 U/L (ref 0–44)
AST: 63 U/L — ABNORMAL HIGH (ref 15–41)
Albumin: 3.7 g/dL (ref 3.5–5.0)
Alkaline Phosphatase: 89 U/L (ref 38–126)
Anion gap: 11 (ref 5–15)
BUN: 33 mg/dL — ABNORMAL HIGH (ref 8–23)
CO2: 28 mmol/L (ref 22–32)
Calcium: 10.1 mg/dL (ref 8.9–10.3)
Chloride: 102 mmol/L (ref 98–111)
Creatinine, Ser: 1.72 mg/dL — ABNORMAL HIGH (ref 0.44–1.00)
GFR calc Af Amer: 32 mL/min — ABNORMAL LOW (ref >60)
GFR calc non Af Amer: 28 mL/min — ABNORMAL LOW (ref >60)
Glucose, Bld: 143 mg/dL — ABNORMAL HIGH (ref 70–99)
Potassium: 4.4 mmol/L (ref 3.5–5.1)
Sodium: 141 mmol/L (ref 135–145)
Total Bilirubin: 1.1 mg/dL (ref 0.3–1.2)
Total Protein: 7.3 g/dL (ref 6.5–8.1)

## 2019-06-12 LAB — CBC WITH DIFFERENTIAL/PLATELET
Abs Immature Granulocytes: 0.01 10*3/uL (ref 0.00–0.07)
Basophils Absolute: 0 10*3/uL (ref 0.0–0.1)
Basophils Relative: 0 %
Eosinophils Absolute: 0.2 10*3/uL (ref 0.0–0.5)
Eosinophils Relative: 3 %
HCT: 32.1 % — ABNORMAL LOW (ref 36.0–46.0)
Hemoglobin: 10.5 g/dL — ABNORMAL LOW (ref 12.0–15.0)
Immature Granulocytes: 0 %
Lymphocytes Relative: 29 %
Lymphs Abs: 1.3 10*3/uL (ref 0.7–4.0)
MCH: 29.2 pg (ref 26.0–34.0)
MCHC: 32.7 g/dL (ref 30.0–36.0)
MCV: 89.4 fL (ref 80.0–100.0)
Monocytes Absolute: 0.4 10*3/uL (ref 0.1–1.0)
Monocytes Relative: 10 %
Neutro Abs: 2.6 10*3/uL (ref 1.7–7.7)
Neutrophils Relative %: 58 %
Platelets: 180 10*3/uL (ref 150–400)
RBC: 3.59 MIL/uL — ABNORMAL LOW (ref 3.87–5.11)
RDW: 16.2 % — ABNORMAL HIGH (ref 11.5–15.5)
WBC: 4.5 10*3/uL (ref 4.0–10.5)
nRBC: 0 % (ref 0.0–0.2)

## 2019-06-12 LAB — GLUCOSE, CAPILLARY
Glucose-Capillary: 125 mg/dL — ABNORMAL HIGH (ref 70–99)
Glucose-Capillary: 121 mg/dL — ABNORMAL HIGH (ref 70–99)
Glucose-Capillary: 141 mg/dL — ABNORMAL HIGH (ref 70–99)

## 2019-06-12 LAB — BRAIN NATRIURETIC PEPTIDE: B Natriuretic Peptide: 919.9 pg/mL — ABNORMAL HIGH (ref 0.0–100.0)

## 2019-06-12 MED ORDER — METOLAZONE 2.5 MG PO TABS
2.5000 mg | ORAL_TABLET | Freq: Every day | ORAL | Status: DC
  Administered 2019-06-13: 09:00:00 2.5 mg via ORAL
  Filled 2019-06-12: qty 1

## 2019-06-12 MED ORDER — CARVEDILOL 6.25 MG PO TABS
6.2500 mg | ORAL_TABLET | Freq: Two times a day (BID) | ORAL | Status: DC
  Administered 2019-06-12 – 2019-06-13 (×2): 6.25 mg via ORAL
  Filled 2019-06-12 (×3): qty 1

## 2019-06-12 MED ORDER — FERROUS SULFATE 325 (65 FE) MG PO TABS
325.0000 mg | ORAL_TABLET | Freq: Every day | ORAL | Status: DC
  Administered 2019-06-13 – 2019-06-16 (×4): 325 mg via ORAL
  Filled 2019-06-12 (×3): qty 1

## 2019-06-12 MED ORDER — IPRATROPIUM-ALBUTEROL 0.5-2.5 (3) MG/3ML IN SOLN: 3.0000 mL | RESPIRATORY_TRACT | Status: DC | PRN

## 2019-06-12 MED ORDER — ASPIRIN EC 81 MG PO TBEC
81.0000 mg | DELAYED_RELEASE_TABLET | Freq: Every day | ORAL | Status: DC
  Administered 2019-06-13 – 2019-06-16 (×4): 81 mg via ORAL
  Filled 2019-06-12 (×4): qty 1

## 2019-06-12 MED ORDER — ONDANSETRON HCL 4 MG/2ML IJ SOLN: 4.0000 mg | Freq: Four times a day (QID) | INTRAMUSCULAR | Status: DC | PRN

## 2019-06-12 MED ORDER — ACETAMINOPHEN 325 MG PO TABS: 650.0000 mg | ORAL_TABLET | ORAL | Status: DC | PRN

## 2019-06-12 MED ORDER — ALBUTEROL SULFATE HFA 108 (90 BASE) MCG/ACT IN AERS: 1.0000 | INHALATION_SPRAY | Freq: Every day | RESPIRATORY_TRACT | Status: DC

## 2019-06-12 MED ORDER — SODIUM CHLORIDE 0.9% FLUSH
3.0000 mL | Freq: Two times a day (BID) | INTRAVENOUS | Status: DC
  Administered 2019-06-13 – 2019-06-16 (×7): 3 mL via INTRAVENOUS

## 2019-06-12 MED ORDER — INSULIN ASPART 100 UNIT/ML ~~LOC~~ SOLN
0.0000 [IU] | Freq: Three times a day (TID) | SUBCUTANEOUS | Status: DC
  Administered 2019-06-13 – 2019-06-14 (×3): 2 [IU] via SUBCUTANEOUS
  Administered 2019-06-15: 3 [IU] via SUBCUTANEOUS
  Administered 2019-06-16 (×2): 2 [IU] via SUBCUTANEOUS

## 2019-06-12 MED ORDER — UMECLIDINIUM-VILANTEROL 62.5-25 MCG/INH IN AEPB
1.0000 | INHALATION_SPRAY | Freq: Every day | RESPIRATORY_TRACT | Status: DC
  Filled 2019-06-12: qty 14

## 2019-06-12 MED ORDER — FEBUXOSTAT 40 MG PO TABS
40.0000 mg | ORAL_TABLET | Freq: Every day | ORAL | Status: DC
  Administered 2019-06-13 – 2019-06-16 (×4): 40 mg via ORAL
  Filled 2019-06-12 (×4): qty 1

## 2019-06-12 MED ORDER — LOSARTAN POTASSIUM 25 MG PO TABS
25.0000 mg | ORAL_TABLET | Freq: Every day | ORAL | Status: DC
  Administered 2019-06-13 – 2019-06-16 (×4): 25 mg via ORAL
  Filled 2019-06-12 (×4): qty 1

## 2019-06-12 MED ORDER — INSULIN ASPART 100 UNIT/ML ~~LOC~~ SOLN: 0.0000 [IU] | Freq: Every day | SUBCUTANEOUS | Status: DC

## 2019-06-12 MED ORDER — SODIUM CHLORIDE 0.9 % IV SOLN: 250.0000 mL | INTRAVENOUS | Status: DC | PRN

## 2019-06-12 MED ORDER — PREDNISOLONE ACETATE 1 % OP SUSP
1.0000 [drp] | Freq: Every day | OPHTHALMIC | Status: DC
  Administered 2019-06-12 – 2019-06-16 (×5): 1 [drp] via OPHTHALMIC
  Filled 2019-06-12: qty 5

## 2019-06-12 MED ORDER — FUROSEMIDE 10 MG/ML IJ SOLN
80.0000 mg | Freq: Two times a day (BID) | INTRAMUSCULAR | Status: DC
  Administered 2019-06-12 – 2019-06-15 (×7): 80 mg via INTRAVENOUS
  Filled 2019-06-12 (×7): qty 8

## 2019-06-12 MED ORDER — ROSUVASTATIN CALCIUM 20 MG PO TABS
20.0000 mg | ORAL_TABLET | ORAL | Status: DC
  Administered 2019-06-13 – 2019-06-15 (×2): 20 mg via ORAL
  Filled 2019-06-12 (×3): qty 1

## 2019-06-12 MED ORDER — APIXABAN 5 MG PO TABS
5.0000 mg | ORAL_TABLET | Freq: Two times a day (BID) | ORAL | Status: DC
  Administered 2019-06-12 – 2019-06-16 (×8): 5 mg via ORAL
  Filled 2019-06-12 (×8): qty 1

## 2019-06-12 MED ORDER — UMECLIDINIUM-VILANTEROL 62.5-25 MCG/INH IN AEPB
1.0000 | INHALATION_SPRAY | Freq: Every day | RESPIRATORY_TRACT | Status: DC
  Administered 2019-06-14 – 2019-06-16 (×4): 1 via RESPIRATORY_TRACT
  Filled 2019-06-12: qty 14

## 2019-06-12 MED ORDER — LEVOTHYROXINE SODIUM 112 MCG PO TABS
112.0000 ug | ORAL_TABLET | Freq: Every day | ORAL | Status: DC
  Administered 2019-06-13 – 2019-06-16 (×4): 112 ug via ORAL
  Filled 2019-06-12 (×4): qty 1

## 2019-06-12 MED ORDER — COENZYME Q10 30 MG PO CAPS: 30.0000 mg | ORAL_CAPSULE | Freq: Every day | ORAL | Status: DC

## 2019-06-12 MED ORDER — DULAGLUTIDE 0.75 MG/0.5ML ~~LOC~~ SOAJ: 0.7500 mg | SUBCUTANEOUS | Status: DC

## 2019-06-12 MED ORDER — SODIUM CHLORIDE 0.9% FLUSH: 3.0000 mL | INTRAVENOUS | Status: DC | PRN

## 2019-06-12 NOTE — H&P (Signed)
Cardiology Admission History and Physical:   Patient ID: MERLIN EGE MRN: [id number]; DOB: 1941/08/24   Admission date: 06/12/2019  Primary Care Provider: Marton Redwood, MD Primary Cardiologist: Glenetta Hew, MD  Primary Electrophysiologist:  None   Chief Complaint:  SOB, weight gain, orthopnea, and LE edema  Patient Profile:   Elaine [name] is a 78 y.o. female with a PMH of CAD s/p CABG with LAA clipping, ischemic cardiomyopathy (EF 40-45% in 2015, up from 20-25%), persistent atrial fibrillation, chronic LBBB, HTN, HLD, DM type 2, hypothyroidism, CKD stage 3, who presents with acute on chronic combined CHF.   History of Present Illness:   Elaine [name] was seen in the office by Dr. Ellyn Hack 06/11/2019 for routine follow-up. She was overdue for a visit. She reported she had been doing well from a cardiac standpoint until June/July. Prior to this she had been working to lose weight and was down to 235lbs with diet, exercise, and stable diuretic regimen. Since that time she has had progressively worsening LE edema, SOB, orthopnea, and weight gain of 20 lbs. She was brought to the exam room in a wheelchair at that visit as she would have been considerably dyspneic. She had no complaints of chest pain or palpitations at that time. She was found to have JVD, distant heart sounds with possible friction rub and murmur, and significant LE edema on exam. Ultimately it was felt that she would benefit from admission to the hospital for IV diuresis given significant volume overload. She presented 06/12/2019 once a bed became available.   At the time of admission she is conversationally dyspneic with significant LE edema despite compliance with her medications and addition of metolazone recently. She has had tenderness of her left upper chest which she attributes to nerve pain following CABG. No lumps in her breast. Still no complaints of chest pain, palpitations, or syncope. No complaints of bleeding.    Heart Pathway Score:     Past Medical History:  Diagnosis Date  . Acute myocardial infarction of anterior wall (HCC) 10/20/2013   Afib, LBBB  . Acute pulmonary edema with congestive heart failure ([place]) 10/20/2013   Resolved  . Arthritis   . Atrial fibrillation ([place]) 10/20/2013   On Warfarin  . CAD, multiple vessel 10/20/2013   LM, LAD & RCA --> CABG; MYOVIEW 12/'15: Intermediate Risk would large anterior, anteroapical segment the apex possible infarct and peri-infarct ischemia  . CHF (congestive heart failure) (HCC)    EF 40-45%.  . Chronic kidney disease   . COPD (chronic obstructive pulmonary disease) ([place])   . Diabetic neuropathy ([place])   . Heart murmur    Likely related to aortic sclerosis  . Hyperlipidemia   . Hypertension   . Hypothyroidism   . Ischemic cardiomyopathy - Notable Improvement in EF post CABG 10/20/2013   Echo 2/23: EF 20-25%; mild LVH. anteroseptal Akinesis; mid-apical anterior, inferior & inferoseptal + apical-lateral akinesis; G 1 DDysfxn.; Mild-Mod LA dil; mild MR; Mod PHTN;; F/u Echo 12/2013: EF 40-45%, septal & apical HK, mild LVH; Mod LA dilation  . Left bundle branch block (LBBB) on electrocardiogram   . Mild mitral regurgitation by prior echocardiogram    Mild-Mod MR on Echo  . Morbid obesity ([place])   . OSTEOARTHRITIS 08/06/2006  . S/P CABG x 3 10/23/2013   LIMA to LAD, SVG to OM2, SVG to RPLB, EVH via right thigh  . Type II diabetes mellitus with complication ([place]) 33/29/5188   off rx since heart attack 12/15-dr  told could stay off if keep cbg under 150  . UTI (lower urinary tract infection) 09/29/14   ongoing now. started rx 09/28/14    Past Surgical History:  Procedure Laterality Date  . ABDOMINAL HYSTERECTOMY    . BREAST LUMPECTOMY W/ NEEDLE LOCALIZATION Right 10/06/2014   Dr Georgette Dover  . BREAST LUMPECTOMY WITH NEEDLE LOCALIZATION Right 10/06/2014   Procedure: RIGHT BREAST LUMPECTOMY WITH NEEDLE LOCALIZATION;  Surgeon: Donnie Mesa, MD;  Location: [facility];   Service: General;  Laterality: Right;  . CLIPPING OF ATRIAL APPENDAGE N/A 10/23/2013   Procedure: CLIPPING OF ATRIAL APPENDAGE;  Surgeon: Rexene Alberts, MD;  Location: [facility];  Service: Open Heart Surgery;  Laterality: N/A;  . 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  . CORONARY ARTERY BYPASS GRAFT N/A 10/23/2013   Procedure: CORONARY ARTERY BYPASS GRAFTING (CABG);  Surgeon: Rexene Alberts, MD;  Location: [facility];  Service: Open Heart Surgery: LIMA-LAD, SVG-OM2, SVG-RPL  . INTRAOPERATIVE TRANSESOPHAGEAL ECHOCARDIOGRAM N/A 10/23/2013   Procedure: INTRAOPERATIVE TRANSESOPHAGEAL ECHOCARDIOGRAM;  Surgeon: Rexene Alberts, MD;  Location: [facility];  Service: Open Heart Surgery;  Laterality: N/A;  . LEFT HEART CATHETERIZATION WITH CORONARY ANGIOGRAM N/A 10/20/2013   Procedure: LEFT HEART CATHETERIZATION WITH CORONARY ANGIOGRAM;  Surgeon: Leonie Man, MD;  Location[facility] C[facility] LAB;  Service: Cardiovascular: Distal LM ~70%, LAD - ostial 70%, prox 80, 95 & 95%; RCA prox 70%, mid 80%; minimal Cx disease  . LEFT HEART CATHETERIZATION WITH CORONARY/GRAFT ANGIOGRAM N/A 09/08/2014   Procedure: LEFT HEART CATHETERIZATION WITH Beatrix Fetters;  Surgeon: Leonie Man, MD;  Location: [facility] CATH LAB: Indication: Abnormal Myoview. Occluded LAD after 70 and 80% left main. Diffuse RCA disease. Widely patent grafts to moderately diseased artery vessels. Mild-moderate LV dysfunction and chronic A. fib.  [name] NM MYOVIEW LTD  08/14/2014   Lexi scan: INTERMEDIATE RISK - large, severe intensity perfusion defect in anterior wall, apex and inferior apex that is partly reversible. This suggests either scar or possible hibernating myocardium. Nondeviated.-> Likely scar. No change in coronary anatomy with patent grafts.  [name] TOTAL HIP ARTHROPLASTY  2010, 2011   right 2010, left 2011  . TRANSTHORACIC ECHOCARDIOGRAM  12/2013   Mildly dilated left ventricle with mild to moderately reduced function. EF 40-45% (notable improvement from  February 2015). Septal and apical hypokinesis. Mild LA dilation.     Medications Prior to Admission: Prior to Admission medications   Medication Sig Start Date End Date Taking? Authorizing Provider  apixaban (ELIQUIS) 5 MG TABS tablet Take 5 mg by mouth 2 (two) times daily.    [provider]  aspirin EC 81 MG tablet Take 1 tablet (81 mg total) by mouth daily. 11/13/13   Angiulli, Lavon Paganini, PA-C  carvedilol (COREG) 6.25 MG tablet TAKE ONE TABLET BY MOUTH TWICE DAILY WITH A MEAL 05/22/17   Leonie Man, MD  co-enzyme Q-10 30 MG capsule Take 30 mg by mouth daily.    [provider]  Dulaglutide (TRULICITY) 2.95 AO/1.3YQ SOPN Inject 0.75 mg into the skin every 7 (seven) days. Saturday of each week    [provider]  ferrous sulfate 325 (65 FE) MG tablet TAKE ONE TABLET BY MOUTH ONCE DAILY WITH  BREAKFAST 03/22/15   Leonie Man, MD  furosemide (LASIX) 80 MG tablet Take 80 mg by mouth 2 (two) times daily.  08/06/14   [provider]  hydrochlorothiazide (HYDRODIURIL) 12.5 MG tablet Take 12.5 mg by mouth daily.    [provider]  ipratropium-albuterol (DUONEB) 0.5-2.5 (3) MG/3ML SOLN Take 3 mLs by nebulization every 4 (four) hours as needed (for shortness of breath).  04/13/14   [provider]  levothyroxine (SYNTHROID) 112 MCG tablet Take 1 tablet (112 mcg total) by mouth daily before breakfast. 02/05/14   Leanna Battles, MD  lisinopril (PRINIVIL,ZESTRIL) 2.5 MG tablet Take 2.5 mg by mouth daily. 08/06/14   [provider]  metolazone (ZAROXOLYN) 2.5 MG tablet Take 2.5 mg by mouth daily. TAKE AS NEEDED    [provider]  multivitamin-lutein (OCUVITE-LUTEIN) CAPS capsule Take 1 capsule by mouth daily.    [provider]  prednisoLONE acetate (PRED FORTE) 1 % ophthalmic suspension Place 1 drop into the right eye daily.    [provider]  PROAIR HFA 108 (90 BASE) MCG/ACT inhaler Inhale 1 puff into the  lungs daily. 02/06/15   [provider]  rosuvastatin (CRESTOR) 20 MG tablet Take 20 mg by mouth every other day.     [provider]  ULORIC 40 MG tablet Take 40 mg by mouth daily. 10/04/17   [provider]  umeclidinium-vilanterol (ANORO ELLIPTA) 62.5-25 MCG/INH AEPB Inhale 1 puff into the lungs daily.    [provider]     Allergies:    Allergies  Allergen Reactions  . Allopurinol Hives  . Tape Rash and Other (See Comments)    Uncoded Allergy. Allergen: Tape Adhesive tape    Social History:   Social History   Socioeconomic History  . Marital status: Married    Spouse name: Not on file  . Number of children: Not on file  . Years of education: Not on file  . Highest education level: Not on file  Occupational History  . Occupation: retired  Scientific laboratory technician  . Financial resource strain: Not on file  . Food insecurity    Worry: Not on file    Inability: Not on file  . Transportation needs    Medical: Not on file    Non-medical: Not on file  Tobacco Use  . Smoking status: Former Smoker    Packs/day: 0.50    Years: 39.00    Pack years: 19.50    Types: Cigarettes    Quit date: 08/28/1993    Years since quitting: 25.8  . Smokeless tobacco: Never Used  Substance and Sexual Activity  . Alcohol use: No    Comment: quit 95  . Drug use: No  . Sexual activity: Not on file  Lifestyle  . Physical activity    Days per week: Not on file    Minutes per session: Not on file  . Stress: Not on file  Relationships  . Social Herbalist on phone: Not on file    Gets together: Not on file    Attends religious service: Not on file    Active member of club or organization: Not on file    Attends meetings of clubs or organizations: Not on file    Relationship status: Not on file  . Intimate partner violence    Fear of current or ex partner: Not on file    Emotionally abused: Not on file    Physically abused: Not on file    Forced sexual  activity: Not on file  Other Topics Concern  . Not on file  Social History Narrative   Lives with husband and son in [place].  She is currently retired.   Former smoker quit in 1995.  Family History:   The patient's family history is negative for Colon cancer, Esophageal cancer, Rectal cancer, and Stomach cancer.    ROS:  Please see the history of present illness.  All other ROS reviewed and negative.     Physical Exam/Data:  There were no vitals filed for this visit. No intake or output data in the 24 hours ending 06/12/19 1647 Last 3 Weights 06/11/2019 04/22/2019 05/06/2018  Weight (lbs) 253 lb 11.2 oz 246 lb 12.8 oz 247 lb 11.2 oz  Weight (kg) 115.078 kg 111.948 kg 112.356 kg     There is no height or weight on file to calculate BMI.  General:  Pleasant morbidly obese lady sitting in bedside chair in NAD HEENT: clouded lens of right eye; left eye anicteric Neck: +JVD Endocrine:  No thryomegaly Vascular: No carotid bruits; distal pulses 2+ bilaterally Cardiac:  normal S1, S2; IRIR; distant heart sounds but no obvious murmur, gallop, or rub Lungs:  Decreased breath sounds throughout with crackles at lung bases Abd: soft, nontender, no hepatomegaly  Ext: 3+ edema Musculoskeletal:  No deformities, BUE and BLE strength normal and equal Skin: warm and dry  Neuro:  CNs 2-12 intact, no focal abnormalities noted Psych:  Normal affect    EKG:  The ECG that was done 06/11/2019 was personally reviewed and demonstrates atrial fibrillation with rate 83 bpm, chronic LBBB  Relevant CV Studies: NST 07/2014: Overall Impression:  Intermediate risk stress nuclear study with large, severe intensity perfusion defect of the anterior wall, apex and inferior apex, which is partially reversible (SDS 8) suggesting scar or possibly hibernating myocardium which is ischemic.  LV Wall Motion:  Non-gated due to ectopy  Echocardiogram 12/2017: Study Conclusions   - Left ventricle: Septal and  apical hypokinesis The cavity size was  mildly dilated. Wall thickness was increased in a pattern of mild  LVH. Systolic function was mildly to moderately reduced. The  estimated ejection fraction was in the range of 40% to 45%.  - Mitral valve: Calcified annulus. Mildly thickened leaflets .  - Left atrium: The atrium was moderately dilated.  - Atrial septum: No defect or patent foramen ovale was identified.   Laboratory Data:  High Sensitivity Troponin:  No results for input(s): TROPONINIHS in the last 720 hours.    ChemistryNo results for input(s): NA, K, CL, CO2, GLUCOSE, BUN, CREATININE, CALCIUM, GFRNONAA, GFRAA, ANIONGAP in the last 168 hours.  No results for input(s): PROT, ALBUMIN, AST, ALT, ALKPHOS, BILITOT in the last 168 hours. HematologyNo results for input(s): WBC, RBC, HGB, HCT, MCV, MCH, MCHC, RDW, PLT in the last 168 hours. BNPNo results for input(s): BNP, PROBNP in the last 168 hours.  DDimer No results for input(s): DDIMER in the last 168 hours.   Radiology/Studies:  No results found.  Assessment and Plan:   1. Acute on chronic combined CHF: patient presented with SOB, LE edema, orthopnea, and weight gain of 20lbs, intolerant to po diuretics. Seen by Dr. Ellyn Hack outpatient 06/11/2019 and recommended for admission for IV diuresis. Echo 12/2013 with EF 40-45%, improved from 20-25% 09/2013. Her last ischemic evaluation was a NST 07/2014 which showed a scar vs hibernating myocardium which is ischemic.  - Will check BMET and BNP at this time - Will check a CXR - Will check an echocardiogram - Will start IV lasix 80mg  BID - Will continue metolazone 2.5mg  daily (to start tomorrow) - Will transition from lisinopril to losartan 25mg  daily in anticipation of possible drop in EF, as this  will make transition to entresto easier. - Continue carvedilol - Strict I&Os and daily weights - Monitor electrolytes closely and replete to maintain K >4, Mg >2  2. CAD s/p CABG in 2015:  no anginal complaints. EKG yesterday with chronic LBBB.  - Will check an echocardiogram - if EF low, may need to consider repeat ischemic evaluation - Continue aspirin and statin - Continue carvedilol  3. Persistent atrial fibrillation: likely permanent. Rate is well controlled on carvedilol. No complaints of bleeding with eliquis - Continue carvedilol for rate control - Continue eliquis for stroke ppx given CHA2DS2-VASc Score of 7 (CHF, HTN, DM, Vascular, Female, Age >23)   4. HTN: BP stable - Managed in the context of CHF/CAD/Afib  5. HLD: no recent lipids on file - Will check FLP in AM - Continue statin  6. DM type 2: A1C reportedly well controlled at 5.4 02/2019 - ISS while admitted  7. Hypothyroidism - Continue levothyroxine  8. LBBB: chronic  9. CKD stage 3: Cr pending. Last Cr reported to be 1.9 - Will continue to monitor closely with diuresis.    Severity of Illness: The appropriate patient status for this patient is INPATIENT. Inpatient status is judged to be reasonable and necessary in order to provide the required intensity of service to ensure the patient's safety. The patient's presenting symptoms, physical exam findings, and initial radiographic and laboratory data in the context of their chronic comorbidities is felt to place them at high risk for further clinical deterioration. Furthermore, it is not anticipated that the patient will be medically stable for discharge from the hospital within 2 midnights of admission. The following factors support the patient status of inpatient.   " The patient's presenting symptoms include SOB, LE edema, orthopnea, and weight gain. " The worrisome physical exam findings include significant LE edema and crackles on lung exam. " The initial radiographic and laboratory data are worrisome because of pending - patient is a direct admit. " The chronic co-morbidities include CHF, CAD, HTN, DM, Afib, HLD, CKD.   * I certify that at the  point of admission it is my clinical judgment that the patient will require inpatient hospital care spanning beyond 2 midnights from the point of admission due to high intensity of service, high risk for further deterioration and high frequency of surveillance required.*    For questions or updates, please contact [place] Please consult [email or web address] for contact info under        Signed, Abigail Butts, PA-C  06/12/2019 4:47 PM

## 2019-06-12 NOTE — Progress Notes (Signed)
Received pt without Iv access, day shift attempted to insert but failed. Ended up giving Lasix 80 mg iv at 2059 after IV team has inserted PIV.

## 2019-06-12 NOTE — Progress Notes (Signed)
Paged Dr.harding on call MD for orders.

## 2019-06-12 NOTE — Telephone Encounter (Signed)
Patient had appointment 06/11/19 -- admittted 06/12/19

## 2019-06-13 ENCOUNTER — Other Ambulatory Visit: Payer: Self-pay

## 2019-06-13 ENCOUNTER — Inpatient Hospital Stay (HOSPITAL_COMMUNITY): Payer: Medicare Other

## 2019-06-13 DIAGNOSIS — I361 Nonrheumatic tricuspid (valve) insufficiency: Secondary | ICD-10-CM

## 2019-06-13 LAB — BASIC METABOLIC PANEL
Anion gap: 12 (ref 5–15)
BUN: 33 mg/dL — ABNORMAL HIGH (ref 8–23)
CO2: 26 mmol/L (ref 22–32)
Calcium: 9.7 mg/dL (ref 8.9–10.3)
Chloride: 99 mmol/L (ref 98–111)
Creatinine, Ser: 1.67 mg/dL — ABNORMAL HIGH (ref 0.44–1.00)
GFR calc Af Amer: 34 mL/min — ABNORMAL LOW (ref >60)
GFR calc non Af Amer: 29 mL/min — ABNORMAL LOW (ref >60)
Glucose, Bld: 146 mg/dL — ABNORMAL HIGH (ref 70–99)
Potassium: 4.4 mmol/L (ref 3.5–5.1)
Sodium: 137 mmol/L (ref 135–145)

## 2019-06-13 LAB — ECHOCARDIOGRAM COMPLETE: Weight: 3915.2 oz

## 2019-06-13 LAB — GLUCOSE, CAPILLARY
Glucose-Capillary: 110 mg/dL — ABNORMAL HIGH (ref 70–99)
Glucose-Capillary: 126 mg/dL — ABNORMAL HIGH (ref 70–99)
Glucose-Capillary: 143 mg/dL — ABNORMAL HIGH (ref 70–99)
Glucose-Capillary: 128 mg/dL — ABNORMAL HIGH (ref 70–99)

## 2019-06-13 LAB — LIPID PANEL
Cholesterol: 121 mg/dL (ref 0–200)
Cholesterol: 131 mg/dL (ref 0–200)
HDL: 53 mg/dL (ref >40)
HDL: 57 mg/dL (ref >40)
LDL Cholesterol: 57 mg/dL (ref 0–99)
LDL Cholesterol: 60 mg/dL (ref 0–99)
Total CHOL/HDL Ratio: 2.3 RATIO
Total CHOL/HDL Ratio: 2.3 RATIO
Triglycerides: 55 mg/dL (ref <150)
Triglycerides: 71 mg/dL (ref <150)
VLDL: 11 mg/dL (ref 0–40)
VLDL: 14 mg/dL (ref 0–40)

## 2019-06-13 LAB — SARS CORONAVIRUS 2 (TAT 6-24 HRS): SARS Coronavirus 2: NEGATIVE

## 2019-06-13 NOTE — Progress Notes (Signed)
Progress Note  Patient Name: Elaine [name] Date of Encounter: 06/13/2019  Primary Cardiologist: Glenetta Hew, MD   Subjective   Mildly short of breath today, has not tried to ambulate significantly. Notes high urine output. Mild chest tenderness to palpation unchanged.  Inpatient Medications    Scheduled Meds: . apixaban  5 mg Oral BID  . aspirin EC  81 mg Oral Daily  . carvedilol  6.25 mg Oral BID WC  . febuxostat  40 mg Oral Daily  . ferrous sulfate  325 mg Oral Q breakfast  . furosemide  80 mg Intravenous BID  . insulin aspart  0-15 Units Subcutaneous TID WC  . insulin aspart  0-5 Units Subcutaneous QHS  . levothyroxine  112 mcg Oral QAC breakfast  . losartan  25 mg Oral Daily  . metolazone  2.5 mg Oral Daily  . prednisoLONE acetate  1 drop Right Eye Daily  . rosuvastatin  20 mg Oral QODAY  . sodium chloride flush  3 mL Intravenous Q12H  . umeclidinium-vilanterol  1 puff Inhalation Daily   Continuous Infusions: . sodium chloride     PRN Meds: sodium chloride, acetaminophen, ipratropium-albuterol, ondansetron (ZOFRAN) IV, sodium chloride flush   Vital Signs    Vitals:   06/13/19 0312 06/13/19 0322 06/13/19 0400 06/13/19 0822  BP:  (!) 147/84  132/68  Pulse:  86  100  Resp:  20  16  Temp:  97.6 F (36.4 C)  98 F (36.7 C)  TempSrc:  Oral  Oral  SpO2:  (!) 88% 95% 94%  Weight: 111 kg       Intake/Output Summary (Last 24 hours) at 06/13/2019 1016 Last data filed at 06/13/2019 0921 Gross per 24 hour  Intake 483 ml  Output 1900 ml  Net -1417 ml   Last 3 Weights 06/13/2019 06/12/2019 06/11/2019  Weight (lbs) 244 lb 11.2 oz 252 lb 4.8 oz 253 lb 11.2 oz  Weight (kg) 110.995 kg 114.443 kg 115.078 kg      Telemetry    Atrial fib- Personally Reviewed  ECG    Atrial fib, lbbb - Personally Reviewed  Physical Exam   GEN: No acute distress.  Sitting up in chair. Neck: JVD to ear sitting upright. Cardiac: irregularly irregular S1 and S2, no murmurs,  rubs, or gallops.  Respiratory: decreased lung sounds with crackles at bases GI: Soft, nontender, non-distended  MS: 3+ pitting edema bilateral LE Neuro:  Nonfocal  Psych: Normal affect   Labs    High Sensitivity Troponin:  No results for input(s): TROPONINIHS in the last 720 hours.    Chemistry Recent Labs  Lab 06/12/19 1815  NA 141  K 4.4  CL 102  CO2 28  GLUCOSE 143*  BUN 33*  CREATININE 1.72*  CALCIUM 10.1  PROT 7.3  ALBUMIN 3.7  AST 63*  ALT 20  ALKPHOS 89  BILITOT 1.1  GFRNONAA 28*  GFRAA 32*  ANIONGAP 11     Hematology Recent Labs  Lab 06/12/19 2142  WBC 4.5  RBC 3.59*  HGB 10.5*  HCT 32.1*  MCV 89.4  MCH 29.2  MCHC 32.7  RDW 16.2*  PLT 180    BNP Recent Labs  Lab 06/12/19 1815  BNP 919.9*     DDimer No results for input(s): DDIMER in the last 168 hours.   Radiology    Dg Chest 2 View  Result Date: 06/13/2019 CLINICAL DATA:  History of dyspnea with cardiac history. EXAM: CHEST - 2 VIEW COMPARISON:  09/28/2017 FINDINGS: Post median sternotomy and atrial clipping, also with changes of CABG. Heart size remains markedly enlarged. Lungs are clear with some mild interstitial prominence. Mild blunting of left constant phrenic angle may reflect small pleural effusion. No acute bone finding. IMPRESSION: 1. Cardiomegaly and mild interstitial edema. 2. Possible small left pleural effusion. 3. No other acute abnormalities. Electronically Signed   By: Zetta Bills M.D.   On: 06/13/2019 08:26    Cardiac Studies   Echo pending  Patient Profile     78 y.o. female with a PMH of CAD s/p CABG with LAA clipping, ischemic cardiomyopathy (EF 40-45% in 2015, up from 20-25%), persistent atrial fibrillation, chronic LBBB, HTN, HLD, DM type 2, hypothyroidism, CKD stage 3, who presents with acute on chronic combined CHF.   Assessment & Plan    1. Acute on chronic combined systolic and diastolic heart failure: Symptoms of SOB, LE edema, orthopnea, and weight  gain of 20lbs, intolerant to po diuretics.  -echo pending. If significantly dropped, will need to discuss if repeat ischemic evaluation would be beneficial. Her last ischemic evaluation was a NST 07/2014 which showed a scar vs hibernating myocardium -continue IV lasix 80mg  BID, metolazone 2.5mg  daily - Will transition from lisinopril to losartan 25mg  daily in anticipation of possible drop in EF, as this will make transition to entresto easier. - Continue carvedilol - Strict I&Os and daily weights - Monitor electrolytes closely and replete to maintain K >4, Mg >2 -wt from 114.4 to 111 kg today, net negative 1.4 L  Not changed today: CAD s/p CABG in 2015: - Continue aspirin and statin - Continue carvedilol  Persistent atrial fibrillation, permanent - Continue carvedilol for rate control - Continue eliquis for stroke ppx given CHA2DS2-VASc Score of 7 (CHF, HTN, DM, Vascular, Female, Age >75)   HTN HLD: DM type 2 Hypothyroidism LBBB CKD stage 3  For questions or updates, please contact [facility] Please consult [email or web address] for contact info under    Signed, Buford Dresser, MD  06/13/2019, 10:16 AM

## 2019-06-13 NOTE — Progress Notes (Signed)
Pt had 12 bts Vtach , PT denies chest pain, no dizziness, no lightheadedness. VS stable. Pt Brought down for Xray. Will pass on

## 2019-06-13 NOTE — Progress Notes (Signed)
*  PRELIMINARY RESULTS* Echocardiogram 2D Echocardiogram has been performed.  Leavy Cella 06/13/2019, 1:33 PM

## 2019-06-13 NOTE — Discharge Instructions (Addendum)
Starting tomorrow 06/17/2019, START Lasix 80mg  in the morning and 40mg  in the evening. STOP Carvedilol and START Metoprolol Succinate 100mg  daily. STOP Lisinopril and START Losartan 25mg  daily STOP Hydrochlorothiazide.  STOP Metolazone.  Continue all other medications as instructed elsewhere on discharge summary.  Heart Failure Education: 1. Weigh yourself EVERY morning after you go to the bathroom but before you eat or drink anything. Write this number down in a weight log/diary. If you gain 3 pounds overnight or 5 pounds in a week, call the office. 2. Take your medicines as prescribed. If you have concerns about your medications, please call us before you stop taking them.  3. Eat low salt foods--Limit salt (sodium) to 2000 mg per day. This will help prevent your body from holding onto fluid. Read food labels as many processed foods have a lot of sodium, especially canned goods and prepackaged meats. If you would like some assistance choosing low sodium foods, we would be happy to set you up with a nutritionist. 4. Stay as active as you can everyday. Staying active will give you more energy and make your muscles stronger. Start with 5 minutes at a time and work your way up to 30 minutes a day. Break up your activities--do some in the morning and some in the afternoon. Start with 3 days per week and work your way up to 5 days as you can.  If you have chest pain, feel short of breath, dizzy, or lightheaded, STOP. If you don't feel better after a short rest, call 911. If you do feel better, call the office to let us know you have symptoms with exercise. 5. Limit all fluids for the day to less than 2 liters. Fluid includes all drinks, coffee, juice, ice chips, soup, jello, and all other liquids.   Information on my medicine - ELIQUIS (apixaban)  Why was Eliquis prescribed for you? Eliquis was prescribed for you to reduce the risk of a blood clot forming that can cause a stroke if you have a  medical condition called atrial fibrillation (a type of irregular heartbeat).  What do You need to know about Eliquis ? Take your Eliquis TWICE DAILY - one tablet in the morning and one tablet in the evening with or without food. If you have difficulty swallowing the tablet whole please discuss with your pharmacist how to take the medication safely.  Take Eliquis exactly as prescribed by your doctor and DO NOT stop taking Eliquis without talking to the doctor who prescribed the medication.  Stopping may increase your risk of developing a stroke.  Refill your prescription before you run out.  After discharge, you should have regular check-up appointments with your healthcare provider that is prescribing your Eliquis.  In the future your dose may need to be changed if your kidney function or weight changes by a significant amount or as you get older.  What do you do if you miss a dose? If you miss a dose, take it as soon as you remember on the same day and resume taking twice daily.  Do not take more than one dose of ELIQUIS at the same time to make up a missed dose.  Important Safety Information A possible side effect of Eliquis is bleeding. You should call your healthcare provider right away if you experience any of the following: ? Bleeding from an injury or your nose that does not stop. ? Unusual colored urine (red or dark brown) or unusual colored stools (red or black). ?  Unusual bruising for unknown reasons. ? A serious fall or if you hit your head (even if there is no bleeding).  Some medicines may interact with Eliquis and might increase your risk of bleeding or clotting while on Eliquis. To help avoid this, consult your healthcare provider or pharmacist prior to using any new prescription or non-prescription medications, including herbals, vitamins, non-steroidal anti-inflammatory drugs (NSAIDs) and supplements.  This website has more information on Eliquis (apixaban):  http://[email or web address].com/eliquis/home

## 2019-06-14 DIAGNOSIS — I25810 Atherosclerosis of coronary artery bypass graft(s) without angina pectoris: Secondary | ICD-10-CM

## 2019-06-14 DIAGNOSIS — E108 Type 1 diabetes mellitus with unspecified complications: Secondary | ICD-10-CM

## 2019-06-14 DIAGNOSIS — N184 Chronic kidney disease, stage 4 (severe): Secondary | ICD-10-CM

## 2019-06-14 DIAGNOSIS — I10 Essential (primary) hypertension: Secondary | ICD-10-CM

## 2019-06-14 DIAGNOSIS — E039 Hypothyroidism, unspecified: Secondary | ICD-10-CM

## 2019-06-14 DIAGNOSIS — I4821 Permanent atrial fibrillation: Secondary | ICD-10-CM

## 2019-06-14 LAB — BASIC METABOLIC PANEL
Anion gap: 11 (ref 5–15)
BUN: 38 mg/dL — ABNORMAL HIGH (ref 8–23)
CO2: 31 mmol/L (ref 22–32)
Calcium: 9.3 mg/dL (ref 8.9–10.3)
Chloride: 95 mmol/L — ABNORMAL LOW (ref 98–111)
Creatinine, Ser: 1.85 mg/dL — ABNORMAL HIGH (ref 0.44–1.00)
GFR calc Af Amer: 30 mL/min — ABNORMAL LOW (ref >60)
GFR calc non Af Amer: 26 mL/min — ABNORMAL LOW (ref >60)
Glucose, Bld: 98 mg/dL (ref 70–99)
Potassium: 3.8 mmol/L (ref 3.5–5.1)
Sodium: 137 mmol/L (ref 135–145)

## 2019-06-14 LAB — GLUCOSE, CAPILLARY
Glucose-Capillary: 110 mg/dL — ABNORMAL HIGH (ref 70–99)
Glucose-Capillary: 128 mg/dL — ABNORMAL HIGH (ref 70–99)
Glucose-Capillary: 88 mg/dL (ref 70–99)
Glucose-Capillary: 119 mg/dL — ABNORMAL HIGH (ref 70–99)

## 2019-06-14 MED ORDER — METOPROLOL TARTRATE 25 MG PO TABS
25.0000 mg | ORAL_TABLET | Freq: Four times a day (QID) | ORAL | Status: DC
  Administered 2019-06-14 – 2019-06-15 (×4): 25 mg via ORAL
  Filled 2019-06-14 (×5): qty 1

## 2019-06-14 NOTE — Progress Notes (Signed)
Cardiology Progress Note  Patient ID: Elaine [name] MRN: [id number] DOB: [birth year] Date of Encounter: 06/14/2019  Primary Cardiologist: Glenetta Hew, MD  Subjective  Net -1.9 L, with 2.9 L of urine output.  Weights are going down, but she still remains significantly volume overloaded.  She does report she feels much better today.  ROS:  All other ROS reviewed and negative. Pertinent positives noted in the HPI.     Inpatient Medications  Scheduled Meds:  apixaban  5 mg Oral BID   aspirin EC  81 mg Oral Daily   carvedilol  6.25 mg Oral BID WC   febuxostat  40 mg Oral Daily   ferrous sulfate  325 mg Oral Q breakfast   furosemide  80 mg Intravenous BID   insulin aspart  0-15 Units Subcutaneous TID WC   insulin aspart  0-5 Units Subcutaneous QHS   levothyroxine  112 mcg Oral QAC breakfast   losartan  25 mg Oral Daily   prednisoLONE acetate  1 drop Right Eye Daily   rosuvastatin  20 mg Oral QODAY   sodium chloride flush  3 mL Intravenous Q12H   umeclidinium-vilanterol  1 puff Inhalation Daily   Continuous Infusions:  sodium chloride     PRN Meds: sodium chloride, acetaminophen, ipratropium-albuterol, ondansetron (ZOFRAN) IV, sodium chloride flush   Vital Signs   Vitals:   06/13/19 2140 06/14/19 0000 06/14/19 0542 06/14/19 0907  BP: 103/61 121/64 (!) 116/44   Pulse: 73 85 65   Resp: 14 16 16    Temp: 97.7 F (36.5 C) 97.6 F (36.4 C) 98 F (36.7 C)   TempSrc: Oral Oral Oral   SpO2: 100% 100% 100% 100%  Weight:   109.7 kg     Intake/Output Summary (Last 24 hours) at 06/14/2019 1113 Last data filed at 06/14/2019 0600 Gross per 24 hour  Intake 647 ml  Output 2000 ml  Net -1353 ml   Last 3 Weights 06/14/2019 06/13/2019 06/12/2019  Weight (lbs) 241 lb 12.8 oz 244 lb 11.2 oz 252 lb 4.8 oz  Weight (kg) 109.68 kg 110.995 kg 114.443 kg      Telemetry  Overnight telemetry shows atrial fibrillation with heart rates in the 90-100 range which I personally  reviewed.   ECG  The most recent ECG shows atrial fibrillation with heart rate in the 90s, QRS noted to be 168, which I personally reviewed.   Physical Exam   Vitals:   06/13/19 2140 06/14/19 0000 06/14/19 0542 06/14/19 0907  BP: 103/61 121/64 (!) 116/44   Pulse: 73 85 65   Resp: 14 16 16    Temp: 97.7 F (36.5 C) 97.6 F (36.4 C) 98 F (36.7 C)   TempSrc: Oral Oral Oral   SpO2: 100% 100% 100% 100%  Weight:   109.7 kg      Intake/Output Summary (Last 24 hours) at 06/14/2019 1113 Last data filed at 06/14/2019 0600 Gross per 24 hour  Intake 647 ml  Output 2000 ml  Net -1353 ml    Last 3 Weights 06/14/2019 06/13/2019 06/12/2019  Weight (lbs) 241 lb 12.8 oz 244 lb 11.2 oz 252 lb 4.8 oz  Weight (kg) 109.68 kg 110.995 kg 114.443 kg    Body mass index is 44.23 kg/m.  General: Well nourished, well developed, in no acute distress Head: Atraumatic, normal size  Eyes: PEERLA, EOMI  Neck: JVD noted 13 to 15 cm of water Endocrine: No thryomegaly Cardiac: Normal S1, S2; RRR; no murmurs, rubs, or gallops Lungs: Crackles  at the lung bases bilaterally Abd: Soft, nontender, no hepatomegaly  Ext: 2+ pitting edema to mid shin Musculoskeletal: No deformities, BUE and BLE strength normal and equal Skin: Warm and dry, no rashes   Neuro: Alert and oriented to person, place, time, and situation, CNII-XII grossly intact, no focal deficits  Psych: Normal mood and affect   Labs  High Sensitivity Troponin:  No results for input(s): TROPONINIHS in the last 720 hours.   Cardiac EnzymesNo results for input(s): TROPONINI in the last 168 hours. No results for input(s): TROPIPOC in the last 168 hours.  Chemistry Recent Labs  Lab 06/12/19 1815 06/13/19 0908 06/14/19 0450  NA 141 137 137  K 4.4 4.4 3.8  CL 102 99 95*  CO2 28 26 31   GLUCOSE 143* 146* 98  BUN 33* 33* 38*  CREATININE 1.72* 1.67* 1.85*  CALCIUM 10.1 9.7 9.3  PROT 7.3  --   --   ALBUMIN 3.7  --   --   AST 63*  --   --   ALT  20  --   --   ALKPHOS 89  --   --   BILITOT 1.1  --   --   GFRNONAA 28* 29* 26*  GFRAA 32* 34* 30*  ANIONGAP 11 12 11     Hematology Recent Labs  Lab 06/12/19 2142  WBC 4.5  RBC 3.59*  HGB 10.5*  HCT 32.1*  MCV 89.4  MCH 29.2  MCHC 32.7  RDW 16.2*  PLT 180   BNP Recent Labs  Lab 06/12/19 1815  BNP 919.9*    DDimer No results for input(s): DDIMER in the last 168 hours.   Radiology  Dg Chest 2 View  Result Date: 06/13/2019 CLINICAL DATA:  History of dyspnea with cardiac history. EXAM: CHEST - 2 VIEW COMPARISON:  09/28/2017 FINDINGS: Post median sternotomy and atrial clipping, also with changes of CABG. Heart size remains markedly enlarged. Lungs are clear with some mild interstitial prominence. Mild blunting of left constant phrenic angle may reflect small pleural effusion. No acute bone finding. IMPRESSION: 1. Cardiomegaly and mild interstitial edema. 2. Possible small left pleural effusion. 3. No other acute abnormalities. Electronically Signed   By: Zetta Bills M.D.   On: 06/13/2019 08:26    Cardiac Studies  TTE 06/13/2019  1. Left ventricular ejection fraction, by visual estimation, is <20%. The left ventricle has severely decreased function. There is mildly increased left ventricular hypertrophy.  2. Left ventricular diastolic function could not be evaluated pattern of LV diastolic filling.  3. Global right ventricle has normal systolic function.The right ventricular size is normal. No increase in right ventricular wall thickness.  4. Left atrial size was normal.  5. Right atrial size was normal.  6. Moderate calcification of the mitral valve leaflet(s).  7. Moderate thickening of the mitral valve leaflet(s).  8. The mitral valve is abnormal. Trace mitral valve regurgitation.  9. The tricuspid valve is normal in structure. Tricuspid valve regurgitation is mild. 10. The aortic valve is tricuspid Aortic valve regurgitation is trivial by color flow Doppler. Mild to  moderate aortic valve sclerosis/calcification without any evidence of aortic stenosis. 11. The pulmonic valve was normal in structure. Pulmonic valve regurgitation is trivial by color flow Doppler. 12. Moderately elevated pulmonary artery systolic pressure. 13. The inferior vena cava is normal in size with greater than 50% respiratory variability, suggesting right atrial pressure of 3 mmHg. 14. The atrial septum is grossly normal.  Patient Profile  [name]  is a 78 y.o. female with history of CAD status post CABG, ischemic cardiomyopathy, persistent atrial fibrillation on Eliquis, chronic left bundle branch block (QRS 168), diabetes, hypothyroidism, CKD 3 who was admitted on 10/15 for acute decompensated systolic heart failure.  Assessment & Plan  1.  Acute decompensated systolic heart failure, ejection fraction less than 20% -Echo yesterday shows drop in ejection fraction to less than 20% from 40 to 45% in 2015 -We will continue IV Lasix 80 mg twice daily, and hold metolazone for now -I think we will do metolazone 2.5 every other day -Continue losartan 25 mg daily for afterload reduction -Strict I's and O's daily -We will aim for 2 to 3 L net negative daily  2.  Systolic heart failure, left bundle branch block with QRS 168, prior CAD with CABG -I do suspect dyssynchrony plays a great role in her newly dropped ejection fraction.  She also was noted to be in A. fib with RVR.  We really have limited options for good rate control with her, I would not use digoxin at this time given kidney function.  We will have to continue metoprolol for now.  I highly doubt any attempts at restoration of sinus rhythm will be fruitful here.  I do wonder if the dyssynchrony and atrial fibrillation are just too much for her at this time.  She will need an EP evaluation for this, to be considered for possible AV node ablation with BiV ventricular pacer.  Also, could consider repeat ischemic evaluation.   Nonetheless, we will get her as dry as we can and then we can reassess these other options as stated above.  3.  Atrial fibrillation with RVR, permanent -Remains on Eliquis -No great options for rate control other than beta-blocker at this time.  I really do not think rhythm control will be beneficial for her either.  Digoxin will be great, but kidney function not the best. -I will switch her to metoprolol tartrate 25 mg every 6 and titrate up for effective rate control for now.  This will need to be switched to succinate prior to discharge for guideline directed medical therapy. -We will continue to get her as dry as possible to see if this helps.  We will allow for lenient rate control at this point  4.  CAD status post CABG -Beta-blocker, aspirin, statin  5.  Diabetes -Sliding-scale insulin  6.  Hypothyroidism -Home Synthroid  7.  CKD stage III -We will continue with 80 mg IV Lasix twice daily and likely metolazone every other day -We will to carefully watch her creatinine daily  FEN -Salt restricted diet -Daily BMP -DVT PPx: Eliquis -CODE STATUS: Full   For questions or updates, please contact [facility] Please consult [email or web address] for contact info under        Signed, Lake Bells T. Audie Box, [place]  06/14/2019 11:13 AM

## 2019-06-15 LAB — GLUCOSE, CAPILLARY
Glucose-Capillary: 88 mg/dL (ref 70–99)
Glucose-Capillary: 115 mg/dL — ABNORMAL HIGH (ref 70–99)
Glucose-Capillary: 169 mg/dL — ABNORMAL HIGH (ref 70–99)
Glucose-Capillary: 119 mg/dL — ABNORMAL HIGH (ref 70–99)

## 2019-06-15 LAB — BASIC METABOLIC PANEL
Anion gap: 11 (ref 5–15)
BUN: 46 mg/dL — ABNORMAL HIGH (ref 8–23)
CO2: 31 mmol/L (ref 22–32)
Calcium: 8.8 mg/dL — ABNORMAL LOW (ref 8.9–10.3)
Chloride: 97 mmol/L — ABNORMAL LOW (ref 98–111)
Creatinine, Ser: 2.25 mg/dL — ABNORMAL HIGH (ref 0.44–1.00)
GFR calc Af Amer: 23 mL/min — ABNORMAL LOW (ref >60)
GFR calc non Af Amer: 20 mL/min — ABNORMAL LOW (ref >60)
Glucose, Bld: 85 mg/dL (ref 70–99)
Potassium: 3.9 mmol/L (ref 3.5–5.1)
Sodium: 139 mmol/L (ref 135–145)

## 2019-06-15 MED ORDER — METOPROLOL SUCCINATE ER 100 MG PO TB24
100.0000 mg | ORAL_TABLET | Freq: Every day | ORAL | Status: DC
  Administered 2019-06-15 – 2019-06-16 (×2): 100 mg via ORAL
  Filled 2019-06-15 (×2): qty 1

## 2019-06-15 NOTE — Progress Notes (Signed)
Cardiology Progress Note  Patient ID: Elaine [name] MRN: [id number] DOB: 1940/10/30 Date of Encounter: 06/15/2019  Primary Cardiologist: Glenetta Hew, MD  Subjective  Net -700 cc yesterday.  Creatinine did bump a bit up to 2.25.  She reports she is does feel better.  JVD improved on exam, still has lower extremity edema, but I suspect this is venous insufficiency.  ROS:  All other ROS reviewed and negative. Pertinent positives noted in the HPI.     Inpatient Medications  Scheduled Meds: . apixaban  5 mg Oral BID  . aspirin EC  81 mg Oral Daily  . febuxostat  40 mg Oral Daily  . ferrous sulfate  325 mg Oral Q breakfast  . insulin aspart  0-15 Units Subcutaneous TID WC  . insulin aspart  0-5 Units Subcutaneous QHS  . levothyroxine  112 mcg Oral QAC breakfast  . losartan  25 mg Oral Daily  . metoprolol succinate  100 mg Oral Daily  . prednisoLONE acetate  1 drop Right Eye Daily  . rosuvastatin  20 mg Oral QODAY  . sodium chloride flush  3 mL Intravenous Q12H  . umeclidinium-vilanterol  1 puff Inhalation Daily   Continuous Infusions: . sodium chloride     PRN Meds: sodium chloride, acetaminophen, ipratropium-albuterol, ondansetron (ZOFRAN) IV, sodium chloride flush   Vital Signs   Vitals:   06/15/19 0621 06/15/19 0845 06/15/19 0908 06/15/19 1130  BP: (!) 100/50 99/60  103/65  Pulse: (!) 59   67  Resp: 18   17  Temp: 98.3 F (36.8 C)   98.4 F (36.9 C)  TempSrc: Oral   Oral  SpO2: 95% 100% 97% 97%  Weight: 109.4 kg       Intake/Output Summary (Last 24 hours) at 06/15/2019 1137 Last data filed at 06/15/2019 1131 Gross per 24 hour  Intake 780 ml  Output 1625 ml  Net -845 ml   Last 3 Weights 06/15/2019 06/14/2019 06/13/2019  Weight (lbs) 241 lb 3.2 oz 241 lb 12.8 oz 244 lb 11.2 oz  Weight (kg) 109.408 kg 109.68 kg 110.995 kg      Telemetry  Overnight telemetry shows much improved rates of her atrial fibrillation in the 70-80 range.  Physical Exam   Vitals:    06/15/19 0621 06/15/19 0845 06/15/19 0908 06/15/19 1130  BP: (!) 100/50 99/60  103/65  Pulse: (!) 59   67  Resp: 18   17  Temp: 98.3 F (36.8 C)   98.4 F (36.9 C)  TempSrc: Oral   Oral  SpO2: 95% 100% 97% 97%  Weight: 109.4 kg        Intake/Output Summary (Last 24 hours) at 06/15/2019 1137 Last data filed at 06/15/2019 1131 Gross per 24 hour  Intake 780 ml  Output 1625 ml  Net -845 ml    Last 3 Weights 06/15/2019 06/14/2019 06/13/2019  Weight (lbs) 241 lb 3.2 oz 241 lb 12.8 oz 244 lb 11.2 oz  Weight (kg) 109.408 kg 109.68 kg 110.995 kg    Body mass index is 44.12 kg/m.  General: Well nourished, well developed, in no acute distress Head: Atraumatic, normal size  Eyes: PEERLA, EOMI  Neck: JVD improved around 7 to 8 cm of water today Endocrine: No thryomegaly Cardiac: Irregular rhythm, no murmurs rubs or gallops Lungs: Crackles at the lung bases bilaterally Abd: Soft, nontender, no hepatomegaly  Ext: 2+ pitting edema to mid shin, chronic venous insufficiency changes noted Musculoskeletal: No deformities, BUE and BLE strength normal and equal  Skin: Warm and dry, no rashes   Neuro: Alert and oriented to person, place, time, and situation, CNII-XII grossly intact, no focal deficits  Psych: Normal mood and affect   Labs  High Sensitivity Troponin:  No results for input(s): TROPONINIHS in the last 720 hours.   Cardiac EnzymesNo results for input(s): TROPONINI in the last 168 hours. No results for input(s): TROPIPOC in the last 168 hours.  Chemistry Recent Labs  Lab 06/12/19 1815 06/13/19 0908 06/14/19 0450 06/15/19 0624  NA 141 137 137 139  K 4.4 4.4 3.8 3.9  CL 102 99 95* 97*  CO2 28 26 31 31   GLUCOSE 143* 146* 98 85  BUN 33* 33* 38* 46*  CREATININE 1.72* 1.67* 1.85* 2.25*  CALCIUM 10.1 9.7 9.3 8.8*  PROT 7.3  --   --   --   ALBUMIN 3.7  --   --   --   AST 63*  --   --   --   ALT 20  --   --   --   ALKPHOS 89  --   --   --   BILITOT 1.1  --   --   --    GFRNONAA 28* 29* 26* 20*  GFRAA 32* 34* 30* 23*  ANIONGAP 11 12 11 11     Hematology Recent Labs  Lab 06/12/19 2142  WBC 4.5  RBC 3.59*  HGB 10.5*  HCT 32.1*  MCV 89.4  MCH 29.2  MCHC 32.7  RDW 16.2*  PLT 180   BNP Recent Labs  Lab 06/12/19 1815  BNP 919.9*    DDimer No results for input(s): DDIMER in the last 168 hours.   Radiology  No results found.  Cardiac Studies  TTE 06/13/2019  1. Left ventricular ejection fraction, by visual estimation, is <20%. The left ventricle has severely decreased function. There is mildly increased left ventricular hypertrophy.  2. Left ventricular diastolic function could not be evaluated pattern of LV diastolic filling.  3. Global right ventricle has normal systolic function.The right ventricular size is normal. No increase in right ventricular wall thickness.  4. Left atrial size was normal.  5. Right atrial size was normal.  6. Moderate calcification of the mitral valve leaflet(s).  7. Moderate thickening of the mitral valve leaflet(s).  8. The mitral valve is abnormal. Trace mitral valve regurgitation.  9. The tricuspid valve is normal in structure. Tricuspid valve regurgitation is mild. 10. The aortic valve is tricuspid Aortic valve regurgitation is trivial by color flow Doppler. Mild to moderate aortic valve sclerosis/calcification without any evidence of aortic stenosis. 11. The pulmonic valve was normal in structure. Pulmonic valve regurgitation is trivial by color flow Doppler. 12. Moderately elevated pulmonary artery systolic pressure. 13. The inferior vena cava is normal in size with greater than 50% respiratory variability, suggesting right atrial pressure of 3 mmHg. 14. The atrial septum is grossly normal.  Patient Profile  Elaine [name] is a 78 y.o. female with history of CAD status post CABG, ischemic cardiomyopathy, persistent atrial fibrillation on Eliquis, chronic left bundle branch block (QRS 168), diabetes,  hypothyroidism, CKD 3 who was admitted on 10/15 for acute decompensated systolic heart failure.  Assessment & Plan  1.  Acute decompensated systolic heart failure, ejection fraction less than 20% -Echo yesterday shows drop in ejection fraction to less than 20% from 40 to 45% in 2015 -She looks much better today on my examination, I will hold her Lasix today. -I will have PT evaluate  her as I think she probably will be able to go home tomorrow -Strict I's and O's -I transitioned her metoprolol to 100 mg of extended release formulation she had better rate control on this -She will remain on losartan  2.  Systolic heart failure, left bundle branch block with QRS 168, prior CAD with CABG -A. fib is much better rate controlled today -Etiology of reduction could be due to A. fib with RVR in the setting of significant dyssynchrony with QRS of 168 ms -I suspect she will need to be reevaluated as an outpatient and possibly need EP evaluation for a BiV pacer, but this can occur as an outpatient -Could also consider a repeat ischemia evaluation as an outpatient.  She has no symptoms suggest worsening CAD despite her CABG.  3.  Atrial fibrillation with RVR, permanent -Remains on Eliquis -Rates are much improved and I have transitioned her to 100 mg of metoprolol succinate  4.  CAD status post CABG -Beta-blocker, aspirin, statin  5.  Diabetes -Sliding-scale insulin  6.  Hypothyroidism -Home Synthroid  7.  CKD stage III -She has had a rise in her serum creatinine up to 2.25 today.  She did get metolazone daily for 3 days in addition to her Lasix.  I think this may have played a small role in that.  She also appears to be a bit more stable from a volume standpoint today.  As long as her creatinine does not continue to rise tomorrow, I think she can be transitioned to oral and likely discharged tomorrow.  FEN -Salt restricted diet -Daily BMP -DVT PPx: Eliquis -CODE STATUS: Full -Disposition: We  will get PT to evaluate her for any needs going home I anticipate discharge in the next 1 to 2 days  For questions or updates, please contact [place] Please consult [email or web address] for contact info under   Signed, Lake Bells T. Audie Box, [place]  06/15/2019 11:37 AM

## 2019-06-15 NOTE — Plan of Care (Signed)

## 2019-06-16 ENCOUNTER — Other Ambulatory Visit: Payer: Self-pay | Admitting: Student

## 2019-06-16 ENCOUNTER — Telehealth: Payer: Self-pay | Admitting: Cardiology

## 2019-06-16 DIAGNOSIS — N179 Acute kidney failure, unspecified: Secondary | ICD-10-CM

## 2019-06-16 DIAGNOSIS — I447 Left bundle-branch block, unspecified: Secondary | ICD-10-CM

## 2019-06-16 DIAGNOSIS — E039 Hypothyroidism, unspecified: Secondary | ICD-10-CM

## 2019-06-16 DIAGNOSIS — I255 Ischemic cardiomyopathy: Secondary | ICD-10-CM

## 2019-06-16 DIAGNOSIS — N1832 Chronic kidney disease, stage 3b: Secondary | ICD-10-CM

## 2019-06-16 DIAGNOSIS — E785 Hyperlipidemia, unspecified: Secondary | ICD-10-CM

## 2019-06-16 LAB — BASIC METABOLIC PANEL
Anion gap: 14 (ref 5–15)
BUN: 53 mg/dL — ABNORMAL HIGH (ref 8–23)
CO2: 27 mmol/L (ref 22–32)
Calcium: 8.6 mg/dL — ABNORMAL LOW (ref 8.9–10.3)
Chloride: 97 mmol/L — ABNORMAL LOW (ref 98–111)
Creatinine, Ser: 2.27 mg/dL — ABNORMAL HIGH (ref 0.44–1.00)
GFR calc Af Amer: 23 mL/min — ABNORMAL LOW (ref >60)
GFR calc non Af Amer: 20 mL/min — ABNORMAL LOW (ref >60)
Glucose, Bld: 93 mg/dL (ref 70–99)
Potassium: 3.9 mmol/L (ref 3.5–5.1)
Sodium: 138 mmol/L (ref 135–145)

## 2019-06-16 LAB — GLUCOSE, CAPILLARY
Glucose-Capillary: 103 mg/dL — ABNORMAL HIGH (ref 70–99)
Glucose-Capillary: 129 mg/dL — ABNORMAL HIGH (ref 70–99)
Glucose-Capillary: 124 mg/dL — ABNORMAL HIGH (ref 70–99)

## 2019-06-16 MED ORDER — FUROSEMIDE 40 MG PO TABS: 40.0000 mg | ORAL_TABLET | Freq: Every day | ORAL | Status: DC

## 2019-06-16 MED ORDER — LOSARTAN POTASSIUM 25 MG PO TABS: 25.0000 mg | ORAL_TABLET | Freq: Every day | ORAL | 2 refills | Status: DC

## 2019-06-16 MED ORDER — FUROSEMIDE 80 MG PO TABS: 80.0000 mg | ORAL_TABLET | Freq: Every day | ORAL | Status: DC

## 2019-06-16 MED ORDER — FUROSEMIDE 40 MG PO TABS: 40.0000 mg | ORAL_TABLET | Freq: Every day | ORAL | 2 refills | Status: DC

## 2019-06-16 MED ORDER — METOPROLOL SUCCINATE ER 100 MG PO TB24: 100.0000 mg | ORAL_TABLET | Freq: Every day | ORAL | 2 refills | Status: DC

## 2019-06-16 MED FILL — METOPROLOL SUCCINATE ER 100: 100 | 30 days supply | Qty: 30 | Fill #0

## 2019-06-16 MED FILL — LOSARTAN POTASSIUM 25 MG TA: 25 | 30 days supply | Qty: 30 | Fill #0

## 2019-06-16 MED FILL — FUROSEMIDE 40 MG TABLET: 40 | 30 days supply | Qty: 90 | Fill #0

## 2019-06-16 NOTE — Evaluation (Signed)
Physical Therapy Evaluation Patient Details Name: Elaine [name] MRN: [id number] DOB: 09-24-1940 Today's Date: 06/16/2019   History of Present Illness  78 y.o. female with a PMH of CAD s/p CABG with LAA clipping, ischemic cardiomyopathy (EF 40-45% in 2015, up from 20-25%), persistent atrial fibrillation, chronic LBBB, HTN, HLD, DM type 2, hypothyroidism, CKD stage 3, who presents with acute on chronic combined CHF.    Clinical Impression  Pt admitted with above diagnosis. On eval, pt required min guard assist sit to stand and min guard assist ambulation 100' with RW. Pt currently with functional limitations due to the deficits listed below (see PT Problem List). Pt will benefit from skilled PT to increase their independence and safety with mobility to allow discharge to the venue listed below.       Follow Up Recommendations Home health PT;Supervision for mobility/OOB    Equipment Recommendations  None recommended by PT    Recommendations for Other Services       Precautions / Restrictions Precautions Precautions: Fall Required Braces or Orthoses: Other Brace Other Brace: L double metal upright AFO for ambulation      Mobility  Bed Mobility Overal bed mobility: Needs Assistance             General bed mobility comments: Pt received in recliner. Assisted OOB by nursing.  Transfers Overall transfer level: Needs assistance Equipment used: Rolling walker (2 wheeled) Transfers: Sit to/from Stand Sit to Stand: Min guard         General transfer comment: increased time and effort, min guard for safety  Ambulation/Gait Ambulation/Gait assistance: Min guard Gait Distance (Feet): 100 Feet Assistive device: Rolling walker (2 wheeled) Gait Pattern/deviations: Step-through pattern;Decreased stride length Gait velocity: decreased   General Gait Details: L AFO and sneakers donned for ambulation. slow, guarded gait; no LOB noted. Ambulated on RA. Mild SOB. max HR  112  Stairs            Wheelchair Mobility    Modified Rankin (Stroke Patients Only)       Balance Overall balance assessment: Needs assistance Sitting-balance support: No upper extremity supported;Feet supported Sitting balance-Leahy Scale: Good     Standing balance support: Bilateral upper extremity supported;During functional activity Standing balance-Leahy Scale: Poor Standing balance comment: reliant on UE support                             Pertinent Vitals/Pain Pain Assessment: Faces Faces Pain Scale: Hurts little more Pain Location: BLE Pain Descriptors / Indicators: Sore;Discomfort;Grimacing;Guarding Pain Intervention(s): Monitored during session;Repositioned    Home Living Family/patient expects to be discharged to:: Private residence Living Arrangements: Alone Available Help at Discharge: Family;Friend(s);Available 24 hours/day Type of Home: Apartment Home Access: Level entry     Home Layout: One level Home Equipment: Walker - 2 wheels;Walker - 4 wheels;Bedside commode;Shower seat;Wheelchair - manual      Prior Function Level of Independence: Independent with assistive device(s)         Comments: amb with rollator. Family assists with driving and grocery shopping.     Hand Dominance   Dominant Hand: Right    Extremity/Trunk Assessment   Upper Extremity Assessment Upper Extremity Assessment: Overall WFL for tasks assessed    Lower Extremity Assessment Lower Extremity Assessment: Generalized weakness    Cervical / Trunk Assessment Cervical / Trunk Assessment: Kyphotic  Communication   Communication: No difficulties  Cognition Arousal/Alertness: Awake/alert Behavior During Therapy: WFL for tasks assessed/performed  Overall Cognitive Status: Within Functional Limits for tasks assessed                                 General Comments: very pleasant and motivated      General Comments      Exercises      Assessment/Plan    PT Assessment Patient needs continued PT services  PT Problem List Decreased strength;Decreased mobility;Obesity;Decreased balance;Pain;Cardiopulmonary status limiting activity;Decreased activity tolerance       PT Treatment Interventions Therapeutic activities;Gait training;Therapeutic exercise;Patient/family education;Balance training;Functional mobility training    PT Goals (Current goals can be found in the Care Plan section)  Acute Rehab PT Goals Patient Stated Goal: home PT Goal Formulation: With patient Time For Goal Achievement: 06/30/19 Potential to Achieve Goals: Good    Frequency Min 3X/week   Barriers to discharge        Co-evaluation               AM-PAC PT "6 Clicks" Mobility  Outcome Measure Help needed turning from your back to your side while in a flat bed without using bedrails?: A Little Help needed moving from lying on your back to sitting on the side of a flat bed without using bedrails?: A Little Help needed moving to and from a bed to a chair (including a wheelchair)?: A Little Help needed standing up from a chair using your arms (e.g., wheelchair or bedside chair)?: A Little Help needed to walk in hospital room?: A Little Help needed climbing 3-5 steps with a railing? : A Lot 6 Click Score: 17    End of Session Equipment Utilized During Treatment: Gait belt Activity Tolerance: Patient tolerated treatment well Patient left: in chair;with chair alarm set;with call bell/phone within reach Nurse Communication: Mobility status PT Visit Diagnosis: Muscle weakness (generalized) (M62.81);Difficulty in walking, not elsewhere classified (R26.2)    Time: 7026-3785 PT Time Calculation (min) (ACUTE ONLY): 16 min   Charges:   PT Evaluation $PT Eval Moderate Complexity: 1 Mod          Lorrin Goodell, PT  Office # [phone] Pager [phone]   Lorriane Shire 06/16/2019, 9:28 AM

## 2019-06-16 NOTE — Telephone Encounter (Signed)
Patient is still admitted to hospital 06/16/19

## 2019-06-16 NOTE — Progress Notes (Signed)
Progress Note  Patient Name: Elaine [name] Date of Encounter: 06/16/2019  Primary Cardiologist: Glenetta Hew, MD   Subjective   Feels like her breathing is 90% to baseline.  She was able to walk with PT.  She lives at home alone but has many family members and friends checking in on her regularly.  Inpatient Medications    Scheduled Meds: . apixaban  5 mg Oral BID  . aspirin EC  81 mg Oral Daily  . febuxostat  40 mg Oral Daily  . ferrous sulfate  325 mg Oral Q breakfast  . insulin aspart  0-15 Units Subcutaneous TID WC  . insulin aspart  0-5 Units Subcutaneous QHS  . levothyroxine  112 mcg Oral QAC breakfast  . losartan  25 mg Oral Daily  . metoprolol succinate  100 mg Oral Daily  . prednisoLONE acetate  1 drop Right Eye Daily  . rosuvastatin  20 mg Oral QODAY  . sodium chloride flush  3 mL Intravenous Q12H  . umeclidinium-vilanterol  1 puff Inhalation Daily   Continuous Infusions: . sodium chloride     PRN Meds: sodium chloride, acetaminophen, ipratropium-albuterol, ondansetron (ZOFRAN) IV, sodium chloride flush   Vital Signs    Vitals:   06/15/19 1130 06/15/19 1510 06/15/19 2057 06/16/19 0504  BP: 103/65  101/61 (!) 93/54  Pulse: 67  69 83  Resp: 17  18 18   Temp: 98.4 F (36.9 C)  98.6 F (37 C) 98.4 F (36.9 C)  TempSrc: Oral  Oral Oral  SpO2: 97% 100% 100% 93%  Weight:    109.8 kg    Intake/Output Summary (Last 24 hours) at 06/16/2019 0734 Last data filed at 06/16/2019 4098 Gross per 24 hour  Intake 1610 ml  Output 1850 ml  Net -240 ml   Last 3 Weights 06/16/2019 06/15/2019 06/14/2019  Weight (lbs) 242 lb 1.6 oz 241 lb 3.2 oz 241 lb 12.8 oz  Weight (kg) 109.816 kg 109.408 kg 109.68 kg      Telemetry    Atrial fibrillation  To sinus rhythm. - Personally Reviewed  ECG    n/a - Personally Reviewed  Physical Exam   VS:  BP 103/65 (BP Location: Right Arm)   Pulse 64   Temp 98.9 F (37.2 C) (Oral)   Resp 18   Wt 109.8 kg   SpO2 99%    BMI 44.28 kg/m  , BMI Body mass index is 44.28 kg/m. GENERAL:  Well appearing HEENT: Pupils equal round and reactive, fundi not visualized, oral mucosa unremarkable NECK:  No jugular venous distention, waveform within normal limits, carotid upstroke brisk and symmetric, no bruits LUNGS:  Clear to auscultation bilaterally HEART:  RRR.  PMI not displaced or sustained,S1 and S2 within normal limits, no S3, no S4, no clicks, no rubs, no murmurs ABD:  Flat, positive bowel sounds normal in frequency in pitch, no bruits, no rebound, no guarding, no midline pulsatile mass, no hepatomegaly, no splenomegaly EXT:  2 plus pulses throughout, 1+ bilateral LE edema, no cyanosis no clubbing SKIN:  No rashes no nodules NEURO:  Cranial nerves II through XII grossly intact, motor grossly intact throughout PSYCH:  Cognitively intact, oriented to person place and time    Labs    High Sensitivity Troponin:  No results for input(s): TROPONINIHS in the last 720 hours.    Chemistry Recent Labs  Lab 06/12/19 1815  06/14/19 0450 06/15/19 0624 06/16/19 0457  NA 141   < > 137 139 138  K 4.4   < > 3.8 3.9 3.9  CL 102   < > 95* 97* 97*  CO2 28   < > 31 31 27   GLUCOSE 143*   < > 98 85 93  BUN 33*   < > 38* 46* 53*  CREATININE 1.72*   < > 1.85* 2.25* 2.27*  CALCIUM 10.1   < > 9.3 8.8* 8.6*  PROT 7.3  --   --   --   --   ALBUMIN 3.7  --   --   --   --   AST 63*  --   --   --   --   ALT 20  --   --   --   --   ALKPHOS 89  --   --   --   --   BILITOT 1.1  --   --   --   --   GFRNONAA 28*   < > 26* 20* 20*  GFRAA 32*   < > 30* 23* 23*  ANIONGAP 11   < > 11 11 14    < > = values in this interval not displayed.     Hematology Recent Labs  Lab 06/12/19 2142  WBC 4.5  RBC 3.59*  HGB 10.5*  HCT 32.1*  MCV 89.4  MCH 29.2  MCHC 32.7  RDW 16.2*  PLT 180    BNP Recent Labs  Lab 06/12/19 1815  BNP 919.9*     DDimer No results for input(s): DDIMER in the last 168 hours.   Radiology    No  results found.  Cardiac Studies   TTE 06/13/2019 1. Left ventricular ejection fraction, by visual estimation, is <20%. The left ventricle has severely decreased function. There is mildly increased left ventricular hypertrophy. 2. Left ventricular diastolic function could not be evaluated pattern of LV diastolic filling. 3. Global right ventricle has normal systolic function.The right ventricular size is normal. No increase in right ventricular wall thickness. 4. Left atrial size was normal. 5. Right atrial size was normal. 6. Moderate calcification of the mitral valve leaflet(s). 7. Moderate thickening of the mitral valve leaflet(s). 8. The mitral valve is abnormal. Trace mitral valve regurgitation. 9. The tricuspid valve is normal in structure. Tricuspid valve regurgitation is mild. 10. The aortic valve is tricuspid Aortic valve regurgitation is trivial by color flow Doppler. Mild to moderate aortic valve sclerosis/calcification without any evidence of aortic stenosis. 11. The pulmonic valve was normal in structure. Pulmonic valve regurgitation is trivial by color flow Doppler. 12. Moderately elevated pulmonary artery systolic pressure. 13. The inferior vena cava is normal in size with greater than 50% respiratory variability, suggesting right atrial pressure of 3 mmHg. 14. The atrial septum is grossly normal.   Patient Profile     78 y.o. female with acute on chronic systolic and diastolic heart failure, LBBB, CAD s/p CABG, permanent atrial fibrillation, DM, CKD 3, and hypothyroidism admitted with acute on chronic systolic and diastolic heart failure.  Assessment & Plan    # Acute on chronic systolic and diastolic heart failure: LVEF dropped ffrom 40-45% to <20% on this admission.  This is thought to be 2/2 poor rate control in atrial fibrillation.  She will also need to see EP for consideration of Bi-V ICD if LVEF remains low.  She is back in sinus rhythm this AM.  Consider  ischemia evaluation as an outpatient.  Would switch losartan to Entresto when BP and renal function allow.  Tomorrow  start lasix 80mg  in am, 40mg  in pm.  Add Farxiga as an outpatient if there is renal recovery.  Discharge weight 109.8 kg.  She is net -3.7L this admission.  # CAD s/p CABG:  Continue rosuvastatin.  Consider ischemia evaluation as an outpatient.    # Permanent atrial fibrillation: On Eliquis. Continue metoprolol succinate.  This was switched from carvedilol for improved rate control.  She was back in sinus rhythm this AM.  WIll get EKG.  # DM: Add Wilder Glade as an outpatient if able as above.   # Acute on chronic renal failure: Creatinine has increased in the setting of diuresis.  It is stable today.  Tomorrow start lasix 80mg  in the am, 40mg  in afternoon.  Check BMP later this week.     For questions or updates, please contact [place] Please consult [email or web address] for contact info under        Signed, Skeet Latch, MD  06/16/2019, 7:34 AM

## 2019-06-16 NOTE — Telephone Encounter (Signed)
New message   Per Sande Rives setup a TOC visit with Elaine [name] on 06/25/19 at 8:15am.

## 2019-06-16 NOTE — Discharge Summary (Signed)
Discharge Summary    Patient ID: Elaine [name] MRN: [id number]; DOB: 24-Aug-1941  Admit date: 06/12/2019 Discharge date: 06/16/2019  Primary Care Provider: Marton Redwood, MD  Primary Cardiologist: Glenetta Hew, MD  Primary Electrophysiologist:  None   Discharge Diagnoses    Principal Problem:   Acute on chronic combined systolic and diastolic CHF (congestive heart failure) ([place]) Active Problems:   Permanent atrial fibrillation (HCC) - CHA2DS2-VASc Score 6, on Eliquis   CAD status post CABG   Type II diabetes mellitus with complication ([place])   Hyperlipidemia with target LDL less than 70   Essential hypertension   Obesity   Left bundle branch block (LBBB) on electrocardiogram   Ischemic cardiomyopathy   Morbid obesity- BMI 48   Acute kidney injury superimposed on chronic kidney disease (HCC)   Hypothyroidism   Allergies Allergies  Allergen Reactions   Allopurinol Hives   Tape Rash and Other (See Comments)    Uncoded Allergy. Allergen: Tape Adhesive tape    Diagnostic Studies/Procedures    Echocardiogram 06/13/2019: Impressions: 1. Left ventricular ejection fraction, by visual estimation, is <20%. The left ventricle has severely decreased function. There is mildly increased left ventricular hypertrophy.  2. Left ventricular diastolic function could not be evaluated pattern of LV diastolic filling.  3. Global right ventricle has normal systolic function.The right ventricular size is normal. No increase in right ventricular wall thickness.  4. Left atrial size was normal.  5. Right atrial size was normal.  6. Moderate calcification of the mitral valve leaflet(s).  7. Moderate thickening of the mitral valve leaflet(s).  8. The mitral valve is abnormal. Trace mitral valve regurgitation.  9. The tricuspid valve is normal in structure. Tricuspid valve regurgitation is mild. 10. The aortic valve is tricuspid Aortic valve regurgitation is trivial by color flow Doppler.  Mild to moderate aortic valve sclerosis/calcification without any evidence of aortic stenosis. 11. The pulmonic valve was normal in structure. Pulmonic valve regurgitation is trivial by color flow Doppler. 12. Moderately elevated pulmonary artery systolic pressure. 13. The inferior vena cava is normal in size with greater than 50% respiratory variability, suggesting right atrial pressure of 3 mmHg. 14. The atrial septum is grossly normal. _____________   History of Present Illness     Elaine [name] is a 78 y.o. female with a past medical history of of CAD s/p CABG with LAA clipping, ischemic cardiomyopathy with EF of 40-45% on Echo in 2015, persistent atrial fibrillation s/p left atrial appendage clipping at time of CABG on Eliquis, chronic LBBB, hypertension, hyperlipidemia, type 2 diabetes mellitus, hypothyroidism, CKD stage III who was admitted for acute on chronic combined CHF.   Elaine [name] was seen in the office by Dr. Ellyn Hack on 06/11/2019 for routine follow-up. She was overdue for a visit at that time. She reported she had been doing well from a cardiac standpoint until June/July 2020. Prior to this, she had been working to lose weight and was down to 235lbs with diet, exercise, and stable diuretic regimen. However, since June/July, she has had progressively worsening lower extremity edema, shortness of breath, orthopnea, and weight gain of 20 lbs. She was brought to the exam room in a wheelchair at that visit as she would have been considerably dyspneic. She had no complaints of chest pain or palpitations at that time. She was found to have JVD, distant heart sounds with possible friction rub and murmur, and significant lower extremity edema on exam. Ultimately, it was felt that she would benefit from  admission to the hospital for IV diuresis given significant volume overload. She presented 06/12/2019 once a bed became available.    At the time of admission, she is conversationally dyspneic  with significant lower extremity edema despite compliance with her medications and addition of metolazone recently. She has had tenderness of her left upper chest which she attributes to nerve pain following CABG. No lumps in her breast. Still no complaints of chest pain, palpitations, or syncope. No complaints of bleeding.   Hospital Course     Consultants: PT  Acute on Chronic Combined CHF  Patient admitted with acute on chronic combined CHF as stated above. BNP elevated at 919. Echocardiogram showed LVEF of <20% (down from 40-45% in 2015) with trace mitral regurgitation, mild tricuspid regurgitation, trivial pulmonic regurgitation, and moderately elevated PASP. Patient was diuresed with IV Lasix and received one dose of Metolazone on 10/16. Home Lisinopril was changed to Losartan. Lasix held on 10/18 due to rise in creatinine. Net negative 4.2 L this admission. Discharge weight 242.1 lbs (down from 252.3 lbs on admission). Lasix held on 10/18 due to rise in creatinine.  - Will plan to restart Lasix tomorrow 06/17/19 with 80mg  in the morning and 40mg  in the evening. Stop home Metolazone for now. - Continue Losartan 25mg  daily. Dr. Oval Linsey recommends transitioning to [facility] when BP and renal function allow.  - Reduction in EF possible due to atrial fibrillation with RVR in the setting of significant dyssynchrony with QRS of 174ms. She will need to see EP as outpatient for consideration of Bi-V ICD if EF remains low. - Consider ischemic evaluation as outpatient.  Permanent Atrial Fibrillation  She had improved rate control with transitioned from Coreg to Toprol-XL and was back in sinus rhythm on morning of discharge. - Continue Toprol-XL 100mg  daily. - Continue chronic anticoagulation with Eliquis 5mg  twice daily.  CAD s/p CABG  Stable. No angina throughout admission. - Continue Aspirin 81mg  daily, Toprol-XL 100mg  daily, Crestor 5mg  every other day, and Zetia 10mg  daily. - Consider outpatient  ischemic evaluation given newly reduced EF.  Hypertension - Patient has history of hypertension but BP has actually been on the softer side with systolic BP in the high 75'I to low 100's.  - Continue Toprol and Losartan as above. - Stop home HCTZ.  Hyperlipidemia Lipid panel this admission: Total Cholesterol 131, Triglycerides 71, HDL 57, LDL 60. At LDL goal of <70 given CAD. - Continue home Crestor 5mg  every other day and Zetia 10mg  daily.  Type 2 Diabetes Mellitus Managed with sliding scale insulin during admission.  - Will restart home Trelucity at discharge. - Dr. Oval Linsey recommends adding Wilder Glade as an outpatient if renal function improves.  Hypothyroidism - Continue home Synthroid.  Acute on Chronic Kidney Disease Stage III-IV Creatinine was 1.72 at office visit with Dr. Ellyn Hack on 06/12/2019. Creatinine jumped to 2.25 on 06/15/2019 after several days of aggressive IV Lasix and one dose of Metolazone. Stable at 2.27 today.  - Will recheck BMET on Thursday 06/19/2019.  Gout Patient has history of gout for which she take Febuxostat. Per UpToDate, "Gout patient with established CV disease treated with Febuxostat had a higher rate of CV death compared to those treated with Allopurinol in a CV outcomes study." Patient has been intolerant to Allopurinol in the past. Will continue Febuxostat at discharge; however, would consider looking for different long term medication going forward. Will defer to PCP.  Patient seen and examined by Dr. Oval Linsey today and determined to be stable  for discharge. Outpatient lab visit and TOC follow-up has been arranged. Medications as below. Care Management has helped arrange home health PT through [facility].   Did the patient have an acute coronary syndrome (MI, NSTEMI, STEMI, etc) this admission?:  No                               Did the patient have a percutaneous coronary intervention (stent / angioplasty)?:  No.    _____________  Discharge Vitals Blood pressure 102/71, pulse 64, temperature 98 F (36.7 C), temperature source Oral, resp. rate 19, weight 109.8 kg, SpO2 94 %.  Filed Weights   06/14/19 0542 06/15/19 0621 06/16/19 0504  Weight: 109.7 kg 109.4 kg 109.8 kg    Labs & Radiologic Studies    CBC No results for input(s): WBC, NEUTROABS, HGB, HCT, MCV, PLT in the last 72 hours. Basic Metabolic Panel Recent Labs    06/15/19 0624 06/16/19 0457  NA 139 138  K 3.9 3.9  CL 97* 97*  CO2 31 27  GLUCOSE 85 93  BUN 46* 53*  CREATININE 2.25* 2.27*  CALCIUM 8.8* 8.6*   Liver Function Tests No results for input(s): AST, ALT, ALKPHOS, BILITOT, PROT, ALBUMIN in the last 72 hours. No results for input(s): LIPASE, AMYLASE in the last 72 hours. High Sensitivity Troponin:   No results for input(s): TROPONINIHS in the last 720 hours.  BNP Invalid input(s): POCBNP D-Dimer No results for input(s): DDIMER in the last 72 hours. Hemoglobin A1C No results for input(s): HGBA1C in the last 72 hours. Fasting Lipid Panel Recent Labs    06/13/19 1940  CHOL 131  HDL 57  LDLCALC 60  TRIG 71  CHOLHDL 2.3   Thyroid Function Tests No results for input(s): TSH, T4TOTAL, T3FREE, THYROIDAB in the last 72 hours.  Invalid input(s): FREET3 _____________  Dg Chest 2 View  Result Date: 06/13/2019 CLINICAL DATA:  History of dyspnea with cardiac history. EXAM: CHEST - 2 VIEW COMPARISON:  09/28/2017 FINDINGS: Post median sternotomy and atrial clipping, also with changes of CABG. Heart size remains markedly enlarged. Lungs are clear with some mild interstitial prominence. Mild blunting of left constant phrenic angle may reflect small pleural effusion. No acute bone finding. IMPRESSION: 1. Cardiomegaly and mild interstitial edema. 2. Possible small left pleural effusion. 3. No other acute abnormalities. Electronically Signed   By: Zetta Bills M.D.   On: 06/13/2019 08:26   US Venous Img Lower  Bilateral  Result Date: 06/03/2019 CLINICAL DATA:  Bilateral lower extremity swelling. Previous right lower extremity vein harvest for CABG. EXAM: BILATERAL LOWER EXTREMITY VENOUS DOPPLER ULTRASOUND TECHNIQUE: Gray-scale sonography with graded compression, as well as color Doppler and duplex ultrasound, were performed to evaluate the deep and superficial veins of both lower extremities. Spectral Doppler was utilized to evaluate flow at rest and with distal augmentation maneuvers. A complete superficial venous insufficiency exam was performed in the upright standing position. I personally performed the technical portion of the exam. COMPARISON:  None. FINDINGS: Deep Venous System: Evaluation of the deep venous systems including the common femoral, femoral, profunda femoral, popliteal and calf veins (where visible) demonstrate no evidence of deep venous thrombosis. The vessels are compressible and demonstrate normal respiratory phasicity and response to augmentation. No evidence of the deep venous reflux. Superficial Venous System: RIGHT SFJ: Patent, normal caliber, no reflux post augmentation GSV Prox Thigh: 8 mm diameter, no reflux post augmentation GSV Mid  Thigh: 9 mm diameter, no reflux GSV Lower Thigh: Not visualized GSV at Knee: Not visualized GSV Prox Calf: Not visualized GSV Mid Calf: Not visualized GSV Distal Calf: Not visualized SPJ: Not visualized SSV Prox: Normal caliber, no reflux SSV Mid: Negative SSV Distal: Negative LEFT SFJ: Patent, normal caliber, no reflux post augmentation. GSV Prox Thigh: Negative GSV Mid Thigh: Negative GSV Lower Thigh: Negative GSV at Knee: Negative GSV Prox Calf: Negative GSV Mid Calf: Negative GSV Distal Calf: Negative SPJ: Not visualized SSV Prox: Negative SSV Mid: Negative SSV Distal: Negative Other: Significant subcutaneous edema in the calf, left worse than right. 5.3 cm complex cystic process in the left posterior popliteal fossa. IMPRESSION: 1. Normal or lower  extremity deep venous systems. No evidence of acute or chronic DVT. 2. No evidence of significant superficial saphenous venous valvular incompetence or reflux. 3. Nonvisualization of the right great saphenous vein below the mid thigh, possibly related to previous surgical resection. 4. Left Baker's cyst Electronically Signed   By: Lucrezia Europe M.D.   On: 06/03/2019 11:10   [place] Rad Eval And Mgmt  Result Date: 06/03/2019 Please refer to "Notes" to see consult details.  Disposition   Patient is being discharged home today in good condition.  Follow-up Plans & Appointments    Follow-up Information    Marton Redwood, MD.   Specialty: Internal Medicine Why: Office will call patient with appointment Contact information: [place] [address] [phone]        [facility] Follow up.   Specialty: [place] Why: [facility] will reach out to schedule days/times of physical therapy sessions.  Contact information: [place] [address] [phone]        [facility] Follow up.   Specialty: Cardiology Why: Please come by our office on Thursday 06/19/2019 for a lab visit so that we can recheck your kidney function. You can come by anytime from 7:30am to 4:00pm. Contact information: [address] [place]       Lendon Colonel, NP Follow up.   Specialties: Nurse Practitioner, Radiology, Cardiology Why: You have a follow-up visit scheduled for 06/25/2019 at 8:15am with [name], one of Dr. Allison Quarry NPs. Please arrive 15 minutes early for check-in. Contact information: [address] [place] [address] [phone]          Discharge Instructions    Diet - low sodium heart healthy   Complete by: As directed    Increase activity slowly   Complete by: As directed       Discharge Medications   Allergies as of 06/16/2019       Reactions   Allopurinol Hives   Tape Rash, Other (See Comments)   Uncoded Allergy. Allergen: Tape Adhesive tape      Medication List    STOP taking these medications   carvedilol 6.25 MG tablet Commonly known as: COREG   hydrochlorothiazide 12.5 MG tablet Commonly known as: HYDRODIURIL   lisinopril 2.5 MG tablet Commonly known as: ZESTRIL   metolazone 2.5 MG tablet Commonly known as: ZAROXOLYN     TAKE these medications   acetaminophen 650 MG CR tablet Commonly known as: TYLENOL Take 1,300 mg by mouth 2 (two) times daily.   Anoro Ellipta 62.5-25 MCG/INH Aepb Generic drug: umeclidinium-vilanterol Inhale 1 puff into the lungs daily.   aspirin EC 81 MG tablet Take 1 tablet (81 mg total) by  mouth daily.   AZO-CRANBERRY PO Take 1 tablet by mouth daily.   carboxymethylcellulose 0.5 % Soln Commonly known as: REFRESH PLUS Place 1 drop into the left eye daily.   COQ10 PO Take 300 mg by mouth See admin instructions. Take one capsule (300 mg) by mouth on Sunday, Tuesday, Thursday, Saturday night   Eliquis 5 MG Tabs tablet Generic drug: apixaban Take 5 mg by mouth 2 (two) times daily.   ezetimibe 10 MG tablet Commonly known as: ZETIA Take 10 mg by mouth daily.   febuxostat 40 MG tablet Commonly known as: ULORIC Take 40 mg by mouth daily.   ferrous sulfate 325 (65 FE) MG tablet TAKE ONE TABLET BY MOUTH ONCE DAILY WITH  BREAKFAST What changed: See the new instructions.   furosemide 40 MG tablet Commonly known as: LASIX Take 1 tablet (40 mg total) by mouth daily. Take 2 tablets (80mg  total) in the morning and 1 tablet (40 mg total) in the evening. Start taking on: June 17, 2019 What changed:   medication strength  how much to take  additional instructions   ipratropium-albuterol 0.5-2.5 (3) MG/3ML Soln Commonly known as: DUONEB Take 3 mLs by nebulization every 4 (four) hours as needed (for shortness of breath).   levothyroxine 112 MCG  tablet Commonly known as: Synthroid Take 1 tablet (112 mcg total) by mouth daily before breakfast.   losartan 25 MG tablet Commonly known as: COZAAR Take 1 tablet (25 mg total) by mouth daily. Start taking on: June 17, 2019   metoprolol succinate 100 MG 24 hr tablet Commonly known as: TOPROL-XL Take 1 tablet (100 mg total) by mouth daily. Take with or immediately following a meal. Start taking on: June 17, 2019   multivitamin with minerals Tabs tablet Take 1 tablet by mouth daily.   prednisoLONE acetate 1 % ophthalmic suspension Commonly known as: PRED FORTE Place 1 drop into the right eye daily.   PreserVision AREDS 2 Caps Take 1 capsule by mouth 2 (two) times daily.   ProAir HFA 108 (90 Base) MCG/ACT inhaler Generic drug: albuterol Inhale 2 puffs into the lungs every 6 (six) hours as needed for wheezing or shortness of breath.   rosuvastatin 5 MG tablet Commonly known as: CRESTOR Take 5 mg by mouth See admin instructions. Take one tablet (5 mg) by mouth at night on Sunday, Tuesday, Thursday, Saturday   sodium chloride 2 % ophthalmic solution Commonly known as: MURO 128 Place 1 drop into the right eye daily.   Trulicity 4.74 QV/9.5GL Sopn Generic drug: Dulaglutide Inject 0.75 mg into the skin every Thursday.   vitamin C 500 MG tablet Commonly known as: ASCORBIC ACID Take 2,000 mg by mouth daily.          Outstanding Labs/Studies   Repeat BMET on 06/19/2019.  Duration of Discharge Encounter   Greater than 30 minutes including physician time.  Signed, Darreld Mclean, PA-C 06/16/2019, 7:13 PM

## 2019-06-16 NOTE — TOC Initial Note (Addendum)
Transition of Care [facility]) - Initial/Assessment Note    Patient Details  Name: Elaine [name] MRN: [id number] Date of Birth: 1941/07/11  Transition of [facility]) CM/SW Contact:    Alberteen Sam, [place] Phone Number: [phone] 06/16/2019, 9:57 AM  Clinical Narrative:                  CSW met with patient for discharge planning and [facility] PT recommendation. Patient reports she currently lives with her 3 children and also has family and neighbor support system. Patient reports she has a very large and helpful support system. CSW informed patient of Home Health PT recommendation, patient is in agreement and reports no preference of Home Health Agency at this time. CSW confirmed with patient home address in chart is up to date. Patient agreed for referrals to be sent for Home health agency acceptance.   CSW sent referrals, patient accepted with [facility] for [facility] PT. Will need Home Health PT orders prior to discharge.   Expected Discharge Plan: [place] Barriers to Discharge: Continued Medical Work up   Patient Goals and CMS Choice Patient states their goals for this hospitalization and ongoing recovery are:: to go home CMS Medicare.gov Compare Post Acute Care list provided to:: Patient Choice offered to / list presented to : Patient  Expected Discharge Plan and Services Expected Discharge Plan: [place]       Living arrangements for the past 2 months: Single Family Home                           HH Arranged: PT          Prior Living Arrangements/Services Living arrangements for the past 2 months: Single Family Home Lives with:: Adult Children Patient language and need for interpreter reviewed:: Yes Do you feel safe going back to the place where you live?: Yes      Need for Family Participation in Patient Care: No (Comment) Care giver support system in place?: Yes (comment)   Criminal Activity/Legal Involvement Pertinent to  Current Situation/Hospitalization: No - Comment as needed  Activities of Daily Living Home Assistive Devices/Equipment: Brace (specify type)(left leg) ADL Screening (condition at time of admission) Patient's cognitive ability adequate to safely complete daily activities?: Yes Is the patient deaf or have difficulty hearing?: No Does the patient have difficulty seeing, even when wearing glasses/contacts?: Yes Does the patient have difficulty concentrating, remembering, or making decisions?: No Patient able to express need for assistance with ADLs?: Yes Does the patient have difficulty dressing or bathing?: No Independently performs ADLs?: No Communication: Needs assistance Is this a change from baseline?: Pre-admission baseline Dressing (OT): Needs assistance Is this a change from baseline?: Pre-admission baseline Grooming: Independent Feeding: Independent Bathing: Needs assistance Is this a change from baseline?: Pre-admission baseline Toileting: Needs assistance Is this a change from baseline?: Pre-admission baseline In/Out Bed: Needs assistance Is this a change from baseline?: Pre-admission baseline Walks in Home: Needs assistance Is this a change from baseline?: Pre-admission baseline Does the patient have difficulty walking or climbing stairs?: Yes Weakness of Legs: Left Weakness of Arms/Hands: None  Permission Sought/Granted Permission sought to share information with : Case Manager, Customer service manager, Family Supports Permission granted to share information with : Yes, Verbal Permission Granted     Permission granted to share info w AGENCY: [place]        Emotional Assessment Appearance:: Appears  stated age Attitude/Demeanor/Rapport: Gracious Affect (typically observed): Calm Orientation: : Oriented to Self, Oriented to Place, Oriented to  Time, Oriented to Situation Alcohol / Substance Use: Not Applicable Psych Involvement: No  (comment)  Admission diagnosis:  heart failure a fib cad Patient Active Problem List   Diagnosis Date Noted  . Acute on chronic combined systolic and diastolic CHF (congestive heart failure) ([place]) 06/11/2019  . Bilateral leg pain 10/22/2017  . Chronic combined systolic and diastolic CHF, NYHA class 2 ([place]) 10/22/2017  . Edema of both legs 05/10/2015  . Mass of right breast on mammogram 10/06/2014  . DOE (dyspnea on exertion) 08/19/2014  . Severe obesity (BMI >= 40) ([place]) 04/19/2014  . CAD status post CABG 04/19/2014  . Metabolic acidosis 71/25/2712    Class: Acute  . Acute on chronic renal insufficiency 02/02/2014  . Near syncope 02/02/2014  . Hypotension 02/02/2014  . Rash 02/02/2014  . Acute kidney failure ([place]) 02/02/2014    Class: Acute  . Pruritus 02/02/2014    Class: Chronic  . SOB (shortness of breath) 01/07/2014  . Coumadin Rx post CABG 11/17/2013  . Physical deconditioning 11/05/2013  . S/P CABG x 3- 10/23/13 10/23/2013  . Permanent atrial fibrillation (HCC) - CHA2DS2-VASc Score 6, on Eliquis 10/20/2013  . Left bundle branch block (LBBB) on electrocardiogram 10/20/2013  . Left main coronary artery disease 10/20/2013    Class: Hospitalized for  . History of acute anterior wall myocardial infarction 10/20/2013  . ICM EF by Echo 20-25% Feb 2015, recently improved to 40-45% by echo 01/13/14 10/20/2013  . Morbid obesity- BMI 48   . Diabetic neuropathy ([place])   . Chronic kidney disease, stage III (moderate)   . Obesity 12/20/2006  . Type II diabetes mellitus with complication ([place]) 92/90/9030  . Hyperlipidemia with target LDL less than 70 08/06/2006  . Essential hypertension 08/06/2006  . OSTEOARTHRITIS 08/06/2006   PCP:  Marton Redwood, MD Pharmacy:   [place], [place] [address] Phone: [phone] Fax: [phone]  [place], [place] [facility] [address] [place] [address] [place] [address] Phone: [phone] Fax: [phone]     Social Determinants of Health (SDOH) Interventions    Readmission Risk Interventions No flowsheet data found.

## 2019-06-16 NOTE — Progress Notes (Addendum)
  Discharge paperwork given: to patient and family  Reviewed with teach back  IV and telemetry disconnected  Belongings given to patient  [facility] set up, order placed for home PT, Per SW good to go home.  Medication arrived from [place] on a ride.

## 2019-06-16 NOTE — Progress Notes (Signed)
Patient discharged: Home with family  Via: Wheelchair   Discharge paperwork given: to patient and family  Reviewed with teach back  IV and telemetry disconnected  Belongings given to patient    

## 2019-06-16 NOTE — Care Management Important Message (Signed)
Important Message  Patient Details  Name: EVOLETT SOMARRIBA MRN: [id number] Date of Birth: 05/15/1941   Medicare Important Message Given:  Yes     Shelda Altes 06/16/2019, 1:18 PM

## 2019-06-17 NOTE — Telephone Encounter (Signed)
Patient contacted regarding discharge from [facility] on 06/16/2019.  Patient understands to follow up with provider [name], NP on 10/28 at 8:15 AM at [facility] office. Patient understands discharge instructions? Yes Patient understands medications and regiment? Yes Patient understands to bring all medications to this visit? Yes

## 2019-06-19 ENCOUNTER — Other Ambulatory Visit: Payer: Self-pay

## 2019-06-19 DIAGNOSIS — N179 Acute kidney failure, unspecified: Secondary | ICD-10-CM

## 2019-06-20 ENCOUNTER — Telehealth: Payer: Self-pay

## 2019-06-20 LAB — BASIC METABOLIC PANEL
BUN/Creatinine Ratio: 26 (ref 12–28)
BUN: 56 mg/dL — ABNORMAL HIGH (ref 8–27)
CO2: 25 mmol/L (ref 20–29)
Calcium: 8.6 mg/dL — ABNORMAL LOW (ref 8.7–10.3)
Chloride: 97 mmol/L (ref 96–106)
Creatinine, Ser: 2.18 mg/dL — ABNORMAL HIGH (ref 0.57–1.00)
GFR calc Af Amer: 24 mL/min/{1.73_m2} — ABNORMAL LOW (ref >59)
GFR calc non Af Amer: 21 mL/min/{1.73_m2} — ABNORMAL LOW (ref >59)
Glucose: 134 mg/dL — ABNORMAL HIGH (ref 65–99)
Potassium: 4.8 mmol/L (ref 3.5–5.2)
Sodium: 139 mmol/L (ref 134–144)

## 2019-06-20 NOTE — Telephone Encounter (Signed)
-----   Message from Elaine [name], [place] sent at 06/20/2019 11:55 AM EDT ----- Please notify patient of results: Kidney function is still elevated but stable from discharge so OK to continue current medications for now. Patient should keep follow-up visit with [name] on 06/25/2019.

## 2019-06-20 NOTE — Telephone Encounter (Signed)
Notes recorded by Frederik Schmidt, RN on 06/20/2019 at 12:02 PM EDT  The patient has been notified of the result and verbalized understanding. All questions (if any) were answered.  Frederik Schmidt, RN 06/20/2019 12:02 PM

## 2019-06-24 NOTE — Progress Notes (Signed)
Cardiology Office Note   Date:  06/25/2019   ID:  Elaine [name], DOB [birth year], MRN [id number]  PCP:  Marton Redwood, MD  Cardiologist:  Dr. Ellyn Hack  No chief complaint on file.    History of Present Illness: Elaine [name] is a 78 y.o. female who presents for ongoing assessment and management of coronary artery disease, status post CABG with left atrial appendage clipping, ischemic cardiomyopathy with an EF of 40% to 45% per echo in 2015, persistent atrial fibrillation status post left atrial appendage clipping at the time of CABG and remains on Eliquis, chronic left bundle branch block, hypertension, hyperlipidemia, chronic combined CHF, with other history to include type 2 diabetes, hypothyroidism, chronic kidney disease stage III.  The patient was admitted to the hospital after being seen by Dr. Ellyn Hack in his office with possible friction rub and murmur along with significant lower extremity edema and evidence of decompensated CHF.  She was admitted on 06/12/2019.  Repeat echocardiogram revealed significantly reduced LVEF less than 20% with trace mitral regurg mild tricuspid regurg trivial pulmonic regurgitation and moderately elevated PASP.  The patient was given IV diuresis and 1 dose of metolazone.  Creatinine became elevated during hospitalization and Lasix was held temporarily, she had net negative of 4.2 L with a discharge weight of 242.1 pounds.  Lasix was restarted on 06/17/2019 2080 mg in the morning and 40 mg in the evening.    Metolazone was discontinued.  Lisinopril was changed to losartan 25 mg daily with plan to transition to [facility] when blood pressure and renal function improved.  It was thought that the reduced EF was related to atrial fibrillation with RVR in the setting of significant dyssynchrony.  She was planned for referral to EP as outpatient for consideration of BiV ICD if EF remained reduced.  HCTZ was discontinued.  She was to have follow-up BMET.   BMET on  06/19/2019, creatinine had improved slightly from 2.27-2.18.  She was to continue her current medication regimen.  She comes today bright and cheerful, talkative, stating that she is feeling well.  She is grateful for the care that she received in the hospital.  She weighs herself daily and avoid salt.  She has not gained any weight since returning home 1 week ago.  She is medically compliant.  She denies any chest pain, dyspnea, nausea, dizziness, or PND.  She is counting each milligram of salt that she ingests.  She is doing her best to take care of her self so that she will not have to have another admission.  She has no concerns today but is questioning why she needs to be seen by EP as she was not understanding the purpose.  She is very inquisitive about her health and wants to take an active role in making decisions.  Past Medical History:  Diagnosis Date  . Acute myocardial infarction of anterior wall (HCC) 10/20/2013   Afib, LBBB  . Acute pulmonary edema with congestive heart failure ([place]) 10/20/2013   Resolved  . Arthritis   . Atrial fibrillation ([place]) 10/20/2013   On Warfarin  . CAD, multiple vessel 10/20/2013   LM, LAD & RCA --> CABG; MYOVIEW 12/'15: Intermediate Risk would large anterior, anteroapical segment the apex possible infarct and peri-infarct ischemia  . CHF (congestive heart failure) (HCC)    EF 40-45%.  . Chronic kidney disease   . COPD (chronic obstructive pulmonary disease) ([place])   . Diabetic neuropathy ([place])   . Heart murmur  Likely related to aortic sclerosis  . Hyperlipidemia   . Hypertension   . Hypothyroidism   . Ischemic cardiomyopathy - Notable Improvement in EF post CABG 10/20/2013   Echo 2/23: EF 20-25%; mild LVH. anteroseptal Akinesis; mid-apical anterior, inferior & inferoseptal + apical-lateral akinesis; G 1 DDysfxn.; Mild-Mod LA dil; mild MR; Mod PHTN;; F/u Echo 12/2013: EF 40-45%, septal & apical HK, mild LVH; Mod LA dilation  . Left bundle branch block  (LBBB) on electrocardiogram   . Mild mitral regurgitation by prior echocardiogram    Mild-Mod MR on Echo  . Morbid obesity ([place])   . OSTEOARTHRITIS 08/06/2006  . S/P CABG x 3 10/23/2013   LIMA to LAD, SVG to OM2, SVG to RPLB, EVH via right thigh  . Type II diabetes mellitus with complication ([place]) 09/73/5329   off rx since heart attack 12/15-dr told could stay off if keep cbg under 150  . UTI (lower urinary tract infection) 09/29/14   ongoing now. started rx 09/28/14    Past Surgical History:  Procedure Laterality Date  . ABDOMINAL HYSTERECTOMY    . BREAST LUMPECTOMY W/ NEEDLE LOCALIZATION Right 10/06/2014   Dr Georgette Dover  . BREAST LUMPECTOMY WITH NEEDLE LOCALIZATION Right 10/06/2014   Procedure: RIGHT BREAST LUMPECTOMY WITH NEEDLE LOCALIZATION;  Surgeon: Donnie Mesa, MD;  Location: [place];  Service: General;  Laterality: Right;  . CLIPPING OF ATRIAL APPENDAGE N/A 10/23/2013   Procedure: CLIPPING OF ATRIAL APPENDAGE;  Surgeon: Rexene Alberts, MD;  Location: [place];  Service: Open Heart Surgery;  Laterality: N/A;  . 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  . CORONARY ARTERY BYPASS GRAFT N/A 10/23/2013   Procedure: CORONARY ARTERY BYPASS GRAFTING (CABG);  Surgeon: Rexene Alberts, MD;  Location: [place];  Service: Open Heart Surgery: LIMA-LAD, SVG-OM2, SVG-RPL  . INTRAOPERATIVE TRANSESOPHAGEAL ECHOCARDIOGRAM N/A 10/23/2013   Procedure: INTRAOPERATIVE TRANSESOPHAGEAL ECHOCARDIOGRAM;  Surgeon: Rexene Alberts, MD;  Location: [place];  Service: Open Heart Surgery;  Laterality: N/A;  . LEFT HEART CATHETERIZATION WITH CORONARY ANGIOGRAM N/A 10/20/2013   Procedure: LEFT HEART CATHETERIZATION WITH CORONARY ANGIOGRAM;  Surgeon: Leonie Man, MD;  Location: [facility] CATH LAB;  Service: Cardiovascular: Distal LM ~70%, LAD - ostial 70%, prox 80, 95 & 95%; RCA prox 70%, mid 80%; minimal Cx disease  . LEFT HEART CATHETERIZATION WITH CORONARY/GRAFT ANGIOGRAM N/A 09/08/2014   Procedure: LEFT HEART CATHETERIZATION WITH  Beatrix Fetters;  Surgeon: Leonie Man, MD;  Location: [facility] CATH LAB: Indication: Abnormal Myoview. Occluded LAD after 70 and 80% left main. Diffuse RCA disease. Widely patent grafts to moderately diseased artery vessels. Mild-moderate LV dysfunction and chronic A. fib.  [name] NM MYOVIEW LTD  08/14/2014   [place]i scan: INTERMEDIATE RISK - large, severe intensity perfusion defect in anterior wall, apex and inferior apex that is partly reversible. This suggests either scar or possible hibernating myocardium. Nondeviated.-> Likely scar. No change in coronary anatomy with patent grafts.  [name] TOTAL HIP ARTHROPLASTY  2010, 2011   right 2010, left 2011  . TRANSTHORACIC ECHOCARDIOGRAM  12/2013   Mildly dilated left ventricle with mild to moderately reduced function. EF 40-45% (notable improvement from February 2015). Septal and apical hypokinesis. Mild LA dilation.     Current Outpatient Medications  Medication Sig Dispense Refill  . acetaminophen (TYLENOL) 650 MG CR tablet Take 1,300 mg by mouth 2 (two) times daily.    [name] albuterol (PROAIR HFA) 108 (90 Base) MCG/ACT inhaler Inhale 2 puffs into the lungs every 6 (six) hours  as needed for wheezing or shortness of breath.    [name] apixaban (ELIQUIS) 5 MG TABS tablet Take 5 mg by mouth 2 (two) times daily.    [name] aspirin EC 81 MG tablet Take 1 tablet (81 mg total) by mouth daily.    . AZO-CRANBERRY PO Take 1 tablet by mouth daily.    . carboxymethylcellulose (REFRESH PLUS) 0.5 % SOLN Place 1 drop into the left eye daily.    . Coenzyme Q10 (COQ10 PO) Take 300 mg by mouth See admin instructions. Take one capsule (300 mg) by mouth on Sunday, Tuesday, Thursday, Saturday night    . Dulaglutide (TRULICITY) 6.38 VF/6.4PP SOPN Inject 0.75 mg into the skin every Thursday.     . ezetimibe (ZETIA) 10 MG tablet Take 10 mg by mouth daily.    . febuxostat (ULORIC) 40 MG tablet Take 40 mg by mouth daily.    . ferrous sulfate 325 (65 FE) MG tablet TAKE ONE TABLET BY MOUTH  ONCE DAILY WITH  BREAKFAST (Patient taking differently: Take 325 mg by mouth daily with breakfast. ) 30 tablet 4  . furosemide (LASIX) 40 MG tablet Take 1 tablet (40 mg total) by mouth daily. Take 2 tablets (80mg  total) in the morning and 1 tablet (40 mg total) in the evening. 90 tablet 2  . ipratropium-albuterol (DUONEB) 0.5-2.5 (3) MG/3ML SOLN Take 3 mLs by nebulization every 4 (four) hours as needed (for shortness of breath).     [name] levothyroxine (SYNTHROID) 112 MCG tablet Take 1 tablet (112 mcg total) by mouth daily before breakfast. 30 tablet 12  . losartan (COZAAR) 25 MG tablet Take 1 tablet (25 mg total) by mouth daily. 30 tablet 2  . metoprolol succinate (TOPROL-XL) 100 MG 24 hr tablet Take 1 tablet (100 mg total) by mouth daily. Take with or immediately following a meal. 30 tablet 2  . Multiple Vitamin (MULTIVITAMIN WITH MINERALS) TABS tablet Take 1 tablet by mouth daily.    . Multiple Vitamins-Minerals (PRESERVISION AREDS 2) CAPS Take 1 capsule by mouth 2 (two) times daily.    . prednisoLONE acetate (PRED FORTE) 1 % ophthalmic suspension Place 1 drop into the right eye daily.    . rosuvastatin (CRESTOR) 5 MG tablet Take 5 mg by mouth See admin instructions. Take one tablet (5 mg) by mouth at night on Sunday, Tuesday, Thursday, Saturday    . sodium chloride (MURO 128) 2 % ophthalmic solution Place 1 drop into the right eye daily.    [name] umeclidinium-vilanterol (ANORO ELLIPTA) 62.5-25 MCG/INH AEPB Inhale 1 puff into the lungs daily.    . vitamin C (ASCORBIC ACID) 500 MG tablet Take 2,000 mg by mouth daily.     No current facility-administered medications for this visit.     Allergies:   Allopurinol and Tape    Social History:  The patient  reports that she quit smoking about 25 years ago. Her smoking use included cigarettes. She has a 19.50 pack-year smoking history. She has never used smokeless tobacco. She reports that she does not drink alcohol or use drugs.   Family History:  The  patient's family history is not on file.    ROS: All other systems are reviewed and negative. Unless otherwise mentioned in H&P    PHYSICAL EXAM: VS:  BP 122/64   Pulse 78   Temp 97.9 F (36.6 C)   Ht 5' 2.5" (1.588 m)   Wt 233 lb 3.2 oz (105.8 kg)   SpO2 100%   BMI  41.97 kg/m  , BMI Body mass index is 41.97 kg/m. GEN: Well nourished, well developed, in no acute distress HEENT: normal Neck: no JVD, carotid bruits, or masses Cardiac: IRRR, distant; no murmurs, rubs, or gallops, 1+ dependent edema  Respiratory:  Clear to auscultation bilaterally, normal work of breathing GI: soft, nontender, nondistended, + BS MS: no deformity or atrophy, leg brace to right lower extremity Skin: warm and dry, no rash Neuro:  Strength and sensation are intact Psych: euthymic mood, full affect   EKG: Not completed this office visit   Recent Labs: 06/12/2019: ALT 20; B Natriuretic Peptide 919.9; Hemoglobin 10.5; Platelets 180 06/19/2019: BUN 56; Creatinine, Ser 2.18; Potassium 4.8; Sodium 139    Lipid Panel    Component Value Date/Time   CHOL 131 07-01-2019 1940   TRIG 71 July 01, 2019 1940   HDL 57 07-01-2019 1940   CHOLHDL 2.3 07/01/2019 1940   VLDL 14 01-Jul-2019 1940   LDLCALC 60 2019-07-01 1940      Wt Readings from Last 3 Encounters:  06/25/19 233 lb 3.2 oz (105.8 kg)  06/16/19 242 lb 1.6 oz (109.8 kg)  06/11/19 253 lb 11.2 oz (115.1 kg)      Other studies Reviewed: Echocardiogram 07-01-19: Impressions: 1. Left ventricular ejection fraction, by visual estimation, is <20%. The left ventricle has severely decreased function. There is mildly increased left ventricular hypertrophy. 2. Left ventricular diastolic function could not be evaluated pattern of LV diastolic filling. 3. Global right ventricle has normal systolic function.The right ventricular size is normal. No increase in right ventricular wall thickness. 4. Left atrial size was normal. 5. Right atrial size was  normal. 6. Moderate calcification of the mitral valve leaflet(s). 7. Moderate thickening of the mitral valve leaflet(s). 8. The mitral valve is abnormal. Trace mitral valve regurgitation. 9. The tricuspid valve is normal in structure. Tricuspid valve regurgitation is mild. 10. The aortic valve is tricuspid Aortic valve regurgitation is trivial by color flow Doppler. Mild to moderate aortic valve sclerosis/calcification without any evidence of aortic stenosis. 11. The pulmonic valve was normal in structure. Pulmonic valve regurgitation is trivial by color flow Doppler. 12. Moderately elevated pulmonary artery systolic pressure. 13. The inferior vena cava is normal in size with greater than 50% respiratory variability, suggesting right atrial pressure of 3 mmHg. 14. The atrial septum is grossly normal.  ASSESSMENT AND PLAN:  1.  Ischemic cardiomyopathy: Most recent echocardiogram during hospitalization dated 07/01/19 revealed an ejection fraction less than 20% with severely decreased LV function.  The patient remains on diuretics, Lasix 80 mg in the morning and 40 mg in the evening.  She is restricting her salt intake, medically compliant, I do not feel as if she is a candidate for Entresto due to kidney function.  Most recent creatinine was 2.24.  I am going to repeat her BMET in 1 months.  I am referring her to electrophysiology for discussion of need for ICD pacemaker for VT prophylaxis.  I have explained to her reasoning behind this and will allow her an electrophysiologist to discuss this further and make an informed decision with shared decision making with herself and her family.  I am also referring her to the advanced heart failure clinic due to significantly reduced ejection fraction for optimal medical management.  I would not feel comfortable adding Entresto or spironolactone to her medication regimen as her kidney function is reduced.  However I will leave her on Lasix and losartan  for now along with metoprolol for heart  rate control.  2.  Atrial fibrillation: Heart rate is currently well controlled.  She is undergone left atrial appendage, clipping in 2015.  She denies any rapid heart rhythms, skipping, or bleeding on Eliquis.  We will continue with kidney function evaluation and need to reduce Eliquis dose if medically necessary.  She offers no complaints of bleeding or hemoptysis.  Will check CBC in 1 month with BMET.  3.  Coronary artery disease: History of coronary artery bypass grafting, 2015 with LIMA to LAD, SVG to OM 2, and SVG to RPL.  She will continue on secondary prevention with beta-blocker, and statin therapy.  She is not on aspirin in the setting of anticoagulation therapy.  4.  Hypercholesterolemia: Continue rosuvastatin as directed, Sundays Tuesdays Thursdays and Saturdays only.   Current medicines are reviewed at length with the patient today.    Labs/ tests ordered today include: BMET, CBC Phill Myron. [place] Pugh, ANP, [facility]   06/25/2019 8:48 AM     [facility] [address] Office [phone] Fax [phone]  Notice: This dictation was prepared with Dragon dictation along with smaller phrase technology. Any transcriptional errors that result from this process are unintentional and may not be corrected upon review.

## 2019-06-25 ENCOUNTER — Ambulatory Visit (INDEPENDENT_AMBULATORY_CARE_PROVIDER_SITE_OTHER): Payer: Medicare Other | Admitting: Adult Health

## 2019-06-25 ENCOUNTER — Other Ambulatory Visit: Payer: Self-pay

## 2019-06-25 ENCOUNTER — Encounter: Payer: Self-pay | Admitting: Adult Health

## 2019-06-25 VITALS — BP 122/64 | HR 78 | Temp 97.9°F | Ht 62.5 in | Wt 233.2 lb

## 2019-06-25 DIAGNOSIS — R0989 Other specified symptoms and signs involving the circulatory and respiratory systems: Secondary | ICD-10-CM

## 2019-06-25 DIAGNOSIS — Z79899 Other long term (current) drug therapy: Secondary | ICD-10-CM

## 2019-06-25 DIAGNOSIS — I255 Ischemic cardiomyopathy: Secondary | ICD-10-CM | POA: Diagnosis not present

## 2019-06-25 DIAGNOSIS — I5043 Acute on chronic combined systolic (congestive) and diastolic (congestive) heart failure: Secondary | ICD-10-CM

## 2019-06-25 DIAGNOSIS — E7800 Pure hypercholesterolemia, unspecified: Secondary | ICD-10-CM

## 2019-06-25 DIAGNOSIS — R931 Abnormal findings on diagnostic imaging of heart and coronary circulation: Secondary | ICD-10-CM

## 2019-06-25 DIAGNOSIS — I2510 Atherosclerotic heart disease of native coronary artery without angina pectoris: Secondary | ICD-10-CM

## 2019-06-25 NOTE — Patient Instructions (Addendum)
Medication Instructions:  Continue current medications  *If you need a refill on your cardiac medications before your next appointment, please call your pharmacy*  Lab Work: CBC and BMP in 1 Month  If you have labs (blood work) drawn today and your tests are completely normal, you will receive your results only by: [name] MyChart Message (if you have MyChart) OR . A paper copy in the mail If you have any lab test that is abnormal or we need to change your treatment, we will call you to review the results.  Testing/Procedures: None Ordered  Follow-Up: At [facility], you and your health needs are our priority.  As part of our continuing mission to provide you with exceptional heart care, we have created designated Provider Care Teams.  These Care Teams include your primary Cardiologist (physician) and Advanced Practice Providers (APPs -  Physician Assistants and Nurse Practitioners) who all work together to provide you with the care you need, when you need it.  Your next appointment:   3 months  The format for your next appointment:   In Person  Provider:   Glenetta Hew, MD  Other: You have been referred to Advance Heart Failure You have been referred to Cardiac Electrophysiology

## 2019-07-04 ENCOUNTER — Other Ambulatory Visit: Payer: Self-pay | Admitting: Internal Medicine

## 2019-07-04 DIAGNOSIS — Z1231 Encounter for screening mammogram for malignant neoplasm of breast: Secondary | ICD-10-CM

## 2019-07-07 ENCOUNTER — Telehealth: Payer: Medicare Other | Admitting: Cardiology

## 2019-07-09 ENCOUNTER — Telehealth: Payer: Self-pay | Admitting: Cardiology

## 2019-07-09 LAB — CBC
Hematocrit: 37.1 % (ref 34.0–46.6)
Hemoglobin: 11.9 g/dL (ref 11.1–15.9)
MCH: 28.5 pg (ref 26.6–33.0)
MCHC: 32.1 g/dL (ref 31.5–35.7)
MCV: 89 fL (ref 79–97)
Platelets: 231 10*3/uL (ref 150–450)
RBC: 4.18 x10E6/uL (ref 3.77–5.28)
RDW: 15 % (ref 11.7–15.4)
WBC: 6.6 10*3/uL (ref 3.4–10.8)

## 2019-07-09 LAB — BASIC METABOLIC PANEL
BUN/Creatinine Ratio: 30 — ABNORMAL HIGH (ref 12–28)
BUN: 61 mg/dL — ABNORMAL HIGH (ref 8–27)
CO2: 23 mmol/L (ref 20–29)
Calcium: 10.3 mg/dL (ref 8.7–10.3)
Chloride: 99 mmol/L (ref 96–106)
Creatinine, Ser: 2.02 mg/dL — ABNORMAL HIGH (ref 0.57–1.00)
GFR calc Af Amer: 27 mL/min/{1.73_m2} — ABNORMAL LOW (ref >59)
GFR calc non Af Amer: 23 mL/min/{1.73_m2} — ABNORMAL LOW (ref >59)
Glucose: 145 mg/dL — ABNORMAL HIGH (ref 65–99)
Potassium: 5.1 mmol/L (ref 3.5–5.2)
Sodium: 140 mmol/L (ref 134–144)

## 2019-07-09 NOTE — Telephone Encounter (Signed)
Pt informed ok for her grandson to accompany her to appt. Also informed that it is ok to have her son on facetime for appt (he is a marine and lives in [place], [place]). Pt appreciates my return call

## 2019-07-09 NOTE — Telephone Encounter (Signed)
New Message     Pts grandson will be bringing her to her appt because she uses a walker and wheel chair  She also says she would like to do a video call during her appt with her son and wants to make sure that would be okay    Please call

## 2019-07-14 ENCOUNTER — Encounter (INDEPENDENT_AMBULATORY_CARE_PROVIDER_SITE_OTHER): Payer: Self-pay

## 2019-07-14 ENCOUNTER — Encounter: Payer: Self-pay | Admitting: Cardiology

## 2019-07-14 ENCOUNTER — Ambulatory Visit (INDEPENDENT_AMBULATORY_CARE_PROVIDER_SITE_OTHER): Payer: Medicare Other | Admitting: Cardiology

## 2019-07-14 ENCOUNTER — Other Ambulatory Visit: Payer: Self-pay

## 2019-07-14 VITALS — BP 120/60 | HR 101 | Ht 62.5 in | Wt 239.6 lb

## 2019-07-14 DIAGNOSIS — I255 Ischemic cardiomyopathy: Secondary | ICD-10-CM | POA: Diagnosis not present

## 2019-07-14 DIAGNOSIS — Z01812 Encounter for preprocedural laboratory examination: Secondary | ICD-10-CM

## 2019-07-14 NOTE — Patient Instructions (Signed)
Medication Instructions:  Your physician recommends that you continue on your current medications as directed. Please refer to the Current Medication list given to you today.     * If you need a refill on your cardiac medications before your next appointment, please call your pharmacy. *   Labwork: Pre procedure lab work today: BMET If you have labs (blood work) drawn today and your tests are completely normal, you will receive your results only by:  [employer] (if you have MyChart) OR  A paper copy in the mail If you have any lab test that is abnormal or we need to change your treatment, we will call you to review the results.   Testing/Procedures: Your physician has recommended that you have a defibrillator inserted. An implantable cardioverter defibrillator (ICD) is a small device that is placed in your chest or, in rare cases, your abdomen. This device uses electrical pulses or shocks to help control life-threatening, irregular heartbeats that could lead the heart to suddenly stop beating (sudden cardiac arrest). Leads are attached to the ICD that goes into your heart. This is done in the hospital and usually requires an overnight stay. Please follow the instructions below, located under the special instructions section.  Follow-Up: Your physician recommends that you schedule a wound check appointment 10-14 days, after your procedure on 07/28/19, with the device clinic.  Your physician recommends that you schedule a follow up appointment in 91 days, after your procedure on 07/28/19, with Dr. Curt Bears.  Thank you for choosing [facility]!!   Trinidad Curet, RN [phone]   Any Other Special Instructions Will Be Listed Below (If Applicable).    Implantable Device Instructions  You are scheduled for: Defibrillator implant on 07/28/2019 with Dr. Curt Bears.  1.   Pre procedure testing-             A.  LAB WORK--- On 07/14/19               B. COVID TEST-- On 07/25/19 @  11:00 am - You will go to [facility] ([employer]) for your Covid testing.   This is a drive thru test site.  There will be multiple testing areas.  Be sure to share with the first checkpoint that you are there for pre-procedure/surgery testing. This will put you into the right (yellow) lane that leads to the PAT testing team.   Stay in your car and the nurse team will come to your car to test you.  After you are tested please go home and self quarantine until the day of your procedure.    2. On the day of your procedure 07/28/19 you will go to [facility] [phone]) at 5:30 am.  Dennis Bast will go to the main entrance [address] [employer]) and enter where the [employer] are.  You will check in at ADMITTING.  You may have one support person come in to the hospital with you.  They will be asked to wait in the waiting room.   3.   Do not eat or drink after midnight prior to your procedure.   4.   On the morning of your procedure do NOT take any medication.  5.  The night before your procedure and the morning of your procedure scrub your neck/chest with surgical scrub.  An instruction letter is included with this letter.   5.  Plan for an overnight stay.  If you use your phone frequently  bring your Pensions consultant.  When you are discharged you will need someone to drive you home.   6.  You will follow up with the [place] clinic 10-14 days after your procedure. You will follow up with Dr. Curt Bears 91 days after your procedure.  These appointments will be made for you.   * If you have ANY questions after you get home, please call the office [phone] and ask for Alastair Hennes RN or send a MyChart message.     [place] - Preparing For Surgery  Before surgery, you can play an important role. Because skin is not sterile, your skin needs to be as free of germs as possible. You can reduce the number of germs on your skin by washing with CHG (chlorahexidine  gluconate) Soap before surgery.  CHG is an antiseptic cleaner which kills germs and bonds with the skin to continue killing germs even after washing.   Please do not use if you have an allergy to CHG or antibacterial soaps.  If your skin becomes reddened/irritated stop using the CHG.   Do not shave (including legs and underarms) for at least 48 hours prior to first CHG shower.  It is OK to shave your face.  Please follow these instructions carefully:  1.  Shower the night before surgery and the morning of surgery with CHG.  2.  If you choose to wash your hair, wash your hair first as usual with your normal shampoo.  3.  After you shampoo, rinse your hair and body thoroughly to remove the shampoo.  4.  Use CHG as you would any other liquid soap.  You can apply CHG directly to the skin and wash gently with a clean washcloth. 5.  Apply the CHG Soap to your body ONLY FROM THE NECK DOWN.  Do not use on open wounds or open sores.  Avoid contact with your eyes, ears, mouth and genitals (private parts).  Wash genitals (private parts) with your normal soap.  6.  Wash thoroughly, paying special attention to the area where your surgery will be performed.  7.  Thoroughly rinse your body with warm water from the neck down.   8.  DO NOT shower/wash with your normal soap after using and rinsing off the CHG soap.  9.  Pat yourself dry with a clean towel.           10.  Wear clean pajamas.           11.  Place clean sheets on your bed the night of your first shower and do not sleep with pets.  Day of Surgery: Do not apply any deodorants/lotions.  Please wear clean clothes to the hospital/surgery center.     Cardioverter Defibrillator Implantation An implantable cardioverter defibrillator (ICD) is a small, lightweight, battery-powered device that is placed (implanted) under the skin in the chest or abdomen. Your caregiver may prescribe an ICD if:  You have had an irregular heart rhythm (arrhythmia) that  originated in the lower chambers of the heart (ventricles).  Your heart has been damaged by a disease (such as coronary artery disease) or heart condition (such as a heart attack). An ICD consists of a battery that lasts several years, a small computer called a pulse generator, and wires called leads that go into the heart. It is used to detect and correct two dangerous arrhythmias: a rapid heart rhythm (tachycardia) and an arrhythmia in which the ventricles contract in an uncoordinated way (fibrillation). When  an ICD detects tachycardia, it sends an electrical signal to the heart that restores the heartbeat to normal (cardioversion). This signal is usually painless. If cardioversion does not work or if the ICD detects fibrillation, it delivers a small electrical shock to the heart (defibrillation) to restart the heart. The shock may feel like a strong jolt in the chest. ICDs may be programmed to correct other problems. Sometimes, ICDs are programmed to act as another type of implantable device called a pacemaker. Pacemakers are used to treat a slow heartbeat (bradycardia). LET YOUR CAREGIVER KNOW ABOUT:  Any allergies you have.  All medicines you are taking, including vitamins, herbs, eyedrops, and over-the-counter medicines and creams.  Previous problems you or members of your family have had with the use of anesthetics.  Any blood disorders you have had.  Other health problems you have. RISKS AND COMPLICATIONS Generally, the procedure to implant an ICD is safe. However, as with any surgical procedure, complications can occur. Possible complications associated with implanting an ICD include:  Swelling, bleeding, or bruising at the site where the ICD was implanted.  Infection at the site where the ICD was implanted.  A reaction to medicine used during the procedure.  Nerve, heart, or blood vessel damage.  Blood clots. BEFORE THE PROCEDURE  You may need to have blood tests, heart tests,  or a chest X-ray done before the day of the procedure.  Ask your caregiver about changing or stopping your regular medicines.  Make plans to have someone drive you home. You may need to stay in the hospital overnight after the procedure.  Stop smoking at least 24 hours before the procedure.  Take a bath or shower the night before the procedure. You may need to scrub your chest or abdomen with a special type of soap.  Do not eat or drink before your procedure for as long as directed by your caregiver. Ask if it is okay to take any needed medicine with a small sip of water. PROCEDURE  The procedure to implant an ICD in your chest or abdomen is usually done at a hospital in a room that has a large X-ray machine called a fluoroscope. The machine will be above you during the procedure. It will help your caregiver see your heart during the procedure. Implanting an ICD usually takes 1-3 hours. Before the procedure:   Small monitors will be put on your body. They will be used to check your heart, blood pressure, and oxygen level.  A needle will be put into a vein in your hand or arm. This is called an intravenous (IV) access tube. Fluids and medicine will flow directly into your body through the IV tube.  Your chest or abdomen will be cleaned with a germ-killing (antiseptic) solution. The area may be shaved.  You may be given medicine to help you relax (sedative).  You will be given a medicine called a local anesthetic. This medicine will make the surgical site numb while the ICD is implanted. You will be sleepy but awake during the procedure. After you are numb the procedure will begin. The caregiver will:  Make a small cut (incision). This will make a pocket deep under your skin that will hold the pulse generator.  Guide the leads through a large blood vessel into your heart and attach them to the heart muscles. Depending on the ICD, the leads may go into one ventricle or they may go to both  ventricles and into an upper chamber of  the heart (atrium).  Test the ICD.  Close the incision with stitches, glue, or staples. AFTER THE PROCEDURE  You may feel pain. Some pain is normal. It may last a few days.  You may stay in a recovery area until the local anesthetic has worn off. Your blood pressure and pulse will be checked often. You will be taken to a room where your heart will be monitored.  A chest X-ray will be taken. This is done to check that the cardioverter defibrillator is in the right place.  You may stay in the hospital overnight.  A slight bump may be seen over the skin where the ICD was placed. Sometimes, it is possible to feel the ICD under the skin. This is normal.  In the months and years afterward, your caregiver will check the device, the leads, and the battery every few months. Eventually, when the battery is low, the ICD will be replaced.   This information is not intended to replace advice given to you by your health care provider. Make sure you discuss any questions you have with your health care provider.   Document Released: 05/06/2002 Document Revised: 06/04/2013 Document Reviewed: 09/02/2012 Elsevier Interactive Patient Education 2016 [place] Defibrillator Implantation, Care After This sheet gives you information about how to care for yourself after your procedure. Your health care provider may also give you more specific instructions. If you have problems or questions, contact your health care provider. What can I expect after the procedure? After the procedure, it is common to have:  Some pain. It may last a few days.  A slight bump over the skin where the device was placed. Sometimes, it is possible to feel the device under the skin. This is normal.  During the months and years after your procedure, your health care provider will check the device, the leads, and the battery every few months. Eventually, when the battery is  low, the device will be replaced. Follow these instructions at home: Medicines  Take over-the-counter and prescription medicines only as told by your health care provider.  If you were prescribed an antibiotic medicine, take it as told by your health care provider. Do not stop taking the antibiotic even if you start to feel better. Incision care   Follow instructions from your health care provider about how to take care of your incision area. Make sure you: ? Wash your hands with soap and water before you change your bandage (dressing). If soap and water are not available, use hand sanitizer. ? Change your dressing as told by your health care provider. ? Leave stitches (sutures), skin glue, or adhesive strips in place. These skin closures may need to stay in place for 2 weeks or longer. If adhesive strip edges start to loosen and curl up, you may trim the loose edges. Do not remove adhesive strips completely unless your health care provider tells you to do that.  Check your incision area every day for signs of infection. Check for: ? More redness, swelling, or pain. ? More fluid or blood. ? Warmth. ? Pus or a bad smell.  Do not use lotions or ointments near the incision area unless told by your health care provider.  Keep the incision area clean and dry for 2-3 days after the procedure or for as long as told by your health care provider. It takes several weeks for the incision site to heal completely.  Do not take baths, swim, or use  a hot tub until your health care provider approves. Activity  Try to walk a little every day. Exercising is important after this procedure. Also, use your shoulder on the side of the defibrillator in daily tasks that do not require a lot of motion.  For at least 6 weeks: ? Do not lift your upper arm above your shoulders. This means no tennis, golf, or swimming for this period of time. If you tend to sleep with your arm above your head, use a restraint to  prevent this during sleep. ? Avoid sudden jerking, pulling, or chopping movements that pull your upper arm far away from your body.  Ask your health care provider when you may go back to work.  Check with your health care provider before you start to drive or play sports. Electric and magnetic fields  Tell all health care providers that you have a defibrillator. This may prevent them from giving you an MRI scan because strong magnets are used for that test.  If you must pass through a metal detector, quickly walk through it. Do not stop under the detector, and do not stand near it.  Avoid places or objects that have a strong electric or magnetic field, including: ? Airport Herbalist. At the airport, let officials know that you have a defibrillator. Your defibrillator ID card will let you be checked in a way that is safe for you and will not damage your defibrillator. Also, do not let a security person wave a magnetic wand near your defibrillator. That can make it stop working. ? Power plants. ? Large electrical generators. ? Anti-theft systems or electronic article surveillance (EAS). ? Radiofrequency transmission towers, such as cell phone and radio towers.  Do not use amateur (ham) radio equipment or electric (arc) welding torches. Some devices are safe to use if held at least 12 inches (30 cm) from your defibrillator. These include power tools, lawn mowers, and speakers. If you are unsure if something is safe to use, ask your health care provider.  Do not use MP3 player headphones. They have magnets.  You may safely use electric blankets, heating pads, computers, and microwave ovens.  When using your cell phone, hold it to the ear that is on the opposite side from the defibrillator. Do not leave your cell phone in a pocket over the defibrillator. General instructions  Follow diet instructions from your health care provider, if this applies.  Always keep your defibrillator ID  card with you. The card should list the implant date, device model, and manufacturer. Consider wearing a medical alert bracelet or necklace.  Have your defibrillator checked every 3-6 months or as often as told by your health care provider. Most defibrillators last for 4-8 years.  Keep all follow-up visits as told by your health care provider. This is important for your health care provider to make sure your chest is healing the way it should. Ask your health care provider when you should come back to have your stitches or staples taken out. Contact a health care provider if:  You feel one shock in your chest.  You gain weight suddenly.  Your legs or feet swell more than they have before.  It feels like your heart is fluttering or skipping beats (heart palpitations).  You have more redness, swelling, or pain around your incision.  You have more fluid or blood coming from your incision.  Your incision feels warm to the touch.  You have pus or a bad smell  coming from your incision.  You have a fever. Get help right away if:  You have chest pain.  You feel more than one shock.  You feel more short of breath than you have felt before.  You feel more light-headed than you have felt before.  Your incision starts to open up. This information is not intended to replace advice given to you by your health care provider. Make sure you discuss any questions you have with your health care provider. Document Released: 03/03/2005 Document Revised: 03/03/2016 Document Reviewed: 01/19/2016 Elsevier Interactive Patient Education  2018 [place] Discharge Instructions for  Pacemaker/Defibrillator Patients  ACTIVITY No heavy lifting or vigorous activity with your left/right arm for 6 to 8 weeks.  Do not raise your left/right arm above your head for one week.  Gradually raise your affected arm as drawn below.           __  NO DRIVING for     ; you may begin  driving on     .  WOUND CARE - Keep the wound area clean and dry.  Do not get this area wet for one week. No showers for one week; you may shower on     . - The tape/steri-strips on your wound will fall off; do not pull them off.  No bandage is needed on the site.  DO  NOT apply any creams, oils, or ointments to the wound area. - If you notice any drainage or discharge from the wound, any swelling or bruising at the site, or you develop a fever > 101? F after you are discharged home, call the office at once.  SPECIAL INSTRUCTIONS - You are still able to use cellular telephones; use the ear opposite the side where you have your pacemaker/defibrillator.  Avoid carrying your cellular phone near your device. - When traveling through airports, show security personnel your identification card to avoid being screened in the metal detectors.  Ask the security personnel to use the hand wand. - Avoid arc welding equipment, MRI testing (magnetic resonance imaging), TENS units (transcutaneous nerve stimulators).  Call the office for questions about other devices. - Avoid electrical appliances that are in poor condition or are not properly grounded. - Microwave ovens are safe to be near or to operate.  ADDITIONAL INFORMATION FOR DEFIBRILLATOR PATIENTS SHOULD YOUR DEVICE GO OFF: - If your device goes off ONCE and you feel fine afterward, notify the device clinic nurses. - If your device goes off ONCE and you do not feel well afterward, call 911. - If your device goes off TWICE, call 911. - If your device goes off [place], call 911.  DO NOT DRIVE YOURSELF OR A FAMILY MEMBER WITH A DEFIBRILLATOR TO THE HOSPITAL--CALL 911.

## 2019-07-14 NOTE — Progress Notes (Signed)
Electrophysiology Office Note   Date:  07/14/2019   ID:  Elaine [name], DOB 1941/01/03, MRN [id number]  PCP:  Marton Redwood, MD  Cardiologist:  Ellyn Hack Primary Electrophysiologist:  Catelyn Friel Meredith Leeds, MD    Chief Complaint: CHF   History of Present Illness: Elaine [name] is a 78 y.o. female who is being seen today for the evaluation of CHF at the request of Lendon Colonel, NP. Presenting today for electrophysiology evaluation.  She has a history of CHF status post CABG with left atrial appendage clipping, ischemic cardiomyopathy, persistent atrial fibrillation, hypertension, hyperlipidemia, type 2 diabetes, hypothyroidism, and stage III CKD.  She was hospitalized 06/12/2019 with decompensated heart failure.  Echo showed an ejection fraction less than 20%.  She was also in atrial fibrillation with RVR.  It was initially thought that her heart failure is due to her rapid atrial fibrillation and her left bundle branch block.  Today, she denies symptoms of palpitations, chest pain, shortness of breath, orthopnea, PND, lower extremity edema, claudication, dizziness, presyncope, syncope, bleeding, or neurologic sequela. The patient is tolerating medications without difficulties.  She has gained a few pounds over the last few days.  She is started to take extra doses of Lasix.   Past Medical History:  Diagnosis Date  . Acute myocardial infarction of anterior wall (HCC) 10/20/2013   Afib, LBBB  . Acute pulmonary edema with congestive heart failure ([place]) 10/20/2013   Resolved  . Arthritis   . Atrial fibrillation ([place]) 10/20/2013   On Warfarin  . CAD, multiple vessel 10/20/2013   LM, LAD & RCA --> CABG; MYOVIEW 12/'15: Intermediate Risk would large anterior, anteroapical segment the apex possible infarct and peri-infarct ischemia  . CHF (congestive heart failure) (HCC)    EF 40-45%.  . Chronic kidney disease   . COPD (chronic obstructive pulmonary disease) ([place])   . Diabetic  neuropathy ([place])   . Heart murmur    Likely related to aortic sclerosis  . Hyperlipidemia   . Hypertension   . Hypothyroidism   . Ischemic cardiomyopathy - Notable Improvement in EF post CABG 10/20/2013   Echo 2/23: EF 20-25%; mild LVH. anteroseptal Akinesis; mid-apical anterior, inferior & inferoseptal + apical-lateral akinesis; G 1 DDysfxn.; Mild-Mod LA dil; mild MR; Mod PHTN;; F/u Echo 12/2013: EF 40-45%, septal & apical HK, mild LVH; Mod LA dilation  . Left bundle branch block (LBBB) on electrocardiogram   . Mild mitral regurgitation by prior echocardiogram    Mild-Mod MR on Echo  . Morbid obesity ([place])   . OSTEOARTHRITIS 08/06/2006  . S/P CABG x 3 10/23/2013   LIMA to LAD, SVG to OM2, SVG to RPLB, EVH via right thigh  . Type II diabetes mellitus with complication ([place]) 16/96/7893   off rx since heart attack 12/15-dr told could stay off if keep cbg under 150  . UTI (lower urinary tract infection) 09/29/14   ongoing now. started rx 09/28/14   Past Surgical History:  Procedure Laterality Date  . ABDOMINAL HYSTERECTOMY    . BREAST LUMPECTOMY W/ NEEDLE LOCALIZATION Right 10/06/2014   Dr Georgette Dover  . BREAST LUMPECTOMY WITH NEEDLE LOCALIZATION Right 10/06/2014   Procedure: RIGHT BREAST LUMPECTOMY WITH NEEDLE LOCALIZATION;  Surgeon: Donnie Mesa, MD;  Location: [facility];  Service: General;  Laterality: Right;  . CLIPPING OF ATRIAL APPENDAGE N/A 10/23/2013   Procedure: CLIPPING OF ATRIAL APPENDAGE;  Surgeon: Rexene Alberts, MD;  Location: [facility];  Service: Open Heart Surgery;  Laterality: N/A;  .  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  . CORONARY ARTERY BYPASS GRAFT N/A 10/23/2013   Procedure: CORONARY ARTERY BYPASS GRAFTING (CABG);  Surgeon: Rexene Alberts, MD;  Location: [facility];  Service: Open Heart Surgery: LIMA-LAD, SVG-OM2, SVG-RPL  . INTRAOPERATIVE TRANSESOPHAGEAL ECHOCARDIOGRAM N/A 10/23/2013   Procedure: INTRAOPERATIVE TRANSESOPHAGEAL ECHOCARDIOGRAM;  Surgeon: Rexene Alberts, MD;  Location: [facility];   Service: Open Heart Surgery;  Laterality: N/A;  . LEFT HEART CATHETERIZATION WITH CORONARY ANGIOGRAM N/A 10/20/2013   Procedure: LEFT HEART CATHETERIZATION WITH CORONARY ANGIOGRAM;  Surgeon: Leonie Man, MD;  Location: [facility] CATH LAB;  Service: Cardiovascular: Distal LM ~70%, LAD - ostial 70%, prox 80, 95 & 95%; RCA prox 70%, mid 80%; minimal Cx disease  . LEFT HEART CATHETERIZATION WITH CORONARY/GRAFT ANGIOGRAM N/A 09/08/2014   Procedure: LEFT HEART CATHETERIZATION WITH Beatrix Fetters;  Surgeon: Leonie Man, MD;  Location[facility] CATH LAB: Indication: Abnormal Myoview. Occluded LAD after 70 and 80% left main. Diffuse RCA disease. Widely patent grafts to moderately diseased artery vessels. Mild-moderate LV dysfunction and chronic A. fib.  [name] NM MYOVIEW LTD  08/14/2014   Lexi scan: INTERMEDIATE RISK - large, severe intensity perfusion defect in anterior wall, apex and inferior apex that is partly reversible. This suggests either scar or possible hibernating myocardium. Nondeviated.-> Likely scar. No change in coronary anatomy with patent grafts.  [name] TOTAL HIP ARTHROPLASTY  2010, 2011   right 2010, left 2011  . TRANSTHORACIC ECHOCARDIOGRAM  12/2013   Mildly dilated left ventricle with mild to moderately reduced function. EF 40-45% (notable improvement from February 2015). Septal and apical hypokinesis. Mild LA dilation.     Current Outpatient Medications  Medication Sig Dispense Refill  . acetaminophen (TYLENOL) 650 MG CR tablet Take 1,300 mg by mouth 2 (two) times daily.    [name] albuterol (PROAIR HFA) 108 (90 Base) MCG/ACT inhaler Inhale 2 puffs into the lungs every 6 (six) hours as needed for wheezing or shortness of breath.    [name] apixaban (ELIQUIS) 5 MG TABS tablet Take 5 mg by mouth 2 (two) times daily.    [name] aspirin EC 81 MG tablet Take 1 tablet (81 mg total) by mouth daily.    . AZO-CRANBERRY PO Take 1 tablet by mouth daily.    . carboxymethylcellulose (REFRESH PLUS) 0.5 % SOLN Place 1  drop into the left eye daily.    . Coenzyme Q10 (COQ10 PO) Take 300 mg by mouth See admin instructions. Take one capsule (300 mg) by mouth on Sunday, Tuesday, Thursday, Saturday night    . Dulaglutide (TRULICITY) 7.07 EM/7.5QG SOPN Inject 0.75 mg into the skin every Thursday.     . ezetimibe (ZETIA) 10 MG tablet Take 10 mg by mouth daily.    . febuxostat (ULORIC) 40 MG tablet Take 40 mg by mouth daily.    . ferrous sulfate 325 (65 FE) MG tablet TAKE ONE TABLET BY MOUTH ONCE DAILY WITH  BREAKFAST 30 tablet 4  . furosemide (LASIX) 40 MG tablet Take 1 tablet (40 mg total) by mouth daily. Take 2 tablets (80mg  total) in the morning and 1 tablet (40 mg total) in the evening. 90 tablet 2  . ipratropium-albuterol (DUONEB) 0.5-2.5 (3) MG/3ML SOLN Take 3 mLs by nebulization every 4 (four) hours as needed (for shortness of breath).     [name] levothyroxine (SYNTHROID) 112 MCG tablet Take 1 tablet (112 mcg total) by mouth daily before breakfast. 30 tablet 12  . losartan (COZAAR) 25 MG tablet Take  1 tablet (25 mg total) by mouth daily. 30 tablet 2  . metoprolol succinate (TOPROL-XL) 100 MG 24 hr tablet Take 1 tablet (100 mg total) by mouth daily. Take with or immediately following a meal. 30 tablet 2  . Multiple Vitamin (MULTIVITAMIN WITH MINERALS) TABS tablet Take 1 tablet by mouth daily.    . Multiple Vitamins-Minerals (PRESERVISION AREDS 2) CAPS Take 1 capsule by mouth 2 (two) times daily.    . prednisoLONE acetate (PRED FORTE) 1 % ophthalmic suspension Place 1 drop into the right eye daily.    . rosuvastatin (CRESTOR) 5 MG tablet Take 5 mg by mouth See admin instructions. Take one tablet (5 mg) by mouth at night on Sunday, Tuesday, Thursday, Saturday    . sodium chloride (MURO 128) 2 % ophthalmic solution Place 1 drop into the right eye daily.    [name] umeclidinium-vilanterol (ANORO ELLIPTA) 62.5-25 MCG/INH AEPB Inhale 1 puff into the lungs daily.    . vitamin C (ASCORBIC ACID) 500 MG tablet Take 2,000 mg by mouth  daily.     No current facility-administered medications for this visit.     Allergies:   Allopurinol and Tape   Social History:  The patient  reports that she quit smoking about 25 years ago. Her smoking use included cigarettes. She has a 19.50 pack-year smoking history. She has never used smokeless tobacco. She reports that she does not drink alcohol or use drugs.   Family History:  The patient's family history includes Hypertension in her mother; Other in her mother. She was adopted.    ROS:  Please see the history of present illness.   Otherwise, review of systems is positive for none.   All other systems are reviewed and negative.    PHYSICAL EXAM: VS:  BP 120/60   Pulse (!) 101   Ht 5' 2.5" (1.588 m)   Wt 239 lb 9.6 oz (108.7 kg)   SpO2 91%   BMI 43.13 kg/m  , BMI Body mass index is 43.13 kg/m. GEN: Well nourished, well developed, in no acute distress  HEENT: normal  Neck: no JVD, carotid bruits, or masses Cardiac: irregular; no murmurs, rubs, or gallops,no edema  Respiratory:  clear to auscultation bilaterally, normal work of breathing GI: soft, nontender, nondistended, + BS MS: no deformity or atrophy  Skin: warm and dry Neuro:  Strength and sensation are intact Psych: euthymic mood, full affect  EKG:  EKG is not ordered today. Personal review of the ekg ordered 06/11/19 shows atrial fibrillation, left bundle branch block  Recent Labs: 06/12/2019: ALT 20; B Natriuretic Peptide 919.9 07/08/2019: BUN 61; Creatinine, Ser 2.02; Hemoglobin 11.9; Platelets 231; Potassium 5.1; Sodium 140    Lipid Panel     Component Value Date/Time   CHOL 131 06/13/2019 1940   TRIG 71 06/13/2019 1940   HDL 57 06/13/2019 1940   CHOLHDL 2.3 06/13/2019 1940   VLDL 14 06/13/2019 1940   LDLCALC 60 06/13/2019 1940     Wt Readings from Last 3 Encounters:  07/14/19 239 lb 9.6 oz (108.7 kg)  06/25/19 233 lb 3.2 oz (105.8 kg)  06/16/19 242 lb 1.6 oz (109.8 kg)      Other studies  Reviewed: Additional studies/ records that were reviewed today include: TTE 06/13/19  Review of the above records today demonstrates:   1. Left ventricular ejection fraction, by visual estimation, is <20%. The left ventricle has severely decreased function. There is mildly increased left ventricular hypertrophy.  2. Left ventricular diastolic  function could not be evaluated pattern of LV diastolic filling.  3. Global right ventricle has normal systolic function.The right ventricular size is normal. No increase in right ventricular wall thickness.  4. Left atrial size was normal.  5. Right atrial size was normal.  6. Moderate calcification of the mitral valve leaflet(s).  7. Moderate thickening of the mitral valve leaflet(s).  8. The mitral valve is abnormal. Trace mitral valve regurgitation.  9. The tricuspid valve is normal in structure. Tricuspid valve regurgitation is mild. 10. The aortic valve is tricuspid Aortic valve regurgitation is trivial by color flow Doppler. Mild to moderate aortic valve sclerosis/calcification without any evidence of aortic stenosis. 11. The pulmonic valve was normal in structure. Pulmonic valve regurgitation is trivial by color flow Doppler. 12. Moderately elevated pulmonary artery systolic pressure. 13. The inferior vena cava is normal in size with greater than 50% respiratory variability, suggesting right atrial pressure of 3 mmHg. 14. The atrial septum is grossly normal.   ASSESSMENT AND PLAN:  1.  Chronic systolic heart failure due to ischemic cardiomyopathy: Currently on Lasix, Toprol-XL, and losartan.  She is not able to tolerate higher doses of losartan due to poor renal function.  As her ejection fraction is low, Syliva Mee plan for CRT-D implant.  Risks and benefits were discussed which include bleeding, tamponade, infection, pneumothorax.  The patient understands these risks and is agreed to the procedure.  2.  Permanent atrial fibrillation: Currently on  Eliquis.  Heart rate is well controlled.  It appears that she has permanent atrial fibrillation.  No changes.  This patients CHA2DS2-VASc Score and unadjusted Ischemic Stroke Rate (% per year) is equal to 9.7 % stroke rate/year from a score of 6  Above score calculated as 1 point each if present [CHF, HTN, DM, Vascular=MI/PAD/Aortic Plaque, Age if 65-74, or Female] Above score calculated as 2 points each if present [Age > 75, or Stroke/TIA/TE]  3.  Coronary artery disease status post CABG: No current chest pain.  Continue current management.  Case discussed with primary cardiology  Current medicines are reviewed at length with the patient today.   The patient does not have concerns regarding her medicines.  The following changes were made today:  none  Labs/ tests ordered today include:  Orders Placed This Encounter  Procedures  . Basic metabolic panel     Disposition:   FU with Sharmarke Cicio 3 months  Signed, Carmencita Cusic Meredith Leeds, MD  07/14/2019 1:48 PM     [place] [address] [phone] (office) [phone] (fax)

## 2019-07-15 LAB — BASIC METABOLIC PANEL
BUN/Creatinine Ratio: 29 — ABNORMAL HIGH (ref 12–28)
BUN: 57 mg/dL — ABNORMAL HIGH (ref 8–27)
CO2: 23 mmol/L (ref 20–29)
Calcium: 9.6 mg/dL (ref 8.7–10.3)
Chloride: 101 mmol/L (ref 96–106)
Creatinine, Ser: 1.97 mg/dL — ABNORMAL HIGH (ref 0.57–1.00)
GFR calc Af Amer: 27 mL/min/{1.73_m2} — ABNORMAL LOW (ref >59)
GFR calc non Af Amer: 24 mL/min/{1.73_m2} — ABNORMAL LOW (ref >59)
Glucose: 165 mg/dL — ABNORMAL HIGH (ref 65–99)
Potassium: 4.3 mmol/L (ref 3.5–5.2)
Sodium: 141 mmol/L (ref 134–144)

## 2019-07-25 ENCOUNTER — Other Ambulatory Visit (HOSPITAL_COMMUNITY)
Admission: RE | Admit: 2019-07-25 | Discharge: 2019-07-25 | Disposition: A | Discharge status: Home / Self Care | Payer: Medicare Other | Source: Ambulatory Visit | Attending: Cardiology | Admitting: Cardiology

## 2019-07-25 DIAGNOSIS — Z20828 Contact with and (suspected) exposure to other viral communicable diseases: Secondary | ICD-10-CM | POA: Insufficient documentation

## 2019-07-25 DIAGNOSIS — Z01812 Encounter for preprocedural laboratory examination: Secondary | ICD-10-CM | POA: Insufficient documentation

## 2019-07-25 LAB — SARS CORONAVIRUS 2 (TAT 6-24 HRS): SARS Coronavirus 2: NEGATIVE

## 2019-07-28 ENCOUNTER — Ambulatory Visit (HOSPITAL_COMMUNITY)
Admission: AD | Disposition: A | Discharge status: Home / Self Care | Payer: Self-pay | Source: Home / Self Care | Attending: Cardiology

## 2019-07-28 ENCOUNTER — Observation Stay (HOSPITAL_COMMUNITY)
Admission: AD | Admit: 2019-07-28 | Discharge: 2019-07-29 | Disposition: A | Discharge status: Home / Self Care | Payer: Medicare Other | Attending: Cardiology | Admitting: Cardiology

## 2019-07-28 DIAGNOSIS — Z79890 Hormone replacement therapy (postmenopausal): Secondary | ICD-10-CM | POA: Diagnosis not present

## 2019-07-28 DIAGNOSIS — I5022 Chronic systolic (congestive) heart failure: Secondary | ICD-10-CM | POA: Diagnosis present

## 2019-07-28 DIAGNOSIS — Z95818 Presence of other cardiac implants and grafts: Secondary | ICD-10-CM

## 2019-07-28 DIAGNOSIS — Z79899 Other long term (current) drug therapy: Secondary | ICD-10-CM | POA: Diagnosis not present

## 2019-07-28 DIAGNOSIS — Z7982 Long term (current) use of aspirin: Secondary | ICD-10-CM | POA: Diagnosis not present

## 2019-07-28 DIAGNOSIS — I4821 Permanent atrial fibrillation: Secondary | ICD-10-CM | POA: Insufficient documentation

## 2019-07-28 DIAGNOSIS — I447 Left bundle-branch block, unspecified: Secondary | ICD-10-CM | POA: Diagnosis not present

## 2019-07-28 DIAGNOSIS — Z95810 Presence of automatic (implantable) cardiac defibrillator: Secondary | ICD-10-CM | POA: Insufficient documentation

## 2019-07-28 DIAGNOSIS — Z7901 Long term (current) use of anticoagulants: Secondary | ICD-10-CM | POA: Diagnosis not present

## 2019-07-28 DIAGNOSIS — I255 Ischemic cardiomyopathy: Principal | ICD-10-CM | POA: Diagnosis present

## 2019-07-28 HISTORY — PX: BIV ICD INSERTION CRT-D: EP1195

## 2019-07-28 LAB — GLUCOSE, CAPILLARY
Glucose-Capillary: 138 mg/dL — ABNORMAL HIGH (ref 70–99)
Glucose-Capillary: 111 mg/dL — ABNORMAL HIGH (ref 70–99)

## 2019-07-28 LAB — SURGICAL PCR SCREEN
MRSA, PCR: NEGATIVE
Staphylococcus aureus: NEGATIVE

## 2019-07-28 SURGERY — BIV ICD INSERTION CRT-D

## 2019-07-28 MED ORDER — ONDANSETRON HCL 4 MG/2ML IJ SOLN: 4.0000 mg | Freq: Four times a day (QID) | INTRAMUSCULAR | Status: DC | PRN

## 2019-07-28 MED ORDER — ALBUTEROL SULFATE HFA 108 (90 BASE) MCG/ACT IN AERS: 2.0000 | INHALATION_SPRAY | Freq: Four times a day (QID) | RESPIRATORY_TRACT | Status: DC | PRN

## 2019-07-28 MED ORDER — SODIUM CHLORIDE 0.9 % IV SOLN
INTRAVENOUS | Status: DC
  Administered 2019-07-28: 07:00:00 via INTRAVENOUS

## 2019-07-28 MED ORDER — COQ-10 100 MG PO CAPS: 100.0000 mg | ORAL_CAPSULE | Freq: Every day | ORAL | Status: DC

## 2019-07-28 MED ORDER — PROSIGHT PO TABS
1.0000 | ORAL_TABLET | Freq: Every day | ORAL | Status: DC
  Administered 2019-07-28 – 2019-07-29 (×2): 1 via ORAL
  Filled 2019-07-28 (×2): qty 1

## 2019-07-28 MED ORDER — ROSUVASTATIN CALCIUM 5 MG PO TABS: 5.0000 mg | ORAL_TABLET | ORAL | Status: DC

## 2019-07-28 MED ORDER — HEPARIN (PORCINE) IN NACL 1000-0.9 UT/500ML-% IV SOLN
INTRAVENOUS | Status: DC | PRN
  Administered 2019-07-28: 500 mL

## 2019-07-28 MED ORDER — SODIUM CHLORIDE 0.9 % IV SOLN
80.0000 mg | INTRAVENOUS | Status: AC
  Administered 2019-07-28: 80 mg
  Filled 2019-07-28: qty 2

## 2019-07-28 MED ORDER — METOPROLOL SUCCINATE ER 100 MG PO TB24
100.0000 mg | ORAL_TABLET | Freq: Every day | ORAL | Status: DC
  Administered 2019-07-28 – 2019-07-29 (×2): 100 mg via ORAL
  Filled 2019-07-28 (×2): qty 1

## 2019-07-28 MED ORDER — SODIUM CHLORIDE (HYPERTONIC) 2 % OP SOLN
1.0000 [drp] | Freq: Every day | OPHTHALMIC | Status: DC
  Administered 2019-07-29: 1 [drp] via OPHTHALMIC
  Filled 2019-07-28: qty 15

## 2019-07-28 MED ORDER — ACETAMINOPHEN 325 MG PO TABS
650.0000 mg | ORAL_TABLET | Freq: Four times a day (QID) | ORAL | Status: DC
  Administered 2019-07-28 – 2019-07-29 (×3): 650 mg via ORAL
  Filled 2019-07-28 (×2): qty 2

## 2019-07-28 MED ORDER — FENTANYL CITRATE (PF) 100 MCG/2ML IJ SOLN
INTRAMUSCULAR | Status: DC | PRN
  Administered 2019-07-28 (×2): 25 ug via INTRAVENOUS

## 2019-07-28 MED ORDER — APIXABAN 5 MG PO TABS
5.0000 mg | ORAL_TABLET | Freq: Two times a day (BID) | ORAL | Status: DC
  Administered 2019-07-28: 5 mg via ORAL
  Filled 2019-07-28 (×2): qty 1

## 2019-07-28 MED ORDER — CHLORHEXIDINE GLUCONATE 4 % EX LIQD: 4.0000 "application " | Freq: Once | CUTANEOUS | Status: DC

## 2019-07-28 MED ORDER — PRESERVISION AREDS 2 PO CAPS: 1.0000 | ORAL_CAPSULE | Freq: Two times a day (BID) | ORAL | Status: DC

## 2019-07-28 MED ORDER — IOHEXOL 350 MG/ML SOLN
INTRAVENOUS | Status: DC | PRN
  Administered 2019-07-28: 10 mL

## 2019-07-28 MED ORDER — CARBOXYMETHYLCELLULOSE SODIUM 0.5 % OP SOLN: 1.0000 [drp] | Freq: Every day | OPHTHALMIC | Status: DC

## 2019-07-28 MED ORDER — FUROSEMIDE 40 MG PO TABS
80.0000 mg | ORAL_TABLET | Freq: Every day | ORAL | Status: DC
  Administered 2019-07-29: 80 mg via ORAL
  Filled 2019-07-28: qty 2

## 2019-07-28 MED ORDER — SODIUM CHLORIDE 0.9 % IV SOLN
INTRAVENOUS | Status: AC
  Filled 2019-07-28: qty 2

## 2019-07-28 MED ORDER — MUPIROCIN 2 % EX OINT
TOPICAL_OINTMENT | CUTANEOUS | Status: AC
  Administered 2019-07-28: 1
  Filled 2019-07-28: qty 22

## 2019-07-28 MED ORDER — MIDAZOLAM HCL 5 MG/5ML IJ SOLN
INTRAMUSCULAR | Status: AC
  Filled 2019-07-28: qty 5

## 2019-07-28 MED ORDER — MIDAZOLAM HCL 5 MG/5ML IJ SOLN
INTRAMUSCULAR | Status: DC | PRN
  Administered 2019-07-28 (×2): 1 mg via INTRAVENOUS

## 2019-07-28 MED ORDER — FUROSEMIDE 40 MG PO TABS
40.0000 mg | ORAL_TABLET | Freq: Every day | ORAL | Status: DC
  Administered 2019-07-28: 40 mg via ORAL
  Filled 2019-07-28: qty 1

## 2019-07-28 MED ORDER — UMECLIDINIUM-VILANTEROL 62.5-25 MCG/INH IN AEPB
1.0000 | INHALATION_SPRAY | Freq: Every day | RESPIRATORY_TRACT | Status: DC
  Administered 2019-07-29: 1 via RESPIRATORY_TRACT
  Filled 2019-07-28: qty 14

## 2019-07-28 MED ORDER — CEFAZOLIN SODIUM-DEXTROSE 1-4 GM/50ML-% IV SOLN
1.0000 g | Freq: Four times a day (QID) | INTRAVENOUS | Status: AC
  Administered 2019-07-28 – 2019-07-29 (×3): 1 g via INTRAVENOUS
  Filled 2019-07-28 (×3): qty 50

## 2019-07-28 MED ORDER — LIDOCAINE HCL (PF) 1 % IJ SOLN
INTRAMUSCULAR | Status: DC | PRN
  Administered 2019-07-28: 80 mL

## 2019-07-28 MED ORDER — IPRATROPIUM-ALBUTEROL 0.5-2.5 (3) MG/3ML IN SOLN: 3.0000 mL | RESPIRATORY_TRACT | Status: DC | PRN

## 2019-07-28 MED ORDER — FENTANYL CITRATE (PF) 100 MCG/2ML IJ SOLN
INTRAMUSCULAR | Status: AC
  Filled 2019-07-28: qty 2

## 2019-07-28 MED ORDER — VITAMIN C 500 MG PO TABS
2000.0000 mg | ORAL_TABLET | Freq: Every day | ORAL | Status: DC
  Administered 2019-07-28 – 2019-07-29 (×2): 2000 mg via ORAL
  Filled 2019-07-28 (×2): qty 4

## 2019-07-28 MED ORDER — ADULT MULTIVITAMIN W/MINERALS CH
1.0000 | ORAL_TABLET | Freq: Every day | ORAL | Status: DC
  Administered 2019-07-28 – 2019-07-29 (×3): 1 via ORAL
  Filled 2019-07-28 (×2): qty 1

## 2019-07-28 MED ORDER — ASPIRIN EC 81 MG PO TBEC
81.0000 mg | DELAYED_RELEASE_TABLET | Freq: Every day | ORAL | Status: DC
  Administered 2019-07-28 – 2019-07-29 (×2): 81 mg via ORAL
  Filled 2019-07-28 (×2): qty 1

## 2019-07-28 MED ORDER — DULAGLUTIDE 0.75 MG/0.5ML ~~LOC~~ SOAJ: 0.7500 mg | SUBCUTANEOUS | Status: DC

## 2019-07-28 MED ORDER — HYDROCHLOROTHIAZIDE 25 MG PO TABS
12.5000 mg | ORAL_TABLET | Freq: Every day | ORAL | Status: DC
  Administered 2019-07-28 – 2019-07-29 (×2): 12.5 mg via ORAL
  Filled 2019-07-28 (×2): qty 1

## 2019-07-28 MED ORDER — CEFAZOLIN SODIUM-DEXTROSE 2-4 GM/100ML-% IV SOLN: 2.0000 g | INTRAVENOUS | Status: DC

## 2019-07-28 MED ORDER — POLYVINYL ALCOHOL 1.4 % OP SOLN
1.0000 [drp] | OPHTHALMIC | Status: DC | PRN
  Filled 2019-07-28: qty 15

## 2019-07-28 MED ORDER — PREDNISOLONE ACETATE 1 % OP SUSP
1.0000 [drp] | Freq: Every day | OPHTHALMIC | Status: DC
  Administered 2019-07-28 – 2019-07-29 (×2): 1 [drp] via OPHTHALMIC
  Filled 2019-07-28: qty 5

## 2019-07-28 MED ORDER — EZETIMIBE 10 MG PO TABS
10.0000 mg | ORAL_TABLET | Freq: Every day | ORAL | Status: DC
  Administered 2019-07-28 – 2019-07-29 (×2): 10 mg via ORAL
  Filled 2019-07-28 (×2): qty 1

## 2019-07-28 MED ORDER — CEFAZOLIN SODIUM-DEXTROSE 2-3 GM-%(50ML) IV SOLR
INTRAVENOUS | Status: DC | PRN
  Administered 2019-07-28: 2 g via INTRAVENOUS

## 2019-07-28 MED ORDER — LOSARTAN POTASSIUM 25 MG PO TABS
25.0000 mg | ORAL_TABLET | Freq: Every day | ORAL | Status: DC
  Administered 2019-07-28 – 2019-07-29 (×2): 25 mg via ORAL
  Filled 2019-07-28 (×2): qty 1

## 2019-07-28 MED ORDER — CEFAZOLIN SODIUM-DEXTROSE 2-4 GM/100ML-% IV SOLN
INTRAVENOUS | Status: AC
  Filled 2019-07-28: qty 100

## 2019-07-28 MED ORDER — LEVOTHYROXINE SODIUM 112 MCG PO TABS
112.0000 ug | ORAL_TABLET | Freq: Every day | ORAL | Status: DC
  Administered 2019-07-29: 112 ug via ORAL
  Filled 2019-07-28: qty 1

## 2019-07-28 MED ORDER — FERROUS SULFATE 325 (65 FE) MG PO TABS
325.0000 mg | ORAL_TABLET | Freq: Every day | ORAL | Status: DC
  Administered 2019-07-29: 325 mg via ORAL
  Filled 2019-07-28: qty 1

## 2019-07-28 MED ORDER — LIDOCAINE HCL 1 % IJ SOLN
INTRAMUSCULAR | Status: AC
  Filled 2019-07-28: qty 60

## 2019-07-28 MED ORDER — LIDOCAINE HCL 1 % IJ SOLN
INTRAMUSCULAR | Status: AC
  Filled 2019-07-28: qty 20

## 2019-07-28 MED ORDER — ACETAMINOPHEN 325 MG PO TABS
325.0000 mg | ORAL_TABLET | ORAL | Status: DC | PRN
  Administered 2019-07-29: 325 mg via ORAL
  Filled 2019-07-28 (×2): qty 2

## 2019-07-28 MED ORDER — FEBUXOSTAT 40 MG PO TABS
40.0000 mg | ORAL_TABLET | Freq: Every day | ORAL | Status: DC
  Administered 2019-07-28 – 2019-07-29 (×2): 40 mg via ORAL
  Filled 2019-07-28 (×2): qty 1

## 2019-07-28 MED ORDER — HEPARIN (PORCINE) IN NACL 1000-0.9 UT/500ML-% IV SOLN
INTRAVENOUS | Status: AC
  Filled 2019-07-28: qty 500

## 2019-07-28 SURGICAL SUPPLY — 17 items
BALLN COR SINUS VENO 6FR 80 (BALLOONS) ×2
BALLOON COR SINUS VENO 6FR 80 (BALLOONS) IMPLANT
CABLE SURGICAL S-101-97-12 (CABLE) ×2 IMPLANT
CATH CPS DIRECT 135 DS2C020 (CATHETERS) ×1 IMPLANT
CATH CPS QUART CN DS2N029-65 (CATHETERS) ×1 IMPLANT
CPS IMPLANT KIT 410190 (MISCELLANEOUS) ×2 IMPLANT
ICD GALLANT HFCRTD CDHFA500Q (ICD Generator) ×1 IMPLANT
KIT ESSENTIALS PG (KITS) ×1 IMPLANT
LEAD DURATA 7122Q-65CM (Lead) ×1 IMPLANT
LEAD QUARTET 1458QL-86 (Lead) IMPLANT
PAD PRO RADIOLUCENT 2001M-C (PAD) ×2 IMPLANT
QUARTET 1458QL-86 (Lead) ×2 IMPLANT
SHEATH 7FR PRELUDE SNAP 13 (SHEATH) ×1 IMPLANT
SHEATH 8FR PRELUDE SNAP 13 (SHEATH) IMPLANT
TRAY PACEMAKER INSERTION (PACKS) ×2 IMPLANT
WIRE ACUITY WHISPER EDS 4648 (WIRE) ×1 IMPLANT
WIRE HI TORQ VERSACORE-J 145CM (WIRE) ×1 IMPLANT

## 2019-07-28 NOTE — H&P (Signed)
ICD Criteria  Current LVEF:<20%. Within 12 months prior to implant: Yes   Heart failure history: Yes, Class II  Cardiomyopathy history: Yes, Ischemic Cardiomyopathy - Prior MI.  Atrial Fibrillation/Atrial Flutter: Yes, Long standing (> 1 Year).  Ventricular tachycardia history: No.  Cardiac arrest history: No.  History of syndromes with risk of sudden death: No.  Previous ICD: No.  Current ICD indication: Primary  PPM indication: No.  Class I or II Bradycardia indication present: No  Beta Blocker therapy for 3 or more months: Yes, prescribed.   Ace Inhibitor/ARB therapy for 3 or more months: Yes, prescribed.    I have seen Elaine [name] is a 77 y.o. femalepre-procedural and has been referred by Ellyn Hack for consideration of ICD implant for primary prevention of sudden death.  The patient's chart has been reviewed and they meet criteria for ICD implant.  I have had a thorough discussion with the patient reviewing options.  The patient and their family (if available) have had opportunities to ask questions and have them answered. The patient and I have decided together through the [place] [employer] to implant ICD at this time.  Risks, benefits, alternatives to ICD implantation were discussed in detail with the patient today. The patient  understands that the risks include but are not limited to bleeding, infection, pneumothorax, perforation, tamponade, vascular damage, renal failure, MI, stroke, death, inappropriate shocks, and lead dislodgement and  wishes to proceed.

## 2019-07-28 NOTE — Progress Notes (Signed)
Pt leaves cath lab holding area in stable condition. Lt chest CDI. Pt instructions given.

## 2019-07-28 NOTE — Progress Notes (Signed)
PHARMACIST - PHYSICIAN ORDER COMMUNICATION  CONCERNING: P&T Medication Policy on Herbal Medications  DESCRIPTION:  This patient's order for: CoQ-10 100 mg capsule daily  has been noted.  This product(s) is classified as an "herbal" or natural product. Due to a lack of definitive safety studies or FDA approval, nonstandard manufacturing practices, plus the potential risk of unknown drug-drug interactions while on inpatient medications, the Pharmacy and Therapeutics Committee does not permit the use of "herbal" or natural products of this type within [facility].   ACTION TAKEN: The pharmacy department is unable to verify this order at this time and your patient has been informed of this safety policy. Please reevaluate patient's clinical condition at discharge and address if the herbal or natural product(s) should be resumed at that time.

## 2019-07-28 NOTE — Discharge Instructions (Signed)
After Your ICD (Implantable Cardiac Defibrillator)    You have a [employer] ICD   Do not lift your arm above shoulder height for 1 week after your procedure. After 7 days, you may progress as below.     Monday August 04, 2019  Tuesday August 05, 2019 Wednesday August 06, 2019 Thursday August 07, 2019    Do not lift, push, pull, or carry anything over 10 pounds with the affected arm until 6 weeks (Monday September 08, 2019 ) after your procedure.    Do not drive until you have been seen for your wound check, or as long as instructed by your healthcare provider.    Monitor your defibrillator site for redness, swelling, and drainage. Call the device clinic at [phone] if you experience these symptoms or fever/chills.   If your incision is sealed with Steri-strips or staples, you may shower 7 days after your procedure. Do not remove the steri-strips or let the shower hit directly on your site. You may wash around your site with soap and water. If your incision is closed with Dermabond/Surgical glue. You may shower 1 day after your pacemaker implant and wash around the site with soap and water. Avoid lotions, ointments, or perfumes over your incision until it is well-healed.   You may use a hot tub or a pool AFTER your wound check appointment if the incision is completely closed.   Your ICD  may be MRI compatible. This will be discussed at your next office visit/wound check.    Your ICD is designed to protect you from life threatening heart rhythms. Because of this, you may receive a shock.   o 1 shock with no symptoms:  Call the office during business hours. o 1 shock with symptoms (chest pain, chest pressure, dizziness, lightheadedness, shortness of breath, overall feeling unwell):  Call 911. o If you experience 2 or more shocks in 24 hours:  Call 911. o If you receive a shock, you should not drive for 6 months per the [place] DMV IF you receive appropriate therapy from your ICD.     ICD Alerts:  Some alerts are vibratory and others beep. These are NOT emergencies. Please call our office to let us know. If this occurs at night or on weekends, it can wait until the next business day. Send a remote transmission.   If your device is capable of reading fluid status (for heart failure), you will be offered monthly monitoring to review this with you.    Remote monitoring is used to monitor your ICD from home. This monitoring is scheduled every 91 days by our office. It allows [place] to keep an eye on the functioning of your device to ensure it is working properly. You will routinely see your Electrophysiologist annually (more often if necessary).    Cardioverter Defibrillator Implantation, Care After This sheet gives you information about how to care for yourself after your procedure. Your health care provider may also give you more specific instructions. If you have problems or questions, contact your health care provider. What can I expect after the procedure? After the procedure, it is common to have:  Some pain. It may last a few days.  A slight bump over the skin where the device was placed. Sometimes, it is possible to feel the device under the skin. This is normal.  During the months and years after your procedure, your health care provider will check the device, the leads, and the battery every few months.  Eventually, when the battery is low, the device will be replaced.  You should receive your defibrillator ID card for your new device in the next 4-8 weeks.  Follow these instructions at home: Medicines  Take over-the-counter and prescription medicines only as told by your health care provider.  If you were prescribed an antibiotic medicine, take it as told by your health care provider. Do not stop taking the antibiotic even if you start to feel better. Incision care        Follow instructions from your health care provider about how to take care of your  incision area. Make sure you: ? Leave stitches (sutures), skin glue, or adhesive strips in place. These skin closures may need to stay in place for 2 weeks or longer. If adhesive strip edges start to loosen and curl up, you may trim the loose edges. Do not remove adhesive strips completely unless your health care provider tells you to do that.  Check your incision area every day for signs of infection. Check for: ? More redness, swelling, or pain. ? More fluid or blood. ? Warmth. ? Pus or a bad smell.  Do not use lotions or ointments near the incision area unless told by your health care provider.  Keep the incision area clean and dry for 7 days after the procedure or for as long as told by your health care provider. It takes several weeks for the incision site to heal completely.  Do not take baths, swim, or use a hot tub until your health care provider approves. Activity  Try to walk a little every day. Exercising is important after this procedure. Also, use your shoulder on the side of the defibrillator in daily tasks that do not require a lot of motion.  For at least 1 week: ? Do not lift your upper arm above your shoulders. This means no tennis, golf, or swimming for this period of time. If you tend to sleep with your arm above your head, use a restraint to prevent this during sleep.  For at least 6 weeks: ? Avoid sudden jerking, pulling, or chopping movements that pull your upper arm far away from your body.  Ask your health care provider when you may go back to work.  Check with your health care provider before you start to drive or play sports. Electric and magnetic fields  Tell all health care providers that you have a defibrillator. This may prevent them from giving you an MRI scan because strong magnets are used for that test.  If you must pass through a metal detector, quickly walk through it. Do not stop under the detector, and do not stand near it.  Avoid places or  objects that have a strong electric or magnetic field, including: ? Airport Herbalist. At the airport, let officials know that you have a defibrillator. Your defibrillator ID card will let you be checked in a way that is safe for you and will not damage your defibrillator. Also, do not let a security person wave a magnetic wand near your defibrillator. That can make it stop working. ? Power plants. ? Large electrical generators. ? Anti-theft systems or electronic article surveillance (EAS). ? Radiofrequency transmission towers, such as cell phone and radio towers.  Do not use amateur (ham) radio equipment or electric (arc) welding torches. Some devices are safe to use if held at least 12 inches (30 cm) from your defibrillator. These include power tools, lawn mowers, and speakers. If you  are unsure if something is safe to use, ask your health care provider.  Do not use MP3 player headphones. They have magnets.  You may safely use electric blankets, heating pads, computers, and microwave ovens.  When using your cell phone, hold it to the ear that is on the opposite side from the defibrillator. Do not leave your cell phone in a pocket over the defibrillator. General instructions  Follow diet instructions from your health care provider, if this applies.  Always keep your defibrillator ID card with you. The card should list the implant date, device model, and manufacturer. Consider wearing a medical alert bracelet or necklace.  Have your defibrillator checked every 3-6 months or as often as told by your health care provider. Most defibrillators last for 4-8 years.  Keep all follow-up visits as told by your health care provider. This is important for your health care provider to make sure your chest is healing the way it should. Ask your health care provider when you should come back to have your stitches or staples taken out. Contact a health care provider if:  You gain weight  suddenly.  Your legs or feet swell more than they have before.  It feels like your heart is fluttering or skipping beats (heart palpitations).  You have more redness, swelling, or pain around your incision.  You have more fluid or blood coming from your incision.  Your incision feels warm to the touch.  You have pus or a bad smell coming from your incision.  You have a fever. Get help right away if:  You have chest pain.  You feel more than one shock.  You feel more short of breath than you have felt before.  You feel more light-headed than you have felt before.  Your incision starts to open up. This information is not intended to replace advice given to you by your health care provider. Make sure you discuss any questions you have with your health care provider.

## 2019-07-28 NOTE — Discharge Summary (Addendum)
ELECTROPHYSIOLOGY PROCEDURE DISCHARGE SUMMARY    Patient ID: Elaine [name],  MRN: [id number], DOB/AGE: [birth year] 78 y.o.  Admit date: 07/28/2019 Discharge date: 07/29/19   Primary Care Physician: Marton Redwood, MD  Primary Cardiologist: Glenetta Hew, MD  Electrophysiologist: Constance Haw, MD   Primary Diagnosis:  Chronic systolic CHF   Secondary Diagnosis: ICM LBBB Permanent Atrial fibrillation  Allergies  Allergen Reactions  . Crestor [Rosuvastatin Calcium] Other (See Comments)    Cramps- still taking   . Allopurinol Hives  . Tape Rash    Adhesive tape     Procedures This Admission:  1.  Implantation of a St. Jude BiV ICD on 07/28/2019 by Dr. Curt Bears.  The patient received a St Jude model number [id number] ICD with [place] [id number] right ventricular lead, and St Jude Quartet 1458QL/86 left ventricular lead. No RA lead with permanent AF DFT's were deferred at time of implant. There were no immediate post procedure complications. 2.  CXR on 07/29/19  demonstrated no pneumothorax status post device implantation.  Brief HPI: Elaine [name] is a 78 y.o. female was referred to electrophysiology in the outpatient setting for consideration of ICD implantation.  Past medical history includes above.  The patient has persistent LV dysfunction despite guideline directed therapy.  Risks, benefits, and alternatives to ICD implantation were reviewed with the patient who wished to proceed.   Hospital Course:  The patient was admitted and underwent implantation of a St. Jude BiV ICD with details as outlined above. They were monitored on telemetry overnight which demonstrated appropriate pacing.  Left chest was without hematoma or ecchymosis.  The device was interrogated and found to be functioning normally.  CXR was obtained and demonstrated no pneumothorax status post device implantation..  Wound care, arm mobility, and restrictions were reviewed with the patient.  The  patient was examined and considered stable for discharge to home.   Small hematoma noted. No pressure dressing placed at this time. Plan to Hold Eliquis/ASA until Saturday. Pt knows to call if hematoma worsens.  The patient's discharge medications include an ACE-I/ARB/ARNI (losartan) and beta blocker (Toprol/xl).    Physical Exam: Vitals:   07/28/19 2007 07/29/19 0448 07/29/19 0756 07/29/19 0843  BP: 108/66 (!) 101/55  115/64  Pulse: 79 79  83  Resp: 15 15  18   Temp: 98.7 F (37.1 C) 98.4 F (36.9 C)  98.1 F (36.7 C)  TempSrc: Oral Oral  Oral  SpO2: 96% 95% 97%   Weight:      Height:        GEN- The patient is well appearing, alert and oriented x 3 today.   HEENT: normocephalic, atraumatic; sclera clear, conjunctiva pink; hearing intact; oropharynx clear; neck supple, no JVP Lymph- no cervical lymphadenopathy Lungs- Clear to ausculation bilaterally, normal work of breathing.  No wheezes, rales, rhonchi Heart- Regular rate and rhythm, no murmurs, rubs or gallops, PMI not laterally displaced GI- soft, non-tender, non-distended, bowel sounds present, no hepatosplenomegaly Extremities- no clubbing, cyanosis, or edema; DP/PT/radial pulses 2+ bilaterally MS- no significant deformity or atrophy Skin- warm and dry, no rash or lesion. ICD site with hematoma. Psych- euthymic mood, full affect Neuro- strength and sensation are intact   Labs:   Lab Results  Component Value Date   WBC 6.6 07/08/2019   HGB 11.9 07/08/2019   HCT 37.1 07/08/2019   MCV 89 07/08/2019   PLT 231 07/08/2019   No results for input(s): NA, K, CL, CO2, BUN, CREATININE,  CALCIUM, PROT, BILITOT, ALKPHOS, ALT, AST, GLUCOSE in the last 168 hours.  Invalid input(s): LABALBU  Discharge Medications:  Allergies as of 07/29/2019      Reactions   Crestor [rosuvastatin Calcium] Other (See Comments)   Cramps- still taking    Allopurinol Hives   Tape Rash   Adhesive tape      Medication List    TAKE these  medications   acetaminophen 650 MG CR tablet Commonly known as: TYLENOL Take 1,300 mg by mouth 2 (two) times daily.   Anoro Ellipta 62.5-25 MCG/INH Aepb Generic drug: umeclidinium-vilanterol Inhale 1 puff into the lungs daily.   apixaban 5 MG Tabs tablet Commonly known as: Eliquis Take 1 tablet (5 mg total) by mouth 2 (two) times daily. Resume Saturday evening. Start taking on: August 02, 2019 What changed:   additional instructions  These instructions start on August 02, 2019. If you are unsure what to do until then, ask your doctor or other care provider.   aspirin EC 81 MG tablet Take 1 tablet (81 mg total) by mouth daily. Start taking on: August 02, 2019 What changed: These instructions start on August 02, 2019. If you are unsure what to do until then, ask your doctor or other care provider.   AZO-CRANBERRY PO Take 1 tablet by mouth 2 (two) times daily.   carboxymethylcellulose 0.5 % Soln Commonly known as: REFRESH PLUS Place 1 drop into the left eye daily.   CoQ-10 100 MG Caps Take 100 mg by mouth daily.   ezetimibe 10 MG tablet Commonly known as: ZETIA Take 10 mg by mouth daily.   febuxostat 40 MG tablet Commonly known as: ULORIC Take 40 mg by mouth daily.   ferrous sulfate 325 (65 FE) MG tablet TAKE ONE TABLET BY MOUTH ONCE DAILY WITH  BREAKFAST What changed: See the new instructions.   furosemide 40 MG tablet Commonly known as: LASIX Take 1 tablet (40 mg total) by mouth daily. Take 2 tablets (80mg  total) in the morning and 1 tablet (40 mg total) in the evening. What changed:   how much to take  when to take this  additional instructions   hydrochlorothiazide 12.5 MG tablet Commonly known as: HYDRODIURIL Take 12.5 mg by mouth daily.   ipratropium-albuterol 0.5-2.5 (3) MG/3ML Soln Commonly known as: DUONEB Take 3 mLs by nebulization every 4 (four) hours as needed (for shortness of breath).   levothyroxine 112 MCG tablet Commonly known as:  Synthroid Take 1 tablet (112 mcg total) by mouth daily before breakfast.   losartan 25 MG tablet Commonly known as: COZAAR Take 1 tablet (25 mg total) by mouth daily.   metoprolol succinate 100 MG 24 hr tablet Commonly known as: TOPROL-XL Take 1 tablet (100 mg total) by mouth daily. Take with or immediately following a meal.   multivitamin with minerals Tabs tablet Take 1 tablet by mouth daily.   prednisoLONE acetate 1 % ophthalmic suspension Commonly known as: PRED FORTE Place 1 drop into the right eye daily.   PreserVision AREDS 2 Caps Take 1 capsule by mouth 2 (two) times daily.   ProAir HFA 108 (90 Base) MCG/ACT inhaler Generic drug: albuterol Inhale 2 puffs into the lungs every 6 (six) hours as needed for wheezing or shortness of breath.   rosuvastatin 5 MG tablet Commonly known as: CRESTOR Take 5 mg by mouth See admin instructions. Take one tablet (5 mg) by mouth at night on Sunday, Tuesday, Thursday, Saturday   sodium chloride 2 % ophthalmic solution  Commonly known as: MURO 128 Place 1 drop into the right eye daily.   Trulicity 5.52 ZV/4.7FT Sopn Generic drug: Dulaglutide Inject 0.75 mg into the skin every Thursday.   vitamin C 500 MG tablet Commonly known as: ASCORBIC ACID Take 2,000 mg by mouth daily.       Disposition:   Follow-up Information    Constance Haw, MD Follow up on 10/27/2019.   Specialty: Cardiology Why: at 315 for 3 month post ICD check Contact information: [address] [place] [address] [phone]        [facility] Follow up on 08/07/2019.   Why: at 2 pm for post ICD wound check Contact information: [place] [phone]          Duration of Discharge Encounter: Greater than 30 minutes including physician time.  Signed, Shirley Friar, PA-C  07/29/2019 9:27 AM  I have seen and examined  this patient with Oda Kilts.  Agree with above, note added to reflect my findings.  On exam, RRR, no murmurs, lungs clear.  She is now status post St. Jude ICD implant for ischemic cardiomyopathy.  Device functioning appropriately.  Chest x-ray and interrogation without issue.  Plan for discharge today with follow-up in device clinic.  Nadezhda Pollitt M. Gregory Barrick MD 07/29/2019 9:34 AM

## 2019-07-29 ENCOUNTER — Other Ambulatory Visit: Payer: Self-pay

## 2019-07-29 ENCOUNTER — Encounter (HOSPITAL_COMMUNITY): Payer: Self-pay | Admitting: *Deleted

## 2019-07-29 ENCOUNTER — Inpatient Hospital Stay (HOSPITAL_COMMUNITY): Payer: Medicare Other

## 2019-07-29 DIAGNOSIS — I5022 Chronic systolic (congestive) heart failure: Secondary | ICD-10-CM | POA: Diagnosis present

## 2019-07-29 DIAGNOSIS — I447 Left bundle-branch block, unspecified: Secondary | ICD-10-CM | POA: Diagnosis not present

## 2019-07-29 DIAGNOSIS — I255 Ischemic cardiomyopathy: Secondary | ICD-10-CM

## 2019-07-29 DIAGNOSIS — Z95810 Presence of automatic (implantable) cardiac defibrillator: Secondary | ICD-10-CM | POA: Diagnosis not present

## 2019-07-29 DIAGNOSIS — I4821 Permanent atrial fibrillation: Secondary | ICD-10-CM | POA: Diagnosis not present

## 2019-07-29 DIAGNOSIS — Z79890 Hormone replacement therapy (postmenopausal): Secondary | ICD-10-CM | POA: Diagnosis not present

## 2019-07-29 DIAGNOSIS — Z7982 Long term (current) use of aspirin: Secondary | ICD-10-CM | POA: Diagnosis not present

## 2019-07-29 DIAGNOSIS — Z7901 Long term (current) use of anticoagulants: Secondary | ICD-10-CM | POA: Diagnosis not present

## 2019-07-29 DIAGNOSIS — Z79899 Other long term (current) drug therapy: Secondary | ICD-10-CM | POA: Diagnosis not present

## 2019-07-29 MED ORDER — APIXABAN 5 MG PO TABS: 5.0000 mg | ORAL_TABLET | Freq: Two times a day (BID) | ORAL | Status: DC

## 2019-07-29 MED ORDER — ASPIRIN EC 81 MG PO TBEC: 81.0000 mg | DELAYED_RELEASE_TABLET | Freq: Every day | ORAL | Status: DC

## 2019-07-29 MED FILL — Lidocaine HCl Local Inj 1%: INTRAMUSCULAR | Qty: 80 | Status: AC

## 2019-07-29 MED FILL — Cefazolin Sodium-Dextrose IV Solution 2 GM/100ML-4%: INTRAVENOUS | Qty: 100 | Status: AC

## 2019-07-29 NOTE — Progress Notes (Signed)
Pt  Provided discharge instructions and education. Pt vitals stable. PT telebox removed/ccmd notified. Pt has all belongings including sling. Pt denies any complaints. Pt tx via wheelchair to valet to meet ride.  Jerald Kief, RN

## 2019-08-02 ENCOUNTER — Telehealth: Payer: Self-pay | Admitting: Cardiology

## 2019-08-02 NOTE — Telephone Encounter (Signed)
Pt called in reporting bruising at her PPM site which was done on 12/1 with Dr. Curt Bears. Has also had intermittent episodes of shooting pain from site into breast at times. No bleeding at site, and area is clean and dry. Advised she should use tylenol, no NSAIDs as she is on Eliquis/ASA. Also instructed she could use ice to bruised areas. Keep follow up appt for device check. She understood and thanked me for follow up call back.

## 2019-08-04 ENCOUNTER — Telehealth: Payer: Self-pay | Admitting: Cardiology

## 2019-08-04 NOTE — Telephone Encounter (Signed)
New Message   Patient is calling to advise that since she has to use a wheelchair that someone will be coming with her to her appt.

## 2019-08-04 NOTE — Telephone Encounter (Signed)
LMOM that 1 person may come with patient to appointment due to the use of wheel chair.

## 2019-08-07 ENCOUNTER — Other Ambulatory Visit: Payer: Self-pay

## 2019-08-07 ENCOUNTER — Ambulatory Visit (INDEPENDENT_AMBULATORY_CARE_PROVIDER_SITE_OTHER): Payer: Medicare Other | Admitting: *Deleted

## 2019-08-07 DIAGNOSIS — I255 Ischemic cardiomyopathy: Secondary | ICD-10-CM | POA: Diagnosis not present

## 2019-08-07 LAB — CUP PACEART INCLINIC DEVICE CHECK
Brady Statistic RV Percent Paced: 89 %
Date Time Interrogation Session: 20201210174400
Implantable Lead Implant Date: 20201130
Implantable Lead Implant Date: 20201130
Implantable Lead Location: 753858
Implantable Lead Location: 753860
Implantable Pulse Generator Implant Date: 20201130
Lead Channel Pacing Threshold Amplitude: 0.5 V
Lead Channel Pacing Threshold Amplitude: 0.5 V
Lead Channel Pacing Threshold Pulse Width: 0.5 ms
Lead Channel Pacing Threshold Pulse Width: 0.5 ms
Lead Channel Sensing Intrinsic Amplitude: 12 mV
Pulse Gen Serial Number: 810000193

## 2019-08-07 NOTE — Patient Instructions (Signed)
Call if you have increased edema, any drainage or redness at incision site.

## 2019-08-07 NOTE — Progress Notes (Signed)
Wound check appointment. Steri-strips removed. Wound without redness or edema. Incision edges approximated, wound well healed. Normal device function. Thresholds, sensing, and impedances consistent with implant measurements. Device programmed at 3.5V for extra safety margin until 3 month visit. Histogram distribution appropriate for patient and level of activity. No ventricular arrhythmias noted. Patient educated about wound care, arm mobility, lifting restrictions, shock plan. Remote follow-ups scheduled and next remote 10/27/19. ROV ion 10/27/19 with Dr Curt Bears.

## 2019-08-08 ENCOUNTER — Telehealth: Payer: Self-pay | Admitting: Cardiology

## 2019-08-08 NOTE — Telephone Encounter (Signed)
Spoke with pt, she was seen yesterday and forgot to ask when she can restart wearing a bra and when she can drive. Will forward to the device clinic to answer the patients questions.

## 2019-08-08 NOTE — Telephone Encounter (Signed)
New Message    Pt is calling and is wondering when she can get dressed with under garments.  She is also wondering when she can drive again.    Please call back

## 2019-08-11 NOTE — Telephone Encounter (Signed)
LMOM requesting call back to DC. Direct number given.

## 2019-08-11 NOTE — Telephone Encounter (Signed)
Patient returned call. Advised she can resume wearing a bra and driving, as long as another provider has not told her to avoid driving. Pt verbalizes understanding, no further questions at this time.

## 2019-08-28 ENCOUNTER — Ambulatory Visit: Payer: Medicare Other

## 2019-09-25 ENCOUNTER — Ambulatory Visit (INDEPENDENT_AMBULATORY_CARE_PROVIDER_SITE_OTHER): Payer: Medicare Other | Admitting: Cardiology

## 2019-09-25 ENCOUNTER — Encounter (INDEPENDENT_AMBULATORY_CARE_PROVIDER_SITE_OTHER): Payer: Self-pay

## 2019-09-25 ENCOUNTER — Telehealth (HOSPITAL_COMMUNITY): Payer: Self-pay

## 2019-09-25 ENCOUNTER — Other Ambulatory Visit: Payer: Self-pay

## 2019-09-25 ENCOUNTER — Encounter: Payer: Self-pay | Admitting: Cardiology

## 2019-09-25 VITALS — BP 109/62 | HR 87 | Ht 63.5 in | Wt 244.0 lb

## 2019-09-25 DIAGNOSIS — Z951 Presence of aortocoronary bypass graft: Secondary | ICD-10-CM

## 2019-09-25 DIAGNOSIS — I10 Essential (primary) hypertension: Secondary | ICD-10-CM | POA: Diagnosis not present

## 2019-09-25 DIAGNOSIS — I4821 Permanent atrial fibrillation: Secondary | ICD-10-CM | POA: Diagnosis not present

## 2019-09-25 DIAGNOSIS — I255 Ischemic cardiomyopathy: Secondary | ICD-10-CM | POA: Diagnosis not present

## 2019-09-25 DIAGNOSIS — I2510 Atherosclerotic heart disease of native coronary artery without angina pectoris: Secondary | ICD-10-CM | POA: Diagnosis not present

## 2019-09-25 DIAGNOSIS — I5042 Chronic combined systolic (congestive) and diastolic (congestive) heart failure: Secondary | ICD-10-CM

## 2019-09-25 NOTE — Telephone Encounter (Signed)

## 2019-09-25 NOTE — Patient Instructions (Signed)
.  Medication Instructions:   no change with medication at present time other than the below instructions.  will obtain  Last notes from  [facility].   Today and tomorrow - take lasix ( furosemide)   120 mg in the morning  40 mg  In the late afternoon  *If you need a refill on your cardiac medications before your next appointment, please call your pharmacy*  Lab Work: Not needed  Testing/Procedures:  not needed  Follow-Up: At [facility], you and your health needs are our priority.  As part of our continuing mission to provide you with exceptional heart care, we have created designated Provider Care Teams.  These Care Teams include your primary Cardiologist (physician) and Advanced Practice Providers (APPs -  Physician Assistants and Nurse Practitioners) who all work together to provide you with the care you need, when you need it.  Your next appointment:   4 month(s)  The format for your next appointment:   In Person  Provider:   Glenetta Hew, MD  Other Instructions  Sliding scale Lasix: Weigh yourself when you get home, then Daily in the Morning. Your dry weight will be what your scale says on the day you return home.(here is  238 lb lbs.).   If you gain more than 3 pounds from dry weight: Increase the Lasix dosing to  120 mg in the morning and  40 mg in the afternoon until weight returns to baseline dry weight.

## 2019-09-25 NOTE — Progress Notes (Signed)
PCP: Elaine Redwood, MD  Clinic Note: Chief Complaint  Patient presents with  . Follow-up    Doing better, but still a little bit up in weight  . Cardiomyopathy  . Atrial Fibrillation    Now status post BiV ICD    HPI:    Elaine [name] is a 79 y.o. female with a history of notable for CAD having CABG and LAA clipping, and ischemic cardiomyopathy initially 20-25% of the 45% with recent reduction again to <20% secondary to A. fib RVR and LBBB who is now status post ICD placement last fall.  She now presents for 3-59-month follow-up   Her first presentation was in February 2015--presenting as A. fib RVR with LBBB as possible STEMI.  She was taken to the Cath Lab and found to have multivessel CAD with high-grade proximal LAD disease along with moderate Left Main.  She was referred for urgent CABG with LAA clipping.  EF improved from 20-25% up to 45%.  Elaine [name] was last seen here in October 2020 as a delayed follow-up from previously been seen 2019).  She had been doing really well until about June or July, having lost down to 206 pounds on stable diuretic.  Unfortunately she apparently started to decompensate in July noting more shortness of breath and worsening edema.  Her weight is gone up and down but was as high as 256 pounds during her visit with me.  She has had progressively worsening symptoms, have been diagnosed with aspiration pneumonia and was treated with metolazone and HCTZ that was able to get her weight down about 246 pounds but had gone back above 250.  She noted edema all the way up into her abdomen along with PND and orthopnea..  She had been on 24-hour oxygen, was noting orthopnea.  Was profoundly dyspneic simply walking to the exam room. --> I referred her for hospitalization for acute on chronic combined systolic complicated by A. Fib.  Recent Hospitalizations: None  June 12, 2019: Echo showed an EF of less than 20% with severely reduced LV function.  She was  diuresed 4.2 L.  With intermittent cessation of diuretic because of renal insufficiency.  Lisinopril was changed to losartan.  Discharge weight was 214 pounds.  Was not discharged on metolazone.  Plan was to convert from 25 mg losartan to Entresto if blood pressure and renal function tolerate.  Was also converted from carvedilol to Toprol for better rate control.-->    Plan was EP evaluation for BiV ICD  Recommended adding [place].   She was seen by Dr. Curt [name] later on in October and then again on November 16-planned CRT-D  CRT-D (BiV ICD) placement on 07/28/2019  Reviewed  CV studies:    The following studies were reviewed today: (if available, images/films reviewed: From Epic Chart or Care Everywhere)  Echocardiogram 06/13/2019: EF<20% with severely reduced function.  Global HK.  Interestingly both atria are normal size.  Mild have moderate aortic sclerosis but no stenosis.  Moderate elevated PA pressures.  Interval History:   "Elaine [name]" returns today for 57-month follow-up 2 months out BiV ICD placement.  She is a hard time giving a good history, but says that for the most part her weights at home have been ranging around 240pounds which was this morning.  She has been as low as 236 and felt better at that time.  Her edema seems to be up and down, and she is very limited as far as mobility goes.  Still  has her leg in a brace.  What she really notes is the feeling of the vibration of his of her ICD when she lies on one side.  Really is trying to sleep on the other side.  She is back to being little bit more active in her life however going to her is church classes at least 3 days a week before but is now only doing it a once a week.  She still not able do much walking and will get short of breath but more limited because of her leg.  Still has orthopnea sleeping more in a recliner.  No chest pain or pressure.  No sensation of irregular heartbeats or palpitations just a vibration feeling of her ICD.   Cardiovascular review of symptoms (summary): positive for - dyspnea on exertion, edema, orthopnea, paroxysmal nocturnal dyspnea and Intolerance to pretty much any activity partially because of unsteady gait.  Also short of breath and dizzy when bending over. negative for - chest pain, irregular heartbeat, palpitations, rapid heart rate or Syncope/near syncope or TIA/amaurosis fugax.  Does not really walk far enough to note claudication  The patient DOES all Not have symptoms concerning for COVID-19 infection (fever, chills, cough, or new shortness of breath).  The patient is practicing social distancing and masking.  Her family helps out with most of her necessities as far shopping etc.   REVIEWED OF SYSTEMS   ROS: A comprehensive was performed. Review of Systems  Constitutional: Positive for malaise/fatigue (Energy level seems to be better than it was and I saw her last). Negative for chills, fever and weight loss (Unable to lose back the weight that she has gained as likely fluid weight.).  HENT: Negative for congestion and nosebleeds.   Respiratory: Positive for shortness of breath. Negative for cough (This actually improved with treatment of what was thought to be aspiration pneumonia) and wheezing.   Cardiovascular: Positive for leg swelling.  Gastrointestinal: Positive for heartburn. Negative for abdominal pain, blood in stool, constipation and melena.  Genitourinary: Negative for hematuria.  Musculoskeletal: Positive for joint pain (Left ankle). Negative for falls (Not recently, but does have but poor balance) and neck pain.  Neurological: Positive for dizziness (With certain activities including bending over) and weakness (Left leg). Negative for focal weakness, seizures and headaches.  Psychiatric/Behavioral: Negative for memory loss. The patient has insomnia (Does not sleep well). The patient is not nervous/anxious.   All other systems reviewed and are negative.  I have reviewed  and (if needed) personally updated the patient's problem list, medications, allergies, past medical and surgical history, social and family history.   PAST MEDICAL HISTORY   Past Medical History:  Diagnosis Date  . Acute myocardial infarction of anterior wall (HCC) 10/20/2013   Afib, LBBB  . Acute pulmonary edema with congestive heart failure ([place]) 10/20/2013   Resolved  . Arthritis   . Atrial fibrillation ([place]) 10/20/2013   On Warfarin  . CAD, multiple vessel 10/20/2013   LM, LAD & RCA --> CABG; MYOVIEW 12/'15: Intermediate Risk would large anterior, anteroapical segment the apex possible infarct and peri-infarct ischemia  . CHF (congestive heart failure) (HCC)    EF 40-45%.  . Chronic kidney disease   . COPD (chronic obstructive pulmonary disease) ([place])   . Diabetic neuropathy ([place])   . Heart murmur    Likely related to aortic sclerosis  . Hyperlipidemia   . Hypertension   . Hypothyroidism   . Ischemic cardiomyopathy - Notable Improvement in EF post  CABG 10/20/2013   Echo 2/23: EF 20-25%; mild LVH. anteroseptal Akinesis; mid-apical anterior, inferior & inferoseptal + apical-lateral akinesis; G 1 DDysfxn.; Mild-Mod LA dil; mild MR; Mod PHTN;; F/u Echo 12/2013: EF 40-45%, septal & apical HK, mild LVH; Mod LA dilation  . Left bundle branch block (LBBB) on electrocardiogram   . Mild mitral regurgitation by prior echocardiogram    Mild-Mod MR on Echo  . Morbid obesity ([place])   . OSTEOARTHRITIS 08/06/2006  . S/P CABG x 3 10/23/2013   LIMA to LAD, SVG to OM2, SVG to RPLB, EVH via right thigh  . Type II diabetes mellitus with complication ([place]) 93/90/3009   off rx since heart attack 12/15-dr told could stay off if keep cbg under 150  . UTI (lower urinary tract infection) 09/29/14   ongoing now. started rx 09/28/14     PAST SURGICAL HISTORY   Past Surgical History:  Procedure Laterality Date  . ABDOMINAL HYSTERECTOMY    . BIV ICD INSERTION CRT-D N/A 07/28/2019   Procedure: BIV ICD  INSERTION CRT-D;  Surgeon: Constance Haw, MD;  Location: [place] [facility];  Service: Cardiovascular;  Laterality: N/A;  . BREAST LUMPECTOMY W/ NEEDLE LOCALIZATION Right 10/06/2014   Dr Georgette Dover  . BREAST LUMPECTOMY WITH NEEDLE LOCALIZATION Right 10/06/2014   Procedure: RIGHT BREAST LUMPECTOMY WITH NEEDLE LOCALIZATION;  Surgeon: Donnie Mesa, MD;  Location: [place];  Service: General;  Laterality: Right;  . CLIPPING OF ATRIAL APPENDAGE N/A 10/23/2013   Procedure: CLIPPING OF ATRIAL APPENDAGE;  Surgeon: Rexene Alberts, MD;  Location: [place];  Service: Open Heart Surgery;  Laterality: N/A;  . 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  . CORONARY ARTERY BYPASS GRAFT N/A 10/23/2013   Procedure: CORONARY ARTERY BYPASS GRAFTING (CABG);  Surgeon: Rexene Alberts, MD;  Location: [place];  Service: Open Heart Surgery: LIMA-LAD, SVG-OM2, SVG-RPL  . INTRAOPERATIVE TRANSESOPHAGEAL ECHOCARDIOGRAM N/A 10/23/2013   Procedure: INTRAOPERATIVE TRANSESOPHAGEAL ECHOCARDIOGRAM;  Surgeon: Rexene Alberts, MD;  Location: [place];  Service: Open Heart Surgery;  Laterality: N/A;  . LEFT HEART CATHETERIZATION WITH CORONARY ANGIOGRAM N/A 10/20/2013   Procedure: LEFT HEART CATHETERIZATION WITH CORONARY ANGIOGRAM;  Surgeon: Leonie Man, MD;  Location[facility] CATH LAB;  Service: Cardiovascular: Distal LM ~70%, LAD - ostial 70%, prox 80, 95 & 95%; RCA prox 70%, mid 80%; minimal Cx disease  . LEFT HEART CATHETERIZATION WITH CORONARY/GRAFT ANGIOGRAM N/A 09/08/2014   Procedure: LEFT HEART CATHETERIZATION WITH Beatrix Fetters;  Surgeon: Leonie Man, MD;  Location: [facility] CATH LAB: Indication: Abnormal Myoview. Occluded LAD after 70 and 80% left main. Diffuse RCA disease. Widely patent grafts to moderately diseased artery vessels. Mild-moderate LV dysfunction and chronic A. fib.  [name] NM MYOVIEW LTD  08/14/2014   Lexi scan: INTERMEDIATE RISK - large, severe intensity perfusion defect in anterior wall, apex and inferior apex that is partly  reversible. This suggests either scar or possible hibernating myocardium. Nondeviated.-> Likely scar. No change in coronary anatomy with patent grafts.  [name] TOTAL HIP ARTHROPLASTY  2010, 2011   right 2010, left 2011  . TRANSTHORACIC ECHOCARDIOGRAM  12/2013   Mildly dilated left ventricle with mild to moderately reduced function. EF 40-45% (notable improvement from February 2015). Septal and apical hypokinesis. Mild LA dilation.    MEDICATIONS/ALLERGIES   Current Meds  Medication Sig  . acetaminophen (TYLENOL) 650 MG CR tablet Take 1,300 mg by mouth 2 (two) times daily.  [name] albuterol (PROAIR HFA) 108 (90 Base) MCG/ACT inhaler Inhale  2 puffs into the lungs every 6 (six) hours as needed for wheezing or shortness of breath.  [name] apixaban (ELIQUIS) 5 MG TABS tablet Take 1 tablet (5 mg total) by mouth 2 (two) times daily. Resume Saturday evening.  [name] aspirin EC 81 MG tablet Take 1 tablet (81 mg total) by mouth daily.  . AZO-CRANBERRY PO Take 1 tablet by mouth 2 (two) times daily.   . carboxymethylcellulose (REFRESH PLUS) 0.5 % SOLN Place 1 drop into the left eye daily.  . Coenzyme Q10 (COQ-10) 100 MG CAPS Take 100 mg by mouth daily.  . Dulaglutide (TRULICITY) 1.61 WR/6.0AV SOPN Inject 0.75 mg into the skin every Thursday.   . ezetimibe (ZETIA) 10 MG tablet Take 10 mg by mouth daily.  . ferrous sulfate 325 (65 FE) MG tablet TAKE ONE TABLET BY MOUTH ONCE DAILY WITH  BREAKFAST  . furosemide (LASIX) 40 MG tablet Take 1 tablet (40 mg total) by mouth daily. Take 2 tablets (80mg  total) in the morning and 1 tablet (40 mg total) in the evening.  [name] ipratropium-albuterol (DUONEB) 0.5-2.5 (3) MG/3ML SOLN Take 3 mLs by nebulization every 4 (four) hours as needed (for shortness of breath).   [name] levothyroxine (SYNTHROID) 112 MCG tablet Take 1 tablet (112 mcg total) by mouth daily before breakfast.  . metoprolol succinate (TOPROL-XL) 100 MG 24 hr tablet Take 1 tablet (100 mg total) by mouth daily. Take with or  immediately following a meal.  . Multiple Vitamin (MULTIVITAMIN WITH MINERALS) TABS tablet Take 1 tablet by mouth daily.  . Multiple Vitamins-Minerals (PRESERVISION AREDS 2) CAPS Take 1 capsule by mouth 2 (two) times daily.  . prednisoLONE acetate (PRED FORTE) 1 % ophthalmic suspension Place 1 drop into the right eye daily.  . rosuvastatin (CRESTOR) 5 MG tablet Take 5 mg by mouth See admin instructions. Take one tablet (5 mg) by mouth at night on Sunday, Tuesday, Thursday, Saturday  . sodium chloride (MURO 128) 2 % ophthalmic solution Place 1 drop into the right eye daily.  [name] umeclidinium-vilanterol (ANORO ELLIPTA) 62.5-25 MCG/INH AEPB Inhale 1 puff into the lungs daily.  . vitamin C (ASCORBIC ACID) 500 MG tablet Take 2,000 mg by mouth daily.  . [DISCONTINUED] febuxostat (ULORIC) 40 MG tablet Take 40 mg by mouth daily.  . [DISCONTINUED] hydrochlorothiazide (HYDRODIURIL) 12.5 MG tablet Take 12.5 mg by mouth daily.  . [DISCONTINUED] losartan (COZAAR) 25 MG tablet Take 1 tablet (25 mg total) by mouth daily.    Allergies  Allergen Reactions  . Crestor [Rosuvastatin Calcium] Other (See Comments)    Cramps- still taking   . Allopurinol Hives  . Tape Rash    Adhesive tape     SOCIAL HISTORY/FAMILY HISTORY   Social History   Tobacco Use  . Smoking status: Former Smoker    Packs/day: 0.50    Years: 39.00    Pack years: 19.50    Types: Cigarettes    Quit date: 08/28/1993    Years since quitting: 26.0  . Smokeless tobacco: Never Used  Substance Use Topics  . Alcohol use: No    Comment: quit 95  . Drug use: No   Social History   Social History Narrative   Lives with husband and son in [place].  She is currently retired.   Former smoker quit in 1995.    family history includes Hypertension in her mother; Other in her mother. She was adopted.   OBJCTIVE -PE, EKG, labs   Wt Readings from Last 3 Encounters:  09/26/19 240 lb 3.2 oz (109 kg)  09/25/19 244 lb (110.7 kg)   07/28/19 234 lb 1.6 oz (106.2 kg)    Physical Exam: BP 109/62   Pulse 87   Ht 5' 3.5" (1.613 m)   Wt 244 lb (110.7 kg)   SpO2 95%   BMI 42.54 kg/m  Physical Exam  Constitutional: She is oriented to person, place, and time. No distress (No obvious distress, just seems to be somewhat fatigued and more short of breath).  Morbidly obese elderly woman.  Still sitting in wheelchair.  Nontoxic  HENT:  Head: Normocephalic and atraumatic.  Eyes:  Right I frosted over from prior corneal transplant.  Is blind from that eye.  Neck: Hepatojugular reflux and JVD (8-9 cmH2O) present. Carotid bruit is not present.  Cardiovascular: Normal rate, regular rhythm, S1 normal and S2 normal. PMI is not displaced (Unable to palpate). Exam reveals distant heart sounds and decreased pulses (Unable to palpate pedal pulses because of swelling). Exam reveals no gallop and no friction rub.  Murmur (Soft SEM at RUSB 1/6) heard. Pulmonary/Chest: Effort normal and breath sounds normal. No respiratory distress. She has no wheezes. She exhibits tenderness (Focal point tenderness along the midclavicular line on the left side).  Abdominal: Soft. Bowel sounds are normal. She exhibits no distension. There is no abdominal tenderness. There is no rebound.  She does note increased abdominal girth, but unable to assess HSM due to body habitus  Musculoskeletal:        General: Deformity (L Leg is in a brace shin to ankle) and edema (Edema up to the knees.  2-3+) present. Normal range of motion.     Cervical back: Normal range of motion and neck supple.     Comments:  Has difficult unsteady gait  Neurological: She is alert and oriented to person, place, and time.  Skin: Skin is warm and dry. No erythema.  Psychiatric: She has a normal mood and affect. Judgment and thought content normal.  Vitals reviewed.   Adult ECG Report V paced (BiV paced)- rate 87  Recent Labs:  From PCP 07/17/2019- K+ 4.3, Cr 2.0; TC 128, TG 59, HDL  55, LDL 61 Lab Results  Component Value Date   CREATININE 2.08 (H) 09/26/2019   BUN 71 (H) 09/26/2019   NA 137 09/26/2019   K 4.0 09/26/2019   CL 101 09/26/2019   CO2 23 09/26/2019   September 17, 2019 from nephrology  Na+ 140, K+ 5.4, Cl- 100, HCO3- 28, BUN 56, Cr 2.42, Glu 144, Ca2+ 9.4;  ASSESSMENT/PLAN   Problem List Items Addressed This Visit    Left main coronary artery disease (Chronic)    Status post CABG.  Has not had ischemic evaluation in a little while bout, but the last one was negative.  Grafts are patent on cath. Is on beta-blocker.  She is currently on ARB but we need to know her renal function.  Labs noted were not known when I saw her. I have suspicion that we probably will not be able to go to [facility] and may have to convert from ARB to BiDil for afterload reduction.  She is on apixaban therefore not on aspirin.  She is on stable dose of rosuvastatin and tolerating it well.  With worsening EF, if that does not improve back to baseline with biventricular pacing and potentially restoring sinus rhythm, may need to consider right and left heart cath.  Careful decision based on the fact that she has worsening renal  function.      Ischemic cardiomyopathy - Primary (Chronic)    Not sure if her most recent drop in EF is related to coronary disease or arrhythmia.  EF is always down to less than 20% by most recent check. Her previous dry weight had been 206 pounds.  Most recently her stable dry weight is been 236 pounds at home.  I think we can try to set her dry weight to 238 which is only about 2 pounds off what it is now.  I will have her take additional furosemide for the next 2 days (120 mg) with still the 40 mg the afternoon.  Hopefully this will get [place] back down to her dry weight.  She is due to see Dr. Aundra Dubin tomorrow. Likely need to make a decision about ARB/Entresto versus stopping and using BiDil. Suggested home dry weight of 238 pounds for now, but she had  previously been down to 206.      Relevant Orders   EKG 12-Lead (Completed)   Morbid obesity- BMI 48 (Chronic)    Definitely limiting factor.  Makes it very hard to assess her symptoms as she is quite immobilized because of her obesity.  Unfortunately is difficult for her to lose weight on her own because she is unable to exercise.      Chronic combined systolic and diastolic CHF, NYHA class 2 (HCC) (Chronic)    She actually seems pretty stable for me now.  She is now has had her ICD placed and is BiV paced.  Probably still in A. fib underlying however not sure.  Would hope that the biventricular pacing will of helped. She is on carvedilol and now low-dose losartan --> plan had been to convert to [facility], however with uncertain renal function, would not make that move now.  (After seeing her chemistry panel from her nephrologist, would not make the change and may need to consider coming off ARB altogether)  Gentle increase in diuretic to try to set her dry weight at home 238 pounds.      Essential hypertension (Chronic)   Relevant Orders   EKG 12-Lead (Completed)   Permanent atrial fibrillation (HCC) - CHA2DS2-VASc Score 6, on Eliquis (Chronic)    PMH permanent A. fib underlying rhythm.  Now status post BiV ICD and is completely V paced.  Not sure of the effect of the underlying A. fib however restoring atrial kick may be beneficial.      Relevant Orders   EKG 12-Lead (Completed)   S/P CABG x 3- 10/23/13 (Chronic)    Most recent cath showed widely patent grafts.  This was done in response to abnormal stress test.  If EF does not improve with current changes, we may need to consider reevaluation -potentially with right and left heart cath.  However with her worsening renal function may need to be little more careful with consideration of contrast load.       Other Visit Diagnoses    Coronary artery disease involving native coronary artery of native heart without angina pectoris        Relevant Orders   EKG 12-Lead (Completed)      I spent a total of 80minutes with the patient and chart review. >  50% of the time was spent in direct patient consultation.  Additional time spent with chart review (studies, outside notes, etc): 20 Total Time: 50 min   Current medicines are reviewed at length with the patient today.  (+/- concerns) had been taking  lots of pills to help with swelling but were not as effective.   Patient Instructions / Medication Changes & Studies & Tests Ordered   Patient Instructions  .Medication Instructions:   no change with medication at present time other than the below instructions.  will obtain  Last notes from  [facility]  Doctor.   Today and tomorrow - take lasix ( furosemide)   120 mg in the morning  40 mg  In the late afternoon  *If you need a refill on your cardiac medications before your next appointment, please call your pharmacy*  Lab Work: Not needed  Testing/Procedures:  not needed  Follow-Up: At [facility], you and your health needs are our priority.  As part of our continuing mission to provide you with exceptional heart care, we have created designated Provider Care Teams.  These Care Teams include your primary Cardiologist (physician) and Advanced Practice Providers (APPs -  Physician Assistants and Nurse Practitioners) who all work together to provide you with the care you need, when you need it.  Your next appointment:   4 month(s)  The format for your next appointment:   In Person  Provider:   Glenetta Hew, MD  Other Instructions  Sliding scale Lasix: Weigh yourself when you get home, then Daily in the Morning. Your dry weight will be what your scale says on the day you return home.(here is  238 lb lbs.).   If you gain more than 3 pounds from dry weight: Increase the Lasix dosing to  120 mg in the morning and  40 mg in the afternoon until weight returns to baseline dry weight.      Studies Ordered:   Orders  Placed This Encounter  Procedures  . EKG 12-Lead     Glenetta Hew, M.D., M.S. Interventional Cardiologist   Pager # [phone] Phone # [phone] [address] [place], [place] [address]   Thank you for choosing Heartcare at [facility]!!

## 2019-09-26 ENCOUNTER — Ambulatory Visit (HOSPITAL_COMMUNITY)
Admission: RE | Admit: 2019-09-26 | Discharge: 2019-09-26 | Disposition: A | Discharge status: Home / Self Care | Payer: Medicare Other | Source: Ambulatory Visit | Attending: Cardiology | Admitting: Cardiology

## 2019-09-26 ENCOUNTER — Encounter (HOSPITAL_COMMUNITY): Payer: Self-pay | Admitting: Cardiology

## 2019-09-26 ENCOUNTER — Other Ambulatory Visit (HOSPITAL_COMMUNITY): Payer: Self-pay

## 2019-09-26 VITALS — BP 112/70 | HR 84 | Wt 240.2 lb

## 2019-09-26 DIAGNOSIS — I5022 Chronic systolic (congestive) heart failure: Secondary | ICD-10-CM | POA: Insufficient documentation

## 2019-09-26 DIAGNOSIS — I255 Ischemic cardiomyopathy: Secondary | ICD-10-CM | POA: Diagnosis not present

## 2019-09-26 DIAGNOSIS — I2510 Atherosclerotic heart disease of native coronary artery without angina pectoris: Secondary | ICD-10-CM | POA: Insufficient documentation

## 2019-09-26 DIAGNOSIS — Z79899 Other long term (current) drug therapy: Secondary | ICD-10-CM | POA: Diagnosis not present

## 2019-09-26 DIAGNOSIS — I5042 Chronic combined systolic (congestive) and diastolic (congestive) heart failure: Secondary | ICD-10-CM

## 2019-09-26 DIAGNOSIS — Z95810 Presence of automatic (implantable) cardiac defibrillator: Secondary | ICD-10-CM | POA: Insufficient documentation

## 2019-09-26 DIAGNOSIS — E1122 Type 2 diabetes mellitus with diabetic chronic kidney disease: Secondary | ICD-10-CM | POA: Insufficient documentation

## 2019-09-26 DIAGNOSIS — N183 Chronic kidney disease, stage 3 unspecified: Secondary | ICD-10-CM | POA: Insufficient documentation

## 2019-09-26 DIAGNOSIS — Z7982 Long term (current) use of aspirin: Secondary | ICD-10-CM | POA: Insufficient documentation

## 2019-09-26 DIAGNOSIS — I4891 Unspecified atrial fibrillation: Secondary | ICD-10-CM | POA: Diagnosis not present

## 2019-09-26 DIAGNOSIS — J449 Chronic obstructive pulmonary disease, unspecified: Secondary | ICD-10-CM | POA: Diagnosis not present

## 2019-09-26 DIAGNOSIS — Z8249 Family history of ischemic heart disease and other diseases of the circulatory system: Secondary | ICD-10-CM | POA: Insufficient documentation

## 2019-09-26 DIAGNOSIS — Z7901 Long term (current) use of anticoagulants: Secondary | ICD-10-CM | POA: Diagnosis not present

## 2019-09-26 DIAGNOSIS — Z951 Presence of aortocoronary bypass graft: Secondary | ICD-10-CM | POA: Diagnosis not present

## 2019-09-26 DIAGNOSIS — Z87891 Personal history of nicotine dependence: Secondary | ICD-10-CM | POA: Diagnosis not present

## 2019-09-26 LAB — BASIC METABOLIC PANEL
Anion gap: 13 (ref 5–15)
BUN: 71 mg/dL — ABNORMAL HIGH (ref 8–23)
CO2: 23 mmol/L (ref 22–32)
Calcium: 10.1 mg/dL (ref 8.9–10.3)
Chloride: 101 mmol/L (ref 98–111)
Creatinine, Ser: 2.08 mg/dL — ABNORMAL HIGH (ref 0.44–1.00)
GFR calc Af Amer: 26 mL/min — ABNORMAL LOW (ref >60)
GFR calc non Af Amer: 22 mL/min — ABNORMAL LOW (ref >60)
Glucose, Bld: 132 mg/dL — ABNORMAL HIGH (ref 70–99)
Potassium: 4 mmol/L (ref 3.5–5.1)
Sodium: 137 mmol/L (ref 135–145)

## 2019-09-26 LAB — BRAIN NATRIURETIC PEPTIDE: B Natriuretic Peptide: 371.4 pg/mL — ABNORMAL HIGH (ref 0.0–100.0)

## 2019-09-26 MED ORDER — AMIODARONE HCL 200 MG PO TABS: ORAL_TABLET | ORAL | 3 refills | Status: DC

## 2019-09-26 MED ORDER — ISOSORB DINITRATE-HYDRALAZINE 20-37.5 MG PO TABS: 0.5000 | ORAL_TABLET | Freq: Three times a day (TID) | ORAL | 5 refills | Status: DC

## 2019-09-26 NOTE — Patient Instructions (Addendum)
STOP Losartan  START Bidil 1/2 tab three times a day  START Amiodarone 200mg  (1 tab) twice a day for 2 weeks, THEN 200mg  (1 tab) daily after that  Labs today and repeat in 2 weeks We will only contact you if something comes back abnormal or we need to make some changes. Otherwise no news is good news!   Your physician has requested that you have an echocardiogram. Echocardiography is a painless test that uses sound waves to create images of your heart. It provides your doctor with information about the size and shape of your heart and how well your heart's chambers and valves are working. This procedure takes approximately one hour. There are no restrictions for this procedure.  Your physician recommends that you schedule a follow-up appointment in: 2 weeks with the Pharmacy for medication management and 6 weeks for an ECHO and MD visit.  Please call office at [phone] option [phone] if you have any questions or concerns.    Your physician has recommended that you have a Cardioversion (DCCV). Electrical Cardioversion uses a jolt of electricity to your heart either through paddles or wired patches attached to your chest. This is a controlled, usually prescheduled, procedure. Defibrillation is done under light anesthesia in the hospital, and you usually go home the day of the procedure. This is done to get your heart back into a normal rhythm. You are not awake for the procedure. Please see the instruction sheet given to you today. See below for instructions   Dear Elaine [name],  You are scheduled for a Cardioversion on Wednesday, February 24th, 2021 with Dr. Aundra Dubin.  Please arrive at the [facility] (Main Entrance A) at [facility]: [address] [place], [place] [address] at 7 am (1 hour prior to procedure unless lab work is needed; if lab work is needed arrive 1.5 hours ahead)  DIET: Nothing to eat or drink after midnight except a sip of water with medications (see medication  instructions below)  Medication Instructions: Hold ALL medications on the morning of your procedure except for Eliquis  Continue your anticoagulant: Eliquis You will need to continue your anticoagulant after your procedure until   you are told by your provider that it is safe to stop   Labs: In 2 weeks when you come in for your pharmacy appointment  You will need a pre procedure COVID test    WHEN:  Saturday, February 20th, 2021 at [place]: [facility]                 [place] [address]  This is a drive thru testing site, you will remain in your car. Be sure to get in the line FOR PROCEDURES Once you have been swabbed you will need to remain home in quarantine until you return for your procedure.   You must have a responsible person to drive you home and stay in the waiting area during your procedure. Failure to do so could result in cancellation.  Bring your insurance cards.  *Special Note: Every effort is made to have your procedure done on time. Occasionally there are emergencies that occur at the hospital that may cause delays. Please be patient if a delay does occur.

## 2019-09-27 ENCOUNTER — Encounter: Payer: Self-pay | Admitting: Cardiology

## 2019-09-27 NOTE — Assessment & Plan Note (Addendum)
She actually seems pretty stable for me now.  She is now has had her ICD placed and is BiV paced.  Probably still in A. fib underlying however not sure.  Would hope that the biventricular pacing will of helped. She is on carvedilol and now low-dose losartan --> plan had been to convert to [facility], however with uncertain renal function, would not make that move now.  (After seeing her chemistry panel from her nephrologist, would not make the change and may need to consider coming off ARB altogether)  Gentle increase in diuretic to try to set her dry weight at home 238 pounds.

## 2019-09-27 NOTE — H&P (View-Only) (Signed)
PCP: Marton Redwood, MD Cardiology: Dr. Ellyn Hack HF Cardiology: Dr. Aundra Dubin  79 y.o. with history of CAD s/p CABG, COPD, CKD stage 3, and chronic systolic systolic CHF/ischemic cardiomyopathy was referred by Dr. Ellyn Hack for evaluation of CHF.  Patient was admitted in 2/15 with atrial fibrillation/RVR and chest pain.  Cath showed severe 3VD and she had CABG x 3.  Echo in 2/15 showed EF down to 20-25%. In 2018, it appears that she went back into atrial fibrillation and has remained in atrial fibrillation persistently.  DCCV was not attempted.   Most recent echo in 10/20 showed EF < 20% with normal RV.  She had a St Jude CRT-D device implanted in 11/20.  She says that she feels "90%" better with CRT.  She is followed by Dr. Hollie Salk with nephrology.   She is doing better now than she was prior to CRT implantation.  She wears a brace on left lower leg due to ankle problems.  She walks with a walker in the house without dyspnea.  No chest pain.  No lightheadedness/falls.  No palpitations.  No orthopnea/PND.  Creatinine has been higher recently, she thinks it was > 2 at her recent nephrology appt.   ECG (personally reviewed): atrial fibrillation, BiV pacing (personally reviewed).  Labs (10/20): LDL 60 Labs (11/20): K 4.3, creatinine 1.97  PMH: 1. CAD: s/p CABG in 2/15 with LIMA-LAD, SVG-OM2, SVG-PLV.  2. LBBB: Chronic.  3. Atrial fibrillation: Persistent since 2018.  - LA appendage clip with CBGdldkl 4. H/o aspiration PNA 5. Type 2 diabetes 6. COPD 7. CKD stage 3 8. Chronic systolic CHF: Ischemic cardiomyopathy.  She has a St Jude BiV ICD.  - Echo (2/15): EF 20-25%.  - Echo (5/15): EF 40-45% - Echo (10/20): EF <20%, mild LVH, normal RV size and systolic function.  9. CKD: Stage 3.   Social History   Socioeconomic History  . Marital status: Married    Spouse name: Not on file  . Number of children: Not on file  . Years of education: Not on file  . Highest education level: Not on file   Occupational History  . Occupation: retired  Tobacco Use  . Smoking status: Former Smoker    Packs/day: 0.50    Years: 39.00    Pack years: 19.50    Types: Cigarettes    Quit date: 08/28/1993    Years since quitting: 26.0  . Smokeless tobacco: Never Used  Substance and Sexual Activity  . Alcohol use: No    Comment: quit 95  . Drug use: No  . Sexual activity: Not on file  Other Topics Concern  . Not on file  Social History Narrative   Lives with husband and son in [place].  She is currently retired.   Former smoker quit in 1995.   Social Determinants of Health   Financial Resource Strain:   . Difficulty of Paying Living Expenses: Not on file  Food Insecurity:   . Worried About Charity fundraiser in the Last Year: Not on file  . Ran Out of Food in the Last Year: Not on file  Transportation Needs:   . Lack of Transportation (Medical): Not on file  . Lack of Transportation (Non-Medical): Not on file  Physical Activity:   . Days of Exercise per Week: Not on file  . Minutes of Exercise per Session: Not on file  Stress:   . Feeling of Stress : Not on file  Social Connections:   . Frequency of  Communication with Friends and Family: Not on file  . Frequency of Social Gatherings with Friends and Family: Not on file  . Attends Religious Services: Not on file  . Active Member of Clubs or Organizations: Not on file  . Attends Archivist Meetings: Not on file  . Marital Status: Not on file  Intimate Partner Violence:   . Fear of Current or Ex-Partner: Not on file  . Emotionally Abused: Not on file  . Physically Abused: Not on file  . Sexually Abused: Not on file   Family History  Adopted: Yes  Problem Relation Age of Onset  . Other Mother   . Hypertension Mother   . Colon cancer Neg Hx   . Esophageal cancer Neg Hx   . Rectal cancer Neg Hx   . Stomach cancer Neg Hx    Current Outpatient Medications  Medication Sig Dispense Refill  . acetaminophen (TYLENOL)  650 MG CR tablet Take 1,300 mg by mouth 2 (two) times daily.    [name] albuterol (PROAIR HFA) 108 (90 Base) MCG/ACT inhaler Inhale 2 puffs into the lungs every 6 (six) hours as needed for wheezing or shortness of breath.    [name] apixaban (ELIQUIS) 5 MG TABS tablet Take 1 tablet (5 mg total) by mouth 2 (two) times daily. Resume Saturday evening. 60 tablet   . aspirin EC 81 MG tablet Take 1 tablet (81 mg total) by mouth daily.    . AZO-CRANBERRY PO Take 1 tablet by mouth 2 (two) times daily.     . carboxymethylcellulose (REFRESH PLUS) 0.5 % SOLN Place 1 drop into the left eye daily.    . Coenzyme Q10 (COQ-10) 100 MG CAPS Take 100 mg by mouth daily.    . Dulaglutide (TRULICITY) 7.34 KA/7.6OT SOPN Inject 0.75 mg into the skin every Thursday.     . ezetimibe (ZETIA) 10 MG tablet Take 10 mg by mouth daily.    . ferrous sulfate 325 (65 FE) MG tablet TAKE ONE TABLET BY MOUTH ONCE DAILY WITH  BREAKFAST 30 tablet 4  . furosemide (LASIX) 40 MG tablet Take 1 tablet (40 mg total) by mouth daily. Take 2 tablets (80mg  total) in the morning and 1 tablet (40 mg total) in the evening. 90 tablet 2  . ipratropium-albuterol (DUONEB) 0.5-2.5 (3) MG/3ML SOLN Take 3 mLs by nebulization every 4 (four) hours as needed (for shortness of breath).     [name] levothyroxine (SYNTHROID) 112 MCG tablet Take 1 tablet (112 mcg total) by mouth daily before breakfast. 30 tablet 12  . metoprolol succinate (TOPROL-XL) 100 MG 24 hr tablet Take 1 tablet (100 mg total) by mouth daily. Take with or immediately following a meal. 30 tablet 2  . Multiple Vitamin (MULTIVITAMIN WITH MINERALS) TABS tablet Take 1 tablet by mouth daily.    . Multiple Vitamins-Minerals (PRESERVISION AREDS 2) CAPS Take 1 capsule by mouth 2 (two) times daily.    . prednisoLONE acetate (PRED FORTE) 1 % ophthalmic suspension Place 1 drop into the right eye daily.    . rosuvastatin (CRESTOR) 5 MG tablet Take 5 mg by mouth See admin instructions. Take one tablet (5 mg) by mouth at  night on Sunday, Tuesday, Thursday, Saturday    . sodium chloride (MURO 128) 2 % ophthalmic solution Place 1 drop into the right eye daily.    [name] umeclidinium-vilanterol (ANORO ELLIPTA) 62.5-25 MCG/INH AEPB Inhale 1 puff into the lungs daily.    . vitamin C (ASCORBIC ACID) 500 MG tablet Take  2,000 mg by mouth daily.    [name] amiodarone (PACERONE) 200 MG tablet Take 1 tab twice a day for 2 weeks THEN 1 tab daily after that 90 tablet 3  . isosorbide-hydrALAZINE (BIDIL) 20-37.5 MG tablet Take 0.5 tablets by mouth 3 (three) times daily. 45 tablet 5   No current facility-administered medications for this encounter.   BP 112/70   Pulse 84   Wt 109 kg (240 lb 3.2 oz)   SpO2 97%   BMI 41.88 kg/m  General: NAD, obese Neck: No JVD, no thyromegaly or thyroid nodule.  Lungs: Clear to auscultation bilaterally with normal respiratory effort. CV: Nondisplaced PMI.  Heart irregular S1/S2, no S3/S4, no murmur.  No peripheral edema.  No carotid bruit.  Normal pedal pulses.  Abdomen: Soft, nontender, no hepatosplenomegaly, no distention.  Skin: Intact without lesions or rashes.  Neurologic: Alert and oriented x 3.  Psych: Normal affect. Extremities: No clubbing or cyanosis.  HEENT: Normal.   Assessment/Plan: 1. Chronic systolic CHF: Ischemic cardiomyopathy.  St Jude CRT-D device.  Last echo in 10/20 with EF <20%, normal RV. She is not volume overloaded on exam.  NYHA class II-III symptom.  She is improved since CRT.  - Continue Lasix 80 qam/40 qpm.  BMET today.  - Stop losartan with elevated creatinine, I will have her start Bidil 1/2 tab tid.  - Continue Toprol XL 100 mg daily.  - Next step will be to start spironolactone.  - I would like to try to get her back into NSR (see below).  I will arrange for echo at followup in 6 wks.  Hopefully, with NSR and BiV pacing, EF will have improved.  If not, will need to consider RHC/LHC as next step.   2. CAD: S/p CABG in 2015. No chest pain.  - Continue ASA 81 -  Continue Crestor 5 daily.  3. Atrial fibrillation: Persistent, looks like it has gone on on since 2018.  I think 1 trial of getting her back into NSR would be reasonable.   - Start amiodarone 200 mg bid x 2 wks then 200 mg daily.   - If she remains in atrial fibrillation despite DCCV, I will stop amiodarone.  4. CKD: Stage 3.  Creatinine up above 2 at last visit with nephrology.   - BMET today.  - As above, stopping losartan.   Followup with HF pharmacist in 2 wks, see me in 6 wks.   Loralie Champagne 09/27/2019

## 2019-09-27 NOTE — Assessment & Plan Note (Signed)
PMH permanent A. fib underlying rhythm.  Now status post BiV ICD and is completely V paced.  Not sure of the effect of the underlying A. fib however restoring atrial kick may be beneficial.

## 2019-09-27 NOTE — Assessment & Plan Note (Signed)
Definitely limiting factor.  Makes it very hard to assess her symptoms as she is quite immobilized because of her obesity.  Unfortunately is difficult for her to lose weight on her own because she is unable to exercise.

## 2019-09-27 NOTE — Assessment & Plan Note (Signed)
Most recent cath showed widely patent grafts.  This was done in response to abnormal stress test.  If EF does not improve with current changes, we may need to consider reevaluation -potentially with right and left heart cath.  However with her worsening renal function may need to be little more careful with consideration of contrast load.

## 2019-09-27 NOTE — Assessment & Plan Note (Addendum)
Status post CABG.  Has not had ischemic evaluation in a little while bout, but the last one was negative.  Grafts are patent on cath. Is on beta-blocker.  She is currently on ARB but we need to know her renal function.  Labs noted were not known when I saw her. I have suspicion that we probably will not be able to go to [facility] and may have to convert from ARB to BiDil for afterload reduction.  She is on apixaban therefore not on aspirin.  She is on stable dose of rosuvastatin and tolerating it well.  With worsening EF, if that does not improve back to baseline with biventricular pacing and potentially restoring sinus rhythm, may need to consider right and left heart cath.  Careful decision based on the fact that she has worsening renal function.

## 2019-09-27 NOTE — Progress Notes (Signed)
PCP: Marton Redwood, MD Cardiology: Dr. Ellyn Hack HF Cardiology: Dr. Aundra Dubin  79 y.o. with history of CAD s/p CABG, COPD, CKD stage 3, and chronic systolic systolic CHF/ischemic cardiomyopathy was referred by Dr. Ellyn Hack for evaluation of CHF.  Patient was admitted in 2/15 with atrial fibrillation/RVR and chest pain.  Cath showed severe 3VD and she had CABG x 3.  Echo in 2/15 showed EF down to 20-25%. In 2018, it appears that she went back into atrial fibrillation and has remained in atrial fibrillation persistently.  DCCV was not attempted.   Most recent echo in 10/20 showed EF < 20% with normal RV.  She had a St Jude CRT-D device implanted in 11/20.  She says that she feels "90%" better with CRT.  She is followed by Dr. Hollie Salk with nephrology.   She is doing better now than she was prior to CRT implantation.  She wears a brace on left lower leg due to ankle problems.  She walks with a walker in the house without dyspnea.  No chest pain.  No lightheadedness/falls.  No palpitations.  No orthopnea/PND.  Creatinine has been higher recently, she thinks it was > 2 at her recent nephrology appt.   ECG (personally reviewed): atrial fibrillation, BiV pacing (personally reviewed).  Labs (10/20): LDL 60 Labs (11/20): K 4.3, creatinine 1.97  PMH: 1. CAD: s/p CABG in 2/15 with LIMA-LAD, SVG-OM2, SVG-PLV.  2. LBBB: Chronic.  3. Atrial fibrillation: Persistent since 2018.  - LA appendage clip with CBGdldkl 4. H/o aspiration PNA 5. Type 2 diabetes 6. COPD 7. CKD stage 3 8. Chronic systolic CHF: Ischemic cardiomyopathy.  She has a St Jude BiV ICD.  - Echo (2/15): EF 20-25%.  - Echo (5/15): EF 40-45% - Echo (10/20): EF <20%, mild LVH, normal RV size and systolic function.  9. CKD: Stage 3.   Social History   Socioeconomic History  . Marital status: Married    Spouse name: Not on file  . Number of children: Not on file  . Years of education: Not on file  . Highest education level: Not on file   Occupational History  . Occupation: retired  Tobacco Use  . Smoking status: Former Smoker    Packs/day: 0.50    Years: 39.00    Pack years: 19.50    Types: Cigarettes    Quit date: 08/28/1993    Years since quitting: 26.0  . Smokeless tobacco: Never Used  Substance and Sexual Activity  . Alcohol use: No    Comment: quit 95  . Drug use: No  . Sexual activity: Not on file  Other Topics Concern  . Not on file  Social History Narrative   Lives with husband and son in [place].  She is currently retired.   Former smoker quit in 1995.   Social Determinants of Health   Financial Resource Strain:   . Difficulty of Paying Living Expenses: Not on file  Food Insecurity:   . Worried About Charity fundraiser in the Last Year: Not on file  . Ran Out of Food in the Last Year: Not on file  Transportation Needs:   . Lack of Transportation (Medical): Not on file  . Lack of Transportation (Non-Medical): Not on file  Physical Activity:   . Days of Exercise per Week: Not on file  . Minutes of Exercise per Session: Not on file  Stress:   . Feeling of Stress : Not on file  Social Connections:   . Frequency of  Communication with Friends and Family: Not on file  . Frequency of Social Gatherings with Friends and Family: Not on file  . Attends Religious Services: Not on file  . Active Member of Clubs or Organizations: Not on file  . Attends Archivist Meetings: Not on file  . Marital Status: Not on file  Intimate Partner Violence:   . Fear of Current or Ex-Partner: Not on file  . Emotionally Abused: Not on file  . Physically Abused: Not on file  . Sexually Abused: Not on file   Family History  Adopted: Yes  Problem Relation Age of Onset  . Other Mother   . Hypertension Mother   . Colon cancer Neg Hx   . Esophageal cancer Neg Hx   . Rectal cancer Neg Hx   . Stomach cancer Neg Hx    Current Outpatient Medications  Medication Sig Dispense Refill  . acetaminophen (TYLENOL)  650 MG CR tablet Take 1,300 mg by mouth 2 (two) times daily.    [name] albuterol (PROAIR HFA) 108 (90 Base) MCG/ACT inhaler Inhale 2 puffs into the lungs every 6 (six) hours as needed for wheezing or shortness of breath.    [name] apixaban (ELIQUIS) 5 MG TABS tablet Take 1 tablet (5 mg total) by mouth 2 (two) times daily. Resume Saturday evening. 60 tablet   . aspirin EC 81 MG tablet Take 1 tablet (81 mg total) by mouth daily.    . AZO-CRANBERRY PO Take 1 tablet by mouth 2 (two) times daily.     . carboxymethylcellulose (REFRESH PLUS) 0.5 % SOLN Place 1 drop into the left eye daily.    . Coenzyme Q10 (COQ-10) 100 MG CAPS Take 100 mg by mouth daily.    . Dulaglutide (TRULICITY) 6.26 RS/8.5IO SOPN Inject 0.75 mg into the skin every Thursday.     . ezetimibe (ZETIA) 10 MG tablet Take 10 mg by mouth daily.    . ferrous sulfate 325 (65 FE) MG tablet TAKE ONE TABLET BY MOUTH ONCE DAILY WITH  BREAKFAST 30 tablet 4  . furosemide (LASIX) 40 MG tablet Take 1 tablet (40 mg total) by mouth daily. Take 2 tablets (80mg  total) in the morning and 1 tablet (40 mg total) in the evening. 90 tablet 2  . ipratropium-albuterol (DUONEB) 0.5-2.5 (3) MG/3ML SOLN Take 3 mLs by nebulization every 4 (four) hours as needed (for shortness of breath).     [name] levothyroxine (SYNTHROID) 112 MCG tablet Take 1 tablet (112 mcg total) by mouth daily before breakfast. 30 tablet 12  . metoprolol succinate (TOPROL-XL) 100 MG 24 hr tablet Take 1 tablet (100 mg total) by mouth daily. Take with or immediately following a meal. 30 tablet 2  . Multiple Vitamin (MULTIVITAMIN WITH MINERALS) TABS tablet Take 1 tablet by mouth daily.    . Multiple Vitamins-Minerals (PRESERVISION AREDS 2) CAPS Take 1 capsule by mouth 2 (two) times daily.    . prednisoLONE acetate (PRED FORTE) 1 % ophthalmic suspension Place 1 drop into the right eye daily.    . rosuvastatin (CRESTOR) 5 MG tablet Take 5 mg by mouth See admin instructions. Take one tablet (5 mg) by mouth at  night on Sunday, Tuesday, Thursday, Saturday    . sodium chloride (MURO 128) 2 % ophthalmic solution Place 1 drop into the right eye daily.    [name] umeclidinium-vilanterol (ANORO ELLIPTA) 62.5-25 MCG/INH AEPB Inhale 1 puff into the lungs daily.    . vitamin C (ASCORBIC ACID) 500 MG tablet Take  2,000 mg by mouth daily.    [name] amiodarone (PACERONE) 200 MG tablet Take 1 tab twice a day for 2 weeks THEN 1 tab daily after that 90 tablet 3  . isosorbide-hydrALAZINE (BIDIL) 20-37.5 MG tablet Take 0.5 tablets by mouth 3 (three) times daily. 45 tablet 5   No current facility-administered medications for this encounter.   BP 112/70   Pulse 84   Wt 109 kg (240 lb 3.2 oz)   SpO2 97%   BMI 41.88 kg/m  General: NAD, obese Neck: No JVD, no thyromegaly or thyroid nodule.  Lungs: Clear to auscultation bilaterally with normal respiratory effort. CV: Nondisplaced PMI.  Heart irregular S1/S2, no S3/S4, no murmur.  No peripheral edema.  No carotid bruit.  Normal pedal pulses.  Abdomen: Soft, nontender, no hepatosplenomegaly, no distention.  Skin: Intact without lesions or rashes.  Neurologic: Alert and oriented x 3.  Psych: Normal affect. Extremities: No clubbing or cyanosis.  HEENT: Normal.   Assessment/Plan: 1. Chronic systolic CHF: Ischemic cardiomyopathy.  St Jude CRT-D device.  Last echo in 10/20 with EF <20%, normal RV. She is not volume overloaded on exam.  NYHA class II-III symptom.  She is improved since CRT.  - Continue Lasix 80 qam/40 qpm.  BMET today.  - Stop losartan with elevated creatinine, I will have her start Bidil 1/2 tab tid.  - Continue Toprol XL 100 mg daily.  - Next step will be to start spironolactone.  - I would like to try to get her back into NSR (see below).  I will arrange for echo at followup in 6 wks.  Hopefully, with NSR and BiV pacing, EF will have improved.  If not, will need to consider RHC/LHC as next step.   2. CAD: S/p CABG in 2015. No chest pain.  - Continue ASA 81 -  Continue Crestor 5 daily.  3. Atrial fibrillation: Persistent, looks like it has gone on on since 2018.  I think 1 trial of getting her back into NSR would be reasonable.   - Start amiodarone 200 mg bid x 2 wks then 200 mg daily.   - If she remains in atrial fibrillation despite DCCV, I will stop amiodarone.  4. CKD: Stage 3.  Creatinine up above 2 at last visit with nephrology.   - BMET today.  - As above, stopping losartan.   Followup with HF pharmacist in 2 wks, see me in 6 wks.   Loralie Champagne 09/27/2019

## 2019-09-27 NOTE — Assessment & Plan Note (Addendum)
Not sure if her most recent drop in EF is related to coronary disease or arrhythmia.  EF is always down to less than 20% by most recent check. Her previous dry weight had been 206 pounds.  Most recently her stable dry weight is been 236 pounds at home.  I think we can try to set her dry weight to 238 which is only about 2 pounds off what it is now.  I will have her take additional furosemide for the next 2 days (120 mg) with still the 40 mg the afternoon.  Hopefully this will get [place] back down to her dry weight.  She is due to see Dr. Aundra Dubin tomorrow. Likely need to make a decision about ARB/Entresto versus stopping and using BiDil. Suggested home dry weight of 238 pounds for now, but she had previously been down to 206.

## 2019-09-29 ENCOUNTER — Other Ambulatory Visit: Payer: Self-pay

## 2019-09-29 ENCOUNTER — Ambulatory Visit
Admission: RE | Admit: 2019-09-29 | Discharge: 2019-09-29 | Disposition: A | Discharge status: Home / Self Care | Payer: Medicare Other | Source: Ambulatory Visit | Attending: Internal Medicine | Admitting: Internal Medicine

## 2019-09-29 DIAGNOSIS — Z1231 Encounter for screening mammogram for malignant neoplasm of breast: Secondary | ICD-10-CM

## 2019-10-16 ENCOUNTER — Other Ambulatory Visit (HOSPITAL_COMMUNITY): Payer: Medicare Other

## 2019-10-16 ENCOUNTER — Inpatient Hospital Stay (HOSPITAL_COMMUNITY): Admission: RE | Admit: 2019-10-16 | Payer: Medicare Other | Source: Ambulatory Visit

## 2019-10-16 NOTE — Progress Notes (Signed)
PCP: Marton Redwood, MD Cardiology: Dr. Ellyn Hack HF Cardiology: Dr. Aundra Dubin  HPI:  79 y.o. with history of CAD s/p CABG, COPD, CKD stage 3, and chronic systolic systolic CHF/ischemic cardiomyopathy was referred by Dr. Ellyn Hack for evaluation of CHF.  Patient was admitted in 2/15 with atrial fibrillation/RVR and chest pain.  Cath showed severe 3VD and she had CABG x 3.  Echo in 2/15 showed EF down to 20-25%. In 2018, it appears that she went back into atrial fibrillation and has remained in atrial fibrillation persistently.  DCCV was not attempted.   Most recent echo in 10/20 showed EF < 20% with normal RV.  She had a St Jude CRT-D device implanted in 11/20.  She says that she feels "90%" better with CRT.  She is followed by Dr. Hollie Salk with nephrology.   Recently presented to HF Clinic with Dr. Aundra Dubin on 09/26/19. She reported doing better than she was prior to CRT implantation.  She wears a brace on left lower leg due to ankle problems.  She reported walking with a walker in the house without dyspnea.  No chest pain.  No lightheadedness/falls.  No palpitations.  No orthopnea/PND.  Creatinine had been higher recently, she thinks it was > 2 at her recent nephrology appt.   Today she returns to HF clinic for pharmacist medication titration. At last visit with MD, losartan was discontinued due to elevated Scr and Bidil 1/2 tablet TID was initiated. She reports feeling well "95% of the time". She did complain about chest pain last night that felt like a pinch - she then mentioned she thought it might be her ICD shocking her. ICD interrogated in clinic today - no shocks or arrhythmias were recorded. Results reviewed with Dr. Aundra Dubin in clinic. No dizziness or lightheadedness. Stated she can feel some palpitations at night, but not always. She will get DCCV this week. Her weight at home is stable at 231-233 lbs. She does weigh herself daily. Her weight in clinic was down 4 lbs since last visit. No PND/orthopnea. She  notes her volume status is much better since fluid restricting and decreasing the amount of salt intake in her diet.   HF Medications: Lasix 80 qam/40 qpm Metoprolol succinate 100 mg daily Bidil 20-37.5 mg 1/2 tablet TID  Has the patient been experiencing any side effects to the medications prescribed?  no  Does the patient have any problems obtaining medications due to transportation or finances?   No - has Medicare Part D with [employer]  Understanding of regimen: fair Understanding of indications: fair Potential of compliance: good Patient understands to avoid NSAIDs. Patient understands to avoid decongestants.    Pertinent Lab Values: . Serum creatinine 2.99, BUN 66, Potassium 4.5, Sodium 137, Magnesium 2.8  Vital Signs: . Weight: 236.2 lbs (last clinic weight: 240.2) . Blood pressure: 120/74  . Heart rate: 78   Assessment: 1. Chronic systolic CHF: Ischemic cardiomyopathy.  St Jude CRT-D device.  Last echo in 10/20 with EF <20%, normal RV.  - NYHA class II-III symptoms.  She has improved since CRT. Euvolemic-dry on exam. - Labs: Scr increased from 2.08 to 2.99, K 4.5 -Vitals: BP 120/74, HR 78 -After speaking with Dr. Aundra Dubin, will hold furosemide for 2 days, then decrease to furosemide 80 mg daily. - Continue metoprolol succinate 100 mg daily.   - Losartan previously discontinued 08/2019 with elevated creatinine. - Increase Bidil 20-37.5 mg to 1 tablet TID 2. CAD: S/p CABG in 2015. No chest pain.  -  Continue ASA 81 - Continue Crestor 5 daily.  3. Atrial fibrillation: Persistent, looks like it has gone on on since 2018.  I think 1 trial of getting her back into NSR would be reasonable.   - Continue amiodarone 200 mg daily.   - If she remains in atrial fibrillation despite DCCV, will stop amiodarone.  4. CKD: Stage 3.  Creatinine up above 2 at last visit with nephrology.   - BMET today.     Plan: 1) Medication changes: Based on clinical presentation, vital signs and  recent labs will Hold furosemide for 2 days, then decrease furosemide to 80 mg daily. Increase Bidil to 1 tablet TID. 2) Follow-up: 3 weeks with Dr. Rush Farmer, PharmD, BCPS, [facility], CPP Heart Failure Clinic Pharmacist [phone]

## 2019-10-18 ENCOUNTER — Other Ambulatory Visit (HOSPITAL_COMMUNITY)
Admission: RE | Admit: 2019-10-18 | Discharge: 2019-10-18 | Disposition: A | Discharge status: Home / Self Care | Payer: Medicare Other | Source: Ambulatory Visit | Attending: Cardiology | Admitting: Cardiology

## 2019-10-18 DIAGNOSIS — Z01812 Encounter for preprocedural laboratory examination: Secondary | ICD-10-CM | POA: Diagnosis present

## 2019-10-18 DIAGNOSIS — Z20822 Contact with and (suspected) exposure to covid-19: Secondary | ICD-10-CM | POA: Diagnosis not present

## 2019-10-18 LAB — SARS CORONAVIRUS 2 (TAT 6-24 HRS): SARS Coronavirus 2: NEGATIVE

## 2019-10-20 ENCOUNTER — Other Ambulatory Visit: Payer: Self-pay

## 2019-10-20 ENCOUNTER — Ambulatory Visit (HOSPITAL_COMMUNITY)
Admission: RE | Admit: 2019-10-20 | Discharge: 2019-10-20 | Disposition: A | Discharge status: Home / Self Care | Payer: Medicare Other | Source: Ambulatory Visit | Attending: Cardiology | Admitting: Cardiology

## 2019-10-20 VITALS — BP 120/74 | HR 78 | Wt 236.2 lb

## 2019-10-20 DIAGNOSIS — I255 Ischemic cardiomyopathy: Secondary | ICD-10-CM | POA: Diagnosis not present

## 2019-10-20 DIAGNOSIS — J449 Chronic obstructive pulmonary disease, unspecified: Secondary | ICD-10-CM | POA: Diagnosis not present

## 2019-10-20 DIAGNOSIS — Z79899 Other long term (current) drug therapy: Secondary | ICD-10-CM | POA: Insufficient documentation

## 2019-10-20 DIAGNOSIS — N183 Chronic kidney disease, stage 3 unspecified: Secondary | ICD-10-CM | POA: Diagnosis not present

## 2019-10-20 DIAGNOSIS — I2510 Atherosclerotic heart disease of native coronary artery without angina pectoris: Secondary | ICD-10-CM | POA: Insufficient documentation

## 2019-10-20 DIAGNOSIS — I5022 Chronic systolic (congestive) heart failure: Secondary | ICD-10-CM | POA: Diagnosis not present

## 2019-10-20 DIAGNOSIS — I4819 Other persistent atrial fibrillation: Secondary | ICD-10-CM | POA: Diagnosis not present

## 2019-10-20 DIAGNOSIS — Z951 Presence of aortocoronary bypass graft: Secondary | ICD-10-CM | POA: Insufficient documentation

## 2019-10-20 DIAGNOSIS — Z95810 Presence of automatic (implantable) cardiac defibrillator: Secondary | ICD-10-CM | POA: Diagnosis not present

## 2019-10-20 LAB — CBC
HCT: 40.4 % (ref 36.0–46.0)
Hemoglobin: 12.8 g/dL (ref 12.0–15.0)
MCH: 28.6 pg (ref 26.0–34.0)
MCHC: 31.7 g/dL (ref 30.0–36.0)
MCV: 90.2 fL (ref 80.0–100.0)
Platelets: 211 10*3/uL (ref 150–400)
RBC: 4.48 MIL/uL (ref 3.87–5.11)
RDW: 15.9 % — ABNORMAL HIGH (ref 11.5–15.5)
WBC: 6.3 10*3/uL (ref 4.0–10.5)
nRBC: 0 % (ref 0.0–0.2)

## 2019-10-20 LAB — BASIC METABOLIC PANEL
Anion gap: 14 (ref 5–15)
BUN: 66 mg/dL — ABNORMAL HIGH (ref 8–23)
CO2: 26 mmol/L (ref 22–32)
Calcium: 10.1 mg/dL (ref 8.9–10.3)
Chloride: 97 mmol/L — ABNORMAL LOW (ref 98–111)
Creatinine, Ser: 2.99 mg/dL — ABNORMAL HIGH (ref 0.44–1.00)
GFR calc Af Amer: 17 mL/min — ABNORMAL LOW (ref >60)
GFR calc non Af Amer: 14 mL/min — ABNORMAL LOW (ref >60)
Glucose, Bld: 124 mg/dL — ABNORMAL HIGH (ref 70–99)
Potassium: 4.5 mmol/L (ref 3.5–5.1)
Sodium: 137 mmol/L (ref 135–145)

## 2019-10-20 LAB — PROTIME-INR
INR: 1.7 — ABNORMAL HIGH (ref 0.8–1.2)
Prothrombin Time: 20.1 seconds — ABNORMAL HIGH (ref 11.4–15.2)

## 2019-10-20 LAB — MAGNESIUM: Magnesium: 2.8 mg/dL — ABNORMAL HIGH (ref 1.7–2.4)

## 2019-10-20 MED ORDER — FUROSEMIDE 40 MG PO TABS: ORAL_TABLET | ORAL | 2 refills | Status: DC

## 2019-10-20 MED ORDER — ISOSORB DINITRATE-HYDRALAZINE 20-37.5 MG PO TABS: 1.0000 | ORAL_TABLET | Freq: Three times a day (TID) | ORAL | 5 refills | Status: DC

## 2019-10-20 NOTE — Patient Instructions (Signed)
It was a pleasure seeing you today!  MEDICATIONS: -No medication changes today until we see your labs. -Call if you have questions about your medications.  LABS: -We will call you if your labs need attention.  NEXT APPOINTMENT: Return to clinic in 1 month with Dr. Aundra Dubin.  In general, to take care of your heart failure: -Limit your fluid intake to 2 Liters (half-gallon) per day.   -Limit your salt intake to ideally 2-3 grams (2000-3000 mg) per day. -Weigh yourself daily and record, and bring that "weight diary" to your next appointment.  (Weight gain of 2-3 pounds in 1 day typically means fluid weight.) -The medications for your heart are to help your heart and help you live longer.   -Please contact us before stopping any of your heart medications.  Call the clinic at [phone] with questions or to reschedule future appointments.

## 2019-10-21 NOTE — Anesthesia Preprocedure Evaluation (Addendum)
Anesthesia Evaluation  Patient identified by MRN, date of birth, ID band Patient awake    Reviewed: Allergy & Precautions, NPO status , Patient's Chart, lab work & pertinent test results  Airway Mallampati: II  TM Distance: >3 FB Neck ROM: Full    Dental no notable dental hx. (+) Edentulous Upper, Poor Dentition, Dental Advisory Given,    Pulmonary COPD, former smoker,    Pulmonary exam normal breath sounds clear to auscultation       Cardiovascular hypertension, + CAD, + Past MI and +CHF  Normal cardiovascular exam+ dysrhythmias Atrial Fibrillation + Cardiac Defibrillator  Rhythm:Regular Rate:Normal  10/20 echo  1. Left ventricular ejection fraction, by visual estimation, is <20%. The  left ventricle has severely decreased function. There is mildly increased  left ventricular hypertrophy.   Neuro/Psych negative neurological ROS  negative psych ROS   GI/Hepatic   Endo/Other  diabetes, Type 2Hypothyroidism   Renal/GU CRFRenal diseaseK+ 4.5 Cr 2.99     Musculoskeletal negative musculoskeletal ROS (+)   Abdominal (+) + obese,   Peds  Hematology hgb 12.8   Anesthesia Other Findings   Reproductive/Obstetrics negative OB ROS                            Anesthesia Physical Anesthesia Plan  ASA: IV  Anesthesia Plan: General   Post-op Pain Management:    Induction: Intravenous  PONV Risk Score and Plan: Treatment may vary due to age or medical condition  Airway Management Planned: Nasal Cannula and Natural Airway  Additional Equipment: None  Intra-op Plan:   Post-operative Plan:   Informed Consent: I have reviewed the patients History and Physical, chart, labs and discussed the procedure including the risks, benefits and alternatives for the proposed anesthesia with the patient or authorized representative who has indicated his/her understanding and acceptance.     Dental advisory  given  Plan Discussed with:   Anesthesia Plan Comments:        Anesthesia Quick Evaluation

## 2019-10-22 ENCOUNTER — Ambulatory Visit (HOSPITAL_COMMUNITY)
Admission: RE | Admit: 2019-10-22 | Discharge: 2019-10-22 | Disposition: A | Discharge status: Home / Self Care | Payer: Medicare Other | Attending: Cardiology | Admitting: Cardiology

## 2019-10-22 ENCOUNTER — Ambulatory Visit (HOSPITAL_COMMUNITY): Payer: Medicare Other | Admitting: Anesthesiology

## 2019-10-22 ENCOUNTER — Other Ambulatory Visit: Payer: Self-pay

## 2019-10-22 ENCOUNTER — Encounter (HOSPITAL_COMMUNITY)
Admission: RE | Disposition: A | Discharge status: Home / Self Care | Payer: Self-pay | Source: Home / Self Care | Attending: Cardiology

## 2019-10-22 DIAGNOSIS — E1122 Type 2 diabetes mellitus with diabetic chronic kidney disease: Secondary | ICD-10-CM | POA: Insufficient documentation

## 2019-10-22 DIAGNOSIS — E669 Obesity, unspecified: Secondary | ICD-10-CM | POA: Diagnosis not present

## 2019-10-22 DIAGNOSIS — N183 Chronic kidney disease, stage 3 unspecified: Secondary | ICD-10-CM | POA: Diagnosis not present

## 2019-10-22 DIAGNOSIS — Z6841 Body Mass Index (BMI) 40.0 and over, adult: Secondary | ICD-10-CM | POA: Diagnosis not present

## 2019-10-22 DIAGNOSIS — Z87891 Personal history of nicotine dependence: Secondary | ICD-10-CM | POA: Diagnosis not present

## 2019-10-22 DIAGNOSIS — I4891 Unspecified atrial fibrillation: Secondary | ICD-10-CM

## 2019-10-22 DIAGNOSIS — Z79899 Other long term (current) drug therapy: Secondary | ICD-10-CM | POA: Diagnosis not present

## 2019-10-22 DIAGNOSIS — Z7901 Long term (current) use of anticoagulants: Secondary | ICD-10-CM | POA: Diagnosis not present

## 2019-10-22 DIAGNOSIS — I5022 Chronic systolic (congestive) heart failure: Secondary | ICD-10-CM | POA: Diagnosis not present

## 2019-10-22 DIAGNOSIS — Z7982 Long term (current) use of aspirin: Secondary | ICD-10-CM | POA: Insufficient documentation

## 2019-10-22 DIAGNOSIS — Z951 Presence of aortocoronary bypass graft: Secondary | ICD-10-CM | POA: Diagnosis not present

## 2019-10-22 DIAGNOSIS — I4819 Other persistent atrial fibrillation: Secondary | ICD-10-CM | POA: Insufficient documentation

## 2019-10-22 DIAGNOSIS — Z95810 Presence of automatic (implantable) cardiac defibrillator: Secondary | ICD-10-CM | POA: Insufficient documentation

## 2019-10-22 DIAGNOSIS — Z8249 Family history of ischemic heart disease and other diseases of the circulatory system: Secondary | ICD-10-CM | POA: Diagnosis not present

## 2019-10-22 DIAGNOSIS — Z79890 Hormone replacement therapy (postmenopausal): Secondary | ICD-10-CM | POA: Insufficient documentation

## 2019-10-22 DIAGNOSIS — J449 Chronic obstructive pulmonary disease, unspecified: Secondary | ICD-10-CM | POA: Diagnosis not present

## 2019-10-22 DIAGNOSIS — I2510 Atherosclerotic heart disease of native coronary artery without angina pectoris: Secondary | ICD-10-CM | POA: Insufficient documentation

## 2019-10-22 DIAGNOSIS — Z794 Long term (current) use of insulin: Secondary | ICD-10-CM | POA: Diagnosis not present

## 2019-10-22 DIAGNOSIS — I255 Ischemic cardiomyopathy: Secondary | ICD-10-CM | POA: Insufficient documentation

## 2019-10-22 HISTORY — PX: CARDIOVERSION: SHX1299

## 2019-10-22 LAB — GLUCOSE, CAPILLARY: Glucose-Capillary: 101 mg/dL — ABNORMAL HIGH (ref 70–99)

## 2019-10-22 SURGERY — CARDIOVERSION
Anesthesia: General

## 2019-10-22 MED ORDER — SODIUM CHLORIDE 0.9 % IV SOLN
INTRAVENOUS | Status: AC | PRN
  Administered 2019-10-22: 500 mL via INTRAVENOUS

## 2019-10-22 MED ORDER — PROPOFOL 10 MG/ML IV BOLUS
INTRAVENOUS | Status: DC | PRN
  Administered 2019-10-22: 50 mg via INTRAVENOUS

## 2019-10-22 MED ORDER — LIDOCAINE 2% (20 MG/ML) 5 ML SYRINGE
INTRAMUSCULAR | Status: DC | PRN
  Administered 2019-10-22: 50 mg via INTRAVENOUS

## 2019-10-22 NOTE — Anesthesia Procedure Notes (Signed)
Date/Time: 10/22/2019 8:58 AM Performed by: Trinna Post., CRNA Pre-anesthesia Checklist: Patient identified, Emergency Drugs available, Suction available, Patient being monitored and Timeout performed Patient Re-evaluated:Patient Re-evaluated prior to induction Oxygen Delivery Method: Ambu bag Preoxygenation: Pre-oxygenation with 100% oxygen Induction Type: IV induction Placement Confirmation: positive ETCO2

## 2019-10-22 NOTE — Discharge Instructions (Signed)
Electrical Cardioversion Electrical cardioversion is the delivery of a jolt of electricity to restore a normal rhythm to the heart. A rhythm that is too fast or is not regular keeps the heart from pumping well. In this procedure, sticky patches or metal paddles are placed on the chest to deliver electricity to the heart from a device. This procedure may be done in an emergency if:  There is low or no blood pressure as a result of the heart rhythm.  Normal rhythm must be restored as fast as possible to protect the brain and heart from further damage.  It may save a life. This may also be a scheduled procedure for irregular or fast heart rhythms that are not immediately life-threatening. Tell a health care provider about:  Any allergies you have.  All medicines you are taking, including vitamins, herbs, eye drops, creams, and over-the-counter medicines.  Any problems you or family members have had with anesthetic medicines.  Any blood disorders you have.  Any surgeries you have had.  Any medical conditions you have.  Whether you are pregnant or may be pregnant. What are the risks? Generally, this is a safe procedure. However, problems may occur, including:  Allergic reactions to medicines.  A blood clot that breaks free and travels to other parts of your body.  The possible return of an abnormal heart rhythm within hours or days after the procedure.  Your heart stopping (cardiac arrest). This is rare. What happens before the procedure? Medicines  Your health care provider may have you start taking: ? Blood-thinning medicines (anticoagulants) so your blood does not clot as easily. ? Medicines to help stabilize your heart rate and rhythm.  Ask your health care provider about: ? Changing or stopping your regular medicines. This is especially important if you are taking diabetes medicines or blood thinners. ? Taking medicines such as aspirin and ibuprofen. These medicines can  thin your blood. Do not take these medicines unless your health care provider tells you to take them. ? Taking over-the-counter medicines, vitamins, herbs, and supplements. General instructions  Follow instructions from your health care provider about eating or drinking restrictions.  Plan to have someone take you home from the hospital or clinic.  If you will be going home right after the procedure, plan to have someone with you for 24 hours.  Ask your health care provider what steps will be taken to help prevent infection. These may include washing your skin with a germ-killing soap. What happens during the procedure?   An IV will be inserted into one of your veins.  Sticky patches (electrodes) or metal paddles may be placed on your chest.  You will be given a medicine to help you relax (sedative).  An electrical shock will be delivered. The procedure may vary among health care providers and hospitals. What can I expect after the procedure?  Your blood pressure, heart rate, breathing rate, and blood oxygen level will be monitored until you leave the hospital or clinic.  Your heart rhythm will be watched to make sure it does not change.  You may have some redness on the skin where the shocks were given. Follow these instructions at home:  Do not drive for 24 hours if you were given a sedative during your procedure.  Take over-the-counter and prescription medicines only as told by your health care provider.  Ask your health care provider how to check your pulse. Check it often.  Rest for 48 hours after the procedure or   as told by your health care provider.  Avoid or limit your caffeine use as told by your health care provider.  Keep all follow-up visits as told by your health care provider. This is important. Contact a health care provider if:  You feel like your heart is beating too quickly or your pulse is not regular.  You have a serious muscle cramp that does not go  away. Get help right away if:  You have discomfort in your chest.  You are dizzy or you feel faint.  You have trouble breathing or you are short of breath.  Your speech is slurred.  You have trouble moving an arm or leg on one side of your body.  Your fingers or toes turn cold or blue. Summary  Electrical cardioversion is the delivery of a jolt of electricity to restore a normal rhythm to the heart.  This procedure may be done right away in an emergency or may be a scheduled procedure if the condition is not an emergency.  Generally, this is a safe procedure.  After the procedure, check your pulse often as told by your health care provider. This information is not intended to replace advice given to you by your health care provider. Make sure you discuss any questions you have with your health care provider. Document Revised: 03/17/2019 Document Reviewed: 03/17/2019 Elsevier Patient Education  2020 [employer]  

## 2019-10-22 NOTE — Procedures (Signed)
Electrical Cardioversion Procedure Note Elaine [name] [id number] [birth year]  Procedure: Electrical Cardioversion Indications:  Atrial Fibrillation  Procedure Details Consent: Risks of procedure as well as the alternatives and risks of each were explained to the (patient/caregiver).  Consent for procedure obtained. Time Out: Verified patient identification, verified procedure, site/side was marked, verified correct patient position, special equipment/implants available, medications/allergies/relevent history reviewed, required imaging and test results available.  Performed  Patient placed on cardiac monitor, pulse oximetry, supplemental oxygen as necessary.  Sedation given: Propofol per anesthesiology Pacer pads placed anterior and posterior chest.  Cardioverted 3 times, the 2nd and 3rd times with sternal pressure.  Cardioverted at [place].  Evaluation Findings: Post procedure EKG shows: Atrial Fibrillation Complications: None Patient did tolerate procedure well.  She remains in atrial fibrillation. Stop amiodarone.    Loralie Champagne 10/22/2019, 9:10 AM

## 2019-10-22 NOTE — Interval H&P Note (Signed)
History and Physical Interval Note:  10/22/2019 8:56 AM  Elaine [name]  has presented today for surgery, with the diagnosis of A-FIB.  The various methods of treatment have been discussed with the patient and family. After consideration of risks, benefits and other options for treatment, the patient has consented to  Procedure(s): CARDIOVERSION (N/A) as a surgical intervention.  The patient's history has been reviewed, patient examined, no change in status, stable for surgery.  I have reviewed the patient's chart and labs.  Questions were answered to the patient's satisfaction.     Elaine [name] [employer]

## 2019-10-22 NOTE — Transfer of Care (Signed)
Immediate Anesthesia Transfer of Care Note  Patient: Elaine [name]  Procedure(s) Performed: CARDIOVERSION (N/A )  Patient Location: PACU and Endoscopy Unit  Anesthesia Type:General  Level of Consciousness: drowsy  Airway & Oxygen Therapy: Patient Spontanous Breathing  Post-op Assessment: Report given to RN and Post -op Vital signs reviewed and stable  Post vital signs: Reviewed and stable  Last Vitals:  Vitals Value Taken Time  BP    Temp    Pulse    Resp    SpO2      Last Pain:  Vitals:   10/22/19 0736  TempSrc: Oral         Complications: No apparent anesthesia complications

## 2019-10-22 NOTE — Anesthesia Postprocedure Evaluation (Signed)
Anesthesia Post Note  Patient: Elaine [name]  Procedure(s) Performed: CARDIOVERSION (N/A )     Patient location during evaluation: PACU Anesthesia Type: General Level of consciousness: awake and alert Pain management: pain level controlled Vital Signs Assessment: post-procedure vital signs reviewed and stable Respiratory status: spontaneous breathing, nonlabored ventilation, respiratory function stable and patient connected to nasal cannula oxygen Cardiovascular status: blood pressure returned to baseline and stable Postop Assessment: no apparent nausea or vomiting Anesthetic complications: no    Last Vitals:  Vitals:   10/22/19 0913 10/22/19 0923  BP: (!) 108/52 105/62  Pulse: 81 83  Resp: 12 17  Temp: 36.7 C   SpO2: 100% 100%    Last Pain:  Vitals:   10/22/19 0923  TempSrc:   PainSc: 0-No pain                 Barnet [place]

## 2019-10-27 ENCOUNTER — Encounter: Payer: Self-pay | Admitting: *Deleted

## 2019-10-27 ENCOUNTER — Encounter (INDEPENDENT_AMBULATORY_CARE_PROVIDER_SITE_OTHER): Payer: Self-pay

## 2019-10-27 ENCOUNTER — Ambulatory Visit (INDEPENDENT_AMBULATORY_CARE_PROVIDER_SITE_OTHER): Payer: Medicare Other | Admitting: Cardiology

## 2019-10-27 ENCOUNTER — Encounter: Payer: Self-pay | Admitting: Cardiology

## 2019-10-27 ENCOUNTER — Other Ambulatory Visit: Payer: Self-pay

## 2019-10-27 VITALS — BP 112/58 | HR 86 | Ht 62.0 in | Wt 231.5 lb

## 2019-10-27 DIAGNOSIS — I255 Ischemic cardiomyopathy: Secondary | ICD-10-CM

## 2019-10-27 LAB — CUP PACEART INCLINIC DEVICE CHECK
Battery Remaining Longevity: 69 mo
Brady Statistic RA Percent Paced: 0 %
Brady Statistic RV Percent Paced: 90 %
Date Time Interrogation Session: 20210301143500
HighPow Impedance: 60.75 Ohm
Implantable Lead Implant Date: 20201130
Implantable Lead Implant Date: 20201130
Implantable Lead Location: 753858
Implantable Lead Location: 753860
Implantable Pulse Generator Implant Date: 20201130
Lead Channel Impedance Value: 487.5 Ohm
Lead Channel Impedance Value: 500 Ohm
Lead Channel Pacing Threshold Amplitude: 0.5 V
Lead Channel Pacing Threshold Amplitude: 0.5 V
Lead Channel Pacing Threshold Pulse Width: 0.5 ms
Lead Channel Pacing Threshold Pulse Width: 1 ms
Lead Channel Sensing Intrinsic Amplitude: 12 mV
Lead Channel Setting Pacing Amplitude: 2.5 V
Lead Channel Setting Pacing Amplitude: 2.5 V
Lead Channel Setting Pacing Pulse Width: 0.5 ms
Lead Channel Setting Pacing Pulse Width: 1 ms
Lead Channel Setting Sensing Sensitivity: 0.5 mV
Pulse Gen Serial Number: 810000193

## 2019-10-27 NOTE — Progress Notes (Signed)
Electrophysiology Office Note   Date:  10/27/2019   ID:  Elaine [name], DOB August 19, 1941, MRN [id number]  PCP:  Marton Redwood, MD  Cardiologist:  Ellyn Hack Primary Electrophysiologist:  Elesia Pemberton Meredith Leeds, MD    Chief Complaint: CHF   History of Present Illness: Elaine [name] is a 79 y.o. female who is being seen today for the evaluation of CHF at the request of Marton Redwood, MD. Presenting today for electrophysiology evaluation.  She has a history of CHF status post CABG with left atrial appendage clipping, ischemic cardiomyopathy, persistent atrial fibrillation, hypertension, hyperlipidemia, type 2 diabetes, hypothyroidism, and stage III CKD.  She was hospitalized 06/12/2019 with decompensated heart failure.  Echo showed an ejection fraction less than 20%.  She was also in atrial fibrillation with RVR.  It was initially thought that her heart failure is due to her rapid atrial fibrillation and her left bundle branch block.  She is now status post [place] CRT D implanted 07/28/2019.  Today, denies symptoms of palpitations, chest pain, shortness of breath, orthopnea, PND, lower extremity edema, claudication, dizziness, presyncope, syncope, bleeding, or neurologic sequela. The patient is tolerating medications without difficulties.    Past Medical History:  Diagnosis Date  . Acute myocardial infarction of anterior wall (HCC) 10/20/2013   Afib, LBBB  . Acute pulmonary edema with congestive heart failure ([place]) 10/20/2013   Resolved  . Arthritis   . Atrial fibrillation ([place]) 10/20/2013   On Warfarin  . CAD, multiple vessel 10/20/2013   LM, LAD & RCA --> CABG; MYOVIEW 12/'15: Intermediate Risk would large anterior, anteroapical segment the apex possible infarct and peri-infarct ischemia  . CHF (congestive heart failure) (HCC)    EF 40-45%.  . Chronic kidney disease   . COPD (chronic obstructive pulmonary disease) ([place])   . Diabetic neuropathy ([place])   . Heart murmur    Likely related to  aortic sclerosis  . Hyperlipidemia   . Hypertension   . Hypothyroidism   . Ischemic cardiomyopathy - Notable Improvement in EF post CABG 10/20/2013   Echo 2/23: EF 20-25%; mild LVH. anteroseptal Akinesis; mid-apical anterior, inferior & inferoseptal + apical-lateral akinesis; G 1 DDysfxn.; Mild-Mod LA dil; mild MR; Mod PHTN;; F/u Echo 12/2013: EF 40-45%, septal & apical HK, mild LVH; Mod LA dilation  . Left bundle branch block (LBBB) on electrocardiogram   . Mild mitral regurgitation by prior echocardiogram    Mild-Mod MR on Echo  . Morbid obesity ([place])   . OSTEOARTHRITIS 08/06/2006  . S/P CABG x 3 10/23/2013   LIMA to LAD, SVG to OM2, SVG to RPLB, EVH via right thigh  . Type II diabetes mellitus with complication ([place]) 88/82/8003   off rx since heart attack 12/15-dr told could stay off if keep cbg under 150  . UTI (lower urinary tract infection) 09/29/14   ongoing now. started rx 09/28/14   Past Surgical History:  Procedure Laterality Date  . ABDOMINAL HYSTERECTOMY    . BIV ICD INSERTION CRT-D N/A 07/28/2019   Procedure: BIV ICD INSERTION CRT-D;  Surgeon: Constance Haw, MD;  Location: [facility];  Service: Cardiovascular;  Laterality: N/A;  . BREAST LUMPECTOMY W/ NEEDLE LOCALIZATION Right 10/06/2014   Dr Georgette Dover  . BREAST LUMPECTOMY WITH NEEDLE LOCALIZATION Right 10/06/2014   Procedure: RIGHT BREAST LUMPECTOMY WITH NEEDLE LOCALIZATION;  Surgeon: Donnie Mesa, MD;  Location: [facility];  Service: General;  Laterality: Right;  . CARDIOVERSION N/A 10/22/2019   Procedure: CARDIOVERSION;  Surgeon: Loralie Champagne  S, MD;  Location: [facility];  Service: Cardiovascular;  Laterality: N/A;  . CLIPPING OF ATRIAL APPENDAGE N/A 10/23/2013   Procedure: CLIPPING OF ATRIAL APPENDAGE;  Surgeon: Rexene Alberts, MD;  Location: [facility];  Service: Open Heart Surgery;  Laterality: N/A;  . 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  . CORONARY ARTERY BYPASS GRAFT N/A 10/23/2013   Procedure: CORONARY ARTERY  BYPASS GRAFTING (CABG);  Surgeon: Rexene Alberts, MD;  Location: [facility];  Service: Open Heart Surgery: LIMA-LAD, SVG-OM2, SVG-RPL  . INTRAOPERATIVE TRANSESOPHAGEAL ECHOCARDIOGRAM N/A 10/23/2013   Procedure: INTRAOPERATIVE TRANSESOPHAGEAL ECHOCARDIOGRAM;  Surgeon: Rexene Alberts, MD;  Location: [facility];  Service: Open Heart Surgery;  Laterality: N/A;  . LEFT HEART CATHETERIZATION WITH CORONARY ANGIOGRAM N/A 10/20/2013   Procedure: LEFT HEART CATHETERIZATION WITH CORONARY ANGIOGRAM;  Surgeon: Leonie Man, MD;  Location: [facility] C[facility] LAB;  Service: Cardiovascular: Distal LM ~70%, LAD - ostial 70%, prox 80, 95 & 95%; RCA prox 70%, mid 80%; minimal Cx disease  . LEFT HEART CATHETERIZATION WITH CORONARY/GRAFT ANGIOGRAM N/A 09/08/2014   Procedure: LEFT HEART CATHETERIZATION WITH Beatrix Fetters;  Surgeon: Leonie Man, MD;  Location: [facility] CATH LAB: Indication: Abnormal Myoview. Occluded LAD after 70 and 80% left main. Diffuse RCA disease. Widely patent grafts to moderately diseased artery vessels. Mild-moderate LV dysfunction and chronic A. fib.  [name] NM MYOVIEW LTD  08/14/2014   [place] scan: INTERMEDIATE RISK - large, severe intensity perfusion defect in anterior wall, apex and inferior apex that is partly reversible. This suggests either scar or possible hibernating myocardium. Nondeviated.-> Likely scar. No change in coronary anatomy with patent grafts.  [name] TOTAL HIP ARTHROPLASTY  2010, 2011   right 2010, left 2011  . TRANSTHORACIC ECHOCARDIOGRAM  12/2013   Mildly dilated left ventricle with mild to moderately reduced function. EF 40-45% (notable improvement from February 2015). Septal and apical hypokinesis. Mild LA dilation.     Current Outpatient Medications  Medication Sig Dispense Refill  . acetaminophen (TYLENOL) 650 MG CR tablet Take 1,300 mg by mouth 2 (two) times daily.    [name] albuterol (PROAIR HFA) 108 (90 Base) MCG/ACT inhaler Inhale 2 puffs into the lungs every 6 (six) hours as needed for  wheezing or shortness of breath.    [name] apixaban (ELIQUIS) 5 MG TABS tablet Take 1 tablet (5 mg total) by mouth 2 (two) times daily. Resume Saturday evening. 60 tablet   . Ascorbic Acid (VITAMIN C) 1000 MG tablet Take 2,000 mg by mouth daily.    [name] aspirin EC 81 MG tablet Take 1 tablet (81 mg total) by mouth daily. (Patient taking differently: Take 81 mg by mouth daily at 2 PM. )    . AZO-CRANBERRY PO Take 1 tablet by mouth 2 (two) times daily.     . carboxymethylcellulose (REFRESH PLUS) 0.5 % SOLN Place 1 drop into the left eye daily.    . Coenzyme Q10 (COQ-10) 100 MG CAPS Take 100 mg by mouth daily.    . colchicine 0.6 MG tablet Take 0.6 mg by mouth daily.    . Dulaglutide (TRULICITY) 4.25 ZD/6.3OV SOPN Inject 0.75 mg into the skin every Sunday at 6pm.     . ezetimibe (ZETIA) 10 MG tablet Take 10 mg by mouth daily.    . ferrous sulfate 325 (65 FE) MG tablet TAKE ONE TABLET BY MOUTH ONCE DAILY WITH  BREAKFAST (Patient taking differently: Take 325 mg by mouth every other day. ) 30 tablet 4  . furosemide (  LASIX) 40 MG tablet Take 2 tablets (80mg  total) by mouth every morning 90 tablet 2  . ipratropium-albuterol (DUONEB) 0.5-2.5 (3) MG/3ML SOLN Take 3 mLs by nebulization every 4 (four) hours as needed (for shortness of breath).     . isosorbide-hydrALAZINE (BIDIL) 20-37.5 MG tablet Take 1 tablet by mouth 3 (three) times daily. 90 tablet 5  . levothyroxine (SYNTHROID) 112 MCG tablet Take 1 tablet (112 mcg total) by mouth daily before breakfast. 30 tablet 12  . metoprolol succinate (TOPROL-XL) 100 MG 24 hr tablet Take 1 tablet (100 mg total) by mouth daily. Take with or immediately following a meal. 30 tablet 2  . Misc Natural Products (JOINT HEALTH PO) Take 30 mLs by mouth daily.    . Multiple Vitamin (MULTIVITAMIN WITH MINERALS) TABS tablet Take 1 tablet by mouth daily.    . Multiple Vitamins-Minerals (PRESERVISION AREDS 2) CAPS Take 1 capsule by mouth 2 (two) times daily.    . prednisoLONE acetate  (PRED FORTE) 1 % ophthalmic suspension Place 1 drop into the right eye daily.    . rosuvastatin (CRESTOR) 5 MG tablet Take 5 mg by mouth See admin instructions. Take one tablet (5 mg) by mouth at night on Sunday, Tuesday, Thursday, Saturday    . sodium chloride (MURO 128) 2 % ophthalmic solution Place 1 drop into the right eye daily.    [name] umeclidinium-vilanterol (ANORO ELLIPTA) 62.5-25 MCG/INH AEPB Inhale 1 puff into the lungs daily.     No current facility-administered medications for this visit.    Allergies:   Crestor [rosuvastatin calcium], Allopurinol, and Tape   Social History:  The patient  reports that she quit smoking about 26 years ago. Her smoking use included cigarettes. She has a 19.50 pack-year smoking history. She has never used smokeless tobacco. She reports that she does not drink alcohol or use drugs.   Family History:  The patient's family history includes Hypertension in her mother; Other in her mother. She was adopted.   ROS:  Please see the history of present illness.   Otherwise, review of systems is positive for none.   All other systems are reviewed and negative.   PHYSICAL EXAM: VS:  BP (!) 112/58   Pulse 86   Ht 5\' 2"  (1.575 m)   Wt 231 lb 8 oz (105 kg)   SpO2 96%   BMI 42.34 kg/m  , BMI Body mass index is 42.34 kg/m. GEN: Well nourished, well developed, in no acute distress  HEENT: normal  Neck: no JVD, carotid bruits, or masses Cardiac: RRR; no murmurs, rubs, or gallops,no edema  Respiratory:  clear to auscultation bilaterally, normal work of breathing GI: soft, nontender, nondistended, + BS MS: no deformity or atrophy  Skin: warm and dry, device site well healed Neuro:  Strength and sensation are intact Psych: euthymic mood, full affect  EKG:  EKG is ordered today. Personal review of the ekg ordered shows atrial fibrillation, ventricular paced  Personal review of the device interrogation today. Results in [place]: 06/12/2019: ALT  20 09/26/2019: B Natriuretic Peptide 371.4 10/20/2019: BUN 66; Creatinine, Ser 2.99; Hemoglobin 12.8; Magnesium 2.8; Platelets 211; Potassium 4.5; Sodium 137    Lipid Panel     Component Value Date/Time   CHOL 131 06/13/2019 1940   TRIG 71 06/13/2019 1940   HDL 57 06/13/2019 1940   CHOLHDL 2.3 06/13/2019 1940   VLDL 14 06/13/2019 1940   LDLCALC 60 06/13/2019 1940     Wt Readings  from Last 3 Encounters:  10/27/19 231 lb 8 oz (105 kg)  10/22/19 233 lb 11.2 oz (106 kg)  10/20/19 236 lb 3.2 oz (107.1 kg)      Other studies Reviewed: Additional studies/ records that were reviewed today include: TTE 06/13/19  Review of the above records today demonstrates:   1. Left ventricular ejection fraction, by visual estimation, is <20%. The left ventricle has severely decreased function. There is mildly increased left ventricular hypertrophy.  2. Left ventricular diastolic function could not be evaluated pattern of LV diastolic filling.  3. Global right ventricle has normal systolic function.The right ventricular size is normal. No increase in right ventricular wall thickness.  4. Left atrial size was normal.  5. Right atrial size was normal.  6. Moderate calcification of the mitral valve leaflet(s).  7. Moderate thickening of the mitral valve leaflet(s).  8. The mitral valve is abnormal. Trace mitral valve regurgitation.  9. The tricuspid valve is normal in structure. Tricuspid valve regurgitation is mild. 10. The aortic valve is tricuspid Aortic valve regurgitation is trivial by color flow Doppler. Mild to moderate aortic valve sclerosis/calcification without any evidence of aortic stenosis. 11. The pulmonic valve was normal in structure. Pulmonic valve regurgitation is trivial by color flow Doppler. 12. Moderately elevated pulmonary artery systolic pressure. 13. The inferior vena cava is normal in size with greater than 50% respiratory variability, suggesting right atrial pressure of 3  mmHg. 14. The atrial septum is grossly normal.   ASSESSMENT AND PLAN:  1.  Chronic systolic heart failure due to ischemic cardiomyopathy: Currently on Lasix, Toprol-XL, losartan.  She had not tolerated higher doses of any of these medications.  She is now status post [place] CRT-D 07/28/2019.  Device functioning appropriately.  No changes.    2.  Permanent atrial fibrillation: Currently on Eliquis.  Heart rates well controlled.  Atrial fibrillation appears to be permanent.  CHA2DS2-VASc of 6.  No changes.    3.  Coronary artery disease status post CABG: No current chest pain.  Continue current management.  Current medicines are reviewed at length with the patient today.   The patient does not have concerns regarding her medicines.  The following changes were made today:  none  Labs/ tests ordered today include:  Orders Placed This Encounter  Procedures  . EKG 12-Lead     Disposition:   FU with Nyashia Raney 9 months  Signed, Chinwe Lope Meredith Leeds, MD  10/27/2019 3:08 PM     [facility] [address] [place] [address] [phone] (office) [phone] (fax)

## 2019-10-27 NOTE — Patient Instructions (Signed)
Medication Instructions:  Your physician recommends that you continue on your current medications as directed. Please refer to the Current Medication list given to you today.  *If you need a refill on your cardiac medications before your next appointment, please call your pharmacy*   Lab Work: None ordered If you have labs (blood work) drawn today and your tests are completely normal, you will receive your results only by: [name] MyChart Message (if you have MyChart) OR . A paper copy in the mail If you have any lab test that is abnormal or we need to change your treatment, we will call you to review the results.   Testing/Procedures: None ordered   Follow-Up: Remote monitoring is used to monitor your Pacemaker of ICD from home. This monitoring reduces the number of office visits required to check your device to one time per year. It allows [place] to keep an eye on the functioning of your device to ensure it is working properly. You are scheduled for a device check from home on 10/28/2019. You may send your transmission at any time that day. If you have a wireless device, the transmission will be sent automatically. After your physician reviews your transmission, you will receive a postcard with your next transmission date.   At [facility], you and your health needs are our priority.  As part of our continuing mission to provide you with exceptional heart care, we have created designated Provider Care Teams.  These Care Teams include your primary Cardiologist (physician) and Advanced Practice Providers (APPs -  Physician Assistants and Nurse Practitioners) who all work together to provide you with the care you need, when you need it.  We recommend signing up for the patient portal called "MyChart".  Sign up information is provided on this After Visit Summary.  MyChart is used to connect with patients for Virtual Visits (Telemedicine).  Patients are able to view lab/test results, encounter notes,  upcoming appointments, etc.  Non-urgent messages can be sent to your provider as well.   To learn more about what you can do with MyChart, go to [email or web address].    Your next appointment:   9 month(s)  The format for your next appointment:   In Person  Provider:   Allegra Lai, MD   Thank you for choosing [facility]!!   Trinidad Curet, RN [phone]

## 2019-10-28 ENCOUNTER — Ambulatory Visit (INDEPENDENT_AMBULATORY_CARE_PROVIDER_SITE_OTHER): Payer: Medicare Other | Admitting: *Deleted

## 2019-10-28 DIAGNOSIS — I255 Ischemic cardiomyopathy: Secondary | ICD-10-CM

## 2019-10-28 LAB — CUP PACEART REMOTE DEVICE CHECK
Battery Remaining Longevity: 68 mo
Battery Remaining Percentage: 92 %
Battery Voltage: 2.96 V
Date Time Interrogation Session: 20210302070649
HighPow Impedance: 57 Ohm
Implantable Lead Implant Date: 20201130
Implantable Lead Implant Date: 20201130
Implantable Lead Location: 753858
Implantable Lead Location: 753860
Implantable Pulse Generator Implant Date: 20201130
Lead Channel Impedance Value: 440 Ohm
Lead Channel Impedance Value: 460 Ohm
Lead Channel Pacing Threshold Amplitude: 0.5 V
Lead Channel Pacing Threshold Amplitude: 0.5 V
Lead Channel Pacing Threshold Pulse Width: 0.5 ms
Lead Channel Pacing Threshold Pulse Width: 1 ms
Lead Channel Sensing Intrinsic Amplitude: 12 mV
Lead Channel Setting Pacing Amplitude: 2.5 V
Lead Channel Setting Pacing Amplitude: 2.5 V
Lead Channel Setting Pacing Pulse Width: 0.5 ms
Lead Channel Setting Pacing Pulse Width: 1 ms
Lead Channel Setting Sensing Sensitivity: 0.5 mV
Pulse Gen Serial Number: 810000193

## 2019-10-28 NOTE — Progress Notes (Signed)
ICD Remote  

## 2019-10-29 MED ORDER — ISOSORB DINITRATE-HYDRALAZINE 20-37.5 MG PO TABS: 1.0000 | ORAL_TABLET | Freq: Three times a day (TID) | ORAL | 5 refills | Status: DC

## 2019-10-30 ENCOUNTER — Telehealth: Payer: Self-pay

## 2019-10-30 NOTE — Telephone Encounter (Signed)
Please check with her to see what this was.

## 2019-10-30 NOTE — Telephone Encounter (Signed)
Pt called in stating that she was having an allergic reaction to a new medication that she was put on. Pt c/o ears being swollen and red, hands itching, and she has broken out in a rash. Pt states she has been taking Benadryl but it is not working. Pt would like a call back [phone].

## 2019-10-31 ENCOUNTER — Telehealth (HOSPITAL_COMMUNITY): Payer: Self-pay | Admitting: Cardiology

## 2019-10-31 MED ORDER — ISOSORB DINITRATE-HYDRALAZINE 20-37.5 MG PO TABS: 0.5000 | ORAL_TABLET | Freq: Three times a day (TID) | ORAL | 5 refills | Status: DC

## 2019-10-31 NOTE — Telephone Encounter (Signed)
Left message to return call 

## 2019-10-31 NOTE — Telephone Encounter (Signed)
Surprised that would happen just with increasing the dose.  Lauren, what do you think? Could try holding for a couple days then dropping back down to prior dose.

## 2019-10-31 NOTE — Telephone Encounter (Signed)
Patient called to report rash side effect after increasing bidil to one tab three times daily   severe Itching in hands, ears, and face. Mild swelling to hands. Has already taken benadryl and triamcinolone cream with mild relief   Advised would forward to provider and pharmacist. Rash was not a common side effect that I was aware of. Should hold dose until answer from provider.

## 2019-10-31 NOTE — Telephone Encounter (Signed)
I agree, it would be very surprising for that to occur after just increasing the dose, especially since the strength would not have been changed. I think it is reasonable to hold for a few days and then decrease back to her previous dose of 1/2 tablet TID. I will give her a call.

## 2019-10-31 NOTE — Telephone Encounter (Signed)
Updated patient medication record to reflect decreasing Bidil dose to 1/2 tablet TID. Patient will call if holding Bidil for a few days and then restarting at the lower dose does not result in resolution of rash.   Audry Riles, PharmD, BCPS, BCCP, CPP [facility] Failure Clinic Pharmacist [phone]

## 2019-11-05 NOTE — Telephone Encounter (Signed)
See telephone message from 3/5

## 2019-11-10 ENCOUNTER — Other Ambulatory Visit (HOSPITAL_COMMUNITY): Payer: Self-pay | Admitting: *Deleted

## 2019-11-10 DIAGNOSIS — I5022 Chronic systolic (congestive) heart failure: Secondary | ICD-10-CM

## 2019-11-11 ENCOUNTER — Other Ambulatory Visit: Payer: Self-pay

## 2019-11-11 ENCOUNTER — Ambulatory Visit (HOSPITAL_BASED_OUTPATIENT_CLINIC_OR_DEPARTMENT_OTHER)
Admission: RE | Admit: 2019-11-11 | Discharge: 2019-11-11 | Disposition: A | Discharge status: Home / Self Care | Payer: Medicare Other | Source: Ambulatory Visit | Attending: Cardiology | Admitting: Cardiology

## 2019-11-11 ENCOUNTER — Ambulatory Visit (HOSPITAL_COMMUNITY)
Admission: RE | Admit: 2019-11-11 | Discharge: 2019-11-11 | Disposition: A | Discharge status: Home / Self Care | Payer: Medicare Other | Source: Ambulatory Visit | Attending: Cardiology | Admitting: Cardiology

## 2019-11-11 ENCOUNTER — Other Ambulatory Visit (HOSPITAL_COMMUNITY): Payer: Medicare Other

## 2019-11-11 VITALS — BP 118/64 | HR 84 | Wt 238.0 lb

## 2019-11-11 DIAGNOSIS — I130 Hypertensive heart and chronic kidney disease with heart failure and stage 1 through stage 4 chronic kidney disease, or unspecified chronic kidney disease: Secondary | ICD-10-CM | POA: Diagnosis not present

## 2019-11-11 DIAGNOSIS — E1122 Type 2 diabetes mellitus with diabetic chronic kidney disease: Secondary | ICD-10-CM | POA: Diagnosis not present

## 2019-11-11 DIAGNOSIS — Z95810 Presence of automatic (implantable) cardiac defibrillator: Secondary | ICD-10-CM | POA: Insufficient documentation

## 2019-11-11 DIAGNOSIS — I482 Chronic atrial fibrillation, unspecified: Secondary | ICD-10-CM | POA: Insufficient documentation

## 2019-11-11 DIAGNOSIS — E785 Hyperlipidemia, unspecified: Secondary | ICD-10-CM | POA: Diagnosis not present

## 2019-11-11 DIAGNOSIS — Z8249 Family history of ischemic heart disease and other diseases of the circulatory system: Secondary | ICD-10-CM | POA: Diagnosis not present

## 2019-11-11 DIAGNOSIS — Z7901 Long term (current) use of anticoagulants: Secondary | ICD-10-CM | POA: Diagnosis not present

## 2019-11-11 DIAGNOSIS — J449 Chronic obstructive pulmonary disease, unspecified: Secondary | ICD-10-CM | POA: Diagnosis not present

## 2019-11-11 DIAGNOSIS — I5022 Chronic systolic (congestive) heart failure: Secondary | ICD-10-CM | POA: Insufficient documentation

## 2019-11-11 DIAGNOSIS — Z951 Presence of aortocoronary bypass graft: Secondary | ICD-10-CM | POA: Insufficient documentation

## 2019-11-11 DIAGNOSIS — I4821 Permanent atrial fibrillation: Secondary | ICD-10-CM | POA: Diagnosis not present

## 2019-11-11 DIAGNOSIS — I2510 Atherosclerotic heart disease of native coronary artery without angina pectoris: Secondary | ICD-10-CM | POA: Diagnosis not present

## 2019-11-11 DIAGNOSIS — Z79899 Other long term (current) drug therapy: Secondary | ICD-10-CM | POA: Diagnosis not present

## 2019-11-11 DIAGNOSIS — I255 Ischemic cardiomyopathy: Secondary | ICD-10-CM | POA: Diagnosis not present

## 2019-11-11 DIAGNOSIS — N183 Chronic kidney disease, stage 3 unspecified: Secondary | ICD-10-CM | POA: Diagnosis not present

## 2019-11-11 DIAGNOSIS — Z87891 Personal history of nicotine dependence: Secondary | ICD-10-CM | POA: Diagnosis not present

## 2019-11-11 HISTORY — PX: TRANSTHORACIC ECHOCARDIOGRAM: SHX275

## 2019-11-11 LAB — BASIC METABOLIC PANEL
Anion gap: 12 (ref 5–15)
BUN: 51 mg/dL — ABNORMAL HIGH (ref 8–23)
CO2: 26 mmol/L (ref 22–32)
Calcium: 9.5 mg/dL (ref 8.9–10.3)
Chloride: 104 mmol/L (ref 98–111)
Creatinine, Ser: 2.44 mg/dL — ABNORMAL HIGH (ref 0.44–1.00)
GFR calc Af Amer: 21 mL/min — ABNORMAL LOW (ref >60)
GFR calc non Af Amer: 18 mL/min — ABNORMAL LOW (ref >60)
Glucose, Bld: 108 mg/dL — ABNORMAL HIGH (ref 70–99)
Potassium: 3.6 mmol/L (ref 3.5–5.1)
Sodium: 142 mmol/L (ref 135–145)

## 2019-11-11 MED ORDER — TORSEMIDE 20 MG PO TABS: 60.0000 mg | ORAL_TABLET | Freq: Every day | ORAL | 5 refills | Status: DC

## 2019-11-11 NOTE — Progress Notes (Signed)
  Echocardiogram 2D Echocardiogram has been performed.  Darlina Sicilian M 11/11/2019, 10:52 AM

## 2019-11-11 NOTE — Patient Instructions (Addendum)
STOP Aspirin  STOP Lasix  START Torsemide 60mg  (3 tabs) in the morning and 40mg  (2 tabs) in the evening for ONE DAY, THEN 60mg  (3 tabs) daily after that.   Labs today and repeat in 1 week on Tuesday, MARCH 23rd, 2021 at 12:30pm We will only contact you if something comes back abnormal or we need to make some changes. Otherwise no news is good news!   Your physician recommends that you schedule a follow-up appointment in: 10 days with the Nurse Practitioner   Please call office at [phone] option 2 if you have any questions or concerns.    At the [facility], you and your health needs are our priority. As part of our continuing mission to provide you with exceptional heart care, we have created designated Provider Care Teams. These Care Teams include your primary Cardiologist (physician) and Advanced Practice Providers (APPs- Physician Assistants and Nurse Practitioners) who all work together to provide you with the care you need, when you need it.   You may see any of the following providers on your designated Care Team at your next follow up: [name] Dr Glori Bickers . Dr Loralie Champagne . Darrick Grinder, NP . Lyda Jester, PA . Audry Riles, PharmD   Please be sure to bring in all your medications bottles to every appointment.

## 2019-11-11 NOTE — Progress Notes (Signed)
PCP: Marton Redwood, MD Cardiology: Dr. Ellyn Hack HF Cardiology: Dr. Aundra Dubin  79 y.o. with history of CAD s/p CABG, COPD, CKD stage 3, and chronic systolic systolic CHF/ischemic cardiomyopathy was referred by Dr. Ellyn Hack for evaluation of CHF.  Patient was admitted in 2/15 with atrial fibrillation/RVR and chest pain.  Cath showed severe 3VD and she had CABG x 3.  Echo in 2/15 showed EF down to 20-25%. In 2018, it appears that she went back into atrial fibrillation and has remained in atrial fibrillation persistently.  DCCV was not attempted.   Most recent echo in 10/20 showed EF < 20% with normal RV.  She had a St Jude CRT-D device implanted in 11/20.  She says that she feels "90%" better with CRT.  She is followed by Dr. Hollie Salk with nephrology.   She has been in atrial fibrillation persistently, I attempted DCCV while on amiodarone but she did not convert.  Therefore, I stopped amiodarone.    Echo was done today and reviewed, EF 20% with diffuse hypokinesis, mildly dilated RV with mildly decreased systolic function, PASP 49 mmHg, dilated IVC.   She returns now for followup of CHF.  Creatinine recently up to 2.99 and Lasix was decreased, now on 80 mg daily.  Dyspnea has worsened recently, especially since cutting back Lasix.  Weight is actually stable.  She is short of breath walking around the house. Legs have been swelling.  She uses a walker.  She is unable to walk around the grocery store due to dyspnea.  No chest pain.  No orthopnea/PND.    ECG (personally reviewed): atrial fibrillation, BiV pacing (personally reviewed).  Labs (10/20): LDL 60 Labs (11/20): K 4.3, creatinine 1.97 Labs (2/21): K 4.5, creatinine 2.08 => 2.99, hgb 12.8  PMH: 1. CAD: s/p CABG in 2/15 with LIMA-LAD, SVG-OM2, SVG-PLV.  2. LBBB: Chronic.  3. Atrial fibrillation: Persistent since 2018.  - LA appendage clip with CBGdldkl 4. H/o aspiration PNA 5. Type 2 diabetes 6. COPD 7. CKD stage 3 8. Chronic systolic CHF:  Ischemic cardiomyopathy.  She has a St Jude BiV ICD.  - Echo (2/15): EF 20-25%.  - Echo (5/15): EF 40-45% - Echo (10/20): EF <20%, mild LVH, normal RV size and systolic function.  - Echo (3/21): EF 20% with diffuse hypokinesis, mildly dilated RV with mildly decreased systolic function, PASP 49 mmHg, dilated IVC.  9. CKD: Stage 3.   Social History   Socioeconomic History  . Marital status: Widowed    Spouse name: Not on file  . Number of children: Not on file  . Years of education: Not on file  . Highest education level: Not on file  Occupational History  . Occupation: retired  Tobacco Use  . Smoking status: Former Smoker    Packs/day: 0.50    Years: 39.00    Pack years: 19.50    Types: Cigarettes    Quit date: 08/28/1993    Years since quitting: 26.2  . Smokeless tobacco: Never Used  Substance and Sexual Activity  . Alcohol use: No    Comment: quit 95  . Drug use: No  . Sexual activity: Not on file  Other Topics Concern  . Not on file  Social History Narrative   Lives with husband and son in [place].  She is currently retired.   Former smoker quit in 1995.   Social Determinants of Health   Financial Resource Strain:   . Difficulty of Paying Living Expenses:   Food Insecurity:   .  Worried About Charity fundraiser in the Last Year:   . Arboriculturist in the Last Year:   Transportation Needs:   . Film/video editor (Medical):   [name] Lack of Transportation (Non-Medical):   Physical Activity:   . Days of Exercise per Week:   . Minutes of Exercise per Session:   Stress:   . Feeling of Stress :   Social Connections:   . Frequency of Communication with Friends and Family:   . Frequency of Social Gatherings with Friends and Family:   . Attends Religious Services:   . Active Member of Clubs or Organizations:   . Attends Archivist Meetings:   [name] Marital Status:   Intimate Partner Violence:   . Fear of Current or Ex-Partner:   . Emotionally Abused:   [name]  Physically Abused:   . Sexually Abused:    Family History  Adopted: Yes  Problem Relation Age of Onset  . Other Mother   . Hypertension Mother   . Colon cancer Neg Hx   . Esophageal cancer Neg Hx   . Rectal cancer Neg Hx   . Stomach cancer Neg Hx    Current Outpatient Medications  Medication Sig Dispense Refill  . acetaminophen (TYLENOL) 650 MG CR tablet Take 1,300 mg by mouth 2 (two) times daily.    [name] albuterol (PROAIR HFA) 108 (90 Base) MCG/ACT inhaler Inhale 2 puffs into the lungs every 6 (six) hours as needed for wheezing or shortness of breath.    [name] apixaban (ELIQUIS) 5 MG TABS tablet Take 1 tablet (5 mg total) by mouth 2 (two) times daily. Resume Saturday evening. 60 tablet   . Ascorbic Acid (VITAMIN C) 1000 MG tablet Take 2,000 mg by mouth daily.    . AZO-CRANBERRY PO Take 1 tablet by mouth 2 (two) times daily.     . carboxymethylcellulose (REFRESH PLUS) 0.5 % SOLN Place 1 drop into the left eye daily.    . Coenzyme Q10 (COQ-10) 100 MG CAPS Take 100 mg by mouth daily.    . colchicine 0.6 MG tablet Take 0.6 mg by mouth daily.    . Dulaglutide (TRULICITY) 6.56 CL/2.7NT SOPN Inject 0.75 mg into the skin every Sunday at 6pm.     . ezetimibe (ZETIA) 10 MG tablet Take 10 mg by mouth daily.    . ferrous sulfate 325 (65 FE) MG tablet Take 325 mg by mouth every other day.    . fluticasone (CUTIVATE) 0.05 % cream APPLY TO AFFECTED AREAS (FACE AND EARS) UP TO TWICE A DAY OR AS NEEDED    . ipratropium-albuterol (DUONEB) 0.5-2.5 (3) MG/3ML SOLN Take 3 mLs by nebulization every 4 (four) hours as needed (for shortness of breath).     . isosorbide-hydrALAZINE (BIDIL) 20-37.5 MG tablet Take 0.5 tablets by mouth 3 (three) times daily. 45 tablet 5  . levothyroxine (SYNTHROID) 112 MCG tablet Take 1 tablet (112 mcg total) by mouth daily before breakfast. 30 tablet 12  . metoprolol succinate (TOPROL-XL) 100 MG 24 hr tablet Take 1 tablet (100 mg total) by mouth daily. Take with or immediately following  a meal. 30 tablet 2  . Misc Natural Products (JOINT HEALTH PO) Take 30 mLs by mouth daily.    . Multiple Vitamin (MULTIVITAMIN WITH MINERALS) TABS tablet Take 1 tablet by mouth daily.    . Multiple Vitamins-Minerals (PRESERVISION AREDS 2) CAPS Take 1 capsule by mouth 2 (two) times daily.    . prednisoLONE acetate (PRED  FORTE) 1 % ophthalmic suspension Place 1 drop into the right eye daily.    . rosuvastatin (CRESTOR) 5 MG tablet Take 5 mg by mouth See admin instructions. Take one tablet (5 mg) by mouth at night on Sunday, Tuesday, Thursday, Saturday    . sodium chloride (MURO 128) 2 % ophthalmic solution Place 1 drop into the right eye daily.    [name] triamcinolone cream (KENALOG) 0.1 % APPLY TO THE AFFECTED AREA UP TO TWICE DAILY OR AS NEEDED (NO TO FACE GROIN AXILLA)    . umeclidinium-vilanterol (ANORO ELLIPTA) 62.5-25 MCG/INH AEPB Inhale 1 puff into the lungs daily.    [name] torsemide (DEMADEX) 20 MG tablet Take 3 tablets (60 mg total) by mouth daily. 90 tablet 5   No current facility-administered medications for this encounter.   BP 118/64   Pulse 84   Wt 108 kg (238 lb)   SpO2 99%   BMI 43.53 kg/m  General: NAD Neck: JVP 10-12 cm, no thyromegaly or thyroid nodule.  Lungs: Clear to auscultation bilaterally with normal respiratory effort. CV: Nondisplaced PMI.  Heart irregular S1/S2, no S3/S4, no murmur.  1+ edema to knees.  No carotid bruit.  Normal pedal pulses.  Abdomen: Soft, nontender, no hepatosplenomegaly, no distention.  Skin: Intact without lesions or rashes.  Neurologic: Alert and oriented x 3.  Psych: Normal affect. Extremities: No clubbing or cyanosis.  HEENT: Normal.   Assessment/Plan: 1. Chronic systolic CHF: Ischemic cardiomyopathy.  St Jude CRT-D device.  Echo was done today and reviewed, showing EF 20% with diffuse hypokinesis, mildly dilated RV with mildly decreased systolic function, PASP 49 mmHg, dilated IVC.  NYHA class III symptoms, she is volume overloaded on exam.   - Stop Lasix, start torsemide 60 qam/40 qpm x 1 day then torsemide 60 mg daily after that. BMET today and again in 1 week.  - Continue Bidil 1/2 tab tid.  - Continue Toprol XL 100 mg daily.  - If creatinine stabilizes, will start spironolactone.  2. CAD: S/p CABG in 2015. No chest pain.  - Stop ASA given apixaban use.  - Continue Crestor 5 daily.  3. Atrial fibrillation: Chronic now, looks like it has gone on since 2018.  I was unable to cardiovert her on amiodarone so it was stopped.  I do not think that I will be able to get her out of atrial fibrillation.  - Continue apixaban.  4. CKD: Stage 3.  Needs BMET today.   Followup in 10 days with NP/PA to reassess volume.   Loralie Champagne 11/11/2019

## 2019-11-17 ENCOUNTER — Ambulatory Visit: Payer: Medicare Other

## 2019-11-17 ENCOUNTER — Telehealth: Payer: Self-pay

## 2019-11-17 LAB — CUP PACEART REMOTE DEVICE CHECK
Battery Remaining Longevity: 68 mo
Battery Remaining Percentage: 91 %
Battery Voltage: 2.98 V
Date Time Interrogation Session: 20210322145545
HighPow Impedance: 53 Ohm
Implantable Lead Implant Date: 20201130
Implantable Lead Implant Date: 20201130
Implantable Lead Location: 753858
Implantable Lead Location: 753860
Implantable Pulse Generator Implant Date: 20201130
Lead Channel Impedance Value: 460 Ohm
Lead Channel Impedance Value: 490 Ohm
Lead Channel Pacing Threshold Amplitude: 0.5 V
Lead Channel Pacing Threshold Amplitude: 0.5 V
Lead Channel Pacing Threshold Pulse Width: 0.5 ms
Lead Channel Pacing Threshold Pulse Width: 1 ms
Lead Channel Sensing Intrinsic Amplitude: 12 mV
Lead Channel Setting Pacing Amplitude: 2.5 V
Lead Channel Setting Pacing Amplitude: 2.5 V
Lead Channel Setting Pacing Pulse Width: 0.5 ms
Lead Channel Setting Pacing Pulse Width: 1 ms
Lead Channel Setting Sensing Sensitivity: 0.5 mV
Pulse Gen Serial Number: 810000193

## 2019-11-17 MED ORDER — TORSEMIDE 20 MG PO TABS: 60.0000 mg | ORAL_TABLET | Freq: Two times a day (BID) | ORAL | 2 refills | Status: DC

## 2019-11-17 NOTE — Telephone Encounter (Signed)
Spoke with patient.  Advised Dr Aundra Dubin recommended to take Torsemide 60 mg bid and BMET on Thursday.  She repeated instructions back correctly.  Advised if symptoms or condition worsens to call back and provided ICM number.  BMET appointment changed from Tuesday 3/23 to Thursday, 3/25.  Scheduled ICM remote transmission for 3/30 to recheck fluid levels.

## 2019-11-17 NOTE — Telephone Encounter (Signed)
Referred to [facility] clinic by Dr Curt Bears after 10/27/2019 office visit. ICM intro given and she reports sending a mychart message this AM to Dr Curt Bears which was routed Dr Claris Gladden office.    At 3/16 OV with Dr Aundra Dubin Furosemide 80 mg daily was switched to 60 mg Torsemide daily.    She is having the following symptoms:    WEIGHT: 241.9 lbs which is a 3 lb weight gain in the last 2 days  SOB on exertion  Decreased urine output since starting Torsemide on 3/17 (switched from Furosemide)  She limits fluid intake to 44 oz daily as recommended by her nephrologist.    Currently taking Torsemide 60 mg daily.  She knows she has fluid and wants to ask Dr Aundra Dubin if she can increase Torsemide dosage.   11/11/2019 Creatinine 2.44, BUN 51, Potassium 3.6, Sodium 142, GFR 18-21 10/20/2019 Creatinine 2.99, BUN 66, Potassium 4.5, Sodium 137, GFR 14-17.  Assisted patient with sending remote transmission from mobile app.  11/17/2019 ICM trend suggesting possible fluid accumulation starting 3/18.

## 2019-11-17 NOTE — Telephone Encounter (Signed)
Increase torsemide to 60 mg bid with BMET on Thursday.

## 2019-11-18 ENCOUNTER — Other Ambulatory Visit (HOSPITAL_COMMUNITY): Payer: Medicare Other

## 2019-11-20 ENCOUNTER — Ambulatory Visit (HOSPITAL_COMMUNITY)
Admission: RE | Admit: 2019-11-20 | Discharge: 2019-11-20 | Disposition: A | Discharge status: Home / Self Care | Payer: Medicare Other | Source: Ambulatory Visit | Attending: Cardiology | Admitting: Cardiology

## 2019-11-20 ENCOUNTER — Other Ambulatory Visit: Payer: Self-pay

## 2019-11-20 DIAGNOSIS — I4891 Unspecified atrial fibrillation: Secondary | ICD-10-CM | POA: Diagnosis not present

## 2019-11-20 LAB — BASIC METABOLIC PANEL
Anion gap: 11 (ref 5–15)
BUN: 52 mg/dL — ABNORMAL HIGH (ref 8–23)
CO2: 30 mmol/L (ref 22–32)
Calcium: 9.8 mg/dL (ref 8.9–10.3)
Chloride: 100 mmol/L (ref 98–111)
Creatinine, Ser: 2.24 mg/dL — ABNORMAL HIGH (ref 0.44–1.00)
GFR calc Af Amer: 23 mL/min — ABNORMAL LOW (ref >60)
GFR calc non Af Amer: 20 mL/min — ABNORMAL LOW (ref >60)
Glucose, Bld: 204 mg/dL — ABNORMAL HIGH (ref 70–99)
Potassium: 3.7 mmol/L (ref 3.5–5.1)
Sodium: 141 mmol/L (ref 135–145)

## 2019-11-20 LAB — CBC
HCT: 37.7 % (ref 36.0–46.0)
Hemoglobin: 11.9 g/dL — ABNORMAL LOW (ref 12.0–15.0)
MCH: 29 pg (ref 26.0–34.0)
MCHC: 31.6 g/dL (ref 30.0–36.0)
MCV: 91.7 fL (ref 80.0–100.0)
Platelets: 229 10*3/uL (ref 150–400)
RBC: 4.11 MIL/uL (ref 3.87–5.11)
RDW: 16.2 % — ABNORMAL HIGH (ref 11.5–15.5)
WBC: 5.4 10*3/uL (ref 4.0–10.5)
nRBC: 0 % (ref 0.0–0.2)

## 2019-11-20 LAB — PROTIME-INR
INR: 1.5 — ABNORMAL HIGH (ref 0.8–1.2)
Prothrombin Time: 18.4 seconds — ABNORMAL HIGH (ref 11.4–15.2)

## 2019-11-25 ENCOUNTER — Ambulatory Visit (INDEPENDENT_AMBULATORY_CARE_PROVIDER_SITE_OTHER): Payer: Medicare Other

## 2019-11-25 ENCOUNTER — Other Ambulatory Visit: Payer: Self-pay

## 2019-11-25 ENCOUNTER — Ambulatory Visit (HOSPITAL_COMMUNITY)
Admission: RE | Admit: 2019-11-25 | Discharge: 2019-11-25 | Disposition: A | Discharge status: Home / Self Care | Payer: Medicare Other | Source: Ambulatory Visit | Attending: Cardiology | Admitting: Cardiology

## 2019-11-25 ENCOUNTER — Encounter (HOSPITAL_COMMUNITY): Payer: Self-pay

## 2019-11-25 VITALS — BP 130/68 | HR 84 | Wt 231.4 lb

## 2019-11-25 DIAGNOSIS — I255 Ischemic cardiomyopathy: Secondary | ICD-10-CM | POA: Diagnosis not present

## 2019-11-25 DIAGNOSIS — E1122 Type 2 diabetes mellitus with diabetic chronic kidney disease: Secondary | ICD-10-CM | POA: Diagnosis not present

## 2019-11-25 DIAGNOSIS — I447 Left bundle-branch block, unspecified: Secondary | ICD-10-CM | POA: Insufficient documentation

## 2019-11-25 DIAGNOSIS — Z79899 Other long term (current) drug therapy: Secondary | ICD-10-CM | POA: Diagnosis not present

## 2019-11-25 DIAGNOSIS — I5022 Chronic systolic (congestive) heart failure: Secondary | ICD-10-CM | POA: Diagnosis not present

## 2019-11-25 DIAGNOSIS — I5042 Chronic combined systolic (congestive) and diastolic (congestive) heart failure: Secondary | ICD-10-CM | POA: Diagnosis not present

## 2019-11-25 DIAGNOSIS — Z95810 Presence of automatic (implantable) cardiac defibrillator: Secondary | ICD-10-CM | POA: Diagnosis not present

## 2019-11-25 DIAGNOSIS — N183 Chronic kidney disease, stage 3 unspecified: Secondary | ICD-10-CM | POA: Diagnosis not present

## 2019-11-25 DIAGNOSIS — Z7901 Long term (current) use of anticoagulants: Secondary | ICD-10-CM | POA: Diagnosis not present

## 2019-11-25 DIAGNOSIS — Z951 Presence of aortocoronary bypass graft: Secondary | ICD-10-CM | POA: Insufficient documentation

## 2019-11-25 DIAGNOSIS — I482 Chronic atrial fibrillation, unspecified: Secondary | ICD-10-CM | POA: Insufficient documentation

## 2019-11-25 DIAGNOSIS — I2510 Atherosclerotic heart disease of native coronary artery without angina pectoris: Secondary | ICD-10-CM | POA: Insufficient documentation

## 2019-11-25 DIAGNOSIS — J449 Chronic obstructive pulmonary disease, unspecified: Secondary | ICD-10-CM | POA: Insufficient documentation

## 2019-11-25 DIAGNOSIS — Z87891 Personal history of nicotine dependence: Secondary | ICD-10-CM | POA: Diagnosis not present

## 2019-11-25 LAB — CBC
HCT: 38.8 % (ref 36.0–46.0)
Hemoglobin: 12.3 g/dL (ref 12.0–15.0)
MCH: 28.9 pg (ref 26.0–34.0)
MCHC: 31.7 g/dL (ref 30.0–36.0)
MCV: 91.3 fL (ref 80.0–100.0)
Platelets: 216 10*3/uL (ref 150–400)
RBC: 4.25 MIL/uL (ref 3.87–5.11)
RDW: 15.7 % — ABNORMAL HIGH (ref 11.5–15.5)
WBC: 5.7 10*3/uL (ref 4.0–10.5)
nRBC: 0 % (ref 0.0–0.2)

## 2019-11-25 LAB — BASIC METABOLIC PANEL
Anion gap: 14 (ref 5–15)
BUN: 39 mg/dL — ABNORMAL HIGH (ref 8–23)
CO2: 28 mmol/L (ref 22–32)
Calcium: 9.4 mg/dL (ref 8.9–10.3)
Chloride: 96 mmol/L — ABNORMAL LOW (ref 98–111)
Creatinine, Ser: 2.32 mg/dL — ABNORMAL HIGH (ref 0.44–1.00)
GFR calc Af Amer: 22 mL/min — ABNORMAL LOW (ref >60)
GFR calc non Af Amer: 19 mL/min — ABNORMAL LOW (ref >60)
Glucose, Bld: 157 mg/dL — ABNORMAL HIGH (ref 70–99)
Potassium: 3.3 mmol/L — ABNORMAL LOW (ref 3.5–5.1)
Sodium: 138 mmol/L (ref 135–145)

## 2019-11-25 NOTE — Patient Instructions (Signed)
Labs done today, your results will be available in MyChart, we will contact you for abnormal readings.  Your physician recommends that you schedule a follow-up appointment in: 6 weeks  If you have any questions or concerns before your next appointment please send [place] a message through [place] or call our office at [phone].  At the [facility], you and your health needs are our priority. As part of our continuing mission to provide you with exceptional heart care, we have created designated Provider Care Teams. These Care Teams include your primary Cardiologist (physician) and Advanced Practice Providers (APPs- Physician Assistants and Nurse Practitioners) who all work together to provide you with the care you need, when you need it.   You may see any of the following providers on your designated Care Team at your next follow up: [name] Dr Glori Bickers . Dr Loralie Champagne . Darrick Grinder, NP . Lyda Jester, PA . Audry Riles, PharmD   Please be sure to bring in all your medications bottles to every appointment.

## 2019-11-25 NOTE — Progress Notes (Signed)
PCP: Marton Redwood, MD Cardiology: Dr. Ellyn Hack HF Cardiology: Dr. Aundra Dubin  Reason for Visit: F/u for Chronic Systolic Heart Failure  79 y.o. with history of CAD s/p CABG, COPD, CKD stage 3, and chronic systolic systolic CHF/ischemic cardiomyopathy was referred by Dr. Ellyn Hack for evaluation of CHF.  Patient was admitted in 2/15 with atrial fibrillation/RVR and chest pain.  Cath showed severe 3VD and she had CABG x 3.  Echo in 2/15 showed EF down to 20-25%. In 2018, it appears that she went back into atrial fibrillation and has remained in atrial fibrillation persistently.  DCCV was not attempted.   Most recent echo in 10/20 showed EF < 20% with normal RV.  She had a St Jude CRT-D device implanted in 11/20.  She says that she feels "90%" better with CRT.  She is followed by Dr. Hollie Salk with nephrology.   She has been in atrial fibrillation persistently. Dr. Aundra Dubin recently attempted DCCV while on amiodarone but she did not convert.  Therefore, he stopped amiodarone.    Recent Echo 11/11/19 showed EF 20% with diffuse hypokinesis, mildly dilated RV with mildly decreased systolic function, PASP 49 mmHg, dilated IVC.   Recently seen in clinic 3/16 and volume overloaded. Diuretic regimen change. Lasix discontinued and changed to torsemide. She was instructed to start torsemide 60 qam/40 qpm x 1 day then torsemide 60 mg daily after that. On 3/22, received notification from EP clinic that [place] parameters had increased above threshold c/w volume overload. Torsemide further increased to 80 mg bid. Parameters checked again today and thoracic impedance returned to normal after Torsemide increased to 60 mg twice a day.  Impedance started rising above baseline normal on 3/27.  She presents back to clinic today w/ her son. He is visiting from [place]. She feels much better. Wt is down. No resting dyspnea. Asymptomatic w/ her afib. Rate is controlled. BP well controlled. Reports full compliance w/ Eliquis. No  abnormal bleeding. She lives home alone and having increased difficulty caring for herself at home. Her son is worried about increased confusion at times. Needs help w/ meds. Also needs some help w/ ADLs around her home.    Labs (10/20): LDL 60 Labs (11/20): K 4.3, creatinine 1.97 Labs (2/21): K 4.5, creatinine 2.08 => 2.99, hgb 12.8 Labs (3/21): K 3.7, creatinine 2.24   PMH: 1. CAD: s/p CABG in 2/15 with LIMA-LAD, SVG-OM2, SVG-PLV.  2. LBBB: Chronic.  3. Atrial fibrillation: Persistent since 2018.  - LA appendage clip with CBGdldkl 4. H/o aspiration PNA 5. Type 2 diabetes 6. COPD 7. CKD stage 3 8. Chronic systolic CHF: Ischemic cardiomyopathy.  She has a St Jude BiV ICD.  - Echo (2/15): EF 20-25%.  - Echo (5/15): EF 40-45% - Echo (10/20): EF <20%, mild LVH, normal RV size and systolic function.  - Echo (3/21): EF 20% with diffuse hypokinesis, mildly dilated RV with mildly decreased systolic function, PASP 49 mmHg, dilated IVC.  9. CKD: Stage 3.   Social History   Socioeconomic History  . Marital status: Widowed    Spouse name: Not on file  . Number of children: Not on file  . Years of education: Not on file  . Highest education level: Not on file  Occupational History  . Occupation: retired  Tobacco Use  . Smoking status: Former Smoker    Packs/day: 0.50    Years: 39.00    Pack years: 19.50    Types: Cigarettes    Quit date: 08/28/1993  Years since quitting: 26.2  . Smokeless tobacco: Never Used  Substance and Sexual Activity  . Alcohol use: No    Comment: quit 95  . Drug use: No  . Sexual activity: Not on file  Other Topics Concern  . Not on file  Social History Narrative   Lives with husband and son in [place].  She is currently retired.   Former smoker quit in 1995.   Social Determinants of Health   Financial Resource Strain:   . Difficulty of Paying Living Expenses:   Food Insecurity:   . Worried About Charity fundraiser in the Last Year:   . Arts development officer in the Last Year:   Transportation Needs:   . Film/video editor (Medical):   [name] Lack of Transportation (Non-Medical):   Physical Activity:   . Days of Exercise per Week:   . Minutes of Exercise per Session:   Stress:   . Feeling of Stress :   Social Connections:   . Frequency of Communication with Friends and Family:   . Frequency of Social Gatherings with Friends and Family:   . Attends Religious Services:   . Active Member of Clubs or Organizations:   . Attends Archivist Meetings:   [name] Marital Status:   Intimate Partner Violence:   . Fear of Current or Ex-Partner:   . Emotionally Abused:   [name] Physically Abused:   . Sexually Abused:    Family History  Adopted: Yes  Problem Relation Age of Onset  . Other Mother   . Hypertension Mother   . Colon cancer Neg Hx   . Esophageal cancer Neg Hx   . Rectal cancer Neg Hx   . Stomach cancer Neg Hx    Current Outpatient Medications  Medication Sig Dispense Refill  . acetaminophen (TYLENOL) 650 MG CR tablet Take 1,300 mg by mouth 2 (two) times daily.    [name] albuterol (PROAIR HFA) 108 (90 Base) MCG/ACT inhaler Inhale 2 puffs into the lungs every 6 (six) hours as needed for wheezing or shortness of breath.    [name] apixaban (ELIQUIS) 5 MG TABS tablet Take 1 tablet (5 mg total) by mouth 2 (two) times daily. Resume Saturday evening. 60 tablet   . Ascorbic Acid (VITAMIN C) 1000 MG tablet Take 2,000 mg by mouth daily.    . AZO-CRANBERRY PO Take 1 tablet by mouth 2 (two) times daily.     . carboxymethylcellulose (REFRESH PLUS) 0.5 % SOLN Place 1 drop into the left eye daily.    . Coenzyme Q10 (COQ-10) 100 MG CAPS Take 100 mg by mouth daily.    . colchicine 0.6 MG tablet Take 0.6 mg by mouth daily.    . Dulaglutide (TRULICITY) 0.25 EN/2.7PO SOPN Inject 0.75 mg into the skin every Sunday at 6pm.     . ezetimibe (ZETIA) 10 MG tablet Take 10 mg by mouth daily.    . ferrous sulfate 325 (65 FE) MG tablet Take 325 mg by mouth  every other day.    . fluticasone (CUTIVATE) 0.05 % cream APPLY TO AFFECTED AREAS (FACE AND EARS) UP TO TWICE A DAY OR AS NEEDED    . ipratropium-albuterol (DUONEB) 0.5-2.5 (3) MG/3ML SOLN Take 3 mLs by nebulization every 4 (four) hours as needed (for shortness of breath).     . isosorbide-hydrALAZINE (BIDIL) 20-37.5 MG tablet Take 0.5 tablets by mouth 3 (three) times daily. 45 tablet 5  . levothyroxine (SYNTHROID) 112 MCG tablet Take 1  tablet (112 mcg total) by mouth daily before breakfast. 30 tablet 12  . metoprolol succinate (TOPROL-XL) 100 MG 24 hr tablet Take 1 tablet (100 mg total) by mouth daily. Take with or immediately following a meal. 30 tablet 2  . Misc Natural Products (JOINT HEALTH PO) Take 30 mLs by mouth daily.    . Multiple Vitamin (MULTIVITAMIN WITH MINERALS) TABS tablet Take 1 tablet by mouth daily.    . Multiple Vitamins-Minerals (PRESERVISION AREDS 2) CAPS Take 1 capsule by mouth 2 (two) times daily.    . prednisoLONE acetate (PRED FORTE) 1 % ophthalmic suspension Place 1 drop into the right eye daily.    . rosuvastatin (CRESTOR) 5 MG tablet Take 5 mg by mouth See admin instructions. Take one tablet (5 mg) by mouth at night on Sunday, Tuesday, Thursday, Saturday    . sodium chloride (MURO 128) 2 % ophthalmic solution Place 1 drop into the right eye daily.    [name] torsemide (DEMADEX) 20 MG tablet Take 3 tablets (60 mg total) by mouth 2 (two) times daily. 540 tablet 2  . triamcinolone cream (KENALOG) 0.1 % APPLY TO THE AFFECTED AREA UP TO TWICE DAILY OR AS NEEDED (NO TO FACE GROIN AXILLA)    . umeclidinium-vilanterol (ANORO ELLIPTA) 62.5-25 MCG/INH AEPB Inhale 1 puff into the lungs daily.     No current facility-administered medications for this encounter.   BP 130/68   Pulse 84   Wt 105 kg   SpO2 98%   BMI 42.32 kg/m  PHYSICAL EXAM: General:  Well appearing moderately obese elderly AAF. No respiratory difficulty HEENT: normal Neck: supple. no JVD. Carotids 2+ bilat; no  bruits. No lymphadenopathy or thyromegaly appreciated. Cor: PMI nondisplaced. Irregularly irregular rhythm, regular rate. No rubs, gallops or murmurs. Lungs: clear Abdomen: soft, nontender, nondistended. No hepatosplenomegaly. No bruits or masses. Good bowel sounds. Extremities: no cyanosis, clubbing, rash, edema Neuro: alert & oriented x 3, cranial nerves grossly intact. moves all 4 extremities w/o difficulty. Affect pleasant.  Assessment/Plan: 1. Chronic systolic CHF: Ischemic cardiomyopathy.  St Jude CRT-D device.  Echo 3/21 showed EF 20% with diffuse hypokinesis, mildly dilated RV with mildly decreased systolic function, PASP 49 mmHg, dilated IVC.   - Volume improved w/ recent diuretic dose adjustment. Euvolemic by exam and by [employer]  - NYHA class II-III symptoms  - Continue Torsemide 60 mg bid.   - Continue Bidil 1/2 tab tid.  - Continue Toprol XL 100 mg daily.  - Check BMP today - Per Dr. Aundra Dubin, If creatinine stabilizes will start spironolactone.  2. CAD: S/p CABG in 2015. No chest pain.  - no longer on asa due to chronic Eliquis   - Continue Crestor 5 daily.  3. Atrial fibrillation: Chronic now, looks like it has gone on since 2018.  Dr. Aundra Dubin was unable to cardiovert her on amiodarone so it was stopped.  Do not think that we will be able to get her out of atrial fibrillation.  - Continue apixaban. Denies abnormal bleeding  4. CKD: Stage 3.   - Check BMP today  - followed by Dr. Hollie Salk  5. Home Health Needs: Will refer to paramedicine to assist w/ meds and help w/ compliance.  - will also place referral for [facility] RN. Appreciate SW's assistance  F/u again in 6 weeks   Lyda Jester, PA-C  11/25/2019

## 2019-11-25 NOTE — Progress Notes (Signed)
EPIC Encounter for ICM Monitoring  Patient Name: Elaine [name] is a 79 y.o. female Date: 11/25/2019 Primary Care Physican: Marton Redwood, MD Primary Cardiologist: [facility] Electrophysiologist: Curt Bears Bi-V Pacing: 98%  11/25/2019 Weight: 232.1 lbs        Heart Failure questions reviewed.  Pt feeling much better since fluid accumulation has resolved.  She is no longer SOB when walking and weight has returned to baseline.   Report: Thoracic impedance returned to normal after Torsemide increased to 60 mg twice a day by Dr Aundra Dubin on 3/22.  Impedance starting rising above baseline normal on 3/27.  Prescribed:  Torsemide 20 mg take 60 mg twice a day (increased on 3/22)  Labs:  11/20/2019 Creatinine 2.24, BUN 52, Potassium 3.7, Sodium 141, GFR 20-23 11/11/2019 Creatinine 2.44, BUN 51, Potassium 3.6, Sodium 142, GFR 18-21 10/20/2019 Creatinine 2.99, BUN 66, Potassium 4.5, Sodium 137, GFR 14-17 09/26/2019 Creatinine 2.08, BUN 71, Potassium 4.0, Sodium 137, GFR 22-26  Recommendations: No changes and encouraged to call if experiencing any fluid symptoms.  She has HF clinic appt today.  Follow-up plan: ICM clinic phone appointment on 12/29/2019.   91 day device clinic remote transmission 01/27/2020.  Office appt 11/25/2019 with Dr. Marijo [facility] PA/NP.    Copy of ICM check sent to Dr. Curt Bears and Darrick Grinder, NP HF clinic for review at appointment today if needed.   3 month ICM trend: 11/25/2019    1 Year ICM trend:       [name], RN 11/25/2019 8:58 AM

## 2019-11-25 NOTE — Progress Notes (Addendum)
Paramedicine Initial Assessment:  Housing:  In what kind of housing do you live? House/apt/trailer/shelter? apartment  Do you rent/pay a mortgage/own? rent  Do you live with anyone? No lives alone  Are you currently worried about losing your housing? no  Within the past 12 months have you ever stayed outside, in a car, tent, a shelter, or temporarily with someone? no  Within the past 12 months have you been unable to get utilities when it was really needed? no  Social:  What is your current marital status? widowed  Do you have any children? 4- 3 sons and a daughter  Do you have family or friends who live locally? Have 3 children who live locally but report they are often not reliable.  Has one son who lives in [place] who is willing to be helpful but who patient reports she uses as a last resort as she doesn't like to bother him.  Also reports two good neighbors who check in on her but who have people they take care of full time so they can't offer higher level of support.  Pt states she is also very involved in her church and has a good relationship with the clergy has even received assistance paying for bills from them in the past.  Food:  Within the past 12 months were you ever worried that food would run out before you got money to buy more? no  Within the past 79months have you run out of food and didn't have money to buy more? No  Receives food stamps  Income:  What is your current source of income? SSDI- approx $1,100  How hard is it for you to pay for the basics like food housing, medical care, and utilities? Somewhat hard  Do you have outstanding medical bills? no  Insurance:  Are you currently insured?  Yes UHC Medicare and Medicaid  Do you have prescription coverage? yes  Transportation:  Do you have transportation to your medical appointments? yes   If yes, how? Reports she continues to drive herself  In the past 12 months has lack of transportation kept you  from medical appts or from getting medications? no  In the past 12 months has lack of transportation kept you from meetings, work, or getting things you needed? no   Daily Health Needs: Do you have a working scale at home? Yes- has a talking scale  How do you manage your medications at home? Reports she uses a pill box which she fills herself  Do you ever take your medications differently than prescribed? Not intentionally  Do you have issues affording your medications? Pt son, Elaine [name], who was with her states she has not gotten her medications sometimes due to inability to afford.  If yes, has this ever prevented you from obtaining medications? yes  Do you have any concerns with mobility at home? Yes- reports she can complete ADLs with assistive devices but that she has trouble cleaning house or doing anything that requires a lot of physical effort.  Do you use any assistive devices at home or have PCS at home? Shower chair, walker, sometimes a wheelchair  Do you have a PCP?  yes- [facility] Elaine [name]  Are there any additional barriers you see to getting the care you need?  None at this time- pt reports she has been hesitant to ask for help but feels like she is at a place that she is willing to accept and hopeful that it will make a difference  in her quality of life.  Patient agreeable to paramedicine program and to CSW making a PCS referral for an aid to come in to her home through her Medicaid benefit.  CSW completed referral form and submitted to [facility] for review- fax confirmation received.  CSW will continue to follow through paramedicine program and assist as needed.  Jorge Ny, LCSW Clinical Social Worker [facility] Desk#: [phone] Cell#: [phone]

## 2019-11-26 ENCOUNTER — Telehealth (HOSPITAL_COMMUNITY): Payer: Self-pay | Admitting: Cardiology

## 2019-11-26 DIAGNOSIS — I5042 Chronic combined systolic (congestive) and diastolic (congestive) heart failure: Secondary | ICD-10-CM

## 2019-11-26 MED ORDER — SPIRONOLACTONE 25 MG PO TABS: 12.5000 mg | ORAL_TABLET | Freq: Every day | ORAL | 3 refills | Status: DC

## 2019-11-26 NOTE — Telephone Encounter (Signed)
Pt aware and voiced understanding   Kerry Dory, Corinne  11/25/2019 3:48 PM EDT    [phone] (M) LMOM    Lyda Jester [place], [place]  11/25/2019 1:27 PM EDT    Scr has stabilized. K 3.3. Add low dose spironolactone 12.5 mg daily and recheck BMP in 1 week.

## 2019-11-26 NOTE — Telephone Encounter (Signed)
-----   Message from Consuelo Pandy, [place] sent at 11/25/2019  1:27 PM EDT ----- Scr has stabilized. K 3.3. Add low dose spironolactone 12.5 mg daily and recheck BMP in 1 week.

## 2019-11-26 NOTE — Addendum Note (Signed)
Encounter addended by: Jorge Ny, LCSW on: 11/26/2019 8:45 AM  Actions taken: Clinical Note Signed

## 2019-11-27 ENCOUNTER — Telehealth (HOSPITAL_COMMUNITY): Payer: Self-pay

## 2019-11-27 NOTE — Telephone Encounter (Signed)
I called Ms Lenart to schedule an initial visit. She did not answer so I left a message with my information and requested she call me back.   Jacquiline Doe, EMT 11/27/19

## 2019-11-28 ENCOUNTER — Other Ambulatory Visit (HOSPITAL_COMMUNITY): Payer: Self-pay

## 2019-11-28 NOTE — Progress Notes (Signed)
Paramedicine Encounter    Patient ID: Elaine [name], female    DOB: 1940/09/11, 79 y.o.   MRN: [id number]   Patient Care Team: Marton Redwood, MD as PCP - General (Internal Medicine) Leonie Man, MD as PCP - Cardiology (Cardiology) Constance Haw, MD as PCP - Electrophysiology (Cardiology) Madelon Lips, MD as Consulting Physician (Nephrology) Jorge Ny, LCSW as Social Worker (Licensed Clinical Social Worker)  Patient Active Problem List   Diagnosis Date Noted  . Chronic systolic (congestive) heart failure ([place]) 07/29/2019  . Hypothyroidism 06/16/2019  . Acute on chronic combined systolic and diastolic CHF (congestive heart failure) ([place]) 06/11/2019  . Bilateral leg pain 10/22/2017  . Chronic combined systolic and diastolic CHF, NYHA class 2 ([place]) 10/22/2017  . Edema of both legs 05/10/2015  . Mass of right breast on mammogram 10/06/2014  . DOE (dyspnea on exertion) 08/19/2014  . Severe obesity (BMI >= 40) ([place]) 04/19/2014  . CAD status post CABG 04/19/2014  . Metabolic acidosis 14/43/1540    Class: Acute  . Acute on chronic renal insufficiency 02/02/2014  . Near syncope 02/02/2014  . Hypotension 02/02/2014  . Rash 02/02/2014  . Acute kidney failure ([place]) 02/02/2014    Class: Acute  . Pruritus 02/02/2014    Class: Chronic  . SOB (shortness of breath) 01/07/2014  . Coumadin Rx post CABG 11/17/2013  . Physical deconditioning 11/05/2013  . S/P CABG x 3- 10/23/13 10/23/2013  . Permanent atrial fibrillation (HCC) - CHA2DS2-VASc Score 6, on Eliquis 10/20/2013  . Left bundle branch block (LBBB) on electrocardiogram 10/20/2013  . Left main coronary artery disease 10/20/2013    Class: Hospitalized for  . History of acute anterior wall myocardial infarction 10/20/2013  . Ischemic cardiomyopathy 10/20/2013  . Morbid obesity- BMI 48   . Diabetic neuropathy ([place])   . Acute kidney injury superimposed on chronic kidney disease ([place])   . Obesity 12/20/2006  . Type II  diabetes mellitus with complication ([place]) 08/67/6195  . Hyperlipidemia with target LDL less than 70 08/06/2006  . Essential hypertension 08/06/2006  . OSTEOARTHRITIS 08/06/2006    Current Outpatient Medications:  .  acetaminophen (TYLENOL) 650 MG CR tablet, Take 1,300 mg by mouth 2 (two) times daily., Disp: , Rfl:  .  albuterol (PROAIR HFA) 108 (90 Base) MCG/ACT inhaler, Inhale 2 puffs into the lungs every 6 (six) hours as needed for wheezing or shortness of breath., Disp: , Rfl:  .  apixaban (ELIQUIS) 5 MG TABS tablet, Take 1 tablet (5 mg total) by mouth 2 (two) times daily. Resume Saturday evening., Disp: 60 tablet, Rfl:  .  Ascorbic Acid (VITAMIN C) 1000 MG tablet, Take 2,000 mg by mouth daily., Disp: , Rfl:  .  AZO-CRANBERRY PO, Take 1 tablet by mouth 2 (two) times daily. , Disp: , Rfl:  .  carboxymethylcellulose (REFRESH PLUS) 0.5 % SOLN, Place 1 drop into the left eye daily., Disp: , Rfl:  .  Coenzyme Q10 (COQ-10) 100 MG CAPS, Take 100 mg by mouth daily., Disp: , Rfl:  .  colchicine 0.6 MG tablet, Take 0.6 mg by mouth daily., Disp: , Rfl:  .  Dulaglutide (TRULICITY) 0.93 OI/7.1IW SOPN, Inject 0.75 mg into the skin every Sunday at 6pm. , Disp: , Rfl:  .  ezetimibe (ZETIA) 10 MG tablet, Take 10 mg by mouth daily., Disp: , Rfl:  .  ferrous sulfate 325 (65 FE) MG tablet, Take 325 mg by mouth every other day., Disp: , Rfl:  .  fluticasone (  CUTIVATE) 0.05 % cream, APPLY TO AFFECTED AREAS (FACE AND EARS) UP TO TWICE A DAY OR AS NEEDED, Disp: , Rfl:  .  ipratropium-albuterol (DUONEB) 0.5-2.5 (3) MG/3ML SOLN, Take 3 mLs by nebulization every 4 (four) hours as needed (for shortness of breath). , Disp: , Rfl:  .  isosorbide-hydrALAZINE (BIDIL) 20-37.5 MG tablet, Take 0.5 tablets by mouth 3 (three) times daily., Disp: 45 tablet, Rfl: 5 .  levothyroxine (SYNTHROID) 112 MCG tablet, Take 1 tablet (112 mcg total) by mouth daily before breakfast., Disp: 30 tablet, Rfl: 12 .  metoprolol succinate  (TOPROL-XL) 100 MG 24 hr tablet, Take 1 tablet (100 mg total) by mouth daily. Take with or immediately following a meal., Disp: 30 tablet, Rfl: 2 .  Misc Natural Products (JOINT HEALTH PO), Take 30 mLs by mouth daily., Disp: , Rfl:  .  Multiple Vitamin (MULTIVITAMIN WITH MINERALS) TABS tablet, Take 1 tablet by mouth daily., Disp: , Rfl:  .  Multiple Vitamins-Minerals (PRESERVISION AREDS 2) CAPS, Take 1 capsule by mouth 2 (two) times daily., Disp: , Rfl:  .  prednisoLONE acetate (PRED FORTE) 1 % ophthalmic suspension, Place 1 drop into the right eye daily., Disp: , Rfl:  .  rosuvastatin (CRESTOR) 5 MG tablet, Take 5 mg by mouth See admin instructions. Take one tablet (5 mg) by mouth at night on Sunday, Tuesday, Thursday, Saturday, Disp: , Rfl:  .  sodium chloride (MURO 128) 2 % ophthalmic solution, Place 1 drop into the right eye daily., Disp: , Rfl:  .  spironolactone (ALDACTONE) 25 MG tablet, Take 0.5 tablets (12.5 mg total) by mouth daily., Disp: 45 tablet, Rfl: 3 .  torsemide (DEMADEX) 20 MG tablet, Take 3 tablets (60 mg total) by mouth 2 (two) times daily., Disp: 540 tablet, Rfl: 2 .  triamcinolone cream (KENALOG) 0.1 %, APPLY TO THE AFFECTED AREA UP TO TWICE DAILY OR AS NEEDED (NO TO FACE GROIN AXILLA), Disp: , Rfl:  .  umeclidinium-vilanterol (ANORO ELLIPTA) 62.5-25 MCG/INH AEPB, Inhale 1 puff into the lungs daily., Disp: , Rfl:  Allergies  Allergen Reactions  . Crestor [Rosuvastatin Calcium] Other (See Comments)    Cramps- still taking   . Allopurinol Hives  . Tape Rash    Adhesive tape      Social History   Socioeconomic History  . Marital status: Widowed    Spouse name: Not on file  . Number of children: Not on file  . Years of education: Not on file  . Highest education level: Not on file  Occupational History  . Occupation: retired  Tobacco Use  . Smoking status: Former Smoker    Packs/day: 0.50    Years: 39.00    Pack years: 19.50    Types: Cigarettes    Quit date:  08/28/1993    Years since quitting: 26.2  . Smokeless tobacco: Never Used  Substance and Sexual Activity  . Alcohol use: No    Comment: quit 95  . Drug use: No  . Sexual activity: Not on file  Other Topics Concern  . Not on file  Social History Narrative   Lives with husband and son in [place].  She is currently retired.   Former smoker quit in 1995.   Social Determinants of Health   Financial Resource Strain: Medium Risk  . Difficulty of Paying Living Expenses: Somewhat hard  Food Insecurity: No Food Insecurity  . Worried About Charity fundraiser in the Last Year: Never true  . Ran Out of  Food in the Last Year: Never true  Transportation Needs: No Transportation Needs  . Lack of Transportation (Medical): No  . Lack of Transportation (Non-Medical): No  Physical Activity:   . Days of Exercise per Week:   . Minutes of Exercise per Session:   Stress:   . Feeling of Stress :   Social Connections:   . Frequency of Communication with Friends and Family:   . Frequency of Social Gatherings with Friends and Family:   . Attends Religious Services:   . Active Member of Clubs or Organizations:   . Attends Archivist Meetings:   [name] Marital Status:   Intimate Partner Violence:   . Fear of Current or Ex-Partner:   . Emotionally Abused:   [name] Physically Abused:   . Sexually Abused:     Physical Exam Cardiovascular:     Rate and Rhythm: Normal rate and regular rhythm.     Pulses: Normal pulses.  Pulmonary:     Effort: Pulmonary effort is normal.     Breath sounds: Normal breath sounds.  Musculoskeletal:        General: Normal range of motion.     Right lower leg: No edema.     Left lower leg: No edema.  Skin:    General: Skin is warm and dry.     Capillary Refill: Capillary refill takes less than 2 seconds.  Neurological:     Mental Status: She is alert and oriented to person, place, and time.  Psychiatric:        Mood and Affect: Mood normal.          Future Appointments  Date Time Provider [place]  12/05/2019  9:30 AM [facility] LAB [facility] None  12/29/2019 10:00 AM [facility] DEVICE REMOTES [facility]  01/06/2020 11:30 AM [facility] PA/NP [facility] None  01/13/2020  2:20 PM Bensimhon, Shaune Pascal, MD [facility] None  01/20/2020  9:00 AM Leonie Man, MD [facility]  01/27/2020  7:00 AM [facility] DEVICE REMOTES [facility]  04/27/2020  7:00 AM [facility] DEVICE REMOTES [facility]    BP 110/60 (BP Location: Left Arm, Patient Position: Sitting, Cuff Size: Large)   Pulse 84   Resp 16   Wt 238 lb 14.4 oz (108.4 kg)   SpO2 98%   BMI 43.70 kg/m   Weight yesterday- 238.9 lb Last visit weight- 231.4 lb  Elaine [name] was seen at home today and reported feeling well. This was our initial appointment so the majority of our time together was spent getting to know each other and explaining the HF paramedicine program. She advised that she has a good understanding of her medications and has been filling her own pillbox "for years." I reviewed her medications and verified her pillbox and found that she had made no mistakes. She was agreeable to participate in the program but I don't believe she will need intensive monitoring, though bi-weekly visits will be beneficial for social support, I will follow up next week to deliver more information and discuss a bi-weekly schedule.   Jacquiline Doe, EMT 11/28/19  ACTION: Home visit completed Next visit planned for 1 WEEK

## 2019-12-04 ENCOUNTER — Telehealth (HOSPITAL_COMMUNITY): Payer: Self-pay

## 2019-12-04 NOTE — Telephone Encounter (Signed)
I called Ms Schnider to schedule an appointment. She stated she would be available tomorrow so we agreed to meet at 12:30.  Jacquiline Doe, EMT 12/04/19

## 2019-12-05 ENCOUNTER — Other Ambulatory Visit: Payer: Self-pay

## 2019-12-05 ENCOUNTER — Other Ambulatory Visit (HOSPITAL_COMMUNITY): Payer: Self-pay

## 2019-12-05 ENCOUNTER — Ambulatory Visit (HOSPITAL_COMMUNITY)
Admission: RE | Admit: 2019-12-05 | Discharge: 2019-12-05 | Disposition: A | Discharge status: Home / Self Care | Payer: Medicare Other | Source: Ambulatory Visit | Attending: Cardiology | Admitting: Cardiology

## 2019-12-05 DIAGNOSIS — I5042 Chronic combined systolic (congestive) and diastolic (congestive) heart failure: Secondary | ICD-10-CM | POA: Diagnosis not present

## 2019-12-05 LAB — BASIC METABOLIC PANEL
Anion gap: 14 (ref 5–15)
BUN: 48 mg/dL — ABNORMAL HIGH (ref 8–23)
CO2: 26 mmol/L (ref 22–32)
Calcium: 9.7 mg/dL (ref 8.9–10.3)
Chloride: 98 mmol/L (ref 98–111)
Creatinine, Ser: 2.59 mg/dL — ABNORMAL HIGH (ref 0.44–1.00)
GFR calc Af Amer: 20 mL/min — ABNORMAL LOW (ref >60)
GFR calc non Af Amer: 17 mL/min — ABNORMAL LOW (ref >60)
Glucose, Bld: 131 mg/dL — ABNORMAL HIGH (ref 70–99)
Potassium: 3.7 mmol/L (ref 3.5–5.1)
Sodium: 138 mmol/L (ref 135–145)

## 2019-12-05 NOTE — Progress Notes (Signed)
Paramedicine Encounter    Patient ID: Elaine [name], female    DOB: Mar 17, 1941, 79 y.o.   MRN: [id number]   Patient Care Team: Marton Redwood, MD as PCP - General (Internal Medicine) Leonie Man, MD as PCP - Cardiology (Cardiology) Constance Haw, MD as PCP - Electrophysiology (Cardiology) Madelon Lips, MD as Consulting Physician (Nephrology) Jorge Ny, LCSW as Social Worker (Licensed Clinical Social Worker)  Patient Active Problem List   Diagnosis Date Noted  . Chronic systolic (congestive) heart failure ([place]) 07/29/2019  . Hypothyroidism 06/16/2019  . Acute on chronic combined systolic and diastolic CHF (congestive heart failure) ([place]) 06/11/2019  . Bilateral leg pain 10/22/2017  . Chronic combined systolic and diastolic CHF, NYHA class 2 ([place]) 10/22/2017  . Edema of both legs 05/10/2015  . Mass of right breast on mammogram 10/06/2014  . DOE (dyspnea on exertion) 08/19/2014  . Severe obesity (BMI >= 40) ([place]) 04/19/2014  . CAD status post CABG 04/19/2014  . Metabolic acidosis 74/25/9563    Class: Acute  . Acute on chronic renal insufficiency 02/02/2014  . Near syncope 02/02/2014  . Hypotension 02/02/2014  . Rash 02/02/2014  . Acute kidney failure ([place]) 02/02/2014    Class: Acute  . Pruritus 02/02/2014    Class: Chronic  . SOB (shortness of breath) 01/07/2014  . Coumadin Rx post CABG 11/17/2013  . Physical deconditioning 11/05/2013  . S/P CABG x 3- 10/23/13 10/23/2013  . Permanent atrial fibrillation (HCC) - CHA2DS2-VASc Score 6, on Eliquis 10/20/2013  . Left bundle branch block (LBBB) on electrocardiogram 10/20/2013  . Left main coronary artery disease 10/20/2013    Class: Hospitalized for  . History of acute anterior wall myocardial infarction 10/20/2013  . Ischemic cardiomyopathy 10/20/2013  . Morbid obesity- BMI 48   . Diabetic neuropathy ([place])   . Acute kidney injury superimposed on chronic kidney disease ([place])   . Obesity 12/20/2006  . Type II  diabetes mellitus with complication ([place]) 87/56/4332  . Hyperlipidemia with target LDL less than 70 08/06/2006  . Essential hypertension 08/06/2006  . OSTEOARTHRITIS 08/06/2006    Current Outpatient Medications:  .  acetaminophen (TYLENOL) 650 MG CR tablet, Take 1,300 mg by mouth 2 (two) times daily., Disp: , Rfl:  .  albuterol (PROAIR HFA) 108 (90 Base) MCG/ACT inhaler, Inhale 2 puffs into the lungs every 6 (six) hours as needed for wheezing or shortness of breath., Disp: , Rfl:  .  apixaban (ELIQUIS) 5 MG TABS tablet, Take 1 tablet (5 mg total) by mouth 2 (two) times daily. Resume Saturday evening., Disp: 60 tablet, Rfl:  .  Ascorbic Acid (VITAMIN C) 1000 MG tablet, Take 2,000 mg by mouth daily., Disp: , Rfl:  .  AZO-CRANBERRY PO, Take 1 tablet by mouth 2 (two) times daily. , Disp: , Rfl:  .  carboxymethylcellulose (REFRESH PLUS) 0.5 % SOLN, Place 1 drop into the left eye daily., Disp: , Rfl:  .  Coenzyme Q10 (COQ-10) 100 MG CAPS, Take 100 mg by mouth daily., Disp: , Rfl:  .  colchicine 0.6 MG tablet, Take 0.6 mg by mouth daily., Disp: , Rfl:  .  Dulaglutide (TRULICITY) 9.51 OA/4.1YS SOPN, Inject 0.75 mg into the skin every Sunday at 6pm. , Disp: , Rfl:  .  ezetimibe (ZETIA) 10 MG tablet, Take 10 mg by mouth daily., Disp: , Rfl:  .  ferrous sulfate 325 (65 FE) MG tablet, Take 325 mg by mouth every other day., Disp: , Rfl:  .  fluticasone (  CUTIVATE) 0.05 % cream, APPLY TO AFFECTED AREAS (FACE AND EARS) UP TO TWICE A DAY OR AS NEEDED, Disp: , Rfl:  .  ipratropium-albuterol (DUONEB) 0.5-2.5 (3) MG/3ML SOLN, Take 3 mLs by nebulization every 4 (four) hours as needed (for shortness of breath). , Disp: , Rfl:  .  isosorbide-hydrALAZINE (BIDIL) 20-37.5 MG tablet, Take 0.5 tablets by mouth 3 (three) times daily., Disp: 45 tablet, Rfl: 5 .  levothyroxine (SYNTHROID) 112 MCG tablet, Take 1 tablet (112 mcg total) by mouth daily before breakfast., Disp: 30 tablet, Rfl: 12 .  metoprolol succinate  (TOPROL-XL) 100 MG 24 hr tablet, Take 1 tablet (100 mg total) by mouth daily. Take with or immediately following a meal., Disp: 30 tablet, Rfl: 2 .  Misc Natural Products (JOINT HEALTH PO), Take 30 mLs by mouth daily., Disp: , Rfl:  .  Multiple Vitamin (MULTIVITAMIN WITH MINERALS) TABS tablet, Take 1 tablet by mouth daily., Disp: , Rfl:  .  Multiple Vitamins-Minerals (PRESERVISION AREDS 2) CAPS, Take 1 capsule by mouth 2 (two) times daily., Disp: , Rfl:  .  rosuvastatin (CRESTOR) 5 MG tablet, Take 5 mg by mouth See admin instructions. Take one tablet (5 mg) by mouth at night on Sunday, Tuesday, Thursday, Saturday, Disp: , Rfl:  .  sodium chloride (MURO 128) 2 % ophthalmic solution, Place 1 drop into the right eye daily., Disp: , Rfl:  .  spironolactone (ALDACTONE) 25 MG tablet, Take 0.5 tablets (12.5 mg total) by mouth daily., Disp: 45 tablet, Rfl: 3 .  torsemide (DEMADEX) 20 MG tablet, Take 3 tablets (60 mg total) by mouth 2 (two) times daily., Disp: 540 tablet, Rfl: 2 .  triamcinolone cream (KENALOG) 0.1 %, APPLY TO THE AFFECTED AREA UP TO TWICE DAILY OR AS NEEDED (NO TO FACE GROIN AXILLA), Disp: , Rfl:  .  umeclidinium-vilanterol (ANORO ELLIPTA) 62.5-25 MCG/INH AEPB, Inhale 1 puff into the lungs daily., Disp: , Rfl:  .  prednisoLONE acetate (PRED FORTE) 1 % ophthalmic suspension, Place 1 drop into the right eye daily., Disp: , Rfl:  Allergies  Allergen Reactions  . Crestor [Rosuvastatin Calcium] Other (See Comments)    Cramps- still taking   . Allopurinol Hives  . Tape Rash    Adhesive tape      Social History   Socioeconomic History  . Marital status: Widowed    Spouse name: Not on file  . Number of children: Not on file  . Years of education: Not on file  . Highest education level: Not on file  Occupational History  . Occupation: retired  Tobacco Use  . Smoking status: Former Smoker    Packs/day: 0.50    Years: 39.00    Pack years: 19.50    Types: Cigarettes    Quit date:  08/28/1993    Years since quitting: 26.2  . Smokeless tobacco: Never Used  Substance and Sexual Activity  . Alcohol use: No    Comment: quit 95  . Drug use: No  . Sexual activity: Not on file  Other Topics Concern  . Not on file  Social History Narrative   Lives with husband and son in [place].  She is currently retired.   Former smoker quit in 1995.   Social Determinants of Health   Financial Resource Strain: Medium Risk  . Difficulty of Paying Living Expenses: Somewhat hard  Food Insecurity: No Food Insecurity  . Worried About Charity fundraiser in the Last Year: Never true  . Ran Out of  Food in the Last Year: Never true  Transportation Needs: No Transportation Needs  . Lack of Transportation (Medical): No  . Lack of Transportation (Non-Medical): No  Physical Activity:   . Days of Exercise per Week:   . Minutes of Exercise per Session:   Stress:   . Feeling of Stress :   Social Connections:   . Frequency of Communication with Friends and Family:   . Frequency of Social Gatherings with Friends and Family:   . Attends Religious Services:   . Active Member of Clubs or Organizations:   . Attends Archivist Meetings:   [name] Marital Status:   Intimate Partner Violence:   . Fear of Current or Ex-Partner:   . Emotionally Abused:   [name] Physically Abused:   . Sexually Abused:     Physical Exam Cardiovascular:     Rate and Rhythm: Normal rate and regular rhythm.     Pulses: Normal pulses.  Pulmonary:     Effort: Pulmonary effort is normal.     Breath sounds: Normal breath sounds.  Musculoskeletal:        General: Normal range of motion.  Skin:    General: Skin is warm and dry.     Capillary Refill: Capillary refill takes less than 2 seconds.  Neurological:     Mental Status: She is alert and oriented to person, place, and time.  Psychiatric:        Mood and Affect: Mood normal.         Future Appointments  Date Time Provider [place]   12/29/2019 10:00 AM [facility] DEVICE REMOTES [facility]  01/06/2020 11:30 AM [facility] PA/NP [facility] None  01/13/2020  2:20 PM Bensimhon, Shaune Pascal, MD [facility] None  01/20/2020  9:00 AM Leonie Man, MD [facility]  01/27/2020  7:00 AM [facility] DEVICE REMOTES [facility]  04/27/2020  7:00 AM [facility] DEVICE REMOTES [facility]    BP 122/68 (BP Location: Left Arm, Patient Position: Sitting, Cuff Size: Normal)   Pulse 84   Resp 16   Wt 230 lb (104.3 kg)   SpO2 98%   BMI 42.07 kg/m   Weight yesterday-231.9 lb Last visit weight- 228.8 lb  Ms Will was seen at home today and reported feeling well. She denied chest pain, SOB, headache, dizziness, orthopnea, fever or cough over the past week. She stated she has been compliant with her mediations over the past week and her weight has been stable. Her medications were verified and she had already filled her pillbox. I check it for accuracy and found it to be flawless. I will follow up next week.   Jacquiline Doe, EMT 12/05/19  ACTION: Home visit completed Next visit planned for 1 week

## 2019-12-10 ENCOUNTER — Telehealth (HOSPITAL_COMMUNITY): Payer: Self-pay | Admitting: Cardiology

## 2019-12-10 DIAGNOSIS — I5042 Chronic combined systolic (congestive) and diastolic (congestive) heart failure: Secondary | ICD-10-CM

## 2019-12-10 NOTE — Telephone Encounter (Signed)
Pt aware repeat labs v4/21   Elaine [name]  12/09/2019 6:05 PM EDT    SCr up slightly w/ addition of spiro but K normal. Repeat BMP again in 1 week to ensure that SCr stabilizes.

## 2019-12-10 NOTE — Telephone Encounter (Signed)
-----   Message from [place], [place] sent at 12/09/2019  6:05 PM EDT ----- SCr up slightly w/ addition of spiro but K normal. Repeat BMP again in 1 week to ensure that SCr stabilizes.

## 2019-12-17 ENCOUNTER — Other Ambulatory Visit: Payer: Self-pay

## 2019-12-17 ENCOUNTER — Ambulatory Visit (HOSPITAL_COMMUNITY)
Admission: RE | Admit: 2019-12-17 | Discharge: 2019-12-17 | Disposition: A | Discharge status: Home / Self Care | Payer: Medicare Other | Source: Ambulatory Visit | Attending: Cardiology | Admitting: Cardiology

## 2019-12-17 DIAGNOSIS — I5042 Chronic combined systolic (congestive) and diastolic (congestive) heart failure: Secondary | ICD-10-CM | POA: Diagnosis not present

## 2019-12-17 LAB — BASIC METABOLIC PANEL
Anion gap: 12 (ref 5–15)
BUN: 60 mg/dL — ABNORMAL HIGH (ref 8–23)
CO2: 26 mmol/L (ref 22–32)
Calcium: 10 mg/dL (ref 8.9–10.3)
Chloride: 97 mmol/L — ABNORMAL LOW (ref 98–111)
Creatinine, Ser: 2.69 mg/dL — ABNORMAL HIGH (ref 0.44–1.00)
GFR calc Af Amer: 19 mL/min — ABNORMAL LOW (ref >60)
GFR calc non Af Amer: 16 mL/min — ABNORMAL LOW (ref >60)
Glucose, Bld: 134 mg/dL — ABNORMAL HIGH (ref 70–99)
Potassium: 4.2 mmol/L (ref 3.5–5.1)
Sodium: 135 mmol/L (ref 135–145)

## 2019-12-19 ENCOUNTER — Other Ambulatory Visit (HOSPITAL_COMMUNITY): Payer: Self-pay

## 2019-12-19 NOTE — Progress Notes (Signed)
Paramedicine Encounter    Patient ID: Elaine [name], female    DOB: 1941/01/14, 79 y.o.   MRN: [id number]   Patient Care Team: Marton Redwood, MD as PCP - General (Internal Medicine) Leonie Man, MD as PCP - Cardiology (Cardiology) Constance Haw, MD as PCP - Electrophysiology (Cardiology) Madelon Lips, MD as Consulting Physician (Nephrology) Jorge Ny, LCSW as Social Worker (Licensed Clinical Social Worker)  Patient Active Problem List   Diagnosis Date Noted  . Chronic systolic (congestive) heart failure ([place]) 07/29/2019  . Hypothyroidism 06/16/2019  . Acute on chronic combined systolic and diastolic CHF (congestive heart failure) ([place]) 06/11/2019  . Bilateral leg pain 10/22/2017  . Chronic combined systolic and diastolic CHF, NYHA class 2 ([place]) 10/22/2017  . Edema of both legs 05/10/2015  . Mass of right breast on mammogram 10/06/2014  . DOE (dyspnea on exertion) 08/19/2014  . Severe obesity (BMI >= 40) ([place]) 04/19/2014  . CAD status post CABG 04/19/2014  . Metabolic acidosis 84/13/2440    Class: Acute  . Acute on chronic renal insufficiency 02/02/2014  . Near syncope 02/02/2014  . Hypotension 02/02/2014  . Rash 02/02/2014  . Acute kidney failure ([place]) 02/02/2014    Class: Acute  . Pruritus 02/02/2014    Class: Chronic  . SOB (shortness of breath) 01/07/2014  . Coumadin Rx post CABG 11/17/2013  . Physical deconditioning 11/05/2013  . S/P CABG x 3- 10/23/13 10/23/2013  . Permanent atrial fibrillation (HCC) - CHA2DS2-VASc Score 6, on Eliquis 10/20/2013  . Left bundle branch block (LBBB) on electrocardiogram 10/20/2013  . Left main coronary artery disease 10/20/2013    Class: Hospitalized for  . History of acute anterior wall myocardial infarction 10/20/2013  . Ischemic cardiomyopathy 10/20/2013  . Morbid obesity- BMI 48   . Diabetic neuropathy ([place])   . Acute kidney injury superimposed on chronic kidney disease ([place])   . Obesity 12/20/2006  . Type II  diabetes mellitus with complication ([place]) 06/24/2535  . Hyperlipidemia with target LDL less than 70 08/06/2006  . Essential hypertension 08/06/2006  . OSTEOARTHRITIS 08/06/2006    Current Outpatient Medications:  .  acetaminophen (TYLENOL) 650 MG CR tablet, Take 1,300 mg by mouth 2 (two) times daily., Disp: , Rfl:  .  albuterol (PROAIR HFA) 108 (90 Base) MCG/ACT inhaler, Inhale 2 puffs into the lungs every 6 (six) hours as needed for wheezing or shortness of breath., Disp: , Rfl:  .  apixaban (ELIQUIS) 5 MG TABS tablet, Take 1 tablet (5 mg total) by mouth 2 (two) times daily. Resume Saturday evening., Disp: 60 tablet, Rfl:  .  Ascorbic Acid (VITAMIN C) 1000 MG tablet, Take 2,000 mg by mouth daily., Disp: , Rfl:  .  AZO-CRANBERRY PO, Take 1 tablet by mouth 2 (two) times daily. , Disp: , Rfl:  .  carboxymethylcellulose (REFRESH PLUS) 0.5 % SOLN, Place 1 drop into the left eye daily., Disp: , Rfl:  .  Coenzyme Q10 (COQ-10) 100 MG CAPS, Take 100 mg by mouth daily., Disp: , Rfl:  .  colchicine 0.6 MG tablet, Take 0.6 mg by mouth daily., Disp: , Rfl:  .  Dulaglutide (TRULICITY) 6.44 IH/4.7QQ SOPN, Inject 0.75 mg into the skin every Sunday at 6pm. , Disp: , Rfl:  .  ezetimibe (ZETIA) 10 MG tablet, Take 10 mg by mouth daily., Disp: , Rfl:  .  ferrous sulfate 325 (65 FE) MG tablet, Take 325 mg by mouth every other day., Disp: , Rfl:  .  fluticasone (  CUTIVATE) 0.05 % cream, APPLY TO AFFECTED AREAS (FACE AND EARS) UP TO TWICE A DAY OR AS NEEDED, Disp: , Rfl:  .  ipratropium-albuterol (DUONEB) 0.5-2.5 (3) MG/3ML SOLN, Take 3 mLs by nebulization every 4 (four) hours as needed (for shortness of breath). , Disp: , Rfl:  .  isosorbide-hydrALAZINE (BIDIL) 20-37.5 MG tablet, Take 0.5 tablets by mouth 3 (three) times daily., Disp: 45 tablet, Rfl: 5 .  levothyroxine (SYNTHROID) 112 MCG tablet, Take 1 tablet (112 mcg total) by mouth daily before breakfast., Disp: 30 tablet, Rfl: 12 .  metoprolol succinate  (TOPROL-XL) 100 MG 24 hr tablet, Take 1 tablet (100 mg total) by mouth daily. Take with or immediately following a meal., Disp: 30 tablet, Rfl: 2 .  Misc Natural Products (JOINT HEALTH PO), Take 30 mLs by mouth daily., Disp: , Rfl:  .  Multiple Vitamin (MULTIVITAMIN WITH MINERALS) TABS tablet, Take 1 tablet by mouth daily., Disp: , Rfl:  .  Multiple Vitamins-Minerals (PRESERVISION AREDS 2) CAPS, Take 1 capsule by mouth 2 (two) times daily., Disp: , Rfl:  .  prednisoLONE acetate (PRED FORTE) 1 % ophthalmic suspension, Place 1 drop into the right eye daily., Disp: , Rfl:  .  rosuvastatin (CRESTOR) 5 MG tablet, Take 5 mg by mouth See admin instructions. Take one tablet (5 mg) by mouth at night on Sunday, Tuesday, Thursday, Saturday, Disp: , Rfl:  .  sodium chloride (MURO 128) 2 % ophthalmic solution, Place 1 drop into the right eye daily., Disp: , Rfl:  .  spironolactone (ALDACTONE) 25 MG tablet, Take 0.5 tablets (12.5 mg total) by mouth daily., Disp: 45 tablet, Rfl: 3 .  torsemide (DEMADEX) 20 MG tablet, Take 3 tablets (60 mg total) by mouth 2 (two) times daily., Disp: 540 tablet, Rfl: 2 .  triamcinolone cream (KENALOG) 0.1 %, APPLY TO THE AFFECTED AREA UP TO TWICE DAILY OR AS NEEDED (NO TO FACE GROIN AXILLA), Disp: , Rfl:  .  umeclidinium-vilanterol (ANORO ELLIPTA) 62.5-25 MCG/INH AEPB, Inhale 1 puff into the lungs daily., Disp: , Rfl:  Allergies  Allergen Reactions  . Crestor [Rosuvastatin Calcium] Other (See Comments)    Cramps- still taking   . Allopurinol Hives  . Tape Rash    Adhesive tape      Social History   Socioeconomic History  . Marital status: Widowed    Spouse name: Not on file  . Number of children: Not on file  . Years of education: Not on file  . Highest education level: Not on file  Occupational History  . Occupation: retired  Tobacco Use  . Smoking status: Former Smoker    Packs/day: 0.50    Years: 39.00    Pack years: 19.50    Types: Cigarettes    Quit date:  08/28/1993    Years since quitting: 26.3  . Smokeless tobacco: Never Used  Substance and Sexual Activity  . Alcohol use: No    Comment: quit 95  . Drug use: No  . Sexual activity: Not on file  Other Topics Concern  . Not on file  Social History Narrative   Lives with husband and son in [place].  She is currently retired.   Former smoker quit in 1995.   Social Determinants of Health   Financial Resource Strain: Medium Risk  . Difficulty of Paying Living Expenses: Somewhat hard  Food Insecurity: No Food Insecurity  . Worried About Charity fundraiser in the Last Year: Never true  . Ran Out of  Food in the Last Year: Never true  Transportation Needs: No Transportation Needs  . Lack of Transportation (Medical): No  . Lack of Transportation (Non-Medical): No  Physical Activity:   . Days of Exercise per Week:   . Minutes of Exercise per Session:   Stress:   . Feeling of Stress :   Social Connections:   . Frequency of Communication with Friends and Family:   . Frequency of Social Gatherings with Friends and Family:   . Attends Religious Services:   . Active Member of Clubs or Organizations:   . Attends Archivist Meetings:   [name] Marital Status:   Intimate Partner Violence:   . Fear of Current or Ex-Partner:   . Emotionally Abused:   [name] Physically Abused:   . Sexually Abused:     Physical Exam Cardiovascular:     Rate and Rhythm: Normal rate and regular rhythm.     Pulses: Normal pulses.  Pulmonary:     Effort: Pulmonary effort is normal.     Breath sounds: Normal breath sounds.  Musculoskeletal:        General: Normal range of motion.     Right lower leg: No edema.     Left lower leg: No edema.  Skin:    General: Skin is warm and dry.     Capillary Refill: Capillary refill takes less than 2 seconds.  Neurological:     Mental Status: She is alert and oriented to person, place, and time.  Psychiatric:        Mood and Affect: Mood normal.          Future Appointments  Date Time Provider [place]  12/29/2019 10:00 AM [facility] DEVICE REMOTES [facility]  01/06/2020 11:30 AM [facility] PA/NP [facility] None  01/13/2020  2:20 PM Bensimhon, Shaune Pascal, MD [facility] None  01/20/2020  9:00 AM Leonie Man, MD [facility]  01/27/2020  7:00 AM [facility] DEVICE REMOTES [facility]  04/27/2020  7:00 AM [facility] DEVICE REMOTES [facility]    BP 100/60 (BP Location: Left Arm, Patient Position: Sitting, Cuff Size: Normal)   Pulse 84   Resp 16   Wt 225 lb 1.6 oz (102.1 kg)   SpO2 98%   BMI 41.17 kg/m   Weight yesterday- 229.7 lb Last visit weight- 230 lb  Ms Fangman was seen at home today and reported feeling well. She denied chest pain, SOB, headache, dizziness, orthopnea fever or cough since our last visit. She stated she has been compliant with her medications and her weight has been stable. Her medications were verified and she continues to fill her own pillbox. I will follow up next week.   Jacquiline Doe, EMT 12/19/19  ACTION: Home visit completed Next visit planned for 1 week

## 2019-12-24 ENCOUNTER — Telehealth (HOSPITAL_COMMUNITY): Payer: Self-pay | Admitting: Licensed Clinical Social Worker

## 2019-12-24 NOTE — Telephone Encounter (Signed)
CSW called pt to check in regarding PCS referral.  Per patient she was contacted by [employer] and informed that she did not have the right type of Medicaid to qualify her for [facility] services.  Pt reports she is doing ok though and able to manage things at home with the help of her children- she is trying to be more accepting of getting help from them and per paramedic there has been someone out with her everytime he visits and she appears to be well taken care of.  Pt expresses no further concerns at this time- CSW will continue to follow and assist as needed  Jorge Ny, [facility] Desk#: [phone] Cell#: [phone]

## 2019-12-26 ENCOUNTER — Other Ambulatory Visit (HOSPITAL_COMMUNITY): Payer: Self-pay

## 2019-12-26 NOTE — Progress Notes (Signed)
Paramedicine Encounter    Patient ID: Elaine [name], female    DOB: 23-Feb-1941, 79 y.o.   MRN: [id number]   Patient Care Team: Marton Redwood, MD as PCP - General (Internal Medicine) Leonie Man, MD as PCP - Cardiology (Cardiology) Constance Haw, MD as PCP - Electrophysiology (Cardiology) Madelon Lips, MD as Consulting Physician (Nephrology) Jorge Ny, LCSW as Social Worker (Licensed Clinical Social Worker)  Patient Active Problem List   Diagnosis Date Noted  . Chronic systolic (congestive) heart failure ([place]) 07/29/2019  . Hypothyroidism 06/16/2019  . Acute on chronic combined systolic and diastolic CHF (congestive heart failure) ([place]) 06/11/2019  . Bilateral leg pain 10/22/2017  . Chronic combined systolic and diastolic CHF, NYHA class 2 ([place]) 10/22/2017  . Edema of both legs 05/10/2015  . Mass of right breast on mammogram 10/06/2014  . DOE (dyspnea on exertion) 08/19/2014  . Severe obesity (BMI >= 40) ([place]) 04/19/2014  . CAD status post CABG 04/19/2014  . Metabolic acidosis 88/50/2774    Class: Acute  . Acute on chronic renal insufficiency 02/02/2014  . Near syncope 02/02/2014  . Hypotension 02/02/2014  . Rash 02/02/2014  . Acute kidney failure ([place]) 02/02/2014    Class: Acute  . Pruritus 02/02/2014    Class: Chronic  . SOB (shortness of breath) 01/07/2014  . Coumadin Rx post CABG 11/17/2013  . Physical deconditioning 11/05/2013  . S/P CABG x 3- 10/23/13 10/23/2013  . Permanent atrial fibrillation (HCC) - CHA2DS2-VASc Score 6, on Eliquis 10/20/2013  . Left bundle branch block (LBBB) on electrocardiogram 10/20/2013  . Left main coronary artery disease 10/20/2013    Class: Hospitalized for  . History of acute anterior wall myocardial infarction 10/20/2013  . Ischemic cardiomyopathy 10/20/2013  . Morbid obesity- BMI 48   . Diabetic neuropathy ([place])   . Acute kidney injury superimposed on chronic kidney disease ([place])   . Obesity 12/20/2006  . Type II  diabetes mellitus with complication ([place]) 12/87/8676  . Hyperlipidemia with target LDL less than 70 08/06/2006  . Essential hypertension 08/06/2006  . OSTEOARTHRITIS 08/06/2006    Current Outpatient Medications:  .  acetaminophen (TYLENOL) 650 MG CR tablet, Take 1,300 mg by mouth 2 (two) times daily., Disp: , Rfl:  .  albuterol (PROAIR HFA) 108 (90 Base) MCG/ACT inhaler, Inhale 2 puffs into the lungs every 6 (six) hours as needed for wheezing or shortness of breath., Disp: , Rfl:  .  apixaban (ELIQUIS) 5 MG TABS tablet, Take 1 tablet (5 mg total) by mouth 2 (two) times daily. Resume Saturday evening., Disp: 60 tablet, Rfl:  .  Ascorbic Acid (VITAMIN C) 1000 MG tablet, Take 2,000 mg by mouth daily., Disp: , Rfl:  .  AZO-CRANBERRY PO, Take 1 tablet by mouth 2 (two) times daily. , Disp: , Rfl:  .  carboxymethylcellulose (REFRESH PLUS) 0.5 % SOLN, Place 1 drop into the left eye daily., Disp: , Rfl:  .  Coenzyme Q10 (COQ-10) 100 MG CAPS, Take 100 mg by mouth daily., Disp: , Rfl:  .  colchicine 0.6 MG tablet, Take 0.6 mg by mouth daily., Disp: , Rfl:  .  Dulaglutide (TRULICITY) 7.20 NO/7.0JG SOPN, Inject 0.75 mg into the skin every Sunday at 6pm. , Disp: , Rfl:  .  ezetimibe (ZETIA) 10 MG tablet, Take 10 mg by mouth daily., Disp: , Rfl:  .  ferrous sulfate 325 (65 FE) MG tablet, Take 325 mg by mouth every other day., Disp: , Rfl:  .  fluticasone (  CUTIVATE) 0.05 % cream, APPLY TO AFFECTED AREAS (FACE AND EARS) UP TO TWICE A DAY OR AS NEEDED, Disp: , Rfl:  .  ipratropium-albuterol (DUONEB) 0.5-2.5 (3) MG/3ML SOLN, Take 3 mLs by nebulization every 4 (four) hours as needed (for shortness of breath). , Disp: , Rfl:  .  isosorbide-hydrALAZINE (BIDIL) 20-37.5 MG tablet, Take 0.5 tablets by mouth 3 (three) times daily., Disp: 45 tablet, Rfl: 5 .  levothyroxine (SYNTHROID) 112 MCG tablet, Take 1 tablet (112 mcg total) by mouth daily before breakfast., Disp: 30 tablet, Rfl: 12 .  metoprolol succinate  (TOPROL-XL) 100 MG 24 hr tablet, Take 1 tablet (100 mg total) by mouth daily. Take with or immediately following a meal., Disp: 30 tablet, Rfl: 2 .  Misc Natural Products (JOINT HEALTH PO), Take 30 mLs by mouth daily., Disp: , Rfl:  .  Multiple Vitamin (MULTIVITAMIN WITH MINERALS) TABS tablet, Take 1 tablet by mouth daily., Disp: , Rfl:  .  Multiple Vitamins-Minerals (PRESERVISION AREDS 2) CAPS, Take 1 capsule by mouth 2 (two) times daily., Disp: , Rfl:  .  prednisoLONE acetate (PRED FORTE) 1 % ophthalmic suspension, Place 1 drop into the right eye daily., Disp: , Rfl:  .  rosuvastatin (CRESTOR) 5 MG tablet, Take 5 mg by mouth See admin instructions. Take one tablet (5 mg) by mouth at night on Sunday, Tuesday, Thursday, Saturday, Disp: , Rfl:  .  sodium chloride (MURO 128) 2 % ophthalmic solution, Place 1 drop into the right eye daily., Disp: , Rfl:  .  spironolactone (ALDACTONE) 25 MG tablet, Take 0.5 tablets (12.5 mg total) by mouth daily., Disp: 45 tablet, Rfl: 3 .  torsemide (DEMADEX) 20 MG tablet, Take 3 tablets (60 mg total) by mouth 2 (two) times daily., Disp: 540 tablet, Rfl: 2 .  triamcinolone cream (KENALOG) 0.1 %, APPLY TO THE AFFECTED AREA UP TO TWICE DAILY OR AS NEEDED (NO TO FACE GROIN AXILLA), Disp: , Rfl:  .  umeclidinium-vilanterol (ANORO ELLIPTA) 62.5-25 MCG/INH AEPB, Inhale 1 puff into the lungs daily., Disp: , Rfl:  Allergies  Allergen Reactions  . Crestor [Rosuvastatin Calcium] Other (See Comments)    Cramps- still taking   . Allopurinol Hives  . Tape Rash    Adhesive tape      Social History   Socioeconomic History  . Marital status: Widowed    Spouse name: Not on file  . Number of children: Not on file  . Years of education: Not on file  . Highest education level: Not on file  Occupational History  . Occupation: retired  Tobacco Use  . Smoking status: Former Smoker    Packs/day: 0.50    Years: 39.00    Pack years: 19.50    Types: Cigarettes    Quit date:  08/28/1993    Years since quitting: 26.3  . Smokeless tobacco: Never Used  Substance and Sexual Activity  . Alcohol use: No    Comment: quit 95  . Drug use: No  . Sexual activity: Not on file  Other Topics Concern  . Not on file  Social History Narrative   Lives with husband and son in [place].  She is currently retired.   Former smoker quit in 1995.   Social Determinants of Health   Financial Resource Strain: Medium Risk  . Difficulty of Paying Living Expenses: Somewhat hard  Food Insecurity: No Food Insecurity  . Worried About Charity fundraiser in the Last Year: Never true  . Ran Out of  Food in the Last Year: Never true  Transportation Needs: No Transportation Needs  . Lack of Transportation (Medical): No  . Lack of Transportation (Non-Medical): No  Physical Activity:   . Days of Exercise per Week:   . Minutes of Exercise per Session:   Stress:   . Feeling of Stress :   Social Connections:   . Frequency of Communication with Friends and Family:   . Frequency of Social Gatherings with Friends and Family:   . Attends Religious Services:   . Active Member of Clubs or Organizations:   . Attends Archivist Meetings:   [name] Marital Status:   Intimate Partner Violence:   . Fear of Current or Ex-Partner:   . Emotionally Abused:   [name] Physically Abused:   . Sexually Abused:     Physical Exam Cardiovascular:     Rate and Rhythm: Normal rate and regular rhythm.     Pulses: Normal pulses.  Pulmonary:     Effort: Pulmonary effort is normal.     Breath sounds: Normal breath sounds.  Musculoskeletal:        General: Normal range of motion.     Right lower leg: No edema.     Left lower leg: No edema.  Skin:    General: Skin is warm and dry.     Capillary Refill: Capillary refill takes less than 2 seconds.  Neurological:     Mental Status: She is alert and oriented to person, place, and time.  Psychiatric:        Mood and Affect: Mood normal.          Future Appointments  Date Time Provider [place]  12/29/2019 10:00 AM [facility] DEVICE REMOTES [facility]  01/06/2020 11:30 AM [facility] PA/NP [facility] None  01/20/2020  9:00 AM Leonie Man, MD [facility]  01/27/2020  7:00 AM [facility] DEVICE REMOTES [facility]  04/27/2020  7:00 AM [facility] DEVICE REMOTES [facility]    BP 106/68 (BP Location: Right Arm, Patient Position: Sitting, Cuff Size: Large)   Pulse 84   Resp 16   Wt 223 lb 8 oz (101.4 kg)   SpO2 97%   BMI 40.88 kg/m   Weight yesterday- 223.1 lb Last visit weight- 225 lb  Elaine [name] was seen at home today and reported feeling well. She denied chest pain, SOB, headache, dizziness, orthopnea, fever or cough over the past week. She stated she has been compliant with her medications over the past week and her weight has been stable. Her medications were verified and her pillbox was checked for accuracy. I will follow up next week.   Jacquiline Doe, EMT 12/26/19  ACTION: Home visit completed Next visit planned for 1 week

## 2019-12-29 ENCOUNTER — Ambulatory Visit (INDEPENDENT_AMBULATORY_CARE_PROVIDER_SITE_OTHER): Payer: Medicare Other

## 2019-12-29 DIAGNOSIS — Z95810 Presence of automatic (implantable) cardiac defibrillator: Secondary | ICD-10-CM

## 2019-12-29 DIAGNOSIS — I5042 Chronic combined systolic (congestive) and diastolic (congestive) heart failure: Secondary | ICD-10-CM | POA: Diagnosis not present

## 2019-12-30 NOTE — Progress Notes (Signed)
EPIC Encounter for ICM Monitoring  Patient Name: Elaine [name] is a 79 y.o. female Date: 12/30/2019 Primary Care Physican: Marton Redwood, MD Primary Cardiologist: [place] Electrophysiologist: Curt Bears Bi-V Pacing: 98%            12/30/2019 Weight: 223.8 lbs                                                            Spoke with patient.  Heart Failure questions reviewed and reports she is feeling well.   CorVue thoracic impedance normal.  Prescribed:  Torsemide 20 mg take 60 mg twice a day   Labs:  11/20/2019 Creatinine 2.24, BUN 52, Potassium 3.7, Sodium 141, GFR 20-23 11/11/2019 Creatinine 2.44, BUN 51, Potassium 3.6, Sodium 142, GFR 18-21 10/20/2019 Creatinine 2.99, BUN 66, Potassium 4.5, Sodium 137, GFR 14-17 09/26/2019 Creatinine 2.08, BUN 71, Potassium 4.0, Sodium 137, GFR 22-26  Recommendations:  No changes and encouraged to call if experiencing any fluid symptoms.    Follow-up plan: ICM clinic phone appointment on 02/02/2020.   91 day device clinic remote transmission 01/27/2020.  Office appt 01/06/2020 with [facility] Clinic PA/NP and Dr Ellyn Hack 01/20/2020.    Copy of ICM check sent to Dr. Curt Bears.   3 month ICM 5rend: 12/29/2019    1 Year ICM trend:       [name], RN 12/30/2019 1:17 PM

## 2020-01-02 ENCOUNTER — Other Ambulatory Visit (HOSPITAL_COMMUNITY): Payer: Self-pay

## 2020-01-02 NOTE — Progress Notes (Signed)
Paramedicine Encounter    Patient ID: Elaine [name], female    DOB: 09-29-1940, 79 y.o.   MRN: [id number]   Patient Care Team: Marton Redwood, MD as PCP - General (Internal Medicine) Leonie Man, MD as PCP - Cardiology (Cardiology) Constance Haw, MD as PCP - Electrophysiology (Cardiology) Madelon Lips, MD as Consulting Physician (Nephrology) Jorge Ny, LCSW as Social Worker (Licensed Clinical Social Worker)  Patient Active Problem List   Diagnosis Date Noted  . Chronic systolic (congestive) heart failure ([employer]) 07/29/2019  . Hypothyroidism 06/16/2019  . Acute on chronic combined systolic and diastolic CHF (congestive heart failure) ([place]) 06/11/2019  . Bilateral leg pain 10/22/2017  . Chronic combined systolic and diastolic CHF, NYHA class 2 ([place]) 10/22/2017  . Edema of both legs 05/10/2015  . Mass of right breast on mammogram 10/06/2014  . DOE (dyspnea on exertion) 08/19/2014  . Severe obesity (BMI >= 40) ([place]) 04/19/2014  . CAD status post CABG 04/19/2014  . Metabolic acidosis 38/46/6599    Class: Acute  . Acute on chronic renal insufficiency 02/02/2014  . Near syncope 02/02/2014  . Hypotension 02/02/2014  . Rash 02/02/2014  . Acute kidney failure ([place]) 02/02/2014    Class: Acute  . Pruritus 02/02/2014    Class: Chronic  . SOB (shortness of breath) 01/07/2014  . Coumadin Rx post CABG 11/17/2013  . Physical deconditioning 11/05/2013  . S/P CABG x 3- 10/23/13 10/23/2013  . Permanent atrial fibrillation (HCC) - CHA2DS2-VASc Score 6, on Eliquis 10/20/2013  . Left bundle branch block (LBBB) on electrocardiogram 10/20/2013  . Left main coronary artery disease 10/20/2013    Class: Hospitalized for  . History of acute anterior wall myocardial infarction 10/20/2013  . Ischemic cardiomyopathy 10/20/2013  . Morbid obesity- BMI 48   . Diabetic neuropathy ([place])   . Acute kidney injury superimposed on chronic kidney disease ([place])   . Obesity 12/20/2006  . Type II  diabetes mellitus with complication ([place]) 35/70/1779  . Hyperlipidemia with target LDL less than 70 08/06/2006  . Essential hypertension 08/06/2006  . OSTEOARTHRITIS 08/06/2006    Current Outpatient Medications:  .  acetaminophen (TYLENOL) 650 MG CR tablet, Take 1,300 mg by mouth 2 (two) times daily., Disp: , Rfl:  .  albuterol (PROAIR HFA) 108 (90 Base) MCG/ACT inhaler, Inhale 2 puffs into the lungs every 6 (six) hours as needed for wheezing or shortness of breath., Disp: , Rfl:  .  apixaban (ELIQUIS) 5 MG TABS tablet, Take 1 tablet (5 mg total) by mouth 2 (two) times daily. Resume Saturday evening., Disp: 60 tablet, Rfl:  .  Ascorbic Acid (VITAMIN C) 1000 MG tablet, Take 2,000 mg by mouth daily., Disp: , Rfl:  .  AZO-CRANBERRY PO, Take 1 tablet by mouth 2 (two) times daily. , Disp: , Rfl:  .  carboxymethylcellulose (REFRESH PLUS) 0.5 % SOLN, Place 1 drop into the left eye daily., Disp: , Rfl:  .  Coenzyme Q10 (COQ-10) 100 MG CAPS, Take 100 mg by mouth daily., Disp: , Rfl:  .  colchicine 0.6 MG tablet, Take 0.6 mg by mouth daily., Disp: , Rfl:  .  Dulaglutide (TRULICITY) 3.90 ZE/0.9QZ SOPN, Inject 0.75 mg into the skin every Sunday at 6pm. , Disp: , Rfl:  .  ezetimibe (ZETIA) 10 MG tablet, Take 10 mg by mouth daily., Disp: , Rfl:  .  ferrous sulfate 325 (65 FE) MG tablet, Take 325 mg by mouth every other day., Disp: , Rfl:  .  fluticasone (  CUTIVATE) 0.05 % cream, APPLY TO AFFECTED AREAS (FACE AND EARS) UP TO TWICE A DAY OR AS NEEDED, Disp: , Rfl:  .  ipratropium-albuterol (DUONEB) 0.5-2.5 (3) MG/3ML SOLN, Take 3 mLs by nebulization every 4 (four) hours as needed (for shortness of breath). , Disp: , Rfl:  .  isosorbide-hydrALAZINE (BIDIL) 20-37.5 MG tablet, Take 0.5 tablets by mouth 3 (three) times daily., Disp: 45 tablet, Rfl: 5 .  levothyroxine (SYNTHROID) 112 MCG tablet, Take 1 tablet (112 mcg total) by mouth daily before breakfast., Disp: 30 tablet, Rfl: 12 .  metoprolol succinate  (TOPROL-XL) 100 MG 24 hr tablet, Take 1 tablet (100 mg total) by mouth daily. Take with or immediately following a meal., Disp: 30 tablet, Rfl: 2 .  Misc Natural Products (JOINT HEALTH PO), Take 30 mLs by mouth daily., Disp: , Rfl:  .  Multiple Vitamin (MULTIVITAMIN WITH MINERALS) TABS tablet, Take 1 tablet by mouth daily., Disp: , Rfl:  .  Multiple Vitamins-Minerals (PRESERVISION AREDS 2) CAPS, Take 1 capsule by mouth 2 (two) times daily., Disp: , Rfl:  .  prednisoLONE acetate (PRED FORTE) 1 % ophthalmic suspension, Place 1 drop into the right eye daily., Disp: , Rfl:  .  rosuvastatin (CRESTOR) 5 MG tablet, Take 5 mg by mouth See admin instructions. Take one tablet (5 mg) by mouth at night on Sunday, Tuesday, Thursday, Saturday, Disp: , Rfl:  .  sodium chloride (MURO 128) 2 % ophthalmic solution, Place 1 drop into the right eye daily., Disp: , Rfl:  .  spironolactone (ALDACTONE) 25 MG tablet, Take 0.5 tablets (12.5 mg total) by mouth daily., Disp: 45 tablet, Rfl: 3 .  torsemide (DEMADEX) 20 MG tablet, Take 3 tablets (60 mg total) by mouth 2 (two) times daily., Disp: 540 tablet, Rfl: 2 .  triamcinolone cream (KENALOG) 0.1 %, APPLY TO THE AFFECTED AREA UP TO TWICE DAILY OR AS NEEDED (NO TO FACE GROIN AXILLA), Disp: , Rfl:  .  umeclidinium-vilanterol (ANORO ELLIPTA) 62.5-25 MCG/INH AEPB, Inhale 1 puff into the lungs daily., Disp: , Rfl:  Allergies  Allergen Reactions  . Crestor [Rosuvastatin Calcium] Other (See Comments)    Cramps- still taking   . Allopurinol Hives  . Tape Rash    Adhesive tape      Social History   Socioeconomic History  . Marital status: Widowed    Spouse name: Not on file  . Number of children: Not on file  . Years of education: Not on file  . Highest education level: Not on file  Occupational History  . Occupation: retired  Tobacco Use  . Smoking status: Former Smoker    Packs/day: 0.50    Years: 39.00    Pack years: 19.50    Types: Cigarettes    Quit date:  08/28/1993    Years since quitting: 26.3  . Smokeless tobacco: Never Used  Substance and Sexual Activity  . Alcohol use: No    Comment: quit 95  . Drug use: No  . Sexual activity: Not on file  Other Topics Concern  . Not on file  Social History Narrative   Lives with husband and son in [place].  She is currently retired.   Former smoker quit in 1995.   Social Determinants of Health   Financial Resource Strain: Medium Risk  . Difficulty of Paying Living Expenses: Somewhat hard  Food Insecurity: No Food Insecurity  . Worried About Charity fundraiser in the Last Year: Never true  . Ran Out of  Food in the Last Year: Never true  Transportation Needs: No Transportation Needs  . Lack of Transportation (Medical): No  . Lack of Transportation (Non-Medical): No  Physical Activity:   . Days of Exercise per Week:   . Minutes of Exercise per Session:   Stress:   . Feeling of Stress :   Social Connections:   . Frequency of Communication with Friends and Family:   . Frequency of Social Gatherings with Friends and Family:   . Attends Religious Services:   . Active Member of Clubs or Organizations:   . Attends Archivist Meetings:   [name] Marital Status:   Intimate Partner Violence:   . Fear of Current or Ex-Partner:   . Emotionally Abused:   [name] Physically Abused:   . Sexually Abused:     Physical Exam Cardiovascular:     Rate and Rhythm: Normal rate and regular rhythm.     Pulses: Normal pulses.  Pulmonary:     Effort: Pulmonary effort is normal.     Breath sounds: Normal breath sounds.  Musculoskeletal:        General: Normal range of motion.     Right lower leg: No edema.     Left lower leg: No edema.  Skin:    General: Skin is warm and dry.     Capillary Refill: Capillary refill takes less than 2 seconds.  Neurological:     Mental Status: She is alert and oriented to person, place, and time.  Psychiatric:        Mood and Affect: Mood normal.          Future Appointments  Date Time Provider [place]  01/06/2020 11:30 AM [facility] PA/NP [facility] None  01/20/2020  9:00 AM Leonie Man, MD [facility]  01/27/2020  7:00 AM [facility] DEVICE REMOTES [facility]  02/02/2020 10:50 AM [facility] DEVICE REMOTES [facility]  04/27/2020  7:00 AM [facility] DEVICE REMOTES [facility]    BP 104/68 (BP Location: Right Arm, Patient Position: Sitting, Cuff Size: Large)   Pulse 85   Resp 16   Wt 222 lb 6.4 oz (100.9 kg)   SpO2 97%   BMI 40.68 kg/m   Weight yesterday- 222.4 lb Last visit weight- 223.5 lb  Ms Tally was seen at home today and reported feeling well. She denied chest pain, SOB, headache, dizziness, orthopnea, fever or cough. She stated she has been compliant with her medications and her weight has been stable. Her medications were verified and her pillbox was checked for accuracy. I will follow up next week.   Jacquiline Doe, EMT 01/02/20  ACTION: Home visit completed Next visit planned for 1 week

## 2020-01-06 ENCOUNTER — Ambulatory Visit (HOSPITAL_COMMUNITY)
Admission: RE | Admit: 2020-01-06 | Discharge: 2020-01-06 | Disposition: A | Discharge status: Home / Self Care | Payer: Medicare Other | Source: Ambulatory Visit | Attending: Cardiology | Admitting: Cardiology

## 2020-01-06 ENCOUNTER — Encounter (HOSPITAL_COMMUNITY): Payer: Self-pay

## 2020-01-06 ENCOUNTER — Other Ambulatory Visit: Payer: Self-pay

## 2020-01-06 VITALS — BP 128/86 | HR 85 | Wt 225.6 lb

## 2020-01-06 DIAGNOSIS — Z7901 Long term (current) use of anticoagulants: Secondary | ICD-10-CM | POA: Insufficient documentation

## 2020-01-06 DIAGNOSIS — Z87891 Personal history of nicotine dependence: Secondary | ICD-10-CM | POA: Insufficient documentation

## 2020-01-06 DIAGNOSIS — I5022 Chronic systolic (congestive) heart failure: Secondary | ICD-10-CM | POA: Diagnosis present

## 2020-01-06 DIAGNOSIS — Z794 Long term (current) use of insulin: Secondary | ICD-10-CM | POA: Diagnosis not present

## 2020-01-06 DIAGNOSIS — I5042 Chronic combined systolic (congestive) and diastolic (congestive) heart failure: Secondary | ICD-10-CM | POA: Diagnosis not present

## 2020-01-06 DIAGNOSIS — Z95810 Presence of automatic (implantable) cardiac defibrillator: Secondary | ICD-10-CM | POA: Insufficient documentation

## 2020-01-06 DIAGNOSIS — Z951 Presence of aortocoronary bypass graft: Secondary | ICD-10-CM | POA: Diagnosis not present

## 2020-01-06 DIAGNOSIS — I482 Chronic atrial fibrillation, unspecified: Secondary | ICD-10-CM | POA: Insufficient documentation

## 2020-01-06 DIAGNOSIS — Z79899 Other long term (current) drug therapy: Secondary | ICD-10-CM | POA: Insufficient documentation

## 2020-01-06 DIAGNOSIS — I255 Ischemic cardiomyopathy: Secondary | ICD-10-CM | POA: Diagnosis not present

## 2020-01-06 DIAGNOSIS — E1122 Type 2 diabetes mellitus with diabetic chronic kidney disease: Secondary | ICD-10-CM | POA: Insufficient documentation

## 2020-01-06 DIAGNOSIS — I2510 Atherosclerotic heart disease of native coronary artery without angina pectoris: Secondary | ICD-10-CM | POA: Diagnosis not present

## 2020-01-06 DIAGNOSIS — Z8249 Family history of ischemic heart disease and other diseases of the circulatory system: Secondary | ICD-10-CM | POA: Insufficient documentation

## 2020-01-06 DIAGNOSIS — J449 Chronic obstructive pulmonary disease, unspecified: Secondary | ICD-10-CM | POA: Diagnosis not present

## 2020-01-06 DIAGNOSIS — N183 Chronic kidney disease, stage 3 unspecified: Secondary | ICD-10-CM | POA: Diagnosis not present

## 2020-01-06 DIAGNOSIS — I130 Hypertensive heart and chronic kidney disease with heart failure and stage 1 through stage 4 chronic kidney disease, or unspecified chronic kidney disease: Secondary | ICD-10-CM | POA: Diagnosis not present

## 2020-01-06 LAB — COMPREHENSIVE METABOLIC PANEL
ALT: 18 U/L (ref 0–44)
AST: 24 U/L (ref 15–41)
Albumin: 3.7 g/dL (ref 3.5–5.0)
Alkaline Phosphatase: 78 U/L (ref 38–126)
Anion gap: 16 — ABNORMAL HIGH (ref 5–15)
BUN: 60 mg/dL — ABNORMAL HIGH (ref 8–23)
CO2: 24 mmol/L (ref 22–32)
Calcium: 9.9 mg/dL (ref 8.9–10.3)
Chloride: 96 mmol/L — ABNORMAL LOW (ref 98–111)
Creatinine, Ser: 2.46 mg/dL — ABNORMAL HIGH (ref 0.44–1.00)
GFR calc Af Amer: 21 mL/min — ABNORMAL LOW (ref >60)
GFR calc non Af Amer: 18 mL/min — ABNORMAL LOW (ref >60)
Glucose, Bld: 116 mg/dL — ABNORMAL HIGH (ref 70–99)
Potassium: 3.6 mmol/L (ref 3.5–5.1)
Sodium: 136 mmol/L (ref 135–145)
Total Bilirubin: 1.4 mg/dL — ABNORMAL HIGH (ref 0.3–1.2)
Total Protein: 7.4 g/dL (ref 6.5–8.1)

## 2020-01-06 LAB — CBC
HCT: 37 % (ref 36.0–46.0)
Hemoglobin: 12 g/dL (ref 12.0–15.0)
MCH: 29.6 pg (ref 26.0–34.0)
MCHC: 32.4 g/dL (ref 30.0–36.0)
MCV: 91.4 fL (ref 80.0–100.0)
Platelets: 194 10*3/uL (ref 150–400)
RBC: 4.05 MIL/uL (ref 3.87–5.11)
RDW: 14.5 % (ref 11.5–15.5)
WBC: 6.2 10*3/uL (ref 4.0–10.5)
nRBC: 0 % (ref 0.0–0.2)

## 2020-01-06 NOTE — Patient Instructions (Signed)
STOP Spironolactone  Routine lab work today. Will notify you of abnormal results  Follow up with Dr.McLean in 3 months

## 2020-01-06 NOTE — Progress Notes (Signed)
PCP: Marton Redwood, MD Cardiology: Dr. Ellyn Hack HF Cardiology: Dr. Aundra Dubin  Reason for Visit: F/u for Chronic Systolic Heart Failure  79 y.o. with history of CAD s/p CABG, COPD, CKD stage 3, and chronic systolic systolic CHF/ischemic cardiomyopathy was referred by Dr. Ellyn Hack for evaluation of CHF.  Patient was admitted in 2/15 with atrial fibrillation/RVR and chest pain.  Cath showed severe 3VD and she had CABG x 3.  Echo in 2/15 showed EF down to 20-25%. In 2018, it appears that she went back into atrial fibrillation and has remained in atrial fibrillation persistently.  DCCV was not attempted.   Most recent echo in 10/20 showed EF < 20% with normal RV.  She had a St Jude CRT-D device implanted in 11/20.  She says that she feels "90%" better with CRT.  She is followed by Dr. Hollie Salk with nephrology.   She has been in atrial fibrillation persistently. Dr. Aundra Dubin recently attempted DCCV while on amiodarone but she did not convert.  Therefore, he stopped amiodarone.    Recent Echo 11/11/19 showed EF 20% with diffuse hypokinesis, mildly dilated RV with mildly decreased systolic function, PASP 49 mmHg, dilated IVC.   She presents to clinic today for f/u. Here with her son. At her last visit, we added spironolactone and referred her to paramedicine. Now followed by Thedore Mins. She reports that she has been doing fairly well since her last visit. She denies significant dyspnea. No CP. Compliant w/ medications. BP well controlled in the 790W systolic. Volume status has been stable. Compliant w/ daily wts. No weight gain at home. Device clinic did remote check last week on 5/3 which showed normal impedence and no AT/AF. Unfortunately, we were unable to get device check completed today due to office equipment error.  Her only complaint is lost of taste and poor appeitite, which both started around the time she started spironolactone. No known COVID infection or exposure and she denies any other related symptoms.     Labs (10/20): LDL 60 Labs (11/20): K 4.3, creatinine 1.97 Labs (2/21): K 4.5, creatinine 2.08 => 2.99, hgb 12.8 Labs (3/21): K 3.7, creatinine 2.24  Labs (4/21): K 3.7, creatine 2.59 Labs (4/21): K 4.2, creatinine 2.69    PMH: 1. CAD: s/p CABG in 2/15 with LIMA-LAD, SVG-OM2, SVG-PLV.  2. LBBB: Chronic.  3. Atrial fibrillation: Persistent since 2018.  - LA appendage clip with CBGdldkl 4. H/o aspiration PNA 5. Type 2 diabetes 6. COPD 7. CKD stage 3 8. Chronic systolic CHF: Ischemic cardiomyopathy.  She has a St Jude BiV ICD.  - Echo (2/15): EF 20-25%.  - Echo (5/15): EF 40-45% - Echo (10/20): EF <20%, mild LVH, normal RV size and systolic function.  - Echo (3/21): EF 20% with diffuse hypokinesis, mildly dilated RV with mildly decreased systolic function, PASP 49 mmHg, dilated IVC.  9. CKD: Stage 3.   Social History   Socioeconomic History  . Marital status: Widowed    Spouse name: Not on file  . Number of children: Not on file  . Years of education: Not on file  . Highest education level: Not on file  Occupational History  . Occupation: retired  Tobacco Use  . Smoking status: Former Smoker    Packs/day: 0.50    Years: 39.00    Pack years: 19.50    Types: Cigarettes    Quit date: 08/28/1993    Years since quitting: 26.3  . Smokeless tobacco: Never Used  Substance and Sexual Activity  . Alcohol  use: No    Comment: quit 95  . Drug use: No  . Sexual activity: Not on file  Other Topics Concern  . Not on file  Social History Narrative   Lives with husband and son in [place].  She is currently retired.   Former smoker quit in 1995.   Social Determinants of Health   Financial Resource Strain: Medium Risk  . Difficulty of Paying Living Expenses: Somewhat hard  Food Insecurity: No Food Insecurity  . Worried About Charity fundraiser in the Last Year: Never true  . Ran Out of Food in the Last Year: Never true  Transportation Needs: No Transportation Needs  .  Lack of Transportation (Medical): No  . Lack of Transportation (Non-Medical): No  Physical Activity:   . Days of Exercise per Week:   . Minutes of Exercise per Session:   Stress:   . Feeling of Stress :   Social Connections:   . Frequency of Communication with Friends and Family:   . Frequency of Social Gatherings with Friends and Family:   . Attends Religious Services:   . Active Member of Clubs or Organizations:   . Attends Archivist Meetings:   [name] Marital Status:   Intimate Partner Violence:   . Fear of Current or Ex-Partner:   . Emotionally Abused:   [name] Physically Abused:   . Sexually Abused:    Family History  Adopted: Yes  Problem Relation Age of Onset  . Other Mother   . Hypertension Mother   . Colon cancer Neg Hx   . Esophageal cancer Neg Hx   . Rectal cancer Neg Hx   . Stomach cancer Neg Hx    Current Outpatient Medications  Medication Sig Dispense Refill  . acetaminophen (TYLENOL) 650 MG CR tablet Take 1,300 mg by mouth 2 (two) times daily.    [name] albuterol (PROAIR HFA) 108 (90 Base) MCG/ACT inhaler Inhale 2 puffs into the lungs every 6 (six) hours as needed for wheezing or shortness of breath.    [name] apixaban (ELIQUIS) 5 MG TABS tablet Take 1 tablet (5 mg total) by mouth 2 (two) times daily. Resume Saturday evening. 60 tablet   . Ascorbic Acid (VITAMIN C) 1000 MG tablet Take 2,000 mg by mouth daily.    . AZO-CRANBERRY PO Take 1 tablet by mouth 2 (two) times daily.     . carboxymethylcellulose (REFRESH PLUS) 0.5 % SOLN Place 1 drop into the left eye daily.    . Coenzyme Q10 (COQ-10) 100 MG CAPS Take 100 mg by mouth daily.    . colchicine 0.6 MG tablet Take 0.6 mg by mouth daily.    . Dulaglutide (TRULICITY) 2.84 XL/2.4MW SOPN Inject 0.75 mg into the skin every Sunday at 6pm.     . ezetimibe (ZETIA) 10 MG tablet Take 10 mg by mouth daily.    . ferrous sulfate 325 (65 FE) MG tablet Take 325 mg by mouth every other day.    . fluticasone (CUTIVATE) 0.05 % cream  APPLY TO AFFECTED AREAS (FACE AND EARS) UP TO TWICE A DAY OR AS NEEDED    . ipratropium-albuterol (DUONEB) 0.5-2.5 (3) MG/3ML SOLN Take 3 mLs by nebulization every 4 (four) hours as needed (for shortness of breath).     . isosorbide-hydrALAZINE (BIDIL) 20-37.5 MG tablet Take 0.5 tablets by mouth 3 (three) times daily. 45 tablet 5  . levothyroxine (SYNTHROID) 112 MCG tablet Take 1 tablet (112 mcg total) by mouth daily before breakfast. 30  tablet 12  . metoprolol succinate (TOPROL-XL) 100 MG 24 hr tablet Take 1 tablet (100 mg total) by mouth daily. Take with or immediately following a meal. 30 tablet 2  . Misc Natural Products (JOINT HEALTH PO) Take 30 mLs by mouth daily.    . Multiple Vitamin (MULTIVITAMIN WITH MINERALS) TABS tablet Take 1 tablet by mouth daily.    . Multiple Vitamins-Minerals (PRESERVISION AREDS 2) CAPS Take 1 capsule by mouth 2 (two) times daily.    . prednisoLONE acetate (PRED FORTE) 1 % ophthalmic suspension Place 1 drop into the right eye daily.    . rosuvastatin (CRESTOR) 5 MG tablet Take 5 mg by mouth See admin instructions. Take one tablet (5 mg) by mouth at night on Sunday, Tuesday, Thursday, Saturday    . sodium chloride (MURO 128) 2 % ophthalmic solution Place 1 drop into the right eye daily.    [name] spironolactone (ALDACTONE) 25 MG tablet Take 0.5 tablets (12.5 mg total) by mouth daily. 45 tablet 3  . torsemide (DEMADEX) 20 MG tablet Take 3 tablets (60 mg total) by mouth 2 (two) times daily. 540 tablet 2  . triamcinolone cream (KENALOG) 0.1 % APPLY TO THE AFFECTED AREA UP TO TWICE DAILY OR AS NEEDED (NO TO FACE GROIN AXILLA)    . umeclidinium-vilanterol (ANORO ELLIPTA) 62.5-25 MCG/INH AEPB Inhale 1 puff into the lungs daily.     No current facility-administered medications for this encounter.   BP 128/86   Pulse 85   Wt 102.3 kg (225 lb 9.6 oz)   SpO2 99%   BMI 41.26 kg/m   PHYSICAL EXAM: General:  Well appearing, elderly AAM in wheel chair . No respiratory  difficulty HEENT: normal Neck: supple. no JVD. Carotids 2+ bilat; no bruits. No lymphadenopathy or thyromegaly appreciated. Cor: PMI nondisplaced. Regular rate & rhythm. No rubs, gallops or murmurs. Lungs: clear Abdomen: soft, nontender, nondistended. No hepatosplenomegaly. No bruits or masses. Good bowel sounds. Extremities: no cyanosis, clubbing, rash, edema Neuro: alert & oriented x 3, cranial nerves grossly intact. moves all 4 extremities w/o difficulty. Affect pleasant.  Assessment/Plan: 1. Chronic systolic CHF: Ischemic cardiomyopathy.  St Jude CRT-D device.  Echo 3/21 showed EF 20% with diffuse hypokinesis, mildly dilated RV with mildly decreased systolic function, PASP 49 mmHg, dilated IVC.   - Volume status stable. Appears euvolemic on exam. Euvolemic by recent [employer] Analysis 5/3 (unable to generate report today) - NYHA class II-III symptoms, stable  - Continue Torsemide 60 mg bid.   - Continue Bidil 1/2 tab tid.  - Continue Toprol XL 100 mg daily.  - We will d/c spironolactone given lost of taste and reduced appetite since starting medication (she thinks side effect of drug)  - Check CMP today - discussed continuation of daily wts and low sodium diet - continue w/ paramedicine. Appreciate their assistance  2. CAD: S/p CABG in 2015.   - denies anginal symptoms  - no longer on asa due to chronic Eliquis   - Continue Crestor 5 daily.  3. Atrial fibrillation: Chronic now, looks like it has gone on since 2018.  Dr. Aundra Dubin was unable to cardiovert her on amiodarone so it was stopped.  Do not think that we will be able to get her out of atrial fibrillation.  - Continue apixaban. Denies abnormal bleeding  - Check CBC today  4. CKD: Stage 3.   - Check CMP today  - followed by Dr. Hollie Salk  5. HTN - controlled on current regimen but will  stop low dose spironolactone due to side effects - she has upcomming f/u w/ Dr. Ellyn Hack in 2 weeks, if BP elevated, may need further increase of Bidil  dose   Continue w/ paramedicine. F/u w/ Dr. Aundra Dubin in 3-4 months   Lyda Jester, PA-C  01/06/2020

## 2020-01-13 ENCOUNTER — Encounter (HOSPITAL_COMMUNITY): Payer: Medicare Other | Admitting: Internal Medicine

## 2020-01-16 ENCOUNTER — Other Ambulatory Visit (HOSPITAL_COMMUNITY): Payer: Self-pay

## 2020-01-16 ENCOUNTER — Telehealth: Payer: Self-pay | Admitting: Cardiology

## 2020-01-16 NOTE — Telephone Encounter (Signed)
   Pt would like to request to bring her oldest son to her appt on 01/20/20 she is on a wheelchair and he will be assisting her

## 2020-01-16 NOTE — Telephone Encounter (Signed)
Pt advised that her son can assist her to her 01/20/20 appt.

## 2020-01-16 NOTE — Progress Notes (Signed)
Paramedicine Encounter    Patient ID: Elaine [name], female    DOB: December 09, 1940, 79 y.o.   MRN: [id number]   Patient Care Team: Marton Redwood, MD as PCP - General (Internal Medicine) Leonie Man, MD as PCP - Cardiology (Cardiology) Constance Haw, MD as PCP - Electrophysiology (Cardiology) Madelon Lips, MD as Consulting Physician (Nephrology) Jorge Ny, LCSW as Social Worker (Licensed Clinical Social Worker)  Patient Active Problem List   Diagnosis Date Noted  . Chronic systolic (congestive) heart failure ([place]) 07/29/2019  . Hypothyroidism 06/16/2019  . Acute on chronic combined systolic and diastolic CHF (congestive heart failure) ([place]) 06/11/2019  . Bilateral leg pain 10/22/2017  . Chronic combined systolic and diastolic CHF, NYHA class 2 ([place]) 10/22/2017  . Edema of both legs 05/10/2015  . Mass of right breast on mammogram 10/06/2014  . DOE (dyspnea on exertion) 08/19/2014  . Severe obesity (BMI >= 40) ([place]) 04/19/2014  . CAD status post CABG 04/19/2014  . Metabolic acidosis 76/19/5093    Class: Acute  . Acute on chronic renal insufficiency 02/02/2014  . Near syncope 02/02/2014  . Hypotension 02/02/2014  . Rash 02/02/2014  . Acute kidney failure ([place]) 02/02/2014    Class: Acute  . Pruritus 02/02/2014    Class: Chronic  . SOB (shortness of breath) 01/07/2014  . Coumadin Rx post CABG 11/17/2013  . Physical deconditioning 11/05/2013  . S/P CABG x 3- 10/23/13 10/23/2013  . Permanent atrial fibrillation (HCC) - CHA2DS2-VASc Score 6, on Eliquis 10/20/2013  . Left bundle branch block (LBBB) on electrocardiogram 10/20/2013  . Left main coronary artery disease 10/20/2013    Class: Hospitalized for  . History of acute anterior wall myocardial infarction 10/20/2013  . Ischemic cardiomyopathy 10/20/2013  . Morbid obesity- BMI 48   . Diabetic neuropathy ([place])   . Acute kidney injury superimposed on chronic kidney disease ([place])   . Obesity 12/20/2006  . Type II  diabetes mellitus with complication ([place]) 26/71/2458  . Hyperlipidemia with target LDL less than 70 08/06/2006  . Essential hypertension 08/06/2006  . OSTEOARTHRITIS 08/06/2006    Current Outpatient Medications:  .  acetaminophen (TYLENOL) 650 MG CR tablet, Take 1,300 mg by mouth 2 (two) times daily., Disp: , Rfl:  .  albuterol (PROAIR HFA) 108 (90 Base) MCG/ACT inhaler, Inhale 2 puffs into the lungs every 6 (six) hours as needed for wheezing or shortness of breath., Disp: , Rfl:  .  apixaban (ELIQUIS) 5 MG TABS tablet, Take 1 tablet (5 mg total) by mouth 2 (two) times daily. Resume Saturday evening., Disp: 60 tablet, Rfl:  .  Ascorbic Acid (VITAMIN C) 1000 MG tablet, Take 2,000 mg by mouth daily., Disp: , Rfl:  .  AZO-CRANBERRY PO, Take 1 tablet by mouth 2 (two) times daily. , Disp: , Rfl:  .  carboxymethylcellulose (REFRESH PLUS) 0.5 % SOLN, Place 1 drop into the left eye daily., Disp: , Rfl:  .  Coenzyme Q10 (COQ-10) 100 MG CAPS, Take 100 mg by mouth daily., Disp: , Rfl:  .  colchicine 0.6 MG tablet, Take 0.6 mg by mouth daily., Disp: , Rfl:  .  Dulaglutide (TRULICITY) 0.99 IP/3.8SN SOPN, Inject 0.75 mg into the skin every Sunday at 6pm. , Disp: , Rfl:  .  ezetimibe (ZETIA) 10 MG tablet, Take 10 mg by mouth daily., Disp: , Rfl:  .  ferrous sulfate 325 (65 FE) MG tablet, Take 325 mg by mouth every other day., Disp: , Rfl:  .  fluticasone (  CUTIVATE) 0.05 % cream, APPLY TO AFFECTED AREAS (FACE AND EARS) UP TO TWICE A DAY OR AS NEEDED, Disp: , Rfl:  .  ipratropium-albuterol (DUONEB) 0.5-2.5 (3) MG/3ML SOLN, Take 3 mLs by nebulization every 4 (four) hours as needed (for shortness of breath). , Disp: , Rfl:  .  isosorbide-hydrALAZINE (BIDIL) 20-37.5 MG tablet, Take 0.5 tablets by mouth 3 (three) times daily., Disp: 45 tablet, Rfl: 5 .  levothyroxine (SYNTHROID) 112 MCG tablet, Take 1 tablet (112 mcg total) by mouth daily before breakfast., Disp: 30 tablet, Rfl: 12 .  metoprolol succinate  (TOPROL-XL) 100 MG 24 hr tablet, Take 1 tablet (100 mg total) by mouth daily. Take with or immediately following a meal., Disp: 30 tablet, Rfl: 2 .  Misc Natural Products (JOINT HEALTH PO), Take 30 mLs by mouth daily., Disp: , Rfl:  .  Multiple Vitamin (MULTIVITAMIN WITH MINERALS) TABS tablet, Take 1 tablet by mouth daily., Disp: , Rfl:  .  Multiple Vitamins-Minerals (PRESERVISION AREDS 2) CAPS, Take 1 capsule by mouth 2 (two) times daily., Disp: , Rfl:  .  prednisoLONE acetate (PRED FORTE) 1 % ophthalmic suspension, Place 1 drop into the right eye daily., Disp: , Rfl:  .  rosuvastatin (CRESTOR) 5 MG tablet, Take 5 mg by mouth See admin instructions. Take one tablet (5 mg) by mouth at night on Sunday, Tuesday, Thursday, Saturday, Disp: , Rfl:  .  sodium chloride (MURO 128) 2 % ophthalmic solution, Place 1 drop into the right eye daily., Disp: , Rfl:  .  torsemide (DEMADEX) 20 MG tablet, Take 3 tablets (60 mg total) by mouth 2 (two) times daily., Disp: 540 tablet, Rfl: 2 .  triamcinolone cream (KENALOG) 0.1 %, APPLY TO THE AFFECTED AREA UP TO TWICE DAILY OR AS NEEDED (NO TO FACE GROIN AXILLA), Disp: , Rfl:  .  umeclidinium-vilanterol (ANORO ELLIPTA) 62.5-25 MCG/INH AEPB, Inhale 1 puff into the lungs daily., Disp: , Rfl:  Allergies  Allergen Reactions  . Crestor [Rosuvastatin Calcium] Other (See Comments)    Cramps- still taking   . Allopurinol Hives  . Tape Rash    Adhesive tape      Social History   Socioeconomic History  . Marital status: Widowed    Spouse name: Not on file  . Number of children: Not on file  . Years of education: Not on file  . Highest education level: Not on file  Occupational History  . Occupation: retired  Tobacco Use  . Smoking status: Former Smoker    Packs/day: 0.50    Years: 39.00    Pack years: 19.50    Types: Cigarettes    Quit date: 08/28/1993    Years since quitting: 26.4  . Smokeless tobacco: Never Used  Substance and Sexual Activity  . Alcohol  use: No    Comment: quit 95  . Drug use: No  . Sexual activity: Not on file  Other Topics Concern  . Not on file  Social History Narrative   Lives with husband and son in [place].  She is currently retired.   Former smoker quit in 1995.   Social Determinants of Health   Financial Resource Strain: Medium Risk  . Difficulty of Paying Living Expenses: Somewhat hard  Food Insecurity: No Food Insecurity  . Worried About Charity fundraiser in the Last Year: Never true  . Ran Out of Food in the Last Year: Never true  Transportation Needs: No Transportation Needs  . Lack of Transportation (Medical): No  .  Lack of Transportation (Non-Medical): No  Physical Activity:   . Days of Exercise per Week:   . Minutes of Exercise per Session:   Stress:   . Feeling of Stress :   Social Connections:   . Frequency of Communication with Friends and Family:   . Frequency of Social Gatherings with Friends and Family:   . Attends Religious Services:   . Active Member of Clubs or Organizations:   . Attends Archivist Meetings:   [name] Marital Status:   Intimate Partner Violence:   . Fear of Current or Ex-Partner:   . Emotionally Abused:   [name] Physically Abused:   . Sexually Abused:     Physical Exam Cardiovascular:     Rate and Rhythm: Normal rate and regular rhythm.     Pulses: Normal pulses.  Pulmonary:     Effort: Pulmonary effort is normal.     Breath sounds: Normal breath sounds.  Musculoskeletal:        General: Normal range of motion.     Right lower leg: No edema.     Left lower leg: No edema.  Skin:    General: Skin is warm and dry.     Capillary Refill: Capillary refill takes less than 2 seconds.  Neurological:     Mental Status: She is alert and oriented to person, place, and time.  Psychiatric:        Mood and Affect: Mood normal.         Future Appointments  Date Time Provider [place]  01/20/2020  9:00 AM Leonie Man, MD [facility]   01/27/2020  7:00 AM [facility] DEVICE REMOTES [facility]  02/02/2020 10:50 AM [facility] DEVICE REMOTES [facility]  04/22/2020 10:00 AM Larey Dresser, MD [facility] None  04/27/2020  7:00 AM [facility] DEVICE REMOTES [facility]    BP 107/60 (BP Location: Left Arm, Patient Position: Sitting, Cuff Size: Normal)   Pulse 80   Resp 16   Wt 231 lb (104.8 kg)   SpO2 97%   BMI 42.25 kg/m   Weight yesterday- 228 lb Last visit weight- 225.6 lb  Ms Goslin was seen at home today and reported feeling well. She denied chest pain, SOB, headache, dizziness, orthopnea, fever or cough since our last visit. She stated she has been compliant with her medications over the past week but her weight increased 6 lb. When asked about her weight gain she stated she was running around with her son yesterday and due to the increased heat and mobility she had increased thirst and did not follow her fluid restrictions. She was understanding of how this added to her weight and stated she knew if would decrease over the weekend if she follows her fluid restriction. She continues to show clear understanding for her medications and stated if she has concerns she would contact the HF clinic. We discussed discharge from paramedicine and she stated she believes she is capable of managing without assistance. I will contact the HF clinic and make them aware of her discharge.   Jacquiline Doe, EMT 01/16/20  ACTION: Home visit completed

## 2020-01-20 ENCOUNTER — Other Ambulatory Visit: Payer: Self-pay

## 2020-01-20 ENCOUNTER — Ambulatory Visit (INDEPENDENT_AMBULATORY_CARE_PROVIDER_SITE_OTHER): Payer: Medicare Other | Admitting: Cardiology

## 2020-01-20 VITALS — BP 100/50 | HR 84 | Temp 96.1°F | Ht 62.0 in | Wt 230.0 lb

## 2020-01-20 DIAGNOSIS — I5042 Chronic combined systolic (congestive) and diastolic (congestive) heart failure: Secondary | ICD-10-CM

## 2020-01-20 DIAGNOSIS — E785 Hyperlipidemia, unspecified: Secondary | ICD-10-CM

## 2020-01-20 DIAGNOSIS — I4821 Permanent atrial fibrillation: Secondary | ICD-10-CM | POA: Diagnosis not present

## 2020-01-20 DIAGNOSIS — I10 Essential (primary) hypertension: Secondary | ICD-10-CM

## 2020-01-20 DIAGNOSIS — E118 Type 2 diabetes mellitus with unspecified complications: Secondary | ICD-10-CM

## 2020-01-20 DIAGNOSIS — I2510 Atherosclerotic heart disease of native coronary artery without angina pectoris: Secondary | ICD-10-CM

## 2020-01-20 DIAGNOSIS — I255 Ischemic cardiomyopathy: Secondary | ICD-10-CM

## 2020-01-20 DIAGNOSIS — R5381 Other malaise: Secondary | ICD-10-CM

## 2020-01-20 DIAGNOSIS — R55 Syncope and collapse: Secondary | ICD-10-CM

## 2020-01-20 NOTE — Progress Notes (Signed)
Primary Care Provider: Marton Redwood, MD Cardiologist: Glenetta Hew, MD  Advanced Heart Failure Cardiologist: Loralie Champagne, MD Electrophysiologist: Will Meredith Leeds, MD  Nephrologist: Dr. Hollie Salk  Clinic Note: Acute Chief Complaint  Patient presents with  . Follow-up    4 months.  . Cardiomyopathy    At least some component related to ischemic cardiomyopathy.  Chronic mild systolic and diastolic heart failure  . Atrial Fibrillation    PAF.  Only notes when regularly.    HPI:    Elaine [name] is a 79 y.o. female with a complicated medical history of CAD-CABG with LAA clipping and ischemic cardiomyopathy ICD having improved from 20-25% up to 45% and then dropping again to roughly 20%, along with paroxysmal A. fib RVR with LBBB who presents today for routine follow-up  CHF Study History:-Chronic combined systolic and diastolic CHF with ischemic cardiomyopathy   and has class III CKD.  - Echo (2/15): EF 20-25%.   - Echo (5/15): EF 40-45%  St Jude BiV ICD.   - Echo (10/20): EF <20%, mild LVH, normal RV size and systolic function.   - Echo (3/21): EF 20% with diffuse hypokinesis, mildly dilated RV with mildly decreased systolic function, PASP 49 mmHg, dilated IVC.   I last saw Ms. Elaine [name] on September 27, 2019.  We reviewed the results of her echocardiogram showing reduced EF down to less than 20% this was 2 months after BiV ICD.  Dose stable weights.  Otherwise doing fairly well..-She indicated that she felt 90% better after her CRT-D placement. --> Discussed sliding scale Lasix.  Also ensure that she has follow-up with the heart failure clinic.  [name] was just seen on May 11 by Ellen Henri, PA, reviewed with Dr. Dorian Furnace this time she noted that they were unsuccessful with attempted amiodarone supported DCCV from Persistent A. fib.  They were unable to restore sinus rhythm.--Noted that they had started on spironolactone and further repair medicine follow-up in the  outpatient setting.  Noted blood pressures well controlled.  Was noted to have elevated potassium levels and creatinine ranging from 1.97-2.69.  Noted NYHA class II-III CHF symptoms --> Continued on torsemide 60 mg twice daily, one half BiDil tablet twice 3 times daily, Toprol XL 100 mg daily.  Spironolactone DC'd because of reduced appetite.  Continue on Eliquis for anticoagulation and amiodarone discontinued.  Recent Hospitalizations: Only for attempted cardioversion on October 22, 2019, unsuccessful  Reviewed  CV studies:    The following studies were reviewed today: (if available, images/films reviewed: From Epic Chart or Care Everywhere) . Echocardiogram from 11/11/2019 reviewed above.:   Interval History:   Elaine [name] is here today for follow-up as usual in great spirits.  She is always smiling and happy.  She is here with one of her sons who is asking questions about fluid restrictions and what she should or should not drink.  He is meaning well, but I do not think has full understanding of the severity of her condition. She tells me that she has no sensation whatsoever irregular heartbeats but will never now and then feel them in her temple areas.  She is really limited as far as mobility and is mostly in wheelchair.  She has significant peripheral neuropathy from her diabetes and has very poor balance.  She walks around the house a little bit but not for any distance.  She is a hold on holding of her weight for long time.  As such, she is not  very active and therefore would never know any symptoms of claudication. Her son says that with the use of a walker she can walk about 30 to 50 feet without getting short of breath.  She indicates that a lot of his dyspnea is related to hip and knee/back discomfort as much shortness of breath.  It is a lot of effort for her to do much activity.  She likes to sleep in recliner mostly because she has a hard to getting out of the bed.  She really  denies true orthopnea symptoms and no real PND.  Swelling is pretty well controlled on current dose of torsemide.  She says that a lot of the shortness of breath that she feels may very well be related to her "COPD as well ".  She likes to have soda such as Sunkist and Cheerwine at the house, ostensibly for gas.  But she also likes to drink the Sunkist.  She likes to enjoy it every now and then and understands that she probably should not but is okay with it.  CV Review of Symptoms (Summary) Cardiovascular ROS: positive for - dyspnea on exertion, edema, irregular heartbeat and Exercise intolerance/fatigue negative for - chest pain, paroxysmal nocturnal dyspnea, rapid heart rate or Syncope/near syncope, TIA/amaurosis fugax, claudication.  The patient does not have symptoms concerning for COVID-19 infection (fever, chills, cough, or new shortness of breath).  The patient is practicing social distancing & Masking.    REVIEWED OF SYSTEMS   Review of Systems  Constitutional: Positive for malaise/fatigue (Does not have a lot of energy, but seems to be okay with it.  Somewhat limited by arthritis pains.). Negative for weight loss.  HENT: Negative for nosebleeds.   Respiratory: Positive for cough and shortness of breath. Negative for hemoptysis and sputum production.   Cardiovascular: Positive for leg swelling (Stable).  Gastrointestinal: Negative for abdominal pain, blood in stool, diarrhea and melena.  Genitourinary: Negative for hematuria.  Musculoskeletal: Positive for back pain and joint pain. Negative for falls.  Neurological: Positive for dizziness (Sometimes if she stands up too quick), tingling (Peripheral neuropathy) and weakness (Generalized leg and hip weakness.). Negative for headaches.  Psychiatric/Behavioral: Positive for memory loss. Negative for depression. The patient is not nervous/anxious and does not have insomnia.    I have reviewed and (if needed) personally updated the  patient's problem list, medications, allergies, past medical and surgical history, social and family history.   PAST MEDICAL HISTORY   Past Medical History:  Diagnosis Date  . Acute myocardial infarction of anterior wall (HCC) 10/20/2013   Afib, LBBB  . Acute pulmonary edema with congestive heart failure ([place]) 10/20/2013   Resolved  . Arthritis   . Atrial fibrillation ([place]) 10/20/2013   On Warfarin  . CAD, multiple vessel 10/20/2013   LM, LAD & RCA --> CABG; MYOVIEW 12/'15: Intermediate Risk would large anterior, anteroapical segment the apex possible infarct and peri-infarct ischemia  . CHF (congestive heart failure) (HCC)    EF 40-45%.  . Chronic kidney disease   . COPD (chronic obstructive pulmonary disease) ([place])   . Diabetic neuropathy ([place])   . Heart murmur    Likely related to aortic sclerosis  . Hyperlipidemia   . Hypertension   . Hypothyroidism   . Ischemic cardiomyopathy - Notable Improvement in EF post CABG 10/20/2013   Echo 2/23: EF 20-25%; mild LVH. anteroseptal Akinesis; mid-apical anterior, inferior & inferoseptal + apical-lateral akinesis; G 1 DDysfxn.; Mild-Mod LA dil; mild MR; Mod PHTN;; F/u  Echo 12/2013: EF 40-45%, septal & apical HK, mild LVH; Mod LA dilation  . Left bundle branch block (LBBB) on electrocardiogram   . Mild mitral regurgitation by prior echocardiogram    Mild-Mod MR on Echo  . Morbid obesity ([place])   . OSTEOARTHRITIS 08/06/2006  . S/P CABG x 3 10/23/2013   LIMA to LAD, SVG to OM2, SVG to RPLB, EVH via right thigh  . Type II diabetes mellitus with complication ([place]) 98/33/8250   off rx since heart attack 12/15-dr told could stay off if keep cbg under 150  . UTI (lower urinary tract infection) 09/29/14   ongoing now. started rx 09/28/14    PAST SURGICAL HISTORY   Past Surgical History:  Procedure Laterality Date  . ABDOMINAL HYSTERECTOMY    . BIV ICD INSERTION CRT-D N/A 07/28/2019   Procedure: BIV ICD INSERTION CRT-D;  Surgeon: Constance Haw,  MD;  Location: [facility];  Service: Cardiovascular;  Laterality: N/A;  . BREAST LUMPECTOMY W/ NEEDLE LOCALIZATION Right 10/06/2014   Dr Georgette Dover  . BREAST LUMPECTOMY WITH NEEDLE LOCALIZATION Right 10/06/2014   Procedure: RIGHT BREAST LUMPECTOMY WITH NEEDLE LOCALIZATION;  Surgeon: Donnie Mesa, MD;  Location: [facility];  Service: General;  Laterality: Right;  . CARDIOVERSION N/A 10/22/2019   Procedure: CARDIOVERSION;  Surgeon: Larey Dresser, MD;  Location[facility] ENDOSCOPY;  Service: Cardiovascular;  Laterality: N/A;  . CLIPPING OF ATRIAL APPENDAGE N/A 10/23/2013   Procedure: CLIPPING OF ATRIAL APPENDAGE;  Surgeon: Rexene Alberts, MD;  Location: [facility];  Service: Open Heart Surgery;  Laterality: N/A;  . 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  . CORONARY ARTERY BYPASS GRAFT N/A 10/23/2013   Procedure: CORONARY ARTERY BYPASS GRAFTING (CABG);  Surgeon: Rexene Alberts, MD;  Location: [facility];  Service: Open Heart Surgery: LIMA-LAD, SVG-OM2, SVG-RPL  . INTRAOPERATIVE TRANSESOPHAGEAL ECHOCARDIOGRAM N/A 10/23/2013   Procedure: INTRAOPERATIVE TRANSESOPHAGEAL ECHOCARDIOGRAM;  Surgeon: Rexene Alberts, MD;  Location: [facility];  Service: Open Heart Surgery;  Laterality: N/A;  . LEFT HEART CATHETERIZATION WITH CORONARY ANGIOGRAM N/A 10/20/2013   Procedure: LEFT HEART CATHETERIZATION WITH CORONARY ANGIOGRAM;  Surgeon: Leonie Man, MD;  Location: [facility] CATH LAB;  Service: Cardiovascular: Distal LM ~70%, LAD - ostial 70%, prox 80, 95 & 95%; RCA prox 70%, mid 80%; minimal Cx disease  . LEFT HEART CATHETERIZATION WITH CORONARY/GRAFT ANGIOGRAM N/A 09/08/2014   Procedure: LEFT HEART CATHETERIZATION WITH Beatrix Fetters;  Surgeon: Leonie Man, MD;  Location: [facility] CATH LAB: Indication: Abnormal Myoview. Occluded LAD after 70 and 80% left main. Diffuse RCA disease. Widely patent grafts to moderately diseased artery vessels. Mild-moderate LV dysfunction and chronic A. fib.  [name] NM MYOVIEW LTD  08/14/2014   [place] scan:  INTERMEDIATE RISK - large, severe intensity perfusion defect in anterior wall, apex and inferior apex that is partly reversible. This suggests either scar or possible hibernating myocardium. Nondeviated.-> Likely scar. No change in coronary anatomy with patent grafts.  [name] TOTAL HIP ARTHROPLASTY  2010, 2011   right 2010, left 2011  . TRANSTHORACIC ECHOCARDIOGRAM  12/2013   Mildly dilated left ventricle with mild to moderately reduced function. EF 40-45% (notable improvement from February 2015). Septal and apical hypokinesis. Mild LA dilation.    MEDICATIONS/ALLERGIES   Current Meds  Medication Sig  . acetaminophen (TYLENOL) 650 MG CR tablet Take 1,300 mg by mouth 2 (two) times daily.  [name] albuterol (PROAIR HFA) 108 (90 Base) MCG/ACT inhaler Inhale 2 puffs into the lungs every 6 (  six) hours as needed for wheezing or shortness of breath.  [name] apixaban (ELIQUIS) 5 MG TABS tablet Take 1 tablet (5 mg total) by mouth 2 (two) times daily. Resume Saturday evening.  . Ascorbic Acid (VITAMIN C) 1000 MG tablet Take 2,000 mg by mouth daily.  . AZO-CRANBERRY PO Take 1 tablet by mouth 2 (two) times daily.   . carboxymethylcellulose (REFRESH PLUS) 0.5 % SOLN Place 1 drop into the left eye daily.  . Coenzyme Q10 (COQ-10) 100 MG CAPS Take 100 mg by mouth daily.  . colchicine 0.6 MG tablet Take 0.6 mg by mouth daily.  . Dulaglutide (TRULICITY) 7.02 OV/7.8HY SOPN Inject 0.75 mg into the skin every Sunday at 6pm.   . ezetimibe (ZETIA) 10 MG tablet Take 10 mg by mouth daily.  . ferrous sulfate 325 (65 FE) MG tablet Take 325 mg by mouth every other day.  . fluticasone (CUTIVATE) 0.05 % cream APPLY TO AFFECTED AREAS (FACE AND EARS) UP TO TWICE A DAY OR AS NEEDED  . isosorbide-hydrALAZINE (BIDIL) 20-37.5 MG tablet Take 0.5 tablets by mouth 3 (three) times daily.  [name] levothyroxine (SYNTHROID) 112 MCG tablet Take 1 tablet (112 mcg total) by mouth daily before breakfast.  . metoprolol succinate (TOPROL-XL) 100 MG 24 hr  tablet Take 1 tablet (100 mg total) by mouth daily. Take with or immediately following a meal.  . Misc Natural Products (JOINT HEALTH PO) Take 30 mLs by mouth daily.  . Multiple Vitamin (MULTIVITAMIN WITH MINERALS) TABS tablet Take 1 tablet by mouth daily.  . Multiple Vitamins-Minerals (PRESERVISION AREDS 2) CAPS Take 1 capsule by mouth 2 (two) times daily.  . prednisoLONE acetate (PRED FORTE) 1 % ophthalmic suspension Place 1 drop into the right eye daily.  . rosuvastatin (CRESTOR) 5 MG tablet Take 5 mg by mouth See admin instructions. Take one tablet (5 mg) by mouth at night on Sunday, Tuesday, Thursday, Saturday  . sodium chloride (MURO 128) 2 % ophthalmic solution Place 1 drop into the right eye daily.  [name] torsemide (DEMADEX) 20 MG tablet Take 3 tablets (60 mg total) by mouth 2 (two) times daily.  [name] triamcinolone cream (KENALOG) 0.1 % APPLY TO THE AFFECTED AREA UP TO TWICE DAILY OR AS NEEDED (NO TO FACE GROIN AXILLA)  . umeclidinium-vilanterol (ANORO ELLIPTA) 62.5-25 MCG/INH AEPB Inhale 1 puff into the lungs daily.    Allergies  Allergen Reactions  . Crestor [Rosuvastatin Calcium] Other (See Comments)    Cramps- still taking   . Allopurinol Hives  . Tape Rash    Adhesive tape    SOCIAL HISTORY/FAMILY HISTORY   Reviewed in Epic:  Pertinent findings: n/a -> she has 2 sons that are usually around her as well as a daughter or niece that is also very much involved.  OBJCTIVE -PE, EKG, labs   Wt Readings from Last 3 Encounters:  01/20/20 230 lb (104.3 kg)  01/16/20 231 lb (104.8 kg)  01/06/20 225 lb 9.6 oz (102.3 kg)    Physical Exam: BP (!) 100/50   Pulse 84   Temp (!) 96.1 F (35.6 C)   Ht 5\' 2"  (1.575 m)   Wt 230 lb (104.3 kg)   BMI 42.07 kg/m  Physical Exam  Constitutional: She is oriented to person, place, and time.  Well-groomed.  Relatively comfortable appearing.  No obvious distress.  She is in a wheelchair.  Morbidly obese.  Chronically ill, but nontoxic.  HENT:    Head: Normocephalic and atraumatic.  Eyes:  Right I  frosted over from prior corneal transplant essentially between 1   Neck: No JVD present.  Cardiovascular: Normal rate, S1 normal and S2 normal. An irregularly irregular rhythm present.  No extrasystoles are present. PMI is displaced (Mildly lateral and sustained). Exam reveals distant heart sounds and decreased pulses (Pedal pulses are difficult to palpate because of body habitus). Exam reveals no gallop.  Murmur (Cannot exclude soft systolic 1/6 SEM at RUSB) heard. Pulmonary/Chest: Effort normal. No respiratory distress. She has wheezes. She has no rales. She exhibits no tenderness.  Diffuse interstitial sounds with crackles but no rales or rhonchi.  Abdominal: Soft. Bowel sounds are normal. She exhibits no distension. There is no abdominal tenderness.  Musculoskeletal:        General: Edema (She has chronic leg edema.  Roughly 2+ at the knees.) present.     Cervical back: Normal range of motion and neck supple.     Comments: Decreased range of motion, hips knees ankles albeit weak.  Neurological: She is alert and oriented to person, place, and time. No cranial nerve deficit.  Skin: Skin is warm and dry.  Psychiatric: She has a normal mood and affect. Her behavior is normal. Thought content normal.  Somewhat jovial at baseline.  Tends to crack jokes around things.  But also has a pretty good insight on her perception of symptoms.  Overall happy with how she feels    Adult ECG Report Not checked  Recent Labs:   Lab Results  Component Value Date   CREATININE 2.46 (H) 01/06/2020   CREATININE 2.69 (H) 12/17/2019   CREATININE 2.59 (H) 12/05/2019   Lab Results  Component Value Date   CHOL 131 06/13/2019   HDL 57 06/13/2019   LDLCALC 60 06/13/2019   TRIG 71 06/13/2019   CHOLHDL 2.3 06/13/2019   Lab Results  Component Value Date   CREATININE 2.46 (H) 01/06/2020   BUN 60 (H) 01/06/2020   NA 136 01/06/2020   K 3.6 01/06/2020   CL  96 (L) 01/06/2020   CO2 24 01/06/2020   Lab Results  Component Value Date   TSH 3.708 09/03/2014    ASSESSMENT/PLAN    Problem List Items Addressed This Visit    Permanent atrial fibrillation (HCC) - CHA2DS2-VASc Score 6, on Eliquis (Chronic)    Unfortunately, I think this is definitely playing a role with her IVCD on baseline EKG.  It makes the use of CRT-D less effective.  Unable to cardiovert with amiodarone assistance.  Therefore no longer on amiodarone.  Remains on rate control Toprol and anticoagulated with Eliquis.        Left main coronary artery disease (Chronic)    Referred for CABG in 2015.  Has not really noticed any anginal symptoms despite being in A. fib.   Plan: Continue with apixaban and no aspirin Plavix.  She is on Zetia in addition to low-dose Crestor taking intermittently.  Most tolerated. She is on Toprol plus BiDil for blood pressure/CHF/afterload reduction.  In the absence of active anginal symptoms, would probably hold off on ischemic evaluation..      Ischemic cardiomyopathy (Chronic)    Unfortunately, after initially getting some benefit from her CABG she drifted back to her reduced EF.  I suspect a lot of this has to do with A. fib, bundle branch block etc.  Unfortunately it does limit the benefit of BiV ICD.  It could be that very reason that we have seen a backslide.  In the absence of active anginal symptoms  would preclude the need for ischemic evaluation at this point based on her baseline creatinine levels.  We will try to avoid any contrast unless symptoms warrant.      Chronic combined systolic and diastolic CHF, NYHA class 2 (HCC) - Primary (Chronic)    Chronic, stable symptoms.  Edema seems to be at his baseline. We talked about sliding scale. She has BiV ICD in place.  Unfortunately has A. fib 700 restore sinus rhythm.  Plan: Continue current dose of Toprol-switch from carvedilol for more heart rate responsiveness then blood pressure  control.  Was also converted to BiDil in lieu of ACE inhibitor/ARB.  We talked about sliding scale torsemide based on weights.  Discussed salt restriction and generalized fluid restriction.  Also limiting what she actually drinks.  Unfortunately, this is difficult because she enjoys to cook.  She can stand up for an hour or so without getting short of breath.  She will continue to follow-up with Dr. Aundra Dubin      Hyperlipidemia with target LDL less than 70 (Chronic)    Remains on very low-dose rosuvastatin. On last check, LDL was 60 which is very beneficial.       Essential hypertension (Chronic)    Plan her blood pressure is actually borderline low today on Toprol and BiDil.  No ability to titrate further.  I am not sure she will be tolerating the spironolactone.      Severe obesity (BMI >= 40) (HCC) (Chronic)    Hard to lose weight because of deconditioning and lack of exercise.  Hard to do exercise because of significant arthritis pains.      Type II diabetes mellitus with complication (HCC) (Chronic)    Remains on Trulicity.  Being followed by PCP.  With current creatinine level, would defer to Dr. Hollie Salk, we should probably go to consider Farxiga or Jardiance for HFrEF therapy.  This will hopefully will help [place] reduce her diuretic dosing.      Physical deconditioning    I wonder how much she would potentially benefit from home health physical therapy.  Would defer to the heart care clinic and PCP to see if this can be arranged, but I think it would be beneficial.      Near syncope       COVID-19 Education: The signs and symptoms of COVID-19 were discussed with the patient and how to seek care for testing (follow up with PCP or arrange E-visit).   The importance of social distancing and COVID-19 vaccination was discussed today.  I spent a total of 53minutes with the patient. >  50% of the time was spent in direct patient consultation.  --> We spent quite a bit time  answering questions about dietary adjustment and drinks, fluid restriction etc.  We talked about salty 6 briefly, we talked about appropriate beverages to drink and what to avoid.  Son had several questions about symptoms to look out for.  We discussed these.  We discussed her general conception and how well she is doing.  I did not yet broach the topic of CODE STATUS although it is on the we should get into. Additional time spent with chart review  / charting (studies, outside notes, etc): 12 Total Time: 42 min   Current medicines are reviewed at length with the patient today.  (+/- concerns) none  Notice: This dictation was prepared with Dragon dictation along with smaller phrase technology. Any transcriptional errors that result from this process are unintentional and may  not be corrected upon review.  Patient Instructions / Medication Changes & Studies & Tests Ordered   Patient Instructions  Medication Instructions:   Try taking your iron tablet at least 2 to 3 times a day  No other changes  *If you need a refill on your cardiac medications before your next appointment, please call your pharmacy*   Lab Work: Not needed    Testing/Procedures: Not needed   Follow-Up: At [facility], you and your health needs are our priority.  As part of our continuing mission to provide you with exceptional heart care, we have created designated Provider Care Teams.  These Care Teams include your primary Cardiologist (physician) and Advanced Practice Providers (APPs -  Physician Assistants and Nurse Practitioners) who all work together to provide you with the care you need, when you need it.     Your next appointment:   6 month(s)  The format for your next appointment:   In Person  Provider:   Glenetta Hew, MD      Studies Ordered:   No orders of the defined types were placed in this encounter.    Glenetta Hew, M.D., M.S. Interventional Cardiologist   Pager #  [phone] Phone # [phone] [address] [place], [place] [address]   Thank you for choosing Heartcare at [facility]!!

## 2020-01-20 NOTE — Patient Instructions (Signed)
Medication Instructions:   Try taking your iron tablet at least 2 to 3 times a day  No other changes  *If you need a refill on your cardiac medications before your next appointment, please call your pharmacy*   Lab Work: Not needed    Testing/Procedures: Not needed   Follow-Up: At [facility], you and your health needs are our priority.  As part of our continuing mission to provide you with exceptional heart care, we have created designated Provider Care Teams.  These Care Teams include your primary Cardiologist (physician) and Advanced Practice Providers (APPs -  Physician Assistants and Nurse Practitioners) who all work together to provide you with the care you need, when you need it.     Your next appointment:   6 month(s)  The format for your next appointment:   In Person  Provider:   Glenetta Hew, MD

## 2020-01-27 ENCOUNTER — Ambulatory Visit (INDEPENDENT_AMBULATORY_CARE_PROVIDER_SITE_OTHER): Payer: Medicare Other | Admitting: *Deleted

## 2020-01-27 DIAGNOSIS — I255 Ischemic cardiomyopathy: Secondary | ICD-10-CM

## 2020-01-27 LAB — CUP PACEART REMOTE DEVICE CHECK
Battery Remaining Longevity: 66 mo
Battery Remaining Percentage: 89 %
Battery Voltage: 2.98 V
Date Time Interrogation Session: 20210601020106
HighPow Impedance: 60 Ohm
Implantable Lead Implant Date: 20201130
Implantable Lead Implant Date: 20201130
Implantable Lead Location: 753858
Implantable Lead Location: 753860
Implantable Pulse Generator Implant Date: 20201130
Lead Channel Impedance Value: 440 Ohm
Lead Channel Impedance Value: 510 Ohm
Lead Channel Pacing Threshold Amplitude: 0.5 V
Lead Channel Pacing Threshold Amplitude: 0.5 V
Lead Channel Pacing Threshold Pulse Width: 0.5 ms
Lead Channel Pacing Threshold Pulse Width: 1 ms
Lead Channel Sensing Intrinsic Amplitude: 12 mV
Lead Channel Setting Pacing Amplitude: 2.5 V
Lead Channel Setting Pacing Amplitude: 2.5 V
Lead Channel Setting Pacing Pulse Width: 0.5 ms
Lead Channel Setting Pacing Pulse Width: 1 ms
Lead Channel Setting Sensing Sensitivity: 0.5 mV
Pulse Gen Serial Number: 810000193

## 2020-01-28 ENCOUNTER — Encounter: Payer: Self-pay | Admitting: Cardiology

## 2020-01-28 NOTE — Assessment & Plan Note (Signed)
Unfortunately, after initially getting some benefit from her CABG she drifted back to her reduced EF.  I suspect a lot of this has to do with A. fib, bundle branch block etc.  Unfortunately it does limit the benefit of BiV ICD.  It could be that very reason that we have seen a backslide.  In the absence of active anginal symptoms would preclude the need for ischemic evaluation at this point based on her baseline creatinine levels.  We will try to avoid any contrast unless symptoms warrant.

## 2020-01-28 NOTE — Assessment & Plan Note (Signed)
I wonder how much she would potentially benefit from home health physical therapy.  Would defer to the heart care clinic and PCP to see if this can be arranged, but I think it would be beneficial.

## 2020-01-28 NOTE — Assessment & Plan Note (Signed)
Unfortunately, I think this is definitely playing a role with her IVCD on baseline EKG.  It makes the use of CRT-D less effective.  Unable to cardiovert with amiodarone assistance.  Therefore no longer on amiodarone.  Remains on rate control Toprol and anticoagulated with Eliquis.

## 2020-01-28 NOTE — Assessment & Plan Note (Signed)
Remains on Trulicity.  Being followed by PCP.  With current creatinine level, would defer to Dr. Hollie Salk, we should probably go to consider Farxiga or Jardiance for HFrEF therapy.  This will hopefully will help [place] reduce her diuretic dosing.

## 2020-01-28 NOTE — Assessment & Plan Note (Signed)
Hard to lose weight because of deconditioning and lack of exercise.  Hard to do exercise because of significant arthritis pains.

## 2020-01-28 NOTE — Assessment & Plan Note (Signed)
Plan her blood pressure is actually borderline low today on Toprol and BiDil.  No ability to titrate further.  I am not sure she will be tolerating the spironolactone.

## 2020-01-28 NOTE — Progress Notes (Signed)
Remote ICD transmission.   

## 2020-01-28 NOTE — Assessment & Plan Note (Signed)
Remains on very low-dose rosuvastatin. On last check, LDL was 60 which is very beneficial.

## 2020-01-28 NOTE — Assessment & Plan Note (Signed)
Chronic, stable symptoms.  Edema seems to be at his baseline. We talked about sliding scale. She has BiV ICD in place.  Unfortunately has A. fib 700 restore sinus rhythm.  Plan: Continue current dose of Toprol-switch from carvedilol for more heart rate responsiveness then blood pressure control.  Was also converted to BiDil in lieu of ACE inhibitor/ARB.  We talked about sliding scale torsemide based on weights.  Discussed salt restriction and generalized fluid restriction.  Also limiting what she actually drinks.  Unfortunately, this is difficult because she enjoys to cook.  She can stand up for an hour or so without getting short of breath.  She will continue to follow-up with Dr. Aundra Dubin

## 2020-01-28 NOTE — Assessment & Plan Note (Signed)
Referred for CABG in 2015.  Has not really noticed any anginal symptoms despite being in A. fib.   Plan: Continue with apixaban and no aspirin Plavix.  She is on Zetia in addition to low-dose Crestor taking intermittently.  Most tolerated. She is on Toprol plus BiDil for blood pressure/CHF/afterload reduction.  In the absence of active anginal symptoms, would probably hold off on ischemic evaluation.[name]

## 2020-02-02 ENCOUNTER — Ambulatory Visit (INDEPENDENT_AMBULATORY_CARE_PROVIDER_SITE_OTHER): Payer: Medicare Other

## 2020-02-02 DIAGNOSIS — Z95810 Presence of automatic (implantable) cardiac defibrillator: Secondary | ICD-10-CM | POA: Diagnosis not present

## 2020-02-02 DIAGNOSIS — I5042 Chronic combined systolic (congestive) and diastolic (congestive) heart failure: Secondary | ICD-10-CM

## 2020-02-04 NOTE — Progress Notes (Signed)
EPIC Encounter for ICM Monitoring  Patient Name: Elaine [name] is a 79 y.o. female Date: 02/04/2020 Primary Care Physican: Marton Redwood, MD Primary Cardiologist:Harding/McLean Electrophysiologist:Camnitz Bi-V Pacing:93% 12/30/2019 Weight:223.8lbs    Spoke with patient and reports feeling well at this time.  Denies fluid symptoms.    CorVue thoracic impedance normal.  Prescribed: Torsemide 20 mg take60 mgtwice a day   Labs: 01/06/2020 Creatinine 2.46, BUN 60, Potassium 3.6, Sodium 136, GFR 18-21 12/17/2019 Creatinine 2.69, BUN 60, Potassium 4.2, Sodium 135, GFR 16-19  12/05/2019 Creatinine 2.59, BUN 48, Potassium 3.7, Sodium 138, GFR 17-20 11/25/2019 Creatinine 2.32, BUN 39, Potassium 3.3, Sodium 138, GFR 19-22  A complete set of results can be found in Results Review.  Recommendations:No changes and encouraged to call if experiencing any fluid symptoms.  Follow-up plan: ICM clinic phone appointment on7/07/2020. 91 day device clinic remote transmission 04/27/2020. Office appt 04/22/2020 with Dr Aundra Dubin.   Copy of ICM check sent to [facility].  3 month ICM trend: 02/02/2020    1 Year ICM trend:       [name], RN 02/04/2020 9:07 AM

## 2020-02-18 ENCOUNTER — Telehealth: Payer: Self-pay

## 2020-02-18 NOTE — Telephone Encounter (Signed)
Merlin Alert for BIV pacing < limit. Presenting: VS 100bpm. Significant recent decrease in BIV pacing.  BiV pacing percentage now down to 91%.    Pt medications include Metoprolol 100mg  Daily.    Spoke with pt, she denies any current cardiac symptoms, but did notice this morning that she has had 4.5 lb weight gain since Monday.  Her weight today was 227.  She denies any increased SOB, cough or swelling.  She normally takes Torsemide 60mg  BID and noted that she missed her AM dose yesterday.  She has been compliant with all other meds.

## 2020-02-19 NOTE — Telephone Encounter (Signed)
Left message for pt advising of MD recommendation for f/u with APP in clinic, advised would forward message to scheduling to contact her.

## 2020-02-19 NOTE — Telephone Encounter (Signed)
Please follow-up with EP app to address low BiV pacing and volume issues.

## 2020-03-04 ENCOUNTER — Telehealth: Payer: Self-pay | Admitting: Cardiology

## 2020-03-04 DIAGNOSIS — R0600 Dyspnea, unspecified: Secondary | ICD-10-CM

## 2020-03-04 DIAGNOSIS — I5042 Chronic combined systolic (congestive) and diastolic (congestive) heart failure: Secondary | ICD-10-CM

## 2020-03-04 DIAGNOSIS — R0609 Other forms of dyspnea: Secondary | ICD-10-CM

## 2020-03-04 DIAGNOSIS — R600 Localized edema: Secondary | ICD-10-CM

## 2020-03-04 DIAGNOSIS — Z79899 Other long term (current) drug therapy: Secondary | ICD-10-CM

## 2020-03-04 NOTE — Telephone Encounter (Signed)
Increase torsemide to 80 mg bid x 3 days then 80 qam/60 qpm.  She will need BMET on Monday or Tuesday

## 2020-03-04 NOTE — Telephone Encounter (Signed)
Returned the call to the patient. She stated that she has gained 7 pounds in 8 days. Her current weight this morning was 236.8 pounds. She stated that the edema extends from her legs to her ankles bilaterally. The swelling does not get better overnight. She denies increased shortness of breath except for exertion. She has been urinating less since Sunday.  She is currently taking 60 mg of torsemide twice daily.  She has been watching her sodium intake and elevates her legs as much as possible.

## 2020-03-04 NOTE — Telephone Encounter (Signed)
New Message    Pt c/o swelling: STAT is pt has developed SOB within 24 hours  1) How much weight have you gained and in what time span? 7 pounds in 8 days   2) If swelling, where is the swelling located? Swelling in both feet and arms   3) Are you currently taking a fluid pill? torsemide (DEMADEX) 20 MG tablet pt is taking 3 in the morning and 3 in the evening   4) Are you currently SOB? Pt has SOB but she says she is not in immediate danger   5) Do you have a log of your daily weights (if so, list)? Yes   6) Have you gained 3 pounds in a day or 5 pounds in a week? Yes   7) Have you traveled recently? Yes

## 2020-03-05 MED ORDER — TORSEMIDE 20 MG PO TABS: ORAL_TABLET | ORAL | 2 refills | Status: DC

## 2020-03-05 NOTE — Telephone Encounter (Signed)
Patient hasn't heard back, she wants to know what she needs to do.

## 2020-03-05 NOTE — Telephone Encounter (Signed)
Spoke to patient. Instruction given  Change in torsemide  80 mg twice a day x 3 days then increase to 80 mg in morning and 60 mg in the evening Patient will be going to have device checked on Tuesday at [address] office. Patient is aware to have lab _BMP drawn at that time

## 2020-03-05 NOTE — Addendum Note (Signed)
Addended by: Raiford Simmonds on: 03/05/2020 03:27 PM   Modules accepted: Orders

## 2020-03-08 ENCOUNTER — Ambulatory Visit (INDEPENDENT_AMBULATORY_CARE_PROVIDER_SITE_OTHER): Payer: Medicare Other

## 2020-03-08 DIAGNOSIS — I5042 Chronic combined systolic (congestive) and diastolic (congestive) heart failure: Secondary | ICD-10-CM

## 2020-03-08 DIAGNOSIS — Z95810 Presence of automatic (implantable) cardiac defibrillator: Secondary | ICD-10-CM

## 2020-03-08 NOTE — Progress Notes (Signed)
Electrophysiology Office Note Date: 03/09/2020  ID:  Elaine [name], DOB 1941/06/30, MRN [id number]  PCP: Marton Redwood, MD Primary Cardiologist: Glenetta Hew, MD Electrophysiologist: Constance Haw, MD   CC: Routine ICD follow-up  Elaine [name] is a 79 y.o. female seen today for Will Meredith Leeds, MD for routine electrophysiology followup.  Since last being seen in our clinic the patient reports doing OK. She recently had her torsemide adjusted and feels like she is doing slightly better.  she denies chest pain, palpitations, dyspnea, PND, orthopnea, nausea, vomiting, dizziness, syncope, weight gain, or early satiety. She has not had ICD shocks.   Device History: St. Jude BiV ICD implanted 07/28/2019 for CHF and LBBB History of appropriate therapy: No  Past Medical History:  Diagnosis Date  . Acute myocardial infarction of anterior wall (HCC) 10/20/2013   Afib, LBBB  . Acute pulmonary edema with congestive heart failure ([place]) 10/20/2013   Resolved  . Arthritis   . Atrial fibrillation ([place]) 10/20/2013   On Warfarin  . CAD, multiple vessel 10/20/2013   LM, LAD & RCA --> CABG; MYOVIEW 12/'15: Intermediate Risk would large anterior, anteroapical segment the apex possible infarct and peri-infarct ischemia  . CHF (congestive heart failure) (HCC)    EF 40-45%.  . Chronic kidney disease   . COPD (chronic obstructive pulmonary disease) ([place])   . Diabetic neuropathy ([place])   . Heart murmur    Likely related to aortic sclerosis  . Hyperlipidemia   . Hypertension   . Hypothyroidism   . Ischemic cardiomyopathy - Notable Improvement in EF post CABG 10/20/2013   Echo 2/23: EF 20-25%; mild LVH. anteroseptal Akinesis; mid-apical anterior, inferior & inferoseptal + apical-lateral akinesis; G 1 DDysfxn.; Mild-Mod LA dil; mild MR; Mod PHTN;; F/u Echo 12/2013: EF 40-45%, septal & apical HK, mild LVH; Mod LA dilation  . Left bundle branch block (LBBB) on electrocardiogram   . Mild mitral  regurgitation by prior echocardiogram    Mild-Mod MR on Echo  . Morbid obesity ([place])   . OSTEOARTHRITIS 08/06/2006  . S/P CABG x 3 10/23/2013   LIMA to LAD, SVG to OM2, SVG to RPLB, EVH via right thigh  . Type II diabetes mellitus with complication ([place]) 74/16/3845   off rx since heart attack 12/15-dr told could stay off if keep cbg under 150  . UTI (lower urinary tract infection) 09/29/14   ongoing now. started rx 09/28/14   Past Surgical History:  Procedure Laterality Date  . ABDOMINAL HYSTERECTOMY    . BIV ICD INSERTION CRT-D N/A 07/28/2019   Procedure: BIV ICD INSERTION CRT-D;  Surgeon: Constance Haw, MD;  Location: [place] [facility];  Service: Cardiovascular;  Laterality: N/A;  . BREAST LUMPECTOMY W/ NEEDLE LOCALIZATION Right 10/06/2014   Dr Georgette Dover  . BREAST LUMPECTOMY WITH NEEDLE LOCALIZATION Right 10/06/2014   Procedure: RIGHT BREAST LUMPECTOMY WITH NEEDLE LOCALIZATION;  Surgeon: Donnie Mesa, MD;  Location: [place];  Service: General;  Laterality: Right;  . CARDIOVERSION N/A 10/22/2019   Procedure: CARDIOVERSION;  Surgeon: Larey Dresser, MD;  Location: [facility] ENDOSCOPY;  Service: Cardiovascular;  Laterality: N/A;  . CLIPPING OF ATRIAL APPENDAGE N/A 10/23/2013   Procedure: CLIPPING OF ATRIAL APPENDAGE;  Surgeon: Rexene Alberts, MD;  Location: [place];  Service: Open Heart Surgery;  Laterality: N/A;  . 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  . CORONARY ARTERY BYPASS GRAFT N/A 10/23/2013   Procedure: CORONARY ARTERY BYPASS GRAFTING (CABG);  Surgeon: Valentina Gu  Roxy Manns, MD;  Location: [place] OR;  Service: Open Heart Surgery: LIMA-LAD, SVG-OM2, SVG-RPL  . INTRAOPERATIVE TRANSESOPHAGEAL ECHOCARDIOGRAM N/A 10/23/2013   Procedure: INTRAOPERATIVE TRANSESOPHAGEAL ECHOCARDIOGRAM;  Surgeon: Rexene Alberts, MD;  Location: [place];  Service: Open Heart Surgery;  Laterality: N/A;  . LEFT HEART CATHETERIZATION WITH CORONARY ANGIOGRAM N/A 10/20/2013   Procedure: LEFT HEART CATHETERIZATION WITH CORONARY  ANGIOGRAM;  Surgeon: Leonie Man, MD;  Location: [facility] CATH LAB;  Service: Cardiovascular: Distal LM ~70%, LAD - ostial 70%, prox 80, 95 & 95%; RCA prox 70%, mid 80%; minimal Cx disease  . LEFT HEART CATHETERIZATION WITH CORONARY/GRAFT ANGIOGRAM N/A 09/08/2014   Procedure: LEFT HEART CATHETERIZATION WITH Beatrix Fetters;  Surgeon: Leonie Man, MD;  Location: [facility] CATH LAB: Indication: Abnormal Myoview. Occluded LAD after 70 and 80% left main. Diffuse RCA disease. Widely patent grafts to moderately diseased artery vessels. Mild-moderate LV dysfunction and chronic A. fib.  [name] NM MYOVIEW LTD  08/14/2014   Lexi scan: INTERMEDIATE RISK - large, severe intensity perfusion defect in anterior wall, apex and inferior apex that is partly reversible. This suggests either scar or possible hibernating myocardium. Nondeviated.-> Likely scar. No change in coronary anatomy with patent grafts.  [name] TOTAL HIP ARTHROPLASTY  2010, 2011   right 2010, left 2011  . TRANSTHORACIC ECHOCARDIOGRAM  12/2013   Mildly dilated left ventricle with mild to moderately reduced function. EF 40-45% (notable improvement from February 2015). Septal and apical hypokinesis. Mild LA dilation.    Current Outpatient Medications  Medication Sig Dispense Refill  . acetaminophen (TYLENOL) 650 MG CR tablet Take 1,300 mg by mouth 2 (two) times daily.    [name] albuterol (PROAIR HFA) 108 (90 Base) MCG/ACT inhaler Inhale 2 puffs into the lungs every 6 (six) hours as needed for wheezing or shortness of breath.    [name] apixaban (ELIQUIS) 5 MG TABS tablet Take 1 tablet (5 mg total) by mouth 2 (two) times daily. Resume Saturday evening. 60 tablet   . Ascorbic Acid (VITAMIN C) 1000 MG tablet Take 2,000 mg by mouth daily.    . AZO-CRANBERRY PO Take 1 tablet by mouth 2 (two) times daily.     . carboxymethylcellulose (REFRESH PLUS) 0.5 % SOLN Place 1 drop into the left eye daily.    . Coenzyme Q10 (COQ-10) 100 MG CAPS Take 100 mg by mouth daily.    .  colchicine 0.6 MG tablet Take 0.6 mg by mouth as needed.     . Dulaglutide (TRULICITY) 0.35 KK/9.3GH SOPN Inject 0.75 mg into the skin every Sunday at 6pm.     . ezetimibe (ZETIA) 10 MG tablet Take 10 mg by mouth daily.    . ferrous sulfate 325 (65 FE) MG tablet Take 325 mg by mouth every other day.    . fluticasone (CUTIVATE) 0.05 % cream APPLY TO AFFECTED AREAS (FACE AND EARS) UP TO TWICE A DAY OR AS NEEDED    . isosorbide-hydrALAZINE (BIDIL) 20-37.5 MG tablet Take 0.5 tablets by mouth 3 (three) times daily. 45 tablet 5  . levothyroxine (SYNTHROID) 112 MCG tablet Take 1 tablet (112 mcg total) by mouth daily before breakfast. 30 tablet 12  . metoprolol succinate (TOPROL-XL) 100 MG 24 hr tablet Take 1 tablet (100 mg total) by mouth daily. Take with or immediately following a meal. 30 tablet 2  . Misc Natural Products (JOINT HEALTH PO) Take 30 mLs by mouth daily.    . Multiple Vitamin (MULTIVITAMIN WITH MINERALS) TABS tablet Take 1 tablet by mouth  daily.    . Multiple Vitamins-Minerals (PRESERVISION AREDS 2) CAPS Take 1 capsule by mouth 2 (two) times daily.    . rosuvastatin (CRESTOR) 5 MG tablet Take 5 mg by mouth See admin instructions. Take one tablet (5 mg) by mouth at night on Sunday, Tuesday, Thursday, Saturday    . torsemide (DEMADEX) 20 MG tablet Take 80 mg ( 4 tablets) in the morning and 60 mg ( 3 tablets) in the evening 540 tablet 2  . triamcinolone cream (KENALOG) 0.1 % APPLY TO THE AFFECTED AREA UP TO TWICE DAILY OR AS NEEDED (NO TO FACE GROIN AXILLA)    . umeclidinium-vilanterol (ANORO ELLIPTA) 62.5-25 MCG/INH AEPB Inhale 1 puff into the lungs daily.     No current facility-administered medications for this visit.    Allergies:   Crestor [rosuvastatin calcium], Allopurinol, and Tape   Social History: Social History   Socioeconomic History  . Marital status: Widowed    Spouse name: Not on file  . Number of children: Not on file  . Years of education: Not on file  . Highest  education level: Not on file  Occupational History  . Occupation: retired  Tobacco Use  . Smoking status: Former Smoker    Packs/day: 0.50    Years: 39.00    Pack years: 19.50    Types: Cigarettes    Quit date: 08/28/1993    Years since quitting: 26.5  . Smokeless tobacco: Never Used  Vaping Use  . Vaping Use: Never used  Substance and Sexual Activity  . Alcohol use: No    Comment: quit 95  . Drug use: No  . Sexual activity: Not on file  Other Topics Concern  . Not on file  Social History Narrative   Lives with husband and son in [place].  She is currently retired.   Former smoker quit in 1995.   Social Determinants of Health   Financial Resource Strain: Medium Risk  . Difficulty of Paying Living Expenses: Somewhat hard  Food Insecurity: No Food Insecurity  . Worried About Charity fundraiser in the Last Year: Never true  . Ran Out of Food in the Last Year: Never true  Transportation Needs: No Transportation Needs  . Lack of Transportation (Medical): No  . Lack of Transportation (Non-Medical): No  Physical Activity:   . Days of Exercise per Week:   . Minutes of Exercise per Session:   Stress:   . Feeling of Stress :   Social Connections:   . Frequency of Communication with Friends and Family:   . Frequency of Social Gatherings with Friends and Family:   . Attends Religious Services:   . Active Member of Clubs or Organizations:   . Attends Archivist Meetings:   [name] Marital Status:   Intimate Partner Violence:   . Fear of Current or Ex-Partner:   . Emotionally Abused:   [name] Physically Abused:   . Sexually Abused:     Family History: Family History  Adopted: Yes  Problem Relation Age of Onset  . Other Mother   . Hypertension Mother   . Colon cancer Neg Hx   . Esophageal cancer Neg Hx   . Rectal cancer Neg Hx   . Stomach cancer Neg Hx     Review of Systems: All other systems reviewed and are otherwise negative except as noted above.   Physical  Exam: Vitals:   03/09/20 1122  BP: (!) 100/58  Pulse: 83  SpO2: 98%  Weight: 228  lb 4.8 oz (103.6 kg)  Height: 5' 2"  (1.575 m)     GEN- The patient is well appearing, alert and oriented x 3 today.   HEENT: normocephalic, atraumatic; sclera clear, conjunctiva pink; hearing intact; oropharynx clear; neck supple, no JVP Lymph- no cervical lymphadenopathy Lungs- Clear to ausculation bilaterally, normal work of breathing.  No wheezes, rales, rhonchi Heart- Regular rate and rhythm, no murmurs, rubs or gallops, PMI not laterally displaced GI- soft, non-tender, non-distended, bowel sounds present, no hepatosplenomegaly Extremities- no clubbing, cyanosis, or edema; DP/PT/radial pulses 2+ bilaterally MS- no significant deformity or atrophy Skin- warm and dry, no rash or lesion; ICD pocket well healed Psych- euthymic mood, full affect Neuro- strength and sensation are intact  ICD interrogation- reviewed in detail today,  See PACEART report  EKG:  EKG is not ordered today. Device interrogated.  Recent Labs: 09/26/2019: B Natriuretic Peptide 371.4 10/20/2019: Magnesium 2.8 01/06/2020: ALT 18; BUN 60; Creatinine, Ser 2.46; Hemoglobin 12.0; Platelets 194; Potassium 3.6; Sodium 136   Wt Readings from Last 3 Encounters:  03/09/20 228 lb 4.8 oz (103.6 kg)  01/20/20 230 lb (104.3 kg)  01/16/20 231 lb (104.8 kg)     Other studies Reviewed: Additional studies/ records that were reviewed today include: Previous remotes and device checks, HF notes, EP notes.   Assessment and Plan:  1.  Chronic systolic dysfunction s/p St. Jude CRT-D  euvolemic today Stable on an appropriate medical regimen Normal ICD function See Pace Art report No changes today Volume status looks OK on exam with torsemide adjustments. Labs today.  Currently on Lasix, Toprol-XL, losartan. She had not tolerated higher doses of any of these medications.   Her BiV pacing has decreased. Her lead thresholds are stable and VVIR.  She appears to be intermittently conducing her AF at rates higher than her LRL (which is already increased to 80). I do not see a way to adjust her device to improve this. May need to consider AV nodal ablation. She is a poor candidate to go back on amiodarone for rate control with unclear component of COPD (She has O2 at home but does not use)  2.  Permanent atrial fibrillation:  Continue Eliquis for CHA2DS2-VASc of 6.  No changes.   3. CAD s/p CABG Denies ischemic symptoms.   Current medicines are reviewed at length with the patient today.   The patient does not have concerns regarding her medicines.  The following changes were made today:  none  Labs/ tests ordered today include:  Orders Placed This Encounter  Procedures  . Comp Met (CMET)  . Magnesium  . TSH   Disposition:   Follow up with Dr. Curt Bears 6 months. Sooner pending discussion of next steps.   Jacalyn Lefevre, PA-C  03/09/2020 3:04 PM  [address] [place] [address] [phone] (office) [phone] (fax)

## 2020-03-09 ENCOUNTER — Encounter: Payer: Self-pay | Admitting: Student

## 2020-03-09 ENCOUNTER — Other Ambulatory Visit: Payer: Self-pay

## 2020-03-09 ENCOUNTER — Ambulatory Visit (INDEPENDENT_AMBULATORY_CARE_PROVIDER_SITE_OTHER): Payer: Medicare Other | Admitting: Student

## 2020-03-09 ENCOUNTER — Other Ambulatory Visit: Payer: Medicare Other

## 2020-03-09 VITALS — BP 100/58 | HR 83 | Ht 62.0 in | Wt 228.3 lb

## 2020-03-09 DIAGNOSIS — Z79899 Other long term (current) drug therapy: Secondary | ICD-10-CM | POA: Diagnosis not present

## 2020-03-09 DIAGNOSIS — I5042 Chronic combined systolic (congestive) and diastolic (congestive) heart failure: Secondary | ICD-10-CM | POA: Diagnosis not present

## 2020-03-09 DIAGNOSIS — Z95810 Presence of automatic (implantable) cardiac defibrillator: Secondary | ICD-10-CM

## 2020-03-09 DIAGNOSIS — R600 Localized edema: Secondary | ICD-10-CM | POA: Diagnosis not present

## 2020-03-09 DIAGNOSIS — I4821 Permanent atrial fibrillation: Secondary | ICD-10-CM | POA: Diagnosis not present

## 2020-03-09 LAB — CUP PACEART INCLINIC DEVICE CHECK
Battery Remaining Longevity: 67 mo
Brady Statistic RA Percent Paced: 0 %
Brady Statistic RV Percent Paced: 84 %
Date Time Interrogation Session: 20210713145419
HighPow Impedance: 60.75 Ohm
Implantable Lead Implant Date: 20201130
Implantable Lead Implant Date: 20201130
Implantable Lead Location: 753858
Implantable Lead Location: 753860
Implantable Pulse Generator Implant Date: 20201130
Lead Channel Impedance Value: 500 Ohm
Lead Channel Impedance Value: 500 Ohm
Lead Channel Pacing Threshold Amplitude: 0.5 V
Lead Channel Pacing Threshold Amplitude: 0.5 V
Lead Channel Pacing Threshold Amplitude: 0.75 V
Lead Channel Pacing Threshold Amplitude: 0.75 V
Lead Channel Pacing Threshold Pulse Width: 0.5 ms
Lead Channel Pacing Threshold Pulse Width: 0.5 ms
Lead Channel Pacing Threshold Pulse Width: 1 ms
Lead Channel Pacing Threshold Pulse Width: 1 ms
Lead Channel Sensing Intrinsic Amplitude: 12 mV
Lead Channel Setting Pacing Amplitude: 2.5 V
Lead Channel Setting Pacing Amplitude: 2.5 V
Lead Channel Setting Pacing Pulse Width: 0.5 ms
Lead Channel Setting Pacing Pulse Width: 1 ms
Lead Channel Setting Sensing Sensitivity: 0.5 mV
Pulse Gen Serial Number: 810000193

## 2020-03-09 LAB — COMPREHENSIVE METABOLIC PANEL
ALT: 34 IU/L — ABNORMAL HIGH (ref 0–32)
AST: 42 IU/L — ABNORMAL HIGH (ref 0–40)
Albumin/Globulin Ratio: 1.2 (ref 1.2–2.2)
Albumin: 3.6 g/dL — ABNORMAL LOW (ref 3.7–4.7)
Alkaline Phosphatase: 120 IU/L (ref 48–121)
BUN/Creatinine Ratio: 18 (ref 12–28)
BUN: 42 mg/dL — ABNORMAL HIGH (ref 8–27)
Bilirubin Total: 0.9 mg/dL (ref 0.0–1.2)
CO2: 24 mmol/L (ref 20–29)
Calcium: 8.9 mg/dL (ref 8.7–10.3)
Chloride: 94 mmol/L — ABNORMAL LOW (ref 96–106)
Creatinine, Ser: 2.32 mg/dL — ABNORMAL HIGH (ref 0.57–1.00)
GFR calc Af Amer: 22 mL/min/{1.73_m2} — ABNORMAL LOW (ref >59)
GFR calc non Af Amer: 19 mL/min/{1.73_m2} — ABNORMAL LOW (ref >59)
Globulin, Total: 3.1 g/dL (ref 1.5–4.5)
Glucose: 165 mg/dL — ABNORMAL HIGH (ref 65–99)
Potassium: 4 mmol/L (ref 3.5–5.2)
Sodium: 135 mmol/L (ref 134–144)
Total Protein: 6.7 g/dL (ref 6.0–8.5)

## 2020-03-09 LAB — TSH: TSH: 1.78 u[IU]/mL (ref 0.450–4.500)

## 2020-03-09 LAB — MAGNESIUM: Magnesium: 2.1 mg/dL (ref 1.6–2.3)

## 2020-03-09 NOTE — Patient Instructions (Signed)
Medication Instructions:  *If you need a refill on your cardiac medications before your next appointment, please call your pharmacy*  Lab Work: Your physician has recommended that you have lab work today: CMET, Magnesium Level, and TSH  If you have labs (blood work) drawn today and your tests are completely normal, you will receive your results only by: [name] MyChart Message (if you have MyChart) OR . A paper copy in the mail If you have any lab test that is abnormal or we need to change your treatment, we will call you to review the results.  Follow-Up: At [facility], you and your health needs are our priority.  As part of our continuing mission to provide you with exceptional heart care, we have created designated Provider Care Teams.  These Care Teams include your primary Cardiologist (physician) and Advanced Practice Providers (APPs -  Physician Assistants and Nurse Practitioners) who all work together to provide you with the care you need, when you need it.  We recommend signing up for the patient portal called "MyChart".  Sign up information is provided on this After Visit Summary.  MyChart is used to connect with patients for Virtual Visits (Telemedicine).  Patients are able to view lab/test results, encounter notes, upcoming appointments, etc.  Non-urgent messages can be sent to your provider as well.   To learn more about what you can do with MyChart, go to [email or web address].    Your next appointment:   Your physician wants you to follow-up in: 53 MONTHS with Dr. Curt Bears. You will receive a reminder letter in the mail two months in advance. If you don't receive a letter, please call our office to schedule the follow-up appointment.  Remote monitoring is used to monitor your ICD from home. This monitoring reduces the number of office visits required to check your device to one time per year. It allows [place] to keep an eye on the functioning of your device to ensure it is working  properly. You are scheduled for a device check from home on 04/27/20. You may send your transmission at any time that day. If you have a wireless device, the transmission will be sent automatically. After your physician reviews your transmission, you will receive a postcard with your next transmission date.  The format for your next appointment:   In Person with Allegra Lai, MD

## 2020-03-10 ENCOUNTER — Telehealth: Payer: Self-pay

## 2020-03-10 NOTE — Progress Notes (Signed)
EPIC Encounter for ICM Monitoring  Patient Name: Elaine [name] is a 79 y.o. female Date: 03/10/2020 Primary Care Physican: Marton Redwood, MD Primary Cardiologist:Harding/McLean Electrophysiologist:Camnitz Bi-V Pacing:93% 03/10/2020 Weight:228lbs    Spoke with patient and reports feeling well at this time.  Denies fluid symptoms.  Pt seen by Oda Kilts 03/09/2020 for decreased BiV pacing and Torsemide adjustments.    Weight trending back toward baseline and swelling of feet are improving.   CorVue thoracic impedance returned to normal after increased Torsemide dosage.  Prescribed: Torsemide 20 mg take4 tablets (80 mg total) every morning and take 3 tablets (60 mg total) every evening.   Labs: 03/09/2020 Creatinine 2.32, BUN 42, Potassium 4.0, Sodium 135, GFR 19-22 01/06/2020 Creatinine 2.46, BUN 60, Potassium 3.6, Sodium 136, GFR 18-21 12/17/2019 Creatinine 2.69, BUN 60, Potassium 4.2, Sodium 135, GFR 16-19  12/05/2019 Creatinine 2.59, BUN 48, Potassium 3.7, Sodium 138, GFR 17-20 11/25/2019 Creatinine 2.32, BUN 39, Potassium 3.3, Sodium 138, GFR 19-22  A complete set of results can be found in Results Review.  Recommendations:No changes and encouraged to call if experiencing any fluid symptoms.  Follow-up plan: ICM clinic phone appointment on8/16/2021. 91 day device clinic remote transmission 04/27/2020.    EP/Cardiology Office Visits: 04/22/2020 with Dr. Aundra Dubin.    Copy of ICM check sent to Dr. Curt Bears.   3 month ICM trend: 03/10/2020    1 Year ICM trend:       Elaine Billings, RN 03/10/2020 12:56 PM

## 2020-03-10 NOTE — Telephone Encounter (Signed)
I called the pt to get her to send missed transmission. Transmission received.

## 2020-04-12 ENCOUNTER — Ambulatory Visit (INDEPENDENT_AMBULATORY_CARE_PROVIDER_SITE_OTHER): Payer: Medicare Other

## 2020-04-12 DIAGNOSIS — Z95810 Presence of automatic (implantable) cardiac defibrillator: Secondary | ICD-10-CM | POA: Diagnosis not present

## 2020-04-12 DIAGNOSIS — I5042 Chronic combined systolic (congestive) and diastolic (congestive) heart failure: Secondary | ICD-10-CM | POA: Diagnosis not present

## 2020-04-16 ENCOUNTER — Telehealth: Payer: Self-pay

## 2020-04-16 NOTE — Progress Notes (Signed)
EPIC Encounter for ICM Monitoring  Patient Name: Elaine [name] is a 80 y.o. female Date: 04/16/2020 Primary Care Physican: Marton Redwood, MD Primary Cardiologist:Harding/[facility] Electrophysiologist:Camnitz Bi-V Pacing:78% 03/10/2020 Weight:228lbs    Attempted call to patient and unable to reach.  Left message to return call. Transmission reviewed.    CorVue thoracic impedance normal.  Prescribed: Torsemide 20 mg take4 tablets (80 mg total) every morning and take 3 tablets (60 mg total) every evening.   Labs: 03/09/2020 Creatinine 2.32, BUN 42, Potassium 4.0, Sodium 135, GFR 19-22 01/06/2020 Creatinine2.46, BUN60, Potassium3.6, Sodium136, QQP61-95 12/17/2019 Creatinine2.69, BUN60, Potassium4.2, Sodium135, KDT26-71  12/05/2019 Creatinine2.59, BUN48, Potassium3.7, [id number], JAS50-53 11/25/2019 Creatinine2.32, BUN39, Potassium3.3, [id number], XTK24-09 A complete set of results can be found in Results Review.  Recommendations:Unable to reach.    Follow-up plan: ICM clinic phone appointment on9/20/2021. 91 day device clinic remote transmission8/31/2021.    EP/Cardiology Office Visits: 04/22/2020 with Dr. Aundra Dubin.    Copy of ICM check sent to Dr. Curt Bears.    3 month ICM trend: 04/12/2020    1 Year ICM trend:       [name], RN 04/16/2020 12:53 PM

## 2020-04-16 NOTE — Telephone Encounter (Signed)
Remote ICM transmission received.  Attempted call to patient regarding ICM remote transmission and left message per DPR to return call.   

## 2020-04-16 NOTE — Progress Notes (Signed)
Spoke with patient and she reports her feet stay swollen.  She said she elevates feet at night but no worse than usual.  She does not have any other fluid symptoms.  Transmission reviewed.  No changes and encouraged to call if experiencing any fluid symptoms.

## 2020-04-22 ENCOUNTER — Ambulatory Visit (HOSPITAL_COMMUNITY)
Admission: RE | Admit: 2020-04-22 | Discharge: 2020-04-22 | Disposition: A | Discharge status: Home / Self Care | Payer: Medicare Other | Source: Ambulatory Visit | Attending: Cardiology | Admitting: Cardiology

## 2020-04-22 ENCOUNTER — Other Ambulatory Visit: Payer: Self-pay

## 2020-04-22 VITALS — BP 116/62 | HR 100 | Wt 227.0 lb

## 2020-04-22 DIAGNOSIS — Z8249 Family history of ischemic heart disease and other diseases of the circulatory system: Secondary | ICD-10-CM | POA: Diagnosis not present

## 2020-04-22 DIAGNOSIS — E785 Hyperlipidemia, unspecified: Secondary | ICD-10-CM

## 2020-04-22 DIAGNOSIS — Z7901 Long term (current) use of anticoagulants: Secondary | ICD-10-CM | POA: Diagnosis not present

## 2020-04-22 DIAGNOSIS — Z95810 Presence of automatic (implantable) cardiac defibrillator: Secondary | ICD-10-CM | POA: Insufficient documentation

## 2020-04-22 DIAGNOSIS — E1122 Type 2 diabetes mellitus with diabetic chronic kidney disease: Secondary | ICD-10-CM | POA: Diagnosis not present

## 2020-04-22 DIAGNOSIS — I5042 Chronic combined systolic (congestive) and diastolic (congestive) heart failure: Secondary | ICD-10-CM

## 2020-04-22 DIAGNOSIS — Z951 Presence of aortocoronary bypass graft: Secondary | ICD-10-CM | POA: Insufficient documentation

## 2020-04-22 DIAGNOSIS — I482 Chronic atrial fibrillation, unspecified: Secondary | ICD-10-CM | POA: Insufficient documentation

## 2020-04-22 DIAGNOSIS — I447 Left bundle-branch block, unspecified: Secondary | ICD-10-CM | POA: Insufficient documentation

## 2020-04-22 DIAGNOSIS — J449 Chronic obstructive pulmonary disease, unspecified: Secondary | ICD-10-CM | POA: Diagnosis not present

## 2020-04-22 DIAGNOSIS — I5022 Chronic systolic (congestive) heart failure: Secondary | ICD-10-CM | POA: Insufficient documentation

## 2020-04-22 DIAGNOSIS — I255 Ischemic cardiomyopathy: Secondary | ICD-10-CM | POA: Insufficient documentation

## 2020-04-22 DIAGNOSIS — N183 Chronic kidney disease, stage 3 unspecified: Secondary | ICD-10-CM | POA: Insufficient documentation

## 2020-04-22 DIAGNOSIS — I2510 Atherosclerotic heart disease of native coronary artery without angina pectoris: Secondary | ICD-10-CM | POA: Insufficient documentation

## 2020-04-22 DIAGNOSIS — Z79899 Other long term (current) drug therapy: Secondary | ICD-10-CM | POA: Diagnosis not present

## 2020-04-22 DIAGNOSIS — Z87891 Personal history of nicotine dependence: Secondary | ICD-10-CM | POA: Insufficient documentation

## 2020-04-22 DIAGNOSIS — Z8701 Personal history of pneumonia (recurrent): Secondary | ICD-10-CM | POA: Insufficient documentation

## 2020-04-22 LAB — LIPID PANEL
Cholesterol: 123 mg/dL (ref 0–200)
HDL: 65 mg/dL (ref >40)
LDL Cholesterol: 46 mg/dL (ref 0–99)
Total CHOL/HDL Ratio: 1.9 RATIO
Triglycerides: 60 mg/dL (ref <150)
VLDL: 12 mg/dL (ref 0–40)

## 2020-04-22 LAB — BASIC METABOLIC PANEL
Anion gap: 12 (ref 5–15)
BUN: 38 mg/dL — ABNORMAL HIGH (ref 8–23)
CO2: 27 mmol/L (ref 22–32)
Calcium: 9.7 mg/dL (ref 8.9–10.3)
Chloride: 97 mmol/L — ABNORMAL LOW (ref 98–111)
Creatinine, Ser: 2.19 mg/dL — ABNORMAL HIGH (ref 0.44–1.00)
GFR calc Af Amer: 24 mL/min — ABNORMAL LOW (ref >60)
GFR calc non Af Amer: 21 mL/min — ABNORMAL LOW (ref >60)
Glucose, Bld: 109 mg/dL — ABNORMAL HIGH (ref 70–99)
Potassium: 3.2 mmol/L — ABNORMAL LOW (ref 3.5–5.1)
Sodium: 136 mmol/L (ref 135–145)

## 2020-04-22 MED ORDER — ISOSORB DINITRATE-HYDRALAZINE 20-37.5 MG PO TABS: 1.0000 | ORAL_TABLET | Freq: Three times a day (TID) | ORAL | 5 refills | Status: DC

## 2020-04-22 MED ORDER — TORSEMIDE 20 MG PO TABS
80.0000 mg | ORAL_TABLET | Freq: Two times a day (BID) | ORAL | 3 refills | Status: DC
Start: 2020-04-22 — End: 2020-08-18

## 2020-04-22 NOTE — Progress Notes (Signed)
PCP: Marton Redwood, MD Cardiology: Dr. Ellyn Hack HF Cardiology: Dr. Aundra Dubin  79 y.o. with history of CAD s/p CABG, COPD, CKD stage 3, and chronic systolic systolic CHF/ischemic cardiomyopathy was referred by Dr. Ellyn Hack for evaluation of CHF.  Patient was admitted in 2/15 with atrial fibrillation/RVR and chest pain.  Cath showed severe 3VD and she had CABG x 3.  Echo in 2/15 showed EF down to 20-25%. In 2018, it appears that she went back into atrial fibrillation and has remained in atrial fibrillation persistently.  DCCV was not attempted.   Most recent echo in 10/20 showed EF < 20% with normal RV.  She had a St Jude CRT-D device implanted in 11/20.  She says that she feels "90%" better with CRT.  She is followed by Dr. Hollie Salk with nephrology.   She has been in atrial fibrillation persistently, I attempted DCCV while on amiodarone but she did not convert.  Therefore, I stopped amiodarone.    Echo in 3/21 showed EF 20% with diffuse hypokinesis, mildly dilated RV with mildly decreased systolic function, PASP 49 mmHg, dilated IVC.   She returns now for followup of CHF.  Weight is up 2 lbs since last appt.  She stopped spironolactone because she thought it caused her to lose her taste (she denies COVID symptoms).  She has been more short of breath recently.  She gets short of breath walking around her house.  No chest pain.  No orthopnea/PND.  No lightheadedness.     ECG (personally reviewed): atrial fibrillation, LBBB with occasional BiV pacing (personally reviewed).  Labs (2/21): K 4.5, creatinine 2.08 => 2.99, hgb 12.8 Labs (7/21): K 4, creatinine 2.3, TSH normal  St Jude device interrogation: 77% BiV pacing  PMH: 1. CAD: s/p CABG in 2/15 with LIMA-LAD, SVG-OM2, SVG-PLV.  2. LBBB: Chronic.  3. Atrial fibrillation: Persistent since 2018.  - LA appendage clip with CBGdldkl 4. H/o aspiration PNA 5. Type 2 diabetes 6. COPD 7. CKD stage 3 8. Chronic systolic CHF: Ischemic cardiomyopathy.  She has a  St Jude BiV ICD.  - Echo (2/15): EF 20-25%.  - Echo (5/15): EF 40-45% - Echo (10/20): EF <20%, mild LVH, normal RV size and systolic function.  - Echo (3/21): EF 20% with diffuse hypokinesis, mildly dilated RV with mildly decreased systolic function, PASP 49 mmHg, dilated IVC.  9. CKD: Stage 3.   Social History   Socioeconomic History  . Marital status: Widowed    Spouse name: Not on file  . Number of children: Not on file  . Years of education: Not on file  . Highest education level: Not on file  Occupational History  . Occupation: retired  Tobacco Use  . Smoking status: Former Smoker    Packs/day: 0.50    Years: 39.00    Pack years: 19.50    Types: Cigarettes    Quit date: 08/28/1993    Years since quitting: 26.6  . Smokeless tobacco: Never Used  Vaping Use  . Vaping Use: Never used  Substance and Sexual Activity  . Alcohol use: No    Comment: quit 95  . Drug use: No  . Sexual activity: Not on file  Other Topics Concern  . Not on file  Social History Narrative   Lives with husband and son in [place].  She is currently retired.   Former smoker quit in 1995.   Social Determinants of Health   Financial Resource Strain: Medium Risk  . Difficulty of Paying Living Expenses: Somewhat  hard  Food Insecurity: No Food Insecurity  . Worried About Charity fundraiser in the Last Year: Never true  . Ran Out of Food in the Last Year: Never true  Transportation Needs: No Transportation Needs  . Lack of Transportation (Medical): No  . Lack of Transportation (Non-Medical): No  Physical Activity:   . Days of Exercise per Week: Not on file  . Minutes of Exercise per Session: Not on file  Stress:   . Feeling of Stress : Not on file  Social Connections:   . Frequency of Communication with Friends and Family: Not on file  . Frequency of Social Gatherings with Friends and Family: Not on file  . Attends Religious Services: Not on file  . Active Member of Clubs or Organizations:  Not on file  . Attends Archivist Meetings: Not on file  . Marital Status: Not on file  Intimate Partner Violence:   . Fear of Current or Ex-Partner: Not on file  . Emotionally Abused: Not on file  . Physically Abused: Not on file  . Sexually Abused: Not on file   Family History  Adopted: Yes  Problem Relation Age of Onset  . Other Mother   . Hypertension Mother   . Colon cancer Neg Hx   . Esophageal cancer Neg Hx   . Rectal cancer Neg Hx   . Stomach cancer Neg Hx    Current Outpatient Medications  Medication Sig Dispense Refill  . acetaminophen (TYLENOL) 650 MG CR tablet Take 1,300 mg by mouth 2 (two) times daily.    [name] albuterol (PROAIR HFA) 108 (90 Base) MCG/ACT inhaler Inhale 2 puffs into the lungs every 6 (six) hours as needed for wheezing or shortness of breath.    [name] apixaban (ELIQUIS) 5 MG TABS tablet Take 1 tablet (5 mg total) by mouth 2 (two) times daily. Resume Saturday evening. 60 tablet   . Ascorbic Acid (VITAMIN C) 1000 MG tablet Take 2,000 mg by mouth daily.    . AZO-CRANBERRY PO Take 1 tablet by mouth 2 (two) times daily.     . carboxymethylcellulose (REFRESH PLUS) 0.5 % SOLN Place 1 drop into the left eye daily.    . Coenzyme Q10 (COQ-10) 100 MG CAPS Take 100 mg by mouth daily.    . colchicine 0.6 MG tablet Take 0.6 mg by mouth as needed.     . Dulaglutide (TRULICITY) 5.95 GL/8.7FI SOPN Inject 0.75 mg into the skin every Sunday at 6pm.     . ezetimibe (ZETIA) 10 MG tablet Take 10 mg by mouth daily.    . ferrous sulfate 325 (65 FE) MG tablet Take 325 mg by mouth every other day.    . fluticasone (CUTIVATE) 0.05 % cream APPLY TO AFFECTED AREAS (FACE AND EARS) UP TO TWICE A DAY OR AS NEEDED    . isosorbide-hydrALAZINE (BIDIL) 20-37.5 MG tablet Take 1 tablet by mouth 3 (three) times daily. 90 tablet 5  . levothyroxine (SYNTHROID) 112 MCG tablet Take 1 tablet (112 mcg total) by mouth daily before breakfast. 30 tablet 12  . metoprolol succinate (TOPROL-XL) 100 MG  24 hr tablet Take 1 tablet (100 mg total) by mouth daily. Take with or immediately following a meal. 30 tablet 2  . Misc Natural Products (JOINT HEALTH PO) Take 30 mLs by mouth daily.    . Multiple Vitamin (MULTIVITAMIN WITH MINERALS) TABS tablet Take 1 tablet by mouth daily.    . Multiple Vitamins-Minerals (PRESERVISION AREDS 2) CAPS Take  1 capsule by mouth 2 (two) times daily.    . rosuvastatin (CRESTOR) 5 MG tablet Take 5 mg by mouth See admin instructions. Take one tablet (5 mg) by mouth at night on Sunday, Tuesday, Thursday, Saturday    . torsemide (DEMADEX) 20 MG tablet Take 4 tablets (80 mg total) by mouth 2 (two) times daily. 240 tablet 3  . triamcinolone cream (KENALOG) 0.1 % APPLY TO THE AFFECTED AREA UP TO TWICE DAILY OR AS NEEDED (NO TO FACE GROIN AXILLA)    . umeclidinium-vilanterol (ANORO ELLIPTA) 62.5-25 MCG/INH AEPB Inhale 1 puff into the lungs daily.     No current facility-administered medications for this encounter.   BP 116/62   Pulse 100   Wt 103 kg (227 lb)   SpO2 96%   BMI 41.52 kg/m  General: NAD Neck: JVP 8-9 cm, no thyromegaly or thyroid nodule.  Lungs: Clear to auscultation bilaterally with normal respiratory effort. CV: Nondisplaced PMI.  Heart irregular S1/S2, no S3/S4, no murmur.  1+ edema 1/2 to knees bilaterally.  No carotid bruit.  Normal pedal pulses.  Abdomen: Soft, nontender, no hepatosplenomegaly, no distention.  Skin: Intact without lesions or rashes.  Neurologic: Alert and oriented x 3.  Psych: Normal affect. Extremities: No clubbing or cyanosis.  HEENT: Normal.   Assessment/Plan: 1. Chronic systolic CHF: Ischemic cardiomyopathy.  St Jude CRT-D device.  Echo in 3/21 showed EF 20% with diffuse hypokinesis, mildly dilated RV with mildly decreased systolic function, PASP 49 mmHg, dilated IVC.  NYHA class III symptoms, worse recently.  She does look volume overloaded on exam. Of note, she is only BiV pacing 77% of the time.  - Increase torsemide to 80  mg bid.  BMET today and in 10 days.  - Increase Bidil to 1 tab tid.  - Continue Toprol XL 100 mg daily.  - Off spironolactone with side effects.  - GFR < 25 so will not use dapagliflozin or Entresto/ARB/ACEI.  - I will ask Dr. Curt Bears to take a look at her device, she may benefit from AV nodal ablation to allow adequate BiV pacing.  2. CAD: S/p CABG in 2015. No chest pain.  - No ASA given apixaban use.  - Continue Crestor 5 daily + Zetia, check lipids today.  3. Atrial fibrillation: Chronic now, looks like it has gone on since 2018.  I was unable to cardiovert her on amiodarone so it was stopped.  I do not think that I will be able to get her out of atrial fibrillation.  - Continue apixaban.  - See above regarding consideration of AV nodal ablation to allow BiV pacing.  4. CKD: Stage 3.  Needs BMET today.   Followup in 3 wks with NP/PA to reassess volume and renal function.  Loralie Champagne 04/22/2020

## 2020-04-22 NOTE — Patient Instructions (Signed)
Increase Bidil to 1 tab Three times a day   Increase Torsemide to 80 mg (4 tabs) Twice daily   Labs done today, your results will be available in MyChart, we will contact you for abnormal readings.  Your physician recommends that you return for lab work in: 10 days  Your physician recommends that you schedule a follow-up appointment in: 1 month  You have been referred to follow up with Dr Curt Bears, his office will call you for an appointment  If you have any questions or concerns before your next appointment please send [place] a message through [place] or call our office at [phone].    TO LEAVE A MESSAGE FOR THE NURSE SELECT OPTION 2, PLEASE LEAVE A MESSAGE INCLUDING: . YOUR NAME . DATE OF BIRTH . CALL BACK NUMBER . REASON FOR CALL**this is important as we prioritize the call backs  [place] AS LONG AS YOU CALL BEFORE 4:00 PM  At the [facility], you and your health needs are our priority. As part of our continuing mission to provide you with exceptional heart care, we have created designated Provider Care Teams. These Care Teams include your primary Cardiologist (physician) and Advanced Practice Providers (APPs- Physician Assistants and Nurse Practitioners) who all work together to provide you with the care you need, when you need it.   You may see any of the following providers on your designated Care Team at your next follow up: [name] Dr Glori Bickers . Dr Loralie Champagne . Darrick Grinder, NP . Lyda Jester, PA . Audry Riles, PharmD   Please be sure to bring in all your medications bottles to every appointment.

## 2020-04-23 ENCOUNTER — Telehealth: Payer: Self-pay

## 2020-04-23 ENCOUNTER — Telehealth (HOSPITAL_COMMUNITY): Payer: Self-pay

## 2020-04-23 MED ORDER — POTASSIUM CHLORIDE CRYS ER 20 MEQ PO TBCR: 20.0000 meq | EXTENDED_RELEASE_TABLET | Freq: Two times a day (BID) | ORAL | 11 refills | Status: DC

## 2020-04-23 NOTE — Telephone Encounter (Signed)
[facility]lin alert received 04/22/20 for decreased CRT pacing. Was 84% on 03/09/20, now 77% on 04/22/20. Per Oda Kilts, PA note on 03/09/20, he did not see a way to adjust device to increase CRT pacing, consider AV nodal ablation.  Routing Dr. Curt Bears for review.

## 2020-04-23 NOTE — Telephone Encounter (Signed)
-----   Message from Larey Dresser, MD sent at 04/22/2020 11:56 PM EDT ----- Add KCl 20 daily with BMET 1 week.

## 2020-04-23 NOTE — Telephone Encounter (Signed)
Patient advised and verbalized understanding. Rx sent in,pt has pending lab appt 9/7 will recheck labs then.   Meds ordered this encounter  Medications  . potassium chloride SA (KLOR-CON) 20 MEQ tablet    Sig: Take 1 tablet (20 mEq total) by mouth 2 (two) times daily.    Dispense:  30 tablet    Refill:  11

## 2020-04-23 NOTE — Telephone Encounter (Signed)
Pt scheduled to see Camnitz 9/14 to discuss AVN ablation

## 2020-04-27 ENCOUNTER — Ambulatory Visit (INDEPENDENT_AMBULATORY_CARE_PROVIDER_SITE_OTHER): Payer: Medicare Other | Admitting: *Deleted

## 2020-04-27 DIAGNOSIS — I255 Ischemic cardiomyopathy: Secondary | ICD-10-CM | POA: Diagnosis not present

## 2020-04-27 LAB — CUP PACEART REMOTE DEVICE CHECK
Battery Remaining Longevity: 65 mo
Battery Remaining Percentage: 86 %
Battery Voltage: 2.98 V
Date Time Interrogation Session: 20210831020010
HighPow Impedance: 62 Ohm
Implantable Lead Implant Date: 20201130
Implantable Lead Implant Date: 20201130
Implantable Lead Location: 753858
Implantable Lead Location: 753860
Implantable Pulse Generator Implant Date: 20201130
Lead Channel Impedance Value: 460 Ohm
Lead Channel Impedance Value: 490 Ohm
Lead Channel Pacing Threshold Amplitude: 0.5 V
Lead Channel Pacing Threshold Amplitude: 0.75 V
Lead Channel Pacing Threshold Pulse Width: 0.5 ms
Lead Channel Pacing Threshold Pulse Width: 1 ms
Lead Channel Sensing Intrinsic Amplitude: 12 mV
Lead Channel Setting Pacing Amplitude: 2.5 V
Lead Channel Setting Pacing Amplitude: 2.5 V
Lead Channel Setting Pacing Pulse Width: 0.5 ms
Lead Channel Setting Pacing Pulse Width: 1 ms
Lead Channel Setting Sensing Sensitivity: 0.5 mV
Pulse Gen Serial Number: 810000193

## 2020-04-28 NOTE — Progress Notes (Signed)
Remote ICD transmission.   

## 2020-05-04 ENCOUNTER — Other Ambulatory Visit: Payer: Self-pay

## 2020-05-04 ENCOUNTER — Ambulatory Visit (HOSPITAL_COMMUNITY)
Admission: RE | Admit: 2020-05-04 | Discharge: 2020-05-04 | Disposition: A | Discharge status: Home / Self Care | Payer: Medicare Other | Source: Ambulatory Visit | Attending: Internal Medicine | Admitting: Internal Medicine

## 2020-05-04 DIAGNOSIS — I5022 Chronic systolic (congestive) heart failure: Secondary | ICD-10-CM | POA: Diagnosis not present

## 2020-05-04 LAB — BASIC METABOLIC PANEL
Anion gap: 12 (ref 5–15)
BUN: 38 mg/dL — ABNORMAL HIGH (ref 8–23)
CO2: 25 mmol/L (ref 22–32)
Calcium: 9.9 mg/dL (ref 8.9–10.3)
Chloride: 103 mmol/L (ref 98–111)
Creatinine, Ser: 2.36 mg/dL — ABNORMAL HIGH (ref 0.44–1.00)
GFR calc Af Amer: 22 mL/min — ABNORMAL LOW (ref >60)
GFR calc non Af Amer: 19 mL/min — ABNORMAL LOW (ref >60)
Glucose, Bld: 207 mg/dL — ABNORMAL HIGH (ref 70–99)
Potassium: 4.5 mmol/L (ref 3.5–5.1)
Sodium: 140 mmol/L (ref 135–145)

## 2020-05-11 ENCOUNTER — Encounter: Payer: Self-pay | Admitting: Cardiology

## 2020-05-11 ENCOUNTER — Ambulatory Visit (INDEPENDENT_AMBULATORY_CARE_PROVIDER_SITE_OTHER): Payer: Medicare Other | Admitting: Cardiology

## 2020-05-11 ENCOUNTER — Other Ambulatory Visit: Payer: Self-pay

## 2020-05-11 VITALS — BP 96/64 | HR 88 | Ht 62.0 in | Wt 218.0 lb

## 2020-05-11 DIAGNOSIS — I5042 Chronic combined systolic (congestive) and diastolic (congestive) heart failure: Secondary | ICD-10-CM

## 2020-05-11 DIAGNOSIS — Z01812 Encounter for preprocedural laboratory examination: Secondary | ICD-10-CM

## 2020-05-11 DIAGNOSIS — I255 Ischemic cardiomyopathy: Secondary | ICD-10-CM | POA: Diagnosis not present

## 2020-05-11 DIAGNOSIS — I4821 Permanent atrial fibrillation: Secondary | ICD-10-CM | POA: Diagnosis not present

## 2020-05-11 LAB — CBC
Hematocrit: 35.3 % (ref 34.0–46.6)
Hemoglobin: 11.5 g/dL (ref 11.1–15.9)
MCH: 28.2 pg (ref 26.6–33.0)
MCHC: 32.6 g/dL (ref 31.5–35.7)
MCV: 87 fL (ref 79–97)
Platelets: 238 10*3/uL (ref 150–450)
RBC: 4.08 x10E6/uL (ref 3.77–5.28)
RDW: 17 % — ABNORMAL HIGH (ref 11.7–15.4)
WBC: 4.6 10*3/uL (ref 3.4–10.8)

## 2020-05-11 NOTE — Progress Notes (Signed)
Electrophysiology Office Note   Date:  05/11/2020   ID:  KEVIN MARIO, DOB [birth year], MRN [id number]  PCP:  Marton Redwood, MD  Cardiologist:  Ellyn Hack Primary Electrophysiologist:  Lamario Mani Meredith Leeds, MD    Chief Complaint: CHF   History of Present Illness: Elaine [name] is a 79 y.o. female who is being seen today for the evaluation of CHF at the request of Marton Redwood, MD. Presenting today for electrophysiology evaluation.  She has a history of CHF status post CABG with left atrial appendage clipping, ischemic cardiomyopathy, persistent atrial fibrillation, hypertension, hyperlipidemia, type 2 diabetes, hypothyroidism, and stage III CKD.  She was hospitalized 06/12/2019 with decompensated heart failure.  Echo showed an ejection fraction less than 20%.  She was also in atrial fibrillation with RVR.  It was initially thought that her heart failure is due to her rapid atrial fibrillation and her left bundle branch block.  She is now status post [place] CRT D implanted 07/28/2019.  Today, denies symptoms of palpitations, chest pain,  orthopnea, PND, lower extremity edema, claudication, dizziness, presyncope, syncope, bleeding, or neurologic sequela. The patient is tolerating medications without difficulties. Her main complaint today is shortness of breath. She also has weakness and fatigue. Device interrogation shows that her BiV pacing is decreased down to 75%. It appears that this is due to her rapid atrial fibrillation. She says that she cannot walk across the room. She is in a wheelchair today. She is upset as she has not felt good enough to go to church on a regular basis.  Past Medical History:  Diagnosis Date  . Acute myocardial infarction of anterior wall (HCC) 10/20/2013   Afib, LBBB  . Acute pulmonary edema with congestive heart failure ([place]) 10/20/2013   Resolved  . Arthritis   . Atrial fibrillation ([place]) 10/20/2013   On Warfarin  . CAD, multiple vessel 10/20/2013   LM, LAD  & RCA --> CABG; MYOVIEW 12/'15: Intermediate Risk would large anterior, anteroapical segment the apex possible infarct and peri-infarct ischemia  . CHF (congestive heart failure) (HCC)    EF 40-45%.  . Chronic kidney disease   . COPD (chronic obstructive pulmonary disease) ([place])   . Diabetic neuropathy ([place])   . Heart murmur    Likely related to aortic sclerosis  . Hyperlipidemia   . Hypertension   . Hypothyroidism   . Ischemic cardiomyopathy - Notable Improvement in EF post CABG 10/20/2013   Echo 2/23: EF 20-25%; mild LVH. anteroseptal Akinesis; mid-apical anterior, inferior & inferoseptal + apical-lateral akinesis; G 1 DDysfxn.; Mild-Mod LA dil; mild MR; Mod PHTN;; F/u Echo 12/2013: EF 40-45%, septal & apical HK, mild LVH; Mod LA dilation  . Left bundle branch block (LBBB) on electrocardiogram   . Mild mitral regurgitation by prior echocardiogram    Mild-Mod MR on Echo  . Morbid obesity ([place])   . OSTEOARTHRITIS 08/06/2006  . S/P CABG x 3 10/23/2013   LIMA to LAD, SVG to OM2, SVG to RPLB, EVH via right thigh  . Type II diabetes mellitus with complication ([place]) 09/38/1829   off rx since heart attack 12/15-dr told could stay off if keep cbg under 150  . UTI (lower urinary tract infection) 09/29/14   ongoing now. started rx 09/28/14   Past Surgical History:  Procedure Laterality Date  . ABDOMINAL HYSTERECTOMY    . BIV ICD INSERTION CRT-D N/A 07/28/2019   Procedure: BIV ICD INSERTION CRT-D;  Surgeon: Constance Haw, MD;  Location: [facility];  Service: Cardiovascular;  Laterality: N/A;  . BREAST LUMPECTOMY W/ NEEDLE LOCALIZATION Right 10/06/2014   Dr Georgette Dover  . BREAST LUMPECTOMY WITH NEEDLE LOCALIZATION Right 10/06/2014   Procedure: RIGHT BREAST LUMPECTOMY WITH NEEDLE LOCALIZATION;  Surgeon: Donnie Mesa, MD;  Location: [facility];  Service: General;  Laterality: Right;  . CARDIOVERSION N/A 10/22/2019   Procedure: CARDIOVERSION;  Surgeon: Larey Dresser, MD;  Location: [facility] ENDOSCOPY;   Service: Cardiovascular;  Laterality: N/A;  . CLIPPING OF ATRIAL APPENDAGE N/A 10/23/2013   Procedure: CLIPPING OF ATRIAL APPENDAGE;  Surgeon: Rexene Alberts, MD;  Location: [facility];  Service: Open Heart Surgery;  Laterality: N/A;  . 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  . CORONARY ARTERY BYPASS GRAFT N/A 10/23/2013   Procedure: CORONARY ARTERY BYPASS GRAFTING (CABG);  Surgeon: Rexene Alberts, MD;  Location: [place] [facility];  Service: Open Heart Surgery: LIMA-LAD, SVG-OM2, SVG-RPL  . INTRAOPERATIVE TRANSESOPHAGEAL ECHOCARDIOGRAM N/A 10/23/2013   Procedure: INTRAOPERATIVE TRANSESOPHAGEAL ECHOCARDIOGRAM;  Surgeon: Rexene Alberts, MD;  Location: [facility] [place];  Service: Open Heart Surgery;  Laterality: N/A;  . LEFT HEART CATHETERIZATION WITH CORONARY ANGIOGRAM N/A 10/20/2013   Procedure: LEFT HEART CATHETERIZATION WITH CORONARY ANGIOGRAM;  Surgeon: Leonie Man, MD;  Location: [facility] CATH LAB;  Service: Cardiovascular: Distal LM ~70%, LAD - ostial 70%, prox 80, 95 & 95%; RCA prox 70%, mid 80%; minimal Cx disease  . LEFT HEART CATHETERIZATION WITH CORONARY/GRAFT ANGIOGRAM N/A 09/08/2014   Procedure: LEFT HEART CATHETERIZATION WITH Beatrix Fetters;  Surgeon: Leonie Man, MD;  Location: [facility] CATH LAB: Indication: Abnormal Myoview. Occluded LAD after 70 and 80% left main. Diffuse RCA disease. Widely patent grafts to moderately diseased artery vessels. Mild-moderate LV dysfunction and chronic A. fib.  [name] NM MYOVIEW LTD  08/14/2014   [place] scan: INTERMEDIATE RISK - large, severe intensity perfusion defect in anterior wall, apex and inferior apex that is partly reversible. This suggests either scar or possible hibernating myocardium. Nondeviated.-> Likely scar. No change in coronary anatomy with patent grafts.  [name] TOTAL HIP ARTHROPLASTY  2010, 2011   right 2010, left 2011  . TRANSTHORACIC ECHOCARDIOGRAM  12/2013   Mildly dilated left ventricle with mild to moderately reduced function. EF 40-45% (notable improvement from  February 2015). Septal and apical hypokinesis. Mild LA dilation.     Current Outpatient Medications  Medication Sig Dispense Refill  . acetaminophen (TYLENOL) 650 MG CR tablet Take 1,300 mg by mouth 2 (two) times daily.    [name] albuterol (PROAIR HFA) 108 (90 Base) MCG/ACT inhaler Inhale 2 puffs into the lungs every 6 (six) hours as needed for wheezing or shortness of breath.    [name] apixaban (ELIQUIS) 5 MG TABS tablet Take 1 tablet (5 mg total) by mouth 2 (two) times daily. Resume Saturday evening. 60 tablet   . Ascorbic Acid (VITAMIN C) 1000 MG tablet Take 2,000 mg by mouth daily.    . AZO-CRANBERRY PO Take 1 tablet by mouth 2 (two) times daily.     . carboxymethylcellulose (REFRESH PLUS) 0.5 % SOLN Place 1 drop into the left eye daily.    . Coenzyme Q10 (COQ-10) 100 MG CAPS Take 100 mg by mouth daily.    . colchicine 0.6 MG tablet Take 0.6 mg by mouth as needed.     . Dulaglutide (TRULICITY) 3.70 WU/8.8BV SOPN Inject 0.75 mg into the skin every Sunday at 6pm.     . ezetimibe (ZETIA) 10 MG tablet Take 10 mg by mouth daily.    [name]  ferrous sulfate 325 (65 FE) MG tablet Take 325 mg by mouth every other day.    . fluticasone (CUTIVATE) 0.05 % cream APPLY TO AFFECTED AREAS (FACE AND EARS) UP TO TWICE A DAY OR AS NEEDED    . isosorbide-hydrALAZINE (BIDIL) 20-37.5 MG tablet Take 1 tablet by mouth 3 (three) times daily. 90 tablet 5  . levothyroxine (SYNTHROID) 112 MCG tablet Take 1 tablet (112 mcg total) by mouth daily before breakfast. 30 tablet 12  . Misc Natural Products (JOINT HEALTH PO) Take 30 mLs by mouth daily.    . Multiple Vitamin (MULTIVITAMIN WITH MINERALS) TABS tablet Take 1 tablet by mouth daily.    . Multiple Vitamins-Minerals (PRESERVISION AREDS 2) CAPS Take 1 capsule by mouth 2 (two) times daily.    . potassium chloride SA (KLOR-CON) 20 MEQ tablet Take 1 tablet (20 mEq total) by mouth 2 (two) times daily. 30 tablet 11  . rosuvastatin (CRESTOR) 5 MG tablet Take 5 mg by mouth See admin  instructions. Take one tablet (5 mg) by mouth at night on Sunday, Tuesday, Thursday, Saturday    . torsemide (DEMADEX) 20 MG tablet Take 4 tablets (80 mg total) by mouth 2 (two) times daily. 240 tablet 3  . triamcinolone cream (KENALOG) 0.1 % APPLY TO THE AFFECTED AREA UP TO TWICE DAILY OR AS NEEDED (NO TO FACE GROIN AXILLA)    . umeclidinium-vilanterol (ANORO ELLIPTA) 62.5-25 MCG/INH AEPB Inhale 1 puff into the lungs daily.     No current facility-administered medications for this visit.    Allergies:   Crestor [rosuvastatin calcium], Allopurinol, and Tape   Social History:  The patient  reports that she quit smoking about 26 years ago. Her smoking use included cigarettes. She has a 19.50 pack-year smoking history. She has never used smokeless tobacco. She reports that she does not drink alcohol and does not use drugs.   Family History:  The patient's family history includes Hypertension in her mother; Other in her mother. She was adopted.   ROS:  Please see the history of present illness.   Otherwise, review of systems is positive for none.   All other systems are reviewed and negative.   PHYSICAL EXAM: VS:  BP 96/64   Pulse 88   Ht 5\' 2"  (1.575 m)   Wt 218 lb (98.9 kg)   SpO2 95%   BMI 39.87 kg/m  , BMI Body mass index is 39.87 kg/m. GEN: Well nourished, well developed, in no acute distress  HEENT: normal  Neck: no JVD, carotid bruits, or masses Cardiac: Irregular; no murmurs, rubs, or gallops,no edema  Respiratory:  clear to auscultation bilaterally, normal work of breathing GI: soft, nontender, nondistended, + BS MS: no deformity or atrophy  Skin: warm and dry, device site well healed Neuro:  Strength and sensation are intact Psych: euthymic mood, full affect  EKG:  EKG is not ordered today. Personal review of the ekg ordered 04/22/2020 shows atrial fibrillation, intermittent ventricular paced  Personal review of the device interrogation today. Results in [place]: 09/26/2019: B Natriuretic Peptide 371.4 01/06/2020: Hemoglobin 12.0; Platelets 194 03/09/2020: ALT 34; Magnesium 2.1; TSH 1.780 05/04/2020: BUN 38; Creatinine, Ser 2.36; Potassium 4.5; Sodium 140    Lipid Panel     Component Value Date/Time   CHOL 123 04/22/2020 1045   TRIG 60 04/22/2020 1045   HDL 65 04/22/2020 1045   CHOLHDL 1.9 04/22/2020 1045   VLDL 12 04/22/2020 1045   LDLCALC 46 04/22/2020  1045     Wt Readings from Last 3 Encounters:  05/11/20 218 lb (98.9 kg)  04/22/20 227 lb (103 kg)  03/09/20 228 lb 4.8 oz (103.6 kg)      Other studies Reviewed: Additional studies/ records that were reviewed today include: TTE 06/13/19  Review of the above records today demonstrates:   1. Left ventricular ejection fraction, by visual estimation, is <20%. The left ventricle has severely decreased function. There is mildly increased left ventricular hypertrophy.  2. Left ventricular diastolic function could not be evaluated pattern of LV diastolic filling.  3. Global right ventricle has normal systolic function.The right ventricular size is normal. No increase in right ventricular wall thickness.  4. Left atrial size was normal.  5. Right atrial size was normal.  6. Moderate calcification of the mitral valve leaflet(s).  7. Moderate thickening of the mitral valve leaflet(s).  8. The mitral valve is abnormal. Trace mitral valve regurgitation.  9. The tricuspid valve is normal in structure. Tricuspid valve regurgitation is mild. 10. The aortic valve is tricuspid Aortic valve regurgitation is trivial by color flow Doppler. Mild to moderate aortic valve sclerosis/calcification without any evidence of aortic stenosis. 11. The pulmonic valve was normal in structure. Pulmonic valve regurgitation is trivial by color flow Doppler. 12. Moderately elevated pulmonary artery systolic pressure. 13. The inferior vena cava is normal in size with greater than 50% respiratory variability, suggesting  right atrial pressure of 3 mmHg. 14. The atrial septum is grossly normal.   ASSESSMENT AND PLAN:  1.  Chronic systolic heart failure due to ischemic cardiomyopathy: Currently on Lasix, Toprol-XL, and losartan.  Has not tolerated higher doses of these medications.  She is status post [place] CRT-D implanted 07/28/2019.  She is having decreased BiV pacing and we Joshlyn Beadle plan for AV node ablation.    2.  Permanent atrial fibrillation: Currently on Eliquis.  CHA2DS2-VASc of 6.  Biventricular pacing has significantly gone down and she has complaints of weakness and fatigue.  She would likely benefit from AV node ablation.  She does have a CRT-D implanted.  Risks and benefits were discussed which were bleeding, tamponade, stroke.  The patient understands these risks and has agreed to the procedure.    3.  Coronary artery disease status post CABG: No current chest pain.  Continue current management.  Case discussed with primary cardiology  Current medicines are reviewed at length with the patient today.   The patient does not have concerns regarding her medicines.  The following changes were made today: None  Labs/ tests ordered today include:  Orders Placed This Encounter  Procedures  . CBC     Disposition:   FU with Iliana Hutt three months  Signed, Santana Gosdin Meredith Leeds, MD  05/11/2020 10:56 AM     [facility] [place] [address] [phone] (office) [phone] (fax)

## 2020-05-11 NOTE — Patient Instructions (Addendum)
Medication Instructions:  Your physician recommends that you continue on your current medications as directed. Please refer to the Current Medication list given to you today.  *If you need a refill on your cardiac medications before your next appointment, please call your pharmacy*   Lab Work: Pre procedure lab today: CBC If you have labs (blood work) drawn today and your tests are completely normal, you will receive your results only by: [name] MyChart Message (if you have MyChart) OR . A paper copy in the mail If you have any lab test that is abnormal or we need to change your treatment, we will call you to review the results.   Testing/Procedures: Your physician has recommended that you have an ablation. Catheter ablation is a medical procedure used to treat some cardiac arrhythmias (irregular heartbeats). During catheter ablation, a long, thin, flexible tube is put into a blood vessel in your groin (upper thigh), or neck. This tube is called an ablation catheter. It is then guided to your heart through the blood vessel. Radio frequency waves destroy small areas of heart tissue where abnormal heartbeats may cause an arrhythmia to start. Please see the instructions below.  Follow-Up: At [facility], you and your health needs are our priority.  As part of our continuing mission to provide you with exceptional heart care, we have created designated Provider Care Teams.  These Care Teams include your primary Cardiologist (physician) and Advanced Practice Providers (APPs -  Physician Assistants and Nurse Practitioners) who all work together to provide you with the care you need, when you need it.  We recommend signing up for the patient portal called "MyChart".  Sign up information is provided on this After Visit Summary.  MyChart is used to connect with patients for Virtual Visits (Telemedicine).  Patients are able to view lab/test results, encounter notes, upcoming appointments, etc.  Non-urgent  messages can be sent to your provider as well.   To learn more about what you can do with MyChart, go to [email or web address].    Your next appointment:   1 month(s)  The format for your next appointment:   In Person  Provider:   Allegra Lai, MD   Thank you for choosing [facility]!!   Trinidad Curet, RN [phone]    Other Instructions    Electrophysiology/Ablation Procedure Instructions   You are scheduled for a(n) AV Node ablation on 05/19/2020 with Dr. Allegra Lai.   1.   Pre procedure testing-             A.  LAB WORK --- On 05/11/2020  for your pre procedure blood work.                 B. COVID TEST-- On 05/17/2020 @ 10:30 am - This is a Drive Up Visit at [address] [place], [place] [address].  Someone will direct you to the appropriate testing line. Stay in your car and someone will be with you shortly.   After you are tested please go home and self quarantine until the day of your procedure.     2. On the day of your procedure 05/19/2020 you will go to [facility] [phone]) at 9:30 am.  Dennis Bast will go to the main entrance [address] [employer]) and enter where the [employer] are.  Your driver will drop you off and you will head down the hallway to ADMITTING.  You may have one support person come in to the  hospital with you.  They will be asked to wait in the waiting room.   3.   Do not eat or drink after midnight prior to your procedure.   4.   Do NOT take any medications the morning of your procedure.   5.  Plan for an overnight stay.  If you use your phone frequently bring your phone charger.   6. You will follow up with Dr. Curt Bears 1 month after your procedure.  This appointment will be made for you.   * If you have ANY questions please call the office [phone] and ask for Kaveri Perras RN or send me a MyChart message   * Occasionally, EP Studies and ablations can become lengthy.  Please make your family aware of this before your  procedure starts.  Average time ranges from 2-8 hours for EP studies/ablations.  Your physician will call your family after the procedure with the results.     Cardiac Ablation Cardiac ablation is a procedure to disable (ablate) a small amount of heart tissue in very specific places. The heart has many electrical connections. Sometimes these connections are abnormal and can cause the heart to beat very fast or irregularly. Ablating some of the problem areas can improve the heart rhythm or return it to normal. Ablation may be done for people who:  Have Wolff-Parkinson-White syndrome.  Have fast heart rhythms (tachycardia).  Have taken medicines for an abnormal heart rhythm (arrhythmia) that were not effective or caused side effects.  Have a high-risk heartbeat that may be life-threatening. During the procedure, a small incision is made in the neck or the groin, and a long, thin, flexible tube (catheter) is inserted into the incision and moved to the heart. Small devices (electrodes) on the tip of the catheter will send out electrical currents. A type of X-ray (fluoroscopy) will be used to help guide the catheter and to provide images of the heart. Tell a health care provider about:  Any allergies you have.  All medicines you are taking, including vitamins, herbs, eye drops, creams, and over-the-counter medicines.  Any problems you or family members have had with anesthetic medicines.  Any blood disorders you have.  Any surgeries you have had.  Any medical conditions you have, such as kidney failure.  Whether you are pregnant or may be pregnant. What are the risks? Generally, this is a safe procedure. However, problems may occur, including:  Infection.  Bruising and bleeding at the catheter insertion site.  Bleeding into the chest, especially into the sac that surrounds the heart. This is a serious complication.  Stroke or blood clots.  Damage to other structures or  organs.  Allergic reaction to medicines or dyes.  Need for a permanent pacemaker if the normal electrical system is damaged. A pacemaker is a small computer that sends electrical signals to the heart and helps your heart beat normally.  The procedure not being fully effective. This may not be recognized until months later. Repeat ablation procedures are sometimes required. What happens before the procedure?  Follow instructions from your health care provider about eating or drinking restrictions.  Ask your health care provider about: ? Changing or stopping your regular medicines. This is especially important if you are taking diabetes medicines or blood thinners. ? Taking medicines such as aspirin and ibuprofen. These medicines can thin your blood. Do not take these medicines before your procedure if your health care provider instructs you not to.  Plan to have someone take you  home from the hospital or clinic.  If you will be going home right after the procedure, plan to have someone with you for 24 hours. What happens during the procedure?  To lower your risk of infection: ? Your health care team will wash or sanitize their hands. ? Your skin will be washed with soap. ? Hair may be removed from the incision area.  An IV tube will be inserted into one of your veins.  You will be given a medicine to help you relax (sedative).  The skin on your neck or groin will be numbed.  An incision will be made in your neck or your groin.  A needle will be inserted through the incision and into a large vein in your neck or groin.  A catheter will be inserted into the needle and moved to your heart.  Dye may be injected through the catheter to help your surgeon see the area of the heart that needs treatment.  Electrical currents will be sent from the catheter to ablate heart tissue in desired areas. There are three types of energy that may be used to ablate heart tissue: ? Heat  (radiofrequency energy). ? Laser energy. ? Extreme cold (cryoablation).  When the necessary tissue has been ablated, the catheter will be removed.  Pressure will be held on the catheter insertion area to prevent excessive bleeding.  A bandage (dressing) will be placed over the catheter insertion area. The procedure may vary among health care providers and hospitals. What happens after the procedure?  Your blood pressure, heart rate, breathing rate, and blood oxygen level will be monitored until the medicines you were given have worn off.  Your catheter insertion area will be monitored for bleeding. You will need to lie still for a few hours to ensure that you do not bleed from the catheter insertion area.  Do not drive for 24 hours or as long as directed by your health care provider. Summary  Cardiac ablation is a procedure to disable (ablate) a small amount of heart tissue in very specific places. Ablating some of the problem areas can improve the heart rhythm or return it to normal.  During the procedure, electrical currents will be sent from the catheter to ablate heart tissue in desired areas. This information is not intended to replace advice given to you by your health care provider. Make sure you discuss any questions you have with your health care provider. Document Revised: 02/04/2018 Document Reviewed: 07/03/2016 Elsevier Patient Education  [place].

## 2020-05-11 NOTE — H&P (View-Only) (Signed)
Electrophysiology Office Note   Date:  05/11/2020   ID:  Elaine [name], DOB 1941-07-16, MRN [id number]  PCP:  Marton Redwood, MD  Cardiologist:  Ellyn Hack Primary Electrophysiologist:  Mearl Olver Meredith Leeds, MD    Chief Complaint: CHF   History of Present Illness: Elaine [name] is a 79 y.o. female who is being seen today for the evaluation of CHF at the request of Marton Redwood, MD. Presenting today for electrophysiology evaluation.  She has a history of CHF status post CABG with left atrial appendage clipping, ischemic cardiomyopathy, persistent atrial fibrillation, hypertension, hyperlipidemia, type 2 diabetes, hypothyroidism, and stage III CKD.  She was hospitalized 06/12/2019 with decompensated heart failure.  Echo showed an ejection fraction less than 20%.  She was also in atrial fibrillation with RVR.  It was initially thought that her heart failure is due to her rapid atrial fibrillation and her left bundle branch block.  She is now status post [place] CRT D implanted 07/28/2019.  Today, denies symptoms of palpitations, chest pain,  orthopnea, PND, lower extremity edema, claudication, dizziness, presyncope, syncope, bleeding, or neurologic sequela. The patient is tolerating medications without difficulties. Her main complaint today is shortness of breath. She also has weakness and fatigue. Device interrogation shows that her BiV pacing is decreased down to 75%. It appears that this is due to her rapid atrial fibrillation. She says that she cannot walk across the room. She is in a wheelchair today. She is upset as she has not felt good enough to go to church on a regular basis.  Past Medical History:  Diagnosis Date  . Acute myocardial infarction of anterior wall (HCC) 10/20/2013   Afib, LBBB  . Acute pulmonary edema with congestive heart failure ([place]) 10/20/2013   Resolved  . Arthritis   . Atrial fibrillation ([place]) 10/20/2013   On Warfarin  . CAD, multiple vessel 10/20/2013   LM, LAD  & RCA --> CABG; MYOVIEW 12/'15: Intermediate Risk would large anterior, anteroapical segment the apex possible infarct and peri-infarct ischemia  . CHF (congestive heart failure) (HCC)    EF 40-45%.  . Chronic kidney disease   . COPD (chronic obstructive pulmonary disease) ([place])   . Diabetic neuropathy ([place])   . Heart murmur    Likely related to aortic sclerosis  . Hyperlipidemia   . Hypertension   . Hypothyroidism   . Ischemic cardiomyopathy - Notable Improvement in EF post CABG 10/20/2013   Echo 2/23: EF 20-25%; mild LVH. anteroseptal Akinesis; mid-apical anterior, inferior & inferoseptal + apical-lateral akinesis; G 1 DDysfxn.; Mild-Mod LA dil; mild MR; Mod PHTN;; F/u Echo 12/2013: EF 40-45%, septal & apical HK, mild LVH; Mod LA dilation  . Left bundle branch block (LBBB) on electrocardiogram   . Mild mitral regurgitation by prior echocardiogram    Mild-Mod MR on Echo  . Morbid obesity ([place])   . OSTEOARTHRITIS 08/06/2006  . S/P CABG x 3 10/23/2013   LIMA to LAD, SVG to OM2, SVG to RPLB, EVH via right thigh  . Type II diabetes mellitus with complication ([place]) 29/92/4268   off rx since heart attack 12/15-dr told could stay off if keep cbg under 150  . UTI (lower urinary tract infection) 09/29/14   ongoing now. started rx 09/28/14   Past Surgical History:  Procedure Laterality Date  . ABDOMINAL HYSTERECTOMY    . BIV ICD INSERTION CRT-D N/A 07/28/2019   Procedure: BIV ICD INSERTION CRT-D;  Surgeon: Constance Haw, MD;  Location: [facility];  Service: Cardiovascular;  Laterality: N/A;  . BREAST LUMPECTOMY W/ NEEDLE LOCALIZATION Right 10/06/2014   Dr Georgette Dover  . BREAST LUMPECTOMY WITH NEEDLE LOCALIZATION Right 10/06/2014   Procedure: RIGHT BREAST LUMPECTOMY WITH NEEDLE LOCALIZATION;  Surgeon: Donnie Mesa, MD;  Location: [facility];  Service: General;  Laterality: Right;  . CARDIOVERSION N/A 10/22/2019   Procedure: CARDIOVERSION;  Surgeon: Larey Dresser, MD;  Location[facility] ENDOSCOPY;   Service: Cardiovascular;  Laterality: N/A;  . CLIPPING OF ATRIAL APPENDAGE N/A 10/23/2013   Procedure: CLIPPING OF ATRIAL APPENDAGE;  Surgeon: Rexene Alberts, MD;  Location: [facility];  Service: Open Heart Surgery;  Laterality: N/A;  . 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  . CORONARY ARTERY BYPASS GRAFT N/A 10/23/2013   Procedure: CORONARY ARTERY BYPASS GRAFTING (CABG);  Surgeon: Rexene Alberts, MD;  Location: [facility];  Service: Open Heart Surgery: LIMA-LAD, SVG-OM2, SVG-RPL  . INTRAOPERATIVE TRANSESOPHAGEAL ECHOCARDIOGRAM N/A 10/23/2013   Procedure: INTRAOPERATIVE TRANSESOPHAGEAL ECHOCARDIOGRAM;  Surgeon: Rexene Alberts, MD;  Location: [facility];  Service: Open Heart Surgery;  Laterality: N/A;  . LEFT HEART CATHETERIZATION WITH CORONARY ANGIOGRAM N/A 10/20/2013   Procedure: LEFT HEART CATHETERIZATION WITH CORONARY ANGIOGRAM;  Surgeon: Leonie Man, MD;  Location[facility] C[facility] LAB;  Service: Cardiovascular: Distal LM ~70%, LAD - ostial 70%, prox 80, 95 & 95%; RCA prox 70%, mid 80%; minimal Cx disease  . LEFT HEART CATHETERIZATION WITH CORONARY/GRAFT ANGIOGRAM N/A 09/08/2014   Procedure: LEFT HEART CATHETERIZATION WITH Beatrix Fetters;  Surgeon: Leonie Man, MD;  Location: [facility] CATH LAB: Indication: Abnormal Myoview. Occluded LAD after 70 and 80% left main. Diffuse RCA disease. Widely patent grafts to moderately diseased artery vessels. Mild-moderate LV dysfunction and chronic A. fib.  [name] NM MYOVIEW LTD  08/14/2014   [place]i scan: INTERMEDIATE RISK - large, severe intensity perfusion defect in anterior wall, apex and inferior apex that is partly reversible. This suggests either scar or possible hibernating myocardium. Nondeviated.-> Likely scar. No change in coronary anatomy with patent grafts.  [name] TOTAL HIP ARTHROPLASTY  2010, 2011   right 2010, left 2011  . TRANSTHORACIC ECHOCARDIOGRAM  12/2013   Mildly dilated left ventricle with mild to moderately reduced function. EF 40-45% (notable improvement from  February 2015). Septal and apical hypokinesis. Mild LA dilation.     Current Outpatient Medications  Medication Sig Dispense Refill  . acetaminophen (TYLENOL) 650 MG CR tablet Take 1,300 mg by mouth 2 (two) times daily.    [name] albuterol (PROAIR HFA) 108 (90 Base) MCG/ACT inhaler Inhale 2 puffs into the lungs every 6 (six) hours as needed for wheezing or shortness of breath.    [name] apixaban (ELIQUIS) 5 MG TABS tablet Take 1 tablet (5 mg total) by mouth 2 (two) times daily. Resume Saturday evening. 60 tablet   . Ascorbic Acid (VITAMIN C) 1000 MG tablet Take 2,000 mg by mouth daily.    . AZO-CRANBERRY PO Take 1 tablet by mouth 2 (two) times daily.     . carboxymethylcellulose (REFRESH PLUS) 0.5 % SOLN Place 1 drop into the left eye daily.    . Coenzyme Q10 (COQ-10) 100 MG CAPS Take 100 mg by mouth daily.    . colchicine 0.6 MG tablet Take 0.6 mg by mouth as needed.     . Dulaglutide (TRULICITY) 8.67 EH/2.0NO SOPN Inject 0.75 mg into the skin every Sunday at 6pm.     . ezetimibe (ZETIA) 10 MG tablet Take 10 mg by mouth daily.    [name]  ferrous sulfate 325 (65 FE) MG tablet Take 325 mg by mouth every other day.    . fluticasone (CUTIVATE) 0.05 % cream APPLY TO AFFECTED AREAS (FACE AND EARS) UP TO TWICE A DAY OR AS NEEDED    . isosorbide-hydrALAZINE (BIDIL) 20-37.5 MG tablet Take 1 tablet by mouth 3 (three) times daily. 90 tablet 5  . levothyroxine (SYNTHROID) 112 MCG tablet Take 1 tablet (112 mcg total) by mouth daily before breakfast. 30 tablet 12  . Misc Natural Products (JOINT HEALTH PO) Take 30 mLs by mouth daily.    . Multiple Vitamin (MULTIVITAMIN WITH MINERALS) TABS tablet Take 1 tablet by mouth daily.    . Multiple Vitamins-Minerals (PRESERVISION AREDS 2) CAPS Take 1 capsule by mouth 2 (two) times daily.    . potassium chloride SA (KLOR-CON) 20 MEQ tablet Take 1 tablet (20 mEq total) by mouth 2 (two) times daily. 30 tablet 11  . rosuvastatin (CRESTOR) 5 MG tablet Take 5 mg by mouth See admin  instructions. Take one tablet (5 mg) by mouth at night on Sunday, Tuesday, Thursday, Saturday    . torsemide (DEMADEX) 20 MG tablet Take 4 tablets (80 mg total) by mouth 2 (two) times daily. 240 tablet 3  . triamcinolone cream (KENALOG) 0.1 % APPLY TO THE AFFECTED AREA UP TO TWICE DAILY OR AS NEEDED (NO TO FACE GROIN AXILLA)    . umeclidinium-vilanterol (ANORO ELLIPTA) 62.5-25 MCG/INH AEPB Inhale 1 puff into the lungs daily.     No current facility-administered medications for this visit.    Allergies:   Crestor [rosuvastatin calcium], Allopurinol, and Tape   Social History:  The patient  reports that she quit smoking about 26 years ago. Her smoking use included cigarettes. She has a 19.50 pack-year smoking history. She has never used smokeless tobacco. She reports that she does not drink alcohol and does not use drugs.   Family History:  The patient's family history includes Hypertension in her mother; Other in her mother. She was adopted.   ROS:  Please see the history of present illness.   Otherwise, review of systems is positive for none.   All other systems are reviewed and negative.   PHYSICAL EXAM: VS:  BP 96/64   Pulse 88   Ht 5\' 2"  (1.575 m)   Wt 218 lb (98.9 kg)   SpO2 95%   BMI 39.87 kg/m  , BMI Body mass index is 39.87 kg/m. GEN: Well nourished, well developed, in no acute distress  HEENT: normal  Neck: no JVD, carotid bruits, or masses Cardiac: Irregular; no murmurs, rubs, or gallops,no edema  Respiratory:  clear to auscultation bilaterally, normal work of breathing GI: soft, nontender, nondistended, + BS MS: no deformity or atrophy  Skin: warm and dry, device site well healed Neuro:  Strength and sensation are intact Psych: euthymic mood, full affect  EKG:  EKG is not ordered today. Personal review of the ekg ordered 04/22/2020 shows atrial fibrillation, intermittent ventricular paced  Personal review of the device interrogation today. Results in [place]: 09/26/2019: B Natriuretic Peptide 371.4 01/06/2020: Hemoglobin 12.0; Platelets 194 03/09/2020: ALT 34; Magnesium 2.1; TSH 1.780 05/04/2020: BUN 38; Creatinine, Ser 2.36; Potassium 4.5; Sodium 140    Lipid Panel     Component Value Date/Time   CHOL 123 04/22/2020 1045   TRIG 60 04/22/2020 1045   HDL 65 04/22/2020 1045   CHOLHDL 1.9 04/22/2020 1045   VLDL 12 04/22/2020 1045   LDLCALC 46 04/22/2020  1045     Wt Readings from Last 3 Encounters:  05/11/20 218 lb (98.9 kg)  04/22/20 227 lb (103 kg)  03/09/20 228 lb 4.8 oz (103.6 kg)      Other studies Reviewed: Additional studies/ records that were reviewed today include: TTE 06/13/19  Review of the above records today demonstrates:   1. Left ventricular ejection fraction, by visual estimation, is <20%. The left ventricle has severely decreased function. There is mildly increased left ventricular hypertrophy.  2. Left ventricular diastolic function could not be evaluated pattern of LV diastolic filling.  3. Global right ventricle has normal systolic function.The right ventricular size is normal. No increase in right ventricular wall thickness.  4. Left atrial size was normal.  5. Right atrial size was normal.  6. Moderate calcification of the mitral valve leaflet(s).  7. Moderate thickening of the mitral valve leaflet(s).  8. The mitral valve is abnormal. Trace mitral valve regurgitation.  9. The tricuspid valve is normal in structure. Tricuspid valve regurgitation is mild. 10. The aortic valve is tricuspid Aortic valve regurgitation is trivial by color flow Doppler. Mild to moderate aortic valve sclerosis/calcification without any evidence of aortic stenosis. 11. The pulmonic valve was normal in structure. Pulmonic valve regurgitation is trivial by color flow Doppler. 12. Moderately elevated pulmonary artery systolic pressure. 13. The inferior vena cava is normal in size with greater than 50% respiratory variability, suggesting  right atrial pressure of 3 mmHg. 14. The atrial septum is grossly normal.   ASSESSMENT AND PLAN:  1.  Chronic systolic heart failure due to ischemic cardiomyopathy: Currently on Lasix, Toprol-XL, and losartan.  Has not tolerated higher doses of these medications.  She is status post [place] CRT-D implanted 07/28/2019.  She is having decreased BiV pacing and we Cormick Moss plan for AV node ablation.    2.  Permanent atrial fibrillation: Currently on Eliquis.  CHA2DS2-VASc of 6.  Biventricular pacing has significantly gone down and she has complaints of weakness and fatigue.  She would likely benefit from AV node ablation.  She does have a CRT-D implanted.  Risks and benefits were discussed which were bleeding, tamponade, stroke.  The patient understands these risks and has agreed to the procedure.    3.  Coronary artery disease status post CABG: No current chest pain.  Continue current management.  Case discussed with primary cardiology  Current medicines are reviewed at length with the patient today.   The patient does not have concerns regarding her medicines.  The following changes were made today: None  Labs/ tests ordered today include:  Orders Placed This Encounter  Procedures  . CBC     Disposition:   FU with Juliana Boling three months  Signed, Garwood Wentzell Meredith Leeds, MD  05/11/2020 10:56 AM     [facility] [address] [place] [address] [phone] (office) [phone] (fax)

## 2020-05-17 ENCOUNTER — Other Ambulatory Visit (HOSPITAL_COMMUNITY)
Admission: RE | Admit: 2020-05-17 | Discharge: 2020-05-17 | Disposition: A | Discharge status: Home / Self Care | Payer: Medicare Other | Source: Ambulatory Visit | Attending: Cardiology | Admitting: Cardiology

## 2020-05-17 ENCOUNTER — Telehealth (HOSPITAL_COMMUNITY): Payer: Self-pay | Admitting: Vascular Surgery

## 2020-05-17 DIAGNOSIS — Z01812 Encounter for preprocedural laboratory examination: Secondary | ICD-10-CM | POA: Diagnosis present

## 2020-05-17 DIAGNOSIS — Z20822 Contact with and (suspected) exposure to covid-19: Secondary | ICD-10-CM | POA: Insufficient documentation

## 2020-05-17 LAB — SARS CORONAVIRUS 2 (TAT 6-24 HRS): SARS Coronavirus 2: NEGATIVE

## 2020-05-17 NOTE — Telephone Encounter (Signed)
Returned pt call to reschedule appt 

## 2020-05-17 NOTE — Telephone Encounter (Signed)
Left pt message to confirm if pt is coming to 9/23 appt, due to pt getting ablation

## 2020-05-18 NOTE — Progress Notes (Signed)
Attempted to call patient regarding procedure instructions for tomorrow.  No answer. 

## 2020-05-19 ENCOUNTER — Encounter (HOSPITAL_COMMUNITY)
Admission: RE | Disposition: A | Discharge status: Home / Self Care | Payer: Self-pay | Source: Home / Self Care | Attending: Cardiology

## 2020-05-19 ENCOUNTER — Other Ambulatory Visit: Payer: Self-pay

## 2020-05-19 ENCOUNTER — Ambulatory Visit (HOSPITAL_COMMUNITY)
Admission: RE | Admit: 2020-05-19 | Discharge: 2020-05-19 | Disposition: A | Discharge status: Home / Self Care | Payer: Medicare Other | Attending: Cardiology | Admitting: Cardiology

## 2020-05-19 DIAGNOSIS — I272 Pulmonary hypertension, unspecified: Secondary | ICD-10-CM | POA: Insufficient documentation

## 2020-05-19 DIAGNOSIS — Z951 Presence of aortocoronary bypass graft: Secondary | ICD-10-CM | POA: Diagnosis not present

## 2020-05-19 DIAGNOSIS — I255 Ischemic cardiomyopathy: Secondary | ICD-10-CM | POA: Diagnosis not present

## 2020-05-19 DIAGNOSIS — N183 Chronic kidney disease, stage 3 unspecified: Secondary | ICD-10-CM | POA: Diagnosis not present

## 2020-05-19 DIAGNOSIS — I5022 Chronic systolic (congestive) heart failure: Secondary | ICD-10-CM | POA: Diagnosis not present

## 2020-05-19 DIAGNOSIS — M1990 Unspecified osteoarthritis, unspecified site: Secondary | ICD-10-CM | POA: Insufficient documentation

## 2020-05-19 DIAGNOSIS — I2510 Atherosclerotic heart disease of native coronary artery without angina pectoris: Secondary | ICD-10-CM | POA: Insufficient documentation

## 2020-05-19 DIAGNOSIS — Z6839 Body mass index (BMI) 39.0-39.9, adult: Secondary | ICD-10-CM | POA: Insufficient documentation

## 2020-05-19 DIAGNOSIS — I130 Hypertensive heart and chronic kidney disease with heart failure and stage 1 through stage 4 chronic kidney disease, or unspecified chronic kidney disease: Secondary | ICD-10-CM | POA: Diagnosis not present

## 2020-05-19 DIAGNOSIS — I4821 Permanent atrial fibrillation: Secondary | ICD-10-CM | POA: Insufficient documentation

## 2020-05-19 DIAGNOSIS — I4892 Unspecified atrial flutter: Secondary | ICD-10-CM | POA: Insufficient documentation

## 2020-05-19 DIAGNOSIS — I447 Left bundle-branch block, unspecified: Secondary | ICD-10-CM | POA: Diagnosis not present

## 2020-05-19 DIAGNOSIS — Z87891 Personal history of nicotine dependence: Secondary | ICD-10-CM | POA: Diagnosis not present

## 2020-05-19 DIAGNOSIS — Z888 Allergy status to other drugs, medicaments and biological substances status: Secondary | ICD-10-CM | POA: Insufficient documentation

## 2020-05-19 DIAGNOSIS — J449 Chronic obstructive pulmonary disease, unspecified: Secondary | ICD-10-CM | POA: Insufficient documentation

## 2020-05-19 DIAGNOSIS — E039 Hypothyroidism, unspecified: Secondary | ICD-10-CM | POA: Insufficient documentation

## 2020-05-19 DIAGNOSIS — Z7901 Long term (current) use of anticoagulants: Secondary | ICD-10-CM | POA: Diagnosis not present

## 2020-05-19 DIAGNOSIS — Z947 Corneal transplant status: Secondary | ICD-10-CM | POA: Diagnosis not present

## 2020-05-19 DIAGNOSIS — Z79899 Other long term (current) drug therapy: Secondary | ICD-10-CM | POA: Insufficient documentation

## 2020-05-19 DIAGNOSIS — E1122 Type 2 diabetes mellitus with diabetic chronic kidney disease: Secondary | ICD-10-CM | POA: Diagnosis not present

## 2020-05-19 DIAGNOSIS — Z90710 Acquired absence of both cervix and uterus: Secondary | ICD-10-CM | POA: Insufficient documentation

## 2020-05-19 DIAGNOSIS — E785 Hyperlipidemia, unspecified: Secondary | ICD-10-CM | POA: Insufficient documentation

## 2020-05-19 DIAGNOSIS — Z8249 Family history of ischemic heart disease and other diseases of the circulatory system: Secondary | ICD-10-CM | POA: Insufficient documentation

## 2020-05-19 DIAGNOSIS — I252 Old myocardial infarction: Secondary | ICD-10-CM | POA: Diagnosis not present

## 2020-05-19 DIAGNOSIS — Z96643 Presence of artificial hip joint, bilateral: Secondary | ICD-10-CM | POA: Insufficient documentation

## 2020-05-19 DIAGNOSIS — E1140 Type 2 diabetes mellitus with diabetic neuropathy, unspecified: Secondary | ICD-10-CM | POA: Insufficient documentation

## 2020-05-19 DIAGNOSIS — I4891 Unspecified atrial fibrillation: Secondary | ICD-10-CM | POA: Diagnosis not present

## 2020-05-19 HISTORY — PX: AV NODE ABLATION: EP1193

## 2020-05-19 LAB — GLUCOSE, CAPILLARY: Glucose-Capillary: 112 mg/dL — ABNORMAL HIGH (ref 70–99)

## 2020-05-19 SURGERY — AV NODE ABLATION
Anesthesia: LOCAL

## 2020-05-19 MED ORDER — MIDAZOLAM HCL 5 MG/5ML IJ SOLN
INTRAMUSCULAR | Status: DC | PRN
  Administered 2020-05-19 (×4): 1 mg via INTRAVENOUS

## 2020-05-19 MED ORDER — SODIUM CHLORIDE 0.9 % IV SOLN: INTRAVENOUS | Status: DC

## 2020-05-19 MED ORDER — BUPIVACAINE HCL (PF) 0.25 % IJ SOLN
INTRAMUSCULAR | Status: AC
  Filled 2020-05-19: qty 30

## 2020-05-19 MED ORDER — FENTANYL CITRATE (PF) 100 MCG/2ML IJ SOLN
INTRAMUSCULAR | Status: AC
  Filled 2020-05-19: qty 2

## 2020-05-19 MED ORDER — SODIUM CHLORIDE 0.9 % IV SOLN: 250.0000 mL | INTRAVENOUS | Status: DC | PRN

## 2020-05-19 MED ORDER — ACETAMINOPHEN 325 MG PO TABS: 650.0000 mg | ORAL_TABLET | ORAL | Status: DC | PRN

## 2020-05-19 MED ORDER — HEPARIN (PORCINE) IN NACL 1000-0.9 UT/500ML-% IV SOLN
INTRAVENOUS | Status: AC
  Filled 2020-05-19: qty 500

## 2020-05-19 MED ORDER — SODIUM CHLORIDE 0.9% FLUSH: 3.0000 mL | INTRAVENOUS | Status: DC | PRN

## 2020-05-19 MED ORDER — MIDAZOLAM HCL 5 MG/5ML IJ SOLN
INTRAMUSCULAR | Status: AC
  Filled 2020-05-19: qty 5

## 2020-05-19 MED ORDER — HEPARIN (PORCINE) IN NACL 1000-0.9 UT/500ML-% IV SOLN
INTRAVENOUS | Status: DC | PRN
  Administered 2020-05-19: 500 mL

## 2020-05-19 MED ORDER — BUPIVACAINE HCL (PF) 0.25 % IJ SOLN
INTRAMUSCULAR | Status: DC | PRN
  Administered 2020-05-19: 30 mL

## 2020-05-19 MED ORDER — FENTANYL CITRATE (PF) 100 MCG/2ML IJ SOLN
INTRAMUSCULAR | Status: DC | PRN
Start: 2020-05-19 — End: 2020-05-19
  Administered 2020-05-19 (×3): 25 ug via INTRAVENOUS

## 2020-05-19 MED ORDER — ONDANSETRON HCL 4 MG/2ML IJ SOLN: 4.0000 mg | Freq: Four times a day (QID) | INTRAMUSCULAR | Status: DC | PRN

## 2020-05-19 SURGICAL SUPPLY — 12 items
BAG SNAP BAND KOVER 36X36 (MISCELLANEOUS) ×1 IMPLANT
CATH BLAZERPRIME XP LG CV 10MM (ABLATOR) ×1 IMPLANT
CATH EZ STEER NAV 8MM D-F CUR (ABLATOR) ×1 IMPLANT
DEVICE CLOSURE PERCLS PRGLD 6F (VASCULAR PRODUCTS) IMPLANT
INTRODUCER SWARTZ SRO 8F (SHEATH) ×1 IMPLANT
PACK EP LATEX FREE (CUSTOM PROCEDURE TRAY) ×2
PACK EP LF (CUSTOM PROCEDURE TRAY) ×1 IMPLANT
PAD PRO RADIOLUCENT 2001M-C (PAD) ×2 IMPLANT
PERCLOSE PROGLIDE 6F (VASCULAR PRODUCTS) ×2
SHEATH BAYLIS SUREFLEX  M 8.5 (SHEATH) ×2
SHEATH BAYLIS SUREFLEX M 8.5 (SHEATH) IMPLANT
SHEATH PINNACLE 8F 10CM (SHEATH) ×1 IMPLANT

## 2020-05-19 NOTE — Discharge Instructions (Signed)

## 2020-05-19 NOTE — Interval H&P Note (Signed)
History and Physical Interval Note:  Elaine [name] has presented today for surgery, with the diagnosis of atrial fibrillation.  The various methods of treatment have been discussed with the patient and family. After consideration of risks, benefits and other options for treatment, the patient has consented to  Procedure(s): Catheter AV node ablation as a surgical intervention .  Risks include but not limited to bleeding, vascular damage, tamponade, heart block, stroke, damage to surrounding organs, among others. The patient's history has been reviewed, patient examined, no change in status, stable for surgery.  I have reviewed the patient's chart and labs.  Questions were answered to the patient's satisfaction.    Cordarrel Stiefel Curt Bears, MD 05/19/2020 11:55 AM

## 2020-05-20 ENCOUNTER — Encounter (HOSPITAL_COMMUNITY): Payer: Medicare Other

## 2020-05-20 ENCOUNTER — Encounter (HOSPITAL_COMMUNITY): Payer: Self-pay | Admitting: Cardiology

## 2020-05-25 NOTE — Progress Notes (Signed)
No ICM remote transmission received for 05/17/2020 and next ICM transmission scheduled for 06/07/2020.

## 2020-06-02 NOTE — Progress Notes (Signed)
PCP: Marton Redwood, MD Cardiology: Dr. Ellyn Hack HF Cardiology: Dr. Aundra Dubin EP: Dr Curt Bears  79 y.o. with history of CAD s/p CABG, COPD, CKD stage 3, and chronic systolic systolic CHF/ischemic cardiomyopathy was referred by Dr. Ellyn Hack for evaluation of CHF.  Patient was admitted in 2/15 with atrial fibrillation/RVR and chest pain.  Cath showed severe 3VD and she had CABG x 3.  Echo in 2/15 showed EF down to 20-25%. In 2018, it appears that she went back into atrial fibrillation and has remained in atrial fibrillation persistently.  DCCV was not attempted.   Most recent echo in 10/20 showed EF < 20% with normal RV.  She had a [employer] CRT-D device implanted in 11/20.  She says that she feels "90%" better with CRT.  She is followed by Dr. Hollie Salk with nephrology.   She has been in atrial fibrillation persistently, I attempted DCCV while on amiodarone but she did not convert.  Therefore, I stopped amiodarone.    Echo in 3/21 showed EF 20% with diffuse hypokinesis, mildly dilated RV with mildly decreased systolic function, PASP 49 mmHg, dilated IVC.   S/P 05/19/20 AV Node Ablation by Dr Curt Bears.   Today she returns for HF follow up.Overall feeling a little stronger. Says she has more energy and not as short of breath. Still with some leg fatigue. Tries to walk some in the house. Denies PND/Orthopnea. Appetite ok. No fever or chills. She has not been weighing at home because she has been living with her daughter. Taking all medications.   ECG (personally reviewed): atrial fibrillation, LBBB with occasional BiV pacing (personally reviewed).  Labs (2/21): K 4.5, creatinine 2.08 => 2.99, hgb 12.8 Labs (7/21): K 4, creatinine 2.3, TSH normal  [employer] Jude device interrogation: BiV pacing 98%. Impedance up.   PMH: 1. CAD: s/p CABG in 2/15 with LIMA-LAD, SVG-OM2, SVG-PLV.  2. LBBB: Chronic.  3. Atrial fibrillation: Persistent since 2018.  - LA appendage clip with CBG - S/P AV Node Ablation 05/18/20  4. H/o  aspiration PNA 5. Type 2 diabetes 6. COPD 7. CKD stage 3 8. Chronic systolic CHF: Ischemic cardiomyopathy.  She has a St Jude BiV ICD.  - Echo (2/15): EF 20-25%.  - Echo (5/15): EF 40-45% - Echo (10/20): EF <20%, mild LVH, normal RV size and systolic function.  - Echo (3/21): EF 20% with diffuse hypokinesis, mildly dilated RV with mildly decreased systolic function, PASP 49 mmHg, dilated IVC.  9. CKD: Stage 3.    Social History   Socioeconomic History  . Marital status: Widowed    Spouse name: Not on file  . Number of children: Not on file  . Years of education: Not on file  . Highest education level: Not on file  Occupational History  . Occupation: retired  Tobacco Use  . Smoking status: Former Smoker    Packs/day: 0.50    Years: 39.00    Pack years: 19.50    Types: Cigarettes    Quit date: 08/28/1993    Years since quitting: 26.7  . Smokeless tobacco: Never Used  Vaping Use  . Vaping Use: Never used  Substance and Sexual Activity  . Alcohol use: No    Comment: quit 95  . Drug use: No  . Sexual activity: Not on file  Other Topics Concern  . Not on file  Social History Narrative   Lives with husband and son in [place].  She is currently retired.   Former smoker quit in 1995.  Social Determinants of Health   Financial Resource Strain: Medium Risk  . Difficulty of Paying Living Expenses: Somewhat hard  Food Insecurity: No Food Insecurity  . Worried About Charity fundraiser in the Last Year: Never true  . Ran Out of Food in the Last Year: Never true  Transportation Needs: No Transportation Needs  . Lack of Transportation (Medical): No  . Lack of Transportation (Non-Medical): No  Physical Activity:   . Days of Exercise per Week: Not on file  . Minutes of Exercise per Session: Not on file  Stress:   . Feeling of Stress : Not on file  Social Connections:   . Frequency of Communication with Friends and Family: Not on file  . Frequency of Social Gatherings with  Friends and Family: Not on file  . Attends Religious Services: Not on file  . Active Member of Clubs or Organizations: Not on file  . Attends Archivist Meetings: Not on file  . Marital Status: Not on file  Intimate Partner Violence:   . Fear of Current or Ex-Partner: Not on file  . Emotionally Abused: Not on file  . Physically Abused: Not on file  . Sexually Abused: Not on file   Family History  Adopted: Yes  Problem Relation Age of Onset  . Other Mother   . Hypertension Mother   . Colon cancer Neg Hx   . Esophageal cancer Neg Hx   . Rectal cancer Neg Hx   . Stomach cancer Neg Hx    Current Outpatient Medications  Medication Sig Dispense Refill  . acetaminophen (TYLENOL) 650 MG CR tablet Take 1,300 mg by mouth 2 (two) times daily.    [name] albuterol (PROAIR HFA) 108 (90 Base) MCG/ACT inhaler Inhale 2 puffs into the lungs every 6 (six) hours as needed for wheezing or shortness of breath.    [name] apixaban (ELIQUIS) 5 MG TABS tablet Take 1 tablet (5 mg total) by mouth 2 (two) times daily. Resume Saturday evening. 60 tablet   . Ascorbic Acid (VITAMIN C) 1000 MG tablet Take 2,000 mg by mouth daily.    . carboxymethylcellulose (REFRESH PLUS) 0.5 % SOLN Place 1 drop into the left eye daily as needed (dry eye).     . Coenzyme Q10 (COQ-10) 100 MG CAPS Take 100 mg by mouth in the morning and at bedtime.     . colchicine 0.6 MG tablet Take 0.6 mg by mouth daily as needed (Gout).     . Dulaglutide (TRULICITY) 4.97 [place]/6.3ZC SOPN Inject 0.75 mg into the skin every Sunday at 6pm.     . ezetimibe (ZETIA) 10 MG tablet Take 10 mg by mouth daily.    . ferrous sulfate 325 (65 FE) MG tablet Take 325 mg by mouth every other day.    . fluticasone (CUTIVATE) 0.05 % cream Apply 1 application topically daily as needed (Rash on arms and skin).     . isosorbide-hydrALAZINE (BIDIL) 20-37.5 MG tablet Take 1 tablet by mouth in the morning and at bedtime.    [name] levothyroxine (SYNTHROID) 112 MCG tablet Take 1  tablet (112 mcg total) by mouth daily before breakfast. 30 tablet 12  . Misc Natural Products (JOINT HEALTH PO) Take 30 mLs by mouth daily.    . Multiple Vitamin (MULTIVITAMIN WITH MINERALS) TABS tablet Take 1 tablet by mouth daily.    . Multiple Vitamins-Minerals (PRESERVISION AREDS 2) CAPS Take 1 capsule by mouth 2 (two) times daily.    . potassium chloride  SA (KLOR-CON) 20 MEQ tablet Take 1 tablet (20 mEq total) by mouth 2 (two) times daily. 30 tablet 11  . rosuvastatin (CRESTOR) 5 MG tablet Take 5 mg by mouth every other day.     . torsemide (DEMADEX) 20 MG tablet Take 4 tablets (80 mg total) by mouth 2 (two) times daily. 240 tablet 3  . triamcinolone cream (KENALOG) 0.1 % Apply 1 application topically daily as needed (Rash).     [name] umeclidinium-vilanterol (ANORO ELLIPTA) 62.5-25 MCG/INH AEPB Inhale 1 puff into the lungs daily.     No current facility-administered medications for this encounter.   BP 108/66   Wt 102.9 kg (226 lb 12.8 oz)   BMI 41.48 kg/m   Wt Readings from Last 3 Encounters:  06/03/20 102.9 kg (226 lb 12.8 oz)  05/19/20 102.1 kg (225 lb)  05/11/20 98.9 kg (218 lb)    General:  Well appearing. No resp difficulty HEENT: normal Neck: supple. no JVD. Carotids 2+ bilat; no bruits. No lymphadenopathy or thryomegaly appreciated. Cor: PMI nondisplaced. Regular rate & rhythm. No rubs, gallops or murmurs. Lungs: clear Abdomen: soft, nontender, nondistended. No hepatosplenomegaly. No bruits or masses. Good bowel sounds. Extremities: no cyanosis, clubbing, rash, R and LLE trace edema Neuro: alert & orientedx3, cranial nerves grossly intact. moves all 4 extremities w/o difficulty. Affect pleasant   Assessment/Plan: 1. Chronic systolic CHF: Ischemic cardiomyopathy.  St Jude CRT-D device.  Echo in 3/21 showed EF 20% with diffuse hypokinesis, mildly dilated RV with mildly decreased systolic function, PASP 49 mmHg, dilated IVC.   NYHA III. Volume status stable. Continue torsemide  to 80 mg bid.   - Continue Bidil to 1 tab tid.  - Continue Toprol XL 100 mg daily.  - Off spironolactone with side effects.  - GFR < 25 so will not use dapagliflozin or Entresto/ARB/ACEI.  -S/P AV node ablation 05/19/20  2. CAD: S/p CABG in 2015.  - No chest pain.   - No ASA given apixaban use.  - Continue Crestor 5 daily + Zetia, check lipids today.  3. Atrial fibrillation: Chronic now, looks like it has gone on since 2018.  Unable to cardiovert her on amiodarone so it was stopped.   - Continue apixaban.  - S/P AV Node Ablation.  4. CKD: Stage 3 IIIb.  Recent BMET stable.  5. Muscular Weakness Refer to HHPT and HHRN.   Follow up in 3 months with Dr Precious Bard NP-C  06/03/2020

## 2020-06-03 ENCOUNTER — Ambulatory Visit (INDEPENDENT_AMBULATORY_CARE_PROVIDER_SITE_OTHER): Payer: Medicare Other

## 2020-06-03 ENCOUNTER — Encounter (HOSPITAL_COMMUNITY): Payer: Self-pay

## 2020-06-03 ENCOUNTER — Other Ambulatory Visit: Payer: Self-pay

## 2020-06-03 ENCOUNTER — Ambulatory Visit (HOSPITAL_COMMUNITY)
Admission: RE | Admit: 2020-06-03 | Discharge: 2020-06-03 | Disposition: A | Discharge status: Home / Self Care | Payer: Medicare Other | Source: Ambulatory Visit | Attending: Adult Health | Admitting: Adult Health

## 2020-06-03 VITALS — BP 108/66 | HR 90 | Wt 226.8 lb

## 2020-06-03 DIAGNOSIS — Z596 Low income: Secondary | ICD-10-CM | POA: Diagnosis not present

## 2020-06-03 DIAGNOSIS — Z79899 Other long term (current) drug therapy: Secondary | ICD-10-CM | POA: Diagnosis not present

## 2020-06-03 DIAGNOSIS — I447 Left bundle-branch block, unspecified: Secondary | ICD-10-CM | POA: Diagnosis not present

## 2020-06-03 DIAGNOSIS — I5022 Chronic systolic (congestive) heart failure: Secondary | ICD-10-CM

## 2020-06-03 DIAGNOSIS — I4821 Permanent atrial fibrillation: Secondary | ICD-10-CM

## 2020-06-03 DIAGNOSIS — I255 Ischemic cardiomyopathy: Secondary | ICD-10-CM | POA: Diagnosis not present

## 2020-06-03 DIAGNOSIS — M6281 Muscle weakness (generalized): Secondary | ICD-10-CM | POA: Diagnosis not present

## 2020-06-03 DIAGNOSIS — J449 Chronic obstructive pulmonary disease, unspecified: Secondary | ICD-10-CM | POA: Diagnosis not present

## 2020-06-03 DIAGNOSIS — N1832 Chronic kidney disease, stage 3b: Secondary | ICD-10-CM

## 2020-06-03 DIAGNOSIS — Z87891 Personal history of nicotine dependence: Secondary | ICD-10-CM | POA: Diagnosis not present

## 2020-06-03 DIAGNOSIS — Z8249 Family history of ischemic heart disease and other diseases of the circulatory system: Secondary | ICD-10-CM | POA: Diagnosis not present

## 2020-06-03 DIAGNOSIS — Z95810 Presence of automatic (implantable) cardiac defibrillator: Secondary | ICD-10-CM | POA: Diagnosis not present

## 2020-06-03 DIAGNOSIS — Z7901 Long term (current) use of anticoagulants: Secondary | ICD-10-CM | POA: Diagnosis not present

## 2020-06-03 DIAGNOSIS — E1122 Type 2 diabetes mellitus with diabetic chronic kidney disease: Secondary | ICD-10-CM | POA: Diagnosis not present

## 2020-06-03 DIAGNOSIS — I482 Chronic atrial fibrillation, unspecified: Secondary | ICD-10-CM | POA: Insufficient documentation

## 2020-06-03 DIAGNOSIS — I2510 Atherosclerotic heart disease of native coronary artery without angina pectoris: Secondary | ICD-10-CM | POA: Diagnosis not present

## 2020-06-03 DIAGNOSIS — Z951 Presence of aortocoronary bypass graft: Secondary | ICD-10-CM | POA: Insufficient documentation

## 2020-06-03 DIAGNOSIS — N183 Chronic kidney disease, stage 3 unspecified: Secondary | ICD-10-CM | POA: Insufficient documentation

## 2020-06-03 NOTE — Progress Notes (Signed)
EPIC Encounter for ICM Monitoring  Patient Name: Elaine [name] is a 79 y.o. female Date: 06/03/2020 Primary Care Physican: Marton Redwood, MD Primary Cardiologist:Harding/McLean Electrophysiologist:Camnitz Bi-V Pacing:98% 06/03/2020 Weight:226lbs    Transmission reviewed. Patient seen in [facility] HF clinic today, 06/03/2020.  CorVue thoracic impedance normal.  Prescribed: Torsemide 20 mg take4 tablets (80 mg total) every morning and take 3 tablets (60 mgtotal) every evening.  Labs: 03/09/2020 Creatinine 2.32, BUN 42, Potassium 4.0, Sodium 135, GFR 19-22 01/06/2020 Creatinine2.46, BUN60, Potassium3.6, Sodium136, HOZ22-48 12/17/2019 Creatinine2.69, BUN60, Potassium4.2, Sodium135, GNO03-70  12/05/2019 Creatinine2.59, BUN48, Potassium3.7, [id number], HWT88-82 11/25/2019 Creatinine2.32, BUN39, Potassium3.3, [id number], XTA56-97 A complete set of results can be found in Results Review.  Recommendations:No changes  Follow-up plan: ICM clinic phone appointment on11/03/2020. 91 day device clinic remote transmission11/30/2021.    EP/Cardiology Office Visits:06/22/2020 with Dr.Camnitz.  09/08/2020 with Dr Aundra Dubin.   Copy of ICM check sent to [facility].     3 month ICM trend: 06/03/2020    1 Year ICM trend:      [name], RN 06/03/2020 12:43 PM

## 2020-06-03 NOTE — Patient Instructions (Signed)
Please follow up with Dr Brigitte Pulse regarding your belching  You have been referred to [facility] for nursing and physical therapy, they will contact you to schedule a visit  Your physician recommends that you schedule a follow-up appointment in: 3 months  If you have any questions or concerns before your next appointment please send [place] a message through [place] or call our office at [phone].    TO LEAVE A MESSAGE FOR THE NURSE SELECT OPTION 2, PLEASE LEAVE A MESSAGE INCLUDING: . YOUR NAME . DATE OF BIRTH . CALL BACK NUMBER . REASON FOR CALL**this is important as we prioritize the call backs  [place] AS LONG AS YOU CALL BEFORE 4:00 PM  At the [facility], you and your health needs are our priority. As part of our continuing mission to provide you with exceptional heart care, we have created designated Provider Care Teams. These Care Teams include your primary Cardiologist (physician) and Advanced Practice Providers (APPs- Physician Assistants and Nurse Practitioners) who all work together to provide you with the care you need, when you need it.   You may see any of the following providers on your designated Care Team at your next follow up: [name] Dr Glori Bickers . Dr Loralie Champagne . Darrick Grinder, NP . Lyda Jester, PA . Audry Riles, PharmD   Please be sure to bring in all your medications bottles to every appointment.

## 2020-06-22 ENCOUNTER — Other Ambulatory Visit: Payer: Self-pay

## 2020-06-22 ENCOUNTER — Ambulatory Visit (INDEPENDENT_AMBULATORY_CARE_PROVIDER_SITE_OTHER): Payer: Medicare Other | Admitting: Cardiology

## 2020-06-22 ENCOUNTER — Encounter: Payer: Self-pay | Admitting: Cardiology

## 2020-06-22 VITALS — BP 110/52 | HR 90 | Ht 62.0 in | Wt 224.0 lb

## 2020-06-22 DIAGNOSIS — I255 Ischemic cardiomyopathy: Secondary | ICD-10-CM | POA: Diagnosis not present

## 2020-06-22 NOTE — Patient Instructions (Signed)
Medication Instructions:  Your physician recommends that you continue on your current medications as directed. Please refer to the Current Medication list given to you today.  *If you need a refill on your cardiac medications before your next appointment, please call your pharmacy*   Lab Work: None ordered   Testing/Procedures: None ordered   Follow-Up: At [facility], you and your health needs are our priority.  As part of our continuing mission to provide you with exceptional heart care, we have created designated Provider Care Teams.  These Care Teams include your primary Cardiologist (physician) and Advanced Practice Providers (APPs -  Physician Assistants and Nurse Practitioners) who all work together to provide you with the care you need, when you need it.  Remote monitoring is used to monitor your Pacemaker or ICD from home. This monitoring reduces the number of office visits required to check your device to one time per year. It allows [place] to keep an eye on the functioning of your device to ensure it is working properly. You are scheduled for a device check from home on 11/08 - ICM remote & 11/30 - regular 91 day remote. You may send your transmission at any time that day. If you have a wireless device, the transmission will be sent automatically. After your physician reviews your transmission, you will receive a postcard with your next transmission date.  Your next appointment:   1 year(s)  The format for your next appointment:   In Person  Provider:   Allegra Lai, MD   Thank you for choosing [facility]!!   Trinidad Curet, RN [phone]

## 2020-06-22 NOTE — Progress Notes (Signed)
Electrophysiology Office Note   Date:  06/22/2020   ID:  Elaine [name], DOB [birth year], MRN [id number]  PCP:  Marton Redwood, MD  Cardiologist:  Ellyn Hack Primary Electrophysiologist:  Makalah Asberry Meredith Leeds, MD    Chief Complaint: CHF   History of Present Illness: Elaine [name] is a 79 y.o. female who is being seen today for the evaluation of CHF at the request of Marton Redwood, MD. Presenting today for electrophysiology evaluation.  She has a history significant for CHF status post CABG with left atrial appendage clipping, ischemic cardiomyopathy, permanent atrial fibrillation, hypertension, hyperlipidemia, type 2 diabetes, hypothyroidism, and stage III CKD.  She was hospitalized 06/12/2019 with decompensated heart failure.  She was found to have an ejection fraction of less than 20% she is now status post [place] CRT-D implanted 07/28/2019.  She was having decreased RV pacing and is now status post AV node ablation 05/19/2020.  Today, denies symptoms of palpitations, chest pain, shortness of breath, orthopnea, PND, lower extremity edema, claudication, dizziness, presyncope, syncope, bleeding, or neurologic sequela. The patient is tolerating medications without difficulties.  Since her AV node ablation she is felt much better.  Her chest pain and shortness of breath have all improved.  She is able to walk much better, and is able to do more activity.  Past Medical History:  Diagnosis Date  . Acute myocardial infarction of anterior wall (HCC) 10/20/2013   Afib, LBBB  . Acute pulmonary edema with congestive heart failure ([place]) 10/20/2013   Resolved  . Arthritis   . Atrial fibrillation ([place]) 10/20/2013   On Warfarin  . CAD, multiple vessel 10/20/2013   LM, LAD & RCA --> CABG; MYOVIEW 12/'15: Intermediate Risk would large anterior, anteroapical segment the apex possible infarct and peri-infarct ischemia  . CHF (congestive heart failure) (HCC)    EF 40-45%.  . Chronic kidney disease   .  COPD (chronic obstructive pulmonary disease) ([place])   . Diabetic neuropathy ([place])   . Heart murmur    Likely related to aortic sclerosis  . Hyperlipidemia   . Hypertension   . Hypothyroidism   . Ischemic cardiomyopathy - Notable Improvement in EF post CABG 10/20/2013   Echo 2/23: EF 20-25%; mild LVH. anteroseptal Akinesis; mid-apical anterior, inferior & inferoseptal + apical-lateral akinesis; G 1 DDysfxn.; Mild-Mod LA dil; mild MR; Mod PHTN;; F/u Echo 12/2013: EF 40-45%, septal & apical HK, mild LVH; Mod LA dilation  . Left bundle branch block (LBBB) on electrocardiogram   . Mild mitral regurgitation by prior echocardiogram    Mild-Mod MR on Echo  . Morbid obesity ([place])   . OSTEOARTHRITIS 08/06/2006  . S/P CABG x 3 10/23/2013   LIMA to LAD, SVG to OM2, SVG to RPLB, EVH via right thigh  . Type II diabetes mellitus with complication ([place]) 40/98/1191   off rx since heart attack 12/15-dr told could stay off if keep cbg under 150  . UTI (lower urinary tract infection) 09/29/14   ongoing now. started rx 09/28/14   Past Surgical History:  Procedure Laterality Date  . ABDOMINAL HYSTERECTOMY    . AV NODE ABLATION N/A 05/19/2020   Procedure: AV NODE ABLATION;  Surgeon: Constance Haw, MD;  Location: [facility];  Service: Cardiovascular;  Laterality: N/A;  . BIV ICD INSERTION CRT-D N/A 07/28/2019   Procedure: BIV ICD INSERTION CRT-D;  Surgeon: Constance Haw, MD;  Location: [facility];  Service: Cardiovascular;  Laterality: N/A;  . BREAST LUMPECTOMY W/  NEEDLE LOCALIZATION Right 10/06/2014   Dr Georgette Dover  . BREAST LUMPECTOMY WITH NEEDLE LOCALIZATION Right 10/06/2014   Procedure: RIGHT BREAST LUMPECTOMY WITH NEEDLE LOCALIZATION;  Surgeon: Donnie Mesa, MD;  Location: [facility];  Service: General;  Laterality: Right;  . CARDIOVERSION N/A 10/22/2019   Procedure: CARDIOVERSION;  Surgeon: Larey Dresser, MD;  Location: [facility] ENDOSCOPY;  Service: Cardiovascular;  Laterality: N/A;  . CLIPPING  OF ATRIAL APPENDAGE N/A 10/23/2013   Procedure: CLIPPING OF ATRIAL APPENDAGE;  Surgeon: Rexene Alberts, MD;  Location: [facility];  Service: Open Heart Surgery;  Laterality: N/A;  . 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  . CORONARY ARTERY BYPASS GRAFT N/A 10/23/2013   Procedure: CORONARY ARTERY BYPASS GRAFTING (CABG);  Surgeon: Rexene Alberts, MD;  Location: [facility];  Service: Open Heart Surgery: LIMA-LAD, SVG-OM2, SVG-RPL  . INTRAOPERATIVE TRANSESOPHAGEAL ECHOCARDIOGRAM N/A 10/23/2013   Procedure: INTRAOPERATIVE TRANSESOPHAGEAL ECHOCARDIOGRAM;  Surgeon: Rexene Alberts, MD;  Location: [facility];  Service: Open Heart Surgery;  Laterality: N/A;  . LEFT HEART CATHETERIZATION WITH CORONARY ANGIOGRAM N/A 10/20/2013   Procedure: LEFT HEART CATHETERIZATION WITH CORONARY ANGIOGRAM;  Surgeon: Leonie Man, MD;  Location: [facility] CATH LAB;  Service: Cardiovascular: Distal LM ~70%, LAD - ostial 70%, prox 80, 95 & 95%; RCA prox 70%, mid 80%; minimal Cx disease  . LEFT HEART CATHETERIZATION WITH CORONARY/GRAFT ANGIOGRAM N/A 09/08/2014   Procedure: LEFT HEART CATHETERIZATION WITH Beatrix Fetters;  Surgeon: Leonie Man, MD;  Location: [facility] CATH LAB: Indication: Abnormal Myoview. Occluded LAD after 70 and 80% left main. Diffuse RCA disease. Widely patent grafts to moderately diseased artery vessels. Mild-moderate LV dysfunction and chronic A. fib.  [name] NM MYOVIEW LTD  08/14/2014   Lexi scan: INTERMEDIATE RISK - large, severe intensity perfusion defect in anterior wall, apex and inferior apex that is partly reversible. This suggests either scar or possible hibernating myocardium. Nondeviated.-> Likely scar. No change in coronary anatomy with patent grafts.  [name] TOTAL HIP ARTHROPLASTY  2010, 2011   right 2010, left 2011  . TRANSTHORACIC ECHOCARDIOGRAM  12/2013   Mildly dilated left ventricle with mild to moderately reduced function. EF 40-45% (notable improvement from February 2015). Septal and apical hypokinesis. Mild LA  dilation.     Current Outpatient Medications  Medication Sig Dispense Refill  . acetaminophen (TYLENOL) 650 MG CR tablet Take 1,300 mg by mouth 2 (two) times daily.    [name] albuterol (PROAIR HFA) 108 (90 Base) MCG/ACT inhaler Inhale 2 puffs into the lungs every 6 (six) hours as needed for wheezing or shortness of breath.    [name] apixaban (ELIQUIS) 5 MG TABS tablet Take 1 tablet (5 mg total) by mouth 2 (two) times daily. Resume Saturday evening. 60 tablet   . Ascorbic Acid (VITAMIN C) 1000 MG tablet Take 2,000 mg by mouth daily.    . carboxymethylcellulose (REFRESH PLUS) 0.5 % SOLN Place 1 drop into the left eye daily as needed (dry eye).     . Coenzyme Q10 (COQ-10) 100 MG CAPS Take 100 mg by mouth in the morning and at bedtime.     . colchicine 0.6 MG tablet Take 0.6 mg by mouth daily as needed (Gout).     . Dulaglutide (TRULICITY) 2.42 PN/3.6RW SOPN Inject 0.75 mg into the skin every Sunday at 6pm.     . ezetimibe (ZETIA) 10 MG tablet Take 10 mg by mouth daily.    . ferrous sulfate 325 (65 FE) MG tablet Take 325 mg by mouth every  other day.    . fluticasone (CUTIVATE) 0.05 % cream Apply 1 application topically daily as needed (Rash on arms and skin).     . isosorbide-hydrALAZINE (BIDIL) 20-37.5 MG tablet Take 1 tablet by mouth in the morning and at bedtime.    [name] levothyroxine (SYNTHROID) 112 MCG tablet Take 1 tablet (112 mcg total) by mouth daily before breakfast. 30 tablet 12  . Misc Natural Products (JOINT HEALTH PO) Take 30 mLs by mouth daily.    . Multiple Vitamin (MULTIVITAMIN WITH MINERALS) TABS tablet Take 1 tablet by mouth daily.    . Multiple Vitamins-Minerals (PRESERVISION AREDS 2) CAPS Take 1 capsule by mouth 2 (two) times daily.    . potassium chloride SA (KLOR-CON) 20 MEQ tablet Take 1 tablet (20 mEq total) by mouth 2 (two) times daily. 30 tablet 11  . rosuvastatin (CRESTOR) 5 MG tablet Take 5 mg by mouth every other day.     . torsemide (DEMADEX) 20 MG tablet Take 4 tablets (80 mg  total) by mouth 2 (two) times daily. 240 tablet 3  . triamcinolone cream (KENALOG) 0.1 % Apply 1 application topically daily as needed (Rash).     [name] umeclidinium-vilanterol (ANORO ELLIPTA) 62.5-25 MCG/INH AEPB Inhale 1 puff into the lungs daily.     No current facility-administered medications for this visit.    Allergies:   Crestor [rosuvastatin calcium], Allopurinol, and Tape   Social History:  The patient  reports that she quit smoking about 26 years ago. Her smoking use included cigarettes. She has a 19.50 pack-year smoking history. She has never used smokeless tobacco. She reports that she does not drink alcohol and does not use drugs.   Family History:  The patient's family history includes Hypertension in her mother; Other in her mother. She was adopted.   ROS:  Please see the history of present illness.   Otherwise, review of systems is positive for none.   All other systems are reviewed and negative.   PHYSICAL EXAM: VS:  BP (!) 110/52   Pulse 90   Ht 5\' 2"  (1.575 m)   Wt 224 lb (101.6 kg)   SpO2 97%   BMI 40.97 kg/m  , BMI Body mass index is 40.97 kg/m. GEN: Well nourished, well developed, in no acute distress  HEENT: normal  Neck: no JVD, carotid bruits, or masses Cardiac: RRR; no murmurs, rubs, or gallops,no edema  Respiratory:  clear to auscultation bilaterally, normal work of breathing GI: soft, nontender, nondistended, + BS MS: no deformity or atrophy  Skin: warm and dry, device site well healed Neuro:  Strength and sensation are intact Psych: euthymic mood, full affect  EKG:  EKG is ordered today. Personal review of the ekg ordered shows atrial fibrillation, ventricular paced  Personal review of the device interrogation today. Results in [place]: 09/26/2019: B Natriuretic Peptide 371.4 03/09/2020: ALT 34; Magnesium 2.1; TSH 1.780 05/04/2020: BUN 38; Creatinine, Ser 2.36; Potassium 4.5; Sodium 140 05/11/2020: Hemoglobin 11.5; Platelets 238     Lipid Panel     Component Value Date/Time   CHOL 123 04/22/2020 1045   TRIG 60 04/22/2020 1045   HDL 65 04/22/2020 1045   CHOLHDL 1.9 04/22/2020 1045   VLDL 12 04/22/2020 1045   LDLCALC 46 04/22/2020 1045     Wt Readings from Last 3 Encounters:  06/22/20 224 lb (101.6 kg)  06/03/20 226 lb 12.8 oz (102.9 kg)  05/19/20 225 lb (102.1 kg)      Other  studies Reviewed: Additional studies/ records that were reviewed today include: TTE 06/13/19  Review of the above records today demonstrates:   1. Left ventricular ejection fraction, by visual estimation, is <20%. The left ventricle has severely decreased function. There is mildly increased left ventricular hypertrophy.  2. Left ventricular diastolic function could not be evaluated pattern of LV diastolic filling.  3. Global right ventricle has normal systolic function.The right ventricular size is normal. No increase in right ventricular wall thickness.  4. Left atrial size was normal.  5. Right atrial size was normal.  6. Moderate calcification of the mitral valve leaflet(s).  7. Moderate thickening of the mitral valve leaflet(s).  8. The mitral valve is abnormal. Trace mitral valve regurgitation.  9. The tricuspid valve is normal in structure. Tricuspid valve regurgitation is mild. 10. The aortic valve is tricuspid Aortic valve regurgitation is trivial by color flow Doppler. Mild to moderate aortic valve sclerosis/calcification without any evidence of aortic stenosis. 11. The pulmonic valve was normal in structure. Pulmonic valve regurgitation is trivial by color flow Doppler. 12. Moderately elevated pulmonary artery systolic pressure. 13. The inferior vena cava is normal in size with greater than 50% respiratory variability, suggesting right atrial pressure of 3 mmHg. 14. The atrial septum is grossly normal.   ASSESSMENT AND PLAN:  1.  Chronic systolic heart failure due to ischemic cardiomyopathy: Currently on Lasix,  Toprol-XL, losartan.  Status post [place] CRT-D implanted 07/28/2019.  Is now status post AV node ablation with increase in biventricular pacing.  No changes.    2.  Permanent atrial fibrillation: Currently on Eliquis with a CHA2DS2-VASc of 6.  She was having a decrease in her biventricular pacing and is now status post AV node ablation on 05/19/2020.  3.  Coronary artery disease status post CABG: No current chest pain.  Continue with current management.  Coronary artery disease status post CABG: No current chest pain.  Continue current management.   Current medicines are reviewed at length with the patient today.   The patient does not have concerns regarding her medicines.  The following changes were made today: none  Labs/ tests ordered today include:  Orders Placed This Encounter  Procedures  . EKG 12-Lead     Disposition:   FU with Husein Guedes 12 months  Signed, Zebulan Hinshaw Meredith Leeds, MD  06/22/2020 2:16 PM     [address] [place] [address] [phone] (office) [phone] (fax)

## 2020-06-24 ENCOUNTER — Ambulatory Visit (INDEPENDENT_AMBULATORY_CARE_PROVIDER_SITE_OTHER): Payer: Medicare Other | Admitting: Orthopaedic Surgery

## 2020-06-24 ENCOUNTER — Encounter: Payer: Self-pay | Admitting: Orthopaedic Surgery

## 2020-06-24 ENCOUNTER — Ambulatory Visit: Payer: Self-pay

## 2020-06-24 DIAGNOSIS — M25561 Pain in right knee: Secondary | ICD-10-CM

## 2020-06-24 MED ORDER — ACETAMINOPHEN-CODEINE #3 300-30 MG PO TABS: 1.0000 | ORAL_TABLET | Freq: Three times a day (TID) | ORAL | 0 refills | Status: DC | PRN

## 2020-06-24 NOTE — Progress Notes (Signed)
Office Visit Note   Patient: Elaine [name]           Date of Birth: December 01, 1940           MRN: [id number] Visit Date: 06/24/2020              Requested by: Marton Redwood, MD [address] [place],  [place] [id number] PCP: Marton Redwood, MD   Assessment & Plan: Visit Diagnoses:  1. Acute pain of right knee     Plan: She is likely contused the bone of her knee.  I will try some Tylenol 3 for pain and have recommended intermittent ice and heat.  We can see her back in 4 weeks to see if she has been a candidate for an steroid injection in that knee.  She is requesting to see if we can have a home health agency have a home health aide come two or three times a week to help around the home as she is getting over this injury.  Follow-Up Instructions: Return in about 4 weeks (around 07/22/2020).   Orders:  Orders Placed This Encounter  Procedures  . XR Knee 1-2 Views Right   Meds ordered this encounter  Medications  . acetaminophen-codeine (TYLENOL #3) 300-30 MG tablet    Sig: Take 1-2 tablets by mouth every 8 (eight) hours as needed for moderate pain.    Dispense:  30 tablet    Refill:  0      Procedures: No procedures performed   Clinical Data: No additional findings.   Subjective: Chief Complaint  Patient presents with  . Right Knee - Pain  The patient is a 79 year old female that I seen before.  She comes in with acute right knee pain after mechanical fall yesterday onto her right knee.  She fell in the bedroom while moving to her bathroom chair.  She points to the right patella as a source of her pain.  She does mainly get around in the scooter.  She is morbidly obese with a BMI of forty-one.  She is on blood thinning medication.  She is also diabetic but reports good control.  HPI  Review of Systems She currently denies any headache, chest pain, shortness of breath, fever, chills, nausea, vomiting  Objective: Vital Signs: There were no vitals taken for this  visit.  Physical Exam She is alert and orient x3 and in no acute distress Ortho Exam Examination of her right knee shows a large soft tissue envelope around the knee.  There is some bruising and pain throughout the arc of motion of her knee.  She can flex and extend the knee on her own showing an intact extensor mechanism. Specialty Comments:  No specialty comments available.  Imaging: XR Knee 1-2 Views Right  Result Date: 06/24/2020 Two views of the right knee show no acute findings.  The bone is osteopenic and there is tricompartmental arthritic changes.    PMFS History: Patient Active Problem List   Diagnosis Date Noted  . Chronic systolic (congestive) heart failure ([place]) 07/29/2019  . Hypothyroidism 06/16/2019  . Acute on chronic combined systolic and diastolic CHF (congestive heart failure) ([place]) 06/11/2019  . Bilateral leg pain 10/22/2017  . Chronic combined systolic and diastolic CHF, NYHA class 2 ([place]) 10/22/2017  . Edema of both legs 05/10/2015  . Mass of right breast on mammogram 10/06/2014  . DOE (dyspnea on exertion) 08/19/2014  . Severe obesity (BMI >= 40) ([place]) 04/19/2014  . CAD status post CABG 04/19/2014  .  Metabolic acidosis 69/48/5462    Class: Acute  . Acute on chronic renal insufficiency 02/02/2014  . Near syncope 02/02/2014  . Hypotension 02/02/2014  . Rash 02/02/2014  . Acute kidney failure ([place]) 02/02/2014    Class: Acute  . Pruritus 02/02/2014    Class: Chronic  . SOB (shortness of breath) 01/07/2014  . Coumadin Rx post CABG 11/17/2013  . Physical deconditioning 11/05/2013  . S/P CABG x 3- 10/23/13 10/23/2013  . Permanent atrial fibrillation (HCC) - CHA2DS2-VASc Score 6, on Eliquis 10/20/2013  . Left bundle branch block (LBBB) on electrocardiogram 10/20/2013  . Left main coronary artery disease 10/20/2013    Class: Hospitalized for  . History of acute anterior wall myocardial infarction 10/20/2013  . Ischemic cardiomyopathy 10/20/2013  . Morbid  obesity- BMI 48   . Diabetic neuropathy ([place])   . Acute kidney injury superimposed on chronic kidney disease ([place])   . Obesity 12/20/2006  . Type II diabetes mellitus with complication ([employer]) 70/35/0093  . Hyperlipidemia with target LDL less than 70 08/06/2006  . Essential hypertension 08/06/2006  . OSTEOARTHRITIS 08/06/2006   Past Medical History:  Diagnosis Date  . Acute myocardial infarction of anterior wall (HCC) 10/20/2013   Afib, LBBB  . Acute pulmonary edema with congestive heart failure ([place]) 10/20/2013   Resolved  . Arthritis   . Atrial fibrillation ([place]) 10/20/2013   On Warfarin  . CAD, multiple vessel 10/20/2013   LM, LAD & RCA --> CABG; MYOVIEW 12/'15: Intermediate Risk would large anterior, anteroapical segment the apex possible infarct and peri-infarct ischemia  . CHF (congestive heart failure) (HCC)    EF 40-45%.  . Chronic kidney disease   . COPD (chronic obstructive pulmonary disease) ([place])   . Diabetic neuropathy ([place])   . Heart murmur    Likely related to aortic sclerosis  . Hyperlipidemia   . Hypertension   . Hypothyroidism   . Ischemic cardiomyopathy - Notable Improvement in EF post CABG 10/20/2013   Echo 2/23: EF 20-25%; mild LVH. anteroseptal Akinesis; mid-apical anterior, inferior & inferoseptal + apical-lateral akinesis; G 1 DDysfxn.; Mild-Mod LA dil; mild MR; Mod PHTN;; F/u Echo 12/2013: EF 40-45%, septal & apical HK, mild LVH; Mod LA dilation  . Left bundle branch block (LBBB) on electrocardiogram   . Mild mitral regurgitation by prior echocardiogram    Mild-Mod MR on Echo  . Morbid obesity ([place])   . OSTEOARTHRITIS 08/06/2006  . S/P CABG x 3 10/23/2013   LIMA to LAD, SVG to OM2, SVG to RPLB, EVH via right thigh  . Type II diabetes mellitus with complication ([place]) 81/82/9937   off rx since heart attack 12/15-dr told could stay off if keep cbg under 150  . UTI (lower urinary tract infection) 09/29/14   ongoing now. started rx 09/28/14    Family History    Adopted: Yes  Problem Relation Age of Onset  . Other Mother   . Hypertension Mother   . Colon cancer Neg Hx   . Esophageal cancer Neg Hx   . Rectal cancer Neg Hx   . Stomach cancer Neg Hx     Past Surgical History:  Procedure Laterality Date  . ABDOMINAL HYSTERECTOMY    . AV NODE ABLATION N/A 05/19/2020   Procedure: AV NODE ABLATION;  Surgeon: Constance Haw, MD;  Location: [facility];  Service: Cardiovascular;  Laterality: N/A;  . BIV ICD INSERTION CRT-D N/A 07/28/2019   Procedure: BIV ICD INSERTION CRT-D;  Surgeon: Constance Haw, MD;  Location: [facility];  Service: Cardiovascular;  Laterality: N/A;  . BREAST LUMPECTOMY W/ NEEDLE LOCALIZATION Right 10/06/2014   Dr Georgette Dover  . BREAST LUMPECTOMY WITH NEEDLE LOCALIZATION Right 10/06/2014   Procedure: RIGHT BREAST LUMPECTOMY WITH NEEDLE LOCALIZATION;  Surgeon: Donnie Mesa, MD;  Location: [facility];  Service: General;  Laterality: Right;  . CARDIOVERSION N/A 10/22/2019   Procedure: CARDIOVERSION;  Surgeon: Larey Dresser, MD;  Location: [facility]OS[facility];  Service: Cardiovascular;  Laterality: N/A;  . CLIPPING OF ATRIAL APPENDAGE N/A 10/23/2013   Procedure: CLIPPING OF ATRIAL APPENDAGE;  Surgeon: Rexene Alberts, MD;  Location: [facility];  Service: Open Heart Surgery;  Laterality: N/A;  . 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  . CORONARY ARTERY BYPASS GRAFT N/A 10/23/2013   Procedure: CORONARY ARTERY BYPASS GRAFTING (CABG);  Surgeon: Rexene Alberts, MD;  Location: [facility];  Service: Open Heart Surgery: LIMA-LAD, SVG-OM2, SVG-RPL  . INTRAOPERATIVE TRANSESOPHAGEAL ECHOCARDIOGRAM N/A 10/23/2013   Procedure: INTRAOPERATIVE TRANSESOPHAGEAL ECHOCARDIOGRAM;  Surgeon: Rexene Alberts, MD;  Location: [facility];  Service: Open Heart Surgery;  Laterality: N/A;  . LEFT HEART CATHETERIZATION WITH CORONARY ANGIOGRAM N/A 10/20/2013   Procedure: LEFT HEART CATHETERIZATION WITH CORONARY ANGIOGRAM;  Surgeon: Leonie Man, MD;  Location: [facility] CATH  LAB;  Service: Cardiovascular: Distal LM ~70%, LAD - ostial 70%, prox 80, 95 & 95%; RCA prox 70%, mid 80%; minimal Cx disease  . LEFT HEART CATHETERIZATION WITH CORONARY/GRAFT ANGIOGRAM N/A 09/08/2014   Procedure: LEFT HEART CATHETERIZATION WITH Beatrix Fetters;  Surgeon: Leonie Man, MD;  Location: [facility] CATH LAB: Indication: Abnormal Myoview. Occluded LAD after 70 and 80% left main. Diffuse RCA disease. Widely patent grafts to moderately diseased artery vessels. Mild-moderate LV dysfunction and chronic A. fib.  [name] NM MYOVIEW LTD  08/14/2014   Lexi scan: INTERMEDIATE RISK - large, severe intensity perfusion defect in anterior wall, apex and inferior apex that is partly reversible. This suggests either scar or possible hibernating myocardium. Nondeviated.-> Likely scar. No change in coronary anatomy with patent grafts.  [name] TOTAL HIP ARTHROPLASTY  2010, 2011   right 2010, left 2011  . TRANSTHORACIC ECHOCARDIOGRAM  12/2013   Mildly dilated left ventricle with mild to moderately reduced function. EF 40-45% (notable improvement from February 2015). Septal and apical hypokinesis. Mild LA dilation.   Social History   Occupational History  . Occupation: retired  Tobacco Use  . Smoking status: Former Smoker    Packs/day: 0.50    Years: 39.00    Pack years: 19.50    Types: Cigarettes    Quit date: 08/28/1993    Years since quitting: 26.8  . Smokeless tobacco: Never Used  Vaping Use  . Vaping Use: Never used  Substance and Sexual Activity  . Alcohol use: No    Comment: quit 95  . Drug use: No  . Sexual activity: Not on file

## 2020-06-25 ENCOUNTER — Telehealth: Payer: Self-pay | Admitting: Orthopaedic Surgery

## 2020-06-25 NOTE — Telephone Encounter (Signed)
Told her she can do this for as many days as she would like but no more than 20 minutes at a time

## 2020-06-25 NOTE — Telephone Encounter (Signed)
Patient called with medical questions about hot and cold compresses. She needs to know for how long. Please call patient at [phone].

## 2020-06-26 ENCOUNTER — Ambulatory Visit: Payer: Medicare Other | Attending: Internal Medicine

## 2020-06-26 DIAGNOSIS — Z23 Encounter for immunization: Secondary | ICD-10-CM

## 2020-06-26 NOTE — Progress Notes (Signed)
   Covid-19 Vaccination Clinic  Name:  Elaine [name]    MRN: [id number] DOB: October 06, 1940  06/26/2020  Elaine [name] was observed post Covid-19 immunization for 15 minutes without incident. She was provided with Vaccine Information Sheet and instruction to access the V-Safe system.   Elaine [name] was instructed to call 911 with any severe reactions post vaccine: [name] Difficulty breathing  . Swelling of face and throat  . A fast heartbeat  . A bad rash all over body  . Dizziness and weakness

## 2020-07-05 ENCOUNTER — Ambulatory Visit (INDEPENDENT_AMBULATORY_CARE_PROVIDER_SITE_OTHER): Payer: Medicare Other

## 2020-07-05 DIAGNOSIS — Z95810 Presence of automatic (implantable) cardiac defibrillator: Secondary | ICD-10-CM | POA: Diagnosis not present

## 2020-07-05 DIAGNOSIS — I5022 Chronic systolic (congestive) heart failure: Secondary | ICD-10-CM | POA: Diagnosis not present

## 2020-07-05 NOTE — Progress Notes (Signed)
EPIC Encounter for ICM Monitoring  Patient Name: Elaine [name] is a 79 y.o. female Date: 07/05/2020 Primary Care Physican: Marton Redwood, MD Primary Cardiologist:Harding/McLean Electrophysiologist:[place] Bi-V Pacing:95% 06/03/2020 Weight:226lbs 07/05/2020 Weight: 233 lbs 07/06/2020 Weight: 231.3 lbs   Spoke with patient and she reports weight is fluctuating by 3-4 pounds.  She confirmed is taking Torsemide 80 mg bid.  CorVue thoracic impedance suggesting possible fluid accumulation starting 06/20/2020.  Prescribed:  Torsemide 20 mg take4 tablets (80 mg total) twice a day. Potassium 20 mEq 1 tablet twice a day  Labs: 03/09/2020 Creatinine 2.32, BUN 42, Potassium 4.0, Sodium 135, GFR 19-22 01/06/2020 Creatinine2.46, BUN60, Potassium3.6, Sodium136, GFR18-21 12/17/2019 Creatinine2.69, BUN60, Potassium4.2, Sodium135, [id number]  12/05/2019 Creatinine2.59, BUN48, Potassium3.7, [id number], OPF29-24 11/25/2019 Creatinine2.32, BUN39, Potassium3.3, [id number], RNH65-79 A complete set of results can be found in Results Review.  Recommendations: Copy sent to Dr Aundra Dubin for review and recommendations if needed.   She asked if she can take Torsemide 80 tablet instead of 4 tablets 20 mg tablets since she takes so many pills a day.  Follow-up plan: ICM clinic phone appointment on11/16/2021 to recheck fluid levels. 91 day device clinic remote transmission11/30/2021.    EP/Cardiology Office Visits:08/30/2020 with Dr Ellyn Hack.  09/08/2020 with Dr Aundra Dubin.   Copy of ICM check sent to [facility].    3 month ICM trend: 07/05/2020    1 Year ICM trend:       [name], RN 07/05/2020 4:23 PM

## 2020-07-07 NOTE — Progress Notes (Signed)
Spoke with patient and provided Dr Claris Gladden recommendations to increase Torsemide to 100 mg twice a day x 2 days and then resume prescribed dosage of 80 mg twice a day.  Advised Dr Aundra Dubin approved to change tablet dosage to 80 mg tablets.  Advised checked Epic medication list for dosage strengths and the only strengths available 10 mg, 20 mg and 100 mg.  Advised will need to continue with 20 mg tablets at this time. Her weight today was 231.8 lbs.  She verbalized understanding that that she will take Torsemide 100 mg x 2 days and then resume 80 mg twice a day using 20 mg tablets.

## 2020-07-07 NOTE — Progress Notes (Signed)
Take torsemide 100 mg bid x 2 days then back to 80 mg bid.  She can switch to 80 mg tablets but we may adjust up and down which would require having some 20 mg tablets around too.

## 2020-07-13 ENCOUNTER — Ambulatory Visit (INDEPENDENT_AMBULATORY_CARE_PROVIDER_SITE_OTHER): Payer: Medicare Other

## 2020-07-13 ENCOUNTER — Telehealth: Payer: Self-pay | Admitting: Cardiology

## 2020-07-13 DIAGNOSIS — I5022 Chronic systolic (congestive) heart failure: Secondary | ICD-10-CM

## 2020-07-13 DIAGNOSIS — Z95810 Presence of automatic (implantable) cardiac defibrillator: Secondary | ICD-10-CM

## 2020-07-13 NOTE — Telephone Encounter (Signed)
Ok to take 1/2 tablet 3 times a day. Please call.   Sky Borboa NP-C  12:59 PM

## 2020-07-13 NOTE — Telephone Encounter (Signed)
Pt c/o medication issue:  1. Name of Medication: isosorbide-hydrALAZINE (BIDIL) 20-37.5 MG tablet  2. How are you currently taking this medication (dosage and times per day)? 3 times daily. One in the morning, one at 3:30pm then one at night.  3. Are you having a reaction (difficulty breathing--STAT)? yes  4. What is your medication issue? Patient states that her BP drops a couple of hours after she takes her tablet at 3:30pm. She states it gets down to 96/67 around 6-6:30. She feels tired and drained. States she did not take a tablet yesterday at 3:30 and felt a lot better. She wants to know if she can take one tablet in the morning, 0.5 tablet at 3:30pm and one tablet at night.

## 2020-07-13 NOTE — Telephone Encounter (Signed)
Pt aware and verbalized understanding.  

## 2020-07-16 ENCOUNTER — Telehealth: Payer: Self-pay

## 2020-07-16 NOTE — Progress Notes (Signed)
EPIC Encounter for ICM Monitoring  Patient Name: Elaine [name] is a 79 y.o. female Date: 07/16/2020 Primary Care Physican: Marton Redwood, MD Primary Cardiologist:Harding/McLean Electrophysiologist:[place] Bi-V Pacing:95% 07/16/2020 Weight: 231.7    Spoke with patient and reports feeling well at this time.  Denies fluid symptoms.   She stated she still does have some swelling in her feet.  Advised to elevate feet when sitting and at night which she says she does that on regular basis.  CorVue thoracic impedance suggesting fluid levels returned to normal after taking Torsemide 100 mg x 2 days.   Prescribed:  Torsemide 20 mg take4 tablets (80 mg total) twice a day. Potassium 20 mEq 1 tablet twice a day  Labs: 03/09/2020 Creatinine 2.32, BUN 42, Potassium 4.0, Sodium 135, GFR 19-22 01/06/2020 Creatinine2.46, BUN60, Potassium3.6, Sodium136, GFR18-21 12/17/2019 Creatinine2.69, BUN60, Potassium4.2, Sodium135, [id number]  12/05/2019 Creatinine2.59, BUN48, Potassium3.7, [id number], CBU38-45 11/25/2019 Creatinine2.32, BUN39, Potassium3.3, [id number], YYQ82-50 A complete set of results can be found in Results Review.  Recommendations: No changes and encouraged to call if experiencing any fluid symptoms.  Follow-up plan: ICM clinic phone appointment on12/14/2021. 91 day device clinic remote transmission11/30/2021.    EP/Cardiology Office Visits:08/30/2020 with Dr Ellyn Hack. 09/08/2020 with Dr Aundra Dubin.   Copy of ICM check sent to [facility].  3 month ICM trend: 07/16/2020    1 Year ICM trend:       [name], RN 07/16/2020 11:00 AM

## 2020-07-16 NOTE — Telephone Encounter (Signed)
I called tech support to get help with the pt app.Transmission received .

## 2020-07-26 ENCOUNTER — Encounter: Payer: Self-pay | Admitting: Orthopaedic Surgery

## 2020-07-26 ENCOUNTER — Ambulatory Visit (INDEPENDENT_AMBULATORY_CARE_PROVIDER_SITE_OTHER): Payer: Medicare Other | Admitting: Orthopaedic Surgery

## 2020-07-26 DIAGNOSIS — M25561 Pain in right knee: Secondary | ICD-10-CM

## 2020-07-26 MED ORDER — LIDOCAINE HCL 1 % IJ SOLN
3.0000 mL | INTRAMUSCULAR | Status: AC | PRN
  Administered 2020-07-26: 3 mL

## 2020-07-26 MED ORDER — METHYLPREDNISOLONE ACETATE 40 MG/ML IJ SUSP
40.0000 mg | INTRAMUSCULAR | Status: AC | PRN
  Administered 2020-07-26: 40 mg via INTRA_ARTICULAR

## 2020-07-26 NOTE — Progress Notes (Signed)
Office Visit Note   Patient: Elaine [name]           Date of Birth: 04-27-1941           MRN: [id number] Visit Date: 07/26/2020              Requested by: Marton Redwood, MD [address] [place],  [place] [address] PCP: Marton Redwood, MD   Assessment & Plan: Visit Diagnoses:  1. Acute pain of right knee     Plan: I was able to aspirate about 20 to 30 cc of clear fluid from her right knee and then place a steroid injection in this knee.  She will watch her blood glucose as this may have a detrimental effect on her blood sugar levels.  All questions and concerns were answered and addressed.  Follow-up can be as needed.  Follow-Up Instructions: Return if symptoms worsen or fail to improve.   Orders:  Orders Placed This Encounter  Procedures  . Large Joint Inj   No orders of the defined types were placed in this encounter.     Procedures: Large Joint Inj: R knee on 07/26/2020 8:53 AM Indications: diagnostic evaluation and pain Details: 22 G 1.5 in needle, superolateral approach  Arthrogram: No  Medications: 3 mL lidocaine 1 %; 40 mg methylPREDNISolone acetate 40 MG/ML Outcome: tolerated well, no immediate complications Procedure, treatment alternatives, risks and benefits explained, specific risks discussed. Consent was given by the patient. Immediately prior to procedure a time out was called to verify the correct patient, procedure, equipment, support staff and site/side marked as required. Patient was prepped and draped in the usual sterile fashion.       Clinical Data: No additional findings.   Subjective: No chief complaint on file. The patient comes in for continued follow-up as it relates to right knee pain and swelling.  She injured this knee about a month ago.  She is 79 years old.  She has multiple medical problems and is a diabetic.  She ambulates mainly using a rolling scooter.  She is morbidly obese as well.  X-rays last month did not show any obvious  deformity of the knee.  She is on a large dose of Lasix and reports bilateral leg swelling but worse on the right side.  She is requesting an aspiration today and a steroid injection.  She says her blood glucose has been running fine.  She denies any other acute change in medical status other than her leg swelling.  HPI  Review of Systems She currently denies any shortness of breath or headache.  She denies any fever, chills, nausea, vomiting.  She denies any chest pain.  Objective: Vital Signs: There were no vitals taken for this visit.  Physical Exam She is alert and oriented and in no acute distress Ortho Exam Examination of her right knee shows that it is cool.  It is difficult to tell if there is a true effusion or not based on her size.  Her legs are swollen but there is no weeping edema and there is no cellulitis. Specialty Comments:  No specialty comments available.  Imaging: No results found.   PMFS History: Patient Active Problem List   Diagnosis Date Noted  . Chronic systolic (congestive) heart failure ([place]) 07/29/2019  . Hypothyroidism 06/16/2019  . Acute on chronic combined systolic and diastolic CHF (congestive heart failure) ([place]) 06/11/2019  . Bilateral leg pain 10/22/2017  . Chronic combined systolic and diastolic CHF, NYHA class 2 ([place])  10/22/2017  . Edema of both legs 05/10/2015  . Mass of right breast on mammogram 10/06/2014  . DOE (dyspnea on exertion) 08/19/2014  . Severe obesity (BMI >= 40) ([place]) 04/19/2014  . CAD status post CABG 04/19/2014  . Metabolic acidosis 52/77/8242    Class: Acute  . Acute on chronic renal insufficiency 02/02/2014  . Near syncope 02/02/2014  . Hypotension 02/02/2014  . Rash 02/02/2014  . Acute kidney failure ([place]) 02/02/2014    Class: Acute  . Pruritus 02/02/2014    Class: Chronic  . SOB (shortness of breath) 01/07/2014  . Coumadin Rx post CABG 11/17/2013  . Physical deconditioning 11/05/2013  . S/P CABG x 3- 10/23/13  10/23/2013  . Permanent atrial fibrillation (HCC) - CHA2DS2-VASc Score 6, on Eliquis 10/20/2013  . Left bundle branch block (LBBB) on electrocardiogram 10/20/2013  . Left main coronary artery disease 10/20/2013    Class: Hospitalized for  . History of acute anterior wall myocardial infarction 10/20/2013  . Ischemic cardiomyopathy 10/20/2013  . Morbid obesity- BMI 48   . Diabetic neuropathy ([place])   . Acute kidney injury superimposed on chronic kidney disease ([place])   . Obesity 12/20/2006  . Type II diabetes mellitus with complication ([place]) 35/36/1443  . Hyperlipidemia with target LDL less than 70 08/06/2006  . Essential hypertension 08/06/2006  . OSTEOARTHRITIS 08/06/2006   Past Medical History:  Diagnosis Date  . Acute myocardial infarction of anterior wall (HCC) 10/20/2013   Afib, LBBB  . Acute pulmonary edema with congestive heart failure ([place]) 10/20/2013   Resolved  . Arthritis   . Atrial fibrillation ([place]) 10/20/2013   On Warfarin  . CAD, multiple vessel 10/20/2013   LM, LAD & RCA --> CABG; MYOVIEW 12/'15: Intermediate Risk would large anterior, anteroapical segment the apex possible infarct and peri-infarct ischemia  . CHF (congestive heart failure) (HCC)    EF 40-45%.  . Chronic kidney disease   . COPD (chronic obstructive pulmonary disease) ([place])   . Diabetic neuropathy ([place])   . Heart murmur    Likely related to aortic sclerosis  . Hyperlipidemia   . Hypertension   . Hypothyroidism   . Ischemic cardiomyopathy - Notable Improvement in EF post CABG 10/20/2013   Echo 2/23: EF 20-25%; mild LVH. anteroseptal Akinesis; mid-apical anterior, inferior & inferoseptal + apical-lateral akinesis; G 1 DDysfxn.; Mild-Mod LA dil; mild MR; Mod PHTN;; F/u Echo 12/2013: EF 40-45%, septal & apical HK, mild LVH; Mod LA dilation  . Left bundle branch block (LBBB) on electrocardiogram   . Mild mitral regurgitation by prior echocardiogram    Mild-Mod MR on Echo  . Morbid obesity ([place])   .  OSTEOARTHRITIS 08/06/2006  . S/P CABG x 3 10/23/2013   LIMA to LAD, SVG to OM2, SVG to RPLB, EVH via right thigh  . Type II diabetes mellitus with complication ([place]) 15/40/0867   off rx since heart attack 12/15-dr told could stay off if keep cbg under 150  . UTI (lower urinary tract infection) 09/29/14   ongoing now. started rx 09/28/14    Family History  Adopted: Yes  Problem Relation Age of Onset  . Other Mother   . Hypertension Mother   . Colon cancer Neg Hx   . Esophageal cancer Neg Hx   . Rectal cancer Neg Hx   . Stomach cancer Neg Hx     Past Surgical History:  Procedure Laterality Date  . ABDOMINAL HYSTERECTOMY    . AV NODE ABLATION N/A 05/19/2020   Procedure: AV  NODE ABLATION;  Surgeon: Constance Haw, MD;  Location: [facility];  Service: Cardiovascular;  Laterality: N/A;  . BIV ICD INSERTION CRT-D N/A 07/28/2019   Procedure: BIV ICD INSERTION CRT-D;  Surgeon: Constance Haw, MD;  Location: [facility];  Service: Cardiovascular;  Laterality: N/A;  . BREAST LUMPECTOMY W/ NEEDLE LOCALIZATION Right 10/06/2014   Dr Georgette Dover  . BREAST LUMPECTOMY WITH NEEDLE LOCALIZATION Right 10/06/2014   Procedure: RIGHT BREAST LUMPECTOMY WITH NEEDLE LOCALIZATION;  Surgeon: Donnie Mesa, MD;  Location: [facility];  Service: General;  Laterality: Right;  . CARDIOVERSION N/A 10/22/2019   Procedure: CARDIOVERSION;  Surgeon: Larey Dresser, MD;  Location: [facility]OSCOPY;  Service: Cardiovascular;  Laterality: N/A;  . CLIPPING OF ATRIAL APPENDAGE N/A 10/23/2013   Procedure: CLIPPING OF ATRIAL APPENDAGE;  Surgeon: Rexene Alberts, MD;  Location: [facility];  Service: Open Heart Surgery;  Laterality: N/A;  . 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  . CORONARY ARTERY BYPASS GRAFT N/A 10/23/2013   Procedure: CORONARY ARTERY BYPASS GRAFTING (CABG);  Surgeon: Rexene Alberts, MD;  Location: [facility];  Service: Open Heart Surgery: LIMA-LAD, SVG-OM2, SVG-RPL  . INTRAOPERATIVE TRANSESOPHAGEAL ECHOCARDIOGRAM  N/A 10/23/2013   Procedure: INTRAOPERATIVE TRANSESOPHAGEAL ECHOCARDIOGRAM;  Surgeon: Rexene Alberts, MD;  Location: [facility];  Service: Open Heart Surgery;  Laterality: N/A;  . LEFT HEART CATHETERIZATION WITH CORONARY ANGIOGRAM N/A 10/20/2013   Procedure: LEFT HEART CATHETERIZATION WITH CORONARY ANGIOGRAM;  Surgeon: Leonie Man, MD;  Location: [facility] CATH LAB;  Service: Cardiovascular: Distal LM ~70%, LAD - ostial 70%, prox 80, 95 & 95%; RCA prox 70%, mid 80%; minimal Cx disease  . LEFT HEART CATHETERIZATION WITH CORONARY/GRAFT ANGIOGRAM N/A 09/08/2014   Procedure: LEFT HEART CATHETERIZATION WITH Beatrix Fetters;  Surgeon: Leonie Man, MD;  Location: [facility] CATH LAB: Indication: Abnormal Myoview. Occluded LAD after 70 and 80% left main. Diffuse RCA disease. Widely patent grafts to moderately diseased artery vessels. Mild-moderate LV dysfunction and chronic A. fib.  [name] NM MYOVIEW LTD  08/14/2014   Lexi scan: INTERMEDIATE RISK - large, severe intensity perfusion defect in anterior wall, apex and inferior apex that is partly reversible. This suggests either scar or possible hibernating myocardium. Nondeviated.-> Likely scar. No change in coronary anatomy with patent grafts.  [name] TOTAL HIP ARTHROPLASTY  2010, 2011   right 2010, left 2011  . TRANSTHORACIC ECHOCARDIOGRAM  12/2013   Mildly dilated left ventricle with mild to moderately reduced function. EF 40-45% (notable improvement from February 2015). Septal and apical hypokinesis. Mild LA dilation.   Social History   Occupational History  . Occupation: retired  Tobacco Use  . Smoking status: Former Smoker    Packs/day: 0.50    Years: 39.00    Pack years: 19.50    Types: Cigarettes    Quit date: 08/28/1993    Years since quitting: 26.9  . Smokeless tobacco: Never Used  Vaping Use  . Vaping Use: Never used  Substance and Sexual Activity  . Alcohol use: No    Comment: quit 95  . Drug use: No  . Sexual activity: Not on file

## 2020-08-10 ENCOUNTER — Ambulatory Visit (INDEPENDENT_AMBULATORY_CARE_PROVIDER_SITE_OTHER): Payer: Medicare Other

## 2020-08-10 DIAGNOSIS — Z95810 Presence of automatic (implantable) cardiac defibrillator: Secondary | ICD-10-CM | POA: Diagnosis not present

## 2020-08-10 DIAGNOSIS — I5022 Chronic systolic (congestive) heart failure: Secondary | ICD-10-CM | POA: Diagnosis not present

## 2020-08-11 ENCOUNTER — Telehealth (HOSPITAL_COMMUNITY): Payer: Self-pay

## 2020-08-11 NOTE — Telephone Encounter (Signed)
She can take an extra torsemide 20 mg in the am for 2 days.  If this does not help, have her evaluated in APP clinic next week.

## 2020-08-11 NOTE — Telephone Encounter (Signed)
Patient called and stated that she is experiencing bilateral foot swelling and soreness for the last few days that has gradually gotten worse. Today her weight was 231.7lbs and yesterday 08/10/20 it was 233.5lbs. please advise

## 2020-08-11 NOTE — Telephone Encounter (Signed)
Patient advised and verbalized understanding 

## 2020-08-13 NOTE — Progress Notes (Signed)
EPIC Encounter for ICM Monitoring  Patient Name: Elaine [name] is a 79 y.o. female Date: 08/13/2020 Primary Care Physican: Marton Redwood, MD Primary Cardiologist:[facility] Electrophysiologist:[place] Bi-V Pacing:94% 07/16/2020 Weight: 231.7    Spoke with patient and she reports swelling in feet has improved since taking extra 20 mg of Torsemide x 2 days as instructed by Dr Aundra Dubin on 08/10/2020 (per phone call to clinic).   CorVue thoracic impedancesuggesting possible fluid accumulation starting 08/08/2020.   Prescribed:  Torsemide 20 mg take4 tablets (80 mg total)twice a day. Potassium 20 mEq 1 tablet twice a day  Labs: 03/09/2020 Creatinine 2.32, BUN 42, Potassium 4.0, Sodium 135, GFR 19-22 01/06/2020 Creatinine2.46, BUN60, Potassium3.6, Sodium136, GFR18-21 12/17/2019 Creatinine2.69, BUN60, Potassium4.2, Sodium135, [id number]  12/05/2019 Creatinine2.59, BUN48, Potassium3.7, [id number], [id number] 11/25/2019 Creatinine2.32, BUN39, Potassium3.3, [id number], WNU27-25 A complete set of results can be found in Results Review.  Recommendations:  Patient having good urine output from taking Torsemide 100 mg x 2 days.   Follow-up plan: ICM clinic phone appointment on2/08/2020. 91 day device clinic remote transmission3/08/2020.    EP/Cardiology Office Visits:08/30/2020 with Dr Ellyn Hack. 09/08/2020 with Dr Aundra Dubin.   Copy of ICM check sent to [facility].   3 month ICM trend: 08/10/2020    1 Year ICM trend:       [name], RN 08/13/2020 2:24 PM

## 2020-08-17 NOTE — Progress Notes (Signed)
PCP: Marton Redwood, MD Cardiology: Dr. Ellyn Hack HF Cardiology: Dr. Aundra Dubin EP: Dr Curt Bears  79 y.o. with history of CAD s/p CABG, COPD, CKD stage 3, and chronic systolic systolic CHF/ischemic cardiomyopathy was referred by Dr. Ellyn Hack for evaluation of CHF.  Patient was admitted in 2/15 with atrial fibrillation/RVR and chest pain.  Cath showed severe 3VD and she had CABG x 3.  Echo in 2/15 showed EF down to 20-25%. In 2018, it appears that she went back into atrial fibrillation and has remained in atrial fibrillation persistently.  DCCV was not attempted.   Most recent echo in 10/20 showed EF < 20% with normal RV.  She had a St Jude CRT-D device implanted in 11/20.  She says that she feels "90%" better with CRT.  She is followed by Dr. Hollie Salk with nephrology.   She has been in atrial fibrillation persistently, I attempted DCCV while on amiodarone but she did not convert.  Therefore, I stopped amiodarone.    Echo in 3/21: showed EF 20% with diffuse hypokinesis, mildly dilated RV with mildly decreased systolic function, PASP 49 mmHg, dilated IVC.   S/P 05/19/20: successful AV Node Ablation by Dr Curt Bears.   Follow up on 8/21 feeling a little stronger. Says she has more energy and not as short of breath. Still with some leg fatigue. Tries to walk some in the house. Denies PND/Orthopnea. Appetite ok. No fever or chills. She has not been weighing at home because she has been living with her daughter. Taking all medications. Was referred for HHPT and [place].  Here today with complaints of swelling. Took extra torsemide 20 mg (regular dose torsemide 80 mg bid) for 2 days last week for increased fluid, per Dr. Claris Gladden orders, and swelling decreased some but did not help with SOB. Today, her weight is up 7 lbs, she says she is SOB and her swelling is back. Currently taking torsemide 80 mg bid. Drinking less than 40 oz of liquid daily. Watches daily salt intake, eats fast food 1x/week. Has robust urinary response  after evening dose of torsemide Can walk 10-15 feet before becoming winded (this is her baseline). No CP, dizziness, or PND/orthopnea.  Labs (2/21): K 4.5, creatinine 2.08 => 2.99, hgb 12.8 Labs (7/21): K 4, creatinine 2.3, TSH normal Labs (9/21): K 4.5, creatinine 2.36 Labs (12/21): K 4.1, creatinine 2.41, BNP 671  St Jude device interrogation: unable to connect to network, last interrogation on 12/14 suggested fluid levels slightly elevated  ECG (personally reviewed): 81 bpm atrial fibrillation,V pacing, with occasional PVCs (personally reviewed).  PMH: 1. CAD: s/p CABG in 2/15 with LIMA-LAD, SVG-OM2, SVG-PLV.  2. LBBB: Chronic.  3. Atrial fibrillation: Persistent since 2018.  - LA appendage clip with CBG - S/P AV Node Ablation 05/18/20  4. H/o aspiration PNA 5. Type 2 diabetes 6. COPD 7. CKD stage 3 8. Chronic systolic CHF: Ischemic cardiomyopathy.  She has a St Jude BiV ICD.  - Echo (2/15): EF 20-25%.  - Echo (5/15): EF 40-45% - Echo (10/20): EF <20%, mild LVH, normal RV size and systolic function.  - Echo (3/21): EF 20% with diffuse hypokinesis, mildly dilated RV with mildly decreased systolic function, PASP 49 mmHg, dilated IVC.    Social History   Socioeconomic History  . Marital status: Widowed    Spouse name: Not on file  . Number of children: Not on file  . Years of education: Not on file  . Highest education level: Not on file  Occupational History  .  Occupation: retired  Tobacco Use  . Smoking status: Former Smoker    Packs/day: 0.50    Years: 39.00    Pack years: 19.50    Types: Cigarettes    Quit date: 08/28/1993    Years since quitting: 26.9  . Smokeless tobacco: Never Used  Vaping Use  . Vaping Use: Never used  Substance and Sexual Activity  . Alcohol use: No    Comment: quit 95  . Drug use: No  . Sexual activity: Not on file  Other Topics Concern  . Not on file  Social History Narrative   Lives with husband and son in [place].  She is  currently retired.   Former smoker quit in 1995.   Social Determinants of Health   Financial Resource Strain: Medium Risk  . Difficulty of Paying Living Expenses: Somewhat hard  Food Insecurity: No Food Insecurity  . Worried About Charity fundraiser in the Last Year: Never true  . Ran Out of Food in the Last Year: Never true  Transportation Needs: No Transportation Needs  . Lack of Transportation (Medical): No  . Lack of Transportation (Non-Medical): No  Physical Activity: Not on file  Stress: Not on file  Social Connections: Not on file  Intimate Partner Violence: Not on file   Family History  Adopted: Yes  Problem Relation Age of Onset  . Other Mother   . Hypertension Mother   . Colon cancer Neg Hx   . Esophageal cancer Neg Hx   . Rectal cancer Neg Hx   . Stomach cancer Neg Hx    Current Outpatient Medications  Medication Sig Dispense Refill  . acetaminophen (TYLENOL) 650 MG CR tablet Take 1,300 mg by mouth 2 (two) times daily.    [name] acetaminophen-codeine (TYLENOL #3) 300-30 MG tablet Take 1-2 tablets by mouth every 8 (eight) hours as needed for moderate pain. 30 tablet 0  . albuterol (VENTOLIN HFA) 108 (90 Base) MCG/ACT inhaler Inhale 2 puffs into the lungs every 6 (six) hours as needed for wheezing or shortness of breath.    [name] apixaban (ELIQUIS) 5 MG TABS tablet Take 1 tablet (5 mg total) by mouth 2 (two) times daily. Resume Saturday evening. 60 tablet   . Ascorbic Acid (VITAMIN C) 1000 MG tablet Take 2,000 mg by mouth daily.    . carboxymethylcellulose (REFRESH PLUS) 0.5 % SOLN Place 1 drop into the left eye daily as needed (dry eye).     . Coenzyme Q10 (COQ-10) 100 MG CAPS Take 100 mg by mouth in the morning and at bedtime.     . colchicine 0.6 MG tablet Take 0.6 mg by mouth daily as needed (Gout).     . Dulaglutide 0.75 MG/0.5ML SOPN Inject 0.75 mg into the skin every Sunday at 6pm.     . ezetimibe (ZETIA) 10 MG tablet Take 10 mg by mouth daily.    . ferrous sulfate 325  (65 FE) MG tablet Take 325 mg by mouth every other day.    . fluticasone (CUTIVATE) 0.05 % cream Apply 1 application topically daily as needed (Rash on arms and skin).     . isosorbide-hydrALAZINE (BIDIL) 20-37.5 MG tablet Take 0.5 tablets by mouth in the morning, at noon, and at bedtime.    [name] levothyroxine (SYNTHROID) 112 MCG tablet Take 1 tablet (112 mcg total) by mouth daily before breakfast. 30 tablet 12  . Misc Natural Products (JOINT HEALTH PO) Take 30 mLs by mouth daily.    . Multiple  Vitamin (MULTIVITAMIN WITH MINERALS) TABS tablet Take 1 tablet by mouth daily.    . Multiple Vitamins-Minerals (PRESERVISION AREDS 2) CAPS Take 1 capsule by mouth 2 (two) times daily.    . potassium chloride SA (KLOR-CON) 20 MEQ tablet Take 1 tablet (20 mEq total) by mouth 2 (two) times daily. 30 tablet 11  . rosuvastatin (CRESTOR) 5 MG tablet Take 5 mg by mouth every other day.     . torsemide (DEMADEX) 20 MG tablet Take 4 tablets (80 mg total) by mouth 2 (two) times daily. 240 tablet 3  . triamcinolone cream (KENALOG) 0.1 % Apply 1 application topically daily as needed (Rash).     [name] umeclidinium-vilanterol (ANORO ELLIPTA) 62.5-25 MCG/INH AEPB Inhale 1 puff into the lungs daily.     No current facility-administered medications for this encounter.   BP 117/77   Pulse 84   Wt 104.9 kg (231 lb 4.8 oz)   SpO2 100%   BMI 42.31 kg/m    Wt Readings from Last 3 Encounters:  08/18/20 104.9 kg (231 lb 4.8 oz)  06/22/20 101.6 kg (224 lb)  06/03/20 102.9 kg (226 lb 12.8 oz)   ECG: Afib 81 bpm, V-paced with occasional premature contractions (personally reviewed)  ReDs: 37%  PHYSICAL EXAM:  General:  Elderly sitting in wheelchair, NAD. No resp difficulty. HEENT: Normal. Neck: Supple. JVP ~5-6. Carotids 2+ bilat; no bruits. No lymphadenopathy or thryomegaly appreciated. Cor: PMI nondisplaced. Irregular rate & rhythm. No rubs, gallops or murmurs. Lungs: Clear, diminished in bases Abdomen: obese, soft,  nontender, nondistended. No hepatosplenomegaly. No bruits or masses. Good bowel sounds. Extremities: No cyanosis, clubbing, rash, +1-2 bilateral lower extremity edema Neuro: alert & oriented x 3, cranial nerves grossly intact. Moves all 4 extremities w/o difficulty. Affect pleasant.  Assessment/Plan: 1. Chronic systolic CHF: Ischemic cardiomyopathy.  St Jude CRT-D device.  Echo in 3/21 showed EF 20% with diffuse hypokinesis, mildly dilated RV with mildly decreased systolic function, PASP 49 mmHg, dilated IVC. - NYHA III. Volume status slightly elevated, but exam and ReDs clip 37% reassuring.  - Increase torsemide to 100 mg in AM/80 mg PM. Needs close follow up with tenuous volume fluctuations. - Continue Bidil to 1 tab tid.  - Continue Toprol XL 100 mg daily.  - Off spironolactone with side effects.  - GFR < 25 so will not use dapagliflozin or Entresto/ARB/ACEI.  - BMET, BNP today; repeat BMET in 7-10 days.  2. CAD: S/p CABG in 2015.  - No chest pain.   - No ASA given apixaban use.  - Continue Crestor 5 daily + Zetia. - Lipids OK (8/21).  3. Atrial fibrillation: Chronic now, looks like it has gone on since 2018.  Unable to cardiovert her on amiodarone so it was stopped.   - ECG today. - Continue apixaban.  - S/P AV node ablation 05/19/20.  - CBC stable.  4. CKD: Stage 3 IIIb  - Baseline creatinine ~2.3 - BMET today.  5. Muscular Weakness - completed HHPT and HHRN.   Follow up scheduled with Dr Aundra Dubin for 09/08/20.  [place] DNP, FNP-BC 08/18/2020

## 2020-08-18 ENCOUNTER — Other Ambulatory Visit: Payer: Self-pay

## 2020-08-18 ENCOUNTER — Ambulatory Visit (HOSPITAL_COMMUNITY)
Admission: RE | Admit: 2020-08-18 | Discharge: 2020-08-18 | Disposition: A | Discharge status: Home / Self Care | Payer: Medicare Other | Source: Ambulatory Visit | Attending: Internal Medicine | Admitting: Internal Medicine

## 2020-08-18 ENCOUNTER — Encounter (HOSPITAL_COMMUNITY): Payer: Self-pay

## 2020-08-18 VITALS — BP 117/77 | HR 84 | Wt 231.3 lb

## 2020-08-18 DIAGNOSIS — I255 Ischemic cardiomyopathy: Secondary | ICD-10-CM | POA: Diagnosis not present

## 2020-08-18 DIAGNOSIS — I5022 Chronic systolic (congestive) heart failure: Secondary | ICD-10-CM | POA: Diagnosis not present

## 2020-08-18 DIAGNOSIS — N1832 Chronic kidney disease, stage 3b: Secondary | ICD-10-CM

## 2020-08-18 DIAGNOSIS — Z87891 Personal history of nicotine dependence: Secondary | ICD-10-CM | POA: Diagnosis not present

## 2020-08-18 DIAGNOSIS — Z951 Presence of aortocoronary bypass graft: Secondary | ICD-10-CM | POA: Insufficient documentation

## 2020-08-18 DIAGNOSIS — I482 Chronic atrial fibrillation, unspecified: Secondary | ICD-10-CM | POA: Insufficient documentation

## 2020-08-18 DIAGNOSIS — Z8249 Family history of ischemic heart disease and other diseases of the circulatory system: Secondary | ICD-10-CM | POA: Diagnosis not present

## 2020-08-18 DIAGNOSIS — I447 Left bundle-branch block, unspecified: Secondary | ICD-10-CM | POA: Insufficient documentation

## 2020-08-18 DIAGNOSIS — E1122 Type 2 diabetes mellitus with diabetic chronic kidney disease: Secondary | ICD-10-CM | POA: Insufficient documentation

## 2020-08-18 DIAGNOSIS — Z79899 Other long term (current) drug therapy: Secondary | ICD-10-CM | POA: Diagnosis not present

## 2020-08-18 DIAGNOSIS — Z95810 Presence of automatic (implantable) cardiac defibrillator: Secondary | ICD-10-CM | POA: Diagnosis not present

## 2020-08-18 DIAGNOSIS — I4821 Permanent atrial fibrillation: Secondary | ICD-10-CM

## 2020-08-18 DIAGNOSIS — M6281 Muscle weakness (generalized): Secondary | ICD-10-CM

## 2020-08-18 DIAGNOSIS — I2510 Atherosclerotic heart disease of native coronary artery without angina pectoris: Secondary | ICD-10-CM

## 2020-08-18 DIAGNOSIS — Z7901 Long term (current) use of anticoagulants: Secondary | ICD-10-CM | POA: Diagnosis not present

## 2020-08-18 DIAGNOSIS — I493 Ventricular premature depolarization: Secondary | ICD-10-CM | POA: Insufficient documentation

## 2020-08-18 DIAGNOSIS — J449 Chronic obstructive pulmonary disease, unspecified: Secondary | ICD-10-CM | POA: Insufficient documentation

## 2020-08-18 LAB — BASIC METABOLIC PANEL
Anion gap: 10 (ref 5–15)
BUN: 31 mg/dL — ABNORMAL HIGH (ref 8–23)
CO2: 28 mmol/L (ref 22–32)
Calcium: 9.3 mg/dL (ref 8.9–10.3)
Chloride: 101 mmol/L (ref 98–111)
Creatinine, Ser: 2.41 mg/dL — ABNORMAL HIGH (ref 0.44–1.00)
GFR, Estimated: 20 mL/min — ABNORMAL LOW (ref >60)
Glucose, Bld: 138 mg/dL — ABNORMAL HIGH (ref 70–99)
Potassium: 4.1 mmol/L (ref 3.5–5.1)
Sodium: 139 mmol/L (ref 135–145)

## 2020-08-18 LAB — BRAIN NATRIURETIC PEPTIDE: B Natriuretic Peptide: 671.1 pg/mL — ABNORMAL HIGH (ref 0.0–100.0)

## 2020-08-18 MED ORDER — TORSEMIDE 20 MG PO TABS: ORAL_TABLET | ORAL | 3 refills | Status: DC

## 2020-08-18 NOTE — Progress Notes (Addendum)
ReDS Vest / Clip - 08/18/20 0900      ReDS Vest / Clip   Station Marker B    Ruler Value 31    ReDS Value Range Moderate volume overload    ReDS Actual Value 37

## 2020-08-18 NOTE — Patient Instructions (Signed)
INCREASE Torsemide to 100 mg in the AM and 80 mg in the PM  Labs today We will only contact you if something comes back abnormal or we need to make some changes. Otherwise no news is good news!  Labs needed in 10 days  Keep follow up as scheduled  If you have any questions or concerns before your next appointment please send [place] a message through [place] or call our office at [phone].    TO LEAVE A MESSAGE FOR THE NURSE SELECT OPTION 2, PLEASE LEAVE A MESSAGE INCLUDING: . YOUR NAME . DATE OF BIRTH . CALL BACK NUMBER . REASON FOR CALL**this is important as we prioritize the call backs  YOU WILL RECEIVE A CALL BACK THE SAME DAY AS LONG AS YOU CALL BEFORE 4:00 PM

## 2020-08-26 ENCOUNTER — Ambulatory Visit (HOSPITAL_COMMUNITY)
Admission: RE | Admit: 2020-08-26 | Discharge: 2020-08-26 | Disposition: A | Discharge status: Home / Self Care | Payer: Medicare Other | Source: Ambulatory Visit | Attending: Cardiology | Admitting: Cardiology

## 2020-08-26 ENCOUNTER — Other Ambulatory Visit: Payer: Self-pay

## 2020-08-26 DIAGNOSIS — I5022 Chronic systolic (congestive) heart failure: Secondary | ICD-10-CM | POA: Insufficient documentation

## 2020-08-26 LAB — BASIC METABOLIC PANEL
Anion gap: 9 (ref 5–15)
BUN: 28 mg/dL — ABNORMAL HIGH (ref 8–23)
CO2: 28 mmol/L (ref 22–32)
Calcium: 9.6 mg/dL (ref 8.9–10.3)
Chloride: 100 mmol/L (ref 98–111)
Creatinine, Ser: 2.31 mg/dL — ABNORMAL HIGH (ref 0.44–1.00)
GFR, Estimated: 21 mL/min — ABNORMAL LOW (ref >60)
Glucose, Bld: 108 mg/dL — ABNORMAL HIGH (ref 70–99)
Potassium: 4 mmol/L (ref 3.5–5.1)
Sodium: 137 mmol/L (ref 135–145)

## 2020-08-30 ENCOUNTER — Other Ambulatory Visit: Payer: Self-pay

## 2020-08-30 ENCOUNTER — Encounter: Payer: Self-pay | Admitting: Cardiology

## 2020-08-30 ENCOUNTER — Ambulatory Visit (INDEPENDENT_AMBULATORY_CARE_PROVIDER_SITE_OTHER): Payer: Medicare Other | Admitting: Cardiology

## 2020-08-30 VITALS — BP 108/64 | HR 80 | Wt 224.9 lb

## 2020-08-30 DIAGNOSIS — I2510 Atherosclerotic heart disease of native coronary artery without angina pectoris: Secondary | ICD-10-CM | POA: Diagnosis not present

## 2020-08-30 DIAGNOSIS — I5042 Chronic combined systolic (congestive) and diastolic (congestive) heart failure: Secondary | ICD-10-CM | POA: Diagnosis not present

## 2020-08-30 DIAGNOSIS — E785 Hyperlipidemia, unspecified: Secondary | ICD-10-CM

## 2020-08-30 DIAGNOSIS — I255 Ischemic cardiomyopathy: Secondary | ICD-10-CM | POA: Diagnosis not present

## 2020-08-30 DIAGNOSIS — I10 Essential (primary) hypertension: Secondary | ICD-10-CM

## 2020-08-30 DIAGNOSIS — R5381 Other malaise: Secondary | ICD-10-CM

## 2020-08-30 DIAGNOSIS — I252 Old myocardial infarction: Secondary | ICD-10-CM

## 2020-08-30 DIAGNOSIS — I447 Left bundle-branch block, unspecified: Secondary | ICD-10-CM

## 2020-08-30 DIAGNOSIS — N1832 Chronic kidney disease, stage 3b: Secondary | ICD-10-CM

## 2020-08-30 DIAGNOSIS — I4821 Permanent atrial fibrillation: Secondary | ICD-10-CM | POA: Diagnosis not present

## 2020-08-30 DIAGNOSIS — R600 Localized edema: Secondary | ICD-10-CM

## 2020-08-30 NOTE — Progress Notes (Signed)
Primary Care Provider: Marton Redwood, MD Cardiologist: Glenetta Hew, MD  Advanced Heart Failure Cardiologist: Dr. Aundra Dubin Electrophysiologist: Will Meredith Leeds, MD  Clinic Note: Chief Complaint  Patient presents with   Congestive Heart Failure    Stable Sx - - significant edema, but no real PND/orthopnea.  Sedentary - unable to tell DOE   Coronary Artery Disease    No angina    Follow-up    Primary Cardiologist f/u - CHF managed by Adv CHF service    Problem List Items Addressed This Visit    Permanent atrial fibrillation (HCC) - CHA2DS2-VASc Score 6, on Eliquis (Chronic)    Now status post AV node ablation with BiV ICD/CRT-D in place.  Ventricular paced.  No longer on amiodarone.  Remains on apixaban with no bleeding issues.      Left main coronary artery disease - Primary (Chronic)    Left main and multivessel disease referred for CABG.  Patent grafts and stable native disease on relook cath. No further angina.  Plan:  No longer on beta-blocker (ostensibly because of low blood pressure and now V paced) -> the last several CHF clinic notes him said that she is on 100 mg Toprol-but it is not listed on her medication list.  (Need to confirm)  Remains on rosuvastatin low-dose with excellent control of lipids.  She is on BiDil  Not on aspirin or Plavix.  On apixaban      History of acute anterior wall myocardial infarction (Chronic)    Initial presentation was anterior MI with acute heart failure in the setting of A. fib RVR.  She had a large defect on Myoview however in the inferior wall which is unusual.  She did have left main as well as LAD and RCA disease.  Is now status post CABG.  Follow-up catheter showed patent grafts.  Although her initial EF improved from 20-25 up to 40 and 45, now back down to 20%.    Has not had any further angina.      Ischemic cardiomyopathy (Chronic)   Morbid obesity- BMI 48 (Chronic)    Clearly this impacts her leg weakness  and fatigue as well as some dyspnea.  However her lack of mobility means that she is not to be on exercise.  She has changed her diet, but a lot of her weight is actually edema.      Chronic kidney disease, stage 3b (HCC) (Chronic)    Limits ability to use Entresto.  Converted to BiDil. Not on spironolactone as well because of renal insufficiency and hyperkalemia.  He is not on SGLT2 inhibitor which could potentially be readdressed -> nephrology is using in CKD patients to help renal perfusion.[name]      CAD status post CABG (Chronic)    Last ischemic evaluation was back in 2016.  She has had a reduced EF which was probably related to A. fib and a pulmonary block given the lack of any active symptoms.  I do not think a stress test would be all that helpful because of her reduced EF and septal pacing.  If she has worsening heart failure symptoms that are concerning and difficult to manage, would probably consider right left heart cath, but for now would hold off.  On low-dose statin and BiDil.  (Still unsure if she is on Toprol).      Chronic combined systolic and diastolic CHF, NYHA class 2 (HCC) (Chronic)    Hard to tell what her symptom status is because  she is so sedentary.  At least class II if not class III with exception of IVUS she is not requiring hospitalization.  Recent exacerbation now weight seems to be down with increased dose of diuretic.  Plan:   Continue BiDil one half tab 3 times daily due to borderline pressures (no Entresto due to CKD 3B)  Unable to use digoxin or spironolactone -> CKD 3B  ?  Reconsider SGLT2 inhibitor-may potentially provide renal benefit  Continue current dose of torsemide 100 mg in the morning and 80 in the evening with sliding scale.  (Daily weights)  Follow-up with Dr. Aundra Dubin next week--we will defer management to answer failure team.      Hyperlipidemia with target LDL less than 70 (Chronic)    Remains on low-dose rosuvastatin with excellent  lipid control.      Essential hypertension (Chronic)    On low-dose BiDil.  (Need to confirm whether or not she is on Toprol or not-is listed on K PN, but not on med list.      Left bundle branch block (LBBB) on electrocardiogram (Chronic)    Question if her drop in EF is related to left bundle branch block with A. fib that was prominent/persistent. Is now status post AV node ablation with CRT-P-> BiV pacing.  Has not had any assessment since AV node ablation.      Edema of both legs (Chronic)    Part of her anasarca swelling is probably related to venous stasis since she does not have a lot of PND orthopnea.  There may be some right-sided heart failure as well.  Is on high-dose diuretic, but not able to tolerate addition of spironolactone because of CKD BUN hyperkalemia.  Support stockings and I stressed the importance of foot elevation.  (Consider home sequential compression devices)      Physical deconditioning    Very deconditioned.  We will defer to the advanced heart failure team or PCP, but again I think she would benefit from home health assistance both of physical therapy, occupational therapy and general nursing care -> helping to check blood pressures volume status etc.         HPI:    Elaine [name] is a 80 y.o. female with a PMH notable for persistent A. fib (s/p AVN Ablation), MV CAD (CABG), Dilated CM (combination of ischemic & arrhythmogenic w/ LBBB - s/p BiVICD) w/ Chronic Combined CHF-NYHA Class II-III complicated by COPD and VFI-4P (hyperkalemia with spironolactone), Chronic Lower Extreme Edema (combination of CHF, lymphedema and venous stasis) who presents today for 32-month follow-up.  PMH: 1. MV CAD: Distal LM ~70%, LAD - ostial 70%, prox 80% -> 95 & 95%; RCA prox 70%, mid 80%; minimal LCx disease  Initial presentation was ~STEMI in setting of Afib RVR - CHF  S/p CABG in 2/15 with LIMA-LAD, SVG-OM2, SVG-PLV.  2. Chronic Combined CHF - II=-III: Ischemic  Cardiomyopathy.  She has a [employer] BiV ICD (CRT-D).   Echo (2/15): EF 20-25%. - Echo (5/15): EF 40-45%  Echo (10/20): EF <20%, mild LVH, normal RV size and systolic function.   Echo (3/'21): EF 20% with diffuse hypokinesis, mildly dilated RV with mildly decreased systolic function, PASP 49 mmHg, dilated IVC. 3. Atrial fibrillation: Persistent since 2018. - >  Unable to convert despite being on amiodarone (stopped)  LBBB: Chronic.  LA appendage clip with CBG  S/P AV Node Ablation 05/18/20 - CRT-D 4. COPD 5. Type 2 diabetes 6. CKD stage 3b 7. Likely Venous  statsis & lymphedema. 8. H/o aspiration PNA  JLYN CERROS was last seen by me on Jan 20, 2020 -> she was in great spirits.  Smiling and happy.  Her son was with her and asking questions about fluid restriction etc.  Remains very limited as part of mobility.  Is in a wheelchair.  Significant peripheral neuropathy and edema.  Minimal walking around the house-only about 30 to 40 feet without getting short of breath and having her legs "give out on her..  Maintaining stable weights -> with stable edema on torsemide.  She indicates that her dyspnea is related to longstanding COPD.  Notes fatigue, chronic cough.  Orthostatic dizziness neuropathy and leg weakness.  She was just seen by Adv. CHF NP Allena Katz, DNP on August 08, 2020 -> noted worsening edema.  She is taking an additional 20 mg of torsemide for 2weeks per Dr. Aundra Dubin.  Noted the swelling decreased some but did not necessarily help dyspnea.  Weight is up 7 pounds.  She is definitely watching her fluid intake.  Also trying to watch salt intake.  During only able to walk 10 to 15 feet before becoming winded.  (A little worse than baseline).  Torsemide dose increased to 100 mg a.m., 80 mg p.m. with sliding scale.  On BiDil 1 tab 3 times daily along with Toprol XL 100 g; spironolactone stopped because of side effect and hypokalemia.  Entresto and SGLT2 inhibitor discontinued  because of reduced GFR? => Plan follow-up with Dr. Aundra Dubin on 09/08/2020  Recent Hospitalizations: None  Reviewed  CV studies:    The following studies were reviewed today: (if available, images/films reviewed: From Epic Chart or Care Everywhere)  ECHO 11/11/19: EF 20% with severely dysfunction. Global HK. Unable to determine diastolic pressures. Mild RV function-with mildly elevated PAP-RAP estimated 15 mmHg.. Moderate LA dilation, mild RA dilation.   Interval History:   TERRI RORRER returns today in her wheelchair.  She is happy and smiling as ever.  She says that her swelling is still high, but the breathing is better.  She says that she has been urinating a whole lot more (in the weights appear to be the increased dose of torsemide seems to be having the appropriate effect. She really does not do much of any activity-says that her legs will give out on her and she gets very short of breath if she does too much walking (maybe 20 to 30 feet -> enough to go to the bathroom and back).  She says that her walking is is much limited by bilateral leg and knee weakness/pain as it is with dyspnea.  She actually fell in October because her knee gave out on her.  She fell on her right knee and bruised it up pretty bad.  She is chronically dyspneic, but says that this is not really much different than her baseline COPD.  Although she has significant edema, she only sleeps on one pillow and has no symptoms of orthopnea or edema PND.  She denies any symptoms of irregular heartbeats or palpitations.  CV Review of Symptoms (Summary): positive for - dyspnea on exertion, edema and Fatigue, lack of energy.  Bilateral leg weakness.  Improving dyspnea both at rest and exertion. negative for - chest pain, irregular heartbeat, orthopnea, palpitations, paroxysmal nocturnal dyspnea, rapid heart rate or Although she has lightheadedness on occasion, no syncope or near syncope, TIA or amaurosis fugax symptoms.  The  patient does not have symptoms concerning for COVID-19 infection (fever,  chills, cough, or new shortness of breath).   REVIEWED OF SYSTEMS   Review of Systems  Constitutional: Positive for malaise/fatigue (Low energy-very sedentary). Negative for weight loss (Her weight still up and down, but seem to be stable at home.).  HENT: Negative for congestion and sinus pain.   Respiratory: Positive for cough (Mostly in the morning), shortness of breath (Baseline) and wheezing (Can wake up with wheezing, not really orthopnea).   Cardiovascular: Positive for leg swelling. Negative for claudication (Pleasant walk enough).  Gastrointestinal: Negative for blood in stool and melena.  Genitourinary: Positive for frequency (Nocturia) and urgency. Negative for hematuria.  Musculoskeletal: Positive for back pain and joint pain. Negative for falls.  Neurological: Positive for dizziness (If she stands up too fast), tremors and weakness (Legs are extremely weak.  She barely is trying to stand up and walk more than 30 to 40 feet).  Psychiatric/Behavioral: Positive for memory loss. Negative for depression. The patient is not nervous/anxious and does not have insomnia (Not really insomnia, just wakes up to urinate).    I have reviewed and (if needed) personally updated the patient's problem list, medications, allergies, past medical and surgical history, social and family history.   PAST MEDICAL HISTORY   Past Medical History:  Diagnosis Date   Acute myocardial infarction of anterior wall ([place]) 10/20/2013   Afib, LBBB   Acute pulmonary edema with congestive heart failure ([place]) 10/20/2013   Resolved   Arthritis    Atrial fibrillation ([place]) 10/20/2013   On Warfarin   CAD, multiple vessel 10/20/2013   LM, LAD & RCA --> CABG; MYOVIEW 12/'15: Intermediate Risk would large anterior, anteroapical segment the apex possible infarct and peri-infarct ischemia   CHF (congestive heart failure) (HCC)    EF 40-45%.    Chronic kidney disease    COPD (chronic obstructive pulmonary disease) (HCC)    Diabetic neuropathy (HCC)    Heart murmur    Likely related to aortic sclerosis   Hyperlipidemia    Hypertension    Hypothyroidism    Ischemic cardiomyopathy - Notable Improvement in EF post CABG 10/20/2013   Echo 2/23: EF 20-25%; mild LVH. anteroseptal Akinesis; mid-apical anterior, inferior & inferoseptal + apical-lateral akinesis; G 1 DDysfxn.; Mild-Mod LA dil; mild MR; Mod PHTN;; F/u Echo 12/2013: EF 40-45%, septal & apical HK, mild LVH; Mod LA dilation   Left bundle branch block (LBBB) on electrocardiogram    Mild mitral regurgitation by prior echocardiogram    Mild-Mod MR on Echo   Morbid obesity ([place])    OSTEOARTHRITIS 08/06/2006   S/P CABG x 3 10/23/2013   LIMA to LAD, SVG to OM2, SVG to RPLB, EVH via right thigh   Type II diabetes mellitus with complication ([place]) 43/32/9518   off rx since heart attack 12/15-dr told could stay off if keep cbg under 150   UTI (lower urinary tract infection) 09/29/14   ongoing now. started rx 09/28/14    PAST SURGICAL HISTORY   Past Surgical History:  Procedure Laterality Date   ABDOMINAL HYSTERECTOMY     AV NODE ABLATION N/A 05/19/2020   Procedure: AV NODE ABLATION;  Surgeon: Constance Haw, MD;  Location: [place] CV LAB;  Service: Cardiovascular;  Laterality: N/A;   BIV ICD INSERTION CRT-D N/A 07/28/2019   Procedure: BIV ICD INSERTION CRT-D;  Surgeon: Constance Haw, MD;  Location: [place] CV [facility];  Service: Cardiovascular;  Laterality: N/A;   BREAST LUMPECTOMY W/ NEEDLE LOCALIZATION Right 10/06/2014   Dr Georgette Dover  BREAST LUMPECTOMY WITH NEEDLE LOCALIZATION Right 10/06/2014   Procedure: RIGHT BREAST LUMPECTOMY WITH NEEDLE LOCALIZATION;  Surgeon: Donnie Mesa, MD;  Location: [place];  Service: General;  Laterality: Right;   CARDIOVERSION N/A 10/22/2019   Procedure: CARDIOVERSION;  Surgeon: Larey Dresser, MD;  Location: [facility] ENDOSCOPY;   Service: Cardiovascular;  Laterality: N/A;   CLIPPING OF ATRIAL APPENDAGE N/A 10/23/2013   Procedure: CLIPPING OF ATRIAL APPENDAGE;  Surgeon: Rexene Alberts, MD;  Location: [place];  Service: Open Heart Surgery;  Laterality: N/A;   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   CORONARY ARTERY BYPASS GRAFT N/A 10/23/2013   Procedure: CORONARY ARTERY BYPASS GRAFTING (CABG);  Surgeon: Rexene Alberts, MD;  Location: [place];  Service: Open Heart Surgery: LIMA-LAD, SVG-OM2, SVG-RPL   INTRAOPERATIVE TRANSESOPHAGEAL ECHOCARDIOGRAM N/A 10/23/2013   Procedure: INTRAOPERATIVE TRANSESOPHAGEAL ECHOCARDIOGRAM;  Surgeon: Rexene Alberts, MD;  Location: [place];  Service: Open Heart Surgery;  Laterality: N/A;   LEFT HEART CATHETERIZATION WITH CORONARY ANGIOGRAM N/A 10/20/2013   Procedure: LEFT HEART CATHETERIZATION WITH CORONARY ANGIOGRAM;  Surgeon: Leonie Man, MD;  Location: [facility] CATH LAB;  Service: Cardiovascular: Distal LM ~70%, LAD - ostial 70%, prox 80, 95 & 95%; RCA prox 70%, mid 80%; minimal Cx disease   LEFT HEART CATHETERIZATION WITH CORONARY/GRAFT ANGIOGRAM N/A 09/08/2014   Procedure: LEFT HEART CATHETERIZATION WITH Beatrix Fetters;  Surgeon: Leonie Man, MD;  Location: [facility] CATH LAB: Indication: Abnormal Myoview. Occluded LAD after 70 and 80% left main. Diffuse RCA disease. Widely patent grafts to Moderately diseased artery vessels. Mild-moderate LV dysfunction and chronic A. fib.   NM MYOVIEW LTD  08/14/2014   Lexi scan: INTERMEDIATE RISK - large, severe intensity perfusion defect in anterior wall, apex and inferior apex that is partly reversible. This suggests either scar or possible hibernating myocardium. Nondeviated.-> Likely scar. No change in coronary anatomy with patent grafts.   TOTAL HIP ARTHROPLASTY  2010, 2011   right 2010, left 2011   TRANSTHORACIC ECHOCARDIOGRAM  12/2013   Mildly dilated left ventricle with mild to moderately reduced function. EF 40-45% (notable improvement from  February 2015). Septal and apical hypokinesis. Mild LA dilation.   TRANSTHORACIC ECHOCARDIOGRAM  11/11/2019   EF 20% with severely dysfunction. Global HK. Unable to determine diastolic pressures. Mild RV function-with mildly elevated PAP-RAP estimated 15 mmHg.. Moderate LA dilation, mild RA dilation.    Immunization History  Administered Date(s) Administered   Influenza Whole 05/22/2018   Influenza, High Dose Seasonal PF 07/09/2017   PFIZER SARS-COV-2 Vaccination 10/03/2019, 10/24/2019, 06/26/2020    MEDICATIONS/ALLERGIES   Current Meds  Medication Sig   acetaminophen (TYLENOL) 650 MG CR tablet Take 1,300 mg by mouth 2 (two) times daily.   albuterol (VENTOLIN HFA) 108 (90 Base) MCG/ACT inhaler Inhale 2 puffs into the lungs every 6 (six) hours as needed for wheezing or shortness of breath.   apixaban (ELIQUIS) 5 MG TABS tablet Take 1 tablet (5 mg total) by mouth 2 (two) times daily. Resume Saturday evening.   Ascorbic Acid (VITAMIN C) 1000 MG tablet Take 2,000 mg by mouth daily.   Coenzyme Q10 (COQ-10) 100 MG CAPS Take 100 mg by mouth in the morning and at bedtime.    colchicine 0.6 MG tablet Take 0.6 mg by mouth daily as needed (Gout).    ezetimibe (ZETIA) 10 MG tablet Take 10 mg by mouth daily.   ferrous sulfate 325 (65 FE) MG tablet Take 325 mg by mouth every other day.  isosorbide-hydrALAZINE (BIDIL) 20-37.5 MG tablet Take 0.5 tablets by mouth in the morning, at noon, and at bedtime.   levothyroxine (SYNTHROID) 112 MCG tablet Take 1 tablet (112 mcg total) by mouth daily before breakfast.   Misc Natural Products (JOINT HEALTH PO) Take 30 mLs by mouth daily.   Multiple Vitamin (MULTIVITAMIN WITH MINERALS) TABS tablet Take 1 tablet by mouth daily.   Multiple Vitamins-Minerals (PRESERVISION AREDS 2) CAPS Take 1 capsule by mouth 2 (two) times daily.   potassium chloride SA (KLOR-CON) 20 MEQ tablet Take 1 tablet (20 mEq total) by mouth 2 (two) times daily.    rosuvastatin (CRESTOR) 5 MG tablet Take 5 mg by mouth every other day.    torsemide (DEMADEX) 20 MG tablet Take 5 tablets (100 mg total) by mouth in the morning AND 4 tablets (80 mg total) every evening.   umeclidinium-vilanterol (ANORO ELLIPTA) 62.5-25 MCG/INH AEPB Inhale 1 puff into the lungs daily.    Allergies  Allergen Reactions   Crestor [Rosuvastatin Calcium] Other (See Comments)    Cramps- still taking    Allopurinol Hives   Tape Rash    Adhesive tape    SOCIAL HISTORY/FAMILY HISTORY   Reviewed in Epic:  Pertinent findings:  Social History   Tobacco Use   Smoking status: Former Smoker    Packs/day: 0.50    Years: 39.00    Pack years: 19.50    Types: Cigarettes    Quit date: 08/28/1993    Years since quitting: 27.0   Smokeless tobacco: Never Used  Vaping Use   Vaping Use: Never used  Substance Use Topics   Alcohol use: No    Comment: quit 95   Drug use: No   Social History   Social History Narrative   Lives with husband and son in [place].  She is currently retired.   Former smoker quit in 1995.    OBJCTIVE -PE, EKG, labs   Wt Readings from Last 3 Encounters:  08/30/20 224 lb 14.4 oz (102 kg)  08/18/20 231 lb 4.8 oz (104.9 kg)  06/22/20 224 lb (101.6 kg)    Physical Exam: BP 108/64    Pulse 80    Wt 224 lb 14.4 oz (102 kg)    SpO2 100%    BMI 41.13 kg/m  Physical Exam Vitals reviewed.  Constitutional:      General: She is not in acute distress.    Appearance: Normal appearance. She is ill-appearing (Chronically ill but comfortable appearing.). She is not toxic-appearing.     Comments: Morbidly obese.  Well-groomed.  HENT:     Head: Normocephalic and atraumatic.  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 No carotid bruit.  Cardiovascular:     Rate and Rhythm: Normal rate and regular rhythm.  No extrasystoles are present.     Chest Wall: PMI is not displaced (Difficult to palpate due to body habitus).     Pulses: Decreased pulses (Unable to palpate pedal pulses because of edema).     Heart sounds: S1 normal. Heart sounds are distant. Murmur (Soft 1/6 SEM at RUSB) heard.  No friction rub. No gallop.      Comments: Faintly split S2 with normal S1 Pulmonary:     Effort: Pulmonary effort is normal. No respiratory distress.     Comments: Mild diffuse interstitial sounds, distant breath sounds but no obvious wheezes rales or  rhonchi. Chest:     Chest wall: No tenderness.  Musculoskeletal:        General: Swelling (3+ pitting edema bilateral legs to the knees-support stockings in place.) present.     Cervical back: Neck supple.     Comments: Decreased range of motion  Neurological:     General: No focal deficit present.     Mental Status: She is alert and oriented to person, place, and time.     Motor: Weakness present.     Gait: Gait (Wheelchair-bound, did not assess) normal.     Comments: Wheelchair-bound.  Has leg weakness as far as lifting her legs.  She does do dorsiflexion and plantarflexion without difficulty. Baseline mild fine tremor worse with intention  Psychiatric:        Mood and Affect: Mood normal.        Behavior: Behavior normal.        Thought Content: Thought content normal.        Judgment: Judgment normal.     Comments: New usual jovial state of mind.     Adult ECG Report Not checked  Recent Labs: Reviewed Lab Results  Component Value Date   CHOL 123 04/22/2020   HDL 65 04/22/2020   LDLCALC 46 04/22/2020   TRIG 60 04/22/2020   CHOLHDL 1.9 04/22/2020   Lab Results  Component Value Date   CREATININE 2.31 (H) 08/26/2020   CREATININE 2.41 (H) 08/18/2020   CREATININE 2.36 (H) 05/04/2020    Lab Results  Component Value Date   CREATININE 2.31 (H) 08/26/2020   BUN 28 (H) 08/26/2020   NA 137 08/26/2020   K 4.0 08/26/2020   CL 100 08/26/2020   CO2 28 08/26/2020   CBC Latest Ref  Rng & Units 05/11/2020 01/06/2020 11/25/2019  WBC 3.4 - 10.8 x10E3/uL 4.6 6.2 5.7  Hemoglobin 11.1 - 15.9 g/dL 11.5 12.0 12.3  Hematocrit 34.0 - 46.6 % 35.3 37.0 38.8  Platelets 150 - 450 x10E3/uL 238 194 216    Lab Results  Component Value Date   TSH 1.780 03/09/2020    ASSESSMENT/PLAN    Problem List Items Addressed This Visit    Permanent atrial fibrillation (HCC) - CHA2DS2-VASc Score 6, on Eliquis (Chronic)    Now status post AV node ablation with BiV ICD/CRT-D in place.  Ventricular paced.  No longer on amiodarone.  Remains on apixaban with no bleeding issues.      Left main coronary artery disease - Primary (Chronic)    Left main and multivessel disease referred for CABG.  Patent grafts and stable native disease on relook cath. No further angina.  Plan:  No longer on beta-blocker (ostensibly because of low blood pressure and now V paced) -> the last several CHF clinic notes him said that she is on 100 mg Toprol-but it is not listed on her medication list.  (Need to confirm)  Remains on rosuvastatin low-dose with excellent control of lipids.  She is on BiDil  Not on aspirin or Plavix.  On apixaban      History of acute anterior wall myocardial infarction (Chronic)    Initial presentation was anterior MI with acute heart failure in the setting of A. fib RVR.  She had a large defect on Myoview however in the inferior wall which is unusual.  She did have left main as well as LAD and RCA disease.  Is now status post CABG.  Follow-up catheter showed patent grafts.  Although her initial EF improved from  20-25 up to 40 and 45, now back down to 20%.    Has not had any further angina.      Ischemic cardiomyopathy (Chronic)   Morbid obesity- BMI 48 (Chronic)    Clearly this impacts her leg weakness and fatigue as well as some dyspnea.  However her lack of mobility means that she is not to be on exercise.  She has changed her diet, but a lot of her weight is actually edema.       Chronic kidney disease, stage 3b (HCC) (Chronic)    Limits ability to use Entresto.  Converted to BiDil. Not on spironolactone as well because of renal insufficiency and hyperkalemia.  He is not on SGLT2 inhibitor which could potentially be readdressed -> nephrology is using in CKD patients to help renal perfusion.[name]      CAD status post CABG (Chronic)    Last ischemic evaluation was back in 2016.  She has had a reduced EF which was probably related to A. fib and a pulmonary block given the lack of any active symptoms.  I do not think a stress test would be all that helpful because of her reduced EF and septal pacing.  If she has worsening heart failure symptoms that are concerning and difficult to manage, would probably consider right left heart cath, but for now would hold off.  On low-dose statin and BiDil.  (Still unsure if she is on Toprol).      Chronic combined systolic and diastolic CHF, NYHA class 2 (HCC) (Chronic)    Hard to tell what her symptom status is because she is so sedentary.  At least class II if not class III with exception of IVUS she is not requiring hospitalization.  Recent exacerbation now weight seems to be down with increased dose of diuretic.  Plan:   Continue BiDil one half tab 3 times daily due to borderline pressures (no Entresto due to CKD 3B)  Unable to use digoxin or spironolactone -> CKD 3B  ?  Reconsider SGLT2 inhibitor-may potentially provide renal benefit  Continue current dose of torsemide 100 mg in the morning and 80 in the evening with sliding scale.  (Daily weights)  Follow-up with Dr. Aundra Dubin next week--we will defer management to answer failure team.      Hyperlipidemia with target LDL less than 70 (Chronic)    Remains on low-dose rosuvastatin with excellent lipid control.      Essential hypertension (Chronic)    On low-dose BiDil.  (Need to confirm whether or not she is on Toprol or not-is listed on K PN, but not on med list.       Left bundle branch block (LBBB) on electrocardiogram (Chronic)    Question if her drop in EF is related to left bundle branch block with A. fib that was prominent/persistent. Is now status post AV node ablation with CRT-P-> BiV pacing.  Has not had any assessment since AV node ablation.      Edema of both legs (Chronic)    Part of her anasarca swelling is probably related to venous stasis since she does not have a lot of PND orthopnea.  There may be some right-sided heart failure as well.  Is on high-dose diuretic, but not able to tolerate addition of spironolactone because of CKD BUN hyperkalemia.  Support stockings and I stressed the importance of foot elevation.  (Consider home sequential compression devices)      Physical deconditioning    Very deconditioned.  We  will defer to the advanced heart failure team or PCP, but again I think she would benefit from home health assistance both of physical therapy, occupational therapy and general nursing care -> helping to check blood pressures volume status etc.          COVID-19 Education: The signs and symptoms of COVID-19 were discussed with the patient and how to seek care for testing (follow up with PCP or arrange E-visit).   The importance of social distancing and COVID-19 vaccination was discussed today.  The patient is practicing social distancing & Masking.   I spent a total of 88minutes with the patient spent in direct patient consultation.  Additional time spent with chart review  / charting (studies, outside notes, etc): 66min Total Time: 41 min   Current medicines are reviewed at length with the patient today.  (+/- concerns) n/a  This visit occurred during the SARS-CoV-2 public health emergency.  Safety protocols were in place, including screening questions prior to the visit, additional usage of staff PPE, and extensive cleaning of exam room while observing appropriate contact time as indicated for disinfecting  solutions.  Notice: This dictation was prepared with Dragon dictation along with smaller phrase technology. Any transcriptional errors that result from this process are unintentional and may not be corrected upon review.  Patient Instructions / Medication Changes & Studies & Tests Ordered   Patient Instructions  Medication Instructions:  No changes  *If you need a refill on your cardiac medications before your next appointment, please call your pharmacy*   Lab Work: Not needed    Testing/Procedures:  Not needed  Follow-Up: At [facility], you and your health needs are our priority.  As part of our continuing mission to provide you with exceptional heart care, we have created designated Provider Care Teams.  These Care Teams include your primary Cardiologist (physician) and Advanced Practice Providers (APPs -  Physician Assistants and Nurse Practitioners) who all work together to provide you with the care you need, when you need it.     Your next appointment:   6 month(s) July 2022    The format for your next appointment:   In Person  Provider:   Glenetta Hew, MD   Other Instructions  If you have an appointment with Heart Failure  Clinic in July as well you do not needed to see Dr Kahliya Fraleigh in the same month you can cancel     Studies Ordered:   No orders of the defined types were placed in this encounter.    Glenetta Hew, M.D., M.S. Interventional Cardiologist   Pager # [phone] Phone # [phone] [address] [place], [place] [address]   Thank you for choosing Heartcare at [facility]!!

## 2020-08-30 NOTE — Patient Instructions (Signed)
Medication Instructions:  No changes  *If you need a refill on your cardiac medications before your next appointment, please call your pharmacy*   Lab Work: Not needed    Testing/Procedures:  Not needed  Follow-Up: At [facility], you and your health needs are our priority.  As part of our continuing mission to provide you with exceptional heart care, we have created designated Provider Care Teams.  These Care Teams include your primary Cardiologist (physician) and Advanced Practice Providers (APPs -  Physician Assistants and Nurse Practitioners) who all work together to provide you with the care you need, when you need it.     Your next appointment:   6 month(s) July 2022    The format for your next appointment:   In Person  Provider:   Glenetta Hew, MD   Other Instructions  If you have an appointment with Heart Failure  Clinic in July as well you do not needed to see Dr harding in the same month you can cancel

## 2020-08-31 ENCOUNTER — Other Ambulatory Visit: Payer: Self-pay | Admitting: Internal Medicine

## 2020-08-31 DIAGNOSIS — Z1231 Encounter for screening mammogram for malignant neoplasm of breast: Secondary | ICD-10-CM

## 2020-09-02 ENCOUNTER — Encounter: Payer: Self-pay | Admitting: Cardiology

## 2020-09-02 NOTE — Assessment & Plan Note (Signed)
Question if her drop in EF is related to left bundle branch block with A. fib that was prominent/persistent. Is now status post AV node ablation with CRT-P-> BiV pacing.  Has not had any assessment since AV node ablation.

## 2020-09-02 NOTE — Assessment & Plan Note (Signed)
Initial presentation was anterior MI with acute heart failure in the setting of A. fib RVR.  She had a large defect on Myoview however in the inferior wall which is unusual.  She did have left main as well as LAD and RCA disease.  Is now status post CABG.  Follow-up catheter showed patent grafts.  Although her initial EF improved from 20-25 up to 40 and 45, now back down to 20%.    Has not had any further angina.

## 2020-09-02 NOTE — Assessment & Plan Note (Signed)
Limits ability to use Entresto.  Converted to BiDil. Not on spironolactone as well because of renal insufficiency and hyperkalemia.  He is not on SGLT2 inhibitor which could potentially be readdressed -> nephrology is using in CKD patients to help renal perfusion.[name]

## 2020-09-02 NOTE — Assessment & Plan Note (Signed)
Last ischemic evaluation was back in 2016.  She has had a reduced EF which was probably related to A. fib and a pulmonary block given the lack of any active symptoms.  I do not think a stress test would be all that helpful because of her reduced EF and septal pacing.  If she has worsening heart failure symptoms that are concerning and difficult to manage, would probably consider right left heart cath, but for now would hold off.  On low-dose statin and BiDil.  (Still unsure if she is on Toprol).

## 2020-09-02 NOTE — Assessment & Plan Note (Addendum)
Hard to tell what her symptom status is because she is so sedentary.  At least class II if not class III with exception of IVUS she is not requiring hospitalization.  Recent exacerbation now weight seems to be down with increased dose of diuretic.  Plan:   Continue BiDil one half tab 3 times daily due to borderline pressures (no Entresto due to CKD 3B)  Unable to use digoxin or spironolactone -> CKD 3B  ?  Reconsider SGLT2 inhibitor-may potentially provide renal benefit  Continue current dose of torsemide 100 mg in the morning and 80 in the evening with sliding scale.  (Daily weights)  Follow-up with Dr. Aundra Dubin next week--we will defer management to answer failure team.

## 2020-09-02 NOTE — Assessment & Plan Note (Signed)
Clearly this impacts her leg weakness and fatigue as well as some dyspnea.  However her lack of mobility means that she is not to be on exercise.  She has changed her diet, but a lot of her weight is actually edema.

## 2020-09-02 NOTE — Assessment & Plan Note (Signed)
On low-dose BiDil.  (Need to confirm whether or not she is on Toprol or not-is listed on K PN, but not on med list.

## 2020-09-02 NOTE — Assessment & Plan Note (Signed)
Part of her anasarca swelling is probably related to venous stasis since she does not have a lot of PND orthopnea.  There may be some right-sided heart failure as well.  Is on high-dose diuretic, but not able to tolerate addition of spironolactone because of CKD BUN hyperkalemia.  Support stockings and I stressed the importance of foot elevation.  (Consider home sequential compression devices)

## 2020-09-02 NOTE — Assessment & Plan Note (Signed)
Very deconditioned.  We will defer to the advanced heart failure team or PCP, but again I think she would benefit from home health assistance both of physical therapy, occupational therapy and general nursing care -> helping to check blood pressures volume status etc.

## 2020-09-02 NOTE — Assessment & Plan Note (Signed)
Remains on low-dose rosuvastatin with excellent lipid control.

## 2020-09-02 NOTE — Assessment & Plan Note (Signed)
Now status post AV node ablation with BiV ICD/CRT-D in place.  Ventricular paced.  No longer on amiodarone.  Remains on apixaban with no bleeding issues.

## 2020-09-02 NOTE — Assessment & Plan Note (Signed)
Left main and multivessel disease referred for CABG.  Patent grafts and stable native disease on relook cath. No further angina.  Plan:  No longer on beta-blocker (ostensibly because of low blood pressure and now V paced) -> the last several CHF clinic notes him said that she is on 100 mg Toprol-but it is not listed on her medication list.  (Need to confirm)  Remains on rosuvastatin low-dose with excellent control of lipids.  She is on BiDil  Not on aspirin or Plavix.  On apixaban

## 2020-09-03 ENCOUNTER — Telehealth (HOSPITAL_COMMUNITY): Payer: Self-pay | Admitting: Cardiology

## 2020-09-03 MED ORDER — METOPROLOL SUCCINATE ER 100 MG PO TB24: 100.0000 mg | ORAL_TABLET | Freq: Every day | ORAL | 3 refills | Status: DC

## 2020-09-03 NOTE — Telephone Encounter (Signed)
-----   Message from Larey Dresser, MD sent at 09/02/2020  9:22 AM EST ----- We'll need to find out whether or not she is taking the Toprol XL.  Will see if my office can call Nira Conn, Philicia could one of you check with her).   Her last GFR estimation was 21 and not supposed to use [place] if below 25.  However, Jardiance can be used down to 20.   ----- Message ----- From: Leonie Man, MD Sent: 09/02/2020   2:06 AM EST To: Larey Dresser, MD  Dalton, you are seeing [name] next week I believe.  A lot of the notes indicate in the assessment plan as she is on her milligrams Toprol, however it is not listed on her med list.  I realize this when I was dictating a note.  We need to confirm whether or not she is on or not.  She seems appropriate good last saw her.  Her weight was back down to what it was in October.  Also was thinking about doing really need to stop SGLT2 inhibitor because renal insufficiency since nephrologist actually using it for renal insufficiency?  Thanks for taking care of her.  I will let you guys manage the heart failure.  I am just keep an eye on her from CAD standpoint.  Glenetta Hew, MD

## 2020-09-03 NOTE — Telephone Encounter (Signed)
Spoke with patient and she reports she is taking metoprolol Er 100 mg one tab daily Med list updated and advised to bring ALL medication bottles to upcoming appt

## 2020-09-06 ENCOUNTER — Ambulatory Visit: Payer: Self-pay

## 2020-09-06 ENCOUNTER — Encounter: Payer: Self-pay | Admitting: Physician Assistant

## 2020-09-06 ENCOUNTER — Ambulatory Visit (INDEPENDENT_AMBULATORY_CARE_PROVIDER_SITE_OTHER): Payer: Medicare Other | Admitting: Physician Assistant

## 2020-09-06 DIAGNOSIS — M79642 Pain in left hand: Secondary | ICD-10-CM

## 2020-09-06 DIAGNOSIS — M25532 Pain in left wrist: Secondary | ICD-10-CM

## 2020-09-06 DIAGNOSIS — M79641 Pain in right hand: Secondary | ICD-10-CM | POA: Diagnosis not present

## 2020-09-06 DIAGNOSIS — M25531 Pain in right wrist: Secondary | ICD-10-CM

## 2020-09-06 NOTE — Progress Notes (Signed)
Wound Care Note   Patient: Elaine [name]           Date of Birth: 1940/09/02           MRN: [id number]             PCP: Marton Redwood, MD Visit Date: 09/06/2020   Assessment & Plan: Visit Diagnoses:  1. Pain in left hand   2. Pain in right hand   3. Pain in left wrist   4. Pain in right wrist     Plan:  We will obtain EMG nerve conduction studies of the upper extremities to rule out carpal tunnel syndrome as source of her numbness tingling in both hands.  Have her follow-up after the EMG nerve conduction studies to discuss results and further treatment.  Questions encouraged and answered at length.  Follow-Up Instructions: No follow-ups on file.  Orders:  Orders Placed This Encounter  Procedures  . XR Hand Complete Left  . XR Hand Complete Right  . XR Wrist 2 Views Left  . XR Wrist 2 Views Right   No orders of the defined types were placed in this encounter.     Procedures:     Clinical Data: No additional findings.   No images are attached to the encounter.   Subjective: Chief Complaint  Patient presents with  . Left Wrist - Pain  . Right Wrist - Pain  . Right Hand - Pain  . Left Hand - Pain    HPI Elaine [name] 80 year old female well-known by department service comes in today with bilateral hand pain.  States the pain has been ongoing since a fall in October.  She having numbness pain in both hands.  Notes she drops things when she tries to grasp them.  Pain starts in her wrist and goes down into her fingers.  She notes numbness and tingling in the right hand second through fourth fingers of the left hand second and third finger.  She is diabetic with reported good control.  Review of Systems Negative for fevers chills shortness of breath.  Objective: Vital Signs: There were no vitals taken for this visit.  Physical Exam:  General: Well-developed well-nourished female no acute distress. Bilateral hands: Radial pulses are intact.  She has diminished  sensation on the right hand palm and thumb index and long finger.  Left hand she has diminished sensation in the second and third fingers.  Positive Tinel's over the wrist.  Negative grind test of the thumb bilaterally.  She has tenderness at the [facility] joint of the left thumb only with palpation.   Specialty Comments: No specialty comments available.   PMFS History: Patient Active Problem List   Diagnosis Date Noted  . Chronic systolic (congestive) heart failure ([place]) 07/29/2019  . Hypothyroidism 06/16/2019  . Acute on chronic combined systolic and diastolic CHF (congestive heart failure) ([place]) 06/11/2019  . Bilateral leg pain 10/22/2017  . Chronic combined systolic and diastolic CHF, NYHA class 2 ([place]) 10/22/2017  . Edema of both legs 05/10/2015  . Mass of right breast on mammogram 10/06/2014  . DOE (dyspnea on exertion) 08/19/2014  . Severe obesity (BMI >= 40) ([place]) 04/19/2014  . CAD status post CABG 04/19/2014  . Metabolic acidosis 40/98/1191    Class: Acute  . Acute on chronic renal insufficiency 02/02/2014  . Near syncope 02/02/2014  . Hypotension 02/02/2014  . Rash 02/02/2014  . Chronic kidney disease, stage 3b ([place]) 02/02/2014    Class: Acute  .  Pruritus 02/02/2014    Class: Chronic  . SOB (shortness of breath) 01/07/2014  . Coumadin Rx post CABG 11/17/2013  . Physical deconditioning 11/05/2013  . S/P CABG x 3- 10/23/13 10/23/2013  . Permanent atrial fibrillation (HCC) - CHA2DS2-VASc Score 6, on Eliquis 10/20/2013  . Left bundle branch block (LBBB) on electrocardiogram 10/20/2013  . Left main coronary artery disease 10/20/2013    Class: Hospitalized for  . History of acute anterior wall myocardial infarction 10/20/2013  . Ischemic cardiomyopathy 10/20/2013  . Morbid obesity- BMI 48   . Diabetic neuropathy ([place])   . Acute kidney injury superimposed on chronic kidney disease ([place])   . Post corneal transplant 08/05/2012  . Obesity 12/20/2006  . Type II diabetes mellitus  with complication ([place]) 95/28/4132  . Hyperlipidemia with target LDL less than 70 08/06/2006  . Essential hypertension 08/06/2006  . OSTEOARTHRITIS 08/06/2006   Past Medical History:  Diagnosis Date  . Acute myocardial infarction of anterior wall (HCC) 10/20/2013   Afib, LBBB  . Acute pulmonary edema with congestive heart failure ([place]) 10/20/2013   Resolved  . Arthritis   . Atrial fibrillation ([place]) 10/20/2013   On Warfarin  . CAD, multiple vessel 10/20/2013   LM, LAD & RCA --> CABG; MYOVIEW 12/'15: Intermediate Risk would large anterior, anteroapical segment the apex possible infarct and peri-infarct ischemia  . CHF (congestive heart failure) (HCC)    EF 40-45%.  . Chronic kidney disease   . COPD (chronic obstructive pulmonary disease) ([place])   . Diabetic neuropathy ([place])   . Heart murmur    Likely related to aortic sclerosis  . Hyperlipidemia   . Hypertension   . Hypothyroidism   . Ischemic cardiomyopathy - Notable Improvement in EF post CABG 10/20/2013   Echo 2/23: EF 20-25%; mild LVH. anteroseptal Akinesis; mid-apical anterior, inferior & inferoseptal + apical-lateral akinesis; G 1 DDysfxn.; Mild-Mod LA dil; mild MR; Mod PHTN;; F/u Echo 12/2013: EF 40-45%, septal & apical HK, mild LVH; Mod LA dilation  . Left bundle branch block (LBBB) on electrocardiogram   . Mild mitral regurgitation by prior echocardiogram    Mild-Mod MR on Echo  . Morbid obesity ([place])   . OSTEOARTHRITIS 08/06/2006  . S/P CABG x 3 10/23/2013   LIMA to LAD, SVG to OM2, SVG to RPLB, EVH via right thigh  . Type II diabetes mellitus with complication ([place]) 44/08/270   off rx since heart attack 12/15-dr told could stay off if keep cbg under 150  . UTI (lower urinary tract infection) 09/29/14   ongoing now. started rx 09/28/14    Family History  Adopted: Yes  Problem Relation Age of Onset  . Other Mother   . Hypertension Mother   . Colon cancer Neg Hx   . Esophageal cancer Neg Hx   . Rectal cancer Neg Hx   .  Stomach cancer Neg Hx    Past Surgical History:  Procedure Laterality Date  . ABDOMINAL HYSTERECTOMY    . AV NODE ABLATION N/A 05/19/2020   Procedure: AV NODE ABLATION;  Surgeon: Constance Haw, MD;  Location: [place] CV LAB;  Service: Cardiovascular;  Laterality: N/A;  . BIV ICD INSERTION CRT-D N/A 07/28/2019   Procedure: BIV ICD INSERTION CRT-D;  Surgeon: Constance Haw, MD;  Location: [place] CV [facility];  Service: Cardiovascular;  Laterality: N/A;  . BREAST LUMPECTOMY W/ NEEDLE LOCALIZATION Right 10/06/2014   Dr Georgette Dover  . BREAST LUMPECTOMY WITH NEEDLE LOCALIZATION Right 10/06/2014   Procedure: RIGHT BREAST  LUMPECTOMY WITH NEEDLE LOCALIZATION;  Surgeon: Donnie Mesa, MD;  Location: [place];  Service: General;  Laterality: Right;  . CARDIOVERSION N/A 10/22/2019   Procedure: CARDIOVERSION;  Surgeon: Larey Dresser, MD;  Location: [facility] ENDOSCOPY;  Service: Cardiovascular;  Laterality: N/A;  . CLIPPING OF ATRIAL APPENDAGE N/A 10/23/2013   Procedure: CLIPPING OF ATRIAL APPENDAGE;  Surgeon: Rexene Alberts, MD;  Location: [place];  Service: Open Heart Surgery;  Laterality: N/A;  . 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  . CORONARY ARTERY BYPASS GRAFT N/A 10/23/2013   Procedure: CORONARY ARTERY BYPASS GRAFTING (CABG);  Surgeon: Rexene Alberts, MD;  Location: [place];  Service: Open Heart Surgery: LIMA-LAD, SVG-OM2, SVG-RPL  . INTRAOPERATIVE TRANSESOPHAGEAL ECHOCARDIOGRAM N/A 10/23/2013   Procedure: INTRAOPERATIVE TRANSESOPHAGEAL ECHOCARDIOGRAM;  Surgeon: Rexene Alberts, MD;  Location: [place];  Service: Open Heart Surgery;  Laterality: N/A;  . LEFT HEART CATHETERIZATION WITH CORONARY ANGIOGRAM N/A 10/20/2013   Procedure: LEFT HEART CATHETERIZATION WITH CORONARY ANGIOGRAM;  Surgeon: Leonie Man, MD;  Location: [facility] CATH LAB;  Service: Cardiovascular: Distal LM ~70%, LAD - ostial 70%, prox 80, 95 & 95%; RCA prox 70%, mid 80%; minimal Cx disease  . LEFT HEART CATHETERIZATION WITH CORONARY/GRAFT  ANGIOGRAM N/A 09/08/2014   Procedure: LEFT HEART CATHETERIZATION WITH Beatrix Fetters;  Surgeon: Leonie Man, MD;  Location: [facility] CATH LAB: Indication: Abnormal Myoview. Occluded LAD after 70 and 80% left main. Diffuse RCA disease. Widely patent grafts to Moderately diseased artery vessels. Mild-moderate LV dysfunction and chronic A. fib.  [name] NM MYOVIEW LTD  08/14/2014   Lexi scan: INTERMEDIATE RISK - large, severe intensity perfusion defect in anterior wall, apex and inferior apex that is partly reversible. This suggests either scar or possible hibernating myocardium. Nondeviated.-> Likely scar. No change in coronary anatomy with patent grafts.  [name] TOTAL HIP ARTHROPLASTY  2010, 2011   right 2010, left 2011  . TRANSTHORACIC ECHOCARDIOGRAM  12/2013   Mildly dilated left ventricle with mild to moderately reduced function. EF 40-45% (notable improvement from February 2015). Septal and apical hypokinesis. Mild LA dilation.  . TRANSTHORACIC ECHOCARDIOGRAM  11/11/2019   EF 20% with severely dysfunction. Global HK. Unable to determine diastolic pressures. Mild RV function-with mildly elevated PAP-RAP estimated 15 mmHg.. Moderate LA dilation, mild RA dilation.   Social History   Occupational History  . Occupation: retired  Tobacco Use  . Smoking status: Former Smoker    Packs/day: 0.50    Years: 39.00    Pack years: 19.50    Types: Cigarettes    Quit date: 08/28/1993    Years since quitting: 27.0  . Smokeless tobacco: Never Used  Vaping Use  . Vaping Use: Never used  Substance and Sexual Activity  . Alcohol use: No    Comment: quit 95  . Drug use: No  . Sexual activity: Not on file

## 2020-09-06 NOTE — Addendum Note (Signed)
Addended by: Robyne Peers on: 09/06/2020 02:49 PM   Modules accepted: Orders

## 2020-09-08 ENCOUNTER — Ambulatory Visit (HOSPITAL_COMMUNITY)
Admission: RE | Admit: 2020-09-08 | Discharge: 2020-09-08 | Disposition: A | Discharge status: Home / Self Care | Payer: Medicare Other | Source: Ambulatory Visit | Attending: Cardiology | Admitting: Cardiology

## 2020-09-08 ENCOUNTER — Ambulatory Visit (INDEPENDENT_AMBULATORY_CARE_PROVIDER_SITE_OTHER): Payer: Medicare Other

## 2020-09-08 ENCOUNTER — Other Ambulatory Visit: Payer: Self-pay

## 2020-09-08 ENCOUNTER — Encounter (HOSPITAL_COMMUNITY): Payer: Self-pay | Admitting: Cardiology

## 2020-09-08 VITALS — BP 130/60 | HR 81 | Wt 219.0 lb

## 2020-09-08 DIAGNOSIS — E785 Hyperlipidemia, unspecified: Secondary | ICD-10-CM | POA: Diagnosis not present

## 2020-09-08 DIAGNOSIS — I5042 Chronic combined systolic (congestive) and diastolic (congestive) heart failure: Secondary | ICD-10-CM | POA: Diagnosis present

## 2020-09-08 DIAGNOSIS — I255 Ischemic cardiomyopathy: Secondary | ICD-10-CM | POA: Diagnosis not present

## 2020-09-08 DIAGNOSIS — I5022 Chronic systolic (congestive) heart failure: Secondary | ICD-10-CM | POA: Diagnosis not present

## 2020-09-08 LAB — LIPID PANEL
Cholesterol: 126 mg/dL (ref 0–200)
HDL: 53 mg/dL (ref >40)
LDL Cholesterol: 59 mg/dL (ref 0–99)
Total CHOL/HDL Ratio: 2.4 RATIO
Triglycerides: 68 mg/dL (ref <150)
VLDL: 14 mg/dL (ref 0–40)

## 2020-09-08 LAB — CBC
HCT: 36.7 % (ref 36.0–46.0)
Hemoglobin: 11.5 g/dL — ABNORMAL LOW (ref 12.0–15.0)
MCH: 28.3 pg (ref 26.0–34.0)
MCHC: 31.3 g/dL (ref 30.0–36.0)
MCV: 90.2 fL (ref 80.0–100.0)
Platelets: 234 10*3/uL (ref 150–400)
RBC: 4.07 MIL/uL (ref 3.87–5.11)
RDW: 16.2 % — ABNORMAL HIGH (ref 11.5–15.5)
WBC: 5.8 10*3/uL (ref 4.0–10.5)
nRBC: 0 % (ref 0.0–0.2)

## 2020-09-08 LAB — BASIC METABOLIC PANEL
Anion gap: 13 (ref 5–15)
BUN: 38 mg/dL — ABNORMAL HIGH (ref 8–23)
CO2: 27 mmol/L (ref 22–32)
Calcium: 9.6 mg/dL (ref 8.9–10.3)
Chloride: 102 mmol/L (ref 98–111)
Creatinine, Ser: 2.28 mg/dL — ABNORMAL HIGH (ref 0.44–1.00)
GFR, Estimated: 21 mL/min — ABNORMAL LOW (ref >60)
Glucose, Bld: 150 mg/dL — ABNORMAL HIGH (ref 70–99)
Potassium: 4.2 mmol/L (ref 3.5–5.1)
Sodium: 142 mmol/L (ref 135–145)

## 2020-09-08 MED ORDER — EMPAGLIFLOZIN 10 MG PO TABS: 10.0000 mg | ORAL_TABLET | Freq: Every day | ORAL | 2 refills | Status: DC

## 2020-09-08 NOTE — Progress Notes (Signed)
Heart Failure TeleHealth Note  Due to national recommendations of social distancing due to [place] 19, Audio/video telehealth visit is felt to be most appropriate for this patient at this time.  See MyChart message from today for patient consent regarding telehealth for [name].  Date:  09/08/2020   ID:  Elaine [name], DOB April 06, 1941, MRN [id number]  Location: Home  Provider location: [place] [facility] Type of Visit: Established patient  PCP:  Marton Redwood, MD  Cardiologist:  Glenetta Hew, MD Primary HF: Dr. Aundra Dubin   History of Present Illness: Elaine [name] is a 80 y.o. female who presents via audio/video conferencing for a telehealth visit today.     she denies symptoms worrisome for COVID 19.   Patient has history of CAD s/p CABG, COPD, CKD stage 3, and chronic systolic systolic CHF/ischemic cardiomyopathy.  She was referred by Dr. Ellyn Hack for evaluation of CHF.  Patient was admitted in 2/15 with atrial fibrillation/RVR and chest pain.  Cath showed severe 3VD and she had CABG x 3.  Echo in 2/15 showed EF down to 20-25%. In 2018, it appears that she went back into atrial fibrillation and has remained in atrial fibrillation persistently.  DCCV was not attempted.   Most recent echo in 10/20 showed EF < 20% with normal RV.  She had a St Jude CRT-D device implanted in 11/20.  She says that she feels "90%" better with CRT.  She is followed by Dr. Hollie Salk with nephrology.   She has been in atrial fibrillation persistently, I attempted DCCV while on amiodarone but she did not convert.  Therefore, I stopped amiodarone.    Echo in 3/21 showed EF 20% with diffuse hypokinesis, mildly dilated RV with mildly decreased systolic function, PASP 49 mmHg, dilated IVC.  In 9/21, she had AV nodal ablation.   She returns now for followup of CHF.  Weight is down 12 lbs.  She is watching sodium and fluid intake.  Breathing/stamina have been improved since AV nodal ablation.  No dyspnea walking  in her house.  Mild dyspnea walking out to her car.  No orthopnea/PND.  No chest pain.  No lightheadedness.    ECG (personally reviewed): atrial fibrillation, LBBB with occasional BiV pacing (personally reviewed).  Labs (2/21): K 4.5, creatinine 2.08 => 2.99, hgb 12.8 Labs (7/21): K 4, creatinine 2.3, TSH normal Labs (12/21): K 4, creatinine 2.31, BNP 671  St Jude device interrogation: Stable thoracic impedance.   PMH: 1. CAD: s/p CABG in 2/15 with LIMA-LAD, SVG-OM2, SVG-PLV.  2. LBBB: Chronic.  3. Atrial fibrillation: Persistent since 2018.  - LA appendage clip with CBGdldkl 4. H/o aspiration PNA 5. Type 2 diabetes 6. COPD 7. CKD stage 3 8. Chronic systolic CHF: Ischemic cardiomyopathy.  She has a St Jude BiV ICD.  - Echo (2/15): EF 20-25%.  - Echo (5/15): EF 40-45% - Echo (10/20): EF <20%, mild LVH, normal RV size and systolic function.  - Echo (3/21): EF 20% with diffuse hypokinesis, mildly dilated RV with mildly decreased systolic function, PASP 49 mmHg, dilated IVC.  - AV nodal ablation 9. CKD: Stage 3.   Social History   Socioeconomic History  . Marital status: Widowed    Spouse name: Not on file  . Number of children: Not on file  . Years of education: Not on file  . Highest education level: Not on file  Occupational History  . Occupation: retired  Tobacco Use  . Smoking status: Former Smoker  Packs/day: 0.50    Years: 39.00    Pack years: 19.50    Types: Cigarettes    Quit date: 08/28/1993    Years since quitting: 27.0  . Smokeless tobacco: Never Used  Vaping Use  . Vaping Use: Never used  Substance and Sexual Activity  . Alcohol use: No    Comment: quit 95  . Drug use: No  . Sexual activity: Not on file  Other Topics Concern  . Not on file  Social History Narrative   Lives with husband and son in [place].  She is currently retired.   Former smoker quit in 1995.   Social Determinants of Health   Financial Resource Strain: Medium Risk  .  Difficulty of Paying Living Expenses: Somewhat hard  Food Insecurity: No Food Insecurity  . Worried About Charity fundraiser in the Last Year: Never true  . Ran Out of Food in the Last Year: Never true  Transportation Needs: No Transportation Needs  . Lack of Transportation (Medical): No  . Lack of Transportation (Non-Medical): No  Physical Activity: Not on file  Stress: Not on file  Social Connections: Not on file  Intimate Partner Violence: Not on file   Family History  Adopted: Yes  Problem Relation Age of Onset  . Other Mother   . Hypertension Mother   . Colon cancer Neg Hx   . Esophageal cancer Neg Hx   . Rectal cancer Neg Hx   . Stomach cancer Neg Hx    Current Outpatient Medications  Medication Sig Dispense Refill  . acetaminophen (TYLENOL) 650 MG CR tablet Take 1,300 mg by mouth 2 (two) times daily.    [name] albuterol (VENTOLIN HFA) 108 (90 Base) MCG/ACT inhaler Inhale 2 puffs into the lungs every 6 (six) hours as needed for wheezing or shortness of breath.    [name] apixaban (ELIQUIS) 5 MG TABS tablet Take 1 tablet (5 mg total) by mouth 2 (two) times daily. Resume Saturday evening. 60 tablet   . Ascorbic Acid (VITAMIN C) 1000 MG tablet Take 2,000 mg by mouth daily.    . Coenzyme Q10 (COQ-10) 100 MG CAPS Take 100 mg by mouth in the morning and at bedtime.     . colchicine 0.6 MG tablet Take 0.6 mg by mouth daily as needed (Gout).     [name] empagliflozin (JARDIANCE) 10 MG TABS tablet Take 1 tablet (10 mg total) by mouth daily before breakfast. 90 tablet 2  . ezetimibe (ZETIA) 10 MG tablet Take 10 mg by mouth daily.    . ferrous sulfate 325 (65 FE) MG tablet Take 325 mg by mouth every other day.    . isosorbide-hydrALAZINE (BIDIL) 20-37.5 MG tablet Take 0.5 tablets by mouth in the morning, at noon, and at bedtime.    [name] levothyroxine (SYNTHROID) 112 MCG tablet Take 1 tablet (112 mcg total) by mouth daily before breakfast. 30 tablet 12  . metoprolol succinate (TOPROL-XL) 100 MG 24 hr  tablet Take 1 tablet (100 mg total) by mouth daily. Take with or immediately following a meal. 90 tablet 3  . Multiple Vitamin (MULTIVITAMIN WITH MINERALS) TABS tablet Take 1 tablet by mouth daily.    . Multiple Vitamins-Minerals (PRESERVISION AREDS 2) CAPS Take 1 capsule by mouth 2 (two) times daily.    . pantoprazole (PROTONIX) 40 MG tablet Take 40 mg by mouth daily.    . potassium chloride SA (KLOR-CON) 20 MEQ tablet Take 1 tablet (20 mEq total) by mouth 2 (two) times  daily. 30 tablet 11  . rosuvastatin (CRESTOR) 5 MG tablet Take 5 mg by mouth every other day.     . torsemide (DEMADEX) 20 MG tablet Take 5 tablets (100 mg total) by mouth in the morning AND 4 tablets (80 mg total) every evening. 270 tablet 3  . umeclidinium-vilanterol (ANORO ELLIPTA) 62.5-25 MCG/INH AEPB Inhale 1 puff into the lungs daily.     No current facility-administered medications for this encounter.   BP 130/60   Pulse 81   Wt 99.3 kg (219 lb)   SpO2 96%   BMI 40.06 kg/m  Exam:  (Video/Tele Health Call; Exam is subjective and or/visual.) General:  Speaks in full sentences. No resp difficulty. Neck: No JVD Lungs: Normal respiratory effort with conversation.  Abdomen: Non-distended per patient report Extremities: 1+ ankle edema.  Neuro: Alert & oriented x 3.   Assessment/Plan: 1. Chronic systolic CHF: Ischemic cardiomyopathy.  St Jude CRT-D device, now s/p AV nodal ablation.  Echo in 3/21 showed EF 20% with diffuse hypokinesis, mildly dilated RV with mildly decreased systolic function, PASP 49 mmHg, dilated IVC.  NYHA class II-III symptoms, better since AVN ablation.  She does not look volume overloaded on exam.  Meds limited by CKD stage IV.  - Continue torsemide 100 qam/80 bpm. BMET today.  - I will have her start empagliflozin 10 mg daily (beneficial for CHF and CKD).   - She was unable to tolerate Bidil 1 tab tid, but she does fine with Bidil 1/2 tab tid so will continue.  - Continue Toprol XL 100 mg daily.   - Off spironolactone with side effects.  2. CAD: S/p CABG in 2015. No chest pain.  - No ASA given apixaban use.  - Continue Crestor 5 daily + Zetia, check lipids.  3. Atrial fibrillation: Chronic now, looks like it has gone on since 2018.  I was unable to cardiovert her on amiodarone so it was stopped.  I do not think that I will be able to get her out of atrial fibrillation. She is now s/p AVN ablation to allow more BiV pacing.  - Continue apixaban. CBC today.  4. CKD: Stage 3.  Needs BMET today. 4  COVID screen The patient does not have any symptoms that suggest any further testing/ screening at this time.  Social distancing reinforced today.  Patient Risk: After full review of this patients clinical status, I feel that they are at moderate risk for cardiac decompensation at this time.  Relevant cardiac medications were reviewed at length with the patient today. The patient does not have concerns regarding their medications at this time.   Recommended follow-up:  Followup 2 months.   Today, I have spent 18 minutes with the patient with telehealth technology discussing the above issues .    Signed, Loralie Champagne, MD  09/08/2020  [facility] [address] [facility] and [place] [address] [phone] (office) [phone] (fax)

## 2020-09-08 NOTE — Patient Instructions (Signed)
Start Jardiance 10 mg Daily  Your provider has prescribed Jardiance for you. Please be aware the most common side effect of this medication is urinary tract infections and yeast infections. Please practice good hygiene and keep this area clean and dry to help prevent this. If you do begin to have symptoms of these infections, such as difficulty urinating or painful urination,  please let us know.  Labs done today, your results will be available in MyChart, we will contact you for abnormal readings.  Your physician recommends that you return for lab work in: 10-14 days  Your physician recommends that you schedule a follow-up appointment in: 2 months  If you have any questions or concerns before your next appointment please send [place] a message through [place] or call our office at [phone].    TO LEAVE A MESSAGE FOR THE NURSE SELECT OPTION 2, PLEASE LEAVE A MESSAGE INCLUDING: . YOUR NAME . DATE OF BIRTH . CALL BACK NUMBER . REASON FOR CALL**this is important as we prioritize the call backs  [place] AS LONG AS YOU CALL BEFORE 4:00 PM  At the [facility], you and your health needs are our priority. As part of our continuing mission to provide you with exceptional heart care, we have created designated Provider Care Teams. These Care Teams include your primary Cardiologist (physician) and Advanced Practice Providers (APPs- Physician Assistants and Nurse Practitioners) who all work together to provide you with the care you need, when you need it.   You may see any of the following providers on your designated Care Team at your next follow up: [name] Dr Glori Bickers . Dr Loralie Champagne . Darrick Grinder, NP . Lyda Jester, PA . Audry Riles, PharmD   Please be sure to bring in all your medications bottles to every appointment.

## 2020-09-08 NOTE — Progress Notes (Signed)
Patient is being seen in the Advanced Heart Failure Clinic. VS, EKG, and device interrogations performed in the clinic. Dr McLean is at home and seeing patients via telemedicine as he is in quarantine.   

## 2020-09-09 LAB — CUP PACEART REMOTE DEVICE CHECK
Battery Remaining Longevity: 72 mo
Battery Remaining Percentage: 81 %
Battery Voltage: 2.98 V
Date Time Interrogation Session: 20220112110745
HighPow Impedance: 59 Ohm
Implantable Lead Implant Date: 20201130
Implantable Lead Implant Date: 20201130
Implantable Lead Location: 753858
Implantable Lead Location: 753860
Implantable Pulse Generator Implant Date: 20201130
Lead Channel Impedance Value: 500 Ohm
Lead Channel Impedance Value: 500 Ohm
Lead Channel Pacing Threshold Amplitude: 0.5 V
Lead Channel Pacing Threshold Amplitude: 0.875 V
Lead Channel Pacing Threshold Pulse Width: 0.4 ms
Lead Channel Pacing Threshold Pulse Width: 0.4 ms
Lead Channel Sensing Intrinsic Amplitude: 12 mV
Lead Channel Setting Pacing Amplitude: 1.875
Lead Channel Setting Pacing Amplitude: 2.5 V
Lead Channel Setting Pacing Pulse Width: 0.4 ms
Lead Channel Setting Pacing Pulse Width: 0.4 ms
Lead Channel Setting Sensing Sensitivity: 0.5 mV
Pulse Gen Serial Number: 810000193

## 2020-09-20 ENCOUNTER — Other Ambulatory Visit: Payer: Self-pay

## 2020-09-20 ENCOUNTER — Ambulatory Visit (HOSPITAL_COMMUNITY)
Admission: RE | Admit: 2020-09-20 | Discharge: 2020-09-20 | Disposition: A | Discharge status: Home / Self Care | Payer: Medicare Other | Source: Ambulatory Visit | Attending: Cardiology | Admitting: Cardiology

## 2020-09-20 DIAGNOSIS — I5022 Chronic systolic (congestive) heart failure: Secondary | ICD-10-CM | POA: Insufficient documentation

## 2020-09-20 LAB — BASIC METABOLIC PANEL
Anion gap: 15 (ref 5–15)
BUN: 42 mg/dL — ABNORMAL HIGH (ref 8–23)
CO2: 28 mmol/L (ref 22–32)
Calcium: 10 mg/dL (ref 8.9–10.3)
Chloride: 95 mmol/L — ABNORMAL LOW (ref 98–111)
Creatinine, Ser: 2.48 mg/dL — ABNORMAL HIGH (ref 0.44–1.00)
GFR, Estimated: 19 mL/min — ABNORMAL LOW (ref >60)
Glucose, Bld: 119 mg/dL — ABNORMAL HIGH (ref 70–99)
Potassium: 4.1 mmol/L (ref 3.5–5.1)
Sodium: 138 mmol/L (ref 135–145)

## 2020-09-21 NOTE — Progress Notes (Signed)
Remote ICD transmission.   

## 2020-09-28 ENCOUNTER — Ambulatory Visit (INDEPENDENT_AMBULATORY_CARE_PROVIDER_SITE_OTHER): Payer: Medicare Other

## 2020-09-28 DIAGNOSIS — I5022 Chronic systolic (congestive) heart failure: Secondary | ICD-10-CM

## 2020-09-28 DIAGNOSIS — Z95810 Presence of automatic (implantable) cardiac defibrillator: Secondary | ICD-10-CM | POA: Diagnosis not present

## 2020-10-01 ENCOUNTER — Encounter: Payer: Self-pay | Admitting: Physical Medicine and Rehabilitation

## 2020-10-01 ENCOUNTER — Other Ambulatory Visit: Payer: Self-pay

## 2020-10-01 ENCOUNTER — Ambulatory Visit (INDEPENDENT_AMBULATORY_CARE_PROVIDER_SITE_OTHER): Payer: Medicare Other | Admitting: Physical Medicine and Rehabilitation

## 2020-10-01 DIAGNOSIS — R202 Paresthesia of skin: Secondary | ICD-10-CM

## 2020-10-01 NOTE — Progress Notes (Signed)
Numbness in second and third fingers on right hand. Painful spot on right wrist. Difficulty holding pencil and utensils. Similar symptoms on left.  Right hand dominant No lotion per patient Numeric Pain Rating Scale and Functional Assessment Average Pain 10   In the last MONTH (on 0-10 scale) has pain interfered with the following?  1. General activity like being  able to carry out your everyday physical activities such as walking, climbing stairs, carrying groceries, or moving a chair?  Rating(10)

## 2020-10-01 NOTE — Procedures (Signed)
EMG & NCV Findings: Evaluation of the left median motor and the right median motor nerves showed prolonged distal onset latency (L13.8, R17.9 ms), reduced amplitude (L0.0, R0.1 mV), and decreased conduction velocity (Elbow-Wrist, L22, R26 m/s).  The left median (across palm) sensory and the right median (across palm) sensory nerves showed no response (Wrist) and no response (Palm).  The right ulnar sensory nerve showed prolonged distal peak latency (4.7 ms) and decreased conduction velocity (Wrist-5th Digit, 30 m/s).  All remaining nerves (as indicated in the following tables) were within normal limits.  Left vs. Right side comparison data for the median motor nerve indicates abnormal L-R latency difference (4.1 ms) and abnormal L-R amplitude difference (100.0 %).    Needle evaluation of the right abductor pollicis brevis muscle showed decreased insertional activity, widespread spontaneous activity, and diminished recruitment.  All remaining muscles (as indicated in the following table) showed no evidence of electrical instability.    Impression: The above electrodiagnostic study is ABNORMAL and reveals evidence of a severe bilateral median nerve entrapment at the wrist (carpal tunnel syndrome) affecting sensory and motor components. The lesion is characterized by sensory and motor demyelination with evidence of significant axonal injury.   There is no significant electrodiagnostic evidence of any other focal nerve entrapment or generalized peripheral neuropathy.   Recommendations: 1.  Follow-up with referring physician. 2.  Continue current management of symptoms. 3.  Suggest surgical evaluation.  ___________________________ Laurence Spates FAAPMR Board Certified, American Board of Physical Medicine and Rehabilitation    Nerve Conduction Studies Anti Sensory Summary Table   Stim Site NR Peak (ms) Norm Peak (ms) P-T Amp (V) Norm P-T Amp Site1 Site2 Delta-P (ms) Dist (cm) Vel (m/s) Norm Vel (m/s)   Left Median Acr Palm Anti Sensory (2nd Digit)  31.3C  Wrist *NR  <3.6  >10 Wrist Palm  0.0    Palm *NR  <2.0          Right Median Acr Palm Anti Sensory (2nd Digit)  30.2C  Wrist *NR  <3.6  >10 Wrist Palm  0.0    Palm *NR  <2.0          Right Radial Anti Sensory (Base 1st Digit)  30.4C  Wrist    2.9 <3.1 8.7  Wrist Base 1st Digit 2.9 0.0    Right Ulnar Anti Sensory (5th Digit)  30.5C  Wrist    *4.7 <3.7 15.2 >15.0 Wrist 5th Digit 4.7 14.0 *30 >38   Motor Summary Table   Stim Site NR Onset (ms) Norm Onset (ms) O-P Amp (mV) Norm O-P Amp Site1 Site2 Delta-0 (ms) Dist (cm) Vel (m/s) Norm Vel (m/s)  Left Median Motor (Abd Poll Brev)  31.2C  Wrist    *13.8 <4.2 *0.0 >5 Elbow Wrist 10.0 22.0 *22 >50  Elbow    23.8  0.2         Right Median Motor (Abd Poll Brev)  30.3C  Wrist    *17.9 <4.2 *0.1 >5 Elbow Wrist 7.6 20.0 *26 >50  Elbow    25.5  0.0         Right Ulnar Motor (Abd Dig Min)  30.4C  Wrist    4.1 <4.2 5.2 >3 B Elbow Wrist 3.7 19.5 53 >53  B Elbow    7.8  5.0  A Elbow B Elbow 1.7 10.0 59 >53  A Elbow    9.5  5.1          EMG   Side Muscle Nerve Root  Ins Act Fibs Psw Amp Dur Poly Recrt Int Fraser Din Comment  Right Abd Poll Brev Median C8-T1 *Decr *4+ *4+ Nml Nml 0 *Reduced Nml rare Muap  Right 1stDorInt Ulnar C8-T1 Nml Nml Nml Nml Nml 0 Nml Nml     Nerve Conduction Studies Anti Sensory Left/Right Comparison   Stim Site L Lat (ms) R Lat (ms) L-R Lat (ms) L Amp (V) R Amp (V) L-R Amp (%) Site1 Site2 L Vel (m/s) R Vel (m/s) L-R Vel (m/s)  Median Acr Palm Anti Sensory (2nd Digit)  31.3C  Wrist       Wrist Palm     Palm             Radial Anti Sensory (Base 1st Digit)  30.4C  Wrist  2.9   8.7  Wrist Base 1st Digit     Ulnar Anti Sensory (5th Digit)  30.5C  Wrist  *4.7   15.2  Wrist 5th Digit  *30    Motor Left/Right Comparison   Stim Site L Lat (ms) R Lat (ms) L-R Lat (ms) L Amp (mV) R Amp (mV) L-R Amp (%) Site1 Site2 L Vel (m/s) R Vel (m/s) L-R Vel (m/s)  Median Motor  (Abd Poll Brev)  31.2C  Wrist *13.8 *17.9 *4.1 *0.0 *0.1 *100.0 Elbow Wrist *22 *26 4  Elbow 23.8 25.5 1.7 0.2 0.0 100.0       Ulnar Motor (Abd Dig Min)  30.4C  Wrist  4.1   5.2  B Elbow Wrist  53   B Elbow  7.8   5.0  A Elbow B Elbow  59   A Elbow  9.5   5.1           Waveforms:

## 2020-10-01 NOTE — Progress Notes (Signed)
EPIC Encounter for ICM Monitoring  Patient Name: Elaine [name] is a 80 y.o. female Date: 10/01/2020 Primary Care Physican: Marton Redwood, MD Primary Cardiologist:Harding/McLean Electrophysiologist:[place] Bi-V Pacing:94% 09/08/2020 Office Weight: 219 lbs   Spoke with patient and reports feeling well at this time.  Denies fluid symptoms.    CorVue thoracic impedancenormal.  Prescribed:   Torsemide 20 mg take5 tablets (100 mg total)every morning and 4 tablets (80 mg total) every evening.  Potassium 20 mEq 1 tablet twice a day  Labs: 09/20/2020 Creatinine 2.48, BUN 42, Potassium 4.1, Sodium 138, GFR 19 09/08/2020 Creatinine 2.28, BUN 38, Potassium 4.2, Sodium 142, GFR 21  08/26/2020 Creatinine 2.31, BUN 28, Potassium 4.0, Sodium 137, GFR 21  08/18/2020 Creatinine 2.41, BUN 31, Potassium 4.1, Sodium 139, GFR 20  A complete set of results can be found in Results Review.  Recommendations: No changes and encouraged to call if experiencing any fluid symptoms.  Follow-up plan: ICM clinic phone appointment on3/02/2021. 91 day device clinic remote transmission4/13/2022.    EP/Cardiology Office Visits:  11/23/2020 with Dr Aundra Dubin.   Copy of ICM check sent to [facility].   3 month ICM trend: 09/28/2020.    1 Year ICM trend:       [name], RN 10/01/2020 10:05 AM

## 2020-10-04 NOTE — Progress Notes (Signed)
Elaine [name] - 80 y.o. female MRN [id number]  Date of birth: 1940-12-07  Office Visit Note: Visit Date: 10/01/2020 PCP: Marton Redwood, MD Referred by: Marton Redwood, MD  Subjective: Chief Complaint  Patient presents with  . Right Hand - Numbness  . Left Hand - Numbness   HPI:  Elaine [name] is a 80 y.o. female who comes in today at the request of Benita Stabile, PA-C for electrodiagnostic study of the Bilateral upper extremities.  Patient is Right hand dominant.  She endorses 10 out of 10 pain numbness and tingling and weakness in both hands.  Her history her history is complicated by multiple medical issues as well as diabetes.  She does carry a diagnosis of diabetic neuropathy.  No prior electrodiagnostic studies.  She reports numbness in the second and third digits on the right hand and some more on the left.  She has a painful area on the right wrist.  She has difficulty holding pencils and utensils.  She has had trouble manipulating objects with the right hand.  She denies any frank radicular symptoms.   ROS Otherwise per HPI.  Assessment & Plan: Visit Diagnoses:    ICD-10-CM   1. Paresthesia of skin  R20.2 NCV with EMG (electromyography)    Plan: Impression: The above electrodiagnostic study is ABNORMAL and reveals evidence of a severe bilateral median nerve entrapment at the wrist (carpal tunnel syndrome) affecting sensory and motor components. The lesion is characterized by sensory and motor demyelination with evidence of significant axonal injury.   There is no significant electrodiagnostic evidence of any other focal nerve entrapment or generalized peripheral neuropathy.   Recommendations: 1.  Follow-up with referring physician. 2.  Continue current management of symptoms. 3.  Suggest surgical evaluation.  Meds & Orders: No orders of the defined types were placed in this encounter.   Orders Placed This Encounter  Procedures  . NCV with EMG (electromyography)     Follow-up: Return in about 2 weeks (around 10/15/2020) for Benita Stabile, PA-C.   Procedures: No procedures performed  EMG & NCV Findings: Evaluation of the left median motor and the right median motor nerves showed prolonged distal onset latency (L13.8, R17.9 ms), reduced amplitude (L0.0, R0.1 mV), and decreased conduction velocity (Elbow-Wrist, L22, R26 m/s).  The left median (across palm) sensory and the right median (across palm) sensory nerves showed no response (Wrist) and no response (Palm).  The right ulnar sensory nerve showed prolonged distal peak latency (4.7 ms) and decreased conduction velocity (Wrist-5th Digit, 30 m/s).  All remaining nerves (as indicated in the following tables) were within normal limits.  Left vs. Right side comparison data for the median motor nerve indicates abnormal L-R latency difference (4.1 ms) and abnormal L-R amplitude difference (100.0 %).    Needle evaluation of the right abductor pollicis brevis muscle showed decreased insertional activity, widespread spontaneous activity, and diminished recruitment.  All remaining muscles (as indicated in the following table) showed no evidence of electrical instability.    Impression: The above electrodiagnostic study is ABNORMAL and reveals evidence of a severe bilateral median nerve entrapment at the wrist (carpal tunnel syndrome) affecting sensory and motor components. The lesion is characterized by sensory and motor demyelination with evidence of significant axonal injury.   There is no significant electrodiagnostic evidence of any other focal nerve entrapment or generalized peripheral neuropathy.   Recommendations: 1.  Follow-up with referring physician. 2.  Continue current management of symptoms. 3.  Suggest surgical evaluation.  ___________________________ Laurence Spates FAAPMR Board Certified, American Board of Physical Medicine and Rehabilitation    Nerve Conduction Studies Anti Sensory Summary Table    Stim Site NR Peak (ms) Norm Peak (ms) P-T Amp (V) Norm P-T Amp Site1 Site2 Delta-P (ms) Dist (cm) Vel (m/s) Norm Vel (m/s)  Left Median Acr Palm Anti Sensory (2nd Digit)  31.3C  Wrist *NR  <3.6  >10 Wrist Palm  0.0    Palm *NR  <2.0          Right Median Acr Palm Anti Sensory (2nd Digit)  30.2C  Wrist *NR  <3.6  >10 Wrist Palm  0.0    Palm *NR  <2.0          Right Radial Anti Sensory (Base 1st Digit)  30.4C  Wrist    2.9 <3.1 8.7  Wrist Base 1st Digit 2.9 0.0    Right Ulnar Anti Sensory (5th Digit)  30.5C  Wrist    *4.7 <3.7 15.2 >15.0 Wrist 5th Digit 4.7 14.0 *30 >38   Motor Summary Table   Stim Site NR Onset (ms) Norm Onset (ms) O-P Amp (mV) Norm O-P Amp Site1 Site2 Delta-0 (ms) Dist (cm) Vel (m/s) Norm Vel (m/s)  Left Median Motor (Abd Poll Brev)  31.2C  Wrist    *13.8 <4.2 *0.0 >5 Elbow Wrist 10.0 22.0 *22 >50  Elbow    23.8  0.2         Right Median Motor (Abd Poll Brev)  30.3C  Wrist    *17.9 <4.2 *0.1 >5 Elbow Wrist 7.6 20.0 *26 >50  Elbow    25.5  0.0         Right Ulnar Motor (Abd Dig Min)  30.4C  Wrist    4.1 <4.2 5.2 >3 B Elbow Wrist 3.7 19.5 53 >53  B Elbow    7.8  5.0  A Elbow B Elbow 1.7 10.0 59 >53  A Elbow    9.5  5.1          EMG   Side Muscle Nerve Root Ins Act Fibs Psw Amp Dur Poly Recrt Int Fraser Din Comment  Right Abd Poll Brev Median C8-T1 *Decr *4+ *4+ Nml Nml 0 *Reduced Nml rare Muap  Right 1stDorInt Ulnar C8-T1 Nml Nml Nml Nml Nml 0 Nml Nml     Nerve Conduction Studies Anti Sensory Left/Right Comparison   Stim Site L Lat (ms) R Lat (ms) L-R Lat (ms) L Amp (V) R Amp (V) L-R Amp (%) Site1 Site2 L Vel (m/s) R Vel (m/s) L-R Vel (m/s)  Median Acr Palm Anti Sensory (2nd Digit)  31.3C  Wrist       Wrist Palm     Palm             Radial Anti Sensory (Base 1st Digit)  30.4C  Wrist  2.9   8.7  Wrist Base 1st Digit     Ulnar Anti Sensory (5th Digit)  30.5C  Wrist  *4.7   15.2  Wrist 5th Digit  *30    Motor Left/Right Comparison   Stim Site L Lat  (ms) R Lat (ms) L-R Lat (ms) L Amp (mV) R Amp (mV) L-R Amp (%) Site1 Site2 L Vel (m/s) R Vel (m/s) L-R Vel (m/s)  Median Motor (Abd Poll Brev)  31.2C  Wrist *13.8 *17.9 *4.1 *0.0 *0.1 *100.0 Elbow Wrist *22 *26 4  Elbow 23.8 25.5 1.7 0.2 0.0 100.0       Ulnar Motor (Abd Dig  Min)  30.4C  Wrist  4.1   5.2  B Elbow Wrist  53   B Elbow  7.8   5.0  A Elbow B Elbow  59   A Elbow  9.5   5.1           Waveforms:                 Clinical History: No specialty comments available.     Objective:  VS:  HT:    WT:   BMI:     BP:   HR: bpm  TEMP: ( )  RESP:  Physical Exam Musculoskeletal:        General: No swelling, tenderness or deformity.     Comments: Inspection reveals flattening and some atrophy of the bilateral APB but no no atrophy of the bilateral FDI or hand intrinsics. There is no swelling, color changes, allodynia or dystrophic changes. There is 5 out of 5 strength in the bilateral wrist extension, finger abduction and long finger flexion.  There is decreased sensation in a classic median nerve distribution bilaterally right more affected than left. There is a negative Hoffmann's test bilaterally.  Skin:    General: Skin is warm and dry.     Findings: No erythema or rash.  Neurological:     General: No focal deficit present.     Mental Status: She is alert and oriented to person, place, and time.     Motor: No weakness or abnormal muscle tone.     Coordination: Coordination normal.  Psychiatric:        Mood and Affect: Mood normal.        Behavior: Behavior normal.      Imaging: No results found.

## 2020-10-08 ENCOUNTER — Ambulatory Visit
Admission: RE | Admit: 2020-10-08 | Discharge: 2020-10-08 | Disposition: A | Discharge status: Home / Self Care | Payer: Medicare Other | Source: Ambulatory Visit | Attending: Internal Medicine | Admitting: Internal Medicine

## 2020-10-08 ENCOUNTER — Other Ambulatory Visit: Payer: Self-pay | Admitting: Internal Medicine

## 2020-10-08 ENCOUNTER — Other Ambulatory Visit: Payer: Self-pay

## 2020-10-08 DIAGNOSIS — Z1231 Encounter for screening mammogram for malignant neoplasm of breast: Secondary | ICD-10-CM

## 2020-10-18 ENCOUNTER — Encounter: Payer: Self-pay | Admitting: Orthopaedic Surgery

## 2020-10-18 ENCOUNTER — Ambulatory Visit (INDEPENDENT_AMBULATORY_CARE_PROVIDER_SITE_OTHER): Payer: Medicare Other | Admitting: Orthopaedic Surgery

## 2020-10-18 DIAGNOSIS — G5602 Carpal tunnel syndrome, left upper limb: Secondary | ICD-10-CM

## 2020-10-18 DIAGNOSIS — G5601 Carpal tunnel syndrome, right upper limb: Secondary | ICD-10-CM | POA: Diagnosis not present

## 2020-10-18 NOTE — Progress Notes (Signed)
The patient comes in today to go over bilateral upper extremity nerve conduction studies.  Both hands are numb and tingly in her right dominant hand has been more weak for her.  She is a diabetic that has been under good control with a hemoglobin A1c of 6.2 but she is been a diabetic for many years now.  There is no evidence of muscle atrophy of either hand but she does have significant weakness on pinch and grip strength and numbness and tingling globally.  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  I discussed in length surgery of the transverse carpal ligament on the right side.  However, she understands that this would not fully treat the numbness and tingling.  She would probably never get full recovery given the degree of her axonal injury and diabetes and this is been longstanding.  She is turning 80 years old next week.  She definitely will continue to have neuropathy and likely weakness.  The carpal tunnel release would help from a pain standpoint.  Given her advanced age, there is a good chance that she would still have the same symptoms.  Her and her daughter will think about this and let us know if they would like to proceed with the surgery.  Her daughter has had this by me before and understands fully what it involves.  If he gets to the point where they would like to consider carpal tunnel surgery for the right side they will let us know.  All questions and concerns were answered and addressed.

## 2020-11-01 ENCOUNTER — Ambulatory Visit (INDEPENDENT_AMBULATORY_CARE_PROVIDER_SITE_OTHER): Payer: Medicare Other

## 2020-11-01 DIAGNOSIS — I5022 Chronic systolic (congestive) heart failure: Secondary | ICD-10-CM | POA: Diagnosis not present

## 2020-11-01 DIAGNOSIS — Z95810 Presence of automatic (implantable) cardiac defibrillator: Secondary | ICD-10-CM | POA: Diagnosis not present

## 2020-11-03 NOTE — Progress Notes (Signed)
EPIC Encounter for ICM Monitoring  Patient Name: Elaine [name] is a 80 y.o. female Date: 11/03/2020 Primary Care Physican: Ginger Organ., MD Primary Cardiologist:Harding/[facility]Lean Electrophysiologist:[place] Bi-V Pacing:93% 09/08/2020 Office Weight: 219 lbs   Transmission reviewed.  CorVue thoracic impedancenormal.  Prescribed:   Torsemide 20 mg take5 tablets (100 mg total)every morning and 4 tablets (80 mg total) every evening.  Potassium 20 mEq 1 tablet twice a day  Labs: 09/20/2020 Creatinine 2.48, BUN 42, Potassium 4.1, Sodium 138, GFR 19 09/08/2020 Creatinine 2.28, BUN 38, Potassium 4.2, Sodium 142, GFR 21  08/26/2020 Creatinine 2.31, BUN 28, Potassium 4.0, Sodium 137, GFR 21  08/18/2020 Creatinine 2.41, BUN 31, Potassium 4.1, Sodium 139, GFR 20  A complete set of results can be found in Results Review.  Recommendations: No changes.  Follow-up plan: ICM clinic phone appointment on4/14/2022. 91 day device clinic remote transmission4/13/2022.    EP/Cardiology Office Visits: 11/23/2020 with Dr Aundra Dubin.   Copy of ICM check sent to [facility].  3 month ICM trend: 11/01/2020.    1 Year ICM trend:       [name], RN 11/03/2020 3:05 PM

## 2020-11-23 ENCOUNTER — Other Ambulatory Visit: Payer: Self-pay

## 2020-11-23 ENCOUNTER — Ambulatory Visit (HOSPITAL_COMMUNITY)
Admission: RE | Admit: 2020-11-23 | Discharge: 2020-11-23 | Disposition: A | Discharge status: Home / Self Care | Payer: Medicare Other | Source: Ambulatory Visit | Attending: Cardiology | Admitting: Cardiology

## 2020-11-23 ENCOUNTER — Encounter (HOSPITAL_COMMUNITY): Payer: Self-pay | Admitting: Cardiology

## 2020-11-23 VITALS — BP 114/62 | HR 80 | Wt 220.6 lb

## 2020-11-23 DIAGNOSIS — Z79899 Other long term (current) drug therapy: Secondary | ICD-10-CM | POA: Diagnosis not present

## 2020-11-23 DIAGNOSIS — E1122 Type 2 diabetes mellitus with diabetic chronic kidney disease: Secondary | ICD-10-CM | POA: Diagnosis not present

## 2020-11-23 DIAGNOSIS — J449 Chronic obstructive pulmonary disease, unspecified: Secondary | ICD-10-CM | POA: Diagnosis not present

## 2020-11-23 DIAGNOSIS — Z596 Low income: Secondary | ICD-10-CM | POA: Diagnosis not present

## 2020-11-23 DIAGNOSIS — I447 Left bundle-branch block, unspecified: Secondary | ICD-10-CM | POA: Insufficient documentation

## 2020-11-23 DIAGNOSIS — Z7984 Long term (current) use of oral hypoglycemic drugs: Secondary | ICD-10-CM | POA: Insufficient documentation

## 2020-11-23 DIAGNOSIS — N183 Chronic kidney disease, stage 3 unspecified: Secondary | ICD-10-CM | POA: Diagnosis not present

## 2020-11-23 DIAGNOSIS — Z79890 Hormone replacement therapy (postmenopausal): Secondary | ICD-10-CM | POA: Insufficient documentation

## 2020-11-23 DIAGNOSIS — I4821 Permanent atrial fibrillation: Secondary | ICD-10-CM | POA: Diagnosis not present

## 2020-11-23 DIAGNOSIS — Z951 Presence of aortocoronary bypass graft: Secondary | ICD-10-CM | POA: Diagnosis not present

## 2020-11-23 DIAGNOSIS — Z7901 Long term (current) use of anticoagulants: Secondary | ICD-10-CM | POA: Diagnosis not present

## 2020-11-23 DIAGNOSIS — Z95810 Presence of automatic (implantable) cardiac defibrillator: Secondary | ICD-10-CM | POA: Diagnosis not present

## 2020-11-23 DIAGNOSIS — I482 Chronic atrial fibrillation, unspecified: Secondary | ICD-10-CM | POA: Diagnosis not present

## 2020-11-23 DIAGNOSIS — I5022 Chronic systolic (congestive) heart failure: Secondary | ICD-10-CM | POA: Insufficient documentation

## 2020-11-23 DIAGNOSIS — Z87891 Personal history of nicotine dependence: Secondary | ICD-10-CM | POA: Diagnosis not present

## 2020-11-23 DIAGNOSIS — E1140 Type 2 diabetes mellitus with diabetic neuropathy, unspecified: Secondary | ICD-10-CM | POA: Insufficient documentation

## 2020-11-23 DIAGNOSIS — I2510 Atherosclerotic heart disease of native coronary artery without angina pectoris: Secondary | ICD-10-CM | POA: Diagnosis not present

## 2020-11-23 LAB — BASIC METABOLIC PANEL
Anion gap: 10 (ref 5–15)
BUN: 44 mg/dL — ABNORMAL HIGH (ref 8–23)
CO2: 29 mmol/L (ref 22–32)
Calcium: 9.7 mg/dL (ref 8.9–10.3)
Chloride: 97 mmol/L — ABNORMAL LOW (ref 98–111)
Creatinine, Ser: 2.59 mg/dL — ABNORMAL HIGH (ref 0.44–1.00)
GFR, Estimated: 18 mL/min — ABNORMAL LOW (ref >60)
Glucose, Bld: 143 mg/dL — ABNORMAL HIGH (ref 70–99)
Potassium: 4.1 mmol/L (ref 3.5–5.1)
Sodium: 136 mmol/L (ref 135–145)

## 2020-11-23 LAB — CBC
HCT: 41.2 % (ref 36.0–46.0)
Hemoglobin: 13.3 g/dL (ref 12.0–15.0)
MCH: 28.5 pg (ref 26.0–34.0)
MCHC: 32.3 g/dL (ref 30.0–36.0)
MCV: 88.4 fL (ref 80.0–100.0)
Platelets: 207 10*3/uL (ref 150–400)
RBC: 4.66 MIL/uL (ref 3.87–5.11)
RDW: 15.9 % — ABNORMAL HIGH (ref 11.5–15.5)
WBC: 4.7 10*3/uL (ref 4.0–10.5)
nRBC: 0 % (ref 0.0–0.2)

## 2020-11-23 LAB — LIPID PANEL
Cholesterol: 154 mg/dL (ref 0–200)
HDL: 64 mg/dL (ref >40)
LDL Cholesterol: 77 mg/dL (ref 0–99)
Total CHOL/HDL Ratio: 2.4 RATIO
Triglycerides: 66 mg/dL (ref <150)
VLDL: 13 mg/dL (ref 0–40)

## 2020-11-23 MED ORDER — METOPROLOL SUCCINATE ER 100 MG PO TB24: ORAL_TABLET | ORAL | 3 refills | Status: DC

## 2020-11-23 NOTE — Patient Instructions (Addendum)
Labs done today. We will contact you only if your labs are abnormal.  INCREASE Metoprolol 100mg  (1 tablet) by mouth every morning and 50mg  (1/2 tablet) by mouth every evening.    No other medication changes were made. Please continue all current medications as prescribed.  Your physician recommends that you schedule a follow-up appointment in: 3 months with our APP Clinic here in office   If you have any questions or concerns before your next appointment please send [place] a message through [place] or call our office at [phone].    TO LEAVE A MESSAGE FOR THE NURSE SELECT OPTION 2, PLEASE LEAVE A MESSAGE INCLUDING: . YOUR NAME . DATE OF BIRTH . CALL BACK NUMBER . REASON FOR CALL**this is important as we prioritize the call backs  YOU WILL RECEIVE A CALL BACK THE SAME DAY AS LONG AS YOU CALL BEFORE 4:00 PM   Do the following things EVERYDAY: 1) Weigh yourself in the morning before breakfast. Write it down and keep it in a log. 2) Take your medicines as prescribed 3) Eat low salt foods--Limit salt (sodium) to 2000 mg per day.  4) Stay as active as you can everyday 5) Limit all fluids for the day to less than 2 liters   At the [facility], you and your health needs are our priority. As part of our continuing mission to provide you with exceptional heart care, we have created designated Provider Care Teams. These Care Teams include your primary Cardiologist (physician) and Advanced Practice Providers (APPs- Physician Assistants and Nurse Practitioners) who all work together to provide you with the care you need, when you need it.   You may see any of the following providers on your designated Care Team at your next follow up: [name] Dr Glori Bickers . Dr Loralie Champagne . Darrick Grinder, NP . Lyda Jester, PA . Audry Riles, PharmD   Please be sure to bring in all your medications bottles to every appointment.

## 2020-11-23 NOTE — Progress Notes (Signed)
ID:  Elaine [name], DOB 03/31/1941, MRN [id number]  Provider location: [place] [facility] Type of Visit: Established patient  PCP:  Marton Redwood, MD  Cardiologist:  Glenetta Hew, MD Primary HF: Dr. Aundra Dubin   History of Present Illness: Elaine [name] is a 80 y.o. with history of CAD s/p CABG, COPD, CKD stage 3, and chronic systolic systolic CHF/ischemic cardiomyopathy.  She was referred by Dr. Ellyn Hack for evaluation of CHF.  Patient was admitted in 2/15 with atrial fibrillation/RVR and chest pain.  Cath showed severe 3VD and she had CABG x 3.  Echo in 2/15 showed EF down to 20-25%. In 2018, it appears that she went back into atrial fibrillation and has remained in atrial fibrillation persistently.  DCCV was not attempted.   Most recent echo in 10/20 showed EF < 20% with normal RV.  She had a St Jude CRT-D device implanted in 11/20.  She says that she feels "90%" better with CRT.  She is followed by Dr. Hollie Salk with nephrology.   She has been in atrial fibrillation persistently, I attempted DCCV while on amiodarone but she did not convert.  Therefore, I stopped amiodarone.    Echo in 3/21 showed EF 20% with diffuse hypokinesis, mildly dilated RV with mildly decreased systolic function, PASP 49 mmHg, dilated IVC.  In 9/21, she had AV nodal ablation.   She returns now for followup of CHF.  Weight is stable. Main complaint is neuropathy pain in her feet with burning and tingling, this limits her ambulation (chronic).  She walks around her house without dyspnea but does not walk much outside the house.  No chest pain.  No orthopnea/PND.  No BRBPR/melena.     ECG (personally reviewed): atrial fibrillation, LBBB with occasional BiV pacing (personally reviewed).  Labs (2/21): K 4.5, creatinine 2.08 => 2.99, hgb 12.8 Labs (7/21): K 4, creatinine 2.3, TSH normal Labs (12/21): K 4, creatinine 2.31, BNP 671 Labs (1/22): K 4.1, creatinine 2.48   PMH: 1. CAD: s/p CABG in 2/15 with LIMA-LAD,  SVG-OM2, SVG-PLV.  2. LBBB: Chronic.  3. Atrial fibrillation: Persistent since 2018.  - LA appendage clip with CBGdldkl 4. H/o aspiration PNA 5. Type 2 diabetes 6. COPD 7. CKD stage 3 8. Chronic systolic CHF: Ischemic cardiomyopathy.  She has a St Jude BiV ICD.  - Echo (2/15): EF 20-25%.  - Echo (5/15): EF 40-45% - Echo (10/20): EF <20%, mild LVH, normal RV size and systolic function.  - Echo (3/21): EF 20% with diffuse hypokinesis, mildly dilated RV with mildly decreased systolic function, PASP 49 mmHg, dilated IVC.  - AV nodal ablation 9. CKD: Stage 3.   Social History   Socioeconomic History  . Marital status: Widowed    Spouse name: Not on file  . Number of children: Not on file  . Years of education: Not on file  . Highest education level: Not on file  Occupational History  . Occupation: retired  Tobacco Use  . Smoking status: Former Smoker    Packs/day: 0.50    Years: 39.00    Pack years: 19.50    Types: Cigarettes    Quit date: 08/28/1993    Years since quitting: 27.2  . Smokeless tobacco: Never Used  Vaping Use  . Vaping Use: Never used  Substance and Sexual Activity  . Alcohol use: No    Comment: quit 95  . Drug use: No  . Sexual activity: Not on file  Other Topics Concern  .  Not on file  Social History Narrative   Lives with husband and son in [place].  She is currently retired.   Former smoker quit in 1995.   Social Determinants of Health   Financial Resource Strain: Medium Risk  . Difficulty of Paying Living Expenses: Somewhat hard  Food Insecurity: No Food Insecurity  . Worried About Charity fundraiser in the Last Year: Never true  . Ran Out of Food in the Last Year: Never true  Transportation Needs: No Transportation Needs  . Lack of Transportation (Medical): No  . Lack of Transportation (Non-Medical): No  Physical Activity: Not on file  Stress: Not on file  Social Connections: Not on file  Intimate Partner Violence: Not on file   Family  History  Adopted: Yes  Problem Relation Age of Onset  . Other Mother   . Hypertension Mother   . Colon cancer Neg Hx   . Esophageal cancer Neg Hx   . Rectal cancer Neg Hx   . Stomach cancer Neg Hx    Current Outpatient Medications  Medication Sig Dispense Refill  . acetaminophen (TYLENOL) 650 MG CR tablet Take 1,300 mg by mouth 2 (two) times daily.    [name] albuterol (VENTOLIN HFA) 108 (90 Base) MCG/ACT inhaler Inhale 2 puffs into the lungs every 6 (six) hours as needed for wheezing or shortness of breath.    [name] apixaban (ELIQUIS) 5 MG TABS tablet Take 1 tablet (5 mg total) by mouth 2 (two) times daily. Resume Saturday evening. 60 tablet   . Coenzyme Q10 (COQ-10) 100 MG CAPS Take 100 mg by mouth in the morning and at bedtime.     . colchicine 0.6 MG tablet Take 0.6 mg by mouth daily as needed (Gout).     [name] empagliflozin (JARDIANCE) 10 MG TABS tablet Take 1 tablet (10 mg total) by mouth daily before breakfast. 90 tablet 2  . ezetimibe (ZETIA) 10 MG tablet Take 10 mg by mouth daily.    . ferrous sulfate 325 (65 FE) MG tablet Take 325 mg by mouth every other day.    . isosorbide-hydrALAZINE (BIDIL) 20-37.5 MG tablet Take 0.5 tablets by mouth in the morning, at noon, and at bedtime.    [name] levothyroxine (SYNTHROID) 112 MCG tablet Take 1 tablet (112 mcg total) by mouth daily before breakfast. 30 tablet 12  . Multiple Vitamin (MULTIVITAMIN WITH MINERALS) TABS tablet Take 1 tablet by mouth daily.    . Multiple Vitamins-Minerals (PRESERVISION AREDS 2) CAPS Take 1 capsule by mouth 2 (two) times daily.    . pantoprazole (PROTONIX) 40 MG tablet Take 40 mg by mouth daily.    . potassium chloride SA (KLOR-CON) 20 MEQ tablet Take 1 tablet (20 mEq total) by mouth 2 (two) times daily. 30 tablet 11  . rosuvastatin (CRESTOR) 5 MG tablet Take 5 mg by mouth every other day.     . torsemide (DEMADEX) 20 MG tablet Take 5 tablets (100 mg total) by mouth in the morning AND 4 tablets (80 mg total) every evening. 270  tablet 3  . umeclidinium-vilanterol (ANORO ELLIPTA) 62.5-25 MCG/INH AEPB Inhale 1 puff into the lungs daily.    . metoprolol succinate (TOPROL-XL) 100 MG 24 hr tablet Take 1 tablet (100 mg total) by mouth every morning AND 0.5 tablets (50 mg total) every evening. Take with or immediately following a meal.. 135 tablet 3   No current facility-administered medications for this encounter.   BP 114/62   Pulse 80   Wt  100.1 kg (220 lb 9.6 oz)   SpO2 99%   BMI 40.35 kg/m  Exam:   General: NAD Neck: JVP 7-8 cm, no thyromegaly or thyroid nodule.  Lungs: Clear to auscultation bilaterally with normal respiratory effort. CV: Nondisplaced PMI.  Heart regular S1/S2, no S3/S4, no murmur.  Trace edema.  No carotid bruit.  Difficult to palpate pedal pulses.  Abdomen: Soft, nontender, no hepatosplenomegaly, no distention.  Skin: Intact without lesions or rashes.  Neurologic: Alert and oriented x 3.  Psych: Normal affect. Extremities: No clubbing or cyanosis.  HEENT: Normal.   Assessment/Plan: 1. Chronic systolic CHF: Ischemic cardiomyopathy.  St Jude CRT-D device, now s/p AV nodal ablation.  Echo in 3/21 showed EF 20% with diffuse hypokinesis, mildly dilated RV with mildly decreased systolic function, PASP 49 mmHg, dilated IVC.  NYHA class II-III symptoms, better since AVN ablation.  She does not look volume overloaded on exam.  Meds limited by CKD stage IV.  - Continue torsemide 100 qam/80 bpm. BMET today.  - Continue empagliflozin 10 mg daily.   - She was unable to tolerate Bidil 1 tab tid, but she does fine with Bidil 1/2 tab tid so will continue.  - Increase Toprol XL to 100 qam/50 qpm.   - Off spironolactone with side effects.  2. CAD: S/p CABG in 2015. No chest pain.  - No ASA given apixaban use.  - Continue Crestor 5 daily + Zetia, check lipids.  3. Atrial fibrillation: Chronic now, looks like it has gone on since 2018.  I was unable to cardiovert her on amiodarone so it was stopped.  I do not  think that I will be able to get her out of atrial fibrillation. She is now s/p AVN ablation to allow more BiV pacing.  - Continue apixaban. CBC today.  4. CKD: Stage 3.  Needs BMET today.  Followup 3 months with APP.     Signed, Loralie Champagne, MD  11/23/2020  [facility] [address] [facility] [place] [address] [phone] (office) [phone] (fax)

## 2020-11-24 ENCOUNTER — Other Ambulatory Visit (HOSPITAL_COMMUNITY): Payer: Self-pay | Admitting: Cardiology

## 2020-12-08 ENCOUNTER — Ambulatory Visit (INDEPENDENT_AMBULATORY_CARE_PROVIDER_SITE_OTHER): Payer: Medicare Other

## 2020-12-08 ENCOUNTER — Other Ambulatory Visit: Payer: Self-pay

## 2020-12-08 DIAGNOSIS — I255 Ischemic cardiomyopathy: Secondary | ICD-10-CM

## 2020-12-08 DIAGNOSIS — I5022 Chronic systolic (congestive) heart failure: Secondary | ICD-10-CM

## 2020-12-08 DIAGNOSIS — Z95810 Presence of automatic (implantable) cardiac defibrillator: Secondary | ICD-10-CM

## 2020-12-08 NOTE — Progress Notes (Signed)
EPIC Encounter for ICM Monitoring  Patient Name: Elaine [name] is a 80 y.o. female Date: 12/08/2020 Primary Care Physican: Ginger Organ., MD Primary Cardiologist:Harding/McLean Electrophysiologist:[place] Bi-V Pacing:93% 11/24/2020 Weight: 214 lbs 4/13/2022Weight: 227 lbs   Spoke with patient and reports 12 lb weight gain within the last 2 weeks.  She is drinking more than 64 oz daily and understands she is drinking too much.   Education given for future, to contact office for weight gain of 3 lbs over night or 5 pounds with in a week.    CorVue thoracic impedancesuggesting possible fluid accumulation starting 11/29/2020.     Prescribed:   Torsemide 20 mg take5tablets (100 mg total)every morning and 4 tablets (80 mg total) every evening.  Potassium 20 mEq 1 tablet twice a day  Labs: 11/23/2020 Creatinine 2.59, BUN 44, Potassium 4.1, Sodium 136, GFR 18 09/20/2020 Creatinine2.48, BUN42, Potassium4.1, [id number], GFR19 09/08/2020 Creatinine2.28, BUN38, Potassium4.2, [id number], GFR21  08/26/2020 Creatinine2.31, BUN28, Potassium4.0, [id number], GFR21  08/18/2020 Creatinine2.41, BUN31, Potassium4.1, [id number], RCV89 A complete set of results can be found in Results Review.  Recommendations: Copy sent to Dr Aundra Dubin for review and recommendations.  Advised to strictly limit fluid intake to < 64 oz daily.   Follow-up plan: ICM clinic phone appointment on4/15/2022 to recheck fluid levels. 91 day device clinic remote transmission7/13/2022.    EP/Cardiology Office Visits: 02/15/2021 for HF clinic.     Copy of ICM check sent to [facility].  3 month ICM trend: 12/08/2020.    1 Year ICM trend:       [name], RN 12/08/2020 9:48 AM

## 2020-12-09 LAB — CUP PACEART REMOTE DEVICE CHECK
Battery Remaining Longevity: 74 mo
Battery Remaining Percentage: 79 %
Battery Voltage: 2.98 V
Date Time Interrogation Session: 20220413020013
HighPow Impedance: 62 Ohm
Implantable Lead Implant Date: 20201130
Implantable Lead Implant Date: 20201130
Implantable Lead Location: 753858
Implantable Lead Location: 753860
Implantable Pulse Generator Implant Date: 20201130
Lead Channel Impedance Value: 490 Ohm
Lead Channel Impedance Value: 530 Ohm
Lead Channel Pacing Threshold Amplitude: 0.5 V
Lead Channel Pacing Threshold Amplitude: 0.875 V
Lead Channel Pacing Threshold Pulse Width: 0.4 ms
Lead Channel Pacing Threshold Pulse Width: 0.4 ms
Lead Channel Sensing Intrinsic Amplitude: 12 mV
Lead Channel Setting Pacing Amplitude: 1.875
Lead Channel Setting Pacing Amplitude: 2.5 V
Lead Channel Setting Pacing Pulse Width: 0.4 ms
Lead Channel Setting Pacing Pulse Width: 0.4 ms
Lead Channel Setting Sensing Sensitivity: 0.5 mV
Pulse Gen Serial Number: 810000193

## 2020-12-10 MED ORDER — METOLAZONE 2.5 MG PO TABS: 2.5000 mg | ORAL_TABLET | Freq: Once | ORAL | 0 refills | Status: DC

## 2020-12-10 NOTE — Progress Notes (Signed)
Take 1 dose of metolazone 2.5 with am torsemide (one time).  BMET next week.

## 2020-12-10 NOTE — Progress Notes (Signed)
Spoke with patient.  Provided Dr Claris Gladden recommendation to take 1 time dose of Metolazone 2.5 mg with AM Torsemide.  She agreed to [facility] on 4/22.   She reports todays weight is 226 lbs.   Will recheck fluid levels on 12/15/2020 (manual send).

## 2020-12-15 ENCOUNTER — Ambulatory Visit (INDEPENDENT_AMBULATORY_CARE_PROVIDER_SITE_OTHER): Payer: Medicare Other

## 2020-12-15 ENCOUNTER — Telehealth: Payer: Self-pay

## 2020-12-15 DIAGNOSIS — Z95810 Presence of automatic (implantable) cardiac defibrillator: Secondary | ICD-10-CM

## 2020-12-15 DIAGNOSIS — I5022 Chronic systolic (congestive) heart failure: Secondary | ICD-10-CM | POA: Diagnosis not present

## 2020-12-15 NOTE — Telephone Encounter (Signed)
Spoke with patient and requested she send remote transmission to recheck fluid levels.  She said after taking Metolazone weight dropped to 213.1 lbs.

## 2020-12-15 NOTE — Progress Notes (Signed)
EPIC Encounter for ICM Monitoring  Patient Name: Elaine [name] is a 80 y.o. female Date: 12/15/2020 Primary Care Physican: Ginger Organ., MD Primary Cardiologist:Harding/McLean Electrophysiologist:[place] Bi-V Pacing:93% 11/24/2020 Weight: 214 lbs 4/13/2022Weight: 227 lbs 12/16/2019 Weight: 213 lbs   Spoke with patient and reports 14 lb weight loss after taking 1 time dose of Metolazone.  She is feeling much better.  She restricted fluids to 50 ounces a day after taking Metolazone.  Advised at last call for her to drink 64 oz a day especially if taking Metolazone.  She denies any dehydration symptoms.   CorVue thoracic impedancesuggesting dryness in response to 12/08/2020 Metolazone one time dose.      Prescribed:   Torsemide 20 mg take5tablets (100 mg total)every morning and 4 tablets (80 mg total) every evening.  Potassium 20 mEq 1 tablet twice a day  Labs: 11/23/2020 Creatinine 2.59, BUN 44, Potassium 4.1, Sodium 136, GFR 18 09/20/2020 Creatinine2.48, BUN42, Potassium4.1, [id number], GFR19 09/08/2020 Creatinine2.28, BUN38, Potassium4.2, [id number], GFR21  08/26/2020 Creatinine2.31, BUN28, Potassium4.0, [id number], GFR21  08/18/2020 Creatinine2.41, BUN31, Potassium4.1, [id number], LFY10 A complete set of results can be found in Results Review.  Recommendations: Advised to drink 74 oz fluid today and tomorrow as well as drink at least 20 oz of fluids prior to having labs on Friday 4/22 so she can get rehydrated.  Follow-up plan: ICM clinic phone appointment on4/28/2022 to recheck fluid levels. 91 day device clinic remote transmission7/13/2022.    EP/Cardiology Office Visits: 02/15/2021 for HF clinic.     Copy of ICM check sent to [facility].  3 month ICM trend: 12/15/2020.    1 Year ICM trend:       [name], RN 12/15/2020 3:41 PM

## 2020-12-17 ENCOUNTER — Ambulatory Visit (HOSPITAL_COMMUNITY)
Admission: RE | Admit: 2020-12-17 | Discharge: 2020-12-17 | Disposition: A | Discharge status: Home / Self Care | Payer: Medicare Other | Source: Ambulatory Visit | Attending: Cardiology | Admitting: Cardiology

## 2020-12-17 ENCOUNTER — Other Ambulatory Visit: Payer: Self-pay

## 2020-12-17 DIAGNOSIS — I5022 Chronic systolic (congestive) heart failure: Secondary | ICD-10-CM

## 2020-12-17 LAB — BASIC METABOLIC PANEL
Anion gap: 12 (ref 5–15)
BUN: 48 mg/dL — ABNORMAL HIGH (ref 8–23)
CO2: 31 mmol/L (ref 22–32)
Calcium: 9.9 mg/dL (ref 8.9–10.3)
Chloride: 93 mmol/L — ABNORMAL LOW (ref 98–111)
Creatinine, Ser: 2.35 mg/dL — ABNORMAL HIGH (ref 0.44–1.00)
GFR, Estimated: 20 mL/min — ABNORMAL LOW (ref >60)
Glucose, Bld: 111 mg/dL — ABNORMAL HIGH (ref 70–99)
Potassium: 3.8 mmol/L (ref 3.5–5.1)
Sodium: 136 mmol/L (ref 135–145)

## 2020-12-22 ENCOUNTER — Other Ambulatory Visit (HOSPITAL_COMMUNITY): Payer: Self-pay | Admitting: Family Medicine

## 2020-12-23 ENCOUNTER — Ambulatory Visit (INDEPENDENT_AMBULATORY_CARE_PROVIDER_SITE_OTHER): Payer: Medicare Other

## 2020-12-23 DIAGNOSIS — I5022 Chronic systolic (congestive) heart failure: Secondary | ICD-10-CM

## 2020-12-23 DIAGNOSIS — Z95810 Presence of automatic (implantable) cardiac defibrillator: Secondary | ICD-10-CM

## 2020-12-23 NOTE — Progress Notes (Signed)
Remote ICD transmission.   

## 2020-12-24 NOTE — Progress Notes (Signed)
EPIC Encounter for ICM Monitoring  Patient Name: Elaine [name] is a 80 y.o. female Date: 12/24/2020 Primary Care Physican: Ginger Organ., MD Primary Cardiologist:Harding/McLean Electrophysiologist:[place] Bi-V Pacing:93% 12/24/2020 Weight: 219 lbs   Spoke with patient andreports she is doing fine at this time.  Denies any current fluid symptoms.   CorVue thoracic impedancesuggesting fluid levels have returned to normal.     Prescribed:   Torsemide 20 mg take5tablets (100 mg total)every morning and 4 tablets (80 mg total) every evening.  Potassium 20 mEq 1 tablet twice a day  Labs: 11/23/2020 Creatinine 2.59, BUN 44, Potassium 4.1, Sodium 136, GFR 18 09/20/2020 Creatinine2.48, BUN42, Potassium4.1, [id number], GFR19 09/08/2020 Creatinine2.28, BUN38, Potassium4.2, [id number], GFR21  08/26/2020 Creatinine2.31, BUN28, Potassium4.0, [id number], GFR21  08/18/2020 Creatinine2.41, BUN31, Potassium4.1, [id number], TYO06 A complete set of results can be found in Results Review.  Recommendations: No changes and encouraged to call if experiencing any fluid symptoms.  Follow-up plan: ICM clinic phone appointment on6/01/2021. 91 day device clinic remote transmission7/13/2022.    EP/Cardiology Office Visits: 02/15/2021 for HF clinic.  Copy of ICM check sent to [facility].  3 month ICM trend: 12/23/2020.    1 Year ICM trend:       [name], RN 12/24/2020 10:58 AM

## 2021-01-31 ENCOUNTER — Ambulatory Visit (INDEPENDENT_AMBULATORY_CARE_PROVIDER_SITE_OTHER): Payer: Medicare Other

## 2021-01-31 DIAGNOSIS — Z95810 Presence of automatic (implantable) cardiac defibrillator: Secondary | ICD-10-CM | POA: Diagnosis not present

## 2021-01-31 DIAGNOSIS — I5022 Chronic systolic (congestive) heart failure: Secondary | ICD-10-CM

## 2021-02-03 ENCOUNTER — Telehealth: Payer: Self-pay

## 2021-02-03 NOTE — Telephone Encounter (Signed)
LMOVM for patient to send missed ICM transmission. I left the device clinic number to call back for help with the app.

## 2021-02-04 ENCOUNTER — Telehealth: Payer: Self-pay

## 2021-02-04 NOTE — Progress Notes (Signed)
EPIC Encounter for ICM Monitoring  Patient Name: Elaine [name] is a 80 y.o. female Date: 02/04/2021 Primary Care Physican: Elaine [name]., MD Primary Cardiologist: Elaine [name] Electrophysiologist: Elaine [name] Pacing: 94% 02/04/2021 Weight: 221.6 lbs                                                            Spoke with patient and reports she is doing fine at this time.  Denies any current fluid symptoms.    CorVue thoracic impedance suggesting normal fluid levels.        Prescribed:   Torsemide 20 mg take 5 tablets (100 mg total) every morning and 4 tablets (80 mg total) every evening.  Potassium 20 mEq 1 tablet twice a day   Labs: 11/23/2020 Creatinine 2.59, BUN 44, Potassium 4.1, Sodium 136, GFR 18 09/20/2020 Creatinine 2.48, BUN 42, Potassium 4.1, Sodium 138, GFR 19 09/08/2020 Creatinine 2.28, BUN 38, Potassium 4.2, Sodium 142, GFR 21 08/26/2020 Creatinine 2.31, BUN 28, Potassium 4.0, Sodium 137, GFR 21 08/18/2020 Creatinine 2.41, BUN 31, Potassium 4.1, Sodium 139, GFR 20  A complete set of results can be found in Results Review.   Recommendations:  No changes and encouraged to call if experiencing any fluid symptoms.   Follow-up plan: ICM clinic phone appointment on 03/10/2021.   91 day device clinic remote transmission 03/09/2021.     EP/Cardiology Office Visits:  02/15/2021 for HF clinic.       Copy of ICM check sent to Dr. Curt [name].   3 month ICM trend: 02/04/2021.    1 Year ICM trend:       Elaine Billings, RN 02/04/2021 1:42 PM

## 2021-02-04 NOTE — Telephone Encounter (Signed)
The patient called to get help setting up her mymerlinpulse app. I called tech support to get additional help. Transmission received.

## 2021-02-14 NOTE — Progress Notes (Signed)
ID:  Elaine [name], DOB [birth year], MRN [id number]  Provider location: [facility] Type of Visit: Established patient  PCP:  Marton Redwood, MD  Cardiologist:  Glenetta Hew, MD Primary HF: Dr. Aundra Dubin   History of Present Illness: Elaine [name] is a 80 y.o. with history of CAD s/p CABG, COPD, CKD stage 3, and chronic systolic systolic CHF/ischemic cardiomyopathy.  She was referred by Dr. Ellyn Hack for evaluation of CHF.  Patient was admitted in 2/15 with atrial fibrillation/RVR and chest pain.  Cath showed severe 3VD and she had CABG x 3.  Echo in 2/15 showed EF down to 20-25%. In 2018, it appears that she went back into atrial fibrillation and has remained in atrial fibrillation persistently.  DCCV was not attempted.   Most recent echo in 10/20 showed EF < 20% with normal RV.  She had a St Jude CRT-D device implanted in 11/20.  She says that she feels "90%" better with CRT.  She is followed by Dr. Hollie Salk with nephrology.   She has been in atrial fibrillation persistently, I attempted DCCV while on amiodarone but she did not convert.  Therefore, I stopped amiodarone.    Echo in 3/21 showed EF 20% with diffuse hypokinesis, mildly dilated RV with mildly decreased systolic function, PASP 49 mmHg, dilated IVC.  In 9/21, she had AV nodal ablation.   Today she returns for HF follow up. Last seen 3/22 with main complaint being neuropathy in her feet limiting her ambulation (chronic). Overall feeling fine. Denies increasing SOB, CP, dizziness, edema, or PND/Orthopnea. Appetite ok. No fever or chills. Weight at home ~226 pounds. Taking all medications. Drinking more fluids with increase in hot weather.  ECG (personally reviewed): atrial fibrillation, BiV pacing (personally reviewed).   ICD interrogation: CoreVue thoracic impedence stable (personally reviewed).  Labs (2/21): K 4.5, creatinine 2.08 => 2.99, hgb 12.8 Labs (7/21): K 4, creatinine 2.3, TSH normal Labs (12/21): K 4,  creatinine 2.31, BNP 671 Labs (1/22): K 4.1, creatinine 2.48  Labs (4/22): K 3.8, creatinine 2.35, HDL 64, LDL 77, TGs 66  PMH: 1. CAD: s/p CABG in 2/15 with LIMA-LAD, SVG-OM2, SVG-PLV.  2. LBBB: Chronic.  3. Atrial fibrillation: Persistent since 2018.  - LA appendage clip with CBGdldkl 4. H/o aspiration PNA 5. Type 2 diabetes 6. COPD 7. CKD stage 3 8. Chronic systolic CHF: Ischemic cardiomyopathy.  She has a St Jude BiV ICD.  - Echo (2/15): EF 20-25%.  - Echo (5/15): EF 40-45% - Echo (10/20): EF <20%, mild LVH, normal RV size and systolic function.  - Echo (3/21): EF 20% with diffuse hypokinesis, mildly dilated RV with mildly decreased systolic function, PASP 49 mmHg, dilated IVC.  - AV nodal ablation 9. CKD: Stage 3.   Social History   Socioeconomic History   Marital status: Widowed    Spouse name: Not on file   Number of children: Not on file   Years of education: Not on file   Highest education level: Not on file  Occupational History   Occupation: retired  Tobacco Use   Smoking status: Former    Packs/day: 0.50    Years: 39.00    Pack years: 19.50    Types: Cigarettes    Quit date: 08/28/1993    Years since quitting: 27.4   Smokeless tobacco: Never  Vaping Use   Vaping Use: Never used  Substance and Sexual Activity   Alcohol use: No    Comment: quit 95   Drug  use: No   Sexual activity: Not on file  Other Topics Concern   Not on file  Social History Narrative   Lives with husband and son in [place].  She is currently retired.   Former smoker quit in 1995.   Social Determinants of Health   Financial Resource Strain: Not on file  Food Insecurity: Not on file  Transportation Needs: Not on file  Physical Activity: Not on file  Stress: Not on file  Social Connections: Not on file  Intimate Partner Violence: Not on file   Family History  Adopted: Yes  Problem Relation Age of Onset   Other Mother    Hypertension Mother    Colon cancer Neg Hx     Esophageal cancer Neg Hx    Rectal cancer Neg Hx    Stomach cancer Neg Hx    Current Outpatient Medications  Medication Sig Dispense Refill   acetaminophen (TYLENOL) 650 MG CR tablet Take 1,300 mg by mouth 2 (two) times daily.     albuterol (VENTOLIN HFA) 108 (90 Base) MCG/ACT inhaler Inhale 2 puffs into the lungs every 6 (six) hours as needed for wheezing or shortness of breath.     apixaban (ELIQUIS) 5 MG TABS tablet Take 1 tablet (5 mg total) by mouth 2 (two) times daily. Resume Saturday evening. 60 tablet    Coenzyme Q10 (COQ-10) 100 MG CAPS Take 100 mg by mouth in the morning and at bedtime.      colchicine 0.6 MG tablet Take 0.6 mg by mouth daily as needed (Gout).      Dulaglutide (TRULICITY [place]) Inject 1 Dose into the skin once a week.     empagliflozin (JARDIANCE) 10 MG TABS tablet Take 1 tablet (10 mg total) by mouth daily before breakfast. 90 tablet 2   ezetimibe (ZETIA) 10 MG tablet Take 10 mg by mouth daily.     ferrous sulfate 325 (65 FE) MG tablet Take 325 mg by mouth every other day.     isosorbide-hydrALAZINE (BIDIL) 20-37.5 MG tablet Take 0.5 tablets by mouth in the morning, at noon, and at bedtime.     levothyroxine (SYNTHROID) 112 MCG tablet Take 1 tablet (112 mcg total) by mouth daily before breakfast. 30 tablet 12   metoprolol succinate (TOPROL-XL) 100 MG 24 hr tablet Take 1 tablet (100 mg total) by mouth every morning AND 0.5 tablets (50 mg total) every evening. Take with or immediately following a meal.. 135 tablet 3   Multiple Vitamin (MULTIVITAMIN WITH MINERALS) TABS tablet Take 1 tablet by mouth daily.     Multiple Vitamins-Minerals (PRESERVISION AREDS 2) CAPS Take 1 capsule by mouth 2 (two) times daily.     pantoprazole (PROTONIX) 40 MG tablet Take 40 mg by mouth daily.     potassium chloride SA (KLOR-CON) 20 MEQ tablet Take 1 tablet by mouth twice daily 60 tablet 11   rosuvastatin (CRESTOR) 5 MG tablet Take 5 mg by mouth every other day.      torsemide (DEMADEX) 20  MG tablet TAKE 5 TABLETS BY MOUTH IN THE MORNING, AND THEN TAKE 4 TABLETS BY MOUTH IN THE EVENING 270 tablet 6   umeclidinium-vilanterol (ANORO ELLIPTA) 62.5-25 MCG/INH AEPB Inhale 1 puff into the lungs daily.     No current facility-administered medications for this encounter.   Wt Readings from Last 3 Encounters:  02/15/21 102.1 kg (225 lb)  11/23/20 100.1 kg (220 lb 9.6 oz)  09/08/20 99.3 kg (219 lb)    BP (!) 104/50  Pulse 78   Wt 102.1 kg (225 lb)   SpO2 97%   BMI 41.15 kg/m  Exam:   General:  NAD. No resp difficulty, arrived in wheelchair, uses walker at home. HEENT: Normal Neck: Supple. JVP 6-7 cm. Carotids 2+ bilat; no bruits. No lymphadenopathy or thryomegaly appreciated. Cor: PMI nondisplaced. Irregular rate & rhythm. No rubs, gallops or murmurs. Lungs: Clear Abdomen: Obese, soft, nontender, nondistended. No hepatosplenomegaly. No bruits or masses. Good bowel sounds. Extremities: No cyanosis, clubbing, rash, trace LE edema Neuro: alert & oriented x 3, cranial nerves grossly intact. Moves all 4 extremities w/o difficulty. Affect pleasant.  Assessment/Plan: 1. Chronic systolic CHF: Ischemic cardiomyopathy.  St Jude CRT-D device, now s/p AV nodal ablation.  Echo in 3/21 showed EF 20% with diffuse hypokinesis, mildly dilated RV with mildly decreased systolic function, PASP 49 mmHg, dilated IVC.  NYHA class II-III symptoms, better since AVN ablation.  She does not look volume overloaded on exam.  Meds limited by CKD stage IV. Weight up 4 lbs, will do remote ICD transmission to follow fluid trend. - Continue torsemide 100 qam/80 bpm. BMET today.  - Continue empagliflozin 10 mg daily.   - She was unable to tolerate Bidil 1 tab tid, but she does fine with Bidil 1/2 tab tid so will continue.  - Continue Toprol XL 100 qam/50 qpm.   - Off spironolactone with side effects.  2. CAD: S/p CABG in 2015. No chest pain.  - No ASA given apixaban use.  - Continue Crestor 5 daily + Zetia,  lipids ok 3/22. 3. Atrial fibrillation: Chronic now, looks like it has gone on since 2018.  I was unable to cardiovert her on amiodarone so it was stopped.  I do not think that I will be able to get her out of atrial fibrillation. She is now s/p AVN ablation to allow more BiV pacing.  - Continue apixaban. No bleeding. CBC 3/22 ok.  4. CKD: Stage 3. BMET today.  Followup 3 months with Dr. Aundra Dubin.     Signed, Rafael Bihari, FNP  02/15/2021  [facility] [address] [facility] [place] [address] [phone] (office) [phone] (fax)

## 2021-02-15 ENCOUNTER — Other Ambulatory Visit: Payer: Self-pay

## 2021-02-15 ENCOUNTER — Encounter (HOSPITAL_COMMUNITY): Payer: Self-pay

## 2021-02-15 ENCOUNTER — Ambulatory Visit (HOSPITAL_COMMUNITY)
Admission: RE | Admit: 2021-02-15 | Discharge: 2021-02-15 | Disposition: A | Discharge status: Home / Self Care | Payer: Medicare Other | Source: Ambulatory Visit | Attending: Family Medicine | Admitting: Family Medicine

## 2021-02-15 VITALS — BP 104/50 | HR 78 | Wt 225.0 lb

## 2021-02-15 DIAGNOSIS — Z8249 Family history of ischemic heart disease and other diseases of the circulatory system: Secondary | ICD-10-CM | POA: Diagnosis not present

## 2021-02-15 DIAGNOSIS — I482 Chronic atrial fibrillation, unspecified: Secondary | ICD-10-CM | POA: Insufficient documentation

## 2021-02-15 DIAGNOSIS — I447 Left bundle-branch block, unspecified: Secondary | ICD-10-CM | POA: Diagnosis not present

## 2021-02-15 DIAGNOSIS — Z79899 Other long term (current) drug therapy: Secondary | ICD-10-CM | POA: Insufficient documentation

## 2021-02-15 DIAGNOSIS — E1122 Type 2 diabetes mellitus with diabetic chronic kidney disease: Secondary | ICD-10-CM | POA: Diagnosis not present

## 2021-02-15 DIAGNOSIS — Z87891 Personal history of nicotine dependence: Secondary | ICD-10-CM | POA: Insufficient documentation

## 2021-02-15 DIAGNOSIS — J449 Chronic obstructive pulmonary disease, unspecified: Secondary | ICD-10-CM | POA: Diagnosis not present

## 2021-02-15 DIAGNOSIS — Z7901 Long term (current) use of anticoagulants: Secondary | ICD-10-CM | POA: Insufficient documentation

## 2021-02-15 DIAGNOSIS — I4821 Permanent atrial fibrillation: Secondary | ICD-10-CM | POA: Diagnosis not present

## 2021-02-15 DIAGNOSIS — Z7984 Long term (current) use of oral hypoglycemic drugs: Secondary | ICD-10-CM | POA: Diagnosis not present

## 2021-02-15 DIAGNOSIS — Z951 Presence of aortocoronary bypass graft: Secondary | ICD-10-CM | POA: Insufficient documentation

## 2021-02-15 DIAGNOSIS — Z950 Presence of cardiac pacemaker: Secondary | ICD-10-CM | POA: Diagnosis not present

## 2021-02-15 DIAGNOSIS — I5042 Chronic combined systolic (congestive) and diastolic (congestive) heart failure: Secondary | ICD-10-CM | POA: Diagnosis present

## 2021-02-15 DIAGNOSIS — I255 Ischemic cardiomyopathy: Secondary | ICD-10-CM | POA: Diagnosis not present

## 2021-02-15 DIAGNOSIS — I2510 Atherosclerotic heart disease of native coronary artery without angina pectoris: Secondary | ICD-10-CM | POA: Diagnosis not present

## 2021-02-15 DIAGNOSIS — N1832 Chronic kidney disease, stage 3b: Secondary | ICD-10-CM

## 2021-02-15 DIAGNOSIS — N184 Chronic kidney disease, stage 4 (severe): Secondary | ICD-10-CM | POA: Diagnosis not present

## 2021-02-15 LAB — BASIC METABOLIC PANEL
Anion gap: 8 (ref 5–15)
BUN: 33 mg/dL — ABNORMAL HIGH (ref 8–23)
CO2: 29 mmol/L (ref 22–32)
Calcium: 10.2 mg/dL (ref 8.9–10.3)
Chloride: 99 mmol/L (ref 98–111)
Creatinine, Ser: 2.06 mg/dL — ABNORMAL HIGH (ref 0.44–1.00)
GFR, Estimated: 24 mL/min — ABNORMAL LOW (ref >60)
Glucose, Bld: 101 mg/dL — ABNORMAL HIGH (ref 70–99)
Potassium: 4.9 mmol/L (ref 3.5–5.1)
Sodium: 136 mmol/L (ref 135–145)

## 2021-02-15 NOTE — Patient Instructions (Signed)
It was great to see you today! No medication changes are needed at this time.  Labs today We will only contact you if something comes back abnormal or we need to make some changes. Otherwise no news is good news!  Your physician recommends that you schedule a follow-up appointment in: 3-4 months with Dr McLean  Do the following things EVERYDAY: Weigh yourself in the morning before breakfast. Write it down and keep it in a log. Take your medicines as prescribed Eat low salt foods--Limit salt (sodium) to 2000 mg per day.  Stay as active as you can everyday Limit all fluids for the day to less than 2 liters  At the [facility], you and your health needs are our priority. As part of our continuing mission to provide you with exceptional heart care, we have created designated Provider Care Teams. These Care Teams include your primary Cardiologist (physician) and Advanced Practice Providers (APPs- Physician Assistants and Nurse Practitioners) who all work together to provide you with the care you need, when you need it.   You may see any of the following providers on your designated Care Team at your next follow up: Dr Daniel Bensimhon Dr Dalton McLean Dr Brandon Winfrey Amy Clegg, NP Brittainy Simmons, PA Jessica Milford,NP Lauren Kemp, PharmD   Please be sure to bring in all your medications bottles to every appointment.    If you have any questions or concerns before your next appointment please send us a message through mychart or call our office at [phone].    TO LEAVE A MESSAGE FOR THE NURSE SELECT OPTION 2, PLEASE LEAVE A MESSAGE INCLUDING: YOUR NAME DATE OF BIRTH CALL BACK NUMBER REASON FOR CALL**this is important as we prioritize the call backs  YOU WILL RECEIVE A CALL BACK THE SAME DAY AS LONG AS YOU CALL BEFORE 4:00 PM   

## 2021-02-23 ENCOUNTER — Ambulatory Visit (INDEPENDENT_AMBULATORY_CARE_PROVIDER_SITE_OTHER): Payer: Medicare Other

## 2021-02-23 DIAGNOSIS — Z95810 Presence of automatic (implantable) cardiac defibrillator: Secondary | ICD-10-CM

## 2021-02-23 DIAGNOSIS — I5022 Chronic systolic (congestive) heart failure: Secondary | ICD-10-CM

## 2021-02-23 NOTE — Progress Notes (Signed)
EPIC Encounter for ICM Monitoring  Patient Name: Elaine [name] is a 80 y.o. female Date: 02/23/2021 Primary Care Physican: Ginger Organ., MD Primary Cardiologist: Harding/McLean Electrophysiologist: Cyndi Bender Pacing: 95% 02/04/2021 Weight: 221.6 lbs                                                            Transmission reviewed.   HF clinic requested remote transmission 1 week following 6/21 OV to follow fluid trend.   CorVue thoracic impedance normal fluid levels but was suggesting possible fluid accumulation 6/21-6/24.        Prescribed:   Torsemide 20 mg take 5 tablets (100 mg total) every morning and 4 tablets (80 mg total) every evening.  Potassium 20 mEq 1 tablet twice a day   Labs: 02/15/2021 Creatinine 2.06, BUN 33, Potassium 4.9, Sodium 136, GFR 24 12/17/2020 Creatinine 2.35, BUN 48, Potassium 3.8, Sodium 136, GFR 20 11/23/2020 Creatinine 2.59, BUN 44, Potassium 4.1, Sodium 136, GFR 18 A complete set of results can be found in Results Review.   Recommendations:  No changes.   Follow-up plan: ICM clinic phone appointment on 03/07/2021.   91 day device clinic remote transmission 03/09/2021.     EP/Cardiology Office Visits:  05/18/2021 with Dr Aundra Dubin.     Copy of ICM check sent to Dr. Curt Bears and Allena Katz, NP at [facility].    3 month ICM trend: 02/23/2021.    1 Year ICM trend:       [name], RN 02/23/2021 7:53 AM

## 2021-03-01 NOTE — Progress Notes (Signed)
Cardiology Office Note:    Date:  03/02/2021   ID:  Elaine [name], DOB August 17, 1941, MRN [id number]  PCP:  Ginger Organ., MD Referring MD: Ginger Organ., MD    [facility]  Cardiologist:  Glenetta Hew, MD   Reason for visit: 6 month follow-up  History of Present Illness:    Elaine [name] is a 80 y.o. female with a hx of A. fib status post AV node ablation with BiV ICD, CAD status post CABG, ischemic cardiomyopathy (hyperkalemia with spiro), CKD, hyperlipidemia, hypertension, DM and chronic LE edema (combination of CHF, lymphedema and venous stasis) who presents for 6 month follow-up.    She last saw Dr. Ellyn Hack in January 2020.  At that visit, he noted  her activity was limited by her legs giving out and her chronic dyspnea.  She saw Dr. Aundra Dubin with CHF team on 11/23/2020 who noted she was euvolemic on torsemide 100mg  AM/ 80mg  PM.  Today, patient comes in with her son.  She is doing well from a CV standpoint.  She discusses how she takes her health seriously and closely monitors her fluid intake and urine output and avoiding salt.  She is religious about her medications.  Her sons keep a close on her. At 23 years old, she is still able to drive and do her shopping.  Her only complaint is lack of taste that started in July last year.  She denies chest pain, shortness of breath, syncope, orthopnea, PND or significant pedal edema. She states her BP is typically 110/60.  She has chronic LE edema. She wears compression socks and elevates her legs at home.  She weighs herself daily and states weight at home 225lb today which seems to be her dry weight.  No bleeding issues on Eliquis.  No recent falls or lightheadedness.   PMH: 1. CAD: s/p CABG in 2/15 with LIMA-LAD, SVG-OM2, SVG-PLV. 2. LBBB: Chronic. 3. Atrial fibrillation: Persistent since 2018. - LA appendage clip with CBGdldkl 4. H/o aspiration PNA 5. Type 2 diabetes 6. COPD 7. CKD stage 3 8. Chronic  systolic CHF: Ischemic cardiomyopathy.  She has a [employer] BiV ICD. - Echo (2/15): EF 20-25%. - Echo (5/15): EF 40-45% - Echo (10/20): EF <20%, mild LVH, normal RV size and systolic function. - Echo (3/21): EF 20% with diffuse hypokinesis, mildly dilated RV with mildly decreased systolic function, PASP 49 mmHg, dilated IVC. - AV nodal ablation 9. CKD: Stage 3.   Past Surgical History:  Procedure Laterality Date   ABDOMINAL HYSTERECTOMY     AV NODE ABLATION N/A 05/19/2020   Procedure: AV NODE ABLATION;  Surgeon: Constance Haw, MD;  Location: [place] [facility];  Service: Cardiovascular;  Laterality: N/A;   BIV ICD INSERTION CRT-D N/A 07/28/2019   Procedure: BIV ICD INSERTION CRT-D;  Surgeon: Constance Haw, MD;  Location: [place] [facility];  Service: Cardiovascular;  Laterality: N/A;   BREAST BIOPSY Right 08/04/2016    fibroadenoma with calcifications    BREAST BIOPSY Right 07/16/2014   fibroadenoma with calcifications and atypical   BREAST EXCISIONAL BIOPSY Right 10/06/2014   atypical lobular hyperplasia    BREAST LUMPECTOMY W/ NEEDLE LOCALIZATION Right 10/06/2014   Dr Georgette Dover   BREAST LUMPECTOMY WITH NEEDLE LOCALIZATION Right 10/06/2014   Procedure: RIGHT BREAST LUMPECTOMY WITH NEEDLE LOCALIZATION;  Surgeon: Donnie Mesa, MD;  Location: [place];  Service: General;  Laterality: Right;   CARDIOVERSION N/A 10/22/2019   Procedure:  CARDIOVERSION;  Surgeon: Larey Dresser, MD;  [facility] ENDOS[facility];  Service: Cardiovascular;  Laterality: N/A;   CLIPPING OF ATRIAL APPENDAGE N/A 10/23/2013   Procedure: CLIPPING OF ATRIAL APPENDAGE;  Surgeon: Rexene Alberts, MD;  Location: [place];  Service: Open Heart Surgery;  Laterality: N/A;   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   CORONARY ARTERY BYPASS GRAFT N/A 10/23/2013   Procedure: CORONARY ARTERY BYPASS GRAFTING (CABG);  Surgeon: Rexene Alberts, MD;  Location: [place];  Service: Open Heart Surgery: LIMA-LAD, SVG-OM2, SVG-RPL   INTRAOPERATIVE  TRANSESOPHAGEAL ECHOCARDIOGRAM N/A 10/23/2013   Procedure: INTRAOPERATIVE TRANSESOPHAGEAL ECHOCARDIOGRAM;  Surgeon: Rexene Alberts, MD;  Location: [place];  Service: Open Heart Surgery;  Laterality: N/A;   LEFT HEART CATHETERIZATION WITH CORONARY ANGIOGRAM N/A 10/20/2013   Procedure: LEFT HEART CATHETERIZATION WITH CORONARY ANGIOGRAM;  Surgeon: Leonie Man, MD;  Location: [facility] CATH LAB;  Service: Cardiovascular: Distal LM ~70%, LAD - ostial 70%, prox 80, 95 & 95%; RCA prox 70%, mid 80%; minimal Cx disease   LEFT HEART CATHETERIZATION WITH CORONARY/GRAFT ANGIOGRAM N/A 09/08/2014   Procedure: LEFT HEART CATHETERIZATION WITH Beatrix Fetters;  Surgeon: Leonie Man, MD;  Location: [facility] CATH LAB: Indication: Abnormal Myoview. Occluded LAD after 70 and 80% left main. Diffuse RCA disease. Widely patent grafts to Moderately diseased artery vessels. Mild-moderate LV dysfunction and chronic A. fib.   NM MYOVIEW LTD  08/14/2014   Lexi scan: INTERMEDIATE RISK - large, severe intensity perfusion defect in anterior wall, apex and inferior apex that is partly reversible. This suggests either scar or possible hibernating myocardium. Nondeviated.-> Likely scar. No change in coronary anatomy with patent grafts.   TOTAL HIP ARTHROPLASTY  2010, 2011   right 2010, left 2011   TRANSTHORACIC ECHOCARDIOGRAM  12/2013   Mildly dilated left ventricle with mild to moderately reduced function. EF 40-45% (notable improvement from February 2015). Septal and apical hypokinesis. Mild LA dilation.   TRANSTHORACIC ECHOCARDIOGRAM  11/11/2019   EF 20% with severely dysfunction. Global HK. Unable to determine diastolic pressures. Mild RV function-with mildly elevated PAP-RAP estimated 15 mmHg.. Moderate LA dilation, mild RA dilation.    Current Medications: Current Meds  Medication Sig   acetaminophen (TYLENOL) 650 MG CR tablet Take 1,300 mg by mouth 2 (two) times daily.   albuterol (VENTOLIN HFA) 108 (90 Base) MCG/ACT  inhaler Inhale 2 puffs into the lungs every 6 (six) hours as needed for wheezing or shortness of breath.   apixaban (ELIQUIS) 5 MG TABS tablet Take 1 tablet (5 mg total) by mouth 2 (two) times daily. Resume Saturday evening.   Coenzyme Q10 (COQ-10) 100 MG CAPS Take 100 mg by mouth in the morning and at bedtime.    colchicine 0.6 MG tablet Take 0.6 mg by mouth daily as needed (Gout).    Dulaglutide (TRULICITY [place]) Inject 1 Dose into the skin once a week.   ezetimibe (ZETIA) 10 MG tablet Take 10 mg by mouth daily.   ferrous sulfate 325 (65 FE) MG tablet Take 325 mg by mouth every other day.   isosorbide-hydrALAZINE (BIDIL) 20-37.5 MG tablet Take 0.5 tablets by mouth in the morning, at noon, and at bedtime.   levothyroxine (SYNTHROID) 112 MCG tablet Take 1 tablet (112 mcg total) by mouth daily before breakfast.   metoprolol succinate (TOPROL-XL) 100 MG 24 hr tablet Take 1 tablet (100 mg total) by mouth every morning AND 0.5 tablets (50 mg total) every evening. Take with or immediately following a meal..  Multiple Vitamin (MULTIVITAMIN WITH MINERALS) TABS tablet Take 1 tablet by mouth daily.   Multiple Vitamins-Minerals (PRESERVISION AREDS 2) CAPS Take 1 capsule by mouth 2 (two) times daily.   pantoprazole (PROTONIX) 40 MG tablet Take 40 mg by mouth daily.   potassium chloride SA (KLOR-CON) 20 MEQ tablet Take 1 tablet by mouth twice daily   rosuvastatin (CRESTOR) 5 MG tablet Take 5 mg by mouth every other day.    torsemide (DEMADEX) 20 MG tablet TAKE 5 TABLETS BY MOUTH IN THE MORNING, AND THEN TAKE 4 TABLETS BY MOUTH IN THE EVENING   umeclidinium-vilanterol (ANORO ELLIPTA) 62.5-25 MCG/INH AEPB Inhale 1 puff into the lungs daily.   [DISCONTINUED] empagliflozin (JARDIANCE) 10 MG TABS tablet Take 1 tablet (10 mg total) by mouth daily before breakfast.     Allergies:   Crestor [rosuvastatin calcium], Allopurinol, and Tape   Social History   Socioeconomic History   Marital status: Widowed    Spouse  name: Not on file   Number of children: Not on file   Years of education: Not on file   Highest education level: Not on file  Occupational History   Occupation: retired  Tobacco Use   Smoking status: Former    Packs/day: 0.50    Years: 39.00    Pack years: 19.50    Types: Cigarettes    Quit date: 08/28/1993    Years since quitting: 27.5   Smokeless tobacco: Never  Vaping Use   Vaping Use: Never used  Substance and Sexual Activity   Alcohol use: No    Comment: quit 95   Drug use: No   Sexual activity: Not on file  Other Topics Concern   Not on file  Social History Narrative   Lives with husband and son in [place].  She is currently retired.   Former smoker quit in 1995.   Social Determinants of Health   Financial Resource Strain: Not on file  Food Insecurity: Not on file  Transportation Needs: Not on file  Physical Activity: Not on file  Stress: Not on file  Social Connections: Not on file     Family History: The patient's family history includes Hypertension in her mother; Other in her mother. There is no history of Colon cancer, Esophageal cancer, Rectal cancer, or Stomach cancer. She was adopted.  ROS:   Please see the history of present illness.     EKGs/Labs/Other Studies Reviewed:    EKG:  None  Recent Labs: 03/09/2020: ALT 34; Magnesium 2.1; TSH 1.780 08/18/2020: B Natriuretic Peptide 671.1 11/23/2020: Hemoglobin 13.3; Platelets 207 02/15/2021: BUN 33; Creatinine, Ser 2.06; Potassium 4.9; Sodium 136  Recent Lipid Panel    Component Value Date/Time   CHOL 154 11/23/2020 1128   TRIG 66 11/23/2020 1128   HDL 64 11/23/2020 1128   CHOLHDL 2.4 11/23/2020 1128   VLDL 13 11/23/2020 1128   LDLCALC 77 11/23/2020 1128    Physical Exam:    VS:  BP (!) 100/40 (BP Location: Right Arm)   Pulse 81   Ht 5\' 2"  (1.575 m)   Wt 225 lb (102.1 kg)   SpO2 96%   BMI 41.15 kg/m     Wt Readings from Last 3 Encounters:  03/02/21 225 lb (102.1 kg)  02/15/21 225 lb  (102.1 kg)  11/23/20 220 lb 9.6 oz (100.1 kg)     GEN:  Well nourished, well developed in no acute distress, elderly in a wheelchair HEENT: Normal NECK: JVD at clavicle at 90 degrees;  No carotid bruits CARDIAC: irreg irreg no murmurs, rubs, gallops RESPIRATORY:  Clear to auscultation without rales, wheezing or rhonchi  ABDOMEN: Soft, non-tender, non-distended MUSCULOSKELETAL: 1+ bilateral LE edema to mid shin bilaterally  SKIN: Warm and dry NEUROLOGIC:  Alert and oriented PSYCHIATRIC:  Normal affect   ASSESSMENT AND PLAN   1. Coronary artery disease involving native heart without angina pectoris, unspecified vessel or lesion type - No angina.  Continue healthy diet & activity. - Continue Toprol & statin.  No ASA given Eliquis.  2. Permanent atrial fibrillation (HCC) - CHA2DS2-VASc Score 6, on Eliquis - s/p AV node ablation, v-paced. - Continue eliquis for stroke prevention.  3. Hyperlipidemia with target LDL less than 70 - LDL 77 in 10/2020. - Continue Crestor 5mg  every other day (she had worse hand cramping on daily dosing; she has tolerable hand cramping on every other day dosing).  Continue Zetia.  Since her LDL is close to goal and she has a healthy diet & remains active, I would not add any additional medications at this time.  4. Essential hypertension, well controlled - Continue current medications. Avoid salt as she is doing.  5. Chronic combined systolic and diastolic CHF s/p BiV ICD, euvolemic - Managed by CHF team, has appt in Sept 2022.  Not on ACE/ARB 2/2 CKD. - Refilled Jardiance for her.  Disposition: Follow-up with Dr. Ellyn Hack in 6 months.        Medication Adjustments/Labs and Tests Ordered: Current medicines are reviewed at length with the patient today.  Concerns regarding medicines are outlined above.  No orders of the defined types were placed in this encounter.  Meds ordered this encounter  Medications   empagliflozin (JARDIANCE) 10 MG TABS tablet     Sig: Take 1 tablet (10 mg total) by mouth daily before breakfast.    Dispense:  90 tablet    Refill:  2    Patient Instructions  Medication Instructions:  No changes *If you need a refill on your cardiac medications before your next appointment, please call your pharmacy*   Lab Work: No Labs If you have labs (blood work) drawn today and your tests are completely normal, you will receive your results only by: [place] (if you have MyChart) OR A paper copy in the mail If you have any lab test that is abnormal or we need to change your treatment, we will call you to review the results.   Testing/Procedures: No Testing   Follow-Up: At [facility], you and your health needs are our priority.  As part of our continuing mission to provide you with exceptional heart care, we have created designated Provider Care Teams.  These Care Teams include your primary Cardiologist (physician) and Advanced Practice Providers (APPs -  Physician Assistants and Nurse Practitioners) who all work together to provide you with the care you need, when you need it.  We recommend signing up for the patient portal called "MyChart".  Sign up information is provided on this After Visit Summary.  MyChart is used to connect with patients for Virtual Visits (Telemedicine).  Patients are able to view lab/test results, encounter notes, upcoming appointments, etc.  Non-urgent messages can be sent to your provider as well.   To learn more about what you can do with MyChart, go to [email or web address].    Your next appointment:   6 month(s)  The format for your next appointment:   In Person  Provider:   Glenetta Hew, MD  Signed, Warren Lacy, PA-C  03/02/2021 12:04 PM    [facility]

## 2021-03-02 ENCOUNTER — Other Ambulatory Visit: Payer: Self-pay

## 2021-03-02 ENCOUNTER — Ambulatory Visit (INDEPENDENT_AMBULATORY_CARE_PROVIDER_SITE_OTHER): Payer: Medicare Other | Admitting: Physician Assistant

## 2021-03-02 ENCOUNTER — Encounter: Payer: Self-pay | Admitting: Medical

## 2021-03-02 VITALS — BP 100/40 | HR 81 | Ht 62.0 in | Wt 225.0 lb

## 2021-03-02 DIAGNOSIS — I2510 Atherosclerotic heart disease of native coronary artery without angina pectoris: Secondary | ICD-10-CM | POA: Diagnosis not present

## 2021-03-02 DIAGNOSIS — E785 Hyperlipidemia, unspecified: Secondary | ICD-10-CM

## 2021-03-02 DIAGNOSIS — I4821 Permanent atrial fibrillation: Secondary | ICD-10-CM

## 2021-03-02 DIAGNOSIS — I10 Essential (primary) hypertension: Secondary | ICD-10-CM | POA: Diagnosis not present

## 2021-03-02 DIAGNOSIS — I5042 Chronic combined systolic (congestive) and diastolic (congestive) heart failure: Secondary | ICD-10-CM

## 2021-03-02 MED ORDER — EMPAGLIFLOZIN 10 MG PO TABS: 10.0000 mg | ORAL_TABLET | Freq: Every day | ORAL | 2 refills | Status: DC

## 2021-03-02 NOTE — Patient Instructions (Signed)
Medication Instructions:  No changes *If you need a refill on your cardiac medications before your next appointment, please call your pharmacy*   Lab Work: No Labs If you have labs (blood work) drawn today and your tests are completely normal, you will receive your results only by: [place] (if you have MyChart) OR A paper copy in the mail If you have any lab test that is abnormal or we need to change your treatment, we will call you to review the results.   Testing/Procedures: No Testing   Follow-Up: At [facility], you and your health needs are our priority.  As part of our continuing mission to provide you with exceptional heart care, we have created designated Provider Care Teams.  These Care Teams include your primary Cardiologist (physician) and Advanced Practice Providers (APPs -  Physician Assistants and Nurse Practitioners) who all work together to provide you with the care you need, when you need it.  We recommend signing up for the patient portal called "MyChart".  Sign up information is provided on this After Visit Summary.  MyChart is used to connect with patients for Virtual Visits (Telemedicine).  Patients are able to view lab/test results, encounter notes, upcoming appointments, etc.  Non-urgent messages can be sent to your provider as well.   To learn more about what you can do with MyChart, go to [email or web address].    Your next appointment:   6 month(s)  The format for your next appointment:   In Person  Provider:   Glenetta Hew, MD

## 2021-03-07 ENCOUNTER — Ambulatory Visit (INDEPENDENT_AMBULATORY_CARE_PROVIDER_SITE_OTHER): Payer: Medicare Other

## 2021-03-07 DIAGNOSIS — I5022 Chronic systolic (congestive) heart failure: Secondary | ICD-10-CM | POA: Diagnosis not present

## 2021-03-07 DIAGNOSIS — Z95810 Presence of automatic (implantable) cardiac defibrillator: Secondary | ICD-10-CM | POA: Diagnosis not present

## 2021-03-09 ENCOUNTER — Ambulatory Visit (INDEPENDENT_AMBULATORY_CARE_PROVIDER_SITE_OTHER): Payer: Medicare Other

## 2021-03-09 DIAGNOSIS — I255 Ischemic cardiomyopathy: Secondary | ICD-10-CM

## 2021-03-10 ENCOUNTER — Telehealth: Payer: Self-pay

## 2021-03-10 NOTE — Progress Notes (Signed)
EPIC Encounter for ICM Monitoring  Patient Name: Elaine [name] is a 80 y.o. female Date: 03/10/2021 Primary Care Physican: Ginger Organ., MD Primary Cardiologist: Harding/[place] Electrophysiologist: Cyndi Bender Pacing: 95% 02/04/2021 Weight: 221.6 lbs                                                            Attempted call to patient and unable to reach.  Left detailed message per DPR regarding transmission. Transmission reviewed.    CorVue thoracic impedance suggesting normal fluid levels.        Prescribed:   Torsemide 20 mg take 5 tablets (100 mg total) every morning and 4 tablets (80 mg total) every evening.  Potassium 20 mEq 1 tablet twice a day   Labs: 02/15/2021 Creatinine 2.06, BUN 33, Potassium 4.9, Sodium 136, GFR 24 12/17/2020 Creatinine 2.35, BUN 48, Potassium 3.8, Sodium 136, GFR 20 11/23/2020 Creatinine 2.59, BUN 44, Potassium 4.1, Sodium 136, GFR 18 A complete set of results can be found in Results Review.   Recommendations:  Left voice mail with ICM number and encouraged to call if experiencing any fluid symptoms.   Follow-up plan: ICM clinic phone appointment on 04/11/2021.   91 day device clinic remote transmission 06/08/2021.     EP/Cardiology Office Visits:  05/18/2021 with Dr Aundra Dubin.       Copy of ICM check sent to Dr. Curt Bears.   3 month ICM trend: 03/07/2021.    1 Year ICM trend:       [name], RN 03/10/2021 8:47 AM

## 2021-03-10 NOTE — Telephone Encounter (Signed)
Remote ICM transmission received.  Attempted call to patient regarding ICM remote transmission and left detailed message per DPR.  Advised to return call for any fluid symptoms or questions. Next ICM remote transmission scheduled 04/11/2021.

## 2021-03-11 LAB — CUP PACEART REMOTE DEVICE CHECK
Battery Remaining Longevity: 71 mo
Battery Remaining Percentage: 76 %
Battery Voltage: 2.98 V
Date Time Interrogation Session: 20220714183549
HighPow Impedance: 59 Ohm
Implantable Lead Implant Date: 20201130
Implantable Lead Implant Date: 20201130
Implantable Lead Location: 753858
Implantable Lead Location: 753860
Implantable Pulse Generator Implant Date: 20201130
Lead Channel Impedance Value: 500 Ohm
Lead Channel Impedance Value: 530 Ohm
Lead Channel Pacing Threshold Amplitude: 0.5 V
Lead Channel Pacing Threshold Amplitude: 0.875 V
Lead Channel Pacing Threshold Pulse Width: 0.4 ms
Lead Channel Pacing Threshold Pulse Width: 0.4 ms
Lead Channel Sensing Intrinsic Amplitude: 12 mV
Lead Channel Setting Pacing Amplitude: 1.875
Lead Channel Setting Pacing Amplitude: 2.5 V
Lead Channel Setting Pacing Pulse Width: 0.4 ms
Lead Channel Setting Pacing Pulse Width: 0.4 ms
Lead Channel Setting Sensing Sensitivity: 0.5 mV
Pulse Gen Serial Number: 810000193

## 2021-04-01 NOTE — Progress Notes (Signed)
Remote ICD transmission.   

## 2021-04-11 ENCOUNTER — Ambulatory Visit (INDEPENDENT_AMBULATORY_CARE_PROVIDER_SITE_OTHER): Payer: Medicare Other

## 2021-04-11 DIAGNOSIS — Z95810 Presence of automatic (implantable) cardiac defibrillator: Secondary | ICD-10-CM

## 2021-04-11 DIAGNOSIS — I5022 Chronic systolic (congestive) heart failure: Secondary | ICD-10-CM | POA: Diagnosis not present

## 2021-04-12 NOTE — Progress Notes (Signed)
EPIC Encounter for ICM Monitoring  Patient Name: Elaine [name] is a 80 y.o. female Date: 04/12/2021 Primary Care Physican: Ginger Organ., MD Primary Cardiologist: [facility]ing/[facility] Electrophysiologist: Cyndi Bender Pacing: 95% 04/12/2021 Weight: 219.5 lbs                                                            Spoke with patient and heart failure questions reviewed.  Pt asymptomatic for fluid accumulation and feeling well.   CorVue thoracic impedance suggesting normal fluid levels.        Prescribed:   Torsemide 20 mg take 5 tablets (100 mg total) every morning and 4 tablets (80 mg total) every evening.  Potassium 20 mEq 1 tablet twice a day   Labs: 02/15/2021 Creatinine 2.06, BUN 33, Potassium 4.9, Sodium 136, GFR 24 12/17/2020 Creatinine 2.35, BUN 48, Potassium 3.8, Sodium 136, GFR 20 11/23/2020 Creatinine 2.59, BUN 44, Potassium 4.1, Sodium 136, GFR 18 A complete set of results can be found in Results Review.   Recommendations:  No changes and encouraged to call if experiencing any fluid symptoms.   Follow-up plan: ICM clinic phone appointment on 05/16/2021.   91 day device clinic remote transmission 06/08/2021.     EP/Cardiology Office Visits:  05/18/2021 with Dr Aundra Dubin.       Copy of ICM check sent to Dr. Curt Bears.   3 month ICM trend: 04/11/2021.    1 Year ICM trend:       [name], RN 04/12/2021 4:39 PM

## 2021-05-16 ENCOUNTER — Ambulatory Visit (INDEPENDENT_AMBULATORY_CARE_PROVIDER_SITE_OTHER): Payer: Medicare Other

## 2021-05-16 DIAGNOSIS — I5022 Chronic systolic (congestive) heart failure: Secondary | ICD-10-CM | POA: Diagnosis not present

## 2021-05-16 DIAGNOSIS — Z95810 Presence of automatic (implantable) cardiac defibrillator: Secondary | ICD-10-CM

## 2021-05-17 NOTE — Progress Notes (Signed)
EPIC Encounter for ICM Monitoring  Patient Name: Elaine [name] is a 80 y.o. female Date: 05/17/2021 Primary Care Physican: Ginger Organ., MD Primary Cardiologist: [facility]ing/[facility] Electrophysiologist: Cyndi Bender Pacing: 96% 04/12/2021 Weight: 219.5 lbs 05/17/2021 Weight: 217.8 lbs                                                            Spoke with patient and heart failure questions reviewed.  Pt asymptomatic for fluid accumulation and feeling well.   CorVue thoracic impedance suggesting normal fluid levels.        Prescribed:   Torsemide 20 mg take 5 tablets (100 mg total) every morning and 4 tablets (80 mg total) every evening.  Potassium 20 mEq 1 tablet twice a day   Labs: 02/15/2021 Creatinine 2.06, BUN 33, Potassium 4.9, Sodium 136, GFR 24 12/17/2020 Creatinine 2.35, BUN 48, Potassium 3.8, Sodium 136, GFR 20 11/23/2020 Creatinine 2.59, BUN 44, Potassium 4.1, Sodium 136, GFR 18 A complete set of results can be found in Results Review.   Recommendations:  No changes and encouraged to call if experiencing any fluid symptoms.   Follow-up plan: ICM clinic phone appointment on 06/20/2021.   91 day device clinic remote transmission 06/08/2021.     EP/Cardiology Office Visits:  05/18/2021 with Dr Aundra Dubin.   06/23/2021 with Dr Curt Bears.   Copy of ICM check sent to Dr. Curt Bears.    3 month ICM trend: 05/16/2021.    1 Year ICM trend:       [name], RN 05/17/2021 10:48 AM

## 2021-05-18 ENCOUNTER — Other Ambulatory Visit: Payer: Self-pay

## 2021-05-18 ENCOUNTER — Encounter (HOSPITAL_COMMUNITY): Payer: Self-pay | Admitting: Cardiology

## 2021-05-18 ENCOUNTER — Ambulatory Visit (HOSPITAL_COMMUNITY)
Admission: RE | Admit: 2021-05-18 | Discharge: 2021-05-18 | Disposition: A | Discharge status: Home / Self Care | Payer: Medicare Other | Source: Ambulatory Visit | Attending: Cardiology | Admitting: Cardiology

## 2021-05-18 VITALS — BP 110/70 | HR 81 | Wt 225.6 lb

## 2021-05-18 DIAGNOSIS — Z87891 Personal history of nicotine dependence: Secondary | ICD-10-CM | POA: Insufficient documentation

## 2021-05-18 DIAGNOSIS — J449 Chronic obstructive pulmonary disease, unspecified: Secondary | ICD-10-CM | POA: Insufficient documentation

## 2021-05-18 DIAGNOSIS — Z8249 Family history of ischemic heart disease and other diseases of the circulatory system: Secondary | ICD-10-CM | POA: Insufficient documentation

## 2021-05-18 DIAGNOSIS — Z7901 Long term (current) use of anticoagulants: Secondary | ICD-10-CM | POA: Insufficient documentation

## 2021-05-18 DIAGNOSIS — Z79890 Hormone replacement therapy (postmenopausal): Secondary | ICD-10-CM | POA: Diagnosis not present

## 2021-05-18 DIAGNOSIS — Z79899 Other long term (current) drug therapy: Secondary | ICD-10-CM | POA: Insufficient documentation

## 2021-05-18 DIAGNOSIS — I2510 Atherosclerotic heart disease of native coronary artery without angina pectoris: Secondary | ICD-10-CM | POA: Diagnosis not present

## 2021-05-18 DIAGNOSIS — I255 Ischemic cardiomyopathy: Secondary | ICD-10-CM | POA: Diagnosis not present

## 2021-05-18 DIAGNOSIS — N184 Chronic kidney disease, stage 4 (severe): Secondary | ICD-10-CM | POA: Diagnosis not present

## 2021-05-18 DIAGNOSIS — I482 Chronic atrial fibrillation, unspecified: Secondary | ICD-10-CM | POA: Diagnosis not present

## 2021-05-18 DIAGNOSIS — Z951 Presence of aortocoronary bypass graft: Secondary | ICD-10-CM | POA: Insufficient documentation

## 2021-05-18 DIAGNOSIS — I5022 Chronic systolic (congestive) heart failure: Secondary | ICD-10-CM | POA: Insufficient documentation

## 2021-05-18 DIAGNOSIS — Z7984 Long term (current) use of oral hypoglycemic drugs: Secondary | ICD-10-CM | POA: Diagnosis not present

## 2021-05-18 DIAGNOSIS — I5042 Chronic combined systolic (congestive) and diastolic (congestive) heart failure: Secondary | ICD-10-CM

## 2021-05-18 LAB — BASIC METABOLIC PANEL
Anion gap: 11 (ref 5–15)
BUN: 38 mg/dL — ABNORMAL HIGH (ref 8–23)
CO2: 28 mmol/L (ref 22–32)
Calcium: 10 mg/dL (ref 8.9–10.3)
Chloride: 97 mmol/L — ABNORMAL LOW (ref 98–111)
Creatinine, Ser: 2.23 mg/dL — ABNORMAL HIGH (ref 0.44–1.00)
GFR, Estimated: 22 mL/min — ABNORMAL LOW (ref >60)
Glucose, Bld: 108 mg/dL — ABNORMAL HIGH (ref 70–99)
Potassium: 5 mmol/L (ref 3.5–5.1)
Sodium: 136 mmol/L (ref 135–145)

## 2021-05-18 LAB — BRAIN NATRIURETIC PEPTIDE: B Natriuretic Peptide: 277.1 pg/mL — ABNORMAL HIGH (ref 0.0–100.0)

## 2021-05-18 MED ORDER — TORSEMIDE 100 MG PO TABS: 100.0000 mg | ORAL_TABLET | Freq: Two times a day (BID) | ORAL | 3 refills | Status: DC

## 2021-05-18 NOTE — Progress Notes (Signed)
ID:  Elaine [name], DOB [birth year], MRN [id number]  Provider location: [facility] Type of Visit: Established patient  PCP:  Marton Redwood, MD  Cardiologist:  Glenetta Hew, MD Primary HF: Dr. Aundra Dubin   History of Present Illness: Elaine [name] is a 80 y.o. with history of CAD s/p CABG, COPD, CKD stage 3, and chronic systolic systolic CHF/ischemic cardiomyopathy.  She was referred by Dr. Ellyn Hack for evaluation of CHF.  Patient was admitted in 2/15 with atrial fibrillation/RVR and chest pain.  Cath showed severe 3VD and she had CABG x 3.  Echo in 2/15 showed EF down to 20-25%. In 2018, it appears that she went back into atrial fibrillation and has remained in atrial fibrillation persistently.  DCCV was not attempted.   Most recent echo in 10/20 showed EF < 20% with normal RV.  She had a St Jude CRT-D device implanted in 11/20.  She says that she feels "90%" better with CRT.  She is followed by Dr. Hollie Salk with nephrology.   She has been in atrial fibrillation persistently, I attempted DCCV while on amiodarone but she did not convert.  Therefore, I stopped amiodarone.    Echo in 3/21 showed EF 20% with diffuse hypokinesis, mildly dilated RV with mildly decreased systolic function, PASP 49 mmHg, dilated IVC.  In 9/21, she had AV nodal ablation.   She returns now for followup of CHF.  Ambulation is limited by neuropathy and ankle pain/arthritis.  She can walk about 40 feet with mild dyspnea. No lightheadedness.  No chest pain.  No orthopnea/PND.  Weight is stable.      Labs (2/21): K 4.5, creatinine 2.08 => 2.99, hgb 12.8 Labs (7/21): K 4, creatinine 2.3, TSH normal Labs (12/21): K 4, creatinine 2.31, BNP 671 Labs (1/22): K 4.1, creatinine 2.48  Labs (3/22): LDL 77 Labs (6/22): K 4.9, creatinine 2.26  PMH: 1. CAD: s/p CABG in 2/15 with LIMA-LAD, SVG-OM2, SVG-PLV.  2. LBBB: Chronic.  3. Atrial fibrillation: Persistent since 2018.  - LA appendage clip with CBGdldkl 4. H/o  aspiration PNA 5. Type 2 diabetes 6. COPD 7. CKD stage 3 8. Chronic systolic CHF: Ischemic cardiomyopathy.  She has a St Jude BiV ICD.  - Echo (2/15): EF 20-25%.  - Echo (5/15): EF 40-45% - Echo (10/20): EF <20%, mild LVH, normal RV size and systolic function.  - Echo (3/21): EF 20% with diffuse hypokinesis, mildly dilated RV with mildly decreased systolic function, PASP 49 mmHg, dilated IVC.  - AV nodal ablation 9. CKD: Stage 3.   Social History   Socioeconomic History   Marital status: Widowed    Spouse name: Not on file   Number of children: Not on file   Years of education: Not on file   Highest education level: Not on file  Occupational History   Occupation: retired  Tobacco Use   Smoking status: Former    Packs/day: 0.50    Years: 39.00    Pack years: 19.50    Types: Cigarettes    Quit date: 08/28/1993    Years since quitting: 27.7   Smokeless tobacco: Never  Vaping Use   Vaping Use: Never used  Substance and Sexual Activity   Alcohol use: No    Comment: quit 95   Drug use: No   Sexual activity: Not on file  Other Topics Concern   Not on file  Social History Narrative   Lives with husband and son in [place].  She is  currently retired.   Former smoker quit in 1995.   Social Determinants of Health   Financial Resource Strain: Not on file  Food Insecurity: Not on file  Transportation Needs: Not on file  Physical Activity: Not on file  Stress: Not on file  Social Connections: Not on file  Intimate Partner Violence: Not on file   Family History  Adopted: Yes  Problem Relation Age of Onset   Other Mother    Hypertension Mother    Colon cancer Neg Hx    Esophageal cancer Neg Hx    Rectal cancer Neg Hx    Stomach cancer Neg Hx    Current Outpatient Medications  Medication Sig Dispense Refill   acetaminophen (TYLENOL) 650 MG CR tablet Take 1,300 mg by mouth 2 (two) times daily.     albuterol (VENTOLIN HFA) 108 (90 Base) MCG/ACT inhaler Inhale 2 puffs  into the lungs every 6 (six) hours as needed for wheezing or shortness of breath.     apixaban (ELIQUIS) 5 MG TABS tablet Take 2.5 mg by mouth 2 (two) times daily.     Coenzyme Q10 (COQ-10) 100 MG CAPS Take 100 mg by mouth in the morning and at bedtime.      colchicine 0.6 MG tablet Take 0.6 mg by mouth daily as needed (Gout).      Dulaglutide (TRULICITY [place]) Inject 1 Dose into the skin once a week.     empagliflozin (JARDIANCE) 10 MG TABS tablet Take 1 tablet (10 mg total) by mouth daily before breakfast. 90 tablet 2   ezetimibe (ZETIA) 10 MG tablet Take 10 mg by mouth daily.     ferrous sulfate 325 (65 FE) MG tablet Take 325 mg by mouth every other day.     isosorbide-hydrALAZINE (BIDIL) 20-37.5 MG tablet Take 0.5 tablets by mouth in the morning, at noon, and at bedtime.     levothyroxine (SYNTHROID) 112 MCG tablet Take 1 tablet (112 mcg total) by mouth daily before breakfast. 30 tablet 12   metoprolol succinate (TOPROL-XL) 100 MG 24 hr tablet Take 1 tablet (100 mg total) by mouth every morning AND 0.5 tablets (50 mg total) every evening. Take with or immediately following a meal.. 135 tablet 3   Multiple Vitamin (MULTIVITAMIN WITH MINERALS) TABS tablet Take 1 tablet by mouth daily.     Multiple Vitamins-Minerals (PRESERVISION AREDS 2) CAPS Take 1 capsule by mouth 2 (two) times daily.     potassium chloride SA (KLOR-CON) 20 MEQ tablet Take 1 tablet by mouth twice daily 60 tablet 11   rosuvastatin (CRESTOR) 5 MG tablet Take 5 mg by mouth every other day.      umeclidinium-vilanterol (ANORO ELLIPTA) 62.5-25 MCG/INH AEPB Inhale 1 puff into the lungs daily.     torsemide (DEMADEX) 100 MG tablet Take 1 tablet (100 mg total) by mouth 2 (two) times daily. 180 tablet 3   No current facility-administered medications for this encounter.   BP 110/70   Pulse 81   Wt 102.3 kg (225 lb 9.6 oz)   SpO2 95%   BMI 41.26 kg/m  Exam:   General: NAD, obese.  Neck: JVP 8-9 cm, no thyromegaly or thyroid nodule.   Lungs: Clear to auscultation bilaterally with normal respiratory effort. CV: Nondisplaced PMI.  Heart regular S1/S2, no S3/S4, no murmur.  Trace ankle edema.  No carotid bruit.  Normal pedal pulses.  Abdomen: Soft, nontender, no hepatosplenomegaly, no distention.  Skin: Intact without lesions or rashes.  Neurologic: Alert and oriented  x 3.  Psych: Normal affect. Extremities: No clubbing or cyanosis.  HEENT: Normal.   Assessment/Plan: 1. Chronic systolic CHF: Ischemic cardiomyopathy.  St Jude CRT-D device, now s/p AV nodal ablation.  Echo in 3/21 showed EF 20% with diffuse hypokinesis, mildly dilated RV with mildly decreased systolic function, PASP 49 mmHg, dilated IVC.  NYHA class III symptoms, better since AVN ablation.  Mild volume overload on exam.  Meds limited by CKD stage IV.  - Increase torsemide to 100 mg bid.  BMET today and in 10 days.  - Continue empagliflozin 10 mg daily.   - She was unable to tolerate Bidil 1 tab tid, but she does fine with Bidil 1/2 tab tid so will continue.  - Continue Toprol XL 100 qam/50 qpm.   - Off spironolactone with side effects.  - Repeat echo at followup in 3 months.  2. CAD: S/p CABG in 2015. No chest pain.  - No ASA given apixaban use.  - Continue Crestor 5 daily + Zetia, good lipids in 3/22.  3. Atrial fibrillation: Chronic now, looks like it has gone on since 2018.  I was unable to cardiovert her on amiodarone so it was stopped.  I do not think that I will be able to get her out of atrial fibrillation. She is now s/p AVN ablation to allow more BiV pacing.  - Continue apixaban.  4. CKD: Stage 4.  BMET today.   Followup 3 months with echo     Signed, Loralie Champagne, MD  05/18/2021  [facility] [address] [facility] [place] [address] [phone] (office) [phone] (fax)

## 2021-05-18 NOTE — Patient Instructions (Signed)
Labs done today. We will contact you only if your labs are abnormal.  INCREASE Torsemide to 100mg  (1 tablet) by mouth 2 times daily. (A new prescription was sent into the pharmacy for you.)  No other medication changes were made. Please continue all current medications as prescribed.  Your physician recommends that you schedule a follow-up appointment in: 2 weeks for a lab only appointment and in 3 months with an echo prior to your exam.  If you have any questions or concerns before your next appointment please send [place] a message through [place] or call our office at [phone].    TO LEAVE A MESSAGE FOR THE NURSE SELECT OPTION 2, PLEASE LEAVE A MESSAGE INCLUDING: YOUR NAME DATE OF BIRTH CALL BACK NUMBER REASON FOR CALL**this is important as we prioritize the call backs  YOU WILL RECEIVE A CALL BACK THE SAME DAY AS LONG AS YOU CALL BEFORE 4:00 PM   Do the following things EVERYDAY: Weigh yourself in the morning before breakfast. Write it down and keep it in a log. Take your medicines as prescribed Eat low salt foods--Limit salt (sodium) to 2000 mg per day.  Stay as active as you can everyday Limit all fluids for the day to less than 2 liters   At the [facility], you and your health needs are our priority. As part of our continuing mission to provide you with exceptional heart care, we have created designated Provider Care Teams. These Care Teams include your primary Cardiologist (physician) and Advanced Practice Providers (APPs- Physician Assistants and Nurse Practitioners) who all work together to provide you with the care you need, when you need it.   You may see any of the following providers on your designated Care Team at your next follow up: Dr Glori Bickers Dr Haynes Kerns, NP Lyda Jester, [place] Audry Riles, PharmD   Please be sure to bring in all your medications bottles to every appointment.

## 2021-06-01 ENCOUNTER — Other Ambulatory Visit: Payer: Self-pay

## 2021-06-01 ENCOUNTER — Other Ambulatory Visit (HOSPITAL_COMMUNITY): Payer: Self-pay | Admitting: Cardiology

## 2021-06-01 ENCOUNTER — Ambulatory Visit (HOSPITAL_COMMUNITY)
Admission: RE | Admit: 2021-06-01 | Discharge: 2021-06-01 | Disposition: A | Discharge status: Home / Self Care | Payer: Medicare Other | Source: Ambulatory Visit | Attending: Cardiology | Admitting: Cardiology

## 2021-06-01 DIAGNOSIS — I5042 Chronic combined systolic (congestive) and diastolic (congestive) heart failure: Secondary | ICD-10-CM

## 2021-06-01 LAB — BASIC METABOLIC PANEL
Anion gap: 10 (ref 5–15)
BUN: 39 mg/dL — ABNORMAL HIGH (ref 8–23)
CO2: 29 mmol/L (ref 22–32)
Calcium: 9.7 mg/dL (ref 8.9–10.3)
Chloride: 96 mmol/L — ABNORMAL LOW (ref 98–111)
Creatinine, Ser: 2.41 mg/dL — ABNORMAL HIGH (ref 0.44–1.00)
GFR, Estimated: 20 mL/min — ABNORMAL LOW (ref >60)
Glucose, Bld: 206 mg/dL — ABNORMAL HIGH (ref 70–99)
Potassium: 4.8 mmol/L (ref 3.5–5.1)
Sodium: 135 mmol/L (ref 135–145)

## 2021-06-05 ENCOUNTER — Other Ambulatory Visit (HOSPITAL_COMMUNITY): Payer: Self-pay | Admitting: Cardiology

## 2021-06-08 ENCOUNTER — Ambulatory Visit (INDEPENDENT_AMBULATORY_CARE_PROVIDER_SITE_OTHER): Payer: Medicare Other

## 2021-06-08 DIAGNOSIS — I255 Ischemic cardiomyopathy: Secondary | ICD-10-CM

## 2021-06-09 LAB — CUP PACEART REMOTE DEVICE CHECK
Battery Remaining Longevity: 68 mo
Battery Remaining Percentage: 73 %
Battery Voltage: 2.98 V
Date Time Interrogation Session: 20221012020208
HighPow Impedance: 66 Ohm
Implantable Lead Implant Date: 20201130
Implantable Lead Implant Date: 20201130
Implantable Lead Location: 753858
Implantable Lead Location: 753860
Implantable Pulse Generator Implant Date: 20201130
Lead Channel Impedance Value: 490 Ohm
Lead Channel Impedance Value: 530 Ohm
Lead Channel Pacing Threshold Amplitude: 0.5 V
Lead Channel Pacing Threshold Amplitude: 0.75 V
Lead Channel Pacing Threshold Pulse Width: 0.4 ms
Lead Channel Pacing Threshold Pulse Width: 0.4 ms
Lead Channel Sensing Intrinsic Amplitude: 12 mV
Lead Channel Setting Pacing Amplitude: 1.75 V
Lead Channel Setting Pacing Amplitude: 2.5 V
Lead Channel Setting Pacing Pulse Width: 0.4 ms
Lead Channel Setting Pacing Pulse Width: 0.4 ms
Lead Channel Setting Sensing Sensitivity: 0.5 mV
Pulse Gen Serial Number: 810000193

## 2021-06-16 NOTE — Progress Notes (Signed)
Remote ICD transmission.   

## 2021-06-20 ENCOUNTER — Ambulatory Visit (INDEPENDENT_AMBULATORY_CARE_PROVIDER_SITE_OTHER): Payer: Medicare Other

## 2021-06-20 DIAGNOSIS — I5022 Chronic systolic (congestive) heart failure: Secondary | ICD-10-CM | POA: Diagnosis not present

## 2021-06-20 DIAGNOSIS — Z95810 Presence of automatic (implantable) cardiac defibrillator: Secondary | ICD-10-CM | POA: Diagnosis not present

## 2021-06-21 ENCOUNTER — Telehealth: Payer: Self-pay

## 2021-06-21 NOTE — Progress Notes (Signed)
EPIC Encounter for ICM Monitoring  Patient Name: Elaine [name] is a 80 y.o. female Date: 06/21/2021 Primary Care Physican: Ginger Organ., MD Primary Cardiologist: [facility] Electrophysiologist: Cyndi Bender Pacing: 96% 05/17/2021 Weight: 217.8 lbs                                                            Attempted call to patient and unable to reach.  Left detailed message per DPR regarding transmission. Transmission reviewed.    CorVue thoracic impedance suggesting possible fluid accumulation starting 06/13/2021 (7 days).     Prescribed:   Torsemide 20 mg take 5 tablets (100 mg total) by mouth twice a day.  Potassium 20 mEq 1 tablet by mouth daily   Labs: 06/01/2021 Creatinine 2.41, BUN 39, Potassium 4.8, Sodium 135, GFR 20 05/18/2021 Creatinine 2.23, BUN 38, Potassium 5.0, Sodium 136, GFR 22 02/15/2021 Creatinine 2.06, BUN 33, Potassium 4.9, Sodium 136, GFR 24 12/17/2020 Creatinine 2.35, BUN 48, Potassium 3.8, Sodium 136, GFR 20 11/23/2020 Creatinine 2.59, BUN 44, Potassium 4.1, Sodium 136, GFR 18 A complete set of results can be found in Results Review.   Recommendations:  Left voice mail with ICM number and encouraged to call if experiencing any fluid symptoms.   Follow-up plan: ICM clinic phone appointment on 06/27/2021 (manual) to recheck fluid levels.   91 day device clinic remote transmission 09/07/2021.     EP/Cardiology Office Visits:  08/24/2021 with Dr Aundra Dubin.   06/23/2021 with Dr Curt Bears.   Copy of ICM check sent to Dr. Curt Bears.   Will send to Dr Aundra Dubin for review if patient is reached.  3 month ICM trend: 06/20/2021.    1 Year ICM trend:       [name], RN 06/21/2021 9:35 AM

## 2021-06-21 NOTE — Telephone Encounter (Signed)
Remote ICM transmission received.  Attempted call to patient regarding ICM remote transmission and left detailed message per DPR.  Advised to return call for any fluid symptoms or questions. Next ICM remote transmission scheduled 06/27/2021.    

## 2021-06-23 ENCOUNTER — Encounter: Payer: Medicare Other | Admitting: Cardiology

## 2021-06-27 ENCOUNTER — Ambulatory Visit (INDEPENDENT_AMBULATORY_CARE_PROVIDER_SITE_OTHER): Payer: Medicare Other

## 2021-06-27 DIAGNOSIS — Z95810 Presence of automatic (implantable) cardiac defibrillator: Secondary | ICD-10-CM

## 2021-06-27 DIAGNOSIS — I5022 Chronic systolic (congestive) heart failure: Secondary | ICD-10-CM

## 2021-06-27 NOTE — Progress Notes (Signed)
EPIC Encounter for ICM Monitoring  Patient Name: Elaine [name] is a 80 y.o. female Date: 06/27/2021 Primary Care Physican: Ginger Organ., MD Primary Cardiologist: [facility] Electrophysiologist: Cyndi Bender Pacing: 96% 05/17/2021 Weight: 217.8 lbs                                                            Transmission reviewed.    CorVue thoracic impedance suggesting fluid levels returned to normal.     Prescribed:   Torsemide 20 mg take 5 tablets (100 mg total) by mouth twice a day.  Potassium 20 mEq 1 tablet by mouth daily   Labs: 06/01/2021 Creatinine 2.41, BUN 39, Potassium 4.8, Sodium 135, GFR 20 05/18/2021 Creatinine 2.23, BUN 38, Potassium 5.0, Sodium 136, GFR 22 02/15/2021 Creatinine 2.06, BUN 33, Potassium 4.9, Sodium 136, GFR 24 12/17/2020 Creatinine 2.35, BUN 48, Potassium 3.8, Sodium 136, GFR 20 11/23/2020 Creatinine 2.59, BUN 44, Potassium 4.1, Sodium 136, GFR 18 A complete set of results can be found in Results Review.   Recommendations:  None   Follow-up plan: ICM clinic phone appointment on 08/01/2021.   91 day device clinic remote transmission 09/07/2021.     EP/Cardiology Office Visits:  08/24/2021 with Dr Aundra Dubin.   07/07/2021 with Dr Curt Bears.   Copy of ICM check sent to Dr. Curt Bears.   3 month ICM trend: 06/26/2021.    [name], RN 06/27/2021 3:34 PM

## 2021-07-07 ENCOUNTER — Encounter: Payer: Self-pay | Admitting: Cardiology

## 2021-07-07 ENCOUNTER — Other Ambulatory Visit: Payer: Self-pay

## 2021-07-07 ENCOUNTER — Ambulatory Visit (INDEPENDENT_AMBULATORY_CARE_PROVIDER_SITE_OTHER): Payer: Medicare Other | Admitting: Cardiology

## 2021-07-07 VITALS — BP 122/80 | HR 80 | Resp 18 | Ht 62.0 in | Wt 227.0 lb

## 2021-07-07 DIAGNOSIS — I5022 Chronic systolic (congestive) heart failure: Secondary | ICD-10-CM | POA: Diagnosis not present

## 2021-07-07 NOTE — Patient Instructions (Signed)
Medication Instructions:  Your physician has recommended you make the following change in your medication:  STOP Jardiance  *If you need a refill on your cardiac medications before your next appointment, please call your pharmacy*   Lab Work: None ordered   Testing/Procedures: None ordered   Follow-Up: At [facility], you and your health needs are our priority.  As part of our continuing mission to provide you with exceptional heart care, we have created designated Provider Care Teams.  These Care Teams include your primary Cardiologist (physician) and Advanced Practice Providers (APPs -  Physician Assistants and Nurse Practitioners) who all work together to provide you with the care you need, when you need it.  Remote monitoring is used to monitor your Pacemaker or ICD from home. This monitoring reduces the number of office visits required to check your device to one time per year. It allows [place] to keep an eye on the functioning of your device to ensure it is working properly. You are scheduled for a device check from home on 08/01/2021. You may send your transmission at any time that day. If you have a wireless device, the transmission will be sent automatically. After your physician reviews your transmission, you will receive a postcard with your next transmission date.  Your next appointment:   1 year(s)  The format for your next appointment:   In Person  Provider:   Allegra Lai, MD   Thank you for choosing [facility]!!   Trinidad Curet, RN [phone]

## 2021-07-07 NOTE — Progress Notes (Signed)
Electrophysiology Office Note   Date:  07/07/2021   ID:  Elaine [name], DOB [birth year], MRN [id number]  PCP:  Elaine [name]., MD  Cardiologist:  Elaine [name] Primary Electrophysiologist:  Elaine [name] Elaine Leeds, MD    Chief Complaint: CHF   History of Present Illness: Elaine [name] is a 80 y.o. female who is being seen today for the evaluation of CHF at the request of Elaine [name]., MD. Presenting today for electrophysiology evaluation.  She has a history significant for chronic systolic heart failure due to ischemic cardiomyopathy, coronary artery disease status post CABG with left atrial appendage clipping, permanent atrial fibrillation, hypertension, hyperlipidemia, type 2 diabetes, hypothyroidism, CKD stage III.  She is hospitalized 06/12/2019 decompensated heart failure.  She was found to have an ejection fraction of less than 20%.  She is status post [place] CRT-D implanted 07/28/2019.  She was having decreased right ventricular pacing and is now status post AV node ablation 05/19/2028.  Today, denies symptoms of palpitations, chest pain, shortness of breath, orthopnea, PND, lower extremity edema, claudication, dizziness, presyncope, syncope, bleeding, or neurologic sequela. The patient is tolerating medications without difficulties.  Since being seen she has done well.  She recently started [place].  Unfortunately, she has noted more frequent urinary tract and yeast infections since starting the medications.  Aside from that, she has no complaints and has been doing well.  Past Medical History:  Diagnosis Date   Acute myocardial infarction of anterior wall ([place]) 10/20/2013   Afib, LBBB   Acute pulmonary edema with congestive heart failure ([place]) 10/20/2013   Resolved   Arthritis    Atrial fibrillation ([place]) 10/20/2013   On Warfarin   CAD, multiple vessel 10/20/2013   LM, LAD & RCA --> CABG; MYOVIEW 12/'15: Intermediate Risk would large anterior, anteroapical segment the apex  possible infarct and peri-infarct ischemia   CHF (congestive heart failure) (HCC)    EF 40-45%.   Chronic kidney disease    COPD (chronic obstructive pulmonary disease) (HCC)    Diabetic neuropathy (HCC)    Heart murmur    Likely related to aortic sclerosis   Hyperlipidemia    Hypertension    Hypothyroidism    Ischemic cardiomyopathy - Notable Improvement in EF post CABG 10/20/2013   Echo 2/23: EF 20-25%; mild LVH. anteroseptal Akinesis; mid-apical anterior, inferior & inferoseptal + apical-lateral akinesis; G 1 DDysfxn.; Mild-Mod LA dil; mild MR; Mod PHTN;; F/u Echo 12/2013: EF 40-45%, septal & apical HK, mild LVH; Mod LA dilation   Left bundle branch block (LBBB) on electrocardiogram    Mild mitral regurgitation by prior echocardiogram    Mild-Mod MR on Echo   Morbid obesity ([place])    OSTEOARTHRITIS 08/06/2006   S/P CABG x 3 10/23/2013   LIMA to LAD, SVG to OM2, SVG to RPLB, EVH via right thigh   Type II diabetes mellitus with complication ([place]) 86/57/8469   off rx since heart attack 12/15-dr told could stay off if keep cbg under 150   UTI (lower urinary tract infection) 09/29/14   ongoing now. started rx 09/28/14   Past Surgical History:  Procedure Laterality Date   ABDOMINAL HYSTERECTOMY     AV NODE ABLATION N/A 05/19/2020   Procedure: AV NODE ABLATION;  Surgeon: Constance Haw, MD;  Location: [place] [facility];  Service: Cardiovascular;  Laterality: N/A;   BIV ICD INSERTION CRT-D N/A 07/28/2019   Procedure: BIV ICD INSERTION CRT-D;  Surgeon: Constance Haw, MD;  Location: [place] [facility];  Service: Cardiovascular;  Laterality: N/A;   BREAST BIOPSY Right 08/04/2016    fibroadenoma with calcifications    BREAST BIOPSY Right 07/16/2014   fibroadenoma with calcifications and atypical   BREAST EXCISIONAL BIOPSY Right 10/06/2014   atypical lobular hyperplasia    BREAST LUMPECTOMY W/ NEEDLE LOCALIZATION Right 10/06/2014   Dr Georgette Dover   BREAST LUMPECTOMY WITH NEEDLE  LOCALIZATION Right 10/06/2014   Procedure: RIGHT BREAST LUMPECTOMY WITH NEEDLE LOCALIZATION;  Surgeon: Donnie Mesa, MD;  Location: [facility];  Service: General;  Laterality: Right;   CARDIOVERSION N/A 10/22/2019   Procedure: CARDIOVERSION;  Surgeon: Larey Dresser, MD;  Location: [facility];  Service: Cardiovascular;  Laterality: N/A;   CLIPPING OF ATRIAL APPENDAGE N/A 10/23/2013   Procedure: CLIPPING OF ATRIAL APPENDAGE;  Surgeon: Rexene Alberts, MD;  Location: [facility];  Service: Open Heart Surgery;  Laterality: N/A;   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   CORONARY ARTERY BYPASS GRAFT N/A 10/23/2013   Procedure: CORONARY ARTERY BYPASS GRAFTING (CABG);  Surgeon: Rexene Alberts, MD;  Location: [facility];  Service: Open Heart Surgery: LIMA-LAD, SVG-OM2, SVG-RPL   INTRAOPERATIVE TRANSESOPHAGEAL ECHOCARDIOGRAM N/A 10/23/2013   Procedure: INTRAOPERATIVE TRANSESOPHAGEAL ECHOCARDIOGRAM;  Surgeon: Rexene Alberts, MD;  Location: [facility];  Service: Open Heart Surgery;  Laterality: N/A;   LEFT HEART CATHETERIZATION WITH CORONARY ANGIOGRAM N/A 10/20/2013   Procedure: LEFT HEART CATHETERIZATION WITH CORONARY ANGIOGRAM;  Surgeon: Leonie Man, MD;  Location: [facility] CATH LAB;  Service: Cardiovascular: Distal LM ~70%, LAD - ostial 70%, prox 80, 95 & 95%; RCA prox 70%, mid 80%; minimal Cx disease   LEFT HEART CATHETERIZATION WITH CORONARY/GRAFT ANGIOGRAM N/A 09/08/2014   Procedure: LEFT HEART CATHETERIZATION WITH Beatrix Fetters;  Surgeon: Leonie Man, MD;  Location: [facility] CATH LAB: Indication: Abnormal Myoview. Occluded LAD after 70 and 80% left main. Diffuse RCA disease. Widely patent grafts to Moderately diseased artery vessels. Mild-moderate LV dysfunction and chronic A. fib.   NM MYOVIEW LTD  08/14/2014   [place] scan: INTERMEDIATE RISK - large, severe intensity perfusion defect in anterior wall, apex and inferior apex that is partly reversible. This suggests either scar or possible hibernating myocardium.  Nondeviated.-> Likely scar. No change in coronary anatomy with patent grafts.   TOTAL HIP ARTHROPLASTY  2010, 2011   right 2010, left 2011   TRANSTHORACIC ECHOCARDIOGRAM  12/2013   Mildly dilated left ventricle with mild to moderately reduced function. EF 40-45% (notable improvement from February 2015). Septal and apical hypokinesis. Mild LA dilation.   TRANSTHORACIC ECHOCARDIOGRAM  11/11/2019   EF 20% with severely dysfunction. Global HK. Unable to determine diastolic pressures. Mild RV function-with mildly elevated PAP-RAP estimated 15 mmHg.. Moderate LA dilation, mild RA dilation.     Current Outpatient Medications  Medication Sig Dispense Refill   acetaminophen (TYLENOL) 650 MG CR tablet Take 1,300 mg by mouth 2 (two) times daily.     albuterol (VENTOLIN HFA) 108 (90 Base) MCG/ACT inhaler Inhale 2 puffs into the lungs every 6 (six) hours as needed for wheezing or shortness of breath.     apixaban (ELIQUIS) 5 MG TABS tablet Take 2.5 mg by mouth 2 (two) times daily.     BIDIL 20-37.5 MG tablet TAKE 1 TABLET BY MOUTH THREE TIMES DAILY **NOTE  DOSE  CHANGE** (Patient taking differently: 0.5 tablets.) 90 tablet 0   Coenzyme Q10 (COQ-10) 100 MG CAPS Take 100 mg by mouth in the morning and at bedtime.  colchicine 0.6 MG tablet Take 0.6 mg by mouth daily as needed (Gout).      Dulaglutide (TRULICITY [place]) Inject 1 Dose into the skin once a week.     ezetimibe (ZETIA) 10 MG tablet Take 10 mg by mouth daily.     ferrous sulfate 325 (65 FE) MG tablet Take 325 mg by mouth every other day.     JARDIANCE 10 MG TABS tablet TAKE 1 TABLET BY MOUTH ONCE DAILY BEFORE BREAKFAST 90 tablet 3   levothyroxine (SYNTHROID) 112 MCG tablet Take 1 tablet (112 mcg total) by mouth daily before breakfast. 30 tablet 12   metoprolol succinate (TOPROL-XL) 100 MG 24 hr tablet Take 1 tablet (100 mg total) by mouth every morning AND 0.5 tablets (50 mg total) every evening. Take with or immediately following a meal.. 135  tablet 3   Multiple Vitamin (MULTIVITAMIN WITH MINERALS) TABS tablet Take 1 tablet by mouth daily.     Multiple Vitamins-Minerals (PRESERVISION AREDS 2) CAPS Take 1 capsule by mouth 2 (two) times daily.     potassium chloride SA (KLOR-CON) 20 MEQ tablet Take 1 tablet by mouth twice daily 60 tablet 11   rosuvastatin (CRESTOR) 5 MG tablet Take 5 mg by mouth every other day.      torsemide (DEMADEX) 100 MG tablet Take 1 tablet (100 mg total) by mouth 2 (two) times daily. 180 tablet 3   umeclidinium-vilanterol (ANORO ELLIPTA) 62.5-25 MCG/INH AEPB Inhale 1 puff into the lungs daily.     No current facility-administered medications for this visit.    Allergies:   Crestor [rosuvastatin calcium], Allopurinol, and Tape   Social History:  The patient  reports that she quit smoking about 27 years ago. Her smoking use included cigarettes. She has a 19.50 pack-year smoking history. She has never used smokeless tobacco. She reports that she does not drink alcohol and does not use drugs.   Family History:  The patient's family history includes Hypertension in her mother; Other in her mother. She was adopted.   ROS:  Please see the history of present illness.   Otherwise, review of systems is positive for none.   All other systems are reviewed and negative.   PHYSICAL EXAM: VS:  BP 122/80   Pulse 80   Resp 18   Ht 5\' 2"  (1.575 m)   Wt 227 lb (103 kg)   SpO2 90%   BMI 41.52 kg/m  , BMI Body mass index is 41.52 kg/m. GEN: Well nourished, well developed, in no acute distress  HEENT: normal  Neck: no JVD, carotid bruits, or masses Cardiac: RRR; no murmurs, rubs, or gallops,no edema  Respiratory:  clear to auscultation bilaterally, normal work of breathing GI: soft, nontender, nondistended, + BS MS: no deformity or atrophy  Skin: warm and dry, device site well healed Neuro:  Strength and sensation are intact Psych: euthymic mood, full affect  EKG:  EKG is ordered today. Personal review of the ekg  ordered shows AF, V paced  Personal review of the device interrogation today. Results in [place]: 11/23/2020: Hemoglobin 13.3; Platelets 207 05/18/2021: B Natriuretic Peptide 277.1 06/01/2021: BUN 39; Creatinine, Ser 2.41; Potassium 4.8; Sodium 135    Lipid Panel     Component Value Date/Time   CHOL 154 11/23/2020 1128   TRIG 66 11/23/2020 1128   HDL 64 11/23/2020 1128   CHOLHDL 2.4 11/23/2020 1128   VLDL 13 11/23/2020 1128   LDLCALC 77 11/23/2020 1128  Wt Readings from Last 3 Encounters:  07/07/21 227 lb (103 kg)  05/18/21 225 lb 9.6 oz (102.3 kg)  03/02/21 225 lb (102.1 kg)      Other studies Reviewed: Additional studies/ records that were reviewed today include: TTE 06/13/19  Review of the above records today demonstrates:   1. Left ventricular ejection fraction, by visual estimation, is <20%. The left ventricle has severely decreased function. There is mildly increased left ventricular hypertrophy.  2. Left ventricular diastolic function could not be evaluated pattern of LV diastolic filling.  3. Global right ventricle has normal systolic function.The right ventricular size is normal. No increase in right ventricular wall thickness.  4. Left atrial size was normal.  5. Right atrial size was normal.  6. Moderate calcification of the mitral valve leaflet(s).  7. Moderate thickening of the mitral valve leaflet(s).  8. The mitral valve is abnormal. Trace mitral valve regurgitation.  9. The tricuspid valve is normal in structure. Tricuspid valve regurgitation is mild. 10. The aortic valve is tricuspid Aortic valve regurgitation is trivial by color flow Doppler. Mild to moderate aortic valve sclerosis/calcification without any evidence of aortic stenosis. 11. The pulmonic valve was normal in structure. Pulmonic valve regurgitation is trivial by color flow Doppler. 12. Moderately elevated pulmonary artery systolic pressure. 13. The inferior vena cava is normal  in size with greater than 50% respiratory variability, suggesting right atrial pressure of 3 mmHg. 14. The atrial septum is grossly normal.   ASSESSMENT AND PLAN:  1.  Chronic systolic heart failure due to ischemic cardiomyopathy: Currently on Lasix, Toprol-XL.  Status post [place] CRT-D implanted 07/28/2019.  Is now status post AV node ablation to increase biventricular pacing.  Device functioning appropriately.  We Camren Henthorn decrease her base pacing rate to 70 bpm and turn on rate response today.  She is on Jardiance but has unfortunately had more frequent urinary and yeast infections.  I Annaly Skop stop her Jardiance today.  We Lorry Anastasi alert her primary cardiologist.  2.  Permanent atrial fibrillation: Currently on Eliquis with a CHA2DS2-VASc of 6.  Status post AV node ablation 02/17/2020.  3.  Coronary artery disease status post CABG: No current chest pain.  Continue with current management.  Current medicines are reviewed at length with the patient today.   The patient does not have concerns regarding her medicines.  The following changes were made today: Stop Jardiance  Labs/ tests ordered today include:  Orders Placed This Encounter  Procedures   EKG 12-Lead      Disposition:   FU with Illana Nolting 12 months  Signed, Joely Losier Elaine Leeds, MD  07/07/2021 10:25 AM     [address] [place] [address] [phone] (office) [phone] (fax)

## 2021-07-28 ENCOUNTER — Telehealth (HOSPITAL_COMMUNITY): Payer: Self-pay | Admitting: *Deleted

## 2021-07-28 NOTE — Telephone Encounter (Signed)
Take metolazone 2.5 with torsemide tomorrow morning and again on Friday morning.  Followup in APP clinic next week with BMET.

## 2021-07-28 NOTE — Telephone Encounter (Signed)
Pt left vm stating her weight is up 10lbs in 10days. Pt said she takes torsemide 100mg  bid and asked if she needs to increase her medications.  Pt denies shortness of breath but said she notices swelling in her feet, ankles, and legs.   Routed to [place] for advice

## 2021-07-29 MED ORDER — METOLAZONE 2.5 MG PO TABS: ORAL_TABLET | ORAL | 0 refills | Status: DC

## 2021-07-29 NOTE — Telephone Encounter (Signed)
Pt aware and agreeable with plan. Pt will call back to schedule appt.

## 2021-08-01 ENCOUNTER — Ambulatory Visit (INDEPENDENT_AMBULATORY_CARE_PROVIDER_SITE_OTHER): Payer: Medicare Other

## 2021-08-01 DIAGNOSIS — I5022 Chronic systolic (congestive) heart failure: Secondary | ICD-10-CM | POA: Diagnosis not present

## 2021-08-01 DIAGNOSIS — Z95810 Presence of automatic (implantable) cardiac defibrillator: Secondary | ICD-10-CM | POA: Diagnosis not present

## 2021-08-03 NOTE — Progress Notes (Signed)
EPIC Encounter for ICM Monitoring  Patient Name: Elaine [name] is a 80 y.o. female Date: 08/03/2021 Primary Care Physican: Ginger Organ., MD Primary Cardiologist: [place] Electrophysiologist: Cyndi Bender Pacing: >99% 08/03/2021 Weight: 220.7 lbs                                                            Spoke with patient and heart failure questions reviewed.  Pt reports after taking Metolazone as directed by HF clinic she lost total of 11 lbs of the 10 lbs gained.   CorVue thoracic impedance normal fluid levels after taking Metolazone on 12/1.  Impedance suggesting possible fluid accumulation from 10/31 - 12/1.   Prescribed:   Torsemide 20 mg take 5 tablets (100 mg total) by mouth twice a day.  Potassium 20 mEq 1 tablet by mouth daily Metolazone 2.5 mg Take only as directed by CHF clinic.   Labs: 06/01/2021 Creatinine 2.41, BUN 39, Potassium 4.8, Sodium 135, GFR 20 05/18/2021 Creatinine 2.23, BUN 38, Potassium 5.0, Sodium 136, GFR 22 02/15/2021 Creatinine 2.06, BUN 33, Potassium 4.9, Sodium 136, GFR 24 12/17/2020 Creatinine 2.35, BUN 48, Potassium 3.8, Sodium 136, GFR 20 11/23/2020 Creatinine 2.59, BUN 44, Potassium 4.1, Sodium 136, GFR 18 A complete set of results can be found in Results Review.   Recommendations:  Recommendation to limit salt intake to 2000 mg daily and fluid intake to 64 oz daily.  Encouraged to call if experiencing any fluid symptoms.    Follow-up plan: ICM clinic phone appointment on 09/05/2021.   91 day device clinic remote transmission 09/07/2021.     EP/Cardiology Office Visits:  08/24/2021 with Dr Aundra Dubin.      Copy of ICM check sent to Dr. Curt Bears.   3 month ICM trend: 08/01/2021.    12-14 Month ICM trend:       [name], RN 08/03/2021 4:14 PM

## 2021-08-12 ENCOUNTER — Other Ambulatory Visit (HOSPITAL_COMMUNITY): Payer: Self-pay | Admitting: Cardiology

## 2021-08-24 ENCOUNTER — Other Ambulatory Visit: Payer: Self-pay

## 2021-08-24 ENCOUNTER — Ambulatory Visit (HOSPITAL_COMMUNITY)
Admission: RE | Admit: 2021-08-24 | Discharge: 2021-08-24 | Disposition: A | Discharge status: Home / Self Care | Payer: Medicare Other | Source: Ambulatory Visit | Attending: Cardiology | Admitting: Cardiology

## 2021-08-24 ENCOUNTER — Ambulatory Visit (HOSPITAL_BASED_OUTPATIENT_CLINIC_OR_DEPARTMENT_OTHER)
Admission: RE | Admit: 2021-08-24 | Discharge: 2021-08-24 | Disposition: A | Discharge status: Home / Self Care | Payer: Medicare Other | Source: Ambulatory Visit | Attending: Cardiology | Admitting: Cardiology

## 2021-08-24 ENCOUNTER — Encounter (HOSPITAL_COMMUNITY): Payer: Self-pay | Admitting: Cardiology

## 2021-08-24 VITALS — BP 121/70 | HR 73 | Wt 221.0 lb

## 2021-08-24 DIAGNOSIS — Z87440 Personal history of urinary (tract) infections: Secondary | ICD-10-CM | POA: Diagnosis not present

## 2021-08-24 DIAGNOSIS — I2510 Atherosclerotic heart disease of native coronary artery without angina pectoris: Secondary | ICD-10-CM | POA: Diagnosis not present

## 2021-08-24 DIAGNOSIS — Z95810 Presence of automatic (implantable) cardiac defibrillator: Secondary | ICD-10-CM | POA: Insufficient documentation

## 2021-08-24 DIAGNOSIS — N184 Chronic kidney disease, stage 4 (severe): Secondary | ICD-10-CM | POA: Insufficient documentation

## 2021-08-24 DIAGNOSIS — M79606 Pain in leg, unspecified: Secondary | ICD-10-CM | POA: Insufficient documentation

## 2021-08-24 DIAGNOSIS — E785 Hyperlipidemia, unspecified: Secondary | ICD-10-CM

## 2021-08-24 DIAGNOSIS — I5022 Chronic systolic (congestive) heart failure: Secondary | ICD-10-CM

## 2021-08-24 DIAGNOSIS — E1122 Type 2 diabetes mellitus with diabetic chronic kidney disease: Secondary | ICD-10-CM | POA: Diagnosis not present

## 2021-08-24 DIAGNOSIS — I255 Ischemic cardiomyopathy: Secondary | ICD-10-CM | POA: Insufficient documentation

## 2021-08-24 DIAGNOSIS — E1140 Type 2 diabetes mellitus with diabetic neuropathy, unspecified: Secondary | ICD-10-CM | POA: Diagnosis not present

## 2021-08-24 DIAGNOSIS — Z951 Presence of aortocoronary bypass graft: Secondary | ICD-10-CM | POA: Insufficient documentation

## 2021-08-24 DIAGNOSIS — I482 Chronic atrial fibrillation, unspecified: Secondary | ICD-10-CM | POA: Diagnosis not present

## 2021-08-24 DIAGNOSIS — I130 Hypertensive heart and chronic kidney disease with heart failure and stage 1 through stage 4 chronic kidney disease, or unspecified chronic kidney disease: Secondary | ICD-10-CM | POA: Diagnosis not present

## 2021-08-24 DIAGNOSIS — I5042 Chronic combined systolic (congestive) and diastolic (congestive) heart failure: Secondary | ICD-10-CM | POA: Diagnosis not present

## 2021-08-24 DIAGNOSIS — Z7901 Long term (current) use of anticoagulants: Secondary | ICD-10-CM | POA: Insufficient documentation

## 2021-08-24 DIAGNOSIS — M25579 Pain in unspecified ankle and joints of unspecified foot: Secondary | ICD-10-CM | POA: Diagnosis not present

## 2021-08-24 DIAGNOSIS — M19079 Primary osteoarthritis, unspecified ankle and foot: Secondary | ICD-10-CM | POA: Insufficient documentation

## 2021-08-24 DIAGNOSIS — J449 Chronic obstructive pulmonary disease, unspecified: Secondary | ICD-10-CM | POA: Diagnosis not present

## 2021-08-24 DIAGNOSIS — Z79899 Other long term (current) drug therapy: Secondary | ICD-10-CM | POA: Diagnosis not present

## 2021-08-24 LAB — CBC
HCT: 39.7 % (ref 36.0–46.0)
Hemoglobin: 13.5 g/dL (ref 12.0–15.0)
MCH: 29.5 pg (ref 26.0–34.0)
MCHC: 34 g/dL (ref 30.0–36.0)
MCV: 86.7 fL (ref 80.0–100.0)
Platelets: 226 10*3/uL (ref 150–400)
RBC: 4.58 MIL/uL (ref 3.87–5.11)
RDW: 14.8 % (ref 11.5–15.5)
WBC: 7.2 10*3/uL (ref 4.0–10.5)
nRBC: 0 % (ref 0.0–0.2)

## 2021-08-24 LAB — ECHOCARDIOGRAM COMPLETE
Area-P 1/2: 4.39 cm2
Calc EF: 30.6 %
MV M vel: 4.73 m/s
MV Peak grad: 89.5 mmHg
Radius: 0.3 cm
S' Lateral: 4 cm
Single Plane A2C EF: 26.9 %
Single Plane A4C EF: 33.6 %

## 2021-08-24 LAB — BASIC METABOLIC PANEL
Anion gap: 12 (ref 5–15)
BUN: 80 mg/dL — ABNORMAL HIGH (ref 8–23)
CO2: 30 mmol/L (ref 22–32)
Calcium: 9.8 mg/dL (ref 8.9–10.3)
Chloride: 90 mmol/L — ABNORMAL LOW (ref 98–111)
Creatinine, Ser: 2.62 mg/dL — ABNORMAL HIGH (ref 0.44–1.00)
GFR, Estimated: 18 mL/min — ABNORMAL LOW (ref >60)
Glucose, Bld: 116 mg/dL — ABNORMAL HIGH (ref 70–99)
Potassium: 3.8 mmol/L (ref 3.5–5.1)
Sodium: 132 mmol/L — ABNORMAL LOW (ref 135–145)

## 2021-08-24 LAB — LIPID PANEL
Cholesterol: 136 mg/dL (ref 0–200)
HDL: 63 mg/dL (ref >40)
LDL Cholesterol: 62 mg/dL (ref 0–99)
Total CHOL/HDL Ratio: 2.2 RATIO
Triglycerides: 54 mg/dL (ref <150)
VLDL: 11 mg/dL (ref 0–40)

## 2021-08-24 NOTE — Patient Instructions (Signed)
Medication Changes:  None, PLEASE CONTINUE YOUR CURRENT MEDICATIONS  Lab Work:  Labs done today, your results will be available in MyChart, we will contact you for abnormal readings.  Testing/Procedures:  None  Referrals:  None  Special Instructions // Education:  Do the following things EVERYDAY: Weigh yourself in the morning before breakfast. Write it down and keep it in a log. Take your medicines as prescribed Eat low salt foods--Limit salt (sodium) to 2000 mg per day.  Stay as active as you can everyday Limit all fluids for the day to less than 2 liters  Follow-Up in: 2 months  At the [facility], you and your health needs are our priority. We have a designated team specialized in the treatment of Heart Failure. This Care Team includes your primary Heart Failure Specialized Cardiologist (physician), Advanced Practice Providers (APPs- Physician Assistants and Nurse Practitioners), and Pharmacist who all work together to provide you with the care you need, when you need it.   You may see any of the following providers on your designated Care Team at your next follow up:  Dr Glori Bickers Dr Haynes Kerns, NP Lyda Jester, [place] [facility] [place], [place] Audry Riles, PharmD   Please be sure to bring in all your medications bottles to every appointment.   Need to Contact us:  If you have any questions or concerns before your next appointment please send [place] a message through [place] or call our office at [phone].    TO LEAVE A MESSAGE FOR THE NURSE SELECT OPTION 2, PLEASE LEAVE A MESSAGE INCLUDING: YOUR NAME DATE OF BIRTH CALL BACK NUMBER REASON FOR CALL**this is important as we prioritize the call backs  YOU WILL RECEIVE A CALL BACK THE SAME DAY AS LONG AS YOU CALL BEFORE 4:00 PM

## 2021-08-24 NOTE — Progress Notes (Signed)
°  Echocardiogram 2D Echocardiogram has been performed.  Elaine [name] 08/24/2021, 10:02 AM

## 2021-08-24 NOTE — Progress Notes (Signed)
ID:  Elaine [name], DOB 05/27/1941, MRN [id number]  Provider location: [facility] Type of Visit: Established patient  PCP:  Marton Redwood, MD  Cardiologist:  Glenetta Hew, MD Primary HF: Dr. Aundra Dubin   History of Present Illness: Elaine [name] is a 80 y.o. with history of CAD s/p CABG, COPD, CKD stage 3, and chronic systolic systolic CHF/ischemic cardiomyopathy.  She was referred by Dr. Ellyn Hack for evaluation of CHF.  Patient was admitted in 2/15 with atrial fibrillation/RVR and chest pain.  Cath showed severe 3VD and she had CABG x 3.  Echo in 2/15 showed EF down to 20-25%. In 2018, it appears that she went back into atrial fibrillation and has remained in atrial fibrillation persistently.  DCCV was not attempted.   Most recent echo in 10/20 showed EF < 20% with normal RV.  She had a St Jude CRT-D device implanted in 11/20.  She says that she feels "90%" better with CRT.  She is followed by Dr. Hollie Salk with nephrology.   She has been in atrial fibrillation persistently, I attempted DCCV while on amiodarone but she did not convert.  Therefore, I stopped amiodarone.    Echo in 3/21 showed EF 20% with diffuse hypokinesis, mildly dilated RV with mildly decreased systolic function, PASP 49 mmHg, dilated IVC.  In 9/21, she had AV nodal ablation.    She stopped empagliflozin due to UTIs and yeast infections.   Echo was done today and reviewed, EF 20-25%, mildly decreased RV function.   She returns now for followup of CHF.  Ambulation is limited by neuropathy and ankle pain/arthritis.  She seems to be doing pretty well.  She walks with a walker, no dyspnea when walking around the house.  No lightheadedness.  No chest pain.  She is not very active due to ankle and leg pain. No orthopnea/PND.  Weight is down 4 lbs.   Labs (2/21): K 4.5, creatinine 2.08 => 2.99, hgb 12.8 Labs (7/21): K 4, creatinine 2.3, TSH normal Labs (12/21): K 4, creatinine 2.31, BNP 671 Labs (1/22): K 4.1,  creatinine 2.48  Labs (3/22): LDL 77 Labs (6/22): K 4.9, creatinine 2.26 Labs (10/22): K 4.8, creatinine 2.41  PMH: 1. CAD: s/p CABG in 2/15 with LIMA-LAD, SVG-OM2, SVG-PLV.  2. LBBB: Chronic.  3. Atrial fibrillation: Persistent since 2018.  - LA appendage clip with CABG.  4. H/o aspiration PNA 5. Type 2 diabetes 6. COPD 7. CKD stage 3 8. Chronic systolic CHF: Ischemic cardiomyopathy.  She has a St Jude BiV ICD.  - Echo (2/15): EF 20-25%.  - Echo (5/15): EF 40-45% - Echo (10/20): EF <20%, mild LVH, normal RV size and systolic function.  - Echo (3/21): EF 20% with diffuse hypokinesis, mildly dilated RV with mildly decreased systolic function, PASP 49 mmHg, dilated IVC.  - AV nodal ablation - Echo (12/22): EF 20-25%, mildly decreased RV function. 9. CKD: Stage 3.   Social History   Socioeconomic History   Marital status: Widowed    Spouse name: Not on file   Number of children: Not on file   Years of education: Not on file   Highest education level: Not on file  Occupational History   Occupation: retired  Tobacco Use   Smoking status: Former    Packs/day: 0.50    Years: 39.00    Pack years: 19.50    Types: Cigarettes    Quit date: 08/28/1993    Years since quitting: 28.0   Smokeless  tobacco: Never  Vaping Use   Vaping Use: Never used  Substance and Sexual Activity   Alcohol use: No    Comment: quit 95   Drug use: No   Sexual activity: Not on file  Other Topics Concern   Not on file  Social History Narrative   Lives with husband and son in [place].  She is currently retired.   Former smoker quit in 1995.   Social Determinants of Health   Financial Resource Strain: Not on file  Food Insecurity: Not on file  Transportation Needs: Not on file  Physical Activity: Not on file  Stress: Not on file  Social Connections: Not on file  Intimate Partner Violence: Not on file   Family History  Adopted: Yes  Problem Relation Age of Onset   Other Mother     Hypertension Mother    Colon cancer Neg Hx    Esophageal cancer Neg Hx    Rectal cancer Neg Hx    Stomach cancer Neg Hx    Current Outpatient Medications  Medication Sig Dispense Refill   acetaminophen (TYLENOL) 650 MG CR tablet Take 1,300 mg by mouth 2 (two) times daily.     albuterol (VENTOLIN HFA) 108 (90 Base) MCG/ACT inhaler Inhale 2 puffs into the lungs every 6 (six) hours as needed for wheezing or shortness of breath.     apixaban (ELIQUIS) 5 MG TABS tablet Take 2.5 mg by mouth 2 (two) times daily.     BIDIL 20-37.5 MG tablet Take 0.5 tablets by mouth 3 (three) times daily. 135 tablet 3   Coenzyme Q10 (COQ-10) 100 MG CAPS Take 100 mg by mouth in the morning and at bedtime.      colchicine 0.6 MG tablet Take 0.6 mg by mouth daily as needed (Gout).      Dulaglutide (TRULICITY ) Inject 1 Dose into the skin once a week.     ezetimibe (ZETIA) 10 MG tablet Take 10 mg by mouth daily.     ferrous sulfate 325 (65 FE) MG tablet Take 325 mg by mouth every other day.     levothyroxine (SYNTHROID) 112 MCG tablet Take 1 tablet (112 mcg total) by mouth daily before breakfast. 30 tablet 12   metolazone (ZAROXOLYN) 2.5 MG tablet Take only as directed by CHF clinic. 10 tablet 0   metoprolol succinate (TOPROL-XL) 100 MG 24 hr tablet Take 1 tablet (100 mg total) by mouth every morning AND 0.5 tablets (50 mg total) every evening. Take with or immediately following a meal.. 135 tablet 3   Multiple Vitamin (MULTIVITAMIN WITH MINERALS) TABS tablet Take 1 tablet by mouth daily.     Multiple Vitamins-Minerals (PRESERVISION AREDS 2) CAPS Take 1 capsule by mouth 2 (two) times daily.     potassium chloride SA (KLOR-CON) 20 MEQ tablet Take 1 tablet by mouth twice daily 60 tablet 11   rosuvastatin (CRESTOR) 5 MG tablet Take 5 mg by mouth every other day.      torsemide (DEMADEX) 100 MG tablet Take 1 tablet (100 mg total) by mouth 2 (two) times daily. 180 tablet 3   umeclidinium-vilanterol (ANORO ELLIPTA) 62.5-25  MCG/INH AEPB Inhale 1 puff into the lungs daily.     No current facility-administered medications for this encounter.   BP 121/70    Pulse 73    Wt 100.2 kg (221 lb)    SpO2 98%    BMI 40.42 kg/m  Exam:   General: NAD Neck: JVP 8 cm, no thyromegaly or  thyroid nodule.  Lungs: Clear to auscultation bilaterally with normal respiratory effort. CV: Nondisplaced PMI.  Heart regular S1/S2, no S3/S4, no murmur.  Trace ankle edema.  No carotid bruit.  Normal pedal pulses.  Abdomen: Soft, nontender, no hepatosplenomegaly, no distention.  Skin: Intact without lesions or rashes.  Neurologic: Alert and oriented x 3.  Psych: Normal affect. Extremities: No clubbing or cyanosis.  HEENT: Normal.   Assessment/Plan: 1. Chronic systolic CHF: Ischemic cardiomyopathy.  St Jude CRT-D device, now s/p AV nodal ablation.  Echo in 3/21 showed EF 20% with diffuse hypokinesis, mildly dilated RV with mildly decreased systolic function, PASP 49 mmHg, dilated IVC.  Echo today showed EF 20-25%, mildly decreased RV function.  Stable NYHA class III symptoms, better since AVN ablation.  She is not significantly volume overloaded.  Meds limited by CKD stage IV.  - Continue torsemide 100 mg bid but check BMET today.  May need to cut back if creatinine is higher.   - Off empagliflozin due to yeast infection and UTI.  - She was unable to tolerate Bidil 1 tab tid, but she does fine with Bidil 1/2 tab tid so will continue.  - Continue Toprol XL 100 qam/50 qpm.   - Off spironolactone with side effects.  2. CAD: S/p CABG in 2015. No chest pain.  - No ASA given apixaban use.  - Continue Crestor 5 daily + Zetia, check lipids today.  3. Atrial fibrillation: Chronic now, looks like it has gone on since 2018.  I was unable to cardiovert her on amiodarone so it was stopped.  I do not think that I will be able to get her out of atrial fibrillation. She is now s/p AVN ablation to allow more BiV pacing.  - Continue apixaban. CBC today.   4. CKD: Stage 4.  BMET today.   Followup 2 months with APP.    Signed, Loralie Champagne, MD  08/24/2021  [facility] [address] Heart [facility] [place] [address] [phone] (office) [phone] (fax)

## 2021-08-25 ENCOUNTER — Other Ambulatory Visit (HOSPITAL_COMMUNITY): Payer: Self-pay

## 2021-08-25 ENCOUNTER — Telehealth (HOSPITAL_COMMUNITY): Payer: Self-pay

## 2021-08-25 MED ORDER — TORSEMIDE 20 MG PO TABS
80.0000 mg | ORAL_TABLET | Freq: Two times a day (BID) | ORAL | 3 refills | Status: DC
Start: 2021-08-25 — End: 2021-10-26

## 2021-08-25 MED ORDER — TORSEMIDE 40 MG PO TABS: 80.0000 mg | ORAL_TABLET | Freq: Two times a day (BID) | ORAL | 2 refills | Status: DC

## 2021-08-25 NOTE — Telephone Encounter (Signed)
Pt aware, agreeable, and verbalized understanding Script sent to pharmacy.   Decrease torsemide to 80 mg bid. BMET 10 days. Dr. Loralie Champagne

## 2021-09-05 ENCOUNTER — Ambulatory Visit (INDEPENDENT_AMBULATORY_CARE_PROVIDER_SITE_OTHER): Payer: Medicare Other

## 2021-09-05 DIAGNOSIS — Z95810 Presence of automatic (implantable) cardiac defibrillator: Secondary | ICD-10-CM

## 2021-09-05 DIAGNOSIS — I5022 Chronic systolic (congestive) heart failure: Secondary | ICD-10-CM

## 2021-09-07 ENCOUNTER — Ambulatory Visit (INDEPENDENT_AMBULATORY_CARE_PROVIDER_SITE_OTHER): Payer: Commercial Managed Care - HMO

## 2021-09-07 ENCOUNTER — Telehealth: Payer: Self-pay

## 2021-09-07 ENCOUNTER — Other Ambulatory Visit: Payer: Self-pay | Admitting: Internal Medicine

## 2021-09-07 DIAGNOSIS — Z1231 Encounter for screening mammogram for malignant neoplasm of breast: Secondary | ICD-10-CM

## 2021-09-07 DIAGNOSIS — Z95810 Presence of automatic (implantable) cardiac defibrillator: Secondary | ICD-10-CM | POA: Diagnosis not present

## 2021-09-07 NOTE — Telephone Encounter (Signed)
Remote ICM transmission received.  Attempted call to patient regarding ICM remote transmission and left detailed message per DPR.  Advised to return call for any fluid symptoms or questions. Next ICM remote transmission scheduled 09/27/2021.

## 2021-09-07 NOTE — Progress Notes (Signed)
EPIC Encounter for ICM Monitoring  Patient Name: Elaine [name] is a 80 y.o. female Date: 09/07/2021 Primary Care Physican: Ginger Organ., MD Primary Cardiologist: [facility] Electrophysiologist: Cyndi Bender Pacing: >99% 08/24/2021 Office Weight: 221 lbs                                                            Attempted call to patient and unable to reach.  Left detailed message per DPR regarding transmission. Transmission reviewed.    CorVue thoracic impedance normal since 1/7 but was suggesting possible fluid accumulation from 12/30-1/6.  Impedance was suggesting possible dryness from 12/4-12/14 and  12/19-12/29.   Prescribed:   Torsemide 20 mg take 4 tablets (80 mg total) by mouth twice a day.  Potassium 20 mEq 1 tablet by mouth twice daily Metolazone 2.5 mg Take only as directed by CHF clinic.   Labs: 08/24/2021 Creatinine 2.62, BUN 80, Potassium 3.8, Sodium 132, GFR 19 06/01/2021 Creatinine 2.41, BUN 39, Potassium 4.8, Sodium 135, GFR 20 05/18/2021 Creatinine 2.23, BUN 38, Potassium 5.0, Sodium 136, GFR 22 02/15/2021 Creatinine 2.06, BUN 33, Potassium 4.9, Sodium 136, GFR 24 12/17/2020 Creatinine 2.35, BUN 48, Potassium 3.8, Sodium 136, GFR 20 11/23/2020 Creatinine 2.59, BUN 44, Potassium 4.1, Sodium 136, GFR 18 A complete set of results can be found in Results Review.   Recommendations:  Left voice mail with ICM number and encouraged to call if experiencing any fluid symptoms.   Follow-up plan: ICM clinic phone appointment on 09/27/2021 to recheck fluid levels.   91 day device clinic remote transmission 12/07/2021.     EP/Cardiology Office Visits:  10/26/2021 with Dr Aundra Dubin.      Copy of ICM check sent to Dr. Curt Bears.   3 month ICM trend: 09/05/2021.    12-14 Month ICM trend:     [name], RN 09/07/2021 4:09 PM

## 2021-09-09 ENCOUNTER — Telehealth (HOSPITAL_COMMUNITY): Payer: Self-pay

## 2021-09-09 ENCOUNTER — Other Ambulatory Visit: Payer: Self-pay

## 2021-09-09 ENCOUNTER — Ambulatory Visit (HOSPITAL_COMMUNITY)
Admission: RE | Admit: 2021-09-09 | Discharge: 2021-09-09 | Disposition: A | Discharge status: Home / Self Care | Payer: Commercial Managed Care - HMO | Source: Ambulatory Visit | Attending: Internal Medicine | Admitting: Internal Medicine

## 2021-09-09 DIAGNOSIS — I5042 Chronic combined systolic (congestive) and diastolic (congestive) heart failure: Secondary | ICD-10-CM | POA: Diagnosis not present

## 2021-09-09 LAB — CUP PACEART REMOTE DEVICE CHECK
Battery Remaining Longevity: 71 mo
Battery Remaining Percentage: 71 %
Battery Voltage: 2.98 V
Date Time Interrogation Session: 20230112141754
HighPow Impedance: 63 Ohm
Implantable Lead Implant Date: 20201130
Implantable Lead Implant Date: 20201130
Implantable Lead Location: 753858
Implantable Lead Location: 753860
Implantable Pulse Generator Implant Date: 20201130
Lead Channel Impedance Value: 490 Ohm
Lead Channel Impedance Value: 540 Ohm
Lead Channel Pacing Threshold Amplitude: 0.75 V
Lead Channel Pacing Threshold Amplitude: 0.75 V
Lead Channel Pacing Threshold Pulse Width: 0.4 ms
Lead Channel Pacing Threshold Pulse Width: 0.4 ms
Lead Channel Sensing Intrinsic Amplitude: 12 mV
Lead Channel Setting Pacing Amplitude: 1.25 V
Lead Channel Setting Pacing Amplitude: 2.5 V
Lead Channel Setting Pacing Pulse Width: 0.4 ms
Lead Channel Setting Pacing Pulse Width: 0.4 ms
Lead Channel Setting Sensing Sensitivity: 0.5 mV
Pulse Gen Serial Number: 810000193

## 2021-09-09 LAB — BASIC METABOLIC PANEL
Anion gap: 13 (ref 5–15)
BUN: 75 mg/dL — ABNORMAL HIGH (ref 8–23)
CO2: 30 mmol/L (ref 22–32)
Calcium: 10 mg/dL (ref 8.9–10.3)
Chloride: 93 mmol/L — ABNORMAL LOW (ref 98–111)
Creatinine, Ser: 2.74 mg/dL — ABNORMAL HIGH (ref 0.44–1.00)
GFR, Estimated: 17 mL/min — ABNORMAL LOW (ref >60)
Glucose, Bld: 125 mg/dL — ABNORMAL HIGH (ref 70–99)
Potassium: 4.2 mmol/L (ref 3.5–5.1)
Sodium: 136 mmol/L (ref 135–145)

## 2021-09-09 NOTE — Telephone Encounter (Addendum)
Pt aware, agreeable, and verbalized understanding Follow up lab appointment scheduled.   ----- Message from Larey Dresser, MD sent at 09/09/2021 11:12 AM EST ----- Decrease torsemide to 60 bid x 2 days then 80 qam/60 qpm after that.  BMET 10 days.

## 2021-09-16 NOTE — Progress Notes (Signed)
Remote ICD transmission.   

## 2021-09-21 ENCOUNTER — Other Ambulatory Visit: Payer: Self-pay

## 2021-09-21 ENCOUNTER — Ambulatory Visit (HOSPITAL_COMMUNITY)
Admission: RE | Admit: 2021-09-21 | Discharge: 2021-09-21 | Disposition: A | Discharge status: Home / Self Care | Payer: Medicare Other | Source: Ambulatory Visit | Attending: Cardiology | Admitting: Cardiology

## 2021-09-21 DIAGNOSIS — I5042 Chronic combined systolic (congestive) and diastolic (congestive) heart failure: Secondary | ICD-10-CM | POA: Diagnosis present

## 2021-09-21 LAB — BASIC METABOLIC PANEL
Anion gap: 9 (ref 5–15)
BUN: 37 mg/dL — ABNORMAL HIGH (ref 8–23)
CO2: 27 mmol/L (ref 22–32)
Calcium: 9.6 mg/dL (ref 8.9–10.3)
Chloride: 100 mmol/L (ref 98–111)
Creatinine, Ser: 2.28 mg/dL — ABNORMAL HIGH (ref 0.44–1.00)
GFR, Estimated: 21 mL/min — ABNORMAL LOW (ref >60)
Glucose, Bld: 122 mg/dL — ABNORMAL HIGH (ref 70–99)
Potassium: 4.4 mmol/L (ref 3.5–5.1)
Sodium: 136 mmol/L (ref 135–145)

## 2021-09-27 ENCOUNTER — Ambulatory Visit (INDEPENDENT_AMBULATORY_CARE_PROVIDER_SITE_OTHER): Payer: Commercial Managed Care - HMO

## 2021-09-27 DIAGNOSIS — Z95810 Presence of automatic (implantable) cardiac defibrillator: Secondary | ICD-10-CM

## 2021-09-27 DIAGNOSIS — I5042 Chronic combined systolic (congestive) and diastolic (congestive) heart failure: Secondary | ICD-10-CM

## 2021-09-27 NOTE — Progress Notes (Signed)
EPIC Encounter for ICM Monitoring  Patient Name: Elaine [name] is a 81 y.o. female Date: 09/27/2021 Primary Care Physican: Ginger Organ., MD Primary Cardiologist: [facility] Electrophysiologist: Cyndi Bender Pacing: 98% 08/24/2021 Office Weight: 221 lbs 09/27/2021 Weight: 225 lbs                                                            Spoke with patient and heart failure questions reviewed.  Pt asymptomatic for fluid accumulation.  Reports feeling well at this time and voices no complaints.   Eats fast food 2-3 times a week.  Highest weight was 227 lbs this past week but dropped to 225 lbs   CorVue thoracic impedance normal but was suggesting possible fluid accumulation from 12/30-1/7 and 1/14-1/28.   Prescribed:   Torsemide 20 mg take 4 tablets (80 mg total) by mouth twice a day.  Potassium 20 mEq 1 tablet by mouth twice daily Metolazone 2.5 mg Take only as directed by CHF clinic.   Labs: 09/21/2021 Creatinine 2.28, BUN 37, Potassium 4.4, Sodium 136, GFR 21 09/09/2021 Creatinine 2.74, BUN 75, Potassium 4.2, Sodium 136, GFR 17 08/24/2021 Creatinine 2.62, BUN 80, Potassium 3.8, Sodium 132, GFR 19 06/01/2021 Creatinine 2.41, BUN 39, Potassium 4.8, Sodium 135, GFR 20 05/18/2021 Creatinine 2.23, BUN 38, Potassium 5.0, Sodium 136, GFR 22 02/15/2021 Creatinine 2.06, BUN 33, Potassium 4.9, Sodium 136, GFR 24 12/17/2020 Creatinine 2.35, BUN 48, Potassium 3.8, Sodium 136, GFR 20 11/23/2020 Creatinine 2.59, BUN 44, Potassium 4.1, Sodium 136, GFR 18 A complete set of results can be found in Results Review.   Recommendations:   Recommendation to limit salt intake to 2000 mg daily and fluid intake to 64 oz daily.  Encouraged to call if experiencing any fluid symptoms.    Follow-up plan: ICM clinic phone appointment on 10/17/2021.   91 day device clinic remote transmission 12/07/2021.     EP/Cardiology Office Visits:  10/26/2021 with Dr Aundra Dubin.      Copy of ICM check sent to Dr.  Curt Bears.   3 month ICM trend: 09/27/2021.    12-14 Month ICM trend:     [name], RN 09/27/2021 4:10 PM

## 2021-10-10 ENCOUNTER — Ambulatory Visit
Admission: RE | Admit: 2021-10-10 | Discharge: 2021-10-10 | Disposition: A | Discharge status: Home / Self Care | Payer: Medicare Other | Source: Ambulatory Visit | Attending: Internal Medicine | Admitting: Internal Medicine

## 2021-10-10 ENCOUNTER — Other Ambulatory Visit: Payer: Self-pay

## 2021-10-10 DIAGNOSIS — Z1231 Encounter for screening mammogram for malignant neoplasm of breast: Secondary | ICD-10-CM

## 2021-10-11 ENCOUNTER — Other Ambulatory Visit (HOSPITAL_COMMUNITY): Payer: Self-pay | Admitting: Cardiology

## 2021-10-25 ENCOUNTER — Ambulatory Visit (INDEPENDENT_AMBULATORY_CARE_PROVIDER_SITE_OTHER): Payer: Medicare Other

## 2021-10-25 DIAGNOSIS — I5042 Chronic combined systolic (congestive) and diastolic (congestive) heart failure: Secondary | ICD-10-CM

## 2021-10-25 DIAGNOSIS — Z95810 Presence of automatic (implantable) cardiac defibrillator: Secondary | ICD-10-CM

## 2021-10-25 NOTE — Progress Notes (Signed)
ID:  Elaine [name], DOB 16-Nov-1940, MRN [id number]  Provider location: [facility] Type of Visit: Established patient  PCP:  Marton Redwood, MD  Cardiologist:  Glenetta Hew, MD Primary HF: Dr. Aundra Dubin   History of Present Illness: Elaine [name] is a 81 y.o. with history of CAD s/p CABG, COPD, CKD stage 3, and chronic systolic systolic CHF/ischemic cardiomyopathy.  She was referred by Dr. Ellyn Hack for evaluation of CHF.  Patient was admitted in 2/15 with atrial fibrillation/RVR and chest pain.  Cath showed severe 3VD and she had CABG x 3.  Echo in 2/15 showed EF down to 20-25%. In 2018, it appears that she went back into atrial fibrillation and has remained in atrial fibrillation persistently.  DCCV was not attempted.   Most recent echo in 10/20 showed EF < 20% with normal RV.  She had a St Jude CRT-D device implanted in 11/20.  She says that she feels "90%" better with CRT.  She is followed by Dr. Hollie Salk with nephrology.   She has been in atrial fibrillation persistently, I attempted DCCV while on amiodarone but she did not convert.  Therefore, I stopped amiodarone.    Echo in 3/21 showed EF 20% with diffuse hypokinesis, mildly dilated RV with mildly decreased systolic function, PASP 49 mmHg, dilated IVC.  In 9/21, she had AV nodal ablation.    She stopped empagliflozin due to UTIs and yeast infections.   Echo 12/22 EF 20-25%, mildly decreased RV function.   Today she returns for HF follow up. She recently celebrated her 81st birthday at [facility] and weight was up 5 lbs this morning so she took a metolazone with 20 of KCL. Uses a walker to get around the house and no dyspnea with this. She uses a wheelchair when out of the house due to chronic hip pain. Overall feeling fine. Denies CP, dizziness, abnormal bleeding or PND/Orthopnea. Appetite ok. No fever or chills. Weight at home 220-226 pounds. Taking all medications.   Labs (2/21): K 4.5, creatinine 2.08 => 2.99, hgb  12.8 Labs (7/21): K 4, creatinine 2.3, TSH normal Labs (12/21): K 4, creatinine 2.31, BNP 671 Labs (1/22): K 4.1, creatinine 2.48  Labs (3/22): LDL 77 Labs (6/22): K 4.9, creatinine 2.26 Labs (10/22): K 4.8, creatinine 2.41 Labs (12/22): LDL 62, hgb 13.5 Labs (2/23): K 4.4, creatinine 2.28  PMH: 1. CAD: s/p CABG in 2/15 with LIMA-LAD, SVG-OM2, SVG-PLV.  2. LBBB: Chronic.  3. Atrial fibrillation: Persistent since 2018.  - LA appendage clip with CABG.  4. H/o aspiration PNA 5. Type 2 diabetes 6. COPD 7. CKD stage 3 8. Chronic systolic CHF: Ischemic cardiomyopathy.  She has a St Jude BiV ICD.  - Echo (2/15): EF 20-25%.  - Echo (5/15): EF 40-45% - Echo (10/20): EF <20%, mild LVH, normal RV size and systolic function.  - Echo (3/21): EF 20% with diffuse hypokinesis, mildly dilated RV with mildly decreased systolic function, PASP 49 mmHg, dilated IVC.  - AV nodal ablation - Echo (12/22): EF 20-25%, mildly decreased RV function. 9. CKD: Stage 3.   Social History   Socioeconomic History   Marital status: Widowed    Spouse name: Not on file   Number of children: Not on file   Years of education: Not on file   Highest education level: Not on file  Occupational History   Occupation: retired  Tobacco Use   Smoking status: Former    Packs/day: 0.50    Years:  39.00    Pack years: 19.50    Types: Cigarettes    Quit date: 08/28/1993    Years since quitting: 28.1   Smokeless tobacco: Never  Vaping Use   Vaping Use: Never used  Substance and Sexual Activity   Alcohol use: No    Comment: quit 95   Drug use: No   Sexual activity: Not on file  Other Topics Concern   Not on file  Social History Narrative   Lives with husband and son in [place].  She is currently retired.   Former smoker quit in 1995.   Social Determinants of Health   Financial Resource Strain: Not on file  Food Insecurity: Not on file  Transportation Needs: Not on file  Physical Activity: Not on file   Stress: Not on file  Social Connections: Not on file  Intimate Partner Violence: Not on file   Family History  Adopted: Yes  Problem Relation Age of Onset   Other Mother    Hypertension Mother    Colon cancer Neg Hx    Esophageal cancer Neg Hx    Rectal cancer Neg Hx    Stomach cancer Neg Hx    Current Outpatient Medications  Medication Sig Dispense Refill   acetaminophen (TYLENOL) 650 MG CR tablet Take 1,300 mg by mouth 2 (two) times daily.     albuterol (VENTOLIN HFA) 108 (90 Base) MCG/ACT inhaler Inhale 2 puffs into the lungs every 6 (six) hours as needed for wheezing or shortness of breath.     apixaban (ELIQUIS) 2.5 MG TABS tablet Take 2.5 mg by mouth 2 (two) times daily.     BIDIL 20-37.5 MG tablet Take 0.5 tablets by mouth 3 (three) times daily. 135 tablet 3   Coenzyme Q10 (COQ-10) 100 MG CAPS Take 100 mg by mouth in the morning and at bedtime.      colchicine 0.6 MG tablet Take 0.6 mg by mouth daily as needed (Gout).      Dulaglutide (TRULICITY Kimberly) Inject 1 Dose into the skin once a week.     ezetimibe (ZETIA) 10 MG tablet Take 10 mg by mouth daily.     ferrous sulfate 325 (65 FE) MG tablet Take 325 mg by mouth every other day.     levothyroxine (SYNTHROID) 112 MCG tablet Take 1 tablet (112 mcg total) by mouth daily before breakfast. 30 tablet 12   metolazone (ZAROXOLYN) 2.5 MG tablet TAKE ONLY AS DIRECTED BY CHF CLINIC 10 tablet 0   metoprolol succinate (TOPROL-XL) 100 MG 24 hr tablet Take 1 tablet (100 mg total) by mouth every morning AND 0.5 tablets (50 mg total) every evening. Take with or immediately following a meal.. 135 tablet 3   Multiple Vitamin (MULTIVITAMIN WITH MINERALS) TABS tablet Take 1 tablet by mouth daily.     Multiple Vitamins-Minerals (PRESERVISION AREDS 2) CAPS Take 1 capsule by mouth 2 (two) times daily.     potassium chloride SA (KLOR-CON) 20 MEQ tablet Take 1 tablet by mouth twice daily 60 tablet 11   rosuvastatin (CRESTOR) 5 MG tablet Take 5 mg by  mouth every other day.      torsemide (DEMADEX) 20 MG tablet Take 20 mg by mouth. 80 mg in the AM and 60 mg in the PM     umeclidinium-vilanterol (ANORO ELLIPTA) 62.5-25 MCG/INH AEPB Inhale 1 puff into the lungs daily.     No current facility-administered medications for this encounter.   Wt Readings from Last 3 Encounters:  10/26/21  102.6 kg (226 lb 3.2 oz)  08/24/21 100.2 kg (221 lb)  07/07/21 103 kg (227 lb)   BP (!) 118/52    Pulse 73    Wt 102.6 kg (226 lb 3.2 oz)    SpO2 97%    BMI 41.37 kg/m  Physical Exam:   General:  NAD. No resp difficulty, arrived in [facility] HEENT: opaque R eye Neck: Supple. JVP 8. Carotids 2+ bilat; no bruits. No lymphadenopathy or thryomegaly appreciated. Cor: PMI nondisplaced. Regular rate & rhythm. No rubs, gallops or murmurs. Lungs: Clear Abdomen: Obese, nontender, nondistended. No hepatosplenomegaly. No bruits or masses. Good bowel sounds. Extremities: No cyanosis, clubbing, rash, 1-2+ BLE edema Neuro: Alert & oriented x 3, cranial nerves grossly intact. Moves all 4 extremities w/o difficulty. Affect pleasant.  Assessment/Plan: 1. Chronic systolic CHF: Ischemic cardiomyopathy.  St Jude CRT-D device, now s/p AV nodal ablation.  Echo in 3/21 showed EF 20% with diffuse hypokinesis, mildly dilated RV with mildly decreased systolic function, PASP 49 mmHg, dilated IVC.  Echo today showed EF 20-25%, mildly decreased RV function.  Stable NYHA class III symptoms, better since AVN ablation.  She is volume overloaded on exam, weight up 5-6 lbs likely secondary to recent birthday celebration and dietary indiscretion.  Meds limited by CKD stage IV.  - She took a dose of metolazone today at home. Needs to take 40 KCL today. BMET/BNP today.  - Continue torsemide 80mg  qam/60 mg qpm for now, may need to increase further. Will await labs. - She was unable to tolerate Bidil 1 tab tid, but she does fine with Bidil 1/2 tab tid so will continue.  - Continue Toprol XL 100 qam/50  qpm.   - Off empagliflozin due to yeast infection and UTI.  - Off spironolactone with side effects.  2. CAD: S/p CABG in 2015. No chest pain.  - No ASA given apixaban use.  - Continue Crestor 5 daily + Zetia, good lipids 12/22. 3. Atrial fibrillation: Chronic now, looks like it has gone on since 2018.  I was unable to cardiovert her on amiodarone so it was stopped.  I do not think that I will be able to get her out of atrial fibrillation. She is now s/p AVN ablation to allow more BiV pacing.  - Continue apixaban. No bleeding issues. Recent CBC ok. 4. CKD: Stage 4.  BMET today.   Follow up in 2 weeks with APP (re-check volume, consider increasing torsemide) and 2 months with Dr. Aundra Dubin.  Signed, Elaine Bihari, FNP  10/26/2021  [facility] [address] Heart and [place] [address] [phone] (office) [phone] (fax)

## 2021-10-26 ENCOUNTER — Encounter (HOSPITAL_COMMUNITY): Payer: Self-pay

## 2021-10-26 ENCOUNTER — Ambulatory Visit (HOSPITAL_COMMUNITY)
Admission: RE | Admit: 2021-10-26 | Discharge: 2021-10-26 | Disposition: A | Discharge status: Home / Self Care | Payer: Medicare Other | Source: Ambulatory Visit | Attending: Internal Medicine | Admitting: Internal Medicine

## 2021-10-26 ENCOUNTER — Other Ambulatory Visit: Payer: Self-pay

## 2021-10-26 VITALS — BP 118/52 | HR 73 | Wt 226.2 lb

## 2021-10-26 DIAGNOSIS — Z7901 Long term (current) use of anticoagulants: Secondary | ICD-10-CM | POA: Insufficient documentation

## 2021-10-26 DIAGNOSIS — Z7985 Long-term (current) use of injectable non-insulin antidiabetic drugs: Secondary | ICD-10-CM | POA: Diagnosis not present

## 2021-10-26 DIAGNOSIS — N1832 Chronic kidney disease, stage 3b: Secondary | ICD-10-CM

## 2021-10-26 DIAGNOSIS — J449 Chronic obstructive pulmonary disease, unspecified: Secondary | ICD-10-CM | POA: Diagnosis not present

## 2021-10-26 DIAGNOSIS — I482 Chronic atrial fibrillation, unspecified: Secondary | ICD-10-CM | POA: Insufficient documentation

## 2021-10-26 DIAGNOSIS — E1122 Type 2 diabetes mellitus with diabetic chronic kidney disease: Secondary | ICD-10-CM | POA: Diagnosis not present

## 2021-10-26 DIAGNOSIS — I255 Ischemic cardiomyopathy: Secondary | ICD-10-CM | POA: Diagnosis not present

## 2021-10-26 DIAGNOSIS — Z79899 Other long term (current) drug therapy: Secondary | ICD-10-CM | POA: Insufficient documentation

## 2021-10-26 DIAGNOSIS — Z87891 Personal history of nicotine dependence: Secondary | ICD-10-CM | POA: Insufficient documentation

## 2021-10-26 DIAGNOSIS — I4821 Permanent atrial fibrillation: Secondary | ICD-10-CM

## 2021-10-26 DIAGNOSIS — M25559 Pain in unspecified hip: Secondary | ICD-10-CM | POA: Insufficient documentation

## 2021-10-26 DIAGNOSIS — I2510 Atherosclerotic heart disease of native coronary artery without angina pectoris: Secondary | ICD-10-CM | POA: Insufficient documentation

## 2021-10-26 DIAGNOSIS — I5022 Chronic systolic (congestive) heart failure: Secondary | ICD-10-CM | POA: Insufficient documentation

## 2021-10-26 DIAGNOSIS — N184 Chronic kidney disease, stage 4 (severe): Secondary | ICD-10-CM | POA: Insufficient documentation

## 2021-10-26 DIAGNOSIS — Z951 Presence of aortocoronary bypass graft: Secondary | ICD-10-CM | POA: Diagnosis not present

## 2021-10-26 DIAGNOSIS — G8929 Other chronic pain: Secondary | ICD-10-CM | POA: Insufficient documentation

## 2021-10-26 LAB — BASIC METABOLIC PANEL
Anion gap: 14 (ref 5–15)
BUN: 81 mg/dL — ABNORMAL HIGH (ref 8–23)
CO2: 29 mmol/L (ref 22–32)
Calcium: 9.7 mg/dL (ref 8.9–10.3)
Chloride: 93 mmol/L — ABNORMAL LOW (ref 98–111)
Creatinine, Ser: 2.66 mg/dL — ABNORMAL HIGH (ref 0.44–1.00)
GFR, Estimated: 17 mL/min — ABNORMAL LOW (ref >60)
Glucose, Bld: 117 mg/dL — ABNORMAL HIGH (ref 70–99)
Potassium: 3.2 mmol/L — ABNORMAL LOW (ref 3.5–5.1)
Sodium: 136 mmol/L (ref 135–145)

## 2021-10-26 LAB — BRAIN NATRIURETIC PEPTIDE: B Natriuretic Peptide: 479.3 pg/mL — ABNORMAL HIGH (ref 0.0–100.0)

## 2021-10-26 NOTE — Patient Instructions (Addendum)
Thank you for coming in today ? ?Labs were done today, if any labs are abnormal the clinic will call you ? ? ? ?Your physician recommends that you schedule a follow-up appointment in:  ?2-3 weeks in clinic ?3 months with Dr. Aundra Dubin  ? ?At the [facility], you and your health needs are our priority. As part of our continuing mission to provide you with exceptional heart care, we have created designated Provider Care Teams. These Care Teams include your primary Cardiologist (physician) and Advanced Practice Providers (APPs- Physician Assistants and Nurse Practitioners) who all work together to provide you with the care you need, when you need it.  ? ?You may see any of the following providers on your designated Care Team at your next follow up: ?Dr Glori Bickers ?Dr Loralie Champagne ?Darrick Grinder, NP ?Lyda Jester, PA ?Jessica Milford,NP ?Marlyce Huge, PA ?Audry Riles, PharmD ? ? ?Please be sure to bring in all your medications bottles to every appointment.  ? ?If you have any questions or concerns before your next appointment please send [place] a message through [place] or call our office at [phone].   ? ?TO LEAVE A MESSAGE FOR THE NURSE SELECT OPTION 2, PLEASE LEAVE A MESSAGE INCLUDING: ?YOUR NAME ?DATE OF BIRTH ?CALL BACK NUMBER ?REASON FOR CALL**this is important as we prioritize the call backs ? ?YOU WILL RECEIVE A CALL BACK THE SAME DAY AS LONG AS YOU CALL BEFORE 4:00 PM ? ?

## 2021-10-28 NOTE — Progress Notes (Addendum)
EPIC Encounter for ICM Monitoring  Patient Name: Elaine [name] is a 81 y.o. female Date: 10/28/2021 Primary Care Physican: Ginger Organ., MD Primary Cardiologist: Harding/McLean Electrophysiologist: Cyndi Bender Pacing: 98% 08/24/2021 Office Weight: 221 lbs 09/27/2021 Weight: 225 lbs                                                            Transmission reviewed.  [facility]1 and fluid levels were checked.   CorVue thoracic impedance normal but was suggesting possible fluid accumulation from 2/24-2/28 and per 3/1 [facility] OV note she took Metolazone due to fluid weight gain.   Prescribed:   Torsemide 20 mg take 4 tablets (80 mg total) by mouth twice a day.  Potassium 20 mEq 1 tablet by mouth twice daily Metolazone 2.5 mg Take only as directed by CHF clinic.   Labs: 10/26/2021 Creatinine 2.66, BUN 81, Potassium 3.2, Sodium 136, GFR 17 09/21/2021 Creatinine 2.28, BUN 37, Potassium 4.4, Sodium 136, GFR 21 09/09/2021 Creatinine 2.74, BUN 75, Potassium 4.2, Sodium 136, GFR 17 A complete set of results can be found in Results Review.   Recommendations:   Recommendation given at 3/1 OV.      Follow-up plan: ICM clinic phone appointment on 11/28/2021.   91 day device clinic remote transmission 12/07/2021.     EP/Cardiology Office Visits:  11/17/2021 with HF clinic.   01/27/2022 with Dr Aundra Dubin.      Copy of ICM check sent to Dr. Curt Bears.   3 month ICM trend: 10/28/2021.    12-14 Month ICM trend:     [name], RN 10/28/2021 4:12 PM

## 2021-10-29 ENCOUNTER — Other Ambulatory Visit (HOSPITAL_COMMUNITY): Payer: Self-pay | Admitting: Cardiology

## 2021-10-31 ENCOUNTER — Telehealth (HOSPITAL_COMMUNITY): Payer: Self-pay | Admitting: Surgery

## 2021-10-31 DIAGNOSIS — I5022 Chronic systolic (congestive) heart failure: Secondary | ICD-10-CM

## 2021-10-31 NOTE — Telephone Encounter (Signed)
I was able to reach patient to review lab work--she had previously received message about the extra Potassium doses and completed that yesterday.  I schedule lab appt for Friday and entered orders in [facility]. ?

## 2021-11-04 ENCOUNTER — Telehealth (HOSPITAL_COMMUNITY): Payer: Self-pay | Admitting: Surgery

## 2021-11-04 ENCOUNTER — Ambulatory Visit (HOSPITAL_COMMUNITY)
Admission: RE | Admit: 2021-11-04 | Discharge: 2021-11-04 | Disposition: A | Discharge status: Home / Self Care | Payer: Medicare Other | Source: Ambulatory Visit | Attending: Cardiology | Admitting: Cardiology

## 2021-11-04 ENCOUNTER — Telehealth (HOSPITAL_COMMUNITY): Payer: Self-pay

## 2021-11-04 ENCOUNTER — Other Ambulatory Visit: Payer: Self-pay

## 2021-11-04 DIAGNOSIS — I5022 Chronic systolic (congestive) heart failure: Secondary | ICD-10-CM | POA: Insufficient documentation

## 2021-11-04 LAB — BASIC METABOLIC PANEL
Anion gap: 10 (ref 5–15)
BUN: 58 mg/dL — ABNORMAL HIGH (ref 8–23)
CO2: 30 mmol/L (ref 22–32)
Calcium: 9.5 mg/dL (ref 8.9–10.3)
Chloride: 96 mmol/L — ABNORMAL LOW (ref 98–111)
Creatinine, Ser: 2.32 mg/dL — ABNORMAL HIGH (ref 0.44–1.00)
GFR, Estimated: 21 mL/min — ABNORMAL LOW (ref >60)
Glucose, Bld: 157 mg/dL — ABNORMAL HIGH (ref 70–99)
Potassium: 3.2 mmol/L — ABNORMAL LOW (ref 3.5–5.1)
Sodium: 136 mmol/L (ref 135–145)

## 2021-11-04 MED ORDER — POTASSIUM CHLORIDE CRYS ER 20 MEQ PO TBCR: 60.0000 meq | EXTENDED_RELEASE_TABLET | Freq: Two times a day (BID) | ORAL | 5 refills | Status: DC

## 2021-11-04 NOTE — Telephone Encounter (Signed)
Patient called to review results and recommendations per Allena Katz NP.  I left a message for a return call. ?

## 2021-11-04 NOTE — Telephone Encounter (Signed)
-----   Message from Rafael Bihari, [place] sent at 11/04/2021 12:36 PM EST ----- ?K remains low. Is she taking her KCL suppl? If so, needs to take 60 mEq bid and repeat  BMET in 1 week. ?

## 2021-11-04 NOTE — Telephone Encounter (Signed)
-----   Message from Rafael Bihari, [place] sent at 11/04/2021 12:36 PM EST ----- ?K remains low. Is she taking her KCL suppl? If so, needs to take 60 mEq bid and repeat  BMET in 1 week. ?

## 2021-11-04 NOTE — Telephone Encounter (Signed)
Patient advised and verbalized understanding, confirms she has been taking K as prescribed. Med list updated to reflect changes, lab appt scheduled, lab order entered.  ? ?Meds ordered this encounter  ?Medications  ? potassium chloride SA (KLOR-CON M) 20 MEQ tablet  ?  Sig: Take 3 tablets (60 mEq total) by mouth 2 (two) times daily.  ?  Dispense:  180 tablet  ?  Refill:  5  ?  Please cancel all previous orders for current medication. Change in dosage or pill size.  ? ?Orders Placed This Encounter  ?Procedures  ? Basic metabolic panel  ?  Standing Status:   Future  ?  Standing Expiration Date:   11/05/2022  ?  Order Specific Question:   Release to patient  ?  Answer:   Immediate  ? ? ?

## 2021-11-11 ENCOUNTER — Other Ambulatory Visit (HOSPITAL_COMMUNITY): Payer: Self-pay

## 2021-11-11 ENCOUNTER — Telehealth (HOSPITAL_COMMUNITY): Payer: Self-pay

## 2021-11-11 ENCOUNTER — Other Ambulatory Visit: Payer: Self-pay

## 2021-11-11 ENCOUNTER — Ambulatory Visit (HOSPITAL_COMMUNITY)
Admission: RE | Admit: 2021-11-11 | Discharge: 2021-11-11 | Disposition: A | Discharge status: Home / Self Care | Payer: Medicare Other | Source: Ambulatory Visit | Attending: Cardiology | Admitting: Cardiology

## 2021-11-11 DIAGNOSIS — I5022 Chronic systolic (congestive) heart failure: Secondary | ICD-10-CM | POA: Diagnosis present

## 2021-11-11 LAB — BASIC METABOLIC PANEL
Anion gap: 10 (ref 5–15)
BUN: 53 mg/dL — ABNORMAL HIGH (ref 8–23)
CO2: 26 mmol/L (ref 22–32)
Calcium: 9.9 mg/dL (ref 8.9–10.3)
Chloride: 105 mmol/L (ref 98–111)
Creatinine, Ser: 2.49 mg/dL — ABNORMAL HIGH (ref 0.44–1.00)
GFR, Estimated: 19 mL/min — ABNORMAL LOW (ref >60)
Glucose, Bld: 109 mg/dL — ABNORMAL HIGH (ref 70–99)
Potassium: 5.8 mmol/L — ABNORMAL HIGH (ref 3.5–5.1)
Sodium: 141 mmol/L (ref 135–145)

## 2021-11-11 NOTE — Telephone Encounter (Signed)
-----   Message from Rafael Bihari, [place] sent at 11/11/2021 11:07 AM EDT ----- ?K is too high. Stop all KCL supplements. Repeat BMET in Monday. ?

## 2021-11-11 NOTE — Addendum Note (Signed)
Addended by: Rockwell Alexandria on: 11/11/2021 11:31 AM ? ? Modules accepted: Orders ? ?

## 2021-11-11 NOTE — Telephone Encounter (Signed)
Pt aware, agreeable, and verbalized understanding med list up dated labs scheduled for 11/14/21. ? ?

## 2021-11-12 ENCOUNTER — Other Ambulatory Visit (HOSPITAL_COMMUNITY): Payer: Self-pay | Admitting: Cardiology

## 2021-11-14 ENCOUNTER — Other Ambulatory Visit: Payer: Self-pay

## 2021-11-14 ENCOUNTER — Ambulatory Visit (HOSPITAL_COMMUNITY)
Admission: RE | Admit: 2021-11-14 | Discharge: 2021-11-14 | Disposition: A | Discharge status: Home / Self Care | Payer: Medicare Other | Source: Ambulatory Visit | Attending: Internal Medicine | Admitting: Internal Medicine

## 2021-11-14 DIAGNOSIS — I5022 Chronic systolic (congestive) heart failure: Secondary | ICD-10-CM | POA: Diagnosis present

## 2021-11-14 LAB — BASIC METABOLIC PANEL
Anion gap: 11 (ref 5–15)
BUN: 52 mg/dL — ABNORMAL HIGH (ref 8–23)
CO2: 27 mmol/L (ref 22–32)
Calcium: 9.8 mg/dL (ref 8.9–10.3)
Chloride: 100 mmol/L (ref 98–111)
Creatinine, Ser: 2.5 mg/dL — ABNORMAL HIGH (ref 0.44–1.00)
GFR, Estimated: 19 mL/min — ABNORMAL LOW (ref >60)
Glucose, Bld: 134 mg/dL — ABNORMAL HIGH (ref 70–99)
Potassium: 4.7 mmol/L (ref 3.5–5.1)
Sodium: 138 mmol/L (ref 135–145)

## 2021-11-15 ENCOUNTER — Telehealth (HOSPITAL_COMMUNITY): Payer: Self-pay

## 2021-11-15 DIAGNOSIS — I5022 Chronic systolic (congestive) heart failure: Secondary | ICD-10-CM

## 2021-11-15 NOTE — Telephone Encounter (Addendum)
Pt aware, agreeable, and verbalized understanding ? ?Lab appointment scheduled for 3/28 ? ?----- Message from Rafael Bihari, FNP sent at 11/15/2021  7:52 AM EDT ----- ?Potassium improved. Please repeat BMET in 2 weeks to follow potassium, may need to slowly add back supplementation, but remain off KCL for now ?

## 2021-11-17 ENCOUNTER — Other Ambulatory Visit: Payer: Self-pay

## 2021-11-17 ENCOUNTER — Encounter (HOSPITAL_COMMUNITY): Payer: Self-pay

## 2021-11-17 ENCOUNTER — Ambulatory Visit (HOSPITAL_COMMUNITY)
Admission: RE | Admit: 2021-11-17 | Discharge: 2021-11-17 | Disposition: A | Discharge status: Home / Self Care | Payer: Medicare Other | Source: Ambulatory Visit | Attending: Adult Health | Admitting: Adult Health

## 2021-11-17 VITALS — BP 122/60 | HR 73 | Wt 225.2 lb

## 2021-11-17 DIAGNOSIS — I4821 Permanent atrial fibrillation: Secondary | ICD-10-CM | POA: Diagnosis not present

## 2021-11-17 DIAGNOSIS — J449 Chronic obstructive pulmonary disease, unspecified: Secondary | ICD-10-CM | POA: Diagnosis not present

## 2021-11-17 DIAGNOSIS — N184 Chronic kidney disease, stage 4 (severe): Secondary | ICD-10-CM | POA: Diagnosis not present

## 2021-11-17 DIAGNOSIS — E1122 Type 2 diabetes mellitus with diabetic chronic kidney disease: Secondary | ICD-10-CM | POA: Diagnosis not present

## 2021-11-17 DIAGNOSIS — N1832 Chronic kidney disease, stage 3b: Secondary | ICD-10-CM

## 2021-11-17 DIAGNOSIS — Z79899 Other long term (current) drug therapy: Secondary | ICD-10-CM | POA: Diagnosis not present

## 2021-11-17 DIAGNOSIS — H548 Legal blindness, as defined in USA: Secondary | ICD-10-CM | POA: Insufficient documentation

## 2021-11-17 DIAGNOSIS — I255 Ischemic cardiomyopathy: Secondary | ICD-10-CM | POA: Insufficient documentation

## 2021-11-17 DIAGNOSIS — I482 Chronic atrial fibrillation, unspecified: Secondary | ICD-10-CM | POA: Insufficient documentation

## 2021-11-17 DIAGNOSIS — M2550 Pain in unspecified joint: Secondary | ICD-10-CM | POA: Diagnosis not present

## 2021-11-17 DIAGNOSIS — I2510 Atherosclerotic heart disease of native coronary artery without angina pectoris: Secondary | ICD-10-CM | POA: Insufficient documentation

## 2021-11-17 DIAGNOSIS — I5022 Chronic systolic (congestive) heart failure: Secondary | ICD-10-CM | POA: Diagnosis present

## 2021-11-17 DIAGNOSIS — Z951 Presence of aortocoronary bypass graft: Secondary | ICD-10-CM | POA: Diagnosis not present

## 2021-11-17 DIAGNOSIS — Z7901 Long term (current) use of anticoagulants: Secondary | ICD-10-CM | POA: Diagnosis not present

## 2021-11-17 MED ORDER — TORSEMIDE 20 MG PO TABS: 40.0000 mg | ORAL_TABLET | Freq: Every morning | ORAL | 3 refills | Status: DC

## 2021-11-17 NOTE — Addendum Note (Signed)
Encounter addended by: Stanford Scotland, RN on: 11/17/2021 9:54 AM ? Actions taken: Flowsheet accepted, Clinical Note Signed

## 2021-11-17 NOTE — Progress Notes (Signed)
? ?ID:  Elaine [name], DOB 1941/03/06, MRN [id number]  ?Provider location: [place] [facility] ?Type of Visit:  Heart Failure Follow up ? ?PCP:  Marton Redwood, MD  ?Cardiologist:  Glenetta Hew, MD ?Primary HF: Dr. Aundra Dubin ?EP: Dr Tomasa Blase ?  ?History of Present Illness: ?Elaine [name] is a 81 y.o. with history of CAD s/p CABG, COPD, CKD stage 3, and chronic systolic systolic CHF/ischemic cardiomyopathy.  She was referred by Dr. Ellyn Hack for evaluation of CHF.  Patient was admitted in 2/15 with atrial fibrillation/RVR and chest pain.  Cath showed severe 3VD and she had CABG x 3.  Echo in 2/15 showed EF down to 20-25%. In 2018, it appears that she went back into atrial fibrillation and has remained in atrial fibrillation persistently.  DCCV was not attempted.   Most recent echo in 10/20 showed EF < 20% with normal RV.  She had a St Jude CRT-D device implanted in 11/20.  She says that she feels "90%" better with CRT.  She is followed by Dr. Hollie Salk with nephrology.  ? ?She has been in atrial fibrillation persistently, I attempted DCCV while on amiodarone but she did not convert.  Therefore, I stopped amiodarone.   ? ?Echo in 3/21 showed EF 20% with diffuse hypokinesis, mildly dilated RV with mildly decreased systolic function, PASP 49 mmHg, dilated IVC.  In 9/21, she had AV nodal ablation.   ? ?She stopped empagliflozin due to UTIs and yeast infections.  ? ?Echo 12/22 EF 20-25%, mildly decreased RV function.  ? ?Over the last few weeks potassium stopped due to hyperkalemia. She also stopped torsemide last week.  ? ?Overall feeling fine. Denies SOB/PND/Orthopnea. No chest pain. Limited due to joint pain. Appetite ok. She has limited fluid to < 64 ounces.  No fever or chills. Weight at home 221 pounds.  Taking all medications. She is legally blind. Family provides transportation.  ?. ?Labs (2/21): K 4.5, creatinine 2.08 => 2.99, hgb 12.8 ?Labs (7/21): K 4, creatinine 2.3, TSH normal ?Labs (12/21): K 4,  creatinine 2.31, BNP 671 ?Labs (1/22): K 4.1, creatinine 2.48  ?Labs (3/22): LDL 77 ?Labs (6/22): K 4.9, creatinine 2.26 ?Labs (10/22): K 4.8, creatinine 2.41 ?Labs (12/22): LDL 62, hgb 13.5 ?Labs (2/23): K 4.4, creatinine 2.28 ?Labs 11/11/21: K 5.8  ?Labs (11/14/21): BUN 52 Creatinine 2.5 K 4.7  ? ?PMH: ?1. CAD: s/p CABG in 2/15 with LIMA-LAD, SVG-OM2, SVG-PLV.  ?2. LBBB: Chronic.  ?3. Atrial fibrillation: Persistent since 2018.  ?- LA appendage clip with CABG.  ?4. H/o aspiration PNA ?5. Type 2 diabetes ?6. COPD ?7. CKD stage 3 ?8. Chronic systolic CHF: Ischemic cardiomyopathy.  She has a [employer], political party BiV ICD.  ?- Echo (2/15): EF 20-25%.  ?- Echo (5/15): EF 40-45% ?- Echo (10/20): EF <20%, mild LVH, normal RV size and systolic function.  ?- Echo (3/21): EF 20% with diffuse hypokinesis, mildly dilated RV with mildly decreased systolic function, PASP 49 mmHg, dilated IVC.  ?- AV nodal ablation ?- Echo (12/22): EF 20-25%, mildly decreased RV function. ?9. CKD: Stage 3.  ? ?Social History  ? ?Socioeconomic History  ? Marital status: Widowed  ?  Spouse name: Not on file  ? Number of children: Not on file  ? Years of education: Not on file  ? Highest education level: Not on file  ?Occupational History  ? Occupation: retired  ?Tobacco Use  ? Smoking status: Former  ?  Packs/day: 0.50  ?  Years: 39.00  ?  Pack years: 19.50  ?  Types: Cigarettes  ?  Quit date: 08/28/1993  ?  Years since quitting: 28.2  ? Smokeless tobacco: Never  ?Vaping Use  ? Vaping Use: Never used  ?Substance and Sexual Activity  ? Alcohol use: No  ?  Comment: quit 95  ? Drug use: No  ? Sexual activity: Not on file  ?Other Topics Concern  ? Not on file  ?Social History Narrative  ? Lives with husband and son in [place].  She is currently retired.  ? Former smoker quit in 1995.  ? ?Social Determinants of Health  ? ?Financial Resource Strain: Not on file  ?Food Insecurity: Not on file  ?Transportation Needs: Not on file  ?Physical Activity: Not on file   ?Stress: Not on file  ?Social Connections: Not on file  ?Intimate Partner Violence: Not on file  ? ?Family History  ?Adopted: Yes  ?Problem Relation Age of Onset  ? Other Mother   ? Hypertension Mother   ? Colon cancer Neg Hx   ? Esophageal cancer Neg Hx   ? Rectal cancer Neg Hx   ? Stomach cancer Neg Hx   ? ?Current Outpatient Medications  ?Medication Sig Dispense Refill  ? acetaminophen (TYLENOL) 650 MG CR tablet Take 1,300 mg by mouth 2 (two) times daily.    ? albuterol (VENTOLIN HFA) 108 (90 Base) MCG/ACT inhaler Inhale 2 puffs into the lungs every 6 (six) hours as needed for wheezing or shortness of breath.    ? apixaban (ELIQUIS) 2.5 MG TABS tablet Take 2.5 mg by mouth 2 (two) times daily.    ? Coenzyme Q10 (COQ-10) 100 MG CAPS Take 100 mg by mouth in the morning and at bedtime.     ? colchicine 0.6 MG tablet Take 0.6 mg by mouth daily as needed (Gout).     ? Dulaglutide (TRULICITY [place]) Inject 1 Dose into the skin once a week.    ? ezetimibe (ZETIA) 10 MG tablet Take 10 mg by mouth daily.    ? ferrous sulfate 325 (65 FE) MG tablet Take 325 mg by mouth every other day.    ? isosorbide-hydrALAZINE (BIDIL) 20-37.5 MG tablet Take 0.5 tablets by mouth 3 (three) times daily.    ? levothyroxine (SYNTHROID) 112 MCG tablet Take 1 tablet (112 mcg total) by mouth daily before breakfast. 30 tablet 12  ? metolazone (ZAROXOLYN) 2.5 MG tablet TAKE ONLY AS DIRECTED BY CHF CLINIC 10 tablet 0  ? metoprolol succinate (TOPROL-XL) 100 MG 24 hr tablet Take 1 tablet (100 mg total) by mouth every morning AND 0.5 tablets (50 mg total) every evening. Take with or immediately following a meal.. 135 tablet 3  ? Multiple Vitamins-Minerals (PRESERVISION AREDS 2) CAPS Take 1 capsule by mouth 2 (two) times daily.    ? rosuvastatin (CRESTOR) 5 MG tablet Take 5 mg by mouth every other day.     ? umeclidinium-vilanterol (ANORO ELLIPTA) 62.5-25 MCG/INH AEPB Inhale 1 puff into the lungs daily.    ? ?No current facility-administered medications  for this encounter.  ? ?Wt Readings from Last 3 Encounters:  ?11/17/21 102.2 kg (225 lb 3.2 oz)  ?10/26/21 102.6 kg (226 lb 3.2 oz)  ?08/24/21 100.2 kg (221 lb)  ? ?BP 122/60   Pulse 73   Wt 102.2 kg (225 lb 3.2 oz)   SpO2 98%   BMI 41.19 kg/m?  ? ?General:  Arrived in wheel chair. Well appearing. No resp difficulty ?HEENT: normal ?Neck: supple. JVP 6-7 .  Carotids 2+ bilat; no bruits. No lymphadenopathy or thryomegaly appreciated. ?Cor: PMI nondisplaced. Irregular rate & rhythm. No rubs, gallops or murmurs. ?Lungs: clear ?Abdomen: soft, nontender, nondistended. No hepatosplenomegaly. No bruits or masses. Good bowel sounds. ?Extremities: no cyanosis, clubbing, rash, edema ?Neuro: alert & orientedx3, cranial nerves grossly intact. moves all 4 extremities w/o difficulty. Affect pleasant ? ?Assessment/Plan: ?1. Chronic systolic CHF: Ischemic cardiomyopathy.  St Jude CRT-D device, now s/p AV nodal ablation.  Echo in 3/21 showed EF 20% with diffuse hypokinesis, mildly dilated RV with mildly decreased systolic function, PASP 49 mmHg, dilated IVC.  Echo today showed EF 20-25%, mildly decreased RV function.   ?NYHA II. Volume status ok. I am going to restart torsemide 40 mg daily. Check BMET Monday. May need to add K but for now hold off until repeat blood work.    ?- She was unable to tolerate Bidil 1 tab tid, but she does fine with Bidil 1/2 tab tid so will continue.  ?- Continue Toprol XL 100 qam/50 qpm.   ?- Off empagliflozin due to yeast infection and UTI.  ?- Off spironolactone with side effects.  ?2. CAD: S/p CABG in 2015. No chest pain. .  ?- No ASA given apixaban use.  ?- Continue Crestor 5 daily + Zetia, good lipids 12/22. ?3. Atrial fibrillation: Chronic now, looks like it has gone on since 2018.  Unable to cardiovert her on amiodarone so it was stopped.  At this point Afib chronic. She is now s/p AVN ablation to allow more BiV pacing. Rate controlled.  ?- Continue apixaban.  ?4. CKD: Stage 4.  ?Creatinine  baseline 2.3-2.5  ?Check BMET next week.  ? ?Follow up with Dr Aundra Dubin in June.   ? ? ?Signed, ?Elaine Grinder, Elaine [name]  ?11/17/2021 ? ?[facility] ?[place] ?[address] ?[facility] ?[place]

## 2021-11-17 NOTE — Patient Instructions (Signed)
Thank you for coming in today ? ?No labs today ? ?START Torsemide 40 mg 2 tablets every morning  ? ?At the [facility], you and your health needs are our priority. As part of our continuing mission to provide you with exceptional heart care, we have created designated Provider Care Teams. These Care Teams include your primary Cardiologist (physician) and Advanced Practice Providers (APPs- Physician Assistants and Nurse Practitioners) who all work together to provide you with the care you need, when you need it.  ? ?You may see any of the following providers on your designated Care Team at your next follow up: ?Dr Glori Bickers ?Dr Loralie Champagne ?Darrick Grinder, NP ?Lyda Jester, PA ?Jessica Milford,NP ?Marlyce Huge, PA ?Audry Riles, PharmD ? ? ?Please be sure to bring in all your medications bottles to every appointment.  ? ?If you have any questions or concerns before your next appointment please send [place] a message through [place] or call our office at [phone].   ? ?TO LEAVE A MESSAGE FOR THE NURSE SELECT OPTION 2, PLEASE LEAVE A MESSAGE INCLUDING: ?YOUR NAME ?DATE OF BIRTH ?CALL BACK NUMBER ?REASON FOR CALL**this is important as we prioritize the call backs ? ?YOU WILL RECEIVE A CALL BACK THE SAME DAY AS LONG AS YOU CALL BEFORE 4:00 PM ? ?

## 2021-11-17 NOTE — Progress Notes (Signed)
ReDS Vest / Clip - 11/17/21 0900   ? ?  ? ReDS Vest / Clip  ? Station Marker B   ? Ruler Value 31   ? ReDS Value Range Low volume   ? ReDS Actual Value 35   ? ?  ?  ? ?  ? ? ?

## 2021-11-22 ENCOUNTER — Other Ambulatory Visit: Payer: Self-pay

## 2021-11-22 ENCOUNTER — Ambulatory Visit (HOSPITAL_COMMUNITY)
Admission: RE | Admit: 2021-11-22 | Discharge: 2021-11-22 | Disposition: A | Discharge status: Home / Self Care | Payer: Medicare Other | Source: Ambulatory Visit | Attending: Internal Medicine | Admitting: Internal Medicine

## 2021-11-22 DIAGNOSIS — I5022 Chronic systolic (congestive) heart failure: Secondary | ICD-10-CM | POA: Diagnosis present

## 2021-11-22 LAB — BASIC METABOLIC PANEL
Anion gap: 11 (ref 5–15)
BUN: 46 mg/dL — ABNORMAL HIGH (ref 8–23)
CO2: 25 mmol/L (ref 22–32)
Calcium: 9.7 mg/dL (ref 8.9–10.3)
Chloride: 100 mmol/L (ref 98–111)
Creatinine, Ser: 2.16 mg/dL — ABNORMAL HIGH (ref 0.44–1.00)
GFR, Estimated: 22 mL/min — ABNORMAL LOW (ref >60)
Glucose, Bld: 103 mg/dL — ABNORMAL HIGH (ref 70–99)
Potassium: 3.7 mmol/L (ref 3.5–5.1)
Sodium: 136 mmol/L (ref 135–145)

## 2021-11-28 ENCOUNTER — Ambulatory Visit (INDEPENDENT_AMBULATORY_CARE_PROVIDER_SITE_OTHER): Payer: Medicare Other

## 2021-11-28 ENCOUNTER — Telehealth (HOSPITAL_COMMUNITY): Payer: Self-pay | Admitting: *Deleted

## 2021-11-28 DIAGNOSIS — Z95810 Presence of automatic (implantable) cardiac defibrillator: Secondary | ICD-10-CM | POA: Diagnosis not present

## 2021-11-28 DIAGNOSIS — I5022 Chronic systolic (congestive) heart failure: Secondary | ICD-10-CM

## 2021-11-28 NOTE — Telephone Encounter (Signed)
Pt called c/o swelling in feet and ankles. Pt said her weight is up 12lbs since 3/26. Pt requested an office visit. Appt scheduled for tomorrow 11/29/21 at 10:00am.  ?

## 2021-11-28 NOTE — Progress Notes (Signed)
EPIC Encounter for ICM Monitoring ? ?Patient Name: Elaine [name] is a 81 y.o. female ?Date: 11/28/2021 ?Primary Care Physican: Ginger Organ., MD ?Primary Cardiologist: Harding/McLean ?Electrophysiologist: [place] ?Bi-V Pacing: 97% ?08/24/2021 Office Weight: 221 lbs ?09/27/2021 Weight: 225 lbs ?                                                         ?  ?Spoke with patient and heart failure questions reviewed.  Pt has some swelling of feet, legs and ankles as well as weight gain of 12.7 lbs since 3/26 which correlates with decrease in Torsemide dosage.   Cant walk more than 3 feet without getting SOB.  ?  ?CorVue thoracic impedance suggesting possible fluid accumulation starting 3/13 with exception of 2 days at baseline and currently trending back close to baseline. ?  ?Prescribed:   ?Torsemide 20 mg take 2 tablets (40 mg total) by mouth daily (decreased from 80 mg bid on 3/23).   Torsemide was decreased 3/23 from 80 mg twice a day to 40 mg daily.   ?Metolazone 2.5 mg Take only as directed by CHF clinic. ?  ?Labs: ?11/22/2021 Creatinine 2.16, BUN 46, Potassium 3.7, Sodium 136, GFR 22 ?11/14/2021 Creatinine 2.50, BUN 52, Potassium 4.7, Sodium 138, GFR 19  ?11/11/2021 Creatinine 2.49, BUN 53, Potassium 5.8, Sodium 141, GFR 19  ?11/04/2021 Creatinine 2.32, BUN 58, Potassium 3.2, Sodium 136, GFR 21  ?10/26/2021 Creatinine 2.66, BUN 81, Potassium 3.2, Sodium 136, GFR 17 ?09/21/2021 Creatinine 2.28, BUN 37, Potassium 4.4, Sodium 136, GFR 21 ?09/09/2021 Creatinine 2.74, BUN 75, Potassium 4.2, Sodium 136, GFR 17 ?A complete set of results can be found in Results Review. ?  ?Recommendations:   She called HF clinic 4/3 to report symptoms and has an appointment 4/4 for recommendations.    ?  ?Follow-up plan: ICM clinic phone appointment on 12/05/2021 to recheck fluid levels.   91 day device clinic remote transmission 12/07/2021.   ?  ?EP/Cardiology Office Visits:   01/27/2022 with Dr Aundra Dubin.   Recall 07/02/2022 with Dr Curt Bears. ?   ?Copy of ICM check sent to Dr. Curt Bears.   ? ?3 month ICM trend: 11/28/2021. ? ? ? ?12-14 Month ICM trend:  ? ? ? ?[name], RN ?11/28/2021 ?8:09 AM ? ?

## 2021-11-29 ENCOUNTER — Ambulatory Visit (HOSPITAL_COMMUNITY)
Admission: RE | Admit: 2021-11-29 | Discharge: 2021-11-29 | Disposition: A | Discharge status: Home / Self Care | Payer: Medicare Other | Source: Ambulatory Visit | Attending: Family Medicine | Admitting: Family Medicine

## 2021-11-29 ENCOUNTER — Encounter (HOSPITAL_COMMUNITY): Payer: Self-pay

## 2021-11-29 VITALS — BP 106/64 | HR 73 | Wt 233.0 lb

## 2021-11-29 DIAGNOSIS — Z7901 Long term (current) use of anticoagulants: Secondary | ICD-10-CM | POA: Insufficient documentation

## 2021-11-29 DIAGNOSIS — H548 Legal blindness, as defined in USA: Secondary | ICD-10-CM | POA: Insufficient documentation

## 2021-11-29 DIAGNOSIS — Z7985 Long-term (current) use of injectable non-insulin antidiabetic drugs: Secondary | ICD-10-CM | POA: Insufficient documentation

## 2021-11-29 DIAGNOSIS — N184 Chronic kidney disease, stage 4 (severe): Secondary | ICD-10-CM | POA: Diagnosis not present

## 2021-11-29 DIAGNOSIS — E1122 Type 2 diabetes mellitus with diabetic chronic kidney disease: Secondary | ICD-10-CM | POA: Insufficient documentation

## 2021-11-29 DIAGNOSIS — I482 Chronic atrial fibrillation, unspecified: Secondary | ICD-10-CM | POA: Diagnosis not present

## 2021-11-29 DIAGNOSIS — Z8249 Family history of ischemic heart disease and other diseases of the circulatory system: Secondary | ICD-10-CM | POA: Diagnosis not present

## 2021-11-29 DIAGNOSIS — I255 Ischemic cardiomyopathy: Secondary | ICD-10-CM | POA: Diagnosis not present

## 2021-11-29 DIAGNOSIS — I5022 Chronic systolic (congestive) heart failure: Secondary | ICD-10-CM | POA: Diagnosis not present

## 2021-11-29 DIAGNOSIS — J449 Chronic obstructive pulmonary disease, unspecified: Secondary | ICD-10-CM | POA: Insufficient documentation

## 2021-11-29 DIAGNOSIS — I2510 Atherosclerotic heart disease of native coronary artery without angina pectoris: Secondary | ICD-10-CM | POA: Diagnosis not present

## 2021-11-29 DIAGNOSIS — I4821 Permanent atrial fibrillation: Secondary | ICD-10-CM | POA: Diagnosis not present

## 2021-11-29 DIAGNOSIS — Z951 Presence of aortocoronary bypass graft: Secondary | ICD-10-CM | POA: Diagnosis not present

## 2021-11-29 DIAGNOSIS — Z87891 Personal history of nicotine dependence: Secondary | ICD-10-CM | POA: Insufficient documentation

## 2021-11-29 DIAGNOSIS — Z95810 Presence of automatic (implantable) cardiac defibrillator: Secondary | ICD-10-CM | POA: Insufficient documentation

## 2021-11-29 DIAGNOSIS — I5023 Acute on chronic systolic (congestive) heart failure: Secondary | ICD-10-CM | POA: Diagnosis present

## 2021-11-29 LAB — BASIC METABOLIC PANEL
Anion gap: 9 (ref 5–15)
BUN: 47 mg/dL — ABNORMAL HIGH (ref 8–23)
CO2: 27 mmol/L (ref 22–32)
Calcium: 9.7 mg/dL (ref 8.9–10.3)
Chloride: 102 mmol/L (ref 98–111)
Creatinine, Ser: 2.18 mg/dL — ABNORMAL HIGH (ref 0.44–1.00)
GFR, Estimated: 22 mL/min — ABNORMAL LOW (ref >60)
Glucose, Bld: 148 mg/dL — ABNORMAL HIGH (ref 70–99)
Potassium: 4.1 mmol/L (ref 3.5–5.1)
Sodium: 138 mmol/L (ref 135–145)

## 2021-11-29 LAB — BRAIN NATRIURETIC PEPTIDE: B Natriuretic Peptide: 396.3 pg/mL — ABNORMAL HIGH (ref 0.0–100.0)

## 2021-11-29 MED ORDER — TORSEMIDE 20 MG PO TABS: 60.0000 mg | ORAL_TABLET | Freq: Every morning | ORAL | 2 refills | Status: DC

## 2021-11-29 MED ORDER — POTASSIUM CHLORIDE CRYS ER 20 MEQ PO TBCR: 20.0000 meq | EXTENDED_RELEASE_TABLET | Freq: Every day | ORAL | 2 refills | Status: DC

## 2021-11-29 NOTE — Progress Notes (Signed)
? ?ID:  Elaine [name], DOB 05-31-1941, MRN [id number]  ?Provider location: [facility] ?Type of Visit:  Heart Failure Follow up ? ?PCP:  Marton Redwood, MD  ?Cardiologist:  Glenetta Hew, MD ?Primary HF: Dr. Aundra Dubin ?EP: Dr Tomasa Blase ?  ?History of Present Illness: ?Elaine [name] is a 81 y.o. with history of CAD s/p CABG, COPD, CKD stage 3, and chronic systolic systolic CHF/ischemic cardiomyopathy.  She was referred by Dr. Ellyn Hack for evaluation of CHF.  Patient was admitted in 2/15 with atrial fibrillation/RVR and chest pain.  Cath showed severe 3VD and she had CABG x 3.  Echo in 2/15 showed EF down to 20-25%. In 2018, it appears that she went back into atrial fibrillation and has remained in atrial fibrillation persistently.  DCCV was not attempted.   Most recent echo in 10/20 showed EF < 20% with normal RV.  She had a St Jude CRT-D device implanted in 11/20.  She says that she feels "90%" better with CRT.  She is followed by Dr. Hollie Salk with nephrology.  ? ?She has been in atrial fibrillation persistently, I attempted DCCV while on amiodarone but she did not convert.  Therefore, I stopped amiodarone.   ? ?Echo in 3/21 showed EF 20% with diffuse hypokinesis, mildly dilated RV with mildly decreased systolic function, PASP 49 mmHg, dilated IVC.  In 9/21, she had AV nodal ablation.   ? ?She stopped empagliflozin due to UTIs and yeast infections.  ? ?Echo 12/22 EF 20-25%, mildly decreased RV function.  ? ?Over the last few weeks, potassium stopped due to hyperkalemia. She stopped her torsemide 3 weeks ago. Follow up 11/17/21, torsemide 40 restarted. ? ?Today she returns for an acute visit for increased swelling. She is more fatigued and SOB. Has noticed more swelling over the past week. She is watching her salt and fluid intake closely. Denies abnormal bleeding, palpitations, CP, dizziness, or PND/Orthopnea. Appetite ok. No fever or chills. Weight at home 233 pounds. Taking all medications. She is  legally blind and family provides transportation. ? ?ReDs: 33% ? ?Device interrogation (personally reviewed): CorVue down, 97% bi-v pacing,  ? ?Labs (2/21): K 4.5, creatinine 2.08 => 2.99, hgb 12.8 ?Labs (7/21): K 4, creatinine 2.3, TSH normal ?Labs (12/21): K 4, creatinine 2.31, BNP 671 ?Labs (1/22): K 4.1, creatinine 2.48  ?Labs (3/22): LDL 77 ?Labs (6/22): K 4.9, creatinine 2.26 ?Labs (10/22): K 4.8, creatinine 2.41 ?Labs (12/22): LDL 62, hgb 13.5 ?Labs (2/23): K 4.4, creatinine 2.28 ?Labs (11/11/21): K 5.8  ?Labs (3/23): BUN 52 Creatinine 2.5 K 4.7  ? ?PMH: ?1. CAD: s/p CABG in 2/15 with LIMA-LAD, SVG-OM2, SVG-PLV.  ?2. LBBB: Chronic.  ?3. Atrial fibrillation: Persistent since 2018.  ?- LA appendage clip with CABG.  ?4. H/o aspiration PNA ?5. Type 2 diabetes ?6. COPD ?7. CKD stage 3 ?8. Chronic systolic CHF: Ischemic cardiomyopathy.  She has a [employer], political party BiV ICD.  ?- Echo (2/15): EF 20-25%.  ?- Echo (5/15): EF 40-45% ?- Echo (10/20): EF <20%, mild LVH, normal RV size and systolic function.  ?- Echo (3/21): EF 20% with diffuse hypokinesis, mildly dilated RV with mildly decreased systolic function, PASP 49 mmHg, dilated IVC.  ?- AV nodal ablation ?- Echo (12/22): EF 20-25%, mildly decreased RV function. ?9. CKD: Stage 3.  ? ?Social History  ? ?Socioeconomic History  ? Marital status: Widowed  ?  Spouse name: Not on file  ? Number of children: Not on file  ? Years  of education: Not on file  ? Highest education level: Not on file  ?Occupational History  ? Occupation: retired  ?Tobacco Use  ? Smoking status: Former  ?  Packs/day: 0.50  ?  Years: 39.00  ?  Pack years: 19.50  ?  Types: Cigarettes  ?  Quit date: 08/28/1993  ?  Years since quitting: 28.2  ? Smokeless tobacco: Never  ?Vaping Use  ? Vaping Use: Never used  ?Substance and Sexual Activity  ? Alcohol use: No  ?  Comment: quit 95  ? Drug use: No  ? Sexual activity: Not on file  ?Other Topics Concern  ? Not on file  ?Social History Narrative  ? Lives with husband and  son in [place].  She is currently retired.  ? Former smoker quit in 1995.  ? ?Social Determinants of Health  ? ?Financial Resource Strain: Not on file  ?Food Insecurity: Not on file  ?Transportation Needs: Not on file  ?Physical Activity: Not on file  ?Stress: Not on file  ?Social Connections: Not on file  ?Intimate Partner Violence: Not on file  ? ?Family History  ?Adopted: Yes  ?Problem Relation Age of Onset  ? Other Mother   ? Hypertension Mother   ? Colon cancer Neg Hx   ? Esophageal cancer Neg Hx   ? Rectal cancer Neg Hx   ? Stomach cancer Neg Hx   ? ?Current Outpatient Medications  ?Medication Sig Dispense Refill  ? acetaminophen (TYLENOL) 650 MG CR tablet Take 1,300 mg by mouth 2 (two) times daily.    ? albuterol (VENTOLIN HFA) 108 (90 Base) MCG/ACT inhaler Inhale 2 puffs into the lungs every 6 (six) hours as needed for wheezing or shortness of breath.    ? apixaban (ELIQUIS) 2.5 MG TABS tablet Take 2.5 mg by mouth 2 (two) times daily.    ? Coenzyme Q10 (COQ-10) 100 MG CAPS Take 100 mg by mouth in the morning and at bedtime.     ? colchicine 0.6 MG tablet Take 0.6 mg by mouth daily as needed (Gout).     ? Dulaglutide (TRULICITY [place]) Inject 1 Dose into the skin once a week.    ? ezetimibe (ZETIA) 10 MG tablet Take 10 mg by mouth daily.    ? ferrous sulfate 325 (65 FE) MG tablet Take 325 mg by mouth every other day.    ? isosorbide-hydrALAZINE (BIDIL) 20-37.5 MG tablet Take 0.5 tablets by mouth 3 (three) times daily.    ? levothyroxine (SYNTHROID) 112 MCG tablet Take 1 tablet (112 mcg total) by mouth daily before breakfast. 30 tablet 12  ? metolazone (ZAROXOLYN) 2.5 MG tablet TAKE ONLY AS DIRECTED BY CHF CLINIC 10 tablet 0  ? metoprolol succinate (TOPROL-XL) 100 MG 24 hr tablet Take 1 tablet (100 mg total) by mouth every morning AND 0.5 tablets (50 mg total) every evening. Take with or immediately following a meal.. 135 tablet 3  ? Multiple Vitamins-Minerals (PRESERVISION AREDS 2) CAPS Take 1 capsule by mouth  2 (two) times daily.    ? rosuvastatin (CRESTOR) 5 MG tablet Take 5 mg by mouth every other day.     ? torsemide (DEMADEX) 20 MG tablet Take 2 tablets (40 mg total) by mouth in the morning. 60 tablet 3  ? umeclidinium-vilanterol (ANORO ELLIPTA) 62.5-25 MCG/INH AEPB Inhale 1 puff into the lungs daily.    ? ?No current facility-administered medications for this encounter.  ? ?Wt Readings from Last 3 Encounters:  ?11/29/21 105.7 kg (233  lb)  ?11/17/21 102.2 kg (225 lb 3.2 oz)  ?10/26/21 102.6 kg (226 lb 3.2 oz)  ? ?BP 106/64   Pulse 73   Wt 105.7 kg (233 lb)   SpO2 98%   BMI 42.62 kg/m?  ?Physical Exam: ?General:  NAD. No resp difficulty, arrived in [facility] ?HEENT: Legally blind. ?Neck: Supple. JVP 6-7 Carotids 2+ bilat; no bruits. No lymphadenopathy or thryomegaly appreciated. ?Cor: PMI nondisplaced. Regular rate & rhythm. No rubs, gallops or murmurs. ?Lungs: Clear ?Abdomen: Obese, nontender, nondistended. No hepatosplenomegaly. No bruits or masses. Good bowel sounds. ?Extremities: No cyanosis, clubbing, rash, 2+ BLE edema to knees ?Neuro: Alert & oriented x 3, cranial nerves grossly intact. Moves all 4 extremities w/o difficulty. Affect pleasant. ? ?Assessment/Plan: ?1. Acute on chronic systolic CHF: Ischemic cardiomyopathy.  St Jude CRT-D device, now s/p AV nodal ablation.  Echo in 3/21 showed EF 20% with diffuse hypokinesis, mildly dilated RV with mildly decreased systolic function, PASP 49 mmHg, dilated IVC.  Echo 12/22 showed EF 20-25%, mildly decreased RV function.   ?NYHA III-IIIb, limited by joint pain. She is volume overloaded today on exam and by CorVue, weight up 8 lbs, REDs 33%.  ?- Increase torsemide to 40 mg bid x 3 days, then start torsemide 60 mg daily (previously on 40 mg daily). BMET/BNP today, repeat BMET in 7-10 days. I will ask device RN to send CoreVue in 1 week to follow fluid. ?- Start 20 mEq KCL daily. ?- She was unable to tolerate Bidil 1 tab tid, but she does fine with Bidil 1/2 tab tid so  will continue.  ?- Continue Toprol XL 100 qam/50 qpm.   ?- Off empagliflozin due to yeast infection and UTI.  ?- Off spironolactone with side effects.  ?2. CAD: S/p CABG in 2015. No chest pain.   ?- No ASA gi

## 2021-11-29 NOTE — Progress Notes (Signed)
ReDS Vest / Clip - 11/29/21 1000   ? ?  ? ReDS Vest / Clip  ? Station Marker B   ? Ruler Value 31   ? ReDS Value Range Low volume   ? ReDS Actual Value 33   ? ?  ?  ? ?  ? ? ?

## 2021-11-29 NOTE — Patient Instructions (Signed)
Labs done today. We will contact you only if your labs are abnormal. ? ?START Potassium 81mq (1 tablet) by mouth daily.  ? ?INCREASE Torsemide to '40mg'$  (2 tablets) by mouth 2 times daily for 3 days THEN INCREASE to '60mg'$  (3 tablets) by mouth daily.  ? ?No other medication changes were made. Please continue all current medications as prescribed. ? ?Your physician recommends that you schedule a follow-up appointment in: 7-10 days for a lab only appointment and keep your pending appointment with Dr. MAundra Dubin ? ?If you have any questions or concerns before your next appointment please send [place] message through [place] call our office at [phone]   ? ?TO LEAVE A MESSAGE FOR THE NURSE SELECT OPTION 2, PLEASE LEAVE A MESSAGE INCLUDING: ?YOUR NAME ?DATE OF BIRTH ?CALL BACK NUMBER ?REASON FOR CALL**this is important as we prioritize the call backs ? ?YOU WILL RECEIVE A CALL BACK THE SAME DAY AS LONG AS YOU CALL BEFORE 4:00 PM ? ? ?Do the following things EVERYDAY: ?Weigh yourself in the morning before breakfast. Write it down and keep it in a log. ?Take your medicines as prescribed ?Eat low salt foods--Limit salt (sodium) to 2000 mg per day.  ?Stay as active as you can everyday ?Limit all fluids for the day to less than 2 liters ? ? ?[facility] you and your health needs are our priority. As part of our continuing mission to provide you with exceptional heart care, we have created designated Provider Care Teams. These Care Teams include your primary Cardiologist (physician) and Advanced Practice Providers (APPs- Physician Assistants and Nurse Practitioners) who all work together to provide you with the care you need, when you need it.  ? ?You may see any of the following providers on your designated Care Team at your next follow up: ?Dr DGlori Bickers?Dr DLoralie Champagne?ADarrick Grinder NP ?BLyda Jester PA ?LAudry Riles PharmD ? ? ?Please be sure to bring in all your medications bottles to  every appointment.  ? ?

## 2021-12-05 ENCOUNTER — Telehealth: Payer: Self-pay

## 2021-12-05 ENCOUNTER — Ambulatory Visit (INDEPENDENT_AMBULATORY_CARE_PROVIDER_SITE_OTHER): Payer: Medicare Other

## 2021-12-05 DIAGNOSIS — I5022 Chronic systolic (congestive) heart failure: Secondary | ICD-10-CM

## 2021-12-05 DIAGNOSIS — Z95810 Presence of automatic (implantable) cardiac defibrillator: Secondary | ICD-10-CM

## 2021-12-05 NOTE — Progress Notes (Signed)
EPIC Encounter for ICM Monitoring ? ?Patient Name: Elaine [name] is a 81 y.o. female ?Date: 12/05/2021 ?Primary Care Physican: Ginger Organ., MD ?Primary Cardiologist: Harding/McLean ?Electrophysiologist: Camnitz ?Bi-V Pacing: 97% ?09/27/2021 Weight: 225 lbs ?12/05/2021 Weight: 235.3 lbs. ?                                                         ?  ?Spoke with patient and heart failure questions reviewed.  Pt's weight remains 233-235 lbs at home even with fluid levels returning to baseline normal after taking extra Torsemide as directed by HF clinic on 4/4. ?  ?CorVue thoracic impedance suggesting fluid levels returned to baseline normal after taking extra Torsemide as directed at  HF clinic. ?  ?Prescribed:   ?Torsemide 20 mg take 6 tablets (60 mg total) by mouth daily.   ?Metolazone 2.5 mg Take only as directed by CHF clinic. ?  ?Labs: ?11/29/2021 Creatinine 2.18, BUN 47, Potassium 4.1, Sodium 138, GFR 22 ?11/22/2021 Creatinine 2.16, BUN 46, Potassium 3.7, Sodium 136, GFR 22 ?11/14/2021 Creatinine 2.50, BUN 52, Potassium 4.7, Sodium 138, GFR 19  ?11/11/2021 Creatinine 2.49, BUN 53, Potassium 5.8, Sodium 141, GFR 19  ?11/04/2021 Creatinine 2.32, BUN 58, Potassium 3.2, Sodium 136, GFR 21  ?10/26/2021 Creatinine 2.66, BUN 81, Potassium 3.2, Sodium 136, GFR 17 ?09/21/2021 Creatinine 2.28, BUN 37, Potassium 4.4, Sodium 136, GFR 21 ?09/09/2021 Creatinine 2.74, BUN 75, Potassium 4.2, Sodium 136, GFR 17 ?A complete set of results can be found in Results Review. ?  ?Recommendations:   No changes and encouraged to call if experiencing any fluid symptoms.   ?  ?Follow-up plan: ICM clinic phone appointment on 12/19/2021 to recheck fluid levels.   91 day device clinic remote transmission 12/07/2021.   ?  ?EP/Cardiology Office Visits:   01/27/2022 with Dr Aundra Dubin.   Recall 07/02/2022 with Dr Curt Bears. ?  ?Copy of ICM check sent to Dr. Curt Bears.   ? ?3 month ICM trend: 12/05/2021. ? ? ? ?12-14 Month ICM trend:  ? ? ? ?[name],  RN ?12/05/2021 ?11:54 AM ? ?

## 2021-12-05 NOTE — Telephone Encounter (Signed)
Spoke with patient and requested to send remote transmission to recheck fluid levels.  Pt sent report during conversation.  See ICM Note for results.   ? ?

## 2021-12-06 ENCOUNTER — Ambulatory Visit (HOSPITAL_COMMUNITY)
Admission: RE | Admit: 2021-12-06 | Discharge: 2021-12-06 | Disposition: A | Discharge status: Home / Self Care | Payer: Medicare Other | Source: Ambulatory Visit | Attending: Internal Medicine | Admitting: Internal Medicine

## 2021-12-06 DIAGNOSIS — I5022 Chronic systolic (congestive) heart failure: Secondary | ICD-10-CM | POA: Insufficient documentation

## 2021-12-06 LAB — BASIC METABOLIC PANEL
Anion gap: 11 (ref 5–15)
BUN: 53 mg/dL — ABNORMAL HIGH (ref 8–23)
CO2: 32 mmol/L (ref 22–32)
Calcium: 9.7 mg/dL (ref 8.9–10.3)
Chloride: 94 mmol/L — ABNORMAL LOW (ref 98–111)
Creatinine, Ser: 2.31 mg/dL — ABNORMAL HIGH (ref 0.44–1.00)
GFR, Estimated: 21 mL/min — ABNORMAL LOW (ref >60)
Glucose, Bld: 135 mg/dL — ABNORMAL HIGH (ref 70–99)
Potassium: 3.9 mmol/L (ref 3.5–5.1)
Sodium: 137 mmol/L (ref 135–145)

## 2021-12-07 ENCOUNTER — Ambulatory Visit (INDEPENDENT_AMBULATORY_CARE_PROVIDER_SITE_OTHER): Payer: Medicare Other

## 2021-12-07 DIAGNOSIS — I255 Ischemic cardiomyopathy: Secondary | ICD-10-CM

## 2021-12-07 LAB — CUP PACEART REMOTE DEVICE CHECK
Battery Remaining Longevity: 67 mo
Battery Remaining Percentage: 68 %
Battery Voltage: 2.98 V
Date Time Interrogation Session: 20230412020238
HighPow Impedance: 61 Ohm
Implantable Lead Implant Date: 20201130
Implantable Lead Implant Date: 20201130
Implantable Lead Location: 753858
Implantable Lead Location: 753860
Implantable Pulse Generator Implant Date: 20201130
Lead Channel Impedance Value: 490 Ohm
Lead Channel Impedance Value: 540 Ohm
Lead Channel Pacing Threshold Amplitude: 0.75 V
Lead Channel Pacing Threshold Amplitude: 0.875 V
Lead Channel Pacing Threshold Pulse Width: 0.4 ms
Lead Channel Pacing Threshold Pulse Width: 0.4 ms
Lead Channel Sensing Intrinsic Amplitude: 12 mV
Lead Channel Setting Pacing Amplitude: 1.375
Lead Channel Setting Pacing Amplitude: 2.5 V
Lead Channel Setting Pacing Pulse Width: 0.4 ms
Lead Channel Setting Pacing Pulse Width: 0.4 ms
Lead Channel Setting Sensing Sensitivity: 0.5 mV
Pulse Gen Serial Number: 810000193

## 2021-12-09 ENCOUNTER — Telehealth (HOSPITAL_COMMUNITY): Payer: Self-pay | Admitting: *Deleted

## 2021-12-09 MED ORDER — METOLAZONE 2.5 MG PO TABS: 2.5000 mg | ORAL_TABLET | ORAL | 2 refills | Status: DC

## 2021-12-09 NOTE — Telephone Encounter (Signed)
Pt aware, agreeable, and verbalized understanding ? Prescription sent to pharmacy  ? ?Conrad Van Wyck, NP  You; Harvie Junior, CMA; Rose Fillers, Marsh Dolly, RN 1 minute ago (4:22 PM)  ? ?Please call and ask to take 2.5 mg x 2 days  ? ?Amy Clegg NP-C    ? ?

## 2021-12-09 NOTE — Telephone Encounter (Signed)
Janett Billow [place],FNP is out of the office routed to [place] for advice  ?

## 2021-12-09 NOTE — Telephone Encounter (Signed)
Pt called stating her legs are swollen and weight is up. Pt was 217lbs on 3/26 pts weight is now 235lbs. Pt said this happened in February and she took 2.'5mg'$  of metolazone for 3 days and was much better. Pt asked if she can try metolazone for a few days again. Pt is currently taking Torsemide '60mg'$  daily. ? ?Routed to [facility] for advice ?

## 2021-12-13 ENCOUNTER — Other Ambulatory Visit: Payer: Self-pay | Admitting: Cardiology

## 2021-12-15 ENCOUNTER — Telehealth (HOSPITAL_COMMUNITY): Payer: Self-pay | Admitting: Pharmacy Technician

## 2021-12-15 ENCOUNTER — Other Ambulatory Visit (HOSPITAL_COMMUNITY): Payer: Self-pay

## 2021-12-15 MED ORDER — ISOSORB DINITRATE-HYDRALAZINE 20-37.5 MG PO TABS: 0.5000 | ORAL_TABLET | Freq: Three times a day (TID) | ORAL | 3 refills | Status: DC

## 2021-12-15 NOTE — Telephone Encounter (Signed)
Advanced Heart Failure Patient Advocate Encounter ? ?Patient called and left a message stating that the pharmacy needs [place] to talk with insurance regarding her Bidil. Upon further investigation, a test claim shows 90 days would be $0. Called and spoke with the patient.  ? ?She needed a refill sent to the pharmacy, no PA required. Sent 90 day RX request to [employer], corporate) to send to [employer].  ? ?Charlann Boxer, CPhT ? ?

## 2021-12-19 ENCOUNTER — Ambulatory Visit: Payer: Medicare Other

## 2021-12-19 DIAGNOSIS — Z95810 Presence of automatic (implantable) cardiac defibrillator: Secondary | ICD-10-CM

## 2021-12-19 DIAGNOSIS — I5022 Chronic systolic (congestive) heart failure: Secondary | ICD-10-CM

## 2021-12-20 NOTE — Progress Notes (Signed)
EPIC Encounter for ICM Monitoring ? ?Patient Name: LORAIN FETTES is a 81 y.o. female ?Date: 12/20/2021 ?Primary Care Physican: Ginger Organ., MD ?Primary Cardiologist: Harding/McLean ?Electrophysiologist: Camnitz ?Bi-V Pacing: 97% ?12/05/2021 Weight: 235.3 lbs. ?12/20/2021 Weight: 219 lbs ?                                                         ?  ?Spoke with patient and heart failure questions reviewed.  She is doing well except for fall this past week.  ?  ?CorVue thoracic impedance suggesting fluid levels returned to normal.  Impedance was suggesting dryness from 4/10-4/21 in response to taking Metolazone 2.5 mg x 2 days as instructed as HF clinic.   ?  ?Prescribed:   ?Torsemide 20 mg take 6 tablets (60 mg total) by mouth daily.   ?Metolazone 2.5 mg Take only as directed by CHF clinic. ?  ?Labs: ?12/06/2021 Creatinine 2.31, BUN 53, Potassium 3.9, Sodium 137, GFR 21 ?11/29/2021 Creatinine 2.18, BUN 47, Potassium 4.1, Sodium 138, GFR 22 ?11/22/2021 Creatinine 2.16, BUN 46, Potassium 3.7, Sodium 136, GFR 22 ?11/14/2021 Creatinine 2.50, BUN 52, Potassium 4.7, Sodium 138, GFR 19  ?11/11/2021 Creatinine 2.49, BUN 53, Potassium 5.8, Sodium 141, GFR 19  ?11/04/2021 Creatinine 2.32, BUN 58, Potassium 3.2, Sodium 136, GFR 21  ?10/26/2021 Creatinine 2.66, BUN 81, Potassium 3.2, Sodium 136, GFR 17 ?09/21/2021 Creatinine 2.28, BUN 37, Potassium 4.4, Sodium 136, GFR 21 ?09/09/2021 Creatinine 2.74, BUN 75, Potassium 4.2, Sodium 136, GFR 17 ?A complete set of results can be found in Results Review. ?  ?Recommendations:   No changes and encouraged to call if experiencing any fluid symptoms.   ?  ?Follow-up plan: ICM clinic phone appointment on 01/02/2022.   91 day device clinic remote transmission 03/08/2022.   ?  ?EP/Cardiology Office Visits:   01/27/2022 with Dr Aundra Dubin.   Recall 07/02/2022 with Dr Curt Bears. ?  ?Copy of ICM check sent to Dr. Curt Bears.   ? ?3 month ICM trend: 12/19/2021. ? ? ? ?12-14 Month ICM trend:  ? ? ? ?[name], RN ?12/20/2021 ?2:50 PM ? ?

## 2021-12-23 NOTE — Progress Notes (Signed)
Remote ICD transmission.   

## 2022-01-02 ENCOUNTER — Ambulatory Visit (INDEPENDENT_AMBULATORY_CARE_PROVIDER_SITE_OTHER): Payer: Medicare Other

## 2022-01-02 DIAGNOSIS — Z95810 Presence of automatic (implantable) cardiac defibrillator: Secondary | ICD-10-CM

## 2022-01-02 DIAGNOSIS — I5022 Chronic systolic (congestive) heart failure: Secondary | ICD-10-CM | POA: Diagnosis not present

## 2022-01-02 NOTE — Progress Notes (Signed)
EPIC Encounter for ICM Monitoring ? ?Patient Name: Elaine [name] is a 81 y.o. female ?Date: 01/02/2022 ?Primary Care Physican: Ginger Organ., MD ?Primary Cardiologist: Harding/McLean ?Electrophysiologist: Camnitz ?Bi-V Pacing: 97% ?09/27/2021 Weight: 225 lbs ?12/20/2021 Weight: 219 lbs ?01/03/2022 Weight: 225 lbs ?                                                         ?  ?Spoke with patient and heart failure questions reviewed.  Pt reports 6 lb weight gain since 4/25.  She is not strict on salt intake. ?  ?CorVue thoracic impedance suggesting possible fluid accumulation starting 4/26 and trending toward baseline. ?  ?Prescribed:   ?Torsemide 20 mg take 3 tablets (60 mg total) by mouth daily.   ?Potassium 20 mEq take 1 tablet by mouth daily. ?Metolazone 2.5 mg Take only as directed by CHF clinic. ?  ?Labs: ?12/06/2021 Creatinine 2.31, BUN 53, Potassium 3.9, Sodium 137, GFR 21 ?11/29/2021 Creatinine 2.18, BUN 47, Potassium 4.1, Sodium 138, GFR 22 ?11/22/2021 Creatinine 2.16, BUN 46, Potassium 3.7, Sodium 136, GFR 22 ?11/14/2021 Creatinine 2.50, BUN 52, Potassium 4.7, Sodium 138, GFR 19  ?11/11/2021 Creatinine 2.49, BUN 53, Potassium 5.8, Sodium 141, GFR 19  ?11/04/2021 Creatinine 2.32, BUN 58, Potassium 3.2, Sodium 136, GFR 21  ?10/26/2021 Creatinine 2.66, BUN 81, Potassium 3.2, Sodium 136, GFR 17 ?09/21/2021 Creatinine 2.28, BUN 37, Potassium 4.4, Sodium 136, GFR 21 ?09/09/2021 Creatinine 2.74, BUN 75, Potassium 4.2, Sodium 136, GFR 17 ?A complete set of results can be found in Results Review. ?  ?Recommendations:  Copy sent to Dr Aundra Dubin for review and recommendations if needed.  ?  ?Follow-up plan: ICM clinic phone appointment on 01/09/2022 to recheck fluid levels.   91 day device clinic remote transmission 03/08/2022.   ?  ?EP/Cardiology Office Visits:   01/27/2022 with Dr Aundra Dubin.   Recall 07/02/2022 with Dr Curt Bears. ?  ?Copy of ICM check sent to Dr. Curt Bears.   ? ?3 month ICM trend: 01/02/2022. ? ? ? ?12-14 Month ICM  trend:  ? ? ? ?[name], RN ?01/02/2022 ?8:28 AM ? ?

## 2022-01-03 MED ORDER — TORSEMIDE 20 MG PO TABS: ORAL_TABLET | ORAL | 2 refills | Status: DC

## 2022-01-03 NOTE — Progress Notes (Signed)
Increase torsemide to 80 daily alternating with 60 daily.  BMET in 10 days.  ?

## 2022-01-03 NOTE — Progress Notes (Signed)
Spoke with patient and and advised Dr Aundra Dubin recommended to take Torsemide 80 mg every other day alternating with 60 mg every other day.  Advised BMET in 10 days and scheduled for 5/19 at HF clinic.  Pt verbalizes understanding and agreed with plan. She has Torsemide supply on hand and will call when refill is needed.  ?

## 2022-01-09 ENCOUNTER — Ambulatory Visit (INDEPENDENT_AMBULATORY_CARE_PROVIDER_SITE_OTHER): Payer: Medicare Other

## 2022-01-09 DIAGNOSIS — Z95810 Presence of automatic (implantable) cardiac defibrillator: Secondary | ICD-10-CM

## 2022-01-09 DIAGNOSIS — I5022 Chronic systolic (congestive) heart failure: Secondary | ICD-10-CM

## 2022-01-10 NOTE — Progress Notes (Signed)
EPIC Encounter for ICM Monitoring ? ?Patient Name: Elaine [name] is a 81 y.o. female ?Date: 01/10/2022 ?Primary Care Physican: Ginger Organ., MD ?Primary Cardiologist: Harding/McLean ?Electrophysiologist: Camnitz ?Bi-V Pacing: 97% ?09/27/2021 Weight: 225 lbs ?12/20/2021 Weight: 219 lbs ?01/03/2022 Weight: 225 lbs ?01/10/2022 Weight: 219 lbs ?                                                         ?  ?Spoke with patient and heart failure questions reviewed.  Pt weight returned to normal baseline. ?  ?CorVue thoracic impedance suggesting fluid levels returned to normal after Torsemide dosage increased  ?  ?Prescribed:   ?Torsemide 20 mg take 3 tablets (60 mg total) by mouth every other day alternating with 4 tablets (80 mg total) every other day.   ?Potassium 20 mEq take 1 tablet by mouth daily. ?Metolazone 2.5 mg Take only as directed by CHF clinic. ?  ?Labs: ?01/13/2022 BMET Scheduled ?12/06/2021 Creatinine 2.31, BUN 53, Potassium 3.9, Sodium 137, GFR 21 ?11/29/2021 Creatinine 2.18, BUN 47, Potassium 4.1, Sodium 138, GFR 22 ?11/22/2021 Creatinine 2.16, BUN 46, Potassium 3.7, Sodium 136, GFR 22 ?11/14/2021 Creatinine 2.50, BUN 52, Potassium 4.7, Sodium 138, GFR 19  ?11/11/2021 Creatinine 2.49, BUN 53, Potassium 5.8, Sodium 141, GFR 19  ?11/04/2021 Creatinine 2.32, BUN 58, Potassium 3.2, Sodium 136, GFR 21  ?10/26/2021 Creatinine 2.66, BUN 81, Potassium 3.2, Sodium 136, GFR 17 ?09/21/2021 Creatinine 2.28, BUN 37, Potassium 4.4, Sodium 136, GFR 21 ?09/09/2021 Creatinine 2.74, BUN 75, Potassium 4.2, Sodium 136, GFR 17 ?A complete set of results can be found in Results Review. ?  ?Recommendations:  No changes and encouraged to call if experiencing any fluid symptoms. ?  ?Follow-up plan: ICM clinic phone appointment on 02/06/2022.   91 day device clinic remote transmission 03/08/2022.   ?  ?EP/Cardiology Office Visits:   01/27/2022 with Dr Aundra Dubin.   Recall 07/02/2022 with Dr Curt Bears. ?  ?Copy of ICM check sent to Dr. Curt Bears.    ? ?3 month ICM trend: 01/09/2022. ? ? ? ?12-14 Month ICM trend:  ? ? ? ?[name], RN ?01/10/2022 ?4:45 PM ? ?

## 2022-01-13 ENCOUNTER — Ambulatory Visit (HOSPITAL_COMMUNITY)
Admission: RE | Admit: 2022-01-13 | Discharge: 2022-01-13 | Disposition: A | Discharge status: Home / Self Care | Payer: Medicare Other | Source: Ambulatory Visit | Attending: Internal Medicine | Admitting: Internal Medicine

## 2022-01-13 DIAGNOSIS — I5022 Chronic systolic (congestive) heart failure: Secondary | ICD-10-CM | POA: Insufficient documentation

## 2022-01-13 LAB — BASIC METABOLIC PANEL
Anion gap: 13 (ref 5–15)
BUN: 62 mg/dL — ABNORMAL HIGH (ref 8–23)
CO2: 28 mmol/L (ref 22–32)
Calcium: 10 mg/dL (ref 8.9–10.3)
Chloride: 98 mmol/L (ref 98–111)
Creatinine, Ser: 2.17 mg/dL — ABNORMAL HIGH (ref 0.44–1.00)
GFR, Estimated: 22 mL/min — ABNORMAL LOW (ref >60)
Glucose, Bld: 125 mg/dL — ABNORMAL HIGH (ref 70–99)
Potassium: 4.2 mmol/L (ref 3.5–5.1)
Sodium: 139 mmol/L (ref 135–145)

## 2022-01-27 ENCOUNTER — Encounter (HOSPITAL_COMMUNITY): Payer: Self-pay | Admitting: Cardiology

## 2022-01-27 ENCOUNTER — Ambulatory Visit (HOSPITAL_COMMUNITY)
Admission: RE | Admit: 2022-01-27 | Discharge: 2022-01-27 | Disposition: A | Discharge status: Home / Self Care | Payer: Medicare Other | Source: Ambulatory Visit | Attending: Cardiology | Admitting: Cardiology

## 2022-01-27 VITALS — BP 124/70 | HR 73 | Ht 62.0 in | Wt 222.4 lb

## 2022-01-27 DIAGNOSIS — Z79899 Other long term (current) drug therapy: Secondary | ICD-10-CM | POA: Insufficient documentation

## 2022-01-27 DIAGNOSIS — E1122 Type 2 diabetes mellitus with diabetic chronic kidney disease: Secondary | ICD-10-CM | POA: Insufficient documentation

## 2022-01-27 DIAGNOSIS — Z95810 Presence of automatic (implantable) cardiac defibrillator: Secondary | ICD-10-CM | POA: Diagnosis not present

## 2022-01-27 DIAGNOSIS — Z8249 Family history of ischemic heart disease and other diseases of the circulatory system: Secondary | ICD-10-CM | POA: Insufficient documentation

## 2022-01-27 DIAGNOSIS — Z87440 Personal history of urinary (tract) infections: Secondary | ICD-10-CM | POA: Diagnosis not present

## 2022-01-27 DIAGNOSIS — Z7985 Long-term (current) use of injectable non-insulin antidiabetic drugs: Secondary | ICD-10-CM | POA: Diagnosis not present

## 2022-01-27 DIAGNOSIS — I4821 Permanent atrial fibrillation: Secondary | ICD-10-CM | POA: Diagnosis not present

## 2022-01-27 DIAGNOSIS — I5022 Chronic systolic (congestive) heart failure: Secondary | ICD-10-CM | POA: Diagnosis not present

## 2022-01-27 DIAGNOSIS — J449 Chronic obstructive pulmonary disease, unspecified: Secondary | ICD-10-CM | POA: Insufficient documentation

## 2022-01-27 DIAGNOSIS — Z951 Presence of aortocoronary bypass graft: Secondary | ICD-10-CM | POA: Insufficient documentation

## 2022-01-27 DIAGNOSIS — I2510 Atherosclerotic heart disease of native coronary artery without angina pectoris: Secondary | ICD-10-CM | POA: Diagnosis not present

## 2022-01-27 DIAGNOSIS — I4819 Other persistent atrial fibrillation: Secondary | ICD-10-CM | POA: Insufficient documentation

## 2022-01-27 DIAGNOSIS — I255 Ischemic cardiomyopathy: Secondary | ICD-10-CM | POA: Diagnosis not present

## 2022-01-27 DIAGNOSIS — Z7901 Long term (current) use of anticoagulants: Secondary | ICD-10-CM | POA: Diagnosis not present

## 2022-01-27 DIAGNOSIS — N184 Chronic kidney disease, stage 4 (severe): Secondary | ICD-10-CM | POA: Diagnosis not present

## 2022-01-27 MED ORDER — METOPROLOL SUCCINATE ER 100 MG PO TB24
ORAL_TABLET | ORAL | 3 refills | Status: DC
Start: 2022-01-27 — End: 2022-06-10

## 2022-01-27 MED ORDER — METOLAZONE 2.5 MG PO TABS: ORAL_TABLET | ORAL | 2 refills | Status: DC

## 2022-01-27 NOTE — Progress Notes (Signed)
ID:  JOLA CRITZER, DOB [birth year], MRN [id number]  Provider location: [facility] Type of Visit:  Heart Failure Follow up  PCP:  Marton Redwood, MD  Cardiologist:  Glenetta Hew, MD Primary HF: Dr. Aundra Dubin EP: Dr Tomasa Blase   History of Present Illness: Elaine [name] is a 81 y.o. with history of CAD s/p CABG, COPD, CKD stage 3, and chronic systolic systolic CHF/ischemic cardiomyopathy.  She was referred by Dr. Ellyn Hack for evaluation of CHF.  Patient was admitted in 2/15 with atrial fibrillation/RVR and chest pain.  Cath showed severe 3VD and she had CABG x 3.  Echo in 2/15 showed EF down to 20-25%. In 2018, it appears that she went back into atrial fibrillation and has remained in atrial fibrillation persistently.  DCCV was not attempted.   Most recent echo in 10/20 showed EF < 20% with normal RV.  She had a St Jude CRT-D device implanted in 11/20.  She says that she feels "90%" better with CRT.  She is followed by Dr. Hollie Salk with nephrology.   She has been in atrial fibrillation persistently, I attempted DCCV while on amiodarone but she did not convert.  Therefore, I stopped amiodarone.    Echo in 3/21 showed EF 20% with diffuse hypokinesis, mildly dilated RV with mildly decreased systolic function, PASP 49 mmHg, dilated IVC.  In 9/21, she had AV nodal ablation.    She stopped empagliflozin due to UTIs and yeast infections.   Echo 12/22 EF 20-25%, mildly decreased RV function.   In May diuretic regimen increased 80 mg one day then 60 mg daily the next. CorVue interrogation suggested fluids returned to normal.   Today she returns for HF follow up.Overall feeling fine. SOB with steps and with certain household activities. Denies PND/Orthopnea. Appetite ok. No fever or chills.She has a talking scale. Weight at home  pounds 217 pounds. Taking all medications. She takes metolazone when her weight goes up 5 pounds. She will take metolazone 2 days in a row. Last week she took  metolazone for 2 days.   ReDs:34%.   Labs (2/21): K 4.5, creatinine 2.08 => 2.99, hgb 12.8 Labs (7/21): K 4, creatinine 2.3, TSH normal Labs (12/21): K 4, creatinine 2.31, BNP 671 Labs (1/22): K 4.1, creatinine 2.48  Labs (3/22): LDL 77 Labs (6/22): K 4.9, creatinine 2.26 Labs (10/22): K 4.8, creatinine 2.41 Labs (12/22): LDL 62, hgb 13.5 Labs (2/23): K 4.4, creatinine 2.28 Labs (11/11/21): K 5.8  Labs (3/23): BUN 52 Creatinine 2.5 K 4.7  Labs ( 12/2021): K 4.2 Creatinine 2.17   PMH: 1. CAD: s/p CABG in 2/15 with LIMA-LAD, SVG-OM2, SVG-PLV.  2. LBBB: Chronic.  3. Atrial fibrillation: Persistent since 2018.  - LA appendage clip with CABG.  4. H/o aspiration PNA 5. Type 2 diabetes 6. COPD 7. CKD stage 3 8. Chronic systolic CHF: Ischemic cardiomyopathy.  She has a St Jude BiV ICD.  - Echo (2/15): EF 20-25%.  - Echo (5/15): EF 40-45% - Echo (10/20): EF <20%, mild LVH, normal RV size and systolic function.  - Echo (3/21): EF 20% with diffuse hypokinesis, mildly dilated RV with mildly decreased systolic function, PASP 49 mmHg, dilated IVC.  - AV nodal ablation - Echo (12/22): EF 20-25%, mildly decreased RV function. 9. CKD: Stage 3.   Social History   Socioeconomic History   Marital status: Widowed    Spouse name: Not on file   Number of children: Not on file  Years of education: Not on file   Highest education level: Not on file  Occupational History   Occupation: retired  Tobacco Use   Smoking status: Former    Packs/day: 0.50    Years: 39.00    Pack years: 19.50    Types: Cigarettes    Quit date: 08/28/1993    Years since quitting: 28.4   Smokeless tobacco: Never  Vaping Use   Vaping Use: Never used  Substance and Sexual Activity   Alcohol use: No    Comment: quit 95   Drug use: No   Sexual activity: Not on file  Other Topics Concern   Not on file  Social History Narrative   Lives with husband and son in [place].  She is currently retired.   Former smoker  quit in 1995.   Social Determinants of Health   Financial Resource Strain: Not on file  Food Insecurity: Not on file  Transportation Needs: Not on file  Physical Activity: Not on file  Stress: Not on file  Social Connections: Not on file  Intimate Partner Violence: Not on file   Family History  Adopted: Yes  Problem Relation Age of Onset   Other Mother    Hypertension Mother    Colon cancer Neg Hx    Esophageal cancer Neg Hx    Rectal cancer Neg Hx    Stomach cancer Neg Hx    Current Outpatient Medications  Medication Sig Dispense Refill   acetaminophen (TYLENOL) 650 MG CR tablet Take 1,300 mg by mouth 2 (two) times daily.     albuterol (VENTOLIN HFA) 108 (90 Base) MCG/ACT inhaler Inhale 2 puffs into the lungs every 6 (six) hours as needed for wheezing or shortness of breath.     apixaban (ELIQUIS) 2.5 MG TABS tablet Take 2.5 mg by mouth 2 (two) times daily.     Coenzyme Q10 (COQ-10) 100 MG CAPS Take 100 mg by mouth in the morning and at bedtime.      colchicine 0.6 MG tablet Take 0.6 mg by mouth daily as needed (Gout).      Dulaglutide (TRULICITY [place]) Inject 1 Dose into the skin once a week.     ezetimibe (ZETIA) 10 MG tablet Take 10 mg by mouth daily.     ferrous sulfate 325 (65 FE) MG tablet Take 325 mg by mouth every other day.     isosorbide-hydrALAZINE (BIDIL) 20-37.5 MG tablet Take 0.5 tablets by mouth 3 (three) times daily. 135 tablet 3   levothyroxine (SYNTHROID) 112 MCG tablet Take 1 tablet (112 mcg total) by mouth daily before breakfast. 30 tablet 12   metolazone (ZAROXOLYN) 2.5 MG tablet TAKE ONLY AS DIRECTED BY CHF CLINIC 10 tablet 0   metoprolol succinate (TOPROL-XL) 100 MG 24 hr tablet Take 1 tablet (100 mg total) by mouth every morning AND 0.5 tablets (50 mg total) every evening. Take with or immediately following a meal.. 135 tablet 3   Multiple Vitamins-Minerals (PRESERVISION AREDS 2) CAPS Take 1 capsule by mouth 2 (two) times daily.     potassium chloride SA  (KLOR-CON M20) 20 MEQ tablet Take 1 tablet (20 mEq total) by mouth daily. 30 tablet 2   rosuvastatin (CRESTOR) 5 MG tablet Take 5 mg by mouth every other day.      torsemide (DEMADEX) 20 MG tablet Take 4 tablets (80 mg total) every other day alternating with 3 tablets (60 mg total) every other day 315 tablet 2   umeclidinium-vilanterol (ANORO ELLIPTA) 62.5-25 MCG/INH  AEPB Inhale 1 puff into the lungs daily.     No current facility-administered medications for this encounter.   Wt Readings from Last 3 Encounters:  01/27/22 100.9 kg (222 lb 6.4 oz)  11/29/21 105.7 kg (233 lb)  11/17/21 102.2 kg (225 lb 3.2 oz)   BP 124/70   Pulse 73   Ht '5\' 2"'$  (1.575 m)   Wt 100.9 kg (222 lb 6.4 oz)   SpO2 96%   BMI 40.68 kg/m  Wt Readings from Last 3 Encounters:  01/27/22 100.9 kg (222 lb 6.4 oz)  11/29/21 105.7 kg (233 lb)  11/17/21 102.2 kg (225 lb 3.2 oz)   Reds Clip 34%   General:  Arrived in wheelchair.  No resp difficulty HEENT: normal Neck: supple. no JVD. Carotids 2+ bilat; no bruits. No lymphadenopathy or thryomegaly appreciated. Cor: PMI nondisplaced. Regular rate & rhythm. No rubs, gallops or murmurs. Lungs: clear Abdomen: soft, nontender, nondistended. No hepatosplenomegaly. No bruits or masses. Good bowel sounds. Extremities: no cyanosis, clubbing, rash, edema Neuro: alert & orientedx3, cranial nerves grossly intact. moves all 4 extremities w/o difficulty. Affect pleasant  Assessment/Plan: 1.Chronic systolic CHF: Ischemic cardiomyopathy.  St Jude CRT-D device, now s/p AV nodal ablation.  Echo in 3/21 showed EF 20% with diffuse hypokinesis, mildly dilated RV with mildly decreased systolic function, PASP 49 mmHg, dilated IVC.  Echo 12/22 showed EF 20-25%, mildly decreased RV function.   NYHA III. Volume status stable. Continue current dose of torsemide 80 mg daily alternating with 60 mg daily. Continue metolazone as needed. She has good handle on when to take extra metolazone.  -  Continue  20 mEq KCL daily. - She was unable to tolerate Bidil 1 tab tid, but she does fine with Bidil 1/2 tab tid so will continue.  - Continue Toprol XL 100 qam/50 qpm.   - Off empagliflozin due to yeast infection and UTI.  - Off spironolactone with side effects.  2. CAD: S/p CABG in 2015. No chest pain. - No ASA given apixaban use.  - Continue Crestor 5 daily + Zetia, good lipids 12/22. 3. Atrial fibrillation: Chronic now, looks like it has gone on since 2018.  Unable to cardiovert her on amiodarone so it was stopped.  At this point Afib chronic. She is now s/p AVN ablation to allow more BiV pacing. Rate controlled.   - Continue apixaban.  4. CKD: Stage 4: SCr baseline 2.3-2.5.  I reviewed BMET from 01/13/22, stable.   Follow up 3-4 months with APP.    Jeanmarie Hubert, NP  01/27/2022  [facility] [address] [facility] [place] [address] [phone] (office) [phone] (fax)

## 2022-01-27 NOTE — Progress Notes (Signed)
ReDS Vest / Clip - 01/27/22 1000       ReDS Vest / Clip   Station Marker A    Ruler Value 31    ReDS Value Range Low volume    ReDS Actual Value 34

## 2022-01-27 NOTE — Patient Instructions (Signed)
There has been no changes to your medications. ? ?Your physician recommends that you schedule a follow-up appointment in: 3 months. ? ?If you have any questions or concerns before your next appointment please send us a message through mychart or call our office at [phone].   ? ?TO LEAVE A MESSAGE FOR THE NURSE SELECT OPTION 2, PLEASE LEAVE A MESSAGE INCLUDING: ?YOUR NAME ?DATE OF BIRTH ?CALL BACK NUMBER ?REASON FOR CALL**this is important as we prioritize the call backs ? ?YOU WILL RECEIVE A CALL BACK THE SAME DAY AS LONG AS YOU CALL BEFORE 4:00 PM ? ?At the [facility], you and your health needs are our priority. As part of our continuing mission to provide you with exceptional heart care, we have created designated Provider Care Teams. These Care Teams include your primary Cardiologist (physician) and Advanced Practice Providers (APPs- Physician Assistants and Nurse Practitioners) who all work together to provide you with the care you need, when you need it.  ? ?You may see any of the following providers on your designated Care Team at your next follow up: ?Dr Daniel Bensimhon ?Dr Dalton McLean ?Amy Clegg, NP ?Brittainy Simmons, PA ?Jessica Milford,NP ?Lindsay Finch, PA ?Lauren Kemp, PharmD ? ? ?Please be sure to bring in all your medications bottles to every appointment.  ? ? ?

## 2022-02-06 ENCOUNTER — Ambulatory Visit (INDEPENDENT_AMBULATORY_CARE_PROVIDER_SITE_OTHER): Payer: Medicare Other

## 2022-02-06 DIAGNOSIS — Z95810 Presence of automatic (implantable) cardiac defibrillator: Secondary | ICD-10-CM | POA: Diagnosis not present

## 2022-02-06 DIAGNOSIS — I5022 Chronic systolic (congestive) heart failure: Secondary | ICD-10-CM | POA: Diagnosis not present

## 2022-02-10 NOTE — Progress Notes (Signed)
EPIC Encounter for ICM Monitoring  Patient Name: Elaine [name] is a 81 y.o. female Date: 02/10/2022 Primary Care Physican: Ginger Organ., MD Primary Cardiologist: Harding/McLean Electrophysiologist: Cyndi Bender Pacing: 97% 09/27/2021 Weight: 225 lbs 12/20/2021 Weight: 219 lbs 01/03/2022 Weight: 225 lbs 01/10/2022 Weight: 219 lbs                                                             Spoke with patient and heart failure questions reviewed.  Pt reports feeling okay at this time.   CorVue thoracic impedance normal but was suggesting possible fluid accumulation 5/20-5/27.    Prescribed:   Torsemide 20 mg take 3 tablets (60 mg total) by mouth every other day alternating with 4 tablets (80 mg total) every other day.   Potassium 20 mEq take 1 tablet by mouth daily. Metolazone 2.5 mg Take only as directed by CHF clinic.   Labs: 01/13/2022 Creatinine 2.17, BUN 62, Potassium 4.2, Sodium 139, GFR 22 12/06/2021 Creatinine 2.31, BUN 53, Potassium 3.9, Sodium 137, GFR 21 11/29/2021 Creatinine 2.18, BUN 47, Potassium 4.1, Sodium 138, GFR 22 11/22/2021 Creatinine 2.16, BUN 46, Potassium 3.7, Sodium 136, GFR 22 11/14/2021 Creatinine 2.50, BUN 52, Potassium 4.7, Sodium 138, GFR 19  11/11/2021 Creatinine 2.49, BUN 53, Potassium 5.8, Sodium 141, GFR 19  11/04/2021 Creatinine 2.32, BUN 58, Potassium 3.2, Sodium 136, GFR 21  10/26/2021 Creatinine 2.66, BUN 81, Potassium 3.2, Sodium 136, GFR 17 09/21/2021 Creatinine 2.28, BUN 37, Potassium 4.4, Sodium 136, GFR 21 09/09/2021 Creatinine 2.74, BUN 75, Potassium 4.2, Sodium 136, GFR 17 A complete set of results can be found in Results Review.   Recommendations:  No changes and encouraged to call if experiencing any fluid symptoms.   Follow-up plan: ICM clinic phone appointment on 03/13/2022.   91 day device clinic remote transmission 03/08/2022.     EP/Cardiology Office Visits:   05/02/2022 with HF clinic.   Recall 07/02/2022 with Dr Curt Bears.   Copy  of ICM check sent to Dr. Curt Bears.    3 month ICM trend: 02/06/2022.    12-14 Month ICM trend:     [name], RN 02/10/2022 4:13 PM

## 2022-02-16 ENCOUNTER — Other Ambulatory Visit (HOSPITAL_COMMUNITY): Payer: Self-pay | Admitting: Family Medicine

## 2022-02-22 ENCOUNTER — Other Ambulatory Visit (HOSPITAL_COMMUNITY): Payer: Self-pay | Admitting: Family Medicine

## 2022-03-07 ENCOUNTER — Ambulatory Visit (INDEPENDENT_AMBULATORY_CARE_PROVIDER_SITE_OTHER): Payer: Medicare Other | Admitting: Orthopaedic Surgery

## 2022-03-07 ENCOUNTER — Encounter: Payer: Self-pay | Admitting: Orthopaedic Surgery

## 2022-03-07 DIAGNOSIS — G5601 Carpal tunnel syndrome, right upper limb: Secondary | ICD-10-CM

## 2022-03-07 NOTE — Progress Notes (Signed)
The patient is well-known to me.  She has well-documented severe carpal tunnel syndrome involving her right upper extremity.  At this point she does wish to proceed with a carpal tunnel release.  She does have a history of congestive heart failure.  She also has a pacemaker.  She understands that this can be done under just local anesthesia but I would still prefer this to be done at a surgical center such as [facility] so that can be done in a more controlled environment.  Examination right hand does show muscle atrophy.  There is weak grip and pinch strength.  She has a positive Phalen's and Tinel's sign with numbness in the median nerve distribution.  I explained in detail what the surgery involves.  There is still risk of infection.  This can be done under just local anesthesia with a forearm tourniquet.  The sutures would then remain in for 2 weeks and we would see her back in 2 weeks postoperative.  If light sedation is needed, it would be better to have this provided at an outpatient surgical center environment.  We will be in touch about scheduling surgery.

## 2022-03-08 ENCOUNTER — Ambulatory Visit (INDEPENDENT_AMBULATORY_CARE_PROVIDER_SITE_OTHER): Payer: Medicare Other

## 2022-03-08 DIAGNOSIS — I255 Ischemic cardiomyopathy: Secondary | ICD-10-CM | POA: Diagnosis not present

## 2022-03-08 LAB — CUP PACEART REMOTE DEVICE CHECK
Battery Remaining Longevity: 64 mo
Battery Remaining Percentage: 66 %
Battery Voltage: 2.96 V
Date Time Interrogation Session: 20230712020146
HighPow Impedance: 66 Ohm
Implantable Lead Implant Date: 20201130
Implantable Lead Implant Date: 20201130
Implantable Lead Location: 753858
Implantable Lead Location: 753860
Implantable Pulse Generator Implant Date: 20201130
Lead Channel Impedance Value: 460 Ohm
Lead Channel Impedance Value: 550 Ohm
Lead Channel Pacing Threshold Amplitude: 0.75 V
Lead Channel Pacing Threshold Amplitude: 1.5 V
Lead Channel Pacing Threshold Pulse Width: 0.4 ms
Lead Channel Pacing Threshold Pulse Width: 0.4 ms
Lead Channel Sensing Intrinsic Amplitude: 12 mV
Lead Channel Setting Pacing Amplitude: 2 V
Lead Channel Setting Pacing Amplitude: 2.5 V
Lead Channel Setting Pacing Pulse Width: 0.4 ms
Lead Channel Setting Pacing Pulse Width: 0.4 ms
Lead Channel Setting Sensing Sensitivity: 0.5 mV
Pulse Gen Serial Number: 810000193

## 2022-03-13 ENCOUNTER — Ambulatory Visit (INDEPENDENT_AMBULATORY_CARE_PROVIDER_SITE_OTHER): Payer: Medicare Other

## 2022-03-13 DIAGNOSIS — Z95810 Presence of automatic (implantable) cardiac defibrillator: Secondary | ICD-10-CM

## 2022-03-13 DIAGNOSIS — I5022 Chronic systolic (congestive) heart failure: Secondary | ICD-10-CM

## 2022-03-15 NOTE — Progress Notes (Signed)
EPIC Encounter for ICM Monitoring  Patient Name: Elaine [name] is a 81 y.o. female Date: 03/15/2022 Primary Care Physican: Ginger Organ., MD Primary Cardiologist: Harding/McLean Electrophysiologist: Cyndi Bender Pacing: 97% 09/27/2021 Weight: 225 lbs 12/20/2021 Weight: 219 lbs 01/03/2022 Weight: 225 lbs 01/10/2022 Weight: 219 lbs 03/15/2022 Weight: 217 lbs                                                            Spoke with patient and heart failure questions reviewed.  Pt took extra Torsemide for leg swelling.    CorVue thoracic impedance suggesting normal fluid levels.  Prescribed:   Torsemide 20 mg take 3 tablets (60 mg total) by mouth every other day alternating with 4 tablets (80 mg total) every other day.   Potassium 20 mEq take 1 tablet by mouth daily. Metolazone 2.5 mg Take only as directed by CHF clinic.   Labs: 01/13/2022 Creatinine 2.17, BUN 62, Potassium 4.2, Sodium 139, GFR 22 12/06/2021 Creatinine 2.31, BUN 53, Potassium 3.9, Sodium 137, GFR 21 11/29/2021 Creatinine 2.18, BUN 47, Potassium 4.1, Sodium 138, GFR 22 11/22/2021 Creatinine 2.16, BUN 46, Potassium 3.7, Sodium 136, GFR 22 11/14/2021 Creatinine 2.50, BUN 52, Potassium 4.7, Sodium 138, GFR 19  11/11/2021 Creatinine 2.49, BUN 53, Potassium 5.8, Sodium 141, GFR 19  11/04/2021 Creatinine 2.32, BUN 58, Potassium 3.2, Sodium 136, GFR 21  10/26/2021 Creatinine 2.66, BUN 81, Potassium 3.2, Sodium 136, GFR 17 09/21/2021 Creatinine 2.28, BUN 37, Potassium 4.4, Sodium 136, GFR 21 09/09/2021 Creatinine 2.74, BUN 75, Potassium 4.2, Sodium 136, GFR 17 A complete set of results can be found in Results Review.   Recommendations:  No changes and encouraged to call if experiencing any fluid symptoms.   Follow-up plan: ICM clinic phone appointment on 04/17/2022.   91 day device clinic remote transmission 06/07/2022.     EP/Cardiology Office Visits:   05/02/2022 with HF clinic.   Recall 07/02/2022 with Dr Curt Bears.   Copy of  ICM check sent to Dr. Curt Bears.    3 month ICM trend: 03/13/2022.    12-14 Month ICM trend:     [name], RN 03/15/2022 4:43 PM

## 2022-03-28 NOTE — Progress Notes (Signed)
Remote ICD transmission.   

## 2022-04-10 ENCOUNTER — Other Ambulatory Visit: Payer: Self-pay | Admitting: Physician Assistant

## 2022-04-12 ENCOUNTER — Encounter (HOSPITAL_COMMUNITY): Payer: Self-pay | Admitting: Orthopaedic Surgery

## 2022-04-12 ENCOUNTER — Encounter: Payer: Self-pay | Admitting: Cardiology

## 2022-04-12 ENCOUNTER — Other Ambulatory Visit: Payer: Self-pay

## 2022-04-12 NOTE — Progress Notes (Unsigned)
PERIOPERATIVE PRESCRIPTION FOR IMPLANTED CARDIAC DEVICE PROGRAMMING  Patient Information: Name:  Elaine [name]  DOB:  Jun 28, 1941  MRN:  [id number]  {TIP - You do not have to delete this tip  -  Copy the info from the staff message sent by the PAT staff  then press F2 here and paste the information using CTL - V on the next line :[id number]}  Planned Procedure:  Right Carpal Tunnel Release Open  Surgeon:  Dr. Jean Rosenthal  Date of Procedure:  04/13/22  Cautery will be used.  Position during surgery:  Supine   Please send documentation back to:  [facility] (Fax # [phone])  Device Information:  Clinic EP Physician:  Allegra Lai, MD   Device Type:  Pacemaker and Defibrillator Manufacturer and Phone #:  St. Jude/[employer]: [phone] Pacemaker Dependent?:  Yes.   Date of Last Device Check:  03/08/2022 Normal Device Function?:  Yes.    Electrophysiologist's Recommendations:  Have magnet available. Provide continuous ECG monitoring when magnet is used or reprogramming is to be performed.  Procedure will likely interfere with device function.  Device should be programmed:  Tachy therapies disabled and Asynchronous pacing during procedure and returned to normal programming after procedure  Per Device Clinic Standing Orders, Wanda Plump, RN  4:28 PM 04/12/2022

## 2022-04-12 NOTE — Anesthesia Preprocedure Evaluation (Addendum)
Anesthesia Evaluation  Patient identified by MRN, date of birth, ID band Patient awake    Reviewed: Allergy & Precautions, NPO status , Patient's Chart, lab work & pertinent test results  History of Anesthesia Complications Negative for: history of anesthetic complications  Airway Mallampati: II  TM Distance: >3 FB Neck ROM: Full    Dental  (+) Edentulous Upper, Missing   Pulmonary neg pulmonary ROS, former smoker,    Pulmonary exam normal        Cardiovascular hypertension, + CAD, + Past MI, + CABG and +CHF  Normal cardiovascular exam+ dysrhythmias (s/p AV nodal ablation) Atrial Fibrillation + pacemaker (BiV) + Cardiac Defibrillator   Echo 08/24/21: IMPRESSIONS  1. Left ventricular ejection fraction, by estimation, is 20 to 25%. The  left ventricle has severely decreased function. The left ventricle  demonstrates global hypokinesis. The left ventricular internal cavity size  was mildly dilated. Left ventricular  diastolic parameters are indeterminate.  2. Right ventricular systolic function is mildly reduced. The right  ventricular size is normal. There is normal pulmonary artery systolic  pressure. The estimated right ventricular systolic pressure is 28.2 mmHg.  3. Left atrial size was moderately dilated.  4. Right atrial size was mildly dilated.  5. The mitral valve is degenerative. Mild mitral valve regurgitation. No  evidence of mitral stenosis. Moderate mitral annular calcification.  6. The aortic valve is tricuspid. Aortic valve regurgitation is not  visualized. Aortic valve sclerosis/calcification is present, without any  evidence of aortic stenosis.  7. The inferior vena cava is normal in size with greater than 50%  respiratory variability, suggesting right atrial pressure of 3 mmHg.    Neuro/Psych negative neurological ROS     GI/Hepatic negative GI ROS, Neg liver ROS,   Endo/Other  diabetes, Type  2Hypothyroidism Morbid obesity  Renal/GU CRFRenal disease  negative genitourinary   Musculoskeletal negative musculoskeletal ROS (+)   Abdominal   Peds  Hematology negative hematology ROS (+)   Anesthesia Other Findings   Reproductive/Obstetrics                        Anesthesia Physical Anesthesia Plan  ASA: 4  Anesthesia Plan: MAC   Post-op Pain Management: Tylenol PO (pre-op)*   Induction: Intravenous  PONV Risk Score and Plan: 2 and Treatment may vary due to age or medical condition  Airway Management Planned: Natural Airway, Nasal Cannula and Simple Face Mask  Additional Equipment: None  Intra-op Plan:   Post-operative Plan:   Informed Consent: I have reviewed the patients History and Physical, chart, labs and discussed the procedure including the risks, benefits and alternatives for the proposed anesthesia with the patient or authorized representative who has indicated his/her understanding and acceptance.       Plan Discussed with:   Anesthesia Plan Comments: (Plan for local + light sedation. Patient understands she will be awake during procedure.)       Anesthesia Quick Evaluation

## 2022-04-12 NOTE — Progress Notes (Signed)
Anesthesia Chart Review: Elaine [name]  Case: [id number] Date/Time: 04/13/22 1110   Procedure: RIGHT OPEN CARPAL TUNNEL RELEASE (Right)   Anesthesia type: LOCAL   Pre-op diagnosis: right carpal tunnel syndrome   Location: [facility] OR ROOM 08 / [place] [facility]   Surgeons: Mcarthur Rossetti, MD       DISCUSSION: Patient is an 81 year old female scheduled for the above procedure.  History includes former smoker (quit 08/28/93), CAD (anterior MI, CABG 10/23/13: LIMA-LAD, SVG-OM2, SVG-RPL), afib (diagnosed 09/2013, s/p LA clip 10/23/13 at time of CABG; persistent afib 2018; DCCV 10/22/19; AV nodal ablation 05/19/20), ischemic cardiomyopathy, chronic combined systolic and diastolic CHF, ICD (St. Jude/Abbott Bi-V ICD 07/28/19), murmur (mild MR 07/2021), left BBB, HLD, COPD, DM2 (with neuropathy), CKD (stage 3), hypothyroidism, osteoarthritis (right THA 11/05/07, left THA 12/11/08), right breast lumpectomy (10/06/14 for atypical lobular hyperplasia), obesity.   Last visit at the HF clinic was with Darrick Grinder, NP on 01/27/22. Volume status stable on current regimen. No chest pain. Not on ASA given apixaban use. Baseline SCr ~ 2.3-2.5 and is followed by Dr. Hollie Salk. 3-4 month follow-up planned. CorVue thoracic impedance suggested normal fluid levels as of 03/13/22.   Preliminary EP recommendations indicate that patient is pacer dependent. (EP recommendation note per Theodoro Doing, RN is not yet finalized.)  Per 03/07/22 note by Dr. Ninfa Linden, although he thinks procedure can be done under local anesthesia with a forearm tourniquet, he felt that due to her cardiac history she should have procedure done at least at an out-patient surgery center. Case now scheduled at [place]. Patient reported instructions to hold Eliquis after 04/10/22 dose.   She is a same day work-up, so anesthesia team eo evaluate on the day of surgery.    VS:  BP Readings from Last 3 Encounters:  01/27/22 124/70  11/29/21 106/64  11/17/21 122/60    Pulse Readings from Last 3 Encounters:  01/27/22 73  11/29/21 73  11/17/21 73     PROVIDERS: Ginger Organ., MD is PCP   Glenetta Hew, MD is primary cardiologist. Last visit 09/02/20. He noted last ischemic evaluation was in 2016, but further reduced EF likely related to afib and LBBB. She was not having any active symptoms, so he did not think a stress test would be helpful. If worsening HF symptoms that become difficult to manage then could consider a RHC in the future. She has primarily been followed at the HF clinic since.   Loralie Champagne, MD is HF cardiologist. See above.  Allegra Lai, MD is EP cardiologist. Last visit 07/07/21. Jardiance stopped due to frequent UTI/yeast. Known permanent afib. ICD functioning appropriately. S/p AV node ablation 04/2020 to increase BiV pacing. Base pacing rate decreased to 70 bpm and turned on rate response. 12 month follow-up planned.  Madelon Lips, MD is nephrologist   LABS: Labs on arrival as indicated. Most recent results include: Lab Results  Component Value Date   WBC 7.2 08/24/2021   HGB 13.5 08/24/2021   HCT 39.7 08/24/2021   PLT 226 08/24/2021   GLUCOSE 125 (H) 01/13/2022   NA 139 01/13/2022   K 4.2 01/13/2022   CL 98 01/13/2022   CREATININE 2.17 (H) 01/13/2022   BUN 62 (H) 01/13/2022   CO2 28 01/13/2022    EKG: 07/07/21 ([facility]): AF at 80 bpm, V paced   CV: Echo 08/24/21: IMPRESSIONS   1. Left ventricular ejection fraction, by estimation, is 20 to 25%. The  left ventricle has  severely decreased function. The left ventricle  demonstrates global hypokinesis. The left ventricular internal cavity size  was mildly dilated. Left ventricular  diastolic parameters are indeterminate.   2. Right ventricular systolic function is mildly reduced. The right  ventricular size is normal. There is normal pulmonary artery systolic  pressure. The estimated right ventricular systolic pressure is 17.4 mmHg.   3. Left  atrial size was moderately dilated.   4. Right atrial size was mildly dilated.   5. The mitral valve is degenerative. Mild mitral valve regurgitation. No  evidence of mitral stenosis. Moderate mitral annular calcification.   6. The aortic valve is tricuspid. Aortic valve regurgitation is not  visualized. Aortic valve sclerosis/calcification is present, without any  evidence of aortic stenosis.   7. The inferior vena cava is normal in size with greater than 50%  respiratory variability, suggesting right atrial pressure of 3 mmHg.  - Comparison echo 11/11/19 : LVEF 20%, LV global hypokinesis, RVSF mildly reduced, moderately elevated PASP, estimated RVSP 48.6 mmHg, moderate dilated LA, trivial MR.    Cardiac cath 09/08/2014: Severe native CAD with occluded LAD after 70-80% Left Main, and diffuse RCA disease as previously described/   Widely patent grafts to moderately diseased target vessels. Known mild to moderate LV dysfunction, and chronic atrial fibrillation Likely False Positive Myoview Stress Test   Past Medical History:  Diagnosis Date   Acute myocardial infarction of anterior wall (HCC) 10/20/2013   Afib, LBBB   Acute pulmonary edema with congestive heart failure ([place]) 10/20/2013   Resolved   Arthritis    Atrial fibrillation ([place]) 10/20/2013   On Warfarin   CAD, multiple vessel 10/20/2013   LM, LAD & RCA --> CABG; MYOVIEW 12/'15: Intermediate Risk would large anterior, anteroapical segment the apex possible infarct and peri-infarct ischemia   CHF (congestive heart failure) (HCC)    EF 40-45%.   Chronic kidney disease    COPD (chronic obstructive pulmonary disease) (HCC)    Diabetic neuropathy (HCC)    Heart murmur    Likely related to aortic sclerosis   Hyperlipidemia    Hypertension    Hypothyroidism    Ischemic cardiomyopathy - Notable Improvement in EF post CABG 10/20/2013   Echo 2/23: EF 20-25%; mild LVH. anteroseptal Akinesis; mid-apical anterior, inferior & inferoseptal +  apical-lateral akinesis; G 1 DDysfxn.; Mild-Mod LA dil; mild MR; Mod PHTN;; F/u Echo 12/2013: EF 40-45%, septal & apical HK, mild LVH; Mod LA dilation   Left bundle branch block (LBBB) on electrocardiogram    Mild mitral regurgitation by prior echocardiogram    Mild-Mod MR on Echo   Morbid obesity ([place])    OSTEOARTHRITIS 08/06/2006   S/P CABG x 3 10/23/2013   LIMA to LAD, SVG to OM2, SVG to RPLB, EVH via right thigh   Type II diabetes mellitus with complication ([place]) 04/10/4817   off rx since heart attack 12/15-dr told could stay off if keep cbg under 150   UTI (lower urinary tract infection) 09/29/14   ongoing now. started rx 09/28/14    Past Surgical History:  Procedure Laterality Date   ABDOMINAL HYSTERECTOMY     AV NODE ABLATION N/A 05/19/2020   Procedure: AV NODE ABLATION;  Surgeon: Constance Haw, MD;  Location: [place] [facility];  Service: Cardiovascular;  Laterality: N/A;   BIV ICD INSERTION CRT-D N/A 07/28/2019   Procedure: BIV ICD INSERTION CRT-D;  Surgeon: Constance Haw, MD;  Location: [place] [facility];  Service: Cardiovascular;  Laterality: N/A;  BREAST BIOPSY Right 08/04/2016    fibroadenoma with calcifications    BREAST BIOPSY Right 07/16/2014   fibroadenoma with calcifications and atypical   BREAST EXCISIONAL BIOPSY Right 10/06/2014   atypical lobular hyperplasia    BREAST LUMPECTOMY W/ NEEDLE LOCALIZATION Right 10/06/2014   Dr Georgette Dover   BREAST LUMPECTOMY WITH NEEDLE LOCALIZATION Right 10/06/2014   Procedure: RIGHT BREAST LUMPECTOMY WITH NEEDLE LOCALIZATION;  Surgeon: Donnie Mesa, MD;  Location: [place];  Service: General;  Laterality: Right;   CARDIOVERSION N/A 10/22/2019   Procedure: CARDIOVERSION;  Surgeon: Larey Dresser, MD;  Location: [place];  Service: Cardiovascular;  Laterality: N/A;   CLIPPING OF ATRIAL APPENDAGE N/A 10/23/2013   Procedure: CLIPPING OF ATRIAL APPENDAGE;  Surgeon: Rexene Alberts, MD;  Location: [place];  Service: Open Heart Surgery;   Laterality: N/A;   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   CORONARY ARTERY BYPASS GRAFT N/A 10/23/2013   Procedure: CORONARY ARTERY BYPASS GRAFTING (CABG);  Surgeon: Rexene Alberts, MD;  Location: [place];  Service: Open Heart Surgery: LIMA-LAD, SVG-OM2, SVG-RPL   INTRAOPERATIVE TRANSESOPHAGEAL ECHOCARDIOGRAM N/A 10/23/2013   Procedure: INTRAOPERATIVE TRANSESOPHAGEAL ECHOCARDIOGRAM;  Surgeon: Rexene Alberts, MD;  Location: [place];  Service: Open Heart Surgery;  Laterality: N/A;   LEFT HEART CATHETERIZATION WITH CORONARY ANGIOGRAM N/A 10/20/2013   Procedure: LEFT HEART CATHETERIZATION WITH CORONARY ANGIOGRAM;  Surgeon: Leonie Man, MD;  Location: [facility] CATH LAB;  Service: Cardiovascular: Distal LM ~70%, LAD - ostial 70%, prox 80, 95 & 95%; RCA prox 70%, mid 80%; minimal Cx disease   LEFT HEART CATHETERIZATION WITH CORONARY/GRAFT ANGIOGRAM N/A 09/08/2014   Procedure: LEFT HEART CATHETERIZATION WITH Beatrix Fetters;  Surgeon: Leonie Man, MD;  Location: [facility] CATH LAB: Indication: Abnormal Myoview. Occluded LAD after 70 and 80% left main. Diffuse RCA disease. Widely patent grafts to Moderately diseased artery vessels. Mild-moderate LV dysfunction and chronic A. fib.   NM MYOVIEW LTD  08/14/2014   Lexi scan: INTERMEDIATE RISK - large, severe intensity perfusion defect in anterior wall, apex and inferior apex that is partly reversible. This suggests either scar or possible hibernating myocardium. Nondeviated.-> Likely scar. No change in coronary anatomy with patent grafts.   TOTAL HIP ARTHROPLASTY  2010, 2011   right 2010, left 2011   TRANSTHORACIC ECHOCARDIOGRAM  12/2013   Mildly dilated left ventricle with mild to moderately reduced function. EF 40-45% (notable improvement from February 2015). Septal and apical hypokinesis. Mild LA dilation.   TRANSTHORACIC ECHOCARDIOGRAM  11/11/2019   EF 20% with severely dysfunction. Global HK. Unable to determine diastolic pressures. Mild RV function-with  mildly elevated PAP-RAP estimated 15 mmHg.. Moderate LA dilation, mild RA dilation.    MEDICATIONS: No current facility-administered medications for this encounter.    acetaminophen (TYLENOL) 650 MG CR tablet   albuterol (VENTOLIN HFA) 108 (90 Base) MCG/ACT inhaler   apixaban (ELIQUIS) 2.5 MG TABS tablet   Coenzyme Q10 (COQ-10) 100 MG CAPS   colchicine 0.6 MG tablet   Cranberry 250 MG TABS   Dulaglutide (TRULICITY [place])   ELDERBERRY PO   ezetimibe (ZETIA) 10 MG tablet   ferrous sulfate 325 (65 FE) MG tablet   isosorbide-hydrALAZINE (BIDIL) 20-37.5 MG tablet   levothyroxine (SYNTHROID) 112 MCG tablet   metolazone (ZAROXOLYN) 2.5 MG tablet   metoprolol succinate (TOPROL-XL) 100 MG 24 hr tablet   Multiple Vitamins-Minerals (MULTI FOR HER 50+ PO)   Multiple Vitamins-Minerals (PRESERVISION AREDS 2) CAPS   potassium chloride SA (KLOR-CON M) 20 MEQ  tablet   rosuvastatin (CRESTOR) 5 MG tablet   torsemide (DEMADEX) 20 MG tablet   umeclidinium-vilanterol (ANORO ELLIPTA) 62.5-25 MCG/INH AEPB   nitrofurantoin, macrocrystal-monohydrate, (MACROBID) 100 MG capsule    Myra Gianotti, PA-C Surgical Short Stay/Anesthesiology [facility] Phone [phone] [facility] Phone [phone] 04/12/2022 4:48 PM

## 2022-04-12 NOTE — Progress Notes (Addendum)
PCP - Dr. Brigitte Pulse  Cardiologist - Dr. Mclean/Dr. Ellyn Hack  EP- Dr. Curt Bears- orders sent  Endocrine- Denies  Pulm- Denies  Chest x-ray - Denies  EKG - 07/07/21 (E)  Stress Test - Denies  ECHO - 08/24/21 (E)  Cardiac Cath - 09/08/14 (E)  AICD- Yes- St. Jude PM-na LOOP-na  Nerve Stimulator- Denies  Dialysis- Denies  Sleep Study - Denies CPAP - Denies  LABS- 04/13/22: CBC, BMP  ASA- Denies ELIQUIS- LD- 8/14  ERAS- Yes- clears until 0800  HA1C- 10/18/20(E): 6.1 Fasting Blood Sugar - 0 Checks Blood Sugar __0___ times a day- Pt states she does not have to check her levels since her medication was changed. Pt advised to check levels prior to arriving for surgery.  Anesthesia- Yes- cardiac history  Pt denies having chest pain, sob, or fever during the pre-op phone call. All instructions explained to the pt, with a verbal understanding of the material including: as of today, stop taking all Aspirin (unless instructed by your doctor) and Other Aspirin containing products, Vitamins, Fish oils, and Herbal medications. Also stop all NSAIDS i.e. Advil, Ibuprofen, Motrin, Aleve, Anaprox, Naproxen, BC, Goody Powders, and all Supplements.   WHAT DO I DO ABOUT MY DIABETES MEDICATION?  The day of surgery, do not take other diabetes injectable Trulicity (dulaglutide).  If your CBG is greater than 220 mg/dL, inform the staff upon arrival to Short Stay.  How to Manage Your Diabetes Before and After Surgery  How do I manage my blood sugar before surgery? Check your blood sugar at least 4 times a day, starting 2 days before surgery, to make sure that the level is not too high or low. Check your blood sugar the morning of your surgery when you wake up and every 2 hours until you get to the Short Stay unit. If your blood sugar is less than 70 mg/dL, you will need to treat for low blood sugar: Do not take insulin. Treat a low blood sugar (less than 70 mg/dL) with  cup of clear juice  (cranberry or apple), 4 glucose tablets, OR glucose gel. Recheck blood sugar in 15 minutes after treatment (to make sure it is greater than 70 mg/dL). If your blood sugar is not greater than 70 mg/dL on recheck, call [phone]  for further instructions. Report your blood sugar to the short stay nurse when you get to Short Stay.  Reviewed and Endorsed by [facility] Patient Education Committee, August 2015  Pt also instructed to wear a mask and social distance if she goes out .The opportunity to ask questions was provided.

## 2022-04-13 ENCOUNTER — Ambulatory Visit (HOSPITAL_COMMUNITY)
Admission: RE | Admit: 2022-04-13 | Discharge: 2022-04-13 | Disposition: A | Discharge status: Home / Self Care | Payer: Medicare Other | Attending: Orthopaedic Surgery | Admitting: Orthopaedic Surgery

## 2022-04-13 ENCOUNTER — Encounter (HOSPITAL_COMMUNITY): Payer: Self-pay | Admitting: Orthopaedic Surgery

## 2022-04-13 ENCOUNTER — Other Ambulatory Visit: Payer: Self-pay

## 2022-04-13 ENCOUNTER — Encounter (HOSPITAL_COMMUNITY)
Admission: RE | Disposition: A | Discharge status: Home / Self Care | Payer: Self-pay | Source: Home / Self Care | Attending: Orthopaedic Surgery

## 2022-04-13 ENCOUNTER — Ambulatory Visit (HOSPITAL_BASED_OUTPATIENT_CLINIC_OR_DEPARTMENT_OTHER): Payer: Medicare Other | Admitting: Vascular Surgery

## 2022-04-13 ENCOUNTER — Ambulatory Visit (HOSPITAL_COMMUNITY): Payer: Medicare Other | Admitting: Vascular Surgery

## 2022-04-13 DIAGNOSIS — J449 Chronic obstructive pulmonary disease, unspecified: Secondary | ICD-10-CM | POA: Diagnosis not present

## 2022-04-13 DIAGNOSIS — I252 Old myocardial infarction: Secondary | ICD-10-CM | POA: Diagnosis not present

## 2022-04-13 DIAGNOSIS — E1122 Type 2 diabetes mellitus with diabetic chronic kidney disease: Secondary | ICD-10-CM | POA: Diagnosis not present

## 2022-04-13 DIAGNOSIS — Z87891 Personal history of nicotine dependence: Secondary | ICD-10-CM | POA: Insufficient documentation

## 2022-04-13 DIAGNOSIS — I4891 Unspecified atrial fibrillation: Secondary | ICD-10-CM

## 2022-04-13 DIAGNOSIS — Z7901 Long term (current) use of anticoagulants: Secondary | ICD-10-CM | POA: Diagnosis not present

## 2022-04-13 DIAGNOSIS — N189 Chronic kidney disease, unspecified: Secondary | ICD-10-CM | POA: Insufficient documentation

## 2022-04-13 DIAGNOSIS — M1990 Unspecified osteoarthritis, unspecified site: Secondary | ICD-10-CM | POA: Diagnosis not present

## 2022-04-13 DIAGNOSIS — I130 Hypertensive heart and chronic kidney disease with heart failure and stage 1 through stage 4 chronic kidney disease, or unspecified chronic kidney disease: Secondary | ICD-10-CM | POA: Insufficient documentation

## 2022-04-13 DIAGNOSIS — Z95810 Presence of automatic (implantable) cardiac defibrillator: Secondary | ICD-10-CM | POA: Insufficient documentation

## 2022-04-13 DIAGNOSIS — E039 Hypothyroidism, unspecified: Secondary | ICD-10-CM

## 2022-04-13 DIAGNOSIS — I509 Heart failure, unspecified: Secondary | ICD-10-CM | POA: Insufficient documentation

## 2022-04-13 DIAGNOSIS — I340 Nonrheumatic mitral (valve) insufficiency: Secondary | ICD-10-CM | POA: Diagnosis not present

## 2022-04-13 DIAGNOSIS — Z951 Presence of aortocoronary bypass graft: Secondary | ICD-10-CM | POA: Diagnosis not present

## 2022-04-13 DIAGNOSIS — Z6839 Body mass index (BMI) 39.0-39.9, adult: Secondary | ICD-10-CM | POA: Insufficient documentation

## 2022-04-13 DIAGNOSIS — G5601 Carpal tunnel syndrome, right upper limb: Secondary | ICD-10-CM

## 2022-04-13 DIAGNOSIS — I2510 Atherosclerotic heart disease of native coronary artery without angina pectoris: Secondary | ICD-10-CM | POA: Insufficient documentation

## 2022-04-13 HISTORY — PX: CARPAL TUNNEL RELEASE: SHX101

## 2022-04-13 HISTORY — DX: Presence of automatic (implantable) cardiac defibrillator: Z95.810

## 2022-04-13 LAB — CBC
HCT: 38 % (ref 36.0–46.0)
Hemoglobin: 12.5 g/dL (ref 12.0–15.0)
MCH: 29.7 pg (ref 26.0–34.0)
MCHC: 32.9 g/dL (ref 30.0–36.0)
MCV: 90.3 fL (ref 80.0–100.0)
Platelets: 212 10*3/uL (ref 150–400)
RBC: 4.21 MIL/uL (ref 3.87–5.11)
RDW: 14.6 % (ref 11.5–15.5)
WBC: 6.9 10*3/uL (ref 4.0–10.5)
nRBC: 0 % (ref 0.0–0.2)

## 2022-04-13 LAB — BASIC METABOLIC PANEL
Anion gap: 10 (ref 5–15)
BUN: 75 mg/dL — ABNORMAL HIGH (ref 8–23)
CO2: 31 mmol/L (ref 22–32)
Calcium: 9.9 mg/dL (ref 8.9–10.3)
Chloride: 95 mmol/L — ABNORMAL LOW (ref 98–111)
Creatinine, Ser: 2.11 mg/dL — ABNORMAL HIGH (ref 0.44–1.00)
GFR, Estimated: 23 mL/min — ABNORMAL LOW (ref >60)
Glucose, Bld: 113 mg/dL — ABNORMAL HIGH (ref 70–99)
Potassium: 3.7 mmol/L (ref 3.5–5.1)
Sodium: 136 mmol/L (ref 135–145)

## 2022-04-13 LAB — GLUCOSE, CAPILLARY
Glucose-Capillary: 114 mg/dL — ABNORMAL HIGH (ref 70–99)
Glucose-Capillary: 112 mg/dL — ABNORMAL HIGH (ref 70–99)

## 2022-04-13 SURGERY — CARPAL TUNNEL RELEASE
Anesthesia: Monitor Anesthesia Care | Site: Wrist | Laterality: Right

## 2022-04-13 MED ORDER — BUPIVACAINE HCL 0.25 % IJ SOLN
INTRAMUSCULAR | Status: DC | PRN
  Administered 2022-04-13: 15 mL

## 2022-04-13 MED ORDER — CEFAZOLIN SODIUM-DEXTROSE 2-4 GM/100ML-% IV SOLN
2.0000 g | INTRAVENOUS | Status: AC
  Administered 2022-04-13: 2 g via INTRAVENOUS
  Filled 2022-04-13: qty 100

## 2022-04-13 MED ORDER — SODIUM CHLORIDE 0.9 % IV SOLN: INTRAVENOUS | Status: DC

## 2022-04-13 MED ORDER — PROPOFOL 10 MG/ML IV BOLUS
INTRAVENOUS | Status: DC | PRN
  Administered 2022-04-13: 20 mg via INTRAVENOUS
  Administered 2022-04-13: 15 mg via INTRAVENOUS

## 2022-04-13 MED ORDER — ACETAMINOPHEN 500 MG PO TABS
1000.0000 mg | ORAL_TABLET | Freq: Once | ORAL | Status: AC
  Administered 2022-04-13: 1000 mg via ORAL
  Filled 2022-04-13: qty 2

## 2022-04-13 MED ORDER — BUPIVACAINE HCL (PF) 0.25 % IJ SOLN
INTRAMUSCULAR | Status: AC
  Filled 2022-04-13: qty 30

## 2022-04-13 MED ORDER — ORAL CARE MOUTH RINSE: 15.0000 mL | Freq: Once | OROMUCOSAL | Status: DC

## 2022-04-13 MED ORDER — HYDROCODONE-ACETAMINOPHEN 5-325 MG PO TABS: 1.0000 | ORAL_TABLET | Freq: Four times a day (QID) | ORAL | 0 refills | Status: DC | PRN

## 2022-04-13 MED ORDER — CHLORHEXIDINE GLUCONATE 0.12 % MT SOLN: 15.0000 mL | Freq: Once | OROMUCOSAL | Status: DC

## 2022-04-13 MED ORDER — 0.9 % SODIUM CHLORIDE (POUR BTL) OPTIME
TOPICAL | Status: DC | PRN
  Administered 2022-04-13: 1000 mL

## 2022-04-13 MED ORDER — CHLORHEXIDINE GLUCONATE 0.12 % MT SOLN
15.0000 mL | OROMUCOSAL | Status: AC
  Administered 2022-04-13: 15 mL via OROMUCOSAL
  Filled 2022-04-13: qty 15

## 2022-04-13 MED ORDER — LIDOCAINE HCL (PF) 1 % IJ SOLN
INTRAMUSCULAR | Status: AC
  Filled 2022-04-13: qty 30

## 2022-04-13 MED ORDER — PROPOFOL 500 MG/50ML IV EMUL
INTRAVENOUS | Status: DC | PRN
  Administered 2022-04-13: 50 ug/kg/min via INTRAVENOUS

## 2022-04-13 SURGICAL SUPPLY — 29 items
BAG COUNTER SPONGE SURGICOUNT (BAG) ×2 IMPLANT
BAG SPNG CNTER NS LX DISP (BAG) ×1
BANDAGE ACE 3X5.8 VEL STRL LF (GAUZE/BANDAGES/DRESSINGS) IMPLANT
CORD BIPOLAR FORCEPS 12FT (ELECTRODE) ×2 IMPLANT
COVER SURGICAL LIGHT HANDLE (MISCELLANEOUS) ×2 IMPLANT
CUFF TOURN SGL QUICK 18X4 (TOURNIQUET CUFF) ×2 IMPLANT
DRAPE SURG 17X23 STRL (DRAPES) IMPLANT
DRAPE U-SHAPE 47X51 STRL (DRAPES) IMPLANT
DURAPREP 26ML APPLICATOR (WOUND CARE) ×2 IMPLANT
GAUZE SPONGE 4X4 12PLY STRL (GAUZE/BANDAGES/DRESSINGS) ×2 IMPLANT
GAUZE XEROFORM 1X8 LF (GAUZE/BANDAGES/DRESSINGS) ×2 IMPLANT
GLOVE BIO SURGEON STRL SZ8 (GLOVE) ×2 IMPLANT
GLOVE BIOGEL PI IND STRL 8 (GLOVE) ×4 IMPLANT
GLOVE BIOGEL PI INDICATOR 8 (GLOVE) ×2
GLOVE ORTHO TXT STRL SZ7.5 (GLOVE) ×2 IMPLANT
GOWN STRL REUS W/ TWL LRG LVL3 (GOWN DISPOSABLE) IMPLANT
GOWN STRL REUS W/ TWL XL LVL3 (GOWN DISPOSABLE) ×4 IMPLANT
GOWN STRL REUS W/TWL LRG LVL3 (GOWN DISPOSABLE) ×2
GOWN STRL REUS W/TWL XL LVL3 (GOWN DISPOSABLE) ×1
KIT BASIN OR (CUSTOM PROCEDURE TRAY) ×2 IMPLANT
KIT TURNOVER KIT B (KITS) ×2 IMPLANT
NEEDLE 22X1 1/2 (OR ONLY) (NEEDLE) ×2 IMPLANT
NS IRRIG 1000ML POUR BTL (IV SOLUTION) ×2 IMPLANT
PACK ORTHO EXTREMITY (CUSTOM PROCEDURE TRAY) ×2 IMPLANT
PAD ARMBOARD 7.5X6 YLW CONV (MISCELLANEOUS) ×4 IMPLANT
SUT ETHILON 3 0 PS 1 (SUTURE) ×2 IMPLANT
SYR CONTROL 10ML LL (SYRINGE) ×2 IMPLANT
TOWEL GREEN STERILE (TOWEL DISPOSABLE) ×2 IMPLANT
UNDERPAD 30X36 HEAVY ABSORB (UNDERPADS AND DIAPERS) ×2 IMPLANT

## 2022-04-13 NOTE — Op Note (Signed)
[name], Elaine [name] MEDICAL RECORD NO: [id number] ACCOUNT NO: [id number] DATE OF BIRTH: [birth year] FACILITY: [facility] LOCATION: [facility]PER[facility] PHYSICIAN: Lind Guest. Ninfa Linden, MD  Operative Report   DATE OF PROCEDURE: 04/13/2022   PREOPERATIVE DIAGNOSIS:  Severe carpal tunnel syndrome, right upper extremity.  POSTOPERATIVE DIAGNOSIS:  Severe carpal tunnel syndrome, right upper extremity.  PROCEDURE:  Right open carpal tunnel release.  SURGEON:  Lind Guest. Ninfa Linden, MD  ASSISTANT:  Benita Stabile, PA-C.  ANESTHESIA:   1.  Mask ventilation, IV sedation. 2.  Local with 0.25% plain Marcaine.  TOURNIQUET TIME:  Less than 1 hour.  ESTIMATED BLOOD LOSS:  Minimal.  COMPLICATIONS:  None.  INDICATIONS:  The patient is an 81 year old female with well documented severe carpal tunnel syndrome of right upper extremity.  She has numbness and tingling as well as weakness and muscle atrophy.  We have recommended in the past, carpal tunnel  release.  She does at this point agree to proceed with it due to her worsening symptoms.  She understands she will not get full recovery based on the severity of her disease and already muscle atrophy, but hopefully this will decrease her pain and  improve her numbness.  DESCRIPTION OF PROCEDURE:  After informed consent was obtained, appropriate right hand was marked.  She was brought to the operating room and kept on the stretcher with the right arm on arm table.  A nonsterile forearm tourniquet was placed in the right  hand and wrist were prepped and draped with DuraPrep and sterile drapes.  A timeout was called and she was identified as correct patient, correct right hand and wrist.  Moderate sedation and mask ventilation was given and an Esmarch was used to wrap out  the hand and the tourniquet was inflated to 250 mm of pressure.  We then anesthetized in the palm with our plain Marcaine throughout the course of the incision.  We then made an incision over the  transverse carpal ligament and carried this proximally and  distally.  We dissected down the distal edge of transverse carpal ligament and protecting the median nerve while we slowly and meticulously divided the transverse carpal ligament from distal to proximal.  We then reviewed the median nerve and found it  to be intact as well as motor branch.  We irrigated the soft tissue with normal saline solution and closed the skin with interrupted 3-0 nylon suture.  Xeroform well-padded sterile dressing was applied.  The tourniquet was let down.  Her fingers pinked  nicely.  She was taken to recovery room in stable condition with all final counts being correct.  No complications noted.     SUJ D: 04/13/2022 12:55:32 pm T: 04/13/2022 9:49:00 pm  JOB: [id number]/ [id number]

## 2022-04-13 NOTE — Transfer of Care (Signed)
Immediate Anesthesia Transfer of Care Note  Patient: Elaine [name]  Procedure(s) Performed: RIGHT OPEN CARPAL TUNNEL RELEASE (Right: Wrist)  Patient Location: PACU  Anesthesia Type:MAC  Level of Consciousness: awake and alert   Airway & Oxygen Therapy: Patient Spontanous Breathing  Post-op Assessment: Report given to RN and Post -op Vital signs reviewed and stable  Post vital signs: Reviewed and stable  Last Vitals:  Vitals Value Taken Time  BP 126/70 04/13/22 1301  Temp    Pulse 73 04/13/22 1308  Resp 16 04/13/22 1308  SpO2 96 % 04/13/22 1308  Vitals shown include unvalidated device data.  Last Pain:  Vitals:   04/13/22 0933  TempSrc:   PainSc: 0-No pain      Patients Stated Pain Goal: 3 (85/63/14 9702)  Complications: No notable events documented.

## 2022-04-13 NOTE — H&P (Signed)
Elaine [name] is an 80 y.o. female.   Chief Complaint:   Right hand numbness and tingling HPI: The patient is an 81 year old female well-known to me.  She has severe carpal tunnel syndrome involving her right hand.  There is numbness, tingling and weakness as well as muscle atrophy.  At this point we recommended an open carpal tunnel release.  She does wish to proceed with this as well given the numbness she continues to have in her hand.  She understands also the her carpal tunnel syndrome is so severe that she will likely not get full recovery from this release.  Past Medical History:  Diagnosis Date   Acute myocardial infarction of anterior wall ([place]) 10/20/2013   Afib, LBBB   Acute pulmonary edema with congestive heart failure ([place]) 10/20/2013   Resolved   Arthritis    Atrial fibrillation ([place]) 10/20/2013   On Warfarin   CAD, multiple vessel 10/20/2013   LM, LAD & RCA --> CABG; MYOVIEW 12/'15: Intermediate Risk would large anterior, anteroapical segment the apex possible infarct and peri-infarct ischemia   CHF (congestive heart failure) (HCC)    EF 40-45%.   Chronic kidney disease    COPD (chronic obstructive pulmonary disease) (HCC)    Diabetic neuropathy (HCC)    Heart murmur    Likely related to aortic sclerosis   Hyperlipidemia    Hypertension    Hypothyroidism    Ischemic cardiomyopathy - Notable Improvement in EF post CABG 10/20/2013   Echo 2/23: EF 20-25%; mild LVH. anteroseptal Akinesis; mid-apical anterior, inferior & inferoseptal + apical-lateral akinesis; G 1 DDysfxn.; Mild-Mod LA dil; mild MR; Mod PHTN;; F/u Echo 12/2013: EF 40-45%, septal & apical HK, mild LVH; Mod LA dilation   Left bundle branch block (LBBB) on electrocardiogram    Mild mitral regurgitation by prior echocardiogram    Mild-Mod MR on Echo   Morbid obesity ([place])    OSTEOARTHRITIS 08/06/2006   S/P CABG x 3 10/23/2013   LIMA to LAD, SVG to OM2, SVG to RPLB, EVH via right thigh   Type II diabetes mellitus with  complication ([place]) 01/75/1025   off rx since heart attack 12/15-dr told could stay off if keep cbg under 150   UTI (lower urinary tract infection) 09/29/14   ongoing now. started rx 09/28/14    Past Surgical History:  Procedure Laterality Date   ABDOMINAL HYSTERECTOMY     AV NODE ABLATION N/A 05/19/2020   Procedure: AV NODE ABLATION;  Surgeon: Constance Haw, MD;  Location: [place] [facility];  Service: Cardiovascular;  Laterality: N/A;   BIV ICD INSERTION CRT-D N/A 07/28/2019   Procedure: BIV ICD INSERTION CRT-D;  Surgeon: Constance Haw, MD;  Location: [place] [facility];  Service: Cardiovascular;  Laterality: N/A;   BREAST BIOPSY Right 08/04/2016    fibroadenoma with calcifications    BREAST BIOPSY Right 07/16/2014   fibroadenoma with calcifications and atypical   BREAST EXCISIONAL BIOPSY Right 10/06/2014   atypical lobular hyperplasia    BREAST LUMPECTOMY W/ NEEDLE LOCALIZATION Right 10/06/2014   Dr Georgette Dover   BREAST LUMPECTOMY WITH NEEDLE LOCALIZATION Right 10/06/2014   Procedure: RIGHT BREAST LUMPECTOMY WITH NEEDLE LOCALIZATION;  Surgeon: Donnie Mesa, MD;  Location: [place];  Service: General;  Laterality: Right;   CARDIOVERSION N/A 10/22/2019   Procedure: CARDIOVERSION;  Surgeon: Larey Dresser, MD;  Location: [facility] ENDOSCOPY;  Service: Cardiovascular;  Laterality: N/A;   CLIPPING OF ATRIAL APPENDAGE N/A 10/23/2013   Procedure: CLIPPING OF ATRIAL  APPENDAGE;  Surgeon: Rexene Alberts, MD;  Location: [place];  Service: Open Heart Surgery;  Laterality: N/A;   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   CORONARY ARTERY BYPASS GRAFT N/A 10/23/2013   Procedure: CORONARY ARTERY BYPASS GRAFTING (CABG);  Surgeon: Rexene Alberts, MD;  Location: [place];  Service: Open Heart Surgery: LIMA-LAD, SVG-OM2, SVG-RPL   INTRAOPERATIVE TRANSESOPHAGEAL ECHOCARDIOGRAM N/A 10/23/2013   Procedure: INTRAOPERATIVE TRANSESOPHAGEAL ECHOCARDIOGRAM;  Surgeon: Rexene Alberts, MD;  Location: [place];  Service: Open Heart  Surgery;  Laterality: N/A;   LEFT HEART CATHETERIZATION WITH CORONARY ANGIOGRAM N/A 10/20/2013   Procedure: LEFT HEART CATHETERIZATION WITH CORONARY ANGIOGRAM;  Surgeon: Leonie Man, MD;  Location: [facility] CATH LAB;  Service: Cardiovascular: Distal LM ~70%, LAD - ostial 70%, prox 80, 95 & 95%; RCA prox 70%, mid 80%; minimal Cx disease   LEFT HEART CATHETERIZATION WITH CORONARY/GRAFT ANGIOGRAM N/A 09/08/2014   Procedure: LEFT HEART CATHETERIZATION WITH Beatrix Fetters;  Surgeon: Leonie Man, MD;  Location: [facility] CATH LAB: Indication: Abnormal Myoview. Occluded LAD after 70 and 80% left main. Diffuse RCA disease. Widely patent grafts to Moderately diseased artery vessels. Mild-moderate LV dysfunction and chronic A. fib.   NM MYOVIEW LTD  08/14/2014   Lexi scan: INTERMEDIATE RISK - large, severe intensity perfusion defect in anterior wall, apex and inferior apex that is partly reversible. This suggests either scar or possible hibernating myocardium. Nondeviated.-> Likely scar. No change in coronary anatomy with patent grafts.   TOTAL HIP ARTHROPLASTY  2010, 2011   right 2010, left 2011   TRANSTHORACIC ECHOCARDIOGRAM  12/2013   Mildly dilated left ventricle with mild to moderately reduced function. EF 40-45% (notable improvement from February 2015). Septal and apical hypokinesis. Mild LA dilation.   TRANSTHORACIC ECHOCARDIOGRAM  11/11/2019   EF 20% with severely dysfunction. Global HK. Unable to determine diastolic pressures. Mild RV function-with mildly elevated PAP-RAP estimated 15 mmHg.. Moderate LA dilation, mild RA dilation.    Family History  Adopted: Yes  Problem Relation Age of Onset   Other Mother    Hypertension Mother    Colon cancer Neg Hx    Esophageal cancer Neg Hx    Rectal cancer Neg Hx    Stomach cancer Neg Hx    Social History:  reports that she quit smoking about 28 years ago. Her smoking use included cigarettes. She has a 19.50 pack-year smoking history. She has never  used smokeless tobacco. She reports that she does not drink alcohol and does not use drugs.  Allergies:  Allergies  Allergen Reactions   Crestor [Rosuvastatin Calcium] Other (See Comments)    Cramps- still taking    Allopurinol Hives   Tape Rash    Adhesive tape    Medications Prior to Admission  Medication Sig Dispense Refill   acetaminophen (TYLENOL) 650 MG CR tablet Take 1,300 mg by mouth 2 (two) times daily.     albuterol (VENTOLIN HFA) 108 (90 Base) MCG/ACT inhaler Inhale 2 puffs into the lungs every 6 (six) hours as needed for wheezing or shortness of breath.     apixaban (ELIQUIS) 2.5 MG TABS tablet Take 2.5 mg by mouth 2 (two) times daily.     Coenzyme Q10 (COQ-10) 100 MG CAPS Take 100 mg by mouth in the morning and at bedtime.      colchicine 0.6 MG tablet Take 0.6 mg by mouth daily as needed (Gout).      Cranberry 250 MG TABS Take 500 mg by  mouth 2 (two) times daily.     Dulaglutide (TRULICITY ) Inject 1.5 mg into the skin once a week. Tuesday     ELDERBERRY PO Take 1 tablet by mouth 2 (two) times daily.     ezetimibe (ZETIA) 10 MG tablet Take 10 mg by mouth daily.     ferrous sulfate 325 (65 FE) MG tablet Take 325 mg by mouth every other day.     isosorbide-hydrALAZINE (BIDIL) 20-37.5 MG tablet Take 0.5 tablets by mouth 3 (three) times daily. 135 tablet 3   levothyroxine (SYNTHROID) 112 MCG tablet Take 1 tablet (112 mcg total) by mouth daily before breakfast. 30 tablet 12   metolazone (ZAROXOLYN) 2.5 MG tablet Take as directed by CHF Clinic only (Patient taking differently: Take 2.5 mg by mouth daily as needed (CHF excessive edema of over 5 Lbs.). Take as directed by CHF Clinic only) 20 tablet 2   metoprolol succinate (TOPROL-XL) 100 MG 24 hr tablet Take 1 tablet (100 mg total) by mouth every morning AND 0.5 tablets (50 mg total) every evening. Take with or immediately following a meal.. 135 tablet 3   Multiple Vitamins-Minerals (MULTI FOR HER 50+ PO) Take 1 tablet by mouth  daily.     Multiple Vitamins-Minerals (PRESERVISION AREDS 2) CAPS Take 1 capsule by mouth 2 (two) times daily.     potassium chloride SA (KLOR-CON M) 20 MEQ tablet Take 1 tablet by mouth once daily (Patient taking differently: Take 20 mEq by mouth every other day.) 90 tablet 3   rosuvastatin (CRESTOR) 5 MG tablet Take 5 mg by mouth every other day.      torsemide (DEMADEX) 20 MG tablet TAKE 3 TABLETS BY MOUTH IN THE MORNING (Patient taking differently: Take 60 mg by mouth daily.) 252 tablet 3   umeclidinium-vilanterol (ANORO ELLIPTA) 62.5-25 MCG/INH AEPB Inhale 1 puff into the lungs daily.     nitrofurantoin, macrocrystal-monohydrate, (MACROBID) 100 MG capsule Take 100 mg by mouth daily.      Results for orders placed or performed during the hospital encounter of 04/13/22 (from the past 48 hour(s))  Glucose, capillary     Status: Abnormal   Collection Time: 04/13/22  9:08 AM  Result Value Ref Range   Glucose-Capillary 114 (H) 70 - 99 mg/dL    Comment: Glucose reference range applies only to samples taken after fasting for at least 8 hours.   No results found.  Review of Systems  Blood pressure 131/66, pulse 72, temperature 97.8 F (36.6 C), temperature source Oral, resp. rate 18, height 5' 2.5" (1.588 m), weight 99.1 kg, SpO2 95 %. Physical Exam Vitals reviewed.  Constitutional:      Appearance: Normal appearance.  Cardiovascular:     Rate and Rhythm: Normal rate.     Pulses: Normal pulses.  Pulmonary:     Effort: Pulmonary effort is normal.     Breath sounds: Normal breath sounds.  Abdominal:     Palpations: Abdomen is soft.  Musculoskeletal:     Right hand: Decreased strength. Decreased sensation of the median distribution.     Cervical back: Normal range of motion and neck supple.  Neurological:     Mental Status: She is alert and oriented to person, place, and time.  Psychiatric:        Behavior: Behavior normal.      Assessment/Plan Right severe carpal tunnel  syndrome  The plan is to proceed to surgery today for a right open carpal tunnel release.  The risks and benefits  of surgery been explained in detail and informed consent is obtained.  The right operative wrist and hand have been marked.  Mcarthur Rossetti, MD 04/13/2022, 9:22 AM

## 2022-04-13 NOTE — Anesthesia Procedure Notes (Signed)
Procedure Name: MAC Date/Time: 04/13/2022 12:25 PM  Performed by: Georgia Duff, CRNAPre-anesthesia Checklist: Patient identified, Emergency Drugs available, Suction available and Patient being monitored Patient Re-evaluated:Patient Re-evaluated prior to induction Oxygen Delivery Method: Simple face mask

## 2022-04-13 NOTE — Discharge Instructions (Signed)
You may use your right operative hand as comfort allows. Keep your dressing clean and dry. Wait 5 to 6 days before removing your dressings. You can get your incision wet in the shower in 6 to 7 days daily. 6 to 7 days, place a dry dressing over your incision daily after each shower.

## 2022-04-13 NOTE — Progress Notes (Signed)
Pt has St Jude/[employer] ICD. Rep, Windle Guard notified . Dr. Christella Hartigan notified. Both Aaron Edelman and Dr. Christella Hartigan and Aaron Edelman notified of EP physician recommendations. Per Dr. Christella Hartigan ,Dr.Blackman is not using Bovie so rep does not need to be present.

## 2022-04-14 ENCOUNTER — Encounter (HOSPITAL_COMMUNITY): Payer: Self-pay | Admitting: Orthopaedic Surgery

## 2022-04-14 NOTE — Anesthesia Postprocedure Evaluation (Signed)
Anesthesia Post Note  Patient: Elaine [name]  Procedure(s) Performed: RIGHT OPEN CARPAL TUNNEL RELEASE (Right: Wrist)     Patient location during evaluation: PACU Anesthesia Type: MAC Level of consciousness: awake and alert Pain management: pain level controlled Vital Signs Assessment: post-procedure vital signs reviewed and stable Respiratory status: spontaneous breathing, nonlabored ventilation and respiratory function stable Cardiovascular status: blood pressure returned to baseline and stable Postop Assessment: no apparent nausea or vomiting Anesthetic complications: no   No notable events documented.  Last Vitals:  Vitals:   04/13/22 1330 04/13/22 1343  BP: 124/67 125/85  Pulse: 73 73  Resp: 16 14  Temp:  36.8 C  SpO2: 97% 97%    Last Pain:  Vitals:   04/13/22 1343  TempSrc:   PainSc: 0-No pain   Pain Goal: Patients Stated Pain Goal: 3 (04/12/22 1656)                 [name]

## 2022-04-17 ENCOUNTER — Ambulatory Visit (INDEPENDENT_AMBULATORY_CARE_PROVIDER_SITE_OTHER): Payer: Medicare Other

## 2022-04-17 DIAGNOSIS — I5022 Chronic systolic (congestive) heart failure: Secondary | ICD-10-CM | POA: Diagnosis not present

## 2022-04-17 DIAGNOSIS — Z95810 Presence of automatic (implantable) cardiac defibrillator: Secondary | ICD-10-CM

## 2022-04-17 NOTE — Progress Notes (Signed)
EPIC Encounter for ICM Monitoring  Patient Name: Elaine [name] is a 81 y.o. female Date: 04/17/2022 Primary Care Physican: Ginger Organ., MD Primary Cardiologist: [facility] Electrophysiologist: Cyndi Bender Pacing: 97% 01/10/2022 Weight: 219 lbs 03/15/2022 Weight: 217 lbs 04/17/2022 Weight: 224 lbs                                                            3e .Spoke with patient and heart failure questions reviewed.  Pt has some swelling in legs and weight gain of 5 lbs. .    CorVue thoracic impedance suggesting possible fluid accumulation starting 8/17 which correlates with 8/17 hand surgery but trending back toward baseline.   Prescribed:   Torsemide 20 mg take 3 tablets (60 mg total) by mouth daily.   Potassium 20 mEq take 1 tablet by mouth every other day Metolazone 2.5 mg Take only as directed by CHF clinic.   Labs: 04/14/2022 Creatinine 2.11, BUN 75, Potassium 3.7, Sodium 136, GFR 23 01/13/2022 Creatinine 2.17, BUN 62, Potassium 4.2, Sodium 139, GFR 22 12/06/2021 Creatinine 2.31, BUN 53, Potassium 3.9, Sodium 137, GFR 21 11/29/2021 Creatinine 2.18, BUN 47, Potassium 4.1, Sodium 138, GFR 22 11/22/2021 Creatinine 2.16, BUN 46, Potassium 3.7, Sodium 136, GFR 22 11/14/2021 Creatinine 2.50, BUN 52, Potassium 4.7, Sodium 138, GFR 19  11/11/2021 Creatinine 2.49, BUN 53, Potassium 5.8, Sodium 141, GFR 19  11/04/2021 Creatinine 2.32, BUN 58, Potassium 3.2, Sodium 136, GFR 21  10/26/2021 Creatinine 2.66, BUN 81, Potassium 3.2, Sodium 136, GFR 17 09/21/2021 Creatinine 2.28, BUN 37, Potassium 4.4, Sodium 136, GFR 21 09/09/2021 Creatinine 2.74, BUN 75, Potassium 4.2, Sodium 136, GFR 17 A complete set of results can be found in Results Review.   Recommendations:  Pt plans on taking 1 Metolazone tablet with 1 extra Potassium tomorrow morning before taking Torsemide.  She typically takes the Metolazone for leg swelling and weight gain.   Follow-up plan: ICM clinic phone  appointment on 04/24/2022 to recheck fluid levels.   91 day device clinic remote transmission 06/07/2022.     EP/Cardiology Office Visits:   05/02/2022 with HF clinic.   Recall 07/02/2022 with Dr Curt Bears.   Copy of ICM check sent to Dr. Curt Bears and Dr Aundra Dubin as Juluis Rainier.    3 month ICM trend: 04/17/2022.    12-14 Month ICM trend:     [name], RN 04/17/2022 1:16 PM

## 2022-04-24 ENCOUNTER — Ambulatory Visit (INDEPENDENT_AMBULATORY_CARE_PROVIDER_SITE_OTHER): Payer: Medicare Other

## 2022-04-24 DIAGNOSIS — I5022 Chronic systolic (congestive) heart failure: Secondary | ICD-10-CM

## 2022-04-24 DIAGNOSIS — Z95810 Presence of automatic (implantable) cardiac defibrillator: Secondary | ICD-10-CM

## 2022-04-26 ENCOUNTER — Ambulatory Visit (INDEPENDENT_AMBULATORY_CARE_PROVIDER_SITE_OTHER): Payer: Medicare Other | Admitting: Orthopaedic Surgery

## 2022-04-26 ENCOUNTER — Encounter: Payer: Self-pay | Admitting: Orthopaedic Surgery

## 2022-04-26 DIAGNOSIS — Z9889 Other specified postprocedural states: Secondary | ICD-10-CM

## 2022-04-26 NOTE — Progress Notes (Signed)
HPI: Ms. Eugene returns today status post right carpal tunnel release 04/13/2022.  She states she is doing well just slightly tender she does note that her tingling in her hand is improving.  Physical exam: Right hand surgical incision is healing approximately over the interrupted nylon sutures no gross signs of infection.  She can wiggle her fingers.  Radial pulses intact.  Impression: Status post right carpal tunnel release 04/13/2022  Plan: She will keep the incision clean and dry she can wash for hygiene purposes.  She will follow-up with [place] in 1 week for suture removal stating that it was too early to remove the stitches today.  Questions were encouraged and answered.

## 2022-04-26 NOTE — Progress Notes (Signed)
EPIC Encounter for ICM Monitoring  Patient Name: Elaine [name] is a 81 y.o. female Date: 04/26/2022 Primary Care Physican: Ginger Organ., MD Primary Cardiologist: [facility] Electrophysiologist: Cyndi Bender Pacing: 97% 01/10/2022 Weight: 219 lbs 03/15/2022 Weight: 217 lbs 04/17/2022 Weight: 224 lbs 04/26/2022 Weight: 228 lbs                                                            Spoke with patient and heart failure questions reviewed.  Pt reports weight remains up despite fluid levels are normal and swelling has resolved.  She does eat foods like spam which are high in salt and possbily drinks more than 64 oz daily.    CorVue thoracic impedance suggesting fluid levels returned to normal after taking PRN Metolazone.   Prescribed:   Torsemide 20 mg take 3 tablets (60 mg total) by mouth daily.   Potassium 20 mEq take 1 tablet by mouth every other day Metolazone 2.5 mg Take only as directed by CHF clinic.   Labs: 04/14/2022 Creatinine 2.11, BUN 75, Potassium 3.7, Sodium 136, GFR 23 01/13/2022 Creatinine 2.17, BUN 62, Potassium 4.2, Sodium 139, GFR 22 12/06/2021 Creatinine 2.31, BUN 53, Potassium 3.9, Sodium 137, GFR 21 11/29/2021 Creatinine 2.18, BUN 47, Potassium 4.1, Sodium 138, GFR 22 11/22/2021 Creatinine 2.16, BUN 46, Potassium 3.7, Sodium 136, GFR 22 11/14/2021 Creatinine 2.50, BUN 52, Potassium 4.7, Sodium 138, GFR 19  11/11/2021 Creatinine 2.49, BUN 53, Potassium 5.8, Sodium 141, GFR 19  11/04/2021 Creatinine 2.32, BUN 58, Potassium 3.2, Sodium 136, GFR 21  10/26/2021 Creatinine 2.66, BUN 81, Potassium 3.2, Sodium 136, GFR 17 09/21/2021 Creatinine 2.28, BUN 37, Potassium 4.4, Sodium 136, GFR 21 09/09/2021 Creatinine 2.74, BUN 75, Potassium 4.2, Sodium 136, GFR 17 A complete set of results can be found in Results Review.   Recommendations:  No changes and encouraged to call if experiencing any fluid symptoms.   Follow-up plan: ICM clinic phone appointment on  05/22/2022.   91 day device clinic remote transmission 06/07/2022.     EP/Cardiology Office Visits:   05/02/2022 with HF clinic.   Recall 07/02/2022 with Dr Curt Bears.   Copy of ICM check sent to Dr. Curt Bears.  3 month ICM trend: 04/24/2022.    12-14 Month ICM trend:     [name], RN 04/26/2022 12:38 PM

## 2022-05-02 ENCOUNTER — Ambulatory Visit (HOSPITAL_COMMUNITY)
Admission: RE | Admit: 2022-05-02 | Discharge: 2022-05-02 | Disposition: A | Discharge status: Home / Self Care | Payer: Medicare Other | Source: Ambulatory Visit | Attending: Family Medicine | Admitting: Family Medicine

## 2022-05-02 ENCOUNTER — Encounter (HOSPITAL_COMMUNITY): Payer: Self-pay

## 2022-05-02 VITALS — BP 124/66 | HR 73 | Wt 221.4 lb

## 2022-05-02 DIAGNOSIS — Z947 Corneal transplant status: Secondary | ICD-10-CM

## 2022-05-02 DIAGNOSIS — N184 Chronic kidney disease, stage 4 (severe): Secondary | ICD-10-CM | POA: Diagnosis not present

## 2022-05-02 DIAGNOSIS — Z951 Presence of aortocoronary bypass graft: Secondary | ICD-10-CM | POA: Diagnosis not present

## 2022-05-02 DIAGNOSIS — Z95810 Presence of automatic (implantable) cardiac defibrillator: Secondary | ICD-10-CM | POA: Diagnosis not present

## 2022-05-02 DIAGNOSIS — I4821 Permanent atrial fibrillation: Secondary | ICD-10-CM | POA: Diagnosis not present

## 2022-05-02 DIAGNOSIS — N390 Urinary tract infection, site not specified: Secondary | ICD-10-CM | POA: Diagnosis not present

## 2022-05-02 DIAGNOSIS — I447 Left bundle-branch block, unspecified: Secondary | ICD-10-CM | POA: Insufficient documentation

## 2022-05-02 DIAGNOSIS — J449 Chronic obstructive pulmonary disease, unspecified: Secondary | ICD-10-CM | POA: Insufficient documentation

## 2022-05-02 DIAGNOSIS — Z79899 Other long term (current) drug therapy: Secondary | ICD-10-CM | POA: Diagnosis not present

## 2022-05-02 DIAGNOSIS — I255 Ischemic cardiomyopathy: Secondary | ICD-10-CM | POA: Insufficient documentation

## 2022-05-02 DIAGNOSIS — I4819 Other persistent atrial fibrillation: Secondary | ICD-10-CM | POA: Insufficient documentation

## 2022-05-02 DIAGNOSIS — I5022 Chronic systolic (congestive) heart failure: Secondary | ICD-10-CM | POA: Insufficient documentation

## 2022-05-02 DIAGNOSIS — I2510 Atherosclerotic heart disease of native coronary artery without angina pectoris: Secondary | ICD-10-CM

## 2022-05-02 DIAGNOSIS — Z7901 Long term (current) use of anticoagulants: Secondary | ICD-10-CM | POA: Insufficient documentation

## 2022-05-02 DIAGNOSIS — E1122 Type 2 diabetes mellitus with diabetic chronic kidney disease: Secondary | ICD-10-CM | POA: Diagnosis not present

## 2022-05-02 NOTE — Patient Instructions (Signed)
It was great to see you today! No medication changes are needed at this time.   Your physician recommends that you schedule a follow-up appointment in: 3-4 months with Dr Aundra Dubin    Do the following things EVERYDAY: Weigh yourself in the morning before breakfast. Write it down and keep it in a log. Take your medicines as prescribed Eat low salt foods--Limit salt (sodium) to 2000 mg per day.  Stay as active as you can everyday Limit all fluids for the day to less than 2 liters   At the [facility], you and your health needs are our priority. As part of our continuing mission to provide you with exceptional heart care, we have created designated Provider Care Teams. These Care Teams include your primary Cardiologist (physician) and Advanced Practice Providers (APPs- Physician Assistants and Nurse Practitioners) who all work together to provide you with the care you need, when you need it.   You may see any of the following providers on your designated Care Team at your next follow up: Dr Glori Bickers Dr Loralie Champagne Dr. Roxana Hires, NP Lyda Jester, [place] [facility] [place], [place] Forestine Na, NP Audry Riles, PharmD   Please be sure to bring in all your medications bottles to every appointment.

## 2022-05-02 NOTE — Progress Notes (Signed)
ID:  Elaine [name], DOB 1941/05/06, MRN [id number]  Provider location: [facility] Type of Visit:  Heart Failure Follow up  PCP:  Marton Redwood, MD  Primary Cardiologist:  Glenetta Hew, MD EP: Dr Tomasa Blase HF Cardiologist: Dr. Aundra Dubin   HPI: Elaine [name] is a 81 y.o. with history of CAD s/p CABG, COPD, CKD stage 3, and chronic systolic systolic CHF/ischemic cardiomyopathy.  She was referred by Dr. Ellyn Hack for evaluation of CHF.  Patient was admitted in 2/15 with atrial fibrillation/RVR and chest pain.  Cath showed severe 3VD and she had CABG x 3.  Echo in 2/15 showed EF down to 20-25%. In 2018, it appears that she went back into atrial fibrillation and has remained in atrial fibrillation persistently.  DCCV was not attempted.   Most recent echo in 10/20 showed EF < 20% with normal RV.  She had a St Jude CRT-D device implanted in 11/20.  She says that she feels "90%" better with CRT.  She is followed by Dr. Hollie Salk with nephrology.   She has been in atrial fibrillation persistently, I attempted DCCV while on amiodarone but she did not convert.  Therefore, I stopped amiodarone.    Echo in 3/21 showed EF 20% with diffuse hypokinesis, mildly dilated RV with mildly decreased systolic function, PASP 49 mmHg, dilated IVC.  In 9/21, she had AV nodal ablation.    She stopped empagliflozin due to UTIs and yeast infections.   Echo 12/22 EF 20-25%, mildly decreased RV function.   In May diuretic regimen increased 80 mg one day then 60 mg daily the next. CorVue interrogation suggested fluids returned to normal.   Today she returns for HF follow up. Overall feeling fine. She is able to get around the house and complete ADLs without significant dyspnea. Uses a wheelchair when she is out of the house and has to go further distances. Denies palpitations, abnormal bleedings. CP, dizziness, edema, or PND/Orthopnea. Appetite ok. No fever or chills. Weight at home 216 pounds. Taking all  medications. Has cut back on fluid intake. Took a metolazone last week for weight gain and swelling with good results.  ECG (personally reviewed): V-paced.  Device interrogation (personally reviewed from 04/24/22): OptiVol recently up but now at baseline after metolazone dose.   Labs (2/21): K 4.5, creatinine 2.08 => 2.99, hgb 12.8 Labs (7/21): K 4, creatinine 2.3, TSH normal Labs (12/21): K 4, creatinine 2.31, BNP 671 Labs (1/22): K 4.1, creatinine 2.48  Labs (3/22): LDL 77 Labs (6/22): K 4.9, creatinine 2.26 Labs (10/22): K 4.8, creatinine 2.41 Labs (12/22): LDL 62, hgb 13.5 Labs (2/23): K 4.4, creatinine 2.28 Labs (11/11/21): K 5.8  Labs (3/23): BUN 52 creatinine 2.5 K 4.7  Labs (5/23): K 4.2, creatinine 2.17  Labs (8/23): K 3.7, creatinine 2.11, hgb 12.5  PMH: 1. CAD: s/p CABG in 2/15 with LIMA-LAD, SVG-OM2, SVG-PLV.  2. LBBB: Chronic.  3. Atrial fibrillation: Persistent since 2018.  - LA appendage clip with CABG.  4. H/o aspiration PNA 5. Type 2 diabetes 6. COPD 7. CKD stage 4 8. Chronic systolic CHF: Ischemic cardiomyopathy.  She has a St Jude BiV ICD.  - Echo (2/15): EF 20-25%.  - Echo (5/15): EF 40-45% - Echo (10/20): EF <20%, mild LVH, normal RV size and systolic function.  - Echo (3/21): EF 20% with diffuse hypokinesis, mildly dilated RV with mildly decreased systolic function, PASP 49 mmHg, dilated IVC.  - AV nodal ablation - Echo (12/22): EF  20-25%, mildly decreased RV function.   Social History   Socioeconomic History   Marital status: Widowed    Spouse name: Not on file   Number of children: Not on file   Years of education: Not on file   Highest education level: Not on file  Occupational History   Occupation: retired  Tobacco Use   Smoking status: Former    Packs/day: 0.50    Years: 39.00    Total pack years: 19.50    Types: Cigarettes    Quit date: 08/28/1993    Years since quitting: 28.6   Smokeless tobacco: Never  Vaping Use   Vaping Use: Never  used  Substance and Sexual Activity   Alcohol use: No    Comment: quit 95   Drug use: No   Sexual activity: Not on file  Other Topics Concern   Not on file  Social History Narrative   Lives with husband and son in [place].  She is currently retired.   Former smoker quit in 1995.   Social Determinants of Health   Financial Resource Strain: Medium Risk (11/25/2019)   Overall Financial Resource Strain (CARDIA)    Difficulty of Paying Living Expenses: Somewhat hard  Food Insecurity: No Food Insecurity (11/25/2019)   Hunger Vital Sign    Worried About Running Out of Food in the Last Year: Never true    Ran Out of Food in the Last Year: Never true  Transportation Needs: No Transportation Needs (11/25/2019)   PRAPARE - Hydrologist (Medical): No    Lack of Transportation (Non-Medical): No  Physical Activity: Not on file  Stress: Not on file  Social Connections: Not on file  Intimate Partner Violence: Not on file   Family History  Adopted: Yes  Problem Relation Age of Onset   Other Mother    Hypertension Mother    Colon cancer Neg Hx    Esophageal cancer Neg Hx    Rectal cancer Neg Hx    Stomach cancer Neg Hx    Current Outpatient Medications  Medication Sig Dispense Refill   acetaminophen (TYLENOL) 650 MG CR tablet Take 1,300 mg by mouth 2 (two) times daily.     albuterol (VENTOLIN HFA) 108 (90 Base) MCG/ACT inhaler Inhale 2 puffs into the lungs every 6 (six) hours as needed for wheezing or shortness of breath.     apixaban (ELIQUIS) 2.5 MG TABS tablet Take 2.5 mg by mouth 2 (two) times daily.     Coenzyme Q10 (COQ-10) 100 MG CAPS Take 100 mg by mouth in the morning and at bedtime.      colchicine 0.6 MG tablet Take 0.6 mg by mouth daily as needed (Gout).      Cranberry 250 MG TABS Take 500 mg by mouth 2 (two) times daily.     Dulaglutide (TRULICITY [place]) Inject 1.5 mg into the skin once a week. Tuesday     ELDERBERRY PO Take 1 tablet by mouth 2  (two) times daily.     ezetimibe (ZETIA) 10 MG tablet Take 10 mg by mouth daily.     ferrous sulfate 325 (65 FE) MG tablet Take 325 mg by mouth every other day.     HYDROcodone-acetaminophen (NORCO/VICODIN) 5-325 MG tablet Take 1 tablet by mouth every 6 (six) hours as needed for moderate pain. 20 tablet 0   isosorbide-hydrALAZINE (BIDIL) 20-37.5 MG tablet Take 0.5 tablets by mouth 3 (three) times daily. 135 tablet 3   levothyroxine (SYNTHROID)  112 MCG tablet Take 1 tablet (112 mcg total) by mouth daily before breakfast. 30 tablet 12   metolazone (ZAROXOLYN) 2.5 MG tablet Take as directed by CHF Clinic only (Patient taking differently: Take 2.5 mg by mouth daily as needed (CHF excessive edema of over 5 Lbs.). Take as directed by CHF Clinic only) 20 tablet 2   metoprolol succinate (TOPROL-XL) 100 MG 24 hr tablet Take 1 tablet (100 mg total) by mouth every morning AND 0.5 tablets (50 mg total) every evening. Take with or immediately following a meal.. 135 tablet 3   Multiple Vitamins-Minerals (MULTI FOR HER 50+ PO) Take 1 tablet by mouth daily.     Multiple Vitamins-Minerals (PRESERVISION AREDS 2) CAPS Take 1 capsule by mouth 2 (two) times daily.     nitrofurantoin, macrocrystal-monohydrate, (MACROBID) 100 MG capsule Take 100 mg by mouth daily.     potassium chloride SA (KLOR-CON M) 20 MEQ tablet Take 20 mEq by mouth every other day.     rosuvastatin (CRESTOR) 5 MG tablet Take 5 mg by mouth every other day.      torsemide (DEMADEX) 20 MG tablet TAKE 3 TABLETS BY MOUTH IN THE MORNING (Patient taking differently: Take 60 mg by mouth daily.) 252 tablet 3   umeclidinium-vilanterol (ANORO ELLIPTA) 62.5-25 MCG/INH AEPB Inhale 1 puff into the lungs daily.     No current facility-administered medications for this encounter.   Wt Readings from Last 3 Encounters:  05/02/22 100.4 kg (221 lb 6.4 oz)  04/13/22 99.1 kg (218 lb 8 oz)  01/27/22 100.9 kg (222 lb 6.4 oz)   Physical Exam: General:  NAD. No resp  difficulty, arrived in [facility] HEENT: Normal Neck: Supple. No JVD. Carotids 2+ bilat; no bruits. No lymphadenopathy or thryomegaly appreciated. Cor: PMI nondisplaced. Regular rate & rhythm. No rubs, gallops or murmurs. Lungs: Clear Abdomen: Soft, obese, nontender, nondistended. No hepatosplenomegaly. No bruits or masses. Good bowel sounds. Extremities: No cyanosis, clubbing, rash, trace pedal edema Neuro: Alert & oriented x 3, cranial nerves grossly intact. Moves all 4 extremities w/o difficulty. Affect pleasant.  Assessment/Plan: 1.Chronic systolic CHF: Ischemic cardiomyopathy.  St Jude CRT-D device, now s/p AV nodal ablation.  Echo in 3/21 showed EF 20% with diffuse hypokinesis, mildly dilated RV with mildly decreased systolic function, PASP 49 mmHg, dilated IVC.  Echo 12/22 showed EF 20-25%, mildly decreased RV function.   Chronically NYHA III. She is not volume overloaded on exam. - Continue torsemide 60 mg daily, can use metolazone/extra 40 KCL PRN. She has good handle on when to take extra metolazone.  - Continue KCL 20 mEq daily. - She was unable to tolerate Bidil 1 tab tid, but she does fine with Bidil 1/2 tab tid, so will continue.  - Continue Toprol XL 100 qam/50 qpm.   - Off empagliflozin due to yeast infection and UTI.  - Off spironolactone with side effects.  2. CAD: S/p CABG in 2015. No chest pain. - No ASA given apixaban use.  - Continue Crestor 5 daily + Zetia, good lipids 12/22. 3. Atrial fibrillation: Chronic now, looks like it has gone on since 2018.  Unable to cardiovert her on amiodarone so it was stopped.  At this point Afib chronic. She is now s/p AVN ablation to allow more BiV pacing. Rate controlled.   - Continue apixaban 2.5 mg bid. No bleeding issues.  4. CKD: Stage 4: SCr baseline 2.3-2.5. Labs from 04/13/22 reviewed and stable. K 3.7, SCr 2.11  Follow up 3-4 months with  Dr. Aundra Dubin.  Signed, Rafael Bihari, FNP  05/02/2022  [facility] [address] [facility] [place] [address] [phone] (office) [phone] (fax)

## 2022-05-15 ENCOUNTER — Ambulatory Visit (INDEPENDENT_AMBULATORY_CARE_PROVIDER_SITE_OTHER): Payer: Medicare Other | Admitting: Orthopaedic Surgery

## 2022-05-15 ENCOUNTER — Encounter: Payer: Self-pay | Admitting: Orthopaedic Surgery

## 2022-05-15 DIAGNOSIS — Z9889 Other specified postprocedural states: Secondary | ICD-10-CM

## 2022-05-15 NOTE — Progress Notes (Signed)
The patient is here at a month out from a right open carpal tunnel release.  There was a scheduling mishap and she did not get back after first postoperative visit.  At that first visit we want to keep the sutures in for 1 week.  There was then a holiday.  She is doing well overall.  Her carpal tunnel syndrome was very severe and she knows that she will likely not get full relief of her symptoms.  She is feeling better overall.  The sutures been removed.  Her incision looks good with her right hand.  There is obvious muscle atrophy.  Follow-up can be as needed at this standpoint.  All question concerns were answered and addressed.  She is happy overall.

## 2022-05-19 ENCOUNTER — Other Ambulatory Visit: Payer: Self-pay

## 2022-05-19 ENCOUNTER — Emergency Department (HOSPITAL_COMMUNITY): Payer: Medicare Other

## 2022-05-19 ENCOUNTER — Emergency Department (HOSPITAL_COMMUNITY)
Admission: EM | Admit: 2022-05-19 | Discharge: 2022-05-19 | Disposition: A | Discharge status: Home / Self Care | Payer: Medicare Other | Attending: Emergency Medicine | Admitting: Emergency Medicine

## 2022-05-19 ENCOUNTER — Encounter (HOSPITAL_COMMUNITY): Payer: Self-pay

## 2022-05-19 DIAGNOSIS — E860 Dehydration: Secondary | ICD-10-CM | POA: Diagnosis not present

## 2022-05-19 DIAGNOSIS — Z79899 Other long term (current) drug therapy: Secondary | ICD-10-CM | POA: Insufficient documentation

## 2022-05-19 DIAGNOSIS — Z7901 Long term (current) use of anticoagulants: Secondary | ICD-10-CM | POA: Insufficient documentation

## 2022-05-19 DIAGNOSIS — I5020 Unspecified systolic (congestive) heart failure: Secondary | ICD-10-CM | POA: Diagnosis not present

## 2022-05-19 DIAGNOSIS — I130 Hypertensive heart and chronic kidney disease with heart failure and stage 1 through stage 4 chronic kidney disease, or unspecified chronic kidney disease: Secondary | ICD-10-CM | POA: Diagnosis not present

## 2022-05-19 DIAGNOSIS — R109 Unspecified abdominal pain: Secondary | ICD-10-CM | POA: Diagnosis present

## 2022-05-19 DIAGNOSIS — R110 Nausea: Secondary | ICD-10-CM | POA: Insufficient documentation

## 2022-05-19 DIAGNOSIS — J449 Chronic obstructive pulmonary disease, unspecified: Secondary | ICD-10-CM | POA: Diagnosis not present

## 2022-05-19 DIAGNOSIS — E871 Hypo-osmolality and hyponatremia: Secondary | ICD-10-CM | POA: Diagnosis not present

## 2022-05-19 DIAGNOSIS — E1122 Type 2 diabetes mellitus with diabetic chronic kidney disease: Secondary | ICD-10-CM | POA: Insufficient documentation

## 2022-05-19 DIAGNOSIS — N390 Urinary tract infection, site not specified: Secondary | ICD-10-CM

## 2022-05-19 DIAGNOSIS — N184 Chronic kidney disease, stage 4 (severe): Secondary | ICD-10-CM | POA: Diagnosis not present

## 2022-05-19 DIAGNOSIS — E039 Hypothyroidism, unspecified: Secondary | ICD-10-CM | POA: Insufficient documentation

## 2022-05-19 DIAGNOSIS — I2510 Atherosclerotic heart disease of native coronary artery without angina pectoris: Secondary | ICD-10-CM | POA: Diagnosis not present

## 2022-05-19 DIAGNOSIS — Z7984 Long term (current) use of oral hypoglycemic drugs: Secondary | ICD-10-CM | POA: Diagnosis not present

## 2022-05-19 LAB — URINALYSIS, MICROSCOPIC (REFLEX)

## 2022-05-19 LAB — URINALYSIS, ROUTINE W REFLEX MICROSCOPIC
Bilirubin Urine: NEGATIVE
Glucose, UA: NEGATIVE mg/dL
Ketones, ur: NEGATIVE mg/dL
Nitrite: NEGATIVE
Protein, ur: 30 mg/dL — AB
Specific Gravity, Urine: <1.005 — ABNORMAL LOW (ref 1.005–1.030)
pH: 6 (ref 5.0–8.0)

## 2022-05-19 LAB — CBC WITH DIFFERENTIAL/PLATELET
Abs Immature Granulocytes: 0.22 10*3/uL — ABNORMAL HIGH (ref 0.00–0.07)
Basophils Absolute: 0 10*3/uL (ref 0.0–0.1)
Basophils Relative: 0 %
Eosinophils Absolute: 0.1 10*3/uL (ref 0.0–0.5)
Eosinophils Relative: 1 %
HCT: 32.8 % — ABNORMAL LOW (ref 36.0–46.0)
Hemoglobin: 11.2 g/dL — ABNORMAL LOW (ref 12.0–15.0)
Immature Granulocytes: 2 %
Lymphocytes Relative: 11 %
Lymphs Abs: 1.1 10*3/uL (ref 0.7–4.0)
MCH: 29.1 pg (ref 26.0–34.0)
MCHC: 34.1 g/dL (ref 30.0–36.0)
MCV: 85.2 fL (ref 80.0–100.0)
Monocytes Absolute: 0.9 10*3/uL (ref 0.1–1.0)
Monocytes Relative: 9 %
Neutro Abs: 7.8 10*3/uL — ABNORMAL HIGH (ref 1.7–7.7)
Neutrophils Relative %: 77 %
Platelets: 177 10*3/uL (ref 150–400)
RBC: 3.85 MIL/uL — ABNORMAL LOW (ref 3.87–5.11)
RDW: 14.3 % (ref 11.5–15.5)
WBC: 10.1 10*3/uL (ref 4.0–10.5)
nRBC: 0 % (ref 0.0–0.2)

## 2022-05-19 LAB — COMPREHENSIVE METABOLIC PANEL
ALT: 41 U/L (ref 0–44)
AST: 75 U/L — ABNORMAL HIGH (ref 15–41)
Albumin: 2.7 g/dL — ABNORMAL LOW (ref 3.5–5.0)
Alkaline Phosphatase: 111 U/L (ref 38–126)
Anion gap: 14 (ref 5–15)
BUN: 77 mg/dL — ABNORMAL HIGH (ref 8–23)
CO2: 25 mmol/L (ref 22–32)
Calcium: 9.1 mg/dL (ref 8.9–10.3)
Chloride: 92 mmol/L — ABNORMAL LOW (ref 98–111)
Creatinine, Ser: 2.07 mg/dL — ABNORMAL HIGH (ref 0.44–1.00)
GFR, Estimated: 24 mL/min — ABNORMAL LOW (ref >60)
Glucose, Bld: 183 mg/dL — ABNORMAL HIGH (ref 70–99)
Potassium: 4 mmol/L (ref 3.5–5.1)
Sodium: 131 mmol/L — ABNORMAL LOW (ref 135–145)
Total Bilirubin: 3.4 mg/dL — ABNORMAL HIGH (ref 0.3–1.2)
Total Protein: 6.6 g/dL (ref 6.5–8.1)

## 2022-05-19 LAB — LACTIC ACID, PLASMA
Lactic Acid, Venous: 2.3 mmol/L (ref 0.5–1.9)
Lactic Acid, Venous: 1.5 mmol/L (ref 0.5–1.9)

## 2022-05-19 MED ORDER — CEFPODOXIME PROXETIL 100 MG PO TABS: 100.0000 mg | ORAL_TABLET | Freq: Two times a day (BID) | ORAL | 0 refills | Status: AC

## 2022-05-19 MED ORDER — SODIUM CHLORIDE 0.9 % IV BOLUS
500.0000 mL | Freq: Once | INTRAVENOUS | Status: AC
  Administered 2022-05-19: 500 mL via INTRAVENOUS

## 2022-05-19 MED ORDER — SODIUM CHLORIDE 0.9 % IV SOLN
1.0000 g | Freq: Once | INTRAVENOUS | Status: AC
  Administered 2022-05-19: 1 g via INTRAVENOUS
  Filled 2022-05-19: qty 10

## 2022-05-19 NOTE — ED Provider Notes (Signed)
[facility] EMERGENCY DEPARTMENT Provider Note   CSN: [id number] Arrival date & time: 05/19/22  1333     History  No chief complaint on file.   Elaine [name] is a 81 y.o. female.  Patient is a 81 year old female with a history of hypertension, hypothyroidism, hyperlipidemia, ischemic cardiomyopathy, CHF, atrial fibrillation, diabetes, COPD, MI and stage IV chronic kidney disease who is presenting today with complaints of fever, dysuria and flank pain.  Patient reports symptoms started on Sunday when she had a fever up to almost 102 with right-sided flank pain and dysuria.  She contacted her doctor on Monday but was unable to get a hold of them and contacted her PCP on Tuesday.  They called in a prescription for Macrobid Tuesday afternoon which she took the first dose.  Since that time she has been taking the antibiotic twice daily but reports she still having fever it was 101 this morning with still some dysuria and ongoing flank pain.  She has had no appetite and some nausea but denies any vomiting.  She has tried to continue drinking lots of water.  She denies any new shortness of breath, cough or chest pain.  She denies any abdominal pain.  No numbness or tingling in her legs.  She does generally feel weak.  The history is provided by the patient.       Home Medications Prior to Admission medications   Medication Sig Start Date End Date Taking? Authorizing Provider  acetaminophen (TYLENOL) 650 MG CR tablet Take 1,300 mg by mouth 2 (two) times daily.    [provider]  albuterol (VENTOLIN HFA) 108 (90 Base) MCG/ACT inhaler Inhale 2 puffs into the lungs every 6 (six) hours as needed for wheezing or shortness of breath.    [provider]  apixaban (ELIQUIS) 2.5 MG TABS tablet Take 2.5 mg by mouth 2 (two) times daily.    [provider]  Coenzyme Q10 (COQ-10) 100 MG CAPS Take 100 mg by mouth in the morning and at bedtime.     [provider]  colchicine 0.6 MG tablet Take 0.6 mg by mouth daily as needed (Gout).     [provider]  Cranberry 250 MG TABS Take 500 mg by mouth 2 (two) times daily.    [provider]  Dulaglutide (TRULICITY [place]) Inject 1.5 mg into the skin once a week. Tuesday    [provider]  ELDERBERRY PO Take 1 tablet by mouth 2 (two) times daily.    [provider]  ezetimibe (ZETIA) 10 MG tablet Take 10 mg by mouth daily.    [provider]  ferrous sulfate 325 (65 FE) MG tablet Take 325 mg by mouth every other day.    [provider]  HYDROcodone-acetaminophen (NORCO/VICODIN) 5-325 MG tablet Take 1 tablet by mouth every 6 (six) hours as needed for moderate pain. 04/13/22 04/13/23  Mcarthur Rossetti, MD  isosorbide-hydrALAZINE (BIDIL) 20-37.5 MG tablet Take 0.5 tablets by mouth 3 (three) times daily. 12/15/21   Larey Dresser, MD  levothyroxine (SYNTHROID) 112 MCG tablet Take 1 tablet (112 mcg total) by mouth daily before breakfast. 02/05/14   Donnajean Lopes, MD  metolazone (ZAROXOLYN) 2.5 MG tablet Take as directed by CHF Clinic only Patient taking differently: Take 2.5 mg by mouth daily as needed (CHF excessive edema of over 5 Lbs.). Take as directed by CHF Clinic only 01/27/22   Larey Dresser, MD  metoprolol succinate (TOPROL-XL)  100 MG 24 hr tablet Take 1 tablet (100 mg total) by mouth every morning AND 0.5 tablets (50 mg total) every evening. Take with or immediately following a meal.. 01/27/22   Larey Dresser, MD  Multiple Vitamins-Minerals (MULTI FOR HER 50+ PO) Take 1 tablet by mouth daily.    [provider]  Multiple Vitamins-Minerals (PRESERVISION AREDS 2) CAPS Take 1 capsule by mouth 2 (two) times daily.    [provider]  nitrofurantoin, macrocrystal-monohydrate, (MACROBID) 100 MG capsule Take 100 mg by mouth daily. 04/06/22   [provider]  potassium chloride SA (KLOR-CON M) 20 MEQ tablet Take 20  mEq by mouth every other day.    [provider]  rosuvastatin (CRESTOR) 5 MG tablet Take 5 mg by mouth every other day.     [provider]  torsemide (DEMADEX) 20 MG tablet TAKE 3 TABLETS BY MOUTH IN THE MORNING Patient taking differently: Take 60 mg by mouth daily. 02/16/22   Larey Dresser, MD  umeclidinium-vilanterol [facility] ELLIPTA) 62.5-25 MCG/INH AEPB Inhale 1 puff into the lungs daily.    [provider]      Allergies    Crestor [rosuvastatin calcium], Allopurinol, and Tape    Review of Systems   Review of Systems  Physical Exam Updated Vital Signs SpO2 97%  Physical Exam Vitals and nursing note reviewed.  Constitutional:      General: She is not in acute distress.    Appearance: She is well-developed.  HENT:     Head: Normocephalic and atraumatic.  Eyes:     Pupils: Pupils are equal, round, and reactive to light.  Cardiovascular:     Rate and Rhythm: Normal rate and regular rhythm.     Heart sounds: Normal heart sounds. No murmur heard.    No friction rub.  Pulmonary:     Effort: Pulmonary effort is normal.     Breath sounds: Normal breath sounds. No wheezing or rales.  Abdominal:     General: Bowel sounds are normal. There is no distension.     Palpations: Abdomen is soft.     Tenderness: There is no abdominal tenderness. There is right CVA tenderness. There is no guarding or rebound.  Musculoskeletal:        General: No tenderness. Normal range of motion.     Right lower leg: No edema.     Left lower leg: No edema.     Comments: No edema  Skin:    General: Skin is warm and dry.     Findings: No rash.  Neurological:     Mental Status: She is alert and oriented to person, place, and time. Mental status is at baseline.     Cranial Nerves: No cranial nerve deficit.  Psychiatric:        Behavior: Behavior normal.     ED Results / Procedures / Treatments   Labs (all labs ordered are listed, but only abnormal results are  displayed) Labs Reviewed  URINE CULTURE  CBC WITH DIFFERENTIAL/PLATELET  COMPREHENSIVE METABOLIC PANEL  URINALYSIS, ROUTINE W REFLEX MICROSCOPIC  LACTIC ACID, PLASMA  LACTIC ACID, PLASMA    EKG None  Radiology No results found.  Procedures Procedures    Medications Ordered in ED Medications - No data to display  ED Course/ Medical Decision Making/ A&P                           Medical Decision Making Amount  and/or Complexity of Data Reviewed Labs: ordered. Radiology: ordered.   Pt with multiple medical problems and comorbidities and presenting today with a complaint that caries a high risk for morbidity and mortality.  Here today with signs concerning for possible sepsis related to UTI and possible pyelonephritis.  Also concern for possible infected kidney stone.  She has no abdominal pain to suggest diverticulitis, perforation, abdominal abscess.  She has no respiratory symptoms to suggest acute chest pathology such as pneumonia.  Patient does wear chronic oxygen at home and on her baseline setting she is 97%.  Undifferentiated sepsis was initiated.  CT to evaluate for stones or pyelonephritis was also ordered.         Final Clinical Impression(s) / ED Diagnoses Final diagnoses:  None    Rx / DC Orders ED Discharge Orders     None         Blanchie Dessert, MD 05/19/22 1544

## 2022-05-19 NOTE — ED Provider Notes (Signed)
Patient signed out to me at 1500 by Dr. Maryan Rued pending labs.  In short this is an 81 year old female with a past medical history of CHF with EF of 20%, A-fib, diabetes, COPD, CAD and CKD who presented to the emergency department with fever, dysuria and flank pain after recently being treated with Macrobid.  Patient had CT scan performed that showed no acute disease.  Her labs show mild dehydration with mild hyponatremia and a lactate of 2.3.  She will be gently fluid resuscitated with a 500 cc bolus.  Urine is pending at this time.  Is awake and alert well-appearing without acute complaints.  Abdomen is soft and nontender.   Ottie Glazier, DO 05/19/22 1940

## 2022-05-19 NOTE — ED Notes (Signed)
The pt is still trying to get someone to take her home  she is calling people  to try to get a ride

## 2022-05-19 NOTE — ED Triage Notes (Signed)
Pt arrives via EMS. Pt currently taking course of abx for uti. EMS was called for generalized weakness and sob since yesterday.

## 2022-05-19 NOTE — ED Notes (Signed)
Patient resting in bed, no s/s of distress, will continue to monitor.  

## 2022-05-19 NOTE — ED Notes (Signed)
The pt has a discharge but she has to stay in the room because she is waiting for someone to call her back for a ride home.  On that phone

## 2022-05-19 NOTE — ED Notes (Signed)
Iv in her lt arm removed

## 2022-05-19 NOTE — ED Notes (Signed)
Patient resting in bed, no s/s of distress, denies pain, states is comfortable and warm enough, currently watching TV. Will continue to monitor.

## 2022-05-19 NOTE — Discharge Instructions (Addendum)
You were seen in the emergency department for your abdominal pain.  It appears that you still have a urinary tract infection.  You had no signs of any kidney infection or complications from your infection.  We started you on a different antibiotic and you should complete this as prescribed.  We did send a culture of your urine so if it grows resistant to the antibiotic that we prescribed you will receive a call and have a new antibiotic sent to your pharmacy.  You should return to the emergency department if you have fevers, repetitive vomiting, significantly worsening pain, or if you have any other new or concerning symptoms.

## 2022-05-19 NOTE — ED Notes (Signed)
CRITICAL VALUE STICKER  CRITICAL VALUE:Lactic acid 2.3  RECEIVER (on-site recipient of call):Elaine [name]  DATE & TIME NOTIFIED: 05/19/22 1616  MESSENGER (representative from lab):lab  MD NOTIFIED: Ernesto Rutherford  TIME OF NOTIFICATION:1617  RESPONSE:

## 2022-05-21 LAB — URINE CULTURE: Culture: >=100000 — AB

## 2022-05-22 ENCOUNTER — Ambulatory Visit (INDEPENDENT_AMBULATORY_CARE_PROVIDER_SITE_OTHER): Payer: Medicare Other

## 2022-05-22 ENCOUNTER — Telehealth (HOSPITAL_BASED_OUTPATIENT_CLINIC_OR_DEPARTMENT_OTHER): Payer: Self-pay | Admitting: *Deleted

## 2022-05-22 DIAGNOSIS — I5022 Chronic systolic (congestive) heart failure: Secondary | ICD-10-CM | POA: Diagnosis not present

## 2022-05-22 DIAGNOSIS — Z95810 Presence of automatic (implantable) cardiac defibrillator: Secondary | ICD-10-CM

## 2022-05-22 NOTE — Progress Notes (Signed)
ED Antimicrobial Stewardship Positive Culture Follow Up   TYLIAH SCHLERETH is an 81 y.o. female who presented to [facility] on 05/19/2022 with a chief complaint of  Chief Complaint  Patient presents with   Urinary Tract Infection   Shortness of Breath   Fatigue    Recent Results (from the past 720 hour(s))  Urine Culture     Status: Abnormal   Collection Time: 05/19/22  2:17 PM   Specimen: Urine, Clean Catch  Result Value Ref Range Status   Specimen Description URINE, CLEAN CATCH  Final   Special Requests   Final    NONE Performed at [facility], [place] [address], [place], [place] [address]    Culture >=100,000 COLONIES/mL ENTEROCOCCUS FAECALIS (A)  Final   Report Status 05/21/2022 FINAL  Final   Organism ID, Bacteria ENTEROCOCCUS FAECALIS (A)  Final      Susceptibility   Enterococcus faecalis - MIC*    AMPICILLIN <=2 SENSITIVE Sensitive     NITROFURANTOIN <=16 SENSITIVE Sensitive     VANCOMYCIN 1 SENSITIVE Sensitive     * >=100,000 COLONIES/mL ENTEROCOCCUS FAECALIS  27 yoF wo presented with complaints of fever dysuria, and flank pain. Was previously treated for UTI with macrobid, but had continued symptoms. CT showed no stones and urine culture grew enterococcus faecalis and patient sent home on cefpodoxime.  Plan: -Start new Abx: amoxicillin 1g TID x7d  ED Provider: [facility] 05/22/2022, 11:43 AM Clinical Pharmacist Monday - Friday phone -  [phone] Saturday - Sunday phone - [phone]

## 2022-05-23 NOTE — Telephone Encounter (Signed)
Post ED Visit - Positive Culture Follow-up: Unsuccessful Patient Follow-up  Culture assessed and recommendations reviewed by:  '[]'$  Elenor Quinones, Pharm.D. '[]'$  Heide Guile, Pharm.D., BCPS AQ-ID '[]'$  Parks Neptune, Pharm.D., BCPS '[]'$  Alycia Rossetti, Pharm.D., BCPS '[]'$  [place], Pharm.D., BCPS, AAHIVP '[]'$  Legrand Como, Pharm.D., BCPS, AAHIVP '[]'$  Wynell Balloon, PharmD '[]'$  Vincenza Hews, PharmD, BCPS  Positive urine culture  '[]'$  Patient discharged without antimicrobial prescription and treatment is now indicated '[]'$  Organism is resistant to prescribed ED discharge antimicrobial '[]'$  Patient with positive blood cultures  Plan:  Amox 1 gram PO TID x 7 days, Madison Redwine PA  Unable to contact patient after 3 attempts, letter will be sent to address on file  Ardeen Fillers 05/23/2022, 9:54 AM

## 2022-05-24 ENCOUNTER — Telehealth: Payer: Self-pay

## 2022-05-24 NOTE — Telephone Encounter (Signed)
I called the patient  for missed ICM  transmission with [facility] tech support. The patient cannot set app up on her own. I asked her to come to the office tomorrow and I will help her .

## 2022-05-25 ENCOUNTER — Telehealth: Payer: Self-pay | Admitting: Orthopaedic Surgery

## 2022-05-25 NOTE — Telephone Encounter (Signed)
Please advise 

## 2022-05-25 NOTE — Telephone Encounter (Signed)
Patient states her hands are swollen and and tight and wants to know if this is part of the healing process. [phone]

## 2022-05-25 NOTE — Progress Notes (Signed)
EPIC Encounter for ICM Monitoring  Patient Name: Elaine [name] is a 81 y.o. female Date: 05/25/2022 Primary Care Physican: Ginger Organ., MD Primary Cardiologist: [place] Electrophysiologist: Cyndi Bender Pacing: 97% 01/10/2022 Weight: 219 lbs 03/15/2022 Weight: 217 lbs 04/17/2022 Weight: 224 lbs 04/26/2022 Weight: 228 lbs                                                            Spoke with patient and heart failure questions reviewed.  Pt reports she was in the hospital last week due to kidney infection last week.     CorVue thoracic impedance normal but was suggesting possible fluid accumulation from 9/11-9/17 and 9/22-9/25   Prescribed:   Torsemide 20 mg take 3 tablets (60 mg total) by mouth daily.   Potassium 20 mEq take 1 tablet by mouth every other day Metolazone 2.5 mg Take only as directed by CHF clinic.   Labs: 04/14/2022 Creatinine 2.11, BUN 75, Potassium 3.7, Sodium 136, GFR 23 01/13/2022 Creatinine 2.17, BUN 62, Potassium 4.2, Sodium 139, GFR 22 12/06/2021 Creatinine 2.31, BUN 53, Potassium 3.9, Sodium 137, GFR 21 11/29/2021 Creatinine 2.18, BUN 47, Potassium 4.1, Sodium 138, GFR 22 11/22/2021 Creatinine 2.16, BUN 46, Potassium 3.7, Sodium 136, GFR 22 11/14/2021 Creatinine 2.50, BUN 52, Potassium 4.7, Sodium 138, GFR 19  11/11/2021 Creatinine 2.49, BUN 53, Potassium 5.8, Sodium 141, GFR 19  11/04/2021 Creatinine 2.32, BUN 58, Potassium 3.2, Sodium 136, GFR 21  10/26/2021 Creatinine 2.66, BUN 81, Potassium 3.2, Sodium 136, GFR 17 09/21/2021 Creatinine 2.28, BUN 37, Potassium 4.4, Sodium 136, GFR 21 09/09/2021 Creatinine 2.74, BUN 75, Potassium 4.2, Sodium 136, GFR 17 A complete set of results can be found in Results Review.   Recommendations:  No changes and encouraged to call if experiencing any fluid symptoms.   Follow-up plan: ICM clinic phone appointment on 06/26/2022.   91 day device clinic remote transmission 06/07/2022.     EP/Cardiology Office  Visits:   08/09/2022 with HF clinic.   Recall 07/02/2022 with Dr Curt Bears.   Copy of ICM check sent to Dr. Curt Bears.   3 month ICM trend: 05/25/2022.    12-14 Month ICM trend:     [name], RN 05/25/2022 5:34 PM

## 2022-05-26 NOTE — Telephone Encounter (Signed)
I advised pt that this is normal. Advised to elevate, ice and use voltaren gel. She stated understanding

## 2022-06-02 ENCOUNTER — Inpatient Hospital Stay (HOSPITAL_COMMUNITY)
Admission: EM | Admit: 2022-06-02 | Discharge: 2022-06-10 | DRG: 291 | Disposition: A | Discharge status: Skilled Nursing Facility | Payer: Medicare Other | Attending: Internal Medicine | Admitting: Internal Medicine

## 2022-06-02 ENCOUNTER — Encounter (HOSPITAL_COMMUNITY): Payer: Self-pay | Admitting: Emergency Medicine

## 2022-06-02 ENCOUNTER — Other Ambulatory Visit: Payer: Self-pay

## 2022-06-02 DIAGNOSIS — B952 Enterococcus as the cause of diseases classified elsewhere: Secondary | ICD-10-CM | POA: Diagnosis present

## 2022-06-02 DIAGNOSIS — I10 Essential (primary) hypertension: Secondary | ICD-10-CM | POA: Diagnosis present

## 2022-06-02 DIAGNOSIS — Z947 Corneal transplant status: Secondary | ICD-10-CM

## 2022-06-02 DIAGNOSIS — E785 Hyperlipidemia, unspecified: Secondary | ICD-10-CM | POA: Diagnosis present

## 2022-06-02 DIAGNOSIS — I130 Hypertensive heart and chronic kidney disease with heart failure and stage 1 through stage 4 chronic kidney disease, or unspecified chronic kidney disease: Principal | ICD-10-CM | POA: Diagnosis present

## 2022-06-02 DIAGNOSIS — Z90710 Acquired absence of both cervix and uterus: Secondary | ICD-10-CM

## 2022-06-02 DIAGNOSIS — Z87891 Personal history of nicotine dependence: Secondary | ICD-10-CM

## 2022-06-02 DIAGNOSIS — I5023 Acute on chronic systolic (congestive) heart failure: Secondary | ICD-10-CM | POA: Diagnosis present

## 2022-06-02 DIAGNOSIS — M1990 Unspecified osteoarthritis, unspecified site: Secondary | ICD-10-CM | POA: Diagnosis present

## 2022-06-02 DIAGNOSIS — Z9981 Dependence on supplemental oxygen: Secondary | ICD-10-CM

## 2022-06-02 DIAGNOSIS — R531 Weakness: Principal | ICD-10-CM

## 2022-06-02 DIAGNOSIS — Z87440 Personal history of urinary (tract) infections: Secondary | ICD-10-CM

## 2022-06-02 DIAGNOSIS — E876 Hypokalemia: Secondary | ICD-10-CM | POA: Diagnosis present

## 2022-06-02 DIAGNOSIS — E039 Hypothyroidism, unspecified: Secondary | ICD-10-CM | POA: Diagnosis present

## 2022-06-02 DIAGNOSIS — J9611 Chronic respiratory failure with hypoxia: Secondary | ICD-10-CM | POA: Diagnosis present

## 2022-06-02 DIAGNOSIS — I330 Acute and subacute infective endocarditis: Secondary | ICD-10-CM | POA: Diagnosis present

## 2022-06-02 DIAGNOSIS — Z91048 Other nonmedicinal substance allergy status: Secondary | ICD-10-CM

## 2022-06-02 DIAGNOSIS — E1122 Type 2 diabetes mellitus with diabetic chronic kidney disease: Secondary | ICD-10-CM | POA: Diagnosis present

## 2022-06-02 DIAGNOSIS — N390 Urinary tract infection, site not specified: Secondary | ICD-10-CM | POA: Diagnosis present

## 2022-06-02 DIAGNOSIS — Z794 Long term (current) use of insulin: Secondary | ICD-10-CM

## 2022-06-02 DIAGNOSIS — I252 Old myocardial infarction: Secondary | ICD-10-CM

## 2022-06-02 DIAGNOSIS — Z96641 Presence of right artificial hip joint: Secondary | ICD-10-CM | POA: Diagnosis present

## 2022-06-02 DIAGNOSIS — N179 Acute kidney failure, unspecified: Secondary | ICD-10-CM | POA: Diagnosis present

## 2022-06-02 DIAGNOSIS — N3001 Acute cystitis with hematuria: Secondary | ICD-10-CM | POA: Diagnosis present

## 2022-06-02 DIAGNOSIS — J449 Chronic obstructive pulmonary disease, unspecified: Secondary | ICD-10-CM | POA: Diagnosis present

## 2022-06-02 DIAGNOSIS — I4821 Permanent atrial fibrillation: Secondary | ICD-10-CM | POA: Diagnosis present

## 2022-06-02 DIAGNOSIS — E1140 Type 2 diabetes mellitus with diabetic neuropathy, unspecified: Secondary | ICD-10-CM | POA: Diagnosis present

## 2022-06-02 DIAGNOSIS — I255 Ischemic cardiomyopathy: Secondary | ICD-10-CM | POA: Diagnosis present

## 2022-06-02 DIAGNOSIS — Z888 Allergy status to other drugs, medicaments and biological substances status: Secondary | ICD-10-CM

## 2022-06-02 DIAGNOSIS — I2510 Atherosclerotic heart disease of native coronary artery without angina pectoris: Secondary | ICD-10-CM | POA: Diagnosis present

## 2022-06-02 DIAGNOSIS — E1169 Type 2 diabetes mellitus with other specified complication: Secondary | ICD-10-CM | POA: Diagnosis present

## 2022-06-02 DIAGNOSIS — E871 Hypo-osmolality and hyponatremia: Secondary | ICD-10-CM | POA: Diagnosis not present

## 2022-06-02 DIAGNOSIS — Z951 Presence of aortocoronary bypass graft: Secondary | ICD-10-CM

## 2022-06-02 DIAGNOSIS — Z7901 Long term (current) use of anticoagulants: Secondary | ICD-10-CM

## 2022-06-02 DIAGNOSIS — D631 Anemia in chronic kidney disease: Secondary | ICD-10-CM | POA: Diagnosis present

## 2022-06-02 DIAGNOSIS — Z79890 Hormone replacement therapy (postmenopausal): Secondary | ICD-10-CM

## 2022-06-02 DIAGNOSIS — E118 Type 2 diabetes mellitus with unspecified complications: Secondary | ICD-10-CM | POA: Diagnosis present

## 2022-06-02 DIAGNOSIS — I447 Left bundle-branch block, unspecified: Secondary | ICD-10-CM | POA: Diagnosis present

## 2022-06-02 DIAGNOSIS — E66813 Obesity, class 3: Secondary | ICD-10-CM | POA: Diagnosis present

## 2022-06-02 DIAGNOSIS — Z79899 Other long term (current) drug therapy: Secondary | ICD-10-CM

## 2022-06-02 DIAGNOSIS — E1165 Type 2 diabetes mellitus with hyperglycemia: Secondary | ICD-10-CM | POA: Diagnosis present

## 2022-06-02 DIAGNOSIS — E875 Hyperkalemia: Secondary | ICD-10-CM | POA: Diagnosis present

## 2022-06-02 DIAGNOSIS — M4624 Osteomyelitis of vertebra, thoracic region: Secondary | ICD-10-CM | POA: Diagnosis present

## 2022-06-02 DIAGNOSIS — Z6841 Body Mass Index (BMI) 40.0 and over, adult: Secondary | ICD-10-CM

## 2022-06-02 DIAGNOSIS — I079 Rheumatic tricuspid valve disease, unspecified: Secondary | ICD-10-CM | POA: Diagnosis present

## 2022-06-02 DIAGNOSIS — Z7985 Long-term (current) use of injectable non-insulin antidiabetic drugs: Secondary | ICD-10-CM

## 2022-06-02 DIAGNOSIS — M4644 Discitis, unspecified, thoracic region: Secondary | ICD-10-CM | POA: Diagnosis present

## 2022-06-02 DIAGNOSIS — R7881 Bacteremia: Secondary | ICD-10-CM

## 2022-06-02 DIAGNOSIS — N184 Chronic kidney disease, stage 4 (severe): Secondary | ICD-10-CM | POA: Diagnosis present

## 2022-06-02 DIAGNOSIS — Z8249 Family history of ischemic heart disease and other diseases of the circulatory system: Secondary | ICD-10-CM

## 2022-06-02 DIAGNOSIS — Z95810 Presence of automatic (implantable) cardiac defibrillator: Secondary | ICD-10-CM

## 2022-06-02 LAB — COMPREHENSIVE METABOLIC PANEL
ALT: 28 U/L (ref 0–44)
AST: 36 U/L (ref 15–41)
Albumin: 2.6 g/dL — ABNORMAL LOW (ref 3.5–5.0)
Alkaline Phosphatase: 104 U/L (ref 38–126)
Anion gap: 10 (ref 5–15)
BUN: 46 mg/dL — ABNORMAL HIGH (ref 8–23)
CO2: 28 mmol/L (ref 22–32)
Calcium: 9.7 mg/dL (ref 8.9–10.3)
Chloride: 100 mmol/L (ref 98–111)
Creatinine, Ser: 1.87 mg/dL — ABNORMAL HIGH (ref 0.44–1.00)
GFR, Estimated: 27 mL/min — ABNORMAL LOW (ref >60)
Glucose, Bld: 262 mg/dL — ABNORMAL HIGH (ref 70–99)
Potassium: 5.2 mmol/L — ABNORMAL HIGH (ref 3.5–5.1)
Sodium: 138 mmol/L (ref 135–145)
Total Bilirubin: 2.6 mg/dL — ABNORMAL HIGH (ref 0.3–1.2)
Total Protein: 7.4 g/dL (ref 6.5–8.1)

## 2022-06-02 LAB — LIPASE, BLOOD: Lipase: 69 U/L — ABNORMAL HIGH (ref 11–51)

## 2022-06-02 LAB — CBC
HCT: 29.1 % — ABNORMAL LOW (ref 36.0–46.0)
Hemoglobin: 9.9 g/dL — ABNORMAL LOW (ref 12.0–15.0)
MCH: 29.9 pg (ref 26.0–34.0)
MCHC: 34 g/dL (ref 30.0–36.0)
MCV: 87.9 fL (ref 80.0–100.0)
Platelets: 293 10*3/uL (ref 150–400)
RBC: 3.31 MIL/uL — ABNORMAL LOW (ref 3.87–5.11)
RDW: 16.2 % — ABNORMAL HIGH (ref 11.5–15.5)
WBC: 14 10*3/uL — ABNORMAL HIGH (ref 4.0–10.5)
nRBC: 0 % (ref 0.0–0.2)

## 2022-06-02 MED ORDER — ACETAMINOPHEN 325 MG PO TABS
650.0000 mg | ORAL_TABLET | Freq: Once | ORAL | Status: AC
  Administered 2022-06-02: 650 mg via ORAL
  Filled 2022-06-02: qty 2

## 2022-06-02 NOTE — ED Triage Notes (Signed)
Per [facility] pt coming from home c/o UTI. Patient states she was here recently for UTI but prescribed a medication she was allergic to so had to get a different antibiotic. Started new med on 10/2. Still having strong urine odor. Family reports patient increasingly weaker. on 3L [place] continuously.

## 2022-06-02 NOTE — ED Provider Triage Note (Signed)
Emergency Medicine Provider Triage Evaluation Note  Elaine [name] , a 81 y.o. female  was evaluated in triage.  Pt complains of back pain.  Patient has history of diabetes hyperlipidemia hypertension osteoarthritis.  She was seen in the emergency room back on September 22.  Patient was diagnosed with a UTI.  She did have a CT scan performed at that time that did not show any acute abnormalities.  Patient states she had a reaction to the antibiotic that she was initially prescribed spoke to her doctor and was started on a different antibiotic.  She nitrofurantoin.  Patient came to the ED today because she continues to have pain in her back.  It hurts to move.  Review of Systems  Positive: Back pain Negative: Fever  Physical Exam  BP (!) 160/68   Pulse 73   Temp 98.5 F (36.9 C) (Oral)   Resp 18   Ht 1.575 m ('5\' 2"'$ )   Wt 99.8 kg   SpO2 96%   BMI 40.24 kg/m  Gen:   Awake, appears to be in pain Resp:  Normal effort  MSK:   Difficulty sitting up in bed, requires assistance, tenderness palpation lumbar thoracic paraspinal region Other:  Abdomen mild diffuse tenderness  Medical Decision Making  Medically screening exam initiated at 3:22 PM.  Appropriate orders placed.  YITTEL EMRICH was informed that the remainder of the evaluation will be completed by another provider, this initial triage assessment does not replace that evaluation, and the importance of remaining in the ED until their evaluation is complete.     Dorie Rank, MD 06/02/22 1524

## 2022-06-03 ENCOUNTER — Encounter (HOSPITAL_COMMUNITY): Payer: Self-pay | Admitting: Family Medicine

## 2022-06-03 ENCOUNTER — Inpatient Hospital Stay (HOSPITAL_COMMUNITY): Payer: Medicare Other

## 2022-06-03 ENCOUNTER — Emergency Department (HOSPITAL_COMMUNITY): Payer: Medicare Other

## 2022-06-03 DIAGNOSIS — E1140 Type 2 diabetes mellitus with diabetic neuropathy, unspecified: Secondary | ICD-10-CM | POA: Diagnosis present

## 2022-06-03 DIAGNOSIS — N184 Chronic kidney disease, stage 4 (severe): Secondary | ICD-10-CM

## 2022-06-03 DIAGNOSIS — N3090 Cystitis, unspecified without hematuria: Secondary | ICD-10-CM | POA: Insufficient documentation

## 2022-06-03 DIAGNOSIS — R7881 Bacteremia: Secondary | ICD-10-CM | POA: Diagnosis present

## 2022-06-03 DIAGNOSIS — I38 Endocarditis, valve unspecified: Secondary | ICD-10-CM | POA: Diagnosis not present

## 2022-06-03 DIAGNOSIS — B952 Enterococcus as the cause of diseases classified elsewhere: Secondary | ICD-10-CM | POA: Diagnosis present

## 2022-06-03 DIAGNOSIS — Z794 Long term (current) use of insulin: Secondary | ICD-10-CM | POA: Diagnosis not present

## 2022-06-03 DIAGNOSIS — J9611 Chronic respiratory failure with hypoxia: Secondary | ICD-10-CM | POA: Diagnosis present

## 2022-06-03 DIAGNOSIS — I5023 Acute on chronic systolic (congestive) heart failure: Secondary | ICD-10-CM | POA: Diagnosis present

## 2022-06-03 DIAGNOSIS — N390 Urinary tract infection, site not specified: Secondary | ICD-10-CM | POA: Diagnosis present

## 2022-06-03 DIAGNOSIS — Z87891 Personal history of nicotine dependence: Secondary | ICD-10-CM | POA: Diagnosis not present

## 2022-06-03 DIAGNOSIS — E118 Type 2 diabetes mellitus with unspecified complications: Secondary | ICD-10-CM

## 2022-06-03 DIAGNOSIS — I10 Essential (primary) hypertension: Secondary | ICD-10-CM

## 2022-06-03 DIAGNOSIS — I081 Rheumatic disorders of both mitral and tricuspid valves: Secondary | ICD-10-CM | POA: Diagnosis not present

## 2022-06-03 DIAGNOSIS — E1169 Type 2 diabetes mellitus with other specified complication: Secondary | ICD-10-CM | POA: Diagnosis present

## 2022-06-03 DIAGNOSIS — E1165 Type 2 diabetes mellitus with hyperglycemia: Secondary | ICD-10-CM | POA: Diagnosis present

## 2022-06-03 DIAGNOSIS — N3001 Acute cystitis with hematuria: Secondary | ICD-10-CM | POA: Diagnosis present

## 2022-06-03 DIAGNOSIS — I4821 Permanent atrial fibrillation: Secondary | ICD-10-CM | POA: Diagnosis present

## 2022-06-03 DIAGNOSIS — Z6841 Body Mass Index (BMI) 40.0 and over, adult: Secondary | ICD-10-CM | POA: Diagnosis not present

## 2022-06-03 DIAGNOSIS — I255 Ischemic cardiomyopathy: Secondary | ICD-10-CM | POA: Diagnosis not present

## 2022-06-03 DIAGNOSIS — I2510 Atherosclerotic heart disease of native coronary artery without angina pectoris: Secondary | ICD-10-CM

## 2022-06-03 DIAGNOSIS — I130 Hypertensive heart and chronic kidney disease with heart failure and stage 1 through stage 4 chronic kidney disease, or unspecified chronic kidney disease: Secondary | ICD-10-CM | POA: Diagnosis present

## 2022-06-03 DIAGNOSIS — D631 Anemia in chronic kidney disease: Secondary | ICD-10-CM | POA: Diagnosis present

## 2022-06-03 DIAGNOSIS — I509 Heart failure, unspecified: Secondary | ICD-10-CM | POA: Diagnosis not present

## 2022-06-03 DIAGNOSIS — I079 Rheumatic tricuspid valve disease, unspecified: Secondary | ICD-10-CM | POA: Diagnosis not present

## 2022-06-03 DIAGNOSIS — I330 Acute and subacute infective endocarditis: Secondary | ICD-10-CM | POA: Diagnosis present

## 2022-06-03 DIAGNOSIS — E871 Hypo-osmolality and hyponatremia: Secondary | ICD-10-CM | POA: Diagnosis not present

## 2022-06-03 DIAGNOSIS — M1990 Unspecified osteoarthritis, unspecified site: Secondary | ICD-10-CM | POA: Diagnosis present

## 2022-06-03 DIAGNOSIS — E039 Hypothyroidism, unspecified: Secondary | ICD-10-CM | POA: Diagnosis present

## 2022-06-03 DIAGNOSIS — M4624 Osteomyelitis of vertebra, thoracic region: Secondary | ICD-10-CM | POA: Diagnosis present

## 2022-06-03 DIAGNOSIS — N179 Acute kidney failure, unspecified: Secondary | ICD-10-CM | POA: Diagnosis not present

## 2022-06-03 DIAGNOSIS — N3091 Cystitis, unspecified with hematuria: Secondary | ICD-10-CM | POA: Insufficient documentation

## 2022-06-03 DIAGNOSIS — I340 Nonrheumatic mitral (valve) insufficiency: Secondary | ICD-10-CM | POA: Diagnosis not present

## 2022-06-03 DIAGNOSIS — I110 Hypertensive heart disease with heart failure: Secondary | ICD-10-CM | POA: Diagnosis not present

## 2022-06-03 DIAGNOSIS — E1122 Type 2 diabetes mellitus with diabetic chronic kidney disease: Secondary | ICD-10-CM | POA: Diagnosis present

## 2022-06-03 DIAGNOSIS — Z947 Corneal transplant status: Secondary | ICD-10-CM | POA: Diagnosis not present

## 2022-06-03 DIAGNOSIS — J449 Chronic obstructive pulmonary disease, unspecified: Secondary | ICD-10-CM | POA: Diagnosis present

## 2022-06-03 LAB — BLOOD CULTURE ID PANEL (REFLEXED) - BCID2

## 2022-06-03 LAB — I-STAT VENOUS BLOOD GAS, ED
Acid-Base Excess: 4 mmol/L — ABNORMAL HIGH (ref 0.0–2.0)
Bicarbonate: 28.6 mmol/L — ABNORMAL HIGH (ref 20.0–28.0)
Calcium, Ion: 1.22 mmol/L (ref 1.15–1.40)
HCT: 30 % — ABNORMAL LOW (ref 36.0–46.0)
Hemoglobin: 10.2 g/dL — ABNORMAL LOW (ref 12.0–15.0)
O2 Saturation: 90 %
Potassium: 4.2 mmol/L (ref 3.5–5.1)
Sodium: 137 mmol/L (ref 135–145)
TCO2: 30 mmol/L (ref 22–32)
pCO2, Ven: 41.7 mmHg — ABNORMAL LOW (ref 44–60)
pH, Ven: 7.445 — ABNORMAL HIGH (ref 7.25–7.43)
pO2, Ven: 57 mmHg — ABNORMAL HIGH (ref 32–45)

## 2022-06-03 LAB — URINALYSIS, ROUTINE W REFLEX MICROSCOPIC
Bilirubin Urine: NEGATIVE
Glucose, UA: NEGATIVE mg/dL
Ketones, ur: NEGATIVE mg/dL
Nitrite: NEGATIVE
Protein, ur: 100 mg/dL — AB
RBC / HPF: >50 RBC/hpf — ABNORMAL HIGH (ref 0–5)
Specific Gravity, Urine: 1.012 (ref 1.005–1.030)
WBC, UA: >50 WBC/hpf — ABNORMAL HIGH (ref 0–5)
pH: 5 (ref 5.0–8.0)

## 2022-06-03 LAB — COMPREHENSIVE METABOLIC PANEL
ALT: 27 U/L (ref 0–44)
AST: 31 U/L (ref 15–41)
Albumin: 2.4 g/dL — ABNORMAL LOW (ref 3.5–5.0)
Alkaline Phosphatase: 101 U/L (ref 38–126)
Anion gap: 13 (ref 5–15)
BUN: 51 mg/dL — ABNORMAL HIGH (ref 8–23)
CO2: 26 mmol/L (ref 22–32)
Calcium: 9.6 mg/dL (ref 8.9–10.3)
Chloride: 99 mmol/L (ref 98–111)
Creatinine, Ser: 1.87 mg/dL — ABNORMAL HIGH (ref 0.44–1.00)
GFR, Estimated: 27 mL/min — ABNORMAL LOW (ref >60)
Glucose, Bld: 182 mg/dL — ABNORMAL HIGH (ref 70–99)
Potassium: 4.4 mmol/L (ref 3.5–5.1)
Sodium: 138 mmol/L (ref 135–145)
Total Bilirubin: 2 mg/dL — ABNORMAL HIGH (ref 0.3–1.2)
Total Protein: 6.9 g/dL (ref 6.5–8.1)

## 2022-06-03 LAB — ECHOCARDIOGRAM COMPLETE
AR max vel: 1.16 cm2
AV Area VTI: 1.06 cm2
AV Area mean vel: 1.1 cm2
AV Mean grad: 8 mmHg
AV Peak grad: 15.2 mmHg
Ao pk vel: 1.95 m/s
Area-P 1/2: 5.84 cm2
Height: 62 in
MV M vel: 4.48 m/s
MV Peak grad: 80.3 mmHg
S' Lateral: 4.8 cm
Weight: 3520 oz

## 2022-06-03 LAB — CBG MONITORING, ED
Glucose-Capillary: 170 mg/dL — ABNORMAL HIGH (ref 70–99)
Glucose-Capillary: 173 mg/dL — ABNORMAL HIGH (ref 70–99)

## 2022-06-03 LAB — CBC
HCT: 28.6 % — ABNORMAL LOW (ref 36.0–46.0)
Hemoglobin: 9.3 g/dL — ABNORMAL LOW (ref 12.0–15.0)
MCH: 29 pg (ref 26.0–34.0)
MCHC: 32.5 g/dL (ref 30.0–36.0)
MCV: 89.1 fL (ref 80.0–100.0)
Platelets: 243 10*3/uL (ref 150–400)
RBC: 3.21 MIL/uL — ABNORMAL LOW (ref 3.87–5.11)
RDW: 16 % — ABNORMAL HIGH (ref 11.5–15.5)
WBC: 12.7 10*3/uL — ABNORMAL HIGH (ref 4.0–10.5)
nRBC: 0 % (ref 0.0–0.2)

## 2022-06-03 LAB — LACTIC ACID, PLASMA: Lactic Acid, Venous: 1.7 mmol/L (ref 0.5–1.9)

## 2022-06-03 LAB — HEMOGLOBIN A1C
Hgb A1c MFr Bld: 6.4 % — ABNORMAL HIGH (ref 4.8–5.6)
Mean Plasma Glucose: 136.98 mg/dL

## 2022-06-03 LAB — GLUCOSE, CAPILLARY
Glucose-Capillary: 198 mg/dL — ABNORMAL HIGH (ref 70–99)
Glucose-Capillary: 225 mg/dL — ABNORMAL HIGH (ref 70–99)

## 2022-06-03 LAB — BRAIN NATRIURETIC PEPTIDE: B Natriuretic Peptide: 1191.1 pg/mL — ABNORMAL HIGH (ref 0.0–100.0)

## 2022-06-03 MED ORDER — UMECLIDINIUM-VILANTEROL 62.5-25 MCG/ACT IN AEPB
1.0000 | INHALATION_SPRAY | Freq: Every day | RESPIRATORY_TRACT | Status: DC
  Administered 2022-06-04 – 2022-06-10 (×6): 1 via RESPIRATORY_TRACT
  Filled 2022-06-03: qty 14

## 2022-06-03 MED ORDER — LEVOTHYROXINE SODIUM 112 MCG PO TABS
112.0000 ug | ORAL_TABLET | Freq: Every day | ORAL | Status: DC
  Administered 2022-06-03 – 2022-06-10 (×7): 112 ug via ORAL
  Filled 2022-06-03 (×7): qty 1

## 2022-06-03 MED ORDER — ACETAMINOPHEN 650 MG RE SUPP: 650.0000 mg | Freq: Four times a day (QID) | RECTAL | Status: DC | PRN

## 2022-06-03 MED ORDER — ISOSORB DINITRATE-HYDRALAZINE 20-37.5 MG PO TABS
0.5000 | ORAL_TABLET | Freq: Three times a day (TID) | ORAL | Status: DC
  Administered 2022-06-03 – 2022-06-10 (×21): 0.5 via ORAL
  Filled 2022-06-03 (×2): qty 1
  Filled 2022-06-03: qty 0.5
  Filled 2022-06-03 (×2): qty 1
  Filled 2022-06-03: qty 0.5
  Filled 2022-06-03 (×8): qty 1
  Filled 2022-06-03: qty 0.5
  Filled 2022-06-03 (×9): qty 1

## 2022-06-03 MED ORDER — ONDANSETRON HCL 4 MG PO TABS: 4.0000 mg | ORAL_TABLET | Freq: Four times a day (QID) | ORAL | Status: DC | PRN

## 2022-06-03 MED ORDER — APIXABAN 2.5 MG PO TABS
2.5000 mg | ORAL_TABLET | Freq: Two times a day (BID) | ORAL | Status: DC
  Administered 2022-06-03 – 2022-06-10 (×15): 2.5 mg via ORAL
  Filled 2022-06-03 (×15): qty 1

## 2022-06-03 MED ORDER — ACETAMINOPHEN 325 MG PO TABS
650.0000 mg | ORAL_TABLET | Freq: Four times a day (QID) | ORAL | Status: DC | PRN
  Administered 2022-06-06: 650 mg via ORAL
  Filled 2022-06-03: qty 2

## 2022-06-03 MED ORDER — METOPROLOL SUCCINATE ER 50 MG PO TB24
50.0000 mg | ORAL_TABLET | Freq: Two times a day (BID) | ORAL | Status: DC
  Administered 2022-06-03 – 2022-06-10 (×15): 50 mg via ORAL
  Filled 2022-06-03 (×14): qty 1
  Filled 2022-06-03: qty 2

## 2022-06-03 MED ORDER — ROSUVASTATIN CALCIUM 5 MG PO TABS
5.0000 mg | ORAL_TABLET | ORAL | Status: DC
  Administered 2022-06-03 – 2022-06-09 (×4): 5 mg via ORAL
  Filled 2022-06-03 (×6): qty 1

## 2022-06-03 MED ORDER — AMOXICILLIN-POT CLAVULANATE 875-125 MG PO TABS: 1.0000 | ORAL_TABLET | Freq: Two times a day (BID) | ORAL | Status: DC

## 2022-06-03 MED ORDER — SENNOSIDES-DOCUSATE SODIUM 8.6-50 MG PO TABS
1.0000 | ORAL_TABLET | Freq: Every evening | ORAL | Status: DC | PRN
  Administered 2022-06-07: 1 via ORAL
  Filled 2022-06-03: qty 1

## 2022-06-03 MED ORDER — ALBUTEROL SULFATE (2.5 MG/3ML) 0.083% IN NEBU: 2.5000 mg | INHALATION_SOLUTION | Freq: Four times a day (QID) | RESPIRATORY_TRACT | Status: DC | PRN

## 2022-06-03 MED ORDER — INSULIN ASPART 100 UNIT/ML IJ SOLN
0.0000 [IU] | Freq: Three times a day (TID) | INTRAMUSCULAR | Status: DC
  Administered 2022-06-03 – 2022-06-04 (×4): 1 [IU] via SUBCUTANEOUS
  Administered 2022-06-04 (×2): 2 [IU] via SUBCUTANEOUS
  Administered 2022-06-05 – 2022-06-06 (×3): 1 [IU] via SUBCUTANEOUS
  Administered 2022-06-07: 2 [IU] via SUBCUTANEOUS
  Administered 2022-06-07 – 2022-06-08 (×2): 1 [IU] via SUBCUTANEOUS
  Administered 2022-06-08: 2 [IU] via SUBCUTANEOUS
  Administered 2022-06-09: 3 [IU] via SUBCUTANEOUS
  Administered 2022-06-10: 2 [IU] via SUBCUTANEOUS

## 2022-06-03 MED ORDER — INSULIN ASPART 100 UNIT/ML IJ SOLN
0.0000 [IU] | Freq: Every day | INTRAMUSCULAR | Status: DC
  Administered 2022-06-03: 2 [IU] via SUBCUTANEOUS
  Administered 2022-06-05: 3 [IU] via SUBCUTANEOUS
  Administered 2022-06-09: 2 [IU] via SUBCUTANEOUS

## 2022-06-03 MED ORDER — ONDANSETRON HCL 4 MG/2ML IJ SOLN: 4.0000 mg | Freq: Four times a day (QID) | INTRAMUSCULAR | Status: DC | PRN

## 2022-06-03 MED ORDER — CEPHALEXIN 250 MG PO CAPS
250.0000 mg | ORAL_CAPSULE | Freq: Two times a day (BID) | ORAL | Status: DC
  Administered 2022-06-03: 250 mg via ORAL
  Filled 2022-06-03: qty 1

## 2022-06-03 MED ORDER — PERFLUTREN LIPID MICROSPHERE
1.0000 mL | INTRAVENOUS | Status: AC | PRN
  Administered 2022-06-03: 2 mL via INTRAVENOUS

## 2022-06-03 MED ORDER — SODIUM CHLORIDE 0.9 % IV SOLN
2.0000 g | Freq: Two times a day (BID) | INTRAVENOUS | Status: DC
  Administered 2022-06-04 – 2022-06-05 (×4): 2 g via INTRAVENOUS
  Filled 2022-06-03 (×4): qty 2000

## 2022-06-03 MED ORDER — SODIUM CHLORIDE 0.9 % IV SOLN
2.0000 g | Freq: Once | INTRAVENOUS | Status: AC
  Administered 2022-06-03: 2 g via INTRAVENOUS
  Filled 2022-06-03: qty 20

## 2022-06-03 MED ORDER — FUROSEMIDE 10 MG/ML IJ SOLN
60.0000 mg | Freq: Once | INTRAMUSCULAR | Status: AC
  Administered 2022-06-03: 60 mg via INTRAVENOUS
  Filled 2022-06-03: qty 6

## 2022-06-03 MED ORDER — FUROSEMIDE 10 MG/ML IJ SOLN
60.0000 mg | Freq: Two times a day (BID) | INTRAMUSCULAR | Status: DC
  Administered 2022-06-03 – 2022-06-04 (×2): 60 mg via INTRAVENOUS
  Filled 2022-06-03 (×2): qty 6

## 2022-06-03 MED ORDER — SODIUM CHLORIDE 0.9% FLUSH
3.0000 mL | Freq: Two times a day (BID) | INTRAVENOUS | Status: DC
  Administered 2022-06-03 – 2022-06-10 (×11): 3 mL via INTRAVENOUS

## 2022-06-03 NOTE — Assessment & Plan Note (Signed)
Continue insulin sliding scale for glucose cover and monitoring,  Continue with rosuvastatin.

## 2022-06-03 NOTE — ED Notes (Signed)
Received verbal report from [place] at this time

## 2022-06-03 NOTE — Assessment & Plan Note (Signed)
COPD with no signs of exacerbation Continue bronchodilator therapy, oxymetry monitoring and supplemental 02 per [place] to keep 02 saturation 88% or greater.

## 2022-06-03 NOTE — Assessment & Plan Note (Signed)
Continue reate control with metoprolol Anticoagulation with apixaban.

## 2022-06-03 NOTE — Assessment & Plan Note (Signed)
Continue with levothyroxine  

## 2022-06-03 NOTE — Assessment & Plan Note (Signed)
Continue blood pressure control with metoprolol and bidil Diuresis with torsemide.

## 2022-06-03 NOTE — Assessment & Plan Note (Addendum)
Echocardiogram with reduced LV systolic function with EF 25 to 30%, global hypokinesis, mild dyskinesis of the left ventricular entire apical segment. Moderate reduction in RV systolic function, RVSP 15,7 mmHg. Moderate to severe TR   Systolic blood pressure 262 to 140 mmHg.   Plan to continue diuresis with furosemide  Continue with bidil and metoprolol.  No SGLT 2 inh due to frequent urinary tract infections.

## 2022-06-03 NOTE — ED Provider Notes (Signed)
[facility] EMERGENCY DEPARTMENT Provider Note  CSN: [id number] Arrival date & time: 06/02/22 1508  Chief Complaint(s) Weakness (UTI)  HPI Elaine [name] is a 81 y.o. female with a past medical history listed below including hypertension, hyperlipidemia, diabetes, COPD, CHF with a last EF of 20 to 25% status post ICD placement, A-fib status post ablation with pacemaker in place who was recently treated for urinary tract infection that grew out Enterococcus sensitive to Macrobid.  She returns today for generalized fatigue and recurrence of back pain which she felt with her prior urinary tract infection.  She is also had decreased appetite and p.o. intake.  She reports mild abdominal discomfort.  No diarrhea.  She is endorsing dyspnea on exertion.  No coughing or congestion.  No chest pain.   Weakness   Past Medical History Past Medical History:  Diagnosis Date   Acute myocardial infarction of anterior wall (HCC) 10/20/2013   Afib, LBBB   Acute pulmonary edema with congestive heart failure ([place]) 10/20/2013   Resolved   AICD (automatic cardioverter/defibrillator) present    Arthritis    Atrial fibrillation ([place]) 10/20/2013   On Warfarin   CAD, multiple vessel 10/20/2013   LM, LAD & RCA --> CABG; MYOVIEW 12/'15: Intermediate Risk would large anterior, anteroapical segment the apex possible infarct and peri-infarct ischemia   CHF (congestive heart failure) (HCC)    EF 40-45%.   Chronic kidney disease    COPD (chronic obstructive pulmonary disease) (HCC)    Diabetic neuropathy (HCC)    Heart murmur    Likely related to aortic sclerosis   Hyperlipidemia    Hypertension    Hypothyroidism    Ischemic cardiomyopathy - Notable Improvement in EF post CABG 10/20/2013   Echo 2/23: EF 20-25%; mild LVH. anteroseptal Akinesis; mid-apical anterior, inferior & inferoseptal + apical-lateral akinesis; G 1 DDysfxn.; Mild-Mod LA dil; mild MR; Mod PHTN;; F/u Echo 12/2013: EF 40-45%,  septal & apical HK, mild LVH; Mod LA dilation   Left bundle branch block (LBBB) on electrocardiogram    Mild mitral regurgitation by prior echocardiogram    Mild-Mod MR on Echo   Morbid obesity ([place])    OSTEOARTHRITIS 08/06/2006   S/P CABG x 3 10/23/2013   LIMA to LAD, SVG to OM2, SVG to RPLB, EVH via right thigh   Type II diabetes mellitus with complication ([place]) 22/48/2500   off rx since heart attack 12/15-dr told could stay off if keep cbg under 150   UTI (lower urinary tract infection) 09/29/2014   ongoing now. started rx 09/28/14   Patient Active Problem List   Diagnosis Date Noted   Acute on chronic systolic CHF (congestive heart failure) ([place]) 06/03/2022   Cystitis 06/03/2022   Chronic respiratory failure with hypoxia ([place]) 06/03/2022   Carpal tunnel syndrome, right upper limb 10/18/2020   Carpal tunnel syndrome, left upper limb 37/11/8887   Chronic systolic (congestive) heart failure ([place]) 07/29/2019   Hypothyroidism 06/16/2019   Acute on chronic combined systolic and diastolic CHF (congestive heart failure) ([place]) 06/11/2019   Bilateral leg pain 10/22/2017   Chronic combined systolic and diastolic CHF, NYHA class 2 ([place]) 10/22/2017   Edema of both legs 05/10/2015   Mass of right breast on mammogram 10/06/2014   DOE (dyspnea on exertion) 08/19/2014   Severe obesity (BMI >= 40) ([place]) 04/19/2014   CAD status post CABG 16/94/5038   Metabolic acidosis 88/28/0034    Class: Acute   Acute on chronic renal insufficiency 02/02/2014  Near syncope 02/02/2014   Hypotension 02/02/2014   Rash 02/02/2014   CKD (chronic kidney disease), stage IV (HCC) 02/02/2014    Class: Acute   Pruritus 02/02/2014    Class: Chronic   SOB (shortness of breath) 01/07/2014   Coumadin Rx post CABG 11/17/2013   Physical deconditioning 11/05/2013   S/P CABG x 3- 10/23/13 10/23/2013   Permanent atrial fibrillation (HCC) - CHA2DS2-VASc Score 6, on Eliquis 10/20/2013   Left bundle branch block (LBBB) on  electrocardiogram 10/20/2013   Left main coronary artery disease 10/20/2013    Class: Hospitalized for   History of acute anterior wall myocardial infarction 10/20/2013   Ischemic cardiomyopathy 10/20/2013   Morbid obesity- BMI 48    Diabetic neuropathy ([place])    Acute kidney injury superimposed on chronic kidney disease ([place])    Post corneal transplant 08/05/2012   Obesity 12/20/2006   Type II diabetes mellitus with complication ([place]) 86/76/1950   Hyperlipidemia with target LDL less than 70 08/06/2006   Essential hypertension 08/06/2006   OSTEOARTHRITIS 08/06/2006   Home Medication(s) Prior to Admission medications   Medication Sig Start Date End Date Taking? Authorizing Provider  acetaminophen (TYLENOL) 650 MG CR tablet Take 1,300 mg by mouth 2 (two) times daily.    [provider]  albuterol (VENTOLIN HFA) 108 (90 Base) MCG/ACT inhaler Inhale 2 puffs into the lungs every 6 (six) hours as needed for wheezing or shortness of breath.    [provider]  apixaban (ELIQUIS) 2.5 MG TABS tablet Take 2.5 mg by mouth 2 (two) times daily.    [provider]  Coenzyme Q10 (COQ-10) 100 MG CAPS Take 100 mg by mouth in the morning and at bedtime.     [provider]  colchicine 0.6 MG tablet Take 0.6 mg by mouth daily as needed (Gout).     [provider]  Cranberry 250 MG TABS Take 500 mg by mouth 2 (two) times daily.    [provider]  Dulaglutide (TRULICITY [place]) Inject 1.5 mg into the skin once a week. Tuesday    [provider]  ELDERBERRY PO Take 1 tablet by mouth 2 (two) times daily.    [provider]  ezetimibe (ZETIA) 10 MG tablet Take 10 mg by mouth daily.    [provider]  ferrous sulfate 325 (65 FE) MG tablet Take 325 mg by mouth every other day.    [provider]  HYDROcodone-acetaminophen (NORCO/VICODIN) 5-325 MG tablet Take 1 tablet by mouth every 6 (six) hours as needed for moderate pain.  04/13/22 04/13/23  Mcarthur Rossetti, MD  isosorbide-hydrALAZINE (BIDIL) 20-37.5 MG tablet Take 0.5 tablets by mouth 3 (three) times daily. 12/15/21   Larey Dresser, MD  levothyroxine (SYNTHROID) 112 MCG tablet Take 1 tablet (112 mcg total) by mouth daily before breakfast. 02/05/14   Donnajean Lopes, MD  metolazone (ZAROXOLYN) 2.5 MG tablet Take as directed by CHF Clinic only Patient taking differently: Take 2.5 mg by mouth daily as needed (CHF excessive edema of over 5 Lbs.). Take as directed by CHF Clinic only 01/27/22   Larey Dresser, MD  metoprolol succinate (TOPROL-XL) 100 MG 24 hr tablet Take 1 tablet (100 mg total) by mouth every morning AND 0.5 tablets (50 mg total) every evening. Take with or immediately following a meal.. 01/27/22   Larey Dresser, MD  Multiple Vitamins-Minerals (MULTI FOR HER 50+ PO) Take 1 tablet by mouth daily.    [provider]  Multiple Vitamins-Minerals (PRESERVISION AREDS 2) CAPS Take 1 capsule by mouth 2 (two) times daily.    [provider]  nitrofurantoin, macrocrystal-monohydrate, (MACROBID) 100 MG capsule Take 100 mg by mouth daily. 04/06/22   [provider]  potassium chloride SA (KLOR-CON M) 20 MEQ tablet Take 20 mEq by mouth every other day.    [provider]  rosuvastatin (CRESTOR) 5 MG tablet Take 5 mg by mouth every other day.     [provider]  torsemide (DEMADEX) 20 MG tablet TAKE 3 TABLETS BY MOUTH IN THE MORNING Patient taking differently: Take 60 mg by mouth daily. 02/16/22   Larey Dresser, MD  umeclidinium-vilanterol [facility] ELLIPTA) 62.5-25 MCG/INH AEPB Inhale 1 puff into the lungs daily.    [provider]                                                                                                                                    Allergies Crestor [rosuvastatin calcium], Allopurinol, and Tape  Review of Systems Review of Systems  Neurological:  Positive for weakness.    As noted in HPI  Physical Exam Vital Signs  I have reviewed the triage vital signs BP (!) 142/65   Pulse 74   Temp 98 F (36.7 C) (Oral)   Resp (!) 21   Ht '5\' 2"'$  (1.575 m)   Wt 99.8 kg   SpO2 100%   BMI 40.24 kg/m   Physical Exam Vitals reviewed.  Constitutional:      General: She is not in acute distress.    Appearance: She is well-developed. She is not diaphoretic.  HENT:     Head: Normocephalic and atraumatic.     Nose: Nose normal.  Eyes:     General: No scleral icterus.       Right eye: No discharge.        Left eye: No discharge.     Conjunctiva/sclera: Conjunctivae normal.     Pupils: Pupils are equal, round, and reactive to light.  Cardiovascular:     Rate and Rhythm: Normal rate and regular rhythm.     Heart sounds: No murmur heard.    No friction rub. No gallop.  Pulmonary:     Effort: Pulmonary effort is normal. No respiratory distress.     Breath sounds: Normal breath sounds. No stridor. No rales.  Abdominal:     General: There is no distension.     Palpations: Abdomen is soft.     Tenderness: There is abdominal tenderness in the left lower quadrant.  Musculoskeletal:        General: No tenderness.     Cervical back: Normal range of motion and neck supple.     Right lower leg: 1+ Pitting Edema present.     Left lower leg: 1+ Pitting Edema present.  Skin:    General: Skin is warm and dry.     Findings: No  erythema or rash.  Neurological:     Mental Status: She is alert and oriented to person, place, and time.     ED Results and Treatments Labs (all labs ordered are listed, but only abnormal results are displayed) Labs Reviewed  LIPASE, BLOOD - Abnormal; Notable for the following components:      Result Value   Lipase 69 (*)    All other components within normal limits  COMPREHENSIVE METABOLIC PANEL - Abnormal; Notable for the following components:   Potassium 5.2 (*)    Glucose, Bld 262 (*)    BUN 46 (*)    Creatinine, Ser 1.87 (*)     Albumin 2.6 (*)    Total Bilirubin 2.6 (*)    GFR, Estimated 27 (*)    All other components within normal limits  CBC - Abnormal; Notable for the following components:   WBC 14.0 (*)    RBC 3.31 (*)    Hemoglobin 9.9 (*)    HCT 29.1 (*)    RDW 16.2 (*)    All other components within normal limits  URINALYSIS, ROUTINE W REFLEX MICROSCOPIC - Abnormal; Notable for the following components:   APPearance TURBID (*)    Hgb urine dipstick LARGE (*)    Protein, ur 100 (*)    Leukocytes,Ua LARGE (*)    RBC / HPF >50 (*)    WBC, UA >50 (*)    Bacteria, UA MANY (*)    All other components within normal limits  BRAIN NATRIURETIC PEPTIDE - Abnormal; Notable for the following components:   B Natriuretic Peptide 1,191.1 (*)    All other components within normal limits  I-STAT VENOUS BLOOD GAS, ED - Abnormal; Notable for the following components:   pH, Ven 7.445 (*)    pCO2, Ven 41.7 (*)    pO2, Ven 57 (*)    Bicarbonate 28.6 (*)    Acid-Base Excess 4.0 (*)    HCT 30.0 (*)    Hemoglobin 10.2 (*)    All other components within normal limits  URINE CULTURE  CULTURE, BLOOD (ROUTINE X 2)  CULTURE, BLOOD (ROUTINE X 2)  LACTIC ACID, PLASMA  LACTIC ACID, PLASMA  HEMOGLOBIN A1C  COMPREHENSIVE METABOLIC PANEL  CBC                                                                                                                         EKG  EKG Interpretation  Date/Time:  Saturday June 03 2022 02:56:08 EDT Ventricular Rate:  73 PR Interval:    QRS Duration: 143 QT Interval:  447 QTC Calculation: 493 R Axis:   255 Text Interpretation: ventricular-paced rhythm No further analysis attempted due to paced rhythm Confirmed by Addison Lank [phone]) on 06/03/2022 4:56:22 AM       Radiology CT ABDOMEN PELVIS WO CONTRAST  Result Date: 06/03/2022 CLINICAL DATA:  81 year old female with left lower quadrant abdominal pain. History of recurrent UTI and sepsis. EXAM: CT ABDOMEN AND PELVIS WITHOUT  CONTRAST TECHNIQUE: Multidetector CT imaging of the abdomen and pelvis was performed following the standard protocol without IV contrast. RADIATION DOSE REDUCTION: This exam was performed according to the departmental dose-optimization program which includes automated exposure control, adjustment of the mA and/or kV according to patient size and/or use of iterative reconstruction technique. COMPARISON:  Noncontrast CT Abdomen and Pelvis 05/19/2022. FINDINGS: Lower chest: Prior sternotomy. Cardiac pacemaker leads. Mild cardiomegaly. No pericardial effusion. Increased bilateral lung base opacity since last month appears related to new trace pleural effusions and patchy bilateral lower lobe opacity with borderline consolidation. The lung base involvement is fairly symmetric. Hepatobiliary: Stable noncontrast liver and gallbladder. Pancreas: Stable, negative. Spleen: Stable, negative. Adrenals/Urinary Tract: Normal adrenal glands. Noncontrast kidneys are stable and nonobstructed. No hydronephrosis or pararenal inflammation. No convincing nephrolithiasis. No hydroureter. Urinary bladder partially obscured by hip arthroplasty streak artifact, but evidence of indistinct bladder wall inflammation at the dome which is visible on series 3, image 64. Stomach/Bowel: Extensive large bowel diverticulosis from the transverse colon distally. The the hepatic flexure and mid transverse colon are more gas distended today. But there is no transition point, with retained gas and stool in redundant distal transverse and splenic flexure. Descending colon is decompressed. Rectosigmoid colon decompressed. Extensive diverticula but no definite active large bowel inflammation. Cecum on a lax mesentery. Diverticulosis of the terminal ileum (series 3, image 41) which is decompressed. Cecum is gas distended similar to the mid transverse colon. No cecal wall thickening. Appendix not identified. No pericecal inflammation. No free air or free fluid  identified. Gas-filled but nondilated distal small bowel in the right abdomen. A small fat containing umbilical hernia appears stable and inconsequential. Negative stomach. Duodenum is decompressed. Vascular/Lymphatic: Extensive Aortoiliac calcified atherosclerosis. Vascular patency is not evaluated in the absence of IV contrast. Normal caliber abdominal aorta. No lymphadenopathy identified. Reproductive: Uterus seems to be surgically absent. Ovaries are not identified. Other: Limited visualization of the pelvis from hip arthroplasty streak artifact as before. Musculoskeletal: Chronic spine degeneration with lumbosacral junction ankylosis and osteopenia. Chronic bilateral hip arthroplasty with subsequent streak artifact in the pelvis. Prior sternotomy. No acute osseous abnormality identified. IMPRESSION: 1. Increased lung base opacification since last month appears related to trace new pleural effusions and patchy opacity. Symmetry argues in favor of atelectasis, but acute lung base infection cannot be excluded. 2. Limited CT visualization of the pelvis secondary to artifact from bilateral hip arthroplasties. But the visible urinary bladder dome appears inflamed compatible with UTI and/or Cystitis. 3. No evidence of obstructive uropathy or ascending urinary infection on this noncontrast exam. 4. Widespread severe large bowel diverticulosis, and some distal small bowel diverticulosis also, but no active inflammation identified. Increased gaseous distension of the proximal large bowel might reflect Ileus. 5. Severe Aortic Atherosclerosis (ICD10-I70.0). Electronically Signed   By: Genevie Ann M.D.   On: 06/03/2022 04:49   DG Chest Port 1 View  Result Date: 06/03/2022 CLINICAL DATA:  Back pain.  No trauma. EXAM: PORTABLE CHEST 1 VIEW COMPARISON:  05/19/2022 FINDINGS: Cardiac pacemaker. Postoperative changes in the mediastinum. Cardiac enlargement. Mild vascular congestion. Mild interstitial changes suggesting early  edema. Small left pleural effusion. No pneumothorax. Calcification of the aorta. IMPRESSION: Cardiac enlargement with mild vascular congestion and edema. Small left pleural effusion. Electronically Signed   By: Lucienne Capers M.D.   On: 06/03/2022 02:13   DG Lumbar Spine 2-3 Views  Result Date: 06/03/2022 CLINICAL DATA:  Back pain EXAM: LUMBAR SPINE - 2-3 VIEW COMPARISON:  CT abdomen and  pelvis 05/19/2022 FINDINGS: Five lumbar type vertebral bodies. Normal alignment. No acute vertebral compression deformities. No focal bone lesion or bone destruction. Degenerative changes with disc space narrowing and endplate osteophyte formation. Multilevel degenerative disc disease. Degenerative changes in the facet joints. Prominent vascular calcification in the aorta. Bilateral hip arthroplasties. IMPRESSION: Normal alignment. Degenerative changes. No acute displaced fractures identified. Electronically Signed   By: Lucienne Capers M.D.   On: 06/03/2022 02:12   DG Thoracic Spine 2 View  Result Date: 06/03/2022 CLINICAL DATA:  Back pain.  No trauma. EXAM: THORACIC SPINE 2 VIEWS COMPARISON:  Two-view chest 07/29/2019 FINDINGS: Normal alignment of the thoracic spine. Mild vertebral wedge deformities of the midthoracic region similar to prior study. No new vertebral compression is identified. Degenerative changes with narrowed disc spaces and endplate osteophyte formation throughout. No focal bone lesion or bone destruction. Bone cortex appears intact. No abnormal paraspinal soft tissue swelling. Mild thoracic scoliosis convex towards the right is unchanged. Postoperative changes noted in the mediastinum. IMPRESSION: Normal alignment of the thoracic spine. No acute displaced fractures identified. Degenerative changes. Electronically Signed   By: Lucienne Capers M.D.   On: 06/03/2022 02:10    Medications Ordered in ED Medications  isosorbide-hydrALAZINE (BIDIL) 20-37.5 MG per tablet 0.5 tablet (has no administration  in time range)  metoprolol succinate (TOPROL-XL) 24 hr tablet 50 mg (has no administration in time range)  rosuvastatin (CRESTOR) tablet 5 mg (has no administration in time range)  levothyroxine (SYNTHROID) tablet 112 mcg (has no administration in time range)  apixaban (ELIQUIS) tablet 2.5 mg (has no administration in time range)  albuterol (PROVENTIL) (2.5 MG/3ML) 0.083% nebulizer solution 2.5 mg (has no administration in time range)  umeclidinium-vilanterol (ANORO ELLIPTA) 62.5-25 MCG/ACT 1 puff (has no administration in time range)  insulin aspart (novoLOG) injection 0-6 Units (has no administration in time range)  insulin aspart (novoLOG) injection 0-5 Units (has no administration in time range)  furosemide (LASIX) injection 60 mg (has no administration in time range)  sodium chloride flush (NS) 0.9 % injection 3 mL (has no administration in time range)  acetaminophen (TYLENOL) tablet 650 mg (has no administration in time range)    Or  acetaminophen (TYLENOL) suppository 650 mg (has no administration in time range)  senna-docusate (Senokot-S) tablet 1 tablet (has no administration in time range)  ondansetron (ZOFRAN) tablet 4 mg (has no administration in time range)    Or  ondansetron (ZOFRAN) injection 4 mg (has no administration in time range)  acetaminophen (TYLENOL) tablet 650 mg (650 mg Oral Given 06/02/22 1532)  furosemide (LASIX) injection 60 mg (60 mg Intravenous Given 06/03/22 0524)  cefTRIAXone (ROCEPHIN) 2 g in sodium chloride 0.9 % 100 mL IVPB (0 g Intravenous Stopped 06/03/22 0605)  Procedures .1-3 Lead EKG Interpretation  Performed by: Fatima Blank, MD Authorized by: Fatima Blank, MD     Interpretation: normal     ECG rate:  75   ECG rate assessment: normal     Rhythm: other rhythm     Rhythm comment:  Paced rhythm    Ectopy: none     Conduction: normal   .Critical Care  Performed by: Fatima Blank, MD Authorized by: Fatima Blank, MD   Critical care provider statement:    Critical care time (minutes):  45   Critical care time was exclusive of:  Separately billable procedures and treating other patients   Critical care was necessary to treat or prevent imminent or life-threatening deterioration of the following conditions:  Cardiac failure   Critical care was time spent personally by me on the following activities:  Development of treatment plan with patient or surrogate, discussions with consultants, evaluation of patient's response to treatment, examination of patient, obtaining history from patient or surrogate, review of old charts, re-evaluation of patient's condition, pulse oximetry, ordering and review of radiographic studies, ordering and review of laboratory studies and ordering and performing treatments and interventions   Care discussed with: admitting provider     (including critical care time)  Medical Decision Making / ED Course   Medical Decision Making Amount and/or Complexity of Data Reviewed External Data Reviewed: radiology.    Details: ECHO noted above Labs: ordered. Decision-making details documented in ED Course. Radiology: ordered and independent interpretation performed. Decision-making details documented in ED Course. ECG/medicine tests: ordered and independent interpretation performed. Decision-making details documented in ED Course.  Risk Prescription drug management. Decision regarding hospitalization.    Patient presents with generalized fatigue and dyspnea on exertion, evidence of volume overload on exam with history of heart failure.  We will need to assess for CHF exacerbation. We will also check for pneumonia.  Given abdominal discomfort and back pain similar to recent UTI, will assess for recurrence of her infection.  We will also assess for  other intra-abdominal inflammatory/infectious process such as diverticulitis.  CBC is notable for leukocytosis and anemia. Metabolic panel with evidence of AKI, mild hyperkalemia.  Hyperglycemia without evidence of DKA.  Chest x-ray notable for evidence of pulmonary edema and pleural effusion concerning for heart failure exacerbation BNP elevated at 1100 Patient started on Lasix.  UA was consistent with a urinary tract infection. Sent for cultures. Started on empiric antibiotics.  Admitted to medicine for further work-up and management.       Final Clinical Impression(s) / ED Diagnoses Final diagnoses:  Generalized weakness  Acute on chronic systolic congestive heart failure ([place])  Acute cystitis with hematuria           This chart was dictated using voice recognition software.  Despite best efforts to proofread,  errors can occur which can change the documentation meaning.    Fatima Blank, MD 06/03/22 [phone]

## 2022-06-03 NOTE — Assessment & Plan Note (Deleted)
CKD stage IV, Hyponatremia  Renal function with serum cr at 2,25 with K at 4,1 and serum bicarbonate at 24. Plan to continue diuresis with oral torsemide Follow up renal function in am.

## 2022-06-03 NOTE — ED Notes (Signed)
Pt moved to room 38 at this time 

## 2022-06-03 NOTE — Hospital Course (Signed)
Mr. Eide was admitted to the hospital with the working diagnosis of decompensated heart failure.   81 yo female with the past medical history of coronary artery disease, atrial fibrillation, COPD, chronic hypoxemic respiratory failure and systolic heart failure who presented with dyspnea and edema. Reported worsening dyspnea and lower extremity edema at home, positive dysuria and back pain. On his initial physical examination his blood pressure was 150/68, HR 73, RR 20 and 02 saturation 99% on 3 L/min per [place], lungs with rales bilaterally, with no wheezing, heart with S1 and S2 present and rhythmic, abdomen with no distention and positive lower extremity edema.   Na 138, K 5,2 CL 100 bicarbonate 28 glucose 262 bun 46 cr 1,87  BNP 1,191  Wbc 14,0 hgb 9,9 plt 293   Chest radiograph with cardiomegaly, no infiltrates, small left pleural effusion, pacemaker with one ventricular lead and one defibrillator lead in place.   CT abdomen with inflammation at the urinary bladder, positive diverticulosis with no diverticulitis.   EKG 73 bpm, right axis deviation, qtc not prolonged, ventricular paced rhythm with underlying atrial fibrillation, no significant ST segment or T wave changes.   Patient was placed on IV furosemide and IV antibiotic therapy .   Blood culture positive for enterococcus, antibiotic therapy was changed to IV ampicillin ID consultation has recommended TEE.

## 2022-06-03 NOTE — Assessment & Plan Note (Signed)
Calculated BMI 40,2

## 2022-06-03 NOTE — ED Notes (Addendum)
Pt Daughter [name] would like a update [phone]

## 2022-06-03 NOTE — Assessment & Plan Note (Signed)
Patient with no chest pain.

## 2022-06-03 NOTE — Progress Notes (Signed)
Progress Note   Patient: Elaine [name] ION:[id number] DOB: 23-Feb-1941 DOA: 06/02/2022     0 DOS: the patient was seen and examined on 06/03/2022   Brief hospital course: Mr. Musil was admitted to the hospital with the working diagnosis of decompensated heart failure.   81 yo female with the past medical history of coronary artery disease, atrial fibrillation, COPD, chronic hypoxemic respiratory failure and systolic heart failure who presented with dyspnea and edema. Reported worsening dyspnea and lower extremity edema at home, positive dysuria and back pain. On his initial physical examination his blood pressure was 150/68, HR 73, RR 20 and 02 saturation 99% on 3 L/min per [place], lungs with rales bilaterally, with no wheezing, heart with S1 and S2 present and rhythmic, abdomen with no distention and positive lower extremity edema.   Na 138, K 5,2 CL 100 bicarbonate 28 glucose 262 bun 46 cr 1,87  BNP 1,191  Wbc 14,0 hgb 9,9 plt 293   Chest radiograph with cardiomegaly, no infiltrates, small left pleural effusion, pacemaker with one ventricular lead and one defibrillator lead in place.   CT abdomen with inflammation at the urinary bladder, positive diverticulosis with no diverticulitis.   EKG 73 bpm, right axis deviation, qtc not prolonged, ventricular paced rhythm with underlying atrial fibrillation, no significant ST segment or T wave changes.   Patient was placed on IV furosemide and IV antibiotic therapy .      Assessment and Plan: * Acute on chronic systolic CHF (congestive heart failure) (HCC) Echocardiogram with reduced LV systolic function with EF 25 to 30%, global hypokinesis, mild dyskinesis of the left ventricular entire apical segment. Moderate reduction in RV systolic function, RVSP 24,4 mmHg. Moderate to severe TR   Systolic blood pressure 010 to 140 mmHg.   Plan to continue diuresis with furosemide  Continue with bidil and metoprolol.  No SGLT 2 inh due to frequent urinary  tract infections.   UTI (urinary tract infection) Continue antibiotic therapy with oral cephalexin for 3 days.  Patient had IV ceftriaxone in the ED   Permanent atrial fibrillation (HCC) - CHA2DS2-VASc Score 6, on Eliquis Continue reate control with metoprolol Anticoagulation with apixaban.   CKD (chronic kidney disease), stage IV ([place]) Renal function with serum cr 1,87 Plan to continue diuresis with furosemide to target further negative fluid balance.  Follow up renal function and electrolytes in am Avoid hypotension and nephrotoxic medications.   CAD status post CABG Patient with no chest pain.   Essential hypertension Continue blood pressure control with metoprolol and bidil Diuresis with furosemide.   Type II diabetes mellitus with complication (HCC) Continue insulin sliding scale for glucose cover and monitoring,   Chronic respiratory failure with hypoxia (HCC) COPD with no signs of exacerbation Continue bronchodilator therapy, oxymetry monitoring and supplemental 02 per [place] to keep 02 saturation 88% or greater.   Hypothyroidism Continue with levothyroxine   Class 3 obesity (HCC) Calculated BMI 40,2         Subjective: Patient with no chest pain, dyspnea and edema improving but not back to baseline,   Physical Exam: Vitals:   06/03/22 1400 06/03/22 1500 06/03/22 1541 06/03/22 1600  BP: (!) 143/67 (!) 135/98  (!) 138/56  Pulse: 75 73  76  Resp: (!) 27 (!) 24  17  Temp:   98.4 F (36.9 C)   TempSrc:   Oral   SpO2: 97% 100%  97%  Weight:      Height:  Neurology awake and alert ENT with mild pallor Cardiovascular with S1 and S2 present and rhythmic with no gallops  Respiratory with mild rales but not wheezing Abdomen with no distention  Positive lower extremity edema ++ pitting  Data Reviewed:    Family Communication: no family at the bedside   Disposition: Status is: Inpatient Remains inpatient appropriate because: heart failure   Planned  Discharge Destination: Home      Author: Tawni Millers, MD 06/03/2022 5:10 PM  For on call review [email or web address].

## 2022-06-03 NOTE — Progress Notes (Signed)
PHARMACY - PHYSICIAN COMMUNICATION CRITICAL VALUE ALERT - BLOOD CULTURE IDENTIFICATION (BCID)  Elaine [name] is an 81 y.o. female who presented to [facility] on 06/02/2022 with a chief complaint of HF exacerbation.  Assessment:  Blood cultures 4/4 growing E faecalis with no resistance. Patient is on cephalexin.   Name of physician (or Provider) Contacted: Dr. Nevada Crane  Current antibiotics: cephalexin  Changes to prescribed antibiotics recommended: Change to ampicillin per protocol. Recommendations accepted by provider  Results for orders placed or performed during the hospital encounter of 06/02/22  Blood Culture ID Panel (Reflexed) (Collected: 06/03/2022  5:11 AM)  Result Value Ref Range   Enterococcus faecalis DETECTED (A) NOT DETECTED   Enterococcus Faecium NOT DETECTED NOT DETECTED   Listeria monocytogenes NOT DETECTED NOT DETECTED   Staphylococcus species NOT DETECTED NOT DETECTED   Staphylococcus aureus (BCID) NOT DETECTED NOT DETECTED   Staphylococcus epidermidis NOT DETECTED NOT DETECTED   Staphylococcus lugdunensis NOT DETECTED NOT DETECTED   Streptococcus species NOT DETECTED NOT DETECTED   Streptococcus agalactiae NOT DETECTED NOT DETECTED   Streptococcus pneumoniae NOT DETECTED NOT DETECTED   Streptococcus pyogenes NOT DETECTED NOT DETECTED   A.calcoaceticus-baumannii NOT DETECTED NOT DETECTED   Bacteroides fragilis NOT DETECTED NOT DETECTED   Enterobacterales NOT DETECTED NOT DETECTED   Enterobacter cloacae complex NOT DETECTED NOT DETECTED   Escherichia coli NOT DETECTED NOT DETECTED   Klebsiella aerogenes NOT DETECTED NOT DETECTED   Klebsiella oxytoca NOT DETECTED NOT DETECTED   Klebsiella pneumoniae NOT DETECTED NOT DETECTED   Proteus species NOT DETECTED NOT DETECTED   Salmonella species NOT DETECTED NOT DETECTED   Serratia marcescens NOT DETECTED NOT DETECTED   Haemophilus influenzae NOT DETECTED NOT DETECTED   Neisseria meningitidis NOT DETECTED NOT DETECTED    Pseudomonas aeruginosa NOT DETECTED NOT DETECTED   Stenotrophomonas maltophilia NOT DETECTED NOT DETECTED   Candida albicans NOT DETECTED NOT DETECTED   Candida auris NOT DETECTED NOT DETECTED   Candida glabrata NOT DETECTED NOT DETECTED   Candida krusei NOT DETECTED NOT DETECTED   Candida parapsilosis NOT DETECTED NOT DETECTED   Candida tropicalis NOT DETECTED NOT DETECTED   Cryptococcus neoformans/gattii NOT DETECTED NOT DETECTED   Vancomycin resistance NOT DETECTED NOT DETECTED   Benetta Spar, PharmD, BCPS, BCCP Clinical Pharmacist  Please check AMION for all [facility] phone numbers After 10:00 PM, call [place] [phone]

## 2022-06-03 NOTE — H&P (Signed)
History and Physical    Elaine [name] DQQ:[id number] DOB: 1940-09-24 DOA: 06/02/2022  PCP: Ginger Organ., MD   Patient coming from: Home   Chief Complaint: Back pain, dysuria, fatigue, increased leg swelling   HPI: Elaine [name] is a pleasant 81 y.o. female with medical history significant for CAD, atrial fibrillation on Eliquis, chronic combined systolic and diastolic CHF, hypothyroidism, COPD, and chronic hypoxic respiratory failure who presents to the emergency department with multiple complaints including low back pain, dysuria, fatigue, and increased bilateral leg swelling.  Patient reports a long history of recurrent UTIs and has been experiencing dysuria and low back pain again recently.  She has also noticed increased bilateral lower extremity swelling, and increased fatigue.  She denies chest pain, fever, or chills.  ED Course: Upon arrival to the ED, patient is found to be afebrile and saturating well on 3 L/min of supplemental oxygen with stable blood pressure.  Chemistry panel notable for creatinine 1.87 and glucose 262.  CBC features a leukocytosis to 14,000 and normocytic anemia with hemoglobin 9.9.  BNP has increased to 1191.  CT of the abdomen and pelvis notable for inflammatory changes involving the urinary bladder.  Plain radiographs of the lumbar and thoracic spine are negative for acute fracture.  Chest x-ray notable for cardiomegaly with mild vascular congestion, edema, and small left pleural effusion.  Urinalysis and urine culture were ordered from the emergency department and the patient was treated with acetaminophen, 60 mg IV Lasix, and Rocephin.  Review of Systems:  All other systems reviewed and apart from HPI, are negative.  Past Medical History:  Diagnosis Date   Acute myocardial infarction of anterior wall ([place]) 10/20/2013   Afib, LBBB   Acute pulmonary edema with congestive heart failure ([place]) 10/20/2013   Resolved   AICD (automatic  cardioverter/defibrillator) present    Arthritis    Atrial fibrillation ([place]) 10/20/2013   On Warfarin   CAD, multiple vessel 10/20/2013   LM, LAD & RCA --> CABG; MYOVIEW 12/'15: Intermediate Risk would large anterior, anteroapical segment the apex possible infarct and peri-infarct ischemia   CHF (congestive heart failure) (HCC)    EF 40-45%.   Chronic kidney disease    COPD (chronic obstructive pulmonary disease) (HCC)    Diabetic neuropathy (HCC)    Heart murmur    Likely related to aortic sclerosis   Hyperlipidemia    Hypertension    Hypothyroidism    Ischemic cardiomyopathy - Notable Improvement in EF post CABG 10/20/2013   Echo 2/23: EF 20-25%; mild LVH. anteroseptal Akinesis; mid-apical anterior, inferior & inferoseptal + apical-lateral akinesis; G 1 DDysfxn.; Mild-Mod LA dil; mild MR; Mod PHTN;; F/u Echo 12/2013: EF 40-45%, septal & apical HK, mild LVH; Mod LA dilation   Left bundle branch block (LBBB) on electrocardiogram    Mild mitral regurgitation by prior echocardiogram    Mild-Mod MR on Echo   Morbid obesity ([place])    OSTEOARTHRITIS 08/06/2006   S/P CABG x 3 10/23/2013   LIMA to LAD, SVG to OM2, SVG to RPLB, EVH via right thigh   Type II diabetes mellitus with complication ([place]) 19/41/7408   off rx since heart attack 12/15-dr told could stay off if keep cbg under 150   UTI (lower urinary tract infection) 09/29/2014   ongoing now. started rx 09/28/14    Past Surgical History:  Procedure Laterality Date   ABDOMINAL HYSTERECTOMY     AV NODE ABLATION N/A 05/19/2020   Procedure: AV NODE ABLATION;  Surgeon: Constance Haw, MD;  Location: [place] [facility];  Service: Cardiovascular;  Laterality: N/A;   BIV ICD INSERTION CRT-D N/A 07/28/2019   Procedure: BIV ICD INSERTION CRT-D;  Surgeon: Constance Haw, MD;  Location: [place] [facility];  Service: Cardiovascular;  Laterality: N/A;   BREAST BIOPSY Right 08/04/2016    fibroadenoma with calcifications    BREAST  BIOPSY Right 07/16/2014   fibroadenoma with calcifications and atypical   BREAST EXCISIONAL BIOPSY Right 10/06/2014   atypical lobular hyperplasia    BREAST LUMPECTOMY W/ NEEDLE LOCALIZATION Right 10/06/2014   Dr Georgette Dover   BREAST LUMPECTOMY WITH NEEDLE LOCALIZATION Right 10/06/2014   Procedure: RIGHT BREAST LUMPECTOMY WITH NEEDLE LOCALIZATION;  Surgeon: Donnie Mesa, MD;  Location: [place];  Service: General;  Laterality: Right;   CARDIOVERSION N/A 10/22/2019   Procedure: CARDIOVERSION;  Surgeon: Larey Dresser, MD;  Location: [facility] ENDOSCOPY;  Service: Cardiovascular;  Laterality: N/A;   CARPAL TUNNEL RELEASE Right 04/13/2022   Procedure: RIGHT OPEN CARPAL TUNNEL RELEASE;  Surgeon: Mcarthur Rossetti, MD;  Location: [place];  Service: Orthopedics;  Laterality: Right;   CLIPPING OF ATRIAL APPENDAGE N/A 10/23/2013   Procedure: CLIPPING OF ATRIAL APPENDAGE;  Surgeon: Rexene Alberts, MD;  Location: [place];  Service: Open Heart Surgery;  Laterality: N/A;   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   CORONARY ARTERY BYPASS GRAFT N/A 10/23/2013   Procedure: CORONARY ARTERY BYPASS GRAFTING (CABG);  Surgeon: Rexene Alberts, MD;  Location: [place];  Service: Open Heart Surgery: LIMA-LAD, SVG-OM2, SVG-RPL   INTRAOPERATIVE TRANSESOPHAGEAL ECHOCARDIOGRAM N/A 10/23/2013   Procedure: INTRAOPERATIVE TRANSESOPHAGEAL ECHOCARDIOGRAM;  Surgeon: Rexene Alberts, MD;  Location: [place];  Service: Open Heart Surgery;  Laterality: N/A;   LEFT HEART CATHETERIZATION WITH CORONARY ANGIOGRAM N/A 10/20/2013   Procedure: LEFT HEART CATHETERIZATION WITH CORONARY ANGIOGRAM;  Surgeon: Leonie Man, MD;  Location: [facility] CATH LAB;  Service: Cardiovascular: Distal LM ~70%, LAD - ostial 70%, prox 80, 95 & 95%; RCA prox 70%, mid 80%; minimal Cx disease   LEFT HEART CATHETERIZATION WITH CORONARY/GRAFT ANGIOGRAM N/A 09/08/2014   Procedure: LEFT HEART CATHETERIZATION WITH Beatrix Fetters;  Surgeon: Leonie Man, MD;  Location: [facility] CATH  LAB: Indication: Abnormal Myoview. Occluded LAD after 70 and 80% left main. Diffuse RCA disease. Widely patent grafts to Moderately diseased artery vessels. Mild-moderate LV dysfunction and chronic A. fib.   NM MYOVIEW LTD  08/14/2014   Lexi scan: INTERMEDIATE RISK - large, severe intensity perfusion defect in anterior wall, apex and inferior apex that is partly reversible. This suggests either scar or possible hibernating myocardium. Nondeviated.-> Likely scar. No change in coronary anatomy with patent grafts.   TOTAL HIP ARTHROPLASTY  2010, 2011   right 2010, left 2011   TRANSTHORACIC ECHOCARDIOGRAM  12/2013   Mildly dilated left ventricle with mild to moderately reduced function. EF 40-45% (notable improvement from February 2015). Septal and apical hypokinesis. Mild LA dilation.   TRANSTHORACIC ECHOCARDIOGRAM  11/11/2019   EF 20% with severely dysfunction. Global HK. Unable to determine diastolic pressures. Mild RV function-with mildly elevated PAP-RAP estimated 15 mmHg.. Moderate LA dilation, mild RA dilation.    Social History:   reports that she quit smoking about 28 years ago. Her smoking use included cigarettes. She has a 19.50 pack-year smoking history. She has never used smokeless tobacco. She reports that she does not drink alcohol and does not use drugs.  Allergies  Allergen Reactions   Crestor [Rosuvastatin Calcium]  Other (See Comments)    Cramps- still taking    Allopurinol Hives   Tape Rash    Adhesive tape    Family History  Adopted: Yes  Problem Relation Age of Onset   Other Mother    Hypertension Mother    Colon cancer Neg Hx    Esophageal cancer Neg Hx    Rectal cancer Neg Hx    Stomach cancer Neg Hx      Prior to Admission medications   Medication Sig Start Date End Date Taking? Authorizing Provider  acetaminophen (TYLENOL) 650 MG CR tablet Take 1,300 mg by mouth 2 (two) times daily.    [provider]  albuterol (VENTOLIN HFA) 108 (90 Base) MCG/ACT  inhaler Inhale 2 puffs into the lungs every 6 (six) hours as needed for wheezing or shortness of breath.    [provider]  apixaban (ELIQUIS) 2.5 MG TABS tablet Take 2.5 mg by mouth 2 (two) times daily.    [provider]  Coenzyme Q10 (COQ-10) 100 MG CAPS Take 100 mg by mouth in the morning and at bedtime.     [provider]  colchicine 0.6 MG tablet Take 0.6 mg by mouth daily as needed (Gout).     [provider]  Cranberry 250 MG TABS Take 500 mg by mouth 2 (two) times daily.    [provider]  Dulaglutide (TRULICITY ) Inject 1.5 mg into the skin once a week. Tuesday    [provider]  ELDERBERRY PO Take 1 tablet by mouth 2 (two) times daily.    [provider]  ezetimibe (ZETIA) 10 MG tablet Take 10 mg by mouth daily.    [provider]  ferrous sulfate 325 (65 FE) MG tablet Take 325 mg by mouth every other day.    [provider]  HYDROcodone-acetaminophen (NORCO/VICODIN) 5-325 MG tablet Take 1 tablet by mouth every 6 (six) hours as needed for moderate pain. 04/13/22 04/13/23  Mcarthur Rossetti, MD  isosorbide-hydrALAZINE (BIDIL) 20-37.5 MG tablet Take 0.5 tablets by mouth 3 (three) times daily. 12/15/21   Larey Dresser, MD  levothyroxine (SYNTHROID) 112 MCG tablet Take 1 tablet (112 mcg total) by mouth daily before breakfast. 02/05/14   Donnajean Lopes, MD  metolazone (ZAROXOLYN) 2.5 MG tablet Take as directed by CHF Clinic only Patient taking differently: Take 2.5 mg by mouth daily as needed (CHF excessive edema of over 5 Lbs.). Take as directed by CHF Clinic only 01/27/22   Larey Dresser, MD  metoprolol succinate (TOPROL-XL) 100 MG 24 hr tablet Take 1 tablet (100 mg total) by mouth every morning AND 0.5 tablets (50 mg total) every evening. Take with or immediately following a meal.. 01/27/22   Larey Dresser, MD  Multiple Vitamins-Minerals (MULTI FOR HER 50+ PO) Take 1 tablet by mouth daily.     [provider]  Multiple Vitamins-Minerals (PRESERVISION AREDS 2) CAPS Take 1 capsule by mouth 2 (two) times daily.    [provider]  nitrofurantoin, macrocrystal-monohydrate, (MACROBID) 100 MG capsule Take 100 mg by mouth daily. 04/06/22   [provider]  potassium chloride SA (KLOR-CON M) 20 MEQ tablet Take 20 mEq by mouth every other day.    [provider]  rosuvastatin (CRESTOR) 5 MG tablet Take 5 mg by mouth every other day.     [provider]  torsemide (DEMADEX) 20 MG tablet TAKE 3 TABLETS BY MOUTH IN THE MORNING Patient taking differently: Take 60  mg by mouth daily. 02/16/22   Larey Dresser, MD  umeclidinium-vilanterol [facility] ELLIPTA) 62.5-25 MCG/INH AEPB Inhale 1 puff into the lungs daily.    [provider]    Physical Exam: Vitals:   06/03/22 0346 06/03/22 0430 06/03/22 0500 06/03/22 0530  BP: (!) 150/68 134/64 (!) 131/109 (!) 142/65  Pulse: 73 72 72 74  Resp: 20 18 (!) 23 (!) 21  Temp: 98 F (36.7 C)     TempSrc: Oral     SpO2: 100% 100% 99% 100%  Weight:      Height:        Constitutional: NAD, calm  Eyes: PERTLA, lids and conjunctivae normal ENMT: Mucous membranes are moist. Posterior pharynx clear of any exudate or lesions.   Neck: supple, no masses  Respiratory: Fine rales bilaterally, no wheezing. No accessory muscle use.  Cardiovascular: S1 & S2 heard, regular rate and rhythm. B/l LE edema.   Abdomen: No distension, no tenderness, soft. Bowel sounds active.  Musculoskeletal: no clubbing / cyanosis. No joint deformity upper and lower extremities.   Skin: no significant rashes, lesions, ulcers. Warm, dry, well-perfused. Neurologic: No gross facial asymmetry. Moving all extremities. Alert and oriented.  Psychiatric: Pleasant. Cooperative.    Labs and Imaging on Admission: I have personally reviewed following labs and imaging studies  CBC: Recent Labs  Lab 06/02/22 1538 06/03/22 0243  WBC 14.0*  --    HGB 9.9* 10.2*  HCT 29.1* 30.0*  MCV 87.9  --   PLT 293  --    Basic Metabolic Panel: Recent Labs  Lab 06/02/22 1538 06/03/22 0243  NA 138 137  K 5.2* 4.2  CL 100  --   CO2 28  --   GLUCOSE 262*  --   BUN 46*  --   CREATININE 1.87*  --   CALCIUM 9.7  --    GFR: Estimated Creatinine Clearance: 26.1 mL/min (A) (by C-G formula based on SCr of 1.87 mg/dL (H)). Liver Function Tests: Recent Labs  Lab 06/02/22 1538  AST 36  ALT 28  ALKPHOS 104  BILITOT 2.6*  PROT 7.4  ALBUMIN 2.6*   Recent Labs  Lab 06/02/22 1538  LIPASE 69*   No results for input(s): "AMMONIA" in the last 168 hours. Coagulation Profile: No results for input(s): "INR", "PROTIME" in the last 168 hours. Cardiac Enzymes: No results for input(s): "CKTOTAL", "CKMB", "CKMBINDEX", "TROPONINI" in the last 168 hours. BNP (last 3 results) No results for input(s): "PROBNP" in the last 8760 hours. HbA1C: No results for input(s): "HGBA1C" in the last 72 hours. CBG: No results for input(s): "GLUCAP" in the last 168 hours. Lipid Profile: No results for input(s): "CHOL", "HDL", "LDLCALC", "TRIG", "CHOLHDL", "LDLDIRECT" in the last 72 hours. Thyroid Function Tests: No results for input(s): "TSH", "T4TOTAL", "FREET4", "T3FREE", "THYROIDAB" in the last 72 hours. Anemia Panel: No results for input(s): "VITAMINB12", "FOLATE", "FERRITIN", "TIBC", "IRON", "RETICCTPCT" in the last 72 hours. Urine analysis:    Component Value Date/Time   COLORURINE YELLOW 06/03/2022 0440   APPEARANCEUR TURBID (A) 06/03/2022 0440   LABSPEC 1.012 06/03/2022 0440   PHURINE 5.0 06/03/2022 0440   GLUCOSEU NEGATIVE 06/03/2022 0440   HGBUR LARGE (A) 06/03/2022 0440   BILIRUBINUR NEGATIVE 06/03/2022 0440   KETONESUR NEGATIVE 06/03/2022 0440   PROTEINUR 100 (A) 06/03/2022 0440   UROBILINOGEN 0.2 02/02/2014 1943   NITRITE NEGATIVE 06/03/2022 0440   LEUKOCYTESUR LARGE (A) 06/03/2022 0440   Sepsis  Labs: '@LABRCNTIP'$ (procalcitonin:4,lacticidven:4) )No results found for this or  any previous visit (from the past 240 hour(s)).   Radiological Exams on Admission: CT ABDOMEN PELVIS WO CONTRAST  Result Date: 06/03/2022 CLINICAL DATA:  81 year old female with left lower quadrant abdominal pain. History of recurrent UTI and sepsis. EXAM: CT ABDOMEN AND PELVIS WITHOUT CONTRAST TECHNIQUE: Multidetector CT imaging of the abdomen and pelvis was performed following the standard protocol without IV contrast. RADIATION DOSE REDUCTION: This exam was performed according to the departmental dose-optimization program which includes automated exposure control, adjustment of the mA and/or kV according to patient size and/or use of iterative reconstruction technique. COMPARISON:  Noncontrast CT Abdomen and Pelvis 05/19/2022. FINDINGS: Lower chest: Prior sternotomy. Cardiac pacemaker leads. Mild cardiomegaly. No pericardial effusion. Increased bilateral lung base opacity since last month appears related to new trace pleural effusions and patchy bilateral lower lobe opacity with borderline consolidation. The lung base involvement is fairly symmetric. Hepatobiliary: Stable noncontrast liver and gallbladder. Pancreas: Stable, negative. Spleen: Stable, negative. Adrenals/Urinary Tract: Normal adrenal glands. Noncontrast kidneys are stable and nonobstructed. No hydronephrosis or pararenal inflammation. No convincing nephrolithiasis. No hydroureter. Urinary bladder partially obscured by hip arthroplasty streak artifact, but evidence of indistinct bladder wall inflammation at the dome which is visible on series 3, image 64. Stomach/Bowel: Extensive large bowel diverticulosis from the transverse colon distally. The the hepatic flexure and mid transverse colon are more gas distended today. But there is no transition point, with retained gas and stool in redundant distal transverse and splenic flexure. Descending colon is decompressed.  Rectosigmoid colon decompressed. Extensive diverticula but no definite active large bowel inflammation. Cecum on a lax mesentery. Diverticulosis of the terminal ileum (series 3, image 41) which is decompressed. Cecum is gas distended similar to the mid transverse colon. No cecal wall thickening. Appendix not identified. No pericecal inflammation. No free air or free fluid identified. Gas-filled but nondilated distal small bowel in the right abdomen. A small fat containing umbilical hernia appears stable and inconsequential. Negative stomach. Duodenum is decompressed. Vascular/Lymphatic: Extensive Aortoiliac calcified atherosclerosis. Vascular patency is not evaluated in the absence of IV contrast. Normal caliber abdominal aorta. No lymphadenopathy identified. Reproductive: Uterus seems to be surgically absent. Ovaries are not identified. Other: Limited visualization of the pelvis from hip arthroplasty streak artifact as before. Musculoskeletal: Chronic spine degeneration with lumbosacral junction ankylosis and osteopenia. Chronic bilateral hip arthroplasty with subsequent streak artifact in the pelvis. Prior sternotomy. No acute osseous abnormality identified. IMPRESSION: 1. Increased lung base opacification since last month appears related to trace new pleural effusions and patchy opacity. Symmetry argues in favor of atelectasis, but acute lung base infection cannot be excluded. 2. Limited CT visualization of the pelvis secondary to artifact from bilateral hip arthroplasties. But the visible urinary bladder dome appears inflamed compatible with UTI and/or Cystitis. 3. No evidence of obstructive uropathy or ascending urinary infection on this noncontrast exam. 4. Widespread severe large bowel diverticulosis, and some distal small bowel diverticulosis also, but no active inflammation identified. Increased gaseous distension of the proximal large bowel might reflect Ileus. 5. Severe Aortic Atherosclerosis  (ICD10-I70.0). Electronically Signed   By: Genevie Ann M.D.   On: 06/03/2022 04:49   DG Chest Port 1 View  Result Date: 06/03/2022 CLINICAL DATA:  Back pain.  No trauma. EXAM: PORTABLE CHEST 1 VIEW COMPARISON:  05/19/2022 FINDINGS: Cardiac pacemaker. Postoperative changes in the mediastinum. Cardiac enlargement. Mild vascular congestion. Mild interstitial changes suggesting early edema. Small left pleural effusion. No pneumothorax. Calcification of the aorta. IMPRESSION: Cardiac enlargement with mild vascular congestion and  edema. Small left pleural effusion. Electronically Signed   By: Lucienne Capers M.D.   On: 06/03/2022 02:13   DG Lumbar Spine 2-3 Views  Result Date: 06/03/2022 CLINICAL DATA:  Back pain EXAM: LUMBAR SPINE - 2-3 VIEW COMPARISON:  CT abdomen and pelvis 05/19/2022 FINDINGS: Five lumbar type vertebral bodies. Normal alignment. No acute vertebral compression deformities. No focal bone lesion or bone destruction. Degenerative changes with disc space narrowing and endplate osteophyte formation. Multilevel degenerative disc disease. Degenerative changes in the facet joints. Prominent vascular calcification in the aorta. Bilateral hip arthroplasties. IMPRESSION: Normal alignment. Degenerative changes. No acute displaced fractures identified. Electronically Signed   By: Lucienne Capers M.D.   On: 06/03/2022 02:12   DG Thoracic Spine 2 View  Result Date: 06/03/2022 CLINICAL DATA:  Back pain.  No trauma. EXAM: THORACIC SPINE 2 VIEWS COMPARISON:  Two-view chest 07/29/2019 FINDINGS: Normal alignment of the thoracic spine. Mild vertebral wedge deformities of the midthoracic region similar to prior study. No new vertebral compression is identified. Degenerative changes with narrowed disc spaces and endplate osteophyte formation throughout. No focal bone lesion or bone destruction. Bone cortex appears intact. No abnormal paraspinal soft tissue swelling. Mild thoracic scoliosis convex towards the right  is unchanged. Postoperative changes noted in the mediastinum. IMPRESSION: Normal alignment of the thoracic spine. No acute displaced fractures identified. Degenerative changes. Electronically Signed   By: Lucienne Capers M.D.   On: 06/03/2022 02:10    EKG: Independently reviewed. Ventricular-pacing.   Assessment/Plan   1. Acute on chronic combined systolic & diastolic CHF  - Presents with multiple complaints and noted to have peripheral edema, increased BNP, and CHF findings on chest imaging  - She was given 60 mg IV Lasix in ED  - Continue diuresis with IV Lasix, monitor wt and I/Os, continue beta-blocker as tolerated, update echocardiogram    2. UTI  - Complains of recurrent dysuria, has inflammatory changes involving urinary bladder on CT  - UA and culture ordered from ED and antibiotics started  - Continue antibiotics, follow culture and clinical course    3. COPD; chronic hypoxic respiratory failure  - Not in exacerbation on admission  - Continue ICS/LABA and as-needed albuterol    4. Type II DM  - Check CBGs and use SSI for now   5. Atrial fibrillation   - Continue metoprolol and Eliquis    6. CAD  - No anginal complaints  - Continue statin, beta-blocker, and Eliquis   7. CKD IV  - SCr is 1.87 on admission, appears to be her baseline  - Renally-dose medications, monitor     DVT prophylaxis: Eliquis  Code Status: Full   Level of Care: Level of care: Telemetry Cardiac Family Communication: none present  Disposition Plan:  Patient is from: home  Anticipated d/c is to:  TBD Anticipated d/c date is: 06/06/22  Patient currently: Pending improved volume status Consults called: none  Admission status: Inpatient     Vianne Bulls, MD [facility]  06/03/2022, 6:23 AM

## 2022-06-03 NOTE — Assessment & Plan Note (Signed)
Enterococcus fecalis bacteremia.  (4/4 bottles).  Wbc is 10.4   Antibiotic therapy changed to IV ampicillin Plan to follow up on repeat blood cultures Case discussed with Dr Gale Journey, plan to for TEE.  Note patient has a pacemaker defibrillator in place.

## 2022-06-03 NOTE — ED Notes (Signed)
Patient transported to X-ray 

## 2022-06-04 DIAGNOSIS — N184 Chronic kidney disease, stage 4 (severe): Secondary | ICD-10-CM | POA: Diagnosis not present

## 2022-06-04 DIAGNOSIS — N3001 Acute cystitis with hematuria: Secondary | ICD-10-CM

## 2022-06-04 DIAGNOSIS — R7881 Bacteremia: Secondary | ICD-10-CM

## 2022-06-04 DIAGNOSIS — I4821 Permanent atrial fibrillation: Secondary | ICD-10-CM | POA: Diagnosis not present

## 2022-06-04 DIAGNOSIS — I5023 Acute on chronic systolic (congestive) heart failure: Secondary | ICD-10-CM | POA: Diagnosis not present

## 2022-06-04 DIAGNOSIS — N390 Urinary tract infection, site not specified: Secondary | ICD-10-CM | POA: Diagnosis not present

## 2022-06-04 LAB — GLUCOSE, CAPILLARY
Glucose-Capillary: 171 mg/dL — ABNORMAL HIGH (ref 70–99)
Glucose-Capillary: 201 mg/dL — ABNORMAL HIGH (ref 70–99)
Glucose-Capillary: 229 mg/dL — ABNORMAL HIGH (ref 70–99)
Glucose-Capillary: 147 mg/dL — ABNORMAL HIGH (ref 70–99)

## 2022-06-04 LAB — COMPREHENSIVE METABOLIC PANEL
ALT: 27 U/L (ref 0–44)
AST: 38 U/L (ref 15–41)
Albumin: 2.2 g/dL — ABNORMAL LOW (ref 3.5–5.0)
Alkaline Phosphatase: 109 U/L (ref 38–126)
Anion gap: 13 (ref 5–15)
BUN: 50 mg/dL — ABNORMAL HIGH (ref 8–23)
CO2: 27 mmol/L (ref 22–32)
Calcium: 9.6 mg/dL (ref 8.9–10.3)
Chloride: 96 mmol/L — ABNORMAL LOW (ref 98–111)
Creatinine, Ser: 1.91 mg/dL — ABNORMAL HIGH (ref 0.44–1.00)
GFR, Estimated: 26 mL/min — ABNORMAL LOW (ref >60)
Glucose, Bld: 174 mg/dL — ABNORMAL HIGH (ref 70–99)
Potassium: 4 mmol/L (ref 3.5–5.1)
Sodium: 136 mmol/L (ref 135–145)
Total Bilirubin: 1.5 mg/dL — ABNORMAL HIGH (ref 0.3–1.2)
Total Protein: 6.4 g/dL — ABNORMAL LOW (ref 6.5–8.1)

## 2022-06-04 LAB — CBC
HCT: 27.2 % — ABNORMAL LOW (ref 36.0–46.0)
Hemoglobin: 9.2 g/dL — ABNORMAL LOW (ref 12.0–15.0)
MCH: 28.9 pg (ref 26.0–34.0)
MCHC: 33.8 g/dL (ref 30.0–36.0)
MCV: 85.5 fL (ref 80.0–100.0)
Platelets: 216 10*3/uL (ref 150–400)
RBC: 3.18 MIL/uL — ABNORMAL LOW (ref 3.87–5.11)
RDW: 15.9 % — ABNORMAL HIGH (ref 11.5–15.5)
WBC: 10.4 10*3/uL (ref 4.0–10.5)
nRBC: 0 % (ref 0.0–0.2)

## 2022-06-04 LAB — MAGNESIUM: Magnesium: 2 mg/dL (ref 1.7–2.4)

## 2022-06-04 MED ORDER — TORSEMIDE 20 MG PO TABS
40.0000 mg | ORAL_TABLET | Freq: Every day | ORAL | Status: DC
  Administered 2022-06-05: 40 mg via ORAL
  Filled 2022-06-04: qty 2

## 2022-06-04 NOTE — Consult Note (Signed)
[facility]    Date of Admission:  06/02/2022     Reason for Consult: e faecalis bacteremia    Referring Provider: Charise Carwin, Arrien   Lines:  Peripheral iv's  Abx: 10/07-c ampicillin        Assessment: E faecalis bacteremia high burden, community acquired Uti Presence of cardiac device Severe thoracic midline back pain new  81 yo female with cad, afib on eliquis, cardiopathy/chf s/p icd/pacemaker, copd, admitted 2nd time on 10/06 in the last 2 weeks for uti sx, malaise, and leg swelling, found to meet sepsis criteria in setting e faecalis bacteremia   While source likely due to uti given community acquired and also high burden, age/likely valve sclerosis, duration of symptoms, and presence of icd/pacer I would recommend full IE w/u including tee if patient is agreeable  Tte so far unremarkable  Patient also has severe lower thoracic pain which is new for about 2 weeks will need imaging to look for spine infection/soft tissue abscess there   Plan: Continue ampicillin for now Repeat blood culture TEE to be ordered this coming week Mri thoracic spine  Discussed with primary team  I spent 75 minute reviewing data/chart, and coordinating care and >50% direct face to face time providing counseling/discussing diagnostics/treatment plan with patient    ------------------------------------------------ Principal Problem:   Acute on chronic systolic CHF (congestive heart failure) (HCC) Active Problems:   Type II diabetes mellitus with complication (HCC)   Essential hypertension   Permanent atrial fibrillation (HCC) - CHA2DS2-VASc Score 6, on Eliquis   CKD (chronic kidney disease), stage IV ([place])   CAD status post CABG   Hypothyroidism   Chronic respiratory failure with hypoxia ([place])   UTI (urinary tract infection)   Class 3 obesity ([place])   Bacteremia    HPI: Elaine [name] is a 81 y.o. female with cad, afib on eliquis,  cardiopathy/chf s/p icd/pacemaker, copd, admitted 2nd time on 10/06 in the last 2 weeks for uti sx, malaise, and leg swelling, found to meet sepsis criteria in setting e faecalis bacteremia   Patient good historian and accompanied by her 2 good friends who provide hx, corroborated with chart  She was recently seen 2 weeks prior to this admission for dysuria, urgency/freqency and given cephalexin. She then developed legs swelling along with new mid back pain. In the beginning she had right sided flank pain  No subjective f/c  Due to leg swelling and ongoing uti sx and ongoing malaise she came back to hospital.  In the ed: Afebrile; hds Saturating well on 3 liters o2 (chronic o2 dependence) Wbc 14, bnp 1200 Ct abd pelv suggestive inflammatory changes urinary bladder Xray lumbar thoracic spine unremarkable Xray chest mild vascular congestion and small left pleural effusion Started on ceftriaxone  Bcx ultimately returned 2 of 2 set e faecalis Ucx also with e faecalis     Family History  Adopted: Yes  Problem Relation Age of Onset   Other Mother    Hypertension Mother    Colon cancer Neg Hx    Esophageal cancer Neg Hx    Rectal cancer Neg Hx    Stomach cancer Neg Hx     Social History   Tobacco Use   Smoking status: Former    Packs/day: 0.50    Years: 39.00    Total pack years: 19.50    Types: Cigarettes    Quit date: 08/28/1993    Years since quitting:  28.7   Smokeless tobacco: Never  Vaping Use   Vaping Use: Never used  Substance Use Topics   Alcohol use: No    Comment: quit 95   Drug use: No    Allergies  Allergen Reactions   Crestor [Rosuvastatin Calcium] Other (See Comments)    Cramps- still taking    Allopurinol Hives   Cephalosporins Rash   Tape Rash    Adhesive tape    Review of Systems: ROS All Other ROS was negative, except mentioned above   Past Medical History:  Diagnosis Date   Acute myocardial infarction of anterior wall (HCC) 10/20/2013    Afib, LBBB   Acute pulmonary edema with congestive heart failure ([place]) 10/20/2013   Resolved   AICD (automatic cardioverter/defibrillator) present    Arthritis    Atrial fibrillation ([place]) 10/20/2013   On Warfarin   CAD, multiple vessel 10/20/2013   LM, LAD & RCA --> CABG; MYOVIEW 12/'15: Intermediate Risk would large anterior, anteroapical segment the apex possible infarct and peri-infarct ischemia   CHF (congestive heart failure) (HCC)    EF 40-45%.   Chronic kidney disease    COPD (chronic obstructive pulmonary disease) (HCC)    Diabetic neuropathy (HCC)    Heart murmur    Likely related to aortic sclerosis   Hyperlipidemia    Hypertension    Hypothyroidism    Ischemic cardiomyopathy - Notable Improvement in EF post CABG 10/20/2013   Echo 2/23: EF 20-25%; mild LVH. anteroseptal Akinesis; mid-apical anterior, inferior & inferoseptal + apical-lateral akinesis; G 1 DDysfxn.; Mild-Mod LA dil; mild MR; Mod PHTN;; F/u Echo 12/2013: EF 40-45%, septal & apical HK, mild LVH; Mod LA dilation   Left bundle branch block (LBBB) on electrocardiogram    Mild mitral regurgitation by prior echocardiogram    Mild-Mod MR on Echo   Morbid obesity ([place])    OSTEOARTHRITIS 08/06/2006   S/P CABG x 3 10/23/2013   LIMA to LAD, SVG to OM2, SVG to RPLB, EVH via right thigh   Type II diabetes mellitus with complication ([place]) 84/16/6063   off rx since heart attack 12/15-dr told could stay off if keep cbg under 150   UTI (lower urinary tract infection) 09/29/2014   ongoing now. started rx 09/28/14       Scheduled Meds:  apixaban  2.5 mg Oral BID   furosemide  60 mg Intravenous Q12H   insulin aspart  0-5 Units Subcutaneous QHS   insulin aspart  0-6 Units Subcutaneous TID WC   isosorbide-hydrALAZINE  0.5 tablet Oral TID   levothyroxine  112 mcg Oral Q0600   metoprolol succinate  50 mg Oral BID   rosuvastatin  5 mg Oral QODAY   sodium chloride flush  3 mL Intravenous Q12H   umeclidinium-vilanterol  1  puff Inhalation Daily   Continuous Infusions:  ampicillin (OMNIPEN) IV 2 g (06/04/22 1040)   PRN Meds:.acetaminophen **OR** acetaminophen, ondansetron **OR** ondansetron (ZOFRAN) IV, senna-docusate   OBJECTIVE: Blood pressure 119/64, pulse 76, temperature 98.1 F (36.7 C), temperature source Oral, resp. rate 16, height '5\' 2"'$  (1.575 m), weight 100 kg, SpO2 100 %.  Physical Exam  General/constitutional: no distress, pleasant HEENT: Normocephalic, PER, chronic right conj scarring, EOMI, Oropharynx clear Neck supple CV: rrr no mrg Lungs: clear to auscultation, normal respiratory effort Abd: Soft, Nontender Ext: no edema Skin: No Rash Neuro: nonfocal MSK: exquisite lower tspine midline tenderness    Lab Results Lab Results  Component Value Date   WBC 10.4 06/04/2022  HGB 9.2 (L) 06/04/2022   HCT 27.2 (L) 06/04/2022   MCV 85.5 06/04/2022   PLT 216 06/04/2022    Lab Results  Component Value Date   CREATININE 1.91 (H) 06/04/2022   BUN 50 (H) 06/04/2022   NA 136 06/04/2022   K 4.0 06/04/2022   CL 96 (L) 06/04/2022   CO2 27 06/04/2022    Lab Results  Component Value Date   ALT 27 06/04/2022   AST 38 06/04/2022   ALKPHOS 109 06/04/2022   BILITOT 1.5 (H) 06/04/2022      Microbiology: Recent Results (from the past 240 hour(s))  Urine Culture     Status: Abnormal (Preliminary result)   Collection Time: 06/03/22  4:54 AM   Specimen: Urine, Catheterized  Result Value Ref Range Status   Specimen Description URINE, CATHETERIZED  Final   Special Requests NONE  Final   Culture (A)  Final    80,000 COLONIES/mL ENTEROCOCCUS FAECALIS SUSCEPTIBILITIES TO FOLLOW Performed at [facility], [place] [address], [place], [place] [address]    Report Status PENDING  Incomplete  Blood culture (routine x 2)     Status: Abnormal (Preliminary result)   Collection Time: 06/03/22  5:11 AM   Specimen: BLOOD  Result Value Ref Range Status   Specimen Description BLOOD RIGHT  ANTECUBITAL  Final   Special Requests   Final    BOTTLES DRAWN AEROBIC AND ANAEROBIC Blood Culture adequate volume   Culture  Setup Time   Final    GRAM POSITIVE COCCI IN CHAINS IN BOTH AEROBIC AND ANAEROBIC BOTTLES CRITICAL RESULT CALLED TO, READ BACK BY AND VERIFIED WITH: Pamala Hurry [id number] AT 2013 BY [facility]    Culture (A)  Final    ENTEROCOCCUS FAECALIS SUSCEPTIBILITIES TO FOLLOW Performed at [facility], [place] [address] [place], [place] [address]    Report Status PENDING  Incomplete  Blood Culture ID Panel (Reflexed)     Status: Abnormal   Collection Time: 06/03/22  5:11 AM  Result Value Ref Range Status   Enterococcus faecalis DETECTED (A) NOT DETECTED Final    Comment: CRITICAL RESULT CALLED TO, READ BACK BY AND VERIFIED WITH: C/  PHARMD LYDIA CHEN [id number] AT 2013 BY [facility] CRITICAL COMMENT ADDED BY A. LAFRANCE 06/03/22 2132     Enterococcus Faecium NOT DETECTED NOT DETECTED Final   Listeria monocytogenes NOT DETECTED NOT DETECTED Final   Staphylococcus species NOT DETECTED NOT DETECTED Final   Staphylococcus aureus (BCID) NOT DETECTED NOT DETECTED Final   Staphylococcus epidermidis NOT DETECTED NOT DETECTED Final   Staphylococcus lugdunensis NOT DETECTED NOT DETECTED Final   Streptococcus species NOT DETECTED NOT DETECTED Final   Streptococcus agalactiae NOT DETECTED NOT DETECTED Final   Streptococcus pneumoniae NOT DETECTED NOT DETECTED Final   Streptococcus pyogenes NOT DETECTED NOT DETECTED Final   A.calcoaceticus-baumannii NOT DETECTED NOT DETECTED Final   Bacteroides fragilis NOT DETECTED NOT DETECTED Final   Enterobacterales NOT DETECTED NOT DETECTED Final   Enterobacter cloacae complex NOT DETECTED NOT DETECTED Final   Escherichia coli NOT DETECTED NOT DETECTED Final   Klebsiella aerogenes NOT DETECTED NOT DETECTED Final   Klebsiella oxytoca NOT DETECTED NOT DETECTED Final   Klebsiella pneumoniae NOT DETECTED NOT DETECTED Final   Proteus species NOT  DETECTED NOT DETECTED Final   Salmonella species NOT DETECTED NOT DETECTED Final   Serratia marcescens NOT DETECTED NOT DETECTED Final   Haemophilus influenzae NOT DETECTED NOT DETECTED Final   Neisseria meningitidis NOT DETECTED NOT  DETECTED Final   Pseudomonas aeruginosa NOT DETECTED NOT DETECTED Final   Stenotrophomonas maltophilia NOT DETECTED NOT DETECTED Final   Candida albicans NOT DETECTED NOT DETECTED Final   Candida auris NOT DETECTED NOT DETECTED Final   Candida glabrata NOT DETECTED NOT DETECTED Final   Candida krusei NOT DETECTED NOT DETECTED Final   Candida parapsilosis NOT DETECTED NOT DETECTED Final   Candida tropicalis NOT DETECTED NOT DETECTED Final   Cryptococcus neoformans/gattii NOT DETECTED NOT DETECTED Final   Vancomycin resistance NOT DETECTED NOT DETECTED Final    Comment: Performed at [facility], [place] [address] [place], [place] [address]  Blood culture (routine x 2)     Status: Abnormal (Preliminary result)   Collection Time: 06/03/22  5:15 AM   Specimen: BLOOD  Result Value Ref Range Status   Specimen Description BLOOD SITE NOT SPECIFIED  Final   Special Requests   Final    BOTTLES DRAWN AEROBIC AND ANAEROBIC Blood Culture adequate volume   Culture  Setup Time   Final    GRAM POSITIVE COCCI IN CHAINS IN BOTH AEROBIC AND ANAEROBIC BOTTLES CRITICAL VALUE NOTED.  VALUE IS CONSISTENT WITH PREVIOUSLY REPORTED AND CALLED VALUE. Performed at [facility], [place] [address], [place], [place] [address]    Culture ENTEROCOCCUS FAECALIS (A)  Final   Report Status PENDING  Incomplete     Serology:    Imaging: If present, new imagings (plain films, ct scans, and mri) have been personally visualized and interpreted; radiology reports have been reviewed. Decision making incorporated into the Impression / Recommendations.  10/7 ct abd pelv 1. Increased lung base opacification since last month appears related to trace new pleural effusions and patchy  opacity. Symmetry argues in favor of atelectasis, but acute lung base infection cannot be excluded.   2. Limited CT visualization of the pelvis secondary to artifact from bilateral hip arthroplasties. But the visible urinary bladder dome appears inflamed compatible with UTI and/or Cystitis.   3. No evidence of obstructive uropathy or ascending urinary infection on this noncontrast exam.   4. Widespread severe large bowel diverticulosis, and some distal small bowel diverticulosis also, but no active inflammation identified. Increased gaseous distension of the proximal large bowel might reflect Ileus.   5. Severe Aortic Atherosclerosis    10/7 cxr Cardiac enlargement with mild vascular congestion and edema. Small left pleural effusion.    Jabier Mutton, [facility] [phone] pager    06/04/2022, 11:42 AM

## 2022-06-04 NOTE — Progress Notes (Addendum)
Progress Note   Patient: Elaine [name] VPX:[id number] DOB: 1940/12/08 DOA: 06/02/2022     1 DOS: the patient was seen and examined on 06/04/2022   Brief hospital course: Mr. Sadek was admitted to the hospital with the working diagnosis of decompensated heart failure.   81 yo female with the past medical history of coronary artery disease, atrial fibrillation, COPD, chronic hypoxemic respiratory failure and systolic heart failure who presented with dyspnea and edema. Reported worsening dyspnea and lower extremity edema at home, positive dysuria and back pain. On his initial physical examination his blood pressure was 150/68, HR 73, RR 20 and 02 saturation 99% on 3 L/min per [place], lungs with rales bilaterally, with no wheezing, heart with S1 and S2 present and rhythmic, abdomen with no distention and positive lower extremity edema.   Na 138, K 5,2 CL 100 bicarbonate 28 glucose 262 bun 46 cr 1,87  BNP 1,191  Wbc 14,0 hgb 9,9 plt 293   Chest radiograph with cardiomegaly, no infiltrates, small left pleural effusion, pacemaker with one ventricular lead and one defibrillator lead in place.   CT abdomen with inflammation at the urinary bladder, positive diverticulosis with no diverticulitis.   EKG 73 bpm, right axis deviation, qtc not prolonged, ventricular paced rhythm with underlying atrial fibrillation, no significant ST segment or T wave changes.   Patient was placed on IV furosemide and IV antibiotic therapy .   Blood culture positive for enterococcus, antibiotic therapy was changed to IV ampicillin ID consultation has recommended TEE.   Assessment and Plan: * Acute on chronic systolic CHF (congestive heart failure) (HCC) Echocardiogram with reduced LV systolic function with EF 25 to 30%, global hypokinesis, mild dyskinesis of the left ventricular entire apical segment. Moderate reduction in RV systolic function, RVSP 46,2 mmHg. Moderate to severe TR   Systolic blood pressure 703 to 119 mmHg.    Continue with bidil and metoprolol.  Her volume status has improved and will transition to oral diuretic therapy tomorrow.  No SGLT 2 inh due to frequent urinary tract infections.   UTI (urinary tract infection) Enterococcus fecalis bacteremia.  (4/4 bottles).  Wbc is 10.4   Antibiotic therapy changed to IV ampicillin Plan to follow up on repeat blood cultures Case discussed with Dr Gale Journey, plan to for TEE.  Note patient has a pacemaker defibrillator in place.   Permanent atrial fibrillation (HCC) - CHA2DS2-VASc Score 6, on Eliquis Continue reate control with metoprolol Anticoagulation with apixaban.   CKD (chronic kidney disease), stage IV ([place]) Renal function with serum cr at 1,91 with K at 4,0 and serum bicarbonate at 27. Plan to transition to oral furosemide and follow up renal function in am, avoid hypotension and nephrotoxic medications.   CAD status post CABG Patient with no chest pain.   Essential hypertension Continue blood pressure control with metoprolol and bidil Diuresis with furosemide.   Type 2 diabetes mellitus with hyperlipidemia (HCC) Continue insulin sliding scale for glucose cover and monitoring,  Continue with rosuvastatin.   Chronic respiratory failure with hypoxia (HCC) COPD with no signs of exacerbation Continue bronchodilator therapy, oxymetry monitoring and supplemental 02 per [place] to keep 02 saturation 88% or greater.   Hypothyroidism Continue with levothyroxine   Class 3 obesity (HCC) Calculated BMI 40,2         Subjective: Patient with improvement in dyspnea, back pain is improved but not back to baseline, no chest pain.   Physical Exam: Vitals:   06/04/22 0600 06/04/22 0651 06/04/22 0959 06/04/22  1040  BP:      Pulse:   76 73  Resp:   16   Temp:  98.1 F (36.7 C)    TempSrc:  Oral    SpO2:   100% 98%  Weight: 100 kg     Height:       Neurology awake and alert ENT with mild pallor Cardiovascular with S1 and S2 present, with  positive murmur at the left lower sternal border Respiratory with no rales or wheezing on anterior auscultation  Abdomen with no distention  Trace non pitting lower extremity edema  Data Reviewed:    Family Communication: no family at the bedside   Disposition: Status is: Inpatient Remains inpatient appropriate because: heart failure   Planned Discharge Destination: Home    Author: Tawni Millers, MD 06/04/2022 2:52 PM  For on call review [email or web address].

## 2022-06-05 ENCOUNTER — Inpatient Hospital Stay (HOSPITAL_COMMUNITY): Payer: Medicare Other

## 2022-06-05 DIAGNOSIS — E1169 Type 2 diabetes mellitus with other specified complication: Secondary | ICD-10-CM | POA: Diagnosis not present

## 2022-06-05 DIAGNOSIS — I5023 Acute on chronic systolic (congestive) heart failure: Secondary | ICD-10-CM | POA: Diagnosis not present

## 2022-06-05 DIAGNOSIS — N390 Urinary tract infection, site not specified: Secondary | ICD-10-CM | POA: Diagnosis not present

## 2022-06-05 DIAGNOSIS — I2510 Atherosclerotic heart disease of native coronary artery without angina pectoris: Secondary | ICD-10-CM | POA: Diagnosis not present

## 2022-06-05 DIAGNOSIS — E785 Hyperlipidemia, unspecified: Secondary | ICD-10-CM

## 2022-06-05 LAB — CULTURE, BLOOD (ROUTINE X 2)
Special Requests: ADEQUATE
Special Requests: ADEQUATE

## 2022-06-05 LAB — CBC
HCT: 25.1 % — ABNORMAL LOW (ref 36.0–46.0)
Hemoglobin: 8.4 g/dL — ABNORMAL LOW (ref 12.0–15.0)
MCH: 28.8 pg (ref 26.0–34.0)
MCHC: 33.5 g/dL (ref 30.0–36.0)
MCV: 86 fL (ref 80.0–100.0)
Platelets: 206 10*3/uL (ref 150–400)
RBC: 2.92 MIL/uL — ABNORMAL LOW (ref 3.87–5.11)
RDW: 15.9 % — ABNORMAL HIGH (ref 11.5–15.5)
WBC: 9.3 10*3/uL (ref 4.0–10.5)
nRBC: 0.2 % (ref 0.0–0.2)

## 2022-06-05 LAB — COMPREHENSIVE METABOLIC PANEL
ALT: 26 U/L (ref 0–44)
AST: 28 U/L (ref 15–41)
Albumin: 1.9 g/dL — ABNORMAL LOW (ref 3.5–5.0)
Alkaline Phosphatase: 97 U/L (ref 38–126)
Anion gap: 11 (ref 5–15)
BUN: 62 mg/dL — ABNORMAL HIGH (ref 8–23)
CO2: 24 mmol/L (ref 22–32)
Calcium: 8.5 mg/dL — ABNORMAL LOW (ref 8.9–10.3)
Chloride: 99 mmol/L (ref 98–111)
Creatinine, Ser: 2.25 mg/dL — ABNORMAL HIGH (ref 0.44–1.00)
GFR, Estimated: 21 mL/min — ABNORMAL LOW (ref >60)
Glucose, Bld: 156 mg/dL — ABNORMAL HIGH (ref 70–99)
Potassium: 4.1 mmol/L (ref 3.5–5.1)
Sodium: 134 mmol/L — ABNORMAL LOW (ref 135–145)
Total Bilirubin: 1.1 mg/dL (ref 0.3–1.2)
Total Protein: 6.1 g/dL — ABNORMAL LOW (ref 6.5–8.1)

## 2022-06-05 LAB — URINE CULTURE: Culture: 80000 — AB

## 2022-06-05 LAB — GLUCOSE, CAPILLARY
Glucose-Capillary: 144 mg/dL — ABNORMAL HIGH (ref 70–99)
Glucose-Capillary: 255 mg/dL — ABNORMAL HIGH (ref 70–99)
Glucose-Capillary: 183 mg/dL — ABNORMAL HIGH (ref 70–99)
Glucose-Capillary: 258 mg/dL — ABNORMAL HIGH (ref 70–99)

## 2022-06-05 MED ORDER — SODIUM CHLORIDE 0.9 % IV SOLN
2.0000 g | Freq: Three times a day (TID) | INTRAVENOUS | Status: DC
  Administered 2022-06-05 – 2022-06-10 (×16): 2 g via INTRAVENOUS
  Filled 2022-06-05 (×17): qty 2000

## 2022-06-05 MED ORDER — TORSEMIDE 20 MG PO TABS: 60.0000 mg | ORAL_TABLET | Freq: Every day | ORAL | Status: DC

## 2022-06-05 NOTE — Consult Note (Addendum)
Advanced Heart Failure Team Consult Note   Primary Physician: Ginger Organ., MD PCP-Cardiologist:  Glenetta Hew, MD  Reason for Consultation: acute/chronic systolic heart failure  HPI:    Elaine [name] is seen today for evaluation of acute/chronic systolic heart failure at the request of Dr. Cathlean Sauer, internal medicine.   Elaine [name] is a 81 y.o. with history of CAD s/p CABG, COPD, CKD stage 3, and chronic systolic systolic CHF/ischemic cardiomyopathy.  Patient was admitted in 2/15 with atrial fibrillation/RVR and chest pain.  Cath showed severe 3VD and she had CABG x 3. Echo in 2/15 showed EF down to 20-25%. In 2018, it appears that she went back into atrial fibrillation and has remained in atrial fibrillation persistently.  DCCV was not attempted.  Most recent echo in 10/20 showed EF < 20% with normal RV.  She had a St Jude CRT-D device implanted in 11/20. She says that she feels "90%" better with CRT.  She is followed by Dr. Hollie Salk with nephrology.    She has been in atrial fibrillation persistently, previously attempted DCCV while on amiodarone but she did not convert.  Therefore, no amiodarone.     Echo in 3/21 showed EF 20% with diffuse hypokinesis, mildly dilated RV with mildly decreased systolic function, PASP 49 mmHg, dilated IVC.  In 9/21, she had AV nodal ablation.     She stopped empagliflozin due to UTIs and yeast infections.    Echo 12/22 EF 20-25%, mildly decreased RV function.    Presented to the ED w/ h/o recurrent UTI's and new worsening back pain. In ED BNP 1191, CXR: cardiomegaly with mild vascular congestion, edema, and small left pleural effusion., CTA/P notable for inflammatory changes involving the urinary bladder. Has noticed increased swelling in her legs the last couple of days. Intt SOB, uses inogen more frequently during the night but doesn't need it during the day. On 3L [place].   On assessment patient in chair. No SOB at rest. Denies CP.   Echo 10/23   EF 25 to 30%, global hypokinesis, mild dyskinesis of the left ventricular entire apical segment. Moderate reduction in RV systolic function, RVSP 16.1 mmHg. Moderate to severe TR    Home Medications Prior to Admission medications   Medication Sig Start Date End Date Taking? Authorizing Provider  acetaminophen (TYLENOL) 650 MG CR tablet Take 1,300 mg by mouth 2 (two) times daily.   Yes [provider]  albuterol (VENTOLIN HFA) 108 (90 Base) MCG/ACT inhaler Inhale 2 puffs into the lungs every 6 (six) hours as needed for wheezing or shortness of breath.   Yes [provider]  apixaban (ELIQUIS) 2.5 MG TABS tablet Take 2.5 mg by mouth 2 (two) times daily.   Yes [provider]  Coenzyme Q10 (COQ-10) 100 MG CAPS Take 100 mg by mouth in the morning and at bedtime.    Yes [provider]  colchicine 0.6 MG tablet Take 0.6 mg by mouth daily as needed (Gout).    Yes [provider]  Cranberry 250 MG TABS Take 500 mg by mouth 2 (two) times daily.   Yes [provider]  Dulaglutide (TRULICITY [place]) Inject 1.5 mg into the skin once a week. Tuesday   Yes [provider]  ELDERBERRY PO Take 1 tablet by mouth 2 (two) times daily.   Yes [provider]  ezetimibe (ZETIA) 10 MG tablet Take 10 mg by mouth daily.   Yes [provider]  ferrous  sulfate 325 (65 FE) MG tablet Take 325 mg by mouth every other day.   Yes [provider]  HYDROcodone-acetaminophen (NORCO/VICODIN) 5-325 MG tablet Take 1 tablet by mouth every 6 (six) hours as needed for moderate pain. 04/13/22 04/13/23 Yes Mcarthur Rossetti, MD  isosorbide-hydrALAZINE (BIDIL) 20-37.5 MG tablet Take 0.5 tablets by mouth 3 (three) times daily. 12/15/21  Yes Larey Dresser, MD  levothyroxine (SYNTHROID) 112 MCG tablet Take 1 tablet (112 mcg total) by mouth daily before breakfast. 02/05/14  Yes Donnajean Lopes, MD  metolazone (ZAROXOLYN) 2.5 MG tablet Take as directed  by CHF Clinic only Patient taking differently: Take 2.5 mg by mouth daily as needed (CHF excessive edema of over 5 Lbs.). Take as directed by CHF Clinic only 01/27/22  Yes Larey Dresser, MD  metoprolol succinate (TOPROL-XL) 100 MG 24 hr tablet Take 1 tablet (100 mg total) by mouth every morning AND 0.5 tablets (50 mg total) every evening. Take with or immediately following a meal.. 01/27/22  Yes Larey Dresser, MD  Multiple Vitamins-Minerals (MULTI FOR HER 50+ PO) Take 1 tablet by mouth daily.   Yes [provider]  Multiple Vitamins-Minerals (PRESERVISION AREDS 2) CAPS Take 1 capsule by mouth 2 (two) times daily.   Yes [provider]  nitrofurantoin, macrocrystal-monohydrate, (MACROBID) 100 MG capsule Take 100 mg by mouth daily. 04/06/22  Yes [provider]  potassium chloride SA (KLOR-CON M) 20 MEQ tablet Take 20 mEq by mouth every other day.   Yes [provider]  rosuvastatin (CRESTOR) 5 MG tablet Take 5 mg by mouth every other day.    Yes [provider]  torsemide (DEMADEX) 20 MG tablet TAKE 3 TABLETS BY MOUTH IN THE MORNING Patient taking differently: Take 60 mg by mouth daily. 02/16/22  Yes Larey Dresser, MD  umeclidinium-vilanterol [facility] ELLIPTA) 62.5-25 MCG/INH AEPB Inhale 1 puff into the lungs daily.   Yes [provider]    Past Medical History: Past Medical History:  Diagnosis Date   Acute myocardial infarction of anterior wall ([place]) 10/20/2013   Afib, LBBB   Acute pulmonary edema with congestive heart failure ([place]) 10/20/2013   Resolved   AICD (automatic cardioverter/defibrillator) present    Arthritis    Atrial fibrillation ([place]) 10/20/2013   On Warfarin   CAD, multiple vessel 10/20/2013   LM, LAD & RCA --> CABG; MYOVIEW 12/'15: Intermediate Risk would large anterior, anteroapical segment the apex possible infarct and peri-infarct ischemia   CHF (congestive heart failure) (HCC)    EF 40-45%.   Chronic kidney disease     COPD (chronic obstructive pulmonary disease) (HCC)    Diabetic neuropathy (HCC)    Heart murmur    Likely related to aortic sclerosis   Hyperlipidemia    Hypertension    Hypothyroidism    Ischemic cardiomyopathy - Notable Improvement in EF post CABG 10/20/2013   Echo 2/23: EF 20-25%; mild LVH. anteroseptal Akinesis; mid-apical anterior, inferior & inferoseptal + apical-lateral akinesis; G 1 DDysfxn.; Mild-Mod LA dil; mild MR; Mod PHTN;; F/u Echo 12/2013: EF 40-45%, septal & apical HK, mild LVH; Mod LA dilation   Left bundle branch block (LBBB) on electrocardiogram    Mild mitral regurgitation by prior echocardiogram    Mild-Mod MR on Echo   Morbid obesity ([place])    OSTEOARTHRITIS 08/06/2006   S/P CABG x 3 10/23/2013   LIMA to LAD, SVG to OM2, SVG to RPLB, EVH via right thigh   Type II diabetes  mellitus with complication ([place]) 40/03/6760   off rx since heart attack 12/15-dr told could stay off if keep cbg under 150   UTI (lower urinary tract infection) 09/29/2014   ongoing now. started rx 09/28/14    Past Surgical History: Past Surgical History:  Procedure Laterality Date   ABDOMINAL HYSTERECTOMY     AV NODE ABLATION N/A 05/19/2020   Procedure: AV NODE ABLATION;  Surgeon: Constance Haw, MD;  Location: [place] CV LAB;  Service: Cardiovascular;  Laterality: N/A;   BIV ICD INSERTION CRT-D N/A 07/28/2019   Procedure: BIV ICD INSERTION CRT-D;  Surgeon: Constance Haw, MD;  Location: [place] [facility];  Service: Cardiovascular;  Laterality: N/A;   BREAST BIOPSY Right 08/04/2016    fibroadenoma with calcifications    BREAST BIOPSY Right 07/16/2014   fibroadenoma with calcifications and atypical   BREAST EXCISIONAL BIOPSY Right 10/06/2014   atypical lobular hyperplasia    BREAST LUMPECTOMY W/ NEEDLE LOCALIZATION Right 10/06/2014   Dr Georgette Dover   BREAST LUMPECTOMY WITH NEEDLE LOCALIZATION Right 10/06/2014   Procedure: RIGHT BREAST LUMPECTOMY WITH NEEDLE LOCALIZATION;  Surgeon:  Donnie Mesa, MD;  Location: [place];  Service: General;  Laterality: Right;   CARDIOVERSION N/A 10/22/2019   Procedure: CARDIOVERSION;  Surgeon: Larey Dresser, MD;  Location: [facility] ENDOSCOPY;  Service: Cardiovascular;  Laterality: N/A;   CARPAL TUNNEL RELEASE Right 04/13/2022   Procedure: RIGHT OPEN CARPAL TUNNEL RELEASE;  Surgeon: Mcarthur Rossetti, MD;  Location: [place];  Service: Orthopedics;  Laterality: Right;   CLIPPING OF ATRIAL APPENDAGE N/A 10/23/2013   Procedure: CLIPPING OF ATRIAL APPENDAGE;  Surgeon: Rexene Alberts, MD;  Location: [place];  Service: Open Heart Surgery;  Laterality: N/A;   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   CORONARY ARTERY BYPASS GRAFT N/A 10/23/2013   Procedure: CORONARY ARTERY BYPASS GRAFTING (CABG);  Surgeon: Rexene Alberts, MD;  Location: [place];  Service: Open Heart Surgery: LIMA-LAD, SVG-OM2, SVG-RPL   INTRAOPERATIVE TRANSESOPHAGEAL ECHOCARDIOGRAM N/A 10/23/2013   Procedure: INTRAOPERATIVE TRANSESOPHAGEAL ECHOCARDIOGRAM;  Surgeon: Rexene Alberts, MD;  Location: [place];  Service: Open Heart Surgery;  Laterality: N/A;   LEFT HEART CATHETERIZATION WITH CORONARY ANGIOGRAM N/A 10/20/2013   Procedure: LEFT HEART CATHETERIZATION WITH CORONARY ANGIOGRAM;  Surgeon: Leonie Man, MD;  Location: [facility] CATH LAB;  Service: Cardiovascular: Distal LM ~70%, LAD - ostial 70%, prox 80, 95 & 95%; RCA prox 70%, mid 80%; minimal Cx disease   LEFT HEART CATHETERIZATION WITH CORONARY/GRAFT ANGIOGRAM N/A 09/08/2014   Procedure: LEFT HEART CATHETERIZATION WITH Beatrix Fetters;  Surgeon: Leonie Man, MD;  Location: [facility] CATH LAB: Indication: Abnormal Myoview. Occluded LAD after 70 and 80% left main. Diffuse RCA disease. Widely patent grafts to Moderately diseased artery vessels. Mild-moderate LV dysfunction and chronic A. fib.   NM MYOVIEW LTD  08/14/2014   Lexi scan: INTERMEDIATE RISK - large, severe intensity perfusion defect in anterior wall, apex and inferior apex that is  partly reversible. This suggests either scar or possible hibernating myocardium. Nondeviated.-> Likely scar. No change in coronary anatomy with patent grafts.   TOTAL HIP ARTHROPLASTY  2010, 2011   right 2010, left 2011   TRANSTHORACIC ECHOCARDIOGRAM  12/2013   Mildly dilated left ventricle with mild to moderately reduced function. EF 40-45% (notable improvement from February 2015). Septal and apical hypokinesis. Mild LA dilation.   TRANSTHORACIC ECHOCARDIOGRAM  11/11/2019   EF 20% with severely dysfunction. Global HK. Unable to determine diastolic pressures. Mild RV  function-with mildly elevated PAP-RAP estimated 15 mmHg.. Moderate LA dilation, mild RA dilation.    Family History: Family History  Adopted: Yes  Problem Relation Age of Onset   Other Mother    Hypertension Mother    Colon cancer Neg Hx    Esophageal cancer Neg Hx    Rectal cancer Neg Hx    Stomach cancer Neg Hx     Social History: Social History   Socioeconomic History   Marital status: Widowed    Spouse name: Not on file   Number of children: Not on file   Years of education: Not on file   Highest education level: Not on file  Occupational History   Occupation: retired  Tobacco Use   Smoking status: Former    Packs/day: 0.50    Years: 39.00    Total pack years: 19.50    Types: Cigarettes    Quit date: 08/28/1993    Years since quitting: 28.7   Smokeless tobacco: Never  Vaping Use   Vaping Use: Never used  Substance and Sexual Activity   Alcohol use: No    Comment: quit 95   Drug use: No   Sexual activity: Not on file  Other Topics Concern   Not on file  Social History Narrative   Lives with husband and son in [place].  She is currently retired.   Former smoker quit in 1995.   Social Determinants of Health   Financial Resource Strain: Medium Risk (11/25/2019)   Overall Financial Resource Strain (CARDIA)    Difficulty of Paying Living Expenses: Somewhat hard  Food Insecurity: No Food Insecurity  (11/25/2019)   Hunger Vital Sign    Worried About Running Out of Food in the Last Year: Never true    Ran Out of Food in the Last Year: Never true  Transportation Needs: No Transportation Needs (11/25/2019)   PRAPARE - Hydrologist (Medical): No    Lack of Transportation (Non-Medical): No  Physical Activity: Not on file  Stress: Not on file  Social Connections: Not on file    Allergies:  Allergies  Allergen Reactions   Crestor [Rosuvastatin Calcium] Other (See Comments)    Cramps- still taking    Allopurinol Hives   Cephalosporins Rash   Tape Rash    Adhesive tape    Objective:    Vital Signs:   Temp:  [98 F (36.7 C)-98.9 F (37.2 C)] 98.1 F (36.7 C) (10/09 0725) Pulse Rate:  [72-74] 73 (10/09 0833) Resp:  [15-19] 16 (10/09 0524) BP: (99-125)/(49-61) 118/57 (10/09 0833) SpO2:  [92 %-100 %] 100 % (10/09 0823) Weight:  [103.5 kg] 103.5 kg (10/09 0524) Last BM Date : 06/01/22  Weight change: Filed Weights   06/02/22 1517 06/04/22 0600 06/05/22 0524  Weight: 99.8 kg 100 kg 103.5 kg    Intake/Output:   Intake/Output Summary (Last 24 hours) at 06/05/2022 1047 Last data filed at 06/04/2022 2209 Gross per 24 hour  Intake 160 ml  Output 500 ml  Net -340 ml      Physical Exam    General:  elderly appearing.  No respiratory difficulty HEENT: normal Neck: supple. JVD ~7 cm. Carotids 2+ bilat; no bruits. No lymphadenopathy or thyromegaly appreciated. Cor: PMI nondisplaced. Regular rate & rhythm. No rubs, gallops or murmurs. Lungs: clear, diminished bases Abdomen: soft, nontender, nondistended. No hepatosplenomegaly. No bruits or masses. Good bowel sounds. Extremities: no cyanosis, clubbing, rash, +1 BLE edema  Neuro: alert & oriented x  3, cranial nerves grossly intact. moves all 4 extremities w/o difficulty. Affect pleasant.   Telemetry   V-paced 70s, 0-1PVCs/min (Personally reviewed)    EKG    V paced 73bpm  Labs   Basic  Metabolic Panel: Recent Labs  Lab 06/02/22 1538 06/03/22 0243 06/03/22 0740 06/04/22 0705 06/05/22 0242  NA 138 137 138 136 134*  K 5.2* 4.2 4.4 4.0 4.1  CL 100  --  99 96* 99  CO2 28  --  '26 27 24  '$ GLUCOSE 262*  --  182* 174* 156*  BUN 46*  --  51* 50* 62*  CREATININE 1.87*  --  1.87* 1.91* 2.25*  CALCIUM 9.7  --  9.6 9.6 8.5*  MG  --   --   --  2.0  --     Liver Function Tests: Recent Labs  Lab 06/02/22 1538 06/03/22 0740 06/04/22 0705 06/05/22 0242  AST 36 31 38 28  ALT '28 27 27 26  '$ ALKPHOS 104 101 109 97  BILITOT 2.6* 2.0* 1.5* 1.1  PROT 7.4 6.9 6.4* 6.1*  ALBUMIN 2.6* 2.4* 2.2* 1.9*   Recent Labs  Lab 06/02/22 1538  LIPASE 69*   No results for input(s): "AMMONIA" in the last 168 hours.  CBC: Recent Labs  Lab 06/02/22 1538 06/03/22 0243 06/03/22 0740 06/04/22 0705 06/05/22 0242  WBC 14.0*  --  12.7* 10.4 9.3  HGB 9.9* 10.2* 9.3* 9.2* 8.4*  HCT 29.1* 30.0* 28.6* 27.2* 25.1*  MCV 87.9  --  89.1 85.5 86.0  PLT 293  --  243 216 206    Cardiac Enzymes: No results for input(s): "CKTOTAL", "CKMB", "CKMBINDEX", "TROPONINI" in the last 168 hours.  BNP: BNP (last 3 results) Recent Labs    10/26/21 1056 11/29/21 1057 06/03/22 0228  BNP 479.3* 396.3* 1,191.1*    ProBNP (last 3 results) No results for input(s): "PROBNP" in the last 8760 hours.   CBG: Recent Labs  Lab 06/04/22 0813 06/04/22 1243 06/04/22 1636 06/04/22 2214 06/05/22 0724  GLUCAP 171* 201* 229* 147* 144*    Coagulation Studies: No results for input(s): "LABPROT", "INR" in the last 72 hours.   Imaging   No results found.   Medications:     Current Medications:  apixaban  2.5 mg Oral BID   insulin aspart  0-5 Units Subcutaneous QHS   insulin aspart  0-6 Units Subcutaneous TID WC   isosorbide-hydrALAZINE  0.5 tablet Oral TID   levothyroxine  112 mcg Oral Q0600   metoprolol succinate  50 mg Oral BID   rosuvastatin  5 mg Oral QODAY   sodium chloride flush  3 mL  Intravenous Q12H   torsemide  40 mg Oral Daily   umeclidinium-vilanterol  1 puff Inhalation Daily    Infusions:  ampicillin (OMNIPEN) IV        Patient Profile   DAYSIA VANDENBOOM is a 81 y.o. female with history of CAD s/p CABG, COPD, CKD stage 3, and chronic systolic systolic CHF/ischemic cardiomyopathy. AHF team asked to see for acute/chronic systolic systolic CHF/ICM.  Assessment/Plan   1. Acute on chronic systolic CHF: Ischemic cardiomyopathy. - St Jude CRT-D device, now s/p AV nodal ablation.  Echo in 3/21 showed EF 20% with diffuse hypokinesis, mildly dilated RV with mildly decreased systolic function, PASP 49 mmHg, dilated IVC. Echo 12/22 EF 20-25%, mildly decreased RV function.   - Echo 10/23  EF 25 to 30%, global hypokinesis, mild dyskinesis of the left ventricular entire apical segment. Moderate  reduction in RV systolic function, RVSP 70.4 mmHg. Moderate to severe TR  - Presented NYHA III-IIIb. Limited d/t peripheral neuropathy - Volume status stable now, given IV lasix in ED and during admission, now transitioned to PO. Has dependent edema. Will attempt to get [facility] reading - Increase torsemide to home dose '60mg'$  daily - Unable to tolerate Bidil 1 tab tid, continue Bidil 1/2 tab tid  - On Toprol XL 100 qam/50 qpm at home, '50mg'$  BID here - Off empagliflozin due to yeast infection and UTI.  - Off spironolactone with side effects.  - TEE scheduled for tomorrow per ID recs w/ E faecalis bacteremia high burden - Place UNNA boots - Strict I&O, daily weights  2. CAD: S/p CABG in 2015. No chest pain.   - No ASA given apixaban use.  - Continue Crestor 5 daily + Zetia, good lipids 12/22  3. Atrial fibrillation: Chronic now, looks like it has gone on since 2018.  Unable to cardiovert her on amiodarone so it was stopped.  At this point Afib chronic. She is now s/p AVN ablation to allow more BiV pacing. Rate controlled. V-paced. - Continue apixaban.   4. CKD: Stage 4: SCr baseline  2.3-2.5.  - Cr 2.25 today, stable - avoid hypotension  5. UTI/back pain/bacteremia - per primary on abx - ID following - E faecalis bacteremia high burden, TEE scheduled for tomorrow - MRI back/spine pending  6. DM  - A1c 6.4 - SSI per primary  7. Chronic respiratory failure with hypoxia - No signs of exacerbation   Length of Stay: [place], AGACNP-BC  06/05/2022, 10:47 AM  Advanced Heart Failure Team Pager [phone] (M-F; 7a - 5p)  Please contact [facility] for night-coverage after hours (4p -7a ) and weekends on [email or web address]

## 2022-06-05 NOTE — Progress Notes (Signed)
PHARMACY ANTIBIOTIC CONSULT NOTE   Elaine [name] a 81 y.o. female with E faecalis bacteremia 2/2 urinary source . Patient has pacemaker in place with severe TV regurgitation noted on TTE, not present on previous study.   Pharmacy has been consulted for ampicillin dosing.   Given patient is obese and with bacteremia will increase frequency of ampicillin dosing to upper end of range permitted by renal function.   10/9: Scr 2.25, LA 1.7,  WBC 9.3   Estimated Creatinine Clearance: 22.1 mL/min (A) (by C-G formula based on SCr of 2.25 mg/dL (H)).  Plan: INCREASE frequency of ampicillin to 2g IV Q8H F/U C/S, TEE scheduled for 06/06/22, renal function   Allergies:  Allergies  Allergen Reactions   Crestor [Rosuvastatin Calcium] Other (See Comments)    Cramps- still taking    Allopurinol Hives   Cephalosporins Rash   Tape Rash    Adhesive tape    Filed Weights   06/02/22 1517 06/04/22 0600 06/05/22 0524  Weight: 99.8 kg (220 lb) 100 kg (220 lb 7.4 oz) 103.5 kg (228 lb 2.8 oz)       Latest Ref Rng & Units 06/05/2022    2:42 AM 06/04/2022    7:05 AM 06/03/2022    7:40 AM  CBC  WBC 4.0 - 10.5 K/uL 9.3  10.4  12.7   Hemoglobin 12.0 - 15.0 g/dL 8.4  9.2  9.3   Hematocrit 36.0 - 46.0 % 25.1  27.2  28.6   Platelets 150 - 400 K/uL 206  216  243     Antibiotics Given (last 72 hours)     Date/Time Action Medication Dose Rate   06/03/22 0531 New Bag/Given   cefTRIAXone (ROCEPHIN) 2 g in sodium chloride 0.9 % 100 mL IVPB 2 g 200 mL/hr   06/03/22 2252 Given   cephALEXin (KEFLEX) capsule 250 mg 250 mg    06/04/22 0032 New Bag/Given   ampicillin (OMNIPEN) 2 g in sodium chloride 0.9 % 100 mL IVPB 2 g 300 mL/hr   06/04/22 1040 New Bag/Given   ampicillin (OMNIPEN) 2 g in sodium chloride 0.9 % 100 mL IVPB 2 g 300 mL/hr   06/04/22 2209 New Bag/Given   ampicillin (OMNIPEN) 2 g in sodium chloride 0.9 % 100 mL IVPB 2 g 300 mL/hr   06/05/22 0840 New Bag/Given   ampicillin (OMNIPEN) 2 g in sodium  chloride 0.9 % 100 mL IVPB 2 g 300 mL/hr       Antimicrobials this admission: Ampicillin 10/8>>c (adjusted from 2g IV Q12H to 2g IV Q8H on 06/05/22)   Microbiology results: 10/7 Bcx:4/4 e faecalis  10/7 Ucx: E faecalis (amp-S)    Thank you for allowing pharmacy to be a part of this patient's care.  Adria Dill, PharmD PGY-2 Infectious Diseases Resident  06/05/2022 9:38 AM

## 2022-06-05 NOTE — Evaluation (Addendum)
Occupational Therapy Evaluation Patient Details Name: Elaine [name] MRN: [id number] DOB: [birth year] Today's Date: 06/05/2022   History of Present Illness 81 yo female who presented with dyspnea and edema. CT abdomen with inflammation at the urinary bladder, positive diverticulosis. IV antibiotics started. Pt with the past medical history of coronary artery disease, atrial fibrillation, COPD, chronic hypoxemic respiratory failure and systolic heart failure   Clinical Impression   Elaine [name] was evaluated s/p the above admission list, she is typically mod I at home with use of RW and bathroom DME. She lives alone in an entry level apartment and had a son near by who can assist as needed. Upon evaluation she was limited by generalized weakness, decreased activity tolerance, impaired cognition, and poor/unsafe use of RW with mobility. Overall she requried mod A for bed mobility, min A for simple tranfers with RW and cues for safety. Due to deficits listed below she currently needs up to max A for ADLs. Pt needs increased time for all tasks and cues for attention. OT to continue to follow acutely. Recommend SNF at d/c unless family can provide 24/7 direct physical assist.      Recommendations for follow up therapy are one component of a multi-disciplinary discharge planning process, led by the attending physician.  Recommendations may be updated based on patient status, additional functional criteria and insurance authorization.   Follow Up Recommendations  Skilled nursing-short term rehab (<3 hours/day) (pending progression acutely)    Assistance Recommended at Discharge Frequent or constant Supervision/Assistance  Patient can return home with the following A lot of help with walking and/or transfers;A lot of help with bathing/dressing/bathroom;Assistance with cooking/housework;Direct supervision/assist for medications management;Direct supervision/assist for financial management;Assist for  transportation;Help with stairs or ramp for entrance    Functional Status Assessment  Patient has had a recent decline in their functional status and demonstrates the ability to make significant improvements in function in a reasonable and predictable amount of time.  Equipment Recommendations  None recommended by OT    Recommendations for Other Services       Precautions / Restrictions Precautions Precautions: Fall Restrictions Weight Bearing Restrictions: No      Mobility Bed Mobility Overal bed mobility: Needs Assistance Bed Mobility: Supine to Sit     Supine to sit: Mod assist          Transfers Overall transfer level: Needs assistance Equipment used: Rolling walker (2 wheels) Transfers: Sit to/from Stand, Bed to chair/wheelchair/BSC Sit to Stand: Min assist     Step pivot transfers: Min guard     General transfer comment: pt hovers over RW with forearms on RW      Balance Overall balance assessment: Needs assistance Sitting-balance support: Feet supported Sitting balance-Leahy Scale: Fair     Standing balance support: Bilateral upper extremity supported, During functional activity Standing balance-Leahy Scale: Poor                             ADL either performed or assessed with clinical judgement   ADL Overall ADL's : Needs assistance/impaired Eating/Feeding: Set up;Sitting Eating/Feeding Details (indicate cue type and reason): due to low vision Grooming: Set up;Sitting   Upper Body Bathing: Minimal assistance;Sitting   Lower Body Bathing: Maximal assistance;Sit to/from stand   Upper Body Dressing : Set up;Sitting   Lower Body Dressing: Maximal assistance;Sit to/from stand   Toilet Transfer: Minimal assistance;Stand-pivot;Rolling walker (2 wheels) Toilet Transfer Details (indicate cue type and reason): pt hovers  with forarms on RW. "This is how I do it at home" Elaine [name] and Hygiene: Maximal assistance;Sit  to/from stand       Functional mobility during ADLs: Minimal assistance;Rolling walker (2 wheels) General ADL Comments: limited by general weakness, decreased activity tolerance. poor technique with RW use     Vision Baseline Vision/History: 2 Legally blind Patient Visual Report: No change from baseline Vision Assessment?: Vision impaired- to be further tested in functional context Additional Comments: low vision.     Perception     Praxis      Pertinent Vitals/Pain Pain Assessment Pain Assessment: Faces Faces Pain Scale: Hurts a little bit Pain Location: generalized with  movement Pain Descriptors / Indicators: Discomfort Pain Intervention(s): Limited activity within patient's tolerance, Monitored during session     Hand Dominance Right   Extremity/Trunk Assessment Upper Extremity Assessment Upper Extremity Assessment: Generalized weakness   Lower Extremity Assessment Lower Extremity Assessment: Defer to PT evaluation   Cervical / Trunk Assessment Cervical / Trunk Assessment: Other exceptions Cervical / Trunk Exceptions: increased body habitus   Communication Communication Communication: No difficulties   Cognition Arousal/Alertness: Awake/alert Behavior During Therapy: WFL for tasks assessed/performed Overall Cognitive Status: No family/caregiver present to determine baseline cognitive functioning                                 General Comments: Overall oriented and cog WFL for simple tasks assessed. Pt mildly verbose and self-distraction, able to be re-directed. would benefit from higher level IADL cog assessment     General Comments  VSS. some skin breakdown on back side noted. RN notified.    Exercises     Shoulder Instructions      Home Living Family/patient expects to be discharged to:: Private residence Living Arrangements: Alone Available Help at Discharge: Family;Available PRN/intermittently Type of Home: Apartment Home Access:  Level entry     Home Layout: One level     Bathroom Shower/Tub: Occupational psychologist: Handicapped height Bathroom Accessibility: Yes   Home Equipment: Rollator (4 wheels);Rolling Walker (2 wheels);BSC/3in1;Shower seat - built in;Grab bars - toilet;Grab bars - tub/shower;Hand held shower head;Wheelchair - manual          Prior Functioning/Environment Prior Level of Function : Independent/Modified Independent             Mobility Comments: rollator for all mobility ADLs Comments: reports mod I        OT Problem List: Decreased range of motion;Decreased strength;Decreased activity tolerance;Impaired balance (sitting and/or standing);Decreased safety awareness;Decreased knowledge of use of DME or AE;Decreased knowledge of precautions;Pain      OT Treatment/Interventions: Self-care/ADL training;Therapeutic exercise;DME and/or AE instruction;Therapeutic activities;Patient/family education;Balance training    OT Goals(Current goals can be found in the care plan section) Acute Rehab OT Goals Patient Stated Goal: home OT Goal Formulation: With patient Time For Goal Achievement: 06/19/22 Potential to Achieve Goals: Good ADL Goals Pt Will Perform Grooming: with supervision;standing Pt Will Perform Upper Body Bathing: Independently Pt Will Perform Lower Body Dressing: with min guard assist;sit to/from stand Pt Will Transfer to Toilet: with modified independence;ambulating Additional ADL Goal #1: Pt will indep complete IADL medication management task with increased time  OT Frequency: Min 2X/week    Co-evaluation              AM-PAC OT "6 Clicks" Daily Activity     Outcome Measure Help from another person eating  meals?: A Little Help from another person taking care of personal grooming?: A Little Help from another person toileting, which includes using toliet, bedpan, or urinal?: A Lot Help from another person bathing (including washing, rinsing, drying)?: A  Lot Help from another person to put on and taking off regular upper body clothing?: A Little Help from another person to put on and taking off regular lower body clothing?: A Lot 6 Click Score: 15   End of Session Equipment Utilized During Treatment: Rolling walker (2 wheels);Gait belt Nurse Communication: Mobility status  Activity Tolerance: Patient tolerated treatment well Patient left: in chair;with call bell/phone within reach  OT Visit Diagnosis: Unsteadiness on feet (R26.81);Other abnormalities of gait and mobility (R26.89);Muscle weakness (generalized) (M62.81)                Time: 9417-4081 OT Time Calculation (min): 23 min Charges:  OT General Charges $OT Visit: 1 Visit OT Evaluation $OT Eval Moderate Complexity: 1 Mod OT Treatments $Therapeutic Activity: 8-22 mins    Elliot Cousin 06/05/2022, 9:48 AM

## 2022-06-05 NOTE — Progress Notes (Signed)
Informed of MRI for today.   Device system confirmed to be MRI conditional, with implant date > 6 weeks ago, and no evidence of abandoned or epicardial leads in review of most recent CXR Interrogation from today reviewed, pt is currently VP at 73 bpm  Change device settings for MRI to VOO at 85 bpm  Tachy-therapies to off if applicable.  Program device back to pre-MRI settings after completion of exam.  Shirley Friar, PA-C  06/05/2022 1:33 PM

## 2022-06-05 NOTE — Progress Notes (Signed)
ID brief note   IMPRESSION: 1. Markedly limited exam due to the degree of motion artifact and lack of IV contrast. 2. Within this limitation, there are findings that are worrisome for discitis/osteomyelitis at T8-T9 with a small fluid collection along the left lateral aspect of the T8-T9 vertebral body level measuring approximately 1.9 x 1.3 cm. No definite evidence of epidural abscess, but assessment is markedly limited due to the degree of motion artifact. Recommend further evaluation with a contrast enhanced exam if the patient is able to limit movement at time of imaging. 3. Small bilateral pleural effusions   IR consulted for evaluation for possible aspiration/biopsy of disc/fluid collection.   TEE planned for tomorrow.   Rosiland Oz, MD Infectious Disease Physician [facility] [address] [place], [place] [address] Phone: [phone]  Fax: [phone]

## 2022-06-05 NOTE — Assessment & Plan Note (Signed)
CKD stage IV, Hyponatremia  Renal function with serum cr at 2,25 with K at 4,1 and serum bicarbonate at 24. Plan to continue diuresis with oral torsemide Follow up renal function in am.

## 2022-06-05 NOTE — H&P (View-Only) (Signed)
Advanced Heart Failure Team Consult Note   Primary Physician: Ginger Organ., MD PCP-Cardiologist:  Glenetta Hew, MD  Reason for Consultation: acute/chronic systolic heart failure  HPI:    Elaine [name] is seen today for evaluation of acute/chronic systolic heart failure at the request of Dr. Cathlean Sauer, internal medicine.   Elaine [name] is a 81 y.o. with history of CAD s/p CABG, COPD, CKD stage 3, and chronic systolic systolic CHF/ischemic cardiomyopathy.  Patient was admitted in 2/15 with atrial fibrillation/RVR and chest pain.  Cath showed severe 3VD and she had CABG x 3. Echo in 2/15 showed EF down to 20-25%. In 2018, it appears that she went back into atrial fibrillation and has remained in atrial fibrillation persistently.  DCCV was not attempted.  Most recent echo in 10/20 showed EF < 20% with normal RV.  She had a St Jude CRT-D device implanted in 11/20. She says that she feels "90%" better with CRT.  She is followed by Dr. Hollie Salk with nephrology.    She has been in atrial fibrillation persistently, previously attempted DCCV while on amiodarone but she did not convert.  Therefore, no amiodarone.     Echo in 3/21 showed EF 20% with diffuse hypokinesis, mildly dilated RV with mildly decreased systolic function, PASP 49 mmHg, dilated IVC.  In 9/21, she had AV nodal ablation.     She stopped empagliflozin due to UTIs and yeast infections.    Echo 12/22 EF 20-25%, mildly decreased RV function.    Presented to the ED w/ h/o recurrent UTI's and new worsening back pain. In ED BNP 1191, CXR: cardiomegaly with mild vascular congestion, edema, and small left pleural effusion., CTA/P notable for inflammatory changes involving the urinary bladder. Has noticed increased swelling in her legs the last couple of days. Intt SOB, uses inogen more frequently during the night but doesn't need it during the day. On 3L [place].   On assessment patient in chair. No SOB at rest. Denies CP.   Echo 10/23   EF 25 to 30%, global hypokinesis, mild dyskinesis of the left ventricular entire apical segment. Moderate reduction in RV systolic function, RVSP 61.4 mmHg. Moderate to severe TR    Home Medications Prior to Admission medications   Medication Sig Start Date End Date Taking? Authorizing Provider  acetaminophen (TYLENOL) 650 MG CR tablet Take 1,300 mg by mouth 2 (two) times daily.   Yes [provider]  albuterol (VENTOLIN HFA) 108 (90 Base) MCG/ACT inhaler Inhale 2 puffs into the lungs every 6 (six) hours as needed for wheezing or shortness of breath.   Yes [provider]  apixaban (ELIQUIS) 2.5 MG TABS tablet Take 2.5 mg by mouth 2 (two) times daily.   Yes [provider]  Coenzyme Q10 (COQ-10) 100 MG CAPS Take 100 mg by mouth in the morning and at bedtime.    Yes [provider]  colchicine 0.6 MG tablet Take 0.6 mg by mouth daily as needed (Gout).    Yes [provider]  Cranberry 250 MG TABS Take 500 mg by mouth 2 (two) times daily.   Yes [provider]  Dulaglutide (TRULICITY [place]) Inject 1.5 mg into the skin once a week. Tuesday   Yes [provider]  ELDERBERRY PO Take 1 tablet by mouth 2 (two) times daily.   Yes [provider]  ezetimibe (ZETIA) 10 MG tablet Take 10 mg by mouth daily.   Yes [provider]  ferrous  sulfate 325 (65 FE) MG tablet Take 325 mg by mouth every other day.   Yes [provider]  HYDROcodone-acetaminophen (NORCO/VICODIN) 5-325 MG tablet Take 1 tablet by mouth every 6 (six) hours as needed for moderate pain. 04/13/22 04/13/23 Yes Mcarthur Rossetti, MD  isosorbide-hydrALAZINE (BIDIL) 20-37.5 MG tablet Take 0.5 tablets by mouth 3 (three) times daily. 12/15/21  Yes Larey Dresser, MD  levothyroxine (SYNTHROID) 112 MCG tablet Take 1 tablet (112 mcg total) by mouth daily before breakfast. 02/05/14  Yes Donnajean Lopes, MD  metolazone (ZAROXOLYN) 2.5 MG tablet Take as directed  by CHF Clinic only Patient taking differently: Take 2.5 mg by mouth daily as needed (CHF excessive edema of over 5 Lbs.). Take as directed by CHF Clinic only 01/27/22  Yes Larey Dresser, MD  metoprolol succinate (TOPROL-XL) 100 MG 24 hr tablet Take 1 tablet (100 mg total) by mouth every morning AND 0.5 tablets (50 mg total) every evening. Take with or immediately following a meal.. 01/27/22  Yes Larey Dresser, MD  Multiple Vitamins-Minerals (MULTI FOR HER 50+ PO) Take 1 tablet by mouth daily.   Yes [provider]  Multiple Vitamins-Minerals (PRESERVISION AREDS 2) CAPS Take 1 capsule by mouth 2 (two) times daily.   Yes [provider]  nitrofurantoin, macrocrystal-monohydrate, (MACROBID) 100 MG capsule Take 100 mg by mouth daily. 04/06/22  Yes [provider]  potassium chloride SA (KLOR-CON M) 20 MEQ tablet Take 20 mEq by mouth every other day.   Yes [provider]  rosuvastatin (CRESTOR) 5 MG tablet Take 5 mg by mouth every other day.    Yes [provider]  torsemide (DEMADEX) 20 MG tablet TAKE 3 TABLETS BY MOUTH IN THE MORNING Patient taking differently: Take 60 mg by mouth daily. 02/16/22  Yes Larey Dresser, MD  umeclidinium-vilanterol [facility] ELLIPTA) 62.5-25 MCG/INH AEPB Inhale 1 puff into the lungs daily.   Yes [provider]    Past Medical History: Past Medical History:  Diagnosis Date   Acute myocardial infarction of anterior wall ([place]) 10/20/2013   Afib, LBBB   Acute pulmonary edema with congestive heart failure ([place]) 10/20/2013   Resolved   AICD (automatic cardioverter/defibrillator) present    Arthritis    Atrial fibrillation ([place]) 10/20/2013   On Warfarin   CAD, multiple vessel 10/20/2013   LM, LAD & RCA --> CABG; MYOVIEW 12/'15: Intermediate Risk would large anterior, anteroapical segment the apex possible infarct and peri-infarct ischemia   CHF (congestive heart failure) (HCC)    EF 40-45%.   Chronic kidney disease     COPD (chronic obstructive pulmonary disease) (HCC)    Diabetic neuropathy (HCC)    Heart murmur    Likely related to aortic sclerosis   Hyperlipidemia    Hypertension    Hypothyroidism    Ischemic cardiomyopathy - Notable Improvement in EF post CABG 10/20/2013   Echo 2/23: EF 20-25%; mild LVH. anteroseptal Akinesis; mid-apical anterior, inferior & inferoseptal + apical-lateral akinesis; G 1 DDysfxn.; Mild-Mod LA dil; mild MR; Mod PHTN;; F/u Echo 12/2013: EF 40-45%, septal & apical HK, mild LVH; Mod LA dilation   Left bundle branch block (LBBB) on electrocardiogram    Mild mitral regurgitation by prior echocardiogram    Mild-Mod MR on Echo   Morbid obesity ([place])    OSTEOARTHRITIS 08/06/2006   S/P CABG x 3 10/23/2013   LIMA to LAD, SVG to OM2, SVG to RPLB, EVH via right thigh   Type II diabetes  mellitus with complication ([place]) 96/78/9381   off rx since heart attack 12/15-dr told could stay off if keep cbg under 150   UTI (lower urinary tract infection) 09/29/2014   ongoing now. started rx 09/28/14    Past Surgical History: Past Surgical History:  Procedure Laterality Date   ABDOMINAL HYSTERECTOMY     AV NODE ABLATION N/A 05/19/2020   Procedure: AV NODE ABLATION;  Surgeon: Constance Haw, MD;  Location: [place] [facility];  Service: Cardiovascular;  Laterality: N/A;   BIV ICD INSERTION CRT-D N/A 07/28/2019   Procedure: BIV ICD INSERTION CRT-D;  Surgeon: Constance Haw, MD;  Location: [place] [facility];  Service: Cardiovascular;  Laterality: N/A;   BREAST BIOPSY Right 08/04/2016    fibroadenoma with calcifications    BREAST BIOPSY Right 07/16/2014   fibroadenoma with calcifications and atypical   BREAST EXCISIONAL BIOPSY Right 10/06/2014   atypical lobular hyperplasia    BREAST LUMPECTOMY W/ NEEDLE LOCALIZATION Right 10/06/2014   Dr Georgette Dover   BREAST LUMPECTOMY WITH NEEDLE LOCALIZATION Right 10/06/2014   Procedure: RIGHT BREAST LUMPECTOMY WITH NEEDLE LOCALIZATION;  Surgeon:  Donnie Mesa, MD;  Location: [facility];  Service: General;  Laterality: Right;   CARDIOVERSION N/A 10/22/2019   Procedure: CARDIOVERSION;  Surgeon: Larey Dresser, MD;  Location: [facility];  Service: Cardiovascular;  Laterality: N/A;   CARPAL TUNNEL RELEASE Right 04/13/2022   Procedure: RIGHT OPEN CARPAL TUNNEL RELEASE;  Surgeon: Mcarthur Rossetti, MD;  Location: [facility];  Service: Orthopedics;  Laterality: Right;   CLIPPING OF ATRIAL APPENDAGE N/A 10/23/2013   Procedure: CLIPPING OF ATRIAL APPENDAGE;  Surgeon: Rexene Alberts, MD;  Location: [facility];  Service: Open Heart Surgery;  Laterality: N/A;   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   CORONARY ARTERY BYPASS GRAFT N/A 10/23/2013   Procedure: CORONARY ARTERY BYPASS GRAFTING (CABG);  Surgeon: Rexene Alberts, MD;  Location: [place];  Service: Open Heart Surgery: LIMA-LAD, SVG-OM2, SVG-RPL   INTRAOPERATIVE TRANSESOPHAGEAL ECHOCARDIOGRAM N/A 10/23/2013   Procedure: INTRAOPERATIVE TRANSESOPHAGEAL ECHOCARDIOGRAM;  Surgeon: Rexene Alberts, MD;  Location: [place];  Service: Open Heart Surgery;  Laterality: N/A;   LEFT HEART CATHETERIZATION WITH CORONARY ANGIOGRAM N/A 10/20/2013   Procedure: LEFT HEART CATHETERIZATION WITH CORONARY ANGIOGRAM;  Surgeon: Leonie Man, MD;  Location: [facility] CATH LAB;  Service: Cardiovascular: Distal LM ~70%, LAD - ostial 70%, prox 80, 95 & 95%; RCA prox 70%, mid 80%; minimal Cx disease   LEFT HEART CATHETERIZATION WITH CORONARY/GRAFT ANGIOGRAM N/A 09/08/2014   Procedure: LEFT HEART CATHETERIZATION WITH Beatrix Fetters;  Surgeon: Leonie Man, MD;  Location: [facility] CATH LAB: Indication: Abnormal Myoview. Occluded LAD after 70 and 80% left main. Diffuse RCA disease. Widely patent grafts to Moderately diseased artery vessels. Mild-moderate LV dysfunction and chronic A. fib.   NM MYOVIEW LTD  08/14/2014   [place] scan: INTERMEDIATE RISK - large, severe intensity perfusion defect in anterior wall, apex and inferior apex that is  partly reversible. This suggests either scar or possible hibernating myocardium. Nondeviated.-> Likely scar. No change in coronary anatomy with patent grafts.   TOTAL HIP ARTHROPLASTY  2010, 2011   right 2010, left 2011   TRANSTHORACIC ECHOCARDIOGRAM  12/2013   Mildly dilated left ventricle with mild to moderately reduced function. EF 40-45% (notable improvement from February 2015). Septal and apical hypokinesis. Mild LA dilation.   TRANSTHORACIC ECHOCARDIOGRAM  11/11/2019   EF 20% with severely dysfunction. Global HK. Unable to determine diastolic pressures. Mild RV  function-with mildly elevated PAP-RAP estimated 15 mmHg.. Moderate LA dilation, mild RA dilation.    Family History: Family History  Adopted: Yes  Problem Relation Age of Onset   Other Mother    Hypertension Mother    Colon cancer Neg Hx    Esophageal cancer Neg Hx    Rectal cancer Neg Hx    Stomach cancer Neg Hx     Social History: Social History   Socioeconomic History   Marital status: Widowed    Spouse name: Not on file   Number of children: Not on file   Years of education: Not on file   Highest education level: Not on file  Occupational History   Occupation: retired  Tobacco Use   Smoking status: Former    Packs/day: 0.50    Years: 39.00    Total pack years: 19.50    Types: Cigarettes    Quit date: 08/28/1993    Years since quitting: 28.7   Smokeless tobacco: Never  Vaping Use   Vaping Use: Never used  Substance and Sexual Activity   Alcohol use: No    Comment: quit 95   Drug use: No   Sexual activity: Not on file  Other Topics Concern   Not on file  Social History Narrative   Lives with husband and son in [place].  She is currently retired.   Former smoker quit in 1995.   Social Determinants of Health   Financial Resource Strain: Medium Risk (11/25/2019)   Overall Financial Resource Strain (CARDIA)    Difficulty of Paying Living Expenses: Somewhat hard  Food Insecurity: No Food Insecurity  (11/25/2019)   Hunger Vital Sign    Worried About Running Out of Food in the Last Year: Never true    Ran Out of Food in the Last Year: Never true  Transportation Needs: No Transportation Needs (11/25/2019)   PRAPARE - Hydrologist (Medical): No    Lack of Transportation (Non-Medical): No  Physical Activity: Not on file  Stress: Not on file  Social Connections: Not on file    Allergies:  Allergies  Allergen Reactions   Crestor [Rosuvastatin Calcium] Other (See Comments)    Cramps- still taking    Allopurinol Hives   Cephalosporins Rash   Tape Rash    Adhesive tape    Objective:    Vital Signs:   Temp:  [98 F (36.7 C)-98.9 F (37.2 C)] 98.1 F (36.7 C) (10/09 0725) Pulse Rate:  [72-74] 73 (10/09 0833) Resp:  [15-19] 16 (10/09 0524) BP: (99-125)/(49-61) 118/57 (10/09 0833) SpO2:  [92 %-100 %] 100 % (10/09 0823) Weight:  [103.5 kg] 103.5 kg (10/09 0524) Last BM Date : 06/01/22  Weight change: Filed Weights   06/02/22 1517 06/04/22 0600 06/05/22 0524  Weight: 99.8 kg 100 kg 103.5 kg    Intake/Output:   Intake/Output Summary (Last 24 hours) at 06/05/2022 1047 Last data filed at 06/04/2022 2209 Gross per 24 hour  Intake 160 ml  Output 500 ml  Net -340 ml      Physical Exam    General:  elderly appearing.  No respiratory difficulty HEENT: normal Neck: supple. JVD ~7 cm. Carotids 2+ bilat; no bruits. No lymphadenopathy or thyromegaly appreciated. Cor: PMI nondisplaced. Regular rate & rhythm. No rubs, gallops or murmurs. Lungs: clear, diminished bases Abdomen: soft, nontender, nondistended. No hepatosplenomegaly. No bruits or masses. Good bowel sounds. Extremities: no cyanosis, clubbing, rash, +1 BLE edema  Neuro: alert & oriented x  3, cranial nerves grossly intact. moves all 4 extremities w/o difficulty. Affect pleasant.   Telemetry   V-paced 70s, 0-1PVCs/min (Personally reviewed)    EKG    V paced 73bpm  Labs   Basic  Metabolic Panel: Recent Labs  Lab 06/02/22 1538 06/03/22 0243 06/03/22 0740 06/04/22 0705 06/05/22 0242  NA 138 137 138 136 134*  K 5.2* 4.2 4.4 4.0 4.1  CL 100  --  99 96* 99  CO2 28  --  '26 27 24  '$ GLUCOSE 262*  --  182* 174* 156*  BUN 46*  --  51* 50* 62*  CREATININE 1.87*  --  1.87* 1.91* 2.25*  CALCIUM 9.7  --  9.6 9.6 8.5*  MG  --   --   --  2.0  --     Liver Function Tests: Recent Labs  Lab 06/02/22 1538 06/03/22 0740 06/04/22 0705 06/05/22 0242  AST 36 31 38 28  ALT '28 27 27 26  '$ ALKPHOS 104 101 109 97  BILITOT 2.6* 2.0* 1.5* 1.1  PROT 7.4 6.9 6.4* 6.1*  ALBUMIN 2.6* 2.4* 2.2* 1.9*   Recent Labs  Lab 06/02/22 1538  LIPASE 69*   No results for input(s): "AMMONIA" in the last 168 hours.  CBC: Recent Labs  Lab 06/02/22 1538 06/03/22 0243 06/03/22 0740 06/04/22 0705 06/05/22 0242  WBC 14.0*  --  12.7* 10.4 9.3  HGB 9.9* 10.2* 9.3* 9.2* 8.4*  HCT 29.1* 30.0* 28.6* 27.2* 25.1*  MCV 87.9  --  89.1 85.5 86.0  PLT 293  --  243 216 206    Cardiac Enzymes: No results for input(s): "CKTOTAL", "CKMB", "CKMBINDEX", "TROPONINI" in the last 168 hours.  BNP: BNP (last 3 results) Recent Labs    10/26/21 1056 11/29/21 1057 06/03/22 0228  BNP 479.3* 396.3* 1,191.1*    ProBNP (last 3 results) No results for input(s): "PROBNP" in the last 8760 hours.   CBG: Recent Labs  Lab 06/04/22 0813 06/04/22 1243 06/04/22 1636 06/04/22 2214 06/05/22 0724  GLUCAP 171* 201* 229* 147* 144*    Coagulation Studies: No results for input(s): "LABPROT", "INR" in the last 72 hours.   Imaging   No results found.   Medications:     Current Medications:  apixaban  2.5 mg Oral BID   insulin aspart  0-5 Units Subcutaneous QHS   insulin aspart  0-6 Units Subcutaneous TID WC   isosorbide-hydrALAZINE  0.5 tablet Oral TID   levothyroxine  112 mcg Oral Q0600   metoprolol succinate  50 mg Oral BID   rosuvastatin  5 mg Oral QODAY   sodium chloride flush  3 mL  Intravenous Q12H   torsemide  40 mg Oral Daily   umeclidinium-vilanterol  1 puff Inhalation Daily    Infusions:  ampicillin (OMNIPEN) IV        Patient Profile   Elaine [name] is a 81 y.o. female with history of CAD s/p CABG, COPD, CKD stage 3, and chronic systolic systolic CHF/ischemic cardiomyopathy. AHF team asked to see for acute/chronic systolic systolic CHF/ICM.  Assessment/Plan   1. Acute on chronic systolic CHF: Ischemic cardiomyopathy. - St Jude CRT-D device, now s/p AV nodal ablation.  Echo in 3/21 showed EF 20% with diffuse hypokinesis, mildly dilated RV with mildly decreased systolic function, PASP 49 mmHg, dilated IVC. Echo 12/22 EF 20-25%, mildly decreased RV function.   - Echo 10/23  EF 25 to 30%, global hypokinesis, mild dyskinesis of the left ventricular entire apical segment. Moderate  reduction in RV systolic function, RVSP 60.1 mmHg. Moderate to severe TR  - Presented NYHA III-IIIb. Limited d/t peripheral neuropathy - Volume status stable now, given IV lasix in ED and during admission, now transitioned to PO. Has dependent edema. Will attempt to get [facility] reading - Increase torsemide to home dose '60mg'$  daily - Unable to tolerate Bidil 1 tab tid, continue Bidil 1/2 tab tid  - On Toprol XL 100 qam/50 qpm at home, '50mg'$  BID here - Off empagliflozin due to yeast infection and UTI.  - Off spironolactone with side effects.  - TEE scheduled for tomorrow per ID recs w/ E faecalis bacteremia high burden - Place UNNA boots - Strict I&O, daily weights  2. CAD: S/p CABG in 2015. No chest pain.   - No ASA given apixaban use.  - Continue Crestor 5 daily + Zetia, good lipids 12/22  3. Atrial fibrillation: Chronic now, looks like it has gone on since 2018.  Unable to cardiovert her on amiodarone so it was stopped.  At this point Afib chronic. She is now s/p AVN ablation to allow more BiV pacing. Rate controlled. V-paced. - Continue apixaban.   4. CKD: Stage 4: SCr baseline  2.3-2.5.  - Cr 2.25 today, stable - avoid hypotension  5. UTI/back pain/bacteremia - per primary on abx - ID following - E faecalis bacteremia high burden, TEE scheduled for tomorrow - MRI back/spine pending  6. DM  - A1c 6.4 - SSI per primary  7. Chronic respiratory failure with hypoxia - No signs of exacerbation   Length of Stay: [place], AGACNP-BC  06/05/2022, 10:47 AM  Advanced Heart Failure Team Pager [phone] (M-F; 7a - 5p)  Please contact [facility] for night-coverage after hours (4p -7a ) and weekends on [email or web address]

## 2022-06-05 NOTE — Progress Notes (Signed)
Patient to MRI from 6E09 for thoracic spine wo contrast. Patient has St Jude device. Patient was already seen at the bedside by st jude reps. Orders for VOO 85. Will re-program once scan is completed.

## 2022-06-05 NOTE — Progress Notes (Signed)
Call from [place] stating that pt HR dropped to 30's twice within about a 10 minute timeframe but brought herself back up. Pt currently sitting in the 70s. MD aware.

## 2022-06-05 NOTE — Evaluation (Signed)
Physical Therapy Evaluation Patient Details Name: Elaine [name] MRN: [id number] DOB: 01-04-1941 Today's Date: 06/05/2022  History of Present Illness  Pt is an 81 y/o F admitted on 06/02/22 with a working diagnosis of decompensated heart failure. Pt is also being treated for a UTI, positive diverticulosis. PMH: CAD, a-fib, COPD, chronic hypoxemic respiratory failure & systolic heart failure  Clinical Impression  Pt seen for PT evaluation with pt reporting prior to admission she was mod I with rollator, living alone in a level entry apartment with her son nearby. On this date pt requires 2 attempts & ultimately max assist for successful STS with RW. Pt is able to take a few steps forwards & backwards with RW & min assist but prefers to maintain BUE on front bar of RW & have flexed posture, noting this is more comfortable. Pt is limited by back pain overall. Pt does engage in BLE strengthening exercises from chair level. At this time, pt is unsafe to d/c home alone & would benefit from STR upon d/c to maximize independence with functional mobility & reduce fall risk prior to return home. Will continue to follow pt acutely to address strengthening, activity tolerance, and gait with LRAD.      Recommendations for follow up therapy are one component of a multi-disciplinary discharge planning process, led by the attending physician.  Recommendations may be updated based on patient status, additional functional criteria and insurance authorization.  Follow Up Recommendations Skilled nursing-short term rehab (<3 hours/day) Can patient physically be transported by private vehicle: No    Assistance Recommended at Discharge Frequent or constant Supervision/Assistance  Patient can return home with the following  A lot of help with walking and/or transfers;A lot of help with bathing/dressing/bathroom;Assist for transportation;Help with stairs or ramp for entrance;Direct supervision/assist for medications  management;Two people to help with bathing/dressing/bathroom;Assistance with cooking/housework    Equipment Recommendations None recommended by PT  Recommendations for Other Services       Functional Status Assessment Patient has had a recent decline in their functional status and demonstrates the ability to make significant improvements in function in a reasonable and predictable amount of time.     Precautions / Restrictions Precautions Precautions: Fall Restrictions Weight Bearing Restrictions: No      Mobility  Bed Mobility               General bed mobility comments: not observed pt received & left in recliner    Transfers Overall transfer level: Needs assistance Equipment used: Rolling walker (2 wheels) Transfers: Sit to/from Stand Sit to Stand: Max assist           General transfer comment: STS from recliner with cuing for hand placement on armrests, 2 attempts for succesful STS.    Ambulation/Gait Ambulation/Gait assistance: Min assist, Min guard Gait Distance (Feet): 4 Feet Assistive device: Rolling walker (2 wheels) Gait Pattern/deviations: Decreased step length - right, Decreased step length - left, Decreased dorsiflexion - right, Decreased dorsiflexion - left, Decreased stride length Gait velocity: decreased     General Gait Details: decreased dorsiflexion BLE, decreased BLE heel strike, pt initially ambulates with BUE on proper position on RW but then reports she's unable to tolerate upright standing so places BUE on front bar of RW & ambulates with flexed posture, decreased step length BLE, decreased stride length, extra time between each step  Stairs            Wheelchair Mobility    Modified Rankin (Stroke Patients Only)  Balance Overall balance assessment: Needs assistance Sitting-balance support: Feet supported Sitting balance-Leahy Scale: Fair     Standing balance support: Bilateral upper extremity supported, During  functional activity, Reliant on assistive device for balance Standing balance-Leahy Scale: Poor                               Pertinent Vitals/Pain Pain Assessment Pain Assessment: Faces Faces Pain Scale: Hurts even more Pain Location: low back Pain Descriptors / Indicators: Discomfort Pain Intervention(s): Monitored during session, Repositioned    Home Living Family/patient expects to be discharged to:: Private residence Living Arrangements: Alone Available Help at Discharge: Family;Available PRN/intermittently Type of Home: Apartment Home Access: Level entry       Home Layout: One level Home Equipment: Rollator (4 wheels);Rolling Walker (2 wheels);BSC/3in1;Shower seat - built in;Grab bars - toilet;Grab bars - tub/shower;Hand held shower head;Wheelchair - manual      Prior Function Prior Level of Function : Independent/Modified Independent             Mobility Comments: Pt reports she's mod I with rollator. Still drives. ADLs Comments: reports mod I     Hand Dominance   Dominant Hand: Right    Extremity/Trunk Assessment   Upper Extremity Assessment Upper Extremity Assessment: Generalized weakness    Lower Extremity Assessment Lower Extremity Assessment: Generalized weakness (3-/5 knee extension BLE)    Cervical / Trunk Assessment Cervical / Trunk Assessment: Other exceptions Cervical / Trunk Exceptions: increased body habitus  Communication   Communication: No difficulties  Cognition Arousal/Alertness: Awake/alert Behavior During Therapy: WFL for tasks assessed/performed Overall Cognitive Status: No family/caregiver present to determine baseline cognitive functioning                                 General Comments: Follows all commands throughout session, mildy verbose during session but able to be redirected. Eager to participate & get better.        General Comments General comments (skin integrity, edema, etc.): Pt on  3L/min via nasal cannula, lowest SpO2 88% upon standing but recovered without cuing for pursed lip breathing. Some redness noted to low back.    Exercises General Exercises - Lower Extremity Long Arc Quad: AROM, Strengthening, Both, 10 reps, Seated Hip ABduction/ADduction: AROM, Strengthening, Both, Seated (hip adduction pillow squeezes) Hip Flexion/Marching: AROM, Strengthening, Both, 10 reps, Seated   Assessment/Plan    PT Assessment Patient needs continued PT services  PT Problem List Decreased strength;Decreased coordination;Pain;Cardiopulmonary status limiting activity;Decreased knowledge of use of DME;Decreased activity tolerance;Decreased mobility;Decreased balance;Decreased skin integrity       PT Treatment Interventions DME instruction;Therapeutic exercise;Gait training;Balance training;Neuromuscular re-education;Functional mobility training;Therapeutic activities;Patient/family education;Modalities;Manual techniques    PT Goals (Current goals can be found in the Care Plan section)  Acute Rehab PT Goals Patient Stated Goal: get better PT Goal Formulation: With patient Time For Goal Achievement: 06/19/22 Potential to Achieve Goals: Good    Frequency Min 3X/week     Co-evaluation               AM-PAC PT "6 Clicks" Mobility  Outcome Measure Help needed turning from your back to your side while in a flat bed without using bedrails?: A Lot Help needed moving from lying on your back to sitting on the side of a flat bed without using bedrails?: A Lot Help needed moving to and from a bed to  a chair (including a wheelchair)?: A Lot Help needed standing up from a chair using your arms (e.g., wheelchair or bedside chair)?: Total Help needed to walk in hospital room?: A Little Help needed climbing 3-5 steps with a railing? : Total 6 Click Score: 11    End of Session Equipment Utilized During Treatment: Gait belt;Oxygen Activity Tolerance: Patient limited by  fatigue;Patient limited by pain Patient left: in chair;with call bell/phone within reach Nurse Communication: Mobility status PT Visit Diagnosis: Muscle weakness (generalized) (M62.81);Unsteadiness on feet (R26.81);Pain Pain - Right/Left:  (bilateral) Pain - part of body:  (low back)    Time: 2341-4436 PT Time Calculation (min) (ACUTE ONLY): 18 min   Charges:   PT Evaluation $PT Eval Moderate Complexity: Atkinson, PT, DPT 06/05/22, 10:56 AM   Waunita Schooner 06/05/2022, 10:54 AM

## 2022-06-05 NOTE — NC FL2 (Signed)
[place] LEVEL OF CARE SCREENING TOOL     IDENTIFICATION  Patient Name: Elaine [name] Birthdate: [birth year] Sex: female Admission Date (Current Location): 06/02/2022  [facility] and [place] Number:  Herbalist and Address:  [facility] [place] [address], [place], [place] [address]      Provider Number: [id number]  Attending Physician Name and Address:  Tawni Millers  Relative Name and Phone Number:       Current Level of Care: Hospital Recommended Level of Care: [place] Prior Approval Number:    Date Approved/Denied:   PASRR Number: [id number]  Discharge Plan: SNF    Current Diagnoses: Patient Active Problem List   Diagnosis Date Noted   Bacteremia    Acute on chronic systolic CHF (congestive heart failure) ([place]) 06/03/2022   Cystitis 06/03/2022   Chronic respiratory failure with hypoxia ([place]) 06/03/2022   UTI (urinary tract infection) 06/03/2022   Class 3 obesity ([place]) 06/03/2022   Carpal tunnel syndrome, right upper limb 10/18/2020   Carpal tunnel syndrome, left upper limb 77/82/4235   Chronic systolic (congestive) heart failure ([place]) 07/29/2019   Hypothyroidism 06/16/2019   Acute on chronic combined systolic and diastolic CHF (congestive heart failure) ([place]) 06/11/2019   Bilateral leg pain 10/22/2017   Chronic combined systolic and diastolic CHF, NYHA class 2 ([place]) 10/22/2017   Edema of both legs 05/10/2015   Mass of right breast on mammogram 10/06/2014   DOE (dyspnea on exertion) 08/19/2014   Severe obesity (BMI >= 40) ([place]) 04/19/2014   CAD status post CABG 36/14/4315   Metabolic acidosis 40/03/6760   Acute on chronic renal insufficiency 02/02/2014   Near syncope 02/02/2014   Hypotension 02/02/2014   Rash 02/02/2014   CKD (chronic kidney disease), stage IV (HCC) 02/02/2014   Pruritus 02/02/2014   SOB (shortness of breath) 01/07/2014   Coumadin Rx post CABG 11/17/2013   Physical  deconditioning 11/05/2013   S/P CABG x 3- 10/23/13 10/23/2013   Permanent atrial fibrillation (HCC) - CHA2DS2-VASc Score 6, on Eliquis 10/20/2013   Left bundle branch block (LBBB) on electrocardiogram 10/20/2013   Left main coronary artery disease 10/20/2013   History of acute anterior wall myocardial infarction 10/20/2013   Ischemic cardiomyopathy 10/20/2013   Morbid obesity- BMI 48    Diabetic neuropathy (HCC)    Acute kidney injury superimposed on chronic kidney disease ([place])    Post corneal transplant 08/05/2012   Obesity 12/20/2006   Type 2 diabetes mellitus with hyperlipidemia ([place]) 08/06/2006   Hyperlipidemia with target LDL less than 70 08/06/2006   Essential hypertension 08/06/2006   OSTEOARTHRITIS 08/06/2006    Orientation RESPIRATION BLADDER Height & Weight     Self, Time, Situation, Place  O2 (3L [place]) Continent Weight: 228 lb 2.8 oz (103.5 kg) Height:  '5\' 2"'$  (157.5 cm)  BEHAVIORAL SYMPTOMS/MOOD NEUROLOGICAL BOWEL NUTRITION STATUS      Continent Diet (see d/c summary)  AMBULATORY STATUS COMMUNICATION OF NEEDS Skin   Extensive Assist Verbally Normal                       Personal Care Assistance Level of Assistance  Bathing, Feeding, Dressing Bathing Assistance: Maximum assistance Feeding assistance: Independent Dressing Assistance: Maximum assistance     Functional Limitations Info  Sight, Hearing, Speech Sight Info: Impaired (Blind in one eye) Hearing Info: Adequate Speech Info: Adequate    SPECIAL CARE FACTORS FREQUENCY  PT (By licensed PT), OT (By licensed  OT)     PT Frequency: 5x/week OT Frequency: 5x/week            Contractures Contractures Info: Not present    Additional Factors Info  Code Status, Allergies Code Status Info: full code Allergies Info: Crestor (rosuvastatin Calcium), Allopurinol, Cephalosporins, tape           Current Medications (06/05/2022):  This is the current hospital active medication list Current  Facility-Administered Medications  Medication Dose Route Frequency Provider Last Rate Last Admin   acetaminophen (TYLENOL) tablet 650 mg  650 mg Oral Q6H PRN Opyd, Ilene Qua, MD       Or   acetaminophen (TYLENOL) suppository 650 mg  650 mg Rectal Q6H PRN Opyd, Ilene Qua, MD       ampicillin (OMNIPEN) 2 g in sodium chloride 0.9 % 100 mL IVPB  2 g Intravenous Q8H Paytes, Austin A, RPH       apixaban (ELIQUIS) tablet 2.5 mg  2.5 mg Oral BID Opyd, Ilene Qua, MD   2.5 mg at 06/05/22 0833   insulin aspart (novoLOG) injection 0-5 Units  0-5 Units Subcutaneous QHS Opyd, Ilene Qua, MD   2 Units at 06/03/22 2255   insulin aspart (novoLOG) injection 0-6 Units  0-6 Units Subcutaneous TID WC Opyd, Ilene Qua, MD   2 Units at 06/04/22 1700   isosorbide-hydrALAZINE (BIDIL) 20-37.5 MG per tablet 0.5 tablet  0.5 tablet Oral TID Vianne Bulls, MD   0.5 tablet at 06/05/22 9211   levothyroxine (SYNTHROID) tablet 112 mcg  112 mcg Oral Q0600 Vianne Bulls, MD   112 mcg at 06/05/22 0515   metoprolol succinate (TOPROL-XL) 24 hr tablet 50 mg  50 mg Oral BID Opyd, Ilene Qua, MD   50 mg at 06/05/22 0833   ondansetron (ZOFRAN) tablet 4 mg  4 mg Oral Q6H PRN Opyd, Ilene Qua, MD       Or   ondansetron (ZOFRAN) injection 4 mg  4 mg Intravenous Q6H PRN Opyd, Ilene Qua, MD       rosuvastatin (CRESTOR) tablet 5 mg  5 mg Oral QODAY Opyd, Ilene Qua, MD   5 mg at 06/05/22 9417   senna-docusate (Senokot-S) tablet 1 tablet  1 tablet Oral QHS PRN Opyd, Ilene Qua, MD       sodium chloride flush (NS) 0.9 % injection 3 mL  3 mL Intravenous Q12H Opyd, Ilene Qua, MD   3 mL at 06/04/22 2200   [START ON 06/06/2022] torsemide (DEMADEX) tablet 60 mg  60 mg Oral Daily Diaz, Alma L, NP       umeclidinium-vilanterol (ANORO ELLIPTA) 62.5-25 MCG/ACT 1 puff  1 puff Inhalation Daily Opyd, Ilene Qua, MD   1 puff at 06/05/22 4081     Discharge Medications: Please see discharge summary for a list of discharge medications.  Relevant Imaging  Results:  Relevant Lab Results:   Additional Information SSN [place] Diona Peregoy, [place]

## 2022-06-05 NOTE — Progress Notes (Signed)
Progress Note   Patient: Elaine [name] DDU:[id number] DOB: April 28, 1941 DOA: 06/02/2022     2 DOS: the patient was seen and examined on 06/05/2022   Brief hospital course: Mr. Kham was admitted to the hospital with the working diagnosis of decompensated heart failure.   81 yo female with the past medical history of coronary artery disease, atrial fibrillation, COPD, chronic hypoxemic respiratory failure and systolic heart failure who presented with dyspnea and edema. Reported worsening dyspnea and lower extremity edema at home, positive dysuria and back pain. On his initial physical examination his blood pressure was 150/68, HR 73, RR 20 and 02 saturation 99% on 3 L/min per [place], lungs with rales bilaterally, with no wheezing, heart with S1 and S2 present and rhythmic, abdomen with no distention and positive lower extremity edema.   Na 138, K 5,2 CL 100 bicarbonate 28 glucose 262 bun 46 cr 1,87  BNP 1,191  Wbc 14,0 hgb 9,9 plt 293   Chest radiograph with cardiomegaly, no infiltrates, small left pleural effusion, pacemaker with one ventricular lead and one defibrillator lead in place.   CT abdomen with inflammation at the urinary bladder, positive diverticulosis with no diverticulitis.   EKG 73 bpm, right axis deviation, qtc not prolonged, ventricular paced rhythm with underlying atrial fibrillation, no significant ST segment or T wave changes.   Patient was placed on IV furosemide and IV antibiotic therapy .   Blood culture positive for enterococcus, antibiotic therapy was changed to IV ampicillin ID consultation has recommended TEE and spine MRI.   Assessment and Plan: * Acute on chronic systolic CHF (congestive heart failure) (HCC) Echocardiogram with reduced LV systolic function with EF 25 to 30%, global hypokinesis, mild dyskinesis of the left ventricular entire apical segment. Moderate reduction in RV systolic function, RVSP 23,7 mmHg. Moderate to severe TR   Systolic blood pressure 628  to 126 mmHg.   Continue with bidil and metoprolol.  Diuretic therapy with oral torsemide.  No SGLT 2 inh due to frequent urinary tract infections.   UTI (urinary tract infection) Enterococcus fecalis bacteremia (4/4 bottles).  Sensitive to ampicillin.  Wbc is 19,3   Continue antibiotic therapy with IV ampicillin. Plan to follow up on repeat blood cultures Case discussed with Dr Gale Journey, plan to for TEE and spine MRI.  Note patient has a pacemaker defibrillator in place.   Permanent atrial fibrillation (HCC) - CHA2DS2-VASc Score 6, on Eliquis Continue reate control with metoprolol Anticoagulation with apixaban.   Acute kidney injury superimposed on chronic kidney disease ([place]) CKD stage IV, Hyponatremia  Renal function with serum cr at 2,25 with K at 4,1 and serum bicarbonate at 24. Plan to continue diuresis with oral torsemide Follow up renal function in am.   CAD status post CABG Patient with no chest pain.   Essential hypertension Continue blood pressure control with metoprolol and bidil Diuresis with torsemide.    Type 2 diabetes mellitus with hyperlipidemia (HCC) Continue insulin sliding scale for glucose cover and monitoring,  Continue with rosuvastatin.   Chronic respiratory failure with hypoxia (HCC) COPD with no signs of exacerbation Continue bronchodilator therapy, oxymetry monitoring and supplemental 02 per [place] to keep 02 saturation 88% or greater.   Hypothyroidism Continue with levothyroxine   Class 3 obesity (HCC) Calculated BMI 40,2         Subjective: Patient with improvement in dyspnea and edema, no chest pain  Physical Exam: Vitals:   06/05/22 0725 06/05/22 0823 06/05/22 0833 06/05/22 1342  BP: [name])  120/53  (!) 118/57 126/64  Pulse: 73  73 73  Resp:    15  Temp: 98.1 F (36.7 C)   (!) 97.5 F (36.4 C)  TempSrc: Oral   Oral  SpO2: 100% 100%  95%  Weight:      Height:        Neurology awake and alert ENT with mild pallor Cardiovascular  with S1 and S2 present and rhythmic with no gallops Respiratory with no rales or wheezing Abdomen with no distention  Trace non pitting lower extremity edema  Data Reviewed:    Family Communication: no family at the bedside   Disposition: Status is: Inpatient Remains inpatient appropriate because: heart failure   Planned Discharge Destination: Skilled nursing facility      Author: Tawni Millers, MD 06/05/2022 2:36 PM  For on call review [email or web address].

## 2022-06-05 NOTE — TOC Initial Note (Signed)
Transition of Care [facility]) - Initial/Assessment Note    Patient Details  Name: Elaine [name] MRN: [id number] Date of Birth: Jan 11, 1941  [facility]) CM/SW Contact:    Bethann Berkshire, Vivian Phone Number: 06/05/2022, 2:36 PM  Clinical Narrative:                  CSW met with pt to discuss SNF recommendation. Pt lives at home in [place] along with her son. She uses o2 at home but doesn't remember what agency it's through. She states that her son helps her though that she believes she needs rehab before return home at her current level of mobility. Pt is agreeable to SNF workup. CSW explains workup and insurance auth process. Fl2 completed and bed requests faxed in hub.   Expected Discharge Plan: Skilled Nursing Facility Barriers to Discharge: Continued Medical Work up   Patient Goals and CMS Choice        Expected Discharge Plan and Services Expected Discharge Plan: [place] Choice: [place] arrangements for the past 2 months: Single Family Home                                      Prior Living Arrangements/Services Living arrangements for the past 2 months: Single Family Home Lives with:: Adult Children Patient language and need for interpreter reviewed:: Yes        Need for Family Participation in Patient Care: No (Comment) Care giver support system in place?: Yes (comment)   Criminal Activity/Legal Involvement Pertinent to Current Situation/Hospitalization: No - Comment as needed  Activities of Daily Living      Permission Sought/Granted                  Emotional Assessment Appearance:: Appears stated age Attitude/Demeanor/Rapport: Engaged Affect (typically observed): Accepting Orientation: : Oriented to Self, Oriented to Place, Oriented to  Time, Oriented to Situation Alcohol / Substance Use: Not Applicable Psych Involvement: No (comment)  Admission diagnosis:  Acute on  chronic systolic congestive heart failure (HCC) [I50.23] Acute cystitis with hematuria [N30.01] Generalized weakness [R53.1] Acute on chronic systolic CHF (congestive heart failure) (HCC) [I50.23] Patient Active Problem List   Diagnosis Date Noted   Bacteremia    Acute on chronic systolic CHF (congestive heart failure) ([place]) 06/03/2022   Cystitis 06/03/2022   Chronic respiratory failure with hypoxia ([place]) 06/03/2022   UTI (urinary tract infection) 06/03/2022   Class 3 obesity ([place]) 06/03/2022   Carpal tunnel syndrome, right upper limb 10/18/2020   Carpal tunnel syndrome, left upper limb 58/85/0277   Chronic systolic (congestive) heart failure ([place]) 07/29/2019   Hypothyroidism 06/16/2019   Acute on chronic combined systolic and diastolic CHF (congestive heart failure) ([place]) 06/11/2019   Bilateral leg pain 10/22/2017   Chronic combined systolic and diastolic CHF, NYHA class 2 ([place]) 10/22/2017   Edema of both legs 05/10/2015   Mass of right breast on mammogram 10/06/2014   DOE (dyspnea on exertion) 08/19/2014   Severe obesity (BMI >= 40) ([place]) 04/19/2014   CAD status post CABG 41/28/7867   Metabolic acidosis 67/20/9470    Class: Acute   Acute on chronic renal insufficiency 02/02/2014   Near syncope 02/02/2014   Hypotension 02/02/2014   Rash 02/02/2014   Pruritus 02/02/2014    Class: Chronic   SOB (shortness of breath) 01/07/2014  Coumadin Rx post CABG 11/17/2013   Physical deconditioning 11/05/2013   S/P CABG x 3- 10/23/13 10/23/2013   Permanent atrial fibrillation (HCC) - CHA2DS2-VASc Score 6, on Eliquis 10/20/2013   Left bundle branch block (LBBB) on electrocardiogram 10/20/2013   Left main coronary artery disease 10/20/2013    Class: Hospitalized for   History of acute anterior wall myocardial infarction 10/20/2013   Ischemic cardiomyopathy 10/20/2013   Morbid obesity- BMI 48    Diabetic neuropathy ([place])    Acute kidney injury superimposed on chronic kidney disease ([place])     Post corneal transplant 08/05/2012   Obesity 12/20/2006   Type 2 diabetes mellitus with hyperlipidemia ([place]) 08/06/2006   Hyperlipidemia with target LDL less than 70 08/06/2006   Essential hypertension 08/06/2006   OSTEOARTHRITIS 08/06/2006   PCP:  Ginger Organ., MD Pharmacy:   [place], [place] [address] Phone: [phone] Fax: [phone]  [facility] ([place], [place] [facility] [address] [place] [address] Phone: [phone] Fax: [phone]     Social Determinants of Health (SDOH) Interventions    Readmission Risk Interventions     No data to display

## 2022-06-05 NOTE — Plan of Care (Signed)

## 2022-06-05 NOTE — Progress Notes (Signed)
Heart Failure Navigator Progress Note  Assessed for Heart & Vascular TOC clinic readiness.  Prior to hospitalization care established with AHF clinic and Dr. Aundra Dubin. Next AHF appt scheduled for 12/13.   HF Navigation team will sign-off.   Pricilla Holm, MSN, RN Heart Failure Nurse Navigator

## 2022-06-06 ENCOUNTER — Ambulatory Visit (HOSPITAL_COMMUNITY): Admit: 2022-06-06 | Payer: Medicare Other | Admitting: Cardiology

## 2022-06-06 ENCOUNTER — Encounter (HOSPITAL_COMMUNITY)
Admission: EM | Disposition: A | Discharge status: Skilled Nursing Facility | Payer: Self-pay | Source: Home / Self Care | Attending: Internal Medicine

## 2022-06-06 ENCOUNTER — Inpatient Hospital Stay (HOSPITAL_COMMUNITY): Payer: Medicare Other

## 2022-06-06 ENCOUNTER — Encounter (HOSPITAL_COMMUNITY): Payer: Self-pay | Admitting: Family Medicine

## 2022-06-06 ENCOUNTER — Inpatient Hospital Stay (HOSPITAL_COMMUNITY): Payer: Medicare Other | Admitting: Certified Registered Nurse Anesthetist

## 2022-06-06 DIAGNOSIS — R7881 Bacteremia: Secondary | ICD-10-CM | POA: Diagnosis not present

## 2022-06-06 DIAGNOSIS — N179 Acute kidney failure, unspecified: Secondary | ICD-10-CM

## 2022-06-06 DIAGNOSIS — I079 Rheumatic tricuspid valve disease, unspecified: Secondary | ICD-10-CM | POA: Diagnosis not present

## 2022-06-06 DIAGNOSIS — I340 Nonrheumatic mitral (valve) insufficiency: Secondary | ICD-10-CM | POA: Diagnosis not present

## 2022-06-06 DIAGNOSIS — I38 Endocarditis, valve unspecified: Secondary | ICD-10-CM | POA: Diagnosis not present

## 2022-06-06 DIAGNOSIS — J449 Chronic obstructive pulmonary disease, unspecified: Secondary | ICD-10-CM

## 2022-06-06 DIAGNOSIS — I110 Hypertensive heart disease with heart failure: Secondary | ICD-10-CM | POA: Diagnosis not present

## 2022-06-06 DIAGNOSIS — I081 Rheumatic disorders of both mitral and tricuspid valves: Secondary | ICD-10-CM | POA: Diagnosis not present

## 2022-06-06 DIAGNOSIS — I2510 Atherosclerotic heart disease of native coronary artery without angina pectoris: Secondary | ICD-10-CM

## 2022-06-06 DIAGNOSIS — I509 Heart failure, unspecified: Secondary | ICD-10-CM

## 2022-06-06 DIAGNOSIS — I5023 Acute on chronic systolic (congestive) heart failure: Secondary | ICD-10-CM | POA: Diagnosis not present

## 2022-06-06 DIAGNOSIS — N189 Chronic kidney disease, unspecified: Secondary | ICD-10-CM

## 2022-06-06 DIAGNOSIS — Z87891 Personal history of nicotine dependence: Secondary | ICD-10-CM

## 2022-06-06 DIAGNOSIS — I4821 Permanent atrial fibrillation: Secondary | ICD-10-CM | POA: Diagnosis not present

## 2022-06-06 HISTORY — PX: TEE WITHOUT CARDIOVERSION: SHX5443

## 2022-06-06 LAB — CBC
HCT: 28.5 % — ABNORMAL LOW (ref 36.0–46.0)
Hemoglobin: 9.7 g/dL — ABNORMAL LOW (ref 12.0–15.0)
MCH: 28.6 pg (ref 26.0–34.0)
MCHC: 34 g/dL (ref 30.0–36.0)
MCV: 84.1 fL (ref 80.0–100.0)
Platelets: 225 K/uL (ref 150–400)
RBC: 3.39 MIL/uL — ABNORMAL LOW (ref 3.87–5.11)
RDW: 15.9 % — ABNORMAL HIGH (ref 11.5–15.5)
WBC: 7.7 K/uL (ref 4.0–10.5)
nRBC: 0.3 % — ABNORMAL HIGH (ref 0.0–0.2)

## 2022-06-06 LAB — BASIC METABOLIC PANEL
Anion gap: 14 (ref 5–15)
BUN: 65 mg/dL — ABNORMAL HIGH (ref 8–23)
CO2: 21 mmol/L — ABNORMAL LOW (ref 22–32)
Calcium: 8.8 mg/dL — ABNORMAL LOW (ref 8.9–10.3)
Chloride: 94 mmol/L — ABNORMAL LOW (ref 98–111)
Creatinine, Ser: 2.3 mg/dL — ABNORMAL HIGH (ref 0.44–1.00)
GFR, Estimated: 21 mL/min — ABNORMAL LOW (ref >60)
Glucose, Bld: 141 mg/dL — ABNORMAL HIGH (ref 70–99)
Potassium: 3.5 mmol/L (ref 3.5–5.1)
Sodium: 129 mmol/L — ABNORMAL LOW (ref 135–145)

## 2022-06-06 LAB — GLUCOSE, CAPILLARY
Glucose-Capillary: 118 mg/dL — ABNORMAL HIGH (ref 70–99)
Glucose-Capillary: 154 mg/dL — ABNORMAL HIGH (ref 70–99)
Glucose-Capillary: 154 mg/dL — ABNORMAL HIGH (ref 70–99)
Glucose-Capillary: 188 mg/dL — ABNORMAL HIGH (ref 70–99)

## 2022-06-06 SURGERY — ECHOCARDIOGRAM, TRANSESOPHAGEAL
Anesthesia: Monitor Anesthesia Care

## 2022-06-06 MED ORDER — SODIUM CHLORIDE 0.9 % IV SOLN: INTRAVENOUS | Status: DC

## 2022-06-06 MED ORDER — PHENYLEPHRINE HCL (PRESSORS) 10 MG/ML IV SOLN
INTRAVENOUS | Status: DC | PRN
  Administered 2022-06-06 (×2): 120 ug via INTRAVENOUS

## 2022-06-06 MED ORDER — GERHARDT'S BUTT CREAM
TOPICAL_CREAM | Freq: Two times a day (BID) | CUTANEOUS | Status: DC
  Administered 2022-06-06 – 2022-06-07 (×2): 1 via TOPICAL
  Filled 2022-06-06: qty 1

## 2022-06-06 MED ORDER — PROPOFOL 500 MG/50ML IV EMUL
INTRAVENOUS | Status: DC | PRN
  Administered 2022-06-06: 75 ug/kg/min via INTRAVENOUS

## 2022-06-06 MED ORDER — SODIUM CHLORIDE 0.9 % IV SOLN
2.0000 g | Freq: Two times a day (BID) | INTRAVENOUS | Status: DC
  Administered 2022-06-06 – 2022-06-10 (×9): 2 g via INTRAVENOUS
  Filled 2022-06-06 (×10): qty 20

## 2022-06-06 MED ORDER — POTASSIUM CHLORIDE CRYS ER 20 MEQ PO TBCR
40.0000 meq | EXTENDED_RELEASE_TABLET | Freq: Once | ORAL | Status: AC
  Administered 2022-06-06: 40 meq via ORAL
  Filled 2022-06-06: qty 2

## 2022-06-06 MED ORDER — PROPOFOL 10 MG/ML IV BOLUS
INTRAVENOUS | Status: DC | PRN
  Administered 2022-06-06 (×3): 10 mg via INTRAVENOUS

## 2022-06-06 MED ORDER — FUROSEMIDE 10 MG/ML IJ SOLN
80.0000 mg | Freq: Two times a day (BID) | INTRAMUSCULAR | Status: AC
  Administered 2022-06-06 (×2): 80 mg via INTRAVENOUS
  Filled 2022-06-06 (×2): qty 8

## 2022-06-06 MED ORDER — LIDOCAINE 2% (20 MG/ML) 5 ML SYRINGE
INTRAMUSCULAR | Status: DC | PRN
  Administered 2022-06-06: 60 mg via INTRAVENOUS

## 2022-06-06 NOTE — Progress Notes (Signed)
  Request seen for disc aspiration.  Images and chart reviewed by Dr. Estanislado Pandy.  MRI is poor quality due to motion artifact. He feels disc aspiration will be low yeild and the risks outweigh benefit. Also, the area of fluid is right behind the aorta, he also feels risks outweigh benefit.  He discussed this with Dr. [place] Bali.  Recommend continue antibiotic therapy and if patient does not clinically improve, recommend repeat MRI with contrast and consider sedative to limit motion.  Nathan Stallworth S Verner Kopischke PA-C 06/06/2022 9:10 AM

## 2022-06-06 NOTE — Progress Notes (Addendum)
[facility] Infectious Diseases Follow Up Note  Patient Identification: Patient Name: LAINI URICK MRN: [id number] [place] Date: 06/02/2022  3:09 PM Age: 81 y.o.Today's Date: 06/06/2022  Reason for Visit: E faecalis bacteremia  Principal Problem:   Acute on chronic systolic CHF (congestive heart failure) (HCC) Active Problems:   Type 2 diabetes mellitus with hyperlipidemia (HCC)   Essential hypertension   Permanent atrial fibrillation (HCC) - CHA2DS2-VASc Score 6, on Eliquis   Acute kidney injury superimposed on chronic kidney disease (HCC)   CAD status post CABG   Hypothyroidism   Chronic respiratory failure with hypoxia (HCC)   UTI (urinary tract infection)   Class 3 obesity (HCC)   Bacteremia   Antibiotics:  Ceftriaxone 10/6 Cephalexin 10/7 Ampicillin 10/7-c  Lines/Hardwares: AICD, clipping of atrial appendage, rt and left  hip THA  Interval Events: Continues to be afebrile, no leukocytosis   Assessment 81 year old female with PMH of CAD status post CABG and atrial appendage clipping, A-fib on AC, CHF/cardiomyopathy status post ICD, COPD admitted with UTI symptoms, malaise and leg swelling with sepsis presentation.  Found to have  # E faecalis bacteremia, likely urinary source including # Native tricuspid valve endocarditis and  # Discitis/osteomyelitis at T8-T9 with a small fluid collection along the left lateral aspect of the T8-T9 measuring approximately 1.9 x 1.3 cm -discussed with interventional radiologist Dr Estanislado Pandy, aspiration/biopsy with more risk than benefit given miniscule size  Repeat blood culture 10/8 no growth in 1 day TEE prelim 10/10  tricuspid valve vegetation, Pacer wires in RV do not show vegetation.    # CKD   Recommendations We will change antibiotics to IV ampicillin and ceftriaxone for E faecalis endocarditis Follow-up repeat blood cultures for clearance Engage EP due to bacteremia and  AICD which is likely involved Monitor CBC and BMP  D/w DR Arrien Following   Rest of the management as per the primary team. Thank you for the consult. Please page with pertinent questions or concerns.  ______________________________________________________________________ Subjective patient seen and examined at the bedside.  Past Medical History:  Diagnosis Date   Acute myocardial infarction of anterior wall ([place]) 10/20/2013   Afib, LBBB   Acute pulmonary edema with congestive heart failure ([place]) 10/20/2013   Resolved   AICD (automatic cardioverter/defibrillator) present    Arthritis    Atrial fibrillation ([place]) 10/20/2013   On Warfarin   CAD, multiple vessel 10/20/2013   LM, LAD & RCA --> CABG; MYOVIEW 12/'15: Intermediate Risk would large anterior, anteroapical segment the apex possible infarct and peri-infarct ischemia   CHF (congestive heart failure) (HCC)    EF 40-45%.   Chronic kidney disease    COPD (chronic obstructive pulmonary disease) (HCC)    Diabetic neuropathy (HCC)    Heart murmur    Likely related to aortic sclerosis   Hyperlipidemia    Hypertension    Hypothyroidism    Ischemic cardiomyopathy - Notable Improvement in EF post CABG 10/20/2013   Echo 2/23: EF 20-25%; mild LVH. anteroseptal Akinesis; mid-apical anterior, inferior & inferoseptal + apical-lateral akinesis; G 1 DDysfxn.; Mild-Mod LA dil; mild MR; Mod PHTN;; F/u Echo 12/2013: EF 40-45%, septal & apical HK, mild LVH; Mod LA dilation   Left bundle branch block (LBBB) on electrocardiogram    Mild mitral regurgitation by prior echocardiogram    Mild-Mod MR on Echo   Morbid obesity ([place])    OSTEOARTHRITIS 08/06/2006   S/P CABG x 3 10/23/2013   LIMA to LAD, SVG to OM2, SVG to RPLB,  EVH via right thigh   Type II diabetes mellitus with complication ([place]) 54/27/0623   off rx since heart attack 12/15-dr told could stay off if keep cbg under 150   UTI (lower urinary tract infection) 09/29/2014   ongoing  now. started rx 09/28/14   Past Surgical History:  Procedure Laterality Date   ABDOMINAL HYSTERECTOMY     AV NODE ABLATION N/A 05/19/2020   Procedure: AV NODE ABLATION;  Surgeon: Constance Haw, MD;  Location: [place] [facility];  Service: Cardiovascular;  Laterality: N/A;   BIV ICD INSERTION CRT-D N/A 07/28/2019   Procedure: BIV ICD INSERTION CRT-D;  Surgeon: Constance Haw, MD;  Location: [place] [facility];  Service: Cardiovascular;  Laterality: N/A;   BREAST BIOPSY Right 08/04/2016    fibroadenoma with calcifications    BREAST BIOPSY Right 07/16/2014   fibroadenoma with calcifications and atypical   BREAST EXCISIONAL BIOPSY Right 10/06/2014   atypical lobular hyperplasia    BREAST LUMPECTOMY W/ NEEDLE LOCALIZATION Right 10/06/2014   Dr Georgette Dover   BREAST LUMPECTOMY WITH NEEDLE LOCALIZATION Right 10/06/2014   Procedure: RIGHT BREAST LUMPECTOMY WITH NEEDLE LOCALIZATION;  Surgeon: Donnie Mesa, MD;  Location: [place];  Service: General;  Laterality: Right;   CARDIOVERSION N/A 10/22/2019   Procedure: CARDIOVERSION;  Surgeon: Larey Dresser, MD;  Location: [facility] ENDOSCOPY;  Service: Cardiovascular;  Laterality: N/A;   CARPAL TUNNEL RELEASE Right 04/13/2022   Procedure: RIGHT OPEN CARPAL TUNNEL RELEASE;  Surgeon: Mcarthur Rossetti, MD;  Location: [place];  Service: Orthopedics;  Laterality: Right;   CLIPPING OF ATRIAL APPENDAGE N/A 10/23/2013   Procedure: CLIPPING OF ATRIAL APPENDAGE;  Surgeon: Rexene Alberts, MD;  Location: [place];  Service: Open Heart Surgery;  Laterality: N/A;   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   CORONARY ARTERY BYPASS GRAFT N/A 10/23/2013   Procedure: CORONARY ARTERY BYPASS GRAFTING (CABG);  Surgeon: Rexene Alberts, MD;  Location: [place];  Service: Open Heart Surgery: LIMA-LAD, SVG-OM2, SVG-RPL   INTRAOPERATIVE TRANSESOPHAGEAL ECHOCARDIOGRAM N/A 10/23/2013   Procedure: INTRAOPERATIVE TRANSESOPHAGEAL ECHOCARDIOGRAM;  Surgeon: Rexene Alberts, MD;  Location: [place];   Service: Open Heart Surgery;  Laterality: N/A;   LEFT HEART CATHETERIZATION WITH CORONARY ANGIOGRAM N/A 10/20/2013   Procedure: LEFT HEART CATHETERIZATION WITH CORONARY ANGIOGRAM;  Surgeon: Leonie Man, MD;  Location: [facility] CATH LAB;  Service: Cardiovascular: Distal LM ~70%, LAD - ostial 70%, prox 80, 95 & 95%; RCA prox 70%, mid 80%; minimal Cx disease   LEFT HEART CATHETERIZATION WITH CORONARY/GRAFT ANGIOGRAM N/A 09/08/2014   Procedure: LEFT HEART CATHETERIZATION WITH Beatrix Fetters;  Surgeon: Leonie Man, MD;  Location: [facility] CATH LAB: Indication: Abnormal Myoview. Occluded LAD after 70 and 80% left main. Diffuse RCA disease. Widely patent grafts to Moderately diseased artery vessels. Mild-moderate LV dysfunction and chronic A. fib.   NM MYOVIEW LTD  08/14/2014   Lexi scan: INTERMEDIATE RISK - large, severe intensity perfusion defect in anterior wall, apex and inferior apex that is partly reversible. This suggests either scar or possible hibernating myocardium. Nondeviated.-> Likely scar. No change in coronary anatomy with patent grafts.   TOTAL HIP ARTHROPLASTY  2010, 2011   right 2010, left 2011   TRANSTHORACIC ECHOCARDIOGRAM  12/2013   Mildly dilated left ventricle with mild to moderately reduced function. EF 40-45% (notable improvement from February 2015). Septal and apical hypokinesis. Mild LA dilation.   TRANSTHORACIC ECHOCARDIOGRAM  11/11/2019   EF 20% with severely dysfunction. Global HK. Unable to  determine diastolic pressures. Mild RV function-with mildly elevated PAP-RAP estimated 15 mmHg.. Moderate LA dilation, mild RA dilation.   Vitals BP (!) 128/56 (BP Location: Right Arm)   Pulse 72   Temp (!) 97.5 F (36.4 C) (Oral)   Resp 20   Ht '5\' 2"'$  (1.575 m)   Wt 102.7 kg   SpO2 100%   BMI 41.41 kg/m     Physical Exam She was seen soon after her TEE and appeared to be somewhat sleepy although arousable to voice On Nasal cannula and appears comfortable  Normal; heart  rate but irregular rhythm Normal breath sounds Abdomen - soft, non tender, non distended Lower extremities with mild pedal edema.  Follows commands  Pertinent Microbiology Results for orders placed or performed during the hospital encounter of 06/02/22  Urine Culture     Status: Abnormal   Collection Time: 06/03/22  4:54 AM   Specimen: Urine, Catheterized  Result Value Ref Range Status   Specimen Description URINE, CATHETERIZED  Final   Special Requests   Final    NONE Performed at [facility], [address], [place], [place] [address]    Culture 80,000 COLONIES/mL ENTEROCOCCUS FAECALIS (A)  Final   Report Status 06/05/2022 FINAL  Final   Organism ID, Bacteria ENTEROCOCCUS FAECALIS (A)  Final      Susceptibility   Enterococcus faecalis - MIC*    AMPICILLIN <=2 SENSITIVE Sensitive     NITROFURANTOIN <=16 SENSITIVE Sensitive     VANCOMYCIN 1 SENSITIVE Sensitive     * 80,000 COLONIES/mL ENTEROCOCCUS FAECALIS  Blood culture (routine x 2)     Status: Abnormal   Collection Time: 06/03/22  5:11 AM   Specimen: BLOOD  Result Value Ref Range Status   Specimen Description BLOOD RIGHT ANTECUBITAL  Final   Special Requests   Final    BOTTLES DRAWN AEROBIC AND ANAEROBIC Blood Culture adequate volume   Culture  Setup Time   Final    GRAM POSITIVE COCCI IN CHAINS IN BOTH AEROBIC AND ANAEROBIC BOTTLES CRITICAL RESULT CALLED TO, READ BACK BY AND VERIFIED WITH: Pamala Hurry [id number] AT 2013 BY EC Performed at [facility], [place] [address], [place], [place] [address]    Culture ENTEROCOCCUS FAECALIS (A)  Final   Report Status 06/05/2022 FINAL  Final   Organism ID, Bacteria ENTEROCOCCUS FAECALIS  Final      Susceptibility   Enterococcus faecalis - MIC*    AMPICILLIN <=2 SENSITIVE Sensitive     VANCOMYCIN 1 SENSITIVE Sensitive     GENTAMICIN SYNERGY SENSITIVE Sensitive     * ENTEROCOCCUS FAECALIS  Blood Culture ID Panel (Reflexed)     Status: Abnormal   Collection Time:  06/03/22  5:11 AM  Result Value Ref Range Status   Enterococcus faecalis DETECTED (A) NOT DETECTED Final    Comment: CRITICAL RESULT CALLED TO, READ BACK BY AND VERIFIED WITH: C/  PHARMD LYDIA CHEN [id number] AT 2013 BY EC CRITICAL COMMENT ADDED BY A. LAFRANCE 06/03/22 2132     Enterococcus Faecium NOT DETECTED NOT DETECTED Final   Listeria monocytogenes NOT DETECTED NOT DETECTED Final   Staphylococcus species NOT DETECTED NOT DETECTED Final   Staphylococcus aureus (BCID) NOT DETECTED NOT DETECTED Final   Staphylococcus epidermidis NOT DETECTED NOT DETECTED Final   Staphylococcus lugdunensis NOT DETECTED NOT DETECTED Final   Streptococcus species NOT DETECTED NOT DETECTED Final   Streptococcus agalactiae NOT DETECTED NOT DETECTED Final   Streptococcus pneumoniae NOT DETECTED NOT  DETECTED Final   Streptococcus pyogenes NOT DETECTED NOT DETECTED Final   A.calcoaceticus-baumannii NOT DETECTED NOT DETECTED Final   Bacteroides fragilis NOT DETECTED NOT DETECTED Final   Enterobacterales NOT DETECTED NOT DETECTED Final   Enterobacter cloacae complex NOT DETECTED NOT DETECTED Final   Escherichia coli NOT DETECTED NOT DETECTED Final   Klebsiella aerogenes NOT DETECTED NOT DETECTED Final   Klebsiella oxytoca NOT DETECTED NOT DETECTED Final   Klebsiella pneumoniae NOT DETECTED NOT DETECTED Final   Proteus species NOT DETECTED NOT DETECTED Final   Salmonella species NOT DETECTED NOT DETECTED Final   Serratia marcescens NOT DETECTED NOT DETECTED Final   Haemophilus influenzae NOT DETECTED NOT DETECTED Final   Neisseria meningitidis NOT DETECTED NOT DETECTED Final   Pseudomonas aeruginosa NOT DETECTED NOT DETECTED Final   Stenotrophomonas maltophilia NOT DETECTED NOT DETECTED Final   Candida albicans NOT DETECTED NOT DETECTED Final   Candida auris NOT DETECTED NOT DETECTED Final   Candida glabrata NOT DETECTED NOT DETECTED Final   Candida krusei NOT DETECTED NOT DETECTED Final   Candida  parapsilosis NOT DETECTED NOT DETECTED Final   Candida tropicalis NOT DETECTED NOT DETECTED Final   Cryptococcus neoformans/gattii NOT DETECTED NOT DETECTED Final   Vancomycin resistance NOT DETECTED NOT DETECTED Final    Comment: Performed at [facility], [address], [place], [place] [address]  Blood culture (routine x 2)     Status: Abnormal   Collection Time: 06/03/22  5:15 AM   Specimen: BLOOD  Result Value Ref Range Status   Specimen Description BLOOD SITE NOT SPECIFIED  Final   Special Requests   Final    BOTTLES DRAWN AEROBIC AND ANAEROBIC Blood Culture adequate volume   Culture  Setup Time   Final    GRAM POSITIVE COCCI IN CHAINS IN BOTH AEROBIC AND ANAEROBIC BOTTLES CRITICAL VALUE NOTED.  VALUE IS CONSISTENT WITH PREVIOUSLY REPORTED AND CALLED VALUE.    Culture (A)  Final    ENTEROCOCCUS FAECALIS SUSCEPTIBILITIES PERFORMED ON PREVIOUS CULTURE WITHIN THE LAST 5 DAYS. Performed at [facility], [place] [address] [place], [place] [address]    Report Status 06/05/2022 FINAL  Final  Culture, blood (Routine X 2) w Reflex to ID Panel     Status: None (Preliminary result)   Collection Time: 06/04/22  7:05 AM   Specimen: BLOOD  Result Value Ref Range Status   Specimen Description BLOOD VENOUS  Final   Special Requests   Final    BOTTLES DRAWN AEROBIC AND ANAEROBIC Blood Culture results may not be optimal due to an inadequate volume of blood received in culture bottles   Culture   Final    NO GROWTH 1 DAY Performed at [facility], [place] [address] [place], [place] [address]    Report Status PENDING  Incomplete  Culture, blood (Routine X 2) w Reflex to ID Panel     Status: None (Preliminary result)   Collection Time: 06/04/22  7:06 AM   Specimen: BLOOD  Result Value Ref Range Status   Specimen Description BLOOD LEFT ANTECUBITAL  Final   Special Requests   Final    BOTTLES DRAWN AEROBIC AND ANAEROBIC Blood Culture results may not be optimal due to an  inadequate volume of blood received in culture bottles   Culture   Final    NO GROWTH 1 DAY Performed at [facility], [place] [address] [place], [place] [address]    Report Status PENDING  Incomplete  Pertinent Lab.    Latest Ref Rng & Units 06/06/2022   12:56 AM 06/05/2022    2:42 AM 06/04/2022    7:05 AM  CBC  WBC 4.0 - 10.5 K/uL 7.7  9.3  10.4   Hemoglobin 12.0 - 15.0 g/dL 9.7  8.4  9.2   Hematocrit 36.0 - 46.0 % 28.5  25.1  27.2   Platelets 150 - 400 K/uL 225  206  216       Latest Ref Rng & Units 06/06/2022   12:56 AM 06/05/2022    2:42 AM 06/04/2022    7:05 AM  CMP  Glucose 70 - 99 mg/dL 141  156  174   BUN 8 - 23 mg/dL 65  62  50   Creatinine 0.44 - 1.00 mg/dL 2.30  2.25  1.91   Sodium 135 - 145 mmol/L 129  134  136   Potassium 3.5 - 5.1 mmol/L 3.5  4.1  4.0   Chloride 98 - 111 mmol/L 94  99  96   CO2 22 - 32 mmol/L '21  24  27   '$ Calcium 8.9 - 10.3 mg/dL 8.8  8.5  9.6   Total Protein 6.5 - 8.1 g/dL  6.1  6.4   Total Bilirubin 0.3 - 1.2 mg/dL  1.1  1.5   Alkaline Phos 38 - 126 U/L  97  109   AST 15 - 41 U/L  28  38   ALT 0 - 44 U/L  26  27      Pertinent Imaging today Plain films and CT images have been personally visualized and interpreted; radiology reports have been reviewed. Decision making incorporated into the Impression / Recommendations.  MR THORACIC SPINE WO CONTRAST  Result Date: 06/05/2022 CLINICAL DATA:  Back pain.  Concern for osteomyelitis. EXAM: MRI THORACIC SPINE WITHOUT CONTRAST TECHNIQUE: Multiplanar, multisequence MR imaging of the thoracic spine was performed. No intravenous contrast was administered. COMPARISON:  None Available. FINDINGS: Alignment: Grade 1 anterolisthesis of C7 on T1, T2 on T3. Mild retrolisthesis of T12 on L1. Vertebrae: There is T2/FLAIR hyperintense signal abnormality within the inferior and superior aspects of the T8 and T9 vertebral body levels (series 20, image 11), as well as the T8-T9 disc space. There is likely a  small fluid collection along the left lateral aspect of the T8-T9 vertebral body level measuring approximately 1.9 x 1.3 cm (series 19, images 25-28). There is no definite evidence of epidural abscess at this level, but assessment is markedly limited due to the lack of IV contrast and motion artifact. Cord: Assessment of the spinal cord is markedly limited due to the degree of motion artifact. Paraspinal and other soft tissues: Small bilateral pleural effusions. See above for description of paraspinal fluid collections at the T8-T9 level on the left. Disc levels: There are multilevel disc bulges without definite evidence of high-grade spinal canal stenosis. There is likely at least moderate neural foraminal stenosis at T8-T9 on the left. IMPRESSION: 1. Markedly limited exam due to the degree of motion artifact and lack of IV contrast. 2. Within this limitation, there are findings that are worrisome for discitis/osteomyelitis at T8-T9 with a small fluid collection along the left lateral aspect of the T8-T9 vertebral body level measuring approximately 1.9 x 1.3 cm. No definite evidence of epidural abscess, but assessment is markedly limited due to the degree of motion artifact. Recommend further evaluation with a contrast enhanced exam if the patient is able to limit movement at time of  imaging. 3. Small bilateral pleural effusions Electronically Signed   By: Marin Roberts M.D.   On: 06/05/2022 16:00   ECHOCARDIOGRAM COMPLETE  Result Date: 06/03/2022    ECHOCARDIOGRAM REPORT   Patient Name:   MANNA GOSE Date of Exam: 06/03/2022 Medical Rec #:  [id number]     Height:       62.0 in Accession #:    [id number]    Weight:       220.0 lb Date of Birth:  1941-08-15     BSA:          1.991 m Patient Age:    89 years      BP:           140/59 mmHg Patient Gender: F             HR:           73 bpm. Exam Location:  Inpatient Procedure: 2D Echo, Color Doppler, Cardiac Doppler and Intracardiac            Opacification Agent  Indications:    CHF  History:        Patient has prior history of Echocardiogram examinations, most                 recent 08/24/2021. CHF, CAD, Arrythmias:Atrial Fibrillation and                 LBBB, Signs/Symptoms:Murmur; Risk Factors:Hypertension and                 Diabetes.  Sonographer:    Memory Argue Referring Phys: [id number] [place]  1. No left ventricular thrombus (Definity contrast used). Left ventricular ejection fraction, by estimation, is 25 to 30%. The left ventricle has severely decreased function. The left ventricle demonstrates global hypokinesis. The left ventricular internal cavity size was mildly dilated. Left ventricular diastolic function could not be evaluated. There is mild dyskinesis of the left ventricular, entire apical segment.  2. Right ventricular systolic function is moderately reduced. The right ventricular size is normal. There is moderately elevated pulmonary artery systolic pressure. The estimated right ventricular systolic pressure is 82.4 mmHg.  3. Left atrial size was moderately dilated.  4. Right atrial size was mildly dilated.  5. The mitral valve is normal in structure. Mild mitral valve regurgitation. Moderate mitral annular calcification.  6. The tricuspid valve is abnormal. Tricuspid valve regurgitation is moderate to severe.  7. The aortic valve is tricuspid. Aortic valve regurgitation is not visualized. Aortic valve sclerosis is present, with no evidence of aortic valve stenosis. Comparison(s): No significant change from prior study. Prior images reviewed side by side. FINDINGS  Left Ventricle: No left ventricular thrombus (Definity contrast used). Left ventricular ejection fraction, by estimation, is 25 to 30%. The left ventricle has severely decreased function. The left ventricle demonstrates global hypokinesis. Mild dyskinesis of the left ventricular, entire apical segment. The left ventricular internal cavity size was mildly dilated. There is no  left ventricular hypertrophy. Abnormal (paradoxical) septal motion, consistent with RV pacemaker. Left ventricular diastolic function could not be evaluated due to atrial fibrillation. Left ventricular diastolic function could not be evaluated.  LV Wall Scoring: The entire apex is dyskinetic. The mid anteroseptal segment is akinetic. The posterior wall, mid anterolateral segment, and mid inferior segment are hypokinetic. The anterior wall, basal anteroseptal segment, basal anterolateral segment, mid inferoseptal segment, basal inferior segment, and basal inferoseptal segment are normal. Right Ventricle: The right ventricular size is normal.  No increase in right ventricular wall thickness. Right ventricular systolic function is moderately reduced. There is moderately elevated pulmonary artery systolic pressure. The tricuspid regurgitant velocity is 3.73 m/s, and with an assumed right atrial pressure of 3 mmHg, the estimated right ventricular systolic pressure is 01.7 mmHg. Left Atrium: Left atrial size was moderately dilated. Right Atrium: Right atrial size was mildly dilated. Pericardium: There is no evidence of pericardial effusion. Mitral Valve: The mitral valve is normal in structure. Moderate mitral annular calcification. Mild mitral valve regurgitation. Tricuspid Valve: The tricuspid valve is abnormal. Tricuspid valve regurgitation is moderate to severe. Aortic Valve: The aortic valve is tricuspid. Aortic valve regurgitation is not visualized. Aortic valve sclerosis is present, with no evidence of aortic valve stenosis. Aortic valve mean gradient measures 8.0 mmHg. Aortic valve peak gradient measures 15.2 mmHg. Aortic valve area, by VTI measures 1.06 cm. Pulmonic Valve: The pulmonic valve was normal in structure. Pulmonic valve regurgitation is not visualized. Aorta: The aortic root and ascending aorta are structurally normal, with no evidence of dilitation. IAS/Shunts: No atrial level shunt detected by color  flow Doppler. Additional Comments: A device lead is visualized in the right ventricle.  LEFT VENTRICLE PLAX 2D LVIDd:         5.60 cm   Diastology LVIDs:         4.80 cm   LV e' medial:    4.97 cm/s LV PW:         1.10 cm   LV E/e' medial:  27.0 LV IVS:        1.00 cm   LV e' lateral:   8.55 cm/s LVOT diam:     2.10 cm   LV E/e' lateral: 15.7 LV SV:         40 LV SV Index:   20 LVOT Area:     3.46 cm  RIGHT VENTRICLE RV S prime:     7.93 cm/s LEFT ATRIUM             Index        RIGHT ATRIUM           Index LA diam:        4.20 cm 2.11 cm/m   RA Area:     17.60 cm LA Vol (A2C):   80.8 ml 40.59 ml/m  RA Volume:   43.80 ml  22.00 ml/m LA Vol (A4C):   88.2 ml 44.31 ml/m LA Biplane Vol: 84.8 ml 42.60 ml/m  AORTIC VALVE AV Area (Vmax):    1.16 cm AV Area (Vmean):   1.10 cm AV Area (VTI):     1.06 cm AV Vmax:           195.00 cm/s AV Vmean:          129.000 cm/s AV VTI:            0.375 m AV Peak Grad:      15.2 mmHg AV Mean Grad:      8.0 mmHg LVOT Vmax:         65.40 cm/s LVOT Vmean:        41.100 cm/s LVOT VTI:          0.115 m LVOT/AV VTI ratio: 0.31  AORTA Ao Root diam: 3.10 cm MITRAL VALVE                TRICUSPID VALVE MV Area (PHT): 5.84 cm     TR Peak grad:   55.7 mmHg MV Decel Time: 130  msec     TR Vmax:        373.00 cm/s MR Peak grad: 80.3 mmHg MR Vmax:      448.00 cm/s   SHUNTS MV E velocity: 134.00 cm/s  Systemic VTI:  0.12 m MV A velocity: 62.80 cm/s   Systemic Diam: 2.10 cm MV E/A ratio:  2.13 Mihai Croitoru MD Electronically signed by Sanda Klein MD Signature Date/Time: 06/03/2022/1:04:15 PM    Final    CT ABDOMEN PELVIS WO CONTRAST  Result Date: 06/03/2022 CLINICAL DATA:  81 year old female with left lower quadrant abdominal pain. History of recurrent UTI and sepsis. EXAM: CT ABDOMEN AND PELVIS WITHOUT CONTRAST TECHNIQUE: Multidetector CT imaging of the abdomen and pelvis was performed following the standard protocol without IV contrast. RADIATION DOSE REDUCTION: This exam was performed  according to the departmental dose-optimization program which includes automated exposure control, adjustment of the mA and/or kV according to patient size and/or use of iterative reconstruction technique. COMPARISON:  Noncontrast CT Abdomen and Pelvis 05/19/2022. FINDINGS: Lower chest: Prior sternotomy. Cardiac pacemaker leads. Mild cardiomegaly. No pericardial effusion. Increased bilateral lung base opacity since last month appears related to new trace pleural effusions and patchy bilateral lower lobe opacity with borderline consolidation. The lung base involvement is fairly symmetric. Hepatobiliary: Stable noncontrast liver and gallbladder. Pancreas: Stable, negative. Spleen: Stable, negative. Adrenals/Urinary Tract: Normal adrenal glands. Noncontrast kidneys are stable and nonobstructed. No hydronephrosis or pararenal inflammation. No convincing nephrolithiasis. No hydroureter. Urinary bladder partially obscured by hip arthroplasty streak artifact, but evidence of indistinct bladder wall inflammation at the dome which is visible on series 3, image 64. Stomach/Bowel: Extensive large bowel diverticulosis from the transverse colon distally. The the hepatic flexure and mid transverse colon are more gas distended today. But there is no transition point, with retained gas and stool in redundant distal transverse and splenic flexure. Descending colon is decompressed. Rectosigmoid colon decompressed. Extensive diverticula but no definite active large bowel inflammation. Cecum on a lax mesentery. Diverticulosis of the terminal ileum (series 3, image 41) which is decompressed. Cecum is gas distended similar to the mid transverse colon. No cecal wall thickening. Appendix not identified. No pericecal inflammation. No free air or free fluid identified. Gas-filled but nondilated distal small bowel in the right abdomen. A small fat containing umbilical hernia appears stable and inconsequential. Negative stomach. Duodenum is  decompressed. Vascular/Lymphatic: Extensive Aortoiliac calcified atherosclerosis. Vascular patency is not evaluated in the absence of IV contrast. Normal caliber abdominal aorta. No lymphadenopathy identified. Reproductive: Uterus seems to be surgically absent. Ovaries are not identified. Other: Limited visualization of the pelvis from hip arthroplasty streak artifact as before. Musculoskeletal: Chronic spine degeneration with lumbosacral junction ankylosis and osteopenia. Chronic bilateral hip arthroplasty with subsequent streak artifact in the pelvis. Prior sternotomy. No acute osseous abnormality identified. IMPRESSION: 1. Increased lung base opacification since last month appears related to trace new pleural effusions and patchy opacity. Symmetry argues in favor of atelectasis, but acute lung base infection cannot be excluded. 2. Limited CT visualization of the pelvis secondary to artifact from bilateral hip arthroplasties. But the visible urinary bladder dome appears inflamed compatible with UTI and/or Cystitis. 3. No evidence of obstructive uropathy or ascending urinary infection on this noncontrast exam. 4. Widespread severe large bowel diverticulosis, and some distal small bowel diverticulosis also, but no active inflammation identified. Increased gaseous distension of the proximal large bowel might reflect Ileus. 5. Severe Aortic Atherosclerosis (ICD10-I70.0). Electronically Signed   By: Genevie Ann M.D.   On:  06/03/2022 04:49   DG Chest Port 1 View  Result Date: 06/03/2022 CLINICAL DATA:  Back pain.  No trauma. EXAM: PORTABLE CHEST 1 VIEW COMPARISON:  05/19/2022 FINDINGS: Cardiac pacemaker. Postoperative changes in the mediastinum. Cardiac enlargement. Mild vascular congestion. Mild interstitial changes suggesting early edema. Small left pleural effusion. No pneumothorax. Calcification of the aorta. IMPRESSION: Cardiac enlargement with mild vascular congestion and edema. Small left pleural effusion.  Electronically Signed   By: Lucienne Capers M.D.   On: 06/03/2022 02:13   DG Lumbar Spine 2-3 Views  Result Date: 06/03/2022 CLINICAL DATA:  Back pain EXAM: LUMBAR SPINE - 2-3 VIEW COMPARISON:  CT abdomen and pelvis 05/19/2022 FINDINGS: Five lumbar type vertebral bodies. Normal alignment. No acute vertebral compression deformities. No focal bone lesion or bone destruction. Degenerative changes with disc space narrowing and endplate osteophyte formation. Multilevel degenerative disc disease. Degenerative changes in the facet joints. Prominent vascular calcification in the aorta. Bilateral hip arthroplasties. IMPRESSION: Normal alignment. Degenerative changes. No acute displaced fractures identified. Electronically Signed   By: Lucienne Capers M.D.   On: 06/03/2022 02:12   DG Thoracic Spine 2 View  Result Date: 06/03/2022 CLINICAL DATA:  Back pain.  No trauma. EXAM: THORACIC SPINE 2 VIEWS COMPARISON:  Two-view chest 07/29/2019 FINDINGS: Normal alignment of the thoracic spine. Mild vertebral wedge deformities of the midthoracic region similar to prior study. No new vertebral compression is identified. Degenerative changes with narrowed disc spaces and endplate osteophyte formation throughout. No focal bone lesion or bone destruction. Bone cortex appears intact. No abnormal paraspinal soft tissue swelling. Mild thoracic scoliosis convex towards the right is unchanged. Postoperative changes noted in the mediastinum. IMPRESSION: Normal alignment of the thoracic spine. No acute displaced fractures identified. Degenerative changes. Electronically Signed   By: Lucienne Capers M.D.   On: 06/03/2022 02:10   DG Chest Port 1 View  Result Date: 05/19/2022 CLINICAL DATA:  Fever, weakness and fatigue. EXAM: PORTABLE CHEST 1 VIEW COMPARISON:  07/29/2019 FINDINGS: Stable cardiac enlargement, radiographic appearance biventricular pacer/ICD and evidence of prior CABG. Low lung volumes bilaterally. No overt edema,  airspace consolidation or pneumothorax. Stable chronic bilateral pleural thickening. IMPRESSION: Stable cardiac enlargement. Electronically Signed   By: Aletta Edouard M.D.   On: 05/19/2022 15:59   CT ABDOMEN PELVIS WO CONTRAST  Result Date: 05/19/2022 CLINICAL DATA:  Recurrent urinary tract infections and sepsis. Fatigue and weakness. EXAM: CT ABDOMEN AND PELVIS WITHOUT CONTRAST TECHNIQUE: Multidetector CT imaging of the abdomen and pelvis was performed following the standard protocol without IV contrast. RADIATION DOSE REDUCTION: This exam was performed according to the departmental dose-optimization program which includes automated exposure control, adjustment of the mA and/or kV according to patient size and/or use of iterative reconstruction technique. COMPARISON:  None Available. FINDINGS: Lower chest: No acute findings. Hepatobiliary: No mass visualized on this unenhanced exam. Gallbladder is unremarkable. No evidence of biliary ductal dilatation. Pancreas: No mass or inflammatory process visualized on this unenhanced exam. Spleen:  Within normal limits in size. Adrenals/Urinary tract: No evidence of urolithiasis or hydronephrosis. Distal ureters and bladder are not well visualized due to severe artifact from bilateral hip prostheses. Stomach/Bowel: No evidence of obstruction, inflammatory process, or abnormal fluid collections. Severe diverticulosis is seen in the transverse, descending, and sigmoid colon, however there is no evidence of diverticulitis. Vascular/Lymphatic: No pathologically enlarged lymph nodes identified. No evidence of abdominal aortic aneurysm. Aortic atherosclerotic calcification incidentally noted. Reproductive: Obscured by severe artifact through the lower pelvis from bilateral hip prostheses. Other:  None. Musculoskeletal:  No suspicious bone lesions identified. IMPRESSION: No evidence of urolithiasis, hydronephrosis, or other acute findings. Severe colonic diverticulosis,  without radiographic evidence of diverticulitis. Electronically Signed   By: Marlaine Hind M.D.   On: 05/19/2022 15:14    I spent 60  minutes for this patient encounter including review of prior medical records, coordination of care with primary/other specialist with greater than 50% of time being face to face/counseling and discussing diagnostics/treatment plan with the patient/family.  Electronically signed by:   Rosiland Oz, MD Infectious Disease Physician [facility] for Infectious Disease Pager: [phone]

## 2022-06-06 NOTE — Progress Notes (Signed)
Progress Note   Patient: ASHARA LOUNSBURY PZW:[id number] DOB: August 25, 1941 DOA: 06/02/2022     3 DOS: the patient was seen and examined on 06/06/2022   Brief hospital course: Mr. Kopecky was admitted to the hospital with the working diagnosis of decompensated heart failure.   81 yo female with the past medical history of coronary artery disease, atrial fibrillation, COPD, chronic hypoxemic respiratory failure and systolic heart failure who presented with dyspnea and edema. Reported worsening dyspnea and lower extremity edema at home, positive dysuria and back pain. On his initial physical examination his blood pressure was 150/68, HR 73, RR 20 and 02 saturation 99% on 3 L/min per [place], lungs with rales bilaterally, with no wheezing, heart with S1 and S2 present and rhythmic, abdomen with no distention and positive lower extremity edema.   Na 138, K 5,2 CL 100 bicarbonate 28 glucose 262 bun 46 cr 1,87  BNP 1,191  Wbc 14,0 hgb 9,9 plt 293   Chest radiograph with cardiomegaly, no infiltrates, small left pleural effusion, pacemaker with one ventricular lead and one defibrillator lead in place.   CT abdomen with inflammation at the urinary bladder, positive diverticulosis with no diverticulitis.   EKG 73 bpm, right axis deviation, qtc not prolonged, ventricular paced rhythm with underlying atrial fibrillation, no significant ST segment or T wave changes.   Patient was placed on IV furosemide and IV antibiotic therapy .   Blood culture positive for enterococcus, antibiotic therapy was changed to IV ampicillin ID consultation has recommended TEE and spine MRI.   10/10 TEE with tricuspid valve vegetation, consistent with endocarditis. Considering pacemaker in place EP was consulted.  MRI with findings worrisome for discitis and osteomyelitis at T 8 and T 9, no epidural abscess.    Assessment and Plan: * Acute on chronic systolic CHF (congestive heart failure) (HCC) Echocardiogram with reduced LV  systolic function with EF 25 to 30%, global hypokinesis, mild dyskinesis of the left ventricular entire apical segment. Moderate reduction in RV systolic function, RVSP 42,3 mmHg. Moderate to severe TR   Systolic blood pressure 536 to 140 mmHg.   Continue with bidil and metoprolol.  Diuresis with furosemide 80 mg IV q12 hrs  No SGLT 2 inh due to frequent urinary tract infections.   Endocarditis of tricuspid valve Enterococcus fecalis bacteremia, urinary tract infection  (4/4 bottles).  Sensitive to ampicillin.  Wbc is 7,7  Continue antibiotic therapy with IV ampicillin and ceftriaxone  TEE with tricuspid valve vegetation.  Lumbar spine MRI with discitis at T 8 and T 9 with small fluid collection along the left lateral aspect of T 8 and T 9 vertebral body level measuring 1,9 to 1,3 cm. No definitive epidural abscess, but limited assessment due to motion artifact.   Plan to continue antibiotic therapy with ampicillin and ceftriaxone.  Follow up with EP recommendations in regards of pace maker in place in the setting of endocarditis.   Permanent atrial fibrillation (HCC) - CHA2DS2-VASc Score 6, on Eliquis Continue reate control with metoprolol Anticoagulation with apixaban.   Acute kidney injury superimposed on chronic kidney disease ([place]) CKD stage IV, Hyponatremia  Renal function today with serum cr at 2,30 with K at 3,5 and serum bicarbonate at 21. Na 129   Plan to continue diuresis with furosemide and follow up renal function in am.  CAD status post CABG Patient with no chest pain.   Essential hypertension Continue blood pressure control with metoprolol and bidil Diuresis with IV furosemide.  Type 2 diabetes mellitus with hyperlipidemia (HCC) Continue insulin sliding scale for glucose cover and monitoring,  Continue with rosuvastatin.   Chronic respiratory failure with hypoxia (HCC) COPD with no signs of exacerbation Continue bronchodilator therapy, oxymetry  monitoring and supplemental 02 per [place] to keep 02 saturation 88% or greater.   Hypothyroidism Continue with levothyroxine   Class 3 obesity (HCC) Calculated BMI 40,2         Subjective: Patient with back pain, no chest pain or dyspnea, no nausea or vomiting,   Physical Exam: Vitals:   06/06/22 0830 06/06/22 0841 06/06/22 0921 06/06/22 1225  BP: 110/64 124/64 (!) 128/56 (!) 123/53  Pulse: 73 72 72 73  Resp: 20 (!) '21 20 19  '$ Temp:   (!) 97.5 F (36.4 C)   TempSrc:   Oral   SpO2: 99% 100% 100% 94%  Weight:      Height:       Neurology awake and alert ENT with no pallor Cardiovascular with S1 and S2 present with regular rhythm, positive murmur systolic at the left lower sternal border Respiratory with no rales or wheezing Abdomen with no distention  Positive lower extremity edema non potting  Data Reviewed:    Family Communication: I spoke with patient's granddaughter at the bedside, we talked in detail about patient's condition, plan of care and prognosis and all questions were addressed.   Disposition: Status is: Inpatient Remains inpatient appropriate because: IV antibiotic therapy   Planned Discharge Destination: SNF     Author: Tawni Millers, MD 06/06/2022 12:46 PM  For on call review [email or web address].

## 2022-06-06 NOTE — Progress Notes (Signed)
  Echocardiogram Echocardiogram Transesophageal has been performed.  Elaine [name] 06/06/2022, 8:19 AM

## 2022-06-06 NOTE — Transfer of Care (Signed)
Immediate Anesthesia Transfer of Care Note  Patient: [name]  Procedure(s) Performed: TRANSESOPHAGEAL ECHOCARDIOGRAM (TEE)  Patient Location: Endoscopy Unit  Anesthesia Type:MAC  Level of Consciousness: awake, patient cooperative and responds to stimulation  Airway & Oxygen Therapy: Patient Spontanous Breathing and Patient connected to nasal cannula oxygen  Post-op Assessment: Report given to RN and Post -op Vital signs reviewed and stable  Post vital signs: Reviewed and stable  Last Vitals:  Vitals Value Taken Time  BP 99/48 06/06/22 0815  Temp 35.8 C 06/06/22 0815  Pulse 73 06/06/22 0817  Resp 26 06/06/22 0817  SpO2 96 % 06/06/22 0817  Vitals shown include unvalidated device data.  Last Pain:  Vitals:   06/06/22 0815  TempSrc: Temporal  PainSc:       Patients Stated Pain Goal: 0 (71/59/53 9672)  Complications: No notable events documented.

## 2022-06-06 NOTE — Progress Notes (Signed)
Orthopedic Tech Progress Note Patient Details:  Elaine [name] 1940-11-24 [id number]  Called floor to ask RN if we could come an apply UNNA  BOOTS no answer, will try again later   Patient ID: Elaine [name], female   DOB: 30-Jun-1941, 81 y.o.   MRN: [id number]  Elaine [name] 06/06/2022, 11:38 AM

## 2022-06-06 NOTE — Progress Notes (Signed)
PHARMACY CONSULT NOTE FOR:  OUTPATIENT  PARENTERAL ANTIBIOTIC THERAPY (OPAT)  Indication: Enterococcus faecalis TV IE  Regimen: ampicillin 2g IV Q8H AND Ceftriaxone 2g IV Q12H  End date: 07/16/2022   IV antibiotic discharge orders are pended. To discharging provider:  please sign these orders via discharge navigator,  Select New Orders & click on the button choice - Manage This Unsigned Work.     Thank you for allowing pharmacy to be a part of this patient's care.  Adria Dill, PharmD PGY-2 Infectious Diseases Resident  06/06/2022 9:33 AM

## 2022-06-06 NOTE — Progress Notes (Signed)
Patient ID: Elaine [name], female   DOB: October 19, 1940, 81 y.o.   MRN: [id number]     Advanced Heart Failure Rounding Note  PCP-Cardiologist: Glenetta Hew, MD   Subjective:    Main complaint this morning is mid-back pain.  Denies dyspnea at rest.   TEE: Mildly dilated LV with EF 30%, mild RV enlargement with mild RV dysfunction, mod-severe TR with < 1 cm tricuspid valve vegetation.    MRI spine: Discitis/osteomyelitis at T8-T9 with small fluid collection.    Objective:   Weight Range: 102.7 kg Body mass index is 41.41 kg/m.   Vital Signs:   Temp:  [96.4 F (35.8 C)-98.1 F (36.7 C)] 96.4 F (35.8 C) (10/10 0815) Pulse Rate:  [73-78] 75 (10/10 0711) Resp:  [15-20] 20 (10/10 0711) BP: (117-139)/(51-66) 139/66 (10/10 0711) SpO2:  [95 %-100 %] 97 % (10/10 0711) Weight:  [102.7 kg] 102.7 kg (10/10 0711) Last BM Date : 06/01/22  Weight change: Filed Weights   06/05/22 0524 06/06/22 0521 06/06/22 0711  Weight: 103.5 kg 102.7 kg 102.7 kg    Intake/Output:   Intake/Output Summary (Last 24 hours) at 06/06/2022 0816 Last data filed at 06/06/2022 0642 Gross per 24 hour  Intake 174.91 ml  Output 1100 ml  Net -925.09 ml      Physical Exam    General:  Well appearing. No resp difficulty HEENT: Normal Neck: Supple. JVP 12 cm. Carotids 2+ bilat; no bruits. No lymphadenopathy or thyromegaly appreciated. Cor: PMI nondisplaced. Regular rate & rhythm. No rubs, gallops. 2/6 HSM LLSB.  Lungs: Clear Abdomen: Soft, nontender, nondistended. No hepatosplenomegaly. No bruits or masses. Good bowel sounds. Extremities: No cyanosis, clubbing, rash. 1+ ankle edema.  Neuro: Alert & orientedx3, cranial nerves grossly intact. moves all 4 extremities w/o difficulty. Affect pleasant   Telemetry   Atrial fibrillation with BiV pacing  Labs    CBC Recent Labs    06/05/22 0242 06/06/22 0056  WBC 9.3 7.7  HGB 8.4* 9.7*  HCT 25.1* 28.5*  MCV 86.0 84.1  PLT 206 433   Basic Metabolic  Panel Recent Labs    06/04/22 0705 06/05/22 0242 06/06/22 0056  NA 136 134* 129*  K 4.0 4.1 3.5  CL 96* 99 94*  CO2 27 24 21*  GLUCOSE 174* 156* 141*  BUN 50* 62* 65*  CREATININE 1.91* 2.25* 2.30*  CALCIUM 9.6 8.5* 8.8*  MG 2.0  --   --    Liver Function Tests Recent Labs    06/04/22 0705 06/05/22 0242  AST 38 28  ALT 27 26  ALKPHOS 109 97  BILITOT 1.5* 1.1  PROT 6.4* 6.1*  ALBUMIN 2.2* 1.9*   No results for input(s): "LIPASE", "AMYLASE" in the last 72 hours. Cardiac Enzymes No results for input(s): "CKTOTAL", "CKMB", "CKMBINDEX", "TROPONINI" in the last 72 hours.  BNP: BNP (last 3 results) Recent Labs    10/26/21 1056 11/29/21 1057 06/03/22 0228  BNP 479.3* 396.3* 1,191.1*    ProBNP (last 3 results) No results for input(s): "PROBNP" in the last 8760 hours.   D-Dimer No results for input(s): "DDIMER" in the last 72 hours. Hemoglobin A1C No results for input(s): "HGBA1C" in the last 72 hours. Fasting Lipid Panel No results for input(s): "CHOL", "HDL", "LDLCALC", "TRIG", "CHOLHDL", "LDLDIRECT" in the last 72 hours. Thyroid Function Tests No results for input(s): "TSH", "T4TOTAL", "T3FREE", "THYROIDAB" in the last 72 hours.  Invalid input(s): "FREET3"  Other results:   Imaging    MR THORACIC SPINE WO CONTRAST  Result Date: 06/05/2022 CLINICAL DATA:  Back pain.  Concern for osteomyelitis. EXAM: MRI THORACIC SPINE WITHOUT CONTRAST TECHNIQUE: Multiplanar, multisequence MR imaging of the thoracic spine was performed. No intravenous contrast was administered. COMPARISON:  None Available. FINDINGS: Alignment: Grade 1 anterolisthesis of C7 on T1, T2 on T3. Mild retrolisthesis of T12 on L1. Vertebrae: There is T2/FLAIR hyperintense signal abnormality within the inferior and superior aspects of the T8 and T9 vertebral body levels (series 20, image 11), as well as the T8-T9 disc space. There is likely a small fluid collection along the left lateral aspect of the  T8-T9 vertebral body level measuring approximately 1.9 x 1.3 cm (series 19, images 25-28). There is no definite evidence of epidural abscess at this level, but assessment is markedly limited due to the lack of IV contrast and motion artifact. Cord: Assessment of the spinal cord is markedly limited due to the degree of motion artifact. Paraspinal and other soft tissues: Small bilateral pleural effusions. See above for description of paraspinal fluid collections at the T8-T9 level on the left. Disc levels: There are multilevel disc bulges without definite evidence of high-grade spinal canal stenosis. There is likely at least moderate neural foraminal stenosis at T8-T9 on the left. IMPRESSION: 1. Markedly limited exam due to the degree of motion artifact and lack of IV contrast. 2. Within this limitation, there are findings that are worrisome for discitis/osteomyelitis at T8-T9 with a small fluid collection along the left lateral aspect of the T8-T9 vertebral body level measuring approximately 1.9 x 1.3 cm. No definite evidence of epidural abscess, but assessment is markedly limited due to the degree of motion artifact. Recommend further evaluation with a contrast enhanced exam if the patient is able to limit movement at time of imaging. 3. Small bilateral pleural effusions Electronically Signed   By: Marin Roberts M.D.   On: 06/05/2022 16:00     Medications:     Scheduled Medications:  [MAR Hold] apixaban  2.5 mg Oral BID   furosemide  80 mg Intravenous BID   [MAR Hold] insulin aspart  0-5 Units Subcutaneous QHS   [MAR Hold] insulin aspart  0-6 Units Subcutaneous TID WC   [MAR Hold] isosorbide-hydrALAZINE  0.5 tablet Oral TID   [MAR Hold] levothyroxine  112 mcg Oral Q0600   [MAR Hold] metoprolol succinate  50 mg Oral BID   potassium chloride  40 mEq Oral Once   [MAR Hold] rosuvastatin  5 mg Oral QODAY   [MAR Hold] sodium chloride flush  3 mL Intravenous Q12H   [MAR Hold] umeclidinium-vilanterol  1  puff Inhalation Daily    Infusions:  sodium chloride 20 mL/hr at 06/06/22 0745   [MAR Hold] ampicillin (OMNIPEN) IV 2 g (06/06/22 0626)    PRN Medications: [MAR Hold] acetaminophen **OR** [MAR Hold] acetaminophen, [MAR Hold] ondansetron **OR** [MAR Hold] ondansetron (ZOFRAN) IV, [MAR Hold] senna-docusate    Assessment/Plan   1. Acute on chronic systolic CHF: Ischemic cardiomyopathy.  St Jude CRT-D device, now s/p AV nodal ablation.  Echo in 3/21 showed EF 20% with diffuse hypokinesis, mildly dilated RV with mildly decreased systolic function, PASP 49 mmHg, dilated IVC. Echo 12/22 EF 20-25%, mildly decreased RV function.  Echo 10/23  EF 25 to 30%, global hypokinesis, mild dyskinesis of the left ventricular entire apical segment. Moderate reduction in RV systolic function, RVSP 23.5 mmHg, moderate to severe TR.  Presented NYHA III-IIIb. Limited at baseline d/t peripheral neuropathy.  On exam, she still looks volume overloaded.  Creatinine  mildly higher today, 2.25 => 2.3.  - Give 1 more day of aggressive diuresis, Lasix 80 mg IV bid x 2 doses today then back to po diuretic.  - Unable to tolerate Bidil 1 tab tid, continue Bidil 1/2 tab tid  - On Toprol XL 100 qam/50 qpm at home, '50mg'$  BID here - Off empagliflozin due to yeast infection and UTI.  - Off spironolactone with side effects.  - Place UNNA boots - Strict I&O, daily weights 2. CAD: S/p CABG in 2015. No chest pain.   - No ASA given apixaban use.  - Continue Crestor 5 daily + Zetia, good lipids 12/22 3. Atrial fibrillation: Chronic now, looks like it has gone on since 2018.  Unable to cardiovert her on amiodarone so it was stopped.  At this point Afib chronic. She is now s/p AVN ablation to allow more BiV pacing. BiV-paced. - Continue apixaban.   4. CKD: Stage 4: SCr baseline 2.1-2.5. 2.25 => 2.3 today.  Would like 1 more day aggressive diuresis, watch creatinine closely.  5. UTI/back pain/bacteremia: Patient has had E faecalis in  blood and urine, source likely UTI.  MRI back shows T8-9 discitis and osteomyelitis with fluid collection.  TEE today showed a small (<1 cm) vegetation on the tricuspid valve, does not appear to involve device leads though in close proximity.  - Continue ampicillin IV - Management per ID, will make EP aware of endocarditis given device presence.   6. DM  - SSI per primary   Length of Stay: 3  Loralie Champagne, MD  06/06/2022, 8:16 AM  Advanced Heart Failure Team Pager [phone] (M-F; 7a - 5p)  Please contact [facility] for night-coverage after hours (5p -7a ) and weekends on [email or web address]

## 2022-06-06 NOTE — CV Procedure (Addendum)
Procedure: TEE  Indication: Enterococcus bacteremia  Sedation: Per anesthesiology  Findings: Please see echo section for full report.  Mildly dilated left ventricle with normal wall thickness.  Diffuse hypokinesis, EF 30%.  Mildly dilated RV with mildly decreased systolic function.  Moderate left atrial enlargement, the LA appendage has been surgically ligated.  Mildly dilated right atrium.  No ASD/PFO by color doppler. Pacer wires in RV do not show vegetation.  Moderate-severe TR, there was a small, < 1 cm, vegetation on the tricuspid valve. The posterior mitral leaflet was calcified and restricted, mild mitral regurgitation and no stenosis.  No vegetation on the MV.  Trileaflet aortic valve.  Lambl's excrescence was present but no vegetation. No regurgitation or stenosis.  Normal caliber thoracic aorta with grade 3 plaque. 2.5 cm IVC.   Impression: Tricuspid valve endocarditis.   Elaine [name] 06/06/2022 8:13 AM

## 2022-06-06 NOTE — Anesthesia Postprocedure Evaluation (Signed)
Anesthesia Post Note  Patient: Elaine [name]  Procedure(s) Performed: TRANSESOPHAGEAL ECHOCARDIOGRAM (TEE)     Patient location during evaluation: Endoscopy Anesthesia Type: MAC Level of consciousness: awake and alert Pain management: pain level controlled Vital Signs Assessment: post-procedure vital signs reviewed and stable Respiratory status: spontaneous breathing, nonlabored ventilation, respiratory function stable and patient connected to nasal cannula oxygen Cardiovascular status: stable and blood pressure returned to baseline Postop Assessment: no apparent nausea or vomiting Anesthetic complications: no   No notable events documented.  Last Vitals:  Vitals:   06/06/22 0921 06/06/22 1225  BP: (!) 128/56 (!) 123/53  Pulse: 72 73  Resp: 20 19  Temp: (!) 36.4 C   SpO2: 100% 94%    Last Pain:  Vitals:   06/06/22 0921  TempSrc: Oral  PainSc:                  [place]

## 2022-06-06 NOTE — Anesthesia Preprocedure Evaluation (Signed)
Anesthesia Evaluation  Patient identified by MRN, date of birth, ID band Patient awake    Reviewed: Allergy & Precautions, H&P , NPO status , Patient's Chart, lab work & pertinent test results  Airway Mallampati: II   Neck ROM: full    Dental   Pulmonary COPD, former smoker,    breath sounds clear to auscultation       Cardiovascular hypertension, + CAD, + Past MI, + CABG, +CHF and + DOE  + Cardiac Defibrillator  Rhythm:regular Rate:Normal     Neuro/Psych  Neuromuscular disease    GI/Hepatic   Endo/Other  diabetes, Type 2Hypothyroidism   Renal/GU Renal InsufficiencyRenal disease     Musculoskeletal  (+) Arthritis ,   Abdominal   Peds  Hematology  (+) Blood dyscrasia, anemia ,   Anesthesia Other Findings   Reproductive/Obstetrics                             Anesthesia Physical Anesthesia Plan  ASA: 3  Anesthesia Plan: MAC   Post-op Pain Management:    Induction: Intravenous  PONV Risk Score and Plan: 2 and Propofol infusion and Treatment may vary due to age or medical condition  Airway Management Planned: Nasal Cannula  Additional Equipment:   Intra-op Plan:   Post-operative Plan:   Informed Consent: I have reviewed the patients History and Physical, chart, labs and discussed the procedure including the risks, benefits and alternatives for the proposed anesthesia with the patient or authorized representative who has indicated his/her understanding and acceptance.     Dental advisory given  Plan Discussed with: Anesthesiologist, Surgeon and CRNA  Anesthesia Plan Comments:         Anesthesia Quick Evaluation

## 2022-06-06 NOTE — Interval H&P Note (Signed)
History and Physical Interval Note:  06/06/2022 7:45 AM  Elaine [name]  has presented today for surgery, with the diagnosis of BACTERIMIA.  The various methods of treatment have been discussed with the patient and family. After consideration of risks, benefits and other options for treatment, the patient has consented to  Procedure(s): TRANSESOPHAGEAL ECHOCARDIOGRAM (TEE) (N/A) as a surgical intervention.  The patient's history has been reviewed, patient examined, no change in status, stable for surgery.  I have reviewed the patient's chart and labs.  Questions were answered to the patient's satisfaction.     Noelle Hoogland [employer]

## 2022-06-06 NOTE — TOC Progression Note (Signed)
Transition of Care [facility]) - Progression Note    Patient Details  Name: Elaine [name] MRN: [id number] Date of Birth: 1941/05/07  Transition of Care [facility]) CM/SW [place], [place] Phone Number: 06/06/2022, 3:57 PM  Clinical Narrative:    CSW provided bed offers to pt, pt chose [facility]. Facility notified, CSW to start authorization. TOC will continue to follow for DC needs.   Expected Discharge Plan: [place] Barriers to Discharge: Continued Medical Work up  Expected Discharge Plan and Services Expected Discharge Plan: [place] Choice: [place] arrangements for the past 2 months: Single Family Home                                       Social Determinants of Health (SDOH) Interventions    Readmission Risk Interventions     No data to display

## 2022-06-07 ENCOUNTER — Ambulatory Visit (INDEPENDENT_AMBULATORY_CARE_PROVIDER_SITE_OTHER): Payer: Medicare Other

## 2022-06-07 DIAGNOSIS — I255 Ischemic cardiomyopathy: Secondary | ICD-10-CM | POA: Diagnosis not present

## 2022-06-07 DIAGNOSIS — Z87891 Personal history of nicotine dependence: Secondary | ICD-10-CM

## 2022-06-07 DIAGNOSIS — I5023 Acute on chronic systolic (congestive) heart failure: Secondary | ICD-10-CM | POA: Diagnosis not present

## 2022-06-07 LAB — CUP PACEART REMOTE DEVICE CHECK
Battery Remaining Longevity: 61 mo
Battery Remaining Percentage: 63 %
Battery Voltage: 2.98 V
Date Time Interrogation Session: 20231011020256
HighPow Impedance: 57 Ohm
Implantable Lead Implant Date: 20201130
Implantable Lead Implant Date: 20201130
Implantable Lead Location: 753858
Implantable Lead Location: 753860
Implantable Pulse Generator Implant Date: 20201130
Lead Channel Impedance Value: 440 Ohm
Lead Channel Impedance Value: 460 Ohm
Lead Channel Pacing Threshold Amplitude: 0.5 V
Lead Channel Pacing Threshold Amplitude: 0.75 V
Lead Channel Pacing Threshold Pulse Width: 0.4 ms
Lead Channel Pacing Threshold Pulse Width: 0.4 ms
Lead Channel Sensing Intrinsic Amplitude: 12 mV
Lead Channel Setting Pacing Amplitude: 1.25 V
Lead Channel Setting Pacing Amplitude: 2.5 V
Lead Channel Setting Pacing Pulse Width: 0.4 ms
Lead Channel Setting Pacing Pulse Width: 0.4 ms
Lead Channel Setting Sensing Sensitivity: 0.5 mV
Pulse Gen Serial Number: 810000193

## 2022-06-07 LAB — BASIC METABOLIC PANEL
Anion gap: 13 (ref 5–15)
BUN: 61 mg/dL — ABNORMAL HIGH (ref 8–23)
CO2: 24 mmol/L (ref 22–32)
Calcium: 9.1 mg/dL (ref 8.9–10.3)
Chloride: 98 mmol/L (ref 98–111)
Creatinine, Ser: 2.21 mg/dL — ABNORMAL HIGH (ref 0.44–1.00)
GFR, Estimated: 22 mL/min — ABNORMAL LOW (ref >60)
Glucose, Bld: 139 mg/dL — ABNORMAL HIGH (ref 70–99)
Potassium: 3.8 mmol/L (ref 3.5–5.1)
Sodium: 135 mmol/L (ref 135–145)

## 2022-06-07 LAB — GLUCOSE, CAPILLARY
Glucose-Capillary: 140 mg/dL — ABNORMAL HIGH (ref 70–99)
Glucose-Capillary: 224 mg/dL — ABNORMAL HIGH (ref 70–99)
Glucose-Capillary: 157 mg/dL — ABNORMAL HIGH (ref 70–99)

## 2022-06-07 LAB — MAGNESIUM: Magnesium: 2 mg/dL (ref 1.7–2.4)

## 2022-06-07 MED ORDER — POTASSIUM CHLORIDE CRYS ER 20 MEQ PO TBCR
20.0000 meq | EXTENDED_RELEASE_TABLET | Freq: Once | ORAL | Status: AC
  Administered 2022-06-07: 20 meq via ORAL
  Filled 2022-06-07: qty 1

## 2022-06-07 MED ORDER — FUROSEMIDE 10 MG/ML IJ SOLN
80.0000 mg | Freq: Two times a day (BID) | INTRAMUSCULAR | Status: AC
  Administered 2022-06-07 (×2): 80 mg via INTRAVENOUS
  Filled 2022-06-07 (×2): qty 8

## 2022-06-07 MED ORDER — POTASSIUM CHLORIDE CRYS ER 20 MEQ PO TBCR
40.0000 meq | EXTENDED_RELEASE_TABLET | Freq: Once | ORAL | Status: AC
  Administered 2022-06-07: 40 meq via ORAL
  Filled 2022-06-07: qty 2

## 2022-06-07 MED ORDER — MELATONIN 5 MG PO TABS: 10.0000 mg | ORAL_TABLET | Freq: Every evening | ORAL | Status: DC | PRN

## 2022-06-07 MED ORDER — CAMPHOR-MENTHOL 0.5-0.5 % EX LOTN
TOPICAL_LOTION | CUTANEOUS | Status: DC | PRN
  Filled 2022-06-07: qty 222

## 2022-06-07 MED ORDER — DIPHENHYDRAMINE-ZINC ACETATE 2-0.1 % EX CREA
TOPICAL_CREAM | Freq: Three times a day (TID) | CUTANEOUS | Status: DC | PRN
  Filled 2022-06-07: qty 28

## 2022-06-07 NOTE — Progress Notes (Signed)
Physical Therapy Treatment Patient Details Name: Elaine [name] MRN: [id number] DOB: [birth year] Today's Date: 06/07/2022   History of Present Illness Pt is an 81 y/o F admitted on 06/02/22 with a working diagnosis of decompensated heart failure. Pt is also being treated for a UTI, positive diverticulosis. PMH: CAD, a-fib, COPD, chronic hypoxemic respiratory failure & systolic heart failure    PT Comments    Pt remains limited by back pain and fatigue, tolerating short bouts of mobility separated by rest breaks. Pt continues to require assistance to perform bed mobility and to stand, and is at a high risk for falls due to strength and endurance deficits. PT continues to recommend SNF placement at this time.   Recommendations for follow up therapy are one component of a multi-disciplinary discharge planning process, led by the attending physician.  Recommendations may be updated based on patient status, additional functional criteria and insurance authorization.  Follow Up Recommendations  Skilled nursing-short term rehab (<3 hours/day) Can patient physically be transported by private vehicle: No   Assistance Recommended at Discharge Frequent or constant Supervision/Assistance  Patient can return home with the following A lot of help with walking and/or transfers;A lot of help with bathing/dressing/bathroom;Assistance with cooking/housework;Assist for transportation;Help with stairs or ramp for entrance   Equipment Recommendations  None recommended by PT    Recommendations for Other Services       Precautions / Restrictions Precautions Precautions: Fall Restrictions Weight Bearing Restrictions: No     Mobility  Bed Mobility Overal bed mobility: Needs Assistance Bed Mobility: Supine to Sit     Supine to sit: Mod assist, HOB elevated     General bed mobility comments: use of rails, PT assist to scoot hips toward edge of bed    Transfers Overall transfer level: Needs  assistance Equipment used: Rolling walker (2 wheels) Transfers: Sit to/from Stand Sit to Stand: Mod assist, From elevated surface   Step pivot transfers: Min assist       General transfer comment: verbal cues for increased trunk flexion and hand placement    Ambulation/Gait Ambulation/Gait assistance: Min assist Gait Distance (Feet): 3 Feet Assistive device: Rolling walker (2 wheels) Gait Pattern/deviations: Step-to pattern, Wide base of support, Trunk flexed Gait velocity: reduced Gait velocity interpretation: <1.31 ft/sec, indicative of household ambulator   General Gait Details: pt leaning on middle bar of RW, hunched over   Stairs             Wheelchair Mobility    Modified Rankin (Stroke Patients Only)       Balance Overall balance assessment: Needs assistance Sitting-balance support: No upper extremity supported, Feet supported Sitting balance-Leahy Scale: Fair     Standing balance support: Bilateral upper extremity supported, Reliant on assistive device for balance Standing balance-Leahy Scale: Poor                              Cognition Arousal/Alertness: Awake/alert Behavior During Therapy: WFL for tasks assessed/performed Overall Cognitive Status: Within Functional Limits for tasks assessed                                          Exercises      General Comments General comments (skin integrity, edema, etc.): VSS on RA      Pertinent Vitals/Pain Pain Assessment Pain Assessment: Faces Faces Pain Scale: Hurts  even more Pain Location: low back Pain Descriptors / Indicators: Aching Pain Intervention(s): Monitored during session    Home Living                          Prior Function            PT Goals (current goals can now be found in the care plan section) Acute Rehab PT Goals Patient Stated Goal: get better Progress towards PT goals: Progressing toward goals    Frequency    Min  3X/week      PT Plan Current plan remains appropriate    Co-evaluation              AM-PAC PT "6 Clicks" Mobility   Outcome Measure  Help needed turning from your back to your side while in a flat bed without using bedrails?: A Little Help needed moving from lying on your back to sitting on the side of a flat bed without using bedrails?: A Lot Help needed moving to and from a bed to a chair (including a wheelchair)?: A Lot Help needed standing up from a chair using your arms (e.g., wheelchair or bedside chair)?: A Lot Help needed to walk in hospital room?: Total Help needed climbing 3-5 steps with a railing? : Total 6 Click Score: 11    End of Session   Activity Tolerance: Patient limited by fatigue Patient left: in chair;with call bell/phone within reach Nurse Communication: Mobility status PT Visit Diagnosis: Muscle weakness (generalized) (M62.81);Unsteadiness on feet (R26.81);Pain Pain - part of body:  (low back)     Time: 7681-1572 PT Time Calculation (min) (ACUTE ONLY): 28 min  Charges:  $Therapeutic Activity: 23-37 mins                     Zenaida Niece, PT, DPT Acute Rehabilitation Office [phone]    Zenaida Niece 06/07/2022, 11:27 AM

## 2022-06-07 NOTE — Progress Notes (Signed)
PROGRESS NOTE    Elaine [name]  NID:[id number] DOB: Apr 10, 1941 DOA: 06/02/2022 PCP: Ginger Organ., MD  81/F with history of chronic systolic CHF, chronic hypoxic respiratory failure on home O2, COPD, paroxysmal A-fib, CAD presented to the ED with worsening dyspnea, edema, dysuria and back pain. Chest x-ray noted small left pleural effusion, CT abdomen pelvis with inflammation at the urinary bladder. -Admitted, started on diuretics -She was then noted to have positive blood cultures with Enterococcus -Infectious disease was consulted, subsequently underwent a TEE which noted tricuspid valve vegetation consistent with endocarditis -MRI positive for discitis, osteomyelitis at T8, T9, no epidural abscess   Subjective: -feels better overall, breathing and back pain is improving  Assessment and Plan:  Acute on chronic systolic CHF  -Echo EF 25 to 30%, Moderate reduction in RV systolic function, Moderate to severe TR  -Known ischemic cardiomyopathy -Heart failure team following -Diuresed with IV Lasix, now p.o. torsemide -Also remains on BiDil, Toprol -Intolerant to SGLT2 and Aldactone -BMP in a.m. -PT OT consulted, SNF recommended, TOC following  Endocarditis of tricuspid valve T8, T9 discitis/osteomyelitis Enterococcus faecalis bacteremia and UTI -TEE positive for small vegetation on tricuspid valve, did not appear to involve device leads, cards to make EP aware -Appreciate infectious disease input -Remains on IV ampicillin and ceftriaxone -Repeat blood cultures from 10/8 negative X 2 days  Permanent atrial fibrillation - CHA2DS2-VASc Score 6, on Eliquis -Continue Toprol and apixaban, off amiodarone  AKI on CKD stage IV -Creatinine now stable at baseline of 2.1-2.5 -Stable monitor on diuretics  CAD status post CABG Patient with no chest pain.   Essential hypertension -Stable continue Toprol and BiDil  Type 2 diabetes mellitus with hyperlipidemia (HCC) -Stable  continue SSI  Chronic respiratory failure with hypoxia (HCC) COPD -Stable, continue current inhalers  Hypothyroidism Continue with levothyroxine   Class 3 obesity (HCC) Calculated BMI 40,2   DVT prophylaxis: Eliquis Code Status: Full Family Communication: Discussed with patient detail no family at bedside Disposition Plan: TOC following, plan for SNF  Consultants:    Procedures:   Antimicrobials:    Objective: Vitals:   06/06/22 2011 06/07/22 0452 06/07/22 0738 06/07/22 0818  BP: 120/73 (!) 122/50  (!) 113/57  Pulse: 72 73  73  Resp: 18 18    Temp: 98 F (36.7 C) 98.5 F (36.9 C)  97.8 F (36.6 C)  TempSrc: Oral Oral  Oral  SpO2: 93% 97% 97% 94%  Weight:  101.7 kg    Height:        Intake/Output Summary (Last 24 hours) at 06/07/2022 0941 Last data filed at 06/07/2022 0500 Gross per 24 hour  Intake --  Output 1100 ml  Net -1100 ml   Filed Weights   06/06/22 0521 06/06/22 0711 06/07/22 0452  Weight: 102.7 kg 102.7 kg 101.7 kg    Examination:  General exam: Pleasant elderly chronically ill female laying in bed, AAOx3 HEENT: Positive JVD CVS: S1-S2, regular rhythm, systolic murmur Lungs: Clear Abdomen: Soft, nontender, bowel sounds present Extremities: Trace edema Neuro: Moves all extremities, no localizing signs  Data Reviewed:   CBC: Recent Labs  Lab 06/02/22 1538 06/03/22 0243 06/03/22 0740 06/04/22 0705 06/05/22 0242 06/06/22 0056  WBC 14.0*  --  12.7* 10.4 9.3 7.7  HGB 9.9* 10.2* 9.3* 9.2* 8.4* 9.7*  HCT 29.1* 30.0* 28.6* 27.2* 25.1* 28.5*  MCV 87.9  --  89.1 85.5 86.0 84.1  PLT 293  --  243 216 206 144   Basic Metabolic  Panel: Recent Labs  Lab 06/03/22 0740 06/04/22 0705 06/05/22 0242 06/06/22 0056 06/07/22 0736  NA 138 136 134* 129* 135  K 4.4 4.0 4.1 3.5 3.8  CL 99 96* 99 94* 98  CO2 '26 27 24 '$ 21* 24  GLUCOSE 182* 174* 156* 141* 139*  BUN 51* 50* 62* 65* 61*  CREATININE 1.87* 1.91* 2.25* 2.30* 2.21*  CALCIUM 9.6 9.6  8.5* 8.8* 9.1  MG  --  2.0  --   --  2.0   GFR: Estimated Creatinine Clearance: 22.3 mL/min (A) (by C-G formula based on SCr of 2.21 mg/dL (H)). Liver Function Tests: Recent Labs  Lab 06/02/22 1538 06/03/22 0740 06/04/22 0705 06/05/22 0242  AST 36 31 38 28  ALT '28 27 27 26  '$ ALKPHOS 104 101 109 97  BILITOT 2.6* 2.0* 1.5* 1.1  PROT 7.4 6.9 6.4* 6.1*  ALBUMIN 2.6* 2.4* 2.2* 1.9*   Recent Labs  Lab 06/02/22 1538  LIPASE 69*   No results for input(s): "AMMONIA" in the last 168 hours. Coagulation Profile: No results for input(s): "INR", "PROTIME" in the last 168 hours. Cardiac Enzymes: No results for input(s): "CKTOTAL", "CKMB", "CKMBINDEX", "TROPONINI" in the last 168 hours. BNP (last 3 results) No results for input(s): "PROBNP" in the last 8760 hours. HbA1C: No results for input(s): "HGBA1C" in the last 72 hours. CBG: Recent Labs  Lab 06/06/22 0837 06/06/22 1116 06/06/22 1611 06/06/22 2118 06/07/22 0820  GLUCAP 118* 154* 154* 188* 140*   Lipid Profile: No results for input(s): "CHOL", "HDL", "LDLCALC", "TRIG", "CHOLHDL", "LDLDIRECT" in the last 72 hours. Thyroid Function Tests: No results for input(s): "TSH", "T4TOTAL", "FREET4", "T3FREE", "THYROIDAB" in the last 72 hours. Anemia Panel: No results for input(s): "VITAMINB12", "FOLATE", "FERRITIN", "TIBC", "IRON", "RETICCTPCT" in the last 72 hours. Urine analysis:    Component Value Date/Time   COLORURINE YELLOW 06/03/2022 0440   APPEARANCEUR TURBID (A) 06/03/2022 0440   LABSPEC 1.012 06/03/2022 0440   PHURINE 5.0 06/03/2022 0440   GLUCOSEU NEGATIVE 06/03/2022 0440   HGBUR LARGE (A) 06/03/2022 0440   BILIRUBINUR NEGATIVE 06/03/2022 0440   KETONESUR NEGATIVE 06/03/2022 0440   PROTEINUR 100 (A) 06/03/2022 0440   UROBILINOGEN 0.2 02/02/2014 1943   NITRITE NEGATIVE 06/03/2022 0440   LEUKOCYTESUR LARGE (A) 06/03/2022 0440   Sepsis Labs: '@LABRCNTIP'$ (procalcitonin:4,lacticidven:4)  ) Recent Results (from the  past 240 hour(s))  Urine Culture     Status: Abnormal   Collection Time: 06/03/22  4:54 AM   Specimen: Urine, Catheterized  Result Value Ref Range Status   Specimen Description URINE, CATHETERIZED  Final   Special Requests   Final    NONE Performed at [facility], [address], [place], [place] [address]    Culture 80,000 COLONIES/mL ENTEROCOCCUS FAECALIS (A)  Final   Report Status 06/05/2022 FINAL  Final   Organism ID, Bacteria ENTEROCOCCUS FAECALIS (A)  Final      Susceptibility   Enterococcus faecalis - MIC*    AMPICILLIN <=2 SENSITIVE Sensitive     NITROFURANTOIN <=16 SENSITIVE Sensitive     VANCOMYCIN 1 SENSITIVE Sensitive     * 80,000 COLONIES/mL ENTEROCOCCUS FAECALIS  Blood culture (routine x 2)     Status: Abnormal   Collection Time: 06/03/22  5:11 AM   Specimen: BLOOD  Result Value Ref Range Status   Specimen Description BLOOD RIGHT ANTECUBITAL  Final   Special Requests   Final    BOTTLES DRAWN AEROBIC AND ANAEROBIC Blood Culture adequate volume   Culture  Setup Time   Final    GRAM POSITIVE COCCI IN CHAINS IN BOTH AEROBIC AND ANAEROBIC BOTTLES CRITICAL RESULT CALLED TO, READ BACK BY AND VERIFIED WITH: Pamala Hurry [id number] AT 2013 BY EC Performed at [facility], [place] [address] [place], [place] [address]    Culture ENTEROCOCCUS FAECALIS (A)  Final   Report Status 06/05/2022 FINAL  Final   Organism ID, Bacteria ENTEROCOCCUS FAECALIS  Final      Susceptibility   Enterococcus faecalis - MIC*    AMPICILLIN <=2 SENSITIVE Sensitive     VANCOMYCIN 1 SENSITIVE Sensitive     GENTAMICIN SYNERGY SENSITIVE Sensitive     * ENTEROCOCCUS FAECALIS  Blood Culture ID Panel (Reflexed)     Status: Abnormal   Collection Time: 06/03/22  5:11 AM  Result Value Ref Range Status   Enterococcus faecalis DETECTED (A) NOT DETECTED Final    Comment: CRITICAL RESULT CALLED TO, READ BACK BY AND VERIFIED WITH: C/  PHARMD LYDIA CHEN [id number] AT 2013 BY EC CRITICAL COMMENT  ADDED BY A. LAFRANCE 06/03/22 2132     Enterococcus Faecium NOT DETECTED NOT DETECTED Final   Listeria monocytogenes NOT DETECTED NOT DETECTED Final   Staphylococcus species NOT DETECTED NOT DETECTED Final   Staphylococcus aureus (BCID) NOT DETECTED NOT DETECTED Final   Staphylococcus epidermidis NOT DETECTED NOT DETECTED Final   Staphylococcus lugdunensis NOT DETECTED NOT DETECTED Final   Streptococcus species NOT DETECTED NOT DETECTED Final   Streptococcus agalactiae NOT DETECTED NOT DETECTED Final   Streptococcus pneumoniae NOT DETECTED NOT DETECTED Final   Streptococcus pyogenes NOT DETECTED NOT DETECTED Final   A.calcoaceticus-baumannii NOT DETECTED NOT DETECTED Final   Bacteroides fragilis NOT DETECTED NOT DETECTED Final   Enterobacterales NOT DETECTED NOT DETECTED Final   Enterobacter cloacae complex NOT DETECTED NOT DETECTED Final   Escherichia coli NOT DETECTED NOT DETECTED Final   Klebsiella aerogenes NOT DETECTED NOT DETECTED Final   Klebsiella oxytoca NOT DETECTED NOT DETECTED Final   Klebsiella pneumoniae NOT DETECTED NOT DETECTED Final   Proteus species NOT DETECTED NOT DETECTED Final   Salmonella species NOT DETECTED NOT DETECTED Final   Serratia marcescens NOT DETECTED NOT DETECTED Final   Haemophilus influenzae NOT DETECTED NOT DETECTED Final   Neisseria meningitidis NOT DETECTED NOT DETECTED Final   Pseudomonas aeruginosa NOT DETECTED NOT DETECTED Final   Stenotrophomonas maltophilia NOT DETECTED NOT DETECTED Final   Candida albicans NOT DETECTED NOT DETECTED Final   Candida auris NOT DETECTED NOT DETECTED Final   Candida glabrata NOT DETECTED NOT DETECTED Final   Candida krusei NOT DETECTED NOT DETECTED Final   Candida parapsilosis NOT DETECTED NOT DETECTED Final   Candida tropicalis NOT DETECTED NOT DETECTED Final   Cryptococcus neoformans/gattii NOT DETECTED NOT DETECTED Final   Vancomycin resistance NOT DETECTED NOT DETECTED Final    Comment: Performed at  [facility], [address] [place], [place] [address]  Blood culture (routine x 2)     Status: Abnormal   Collection Time: 06/03/22  5:15 AM   Specimen: BLOOD  Result Value Ref Range Status   Specimen Description BLOOD SITE NOT SPECIFIED  Final   Special Requests   Final    BOTTLES DRAWN AEROBIC AND ANAEROBIC Blood Culture adequate volume   Culture  Setup Time   Final    GRAM POSITIVE COCCI IN CHAINS IN BOTH AEROBIC AND ANAEROBIC BOTTLES CRITICAL VALUE NOTED.  VALUE IS CONSISTENT WITH PREVIOUSLY REPORTED AND CALLED VALUE.  Culture (A)  Final    ENTEROCOCCUS FAECALIS SUSCEPTIBILITIES PERFORMED ON PREVIOUS CULTURE WITHIN THE LAST 5 DAYS. Performed at [facility], [place] [address] [place], [place] [address]    Report Status 06/05/2022 FINAL  Final  Culture, blood (Routine X 2) w Reflex to ID Panel     Status: None (Preliminary result)   Collection Time: 06/04/22  7:05 AM   Specimen: BLOOD  Result Value Ref Range Status   Specimen Description BLOOD VENOUS  Final   Special Requests   Final    BOTTLES DRAWN AEROBIC AND ANAEROBIC Blood Culture results may not be optimal due to an inadequate volume of blood received in culture bottles   Culture   Final    NO GROWTH 2 DAYS Performed at [facility], [place] [address], [place], [place] [address]    Report Status PENDING  Incomplete  Culture, blood (Routine X 2) w Reflex to ID Panel     Status: None (Preliminary result)   Collection Time: 06/04/22  7:06 AM   Specimen: BLOOD  Result Value Ref Range Status   Specimen Description BLOOD LEFT ANTECUBITAL  Final   Special Requests   Final    BOTTLES DRAWN AEROBIC AND ANAEROBIC Blood Culture results may not be optimal due to an inadequate volume of blood received in culture bottles   Culture   Final    NO GROWTH 2 DAYS Performed at [facility], [place] [address] [place], [place] [address]    Report Status PENDING  Incomplete     Radiology Studies: ECHO  TEE  Result Date: 06/30/2022    TRANSESOPHOGEAL ECHO REPORT   Patient Name:   Elaine [name] Date of Exam: June 30, 2022 Medical Rec #:  [id number]     Height:       62.0 in Accession #:    [id number]    Weight:       226.4 lb Date of Birth:  Feb 25, 1941     BSA:          2.015 m Patient Age:    39 years      BP:           97/61 mmHg Patient Gender: F             HR:           72 bpm. Exam Location:  Inpatient Procedure: 2D Echo, Transesophageal Echo, Cardiac Doppler and Color Doppler Indications:    Endocarditis  History:        Patient has prior history of Echocardiogram examinations, most                 recent 06/03/2022. Cardiomyopathy and CHF, CAD and Previous                 Myocardial Infarction, Prior CABG and Pacemaker, COPD, Mitral                 Valve Disease, Arrythmias:LBBB and Atrial Fibrillation,                 Signs/Symptoms:Murmur; Risk Factors:Diabetes, Hypertension,                 Dyslipidemia and Obesity.  Sonographer:    Eartha Inch Referring Phys: [place]: After discussion of the risks and benefits of a TEE, an informed consent was obtained from the patient. TEE procedure time was 11 minutes. The transesophogeal probe was passed without difficulty through the  esophogus of the patient. Imaged were obtained with the patient in a left lateral decubitus position. Sedation performed by different physician. The patient was monitored while under deep sedation. Anesthestetic sedation was provided intravenously by Anesthesiology: 194.'32mg'$  of Propofol, '60mg'$  of Lidocaine. Image quality was good. The patient's vital signs; including heart rate, blood pressure, and oxygen saturation; remained stable throughout the procedure. The patient developed no complications during the procedure. IMPRESSIONS  1. Left ventricular ejection fraction, by estimation, is 30%. The left ventricle has moderate to severely decreased function. The left ventricle demonstrates global hypokinesis. The left  ventricular internal cavity size was mildly dilated.  2. No vegetation noted on pacer wires in RA/RV. Right ventricular systolic function is mildly reduced. The right ventricular size is mildly enlarged. There is severely elevated pulmonary artery systolic pressure. The estimated right ventricular systolic  pressure is 07.3 mmHg.  3. The LA appendage has been surgically ligated. Left atrial size was moderately dilated. No left atrial/left atrial appendage thrombus was detected.  4. Right atrial size was mildly dilated.  5. The posterior mitral leaflet was calcified and restricted, mild mitral regurgitation and no stenosis. No vegetation on the MV.  6. Trileaflet aortic valve. Lambl's excrescence was present but no vegetation. No regurgitation or stenosis.  7. There was a small, < 1 cm, vegetation on the tricuspid valve.. The tricuspid valve is abnormal. Tricuspid valve regurgitation is moderate to severe.  8. The inferior vena cava is dilated in size with <50% respiratory variability, suggesting right atrial pressure of 15 mmHg.  9. No PFO or ASD by color doppler. 10. Normal caliber thoracic aorta with grade 3 plaque in descending thoracic aorta. FINDINGS  Left Ventricle: Left ventricular ejection fraction, by estimation, is 30%. The left ventricle has moderate to severely decreased function. The left ventricle demonstrates global hypokinesis. The left ventricular internal cavity size was mildly dilated. There is no left ventricular hypertrophy. Right Ventricle: No vegetation noted on pacer wires in RA/RV. The right ventricular size is mildly enlarged. No increase in right ventricular wall thickness. Right ventricular systolic function is mildly reduced. There is severely elevated pulmonary artery systolic pressure. The tricuspid regurgitant velocity is 3.37 m/s, and with an assumed right atrial pressure of 15 mmHg, the estimated right ventricular systolic pressure is 71.0 mmHg. Left Atrium: The LA appendage has  been surgically ligated. Left atrial size was moderately dilated. No left atrial/left atrial appendage thrombus was detected. Right Atrium: Right atrial size was mildly dilated. Pericardium: There is no evidence of pericardial effusion. Mitral Valve: The posterior mitral leaflet was calcified and restricted, mild mitral regurgitation and no stenosis. No vegetation on the MV. The mitral valve is abnormal. Mild mitral valve regurgitation. Tricuspid Valve: There was a small, < 1 cm, vegetation on the tricuspid valve. The tricuspid valve is abnormal. Tricuspid valve regurgitation is moderate to severe. Aortic Valve: Trileaflet aortic valve. Lambl's excrescence was present but no vegetation. No regurgitation or stenosis. The aortic valve is tricuspid. Aortic valve regurgitation is not visualized. Pulmonic Valve: The pulmonic valve was normal in structure. Pulmonic valve regurgitation is not visualized. Aorta: Normal caliber thoracic aorta with grade 3 plaque in descending thoracic aorta. The aortic root is normal in size and structure. Venous: The inferior vena cava is dilated in size with less than 50% respiratory variability, suggesting right atrial pressure of 15 mmHg. IAS/Shunts: No PFO or ASD by color doppler. Additional Comments: A device lead is visualized in the right ventricle.  IVC IVC diam: 2.50 cm  TRICUSPID VALVE TR Peak grad:   45.4 mmHg TR Vmax:        337.00 cm/s Dalton McleanMD Electronically signed by Franki Monte Signature Date/Time: 06/06/2022/4:13:37 PM    Final    MR THORACIC SPINE WO CONTRAST  Result Date: 06/05/2022 CLINICAL DATA:  Back pain.  Concern for osteomyelitis. EXAM: MRI THORACIC SPINE WITHOUT CONTRAST TECHNIQUE: Multiplanar, multisequence MR imaging of the thoracic spine was performed. No intravenous contrast was administered. COMPARISON:  None Available. FINDINGS: Alignment: Grade 1 anterolisthesis of C7 on T1, T2 on T3. Mild retrolisthesis of T12 on L1. Vertebrae: There is  T2/FLAIR hyperintense signal abnormality within the inferior and superior aspects of the T8 and T9 vertebral body levels (series 20, image 11), as well as the T8-T9 disc space. There is likely a small fluid collection along the left lateral aspect of the T8-T9 vertebral body level measuring approximately 1.9 x 1.3 cm (series 19, images 25-28). There is no definite evidence of epidural abscess at this level, but assessment is markedly limited due to the lack of IV contrast and motion artifact. Cord: Assessment of the spinal cord is markedly limited due to the degree of motion artifact. Paraspinal and other soft tissues: Small bilateral pleural effusions. See above for description of paraspinal fluid collections at the T8-T9 level on the left. Disc levels: There are multilevel disc bulges without definite evidence of high-grade spinal canal stenosis. There is likely at least moderate neural foraminal stenosis at T8-T9 on the left. IMPRESSION: 1. Markedly limited exam due to the degree of motion artifact and lack of IV contrast. 2. Within this limitation, there are findings that are worrisome for discitis/osteomyelitis at T8-T9 with a small fluid collection along the left lateral aspect of the T8-T9 vertebral body level measuring approximately 1.9 x 1.3 cm. No definite evidence of epidural abscess, but assessment is markedly limited due to the degree of motion artifact. Recommend further evaluation with a contrast enhanced exam if the patient is able to limit movement at time of imaging. 3. Small bilateral pleural effusions Electronically Signed   By: Marin Roberts M.D.   On: 06/05/2022 16:00     Scheduled Meds:  apixaban  2.5 mg Oral BID   Gerhardt's butt cream   Topical BID   insulin aspart  0-5 Units Subcutaneous QHS   insulin aspart  0-6 Units Subcutaneous TID WC   isosorbide-hydrALAZINE  0.5 tablet Oral TID   levothyroxine  112 mcg Oral Q0600   metoprolol succinate  50 mg Oral BID   rosuvastatin  5 mg  Oral QODAY   sodium chloride flush  3 mL Intravenous Q12H   umeclidinium-vilanterol  1 puff Inhalation Daily   Continuous Infusions:  ampicillin (OMNIPEN) IV 2 g (06/07/22 0516)   cefTRIAXone (ROCEPHIN)  IV 2 g (06/07/22 0844)     LOS: 4 days    Time spent:  61mn    PDomenic Polite MD [facility]   06/07/2022, 9:41 AM

## 2022-06-07 NOTE — Progress Notes (Signed)
Occupational Therapy Treatment Patient Details Name: Elaine [name] MRN: [id number] DOB: 05/30/1941 Today's Date: 06/07/2022   History of present illness Pt is an 81 y/o F admitted on 06/02/22 with a working diagnosis of decompensated heart failure. Pt is also being treated for a UTI, positive diverticulosis. PMH: CAD, a-fib, COPD, chronic hypoxemic respiratory failure & systolic heart failure   OT comments  Pt had been up in chair for hours upon OT's arrival. Stood with heavy mod assist for pressure relief and to reposition pt with hips back in chair. Marched in place with RW. Pt declined returning to bed. Pt with decreased attention during grooming resulting in poor thoroughness. Educated pt in use and availability of AE for LB ADLs. Pt unable to use reacher with recent carpal tunnel sx on R and IV in L hand. Pt completed UB bathing with moderate assistance. Continues to be appropriate for SNF level rehab.    Recommendations for follow up therapy are one component of a multi-disciplinary discharge planning process, led by the attending physician.  Recommendations may be updated based on patient status, additional functional criteria and insurance authorization.    Follow Up Recommendations  Skilled nursing-short term rehab (<3 hours/day)    Assistance Recommended at Discharge Frequent or constant Supervision/Assistance  Patient can return home with the following  A lot of help with walking and/or transfers;A lot of help with bathing/dressing/bathroom;Assistance with cooking/housework;Assist for transportation;Help with stairs or ramp for entrance   Equipment Recommendations  None recommended by OT    Recommendations for Other Services      Precautions / Restrictions Precautions Precautions: Fall Restrictions Weight Bearing Restrictions: No       Mobility Bed Mobility               General bed mobility comments: pt received in chair, declined return to bed     Transfers Overall transfer level: Needs assistance Equipment used: Rolling walker (2 wheels) Transfers: Sit to/from Stand Sit to Stand: Mod assist           General transfer comment: stood for pressure relief from chair with heavy reliance on UEs, use of momentum and heavy mod assist to rise     Balance Overall balance assessment: Needs assistance   Sitting balance-Leahy Scale: Fair     Standing balance support: Bilateral upper extremity supported, Reliant on assistive device for balance Standing balance-Leahy Scale: Poor                             ADL either performed or assessed with clinical judgement   ADL Overall ADL's : Needs assistance/impaired     Grooming: Minimal assistance;Sitting;Wash/dry hands;Wash/dry face Grooming Details (indicate cue type and reason): decreased thoroughness requiring physical assist to complete handwashing Upper Body Bathing: Moderate assistance;Sitting Upper Body Bathing Details (indicate cue type and reason): assist to wash, dry and apply lotion to itchy back           Lower Body Dressing Details (indicate cue type and reason): began educating pt in use of AE, pt unable to tolerate using reacher due to recent carpal tunnel sx on R and IV on dorsum of L hand                    Extremity/Trunk Assessment Upper Extremity Assessment Upper Extremity Assessment: RUE deficits/detail (carpal tunnel sx in Aug 2023)            Vision  Perception     Praxis      Cognition Arousal/Alertness: Awake/alert Behavior During Therapy: WFL for tasks assessed/performed Overall Cognitive Status: Impaired/Different from baseline Area of Impairment: Memory, Attention                   Current Attention Level: Sustained Memory: Decreased short-term memory         General Comments: pt with difficulty attending to education in LB ADLs with AE        Exercises      Shoulder Instructions        General Comments      Pertinent Vitals/ Pain       Pain Assessment Pain Assessment: Faces Faces Pain Scale: Hurts even more Pain Location: low back Pain Descriptors / Indicators: Jabbing, Spasm, Guarding, Grimacing Pain Intervention(s): Monitored during session, Premedicated before session, Repositioned  Home Living                                          Prior Functioning/Environment              Frequency  Min 2X/week        Progress Toward Goals  OT Goals(current goals can now be found in the care plan section)  Progress towards OT goals: Progressing toward goals  Acute Rehab OT Goals OT Goal Formulation: With patient Time For Goal Achievement: 06/19/22 Potential to Achieve Goals: Good  Plan Discharge plan remains appropriate    Co-evaluation                 AM-PAC OT "6 Clicks" Daily Activity     Outcome Measure   Help from another person eating meals?: A Little Help from another person taking care of personal grooming?: A Little Help from another person toileting, which includes using toliet, bedpan, or urinal?: A Lot Help from another person bathing (including washing, rinsing, drying)?: A Lot Help from another person to put on and taking off regular upper body clothing?: A Little Help from another person to put on and taking off regular lower body clothing?: Total 6 Click Score: 14    End of Session Equipment Utilized During Treatment: Rolling walker (2 wheels);Gait belt  OT Visit Diagnosis: Unsteadiness on feet (R26.81);Other abnormalities of gait and mobility (R26.89);Muscle weakness (generalized) (M62.81)   Activity Tolerance Patient tolerated treatment well   Patient Left in chair;with call bell/phone within reach   Nurse Communication          Time: 1660-6004 OT Time Calculation (min): 23 min  Charges: OT General Charges $OT Visit: 1 Visit OT Treatments $Self Care/Home Management : 23-37 mins  [name],  OTR/L Acute Rehabilitation Services Office: [phone]   [name] 06/07/2022, 2:48 PM

## 2022-06-07 NOTE — TOC Progression Note (Signed)
Transition of Care [facility]) - Progression Note    Patient Details  Name: Elaine [name] MRN: [id number] Date of Birth: 1940/09/08  Transition of Care [facility]) CM/SW [place], [place] Phone Number: 06/07/2022, 10:29 AM  Clinical Narrative:     CSW received insurance authorization for patient Plan Josem Kaufmann ID# [id number] [place] ID# [id number]. Insurance authorization has been approved from 10/11-10/13. Patient has SNF bed at [facility] CSW informed MD. CSW will continue to follow and assist with patients dc planning needs.  Expected Discharge Plan: [place] Barriers to Discharge: Continued Medical Work up  Expected Discharge Plan and Services Expected Discharge Plan: [place] Choice: [place] arrangements for the past 2 months: Single Family Home                                       Social Determinants of Health (SDOH) Interventions    Readmission Risk Interventions     No data to display

## 2022-06-07 NOTE — Plan of Care (Signed)
  Problem: Cardiac: Goal: Ability to achieve and maintain adequate cardiopulmonary perfusion will improve Outcome: Progressing   Problem: Fluid Volume: Goal: Ability to maintain a balanced intake and output will improve Outcome: Progressing   Problem: Health Behavior/Discharge Planning: Goal: Ability to identify and utilize available resources and services will improve Outcome: Progressing Goal: Ability to manage health-related needs will improve Outcome: Progressing   Problem: Skin Integrity: Goal: Risk for impaired skin integrity will decrease Outcome: Progressing   Problem: Nutritional: Goal: Maintenance of adequate nutrition will improve Outcome: Progressing Goal: Progress toward achieving an optimal weight will improve Outcome: Progressing   Problem: Pain Managment: Goal: General experience of comfort will improve Outcome: Progressing   Problem: Safety: Goal: Ability to remain free from injury will improve Outcome: Progressing   Problem: Elimination: Goal: Will not experience complications related to bowel motility Outcome: Progressing

## 2022-06-07 NOTE — Care Management Important Message (Signed)
Important Message  Patient Details  Name: Elaine [name] MRN: [id number] Date of Birth: 1941-06-04   Medicare Important Message Given:  Yes     Shelda Altes 06/07/2022, 11:54 AM

## 2022-06-07 NOTE — Progress Notes (Addendum)
Patient ID: Elaine [name], female   DOB: 1940-10-23, 81 y.o.   MRN: [id number]     Advanced Heart Failure Rounding Note  PCP-Cardiologist: Glenetta Hew, MD   Subjective:    TEE: Mildly dilated LV with EF 30%, mild RV enlargement with mild RV dysfunction, mod-severe TR with < 1 cm tricuspid valve vegetation.  EP made aware d/t close proximity to ICD  MRI spine: Discitis/osteomyelitis at T8-T9 with small fluid collection.    ID switched abx to IV ampicillin and ceftriaxone yesterday  In bed, calm no complaints. Denies CP and SOB.   Objective:   Weight Range: 101.7 kg Body mass index is 41.01 kg/m.   Vital Signs:   Temp:  [96.4 F (35.8 C)-98.5 F (36.9 C)] 98.5 F (36.9 C) (10/11 0452) Pulse Rate:  [72-73] 73 (10/11 0452) Resp:  [18-25] 18 (10/11 0452) BP: (97-128)/(48-73) 122/50 (10/11 0452) SpO2:  [93 %-100 %] 97 % (10/11 0738) Weight:  [101.7 kg] 101.7 kg (10/11 0452) Last BM Date : 06/01/22  Weight change: Filed Weights   06/06/22 0521 06/06/22 0711 06/07/22 0452  Weight: 102.7 kg 102.7 kg 101.7 kg    Intake/Output:   Intake/Output Summary (Last 24 hours) at 06/07/2022 0806 Last data filed at 06/07/2022 0500 Gross per 24 hour  Intake --  Output 1600 ml  Net -1600 ml      Physical Exam    General:  Well/elderly appearing. No resp difficulty HEENT: Normal Neck: Supple. JVP 9 cm. Carotids 2+ bilat; no bruits. No lymphadenopathy or thyromegaly appreciated. Cor: PMI nondisplaced. Regular rate & rhythm. No rubs, gallops. 2/6 HSM LLSB.  Lungs: Clear, diminished bases Abdomen: Soft, nontender, nondistended. No hepatosplenomegaly. No bruits or masses. Good bowel sounds. Extremities: No cyanosis, clubbing, rash. 1+ ankle edema.  Neuro: Alert & orientedx3, cranial nerves grossly intact. moves all 4 extremities w/o difficulty. Affect pleasant  Telemetry   Atrial fibrillation with BiV pacing 70s (Personally reviewed)    Labs    CBC Recent Labs     06/05/22 0242 06/06/22 0056  WBC 9.3 7.7  HGB 8.4* 9.7*  HCT 25.1* 28.5*  MCV 86.0 84.1  PLT 206 250   Basic Metabolic Panel Recent Labs    06/05/22 0242 06/06/22 0056  NA 134* 129*  K 4.1 3.5  CL 99 94*  CO2 24 21*  GLUCOSE 156* 141*  BUN 62* 65*  CREATININE 2.25* 2.30*  CALCIUM 8.5* 8.8*   Liver Function Tests Recent Labs    06/05/22 0242  AST 28  ALT 26  ALKPHOS 97  BILITOT 1.1  PROT 6.1*  ALBUMIN 1.9*   No results for input(s): "LIPASE", "AMYLASE" in the last 72 hours. Cardiac Enzymes No results for input(s): "CKTOTAL", "CKMB", "CKMBINDEX", "TROPONINI" in the last 72 hours.  BNP: BNP (last 3 results) Recent Labs    10/26/21 1056 11/29/21 1057 06/03/22 0228  BNP 479.3* 396.3* 1,191.1*    ProBNP (last 3 results) No results for input(s): "PROBNP" in the last 8760 hours.   D-Dimer No results for input(s): "DDIMER" in the last 72 hours. Hemoglobin A1C No results for input(s): "HGBA1C" in the last 72 hours. Fasting Lipid Panel No results for input(s): "CHOL", "HDL", "LDLCALC", "TRIG", "CHOLHDL", "LDLDIRECT" in the last 72 hours. Thyroid Function Tests No results for input(s): "TSH", "T4TOTAL", "T3FREE", "THYROIDAB" in the last 72 hours.  Invalid input(s): "FREET3"  Other results:   Imaging    ECHO TEE  Result Date: 06/06/2022    TRANSESOPHOGEAL ECHO REPORT  Patient Name:   Elaine [name] Date of Exam: 06/06/2022 Medical Rec #:  [id number]     Height:       62.0 in Accession #:    [id number]    Weight:       226.4 lb Date of Birth:  1941/05/26     BSA:          2.015 m Patient Age:    33 years      BP:           97/61 mmHg Patient Gender: F             HR:           72 bpm. Exam Location:  Inpatient Procedure: 2D Echo, Transesophageal Echo, Cardiac Doppler and Color Doppler Indications:    Endocarditis  History:        Patient has prior history of Echocardiogram examinations, most                 recent 06/03/2022. Cardiomyopathy and CHF, CAD and  Previous                 Myocardial Infarction, Prior CABG and Pacemaker, COPD, Mitral                 Valve Disease, Arrythmias:LBBB and Atrial Fibrillation,                 Signs/Symptoms:Murmur; Risk Factors:Diabetes, Hypertension,                 Dyslipidemia and Obesity.  Sonographer:    Eartha Inch Referring Phys: [place]: After discussion of the risks and benefits of a TEE, an informed consent was obtained from the patient. TEE procedure time was 11 minutes. The transesophogeal probe was passed without difficulty through the esophogus of the patient. Imaged were obtained with the patient in a left lateral decubitus position. Sedation performed by different physician. The patient was monitored while under deep sedation. Anesthestetic sedation was provided intravenously by Anesthesiology: 194.'32mg'$  of Propofol, '60mg'$  of Lidocaine. Image quality was good. The patient's vital signs; including heart rate, blood pressure, and oxygen saturation; remained stable throughout the procedure. The patient developed no complications during the procedure. IMPRESSIONS  1. Left ventricular ejection fraction, by estimation, is 30%. The left ventricle has moderate to severely decreased function. The left ventricle demonstrates global hypokinesis. The left ventricular internal cavity size was mildly dilated.  2. No vegetation noted on pacer wires in RA/RV. Right ventricular systolic function is mildly reduced. The right ventricular size is mildly enlarged. There is severely elevated pulmonary artery systolic pressure. The estimated right ventricular systolic  pressure is 34.1 mmHg.  3. The LA appendage has been surgically ligated. Left atrial size was moderately dilated. No left atrial/left atrial appendage thrombus was detected.  4. Right atrial size was mildly dilated.  5. The posterior mitral leaflet was calcified and restricted, mild mitral regurgitation and no stenosis. No vegetation on the MV.  6.  Trileaflet aortic valve. Lambl's excrescence was present but no vegetation. No regurgitation or stenosis.  7. There was a small, < 1 cm, vegetation on the tricuspid valve.. The tricuspid valve is abnormal. Tricuspid valve regurgitation is moderate to severe.  8. The inferior vena cava is dilated in size with <50% respiratory variability, suggesting right atrial pressure of 15 mmHg.  9. No PFO or ASD by color doppler. 10. Normal caliber thoracic aorta with grade 3 plaque in descending thoracic aorta.  FINDINGS  Left Ventricle: Left ventricular ejection fraction, by estimation, is 30%. The left ventricle has moderate to severely decreased function. The left ventricle demonstrates global hypokinesis. The left ventricular internal cavity size was mildly dilated. There is no left ventricular hypertrophy. Right Ventricle: No vegetation noted on pacer wires in RA/RV. The right ventricular size is mildly enlarged. No increase in right ventricular wall thickness. Right ventricular systolic function is mildly reduced. There is severely elevated pulmonary artery systolic pressure. The tricuspid regurgitant velocity is 3.37 m/s, and with an assumed right atrial pressure of 15 mmHg, the estimated right ventricular systolic pressure is 71.0 mmHg. Left Atrium: The LA appendage has been surgically ligated. Left atrial size was moderately dilated. No left atrial/left atrial appendage thrombus was detected. Right Atrium: Right atrial size was mildly dilated. Pericardium: There is no evidence of pericardial effusion. Mitral Valve: The posterior mitral leaflet was calcified and restricted, mild mitral regurgitation and no stenosis. No vegetation on the MV. The mitral valve is abnormal. Mild mitral valve regurgitation. Tricuspid Valve: There was a small, < 1 cm, vegetation on the tricuspid valve. The tricuspid valve is abnormal. Tricuspid valve regurgitation is moderate to severe. Aortic Valve: Trileaflet aortic valve. Lambl's  excrescence was present but no vegetation. No regurgitation or stenosis. The aortic valve is tricuspid. Aortic valve regurgitation is not visualized. Pulmonic Valve: The pulmonic valve was normal in structure. Pulmonic valve regurgitation is not visualized. Aorta: Normal caliber thoracic aorta with grade 3 plaque in descending thoracic aorta. The aortic root is normal in size and structure. Venous: The inferior vena cava is dilated in size with less than 50% respiratory variability, suggesting right atrial pressure of 15 mmHg. IAS/Shunts: No PFO or ASD by color doppler. Additional Comments: A device lead is visualized in the right ventricle.  IVC IVC diam: 2.50 cm TRICUSPID VALVE TR Peak grad:   45.4 mmHg TR Vmax:        337.00 cm/s Anapaola Kinsel McleanMD Electronically signed by Franki Monte Signature Date/Time: 06/06/2022/4:13:37 PM    Final      Medications:     Scheduled Medications:  apixaban  2.5 mg Oral BID   Gerhardt's butt cream   Topical BID   insulin aspart  0-5 Units Subcutaneous QHS   insulin aspart  0-6 Units Subcutaneous TID WC   isosorbide-hydrALAZINE  0.5 tablet Oral TID   levothyroxine  112 mcg Oral Q0600   metoprolol succinate  50 mg Oral BID   rosuvastatin  5 mg Oral QODAY   sodium chloride flush  3 mL Intravenous Q12H   umeclidinium-vilanterol  1 puff Inhalation Daily    Infusions:  ampicillin (OMNIPEN) IV 2 g (06/07/22 0516)   cefTRIAXone (ROCEPHIN)  IV 2 g (06/06/22 2135)    PRN Medications: acetaminophen **OR** acetaminophen, ondansetron **OR** ondansetron (ZOFRAN) IV, senna-docusate    Assessment/Plan   1. Acute on chronic systolic CHF: Ischemic cardiomyopathy. St Jude CRT-D device, now s/p AV nodal ablation. Echo in 3/21 showed EF 20% with diffuse hypokinesis, mildly dilated RV with mildly decreased systolic function, PASP 49 mmHg, dilated IVC. Echo 12/22 EF 20-25%, mildly decreased RV function. Echo 10/23 EF 25 to 30%, global hypokinesis, mild dyskinesis of the  left ventricular entire apical segment. Moderate reduction in RV systolic function, RVSP 62.6 mmHg, moderate to severe TR. Presented NYHA III-IIIb. Limited at baseline d/t peripheral neuropathy. On exam, she still looks volume overloaded.  Creatinine pending today 2.3 yesterday - Will give 1 more day of IV Lasix.  -  Unable to tolerate Bidil 1 tab tid, continue Bidil 1/2 tab tid  - On Toprol XL 100 qam/50 qpm at home, '50mg'$  BID here - Off empagliflozin due to yeast infection and UTI.  - Off spironolactone with side effects.  - Place UNNA boots  - Strict I&O, daily weights 2. CAD: S/p CABG in 2015. No chest pain.   - No ASA given apixaban use.  - Continue Crestor 5 daily + Zetia, good lipids 12/22 3. Atrial fibrillation: Chronic now, looks like it has gone on since 2018.  Unable to cardiovert her on amiodarone so it was stopped.  At this point Afib chronic. She is now s/p AVN ablation to allow more BiV pacing. BiV-paced. - Continue apixaban.   4. CKD: Stage 4: SCr baseline 2.1-2.5. 2.25 => 2.3 today. Watch creatinine closely.  5. 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  MRI back shows T8-9 discitis and osteomyelitis with fluid collection. TEE today showed a small (<1 cm) vegetation on the tricuspid valve, does not appear to involve device leads though in close proximity.  - Management per ID, will make EP aware of endocarditis given device presence.   6. DM  - SSI per primary 7. E faecalis endocarditis - TEE showed a small (<1 cm) vegetation on the tricuspid valve, does not appear to involve device leads though in close proximity.  - ID treating w/ IV ampicillin and ceftriaxone for E faecalis endocarditis - EP aware d/t proximity to leads  Length of Stay: [place], AGACNP-BC  06/07/2022, 8:06 AM  Advanced Heart Failure Team Pager [phone] (M-F; 7a - 5p)  Please contact [facility] for night-coverage after hours (5p -7a ) and  weekends on [email or web address]   Patient seen with NP, agree with the above note.  TEE yesterday showed a small tricuspid valve vegetation with moderate-severe TR.  ID has transitioned her to ceftriaxone.    She diuresed well yesterday, weight down 2 lbs and creatinine down 2.3 => 2.21.   Breathing better, off oxygen.   General: NAD Neck: JVP 9-10 cm, no thyromegaly or thyroid nodule.  Lungs: Clear to auscultation bilaterally with normal respiratory effort. CV: Nondisplaced PMI.  Heart regular S1/S2, no S3/S4, 2/6 HSM LLSB.  Trace ankle edema.  Abdomen: Soft, nontender, no hepatosplenomegaly, no distention.  Skin: Intact without lesions or rashes.  Neurologic: Alert and oriented x 3.  Psych: Normal affect. Extremities: No clubbing or cyanosis.  HEENT: Normal.   Tricuspid valve E faecalis endocarditis in patient with CRT-D device.  Discussed with EP (Dr. Curt Bears).  She would not be a good candidate for device explantation given severity of CHF and device dependence (has had AV nodal ablation).  - Continue treatment with ceftriaxone per ID.   She still is at least mildly volume overloaded on exam, creatinine is a little lower at 2.21.  - Lasix 80 mg IV bid today, transition back to po torsemide likely tomorrow.  - Continue Toprol XL and Bidil, has not tolerated SGLT2 inhibitors or spironolactone.   She has limited mobility at baseline.  Work with PT.   Loralie Champagne 06/07/2022 9:58 AM

## 2022-06-08 ENCOUNTER — Encounter (HOSPITAL_COMMUNITY): Payer: Self-pay | Admitting: Cardiology

## 2022-06-08 ENCOUNTER — Inpatient Hospital Stay: Payer: Self-pay

## 2022-06-08 DIAGNOSIS — I5023 Acute on chronic systolic (congestive) heart failure: Secondary | ICD-10-CM | POA: Diagnosis not present

## 2022-06-08 LAB — GLUCOSE, CAPILLARY
Glucose-Capillary: 154 mg/dL — ABNORMAL HIGH (ref 70–99)
Glucose-Capillary: 131 mg/dL — ABNORMAL HIGH (ref 70–99)
Glucose-Capillary: 145 mg/dL — ABNORMAL HIGH (ref 70–99)
Glucose-Capillary: 199 mg/dL — ABNORMAL HIGH (ref 70–99)
Glucose-Capillary: 234 mg/dL — ABNORMAL HIGH (ref 70–99)
Glucose-Capillary: 201 mg/dL — ABNORMAL HIGH (ref 70–99)

## 2022-06-08 LAB — BASIC METABOLIC PANEL
Anion gap: 12 (ref 5–15)
BUN: 62 mg/dL — ABNORMAL HIGH (ref 8–23)
CO2: 23 mmol/L (ref 22–32)
Calcium: 9 mg/dL (ref 8.9–10.3)
Chloride: 99 mmol/L (ref 98–111)
Creatinine, Ser: 2.19 mg/dL — ABNORMAL HIGH (ref 0.44–1.00)
GFR, Estimated: 22 mL/min — ABNORMAL LOW (ref >60)
Glucose, Bld: 155 mg/dL — ABNORMAL HIGH (ref 70–99)
Potassium: 4 mmol/L (ref 3.5–5.1)
Sodium: 134 mmol/L — ABNORMAL LOW (ref 135–145)

## 2022-06-08 LAB — MAGNESIUM: Magnesium: 2 mg/dL (ref 1.7–2.4)

## 2022-06-08 MED ORDER — TORSEMIDE 20 MG PO TABS
40.0000 mg | ORAL_TABLET | Freq: Every day | ORAL | Status: DC
  Administered 2022-06-08: 40 mg via ORAL
  Filled 2022-06-08: qty 2

## 2022-06-08 NOTE — Progress Notes (Signed)
PROGRESS NOTE    Elaine [name]  RWE:[id number] DOB: [birth year] DOA: 06/02/2022 PCP: Ginger Organ., MD  81/F with history of chronic systolic CHF, chronic hypoxic respiratory failure on home O2, COPD, paroxysmal A-fib, CAD presented to the ED with worsening dyspnea, edema, dysuria and back pain. Chest x-ray noted small left pleural effusion, CT abdomen pelvis with inflammation at the urinary bladder. -Admitted, started on diuretics -She was then noted to have positive blood cultures with Enterococcus -Infectious disease was consulted, subsequently underwent a TEE which noted tricuspid valve vegetation consistent with endocarditis -MRI positive for discitis, osteomyelitis at T8, T9, no epidural abscess   Subjective: -feels better overall, breathing and back pain is improving  Assessment and Plan:  Acute on chronic systolic CHF  -Echo EF 25 to 30%, Moderate reduction in RV systolic function, Moderate to severe TR  -Known ischemic cardiomyopathy -Diuresed with IV Lasix, switched to oral torsemide, per CHF team -Also remains on BiDil, Toprol -Intolerant to SGLT2 and Aldactone -PT OT consulted, SNF recommended, TOC following  Endocarditis of tricuspid valve T8, T9 discitis/osteomyelitis Enterococcus faecalis bacteremia and UTI -TEE positive for small vegetation on tricuspid valve, did not appear to involve device leads, cards to make EP aware -Appreciate infectious disease input -Remains on IV ampicillin and ceftriaxone -Repeat blood cultures from 10/8 negative X 4 days -Place PICC line once cleared by ID, then discharge planning, SNF  Permanent atrial fibrillation - CHA2DS2-VASc Score 6, on Eliquis -Continue Toprol and apixaban, off amiodarone  AKI on CKD stage IV -Creatinine now stable at baseline of 2.1-2.5 -Stable monitor on diuretics  CAD status post CABG Patient with no chest pain.   Essential hypertension -Stable continue Toprol and BiDil  Type 2 diabetes  mellitus with hyperlipidemia (HCC) -Stable continue SSI  Chronic respiratory failure with hypoxia (HCC) COPD -Stable, continue current inhalers  Hypothyroidism Continue with levothyroxine   Class 3 obesity (HCC) Calculated BMI 40,2   DVT prophylaxis: Eliquis Code Status: Full Family Communication: No family at bedside, called and updated son 10/11 Disposition Plan: TOC following, plan for SNF in 1 to 2 days  Consultants:    Procedures:   Antimicrobials:    Objective: Vitals:   06/08/22 0610 06/08/22 0800 06/08/22 0910 06/08/22 1140  BP: (!) 112/56 (!) 122/46  104/61  Pulse: 73 73 73 73  Resp: '16 18 20   '$ Temp: 98 F (36.7 C) 98 F (36.7 C)    TempSrc: Oral Oral    SpO2: 96% 95% 96%   Weight: 103.8 kg     Height:        Intake/Output Summary (Last 24 hours) at 06/08/2022 1154 Last data filed at 06/08/2022 0505 Gross per 24 hour  Intake 927.06 ml  Output 525 ml  Net 402.06 ml   Filed Weights   06/06/22 0711 06/07/22 0452 06/08/22 0610  Weight: 102.7 kg 101.7 kg 103.8 kg    Examination:  General exam: Pleasant elderly female laying in bed, AAOx3 HEENT: No JVD CVS: S1-S2, regular rhythm, systolic murmur Lungs: Clear anteriorly Abdomen: Soft, nontender, bowel sounds present Extremities: Trace edema  Neuro: Moves all extremities, no localizing signs  Data Reviewed:   CBC: Recent Labs  Lab 06/02/22 1538 06/03/22 0243 06/03/22 0740 06/04/22 0705 06/05/22 0242 06/06/22 0056  WBC 14.0*  --  12.7* 10.4 9.3 7.7  HGB 9.9* 10.2* 9.3* 9.2* 8.4* 9.7*  HCT 29.1* 30.0* 28.6* 27.2* 25.1* 28.5*  MCV 87.9  --  89.1 85.5 86.0 84.1  PLT  293  --  243 216 206 250   Basic Metabolic Panel: Recent Labs  Lab 06/04/22 0705 06/05/22 0242 06/06/22 0056 06/07/22 0736 06/08/22 0146  NA 136 134* 129* 135 134*  K 4.0 4.1 3.5 3.8 4.0  CL 96* 99 94* 98 99  CO2 27 24 21* 24 23  GLUCOSE 174* 156* 141* 139* 155*  BUN 50* 62* 65* 61* 62*  CREATININE 1.91* 2.25*  2.30* 2.21* 2.19*  CALCIUM 9.6 8.5* 8.8* 9.1 9.0  MG 2.0  --   --  2.0 2.0   GFR: Estimated Creatinine Clearance: 22.8 mL/min (A) (by C-G formula based on SCr of 2.19 mg/dL (H)). Liver Function Tests: Recent Labs  Lab 06/02/22 1538 06/03/22 0740 06/04/22 0705 06/05/22 0242  AST 36 31 38 28  ALT '28 27 27 26  '$ ALKPHOS 104 101 109 97  BILITOT 2.6* 2.0* 1.5* 1.1  PROT 7.4 6.9 6.4* 6.1*  ALBUMIN 2.6* 2.4* 2.2* 1.9*   Recent Labs  Lab 06/02/22 1538  LIPASE 69*   No results for input(s): "AMMONIA" in the last 168 hours. Coagulation Profile: No results for input(s): "INR", "PROTIME" in the last 168 hours. Cardiac Enzymes: No results for input(s): "CKTOTAL", "CKMB", "CKMBINDEX", "TROPONINI" in the last 168 hours. BNP (last 3 results) No results for input(s): "PROBNP" in the last 8760 hours. HbA1C: No results for input(s): "HGBA1C" in the last 72 hours. CBG: Recent Labs  Lab 06/07/22 1235 06/07/22 1515 06/08/22 0054 06/08/22 0743 06/08/22 1037  GLUCAP 224* 157* 154* 131* 145*   Lipid Profile: No results for input(s): "CHOL", "HDL", "LDLCALC", "TRIG", "CHOLHDL", "LDLDIRECT" in the last 72 hours. Thyroid Function Tests: No results for input(s): "TSH", "T4TOTAL", "FREET4", "T3FREE", "THYROIDAB" in the last 72 hours. Anemia Panel: No results for input(s): "VITAMINB12", "FOLATE", "FERRITIN", "TIBC", "IRON", "RETICCTPCT" in the last 72 hours. Urine analysis:    Component Value Date/Time   COLORURINE YELLOW 06/03/2022 0440   APPEARANCEUR TURBID (A) 06/03/2022 0440   LABSPEC 1.012 06/03/2022 0440   PHURINE 5.0 06/03/2022 0440   GLUCOSEU NEGATIVE 06/03/2022 0440   HGBUR LARGE (A) 06/03/2022 0440   BILIRUBINUR NEGATIVE 06/03/2022 0440   KETONESUR NEGATIVE 06/03/2022 0440   PROTEINUR 100 (A) 06/03/2022 0440   UROBILINOGEN 0.2 02/02/2014 1943   NITRITE NEGATIVE 06/03/2022 0440   LEUKOCYTESUR LARGE (A) 06/03/2022 0440   Sepsis  Labs: '@LABRCNTIP'$ (procalcitonin:4,lacticidven:4)  ) Recent Results (from the past 240 hour(s))  Urine Culture     Status: Abnormal   Collection Time: 06/03/22  4:54 AM   Specimen: Urine, Catheterized  Result Value Ref Range Status   Specimen Description URINE, CATHETERIZED  Final   Special Requests   Final    NONE Performed at [facility], [address] [place], [place] [address]    Culture 80,000 COLONIES/mL ENTEROCOCCUS FAECALIS (A)  Final   Report Status 06/05/2022 FINAL  Final   Organism ID, Bacteria ENTEROCOCCUS FAECALIS (A)  Final      Susceptibility   Enterococcus faecalis - MIC*    AMPICILLIN <=2 SENSITIVE Sensitive     NITROFURANTOIN <=16 SENSITIVE Sensitive     VANCOMYCIN 1 SENSITIVE Sensitive     * 80,000 COLONIES/mL ENTEROCOCCUS FAECALIS  Blood culture (routine x 2)     Status: Abnormal   Collection Time: 06/03/22  5:11 AM   Specimen: BLOOD  Result Value Ref Range Status   Specimen Description BLOOD RIGHT ANTECUBITAL  Final   Special Requests   Final    BOTTLES DRAWN AEROBIC  AND ANAEROBIC Blood Culture adequate volume   Culture  Setup Time   Final    GRAM POSITIVE COCCI IN CHAINS IN BOTH AEROBIC AND ANAEROBIC BOTTLES CRITICAL RESULT CALLED TO, READ BACK BY AND VERIFIED WITH: Pamala Hurry [id number] AT 2013 BY EC Performed at [facility], [place] [address], [place], [place] [address]    Culture ENTEROCOCCUS FAECALIS (A)  Final   Report Status 06/05/2022 FINAL  Final   Organism ID, Bacteria ENTEROCOCCUS FAECALIS  Final      Susceptibility   Enterococcus faecalis - MIC*    AMPICILLIN <=2 SENSITIVE Sensitive     VANCOMYCIN 1 SENSITIVE Sensitive     GENTAMICIN SYNERGY SENSITIVE Sensitive     * ENTEROCOCCUS FAECALIS  Blood Culture ID Panel (Reflexed)     Status: Abnormal   Collection Time: 06/03/22  5:11 AM  Result Value Ref Range Status   Enterococcus faecalis DETECTED (A) NOT DETECTED Final    Comment: CRITICAL RESULT CALLED TO, READ BACK BY AND  VERIFIED WITH: C/  PHARMD LYDIA CHEN [id number] AT 2013 BY EC CRITICAL COMMENT ADDED BY A. LAFRANCE 06/03/22 2132     Enterococcus Faecium NOT DETECTED NOT DETECTED Final   Listeria monocytogenes NOT DETECTED NOT DETECTED Final   Staphylococcus species NOT DETECTED NOT DETECTED Final   Staphylococcus aureus (BCID) NOT DETECTED NOT DETECTED Final   Staphylococcus epidermidis NOT DETECTED NOT DETECTED Final   Staphylococcus lugdunensis NOT DETECTED NOT DETECTED Final   Streptococcus species NOT DETECTED NOT DETECTED Final   Streptococcus agalactiae NOT DETECTED NOT DETECTED Final   Streptococcus pneumoniae NOT DETECTED NOT DETECTED Final   Streptococcus pyogenes NOT DETECTED NOT DETECTED Final   A.calcoaceticus-baumannii NOT DETECTED NOT DETECTED Final   Bacteroides fragilis NOT DETECTED NOT DETECTED Final   Enterobacterales NOT DETECTED NOT DETECTED Final   Enterobacter cloacae complex NOT DETECTED NOT DETECTED Final   Escherichia coli NOT DETECTED NOT DETECTED Final   Klebsiella aerogenes NOT DETECTED NOT DETECTED Final   Klebsiella oxytoca NOT DETECTED NOT DETECTED Final   Klebsiella pneumoniae NOT DETECTED NOT DETECTED Final   Proteus species NOT DETECTED NOT DETECTED Final   Salmonella species NOT DETECTED NOT DETECTED Final   Serratia marcescens NOT DETECTED NOT DETECTED Final   Haemophilus influenzae NOT DETECTED NOT DETECTED Final   Neisseria meningitidis NOT DETECTED NOT DETECTED Final   Pseudomonas aeruginosa NOT DETECTED NOT DETECTED Final   Stenotrophomonas maltophilia NOT DETECTED NOT DETECTED Final   Candida albicans NOT DETECTED NOT DETECTED Final   Candida auris NOT DETECTED NOT DETECTED Final   Candida glabrata NOT DETECTED NOT DETECTED Final   Candida krusei NOT DETECTED NOT DETECTED Final   Candida parapsilosis NOT DETECTED NOT DETECTED Final   Candida tropicalis NOT DETECTED NOT DETECTED Final   Cryptococcus neoformans/gattii NOT DETECTED NOT DETECTED Final    Vancomycin resistance NOT DETECTED NOT DETECTED Final    Comment: Performed at [facility], [address], [place], [place] [address]  Blood culture (routine x 2)     Status: Abnormal   Collection Time: 06/03/22  5:15 AM   Specimen: BLOOD  Result Value Ref Range Status   Specimen Description BLOOD SITE NOT SPECIFIED  Final   Special Requests   Final    BOTTLES DRAWN AEROBIC AND ANAEROBIC Blood Culture adequate volume   Culture  Setup Time   Final    GRAM POSITIVE COCCI IN CHAINS IN BOTH AEROBIC AND ANAEROBIC BOTTLES CRITICAL VALUE NOTED.  VALUE  IS CONSISTENT WITH PREVIOUSLY REPORTED AND CALLED VALUE.    Culture (A)  Final    ENTEROCOCCUS FAECALIS SUSCEPTIBILITIES PERFORMED ON PREVIOUS CULTURE WITHIN THE LAST 5 DAYS. Performed at [facility], [place] [address] [place], [place] [address]    Report Status 06/05/2022 FINAL  Final  Culture, blood (Routine X 2) w Reflex to ID Panel     Status: None (Preliminary result)   Collection Time: 06/04/22  7:05 AM   Specimen: BLOOD  Result Value Ref Range Status   Specimen Description BLOOD VENOUS  Final   Special Requests   Final    BOTTLES DRAWN AEROBIC AND ANAEROBIC Blood Culture results may not be optimal due to an inadequate volume of blood received in culture bottles   Culture   Final    NO GROWTH 4 DAYS Performed at [facility], [place] [address], [place], [place] [address]    Report Status PENDING  Incomplete  Culture, blood (Routine X 2) w Reflex to ID Panel     Status: None (Preliminary result)   Collection Time: 06/04/22  7:06 AM   Specimen: BLOOD  Result Value Ref Range Status   Specimen Description BLOOD LEFT ANTECUBITAL  Final   Special Requests   Final    BOTTLES DRAWN AEROBIC AND ANAEROBIC Blood Culture results may not be optimal due to an inadequate volume of blood received in culture bottles   Culture   Final    NO GROWTH 4 DAYS Performed at [facility], [place] [address], [place], [place] [address]     Report Status PENDING  Incomplete     Radiology Studies: CUP PACEART REMOTE DEVICE CHECK  Result Date: 06/07/2022 Scheduled remote reviewed. Normal device function.  corvue with downward trend x16 days, followed by ICM Next remote 91 days. LA    Scheduled Meds:  apixaban  2.5 mg Oral BID   Gerhardt's butt cream   Topical BID   insulin aspart  0-5 Units Subcutaneous QHS   insulin aspart  0-6 Units Subcutaneous TID WC   isosorbide-hydrALAZINE  0.5 tablet Oral TID   levothyroxine  112 mcg Oral Q0600   metoprolol succinate  50 mg Oral BID   rosuvastatin  5 mg Oral QODAY   sodium chloride flush  3 mL Intravenous Q12H   umeclidinium-vilanterol  1 puff Inhalation Daily   Continuous Infusions:  ampicillin (OMNIPEN) IV Stopped (06/08/22 0247)   cefTRIAXone (ROCEPHIN)  IV Stopped (06/08/22 0047)     LOS: 5 days    Time spent:  37mn    PDomenic Polite MD [facility]   06/08/2022, 11:54 AM

## 2022-06-08 NOTE — Progress Notes (Signed)
Patient ID: Elaine [name], female   DOB: [birth year], 81 y.o.   MRN: [id number]     Advanced Heart Failure Rounding Note  PCP-Cardiologist: Glenetta Hew, MD   Subjective:    TEE: Mildly dilated LV with EF 30%, mild RV enlargement with mild RV dysfunction, mod-severe TR with < 1 cm tricuspid valve vegetation.    MRI spine: Discitis/osteomyelitis at T8-T9 with small fluid collection.    Continues on IV ceftriaxone and IV ampicillin per ID.  Feeling much better after IV diuresis yesterday. No dyspnea today. Increased appetite.    Objective:   Weight Range: 103.8 kg Body mass index is 41.85 kg/m.   Vital Signs:   Temp:  [97.5 F (36.4 C)-98 F (36.7 C)] 97.5 F (36.4 C) (10/12 1200) Pulse Rate:  [72-131] 72 (10/12 1200) Resp:  [16-20] 18 (10/12 1200) BP: (98-134)/(46-63) 98/49 (10/12 1200) SpO2:  [89 %-96 %] 96 % (10/12 0910) Weight:  [103.8 kg] 103.8 kg (10/12 0610) Last BM Date : 06/07/22  Weight change: Filed Weights   06/06/22 0711 06/07/22 0452 06/08/22 0610  Weight: 102.7 kg 101.7 kg 103.8 kg    Intake/Output:   Intake/Output Summary (Last 24 hours) at 06/08/2022 1402 Last data filed at 06/08/2022 1200 Gross per 24 hour  Intake 1467.06 ml  Output 625 ml  Net 842.06 ml      Physical Exam    General:  Elderly female sitting up in chair, no distress. HEENT: normal Neck: supple. JVP not elevated. Carotids 2+ bilat; no bruits.  Cor: PMI nondisplaced. Regular rate & rhythm. No rubs, gallops, 2/6 HSM LLSB Lungs: diminished Abdomen: obese, soft, nontender, nondistended. No hepatosplenomegaly.  Extremities: no cyanosis, clubbing, rash, trace edema Neuro: alert & orientedx3, cranial nerves grossly intact. moves all 4 extremities w/o difficulty. Affect pleasant   Telemetry   Afib, BiV paced 70s   Labs    CBC Recent Labs    06/06/22 0056  WBC 7.7  HGB 9.7*  HCT 28.5*  MCV 84.1  PLT 967   Basic Metabolic Panel Recent Labs    06/07/22 0736  06/08/22 0146  NA 135 134*  K 3.8 4.0  CL 98 99  CO2 24 23  GLUCOSE 139* 155*  BUN 61* 62*  CREATININE 2.21* 2.19*  CALCIUM 9.1 9.0  MG 2.0 2.0   Liver Function Tests No results for input(s): "AST", "ALT", "ALKPHOS", "BILITOT", "PROT", "ALBUMIN" in the last 72 hours.  No results for input(s): "LIPASE", "AMYLASE" in the last 72 hours. Cardiac Enzymes No results for input(s): "CKTOTAL", "CKMB", "CKMBINDEX", "TROPONINI" in the last 72 hours.  BNP: BNP (last 3 results) Recent Labs    10/26/21 1056 11/29/21 1057 06/03/22 0228  BNP 479.3* 396.3* 1,191.1*    ProBNP (last 3 results) No results for input(s): "PROBNP" in the last 8760 hours.   D-Dimer No results for input(s): "DDIMER" in the last 72 hours. Hemoglobin A1C No results for input(s): "HGBA1C" in the last 72 hours. Fasting Lipid Panel No results for input(s): "CHOL", "HDL", "LDLCALC", "TRIG", "CHOLHDL", "LDLDIRECT" in the last 72 hours. Thyroid Function Tests No results for input(s): "TSH", "T4TOTAL", "T3FREE", "THYROIDAB" in the last 72 hours.  Invalid input(s): "FREET3"  Other results:   Imaging    [place] EKG SITE RITE  Result Date: 06/08/2022 If Site Rite image not attached, placement could not be confirmed due to current cardiac rhythm.    Medications:     Scheduled Medications:  apixaban  2.5 mg Oral BID   Gerhardt's  butt cream   Topical BID   insulin aspart  0-5 Units Subcutaneous QHS   insulin aspart  0-6 Units Subcutaneous TID WC   isosorbide-hydrALAZINE  0.5 tablet Oral TID   levothyroxine  112 mcg Oral Q0600   metoprolol succinate  50 mg Oral BID   rosuvastatin  5 mg Oral QODAY   sodium chloride flush  3 mL Intravenous Q12H   torsemide  40 mg Oral Daily   umeclidinium-vilanterol  1 puff Inhalation Daily    Infusions:  ampicillin (OMNIPEN) IV Stopped (06/08/22 0247)   cefTRIAXone (ROCEPHIN)  IV Stopped (06/08/22 0047)    PRN Medications: acetaminophen **OR** acetaminophen,  camphor-menthol, diphenhydrAMINE-zinc acetate, melatonin, ondansetron **OR** ondansetron (ZOFRAN) IV, senna-docusate    Assessment/Plan   1. Acute on chronic systolic CHF: Ischemic cardiomyopathy. St Jude CRT-D device, now s/p AV nodal ablation. Echo in 3/21 showed EF 20% with diffuse hypokinesis, mildly dilated RV with mildly decreased systolic function, PASP 49 mmHg, dilated IVC. Echo 12/22 EF 20-25%, mildly decreased RV function. Echo 10/23 EF 25 to 30%, mild dyskinesis of the left ventricular entire apical segment. Moderate reduction in RV systolic function, RVSP 31.5 mmHg, moderate to severe TR.  - Presented NYHA III-IIIb. Limited at baseline d/t peripheral neuropathy. Volume improved on exam. Stop IV lasix. Transition to po Torsemide 40 mg daily (on 60 daily PTA). - Unable to tolerate Bidil 1 tab tid, continue Bidil 1/2 tab tid  - On Toprol XL 100 qam/50 qpm at home, '50mg'$  BID here - Off empagliflozin due to yeast infection and UTI.  - Off spironolactone with side effects.  2. CAD: S/p CABG in 2015. No chest pain.   - No ASA given apixaban use.  - Continue Crestor 5 daily + Zetia, good lipids 12/22 3. Atrial fibrillation: Chronic now, looks like it has gone on since 2018.  Unable to cardiovert her on amiodarone so it was stopped.  At this point Afib chronic. She is now s/p AVN ablation to allow more BiV pacing. BiV-paced. - Continue apixaban.   4. CKD: Stage 4: SCr baseline 2.1-2.5. 2.2 today. Watch creatinine closely.  5. 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  MRI back shows T8-9 discitis and osteomyelitis with fluid collection. TEE showed a small (<1 cm) vegetation on the tricuspid valve, does not appear to involve device leads though in close proximity.  - Abx management per ID. Discussed with EP. Not a good candidate for device explantation given severity of CHF and deivce dependence (hx AV nodal ablation)  6. DM  - SSI per  primary 7. E faecalis endocarditis - TEE showed a small (<1 cm) vegetation on the tricuspid valve, does not appear to involve device leads though in close proximity.  - ID treating w/ IV ampicillin and ceftriaxone X 6 weeks. Will need PICC. - No plan for ICD extraction as above  Deconditioned. PT/OT following. SNF for rehab recommended at discharge.  Length of Stay: [place], PA-C 06/08/2022, 2:02 PM  Advanced Heart Failure Team Pager [phone] (M-F; 7a - 5p)  Please contact [facility] for night-coverage after hours (5p -7a ) and weekends on [email or web address]

## 2022-06-08 NOTE — TOC Progression Note (Signed)
Transition of Care [facility]) - Progression Note    Patient Details  Name: Elaine [name] MRN: [id number] Date of Birth: 01-19-1941  Transition of Care [facility]) CM/SW [place], [place] Phone Number: 06/08/2022, 12:52 PM  Clinical Narrative:     Patient has SNF bed at [facility] when medically ready for dc. Patients insurance authorization is good through tomorrow 10/13. Juliann Pulse with [facility] ([facility]) confirmed patient can dc over on IV antibiotics when medically ready for dc. CSW will continue to follow and assist with patients dc planning needs.  Expected Discharge Plan: [place] Barriers to Discharge: Continued Medical Work up  Expected Discharge Plan and Services Expected Discharge Plan: [place] Choice: [place] arrangements for the past 2 months: Single Family Home                                       Social Determinants of Health (SDOH) Interventions    Readmission Risk Interventions     No data to display

## 2022-06-08 NOTE — Progress Notes (Signed)
[facility] Infectious Diseases Follow Up Note  Patient Identification: Patient Name: Elaine [name] MRN: [id number] [place] Date: 06/02/2022  3:09 PM Age: 81 y.o.Today's Date: 06/08/2022  Reason for Visit: E faecalis bacteremia/ TV endocarditis   Principal Problem:   Acute on chronic systolic CHF (congestive heart failure) (HCC) Active Problems:   Type 2 diabetes mellitus with hyperlipidemia (HCC)   Essential hypertension   Permanent atrial fibrillation (HCC) - CHA2DS2-VASc Score 6, on Eliquis   Acute kidney injury superimposed on chronic kidney disease (HCC)   CAD status post CABG   Hypothyroidism   Chronic respiratory failure with hypoxia (HCC)   Endocarditis of tricuspid valve   Class 3 obesity (HCC)   Bacteremia   Antibiotics:  Ceftriaxone 10/6, 10/10-c Cephalexin 10/7 Ampicillin 10/7-c  Lines/Hardwares: AICD, clipping of atrial appendage, rt and left  hip THA  Interval Events: Continues to be afebrile, no leukocytosis   Assessment 81 year old female with PMH of CAD status post CABG and atrial appendage clipping, A-fib on AC, CHF/cardiomyopathy status post ICD, COPD admitted with UTI symptoms, malaise and leg swelling with sepsis presentation.  Found to have  # E faecalis bacteremia, likely urinary source including # Native tricuspid valve endocarditis and  # Discitis/osteomyelitis at T8-T9 with a small fluid collection along the left lateral aspect of the T8-T9 measuring approximately 1.9 x 1.3 cm -discussed with interventional radiologist Dr Estanislado Pandy, aspiration/biopsy with more risk than benefit given miniscule size  Repeat blood culture 10/8 no growth in 4 days  TEE 10/10  tricuspid valve vegetation, Pacer wires in RV do not show vegetation.  Case discussed with EP by Dr Billee Cashing lean, poor candidate for device extraction  given severity of CHF  and device dependence  # CKD   Recommendations PICC  Continue IV  ampicillin and IV ceftriaxone to complete 6 weeks of tx from 10/8. May need extension of IV ampicillin vs switch to PO amoxicillin in the setting of vertebral infection and possibly re-image to be done OP Monitor CBC and BMP  ID pharmacy to place OPAT orders  Fu in [place] [facility] arranged. Otherwise ID will sign off.  Please re-call if needed.    Rest of the management as per the primary team. Thank you for the consult. Please page with pertinent questions or concerns.  ______________________________________________________________________ Subjective patient seen and examined at the bedside.  Past Medical History:  Diagnosis Date   Acute myocardial infarction of anterior wall ([place]) 10/20/2013   Afib, LBBB   Acute pulmonary edema with congestive heart failure ([place]) 10/20/2013   Resolved   AICD (automatic cardioverter/defibrillator) present    Arthritis    Atrial fibrillation ([place]) 10/20/2013   On Warfarin   CAD, multiple vessel 10/20/2013   LM, LAD & RCA --> CABG; MYOVIEW 12/'15: Intermediate Risk would large anterior, anteroapical segment the apex possible infarct and peri-infarct ischemia   CHF (congestive heart failure) (HCC)    EF 40-45%.   Chronic kidney disease    COPD (chronic obstructive pulmonary disease) (HCC)    Diabetic neuropathy (HCC)    Heart murmur    Likely related to aortic sclerosis   Hyperlipidemia    Hypertension    Hypothyroidism    Ischemic cardiomyopathy - Notable Improvement in EF post CABG 10/20/2013   Echo 2/23: EF 20-25%; mild LVH. anteroseptal Akinesis; mid-apical anterior, inferior & inferoseptal + apical-lateral akinesis; G 1 DDysfxn.; Mild-Mod LA dil; mild MR; Mod PHTN;; F/u Echo 12/2013: EF 40-45%, septal & apical HK, mild LVH; Mod LA  dilation   Left bundle branch block (LBBB) on electrocardiogram    Mild mitral regurgitation by prior echocardiogram    Mild-Mod MR on Echo   Morbid obesity ([place])    OSTEOARTHRITIS 08/06/2006   S/P CABG x 3 10/23/2013    LIMA to LAD, SVG to OM2, SVG to RPLB, EVH via right thigh   Type II diabetes mellitus with complication ([place]) 97/35/3299   off rx since heart attack 12/15-dr told could stay off if keep cbg under 150   UTI (lower urinary tract infection) 09/29/2014   ongoing now. started rx 09/28/14   Past Surgical History:  Procedure Laterality Date   ABDOMINAL HYSTERECTOMY     AV NODE ABLATION N/A 05/19/2020   Procedure: AV NODE ABLATION;  Surgeon: Constance Haw, MD;  Location: [facility];  Service: Cardiovascular;  Laterality: N/A;   BIV ICD INSERTION CRT-D N/A 07/28/2019   Procedure: BIV ICD INSERTION CRT-D;  Surgeon: Constance Haw, MD;  Location: [facility];  Service: Cardiovascular;  Laterality: N/A;   BREAST BIOPSY Right 08/04/2016    fibroadenoma with calcifications    BREAST BIOPSY Right 07/16/2014   fibroadenoma with calcifications and atypical   BREAST EXCISIONAL BIOPSY Right 10/06/2014   atypical lobular hyperplasia    BREAST LUMPECTOMY W/ NEEDLE LOCALIZATION Right 10/06/2014   Dr Georgette Dover   BREAST LUMPECTOMY WITH NEEDLE LOCALIZATION Right 10/06/2014   Procedure: RIGHT BREAST LUMPECTOMY WITH NEEDLE LOCALIZATION;  Surgeon: Donnie Mesa, MD;  Location: [facility];  Service: General;  Laterality: Right;   CARDIOVERSION N/A 10/22/2019   Procedure: CARDIOVERSION;  Surgeon: Larey Dresser, MD;  Location: [facility];  Service: Cardiovascular;  Laterality: N/A;   CARPAL TUNNEL RELEASE Right 04/13/2022   Procedure: RIGHT OPEN CARPAL TUNNEL RELEASE;  Surgeon: Mcarthur Rossetti, MD;  Location: [facility];  Service: Orthopedics;  Laterality: Right;   CLIPPING OF ATRIAL APPENDAGE N/A 10/23/2013   Procedure: CLIPPING OF ATRIAL APPENDAGE;  Surgeon: Rexene Alberts, MD;  Location: [facility];  Service: Open Heart Surgery;  Laterality: N/A;   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   CORONARY ARTERY BYPASS GRAFT N/A 10/23/2013   Procedure: CORONARY ARTERY BYPASS GRAFTING (CABG);  Surgeon:  Rexene Alberts, MD;  Location: [facility];  Service: Open Heart Surgery: LIMA-LAD, SVG-OM2, SVG-RPL   INTRAOPERATIVE TRANSESOPHAGEAL ECHOCARDIOGRAM N/A 10/23/2013   Procedure: INTRAOPERATIVE TRANSESOPHAGEAL ECHOCARDIOGRAM;  Surgeon: Rexene Alberts, MD;  Location: [facility];  Service: Open Heart Surgery;  Laterality: N/A;   LEFT HEART CATHETERIZATION WITH CORONARY ANGIOGRAM N/A 10/20/2013   Procedure: LEFT HEART CATHETERIZATION WITH CORONARY ANGIOGRAM;  Surgeon: Leonie Man, MD;  Location: [facility] C[facility] LAB;  Service: Cardiovascular: Distal LM ~70%, LAD - ostial 70%, prox 80, 95 & 95%; RCA prox 70%, mid 80%; minimal Cx disease   LEFT HEART CATHETERIZATION WITH CORONARY/GRAFT ANGIOGRAM N/A 09/08/2014   Procedure: LEFT HEART CATHETERIZATION WITH Beatrix Fetters;  Surgeon: Leonie Man, MD;  Location: [facility] CATH LAB: Indication: Abnormal Myoview. Occluded LAD after 70 and 80% left main. Diffuse RCA disease. Widely patent grafts to Moderately diseased artery vessels. Mild-moderate LV dysfunction and chronic A. fib.   NM MYOVIEW LTD  08/14/2014   Lexi scan: INTERMEDIATE RISK - large, severe intensity perfusion defect in anterior wall, apex and inferior apex that is partly reversible. This suggests either scar or possible hibernating myocardium. Nondeviated.-> Likely scar. No change in coronary anatomy with patent grafts.   TOTAL HIP ARTHROPLASTY  2010, 2011  right 2010, left 2011   TRANSTHORACIC ECHOCARDIOGRAM  12/2013   Mildly dilated left ventricle with mild to moderately reduced function. EF 40-45% (notable improvement from February 2015). Septal and apical hypokinesis. Mild LA dilation.   TRANSTHORACIC ECHOCARDIOGRAM  11/11/2019   EF 20% with severely dysfunction. Global HK. Unable to determine diastolic pressures. Mild RV function-with mildly elevated PAP-RAP estimated 15 mmHg.. Moderate LA dilation, mild RA dilation.   Vitals BP (!) 122/46 (BP Location: Right Arm)   Pulse 73   Temp 98 F (36.7 C)  (Oral)   Resp 20   Ht '5\' 2"'$  (1.575 m)   Wt 103.8 kg   SpO2 96%   BMI 41.85 kg/m     Physical Exam She was seen soon after her TEE and appeared to be somewhat sleepy although arousable to voice On Nasal cannula and appears comfortable  Normal; heart rate but irregular rhythm Normal breath sounds Abdomen - soft, non tender, non distended Lower extremities with mild pedal edema.  Follows commands  Pertinent Microbiology Results for orders placed or performed during the hospital encounter of 06/02/22  Urine Culture     Status: Abnormal   Collection Time: 06/03/22  4:54 AM   Specimen: Urine, Catheterized  Result Value Ref Range Status   Specimen Description URINE, CATHETERIZED  Final   Special Requests   Final    NONE Performed at [facility], [address], [place], [place] [address]    Culture 80,000 COLONIES/mL ENTEROCOCCUS FAECALIS (A)  Final   Report Status 06/05/2022 FINAL  Final   Organism ID, Bacteria ENTEROCOCCUS FAECALIS (A)  Final      Susceptibility   Enterococcus faecalis - MIC*    AMPICILLIN <=2 SENSITIVE Sensitive     NITROFURANTOIN <=16 SENSITIVE Sensitive     VANCOMYCIN 1 SENSITIVE Sensitive     * 80,000 COLONIES/mL ENTEROCOCCUS FAECALIS  Blood culture (routine x 2)     Status: Abnormal   Collection Time: 06/03/22  5:11 AM   Specimen: BLOOD  Result Value Ref Range Status   Specimen Description BLOOD RIGHT ANTECUBITAL  Final   Special Requests   Final    BOTTLES DRAWN AEROBIC AND ANAEROBIC Blood Culture adequate volume   Culture  Setup Time   Final    GRAM POSITIVE COCCI IN CHAINS IN BOTH AEROBIC AND ANAEROBIC BOTTLES CRITICAL RESULT CALLED TO, READ BACK BY AND VERIFIED WITH: Pamala Hurry [id number] AT 2013 BY EC Performed at [facility], [place] [address], [place], [place] [address]    Culture ENTEROCOCCUS FAECALIS (A)  Final   Report Status 06/05/2022 FINAL  Final   Organism ID, Bacteria ENTEROCOCCUS FAECALIS  Final      Susceptibility    Enterococcus faecalis - MIC*    AMPICILLIN <=2 SENSITIVE Sensitive     VANCOMYCIN 1 SENSITIVE Sensitive     GENTAMICIN SYNERGY SENSITIVE Sensitive     * ENTEROCOCCUS FAECALIS  Blood Culture ID Panel (Reflexed)     Status: Abnormal   Collection Time: 06/03/22  5:11 AM  Result Value Ref Range Status   Enterococcus faecalis DETECTED (A) NOT DETECTED Final    Comment: CRITICAL RESULT CALLED TO, READ BACK BY AND VERIFIED WITH: C/  PHARMD LYDIA CHEN [id number] AT 2013 BY EC CRITICAL COMMENT ADDED BY A. LAFRANCE 06/03/22 2132     Enterococcus Faecium NOT DETECTED NOT DETECTED Final   Listeria monocytogenes NOT DETECTED NOT DETECTED Final   Staphylococcus species NOT DETECTED NOT DETECTED Final  Staphylococcus aureus (BCID) NOT DETECTED NOT DETECTED Final   Staphylococcus epidermidis NOT DETECTED NOT DETECTED Final   Staphylococcus lugdunensis NOT DETECTED NOT DETECTED Final   Streptococcus species NOT DETECTED NOT DETECTED Final   Streptococcus agalactiae NOT DETECTED NOT DETECTED Final   Streptococcus pneumoniae NOT DETECTED NOT DETECTED Final   Streptococcus pyogenes NOT DETECTED NOT DETECTED Final   A.calcoaceticus-baumannii NOT DETECTED NOT DETECTED Final   Bacteroides fragilis NOT DETECTED NOT DETECTED Final   Enterobacterales NOT DETECTED NOT DETECTED Final   Enterobacter cloacae complex NOT DETECTED NOT DETECTED Final   Escherichia coli NOT DETECTED NOT DETECTED Final   Klebsiella aerogenes NOT DETECTED NOT DETECTED Final   Klebsiella oxytoca NOT DETECTED NOT DETECTED Final   Klebsiella pneumoniae NOT DETECTED NOT DETECTED Final   Proteus species NOT DETECTED NOT DETECTED Final   Salmonella species NOT DETECTED NOT DETECTED Final   Serratia marcescens NOT DETECTED NOT DETECTED Final   Haemophilus influenzae NOT DETECTED NOT DETECTED Final   Neisseria meningitidis NOT DETECTED NOT DETECTED Final   Pseudomonas aeruginosa NOT DETECTED NOT DETECTED Final   Stenotrophomonas  maltophilia NOT DETECTED NOT DETECTED Final   Candida albicans NOT DETECTED NOT DETECTED Final   Candida auris NOT DETECTED NOT DETECTED Final   Candida glabrata NOT DETECTED NOT DETECTED Final   Candida krusei NOT DETECTED NOT DETECTED Final   Candida parapsilosis NOT DETECTED NOT DETECTED Final   Candida tropicalis NOT DETECTED NOT DETECTED Final   Cryptococcus neoformans/gattii NOT DETECTED NOT DETECTED Final   Vancomycin resistance NOT DETECTED NOT DETECTED Final    Comment: Performed at [facility], [address], [place], [place] [address]  Blood culture (routine x 2)     Status: Abnormal   Collection Time: 06/03/22  5:15 AM   Specimen: BLOOD  Result Value Ref Range Status   Specimen Description BLOOD SITE NOT SPECIFIED  Final   Special Requests   Final    BOTTLES DRAWN AEROBIC AND ANAEROBIC Blood Culture adequate volume   Culture  Setup Time   Final    GRAM POSITIVE COCCI IN CHAINS IN BOTH AEROBIC AND ANAEROBIC BOTTLES CRITICAL VALUE NOTED.  VALUE IS CONSISTENT WITH PREVIOUSLY REPORTED AND CALLED VALUE.    Culture (A)  Final    ENTEROCOCCUS FAECALIS SUSCEPTIBILITIES PERFORMED ON PREVIOUS CULTURE WITHIN THE LAST 5 DAYS. Performed at [facility], [place] [address], [place], [place] [address]    Report Status 06/05/2022 FINAL  Final  Culture, blood (Routine X 2) w Reflex to ID Panel     Status: None (Preliminary result)   Collection Time: 06/04/22  7:05 AM   Specimen: BLOOD  Result Value Ref Range Status   Specimen Description BLOOD VENOUS  Final   Special Requests   Final    BOTTLES DRAWN AEROBIC AND ANAEROBIC Blood Culture results may not be optimal due to an inadequate volume of blood received in culture bottles   Culture   Final    NO GROWTH 3 DAYS Performed at [facility], [place] [address] [place], [place] [address]    Report Status PENDING  Incomplete  Culture, blood (Routine X 2) w Reflex to ID Panel     Status: None (Preliminary result)    Collection Time: 06/04/22  7:06 AM   Specimen: BLOOD  Result Value Ref Range Status   Specimen Description BLOOD LEFT ANTECUBITAL  Final   Special Requests   Final    BOTTLES DRAWN AEROBIC AND ANAEROBIC Blood  Culture results may not be optimal due to an inadequate volume of blood received in culture bottles   Culture   Final    NO GROWTH 3 DAYS Performed at [facility], [place] [address] [place], [place] [address]    Report Status PENDING  Incomplete   Pertinent Lab.    Latest Ref Rng & Units 06/06/2022   12:56 AM 06/05/2022    2:42 AM 06/04/2022    7:05 AM  CBC  WBC 4.0 - 10.5 K/uL 7.7  9.3  10.4   Hemoglobin 12.0 - 15.0 g/dL 9.7  8.4  9.2   Hematocrit 36.0 - 46.0 % 28.5  25.1  27.2   Platelets 150 - 400 K/uL 225  206  216       Latest Ref Rng & Units 06/08/2022    1:46 AM 06/07/2022    7:36 AM 06/06/2022   12:56 AM  CMP  Glucose 70 - 99 mg/dL 155  139  141   BUN 8 - 23 mg/dL 62  61  65   Creatinine 0.44 - 1.00 mg/dL 2.19  2.21  2.30   Sodium 135 - 145 mmol/L 134  135  129   Potassium 3.5 - 5.1 mmol/L 4.0  3.8  3.5   Chloride 98 - 111 mmol/L 99  98  94   CO2 22 - 32 mmol/L '23  24  21   '$ Calcium 8.9 - 10.3 mg/dL 9.0  9.1  8.8      Pertinent Imaging today Plain films and CT images have been personally visualized and interpreted; radiology reports have been reviewed. Decision making incorporated into the Impression / Recommendations.  CUP PACEART REMOTE DEVICE CHECK  Result Date: 06/07/2022 Scheduled remote reviewed. Normal device function.  corvue with downward trend x16 days, followed by ICM Next remote 91 days. LA  ECHO TEE  Result Date: 06/06/2022    TRANSESOPHOGEAL ECHO REPORT   Patient Name:   Elaine [name] Date of Exam: 06/06/2022 Medical Rec #:  [id number]     Height:       62.0 in Accession #:    [id number]    Weight:       226.4 lb Date of Birth:  1941-05-01     BSA:          2.015 m Patient Age:    70 years      BP:           97/61 mmHg Patient Gender: F              HR:           72 bpm. Exam Location:  Inpatient Procedure: 2D Echo, Transesophageal Echo, Cardiac Doppler and Color Doppler Indications:    Endocarditis  History:        Patient has prior history of Echocardiogram examinations, most                 recent 06/03/2022. Cardiomyopathy and CHF, CAD and Previous                 Myocardial Infarction, Prior CABG and Pacemaker, COPD, Mitral                 Valve Disease, Arrythmias:LBBB and Atrial Fibrillation,                 Signs/Symptoms:Murmur; Risk Factors:Diabetes, Hypertension,                 Dyslipidemia and Obesity.  Sonographer:    Eartha Inch Referring Phys: [place]: After discussion of the risks and benefits of a TEE, an informed consent was obtained from the patient. TEE procedure time was 11 minutes. The transesophogeal probe was passed without difficulty through the esophogus of the patient. Imaged were obtained with the patient in a left lateral decubitus position. Sedation performed by different physician. The patient was monitored while under deep sedation. Anesthestetic sedation was provided intravenously by Anesthesiology: 194.'32mg'$  of Propofol, '60mg'$  of Lidocaine. Image quality was good. The patient's vital signs; including heart rate, blood pressure, and oxygen saturation; remained stable throughout the procedure. The patient developed no complications during the procedure. IMPRESSIONS  1. Left ventricular ejection fraction, by estimation, is 30%. The left ventricle has moderate to severely decreased function. The left ventricle demonstrates global hypokinesis. The left ventricular internal cavity size was mildly dilated.  2. No vegetation noted on pacer wires in RA/RV. Right ventricular systolic function is mildly reduced. The right ventricular size is mildly enlarged. There is severely elevated pulmonary artery systolic pressure. The estimated right ventricular systolic  pressure is 95.2 mmHg.  3. The LA  appendage has been surgically ligated. Left atrial size was moderately dilated. No left atrial/left atrial appendage thrombus was detected.  4. Right atrial size was mildly dilated.  5. The posterior mitral leaflet was calcified and restricted, mild mitral regurgitation and no stenosis. No vegetation on the MV.  6. Trileaflet aortic valve. Lambl's excrescence was present but no vegetation. No regurgitation or stenosis.  7. There was a small, < 1 cm, vegetation on the tricuspid valve.. The tricuspid valve is abnormal. Tricuspid valve regurgitation is moderate to severe.  8. The inferior vena cava is dilated in size with <50% respiratory variability, suggesting right atrial pressure of 15 mmHg.  9. No PFO or ASD by color doppler. 10. Normal caliber thoracic aorta with grade 3 plaque in descending thoracic aorta. FINDINGS  Left Ventricle: Left ventricular ejection fraction, by estimation, is 30%. The left ventricle has moderate to severely decreased function. The left ventricle demonstrates global hypokinesis. The left ventricular internal cavity size was mildly dilated. There is no left ventricular hypertrophy. Right Ventricle: No vegetation noted on pacer wires in RA/RV. The right ventricular size is mildly enlarged. No increase in right ventricular wall thickness. Right ventricular systolic function is mildly reduced. There is severely elevated pulmonary artery systolic pressure. The tricuspid regurgitant velocity is 3.37 m/s, and with an assumed right atrial pressure of 15 mmHg, the estimated right ventricular systolic pressure is 84.1 mmHg. Left Atrium: The LA appendage has been surgically ligated. Left atrial size was moderately dilated. No left atrial/left atrial appendage thrombus was detected. Right Atrium: Right atrial size was mildly dilated. Pericardium: There is no evidence of pericardial effusion. Mitral Valve: The posterior mitral leaflet was calcified and restricted, mild mitral regurgitation and no  stenosis. No vegetation on the MV. The mitral valve is abnormal. Mild mitral valve regurgitation. Tricuspid Valve: There was a small, < 1 cm, vegetation on the tricuspid valve. The tricuspid valve is abnormal. Tricuspid valve regurgitation is moderate to severe. Aortic Valve: Trileaflet aortic valve. Lambl's excrescence was present but no vegetation. No regurgitation or stenosis. The aortic valve is tricuspid. Aortic valve regurgitation is not visualized. Pulmonic Valve: The pulmonic valve was normal in structure. Pulmonic valve regurgitation is not visualized. Aorta: Normal caliber thoracic aorta with grade 3 plaque in descending thoracic aorta. The aortic root is normal in size and structure. Venous:  The inferior vena cava is dilated in size with less than 50% respiratory variability, suggesting right atrial pressure of 15 mmHg. IAS/Shunts: No PFO or ASD by color doppler. Additional Comments: A device lead is visualized in the right ventricle.  IVC IVC diam: 2.50 cm TRICUSPID VALVE TR Peak grad:   45.4 mmHg TR Vmax:        337.00 cm/s [place] Electronically signed by Franki Monte Signature Date/Time: 06/06/2022/4:13:37 PM    Final    MR THORACIC SPINE WO CONTRAST  Result Date: 06/05/2022 CLINICAL DATA:  Back pain.  Concern for osteomyelitis. EXAM: MRI THORACIC SPINE WITHOUT CONTRAST TECHNIQUE: Multiplanar, multisequence MR imaging of the thoracic spine was performed. No intravenous contrast was administered. COMPARISON:  None Available. FINDINGS: Alignment: Grade 1 anterolisthesis of C7 on T1, T2 on T3. Mild retrolisthesis of T12 on L1. Vertebrae: There is T2/FLAIR hyperintense signal abnormality within the inferior and superior aspects of the T8 and T9 vertebral body levels (series 20, image 11), as well as the T8-T9 disc space. There is likely a small fluid collection along the left lateral aspect of the T8-T9 vertebral body level measuring approximately 1.9 x 1.3 cm (series 19, images 25-28).  There is no definite evidence of epidural abscess at this level, but assessment is markedly limited due to the lack of IV contrast and motion artifact. Cord: Assessment of the spinal cord is markedly limited due to the degree of motion artifact. Paraspinal and other soft tissues: Small bilateral pleural effusions. See above for description of paraspinal fluid collections at the T8-T9 level on the left. Disc levels: There are multilevel disc bulges without definite evidence of high-grade spinal canal stenosis. There is likely at least moderate neural foraminal stenosis at T8-T9 on the left. IMPRESSION: 1. Markedly limited exam due to the degree of motion artifact and lack of IV contrast. 2. Within this limitation, there are findings that are worrisome for discitis/osteomyelitis at T8-T9 with a small fluid collection along the left lateral aspect of the T8-T9 vertebral body level measuring approximately 1.9 x 1.3 cm. No definite evidence of epidural abscess, but assessment is markedly limited due to the degree of motion artifact. Recommend further evaluation with a contrast enhanced exam if the patient is able to limit movement at time of imaging. 3. Small bilateral pleural effusions Electronically Signed   By: Marin Roberts M.D.   On: 06/05/2022 16:00   ECHOCARDIOGRAM COMPLETE  Result Date: 06/03/2022    ECHOCARDIOGRAM REPORT   Patient Name:   Elaine [name] Date of Exam: 06/03/2022 Medical Rec #:  [id number]     Height:       62.0 in Accession #:    [id number]    Weight:       220.0 lb Date of Birth:  04/21/1941     BSA:          1.991 m Patient Age:    89 years      BP:           140/59 mmHg Patient Gender: F             HR:           73 bpm. Exam Location:  Inpatient Procedure: 2D Echo, Color Doppler, Cardiac Doppler and Intracardiac            Opacification Agent Indications:    CHF  History:        Patient has prior history of Echocardiogram examinations, most  recent 08/24/2021. CHF, CAD,  Arrythmias:Atrial Fibrillation and                 LBBB, Signs/Symptoms:Murmur; Risk Factors:Hypertension and                 Diabetes.  Sonographer:    Memory Argue Referring Phys: [id number] [place]  1. No left ventricular thrombus (Definity contrast used). Left ventricular ejection fraction, by estimation, is 25 to 30%. The left ventricle has severely decreased function. The left ventricle demonstrates global hypokinesis. The left ventricular internal cavity size was mildly dilated. Left ventricular diastolic function could not be evaluated. There is mild dyskinesis of the left ventricular, entire apical segment.  2. Right ventricular systolic function is moderately reduced. The right ventricular size is normal. There is moderately elevated pulmonary artery systolic pressure. The estimated right ventricular systolic pressure is 67.5 mmHg.  3. Left atrial size was moderately dilated.  4. Right atrial size was mildly dilated.  5. The mitral valve is normal in structure. Mild mitral valve regurgitation. Moderate mitral annular calcification.  6. The tricuspid valve is abnormal. Tricuspid valve regurgitation is moderate to severe.  7. The aortic valve is tricuspid. Aortic valve regurgitation is not visualized. Aortic valve sclerosis is present, with no evidence of aortic valve stenosis. Comparison(s): No significant change from prior study. Prior images reviewed side by side. FINDINGS  Left Ventricle: No left ventricular thrombus (Definity contrast used). Left ventricular ejection fraction, by estimation, is 25 to 30%. The left ventricle has severely decreased function. The left ventricle demonstrates global hypokinesis. Mild dyskinesis of the left ventricular, entire apical segment. The left ventricular internal cavity size was mildly dilated. There is no left ventricular hypertrophy. Abnormal (paradoxical) septal motion, consistent with RV pacemaker. Left ventricular diastolic function could not  be evaluated due to atrial fibrillation. Left ventricular diastolic function could not be evaluated.  LV Wall Scoring: The entire apex is dyskinetic. The mid anteroseptal segment is akinetic. The posterior wall, mid anterolateral segment, and mid inferior segment are hypokinetic. The anterior wall, basal anteroseptal segment, basal anterolateral segment, mid inferoseptal segment, basal inferior segment, and basal inferoseptal segment are normal. Right Ventricle: The right ventricular size is normal. No increase in right ventricular wall thickness. Right ventricular systolic function is moderately reduced. There is moderately elevated pulmonary artery systolic pressure. The tricuspid regurgitant velocity is 3.73 m/s, and with an assumed right atrial pressure of 3 mmHg, the estimated right ventricular systolic pressure is 44.9 mmHg. Left Atrium: Left atrial size was moderately dilated. Right Atrium: Right atrial size was mildly dilated. Pericardium: There is no evidence of pericardial effusion. Mitral Valve: The mitral valve is normal in structure. Moderate mitral annular calcification. Mild mitral valve regurgitation. Tricuspid Valve: The tricuspid valve is abnormal. Tricuspid valve regurgitation is moderate to severe. Aortic Valve: The aortic valve is tricuspid. Aortic valve regurgitation is not visualized. Aortic valve sclerosis is present, with no evidence of aortic valve stenosis. Aortic valve mean gradient measures 8.0 mmHg. Aortic valve peak gradient measures 15.2 mmHg. Aortic valve area, by VTI measures 1.06 cm. Pulmonic Valve: The pulmonic valve was normal in structure. Pulmonic valve regurgitation is not visualized. Aorta: The aortic root and ascending aorta are structurally normal, with no evidence of dilitation. IAS/Shunts: No atrial level shunt detected by color flow Doppler. Additional Comments: A device lead is visualized in the right ventricle.  LEFT VENTRICLE PLAX 2D LVIDd:         5.60 cm  Diastology LVIDs:         4.80 cm   LV e' medial:    4.97 cm/s LV PW:         1.10 cm   LV E/e' medial:  27.0 LV IVS:        1.00 cm   LV e' lateral:   8.55 cm/s LVOT diam:     2.10 cm   LV E/e' lateral: 15.7 LV SV:         40 LV SV Index:   20 LVOT Area:     3.46 cm  RIGHT VENTRICLE RV S prime:     7.93 cm/s LEFT ATRIUM             Index        RIGHT ATRIUM           Index LA diam:        4.20 cm 2.11 cm/m   RA Area:     17.60 cm LA Vol (A2C):   80.8 ml 40.59 ml/m  RA Volume:   43.80 ml  22.00 ml/m LA Vol (A4C):   88.2 ml 44.31 ml/m LA Biplane Vol: 84.8 ml 42.60 ml/m  AORTIC VALVE AV Area (Vmax):    1.16 cm AV Area (Vmean):   1.10 cm AV Area (VTI):     1.06 cm AV Vmax:           195.00 cm/s AV Vmean:          129.000 cm/s AV VTI:            0.375 m AV Peak Grad:      15.2 mmHg AV Mean Grad:      8.0 mmHg LVOT Vmax:         65.40 cm/s LVOT Vmean:        41.100 cm/s LVOT VTI:          0.115 m LVOT/AV VTI ratio: 0.31  AORTA Ao Root diam: 3.10 cm MITRAL VALVE                TRICUSPID VALVE MV Area (PHT): 5.84 cm     TR Peak grad:   55.7 mmHg MV Decel Time: 130 msec     TR Vmax:        373.00 cm/s MR Peak grad: 80.3 mmHg MR Vmax:      448.00 cm/s   SHUNTS MV E velocity: 134.00 cm/s  Systemic VTI:  0.12 m MV A velocity: 62.80 cm/s   Systemic Diam: 2.10 cm MV E/A ratio:  2.13 Mihai Croitoru MD Electronically signed by Sanda Klein MD Signature Date/Time: 06/03/2022/1:04:15 PM    Final    CT ABDOMEN PELVIS WO CONTRAST  Result Date: 06/03/2022 CLINICAL DATA:  81 year old female with left lower quadrant abdominal pain. History of recurrent UTI and sepsis. EXAM: CT ABDOMEN AND PELVIS WITHOUT CONTRAST TECHNIQUE: Multidetector CT imaging of the abdomen and pelvis was performed following the standard protocol without IV contrast. RADIATION DOSE REDUCTION: This exam was performed according to the departmental dose-optimization program which includes automated exposure control, adjustment of the mA and/or kV  according to patient size and/or use of iterative reconstruction technique. COMPARISON:  Noncontrast CT Abdomen and Pelvis 05/19/2022. FINDINGS: Lower chest: Prior sternotomy. Cardiac pacemaker leads. Mild cardiomegaly. No pericardial effusion. Increased bilateral lung base opacity since last month appears related to new trace pleural effusions and patchy bilateral lower lobe opacity with borderline consolidation. The lung base involvement is fairly symmetric. Hepatobiliary: Stable  noncontrast liver and gallbladder. Pancreas: Stable, negative. Spleen: Stable, negative. Adrenals/Urinary Tract: Normal adrenal glands. Noncontrast kidneys are stable and nonobstructed. No hydronephrosis or pararenal inflammation. No convincing nephrolithiasis. No hydroureter. Urinary bladder partially obscured by hip arthroplasty streak artifact, but evidence of indistinct bladder wall inflammation at the dome which is visible on series 3, image 64. Stomach/Bowel: Extensive large bowel diverticulosis from the transverse colon distally. The the hepatic flexure and mid transverse colon are more gas distended today. But there is no transition point, with retained gas and stool in redundant distal transverse and splenic flexure. Descending colon is decompressed. Rectosigmoid colon decompressed. Extensive diverticula but no definite active large bowel inflammation. Cecum on a lax mesentery. Diverticulosis of the terminal ileum (series 3, image 41) which is decompressed. Cecum is gas distended similar to the mid transverse colon. No cecal wall thickening. Appendix not identified. No pericecal inflammation. No free air or free fluid identified. Gas-filled but nondilated distal small bowel in the right abdomen. A small fat containing umbilical hernia appears stable and inconsequential. Negative stomach. Duodenum is decompressed. Vascular/Lymphatic: Extensive Aortoiliac calcified atherosclerosis. Vascular patency is not evaluated in the absence  of IV contrast. Normal caliber abdominal aorta. No lymphadenopathy identified. Reproductive: Uterus seems to be surgically absent. Ovaries are not identified. Other: Limited visualization of the pelvis from hip arthroplasty streak artifact as before. Musculoskeletal: Chronic spine degeneration with lumbosacral junction ankylosis and osteopenia. Chronic bilateral hip arthroplasty with subsequent streak artifact in the pelvis. Prior sternotomy. No acute osseous abnormality identified. IMPRESSION: 1. Increased lung base opacification since last month appears related to trace new pleural effusions and patchy opacity. Symmetry argues in favor of atelectasis, but acute lung base infection cannot be excluded. 2. Limited CT visualization of the pelvis secondary to artifact from bilateral hip arthroplasties. But the visible urinary bladder dome appears inflamed compatible with UTI and/or Cystitis. 3. No evidence of obstructive uropathy or ascending urinary infection on this noncontrast exam. 4. Widespread severe large bowel diverticulosis, and some distal small bowel diverticulosis also, but no active inflammation identified. Increased gaseous distension of the proximal large bowel might reflect Ileus. 5. Severe Aortic Atherosclerosis (ICD10-I70.0). Electronically Signed   By: Genevie Ann M.D.   On: 06/03/2022 04:49   DG Chest Port 1 View  Result Date: 06/03/2022 CLINICAL DATA:  Back pain.  No trauma. EXAM: PORTABLE CHEST 1 VIEW COMPARISON:  05/19/2022 FINDINGS: Cardiac pacemaker. Postoperative changes in the mediastinum. Cardiac enlargement. Mild vascular congestion. Mild interstitial changes suggesting early edema. Small left pleural effusion. No pneumothorax. Calcification of the aorta. IMPRESSION: Cardiac enlargement with mild vascular congestion and edema. Small left pleural effusion. Electronically Signed   By: Lucienne Capers M.D.   On: 06/03/2022 02:13   DG Lumbar Spine 2-3 Views  Result Date:  06/03/2022 CLINICAL DATA:  Back pain EXAM: LUMBAR SPINE - 2-3 VIEW COMPARISON:  CT abdomen and pelvis 05/19/2022 FINDINGS: Five lumbar type vertebral bodies. Normal alignment. No acute vertebral compression deformities. No focal bone lesion or bone destruction. Degenerative changes with disc space narrowing and endplate osteophyte formation. Multilevel degenerative disc disease. Degenerative changes in the facet joints. Prominent vascular calcification in the aorta. Bilateral hip arthroplasties. IMPRESSION: Normal alignment. Degenerative changes. No acute displaced fractures identified. Electronically Signed   By: Lucienne Capers M.D.   On: 06/03/2022 02:12   DG Thoracic Spine 2 View  Result Date: 06/03/2022 CLINICAL DATA:  Back pain.  No trauma. EXAM: THORACIC SPINE 2 VIEWS COMPARISON:  Two-view chest 07/29/2019 FINDINGS: Normal alignment  of the thoracic spine. Mild vertebral wedge deformities of the midthoracic region similar to prior study. No new vertebral compression is identified. Degenerative changes with narrowed disc spaces and endplate osteophyte formation throughout. No focal bone lesion or bone destruction. Bone cortex appears intact. No abnormal paraspinal soft tissue swelling. Mild thoracic scoliosis convex towards the right is unchanged. Postoperative changes noted in the mediastinum. IMPRESSION: Normal alignment of the thoracic spine. No acute displaced fractures identified. Degenerative changes. Electronically Signed   By: Lucienne Capers M.D.   On: 06/03/2022 02:10   DG Chest Port 1 View  Result Date: 05/19/2022 CLINICAL DATA:  Fever, weakness and fatigue. EXAM: PORTABLE CHEST 1 VIEW COMPARISON:  07/29/2019 FINDINGS: Stable cardiac enlargement, radiographic appearance biventricular pacer/ICD and evidence of prior CABG. Low lung volumes bilaterally. No overt edema, airspace consolidation or pneumothorax. Stable chronic bilateral pleural thickening. IMPRESSION: Stable cardiac enlargement.  Electronically Signed   By: Aletta Edouard M.D.   On: 05/19/2022 15:59   CT ABDOMEN PELVIS WO CONTRAST  Result Date: 05/19/2022 CLINICAL DATA:  Recurrent urinary tract infections and sepsis. Fatigue and weakness. EXAM: CT ABDOMEN AND PELVIS WITHOUT CONTRAST TECHNIQUE: Multidetector CT imaging of the abdomen and pelvis was performed following the standard protocol without IV contrast. RADIATION DOSE REDUCTION: This exam was performed according to the departmental dose-optimization program which includes automated exposure control, adjustment of the mA and/or kV according to patient size and/or use of iterative reconstruction technique. COMPARISON:  None Available. FINDINGS: Lower chest: No acute findings. Hepatobiliary: No mass visualized on this unenhanced exam. Gallbladder is unremarkable. No evidence of biliary ductal dilatation. Pancreas: No mass or inflammatory process visualized on this unenhanced exam. Spleen:  Within normal limits in size. Adrenals/Urinary tract: No evidence of urolithiasis or hydronephrosis. Distal ureters and bladder are not well visualized due to severe artifact from bilateral hip prostheses. Stomach/Bowel: No evidence of obstruction, inflammatory process, or abnormal fluid collections. Severe diverticulosis is seen in the transverse, descending, and sigmoid colon, however there is no evidence of diverticulitis. Vascular/Lymphatic: No pathologically enlarged lymph nodes identified. No evidence of abdominal aortic aneurysm. Aortic atherosclerotic calcification incidentally noted. Reproductive: Obscured by severe artifact through the lower pelvis from bilateral hip prostheses. Other:  None. Musculoskeletal:  No suspicious bone lesions identified. IMPRESSION: No evidence of urolithiasis, hydronephrosis, or other acute findings. Severe colonic diverticulosis, without radiographic evidence of diverticulitis. Electronically Signed   By: Marlaine Hind M.D.   On: 05/19/2022 15:14    I spent  60  minutes for this patient encounter including review of prior medical records, coordination of care with primary/other specialist with greater than 50% of time being face to face/counseling and discussing diagnostics/treatment plan with the patient/family.  Electronically signed by:   Rosiland Oz, MD Infectious Disease Physician [facility] Pager: [phone]

## 2022-06-09 DIAGNOSIS — I5023 Acute on chronic systolic (congestive) heart failure: Secondary | ICD-10-CM | POA: Diagnosis not present

## 2022-06-09 DIAGNOSIS — Z87891 Personal history of nicotine dependence: Secondary | ICD-10-CM | POA: Diagnosis not present

## 2022-06-09 LAB — CULTURE, BLOOD (ROUTINE X 2)
Culture: NO GROWTH
Culture: NO GROWTH

## 2022-06-09 LAB — GLUCOSE, CAPILLARY
Glucose-Capillary: 118 mg/dL — ABNORMAL HIGH (ref 70–99)
Glucose-Capillary: 126 mg/dL — ABNORMAL HIGH (ref 70–99)
Glucose-Capillary: 175 mg/dL — ABNORMAL HIGH (ref 70–99)
Glucose-Capillary: 199 mg/dL — ABNORMAL HIGH (ref 70–99)
Glucose-Capillary: 205 mg/dL — ABNORMAL HIGH (ref 70–99)
Glucose-Capillary: 263 mg/dL — ABNORMAL HIGH (ref 70–99)

## 2022-06-09 LAB — BASIC METABOLIC PANEL
Anion gap: 11 (ref 5–15)
BUN: 57 mg/dL — ABNORMAL HIGH (ref 8–23)
CO2: 22 mmol/L (ref 22–32)
Calcium: 8.9 mg/dL (ref 8.9–10.3)
Chloride: 102 mmol/L (ref 98–111)
Creatinine, Ser: 2.39 mg/dL — ABNORMAL HIGH (ref 0.44–1.00)
GFR, Estimated: 20 mL/min — ABNORMAL LOW (ref >60)
Glucose, Bld: 167 mg/dL — ABNORMAL HIGH (ref 70–99)
Potassium: 4 mmol/L (ref 3.5–5.1)
Sodium: 135 mmol/L (ref 135–145)

## 2022-06-09 MED ORDER — FUROSEMIDE 10 MG/ML IJ SOLN
20.0000 mg | Freq: Once | INTRAMUSCULAR | Status: AC
  Administered 2022-06-09: 20 mg via INTRAVENOUS
  Filled 2022-06-09: qty 2

## 2022-06-09 MED ORDER — TORSEMIDE 20 MG PO TABS: 60.0000 mg | ORAL_TABLET | Freq: Every day | ORAL | Status: DC

## 2022-06-09 MED ORDER — METOPROLOL SUCCINATE ER 50 MG PO TB24: 50.0000 mg | ORAL_TABLET | Freq: Two times a day (BID) | ORAL | 0 refills | Status: DC

## 2022-06-09 MED ORDER — CHLORHEXIDINE GLUCONATE CLOTH 2 % EX PADS
6.0000 | MEDICATED_PAD | Freq: Every day | CUTANEOUS | Status: DC
  Administered 2022-06-09 – 2022-06-10 (×2): 6 via TOPICAL

## 2022-06-09 MED ORDER — TORSEMIDE 20 MG PO TABS
60.0000 mg | ORAL_TABLET | Freq: Every day | ORAL | Status: DC
  Administered 2022-06-09 – 2022-06-10 (×2): 60 mg via ORAL
  Filled 2022-06-09 (×2): qty 3

## 2022-06-09 MED ORDER — SODIUM CHLORIDE 0.9% FLUSH: 10.0000 mL | INTRAVENOUS | Status: DC | PRN

## 2022-06-09 MED ORDER — ORAL CARE MOUTH RINSE: 15.0000 mL | OROMUCOSAL | Status: DC | PRN

## 2022-06-09 MED ORDER — HYDROCODONE-ACETAMINOPHEN 5-325 MG PO TABS: 1.0000 | ORAL_TABLET | Freq: Four times a day (QID) | ORAL | 0 refills | Status: AC | PRN

## 2022-06-09 MED ORDER — SODIUM CHLORIDE 0.9% FLUSH
10.0000 mL | Freq: Two times a day (BID) | INTRAVENOUS | Status: DC
  Administered 2022-06-09 – 2022-06-10 (×2): 10 mL

## 2022-06-09 MED ORDER — CEFTRIAXONE IV (FOR PTA / DISCHARGE USE ONLY): 2.0000 g | Freq: Two times a day (BID) | INTRAVENOUS | 0 refills | Status: AC

## 2022-06-09 MED ORDER — AMPICILLIN IV (FOR PTA / DISCHARGE USE ONLY): 2.0000 g | Freq: Three times a day (TID) | INTRAVENOUS | 0 refills | Status: AC

## 2022-06-09 NOTE — Care Management Important Message (Signed)
Important Message  Patient Details  Name: Elaine [name] MRN: [id number] Date of Birth: [birth year]   Medicare Important Message Given:  Yes     Shelda Altes 06/09/2022, 12:22 PM

## 2022-06-09 NOTE — Progress Notes (Signed)
OPAT  Diagnosis: Native TV endocarditis/vertebral osteomyelitis   Culture Result: E faecalis   Allergies  Allergen Reactions   Crestor [Rosuvastatin Calcium] Other (See Comments)    Cramps- still taking    Allopurinol Hives   Cephalosporins Rash   Tape Rash    Adhesive tape    OPAT Orders Discharge antibiotics to be given via PICC line Discharge antibiotics: Ampicillin IV and ceftriaxone IV Per pharmacy protocol  Duration: 6 weeks  End Date: 07/16/22  [facility] Per Protocol:  Home health RN for IV administration and teaching; PICC line care and labs.    Labs weekly while on IV antibiotics: X__ CBC with differential __ BMP X__ CMP __ CRP __ ESR __ Vancomycin trough __ CK  __ Please pull PIC at completion of IV antibiotics X__ Please leave PIC in place until doctor has seen patient or been notified  Fax weekly labs to [phone]  Clinic Follow Up Appt: 4 weeks  with Dr Gale Journey  .Rosiland Oz, MD Infectious Disease Physician [facility] [address] [place],  [address] Phone: [phone]  Fax: [phone]

## 2022-06-09 NOTE — Discharge Summary (Incomplete)
Physician Discharge Summary  Elaine [name] EGB:[id number] DOB: [birth year] DOA: 06/02/2022  PCP: Elaine [name]., MD  Admit date: 06/02/2022 Discharge date: 06/10/2022  Time spent: 45 minutes  Recommendations for Outpatient Follow-up:  Infectious disease Dr.Vu in 1 month Continue IV ampicillin and ceftriaxone till 11/19 via PICC line CHF team Dr. Aundra Dubin in 2 weeks BMP in 1 week   Discharge Diagnoses:  Principal Problem:   Acute on chronic systolic CHF (congestive heart failure) (HCC) T8, T9 discitis/osteomyelitis   Endocarditis of tricuspid valve   Permanent atrial fibrillation (HCC) - CHA2DS2-VASc Score 6, on Eliquis   Acute kidney injury superimposed on chronic kidney disease (HCC)   CAD status post CABG   Essential hypertension   Type 2 diabetes mellitus with hyperlipidemia (HCC)   Chronic respiratory failure with hypoxia (HCC)   Hypothyroidism   Class 3 obesity ([place])   Bacteremia   Discharge Condition: Improved  Diet recommendation: Dose of DM, heart healthy  Filed Weights   06/08/22 0610 06/09/22 0556 06/10/22 3710  Weight: 103.8 kg 104.4 kg 106.5 kg    History of present illness:  81/F with history of chronic systolic CHF, chronic hypoxic respiratory failure on home O2, COPD, paroxysmal A-fib, CAD presented to the ED with worsening dyspnea, edema, dysuria and back pain. Chest x-ray noted small left pleural effusion, CT abdomen pelvis with inflammation at the urinary bladder. -Admitted, started on diuretics -She was then noted to have positive blood cultures with Enterococcus -Infectious disease was consulted, subsequently underwent a TEE which noted tricuspid valve vegetation consistent with endocarditis -MRI positive for discitis, osteomyelitis at T8, T9, no epidural abscess  Hospital Course:   Acute on chronic systolic CHF  -Echo EF 25 to 30%, Moderate reduction in RV systolic function, Moderate to severe TR  -Known ischemic cardiomyopathy -Diuresed with  IV Lasix, switched to oral torsemide, per CHF team -Also remains on BiDil, Toprol -Intolerant to SGLT2 and Aldactone -PT OT consulted, SNF recommended -Follow-up in the CHF clinic   Endocarditis of tricuspid valve T8, T9 discitis/osteomyelitis Enterococcus faecalis bacteremia and UTI -TEE positive for small vegetation on tricuspid valve, did not appear to involve device leads, cards to make EP aware -Appreciate infectious disease input -Remains on IV ampicillin and ceftriaxone -Seen by interventional radiology Dr. Colon Flattery, T-spine aspiration/biopsy was felt to be risky with rib outweighing benefits, antibiotic therapy was recommended -Cards discussed pacer wires and RV with EP team, felt to be a poor candidate for device extraction with severity of CHF and device dependence -Repeat blood cultures from 10/8 negative X 5 days -PICC line placed today, infectious disease recommended to continue ampicillin and ceftriaxone until 11/19, follow-up in infectious disease clinic in 1 month   Permanent atrial fibrillation - CHA2DS2-VASc Score 6, on Eliquis -Continue Toprol and apixaban, off amiodarone   AKI on CKD stage IV -Creatinine now stable at baseline of 2.1-2.5 -Stable monitor on diuretics  Hypokalemia -replaced, given 8mq today   CAD status post CABG Patient with no chest pain.    Essential hypertension -Stable continue Toprol and BiDil   Type 2 diabetes mellitus with hyperlipidemia (HCC) -Stable continue SSI   Chronic respiratory failure with hypoxia (HCC) COPD -Stable, continue current inhalers   Hypothyroidism Continue with levothyroxine    Class 3 obesity (HCC) Calculated BMI 40,2   Consultants: Infectious disease and CHF team  Discharge Exam: Vitals:   06/10/22 0638 06/10/22 0728  BP: (!) 106/41 (!) 116/57  Pulse: 73   Resp: 16  Temp: 97.9 F (36.6 C)   SpO2:     General exam: Pleasant elderly female laying in bed, AAOx3 HEENT: No JVD CVS: S1-S2,  regular rhythm, systolic murmur Lungs: Clear anteriorly Abdomen: Soft, nontender, bowel sounds present Extremities: Trace edema  Neuro: Moves all extremities, no localizing signs  Discharge Instructions   Discharge Instructions     [facility] pharmacist to adjust dose for Vancomycin, Aminoglycosides and other anti-infective therapies as requested by physician.   Complete by: As directed    [facility] to provide Cath Flo 79m   Complete by: As directed    Administer for PICC line occlusion and as ordered by physician for other access device issues.   Anaphylaxis Kit: Provided to treat any anaphylactic reaction to the medication being provided to the patient if First Dose or when requested by physician   Complete by: As directed    Epinephrine 151mml vial / amp: Administer 0.13m113m0.13ml76mubcutaneously once for moderate to severe anaphylaxis, nurse to call physician and pharmacy when reaction occurs and call 911 if needed for immediate care   Diphenhydramine 50mg61mIV vial: Administer 25-50mg 40mM PRN for first dose reaction, rash, itching, mild reaction, nurse to call physician and pharmacy when reaction occurs   Sodium Chloride 0.9% NS 500ml I81mdminister if needed for hypovolemic blood pressure drop or as ordered by physician after call to physician with anaphylactic reaction   Change dressing on IV access line weekly and PRN   Complete by: As directed    Flush IV access with Sodium Chloride 0.9% and Heparin 10 units/ml or 100 units/ml   Complete by: As directed    Home infusion instructions - [facility]   Complete by: As directed    Instructions: Flush IV access with Sodium Chloride 0.9% and Heparin 10units/ml or 100units/ml   Change dressing on IV access line: Weekly and PRN   Instructions Cath Flo 2mg: Ad61mister for PICC Line occlusion and as ordered by physician for other access device   [facility] pharmacist to adjust dose for:  Vancomycin, Aminoglycosides and other anti-infective therapies as requested by physician   Method of administration may be changed at the discretion of home infusion pharmacist based upon assessment of the patient and/or caregiver's ability to self-administer the medication ordered   Complete by: As directed       Allergies as of 06/10/2022       Reactions   Crestor [rosuvastatin Calcium] Other (See Comments)   Cramps- still taking    Allopurinol Hives   Cephalosporins Rash   Tape Rash   Adhesive tape        Medication List     STOP taking these medications    metolazone 2.5 MG tablet Commonly known as: ZAROXOLYN   nitrofurantoin (macrocrystal-monohydrate) 100 MG capsule Commonly known as: MACROBID       TAKE these medications    acetaminophen 650 MG CR tablet Commonly known as: TYLENOL Take 1,300 mg by mouth 2 (two) times daily.   albuterol 108 (90 Base) MCG/ACT inhaler Commonly known as: VENTOLIN HFA Inhale 2 puffs into the lungs every 6 (six) hours as needed for wheezing or shortness of breath.   ampicillin  IVPB Inject 2 g into the vein every 8 (eight) hours. Indication:  Enterococcus faecalis IE  First Dose: Yes Last Day of Therapy:  07/16/2022 Labs - Once weekly:  CBC/D and BMP, Labs - Every other week:  ESR and CRP Method of administration: Ambulatory  Pump (Continuous Infusion) Method of administration may be changed at the discretion of home infusion pharmacist based upon assessment of the patient and/or caregiver's ability to self-administer the medication ordered.   Anoro Ellipta 62.5-25 MCG/ACT Aepb Generic drug: umeclidinium-vilanterol Inhale 1 puff into the lungs daily.   apixaban 2.5 MG Tabs tablet Commonly known as: ELIQUIS Take 2.5 mg by mouth 2 (two) times daily.   cefTRIAXone  IVPB Commonly known as: ROCEPHIN Inject 2 g into the vein every 12 (twelve) hours. Indication:  Enterococcus faecalis IE  First Dose: Yes Last Day of Therapy:   07/16/2022 Labs - Once weekly:  CBC/D and BMP, Labs - Every other week:  ESR and CRP Method of administration: IV Push Method of administration may be changed at the discretion of home infusion pharmacist based upon assessment of the patient and/or caregiver's ability to self-administer the medication ordered.   colchicine 0.6 MG tablet Take 0.6 mg by mouth daily as needed (Gout).   CoQ-10 100 MG Caps Take 100 mg by mouth in the morning and at bedtime.   Cranberry 250 MG Tabs Take 500 mg by mouth 2 (two) times daily.   ELDERBERRY PO Take 1 tablet by mouth 2 (two) times daily.   ezetimibe 10 MG tablet Commonly known as: ZETIA Take 10 mg by mouth daily.   ferrous sulfate 325 (65 FE) MG tablet Take 325 mg by mouth every other day.   HYDROcodone-acetaminophen 5-325 MG tablet Commonly known as: NORCO/VICODIN Take 1 tablet by mouth every 6 (six) hours as needed for up to 5 days for moderate pain.   isosorbide-hydrALAZINE 20-37.5 MG tablet Commonly known as: BIDIL Take 0.5 tablets by mouth 3 (three) times daily.   levothyroxine 112 MCG tablet Commonly known as: Synthroid Take 1 tablet (112 mcg total) by mouth daily before breakfast.   metoprolol succinate 50 MG 24 hr tablet Commonly known as: TOPROL-XL Take 1 tablet (50 mg total) by mouth 2 (two) times daily. Take with or immediately following a meal. What changed:  medication strength See the new instructions.   potassium chloride SA 20 MEQ tablet Commonly known as: KLOR-CON M Take 1 tablet (20 mEq total) by mouth daily. What changed: when to take this   PreserVision AREDS 2 Caps Take 1 capsule by mouth 2 (two) times daily.   MULTI FOR HER 50+ PO Take 1 tablet by mouth daily.   rosuvastatin 5 MG tablet Commonly known as: CRESTOR Take 5 mg by mouth every other day.   torsemide 20 MG tablet Commonly known as: DEMADEX Take 3 tablets (60 mg total) by mouth daily.   TRULICITY [place] Inject 1.5 mg into the skin once a  week. Tuesday               Discharge Care Instructions  (From admission, onward)           Start     Ordered   06/09/22 0000  Change dressing on IV access line weekly and PRN  (Home infusion instructions - [facility] )        06/09/22 1150           Allergies  Allergen Reactions   Crestor [Rosuvastatin Calcium] Other (See Comments)    Cramps- still taking    Allopurinol Hives   Cephalosporins Rash   Tape Rash    Adhesive tape    Contact information for follow-up providers     [place] Follow up on  06/22/2022.   Specialty: Cardiology Why: [facility] 10:30 am Entrance C, Free Valet Parking Please bring med list to appointment Contact information: [address] [id number] [place] [phone]             Contact information for after-discharge care     Destination     [facility] Preferred SNF .   Service: Skilled Nursing Contact information: [address] [place] [phone]                      The results of significant diagnostics from this hospitalization (including imaging, microbiology, ancillary and laboratory) are listed below for reference.    Significant Diagnostic Studies: [place] EKG SITE RITE  Result Date: 06/08/2022 If Site Rite image not attached, placement could not be confirmed due to current cardiac rhythm.  CUP PACEART REMOTE DEVICE CHECK  Result Date: 06/07/2022 Scheduled remote reviewed. Normal device function.  corvue with downward trend x16 days, followed by ICM Next remote 91 days. LA  ECHO TEE  Result Date: 06/06/2022    TRANSESOPHOGEAL ECHO REPORT   Patient Name:   KALIKA SMAY Date of Exam: 06/06/2022 Medical Rec #:  [id number]     Height:       62.0 in Accession #:    [id number]    Weight:       226.4 lb Date of Birth:  [birth year]     BSA:           2.015 m Patient Age:    81 years      BP:           97/61 mmHg Patient Gender: F             HR:           72 bpm. Exam Location:  Inpatient Procedure: 2D Echo, Transesophageal Echo, Cardiac Doppler and Color Doppler Indications:    Endocarditis  History:        Patient has prior history of Echocardiogram examinations, most                 recent 06/03/2022. Cardiomyopathy and CHF, CAD and Previous                 Myocardial Infarction, Prior CABG and Pacemaker, COPD, Mitral                 Valve Disease, Arrythmias:LBBB and Atrial Fibrillation,                 Signs/Symptoms:Murmur; Risk Factors:Diabetes, Hypertension,                 Dyslipidemia and Obesity.  Sonographer:    Eartha Inch Referring Phys: [place]: After discussion of the risks and benefits of a TEE, an informed consent was obtained from the patient. TEE procedure time was 11 minutes. The transesophogeal probe was passed without difficulty through the esophogus of the patient. Imaged were obtained with the patient in a left lateral decubitus position. Sedation performed by different physician. The patient was monitored while under deep sedation. Anesthestetic sedation was provided intravenously by Anesthesiology: 194.52m of Propofol, 674mof Lidocaine. Image quality was good. The patient's vital signs; including heart rate, blood pressure, and oxygen saturation; remained stable throughout the procedure. The patient developed no complications during the procedure. IMPRESSIONS  1. Left ventricular ejection fraction, by estimation, is 30%. The left ventricle has moderate to  severely decreased function. The left ventricle demonstrates global hypokinesis. The left ventricular internal cavity size was mildly dilated.  2. No vegetation noted on pacer wires in RA/RV. Right ventricular systolic function is mildly reduced. The right ventricular size is mildly enlarged. There is severely elevated pulmonary artery systolic pressure.  The estimated right ventricular systolic  pressure is 97.0 mmHg.  3. The LA appendage has been surgically ligated. Left atrial size was moderately dilated. No left atrial/left atrial appendage thrombus was detected.  4. Right atrial size was mildly dilated.  5. The posterior mitral leaflet was calcified and restricted, mild mitral regurgitation and no stenosis. No vegetation on the MV.  6. Trileaflet aortic valve. Lambl's excrescence was present but no vegetation. No regurgitation or stenosis.  7. There was a small, < 1 cm, vegetation on the tricuspid valve.. The tricuspid valve is abnormal. Tricuspid valve regurgitation is moderate to severe.  8. The inferior vena cava is dilated in size with <50% respiratory variability, suggesting right atrial pressure of 15 mmHg.  9. No PFO or ASD by color doppler. 10. Normal caliber thoracic aorta with grade 3 plaque in descending thoracic aorta. FINDINGS  Left Ventricle: Left ventricular ejection fraction, by estimation, is 30%. The left ventricle has moderate to severely decreased function. The left ventricle demonstrates global hypokinesis. The left ventricular internal cavity size was mildly dilated. There is no left ventricular hypertrophy. Right Ventricle: No vegetation noted on pacer wires in RA/RV. The right ventricular size is mildly enlarged. No increase in right ventricular wall thickness. Right ventricular systolic function is mildly reduced. There is severely elevated pulmonary artery systolic pressure. The tricuspid regurgitant velocity is 3.37 m/s, and with an assumed right atrial pressure of 15 mmHg, the estimated right ventricular systolic pressure is 26.3 mmHg. Left Atrium: The LA appendage has been surgically ligated. Left atrial size was moderately dilated. No left atrial/left atrial appendage thrombus was detected. Right Atrium: Right atrial size was mildly dilated. Pericardium: There is no evidence of pericardial effusion. Mitral Valve: The posterior  mitral leaflet was calcified and restricted, mild mitral regurgitation and no stenosis. No vegetation on the MV. The mitral valve is abnormal. Mild mitral valve regurgitation. Tricuspid Valve: There was a small, < 1 cm, vegetation on the tricuspid valve. The tricuspid valve is abnormal. Tricuspid valve regurgitation is moderate to severe. Aortic Valve: Trileaflet aortic valve. Lambl's excrescence was present but no vegetation. No regurgitation or stenosis. The aortic valve is tricuspid. Aortic valve regurgitation is not visualized. Pulmonic Valve: The pulmonic valve was normal in structure. Pulmonic valve regurgitation is not visualized. Aorta: Normal caliber thoracic aorta with grade 3 plaque in descending thoracic aorta. The aortic root is normal in size and structure. Venous: The inferior vena cava is dilated in size with less than 50% respiratory variability, suggesting right atrial pressure of 15 mmHg. IAS/Shunts: No PFO or ASD by color doppler. Additional Comments: A device lead is visualized in the right ventricle.  IVC IVC diam: 2.50 cm TRICUSPID VALVE TR Peak grad:   45.4 mmHg TR Vmax:        337.00 cm/s [place] Electronically signed by Franki Monte Signature Date/Time: 06/06/2022/4:13:37 PM    Final    MR THORACIC SPINE WO CONTRAST  Result Date: 06/05/2022 CLINICAL DATA:  Back pain.  Concern for osteomyelitis. EXAM: MRI THORACIC SPINE WITHOUT CONTRAST TECHNIQUE: Multiplanar, multisequence MR imaging of the thoracic spine was performed. No intravenous contrast was administered. COMPARISON:  None Available. FINDINGS: Alignment: Grade 1 anterolisthesis of  C7 on T1, T2 on T3. Mild retrolisthesis of T12 on L1. Vertebrae: There is T2/FLAIR hyperintense signal abnormality within the inferior and superior aspects of the T8 and T9 vertebral body levels (series 20, image 11), as well as the T8-T9 disc space. There is likely a small fluid collection along the left lateral aspect of the T8-T9 vertebral  body level measuring approximately 1.9 x 1.3 cm (series 19, images 25-28). There is no definite evidence of epidural abscess at this level, but assessment is markedly limited due to the lack of IV contrast and motion artifact. Cord: Assessment of the spinal cord is markedly limited due to the degree of motion artifact. Paraspinal and other soft tissues: Small bilateral pleural effusions. See above for description of paraspinal fluid collections at the T8-T9 level on the left. Disc levels: There are multilevel disc bulges without definite evidence of high-grade spinal canal stenosis. There is likely at least moderate neural foraminal stenosis at T8-T9 on the left. IMPRESSION: 1. Markedly limited exam due to the degree of motion artifact and lack of IV contrast. 2. Within this limitation, there are findings that are worrisome for discitis/osteomyelitis at T8-T9 with a small fluid collection along the left lateral aspect of the T8-T9 vertebral body level measuring approximately 1.9 x 1.3 cm. No definite evidence of epidural abscess, but assessment is markedly limited due to the degree of motion artifact. Recommend further evaluation with a contrast enhanced exam if the patient is able to limit movement at time of imaging. 3. Small bilateral pleural effusions Electronically Signed   By: Marin Roberts M.D.   On: 06/05/2022 16:00   ECHOCARDIOGRAM COMPLETE  Result Date: 06/03/2022    ECHOCARDIOGRAM REPORT   Patient Name:   Elaine [name] Date of Exam: 06/03/2022 Medical Rec #:  [id number]     Height:       62.0 in Accession #:    [id number]    Weight:       220.0 lb Date of Birth:  1941-08-21     BSA:          1.991 m Patient Age:    81 years      BP:           140/59 mmHg Patient Gender: F             HR:           73 bpm. Exam Location:  Inpatient Procedure: 2D Echo, Color Doppler, Cardiac Doppler and Intracardiac            Opacification Agent Indications:    CHF  History:        Patient has prior history of  Echocardiogram examinations, most                 recent 08/24/2021. CHF, CAD, Arrythmias:Atrial Fibrillation and                 LBBB, Signs/Symptoms:Murmur; Risk Factors:Hypertension and                 Diabetes.  Sonographer:    Memory Argue Referring Phys: [id number] [place]  1. No left ventricular thrombus (Definity contrast used). Left ventricular ejection fraction, by estimation, is 25 to 30%. The left ventricle has severely decreased function. The left ventricle demonstrates global hypokinesis. The left ventricular internal cavity size was mildly dilated. Left ventricular diastolic function could not be evaluated. There is mild dyskinesis of the left ventricular, entire apical segment.  2.  Right ventricular systolic function is moderately reduced. The right ventricular size is normal. There is moderately elevated pulmonary artery systolic pressure. The estimated right ventricular systolic pressure is 16.5 mmHg.  3. Left atrial size was moderately dilated.  4. Right atrial size was mildly dilated.  5. The mitral valve is normal in structure. Mild mitral valve regurgitation. Moderate mitral annular calcification.  6. The tricuspid valve is abnormal. Tricuspid valve regurgitation is moderate to severe.  7. The aortic valve is tricuspid. Aortic valve regurgitation is not visualized. Aortic valve sclerosis is present, with no evidence of aortic valve stenosis. Comparison(s): No significant change from prior study. Prior images reviewed side by side. FINDINGS  Left Ventricle: No left ventricular thrombus (Definity contrast used). Left ventricular ejection fraction, by estimation, is 25 to 30%. The left ventricle has severely decreased function. The left ventricle demonstrates global hypokinesis. Mild dyskinesis of the left ventricular, entire apical segment. The left ventricular internal cavity size was mildly dilated. There is no left ventricular hypertrophy. Abnormal (paradoxical) septal  motion, consistent with RV pacemaker. Left ventricular diastolic function could not be evaluated due to atrial fibrillation. Left ventricular diastolic function could not be evaluated.  LV Wall Scoring: The entire apex is dyskinetic. The mid anteroseptal segment is akinetic. The posterior wall, mid anterolateral segment, and mid inferior segment are hypokinetic. The anterior wall, basal anteroseptal segment, basal anterolateral segment, mid inferoseptal segment, basal inferior segment, and basal inferoseptal segment are normal. Right Ventricle: The right ventricular size is normal. No increase in right ventricular wall thickness. Right ventricular systolic function is moderately reduced. There is moderately elevated pulmonary artery systolic pressure. The tricuspid regurgitant velocity is 3.73 m/s, and with an assumed right atrial pressure of 3 mmHg, the estimated right ventricular systolic pressure is 53.7 mmHg. Left Atrium: Left atrial size was moderately dilated. Right Atrium: Right atrial size was mildly dilated. Pericardium: There is no evidence of pericardial effusion. Mitral Valve: The mitral valve is normal in structure. Moderate mitral annular calcification. Mild mitral valve regurgitation. Tricuspid Valve: The tricuspid valve is abnormal. Tricuspid valve regurgitation is moderate to severe. Aortic Valve: The aortic valve is tricuspid. Aortic valve regurgitation is not visualized. Aortic valve sclerosis is present, with no evidence of aortic valve stenosis. Aortic valve mean gradient measures 8.0 mmHg. Aortic valve peak gradient measures 15.2 mmHg. Aortic valve area, by VTI measures 1.06 cm. Pulmonic Valve: The pulmonic valve was normal in structure. Pulmonic valve regurgitation is not visualized. Aorta: The aortic root and ascending aorta are structurally normal, with no evidence of dilitation. IAS/Shunts: No atrial level shunt detected by color flow Doppler. Additional Comments: A device lead is  visualized in the right ventricle.  LEFT VENTRICLE PLAX 2D LVIDd:         5.60 cm   Diastology LVIDs:         4.80 cm   LV e' medial:    4.97 cm/s LV PW:         1.10 cm   LV E/e' medial:  27.0 LV IVS:        1.00 cm   LV e' lateral:   8.55 cm/s LVOT diam:     2.10 cm   LV E/e' lateral: 15.7 LV SV:         40 LV SV Index:   20 LVOT Area:     3.46 cm  RIGHT VENTRICLE RV S prime:     7.93 cm/s LEFT ATRIUM  Index        RIGHT ATRIUM           Index LA diam:        4.20 cm 2.11 cm/m   RA Area:     17.60 cm LA Vol (A2C):   80.8 ml 40.59 ml/m  RA Volume:   43.80 ml  22.00 ml/m LA Vol (A4C):   88.2 ml 44.31 ml/m LA Biplane Vol: 84.8 ml 42.60 ml/m  AORTIC VALVE AV Area (Vmax):    1.16 cm AV Area (Vmean):   1.10 cm AV Area (VTI):     1.06 cm AV Vmax:           195.00 cm/s AV Vmean:          129.000 cm/s AV VTI:            0.375 m AV Peak Grad:      15.2 mmHg AV Mean Grad:      8.0 mmHg LVOT Vmax:         65.40 cm/s LVOT Vmean:        41.100 cm/s LVOT VTI:          0.115 m LVOT/AV VTI ratio: 0.31  AORTA Ao Root diam: 3.10 cm MITRAL VALVE                TRICUSPID VALVE MV Area (PHT): 5.84 cm     TR Peak grad:   55.7 mmHg MV Decel Time: 130 msec     TR Vmax:        373.00 cm/s MR Peak grad: 80.3 mmHg MR Vmax:      448.00 cm/s   SHUNTS MV E velocity: 134.00 cm/s  Systemic VTI:  0.12 m MV A velocity: 62.80 cm/s   Systemic Diam: 2.10 cm MV E/A ratio:  2.13 Mihai Croitoru MD Electronically signed by Sanda Klein MD Signature Date/Time: 06/03/2022/1:04:15 PM    Final    CT ABDOMEN PELVIS WO CONTRAST  Result Date: 06/03/2022 CLINICAL DATA:  81 year old female with left lower quadrant abdominal pain. History of recurrent UTI and sepsis. EXAM: CT ABDOMEN AND PELVIS WITHOUT CONTRAST TECHNIQUE: Multidetector CT imaging of the abdomen and pelvis was performed following the standard protocol without IV contrast. RADIATION DOSE REDUCTION: This exam was performed according to the departmental dose-optimization  program which includes automated exposure control, adjustment of the mA and/or kV according to patient size and/or use of iterative reconstruction technique. COMPARISON:  Noncontrast CT Abdomen and Pelvis 05/19/2022. FINDINGS: Lower chest: Prior sternotomy. Cardiac pacemaker leads. Mild cardiomegaly. No pericardial effusion. Increased bilateral lung base opacity since last month appears related to new trace pleural effusions and patchy bilateral lower lobe opacity with borderline consolidation. The lung base involvement is fairly symmetric. Hepatobiliary: Stable noncontrast liver and gallbladder. Pancreas: Stable, negative. Spleen: Stable, negative. Adrenals/Urinary Tract: Normal adrenal glands. Noncontrast kidneys are stable and nonobstructed. No hydronephrosis or pararenal inflammation. No convincing nephrolithiasis. No hydroureter. Urinary bladder partially obscured by hip arthroplasty streak artifact, but evidence of indistinct bladder wall inflammation at the dome which is visible on series 3, image 64. Stomach/Bowel: Extensive large bowel diverticulosis from the transverse colon distally. The the hepatic flexure and mid transverse colon are more gas distended today. But there is no transition point, with retained gas and stool in redundant distal transverse and splenic flexure. Descending colon is decompressed. Rectosigmoid colon decompressed. Extensive diverticula but no definite active large bowel inflammation. Cecum on a lax mesentery. Diverticulosis of the terminal ileum (series  3, image 41) which is decompressed. Cecum is gas distended similar to the mid transverse colon. No cecal wall thickening. Appendix not identified. No pericecal inflammation. No free air or free fluid identified. Gas-filled but nondilated distal small bowel in the right abdomen. A small fat containing umbilical hernia appears stable and inconsequential. Negative stomach. Duodenum is decompressed. Vascular/Lymphatic: Extensive  Aortoiliac calcified atherosclerosis. Vascular patency is not evaluated in the absence of IV contrast. Normal caliber abdominal aorta. No lymphadenopathy identified. Reproductive: Uterus seems to be surgically absent. Ovaries are not identified. Other: Limited visualization of the pelvis from hip arthroplasty streak artifact as before. Musculoskeletal: Chronic spine degeneration with lumbosacral junction ankylosis and osteopenia. Chronic bilateral hip arthroplasty with subsequent streak artifact in the pelvis. Prior sternotomy. No acute osseous abnormality identified. IMPRESSION: 1. Increased lung base opacification since last month appears related to trace new pleural effusions and patchy opacity. Symmetry argues in favor of atelectasis, but acute lung base infection cannot be excluded. 2. Limited CT visualization of the pelvis secondary to artifact from bilateral hip arthroplasties. But the visible urinary bladder dome appears inflamed compatible with UTI and/or Cystitis. 3. No evidence of obstructive uropathy or ascending urinary infection on this noncontrast exam. 4. Widespread severe large bowel diverticulosis, and some distal small bowel diverticulosis also, but no active inflammation identified. Increased gaseous distension of the proximal large bowel might reflect Ileus. 5. Severe Aortic Atherosclerosis (ICD10-I70.0). Electronically Signed   By: Genevie Ann M.D.   On: 06/03/2022 04:49   DG Chest Port 1 View  Result Date: 06/03/2022 CLINICAL DATA:  Back pain.  No trauma. EXAM: PORTABLE CHEST 1 VIEW COMPARISON:  05/19/2022 FINDINGS: Cardiac pacemaker. Postoperative changes in the mediastinum. Cardiac enlargement. Mild vascular congestion. Mild interstitial changes suggesting early edema. Small left pleural effusion. No pneumothorax. Calcification of the aorta. IMPRESSION: Cardiac enlargement with mild vascular congestion and edema. Small left pleural effusion. Electronically Signed   By: Lucienne Capers M.D.    On: 06/03/2022 02:13   DG Lumbar Spine 2-3 Views  Result Date: 06/03/2022 CLINICAL DATA:  Back pain EXAM: LUMBAR SPINE - 2-3 VIEW COMPARISON:  CT abdomen and pelvis 05/19/2022 FINDINGS: Five lumbar type vertebral bodies. Normal alignment. No acute vertebral compression deformities. No focal bone lesion or bone destruction. Degenerative changes with disc space narrowing and endplate osteophyte formation. Multilevel degenerative disc disease. Degenerative changes in the facet joints. Prominent vascular calcification in the aorta. Bilateral hip arthroplasties. IMPRESSION: Normal alignment. Degenerative changes. No acute displaced fractures identified. Electronically Signed   By: Lucienne Capers M.D.   On: 06/03/2022 02:12   DG Thoracic Spine 2 View  Result Date: 06/03/2022 CLINICAL DATA:  Back pain.  No trauma. EXAM: THORACIC SPINE 2 VIEWS COMPARISON:  Two-view chest 07/29/2019 FINDINGS: Normal alignment of the thoracic spine. Mild vertebral wedge deformities of the midthoracic region similar to prior study. No new vertebral compression is identified. Degenerative changes with narrowed disc spaces and endplate osteophyte formation throughout. No focal bone lesion or bone destruction. Bone cortex appears intact. No abnormal paraspinal soft tissue swelling. Mild thoracic scoliosis convex towards the right is unchanged. Postoperative changes noted in the mediastinum. IMPRESSION: Normal alignment of the thoracic spine. No acute displaced fractures identified. Degenerative changes. Electronically Signed   By: Lucienne Capers M.D.   On: 06/03/2022 02:10   DG Chest Port 1 View  Result Date: 05/19/2022 CLINICAL DATA:  Fever, weakness and fatigue. EXAM: PORTABLE CHEST 1 VIEW COMPARISON:  07/29/2019 FINDINGS: Stable cardiac enlargement, radiographic appearance biventricular  pacer/ICD and evidence of prior CABG. Low lung volumes bilaterally. No overt edema, airspace consolidation or pneumothorax. Stable chronic  bilateral pleural thickening. IMPRESSION: Stable cardiac enlargement. Electronically Signed   By: Aletta Edouard M.D.   On: 05/19/2022 15:59   CT ABDOMEN PELVIS WO CONTRAST  Result Date: 05/19/2022 CLINICAL DATA:  Recurrent urinary tract infections and sepsis. Fatigue and weakness. EXAM: CT ABDOMEN AND PELVIS WITHOUT CONTRAST TECHNIQUE: Multidetector CT imaging of the abdomen and pelvis was performed following the standard protocol without IV contrast. RADIATION DOSE REDUCTION: This exam was performed according to the departmental dose-optimization program which includes automated exposure control, adjustment of the mA and/or kV according to patient size and/or use of iterative reconstruction technique. COMPARISON:  None Available. FINDINGS: Lower chest: No acute findings. Hepatobiliary: No mass visualized on this unenhanced exam. Gallbladder is unremarkable. No evidence of biliary ductal dilatation. Pancreas: No mass or inflammatory process visualized on this unenhanced exam. Spleen:  Within normal limits in size. Adrenals/Urinary tract: No evidence of urolithiasis or hydronephrosis. Distal ureters and bladder are not well visualized due to severe artifact from bilateral hip prostheses. Stomach/Bowel: No evidence of obstruction, inflammatory process, or abnormal fluid collections. Severe diverticulosis is seen in the transverse, descending, and sigmoid colon, however there is no evidence of diverticulitis. Vascular/Lymphatic: No pathologically enlarged lymph nodes identified. No evidence of abdominal aortic aneurysm. Aortic atherosclerotic calcification incidentally noted. Reproductive: Obscured by severe artifact through the lower pelvis from bilateral hip prostheses. Other:  None. Musculoskeletal:  No suspicious bone lesions identified. IMPRESSION: No evidence of urolithiasis, hydronephrosis, or other acute findings. Severe colonic diverticulosis, without radiographic evidence of diverticulitis.  Electronically Signed   By: Marlaine Hind M.D.   On: 05/19/2022 15:14    Microbiology: Recent Results (from the past 240 hour(s))  Urine Culture     Status: Abnormal   Collection Time: 06/03/22  4:54 AM   Specimen: Urine, Catheterized  Result Value Ref Range Status   Specimen Description URINE, CATHETERIZED  Final   Special Requests   Final    NONE Performed at [facility], [address], [place], [place] [address]    Culture 80,000 COLONIES/mL ENTEROCOCCUS FAECALIS (A)  Final   Report Status 06/05/2022 FINAL  Final   Organism ID, Bacteria ENTEROCOCCUS FAECALIS (A)  Final      Susceptibility   Enterococcus faecalis - MIC*    AMPICILLIN <=2 SENSITIVE Sensitive     NITROFURANTOIN <=16 SENSITIVE Sensitive     VANCOMYCIN 1 SENSITIVE Sensitive     * 80,000 COLONIES/mL ENTEROCOCCUS FAECALIS  Blood culture (routine x 2)     Status: Abnormal   Collection Time: 06/03/22  5:11 AM   Specimen: BLOOD  Result Value Ref Range Status   Specimen Description BLOOD RIGHT ANTECUBITAL  Final   Special Requests   Final    BOTTLES DRAWN AEROBIC AND ANAEROBIC Blood Culture adequate volume   Culture  Setup Time   Final    GRAM POSITIVE COCCI IN CHAINS IN BOTH AEROBIC AND ANAEROBIC BOTTLES CRITICAL RESULT CALLED TO, READ BACK BY AND VERIFIED WITH: Pamala Hurry [id number] AT 2013 BY EC Performed at [facility], [place] [address] [place], [place] [address]    Culture ENTEROCOCCUS FAECALIS (A)  Final   Report Status 06/05/2022 FINAL  Final   Organism ID, Bacteria ENTEROCOCCUS FAECALIS  Final      Susceptibility   Enterococcus faecalis - MIC*    AMPICILLIN <=2 SENSITIVE Sensitive  VANCOMYCIN 1 SENSITIVE Sensitive     GENTAMICIN SYNERGY SENSITIVE Sensitive     * ENTEROCOCCUS FAECALIS  Blood Culture ID Panel (Reflexed)     Status: Abnormal   Collection Time: 06/03/22  5:11 AM  Result Value Ref Range Status   Enterococcus faecalis DETECTED (A) NOT DETECTED Final    Comment: CRITICAL  RESULT CALLED TO, READ BACK BY AND VERIFIED WITH: C/  PHARMD LYDIA CHEN [id number] AT 2013 BY EC CRITICAL COMMENT ADDED BY A. LAFRANCE 06/03/22 2132     Enterococcus Faecium NOT DETECTED NOT DETECTED Final   Listeria monocytogenes NOT DETECTED NOT DETECTED Final   Staphylococcus species NOT DETECTED NOT DETECTED Final   Staphylococcus aureus (BCID) NOT DETECTED NOT DETECTED Final   Staphylococcus epidermidis NOT DETECTED NOT DETECTED Final   Staphylococcus lugdunensis NOT DETECTED NOT DETECTED Final   Streptococcus species NOT DETECTED NOT DETECTED Final   Streptococcus agalactiae NOT DETECTED NOT DETECTED Final   Streptococcus pneumoniae NOT DETECTED NOT DETECTED Final   Streptococcus pyogenes NOT DETECTED NOT DETECTED Final   A.calcoaceticus-baumannii NOT DETECTED NOT DETECTED Final   Bacteroides fragilis NOT DETECTED NOT DETECTED Final   Enterobacterales NOT DETECTED NOT DETECTED Final   Enterobacter cloacae complex NOT DETECTED NOT DETECTED Final   Escherichia coli NOT DETECTED NOT DETECTED Final   Klebsiella aerogenes NOT DETECTED NOT DETECTED Final   Klebsiella oxytoca NOT DETECTED NOT DETECTED Final   Klebsiella pneumoniae NOT DETECTED NOT DETECTED Final   Proteus species NOT DETECTED NOT DETECTED Final   Salmonella species NOT DETECTED NOT DETECTED Final   Serratia marcescens NOT DETECTED NOT DETECTED Final   Haemophilus influenzae NOT DETECTED NOT DETECTED Final   Neisseria meningitidis NOT DETECTED NOT DETECTED Final   Pseudomonas aeruginosa NOT DETECTED NOT DETECTED Final   Stenotrophomonas maltophilia NOT DETECTED NOT DETECTED Final   Candida albicans NOT DETECTED NOT DETECTED Final   Candida auris NOT DETECTED NOT DETECTED Final   Candida glabrata NOT DETECTED NOT DETECTED Final   Candida krusei NOT DETECTED NOT DETECTED Final   Candida parapsilosis NOT DETECTED NOT DETECTED Final   Candida tropicalis NOT DETECTED NOT DETECTED Final   Cryptococcus neoformans/gattii NOT  DETECTED NOT DETECTED Final   Vancomycin resistance NOT DETECTED NOT DETECTED Final    Comment: Performed at [facility], [address] [place], [place] [address]  Blood culture (routine x 2)     Status: Abnormal   Collection Time: 06/03/22  5:15 AM   Specimen: BLOOD  Result Value Ref Range Status   Specimen Description BLOOD SITE NOT SPECIFIED  Final   Special Requests   Final    BOTTLES DRAWN AEROBIC AND ANAEROBIC Blood Culture adequate volume   Culture  Setup Time   Final    GRAM POSITIVE COCCI IN CHAINS IN BOTH AEROBIC AND ANAEROBIC BOTTLES CRITICAL VALUE NOTED.  VALUE IS CONSISTENT WITH PREVIOUSLY REPORTED AND CALLED VALUE.    Culture (A)  Final    ENTEROCOCCUS FAECALIS SUSCEPTIBILITIES PERFORMED ON PREVIOUS CULTURE WITHIN THE LAST 5 DAYS. Performed at [facility], [place] [address] [place], [place] [address]    Report Status 06/05/2022 FINAL  Final  Culture, blood (Routine X 2) w Reflex to ID Panel     Status: None   Collection Time: 06/04/22  7:05 AM   Specimen: BLOOD  Result Value Ref Range Status   Specimen Description BLOOD VENOUS  Final   Special Requests   Final    BOTTLES DRAWN AEROBIC AND  ANAEROBIC Blood Culture results may not be optimal due to an inadequate volume of blood received in culture bottles   Culture   Final    NO GROWTH 5 DAYS Performed at [place] [address], [place], [place] [address]    Report Status 06/09/2022 FINAL  Final  Culture, blood (Routine X 2) w Reflex to ID Panel     Status: None   Collection Time: 06/04/22  7:06 AM   Specimen: BLOOD  Result Value Ref Range Status   Specimen Description BLOOD LEFT ANTECUBITAL  Final   Special Requests   Final    BOTTLES DRAWN AEROBIC AND ANAEROBIC Blood Culture results may not be optimal due to an inadequate volume of blood received in culture bottles   Culture   Final    NO GROWTH 5 DAYS Performed at [facility], [place] [address] [place], [place] [address]     Report Status 06/09/2022 FINAL  Final     Labs: Basic Metabolic Panel: Recent Labs  Lab 06/04/22 0705 06/05/22 0242 06/06/22 0056 06/07/22 0736 06/08/22 0146 06/09/22 0332 06/10/22 0420  NA 136   < > 129* 135 134* 135 140  K 4.0   < > 3.5 3.8 4.0 4.0 2.8*  CL 96*   < > 94* 98 99 102 109  CO2 27   < > 21* 24 23 22  21*  GLUCOSE 174*   < > 141* 139* 155* 167* 112*  BUN 50*   < > 65* 61* 62* 57* 43*  CREATININE 1.91*   < > 2.30* 2.21* 2.19* 2.39* 1.73*  CALCIUM 9.6   < > 8.8* 9.1 9.0 8.9 7.5*  MG 2.0  --   --  2.0 2.0  --   --    < > = values in this interval not displayed.   Liver Function Tests: Recent Labs  Lab 06/04/22 0705 06/05/22 0242  AST 38 28  ALT 27 26  ALKPHOS 109 97  BILITOT 1.5* 1.1  PROT 6.4* 6.1*  ALBUMIN 2.2* 1.9*   No results for input(s): "LIPASE", "AMYLASE" in the last 168 hours.  No results for input(s): "AMMONIA" in the last 168 hours. CBC: Recent Labs  Lab 06/04/22 0705 06/05/22 0242 06/06/22 0056  WBC 10.4 9.3 7.7  HGB 9.2* 8.4* 9.7*  HCT 27.2* 25.1* 28.5*  MCV 85.5 86.0 84.1  PLT 216 206 225   Cardiac Enzymes: No results for input(s): "CKTOTAL", "CKMB", "CKMBINDEX", "TROPONINI" in the last 168 hours. BNP: BNP (last 3 results) Recent Labs    10/26/21 1056 11/29/21 1057 06/03/22 0228  BNP 479.3* 396.3* 1,191.1*    ProBNP (last 3 results) No results for input(s): "PROBNP" in the last 8760 hours.  CBG: Recent Labs  Lab 06/09/22 1144 06/09/22 1539 06/09/22 1724 06/09/22 2131 06/10/22 0851  GLUCAP 263* 199* 118* 175* 111*       Signed:  Domenic Polite MD.  [facility] 06/10/2022, 10:06 AM

## 2022-06-09 NOTE — Progress Notes (Signed)
Peripherally Inserted Central Catheter Placement  The IV Nurse has discussed with the patient and/or persons authorized to consent for the patient, the purpose of this procedure and the potential benefits and risks involved with this procedure.  The benefits include less needle sticks, lab draws from the catheter, and the patient may be discharged home with the catheter. Risks include, but not limited to, infection, bleeding, blood clot (thrombus formation), and puncture of an artery; nerve damage and irregular heartbeat and possibility to perform a PICC exchange if needed/ordered by physician.  Alternatives to this procedure were also discussed.  Bard Power PICC patient education guide, fact sheet on infection prevention and patient information card has been provided to patient /or left at bedside.    PICC Placement Documentation  PICC Single Lumen 06/09/22 Right Brachial 38 cm 0 cm (Active)  Indication for Insertion or Continuance of Line Prolonged intravenous therapies 06/09/22 0950  Exposed Catheter (cm) 0 cm 06/09/22 0950  Site Assessment Clean, Dry, Intact 06/09/22 0950  Line Status Flushed;Blood return noted;Saline locked 06/09/22 0950  Dressing Type Transparent 06/09/22 0950  Dressing Status Antimicrobial disc in place 06/09/22 [place] checked and tightened 06/09/22 0950  Dressing Change Due 06/16/22 06/09/22 0950       Scotty Court 06/09/2022, 9:52 AM

## 2022-06-09 NOTE — Progress Notes (Addendum)
Patient ID: Elaine [name], female   DOB: 1940-12-23, 81 y.o.   MRN: [id number]     Advanced Heart Failure Rounding Note  PCP-Cardiologist: Glenetta Hew, MD   Subjective:    TEE: Mildly dilated LV with EF 30%, mild RV enlargement with mild RV dysfunction, mod-severe TR with < 1 cm tricuspid valve vegetation.    MRI spine: Discitis/osteomyelitis at T8-T9 with small fluid collection.    Continues on IV ceftriaxone and IV ampicillin per ID. Plan to d/c to SNF with PICC for 6wk IV abx course.   Feels great this am. No complaints. No dyspnea or CP today.   Objective:   Weight Range: 104.4 kg Body mass index is 42.1 kg/m.   Vital Signs:   Temp:  [97.1 F (36.2 C)-98.1 F (36.7 C)] 98.1 F (36.7 C) (10/13 0556) Pulse Rate:  [72-76] 73 (10/13 0556) Resp:  [16-21] 16 (10/13 0556) BP: (98-123)/(49-61) 118/50 (10/13 0556) SpO2:  [96 %-100 %] 98 % (10/13 0556) Weight:  [104.4 kg] 104.4 kg (10/13 0556) Last BM Date : 06/07/22  Weight change: Filed Weights   06/07/22 0452 06/08/22 0610 06/09/22 0556  Weight: 101.7 kg 103.8 kg 104.4 kg    Intake/Output:   Intake/Output Summary (Last 24 hours) at 06/09/2022 0803 Last data filed at 06/09/2022 0717 Gross per 24 hour  Intake 740 ml  Output 775 ml  Net -35 ml      Physical Exam    General:  elderly appearing.  No respiratory difficulty HEENT: normal Neck: supple. JVD ~8 cm. Carotids 2+ bilat; no bruits. No lymphadenopathy or thyromegaly appreciated. Cor: PMI nondisplaced. Regular rate & rhythm. No rubs, gallops or murmurs. Lungs: clear Abdomen: soft, nontender, nondistended. No hepatosplenomegaly. No bruits or masses. Good bowel sounds. Extremities: no cyanosis, clubbing, rash, non-pitting BLE edema  Neuro: alert & oriented x 3, cranial nerves grossly intact. moves all 4 extremities w/o difficulty. Affect pleasant.   Telemetry   Afib, BiV paced 70s (Personally reviewed)    Labs    CBC No results for input(s): "WBC",  "NEUTROABS", "HGB", "HCT", "MCV", "PLT" in the last 72 hours.  Basic Metabolic Panel Recent Labs    06/07/22 0736 06/08/22 0146 06/09/22 0332  NA 135 134* 135  K 3.8 4.0 4.0  CL 98 99 102  CO2 '24 23 22  '$ GLUCOSE 139* 155* 167*  BUN 61* 62* 57*  CREATININE 2.21* 2.19* 2.39*  CALCIUM 9.1 9.0 8.9  MG 2.0 2.0  --    Liver Function Tests No results for input(s): "AST", "ALT", "ALKPHOS", "BILITOT", "PROT", "ALBUMIN" in the last 72 hours.  No results for input(s): "LIPASE", "AMYLASE" in the last 72 hours. Cardiac Enzymes No results for input(s): "CKTOTAL", "CKMB", "CKMBINDEX", "TROPONINI" in the last 72 hours.  BNP: BNP (last 3 results) Recent Labs    10/26/21 1056 11/29/21 1057 06/03/22 0228  BNP 479.3* 396.3* 1,191.1*    ProBNP (last 3 results) No results for input(s): "PROBNP" in the last 8760 hours.   D-Dimer No results for input(s): "DDIMER" in the last 72 hours. Hemoglobin A1C No results for input(s): "HGBA1C" in the last 72 hours. Fasting Lipid Panel No results for input(s): "CHOL", "HDL", "LDLCALC", "TRIG", "CHOLHDL", "LDLDIRECT" in the last 72 hours. Thyroid Function Tests No results for input(s): "TSH", "T4TOTAL", "T3FREE", "THYROIDAB" in the last 72 hours.  Invalid input(s): "FREET3"  Other results:   Imaging    [place] EKG SITE RITE  Result Date: 06/08/2022 If Site Rite image not attached, placement  could not be confirmed due to current cardiac rhythm.    Medications:     Scheduled Medications:  apixaban  2.5 mg Oral BID   Gerhardt's butt cream   Topical BID   insulin aspart  0-5 Units Subcutaneous QHS   insulin aspart  0-6 Units Subcutaneous TID WC   isosorbide-hydrALAZINE  0.5 tablet Oral TID   levothyroxine  112 mcg Oral Q0600   metoprolol succinate  50 mg Oral BID   rosuvastatin  5 mg Oral QODAY   sodium chloride flush  3 mL Intravenous Q12H   torsemide  40 mg Oral Daily   umeclidinium-vilanterol  1 puff Inhalation Daily     Infusions:  ampicillin (OMNIPEN) IV 2 g (06/09/22 0154)   cefTRIAXone (ROCEPHIN)  IV 2 g (06/08/22 2344)    PRN Medications: acetaminophen **OR** acetaminophen, camphor-menthol, diphenhydrAMINE-zinc acetate, melatonin, ondansetron **OR** ondansetron (ZOFRAN) IV, senna-docusate    Assessment/Plan   1. Acute on chronic systolic CHF: Ischemic cardiomyopathy. St Jude CRT-D device, now s/p AV nodal ablation. Echo in 3/21 showed EF 20% with diffuse hypokinesis, mildly dilated RV with mildly decreased systolic function, PASP 49 mmHg, dilated IVC. Echo 12/22 EF 20-25%, mildly decreased RV function. Echo 10/23 EF 25 to 30%, mild dyskinesis of the left ventricular entire apical segment. Moderate reduction in RV systolic function, RVSP 16.1 mmHg, moderate to severe TR.  - Presented NYHA III-IIIb. Limited at baseline d/t peripheral neuropathy. Volume improved on exam. Diuresed w/ IV lasix. Increase Torsemide to home dose '60mg'$  daily. Doubt I&O accurate but is up 1.5lbs.  - Unable to tolerate Bidil 1 tab tid, continue Bidil 1/2 tab tid  - On Toprol XL 100 qam/50 qpm at home, '50mg'$  BID here - Off empagliflozin due to yeast infection and UTI.  - Off spironolactone with side effects.  2. CAD: S/p CABG in 2015. No chest pain.   - No ASA given apixaban use.  - Continue Crestor 5 daily + Zetia, good lipids 12/22 3. Atrial fibrillation: Chronic now, looks like it has gone on since 2018.  Unable to cardiovert her on amiodarone so it was stopped.  At this point Afib chronic. She is now s/p AVN ablation to allow more BiV pacing. BiV-paced. - Continue apixaban.   4. CKD: Stage 4: SCr baseline 2.1-2.5. 2.39 today. Watch creatinine closely.  5. 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  MRI back shows T8-9 discitis and osteomyelitis with fluid collection. TEE showed a small (<1 cm) vegetation on the tricuspid valve, does not appear to involve device leads though in  close proximity.  - Abx management per ID. Discussed with EP. Not a good candidate for device explantation given severity of CHF and device dependence (hx AV nodal ablation) - Back pain improving  6. DM  - SSI per primary 7. E faecalis endocarditis - TEE showed a small (<1 cm) vegetation on the tricuspid valve, does not appear to involve device leads though in close proximity.  - ID treating w/ IV ampicillin and ceftriaxone X 6 weeks. Will need to d/c home w/ PICC. - No plan for ICD extraction as above  Deconditioned. PT/OT following. Plan to go to [facility] at d/c.   AFH meds for d/c: Apixaban 2.'5mg'$  BID Bidil 20-37.5  1/2 tab Metoprolol succinate 50 BID Rosuvastatin '5mg'$  QODay Torsemide '60mg'$  daily IV abx as pended by ID  Length of Stay: [address], AGACNP-BC  06/09/2022, 8:03 AM  Advanced Heart  Failure Team Pager [phone] (M-F; 7a - 5p)  Please contact [facility] for night-coverage after hours (5p -7a ) and weekends on [email or web address]

## 2022-06-09 NOTE — Progress Notes (Signed)
Physical Therapy Treatment Patient Details Name: Elaine [name] MRN: [id number] DOB: [birth year] Today's Date: 06/09/2022   History of Present Illness Pt is an 81 y/o F admitted on 06/02/22 with a working diagnosis of decompensated heart failure. Pt is also being treated for a UTI, positive diverticulosis. PMH: CAD, a-fib, COPD, chronic hypoxemic respiratory failure & systolic heart failure    PT Comments    Pt very eager to mobilize stating "I do not stay in the bed." I stay up til 1230-1am because I don't want to go to bed." Pt requiring mod/maxA for bed mobility and minA for OOB mobility but demo's very limited activity tolerance, generalized weakness, and DOE of 4/4 requiring frequent rest breaks. Pt to benefit from SNF upon d/c to allow for increased time to address deficits and progress towards mod I level of function. Acute PT to cont to follow.    Recommendations for follow up therapy are one component of a multi-disciplinary discharge planning process, led by the attending physician.  Recommendations may be updated based on patient status, additional functional criteria and insurance authorization.  Follow Up Recommendations  Skilled nursing-short term rehab (<3 hours/day) Can patient physically be transported by private vehicle: No   Assistance Recommended at Discharge Frequent or constant Supervision/Assistance  Patient can return home with the following A lot of help with walking and/or transfers;A lot of help with bathing/dressing/bathroom;Assistance with cooking/housework;Assist for transportation;Help with stairs or ramp for entrance   Equipment Recommendations  None recommended by PT    Recommendations for Other Services       Precautions / Restrictions Precautions Precautions: Fall Restrictions Weight Bearing Restrictions: No     Mobility  Bed Mobility Overal bed mobility: Needs Assistance Bed Mobility: Supine to Sit     Supine to sit: Mod assist, HOB elevated      General bed mobility comments: definite use of bed rail and required modA for LE management off EOB and maxA to scoot hips to EOB    Transfers Overall transfer level: Needs assistance Equipment used: Rolling walker (2 wheels) Transfers: Sit to/from Stand Sit to Stand: Mod assist   Step pivot transfers: Min assist       General transfer comment: despite max verbal cues pt pulled up on walker with PT holding walker down. Pt with report of having recent R wrist carpel tunnel surgery which is still painful limiting the abilty to WB on it. Pt able to step with RW to w/c with increased time and labored effort, 4/4 DOE, SpO2 >94% on RA.    Ambulation/Gait               General Gait Details: worked on marching in place however pt with poor endurance and unable to maintain upright posture leaning over walker on elbows, pt could only complete 6 prior to needing to sit   Stairs             Wheelchair Mobility    Modified Rankin (Stroke Patients Only)       Balance Overall balance assessment: Needs assistance Sitting-balance support: No upper extremity supported, Feet supported Sitting balance-Leahy Scale: Fair Sitting balance - Comments: requires support of UE on bed rail   Standing balance support: Bilateral upper extremity supported, Reliant on assistive device for balance Standing balance-Leahy Scale: Poor Standing balance comment: dependent on RW  Cognition Arousal/Alertness: Awake/alert Behavior During Therapy: WFL for tasks assessed/performed Overall Cognitive Status: No family/caregiver present to determine baseline cognitive functioning                     Current Attention Level: Sustained           General Comments: pt can be tangential but does follow commands majority of time. Pt very appreciative of PT services and getting up OOB        Exercises      General Comments General comments (skin  integrity, edema, etc.): VSS on RA      Pertinent Vitals/Pain Pain Assessment Pain Assessment: No/denies pain    Home Living                          Prior Function            PT Goals (current goals can now be found in the care plan section) Acute Rehab PT Goals Patient Stated Goal: get better PT Goal Formulation: With patient Time For Goal Achievement: 06/19/22 Potential to Achieve Goals: Good Progress towards PT goals: Progressing toward goals    Frequency    Min 3X/week      PT Plan Current plan remains appropriate    Co-evaluation              AM-PAC PT "6 Clicks" Mobility   Outcome Measure  Help needed turning from your back to your side while in a flat bed without using bedrails?: A Lot Help needed moving from lying on your back to sitting on the side of a flat bed without using bedrails?: A Lot Help needed moving to and from a bed to a chair (including a wheelchair)?: A Lot Help needed standing up from a chair using your arms (e.g., wheelchair or bedside chair)?: A Lot Help needed to walk in hospital room?: Total Help needed climbing 3-5 steps with a railing? : Total 6 Click Score: 10    End of Session Equipment Utilized During Treatment: Gait belt Activity Tolerance: Patient limited by fatigue Patient left: in chair;with call bell/phone within reach;with chair alarm set;with nursing/sitter in room Nurse Communication: Mobility status PT Visit Diagnosis: Muscle weakness (generalized) (M62.81);Unsteadiness on feet (R26.81);Pain     Time: 2876-8115 PT Time Calculation (min) (ACUTE ONLY): 23 min  Charges:  $Gait Training: 8-22 mins $Therapeutic Activity: 8-22 mins                     Kittie Plater, PT, DPT Acute Rehabilitation Services Secure chat preferred Office #: [phone]    Berline Lopes 06/09/2022, 12:16 PM

## 2022-06-09 NOTE — Progress Notes (Signed)
PROGRESS NOTE    Elaine [name]  POE:[id number] DOB: February 12, 1941 DOA: 06/02/2022 PCP: Ginger Organ., MD  81/F with history of chronic systolic CHF, chronic hypoxic respiratory failure on home O2, COPD, paroxysmal A-fib, CAD presented to the ED with worsening dyspnea, edema, dysuria and back pain. Chest x-ray noted small left pleural effusion, CT abdomen pelvis with inflammation at the urinary bladder. -Admitted, started on diuretics -She was then noted to have positive blood cultures with Enterococcus -Infectious disease was consulted, subsequently underwent a TEE which noted tricuspid valve vegetation consistent with endocarditis -MRI positive for discitis, osteomyelitis at T8, T9, no epidural abscess   Subjective: -feels better overall, breathing and back pain is improving  Assessment and Plan:  Acute on chronic systolic CHF  -Echo EF 25 to 30%, Moderate reduction in RV systolic function, Moderate to severe TR  -Known ischemic cardiomyopathy -Diuresed with IV Lasix, switched to oral torsemide, per CHF team -Also remains on BiDil, Toprol -Intolerant to SGLT2 and Aldactone -PT OT consulted, SNF recommended, TOC following  Endocarditis of tricuspid valve T8, T9 discitis/osteomyelitis Enterococcus faecalis bacteremia and UTI -TEE positive for small vegetation on tricuspid valve, did not appear to involve device leads, cards to make EP aware -Appreciate infectious disease input -Remains on IV ampicillin and ceftriaxone -Repeat blood cultures from 10/8 negative X 4 days -Place PICC line today -Duration of antibiotics per ID  Permanent atrial fibrillation - CHA2DS2-VASc Score 6, on Eliquis -Continue Toprol and apixaban, off amiodarone  AKI on CKD stage IV -Creatinine now stable at baseline of 2.1-2.5 -Stable  CAD status post CABG Stage Will, continue metoprolol and statin  Essential hypertension -Stable continue Toprol and BiDil  Type 2 diabetes mellitus with  hyperlipidemia (HCC) -Stable continue SSI  Chronic respiratory failure with hypoxia (HCC) COPD -Stable, continue current inhalers  Hypothyroidism Continue with levothyroxine   Class 3 obesity (HCC) Calculated BMI 40,2   DVT prophylaxis: Eliquis Code Status: Full Family Communication: No family at bedside, called and updated son 10/11 Disposition Plan: TOC following, plan for SNF in 1 to 2 days  Consultants:    Procedures:   Antimicrobials:    Objective: Vitals:   06/08/22 2056 06/08/22 2338 06/09/22 0556 06/09/22 0850  BP: (!) 121/58 (!) 123/54 (!) 118/50 (!) 122/101  Pulse: 76 73 73 80  Resp: 16  16   Temp: 97.9 F (36.6 C)  98.1 F (36.7 C)   TempSrc: Oral  Oral   SpO2: 100%  98%   Weight:   104.4 kg   Height:        Intake/Output Summary (Last 24 hours) at 06/09/2022 1143 Last data filed at 06/09/2022 0717 Gross per 24 hour  Intake 740 ml  Output 675 ml  Net 65 ml   Filed Weights   06/07/22 0452 06/08/22 0610 06/09/22 0556  Weight: 101.7 kg 103.8 kg 104.4 kg    Examination:  General exam: Pleasant elderly female laying in bed, AAOx3, no distress HEENT: No JVD CVS: S1-S2, regular rhythm, systolic murmur Lungs: Clear anteriorly Abdomen: Soft, nontender, bowel sounds present Extremities: Trace to 1+ edema  Neuro: Moves all extremities, no localizing signs  Data Reviewed:   CBC: Recent Labs  Lab 06/02/22 1538 06/03/22 0243 06/03/22 0740 06/04/22 0705 06/05/22 0242 06/06/22 0056  WBC 14.0*  --  12.7* 10.4 9.3 7.7  HGB 9.9* 10.2* 9.3* 9.2* 8.4* 9.7*  HCT 29.1* 30.0* 28.6* 27.2* 25.1* 28.5*  MCV 87.9  --  89.1 85.5 86.0 84.1  PLT 293  --  243 216 206 160   Basic Metabolic Panel: Recent Labs  Lab 06/04/22 0705 06/05/22 0242 06/06/22 0056 06/07/22 0736 06/08/22 0146 06/09/22 0332  NA 136 134* 129* 135 134* 135  K 4.0 4.1 3.5 3.8 4.0 4.0  CL 96* 99 94* 98 99 102  CO2 27 24 21* '24 23 22  '$ GLUCOSE 174* 156* 141* 139* 155* 167*  BUN  50* 62* 65* 61* 62* 57*  CREATININE 1.91* 2.25* 2.30* 2.21* 2.19* 2.39*  CALCIUM 9.6 8.5* 8.8* 9.1 9.0 8.9  MG 2.0  --   --  2.0 2.0  --    GFR: Estimated Creatinine Clearance: 20.9 mL/min (A) (by C-G formula based on SCr of 2.39 mg/dL (H)). Liver Function Tests: Recent Labs  Lab 06/02/22 1538 06/03/22 0740 06/04/22 0705 06/05/22 0242  AST 36 31 38 28  ALT '28 27 27 26  '$ ALKPHOS 104 101 109 97  BILITOT 2.6* 2.0* 1.5* 1.1  PROT 7.4 6.9 6.4* 6.1*  ALBUMIN 2.6* 2.4* 2.2* 1.9*   Recent Labs  Lab 06/02/22 1538  LIPASE 69*   No results for input(s): "AMMONIA" in the last 168 hours. Coagulation Profile: No results for input(s): "INR", "PROTIME" in the last 168 hours. Cardiac Enzymes: No results for input(s): "CKTOTAL", "CKMB", "CKMBINDEX", "TROPONINI" in the last 168 hours. BNP (last 3 results) No results for input(s): "PROBNP" in the last 8760 hours. HbA1C: No results for input(s): "HGBA1C" in the last 72 hours. CBG: Recent Labs  Lab 06/08/22 1208 06/08/22 1608 06/08/22 2059 06/09/22 0014 06/09/22 0820  GLUCAP 199* 234* 201* 205* 126*   Lipid Profile: No results for input(s): "CHOL", "HDL", "LDLCALC", "TRIG", "CHOLHDL", "LDLDIRECT" in the last 72 hours. Thyroid Function Tests: No results for input(s): "TSH", "T4TOTAL", "FREET4", "T3FREE", "THYROIDAB" in the last 72 hours. Anemia Panel: No results for input(s): "VITAMINB12", "FOLATE", "FERRITIN", "TIBC", "IRON", "RETICCTPCT" in the last 72 hours. Urine analysis:    Component Value Date/Time   COLORURINE YELLOW 06/03/2022 0440   APPEARANCEUR TURBID (A) 06/03/2022 0440   LABSPEC 1.012 06/03/2022 0440   PHURINE 5.0 06/03/2022 0440   GLUCOSEU NEGATIVE 06/03/2022 0440   HGBUR LARGE (A) 06/03/2022 0440   BILIRUBINUR NEGATIVE 06/03/2022 0440   KETONESUR NEGATIVE 06/03/2022 0440   PROTEINUR 100 (A) 06/03/2022 0440   UROBILINOGEN 0.2 02/02/2014 1943   NITRITE NEGATIVE 06/03/2022 0440   LEUKOCYTESUR LARGE (A) 06/03/2022  0440   Sepsis Labs: '@LABRCNTIP'$ (procalcitonin:4,lacticidven:4)  ) Recent Results (from the past 240 hour(s))  Urine Culture     Status: Abnormal   Collection Time: 06/03/22  4:54 AM   Specimen: Urine, Catheterized  Result Value Ref Range Status   Specimen Description URINE, CATHETERIZED  Final   Special Requests   Final    NONE Performed at [facility], [address] [place], [place] [address]    Culture 80,000 COLONIES/mL ENTEROCOCCUS FAECALIS (A)  Final   Report Status 06/05/2022 FINAL  Final   Organism ID, Bacteria ENTEROCOCCUS FAECALIS (A)  Final      Susceptibility   Enterococcus faecalis - MIC*    AMPICILLIN <=2 SENSITIVE Sensitive     NITROFURANTOIN <=16 SENSITIVE Sensitive     VANCOMYCIN 1 SENSITIVE Sensitive     * 80,000 COLONIES/mL ENTEROCOCCUS FAECALIS  Blood culture (routine x 2)     Status: Abnormal   Collection Time: 06/03/22  5:11 AM   Specimen: BLOOD  Result Value Ref Range Status   Specimen Description BLOOD RIGHT ANTECUBITAL  Final   Special Requests   Final    BOTTLES DRAWN AEROBIC AND ANAEROBIC Blood Culture adequate volume   Culture  Setup Time   Final    GRAM POSITIVE COCCI IN CHAINS IN BOTH AEROBIC AND ANAEROBIC BOTTLES CRITICAL RESULT CALLED TO, READ BACK BY AND VERIFIED WITH: Pamala Hurry [id number] AT 2013 BY EC Performed at [facility], [place] [address], [place], [place] [address]    Culture ENTEROCOCCUS FAECALIS (A)  Final   Report Status 06/05/2022 FINAL  Final   Organism ID, Bacteria ENTEROCOCCUS FAECALIS  Final      Susceptibility   Enterococcus faecalis - MIC*    AMPICILLIN <=2 SENSITIVE Sensitive     VANCOMYCIN 1 SENSITIVE Sensitive     GENTAMICIN SYNERGY SENSITIVE Sensitive     * ENTEROCOCCUS FAECALIS  Blood Culture ID Panel (Reflexed)     Status: Abnormal   Collection Time: 06/03/22  5:11 AM  Result Value Ref Range Status   Enterococcus faecalis DETECTED (A) NOT DETECTED Final    Comment: CRITICAL RESULT CALLED TO,  READ BACK BY AND VERIFIED WITH: C/  PHARMD LYDIA CHEN [id number] AT 2013 BY EC CRITICAL COMMENT ADDED BY A. LAFRANCE 06/03/22 2132     Enterococcus Faecium NOT DETECTED NOT DETECTED Final   Listeria monocytogenes NOT DETECTED NOT DETECTED Final   Staphylococcus species NOT DETECTED NOT DETECTED Final   Staphylococcus aureus (BCID) NOT DETECTED NOT DETECTED Final   Staphylococcus epidermidis NOT DETECTED NOT DETECTED Final   Staphylococcus lugdunensis NOT DETECTED NOT DETECTED Final   Streptococcus species NOT DETECTED NOT DETECTED Final   Streptococcus agalactiae NOT DETECTED NOT DETECTED Final   Streptococcus pneumoniae NOT DETECTED NOT DETECTED Final   Streptococcus pyogenes NOT DETECTED NOT DETECTED Final   A.calcoaceticus-baumannii NOT DETECTED NOT DETECTED Final   Bacteroides fragilis NOT DETECTED NOT DETECTED Final   Enterobacterales NOT DETECTED NOT DETECTED Final   Enterobacter cloacae complex NOT DETECTED NOT DETECTED Final   Escherichia coli NOT DETECTED NOT DETECTED Final   Klebsiella aerogenes NOT DETECTED NOT DETECTED Final   Klebsiella oxytoca NOT DETECTED NOT DETECTED Final   Klebsiella pneumoniae NOT DETECTED NOT DETECTED Final   Proteus species NOT DETECTED NOT DETECTED Final   Salmonella species NOT DETECTED NOT DETECTED Final   Serratia marcescens NOT DETECTED NOT DETECTED Final   Haemophilus influenzae NOT DETECTED NOT DETECTED Final   Neisseria meningitidis NOT DETECTED NOT DETECTED Final   Pseudomonas aeruginosa NOT DETECTED NOT DETECTED Final   Stenotrophomonas maltophilia NOT DETECTED NOT DETECTED Final   Candida albicans NOT DETECTED NOT DETECTED Final   Candida auris NOT DETECTED NOT DETECTED Final   Candida glabrata NOT DETECTED NOT DETECTED Final   Candida krusei NOT DETECTED NOT DETECTED Final   Candida parapsilosis NOT DETECTED NOT DETECTED Final   Candida tropicalis NOT DETECTED NOT DETECTED Final   Cryptococcus neoformans/gattii NOT DETECTED NOT  DETECTED Final   Vancomycin resistance NOT DETECTED NOT DETECTED Final    Comment: Performed at [facility], [address] [place],  [address]  Blood culture (routine x 2)     Status: Abnormal   Collection Time: 06/03/22  5:15 AM   Specimen: BLOOD  Result Value Ref Range Status   Specimen Description BLOOD SITE NOT SPECIFIED  Final   Special Requests   Final    BOTTLES DRAWN AEROBIC AND ANAEROBIC Blood Culture adequate volume   Culture  Setup Time   Final    GRAM POSITIVE  COCCI IN CHAINS IN BOTH AEROBIC AND ANAEROBIC BOTTLES CRITICAL VALUE NOTED.  VALUE IS CONSISTENT WITH PREVIOUSLY REPORTED AND CALLED VALUE.    Culture (A)  Final    ENTEROCOCCUS FAECALIS SUSCEPTIBILITIES PERFORMED ON PREVIOUS CULTURE WITHIN THE LAST 5 DAYS. Performed at [facility], [place] [address] [place], [place] [address]    Report Status 06/05/2022 FINAL  Final  Culture, blood (Routine X 2) w Reflex to ID Panel     Status: None (Preliminary result)   Collection Time: 06/04/22  7:05 AM   Specimen: BLOOD  Result Value Ref Range Status   Specimen Description BLOOD VENOUS  Final   Special Requests   Final    BOTTLES DRAWN AEROBIC AND ANAEROBIC Blood Culture results may not be optimal due to an inadequate volume of blood received in culture bottles   Culture   Final    NO GROWTH 4 DAYS Performed at [facility], [place] [address] [place], [place] [address]    Report Status PENDING  Incomplete  Culture, blood (Routine X 2) w Reflex to ID Panel     Status: None (Preliminary result)   Collection Time: 06/04/22  7:06 AM   Specimen: BLOOD  Result Value Ref Range Status   Specimen Description BLOOD LEFT ANTECUBITAL  Final   Special Requests   Final    BOTTLES DRAWN AEROBIC AND ANAEROBIC Blood Culture results may not be optimal due to an inadequate volume of blood received in culture bottles   Culture   Final    NO GROWTH 4 DAYS Performed at [facility], [place] [address]  [place], [place] [address]    Report Status PENDING  Incomplete     Radiology Studies: [place] EKG SITE RITE  Result Date: 06/08/2022 If Site Rite image not attached, placement could not be confirmed due to current cardiac rhythm.    Scheduled Meds:  apixaban  2.5 mg Oral BID   Chlorhexidine Gluconate Cloth  6 each Topical Daily   Gerhardt's butt cream   Topical BID   insulin aspart  0-5 Units Subcutaneous QHS   insulin aspart  0-6 Units Subcutaneous TID WC   isosorbide-hydrALAZINE  0.5 tablet Oral TID   levothyroxine  112 mcg Oral Q0600   metoprolol succinate  50 mg Oral BID   rosuvastatin  5 mg Oral QODAY   sodium chloride flush  10-40 mL Intracatheter Q12H   sodium chloride flush  3 mL Intravenous Q12H   torsemide  60 mg Oral Daily   umeclidinium-vilanterol  1 puff Inhalation Daily   Continuous Infusions:  ampicillin (OMNIPEN) IV 2 g (06/09/22 1035)   cefTRIAXone (ROCEPHIN)  IV 2 g (06/09/22 1131)     LOS: 6 days    Time spent:  65mn    PDomenic Polite MD [facility]   06/09/2022, 11:43 AM

## 2022-06-09 NOTE — TOC Progression Note (Addendum)
Transition of Care [facility]) - Progression Note    Patient Details  Name: Elaine [name] MRN: [id number] Date of Birth: April 05, 1941  Transition of Care [facility]) CM/SW Leonard, [place] Phone Number: 06/09/2022, 12:03 PM  Clinical Narrative:      Patients insurance authorization good through today 10/13 for SNF placement. CSW spoke with patients insurance and they said patient can still dc over tomorrow the 14th if medically ready before 11:59pm. Facility will just need to call and let insurance know patient arrived. If patient does not dc over tomorrow, insurance will need to be resubmitted.CSW following to restart insurance authorization closer to patient being medically ready for dc. CSW will continue to follow and assist with patients dc planning needs.  Expected Discharge Plan: [place] Barriers to Discharge: Continued Medical Work up  Expected Discharge Plan and Services Expected Discharge Plan: [place] Choice: [place] arrangements for the past 2 months: Single Family Home                                       Social Determinants of Health (SDOH) Interventions    Readmission Risk Interventions     No data to display

## 2022-06-09 NOTE — Progress Notes (Signed)
Came to room for Anoro inhaler, pt in sterile procedure currently.

## 2022-06-10 LAB — BASIC METABOLIC PANEL
Anion gap: 10 (ref 5–15)
BUN: 43 mg/dL — ABNORMAL HIGH (ref 8–23)
CO2: 21 mmol/L — ABNORMAL LOW (ref 22–32)
Calcium: 7.5 mg/dL — ABNORMAL LOW (ref 8.9–10.3)
Chloride: 109 mmol/L (ref 98–111)
Creatinine, Ser: 1.73 mg/dL — ABNORMAL HIGH (ref 0.44–1.00)
GFR, Estimated: 29 mL/min — ABNORMAL LOW (ref >60)
Glucose, Bld: 112 mg/dL — ABNORMAL HIGH (ref 70–99)
Potassium: 2.8 mmol/L — ABNORMAL LOW (ref 3.5–5.1)
Sodium: 140 mmol/L (ref 135–145)

## 2022-06-10 LAB — GLUCOSE, CAPILLARY
Glucose-Capillary: 111 mg/dL — ABNORMAL HIGH (ref 70–99)
Glucose-Capillary: 214 mg/dL — ABNORMAL HIGH (ref 70–99)
Glucose-Capillary: 138 mg/dL — ABNORMAL HIGH (ref 70–99)

## 2022-06-10 MED ORDER — POTASSIUM CHLORIDE CRYS ER 20 MEQ PO TBCR: 20.0000 meq | EXTENDED_RELEASE_TABLET | Freq: Every day | ORAL | Status: DC

## 2022-06-10 MED ORDER — POTASSIUM CHLORIDE CRYS ER 20 MEQ PO TBCR
40.0000 meq | EXTENDED_RELEASE_TABLET | ORAL | Status: AC
  Administered 2022-06-10 (×2): 40 meq via ORAL
  Filled 2022-06-10 (×2): qty 2

## 2022-06-10 NOTE — TOC Transition Note (Signed)
Transition of Care [facility]) - CM/SW Discharge Note   Patient Details  Name: Elaine [name] MRN: [id number] Date of Birth: [birth year]  Transition of Care [facility]) CM/SW Contact:  Bary Castilla, LCSW Phone Number: 06/10/2022, 2:22 PM   Clinical Narrative:    Patient will DC to:?[place] date:?06/10/2022 Family notified:?Tamitha Transport by: Corey Harold   Per MD patient ready for DC to [facility] RN, patient, patient's family, and facility notified of DC. Discharge Summary sent to facility. RN given number for report  [phone]. DC packet on chart. Ambulance transport requested for patient.   CSW signing off.   [place], [place] [phone]    Final next level of care: Skilled Nursing Facility Barriers to Discharge: Barriers Resolved   Patient Goals and CMS Choice        Discharge Placement              Patient chooses bed at: [facility] Patient to be transferred to facility by: [place] Name of family member notified: Tamitha Patient and family notified of of transfer: 06/10/22  Discharge Plan and Services     Post Acute Care Choice: [place]                               Social Determinants of Health (SDOH) Interventions     Readmission Risk Interventions     No data to display

## 2022-06-20 NOTE — Progress Notes (Signed)
Remote ICD transmission.   

## 2022-06-22 ENCOUNTER — Encounter (HOSPITAL_COMMUNITY): Payer: Medicare Other

## 2022-06-26 ENCOUNTER — Ambulatory Visit (INDEPENDENT_AMBULATORY_CARE_PROVIDER_SITE_OTHER): Payer: Medicare Other

## 2022-06-26 DIAGNOSIS — I5022 Chronic systolic (congestive) heart failure: Secondary | ICD-10-CM

## 2022-06-26 DIAGNOSIS — Z95810 Presence of automatic (implantable) cardiac defibrillator: Secondary | ICD-10-CM

## 2022-06-26 NOTE — Progress Notes (Signed)
EPIC Encounter for ICM Monitoring  Patient Name: Elaine [name] is a 81 y.o. female Date: 06/26/2022 Primary Care Physican: Ginger Organ., MD Primary Cardiologist: [place] Electrophysiologist: Cyndi Bender Pacing: 97% 01/10/2022 Weight: 219 lbs 03/15/2022 Weight: 217 lbs 04/17/2022 Weight: 224 lbs 04/26/2022 Weight: 228 lbs                                                            Attempted call to patient and unable to reach. Transmission reviewed.  Pt currently in rehab.   Hospitalized 10/6-10/13   [place] thoracic impedance normal but was suggesting possible fluid accumulation starting 10/14.  Impedance also showing possible fluid accumulation 9/30-10/9.  Difficulty maintaining normal fluid levels.   Prescribed:   Torsemide 20 mg take 3 tablets (60 mg total) by mouth daily.   Potassium 20 mEq take 1 tablet by mouth daily Metolazone 2.5 mg Take only as directed by CHF clinic.   Labs: 06/10/2022 Creatinine 1.73, BUN 43, Potassium 2.8, Sodium 140, GFR 29 06/09/2022 Creatinine 2.39, BUN 57, Potassium 4.0, Sodium 135, GFR 20  06/08/2022 Creatinine 2.19, BUN 62, Potassium 4.0, Sodium 134, GFR 22  06/07/2022 Creatinine 2.21, BUN 61, Potassium 3.8, Sodium 135, GFR 22  06/06/2022 Creatinine 2.30, BUN 65, Potassium 3.5, Sodium 129, GFR 21  06/05/2022 Creatinine 2.25, BUN 62, Potassium 4.1, Sodium 134, GFR 21  06/04/2022 Creatinine 1.91, BUN 50, Potassium 4.0, Sodium 136, GFR 26  A complete set of results can be found in Results Review.   Recommendations:  Unable to reach.     Follow-up plan: ICM clinic phone appointment on 07/10/2022 to recheck fluid levels.   91 day device clinic remote transmission 09/06/2022.     EP/Cardiology Office Visits:   2 week HF clinic hospital follow on 10/26 canceled due to patient going to rehab after 10/13 hospital discharge.  08/09/2022 with HF clinic.   Recall 07/02/2022 with Dr Curt Bears.   Copy of ICM check sent to Dr. Curt Bears.  Will send to Dr  Aundra Dubin for review if patient is reached.   3 month ICM trend: 06/26/2022.    12-14 Month ICM trend:     [name], RN 06/26/2022 12:14 PM

## 2022-07-04 ENCOUNTER — Other Ambulatory Visit: Payer: Self-pay

## 2022-07-04 ENCOUNTER — Ambulatory Visit (INDEPENDENT_AMBULATORY_CARE_PROVIDER_SITE_OTHER): Payer: Medicare Other | Admitting: Internal Medicine

## 2022-07-04 ENCOUNTER — Encounter: Payer: Self-pay | Admitting: Internal Medicine

## 2022-07-04 ENCOUNTER — Telehealth: Payer: Self-pay

## 2022-07-04 VITALS — BP 143/70 | HR 74 | Resp 16 | Ht 62.0 in

## 2022-07-04 DIAGNOSIS — Z950 Presence of cardiac pacemaker: Secondary | ICD-10-CM

## 2022-07-04 DIAGNOSIS — M4620 Osteomyelitis of vertebra, site unspecified: Secondary | ICD-10-CM | POA: Diagnosis not present

## 2022-07-04 DIAGNOSIS — I079 Rheumatic tricuspid valve disease, unspecified: Secondary | ICD-10-CM

## 2022-07-04 NOTE — Patient Instructions (Signed)
See snf handout

## 2022-07-04 NOTE — Progress Notes (Addendum)
[facility]ious Disease  Patient Active Problem List   Diagnosis Date Noted   Bacteremia    Acute on chronic systolic CHF (congestive heart failure) ([place]) 06/03/2022   Cystitis 06/03/2022   Chronic respiratory failure with hypoxia ([place]) 06/03/2022   Endocarditis of tricuspid valve 06/03/2022   Class 3 obesity ([place]) 06/03/2022   Carpal tunnel syndrome, right upper limb 10/18/2020   Carpal tunnel syndrome, left upper limb 41/93/7902   Chronic systolic (congestive) heart failure ([place]) 07/29/2019   Hypothyroidism 06/16/2019   Acute on chronic combined systolic and diastolic CHF (congestive heart failure) ([place]) 06/11/2019   Bilateral leg pain 10/22/2017   Chronic combined systolic and diastolic CHF, NYHA class 2 ([place]) 10/22/2017   Edema of both legs 05/10/2015   Mass of right breast on mammogram 10/06/2014   DOE (dyspnea on exertion) 08/19/2014   Severe obesity (BMI >= 40) ([place]) 04/19/2014   CAD status post CABG 40/97/3532   Metabolic acidosis 99/24/2683    Class: Acute   Acute on chronic renal insufficiency 02/02/2014   Near syncope 02/02/2014   Hypotension 02/02/2014   Rash 02/02/2014   Pruritus 02/02/2014    Class: Chronic   SOB (shortness of breath) 01/07/2014   Coumadin Rx post CABG 11/17/2013   Physical deconditioning 11/05/2013   S/P CABG x 3- 10/23/13 10/23/2013   Permanent atrial fibrillation (HCC) - CHA2DS2-VASc Score 6, on Eliquis 10/20/2013   Left bundle branch block (LBBB) on electrocardiogram 10/20/2013   Left main coronary artery disease 10/20/2013    Class: Hospitalized for   History of acute anterior wall myocardial infarction 10/20/2013   Ischemic cardiomyopathy 10/20/2013   Morbid obesity- BMI 48    Diabetic neuropathy ([place])    Acute kidney injury superimposed on chronic kidney disease ([place])    Post corneal transplant 08/05/2012   Obesity 12/20/2006   Type 2 diabetes mellitus with hyperlipidemia ([place]) 08/06/2006   Hyperlipidemia  with target LDL less than 70 08/06/2006   Essential hypertension 08/06/2006   OSTEOARTHRITIS 08/06/2006      Subjective:    Patient ID: Elaine [name], female    DOB: [birth year], 81 y.o.   MRN: [id number]  Chief Complaint  Patient presents with   Establish Care    HPI:  Elaine [name] is a 81 y.o. female with failed right cornea transplant x2, copd on 3 liters o2, chf/ischemic cardiomyoapthy, pacemaker, here for hospital admission 05/2022 f/u for enterococcus faecalis tv endocarditis with vertebral om  Tee didn't see pacer vegetation/thrombus She was discharged to snf with ampicillin/ceftriaxone  She somehow was put on unasyn/ceftriaxone rather. She is staying at the [facility] care center.  We called the nursing home to fax over labs today 07/04/22  She is feeling more energetic and stronger Chronic dyspnea baseline 3 liters o2 and bilateral LE edema. She does think the edema is a little worse with the iv abx.  Back pain also better She was using fww prior to admission Currently she uses fww but also depends on wheel chair at the nursing home. She is wearing depends She does rehab 5 days a week. She thinks she is about 5-10% of her baseline function   Allergies  Allergen Reactions   Crestor [Rosuvastatin Calcium] Other (See Comments)    Cramps- still taking    Allopurinol Hives   Cephalosporins Rash   Tape Rash    Adhesive tape      Outpatient Medications Prior to Visit  Medication  Sig Dispense Refill   acetaminophen (TYLENOL) 650 MG CR tablet Take 1,300 mg by mouth 2 (two) times daily.     albuterol (VENTOLIN HFA) 108 (90 Base) MCG/ACT inhaler Inhale 2 puffs into the lungs every 6 (six) hours as needed for wheezing or shortness of breath.     ampicillin IVPB Inject 2 g into the vein every 8 (eight) hours. Indication:  Enterococcus faecalis IE  First Dose: Yes Last Day of Therapy:  07/16/2022 Labs - Once weekly:  CBC/D and BMP, Labs - Every other week:  ESR  and CRP Method of administration: Ambulatory Pump (Continuous Infusion) Method of administration may be changed at the discretion of home infusion pharmacist based upon assessment of the patient and/or caregiver's ability to self-administer the medication ordered. 114 Units 0   apixaban (ELIQUIS) 2.5 MG TABS tablet Take 2.5 mg by mouth 2 (two) times daily.     cefTRIAXone (ROCEPHIN) IVPB Inject 2 g into the vein every 12 (twelve) hours. Indication:  Enterococcus faecalis IE  First Dose: Yes Last Day of Therapy:  07/16/2022 Labs - Once weekly:  CBC/D and BMP, Labs - Every other week:  ESR and CRP Method of administration: IV Push Method of administration may be changed at the discretion of home infusion pharmacist based upon assessment of the patient and/or caregiver's ability to self-administer the medication ordered. 74 Units 0   Coenzyme Q10 (COQ-10) 100 MG CAPS Take 100 mg by mouth in the morning and at bedtime.      colchicine 0.6 MG tablet Take 0.6 mg by mouth daily as needed (Gout).      Cranberry 250 MG TABS Take 500 mg by mouth 2 (two) times daily.     Dulaglutide (TRULICITY [place]) Inject 1.5 mg into the skin once a week. Tuesday     ELDERBERRY PO Take 1 tablet by mouth 2 (two) times daily.     ezetimibe (ZETIA) 10 MG tablet Take 10 mg by mouth daily.     ferrous sulfate 325 (65 FE) MG tablet Take 325 mg by mouth every other day.     isosorbide-hydrALAZINE (BIDIL) 20-37.5 MG tablet Take 0.5 tablets by mouth 3 (three) times daily. 135 tablet 3   levothyroxine (SYNTHROID) 112 MCG tablet Take 1 tablet (112 mcg total) by mouth daily before breakfast. 30 tablet 12   metoprolol succinate (TOPROL-XL) 50 MG 24 hr tablet Take 1 tablet (50 mg total) by mouth 2 (two) times daily. Take with or immediately following a meal. 60 tablet 0   Multiple Vitamins-Minerals (MULTI FOR HER 50+ PO) Take 1 tablet by mouth daily.     Multiple Vitamins-Minerals (PRESERVISION AREDS 2) CAPS Take 1 capsule by mouth 2  (two) times daily.     potassium chloride SA (KLOR-CON M) 20 MEQ tablet Take 1 tablet (20 mEq total) by mouth daily.     rosuvastatin (CRESTOR) 5 MG tablet Take 5 mg by mouth every other day.      torsemide (DEMADEX) 20 MG tablet Take 3 tablets (60 mg total) by mouth daily.     umeclidinium-vilanterol (ANORO ELLIPTA) 62.5-25 MCG/INH AEPB Inhale 1 puff into the lungs daily.     No facility-administered medications prior to visit.     Social History   Socioeconomic History   Marital status: Widowed    Spouse name: Not on file   Number of children: Not on file   Years of education: Not on file   Highest education level: Not on file  Occupational  History   Occupation: retired  Tobacco Use   Smoking status: Former    Packs/day: 0.50    Years: 39.00    Total pack years: 19.50    Types: Cigarettes    Quit date: 08/28/1993    Years since quitting: 28.8   Smokeless tobacco: Never  Vaping Use   Vaping Use: Never used  Substance and Sexual Activity   Alcohol use: No    Comment: quit 95   Drug use: No   Sexual activity: Not on file  Other Topics Concern   Not on file  Social History Narrative   Lives with husband and son in [place].  She is currently retired.   Former smoker quit in 1995.   Social Determinants of Health   Financial Resource Strain: Medium Risk (11/25/2019)   Overall Financial Resource Strain (CARDIA)    Difficulty of Paying Living Expenses: Somewhat hard  Food Insecurity: No Food Insecurity (11/25/2019)   Hunger Vital Sign    Worried About Running Out of Food in the Last Year: Never true    Ran Out of Food in the Last Year: Never true  Transportation Needs: No Transportation Needs (11/25/2019)   PRAPARE - Hydrologist (Medical): No    Lack of Transportation (Non-Medical): No  Physical Activity: Not on file  Stress: Not on file  Social Connections: Not on file  Intimate Partner Violence: Not on file      Review of Systems      Objective:    BP (!) 143/70   Pulse 74   Resp 16   Ht 5' 2" (1.575 m)   SpO2 93% Comment: 3 lirt oxygen  BMI 42.94 kg/m  Nursing note and vital signs reviewed.  Physical Exam     General/constitutional: no distress, pleasant; in wheel chair HEENT: Normocephalic, PER, old right conj scarring CV: rrr no mrg Lungs: mild exp wheeze; normal respiratory effort Abd: Soft, Nontender Ext: 2-3+ edema to bilateral LE Skin: No Rash Neuro: bilateral LE strength 3-4/5 MSK: t8-10 mild-moderate tenderness   Central line presence: RUE picc site no purulence/erythema/tenderness    Labs: Snf labs received post 07/04/22 clinic visit (same day though) 10/31 cbc 5.5/8.6//193; cr 1.63 10/17 cbc 6.4/8.3/283; cr 1.69  Micro:  Serology:  Imaging: 10/08 mri t spine 1. Markedly limited exam due to the degree of motion artifact and lack of IV contrast. 2. Within this limitation, there are findings that are worrisome for discitis/osteomyelitis at T8-T9 with a small fluid collection along the left lateral aspect of the T8-T9 vertebral body level measuring approximately 1.9 x 1.3 cm. No definite evidence of epidural abscess, but assessment is markedly limited due to the degree of motion artifact. Recommend further evaluation with a contrast enhanced exam if the patient is able to limit movement at time of imaging. 3. Small bilateral pleural effusions     IR consulted for evaluation for possible aspiration/biopsy of disc/fluid collection.     Assessment & Plan:   Problem List Items Addressed This Visit       Cardiovascular and Mediastinum   Endocarditis of tricuspid valve   Other Visit Diagnoses     Vertebral osteomyelitis ([place])    -  Primary   Relevant Orders   MR THORACIC SPINE WO CONTRAST   Presence of cardiac pacemaker             No orders of the defined types were placed in this encounter.    Discussed with  nursing home to change amp-sulb to amp alone 2 gram  q8hrs; continue ceftriaxone 2 gram q12 Those abx go till 07/16/22 then transition to amox 1 gram q12hrs to adjust for renal function Mri t spine 2-3 weeks See me early 07/2022  See pcp for chf / edema management   Follow-up: Return in about 5 weeks (around 08/08/2022).   I have spent a total of 30 minutes of face-to-face and non-face-to-face time, excluding clinical staff time, preparing to see patient, ordering tests and/or medications, and provide counseling the patient    Jabier Mutton, [facility] 07/04/2022, 9:04 AM

## 2022-07-04 NOTE — Telephone Encounter (Signed)
Per Dr Gale Journey OK tp [place] PICC on 07/16/2022. [place] of this plan. Will start oral after/ Spoke to [place] at [facility].

## 2022-07-05 ENCOUNTER — Telehealth: Payer: Self-pay

## 2022-07-05 NOTE — Telephone Encounter (Signed)
Elaine [name] from [facility] called to report patient missed 3 doses of her Ampicillin. Advised per Dr Gale Journey can extend out one more day both the Ceftriaxone and the Ampicillin. Advised they must be taken together.

## 2022-07-10 ENCOUNTER — Ambulatory Visit (INDEPENDENT_AMBULATORY_CARE_PROVIDER_SITE_OTHER): Payer: Medicare Other

## 2022-07-10 DIAGNOSIS — I5022 Chronic systolic (congestive) heart failure: Secondary | ICD-10-CM

## 2022-07-10 DIAGNOSIS — Z95810 Presence of automatic (implantable) cardiac defibrillator: Secondary | ICD-10-CM

## 2022-07-11 ENCOUNTER — Telehealth: Payer: Self-pay

## 2022-07-11 NOTE — Progress Notes (Signed)
EPIC Encounter for ICM Monitoring  Patient Name: Elaine [name] is a 81 y.o. female Date: 07/11/2022 Primary Care Physican: Ginger Organ., MD Primary Cardiologist: [place] Electrophysiologist: Cyndi Bender Pacing: 97% 01/10/2022 Weight: 219 lbs 03/15/2022 Weight: 217 lbs 04/17/2022 Weight: 224 lbs 04/26/2022 Weight: 228 lbs                                                            Attempted call to patient and unable to reach. Transmission reviewed.     Hospitalized 10/6-10/13   CorVue thoracic impedance normal but was suggesting possible fluid accumulation starting 10/14.  Difficulty maintaining normal fluid levels since 9/11.   Prescribed:   Torsemide 20 mg take 3 tablets (60 mg total) by mouth daily.   Potassium 20 mEq take 1 tablet by mouth daily Metolazone 2.5 mg Take only as directed by CHF clinic.   Labs: 06/10/2022 Creatinine 1.73, BUN 43, Potassium 2.8, Sodium 140, GFR 29 06/09/2022 Creatinine 2.39, BUN 57, Potassium 4.0, Sodium 135, GFR 20  06/08/2022 Creatinine 2.19, BUN 62, Potassium 4.0, Sodium 134, GFR 22  06/07/2022 Creatinine 2.21, BUN 61, Potassium 3.8, Sodium 135, GFR 22  06/06/2022 Creatinine 2.30, BUN 65, Potassium 3.5, Sodium 129, GFR 21  06/05/2022 Creatinine 2.25, BUN 62, Potassium 4.1, Sodium 134, GFR 21  06/04/2022 Creatinine 1.91, BUN 50, Potassium 4.0, Sodium 136, GFR 26  A complete set of results can be found in Results Review.   Recommendations:  Unable to reach.     Follow-up plan: ICM clinic phone appointment on 07/24/2022 to recheck fluid levels.   91 day device clinic remote transmission 09/06/2022.     EP/Cardiology Office Visits:  08/09/2022 with HF clinic.  08/22/2022 with Coletta Memos, NP.   Recall 07/02/2022 with Dr Curt Bears.   Copy of ICM check sent to Dr. Curt Bears.  Will send to Dr Aundra Dubin for review if patient is reached.   3 month ICM trend: 07/10/2022.    12-14 Month ICM trend:     [name], RN 07/11/2022 2:54  PM

## 2022-07-11 NOTE — Telephone Encounter (Signed)
Remote ICM transmission received.  Attempted call to patient regarding ICM remote transmission and no answer.  

## 2022-07-24 ENCOUNTER — Ambulatory Visit (INDEPENDENT_AMBULATORY_CARE_PROVIDER_SITE_OTHER): Payer: Medicare Other

## 2022-07-24 DIAGNOSIS — Z95810 Presence of automatic (implantable) cardiac defibrillator: Secondary | ICD-10-CM

## 2022-07-24 DIAGNOSIS — I5022 Chronic systolic (congestive) heart failure: Secondary | ICD-10-CM

## 2022-07-26 NOTE — Progress Notes (Signed)
EPIC Encounter for ICM Monitoring  Patient Name: Elaine [name] is a 81 y.o. female Date: 07/26/2022 Primary Care Physican: Ginger Organ., MD Primary Cardiologist: [place] Electrophysiologist: Cyndi Bender Pacing: 97% 01/10/2022 Weight: 219 lbs 03/15/2022 Weight: 217 lbs 04/17/2022 Weight: 224 lbs 04/26/2022 Weight: 228 lbs                                                            Transmission reviewed.        CorVue thoracic impedance suggesting fluid levels returned to normal.  Difficulty maintaining normal fluid levels since 9/11.   Prescribed:   Torsemide 20 mg take 3 tablets (60 mg total) by mouth daily.   Potassium 20 mEq take 1 tablet by mouth daily Metolazone 2.5 mg Take only as directed by CHF clinic.   Labs: 06/10/2022 Creatinine 1.73, BUN 43, Potassium 2.8, Sodium 140, GFR 29 06/09/2022 Creatinine 2.39, BUN 57, Potassium 4.0, Sodium 135, GFR 20  06/08/2022 Creatinine 2.19, BUN 62, Potassium 4.0, Sodium 134, GFR 22  06/07/2022 Creatinine 2.21, BUN 61, Potassium 3.8, Sodium 135, GFR 22  06/06/2022 Creatinine 2.30, BUN 65, Potassium 3.5, Sodium 129, GFR 21  06/05/2022 Creatinine 2.25, BUN 62, Potassium 4.1, Sodium 134, GFR 21  06/04/2022 Creatinine 1.91, BUN 50, Potassium 4.0, Sodium 136, GFR 26  A complete set of results can be found in Results Review.   Recommendations:  No changes.     Follow-up plan: ICM clinic phone appointment on 07/31/2022.   91 day device clinic remote transmission 09/06/2022.     EP/Cardiology Office Visits:  08/09/2022 with HF clinic.  08/22/2022 with Coletta Memos, NP.   Recall 07/02/2022 with Dr Curt Bears.   Copy of ICM check sent to Dr. Curt Bears.   3 month ICM trend: 07/24/2022.    12-14 Month ICM trend:     [name], RN 07/26/2022 4:39 PM

## 2022-08-01 ENCOUNTER — Ambulatory Visit: Payer: Medicare Other | Admitting: Internal Medicine

## 2022-08-02 NOTE — Progress Notes (Addendum)
No ICM remote transmission received for 07/31/2022 and next ICM transmission scheduled for 08/07/2022.

## 2022-08-07 NOTE — Progress Notes (Signed)
No ICM remote transmission received for 08/07/2022 and next ICM transmission scheduled for 09/11/2022.

## 2022-08-08 ENCOUNTER — Ambulatory Visit (INDEPENDENT_AMBULATORY_CARE_PROVIDER_SITE_OTHER): Payer: Medicare Other | Admitting: Internal Medicine

## 2022-08-08 ENCOUNTER — Other Ambulatory Visit: Payer: Self-pay

## 2022-08-08 ENCOUNTER — Encounter: Payer: Self-pay | Admitting: Internal Medicine

## 2022-08-08 VITALS — BP 113/69 | HR 69 | Temp 97.8°F

## 2022-08-08 DIAGNOSIS — A498 Other bacterial infections of unspecified site: Secondary | ICD-10-CM | POA: Diagnosis not present

## 2022-08-08 DIAGNOSIS — M869 Osteomyelitis, unspecified: Secondary | ICD-10-CM

## 2022-08-08 DIAGNOSIS — I330 Acute and subacute infective endocarditis: Secondary | ICD-10-CM | POA: Diagnosis not present

## 2022-08-08 DIAGNOSIS — Z950 Presence of cardiac pacemaker: Secondary | ICD-10-CM | POA: Diagnosis not present

## 2022-08-08 MED ORDER — AMOXICILLIN 500 MG PO CAPS: 1000.0000 mg | ORAL_CAPSULE | Freq: Two times a day (BID) | ORAL | 11 refills | Status: DC

## 2022-08-08 NOTE — Patient Instructions (Signed)
Pick up amoxicillin today -- take 1000 mg twice a day (2 tablet in morning and 2 in evening)   Labs today   Mri thoracic spine on 12/14 (check with Joycelyn Schmid at the front desk)   See me in around 6 weeks

## 2022-08-08 NOTE — Progress Notes (Signed)
[facility]  Patient Active Problem List   Diagnosis Date Noted   Bacteremia    Acute on chronic systolic CHF (congestive heart failure) ([place]) 06/03/2022   Cystitis 06/03/2022   Chronic respiratory failure with hypoxia ([place]) 06/03/2022   Endocarditis of tricuspid valve 06/03/2022   Class 3 obesity ([place]) 06/03/2022   Carpal tunnel syndrome, right upper limb 10/18/2020   Carpal tunnel syndrome, left upper limb 66/44/0347   Chronic systolic (congestive) heart failure ([place]) 07/29/2019   Hypothyroidism 06/16/2019   Acute on chronic combined systolic and diastolic CHF (congestive heart failure) ([place]) 06/11/2019   Bilateral leg pain 10/22/2017   Chronic combined systolic and diastolic CHF, NYHA class 2 ([place]) 10/22/2017   Edema of both legs 05/10/2015   Mass of right breast on mammogram 10/06/2014   DOE (dyspnea on exertion) 08/19/2014   Severe obesity (BMI >= 40) ([place]) 04/19/2014   CAD status post CABG 42/59/5638   Metabolic acidosis 75/64/3329    Class: Acute   Acute on chronic renal insufficiency 02/02/2014   Near syncope 02/02/2014   Hypotension 02/02/2014   Rash 02/02/2014   Pruritus 02/02/2014    Class: Chronic   SOB (shortness of breath) 01/07/2014   Coumadin Rx post CABG 11/17/2013   Physical deconditioning 11/05/2013   S/P CABG x 3- 10/23/13 10/23/2013   Permanent atrial fibrillation (HCC) - CHA2DS2-VASc Score 6, on Eliquis 10/20/2013   Left bundle branch block (LBBB) on electrocardiogram 10/20/2013   Left main coronary artery disease 10/20/2013    Class: Hospitalized for   History of acute anterior wall myocardial infarction 10/20/2013   Ischemic cardiomyopathy 10/20/2013   Morbid obesity- BMI 48    Diabetic neuropathy ([place])    Acute kidney injury superimposed on chronic kidney disease ([place])    Post corneal transplant 08/05/2012   Obesity 12/20/2006   Type 2 diabetes mellitus with hyperlipidemia ([place]) 08/06/2006   Hyperlipidemia  with target LDL less than 70 08/06/2006   Essential hypertension 08/06/2006   OSTEOARTHRITIS 08/06/2006      Subjective:    Patient ID: Elaine [name], female    DOB: [birth year], 81 y.o.   MRN: [id number]  Chief Complaint  Patient presents with   Follow-up    HPI:  Elaine [name] is a 81 y.o. female with failed right cornea transplant x2, copd on 3 liters o2, chf/ischemic cardiomyoapthy, pacemaker/icd, here for hospital admission 05/2022 f/u for enterococcus faecalis tv endocarditis with vertebral om  Tee didn't see pacer vegetation/thrombus She was discharged to snf with ampicillin/ceftriaxone  She somehow was put on unasyn/ceftriaxone rather. She is staying at the [facility] care center.  We called the nursing home to fax over labs today 07/04/22  She is feeling more energetic and stronger Chronic dyspnea baseline 3 liters o2 and bilateral LE edema. She does think the edema is a little worse with the iv abx.  Back pain also better She was using fww prior to admission Currently she uses fww but also depends on wheel chair at the nursing home. She is wearing depends She does rehab 5 days a week. She thinks she is about 5-10% of her baseline function   08/08/22 id clinic f/u Discharged from snf on 07/31/22. Finished iv abx 06/2022. Doesn't know and has no rx for amoxicillin abx She feels well -- good appetite, no fever, chill, rash Chronic bilateral knee pain No back pain outside of now and then "twinge" She is getting in-home  rehab now pt/ot She has a thoracic mri on 12/14 that she is not awared about -- there was a fluid collection lumbar area so I had wanted to f/u on that  Can walk with help 6-8 feet currently No complaint today   Allergies  Allergen Reactions   Crestor [Rosuvastatin Calcium] Other (See Comments)    Cramps- still taking    Allopurinol Hives   Cephalosporins Rash   Tape Rash    Adhesive tape      Outpatient Medications Prior to Visit   Medication Sig Dispense Refill   acetaminophen (TYLENOL) 650 MG CR tablet Take 1,300 mg by mouth 2 (two) times daily.     albuterol (VENTOLIN HFA) 108 (90 Base) MCG/ACT inhaler Inhale 2 puffs into the lungs every 6 (six) hours as needed for wheezing or shortness of breath.     apixaban (ELIQUIS) 2.5 MG TABS tablet Take 2.5 mg by mouth 2 (two) times daily.     Coenzyme Q10 (COQ-10) 100 MG CAPS Take 100 mg by mouth in the morning and at bedtime.      colchicine 0.6 MG tablet Take 0.6 mg by mouth daily as needed (Gout).      Cranberry 250 MG TABS Take 500 mg by mouth 2 (two) times daily.     Dulaglutide (TRULICITY [place]) Inject 1.5 mg into the skin once a week. Tuesday     ELDERBERRY PO Take 1 tablet by mouth 2 (two) times daily.     ezetimibe (ZETIA) 10 MG tablet Take 10 mg by mouth daily.     ferrous sulfate 325 (65 FE) MG tablet Take 325 mg by mouth every other day.     isosorbide-hydrALAZINE (BIDIL) 20-37.5 MG tablet Take 0.5 tablets by mouth 3 (three) times daily. 135 tablet 3   levothyroxine (SYNTHROID) 112 MCG tablet Take 1 tablet (112 mcg total) by mouth daily before breakfast. 30 tablet 12   metoprolol succinate (TOPROL-XL) 50 MG 24 hr tablet Take 1 tablet (50 mg total) by mouth 2 (two) times daily. Take with or immediately following a meal. 60 tablet 0   Multiple Vitamins-Minerals (MULTI FOR HER 50+ PO) Take 1 tablet by mouth daily.     Multiple Vitamins-Minerals (PRESERVISION AREDS 2) CAPS Take 1 capsule by mouth 2 (two) times daily.     potassium chloride SA (KLOR-CON M) 20 MEQ tablet Take 1 tablet (20 mEq total) by mouth daily.     rosuvastatin (CRESTOR) 5 MG tablet Take 5 mg by mouth every other day.      torsemide (DEMADEX) 20 MG tablet Take 3 tablets (60 mg total) by mouth daily.     umeclidinium-vilanterol (ANORO ELLIPTA) 62.5-25 MCG/INH AEPB Inhale 1 puff into the lungs daily.     No facility-administered medications prior to visit.     Social History   Socioeconomic History    Marital status: Widowed    Spouse name: Not on file   Number of children: Not on file   Years of education: Not on file   Highest education level: Not on file  Occupational History   Occupation: retired  Tobacco Use   Smoking status: Former    Packs/day: 0.50    Years: 39.00    Total pack years: 19.50    Types: Cigarettes    Quit date: 08/28/1993    Years since quitting: 28.9   Smokeless tobacco: Never  Vaping Use   Vaping Use: Never used  Substance and Sexual Activity   Alcohol use: No  Comment: quit 95   Drug use: No   Sexual activity: Not on file  Other Topics Concern   Not on file  Social History Narrative   Lives with husband and son in [place].  She is currently retired.   Former smoker quit in 1995.   Social Determinants of Health   Financial Resource Strain: Medium Risk (11/25/2019)   Overall Financial Resource Strain (CARDIA)    Difficulty of Paying Living Expenses: Somewhat hard  Food Insecurity: No Food Insecurity (11/25/2019)   Hunger Vital Sign    Worried About Running Out of Food in the Last Year: Never true    Ran Out of Food in the Last Year: Never true  Transportation Needs: No Transportation Needs (11/25/2019)   PRAPARE - Hydrologist (Medical): No    Lack of Transportation (Non-Medical): No  Physical Activity: Not on file  Stress: Not on file  Social Connections: Not on file  Intimate Partner Violence: Not on file      Review of Systems    All other ros negative  Objective:    BP 113/69   Pulse 69   Temp 97.8 F (36.6 C) (Oral)   SpO2 95%  Nursing note and vital signs reviewed.  Physical Exam     General/constitutional: no distress, pleasant; in wheel chair HEENT: Normocephalic, PER, old right conj scarring CV: rrr no mrg Lungs: normal respiratory effort Abd: Soft, Nontender Ext: 2-3+ edema to bilateral LE Skin: No Rash Neuro: bilateral proximal LE strength 3-4/5; left a little stronger MSK:  t8-10 very mild 1/10 discomfort   Central line presence: RUE picc site no purulence/erythema/tenderness    Labs: Snf labs received post 07/04/22 clinic visit (same day though) 10/31 cbc 5.5/8.6//193; cr 1.63 10/17 cbc 6.4/8.3/283; cr 1.69  Micro:  Serology:  Imaging: 10/08 mri t spine 1. Markedly limited exam due to the degree of motion artifact and lack of IV contrast. 2. Within this limitation, there are findings that are worrisome for discitis/osteomyelitis at T8-T9 with a small fluid collection along the left lateral aspect of the T8-T9 vertebral body level measuring approximately 1.9 x 1.3 cm. No definite evidence of epidural abscess, but assessment is markedly limited due to the degree of motion artifact. Recommend further evaluation with a contrast enhanced exam if the patient is able to limit movement at time of imaging. 3. Small bilateral pleural effusions     IR consulted for evaluation for possible aspiration/biopsy of disc/fluid collection.     Assessment & Plan:   Problem List Items Addressed This Visit   None Visit Diagnoses     Enterococcus faecalis infection    -  Primary   Relevant Orders   C-reactive protein   CBC w/Diff   COMPLETE METABOLIC PANEL WITH GFR   Acute bacterial endocarditis       Relevant Orders   C-reactive protein   CBC w/Diff   COMPLETE METABOLIC PANEL WITH GFR   Osteomyelitis, unspecified site, unspecified type (HCC)       Relevant Orders   C-reactive protein   CBC w/Diff   COMPLETE METABOLIC PANEL WITH GFR   Pacemaker       Relevant Orders   C-reactive protein   CBC w/Diff   COMPLETE METABOLIC PANEL WITH GFR        81 y.o. female with failed right cornea transplant x2, copd on 3 liters o2, chf/ischemic cardiomyoapthy, pacemaker/icd, here for hospital admission 05/2022 f/u for enterococcus faecalis tv endocarditis  with vertebral om  Discussed with nursing home to change amp-sulb to amp alone 2 gram q8hrs; continue  ceftriaxone 2 gram q12 Those abx go till 07/16/22 then transition to amox 1 gram q12hrs to adjust for renal function Mri t spine 2-3 weeks See me early 07/2022  See pcp for chf / edema management  ----------- 08/08/22 id clinic assessment Hasn't started amox maintenant pacer infection tx yet since 11/19, after discharged from snf 12/4. No sign of sepsis Will rx today 1 gram twice a day adjusted for renal function Remind her to do mri t-spine 12/14 F/u with me in 4-6 weeks  F/u pcp for chronic medical problems care   Follow-up: Return in about 6 weeks (around 09/19/2022).  I have spent a total of 25 minutes of face-to-face and non-face-to-face time, excluding clinical staff time, preparing to see patient, ordering tests and/or medications, and provide counseling the patient    Jabier Mutton, [facility] 08/08/2022, 10:05 AM

## 2022-08-09 ENCOUNTER — Ambulatory Visit (HOSPITAL_COMMUNITY)
Admission: RE | Admit: 2022-08-09 | Discharge: 2022-08-09 | Disposition: A | Discharge status: Home / Self Care | Payer: Medicare Other | Source: Ambulatory Visit | Attending: Cardiology | Admitting: Cardiology

## 2022-08-09 ENCOUNTER — Encounter (HOSPITAL_COMMUNITY): Payer: Self-pay | Admitting: Cardiology

## 2022-08-09 VITALS — BP 100/60 | HR 73

## 2022-08-09 DIAGNOSIS — I4819 Other persistent atrial fibrillation: Secondary | ICD-10-CM | POA: Diagnosis not present

## 2022-08-09 DIAGNOSIS — N184 Chronic kidney disease, stage 4 (severe): Secondary | ICD-10-CM | POA: Insufficient documentation

## 2022-08-09 DIAGNOSIS — Z95810 Presence of automatic (implantable) cardiac defibrillator: Secondary | ICD-10-CM | POA: Insufficient documentation

## 2022-08-09 DIAGNOSIS — N390 Urinary tract infection, site not specified: Secondary | ICD-10-CM | POA: Diagnosis not present

## 2022-08-09 DIAGNOSIS — I2510 Atherosclerotic heart disease of native coronary artery without angina pectoris: Secondary | ICD-10-CM | POA: Insufficient documentation

## 2022-08-09 DIAGNOSIS — Z951 Presence of aortocoronary bypass graft: Secondary | ICD-10-CM | POA: Diagnosis not present

## 2022-08-09 DIAGNOSIS — I5042 Chronic combined systolic (congestive) and diastolic (congestive) heart failure: Secondary | ICD-10-CM

## 2022-08-09 DIAGNOSIS — I079 Rheumatic tricuspid valve disease, unspecified: Secondary | ICD-10-CM | POA: Insufficient documentation

## 2022-08-09 DIAGNOSIS — E1122 Type 2 diabetes mellitus with diabetic chronic kidney disease: Secondary | ICD-10-CM | POA: Insufficient documentation

## 2022-08-09 DIAGNOSIS — Z79899 Other long term (current) drug therapy: Secondary | ICD-10-CM | POA: Insufficient documentation

## 2022-08-09 DIAGNOSIS — Z87891 Personal history of nicotine dependence: Secondary | ICD-10-CM | POA: Insufficient documentation

## 2022-08-09 DIAGNOSIS — I255 Ischemic cardiomyopathy: Secondary | ICD-10-CM | POA: Insufficient documentation

## 2022-08-09 DIAGNOSIS — Z7901 Long term (current) use of anticoagulants: Secondary | ICD-10-CM | POA: Diagnosis not present

## 2022-08-09 DIAGNOSIS — J449 Chronic obstructive pulmonary disease, unspecified: Secondary | ICD-10-CM | POA: Diagnosis not present

## 2022-08-09 DIAGNOSIS — I5022 Chronic systolic (congestive) heart failure: Secondary | ICD-10-CM | POA: Insufficient documentation

## 2022-08-09 DIAGNOSIS — B952 Enterococcus as the cause of diseases classified elsewhere: Secondary | ICD-10-CM | POA: Insufficient documentation

## 2022-08-09 LAB — CBC WITH DIFFERENTIAL/PLATELET
Absolute Monocytes: 456 {cells}/uL (ref 200–950)
Basophils Absolute: 42 {cells}/uL (ref 0–200)
Basophils Relative: 0.7 %
Eosinophils Absolute: 174 {cells}/uL (ref 15–500)
Eosinophils Relative: 2.9 %
HCT: 33 % — ABNORMAL LOW (ref 35.0–45.0)
Hemoglobin: 10.4 g/dL — ABNORMAL LOW (ref 11.7–15.5)
Lymphs Abs: 2004 {cells}/uL (ref 850–3900)
MCH: 27.5 pg (ref 27.0–33.0)
MCHC: 31.5 g/dL — ABNORMAL LOW (ref 32.0–36.0)
MCV: 87.3 fL (ref 80.0–100.0)
MPV: 11.2 fL (ref 7.5–12.5)
Monocytes Relative: 7.6 %
Neutro Abs: 3324 {cells}/uL (ref 1500–7800)
Neutrophils Relative %: 55.4 %
Platelets: 254 Thousand/uL (ref 140–400)
RBC: 3.78 Million/uL — ABNORMAL LOW (ref 3.80–5.10)
RDW: 15.9 % — ABNORMAL HIGH (ref 11.0–15.0)
Total Lymphocyte: 33.4 %
WBC: 6 Thousand/uL (ref 3.8–10.8)

## 2022-08-09 LAB — COMPLETE METABOLIC PANEL WITHOUT GFR
AG Ratio: 1.2 (calc) (ref 1.0–2.5)
ALT: 18 U/L (ref 6–29)
AST: 25 U/L (ref 10–35)
Albumin: 3.8 g/dL (ref 3.6–5.1)
Alkaline phosphatase (APISO): 99 U/L (ref 37–153)
BUN/Creatinine Ratio: 30 (calc) — ABNORMAL HIGH (ref 6–22)
BUN: 64 mg/dL — ABNORMAL HIGH (ref 7–25)
CO2: 30 mmol/L (ref 20–32)
Calcium: 9.9 mg/dL (ref 8.6–10.4)
Chloride: 97 mmol/L — ABNORMAL LOW (ref 98–110)
Creat: 2.14 mg/dL — ABNORMAL HIGH (ref 0.60–0.95)
Globulin: 3.3 g/dL (ref 1.9–3.7)
Glucose, Bld: 279 mg/dL — ABNORMAL HIGH (ref 65–99)
Potassium: 4 mmol/L (ref 3.5–5.3)
Sodium: 140 mmol/L (ref 135–146)
Total Bilirubin: 0.8 mg/dL (ref 0.2–1.2)
Total Protein: 7.1 g/dL (ref 6.1–8.1)
eGFR: 23 mL/min/1.73m2 — ABNORMAL LOW

## 2022-08-09 LAB — C-REACTIVE PROTEIN: CRP: 13.8 mg/L — ABNORMAL HIGH (ref <8.0)

## 2022-08-09 MED ORDER — TORSEMIDE 20 MG PO TABS: 80.0000 mg | ORAL_TABLET | Freq: Every day | ORAL | 3 refills | Status: DC

## 2022-08-09 NOTE — Patient Instructions (Signed)
INCREASE Torsemide to '80mg'$  daily.  Blood work in 10 days  Your physician recommends that you schedule a follow-up appointment in: 1 month  If you have any questions or concerns before your next appointment please send [place] a message through [place] or call our office at [phone].    TO LEAVE A MESSAGE FOR THE NURSE SELECT OPTION 2, PLEASE LEAVE A MESSAGE INCLUDING: YOUR NAME DATE OF BIRTH CALL BACK NUMBER REASON FOR CALL**this is important as we prioritize the call backs  YOU WILL RECEIVE A CALL BACK THE SAME DAY AS LONG AS YOU CALL BEFORE 4:00 PM  At the [facility], you and your health needs are our priority. As part of our continuing mission to provide you with exceptional heart care, we have created designated Provider Care Teams. These Care Teams include your primary Cardiologist (physician) and Advanced Practice Providers (APPs- Physician Assistants and Nurse Practitioners) who all work together to provide you with the care you need, when you need it.   You may see any of the following providers on your designated Care Team at your next follow up: Dr Glori Bickers Dr Loralie Champagne Dr. Roxana Hires, NP Lyda Jester, [place] [facility] [place], [place] Forestine Na, NP Audry Riles, PharmD   Please be sure to bring in all your medications bottles to every appointment.

## 2022-08-09 NOTE — Progress Notes (Signed)
ID:  Elaine [name], DOB March 26, 1941, MRN [id number]  Provider location: [facility] Type of Visit:  Heart Failure Follow up  PCP:  Marton Redwood, MD  Primary Cardiologist:  Glenetta Hew, MD EP: Dr Tomasa Blase HF Cardiologist: Dr. Aundra Dubin   HPI: Elaine [name] is a 80 y.o. with history of CAD s/p CABG, COPD, CKD stage 3, and chronic systolic systolic CHF/ischemic cardiomyopathy.  She was referred by Dr. Ellyn Hack for evaluation of CHF.  Patient was admitted in 2/15 with atrial fibrillation/RVR and chest pain.  Cath showed severe 3VD and she had CABG x 3.  Echo in 2/15 showed EF down to 20-25%. In 2018, it appears that she went back into atrial fibrillation and has remained in atrial fibrillation persistently.  DCCV was not attempted.   Most recent echo in 10/20 showed EF < 20% with normal RV.  She had a St Jude CRT-D device implanted in 11/20.  She says that she feels "90%" better with CRT.  She is followed by Dr. Hollie Salk with nephrology.   She has been in atrial fibrillation persistently, I attempted DCCV while on amiodarone but she did not convert.  Therefore, I stopped amiodarone.    Echo in 3/21 showed EF 20% with diffuse hypokinesis, mildly dilated RV with mildly decreased systolic function, PASP 49 mmHg, dilated IVC.  In 9/21, she had AV nodal ablation.    She stopped empagliflozin due to UTIs and yeast infections.   Echo 12/22 EF 20-25%, mildly decreased RV function.   She was admitted in 10/23 with E faecalis UTI.  She was found to have T8/9 discitis/osteomyelitis.  TEE showed small TV vegetation that did not appear to involve her device.  Other findings were EF 30%, PASP 60 mmHg, mild MR, moderate-severe TR.  EP thought she was too high risk for device extraction.  She was treated with 6 wks of IV ampicillin and ceftriaxone.  She is now on amoxicillin.   Today she returns for HF followup.  She is now home from SNF.  She was not pleased by her SNF experience.  She is  doing PT.  She is mostly confined to the wheelchair though she can walk about 10 feet on her own.  She gets fatigued easily though she says she does not get short of breath.  She will be getting a repeat MRI on her back tomorrow.    St Jude device interrogation: 97% BiV pacing, no VT, stable thoracic impedance.   Labs (2/21): K 4.5, creatinine 2.08 => 2.99, hgb 12.8 Labs (7/21): K 4, creatinine 2.3, TSH normal Labs (12/21): K 4, creatinine 2.31, BNP 671 Labs (1/22): K 4.1, creatinine 2.48  Labs (3/22): LDL 77 Labs (6/22): K 4.9, creatinine 2.26 Labs (10/22): K 4.8, creatinine 2.41 Labs (12/22): LDL 62, hgb 13.5 Labs (2/23): K 4.4, creatinine 2.28 Labs (11/11/21): K 5.8  Labs (3/23): BUN 52 creatinine 2.5 K 4.7  Labs (5/23): K 4.2, creatinine 2.17  Labs (8/23): K 3.7, creatinine 2.11, hgb 12.5 Labs (12/23): K 4, creatinine 2.14, hgb 10.4  PMH: 1. CAD: s/p CABG in 2/15 with LIMA-LAD, SVG-OM2, SVG-PLV.  2. LBBB: Chronic.  3. Atrial fibrillation: Persistent since 2018.  - LA appendage clip with CABG.  4. H/o aspiration PNA 5. Type 2 diabetes 6. COPD 7. CKD stage 4 8. Chronic systolic CHF: Ischemic cardiomyopathy.  She has a St Jude BiV ICD.  - Echo (2/15): EF 20-25%.  - Echo (5/15): EF 40-45% - Echo (  10/20): EF <20%, mild LVH, normal RV size and systolic function.  - Echo (3/21): EF 20% with diffuse hypokinesis, mildly dilated RV with mildly decreased systolic function, PASP 49 mmHg, dilated IVC.  - AV nodal ablation - Echo (12/22): EF 20-25%, mildly decreased RV function. - TEE (10/23): Small TV vegetation that did not appear to involve her device.  Other findings were EF 30%, PASP 60 mmHg, mild MR, moderate-severe TR 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 She also had T8/T9 discitis/osteomyelitis.    Social History   Socioeconomic History   Marital status: Widowed    Spouse name: Not on file   Number of children: Not on file   Years of education:  Not on file   Highest education level: Not on file  Occupational History   Occupation: retired  Tobacco Use   Smoking status: Former    Packs/day: 0.50    Years: 39.00    Total pack years: 19.50    Types: Cigarettes    Quit date: 08/28/1993    Years since quitting: 28.9   Smokeless tobacco: Never  Vaping Use   Vaping Use: Never used  Substance and Sexual Activity   Alcohol use: No    Comment: quit 95   Drug use: No   Sexual activity: Not on file  Other Topics Concern   Not on file  Social History Narrative   Lives with husband and son in [place].  She is currently retired.   Former smoker quit in 1995.   Social Determinants of Health   Financial Resource Strain: Medium Risk (11/25/2019)   Overall Financial Resource Strain (CARDIA)    Difficulty of Paying Living Expenses: Somewhat hard  Food Insecurity: No Food Insecurity (11/25/2019)   Hunger Vital Sign    Worried About Running Out of Food in the Last Year: Never true    Ran Out of Food in the Last Year: Never true  Transportation Needs: No Transportation Needs (11/25/2019)   PRAPARE - Hydrologist (Medical): No    Lack of Transportation (Non-Medical): No  Physical Activity: Not on file  Stress: Not on file  Social Connections: Not on file  Intimate Partner Violence: Not on file   Family History  Adopted: Yes  Problem Relation Age of Onset   Other Mother    Hypertension Mother    Colon cancer Neg Hx    Esophageal cancer Neg Hx    Rectal cancer Neg Hx    Stomach cancer Neg Hx    Current Outpatient Medications  Medication Sig Dispense Refill   acetaminophen (TYLENOL) 650 MG CR tablet Take 1,300 mg by mouth 2 (two) times daily.     albuterol (VENTOLIN HFA) 108 (90 Base) MCG/ACT inhaler Inhale 2 puffs into the lungs every 6 (six) hours as needed for wheezing or shortness of breath.     amoxicillin (AMOXIL) 500 MG capsule Take 2 capsules (1,000 mg total) by mouth 2 (two) times daily. 120  capsule 11   apixaban (ELIQUIS) 2.5 MG TABS tablet Take 2.5 mg by mouth 2 (two) times daily.     Coenzyme Q10 (COQ-10) 100 MG CAPS Take 100 mg by mouth in the morning and at bedtime.      colchicine 0.6 MG tablet Take 0.6 mg by mouth daily as needed (Gout).      Cranberry 250 MG TABS Take 500 mg by mouth 2 (two) times daily.     Dulaglutide (TRULICITY [place]) Inject 1.5  mg into the skin once a week. Tuesday     ELDERBERRY PO Take 1 tablet by mouth 2 (two) times daily.     ezetimibe (ZETIA) 10 MG tablet Take 10 mg by mouth daily.     ferrous sulfate 325 (65 FE) MG tablet Take 325 mg by mouth every other day.     isosorbide-hydrALAZINE (BIDIL) 20-37.5 MG tablet Take 0.5 tablets by mouth 3 (three) times daily. 135 tablet 3   levothyroxine (SYNTHROID) 112 MCG tablet Take 1 tablet (112 mcg total) by mouth daily before breakfast. 30 tablet 12   metoprolol succinate (TOPROL-XL) 50 MG 24 hr tablet Take 1 tablet (50 mg total) by mouth 2 (two) times daily. Take with or immediately following a meal. 60 tablet 0   Multiple Vitamins-Minerals (MULTI FOR HER 50+ PO) Take 1 tablet by mouth daily.     Multiple Vitamins-Minerals (PRESERVISION AREDS 2) CAPS Take 1 capsule by mouth 2 (two) times daily.     potassium chloride SA (KLOR-CON M) 20 MEQ tablet Take 1 tablet (20 mEq total) by mouth daily.     rosuvastatin (CRESTOR) 5 MG tablet Take 5 mg by mouth every other day.      umeclidinium-vilanterol (ANORO ELLIPTA) 62.5-25 MCG/INH AEPB Inhale 1 puff into the lungs daily.     torsemide (DEMADEX) 20 MG tablet Take 4 tablets (80 mg total) by mouth daily. 180 tablet 3   No current facility-administered medications for this encounter.   Wt Readings from Last 3 Encounters:  06/10/22 106.5 kg (234 lb 12.6 oz)  05/19/22 99.8 kg (220 lb)  05/02/22 100.4 kg (221 lb 6.4 oz)   General: NAD Neck: JVP 10-12 cm, no thyromegaly or thyroid nodule.  Lungs: Clear to auscultation bilaterally with normal respiratory effort. CV:  Nondisplaced PMI.  Heart regular S1/S2, no S3/S4, 2/6 HSM LLSB.  1+ ankle edema.  No carotid bruit.  Normal pedal pulses.  Abdomen: Soft, nontender, no hepatosplenomegaly, no distention.  Skin: Intact without lesions or rashes.  Neurologic: Alert and oriented x 3.  Psych: Normal affect. Extremities: No clubbing or cyanosis.  HEENT: Normal.   Assessment/Plan: 1.Chronic systolic CHF: Ischemic cardiomyopathy.  St Jude CRT-D device, now s/p AV nodal ablation.  Echo in 3/21 showed EF 20% with diffuse hypokinesis, mildly dilated RV with mildly decreased systolic function, PASP 49 mmHg, dilated IVC.  Echo 12/22 showed EF 20-25%, mildly decreased RV function.  TEE in 10/23 with EF 30%, PASP 60 mmHg, mild MR, moderate-severe TR, small TV vegetation.  NYHA class III symptoms.  She is volume overloaded on exam today.  - Increase torsemide to 80 mg daily.  BMET/BNP today and BMET in 10 days.  - Continue KCL 20 mEq daily. - She was unable to tolerate Bidil 1 tab tid, but she does fine with Bidil 1/2 tab tid, so will continue.  - Continue Toprol XL 50 mg bid.    - Off empagliflozin due to yeast infection and UTI.  - Off spironolactone with side effects.  2. CAD: S/p CABG in 2015. No chest pain. - No ASA given apixaban use.  - Continue Crestor 5 daily + Zetia, check lipids today. 3. Atrial fibrillation: Chronic now, looks like it has gone on since 2018.  Unable to cardiovert her on amiodarone so it was stopped.  At this point Afib chronic. She is now s/p AVN ablation to allow more BiV pacing. Rate controlled.   - Continue apixaban 2.5 mg bid.  4. CKD Stage 4: SCr  baseline 2.3-2.5.  - BMET today.  5. 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  Also with T8/9 discitis/osteomyelitis.  - She remains on suppressive amoxicillin per ID.  - I will arrange for repeat echo to assess tricuspid valve post-treatment of endocarditis.   Follow up 1 month with APP.   Signed, Loralie Champagne, MD   08/09/2022  [facility] [address] [facility] [place] [address] [phone] (office) [phone] (fax)

## 2022-08-10 ENCOUNTER — Ambulatory Visit (HOSPITAL_COMMUNITY)
Admission: RE | Admit: 2022-08-10 | Discharge: 2022-08-10 | Disposition: A | Discharge status: Home / Self Care | Payer: Medicare Other | Source: Ambulatory Visit | Attending: Internal Medicine | Admitting: Internal Medicine

## 2022-08-10 DIAGNOSIS — M4620 Osteomyelitis of vertebra, site unspecified: Secondary | ICD-10-CM

## 2022-08-10 DIAGNOSIS — I5022 Chronic systolic (congestive) heart failure: Secondary | ICD-10-CM | POA: Diagnosis not present

## 2022-08-14 ENCOUNTER — Ambulatory Visit (INDEPENDENT_AMBULATORY_CARE_PROVIDER_SITE_OTHER): Payer: Medicare Other

## 2022-08-14 DIAGNOSIS — I5022 Chronic systolic (congestive) heart failure: Secondary | ICD-10-CM

## 2022-08-14 DIAGNOSIS — Z95810 Presence of automatic (implantable) cardiac defibrillator: Secondary | ICD-10-CM | POA: Diagnosis not present

## 2022-08-14 NOTE — Progress Notes (Signed)
Cardiology Clinic Note   Patient Name: Elaine [name] Date of Encounter: 08/22/2022  Primary Care Provider:  Ginger Organ., MD Primary Cardiologist:  Glenetta Hew, MD  Patient Profile    Elaine [name] 81 year old female presents the clinic today for follow-up evaluation of her coronary artery disease and chronic systolic CHF  Past Medical History    Past Medical History:  Diagnosis Date   Acute myocardial infarction of anterior wall ([place]) 10/20/2013   Afib, LBBB   Acute pulmonary edema with congestive heart failure ([place]) 10/20/2013   Resolved   AICD (automatic cardioverter/defibrillator) present    Arthritis    Atrial fibrillation ([place]) 10/20/2013   On Warfarin   CAD, multiple vessel 10/20/2013   LM, LAD & RCA --> CABG; MYOVIEW 12/'15: Intermediate Risk would large anterior, anteroapical segment the apex possible infarct and peri-infarct ischemia   CHF (congestive heart failure) (HCC)    EF 40-45%.   Chronic kidney disease    COPD (chronic obstructive pulmonary disease) (HCC)    Diabetic neuropathy (HCC)    Heart murmur    Likely related to aortic sclerosis   Hyperlipidemia    Hypertension    Hypothyroidism    Ischemic cardiomyopathy - Notable Improvement in EF post CABG 10/20/2013   Echo 2/23: EF 20-25%; mild LVH. anteroseptal Akinesis; mid-apical anterior, inferior & inferoseptal + apical-lateral akinesis; G 1 DDysfxn.; Mild-Mod LA dil; mild MR; Mod PHTN;; F/u Echo 12/2013: EF 40-45%, septal & apical HK, mild LVH; Mod LA dilation   Left bundle branch block (LBBB) on electrocardiogram    Mild mitral regurgitation by prior echocardiogram    Mild-Mod MR on Echo   Morbid obesity ([place])    OSTEOARTHRITIS 08/06/2006   S/P CABG x 3 10/23/2013   LIMA to LAD, SVG to OM2, SVG to RPLB, EVH via right thigh   Type II diabetes mellitus with complication ([place]) 36/64/4034   off rx since heart attack 12/15-dr told could stay off if keep cbg under 150   UTI (lower urinary  tract infection) 09/29/2014   ongoing now. started rx 09/28/14   Past Surgical History:  Procedure Laterality Date   ABDOMINAL HYSTERECTOMY     AV NODE ABLATION N/A 05/19/2020   Procedure: AV NODE ABLATION;  Surgeon: Constance Haw, MD;  Location: [place] [facility];  Service: Cardiovascular;  Laterality: N/A;   BIV ICD INSERTION CRT-D N/A 07/28/2019   Procedure: BIV ICD INSERTION CRT-D;  Surgeon: Constance Haw, MD;  Location: [place] [facility];  Service: Cardiovascular;  Laterality: N/A;   BREAST BIOPSY Right 08/04/2016    fibroadenoma with calcifications    BREAST BIOPSY Right 07/16/2014   fibroadenoma with calcifications and atypical   BREAST EXCISIONAL BIOPSY Right 10/06/2014   atypical lobular hyperplasia    BREAST LUMPECTOMY W/ NEEDLE LOCALIZATION Right 10/06/2014   Dr Georgette Dover   BREAST LUMPECTOMY WITH NEEDLE LOCALIZATION Right 10/06/2014   Procedure: RIGHT BREAST LUMPECTOMY WITH NEEDLE LOCALIZATION;  Surgeon: Donnie Mesa, MD;  Location: [place];  Service: General;  Laterality: Right;   CARDIOVERSION N/A 10/22/2019   Procedure: CARDIOVERSION;  Surgeon: Larey Dresser, MD;  Location: [facility] ENDOSCOPY;  Service: Cardiovascular;  Laterality: N/A;   CARPAL TUNNEL RELEASE Right 04/13/2022   Procedure: RIGHT OPEN CARPAL TUNNEL RELEASE;  Surgeon: Mcarthur Rossetti, MD;  Location: [place];  Service: Orthopedics;  Laterality: Right;   CLIPPING OF ATRIAL APPENDAGE N/A 10/23/2013   Procedure: CLIPPING OF ATRIAL APPENDAGE;  Surgeon: Braulio Conte  Keturah Barre, MD;  Location: [place];  Service: Open Heart Surgery;  Laterality: N/A;   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   CORONARY ARTERY BYPASS GRAFT N/A 10/23/2013   Procedure: CORONARY ARTERY BYPASS GRAFTING (CABG);  Surgeon: Rexene Alberts, MD;  Location: [place];  Service: Open Heart Surgery: LIMA-LAD, SVG-OM2, SVG-RPL   INTRAOPERATIVE TRANSESOPHAGEAL ECHOCARDIOGRAM N/A 10/23/2013   Procedure: INTRAOPERATIVE TRANSESOPHAGEAL ECHOCARDIOGRAM;  Surgeon:  Rexene Alberts, MD;  Location: [place];  Service: Open Heart Surgery;  Laterality: N/A;   LEFT HEART CATHETERIZATION WITH CORONARY ANGIOGRAM N/A 10/20/2013   Procedure: LEFT HEART CATHETERIZATION WITH CORONARY ANGIOGRAM;  Surgeon: Leonie Man, MD;  Location: [facility] CATH LAB;  Service: Cardiovascular: Distal LM ~70%, LAD - ostial 70%, prox 80, 95 & 95%; RCA prox 70%, mid 80%; minimal Cx disease   LEFT HEART CATHETERIZATION WITH CORONARY/GRAFT ANGIOGRAM N/A 09/08/2014   Procedure: LEFT HEART CATHETERIZATION WITH Beatrix Fetters;  Surgeon: Leonie Man, MD;  Location: [facility] CATH LAB: Indication: Abnormal Myoview. Occluded LAD after 70 and 80% left main. Diffuse RCA disease. Widely patent grafts to Moderately diseased artery vessels. Mild-moderate LV dysfunction and chronic A. fib.   NM MYOVIEW LTD  08/14/2014   Lexi scan: INTERMEDIATE RISK - large, severe intensity perfusion defect in anterior wall, apex and inferior apex that is partly reversible. This suggests either scar or possible hibernating myocardium. Nondeviated.-> Likely scar. No change in coronary anatomy with patent grafts.   TEE WITHOUT CARDIOVERSION N/A 06/06/2022   Procedure: TRANSESOPHAGEAL ECHOCARDIOGRAM (TEE);  Surgeon: Larey Dresser, MD;  Location: [facility] ENDOSCOPY;  Service: Cardiovascular;  Laterality: N/A;   TOTAL HIP ARTHROPLASTY  2010, 2011   right 2010, left 2011   TRANSTHORACIC ECHOCARDIOGRAM  12/2013   Mildly dilated left ventricle with mild to moderately reduced function. EF 40-45% (notable improvement from February 2015). Septal and apical hypokinesis. Mild LA dilation.   TRANSTHORACIC ECHOCARDIOGRAM  11/11/2019   EF 20% with severely dysfunction. Global HK. Unable to determine diastolic pressures. Mild RV function-with mildly elevated PAP-RAP estimated 15 mmHg.. Moderate LA dilation, mild RA dilation.    Allergies  Allergies  Allergen Reactions   Crestor [Rosuvastatin Calcium] Other (See Comments)    Cramps-  still taking    Allopurinol Hives   Cephalosporins Rash   Tape Rash    Adhesive tape    History of Present Illness    Elaine [name] has a PMH of coronary artery disease status post CABG, COPD, CKD stage III, ischemic cardiomyopathy and chronic systolic CHF.  She was admitted 2/15 with atrial fibrillation, RVR and chest discomfort.  She underwent cardiac catheterization which showed severe three-vessel coronary disease and underwent CABG x 3.  Her echocardiogram 2/15 showed an EF of 20-25%.  She went back into atrial fibrillation in 2018 and remained in atrial fibrillation.  Cardioversion was not attempted.  Her echocardiogram 10/20 showed an EF less than 20% with normal RV function.  She had a Environmental health practitioner CRT-D-D device implanted 11/20.  She reported feeling much better with CRT.  Her CKD is managed by Dr. Hollie Salk.  Cardioversion was attempted by Dr. Aundra Dubin while she was on amiodarone however she did not convert.  Her amiodarone was discontinued.  Echocardiogram 3/21 showed an EF of 20% with diffuse hypokinesis, mildly dilated RV.  9/21 she underwent AV nodal ablation.  She was not felt to be a good candidate for Jardiance due to recurrent urinary tract infections.  She was seen by Dr.  McLain on 08/09/2022.  She reported that she had returned home after completing therapy at skilled nursing.  She was doing physical therapy.  She was mostly confined to her wheelchair but was able to walk about 10 feet on her own.  She fatigues easily and noted shortness of breath.  Her [employer] Jude device showed 97% BiV pacing, no VT, and stable thoracic impedance.  She presents to the clinic today for follow-up evaluation and states she is doing in-home physical therapy and has been watching her diet.  She presents with her son who reports that she occasionally has dietary indiscretion.  We reviewed her recent clinic visit with heart failure and she expressed understanding.  She reports that she has a goal of walking  more.  She would like to be able to walk out to the sidewalk in front of her house and back.  She has good family support.  Will plan follow-up in 6 months and continue her current medication regimen.  Today she denies chest pain, shortness of breath, lower extremity edema, fatigue, palpitations, melena, hematuria, hemoptysis, diaphoresis, weakness, presyncope, syncope, orthopnea, and PND.   Home Medications    Prior to Admission medications   Medication Sig Start Date End Date Taking? Authorizing Provider  acetaminophen (TYLENOL) 650 MG CR tablet Take 1,300 mg by mouth 2 (two) times daily.    [provider]  albuterol (VENTOLIN HFA) 108 (90 Base) MCG/ACT inhaler Inhale 2 puffs into the lungs every 6 (six) hours as needed for wheezing or shortness of breath.    [provider]  amoxicillin (AMOXIL) 500 MG capsule Take 2 capsules (1,000 mg total) by mouth 2 (two) times daily. 08/08/22 08/03/23  Vu, Rockey Situ, MD  apixaban (ELIQUIS) 2.5 MG TABS tablet Take 2.5 mg by mouth 2 (two) times daily.    [provider]  Coenzyme Q10 (COQ-10) 100 MG CAPS Take 100 mg by mouth in the morning and at bedtime.     [provider]  colchicine 0.6 MG tablet Take 0.6 mg by mouth daily as needed (Gout).     [provider]  Cranberry 250 MG TABS Take 500 mg by mouth 2 (two) times daily.    [provider]  Dulaglutide (TRULICITY [place]) Inject 1.5 mg into the skin once a week. Tuesday    [provider]  ELDERBERRY PO Take 1 tablet by mouth 2 (two) times daily.    [provider]  ezetimibe (ZETIA) 10 MG tablet Take 10 mg by mouth daily.    [provider]  ferrous sulfate 325 (65 FE) MG tablet Take 325 mg by mouth every other day.    [provider]  isosorbide-hydrALAZINE (BIDIL) 20-37.5 MG tablet Take 0.5 tablets by mouth 3 (three) times daily. 12/15/21   Larey Dresser, MD  levothyroxine (SYNTHROID) 112 MCG tablet Take 1 tablet  (112 mcg total) by mouth daily before breakfast. 02/05/14   Donnajean Lopes, MD  metoprolol succinate (TOPROL-XL) 50 MG 24 hr tablet Take 1 tablet (50 mg total) by mouth 2 (two) times daily. Take with or immediately following a meal. 06/09/22   Domenic Polite, MD  Multiple Vitamins-Minerals (MULTI FOR HER 50+ PO) Take 1 tablet by mouth daily.    [provider]  Multiple Vitamins-Minerals (PRESERVISION AREDS 2) CAPS Take 1 capsule by mouth 2 (two) times daily.    [provider]  potassium chloride SA (KLOR-CON M) 20 MEQ tablet Take 1 tablet (20 mEq total) by  mouth daily. 06/10/22   Domenic Polite, MD  rosuvastatin (CRESTOR) 5 MG tablet Take 5 mg by mouth every other day.     [provider]  torsemide (DEMADEX) 20 MG tablet Take 4 tablets (80 mg total) by mouth daily. 08/09/22   Larey Dresser, MD  umeclidinium-vilanterol [facility] ELLIPTA) 62.5-25 MCG/INH AEPB Inhale 1 puff into the lungs daily.    [provider]    Family History    Family History  Adopted: Yes  Problem Relation Age of Onset   Other Mother    Hypertension Mother    Colon cancer Neg Hx    Esophageal cancer Neg Hx    Rectal cancer Neg Hx    Stomach cancer Neg Hx    is adopted.   Social History    Social History   Socioeconomic History   Marital status: Widowed    Spouse name: Not on file   Number of children: Not on file   Years of education: Not on file   Highest education level: Not on file  Occupational History   Occupation: retired  Tobacco Use   Smoking status: Former    Packs/day: 0.50    Years: 39.00    Total pack years: 19.50    Types: Cigarettes    Quit date: 08/28/1993    Years since quitting: 29.0   Smokeless tobacco: Never  Vaping Use   Vaping Use: Never used  Substance and Sexual Activity   Alcohol use: No    Comment: quit 95   Drug use: No   Sexual activity: Not on file  Other Topics Concern   Not on file  Social History Narrative   Lives with  husband and son in [place].  She is currently retired.   Former smoker quit in 1995.   Social Determinants of Health   Financial Resource Strain: Medium Risk (11/25/2019)   Overall Financial Resource Strain (CARDIA)    Difficulty of Paying Living Expenses: Somewhat hard  Food Insecurity: No Food Insecurity (11/25/2019)   Hunger Vital Sign    Worried About Running Out of Food in the Last Year: Never true    Ran Out of Food in the Last Year: Never true  Transportation Needs: No Transportation Needs (11/25/2019)   PRAPARE - Hydrologist (Medical): No    Lack of Transportation (Non-Medical): No  Physical Activity: Not on file  Stress: Not on file  Social Connections: Not on file  Intimate Partner Violence: Not on file     Review of Systems    General:  No chills, fever, night sweats or weight changes.  Cardiovascular:  No chest pain, dyspnea on exertion, edema, orthopnea, palpitations, paroxysmal nocturnal dyspnea. Dermatological: No rash, lesions/masses Respiratory: No cough, dyspnea Urologic: No hematuria, dysuria Abdominal:   No nausea, vomiting, diarrhea, bright red blood per rectum, melena, or hematemesis Neurologic:  No visual changes, wkns, changes in mental status. All other systems reviewed and are otherwise negative except as noted above.  Physical Exam    VS:  BP 131/71 (BP Location: Left Arm, Patient Position: Sitting, Cuff Size: Large)   Pulse 73   Ht '5\' 2"'$  (1.575 m)   Wt 218 lb 9.6 oz (99.2 kg)   SpO2 96%   BMI 39.98 kg/m  , BMI Body mass index is 39.98 kg/m. GEN: Well nourished, well developed, in no acute distress. HEENT: normal. Neck: Supple, no JVD, carotid bruits, or masses. Cardiac: RRR, no murmurs, rubs, or gallops.  No clubbing, cyanosis, edema.  Radials/DP/PT 2+ and equal bilaterally.  Respiratory:  Respirations regular and unlabored, clear to auscultation bilaterally. GI: Soft, nontender, nondistended, BS + x 4. MS: no  deformity or atrophy. Skin: warm and dry, no rash. Neuro:  Strength and sensation are intact. Psych: Normal affect.  Accessory Clinical Findings    Recent Labs: 06/03/2022: B Natriuretic Peptide 1,191.1 06/08/2022: Magnesium 2.0 08/08/2022: ALT 18; Hemoglobin 10.4; Platelets 254 08/18/2022: BUN 85; Creatinine, Ser 2.67; Potassium 3.3; Sodium 137   Recent Lipid Panel    Component Value Date/Time   CHOL 136 08/24/2021 1042   TRIG 54 08/24/2021 1042   HDL 63 08/24/2021 1042   CHOLHDL 2.2 08/24/2021 1042   VLDL 11 08/24/2021 1042   LDLCALC 62 08/24/2021 1042         ECG personally reviewed by me today- none today.   TEE 06/06/2022  IMPRESSIONS     1. Left ventricular ejection fraction, by estimation, is 30%. The left  ventricle has moderate to severely decreased function. The left ventricle  demonstrates global hypokinesis. The left ventricular internal cavity size  was mildly dilated.   2. No vegetation noted on pacer wires in RA/RV. Right ventricular  systolic function is mildly reduced. The right ventricular size is mildly  enlarged. There is severely elevated pulmonary artery systolic pressure.  The estimated right ventricular systolic   pressure is 35.5 mmHg.   3. The LA appendage has been surgically ligated. Left atrial size was  moderately dilated. No left atrial/left atrial appendage thrombus was  detected.   4. Right atrial size was mildly dilated.   5. The posterior mitral leaflet was calcified and restricted, mild mitral  regurgitation and no stenosis. No vegetation on the MV.   6. Trileaflet aortic valve. Lambl's excrescence was present but no  vegetation. No regurgitation or stenosis.   7. There was a small, < 1 cm, vegetation on the tricuspid valve.. The  tricuspid valve is abnormal. Tricuspid valve regurgitation is moderate to  severe.   8. The inferior vena cava is dilated in size with <50% respiratory  variability, suggesting right atrial pressure of  15 mmHg.   9. No PFO or ASD by color doppler.  10. Normal caliber thoracic aorta with grade 3 plaque in descending  thoracic aorta.   FINDINGS   Left Ventricle: Left ventricular ejection fraction, by estimation, is  30%. The left ventricle has moderate to severely decreased function. The  left ventricle demonstrates global hypokinesis. The left ventricular  internal cavity size was mildly dilated.  There is no left ventricular hypertrophy.   Right Ventricle: No vegetation noted on pacer wires in RA/RV. The right  ventricular size is mildly enlarged. No increase in right ventricular wall  thickness. Right ventricular systolic function is mildly reduced. There is  severely elevated pulmonary  artery systolic pressure. The tricuspid regurgitant velocity is 3.37 m/s,  and with an assumed right atrial pressure of 15 mmHg, the estimated right  ventricular systolic pressure is 73.2 mmHg.   Left Atrium: The LA appendage has been surgically ligated. Left atrial  size was moderately dilated. No left atrial/left atrial appendage thrombus  was detected.   Right Atrium: Right atrial size was mildly dilated.   Pericardium: There is no evidence of pericardial effusion.   Mitral Valve: The posterior mitral leaflet was calcified and restricted,  mild mitral regurgitation and no stenosis. No vegetation on the MV. The  mitral valve is abnormal. Mild mitral valve regurgitation.  Tricuspid Valve: There was a small, < 1 cm, vegetation on the tricuspid  valve. The tricuspid valve is abnormal. Tricuspid valve regurgitation is  moderate to severe.   Aortic Valve: Trileaflet aortic valve. Lambl's excrescence was present but  no vegetation. No regurgitation or stenosis. The aortic valve is  tricuspid. Aortic valve regurgitation is not visualized.   Pulmonic Valve: The pulmonic valve was normal in structure. Pulmonic valve  regurgitation is not visualized.   Aorta: Normal caliber thoracic aorta with  grade 3 plaque in descending  thoracic aorta. The aortic root is normal in size and structure.   Venous: The inferior vena cava is dilated in size with less than 50%  respiratory variability, suggesting right atrial pressure of 15 mmHg.   IAS/Shunts: No PFO or ASD by color doppler.   Additional Comments: A device lead is visualized in the right ventricle.    Assessment & Plan   1.  Coronary artery disease-no chest pain today.  Status post CABG x 3 2/15 Continue rosuvastatin, metoprolol, isosorbide, hydralazine Heart healthy low-sodium diet-salty 6 given Increase physical activity as tolerated  Hyperlipidemia-LDL 67 on 05/04/22 Continue  rosuvastatin, ezetimibe Heart healthy low-sodium high-fiber diet Increase physical activity as tolerated  Atrial fibrillation-cardiac unaware.  Heart rate today 73.  Has been persistent since 2018. Continue metoprolol, apixaban, isosorbide, hydralazine Chronic systolic CHF-status post BiV ICD.  Echocardiogram 3/21 showed an EF of 20%, diffuse hypokinesis, mildly dilated RV with mildly decreased systolic function and dilated IVC.  TEE 10/23 showed small TV vegetation EF 30%, mild MR, moderate-severe TR Continue continue with, torsemide Heart healthy low-sodium diet-salty 6 given Increase physical activity as tolerated Follows with advanced heart failure team  Disposition: Follow-up with Dr. Ellyn Hack in 6 months.   Jossie Ng. Elira Colasanti NP-C     08/22/2022, 9:35 AM [place] [address] Office [phone] Fax [phone]    I spent 14 minutes examining this patient, reviewing medications, and using patient centered shared decision making involving her cardiac care.  Prior to her visit I spent greater than 20 minutes reviewing her past medical history,  medications, and prior cardiac tests.

## 2022-08-17 NOTE — Progress Notes (Signed)
EPIC Encounter for ICM Monitoring  Patient Name: Elaine [name] is a 81 y.o. female Date: 08/17/2022 Primary Care Physican: Ginger Organ., MD Primary Cardiologist: [place] Electrophysiologist: Cyndi Bender Pacing: 97% 01/10/2022 Weight: 219 lbs 03/15/2022 Weight: 217 lbs 04/17/2022 Weight: 224 lbs 04/26/2022 Weight: 228 lbs                                                            Spoke with patient and heart failure questions reviewed.  Transmission results reviewed.  Pt asymptomatic for fluid accumulation.  Reports feeling well at this time and voices no complaints.         CorVue thoracic impedance suggesting normal fluid levels.     Prescribed:   Torsemide 20 mg take 4 tablets (80 mg total) by mouth daily.   Potassium 20 mEq take 1 tablet by mouth daily   Labs: 08/18/2022 BMET Scheduled 08/08/2022 Creatinine 2.14, BUN 64, Potassium 4.0, Sodium 140, GFR 23 06/10/2022 Creatinine 1.73, BUN 43, Potassium 2.8, Sodium 140, GFR 29 06/09/2022 Creatinine 2.39, BUN 57, Potassium 4.0, Sodium 135, GFR 20  06/08/2022 Creatinine 2.19, BUN 62, Potassium 4.0, Sodium 134, GFR 22  06/07/2022 Creatinine 2.21, BUN 61, Potassium 3.8, Sodium 135, GFR 22  06/06/2022 Creatinine 2.30, BUN 65, Potassium 3.5, Sodium 129, GFR 21  06/05/2022 Creatinine 2.25, BUN 62, Potassium 4.1, Sodium 134, GFR 21  06/04/2022 Creatinine 1.91, BUN 50, Potassium 4.0, Sodium 136, GFR 26  A complete set of results can be found in Results Review.   Recommendations:  No changes and encouraged to call if experiencing any fluid symptoms.   Follow-up plan: ICM clinic phone appointment on 09/18/2022.   91 day device clinic remote transmission 09/06/2022.     EP/Cardiology Office Visits:  09/08/2022 with HF clinic.  08/22/2022 with Coletta Memos, NP.   Recall 07/02/2022 with Dr Curt Bears.   Copy of ICM check sent to Dr. Curt Bears.   3 month ICM trend: 08/14/2022.    12-14 Month ICM trend:     [name],  RN 08/17/2022 7:56 AM

## 2022-08-18 ENCOUNTER — Ambulatory Visit (HOSPITAL_COMMUNITY)
Admission: RE | Admit: 2022-08-18 | Discharge: 2022-08-18 | Disposition: A | Discharge status: Home / Self Care | Payer: Medicare Other | Source: Ambulatory Visit | Attending: Internal Medicine | Admitting: Internal Medicine

## 2022-08-18 ENCOUNTER — Telehealth (HOSPITAL_COMMUNITY): Payer: Self-pay | Admitting: Cardiology

## 2022-08-18 DIAGNOSIS — I5022 Chronic systolic (congestive) heart failure: Secondary | ICD-10-CM | POA: Diagnosis present

## 2022-08-18 LAB — BASIC METABOLIC PANEL
Anion gap: 13 (ref 5–15)
BUN: 85 mg/dL — ABNORMAL HIGH (ref 8–23)
CO2: 29 mmol/L (ref 22–32)
Calcium: 9.4 mg/dL (ref 8.9–10.3)
Chloride: 95 mmol/L — ABNORMAL LOW (ref 98–111)
Creatinine, Ser: 2.67 mg/dL — ABNORMAL HIGH (ref 0.44–1.00)
GFR, Estimated: 17 mL/min — ABNORMAL LOW (ref >60)
Glucose, Bld: 186 mg/dL — ABNORMAL HIGH (ref 70–99)
Potassium: 3.3 mmol/L — ABNORMAL LOW (ref 3.5–5.1)
Sodium: 137 mmol/L (ref 135–145)

## 2022-08-18 MED ORDER — TORSEMIDE 20 MG PO TABS: 60.0000 mg | ORAL_TABLET | Freq: Every day | ORAL | 3 refills | Status: DC

## 2022-08-18 NOTE — Telephone Encounter (Signed)
-----   Message from Larey Dresser, MD sent at 08/18/2022  1:22 PM EST ----- Elevated creatinine.  Hold torsemide for a day then decrease to 60 mg daily.

## 2022-08-22 ENCOUNTER — Ambulatory Visit: Payer: Medicare Other | Attending: Cardiovascular Disease | Admitting: General Practice

## 2022-08-22 ENCOUNTER — Encounter: Payer: Self-pay | Admitting: General Practice

## 2022-08-22 VITALS — BP 131/71 | HR 73 | Ht 62.0 in | Wt 218.6 lb

## 2022-08-22 DIAGNOSIS — E785 Hyperlipidemia, unspecified: Secondary | ICD-10-CM | POA: Diagnosis not present

## 2022-08-22 DIAGNOSIS — I2510 Atherosclerotic heart disease of native coronary artery without angina pectoris: Secondary | ICD-10-CM | POA: Diagnosis not present

## 2022-08-22 DIAGNOSIS — I4821 Permanent atrial fibrillation: Secondary | ICD-10-CM | POA: Diagnosis not present

## 2022-08-22 NOTE — Patient Instructions (Signed)
Medication Instructions:  The current medical regimen is effective;  continue present plan and medications as directed. Please refer to the Current Medication list given to you today.  *If you need a refill on your cardiac medications before your next appointment, please call your pharmacy*  Lab Work: NONE If you have labs (blood work) drawn today and your tests are completely normal, you will receive your results only by:  [place] (if you have MyChart) OR  A paper copy in the mail If you have any lab test that is abnormal or we need to change your treatment, we will call you to review the results.  Testing/Procedures: NONE  Follow-Up: At [facility], you and your health needs are our priority.  As part of our continuing mission to provide you with exceptional heart care, we have created designated Provider Care Teams.  These Care Teams include your primary Cardiologist (physician) and Advanced Practice Providers (APPs -  Physician Assistants and Nurse Practitioners) who all work together to provide you with the care you need, when you need it.  Your next appointment:   6 month(s)  The format for your next appointment:   In Person  Provider:   Glenetta Hew, MD     Other Instructions  Important Information About Sugar

## 2022-09-06 NOTE — Progress Notes (Signed)
ID:  Elaine [name], DOB [birth year], MRN [id number]  Provider location: [facility] Type of Visit:  Heart Failure Follow up  PCP:  Marton Redwood, MD  Primary Cardiologist:  Glenetta Hew, MD EP: Dr Tomasa Blase HF Cardiologist: Dr. Aundra Dubin   HPI: Elaine [name] is a 82 y.o. with history of CAD s/p CABG, COPD, CKD stage 3, and chronic systolic systolic CHF/ischemic cardiomyopathy.  She was referred by Dr. Ellyn Hack for evaluation of CHF.  Patient was admitted in 2/15 with atrial fibrillation/RVR and chest pain.  Cath showed severe 3VD and she had CABG x 3.  Echo in 2/15 showed EF down to 20-25%. In 2018, it appears that she went back into atrial fibrillation and has remained in atrial fibrillation persistently.  DCCV was not attempted.   Most recent echo in 10/20 showed EF < 20% with normal RV.  She had a St Jude CRT-D device implanted in 11/20.  She says that she feels "90%" better with CRT.  She is followed by Dr. Hollie Salk with nephrology.   She has been in atrial fibrillation persistently, I attempted DCCV while on amiodarone but she did not convert.  Therefore, amiodarone was stopped.     Echo in 3/21 showed EF 20% with diffuse hypokinesis, mildly dilated RV with mildly decreased systolic function, PASP 49 mmHg, dilated IVC.  In 9/21, she had AV nodal ablation.    She stopped empagliflozin due to UTIs and yeast infections.   Echo 12/22 EF 20-25%, mildly decreased RV function.   She was admitted in 10/23 with E faecalis UTI.  She was found to have T8/9 discitis/osteomyelitis.  TEE showed small TV vegetation that did not appear to involve her device.  Other findings were EF 30%, PASP 60 mmHg, mild MR, moderate-severe TR.  EP thought she was too high risk for device extraction.  She was treated with 6 wks of IV ampicillin and ceftriaxone.  She is now on amoxicillin.   Follow up 12/23, NYHA III and volume overloaded. Torsemide increased to 80 mg daily. Echo arranged to evaluate TR.  Had BMET the next week and creatinine was elevated so torsemide was cut back to 60 mg daily.  Today she returns for HF follow up.Overall feeling fine. Remains SOB with exertion but this is her baseline. Denies PND/Orthopnea. She sleeps with head of the bed slightly elevated. Uses walker at home. Appetite ok. No fever or chills. Weight at home 226  pounds. Completed PT but still followed by OT. Taking all medications. Inable to send device interrogation because phone is not working at home.   Labs (2/21): K 4.5, creatinine 2.08 => 2.99, hgb 12.8 Labs (7/21): K 4, creatinine 2.3, TSH normal Labs (12/21): K 4, creatinine 2.31, BNP 671 Labs (1/22): K 4.1, creatinine 2.48  Labs (3/22): LDL 77 Labs (6/22): K 4.9, creatinine 2.26 Labs (10/22): K 4.8, creatinine 2.41 Labs (12/22): LDL 62, hgb 13.5 Labs (2/23): K 4.4, creatinine 2.28 Labs (11/11/21): K 5.8  Labs (3/23): BUN 52 creatinine 2.5 K 4.7  Labs (5/23): K 4.2, creatinine 2.17  Labs (8/23): K 3.7, creatinine 2.11, hgb 12.5 Labs (12/23): K 4, creatinine 2.14, hgb 10.4 Labs (08/18/22): K 3.3 Creatinine 2.6   PMH: 1. CAD: s/p CABG in 2/15 with LIMA-LAD, SVG-OM2, SVG-PLV.  2. LBBB: Chronic.  3. Atrial fibrillation: Persistent since 2018.  - LA appendage clip with CABG.  4. H/o aspiration PNA 5. Type 2 diabetes 6. COPD 7. CKD stage 4 8. Chronic  systolic CHF: Ischemic cardiomyopathy.  She has a St Jude BiV ICD.  - Echo (2/15): EF 20-25%.  - Echo (5/15): EF 40-45% - Echo (10/20): EF <20%, mild LVH, normal RV size and systolic function.  - Echo (3/21): EF 20% with diffuse hypokinesis, mildly dilated RV with mildly decreased systolic function, PASP 49 mmHg, dilated IVC.  - AV nodal ablation - Echo (12/22): EF 20-25%, mildly decreased RV function. - TEE (10/23): Small TV vegetation that did not appear to involve her device.  Other findings were EF 30%, PASP 60 mmHg, mild MR, moderate-severe TR 9. 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 She also had T8/T9 discitis/osteomyelitis.    Social History   Socioeconomic History   Marital status: Widowed    Spouse name: Not on file   Number of children: Not on file   Years of education: Not on file   Highest education level: Not on file  Occupational History   Occupation: retired  Tobacco Use   Smoking status: Former    Packs/day: 0.50    Years: 39.00    Total pack years: 19.50    Types: Cigarettes    Quit date: 08/28/1993    Years since quitting: 29.0   Smokeless tobacco: Never  Vaping Use   Vaping Use: Never used  Substance and Sexual Activity   Alcohol use: No    Comment: quit 95   Drug use: No   Sexual activity: Not on file  Other Topics Concern   Not on file  Social History Narrative   Lives with husband and son in [place].  She is currently retired.   Former smoker quit in 1995.   Social Determinants of Health   Financial Resource Strain: Medium Risk (11/25/2019)   Overall Financial Resource Strain (CARDIA)    Difficulty of Paying Living Expenses: Somewhat hard  Food Insecurity: No Food Insecurity (11/25/2019)   Hunger Vital Sign    Worried About Running Out of Food in the Last Year: Never true    Ran Out of Food in the Last Year: Never true  Transportation Needs: No Transportation Needs (11/25/2019)   PRAPARE - Hydrologist (Medical): No    Lack of Transportation (Non-Medical): No  Physical Activity: Not on file  Stress: Not on file  Social Connections: Not on file  Intimate Partner Violence: Not on file   Family History  Adopted: Yes  Problem Relation Age of Onset   Other Mother    Hypertension Mother    Colon cancer Neg Hx    Esophageal cancer Neg Hx    Rectal cancer Neg Hx    Stomach cancer Neg Hx    Current Outpatient Medications  Medication Sig Dispense Refill   acetaminophen (TYLENOL) 650 MG CR tablet Take 1,300 mg by mouth 2 (two) times daily.     albuterol (VENTOLIN HFA) 108 (90 Base)  MCG/ACT inhaler Inhale 2 puffs into the lungs every 6 (six) hours as needed for wheezing or shortness of breath.     amoxicillin (AMOXIL) 500 MG capsule Take 2 capsules (1,000 mg total) by mouth 2 (two) times daily. 120 capsule 11   apixaban (ELIQUIS) 2.5 MG TABS tablet Take 2.5 mg by mouth 2 (two) times daily.     Coenzyme Q10 (COQ-10) 100 MG CAPS Take 100 mg by mouth in the morning and at bedtime.      colchicine 0.6 MG tablet Take 0.6 mg by mouth daily as needed (Gout).  Cranberry 250 MG TABS Take 500 mg by mouth 2 (two) times daily.     Dulaglutide (TRULICITY [place]) Inject 1.5 mg into the skin once a week. Tuesday     ELDERBERRY PO Take 1 tablet by mouth 2 (two) times daily.     ezetimibe (ZETIA) 10 MG tablet Take 10 mg by mouth daily.     ferrous sulfate 325 (65 FE) MG tablet Take 325 mg by mouth every other day.     isosorbide-hydrALAZINE (BIDIL) 20-37.5 MG tablet Take 0.5 tablets by mouth 3 (three) times daily. 135 tablet 3   levothyroxine (SYNTHROID) 112 MCG tablet Take 1 tablet (112 mcg total) by mouth daily before breakfast. 30 tablet 12   metoprolol succinate (TOPROL-XL) 50 MG 24 hr tablet Take 1 tablet (50 mg total) by mouth 2 (two) times daily. Take with or immediately following a meal. 60 tablet 0   Multiple Vitamins-Minerals (MULTI FOR HER 50+ PO) Take 1 tablet by mouth daily.     Multiple Vitamins-Minerals (PRESERVISION AREDS 2) CAPS Take 1 capsule by mouth 2 (two) times daily.     potassium chloride SA (KLOR-CON M) 20 MEQ tablet Take 1 tablet (20 mEq total) by mouth daily.     rosuvastatin (CRESTOR) 5 MG tablet Take 5 mg by mouth every other day.      torsemide (DEMADEX) 20 MG tablet Take 3 tablets (60 mg total) by mouth daily. 90 tablet 3   umeclidinium-vilanterol (ANORO ELLIPTA) 62.5-25 MCG/INH AEPB Inhale 1 puff into the lungs daily.     No current facility-administered medications for this visit.   Wt Readings from Last 3 Encounters:  08/22/22 99.2 kg (218 lb 9.6 oz)   06/10/22 106.5 kg (234 lb 12.6 oz)  05/19/22 99.8 kg (220 lb)   General:  Arrived in wheelchair. No resp difficulty HEENT: normal Neck: supple. no JVD. Carotids 2+ bilat; no bruits. No lymphadenopathy or thryomegaly appreciated. Cor: PMI nondisplaced. Regular rate & rhythm. No rubs, gallops . 2/6 LLSB murmurs. Lungs: clear Abdomen: soft, nontender, nondistended. No hepatosplenomegaly. No bruits or masses. Good bowel sounds. Extremities: no cyanosis, clubbing, rash, edema Neuro: alert & orientedx3, cranial nerves grossly intact. moves all 4 extremities w/o difficulty. Affect pleasant  Assessment/Plan: 1.Chronic systolic CHF: Ischemic cardiomyopathy.  St Jude CRT-D device, now s/p AV nodal ablation.  Echo in 3/21 showed EF 20% with diffuse hypokinesis, mildly dilated RV with mildly decreased systolic function, PASP 49 mmHg, dilated IVC.  Echo 12/22 showed EF 20-25%, mildly decreased RV function.  TEE in 10/23 with EF 30%, PASP 60 mmHg, mild MR, moderate-severe TR, small TV vegetation.   NYHA III. Volume status stable. Continue  torsemide to 60 mg daily.  Check BMET  - Continue KCL 20 mEq daily. - She was unable to tolerate Bidil 1 tab tid, but she does fine with Bidil 1/2 tab tid, so will continue.  - Continue Toprol XL 50 mg bid.    - Off empagliflozin due to yeast infection and UTI.  - Off spironolactone with side effects.  2. CAD: S/p CABG in 2015.  - No chest pain.  - No ASA given apixaban use.  - Continue Crestor 5 daily + Zetia, check lipids today. 3. Atrial fibrillation: Chronic now, looks like it has gone on since 2018.  Unable to cardiovert her on amiodarone so it was stopped.  At this point Afib chronic. She is now s/p AVN ablation to allow more BiV pacing. Rate controlled.    - Continue apixaban  2.5 mg bid.  4. CKD Stage 4: SCr baseline 2.3-2.5.  - BMET today.  5. 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  Also with T8/9 discitis/osteomyelitis.  - She remains on  suppressive amoxicillin per ID.  - Repeat echo next visit to assess tricuspid valve post-treatment of endocarditis.   Follow up in 2 months with Dr Aundra Dubin and an ECHO.  Darrick Grinder NP-C  9:47 AM   [facility] [address] [facility] and [place] [address] [phone] (office) [phone] (fax)

## 2022-09-08 ENCOUNTER — Ambulatory Visit (HOSPITAL_COMMUNITY)
Admission: RE | Admit: 2022-09-08 | Discharge: 2022-09-08 | Disposition: A | Discharge status: Home / Self Care | Payer: Medicare Other | Source: Ambulatory Visit | Attending: Family Medicine | Admitting: Family Medicine

## 2022-09-08 ENCOUNTER — Encounter (HOSPITAL_COMMUNITY): Payer: Self-pay | Admitting: Cardiology

## 2022-09-08 VITALS — BP 120/80 | HR 90 | Wt 226.0 lb

## 2022-09-08 DIAGNOSIS — I4821 Permanent atrial fibrillation: Secondary | ICD-10-CM

## 2022-09-08 DIAGNOSIS — I255 Ischemic cardiomyopathy: Secondary | ICD-10-CM | POA: Diagnosis not present

## 2022-09-08 DIAGNOSIS — N184 Chronic kidney disease, stage 4 (severe): Secondary | ICD-10-CM | POA: Diagnosis not present

## 2022-09-08 DIAGNOSIS — Z951 Presence of aortocoronary bypass graft: Secondary | ICD-10-CM | POA: Insufficient documentation

## 2022-09-08 DIAGNOSIS — E1122 Type 2 diabetes mellitus with diabetic chronic kidney disease: Secondary | ICD-10-CM | POA: Diagnosis not present

## 2022-09-08 DIAGNOSIS — Z79899 Other long term (current) drug therapy: Secondary | ICD-10-CM | POA: Insufficient documentation

## 2022-09-08 DIAGNOSIS — J449 Chronic obstructive pulmonary disease, unspecified: Secondary | ICD-10-CM | POA: Diagnosis present

## 2022-09-08 DIAGNOSIS — Z7901 Long term (current) use of anticoagulants: Secondary | ICD-10-CM | POA: Insufficient documentation

## 2022-09-08 DIAGNOSIS — I079 Rheumatic tricuspid valve disease, unspecified: Secondary | ICD-10-CM | POA: Insufficient documentation

## 2022-09-08 DIAGNOSIS — I2510 Atherosclerotic heart disease of native coronary artery without angina pectoris: Secondary | ICD-10-CM

## 2022-09-08 DIAGNOSIS — I4819 Other persistent atrial fibrillation: Secondary | ICD-10-CM | POA: Diagnosis not present

## 2022-09-08 DIAGNOSIS — I5022 Chronic systolic (congestive) heart failure: Secondary | ICD-10-CM | POA: Diagnosis not present

## 2022-09-08 DIAGNOSIS — N1832 Chronic kidney disease, stage 3b: Secondary | ICD-10-CM

## 2022-09-08 DIAGNOSIS — B952 Enterococcus as the cause of diseases classified elsewhere: Secondary | ICD-10-CM | POA: Diagnosis not present

## 2022-09-08 DIAGNOSIS — N390 Urinary tract infection, site not specified: Secondary | ICD-10-CM | POA: Insufficient documentation

## 2022-09-08 LAB — BASIC METABOLIC PANEL
Anion gap: 11 (ref 5–15)
BUN: 58 mg/dL — ABNORMAL HIGH (ref 8–23)
CO2: 30 mmol/L (ref 22–32)
Calcium: 9.6 mg/dL (ref 8.9–10.3)
Chloride: 94 mmol/L — ABNORMAL LOW (ref 98–111)
Creatinine, Ser: 2.35 mg/dL — ABNORMAL HIGH (ref 0.44–1.00)
GFR, Estimated: 20 mL/min — ABNORMAL LOW (ref >60)
Glucose, Bld: 134 mg/dL — ABNORMAL HIGH (ref 70–99)
Potassium: 3.2 mmol/L — ABNORMAL LOW (ref 3.5–5.1)
Sodium: 135 mmol/L (ref 135–145)

## 2022-09-08 NOTE — Patient Instructions (Signed)
It was great to see you today! No medication changes are needed at this time.   Labs today We will only contact you if something comes back abnormal or we need to make some changes. Otherwise no news is good news!   Your physician recommends that you schedule a follow-up appointment in: 2 months with Dr Aundra Dubin and echo   Your physician has requested that you have an echocardiogram. Echocardiography is a painless test that uses sound waves to create images of your heart. It provides your doctor with information about the size and shape of your heart and how well your heart's chambers and valves are working. This procedure takes approximately one hour. There are no restrictions for this procedure. Please do NOT wear cologne, perfume, aftershave, or lotions (deodorant is allowed). Please arrive 15 minutes prior to your appointment time.   Do the following things EVERYDAY: Weigh yourself in the morning before breakfast. Write it down and keep it in a log. Take your medicines as prescribed Eat low salt foods--Limit salt (sodium) to 2000 mg per day.  Stay as active as you can everyday Limit all fluids for the day to less than 2 liters  At the [facility], you and your health needs are our priority. As part of our continuing mission to provide you with exceptional heart care, we have created designated Provider Care Teams. These Care Teams include your primary Cardiologist (physician) and Advanced Practice Providers (APPs- Physician Assistants and Nurse Practitioners) who all work together to provide you with the care you need, when you need it.   You may see any of the following providers on your designated Care Team at your next follow up: Dr Glori Bickers Dr Loralie Champagne Dr. Roxana Hires, NP Lyda Jester, [place] [facility] [place], [place] Forestine Na, NP Audry Riles, PharmD   Please be sure to bring in all your medications bottles to  every appointment.   If you have any questions or concerns before your next appointment please send [place] a message through [place] or call our office at [phone].    TO LEAVE A MESSAGE FOR THE NURSE SELECT OPTION 2, PLEASE LEAVE A MESSAGE INCLUDING: YOUR NAME DATE OF BIRTH CALL BACK NUMBER REASON FOR CALL**this is important as we prioritize the call backs  YOU WILL RECEIVE A CALL BACK THE SAME DAY AS LONG AS YOU CALL BEFORE 4:00 PM.

## 2022-09-12 ENCOUNTER — Telehealth (HOSPITAL_COMMUNITY): Payer: Self-pay | Admitting: *Deleted

## 2022-09-12 DIAGNOSIS — E876 Hypokalemia: Secondary | ICD-10-CM

## 2022-09-12 DIAGNOSIS — I5022 Chronic systolic (congestive) heart failure: Secondary | ICD-10-CM

## 2022-09-12 MED ORDER — POTASSIUM CHLORIDE CRYS ER 20 MEQ PO TBCR: 40.0000 meq | EXTENDED_RELEASE_TABLET | Freq: Every day | ORAL | 3 refills | Status: DC

## 2022-09-12 NOTE — Telephone Encounter (Signed)
Called patient per Darrick Grinder, NP with following lab results and instructions:  "Renal function stable. No change. Potassium low. Increase potassium to 40 meq daily".  Pt verbalized understanding of same. She says she has adequate supply of potassium at home.

## 2022-09-13 ENCOUNTER — Other Ambulatory Visit: Payer: Self-pay | Admitting: Internal Medicine

## 2022-09-13 DIAGNOSIS — Z1231 Encounter for screening mammogram for malignant neoplasm of breast: Secondary | ICD-10-CM

## 2022-09-18 ENCOUNTER — Ambulatory Visit: Payer: 59 | Attending: Cardiology

## 2022-09-25 NOTE — Progress Notes (Signed)
No ICM remote transmission received for 09/18/2022 and next ICM transmission scheduled for 10/02/2022.

## 2022-09-29 ENCOUNTER — Telehealth: Payer: Self-pay

## 2022-09-29 NOTE — Telephone Encounter (Signed)
Spoke with patient. Advised Elaine [name] monitor is disconnected for the past month.  She reports Elaine [name] reports are sent by Elaine [name] phone app but t mobile too Elaine [name] app off the phone and she does not understand how the apps work.  She will need assistance to get the app and set it up.  She needs to know a week in advance when the appointment will be so she can get a ride.  Advised she can go any day to the [address] office and the device clinic personnel can assist Elaine [name] any time.   Provided device clinic number [phone] but she does not need an appointment.  She stated she may go Monday if she can get a ride.

## 2022-10-02 ENCOUNTER — Ambulatory Visit: Payer: 59 | Attending: Cardiology

## 2022-10-02 DIAGNOSIS — I5022 Chronic systolic (congestive) heart failure: Secondary | ICD-10-CM

## 2022-10-02 DIAGNOSIS — Z95810 Presence of automatic (implantable) cardiac defibrillator: Secondary | ICD-10-CM | POA: Diagnosis not present

## 2022-10-03 ENCOUNTER — Ambulatory Visit: Payer: 59

## 2022-10-03 DIAGNOSIS — I255 Ischemic cardiomyopathy: Secondary | ICD-10-CM

## 2022-10-03 LAB — CUP PACEART REMOTE DEVICE CHECK
Date Time Interrogation Session: 20240206130412
Implantable Lead Connection Status: 753985
Implantable Lead Connection Status: 753985
Implantable Lead Implant Date: 20201130
Implantable Lead Implant Date: 20201130
Implantable Lead Location: 753858
Implantable Lead Location: 753860
Implantable Pulse Generator Implant Date: 20201130
Pulse Gen Serial Number: 810000193

## 2022-10-04 NOTE — Progress Notes (Signed)
EPIC Encounter for ICM Monitoring  Patient Name: Elaine [name] is a 82 y.o. female Date: 10/04/2022 Primary [name] Physican: Ginger Organ., MD Primary Cardiologist: [place] Electrophysiologist: Cyndi Bender Pacing: 97% 01/10/2022 Weight: 219 lbs 03/15/2022 Weight: 217 lbs 04/17/2022 Weight: 224 lbs 04/26/2022 Weight: 228 lbs                                                            Spoke with patient and heart failure questions reviewed.  Pt reports she has a really bad cold and not feeling well.        CorVue thoracic impedance suggesting normal fluid levels.     Prescribed:   Torsemide 20 mg take 4 tablets (80 mg total) by mouth daily.   Potassium 20 mEq take 1 tablet by mouth daily   Labs: 09/08/2022 Creatinine 2.35, BUN 58, Potassium 3.2, Sodium 135 08/18/2022 Creatinine 2.67, BUN 85, Potassium 3.3, Sodium 137 08/08/2022 Creatinine 2.14, BUN 64, Potassium 4.0, Sodium 140, GFR 23 06/10/2022 Creatinine 1.73, BUN 43, Potassium 2.8, Sodium 140, GFR 29 06/09/2022 Creatinine 2.39, BUN 57, Potassium 4.0, Sodium 135, GFR 20  06/08/2022 Creatinine 2.19, BUN 62, Potassium 4.0, Sodium 134, GFR 22  06/07/2022 Creatinine 2.21, BUN 61, Potassium 3.8, Sodium 135, GFR 22  06/06/2022 Creatinine 2.30, BUN 65, Potassium 3.5, Sodium 129, GFR 21  06/05/2022 Creatinine 2.25, BUN 62, Potassium 4.1, Sodium 134, GFR 21  06/04/2022 Creatinine 1.91, BUN 50, Potassium 4.0, Sodium 136, GFR 26  A complete set of results can be found in Results Review.   Recommendations:  No changes and encouraged to call if experiencing any fluid symptoms.   Follow-up plan: ICM clinic phone appointment on 11/06/2022.   91 day device clinic remote transmission 12/06/2022.     EP/Cardiology Office Visits:  11/01/2022 with Dr Aundra Dubin.  08/22/2022 with Coletta Memos, NP.   Recall 07/02/2022 with Dr Curt Bears.   Copy of ICM check sent to Dr. Curt Bears.   3 month ICM trend: 10/03/2022.    12-14 Month ICM trend:      [name], RN 10/04/2022 2:13 PM

## 2022-11-01 ENCOUNTER — Encounter (HOSPITAL_COMMUNITY): Payer: Self-pay | Admitting: Cardiology

## 2022-11-01 ENCOUNTER — Ambulatory Visit (HOSPITAL_BASED_OUTPATIENT_CLINIC_OR_DEPARTMENT_OTHER)
Admission: RE | Admit: 2022-11-01 | Discharge: 2022-11-01 | Disposition: A | Discharge status: Home / Self Care | Payer: 59 | Source: Ambulatory Visit | Attending: Cardiology | Admitting: Cardiology

## 2022-11-01 ENCOUNTER — Ambulatory Visit (HOSPITAL_COMMUNITY)
Admission: RE | Admit: 2022-11-01 | Discharge: 2022-11-01 | Disposition: A | Discharge status: Home / Self Care | Payer: 59 | Source: Ambulatory Visit | Attending: Internal Medicine | Admitting: Internal Medicine

## 2022-11-01 DIAGNOSIS — Z7901 Long term (current) use of anticoagulants: Secondary | ICD-10-CM | POA: Insufficient documentation

## 2022-11-01 DIAGNOSIS — E1122 Type 2 diabetes mellitus with diabetic chronic kidney disease: Secondary | ICD-10-CM | POA: Diagnosis not present

## 2022-11-01 DIAGNOSIS — E876 Hypokalemia: Secondary | ICD-10-CM | POA: Diagnosis not present

## 2022-11-01 DIAGNOSIS — R0602 Shortness of breath: Secondary | ICD-10-CM | POA: Insufficient documentation

## 2022-11-01 DIAGNOSIS — I130 Hypertensive heart and chronic kidney disease with heart failure and stage 1 through stage 4 chronic kidney disease, or unspecified chronic kidney disease: Secondary | ICD-10-CM | POA: Diagnosis not present

## 2022-11-01 DIAGNOSIS — I5022 Chronic systolic (congestive) heart failure: Secondary | ICD-10-CM

## 2022-11-01 DIAGNOSIS — I4819 Other persistent atrial fibrillation: Secondary | ICD-10-CM | POA: Insufficient documentation

## 2022-11-01 DIAGNOSIS — I2510 Atherosclerotic heart disease of native coronary artery without angina pectoris: Secondary | ICD-10-CM | POA: Insufficient documentation

## 2022-11-01 DIAGNOSIS — Z79899 Other long term (current) drug therapy: Secondary | ICD-10-CM | POA: Insufficient documentation

## 2022-11-01 DIAGNOSIS — I081 Rheumatic disorders of both mitral and tricuspid valves: Secondary | ICD-10-CM | POA: Diagnosis not present

## 2022-11-01 DIAGNOSIS — Z951 Presence of aortocoronary bypass graft: Secondary | ICD-10-CM | POA: Diagnosis not present

## 2022-11-01 DIAGNOSIS — J449 Chronic obstructive pulmonary disease, unspecified: Secondary | ICD-10-CM | POA: Diagnosis not present

## 2022-11-01 DIAGNOSIS — N184 Chronic kidney disease, stage 4 (severe): Secondary | ICD-10-CM | POA: Insufficient documentation

## 2022-11-01 DIAGNOSIS — Z7985 Long-term (current) use of injectable non-insulin antidiabetic drugs: Secondary | ICD-10-CM | POA: Insufficient documentation

## 2022-11-01 DIAGNOSIS — I255 Ischemic cardiomyopathy: Secondary | ICD-10-CM | POA: Insufficient documentation

## 2022-11-01 LAB — ECHOCARDIOGRAM COMPLETE
Area-P 1/2: 4.8 cm2
Calc EF: 31.4 %
MV M vel: 5.05 m/s
MV Peak grad: 102 mmHg
S' Lateral: 4.1 cm
Single Plane A2C EF: 29.4 %
Single Plane A4C EF: 32.5 %

## 2022-11-01 LAB — BRAIN NATRIURETIC PEPTIDE: B Natriuretic Peptide: 308.7 pg/mL — ABNORMAL HIGH (ref 0.0–100.0)

## 2022-11-01 LAB — BASIC METABOLIC PANEL
Anion gap: 13 (ref 5–15)
BUN: 39 mg/dL — ABNORMAL HIGH (ref 8–23)
CO2: 30 mmol/L (ref 22–32)
Calcium: 9.9 mg/dL (ref 8.9–10.3)
Chloride: 94 mmol/L — ABNORMAL LOW (ref 98–111)
Creatinine, Ser: 1.9 mg/dL — ABNORMAL HIGH (ref 0.44–1.00)
GFR, Estimated: 26 mL/min — ABNORMAL LOW (ref >60)
Glucose, Bld: 107 mg/dL — ABNORMAL HIGH (ref 70–99)
Potassium: 2.9 mmol/L — ABNORMAL LOW (ref 3.5–5.1)
Sodium: 137 mmol/L (ref 135–145)

## 2022-11-01 MED ORDER — POTASSIUM CHLORIDE CRYS ER 20 MEQ PO TBCR: 40.0000 meq | EXTENDED_RELEASE_TABLET | Freq: Every day | ORAL | 3 refills | Status: DC

## 2022-11-01 MED ORDER — SPIRONOLACTONE 25 MG PO TABS: 12.5000 mg | ORAL_TABLET | Freq: Every day | ORAL | 3 refills | Status: DC

## 2022-11-01 MED ORDER — TORSEMIDE 20 MG PO TABS: ORAL_TABLET | ORAL | 3 refills | Status: DC

## 2022-11-01 NOTE — Patient Instructions (Signed)
INCREASE Torsemide to 80 mg in the morning and 40 mg in the evening  START Spironolactone 12.5 mg (1/2 Tab) daily.  RESTART your Potassium at 40 mEq daily ( 2 Tabs)  Labs done today, your results will be available in MyChart, we will contact you for abnormal readings.  Repeat blood work in 10 days.  Your physician recommends that you schedule a follow-up appointment in: 3 weeks  If you have any questions or concerns before your next appointment please send [place] a message through [place] or call our office at [phone].    TO LEAVE A MESSAGE FOR THE NURSE SELECT OPTION 2, PLEASE LEAVE A MESSAGE INCLUDING: YOUR NAME DATE OF BIRTH CALL BACK NUMBER REASON FOR CALL**this is important as we prioritize the call backs  YOU WILL RECEIVE A CALL BACK THE SAME DAY AS LONG AS YOU CALL BEFORE 4:00 PM  At the [facility], you and your health needs are our priority. As part of our continuing mission to provide you with exceptional heart care, we have created designated Provider Care Teams. These Care Teams include your primary Cardiologist (physician) and Advanced Practice Providers (APPs- Physician Assistants and Nurse Practitioners) who all work together to provide you with the care you need, when you need it.   You may see any of the following providers on your designated Care Team at your next follow up: Dr Glori Bickers Dr Loralie Champagne Dr. Roxana Hires, NP Lyda Jester, [place] [facility] [place], [place] Forestine Na, NP Audry Riles, PharmD   Please be sure to bring in all your medications bottles to every appointment.    Thank you for choosing [facility]

## 2022-11-01 NOTE — Progress Notes (Signed)
Echocardiogram 2D Echocardiogram has been performed.  Elaine [name] 11/01/2022, 10:53 AM

## 2022-11-01 NOTE — Progress Notes (Signed)
ID:  Elaine [name], DOB 1941/06/09, MRN [id number]  Provider location: [facility] Type of Visit:  Heart Failure Follow up  PCP:  Marton Redwood, MD  Primary Cardiologist:  Glenetta Hew, MD EP: Dr Tomasa Blase HF Cardiologist: Dr. Aundra Dubin   HPI: Elaine [name] is a 82 y.o. with history of CAD s/p CABG, COPD, CKD stage 3, and chronic systolic systolic CHF/ischemic cardiomyopathy.  She was referred by Dr. Ellyn Hack for evaluation of CHF.  Patient was admitted in 2/15 with atrial fibrillation/RVR and chest pain.  Cath showed severe 3VD and she had CABG x 3.  Echo in 2/15 showed EF down to 20-25%. In 2018, it appears that she went back into atrial fibrillation and has remained in atrial fibrillation persistently.  DCCV was not attempted.   Most recent echo in 10/20 showed EF < 20% with normal RV.  She had a St Jude CRT-D device implanted in 11/20.  She says that she feels "90%" better with CRT.  She is followed by Dr. Hollie Salk with nephrology.   She has been in atrial fibrillation persistently, I attempted DCCV while on amiodarone but she did not convert.  Therefore, I stopped amiodarone.    Echo in 3/21 showed EF 20% with diffuse hypokinesis, mildly dilated RV with mildly decreased systolic function, PASP 49 mmHg, dilated IVC.  In 9/21, she had AV nodal ablation.    She stopped empagliflozin due to UTIs and yeast infections.   Echo 12/22 EF 20-25%, mildly decreased RV function.   She was admitted in 10/23 with E faecalis UTI.  She was found to have T8/9 discitis/osteomyelitis.  TEE showed small TV vegetation that did not appear to involve her device.  Other findings were EF 30%, PASP 60 mmHg, mild MR, moderate-severe TR.  EP thought she was too high risk for device extraction.  She was treated with 6 wks of IV ampicillin and ceftriaxone.  She is now on amoxicillin for suppression.   Echo was done today and reviewed, EF 25-30%, mild LV dilation, mild RV dysfunction, mild MR, moderate  TR, IVC dilated. .   Today she returns for HF followup.  She stopped her KCl due to cramping. She is short of breath walking 30-40 feet.  Weight is down 3 lbs.  No chest pain.  No lightheadedness or palpitations.  Chronic mild orthopnea.    St Jude device interrogation: 97% BiV pacing, no VT, thoracic mpedance recently decreased.   Labs (2/21): K 4.5, creatinine 2.08 => 2.99, hgb 12.8 Labs (7/21): K 4, creatinine 2.3, TSH normal Labs (12/21): K 4, creatinine 2.31, BNP 671 Labs (1/22): K 4.1, creatinine 2.48  Labs (3/22): LDL 77 Labs (6/22): K 4.9, creatinine 2.26 Labs (10/22): K 4.8, creatinine 2.41 Labs (12/22): LDL 62, hgb 13.5 Labs (2/23): K 4.4, creatinine 2.28 Labs (11/11/21): K 5.8  Labs (3/23): BUN 52 creatinine 2.5 K 4.7  Labs (5/23): K 4.2, creatinine 2.17  Labs (8/23): K 3.7, creatinine 2.11, hgb 12.5 Labs (12/23): K 4, creatinine 2.14, hgb 10.4 Labs (1/24): K 3.2, creatinine 2.35  PMH: 1. CAD: s/p CABG in 2/15 with LIMA-LAD, SVG-OM2, SVG-PLV.  2. LBBB: Chronic.  3. Atrial fibrillation: Persistent since 2018.  - LA appendage clip with CABG.  4. H/o aspiration PNA 5. Type 2 diabetes 6. COPD 7. CKD stage 4 8. Chronic systolic CHF: Ischemic cardiomyopathy.  She has a St Jude BiV ICD.  - Echo (2/15): EF 20-25%.  - Echo (5/15): EF 40-45% -  Echo (10/20): EF <20%, mild LVH, normal RV size and systolic function.  - Echo (3/21): EF 20% with diffuse hypokinesis, mildly dilated RV with mildly decreased systolic function, PASP 49 mmHg, dilated IVC.  - AV nodal ablation - Echo (12/22): EF 20-25%, mildly decreased RV function. - TEE (10/23): Small TV vegetation that did not appear to involve her device.  Other findings were EF 30%, PASP 60 mmHg, mild MR, moderate-severe TR - Echo (3/24): EF 25-30%, mild LV dilation, mild RV dysfunction, mild MR, moderate TR, IVC dilated.  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 She also had T8/T9 discitis/osteomyelitis.     Social History   Socioeconomic History   Marital status: Widowed    Spouse name: Not on file   Number of children: Not on file   Years of education: Not on file   Highest education level: Not on file  Occupational History   Occupation: retired  Tobacco Use   Smoking status: Former    Packs/day: 0.50    Years: 39.00    Total pack years: 19.50    Types: Cigarettes    Quit date: 08/28/1993    Years since quitting: 29.1   Smokeless tobacco: Never  Vaping Use   Vaping Use: Never used  Substance and Sexual Activity   Alcohol use: No    Comment: quit 95   Drug use: No   Sexual activity: Not on file  Other Topics Concern   Not on file  Social History Narrative   Lives with husband and son in [place].  She is currently retired.   Former smoker quit in 1995.   Social Determinants of Health   Financial Resource Strain: Medium Risk (11/25/2019)   Overall Financial Resource Strain (CARDIA)    Difficulty of Paying Living Expenses: Somewhat hard  Food Insecurity: No Food Insecurity (11/25/2019)   Hunger Vital Sign    Worried About Running Out of Food in the Last Year: Never true    Ran Out of Food in the Last Year: Never true  Transportation Needs: No Transportation Needs (11/25/2019)   PRAPARE - Hydrologist (Medical): No    Lack of Transportation (Non-Medical): No  Physical Activity: Not on file  Stress: Not on file  Social Connections: Not on file  Intimate Partner Violence: Not on file   Family History  Adopted: Yes  Problem Relation Age of Onset   Other Mother    Hypertension Mother    Colon cancer Neg Hx    Esophageal cancer Neg Hx    Rectal cancer Neg Hx    Stomach cancer Neg Hx    Current Outpatient Medications  Medication Sig Dispense Refill   acetaminophen (TYLENOL) 650 MG CR tablet Take 1,300 mg by mouth 2 (two) times daily.     albuterol (VENTOLIN HFA) 108 (90 Base) MCG/ACT inhaler Inhale 2 puffs into the lungs every 6 (six)  hours as needed for wheezing or shortness of breath.     amoxicillin (AMOXIL) 500 MG capsule Take 2 capsules (1,000 mg total) by mouth 2 (two) times daily. 120 capsule 11   apixaban (ELIQUIS) 2.5 MG TABS tablet Take 2.5 mg by mouth 2 (two) times daily.     Coenzyme Q10 (COQ-10) 100 MG CAPS Take 100 mg by mouth in the morning and at bedtime.      colchicine 0.6 MG tablet Take 0.6 mg by mouth daily as needed (Gout).      Cranberry 250 MG  TABS Take 500 mg by mouth 2 (two) times daily.     Dulaglutide (TRULICITY [place]) Inject 1.5 mg into the skin once a week. Tuesday     ELDERBERRY PO Take 1 tablet by mouth 2 (two) times daily.     ezetimibe (ZETIA) 10 MG tablet Take 10 mg by mouth daily.     ferrous sulfate 325 (65 FE) MG tablet Take 325 mg by mouth every other day.     isosorbide-hydrALAZINE (BIDIL) 20-37.5 MG tablet Take 0.5 tablets by mouth 3 (three) times daily. 135 tablet 3   levothyroxine (SYNTHROID) 112 MCG tablet Take 1 tablet (112 mcg total) by mouth daily before breakfast. 30 tablet 12   metoprolol succinate (TOPROL-XL) 50 MG 24 hr tablet Take 1 tablet (50 mg total) by mouth 2 (two) times daily. Take with or immediately following a meal. 60 tablet 0   Multiple Vitamins-Minerals (MULTI FOR HER 50+ PO) Take 1 tablet by mouth daily.     Multiple Vitamins-Minerals (PRESERVISION AREDS 2) CAPS Take 1 capsule by mouth 2 (two) times daily.     rosuvastatin (CRESTOR) 5 MG tablet Take 5 mg by mouth every other day.      spironolactone (ALDACTONE) 25 MG tablet Take 0.5 tablets (12.5 mg total) by mouth daily. 45 tablet 3   umeclidinium-vilanterol (ANORO ELLIPTA) 62.5-25 MCG/INH AEPB Inhale 1 puff into the lungs daily.     potassium chloride SA (KLOR-CON M) 20 MEQ tablet Take 2 tablets (40 mEq total) by mouth daily. 180 tablet 3   torsemide (DEMADEX) 20 MG tablet Take 4 tablets (80 mg total) by mouth every morning AND 2 tablets (40 mg total) every evening. 200 tablet 3   No current facility-administered  medications for this encounter.   Wt Readings from Last 3 Encounters:  11/01/22 101.3 kg (223 lb 6.4 oz)  09/08/22 102.5 kg (226 lb)  08/22/22 99.2 kg (218 lb 9.6 oz)   General: NAD Neck: JVP 10-12 cm, no thyromegaly or thyroid nodule.  Lungs: Clear to auscultation bilaterally with normal respiratory effort. CV: Nondisplaced PMI.  Heart regular S1/S2, no S3/S4, 2/6 HSM LLSB.  1+ ankle edema.  No carotid bruit.  Normal pedal pulses.  Abdomen: Soft, nontender, no hepatosplenomegaly, no distention.  Skin: Intact without lesions or rashes.  Neurologic: Alert and oriented x 3.  Psych: Normal affect. Extremities: No clubbing or cyanosis.  HEENT: Normal.   Assessment/Plan: 1.Chronic systolic CHF: Ischemic cardiomyopathy.  St Jude CRT-D device, now s/p AV nodal ablation.  Echo in 3/21 showed EF 20% with diffuse hypokinesis, mildly dilated RV with mildly decreased systolic function, PASP 49 mmHg, dilated IVC.  Echo 12/22 showed EF 20-25%, mildly decreased RV function.  TEE in 10/23 with EF 30%, PASP 60 mmHg, mild MR, moderate-severe TR, small TV vegetation.  Echo today showed  EF 25-30%, mild LV dilation, mild RV dysfunction, mild MR, moderate TR, IVC dilated. NYHA class III symptoms.  She is volume overloaded by exam.  - Increase torsemide to 80 mg qam/40 qpm.  BMET/BNP today and BMET in 10 days.  - Restart KCl 40 mEq daily.  - Start spironolactone 12.5 mg daily.  - She was unable to tolerate Bidil 1 tab tid, but she does fine with Bidil 1/2 tab tid, so will continue.  - Continue Toprol XL 50 mg bid.    - Off empagliflozin due to yeast infection and UTI.  2. CAD: S/p CABG in 2015. No chest pain. - No ASA given apixaban use.  -  Continue Crestor 5 daily + Zetia. 3. Atrial fibrillation: Chronic now, looks like it has gone on since 2018.  Unable to cardiovert her on amiodarone so it was stopped.  At this point Afib chronic. She is now s/p AVN ablation to allow more BiV pacing. Rate controlled.   -  Continue apixaban 2.5 mg bid.  4. CKD Stage 4: SCr baseline 2.3-2.5.  - BMET today.  5. 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  Also with T8/9 discitis/osteomyelitis.  - She remains on suppressive amoxicillin per ID.   Follow up 3 wks with APP.   Signed, Loralie Champagne, MD  11/01/2022  [facility] [address] [facility] [place] [address] [phone] (office) [phone] (fax)

## 2022-11-02 ENCOUNTER — Encounter: Payer: Self-pay | Admitting: Radiology

## 2022-11-02 ENCOUNTER — Telehealth (HOSPITAL_COMMUNITY): Payer: Self-pay

## 2022-11-02 NOTE — Telephone Encounter (Signed)
Patient aware of labs. Lab schedule updated.

## 2022-11-06 ENCOUNTER — Ambulatory Visit: Payer: 59 | Attending: Cardiology

## 2022-11-06 DIAGNOSIS — Z95810 Presence of automatic (implantable) cardiac defibrillator: Secondary | ICD-10-CM

## 2022-11-06 DIAGNOSIS — I5022 Chronic systolic (congestive) heart failure: Secondary | ICD-10-CM

## 2022-11-07 ENCOUNTER — Ambulatory Visit
Admission: RE | Admit: 2022-11-07 | Discharge: 2022-11-07 | Disposition: A | Discharge status: Home / Self Care | Payer: 59 | Source: Ambulatory Visit | Attending: Internal Medicine | Admitting: Internal Medicine

## 2022-11-07 ENCOUNTER — Telehealth: Payer: Self-pay

## 2022-11-07 DIAGNOSIS — Z1231 Encounter for screening mammogram for malignant neoplasm of breast: Secondary | ICD-10-CM

## 2022-11-07 NOTE — Progress Notes (Signed)
Remote ICD transmission.   

## 2022-11-07 NOTE — Telephone Encounter (Signed)
Remote ICM transmission received.  Attempted call to patient regarding ICM remote transmission and no answer.  

## 2022-11-07 NOTE — Progress Notes (Signed)
EPIC Encounter for ICM Monitoring  Patient Name: Elaine [name] is a 82 y.o. female Date: 11/07/2022 Primary Care Physican: Ginger Organ., MD Primary Cardiologist: [place] Electrophysiologist: Cyndi Bender Pacing: 97% 04/26/2022 Weight: 228 lbs 11/01/2022 Office Weight: 223 lbs                                                            Attempted call to patient and unable to reach.   Transmission reviewed.       CorVue thoracic impedance normal but was suggesting possible fluid accumulation from 2/19-3/4.    Prescribed:   Torsemide 20 mg take 4 tablets (80 mg total) by mouth every morning and 2 tablets (40 mg total) every evening.   Potassium 20 mEq take 2 tablet(s) (40 mEq total) by mouth daily Spironolactone 25 mg take 0.5 tablet (12.5 mg total) daily   Labs: 11/10/2022 BMET scheduled 11/01/2022 Creatinine 1.90, BUN 39, Potassium 2.9, Sodium 137, GFR 26 09/08/2022 Creatinine 2.35, BUN 58, Potassium 3.2, Sodium 135 08/18/2022 Creatinine 2.67, BUN 85, Potassium 3.3, Sodium 137 08/08/2022 Creatinine 2.14, BUN 64, Potassium 4.0, Sodium 140, GFR 23 06/10/2022 Creatinine 1.73, BUN 43, Potassium 2.8, Sodium 140, GFR 29 A complete set of results can be found in Results Review.   Recommendations:  Unable to reach.     Follow-up plan: ICM clinic phone appointment on 12/11/2022.   91 day device clinic remote transmission 12/06/2022.     EP/Cardiology Office Visits:  11/22/2022 with HF clinic.  02/21/2023 with Dr Ellyn Hack.   Recall 07/02/2022 with Dr Curt Bears.   Copy of ICM check sent to Dr. Curt Bears.   3 month ICM trend: 11/05/2022.    12-14 Month ICM trend:     [name], RN 11/07/2022 9:37 AM

## 2022-11-10 ENCOUNTER — Ambulatory Visit (HOSPITAL_COMMUNITY)
Admission: RE | Admit: 2022-11-10 | Discharge: 2022-11-10 | Disposition: A | Discharge status: Home / Self Care | Payer: 59 | Source: Ambulatory Visit | Attending: Cardiology | Admitting: Cardiology

## 2022-11-10 DIAGNOSIS — I5022 Chronic systolic (congestive) heart failure: Secondary | ICD-10-CM | POA: Insufficient documentation

## 2022-11-10 LAB — BASIC METABOLIC PANEL
Anion gap: 13 (ref 5–15)
BUN: 60 mg/dL — ABNORMAL HIGH (ref 8–23)
CO2: 26 mmol/L (ref 22–32)
Calcium: 10 mg/dL (ref 8.9–10.3)
Chloride: 98 mmol/L (ref 98–111)
Creatinine, Ser: 2.28 mg/dL — ABNORMAL HIGH (ref 0.44–1.00)
GFR, Estimated: 21 mL/min — ABNORMAL LOW (ref >60)
Glucose, Bld: 164 mg/dL — ABNORMAL HIGH (ref 70–99)
Potassium: 5 mmol/L (ref 3.5–5.1)
Sodium: 137 mmol/L (ref 135–145)

## 2022-11-13 ENCOUNTER — Other Ambulatory Visit (HOSPITAL_COMMUNITY): Payer: 59

## 2022-11-13 ENCOUNTER — Telehealth (HOSPITAL_COMMUNITY): Payer: Self-pay

## 2022-11-13 NOTE — Telephone Encounter (Signed)
Patient aware of labs and verbalized understanding with medication changes

## 2022-11-15 ENCOUNTER — Other Ambulatory Visit (HOSPITAL_COMMUNITY): Payer: Self-pay

## 2022-11-15 ENCOUNTER — Ambulatory Visit (HOSPITAL_COMMUNITY)
Admission: RE | Admit: 2022-11-15 | Discharge: 2022-11-15 | Disposition: A | Discharge status: Home / Self Care | Payer: 59 | Source: Ambulatory Visit | Attending: Cardiology | Admitting: Cardiology

## 2022-11-15 DIAGNOSIS — I5022 Chronic systolic (congestive) heart failure: Secondary | ICD-10-CM | POA: Insufficient documentation

## 2022-11-15 LAB — BASIC METABOLIC PANEL
Anion gap: 11 (ref 5–15)
BUN: 43 mg/dL — ABNORMAL HIGH (ref 8–23)
CO2: 24 mmol/L (ref 22–32)
Calcium: 9.4 mg/dL (ref 8.9–10.3)
Chloride: 98 mmol/L (ref 98–111)
Creatinine, Ser: 2.11 mg/dL — ABNORMAL HIGH (ref 0.44–1.00)
GFR, Estimated: 23 mL/min — ABNORMAL LOW (ref >60)
Glucose, Bld: 102 mg/dL — ABNORMAL HIGH (ref 70–99)
Potassium: 4.4 mmol/L (ref 3.5–5.1)
Sodium: 133 mmol/L — ABNORMAL LOW (ref 135–145)

## 2022-11-17 NOTE — Progress Notes (Signed)
ID:  Elaine [name], DOB 1941/06/09, MRN [id number]  Provider location: [facility] Type of Visit:  Heart Failure Follow up  PCP:  Marton Redwood, MD  Primary Cardiologist:  Glenetta Hew, MD EP: Dr Tomasa Blase HF Cardiologist: Dr. Aundra Dubin   HPI: Elaine [name] is a 82 y.o. with history of CAD s/p CABG, COPD, CKD stage 3, and chronic systolic systolic CHF/ischemic cardiomyopathy.  She was referred by Dr. Ellyn Hack for evaluation of CHF.  Patient was admitted in 2/15 with atrial fibrillation/RVR and chest pain.  Cath showed severe 3VD and she had CABG x 3.  Echo in 2/15 showed EF down to 20-25%. In 2018, it appears that she went back into atrial fibrillation and has remained in atrial fibrillation persistently.  DCCV was not attempted.   Most recent echo in 10/20 showed EF < 20% with normal RV.  She had a St Jude CRT-D device implanted in 11/20.  She says that she feels "90%" better with CRT.  She is followed by Dr. Hollie Salk with nephrology.   She has been in atrial fibrillation persistently, I attempted DCCV while on amiodarone but she did not convert.  Therefore, I stopped amiodarone.    Echo in 3/21 showed EF 20% with diffuse hypokinesis, mildly dilated RV with mildly decreased systolic function, PASP 49 mmHg, dilated IVC.  In 9/21, she had AV nodal ablation.    She stopped empagliflozin due to UTIs and yeast infections.   Echo 12/22 EF 20-25%, mildly decreased RV function.   She was admitted in 10/23 with E faecalis UTI.  She was found to have T8/9 discitis/osteomyelitis.  TEE showed small TV vegetation that did not appear to involve her device.  Other findings were EF 30%, PASP 60 mmHg, mild MR, moderate-severe TR.  EP thought she was too high risk for device extraction.  She was treated with 6 wks of IV ampicillin and ceftriaxone.  She is now on amoxicillin for suppression.   Echo was done today and reviewed, EF 25-30%, mild LV dilation, mild RV dysfunction, mild MR, moderate  TR, IVC dilated. .   Today she returns for HF followup.  She stopped her KCl due to cramping. She is short of breath walking 30-40 feet.  Weight is down 3 lbs.  No chest pain.  No lightheadedness or palpitations.  Chronic mild orthopnea.    St Jude device interrogation: 97% BiV pacing, no VT, thoracic mpedance recently decreased.   Labs (2/21): K 4.5, creatinine 2.08 => 2.99, hgb 12.8 Labs (7/21): K 4, creatinine 2.3, TSH normal Labs (12/21): K 4, creatinine 2.31, BNP 671 Labs (1/22): K 4.1, creatinine 2.48  Labs (3/22): LDL 77 Labs (6/22): K 4.9, creatinine 2.26 Labs (10/22): K 4.8, creatinine 2.41 Labs (12/22): LDL 62, hgb 13.5 Labs (2/23): K 4.4, creatinine 2.28 Labs (11/11/21): K 5.8  Labs (3/23): BUN 52 creatinine 2.5 K 4.7  Labs (5/23): K 4.2, creatinine 2.17  Labs (8/23): K 3.7, creatinine 2.11, hgb 12.5 Labs (12/23): K 4, creatinine 2.14, hgb 10.4 Labs (1/24): K 3.2, creatinine 2.35  PMH: 1. CAD: s/p CABG in 2/15 with LIMA-LAD, SVG-OM2, SVG-PLV.  2. LBBB: Chronic.  3. Atrial fibrillation: Persistent since 2018.  - LA appendage clip with CABG.  4. H/o aspiration PNA 5. Type 2 diabetes 6. COPD 7. CKD stage 4 8. Chronic systolic CHF: Ischemic cardiomyopathy.  She has a St Jude BiV ICD.  - Echo (2/15): EF 20-25%.  - Echo (5/15): EF 40-45% -  Echo (10/20): EF <20%, mild LVH, normal RV size and systolic function.  - Echo (3/21): EF 20% with diffuse hypokinesis, mildly dilated RV with mildly decreased systolic function, PASP 49 mmHg, dilated IVC.  - AV nodal ablation - Echo (12/22): EF 20-25%, mildly decreased RV function. - TEE (10/23): Small TV vegetation that did not appear to involve her device.  Other findings were EF 30%, PASP 60 mmHg, mild MR, moderate-severe TR - Echo (3/24): EF 25-30%, mild LV dilation, mild RV dysfunction, mild MR, moderate TR, IVC dilated.  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 She also had T8/T9 discitis/osteomyelitis.     Social History   Socioeconomic History   Marital status: Widowed    Spouse name: Not on file   Number of children: Not on file   Years of education: Not on file   Highest education level: Not on file  Occupational History   Occupation: retired  Tobacco Use   Smoking status: Former    Packs/day: 0.50    Years: 39.00    Additional pack years: 0.00    Total pack years: 19.50    Types: Cigarettes    Quit date: 08/28/1993    Years since quitting: 29.2   Smokeless tobacco: Never  Vaping Use   Vaping Use: Never used  Substance and Sexual Activity   Alcohol use: No    Comment: quit 95   Drug use: No   Sexual activity: Not on file  Other Topics Concern   Not on file  Social History Narrative   Lives with husband and son in [place].  She is currently retired.   Former smoker quit in 1995.   Social Determinants of Health   Financial Resource Strain: Medium Risk (11/25/2019)   Overall Financial Resource Strain (CARDIA)    Difficulty of Paying Living Expenses: Somewhat hard  Food Insecurity: No Food Insecurity (11/25/2019)   Hunger Vital Sign    Worried About Running Out of Food in the Last Year: Never true    Ran Out of Food in the Last Year: Never true  Transportation Needs: No Transportation Needs (11/25/2019)   PRAPARE - Hydrologist (Medical): No    Lack of Transportation (Non-Medical): No  Physical Activity: Not on file  Stress: Not on file  Social Connections: Not on file  Intimate Partner Violence: Not on file   Family History  Adopted: Yes  Problem Relation Age of Onset   Other Mother    Hypertension Mother    Colon cancer Neg Hx    Esophageal cancer Neg Hx    Rectal cancer Neg Hx    Stomach cancer Neg Hx    Current Outpatient Medications  Medication Sig Dispense Refill   acetaminophen (TYLENOL) 650 MG CR tablet Take 1,300 mg by mouth 2 (two) times daily.     albuterol (VENTOLIN HFA) 108 (90 Base) MCG/ACT inhaler Inhale 2  puffs into the lungs every 6 (six) hours as needed for wheezing or shortness of breath.     amoxicillin (AMOXIL) 500 MG capsule Take 2 capsules (1,000 mg total) by mouth 2 (two) times daily. 120 capsule 11   apixaban (ELIQUIS) 2.5 MG TABS tablet Take 2.5 mg by mouth 2 (two) times daily.     Coenzyme Q10 (COQ-10) 100 MG CAPS Take 100 mg by mouth in the morning and at bedtime.      colchicine 0.6 MG tablet Take 0.6 mg by mouth daily as needed (Gout).  Cranberry 250 MG TABS Take 500 mg by mouth 2 (two) times daily.     Dulaglutide (TRULICITY [place]) Inject 1.5 mg into the skin once a week. Tuesday     ELDERBERRY PO Take 1 tablet by mouth 2 (two) times daily.     ezetimibe (ZETIA) 10 MG tablet Take 10 mg by mouth daily.     ferrous sulfate 325 (65 FE) MG tablet Take 325 mg by mouth every other day.     isosorbide-hydrALAZINE (BIDIL) 20-37.5 MG tablet Take 0.5 tablets by mouth 3 (three) times daily. 135 tablet 3   levothyroxine (SYNTHROID) 112 MCG tablet Take 1 tablet (112 mcg total) by mouth daily before breakfast. 30 tablet 12   metoprolol succinate (TOPROL-XL) 50 MG 24 hr tablet Take 1 tablet (50 mg total) by mouth 2 (two) times daily. Take with or immediately following a meal. 60 tablet 0   Multiple Vitamins-Minerals (MULTI FOR HER 50+ PO) Take 1 tablet by mouth daily.     Multiple Vitamins-Minerals (PRESERVISION AREDS 2) CAPS Take 1 capsule by mouth 2 (two) times daily.     potassium chloride SA (KLOR-CON M) 20 MEQ tablet Take 2 tablets (40 mEq total) by mouth daily. 180 tablet 3   rosuvastatin (CRESTOR) 5 MG tablet Take 5 mg by mouth every other day.      spironolactone (ALDACTONE) 25 MG tablet Take 0.5 tablets (12.5 mg total) by mouth daily. 45 tablet 3   torsemide (DEMADEX) 20 MG tablet Take 4 tablets (80 mg total) by mouth every morning AND 2 tablets (40 mg total) every evening. 200 tablet 3   umeclidinium-vilanterol (ANORO ELLIPTA) 62.5-25 MCG/INH AEPB Inhale 1 puff into the lungs daily.      No current facility-administered medications for this visit.   Wt Readings from Last 3 Encounters:  11/01/22 101.3 kg (223 lb 6.4 oz)  09/08/22 102.5 kg (226 lb)  08/22/22 99.2 kg (218 lb 9.6 oz)   General: NAD Neck: JVP 10-12 cm, no thyromegaly or thyroid nodule.  Lungs: Clear to auscultation bilaterally with normal respiratory effort. CV: Nondisplaced PMI.  Heart regular S1/S2, no S3/S4, 2/6 HSM LLSB.  1+ ankle edema.  No carotid bruit.  Normal pedal pulses.  Abdomen: Soft, nontender, no hepatosplenomegaly, no distention.  Skin: Intact without lesions or rashes.  Neurologic: Alert and oriented x 3.  Psych: Normal affect. Extremities: No clubbing or cyanosis.  HEENT: Normal.   Assessment/Plan: 1.Chronic systolic CHF: Ischemic cardiomyopathy.  St Jude CRT-D device, now s/p AV nodal ablation.  Echo in 3/21 showed EF 20% with diffuse hypokinesis, mildly dilated RV with mildly decreased systolic function, PASP 49 mmHg, dilated IVC.  Echo 12/22 showed EF 20-25%, mildly decreased RV function.  TEE in 10/23 with EF 30%, PASP 60 mmHg, mild MR, moderate-severe TR, small TV vegetation.  Echo today showed  EF 25-30%, mild LV dilation, mild RV dysfunction, mild MR, moderate TR, IVC dilated. NYHA class III symptoms.  She is volume overloaded by exam.  - Increase torsemide to 80 mg qam/40 qpm.  BMET/BNP today and BMET in 10 days.  - Restart KCl 40 mEq daily.  - Start spironolactone 12.5 mg daily.  - She was unable to tolerate Bidil 1 tab tid, but she does fine with Bidil 1/2 tab tid, so will continue.  - Continue Toprol XL 50 mg bid.    - Off empagliflozin due to yeast infection and UTI.  2. CAD: S/p CABG in 2015. No chest pain. - No ASA given  apixaban use.  - Continue Crestor 5 daily + Zetia. 3. Atrial fibrillation: Chronic now, looks like it has gone on since 2018.  Unable to cardiovert her on amiodarone so it was stopped.  At this point Afib chronic. She is now s/p AVN ablation to allow more  BiV pacing. Rate controlled.   - Continue apixaban 2.5 mg bid.  4. CKD Stage 4: SCr baseline 2.3-2.5.  - BMET today.  5. 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  Also with T8/9 discitis/osteomyelitis.  - She remains on suppressive amoxicillin per ID.   Follow up 3 wks with APP.   Signed, Rafael Bihari, FNP  11/17/2022  [facility] [address] [facility] [place] [address] [phone] (office) [phone] (fax)

## 2022-11-22 ENCOUNTER — Ambulatory Visit (HOSPITAL_COMMUNITY)
Admission: RE | Admit: 2022-11-22 | Discharge: 2022-11-22 | Disposition: A | Discharge status: Home / Self Care | Payer: 59 | Source: Ambulatory Visit | Attending: Family Medicine | Admitting: Family Medicine

## 2022-11-22 ENCOUNTER — Encounter (HOSPITAL_COMMUNITY): Payer: Self-pay

## 2022-11-22 VITALS — BP 130/80 | HR 72 | Wt 225.0 lb

## 2022-11-22 DIAGNOSIS — Z7985 Long-term (current) use of injectable non-insulin antidiabetic drugs: Secondary | ICD-10-CM | POA: Insufficient documentation

## 2022-11-22 DIAGNOSIS — I4819 Other persistent atrial fibrillation: Secondary | ICD-10-CM | POA: Insufficient documentation

## 2022-11-22 DIAGNOSIS — J449 Chronic obstructive pulmonary disease, unspecified: Secondary | ICD-10-CM | POA: Diagnosis not present

## 2022-11-22 DIAGNOSIS — I255 Ischemic cardiomyopathy: Secondary | ICD-10-CM | POA: Insufficient documentation

## 2022-11-22 DIAGNOSIS — I5022 Chronic systolic (congestive) heart failure: Secondary | ICD-10-CM

## 2022-11-22 DIAGNOSIS — I2510 Atherosclerotic heart disease of native coronary artery without angina pectoris: Secondary | ICD-10-CM | POA: Diagnosis not present

## 2022-11-22 DIAGNOSIS — I4821 Permanent atrial fibrillation: Secondary | ICD-10-CM | POA: Diagnosis not present

## 2022-11-22 DIAGNOSIS — B952 Enterococcus as the cause of diseases classified elsewhere: Secondary | ICD-10-CM | POA: Diagnosis not present

## 2022-11-22 DIAGNOSIS — Z7901 Long term (current) use of anticoagulants: Secondary | ICD-10-CM | POA: Insufficient documentation

## 2022-11-22 DIAGNOSIS — Z79899 Other long term (current) drug therapy: Secondary | ICD-10-CM | POA: Insufficient documentation

## 2022-11-22 DIAGNOSIS — I079 Rheumatic tricuspid valve disease, unspecified: Secondary | ICD-10-CM | POA: Insufficient documentation

## 2022-11-22 DIAGNOSIS — E1122 Type 2 diabetes mellitus with diabetic chronic kidney disease: Secondary | ICD-10-CM | POA: Diagnosis not present

## 2022-11-22 DIAGNOSIS — N184 Chronic kidney disease, stage 4 (severe): Secondary | ICD-10-CM | POA: Insufficient documentation

## 2022-11-22 DIAGNOSIS — R0600 Dyspnea, unspecified: Secondary | ICD-10-CM | POA: Insufficient documentation

## 2022-11-22 LAB — BRAIN NATRIURETIC PEPTIDE: B Natriuretic Peptide: 302.1 pg/mL — ABNORMAL HIGH (ref 0.0–100.0)

## 2022-11-22 LAB — BASIC METABOLIC PANEL
Anion gap: 9 (ref 5–15)
BUN: 39 mg/dL — ABNORMAL HIGH (ref 8–23)
CO2: 25 mmol/L (ref 22–32)
Calcium: 10 mg/dL (ref 8.9–10.3)
Chloride: 101 mmol/L (ref 98–111)
Creatinine, Ser: 1.98 mg/dL — ABNORMAL HIGH (ref 0.44–1.00)
GFR, Estimated: 25 mL/min — ABNORMAL LOW (ref >60)
Glucose, Bld: 108 mg/dL — ABNORMAL HIGH (ref 70–99)
Potassium: 4.7 mmol/L (ref 3.5–5.1)
Sodium: 135 mmol/L (ref 135–145)

## 2022-11-22 LAB — CBC
HCT: 36.3 % (ref 36.0–46.0)
Hemoglobin: 12.2 g/dL (ref 12.0–15.0)
MCH: 28.9 pg (ref 26.0–34.0)
MCHC: 33.6 g/dL (ref 30.0–36.0)
MCV: 86 fL (ref 80.0–100.0)
Platelets: 177 10*3/uL (ref 150–400)
RBC: 4.22 MIL/uL (ref 3.87–5.11)
RDW: 19 % — ABNORMAL HIGH (ref 11.5–15.5)
WBC: 5.6 10*3/uL (ref 4.0–10.5)
nRBC: 0 % (ref 0.0–0.2)

## 2022-11-22 NOTE — Progress Notes (Signed)
ReDS Vest / Clip - 11/22/22 0925       ReDS Vest / Clip   Station Marker B    Ruler Value 33    ReDS Value Range Low volume    ReDS Actual Value 33    Anatomical Comments sitting

## 2022-11-22 NOTE — Patient Instructions (Signed)
Thank you for coming in today  Labs were done today, if any labs are abnormal the clinic will call you No news is good news  Medications: No changes    Follow up appointments:  Your physician recommends that you schedule a follow-up appointment in:  2 months With Dr. Kendall Flack have been given a prescription for compression hose. And a list of places who supply them. Please wear your compression hose daily, place them on as soon as you get up in the morning and remove before you go to bed at night.     Do the following things EVERYDAY: Weigh yourself in the morning before breakfast. Write it down and keep it in a log. Take your medicines as prescribed Eat low salt foods--Limit salt (sodium) to 2000 mg per day.  Stay as active as you can everyday Limit all fluids for the day to less than 2 liters   At the [facility], you and your health needs are our priority. As part of our continuing mission to provide you with exceptional heart care, we have created designated Provider Care Teams. These Care Teams include your primary Cardiologist (physician) and Advanced Practice Providers (APPs- Physician Assistants and Nurse Practitioners) who all work together to provide you with the care you need, when you need it.   You may see any of the following providers on your designated Care Team at your next follow up: Dr Glori Bickers Dr Loralie Champagne Dr. Roxana Hires, NP Lyda Jester, [place] [facility] [place], [place] Forestine Na, NP Audry Riles, PharmD   Please be sure to bring in all your medications bottles to every appointment.    Thank you for choosing [facility]  If you have any questions or concerns before your next appointment please send [place] a message through [place] or call our office at [phone].    TO LEAVE A MESSAGE FOR THE NURSE SELECT OPTION 2, PLEASE LEAVE A MESSAGE  INCLUDING: YOUR NAME DATE OF BIRTH CALL BACK NUMBER REASON FOR CALL**this is important as we prioritize the call backs  YOU WILL RECEIVE A CALL BACK THE SAME DAY AS LONG AS YOU CALL BEFORE 4:00 PM

## 2022-12-06 ENCOUNTER — Ambulatory Visit (INDEPENDENT_AMBULATORY_CARE_PROVIDER_SITE_OTHER): Payer: 59

## 2022-12-06 DIAGNOSIS — I255 Ischemic cardiomyopathy: Secondary | ICD-10-CM | POA: Diagnosis not present

## 2022-12-06 LAB — CUP PACEART REMOTE DEVICE CHECK
Battery Remaining Longevity: 54 mo
Battery Remaining Percentage: 58 %
Battery Voltage: 2.96 V
Date Time Interrogation Session: 20240409220420
HighPow Impedance: 61 Ohm
Implantable Lead Connection Status: 753985
Implantable Lead Connection Status: 753985
Implantable Lead Implant Date: 20201130
Implantable Lead Implant Date: 20201130
Implantable Lead Location: 753858
Implantable Lead Location: 753860
Implantable Pulse Generator Implant Date: 20201130
Lead Channel Impedance Value: 450 Ohm
Lead Channel Impedance Value: 540 Ohm
Lead Channel Pacing Threshold Amplitude: 0.625 V
Lead Channel Pacing Threshold Amplitude: 0.75 V
Lead Channel Pacing Threshold Pulse Width: 0.4 ms
Lead Channel Pacing Threshold Pulse Width: 0.5 ms
Lead Channel Sensing Intrinsic Amplitude: 12 mV
Lead Channel Setting Pacing Amplitude: 1.125
Lead Channel Setting Pacing Amplitude: 2.5 V
Lead Channel Setting Pacing Pulse Width: 0.5 ms
Lead Channel Setting Pacing Pulse Width: 0.5 ms
Lead Channel Setting Sensing Sensitivity: 0.5 mV
Pulse Gen Serial Number: 810000193
Zone Setting Status: 755011

## 2022-12-07 ENCOUNTER — Other Ambulatory Visit (HOSPITAL_COMMUNITY): Payer: Self-pay | Admitting: Adult Health

## 2022-12-11 ENCOUNTER — Ambulatory Visit: Payer: 59 | Attending: Cardiology

## 2022-12-11 DIAGNOSIS — I5022 Chronic systolic (congestive) heart failure: Secondary | ICD-10-CM | POA: Diagnosis not present

## 2022-12-11 DIAGNOSIS — Z95810 Presence of automatic (implantable) cardiac defibrillator: Secondary | ICD-10-CM | POA: Diagnosis not present

## 2022-12-12 NOTE — Progress Notes (Signed)
EPIC Encounter for ICM Monitoring  Patient Name: Elaine [name] is a 82 y.o. female Date: 12/12/2022 Primary Care Physican: Cleatis Polka., MD Primary Cardiologist: Harding/McLean Electrophysiologist: Kathreen Cornfield Pacing: 97% 04/26/2022 Weight: 228 lbs 11/01/2022 Office Weight: 223 lbs                                                            Spoke with patient and heart failure questions reviewed.  Transmission results reviewed.  Pt asymptomatic for fluid accumulation.  Reports feeling well at this time and voices no complaints.      CorVue thoracic impedance suggesting normal fluid levels within the last month.    Prescribed:   Torsemide 20 mg take 4 tablets (80 mg total) by mouth every morning and 2 tablets (40 mg total) every evening.   Potassium 20 mEq take 2 tablet(s) (40 mEq total) by mouth daily Spironolactone 25 mg take 0.5 tablet (12.5 mg total) daily   Labs: 11/22/2022 Creatinine 1.98, BUN 39, Potassium 4.7, Sodium 135, GFR 25  11/15/2022 Creatinine 2.11, BUN 43, Potassium 4.4, Sodium 133, GFR 23  11/01/2022 Creatinine 1.90, BUN 39, Potassium 2.9, Sodium 137, GFR 26 09/08/2022 Creatinine 2.35, BUN 58, Potassium 3.2, Sodium 135 08/18/2022 Creatinine 2.67, BUN 85, Potassium 3.3, Sodium 137 08/08/2022 Creatinine 2.14, BUN 64, Potassium 4.0, Sodium 140, GFR 23 06/10/2022 Creatinine 1.73, BUN 43, Potassium 2.8, Sodium 140, GFR 29 A complete set of results can be found in Results Review.   Recommendations:  No changes and encouraged to call if experiencing any fluid symptoms.   Follow-up plan: ICM clinic phone appointment on 01/15/2023.   91 day device clinic remote transmission 03/07/2023.     EP/Cardiology Office Visits: 01/19/2023 with Dr Shirlee Latch.  02/21/2023 with Dr Herbie Baltimore.   Recall 07/02/2022 with Dr Elberta Fortis.   Copy of ICM check sent to Dr. Elberta Fortis.   3 month ICM trend: 12/11/2022.    12-14 Month ICM trend:     Karie Soda, RN 12/12/2022 4:06 PM

## 2023-01-02 ENCOUNTER — Ambulatory Visit: Payer: 59

## 2023-01-12 NOTE — Progress Notes (Signed)
Remote ICD transmission.   

## 2023-01-15 ENCOUNTER — Ambulatory Visit: Payer: 59 | Attending: Cardiology

## 2023-01-15 DIAGNOSIS — Z95810 Presence of automatic (implantable) cardiac defibrillator: Secondary | ICD-10-CM

## 2023-01-15 DIAGNOSIS — I5022 Chronic systolic (congestive) heart failure: Secondary | ICD-10-CM | POA: Diagnosis not present

## 2023-01-16 ENCOUNTER — Telehealth: Payer: Self-pay

## 2023-01-16 NOTE — Progress Notes (Signed)
EPIC Encounter for ICM Monitoring  Patient Name: Elaine [name] is a 82 y.o. female Date: 01/16/2023 Primary Care Physican: Cleatis Polka., MD Primary Cardiologist: Harding/McLean Electrophysiologist: Kathreen Cornfield Pacing: 98% 04/26/2022 Weight: 228 lbs 11/01/2022 Office Weight: 223 lbs                                                            Attempted call to patient and unable to reach.  Left detailed message per DPR regarding transmission. Transmission reviewed.    CorVue thoracic impedance suggesting normal fluid levels within the last month.    Prescribed:   Torsemide 20 mg take 4 tablets (80 mg total) by mouth every morning and 2 tablets (40 mg total) every evening.   Potassium 20 mEq take 2 tablet(s) (40 mEq total) by mouth daily Spironolactone 25 mg take 0.5 tablet (12.5 mg total) daily   Labs: 11/22/2022 Creatinine 1.98, BUN 39, Potassium 4.7, Sodium 135, GFR 25  11/15/2022 Creatinine 2.11, BUN 43, Potassium 4.4, Sodium 133, GFR 23  11/01/2022 Creatinine 1.90, BUN 39, Potassium 2.9, Sodium 137, GFR 26 09/08/2022 Creatinine 2.35, BUN 58, Potassium 3.2, Sodium 135 08/18/2022 Creatinine 2.67, BUN 85, Potassium 3.3, Sodium 137 08/08/2022 Creatinine 2.14, BUN 64, Potassium 4.0, Sodium 140, GFR 23 06/10/2022 Creatinine 1.73, BUN 43, Potassium 2.8, Sodium 140, GFR 29 A complete set of results can be found in Results Review.   Recommendations:  Left voice mail with ICM number and encouraged to call if experiencing any fluid symptoms.   Follow-up plan: ICM clinic phone appointment on 02/19/2023.   91 day device clinic remote transmission 03/07/2023.     EP/Cardiology Office Visits: 01/19/2023 with Dr Shirlee Latch.  02/21/2023 with Dr Herbie Baltimore.  Left message to contact EP scheduler to schedule yearly OV with Dr Elberta Fortis.   Recall 07/02/2022 with Dr Elberta Fortis.   Copy of ICM check sent to Dr. Elberta Fortis.   3 month ICM trend: 01/15/2023.    12-14 Month ICM trend:     Karie Soda,  RN 01/16/2023 1:57 PM

## 2023-01-16 NOTE — Progress Notes (Signed)
Spoke with patient and heart failure questions reviewed.  Transmission results reviewed.  Pt asymptomatic for fluid accumulation.  Reports feeling well at this time and voices no complaints.  She says she drinks a lot of fluids which may contribute to decreased impedance.  No changes and encouraged to call if experiencing any fluid symptoms.

## 2023-01-16 NOTE — Telephone Encounter (Signed)
Remote ICM transmission received.  Attempted call to patient regarding ICM remote transmission and left detailed message per DPR.  Advised to return call for any fluid symptoms or questions.   Left number for EP scheduler to call and make yearly appt with Dr Elberta Fortis.

## 2023-01-19 ENCOUNTER — Encounter (HOSPITAL_COMMUNITY): Payer: Self-pay | Admitting: Cardiology

## 2023-01-19 ENCOUNTER — Ambulatory Visit (HOSPITAL_COMMUNITY)
Admission: RE | Admit: 2023-01-19 | Discharge: 2023-01-19 | Disposition: A | Discharge status: Home / Self Care | Payer: 59 | Source: Ambulatory Visit | Attending: Cardiology | Admitting: Cardiology

## 2023-01-19 VITALS — BP 102/60 | HR 74 | Wt 218.2 lb

## 2023-01-19 DIAGNOSIS — Z951 Presence of aortocoronary bypass graft: Secondary | ICD-10-CM | POA: Insufficient documentation

## 2023-01-19 DIAGNOSIS — Z5986 Financial insecurity: Secondary | ICD-10-CM | POA: Insufficient documentation

## 2023-01-19 DIAGNOSIS — N184 Chronic kidney disease, stage 4 (severe): Secondary | ICD-10-CM | POA: Insufficient documentation

## 2023-01-19 DIAGNOSIS — Z79899 Other long term (current) drug therapy: Secondary | ICD-10-CM | POA: Insufficient documentation

## 2023-01-19 DIAGNOSIS — I5022 Chronic systolic (congestive) heart failure: Secondary | ICD-10-CM | POA: Diagnosis present

## 2023-01-19 DIAGNOSIS — Z8249 Family history of ischemic heart disease and other diseases of the circulatory system: Secondary | ICD-10-CM | POA: Insufficient documentation

## 2023-01-19 DIAGNOSIS — I2510 Atherosclerotic heart disease of native coronary artery without angina pectoris: Secondary | ICD-10-CM | POA: Insufficient documentation

## 2023-01-19 DIAGNOSIS — Z7985 Long-term (current) use of injectable non-insulin antidiabetic drugs: Secondary | ICD-10-CM | POA: Diagnosis not present

## 2023-01-19 DIAGNOSIS — Z87891 Personal history of nicotine dependence: Secondary | ICD-10-CM | POA: Diagnosis not present

## 2023-01-19 DIAGNOSIS — I081 Rheumatic disorders of both mitral and tricuspid valves: Secondary | ICD-10-CM | POA: Diagnosis not present

## 2023-01-19 DIAGNOSIS — J449 Chronic obstructive pulmonary disease, unspecified: Secondary | ICD-10-CM | POA: Insufficient documentation

## 2023-01-19 DIAGNOSIS — Z7901 Long term (current) use of anticoagulants: Secondary | ICD-10-CM | POA: Insufficient documentation

## 2023-01-19 DIAGNOSIS — Z95810 Presence of automatic (implantable) cardiac defibrillator: Secondary | ICD-10-CM | POA: Diagnosis not present

## 2023-01-19 DIAGNOSIS — I4819 Other persistent atrial fibrillation: Secondary | ICD-10-CM | POA: Diagnosis not present

## 2023-01-19 DIAGNOSIS — I255 Ischemic cardiomyopathy: Secondary | ICD-10-CM | POA: Diagnosis not present

## 2023-01-19 DIAGNOSIS — E1122 Type 2 diabetes mellitus with diabetic chronic kidney disease: Secondary | ICD-10-CM | POA: Insufficient documentation

## 2023-01-19 DIAGNOSIS — I5042 Chronic combined systolic (congestive) and diastolic (congestive) heart failure: Secondary | ICD-10-CM | POA: Diagnosis not present

## 2023-01-19 LAB — CBC
HCT: 36.2 % (ref 36.0–46.0)
Hemoglobin: 12.1 g/dL (ref 12.0–15.0)
MCH: 29.2 pg (ref 26.0–34.0)
MCHC: 33.4 g/dL (ref 30.0–36.0)
MCV: 87.2 fL (ref 80.0–100.0)
Platelets: 152 10*3/uL (ref 150–400)
RBC: 4.15 MIL/uL (ref 3.87–5.11)
RDW: 16.7 % — ABNORMAL HIGH (ref 11.5–15.5)
WBC: 6 10*3/uL (ref 4.0–10.5)
nRBC: 0 % (ref 0.0–0.2)

## 2023-01-19 LAB — BASIC METABOLIC PANEL
Anion gap: 11 (ref 5–15)
BUN: 57 mg/dL — ABNORMAL HIGH (ref 8–23)
CO2: 23 mmol/L (ref 22–32)
Calcium: 10 mg/dL (ref 8.9–10.3)
Chloride: 102 mmol/L (ref 98–111)
Creatinine, Ser: 2.09 mg/dL — ABNORMAL HIGH (ref 0.44–1.00)
GFR, Estimated: 23 mL/min — ABNORMAL LOW (ref >60)
Glucose, Bld: 130 mg/dL — ABNORMAL HIGH (ref 70–99)
Potassium: 4.1 mmol/L (ref 3.5–5.1)
Sodium: 136 mmol/L (ref 135–145)

## 2023-01-19 LAB — BRAIN NATRIURETIC PEPTIDE: B Natriuretic Peptide: 487.9 pg/mL — ABNORMAL HIGH (ref 0.0–100.0)

## 2023-01-19 MED ORDER — SPIRONOLACTONE 25 MG PO TABS: 25.0000 mg | ORAL_TABLET | Freq: Every day | ORAL | 3 refills | Status: DC

## 2023-01-19 MED ORDER — TORSEMIDE 20 MG PO TABS: ORAL_TABLET | ORAL | 3 refills | Status: DC

## 2023-01-19 MED ORDER — ROLLATOR ULTRA-LIGHT MISC: 0 refills | Status: DC

## 2023-01-19 NOTE — Progress Notes (Signed)
CSW met with patient to assist with obtaining a rolling walker. Patient has medicare and medicaid and state she has not had a rolling walker in 7 years. CSW and CMA collaborated and able to send referral to [facility] for walker. Patient and family member will follow up with Pharmacy and return call to CSW if further needs arise. Lasandra Beech, LCSW, CCSW-MCS [phone]

## 2023-01-19 NOTE — Patient Instructions (Signed)
INCREASE Spironolactone to 25 mg daily.  CHANGE Torsemide to 80 mg in the morning and 60 mg in the evening.  Labs done today, your results will be available in MyChart, we will contact you for abnormal readings.  Repeat blood work in 10 days.  Your physician recommends that you schedule a follow-up appointment in: 2 months.  If you have any questions or concerns before your next appointment please send [place] a message through [place] or call our office at [phone].    TO LEAVE A MESSAGE FOR THE NURSE SELECT OPTION 2, PLEASE LEAVE A MESSAGE INCLUDING: YOUR NAME DATE OF BIRTH CALL BACK NUMBER REASON FOR CALL**this is important as we prioritize the call backs  YOU WILL RECEIVE A CALL BACK THE SAME DAY AS LONG AS YOU CALL BEFORE 4:00 PM  At the [facility], you and your health needs are our priority. As part of our continuing mission to provide you with exceptional heart care, we have created designated Provider Care Teams. These Care Teams include your primary Cardiologist (physician) and Advanced Practice Providers (APPs- Physician Assistants and Nurse Practitioners) who all work together to provide you with the care you need, when you need it.   You may see any of the following providers on your designated Care Team at your next follow up: Dr Arvilla Meres Dr Marca Ancona Dr. Marcos Eke, NP Robbie Lis, [place] [facility] [employer], [place] Brynda Peon, NP Karle Plumber, PharmD   Please be sure to bring in all your medications bottles to every appointment.    Thank you for choosing  [facility]

## 2023-01-20 NOTE — Progress Notes (Signed)
ID:  Elaine [name], DOB 1941/07/25, MRN [id number]  Provider location: [facility] Type of Visit:  Heart Failure Follow up  PCP:  Martha Clan, MD  Primary Cardiologist:  Bryan Lemma, MD EP: Dr Linde Gillis HF Cardiologist: Dr. Shirlee Latch   HPI: Elaine [name] is a 82 y.o. with history of CAD s/p CABG, COPD, CKD stage 3, and chronic systolic systolic CHF/ischemic cardiomyopathy.  She was referred by Dr. Herbie Baltimore for evaluation of CHF.  Patient was admitted in 2/15 with atrial fibrillation/RVR and chest pain.  Cath showed severe 3VD and she had CABG x 3.  Echo in 2/15 showed EF down to 20-25%. In 2018, it appears that she went back into atrial fibrillation and has remained in atrial fibrillation persistently.  DCCV was not attempted.   Most recent echo in 10/20 showed EF < 20% with normal RV.  She had a St Jude CRT-D device implanted in 11/20.  She says that she feels "90%" better with CRT.  She is followed by Dr. Signe Colt with nephrology.   She has been in atrial fibrillation persistently, I attempted DCCV while on amiodarone but she did not convert.  Therefore, I stopped amiodarone.    Echo in 3/21 showed EF 20% with diffuse hypokinesis, mildly dilated RV with mildly decreased systolic function, PASP 49 mmHg, dilated IVC.  In 9/21, she had AV nodal ablation.    She stopped empagliflozin due to UTIs and yeast infections.   Echo 12/22 EF 20-25%, mildly decreased RV function.   She was admitted in 10/23 with E faecalis UTI.  She was found to have T8/9 discitis/osteomyelitis.  TEE showed small TV vegetation that did not appear to involve her device.  Other findings were EF 30%, PASP 60 mmHg, mild MR, moderate-severe TR.  EP thought she was too high risk for device extraction.  She was treated with 6 wks of IV ampicillin and ceftriaxone.  She is now on amoxicillin for suppression.   Echo 3/24 EF 25-30%, mild LV dilation, mild RV dysfunction, mild MR, moderate TR, IVC dilated. .    Today she returns for HF follow up. Weight down 7 lbs.  She uses a walker at home, here in wheelchair.  She is usually ok walking around her house.  Goes out to [employer] Gets short of breath and tired if she tries to walk longer distances.  No chest pain.  No lightheadedness.  No orthopnea/PND.    St Jude device interrogation: 98% BiV pacing, stable thoracic impedance today be recently decreased  ECG (personally reviewed) Atrial fibrillation with BiV pacing.   Labs (2/21): K 4.5, creatinine 2.08 => 2.99, hgb 12.8 Labs (7/21): K 4, creatinine 2.3, TSH normal Labs (12/21): K 4, creatinine 2.31, BNP 671 Labs (1/22): K 4.1, creatinine 2.48  Labs (3/22): LDL 77 Labs (6/22): K 4.9, creatinine 2.26 Labs (10/22): K 4.8, creatinine 2.41 Labs (12/22): LDL 62, hgb 13.5 Labs (2/23): K 4.4, creatinine 2.28 Labs (11/11/21): K 5.8  Labs (3/23): BUN 52 creatinine 2.5 K 4.7  Labs (5/23): K 4.2, creatinine 2.17  Labs (8/23): K 3.7, creatinine 2.11, hgb 12.5 Labs (12/23): K 4, creatinine 2.14, hgb 10.4 Labs (1/24): K 3.2, creatinine 2.35 Labs (3/24): K 4.4, creatinine 2.11 => 1.98, BNP 302, hgb 12.2  PMH: 1. CAD: s/p CABG in 2/15 with LIMA-LAD, SVG-OM2, SVG-PLV.  2. LBBB: Chronic.  3. Atrial fibrillation: Persistent since 2018.  - LA appendage clip with CABG.  4. H/o aspiration PNA 5.  Type 2 diabetes 6. COPD 7. CKD stage 4 8. Chronic systolic CHF: Ischemic cardiomyopathy.  She has a St Jude BiV ICD.  - Echo (2/15): EF 20-25%.  - Echo (5/15): EF 40-45% - Echo (10/20): EF <20%, mild LVH, normal RV size and systolic function.  - Echo (3/21): EF 20% with diffuse hypokinesis, mildly dilated RV with mildly decreased systolic function, PASP 49 mmHg, dilated IVC.  - AV nodal ablation - Echo (12/22): EF 20-25%, mildly decreased RV function. - TEE (10/23): Small TV vegetation that did not appear to involve her device.  Other findings were EF 30%, PASP 60 mmHg, mild MR, moderate-severe TR - Echo  (3/24): EF 25-30%, mild LV dilation, mild RV dysfunction, mild MR, moderate TR, IVC dilated.  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 She also had T8/T9 discitis/osteomyelitis.   Social History   Socioeconomic History   Marital status: Widowed    Spouse name: Not on file   Number of children: Not on file   Years of education: Not on file   Highest education level: Not on file  Occupational History   Occupation: retired  Tobacco Use   Smoking status: Former    Packs/day: 0.50    Years: 39.00    Additional pack years: 0.00    Total pack years: 19.50    Types: Cigarettes    Quit date: 08/28/1993    Years since quitting: 29.4   Smokeless tobacco: Never  Vaping Use   Vaping Use: Never used  Substance and Sexual Activity   Alcohol use: No    Comment: quit 95   Drug use: No   Sexual activity: Not on file  Other Topics Concern   Not on file  Social History Narrative   Lives with husband and son in [place].  She is currently retired.   Former smoker quit in 1995.   Social Determinants of Health   Financial Resource Strain: Medium Risk (11/25/2019)   Overall Financial Resource Strain (CARDIA)    Difficulty of Paying Living Expenses: Somewhat hard  Food Insecurity: No Food Insecurity (11/25/2019)   Hunger Vital Sign    Worried About Running Out of Food in the Last Year: Never true    Ran Out of Food in the Last Year: Never true  Transportation Needs: No Transportation Needs (11/25/2019)   PRAPARE - Administrator, Civil Service (Medical): No    Lack of Transportation (Non-Medical): No  Physical Activity: Not on file  Stress: Not on file  Social Connections: Not on file  Intimate Partner Violence: Not on file   Family History  Adopted: Yes  Problem Relation Age of Onset   Other Mother    Hypertension Mother    Colon cancer Neg Hx    Esophageal cancer Neg Hx    Rectal cancer Neg Hx    Stomach cancer Neg Hx    Current Outpatient  Medications  Medication Sig Dispense Refill   acetaminophen (TYLENOL) 650 MG CR tablet Take 1,300 mg by mouth 2 (two) times daily.     albuterol (VENTOLIN HFA) 108 (90 Base) MCG/ACT inhaler Inhale 2 puffs into the lungs every 6 (six) hours as needed for wheezing or shortness of breath.     amoxicillin (AMOXIL) 500 MG capsule Take 2 capsules (1,000 mg total) by mouth 2 (two) times daily. 120 capsule 11   apixaban (ELIQUIS) 2.5 MG TABS tablet Take 2.5 mg by mouth 2 (two) times daily.     ciprofloxacin (  CIPRO) 250 MG tablet Take 250 mg by mouth 2 (two) times daily.     Coenzyme Q10 (COQ-10) 100 MG CAPS Take 100 mg by mouth in the morning and at bedtime.      colchicine 0.6 MG tablet Take 0.6 mg by mouth daily as needed (Gout).      Cranberry 250 MG TABS Take 500 mg by mouth 2 (two) times daily.     Dulaglutide (TRULICITY [place]) Inject 1.5 mg into the skin once a week. Tuesday     ELDERBERRY PO Take 1 tablet by mouth 2 (two) times daily.     ezetimibe (ZETIA) 10 MG tablet Take 10 mg by mouth daily.     ferrous sulfate 325 (65 FE) MG tablet Take 325 mg by mouth every other day.     isosorbide-hydrALAZINE (BIDIL) 20-37.5 MG tablet Take 0.5 tablets by mouth 3 (three) times daily. 135 tablet 3   levothyroxine (SYNTHROID) 112 MCG tablet Take 1 tablet (112 mcg total) by mouth daily before breakfast. 30 tablet 12   metoprolol succinate (TOPROL-XL) 50 MG 24 hr tablet Take 1 tablet (50 mg total) by mouth 2 (two) times daily. Take with or immediately following a meal. 60 tablet 0   Misc. Devices (ROLLATOR ULTRA-LIGHT) MISC UAD to ambulate DX53.81 1 each 0   Multiple Vitamins-Minerals (MULTI FOR HER 50+ PO) Take 1 tablet by mouth daily.     Multiple Vitamins-Minerals (PRESERVISION AREDS 2) CAPS Take 1 capsule by mouth 2 (two) times daily.     Potassium Chloride ER 20 MEQ TBCR Take 1 tablet by mouth daily.     rosuvastatin (CRESTOR) 5 MG tablet Take 5 mg by mouth every other day.      umeclidinium-vilanterol  (ANORO ELLIPTA) 62.5-25 MCG/INH AEPB Inhale 1 puff into the lungs daily.     spironolactone (ALDACTONE) 25 MG tablet Take 1 tablet (25 mg total) by mouth daily. 45 tablet 3   torsemide (DEMADEX) 20 MG tablet Take 4 tablets (80 mg total) by mouth every morning AND 3 tablets (60 mg total) every evening. 200 tablet 3   No current facility-administered medications for this encounter.   Wt Readings from Last 3 Encounters:  01/19/23 99 kg (218 lb 3.2 oz)  11/22/22 102.1 kg (225 lb)  11/01/22 101.3 kg (223 lb 6.4 oz)   General: NAD Neck: JVP 12-13 cm, no thyromegaly or thyroid nodule.  Lungs: Clear to auscultation bilaterally with normal respiratory effort. CV: Nondisplaced PMI.  Heart regular S1/S2, no S3/S4, 3/6 HSM LLSB.  2+ ankle edema.  No carotid bruit.  Difficult to palpate pedal pulses.  Abdomen: Soft, nontender, no hepatosplenomegaly, no distention.  Skin: Intact without lesions or rashes.  Neurologic: Alert and oriented x 3.  Psych: Normal affect. Extremities: No clubbing or cyanosis.  HEENT: Normal.   Assessment/Plan: 1.Chronic systolic CHF: Ischemic cardiomyopathy.  St Jude CRT-D device, now s/p AV nodal ablation.  Echo in 3/21 showed EF 20% with diffuse hypokinesis, mildly dilated RV with mildly decreased systolic function, PASP 49 mmHg, dilated IVC.  Echo 12/22 showed EF 20-25%, mildly decreased RV function.  TEE in 10/23 with EF 30%, PASP 60 mmHg, mild MR, moderate-severe TR, small TV vegetation.  Echo 3/24 showed  EF 25-30%, mild LV dilation, mild RV dysfunction, mild MR, moderate TR. NYHA class III symptoms chronically.  98% BiV pacing by device interrogation today. She is volume overloaded on exam, Corvue recently showed decreased thoracic impedance.  - Increase torsemide to 80 qam/60 qpm. BMET/BNP  today and BMET in 10 days.  - Increase spironolactone to 25 mg daily.  - Continue BiDil 0.5 tab tid (unable to tolerate Bidil 1 tab tid). - Continue Toprol XL 50 mg bid.    - Off  empagliflozin due to yeast infection and UTI.  2. CAD: S/p CABG in 2015. No chest pain. - No ASA given apixaban use.  - Continue Crestor 5 qod + Zetia. 3. Atrial fibrillation: Chronic now, looks like it has gone on since 2018.  Unable to cardiovert her on amiodarone so it was stopped. She is now s/p AVN ablation to allow more BiV pacing. Rate controlled.   - Continue apixaban 2.5 mg bid.  4. CKD Stage 4: Most recent creatinine 1.98.  - BMET today.  5. 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  Also with T8/9 discitis/osteomyelitis.  - She remains on suppressive amoxicillin per ID.   Follow up 2 months with APP.  Signed, Marca Ancona, MD  01/20/2023  [facility] [place] [address] [facility] [place] [address] [phone] (office) [phone] (fax)

## 2023-01-29 ENCOUNTER — Other Ambulatory Visit (HOSPITAL_COMMUNITY): Payer: Self-pay | Admitting: Adult Health

## 2023-02-02 ENCOUNTER — Ambulatory Visit (HOSPITAL_COMMUNITY)
Admission: RE | Admit: 2023-02-02 | Discharge: 2023-02-02 | Disposition: A | Discharge status: Home / Self Care | Payer: 59 | Source: Ambulatory Visit | Attending: Cardiology | Admitting: Cardiology

## 2023-02-02 DIAGNOSIS — I5042 Chronic combined systolic (congestive) and diastolic (congestive) heart failure: Secondary | ICD-10-CM | POA: Diagnosis present

## 2023-02-02 LAB — BASIC METABOLIC PANEL
Anion gap: 12 (ref 5–15)
BUN: 45 mg/dL — ABNORMAL HIGH (ref 8–23)
CO2: 23 mmol/L (ref 22–32)
Calcium: 9.9 mg/dL (ref 8.9–10.3)
Chloride: 100 mmol/L (ref 98–111)
Creatinine, Ser: 2.09 mg/dL — ABNORMAL HIGH (ref 0.44–1.00)
GFR, Estimated: 23 mL/min — ABNORMAL LOW (ref >60)
Glucose, Bld: 123 mg/dL — ABNORMAL HIGH (ref 70–99)
Potassium: 4 mmol/L (ref 3.5–5.1)
Sodium: 135 mmol/L (ref 135–145)

## 2023-02-13 ENCOUNTER — Other Ambulatory Visit (HOSPITAL_COMMUNITY): Payer: Self-pay | Admitting: Cardiology

## 2023-02-13 MED ORDER — SPIRONOLACTONE 25 MG PO TABS: 25.0000 mg | ORAL_TABLET | Freq: Every day | ORAL | 3 refills | Status: DC

## 2023-02-19 ENCOUNTER — Ambulatory Visit: Payer: 59 | Attending: Cardiology

## 2023-02-19 DIAGNOSIS — Z95810 Presence of automatic (implantable) cardiac defibrillator: Secondary | ICD-10-CM

## 2023-02-19 DIAGNOSIS — I5042 Chronic combined systolic (congestive) and diastolic (congestive) heart failure: Secondary | ICD-10-CM | POA: Diagnosis not present

## 2023-02-20 NOTE — Progress Notes (Signed)
EPIC Encounter for ICM Monitoring  Patient Name: Elaine [name] is a 82 y.o. female Date: 02/20/2023 Primary Care Physican: Cleatis Polka., MD Primary Cardiologist: Harding/McLean Electrophysiologist: Kathreen Cornfield Pacing: 98% 04/26/2022 Weight: 228 lbs 11/01/2022 Office Weight: 223 lbs 02/20/2023 Weight: 218 lbs                                                            Spoke with patient and heart failure questions reviewed.  Transmission results reviewed.  Pt reports feet/legs have been swelling.     CorVue thoracic impedance suggesting possible fluid accumulation starting 6/21.    Prescribed:   Torsemide 20 mg take 4 tablets (80 mg total) by mouth every morning and 3 tablets (60 mg total) every evening.   Potassium 20 mEq take 1 tablet(s) (20 mEq total) by mouth daily Spironolactone 25 mg take 0.5 tablet (12.5 mg total) daily   Labs: 02/02/2023 Creatinine 2.09, BUN 45, Potassium 4.0, Sodium 135, GFR 23 01/19/2023 Creatinine 2.09, BUN 57, Potassium 4.1, Sodium 136, GFR 23 11/22/2022 Creatinine 1.98, BUN 39, Potassium 4.7, Sodium 135, GFR 25  11/15/2022 Creatinine 2.11, BUN 43, Potassium 4.4, Sodium 133, GFR 23  A complete set of results can be found in Results Review.   Recommendations:  She did not realize that she should be taking Torsemide 80 mg AM/60 mg PM.  She has been taking Torsemide 80 mg/40mg .  Advised that dosage was changed at 5/24 OV with Dr Shirlee Latch and advised to take the prescribed dosage of Torsemide 80 mg AM and 60 mg PM.       Follow-up plan: ICM clinic phone appointment on 02/27/2023 to recheck fluid levels.   91 day device clinic remote transmission 03/07/2023.     EP/Cardiology Office Visits:  03/21/2023 with Dr Shirlee Latch.  02/21/2023 with Dr Herbie Baltimore.  Left message to contact EP scheduler to schedule yearly OV with Dr Elberta Fortis.   Recall 07/02/2022 with Dr Elberta Fortis.   Copy of ICM check sent to Dr. Elberta Fortis.   3 month ICM trend: 02/18/2023.    12-14 Month ICM trend:      Karie Soda, RN 02/20/2023 3:59 PM

## 2023-02-21 ENCOUNTER — Ambulatory Visit: Payer: 59 | Attending: Cardiology | Admitting: Cardiology

## 2023-02-21 ENCOUNTER — Encounter: Payer: Self-pay | Admitting: Cardiology

## 2023-02-27 ENCOUNTER — Ambulatory Visit: Payer: 59 | Attending: Cardiology

## 2023-02-27 DIAGNOSIS — Z95810 Presence of automatic (implantable) cardiac defibrillator: Secondary | ICD-10-CM

## 2023-02-27 DIAGNOSIS — I5042 Chronic combined systolic (congestive) and diastolic (congestive) heart failure: Secondary | ICD-10-CM

## 2023-02-28 NOTE — Progress Notes (Signed)
EPIC Encounter for ICM Monitoring  Patient Name: Elaine [name] is a 82 y.o. female Date: 02/28/2023 Primary Care Physican: Cleatis Polka., MD Primary Cardiologist: Harding/McLean Electrophysiologist: Kathreen Cornfield Pacing: 98% 04/26/2022 Weight: 228 lbs 11/01/2022 Office Weight: 223 lbs 02/20/2023 Weight: 218 lbs                                                            Spoke with patient and heart failure questions reviewed.  Transmission results reviewed.  Pt reports she gets cramping and wonders if it is related to taking Potassium tablets.    CorVue thoracic impedance suggesting fluid levels returned to normal.    Prescribed:   Torsemide 20 mg take 4 tablets (80 mg total) by mouth every morning and 3 tablets (60 mg total) every evening.   Potassium 20 mEq take 1 tablet(s) (20 mEq total) by mouth daily Spironolactone 25 mg take 0.5 tablet (12.5 mg total) daily   Labs: 02/02/2023 Creatinine 2.09, BUN 45, Potassium 4.0, Sodium 135, GFR 23 01/19/2023 Creatinine 2.09, BUN 57, Potassium 4.1, Sodium 136, GFR 23 11/22/2022 Creatinine 1.98, BUN 39, Potassium 4.7, Sodium 135, GFR 25  11/15/2022 Creatinine 2.11, BUN 43, Potassium 4.4, Sodium 133, GFR 23  A complete set of results can be found in Results Review.   Recommendations:   No changes and encouraged to call if experiencing any fluid symptoms.       Follow-up plan: ICM clinic phone appointment on 04/04/2023.   91 day device clinic remote transmission 04/03/2023.     EP/Cardiology Office Visits:  03/21/2023 with Dr Shirlee Latch.   Left message to contact EP scheduler to schedule yearly OV with Dr Elberta Fortis.   Recall 07/02/2022 with Dr Elberta Fortis.   Copy of ICM check sent to Dr. Elberta Fortis.   3 month ICM trend: 02/26/2023.    12-14 Month ICM trend:     Karie Soda, RN 02/28/2023 3:53 PM

## 2023-03-07 ENCOUNTER — Ambulatory Visit (INDEPENDENT_AMBULATORY_CARE_PROVIDER_SITE_OTHER): Payer: 59

## 2023-03-07 DIAGNOSIS — I255 Ischemic cardiomyopathy: Secondary | ICD-10-CM

## 2023-03-07 LAB — CUP PACEART REMOTE DEVICE CHECK
Battery Remaining Longevity: 52 mo
Battery Remaining Percentage: 55 %
Battery Voltage: 2.96 V
Date Time Interrogation Session: 20240709220855
HighPow Impedance: 56 Ohm
Implantable Lead Connection Status: 753985
Implantable Lead Connection Status: 753985
Implantable Lead Implant Date: 20201130
Implantable Lead Implant Date: 20201130
Implantable Lead Location: 753858
Implantable Lead Location: 753860
Implantable Pulse Generator Implant Date: 20201130
Lead Channel Impedance Value: 430 Ohm
Lead Channel Impedance Value: 510 Ohm
Lead Channel Pacing Threshold Amplitude: 0.625 V
Lead Channel Pacing Threshold Amplitude: 0.75 V
Lead Channel Pacing Threshold Pulse Width: 0.4 ms
Lead Channel Pacing Threshold Pulse Width: 0.5 ms
Lead Channel Sensing Intrinsic Amplitude: 12 mV
Lead Channel Setting Pacing Amplitude: 1.125
Lead Channel Setting Pacing Amplitude: 2.5 V
Lead Channel Setting Pacing Pulse Width: 0.5 ms
Lead Channel Setting Pacing Pulse Width: 0.5 ms
Lead Channel Setting Sensing Sensitivity: 0.5 mV
Pulse Gen Serial Number: 810000193
Zone Setting Status: 755011

## 2023-03-11 ENCOUNTER — Other Ambulatory Visit (HOSPITAL_COMMUNITY): Payer: Self-pay | Admitting: Cardiology

## 2023-03-12 ENCOUNTER — Other Ambulatory Visit (HOSPITAL_COMMUNITY): Payer: Self-pay | Admitting: Adult Health

## 2023-03-18 ENCOUNTER — Other Ambulatory Visit (HOSPITAL_COMMUNITY): Payer: Self-pay | Admitting: Cardiology

## 2023-03-20 NOTE — Progress Notes (Signed)
ID:  Elaine [name], DOB 01/07/1941, MRN [id number]  Provider location: [facility] Type of Visit:  Heart Failure Follow up  PCP:  Martha Clan, MD  Primary Cardiologist:  Bryan Lemma, MD EP: Dr Linde Gillis HF Cardiologist: Dr. Shirlee Latch   HPI: Elaine [name] is a 82 y.o. with history of CAD s/p CABG, COPD, CKD stage 3, and chronic systolic systolic CHF/ischemic cardiomyopathy.  She was referred by Dr. Herbie Baltimore for evaluation of CHF.  Patient was admitted in 2/15 with atrial fibrillation/RVR and chest pain.  Cath showed severe 3VD and she had CABG x 3.  Echo in 2/15 showed EF down to 20-25%. In 2018, it appears that she went back into atrial fibrillation and has remained in atrial fibrillation persistently.  DCCV was not attempted.   Most recent echo in 10/20 showed EF < 20% with normal RV.  She had a St Jude CRT-D device implanted in 11/20.  She says that she feels "90%" better with CRT.  She is followed by Dr. Signe Colt with nephrology.   She has been in atrial fibrillation persistently, I attempted DCCV while on amiodarone but she did not convert.  Therefore, I stopped amiodarone.    Echo in 3/21 showed EF 20% with diffuse hypokinesis, mildly dilated RV with mildly decreased systolic function, PASP 49 mmHg, dilated IVC.  In 9/21, she had AV nodal ablation.    She stopped empagliflozin due to UTIs and yeast infections.   Echo 12/22 EF 20-25%, mildly decreased RV function.   She was admitted in 10/23 with E faecalis UTI.  She was found to have T8/9 discitis/osteomyelitis.  TEE showed small TV vegetation that did not appear to involve her device.  Other findings were EF 30%, PASP 60 mmHg, mild MR, moderate-severe TR.  EP thought she was too high risk for device extraction.  She was treated with 6 wks of IV ampicillin and ceftriaxone.  She is now on amoxicillin for suppression.   Echo 3/24 EF 25-30%, mild LV dilation, mild RV dysfunction, mild MR, moderate TR, IVC dilated. .    Today she returns for HF follow up. Weight down 7 lbs.  She uses a walker at home, here in wheelchair.  She is usually ok walking around her house.  Goes out to [employer] Gets short of breath and tired if she tries to walk longer distances.  No chest pain.  No lightheadedness.  No orthopnea/PND.    St Jude device interrogation: 98% BiV pacing, stable thoracic impedance today be recently decreased  ECG (personally reviewed) Atrial fibrillation with BiV pacing.   Labs (2/21): K 4.5, creatinine 2.08 => 2.99, hgb 12.8 Labs (7/21): K 4, creatinine 2.3, TSH normal Labs (12/21): K 4, creatinine 2.31, BNP 671 Labs (1/22): K 4.1, creatinine 2.48  Labs (3/22): LDL 77 Labs (6/22): K 4.9, creatinine 2.26 Labs (10/22): K 4.8, creatinine 2.41 Labs (12/22): LDL 62, hgb 13.5 Labs (2/23): K 4.4, creatinine 2.28 Labs (11/11/21): K 5.8  Labs (3/23): BUN 52 creatinine 2.5 K 4.7  Labs (5/23): K 4.2, creatinine 2.17  Labs (8/23): K 3.7, creatinine 2.11, hgb 12.5 Labs (12/23): K 4, creatinine 2.14, hgb 10.4 Labs (1/24): K 3.2, creatinine 2.35 Labs (3/24): K 4.4, creatinine 2.11 => 1.98, BNP 302, hgb 12.2  PMH: 1. CAD: s/p CABG in 2/15 with LIMA-LAD, SVG-OM2, SVG-PLV.  2. LBBB: Chronic.  3. Atrial fibrillation: Persistent since 2018.  - LA appendage clip with CABG.  4. H/o aspiration PNA 5.  Type 2 diabetes 6. COPD 7. CKD stage 4 8. Chronic systolic CHF: Ischemic cardiomyopathy.  She has a St Jude BiV ICD.  - Echo (2/15): EF 20-25%.  - Echo (5/15): EF 40-45% - Echo (10/20): EF <20%, mild LVH, normal RV size and systolic function.  - Echo (3/21): EF 20% with diffuse hypokinesis, mildly dilated RV with mildly decreased systolic function, PASP 49 mmHg, dilated IVC.  - AV nodal ablation - Echo (12/22): EF 20-25%, mildly decreased RV function. - TEE (10/23): Small TV vegetation that did not appear to involve her device.  Other findings were EF 30%, PASP 60 mmHg, mild MR, moderate-severe TR - Echo  (3/24): EF 25-30%, mild LV dilation, mild RV dysfunction, mild MR, moderate TR, IVC dilated.  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 She also had T8/T9 discitis/osteomyelitis.   Social History   Socioeconomic History   Marital status: Widowed    Spouse name: Not on file   Number of children: Not on file   Years of education: Not on file   Highest education level: Not on file  Occupational History   Occupation: retired  Tobacco Use   Smoking status: Former    Current packs/day: 0.00    Average packs/day: 0.5 packs/day for 39.0 years (19.5 ttl pk-yrs)    Types: Cigarettes    Start date: 08/28/1954    Quit date: 08/28/1993    Years since quitting: 29.5   Smokeless tobacco: Never  Vaping Use   Vaping status: Never Used  Substance and Sexual Activity   Alcohol use: No    Comment: quit 95   Drug use: No   Sexual activity: Not on file  Other Topics Concern   Not on file  Social History Narrative   Lives with husband and son in [place].  She is currently retired.   Former smoker quit in 1995.   Social Determinants of Health   Financial Resource Strain: Medium Risk (11/25/2019)   Overall Financial Resource Strain (CARDIA)    Difficulty of Paying Living Expenses: Somewhat hard  Food Insecurity: No Food Insecurity (11/25/2019)   Hunger Vital Sign    Worried About Running Out of Food in the Last Year: Never true    Ran Out of Food in the Last Year: Never true  Transportation Needs: No Transportation Needs (11/25/2019)   PRAPARE - Administrator, Civil Service (Medical): No    Lack of Transportation (Non-Medical): No  Physical Activity: Not on file  Stress: Not on file  Social Connections: Not on file  Intimate Partner Violence: Not on file   Family History  Adopted: Yes  Problem Relation Age of Onset   Other Mother    Hypertension Mother    Colon cancer Neg Hx    Esophageal cancer Neg Hx    Rectal cancer Neg Hx    Stomach cancer Neg Hx     Current Outpatient Medications  Medication Sig Dispense Refill   acetaminophen (TYLENOL) 650 MG CR tablet Take 1,300 mg by mouth 2 (two) times daily.     albuterol (VENTOLIN HFA) 108 (90 Base) MCG/ACT inhaler Inhale 2 puffs into the lungs every 6 (six) hours as needed for wheezing or shortness of breath.     amoxicillin (AMOXIL) 500 MG capsule Take 2 capsules (1,000 mg total) by mouth 2 (two) times daily. 120 capsule 11   apixaban (ELIQUIS) 2.5 MG TABS tablet Take 2.5 mg by mouth 2 (two) times daily.  ciprofloxacin (CIPRO) 250 MG tablet Take 250 mg by mouth 2 (two) times daily.     Coenzyme Q10 (COQ-10) 100 MG CAPS Take 100 mg by mouth in the morning and at bedtime.      colchicine 0.6 MG tablet Take 0.6 mg by mouth daily as needed (Gout).      Cranberry 250 MG TABS Take 500 mg by mouth 2 (two) times daily.     Dulaglutide (TRULICITY ) Inject 1.5 mg into the skin once a week. Tuesday     ELDERBERRY PO Take 1 tablet by mouth 2 (two) times daily.     ezetimibe (ZETIA) 10 MG tablet Take 10 mg by mouth daily.     ferrous sulfate 325 (65 FE) MG tablet Take 325 mg by mouth every other day.     isosorbide-hydrALAZINE (BIDIL) 20-37.5 MG tablet TAKE 1/2 (ONE-HALF) TABLET BY MOUTH THREE TIMES DAILY 135 tablet 0   levothyroxine (SYNTHROID) 112 MCG tablet Take 1 tablet (112 mcg total) by mouth daily before breakfast. 30 tablet 12   metoprolol succinate (TOPROL-XL) 50 MG 24 hr tablet Take 1 tablet (50 mg total) by mouth 2 (two) times daily. Take with or immediately following a meal. 60 tablet 0   Misc. Devices (ROLLATOR ULTRA-LIGHT) MISC UAD to ambulate DX53.81 1 each 0   Multiple Vitamins-Minerals (MULTI FOR HER 50+ PO) Take 1 tablet by mouth daily.     Multiple Vitamins-Minerals (PRESERVISION AREDS 2) CAPS Take 1 capsule by mouth 2 (two) times daily.     Potassium Chloride ER 20 MEQ TBCR Take 1 tablet by mouth daily.     rosuvastatin (CRESTOR) 5 MG tablet Take 5 mg by mouth every other day.       spironolactone (ALDACTONE) 25 MG tablet Take 1 tablet (25 mg total) by mouth daily. 90 tablet 3   torsemide (DEMADEX) 20 MG tablet TAKE 4 TABLETS (80 MG TOTAL) BY MOUTH IN THE MORNING AND 2 TABLETS (40MG  TOTAL) IN THE EVENING 200 tablet 0   umeclidinium-vilanterol (ANORO ELLIPTA) 62.5-25 MCG/INH AEPB Inhale 1 puff into the lungs daily.     No current facility-administered medications for this visit.   Wt Readings from Last 3 Encounters:  01/19/23 99 kg (218 lb 3.2 oz)  11/22/22 102.1 kg (225 lb)  11/01/22 101.3 kg (223 lb 6.4 oz)   General: NAD Neck: JVP 12-13 cm, no thyromegaly or thyroid nodule.  Lungs: Clear to auscultation bilaterally with normal respiratory effort. CV: Nondisplaced PMI.  Heart regular S1/S2, no S3/S4, 3/6 HSM LLSB.  2+ ankle edema.  No carotid bruit.  Difficult to palpate pedal pulses.  Abdomen: Soft, nontender, no hepatosplenomegaly, no distention.  Skin: Intact without lesions or rashes.  Neurologic: Alert and oriented x 3.  Psych: Normal affect. Extremities: No clubbing or cyanosis.  HEENT: Normal.   Assessment/Plan: 1.Chronic systolic CHF: Ischemic cardiomyopathy.  St Jude CRT-D device, now s/p AV nodal ablation.  Echo in 3/21 showed EF 20% with diffuse hypokinesis, mildly dilated RV with mildly decreased systolic function, PASP 49 mmHg, dilated IVC.  Echo 12/22 showed EF 20-25%, mildly decreased RV function.  TEE in 10/23 with EF 30%, PASP 60 mmHg, mild MR, moderate-severe TR, small TV vegetation.  Echo 3/24 showed  EF 25-30%, mild LV dilation, mild RV dysfunction, mild MR, moderate TR. NYHA class III symptoms chronically.  98% BiV pacing by device interrogation today. She is volume overloaded on exam, Corvue recently showed decreased thoracic impedance.  - Increase torsemide to 80 qam/60  qpm. BMET/BNP today and BMET in 10 days.  - Increase spironolactone to 25 mg daily.  - Continue BiDil 0.5 tab tid (unable to tolerate Bidil 1 tab tid). - Continue Toprol XL 50  mg bid.    - Off empagliflozin due to yeast infection and UTI.  2. CAD: S/p CABG in 2015. No chest pain. - No ASA given apixaban use.  - Continue Crestor 5 qod + Zetia. 3. Atrial fibrillation: Chronic now, looks like it has gone on since 2018.  Unable to cardiovert her on amiodarone so it was stopped. She is now s/p AVN ablation to allow more BiV pacing. Rate controlled.   - Continue apixaban 2.5 mg bid.  4. CKD Stage 4: Most recent creatinine 1.98.  - BMET today.  5. 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  Also with T8/9 discitis/osteomyelitis.  - She remains on suppressive amoxicillin per ID.   Follow up 2 months with APP.  Signed, Jacklynn Ganong, FNP  03/20/2023  [facility] [address] [facility] [place] [address] [phone] (office) [phone] (fax)

## 2023-03-21 ENCOUNTER — Encounter (HOSPITAL_COMMUNITY): Payer: Self-pay

## 2023-03-21 ENCOUNTER — Ambulatory Visit (HOSPITAL_COMMUNITY)
Admission: RE | Admit: 2023-03-21 | Discharge: 2023-03-21 | Disposition: A | Discharge status: Home / Self Care | Payer: 59 | Source: Ambulatory Visit | Attending: Family Medicine | Admitting: Family Medicine

## 2023-03-21 VITALS — BP 118/62 | HR 73 | Wt 219.0 lb

## 2023-03-21 DIAGNOSIS — Z95810 Presence of automatic (implantable) cardiac defibrillator: Secondary | ICD-10-CM | POA: Diagnosis not present

## 2023-03-21 DIAGNOSIS — Z7901 Long term (current) use of anticoagulants: Secondary | ICD-10-CM | POA: Insufficient documentation

## 2023-03-21 DIAGNOSIS — I2510 Atherosclerotic heart disease of native coronary artery without angina pectoris: Secondary | ICD-10-CM | POA: Diagnosis not present

## 2023-03-21 DIAGNOSIS — Z951 Presence of aortocoronary bypass graft: Secondary | ICD-10-CM | POA: Diagnosis not present

## 2023-03-21 DIAGNOSIS — N390 Urinary tract infection, site not specified: Secondary | ICD-10-CM | POA: Insufficient documentation

## 2023-03-21 DIAGNOSIS — Z79890 Hormone replacement therapy (postmenopausal): Secondary | ICD-10-CM | POA: Insufficient documentation

## 2023-03-21 DIAGNOSIS — I255 Ischemic cardiomyopathy: Secondary | ICD-10-CM | POA: Insufficient documentation

## 2023-03-21 DIAGNOSIS — Z5986 Financial insecurity: Secondary | ICD-10-CM | POA: Insufficient documentation

## 2023-03-21 DIAGNOSIS — B952 Enterococcus as the cause of diseases classified elsewhere: Secondary | ICD-10-CM | POA: Diagnosis not present

## 2023-03-21 DIAGNOSIS — N184 Chronic kidney disease, stage 4 (severe): Secondary | ICD-10-CM | POA: Insufficient documentation

## 2023-03-21 DIAGNOSIS — Z79899 Other long term (current) drug therapy: Secondary | ICD-10-CM | POA: Insufficient documentation

## 2023-03-21 DIAGNOSIS — I4819 Other persistent atrial fibrillation: Secondary | ICD-10-CM | POA: Insufficient documentation

## 2023-03-21 DIAGNOSIS — I5022 Chronic systolic (congestive) heart failure: Secondary | ICD-10-CM | POA: Diagnosis not present

## 2023-03-21 DIAGNOSIS — I079 Rheumatic tricuspid valve disease, unspecified: Secondary | ICD-10-CM | POA: Insufficient documentation

## 2023-03-21 DIAGNOSIS — J449 Chronic obstructive pulmonary disease, unspecified: Secondary | ICD-10-CM | POA: Insufficient documentation

## 2023-03-21 DIAGNOSIS — Z87891 Personal history of nicotine dependence: Secondary | ICD-10-CM | POA: Diagnosis not present

## 2023-03-21 DIAGNOSIS — Z7985 Long-term (current) use of injectable non-insulin antidiabetic drugs: Secondary | ICD-10-CM | POA: Insufficient documentation

## 2023-03-21 DIAGNOSIS — E1122 Type 2 diabetes mellitus with diabetic chronic kidney disease: Secondary | ICD-10-CM | POA: Insufficient documentation

## 2023-03-21 DIAGNOSIS — I4821 Permanent atrial fibrillation: Secondary | ICD-10-CM | POA: Diagnosis not present

## 2023-03-21 NOTE — Patient Instructions (Signed)
  Referrals:  YOU HAVE BEEN REFERRED TO WOUND CARE THEY WILL REACH OUT TO YOU OR CALL TO ARRANGE THIS. PLEASE CALL us WITH ANY CONCERNS    Follow-Up in: 3-4 MONTHS WITH DR. Shirlee Latch PLEASE CALL OUR OFFICE AROUND MID SEPTEMBER TO GET SCHEDULED FOR YOUR APPOINTMENT. PHONE NUMBER IS [phone] OPTION 2    At the [facility], you and your health needs are our priority. We have a designated team specialized in the treatment of Heart Failure. This Care Team includes your primary Heart Failure Specialized Cardiologist (physician), Advanced Practice Providers (APPs- Physician Assistants and Nurse Practitioners), and Pharmacist who all work together to provide you with the care you need, when you need it.   You may see any of the following providers on your designated Care Team at your next follow up:  Dr. Arvilla Meres Dr. Marca Ancona Dr. Marcos Eke, NP Robbie Lis, [place] [facility], [place] Brynda Peon, NP Karle Plumber, PharmD   Please be sure to bring in all your medications bottles to every appointment.   Need to Contact us:  If you have any questions or concerns before your next appointment please send [place] a message through [place] or call our office at [phone].    TO LEAVE A MESSAGE FOR THE NURSE SELECT OPTION 2, PLEASE LEAVE A MESSAGE INCLUDING: YOUR NAME DATE OF BIRTH CALL BACK NUMBER REASON FOR CALL**this is important as we prioritize the call backs  YOU WILL RECEIVE A CALL BACK THE SAME DAY AS LONG AS YOU CALL BEFORE 4:00 PM

## 2023-03-28 NOTE — Progress Notes (Signed)
Remote ICD transmission.   

## 2023-04-02 ENCOUNTER — Other Ambulatory Visit (HOSPITAL_COMMUNITY): Payer: Self-pay | Admitting: Cardiology

## 2023-04-02 NOTE — Progress Notes (Deleted)
Cardiology Office Note:  .   Date:  04/02/2023  ID:  Era Skeen, DOB 10/10/1940, MRN [id number] PCP: Cleatis Polka., MD  [facility] Providers Cardiologist:  Bryan Lemma, MD Electrophysiologist:  Will Jorja Loa, MD  Advanced Heart Failure:  Marca Ancona, MD     History of Present Illness: Elaine [name]   Elaine [name] is a 82 y.o. female with a past medical history of CAD s/p CABG, COPD, CKD stage III, chronic systolic heart failure/ischemic cardiomyopathy, atrial fibrillation. Patient is followed by Dr. Herbie Baltimore and the advanced heart failure clinic. Presents today for a 6 month follow up appointment   Per chart review, patient was admitted in 09/2013 with atrial fibrillation with RVR and chest pain. That admission, echocardiogram 10/20/13 showed EF 20-25% with regional wall motion abnormalities, mild LVH, mild mitral regurgitation. Underwent cardiac catheterization that showed severe 3 vessel coronary artery disease. Ultimately underwent CABG x3. Later in 2018, patient was again found to have atrial fibrillation. DCCV was not attempted and patient remained in persistent atrial fibrillation. Echocardiogram in 05/2019 showed EF <20%, mildly increased LVH, normal RV function. Had a St Jude CRT-D device implanted in 06/2019. She was started on amiodarone and attempted DCCV, but failed to convert. Amiodarone was later discontinued. She Underwent AV Nodal Ablation with Dr. Elberta Fortis in 2021. Echocardiogram in 07/2021 showed EF 20-25%, mildly decreased RV function.   Patient was later admitted in 05/2022 with UTI and was found to have T8/9 discitis/osteomyelitis. Echocardiogram on 06/03/22 showed EF 25-30%, moderately reduced RV function, mild mitral valve regurgitation, moderate-severe tricuspid valve regurgitation. Underwent TEE on 06/06/22 that showed a small TV vegetation, but there were no vegetations on her device. EP thought that patient was too high risk to undergo device extraction and she  was treated with 6 weeks of IV antibiotics.   Most recent echocardiogram from 10/31/21 showed EF 25-30% with regional wall motion abnormalities (stable compared to previous echo), mildly reduced RV function, mild mitral regurgitation, moderate tricuspid valve regurgitation.   Patient was recently seen in the [facility] clinic on 03/21/23. At that time, patient overall felt fine. She had some LE swelling when she had increased her juice intake, but her swelling improved. St. Jude device interrogation showed 98% BiV pacing, stable CorVue, no VT. Patient was unable to apply compression stockings, and unna boots were placed. She remained on torsemide 80 mg every AM and 60 mg every PM, spironolactone 25 mg daily, BiDil 0.5 mg TID, toprol XL 50 mg daily, crestor, zetia, and eliquis.   Chronic Systolic Heart Failure  Ischemic Cardiomyopathy  - Patient's most recent echocardiogram from 10/2022 showed EF 25-30%, mildly reduced RV function  - Currently on torsemide 80 mg every AM and 60 mg every PM -  - Continue BiDil 0.5 tab TID, spironolactone 25 mg daily, toprol XL 50 mg BID. Cannot tolerate SGTL2i due to yeast infections and UTIs   S/p ICD  - Previously had a St. Jude ICD implanted in 2020. Most recent device check from 03/06/23 showed normal device function, no VT - Followed by Dr. Elberta Fortis  - Continue remote device checks every 3 months   CAD s/p CABG  - Previously underwent CABG in 2015  - Denies anginal symptoms  - Not on ASA due to eliquis use  - Continue crestor 5 mg every other day and zetia. Continue metoprolol succinate 50 mg BID   Permanent Atrial Fibrillation  - Patient underwent AV nodal ablation in 2021  to allow more BiV pacing  - HR well controlled  - Continue eliquis 2.5 mg BID (dose adjusted for age, renal function)   CKD stage 4  - Followed by nephrology  - Labs from 01/2023 showed creatinine 2.09 ***  Tricuspid Valve Endocarditis  Tricuspid regurgitation  - TEE in  05/2022 showed a small vegetation on the tricuspid valve. No vegetations on the device  - Patient was treated with IV antibiotics and now remains on suppressive amoxicillin per ID   HLD  - Lipid panel from 04/2022 showed LDL 67 - Continue crestor 5 mg every other day and zetia. Patient previously had muscle pains on higher doses   ROS: ***  Studies Reviewed: .        *** Risk Assessment/Calculations:   {Does this patient have ATRIAL FIBRILLATION?[phone]} No BP recorded.  {Refresh Note OR Click here to enter BP  :1}***       Physical Exam:   VS:  There were no vitals taken for this visit.   Wt Readings from Last 3 Encounters:  03/21/23 219 lb (99.3 kg)  01/19/23 218 lb 3.2 oz (99 kg)  11/22/22 225 lb (102.1 kg)    GEN: Well nourished, well developed in no acute distress NECK: No JVD; No carotid bruits CARDIAC: ***RRR, no murmurs, rubs, gallops RESPIRATORY:  Clear to auscultation without rales, wheezing or rhonchi  ABDOMEN: Soft, non-tender, non-distended EXTREMITIES:  No edema; No deformity   ASSESSMENT AND PLAN: .   ***    {Are you ordering a CV Procedure (e.g. stress test, cath, DCCV, TEE, etc)?   Press F2        :[id number]}  Dispo: ***  Signed, Jonita Albee, PA-C

## 2023-04-03 ENCOUNTER — Encounter: Payer: Self-pay | Admitting: Cardiology

## 2023-04-03 ENCOUNTER — Ambulatory Visit: Payer: 59

## 2023-04-04 ENCOUNTER — Ambulatory Visit: Payer: 59 | Attending: Cardiology

## 2023-04-04 ENCOUNTER — Ambulatory Visit: Payer: 59 | Admitting: Cardiology

## 2023-04-04 DIAGNOSIS — I5022 Chronic systolic (congestive) heart failure: Secondary | ICD-10-CM | POA: Diagnosis not present

## 2023-04-04 DIAGNOSIS — Z95810 Presence of automatic (implantable) cardiac defibrillator: Secondary | ICD-10-CM | POA: Diagnosis not present

## 2023-04-04 NOTE — Progress Notes (Signed)
EPIC Encounter for ICM Monitoring  Patient Name: Elaine [name] is a 82 y.o. female Date: 04/04/2023 Primary Care Physican: Cleatis Polka., MD Primary Cardiologist: [place] Electrophysiologist: Kathreen Cornfield Pacing: 98% 04/26/2022 Weight: 228 lbs 11/01/2022 Office Weight: 223 lbs 02/20/2023 Weight: 218 lbs                                                            Spoke with patient and heart failure questions reviewed.  Transmission results reviewed.  Pt asymptomatic for fluid accumulation. She has a cold and getting better.     CorVue thoracic impedance suggesting normal fluid levels.    Prescribed:   Torsemide 20 mg take 4 tablets (80 mg total) by mouth every morning and 2 tablets (40 mg total) every evening.   Spironolactone 25 mg take 0.5 tablet (12.5 mg total) daily   Labs: 03/14/2023 Creatinine 2.41, BUN 65, Potassium 4.8, Sodium 136, GFR 20 02/02/2023 Creatinine 2.09, BUN 45, Potassium 4.0, Sodium 135, GFR 23 01/19/2023 Creatinine 2.09, BUN 57, Potassium 4.1, Sodium 136, GFR 23 11/22/2022 Creatinine 1.98, BUN 39, Potassium 4.7, Sodium 135, GFR 25  11/15/2022 Creatinine 2.11, BUN 43, Potassium 4.4, Sodium 133, GFR 23  A complete set of results can be found in Results Review.   Recommendations:  No changes and encouraged to call if experiencing any fluid symptoms.   Follow-up plan: ICM clinic phone appointment on 05/07/2023.   91 day device clinic remote transmission 06/06/2023.     EP/Cardiology Office Visits: Recall 07/20/2023 with Dr Shirlee Latch.   Left message to contact EP scheduler to schedule yearly OV with Dr Elberta Fortis.   Recall 07/02/2022 with Dr Elberta Fortis.   Copy of ICM check sent to Dr. Elberta Fortis.   3 month ICM trend: 04/01/2023.    12-14 Month ICM trend:     Karie Soda, RN 04/04/2023 9:14 AM

## 2023-04-18 ENCOUNTER — Other Ambulatory Visit (HOSPITAL_COMMUNITY): Payer: Self-pay | Admitting: Cardiology

## 2023-04-20 ENCOUNTER — Other Ambulatory Visit (HOSPITAL_COMMUNITY): Payer: Self-pay | Admitting: Cardiology

## 2023-04-20 MED ORDER — METOPROLOL SUCCINATE ER 50 MG PO TB24: 50.0000 mg | ORAL_TABLET | Freq: Two times a day (BID) | ORAL | 2 refills | Status: DC

## 2023-04-26 ENCOUNTER — Other Ambulatory Visit (HOSPITAL_COMMUNITY): Payer: Self-pay | Admitting: Cardiology

## 2023-04-27 NOTE — Progress Notes (Deleted)
Cardiology Office Note:  .   Date:  04/27/2023  ID:  [name], DOB [birth year], MRN [id number] PCP: Cleatis Polka., MD  [facility] Providers Cardiologist:  Bryan Lemma, MD Electrophysiologist:  Will Jorja Loa, MD  Advanced Heart Failure:  Marca Ancona, MD     History of Present Illness: Elaine [name]   TAIANNA [name] is a 82 y.o. female with a past medical history of CAD s/p CABG, COPD, CKD stage III, chronic systolic heart failure/ischemic cardiomyopathy, atrial fibrillation. Patient is followed by Dr. Herbie Baltimore and the advanced heart failure clinic. Presents today for a 6 month follow up appointment   Per chart review, patient was admitted in 09/2013 with atrial fibrillation with RVR and chest pain. That admission, echocardiogram 10/20/13 showed EF 20-25% with regional wall motion abnormalities, mild LVH, mild mitral regurgitation. Underwent cardiac catheterization that showed severe 3 vessel coronary artery disease. Ultimately underwent CABG x3. Later in 2018, patient was again found to have atrial fibrillation. DCCV was not attempted and patient remained in persistent atrial fibrillation. Echocardiogram in 05/2019 showed EF <20%, mildly increased LVH, normal RV function. Had a St Jude CRT-D device implanted in 06/2019. She was started on amiodarone and attempted DCCV, but failed to convert. Amiodarone was later discontinued. She Underwent AV Nodal Ablation with Dr. Elberta Fortis in 2021. Echocardiogram in 07/2021 showed EF 20-25%, mildly decreased RV function.   Patient was later admitted in 05/2022 with UTI and was found to have T8/9 discitis/osteomyelitis. Echocardiogram on 06/03/22 showed EF 25-30%, moderately reduced RV function, mild mitral valve regurgitation, moderate-severe tricuspid valve regurgitation. Underwent TEE on 06/06/22 that showed a small TV vegetation, but there were no vegetations on her device. EP thought that patient was too high risk to undergo device extraction and she  was treated with 6 weeks of IV antibiotics.   Most recent echocardiogram from 10/31/21 showed EF 25-30% with regional wall motion abnormalities (stable compared to previous echo), mildly reduced RV function, mild mitral regurgitation, moderate tricuspid valve regurgitation.   Patient was recently seen in the Advanced Heart Failure clinic on 03/21/23. At that time, patient overall felt fine. She had some LE swelling when she had increased her juice intake, but her swelling improved. St. Jude device interrogation showed 98% BiV pacing, stable CorVue, no VT. Patient was unable to apply compression stockings, and unna boots were placed. She remained on torsemide 80 mg every AM and 60 mg every PM, spironolactone 25 mg daily, BiDil 0.5 mg TID, toprol XL 50 mg daily, crestor, zetia, and eliquis.   Chronic Systolic Heart Failure  Ischemic Cardiomyopathy  - Patient's most recent echocardiogram from 10/2022 showed EF 25-30%, mildly reduced RV function  - Currently on torsemide 80 mg every AM and 60 mg every PM -  - Continue BiDil 0.5 tab TID, spironolactone 25 mg daily, toprol XL 50 mg BID. Cannot tolerate SGTL2i due to yeast infections and UTIs   S/p ICD  - Previously had a St. Jude ICD implanted in 2020. Most recent device check from 03/06/23 showed normal device function, no VT - Followed by Dr. Elberta Fortis  - Continue remote device checks every 3 months   CAD s/p CABG  - Previously underwent CABG in 2015  - Denies anginal symptoms  - Not on ASA due to eliquis use  - Continue crestor 5 mg every other day and zetia. Continue metoprolol succinate 50 mg BID   Permanent Atrial Fibrillation  - Patient underwent AV nodal ablation in 2021  to allow more BiV pacing  - HR well controlled  - Continue eliquis 2.5 mg BID (dose adjusted for age, renal function)   CKD stage 4  - Followed by nephrology  - Labs from 01/2023 showed creatinine 2.09 ***  Tricuspid Valve Endocarditis  Tricuspid regurgitation  - TEE in  05/2022 showed a small vegetation on the tricuspid valve. No vegetations on the device  - Patient was treated with IV antibiotics and now remains on suppressive amoxicillin per ID   HLD  - Lipid panel from 04/2022 showed LDL 67 - Continue crestor 5 mg every other day and zetia. Patient previously had muscle pains on higher doses   ROS: ***  Studies Reviewed: .        *** Risk Assessment/Calculations:   {Does this patient have ATRIAL FIBRILLATION?[phone]} No BP recorded.  {Refresh Note OR Click here to enter BP  :1}***       Physical Exam:   VS:  There were no vitals taken for this visit.   Wt Readings from Last 3 Encounters:  03/21/23 219 lb (99.3 kg)  01/19/23 218 lb 3.2 oz (99 kg)  11/22/22 225 lb (102.1 kg)    GEN: Well nourished, well developed in no acute distress NECK: No JVD; No carotid bruits CARDIAC: ***RRR, no murmurs, rubs, gallops RESPIRATORY:  Clear to auscultation without rales, wheezing or rhonchi  ABDOMEN: Soft, non-tender, non-distended EXTREMITIES:  No edema; No deformity   ASSESSMENT AND PLAN: .   ***    {Are you ordering a CV Procedure (e.g. stress test, cath, DCCV, TEE, etc)?   Press F2        :[id number]}  Dispo: ***  Signed, Jonita Albee, PA-C

## 2023-05-07 ENCOUNTER — Ambulatory Visit: Payer: 59 | Attending: Cardiology

## 2023-05-07 DIAGNOSIS — Z95810 Presence of automatic (implantable) cardiac defibrillator: Secondary | ICD-10-CM

## 2023-05-07 DIAGNOSIS — I5022 Chronic systolic (congestive) heart failure: Secondary | ICD-10-CM

## 2023-05-08 ENCOUNTER — Telehealth (HOSPITAL_COMMUNITY): Payer: Self-pay

## 2023-05-08 NOTE — Telephone Encounter (Signed)
Received a fax requesting medical records from [facility]. Records were successfully faxed to: [phone] ,which was the number provided.. Medical request form will be scanned into patients chart(Continuation of Care).

## 2023-05-10 ENCOUNTER — Ambulatory Visit: Payer: 59 | Admitting: Cardiology

## 2023-05-10 NOTE — Progress Notes (Addendum)
Cardiology Clinic Note   Date: 05/11/2023 ID: JUDAH DEVOLL, DOB Jul 04, 1941, MRN [id number]  Primary Cardiologist:  Bryan Lemma, MD  Patient Profile    Elaine [name] is a 82 y.o. female who presents to the clinic today for routine follow up.     Past medical history significant for: CAD. R/LHC 10/20/2013 (ACS): Distal LM 70%.  Ostial LAD 70% followed by tandem 95% and 80% prior to SP1, 95% just after SP1 and D1 takeoff.  D1 with diffuse disease.  Proximal OM2 40%.  Mid RCA 60 to 70% followed by 80% focal lesion.  CTS consult.  Moderately reduced cardiac output with severely elevated LVEDP suggestive of likely acute systolic and diastolic heart failure. CABG x 3 and LAA clipping 10/23/2013: LIMA to LAD, SVG to OM, SVG to PLB. LHC 09/08/2014 (abnormal stress test): Severe native CAD with occluded LAD after 70 to 80% LM and diffuse RCA disease.  Widely patent grafts x 3. Chronic combined systolic and diastolic heart failure/ischemic cardiomyopathy. BiV ICD insertion 07/28/2019. Remote device check 03/06/2023: Normal device function.  Battery and lead parameters stable. Echo 11/01/2022: EF 25 to 30%.  All apical segments akinetic with dyskinesis of LV apex.  Mid to apical septal and mid to apical anterior walls akinetic.  Mid inferior and lateral walls hypokinetic.  Wall motion stable from prior echo.  Indeterminate diastolic parameters.  Abnormal global strain.  Mildly reduced RV function.  Mild RVH.  Moderately elevated PA pressure.  Moderate LAE.  Mild RAE.  Mild MR.  Moderate TR.  Aortic valve sclerosis without stenosis.  Dilated IVC, RA pressure 15 mmHg. Permanent A-fib. Onset February 2015. AV node ablation 05/19/2020. Endocarditis of tricuspid valve. TEE 06/06/2022: Small <1 cm vegetation on tricuspid valve.  Moderate to severe TR.  Trileaflet aortic valve with no vegetation.  Posterior mitral leaflet calcified and restricted, mild MR, no stenosis, no vegetation on mitral valve.  No vegetation  noted on pacer wires and RA/RV. Hypertension. Hyperlipidemia. COPD. T2DM. Hypothyroidism. CKD.     History of Present Illness    Elaine [name] is a longtime patient of cardiology.  She is followed by Dr. Herbie Baltimore in advanced heart failure clinic for the above outlined history.  In summary, patient was admitted in February 2015 for A-fib with RVR and chest pain.  Echo showed EF 20 to 25% with RWMA, mild LVH, mild MR.  She underwent LHC which showed severe three-vessel CAD and subsequently underwent CABG x 3.  In 2018 patient again was found to be in A-fib but DCCV was not attempted and patient remained in persistent A-fib.  Echo October 2020 showed EF <20%, mildly increased LVH, normal RV function.  She underwent ICD implantation November 2020.  She was started on amiodarone and had unsuccessful DCCV-amiodarone was later discontinued.  She underwent AV node ablation September 2021.  Patient was admitted October 2023 with UTI and was found to have T8-T9 discitis/osteomyelitis.  Echo showed EF 25 to 30%, moderately reduced RV function, mild MR, moderate to severe TR.  TEE showed small tricuspid valve vegetation but no vegetation on other valves or pacer wires.  Patient was felt to be too high of a risk to remove device.  She was treated with 6 weeks of IV antibiotic and has been maintained on suppressive amoxicillin per ID.  Most recent echo March 2024 with EF 25 to 30% (further details above).  Patient was last seen by advanced heart failure clinic on 03/21/2023 for follow-up.  She  reported an episode of increased lower extremity edema secondary to increased juice consumption but it improved when she cut back.  She was not volume overloaded at that time and it was felt chronic venous insufficiency was contributing to lower extremity edema.  Patient was placed in Unna boots.  She was continued on all medications.  Of note, not on SGLT2 secondary to yeast infection and UTIs.  Today, patient is accompanied  by her friend, Pam. She is a very difficult historian. She reports improved lower extremity edema. It sounds like weight has been stable. She takes metolazone as needed. Last dose over a week ago. She admits dietary indiscretion but then states "I am responsible for myself. I do what I need to do." Shortness of breath at baseline secondary to COPD. She does walk at home with a rollator secondary to gait imbalance. Denies chest pain, tightness, pressure. No palpitations. No blood in stool or urine.     ROS: All other systems reviewed and are otherwise negative except as noted in History of Present Illness.  Studies Reviewed    EKG Interpretation Date/Time:  Friday May 11 2023 11:14:33 EDT Ventricular Rate:  76 PR Interval:    QRS Duration:  150 QT Interval:  458 QTC Calculation: 515 R Axis:   235  Text Interpretation: Ventricular-paced rhythm with occasional Premature ventricular complexes Biventricular pacemaker detected When compared with ECG of 19-Jan-2023 10:09, Premature ventricular complexes are now Present Vent. rate has increased BY   3 BPM Confirmed by Carlos Levering [phone]) on 05/11/2023 11:20:05 AM    Risk Assessment/Calculations     CHA2DS2-VASc Score = 7   This indicates a 11.2% annual risk of stroke. The patient's score is based upon: CHF History: 1 HTN History: 1 Diabetes History: 1 Stroke History: 0 Vascular Disease History: 1 Age Score: 2 Gender Score: 1             Physical Exam    VS:  BP (!) 110/58   Pulse 76   Ht 5\' 2"  (1.575 m)   Wt 214 lb (97.1 kg)   SpO2 94%   BMI 39.14 kg/m  , BMI Body mass index is 39.14 kg/m.  GEN: Well nourished, well developed, in no acute distress. Neck: No JVD or carotid bruits. Cardiac:  RRR. 3/6 systolic murmur. No rubs or gallops.   Respiratory:  Respirations regular and unlabored. Clear to auscultation without rales, wheezing or rhonchi. GI: Soft, nontender, nondistended. Extremities: Radials/DP/PT 2+  and equal bilaterally. No clubbing or cyanosis. 1+ pitting edema bilateral lower extremities.   Skin: Warm and dry, no rash. Neuro: Strength intact.  Assessment & Plan    CAD.  S/p CABG x 30 September 2013.  LHC January 2016 showed patent grafts x 3. Patient denies chest pain, pressure or tightness. Continue rosuvastatin, Zetia, Toprol, BiDil.  Not on aspirin secondary to Eliquis. Chronic combined systolic and diastolic heart failure/ischemic cardiomyopathy.  S/p BiV ICD implantation November 2020.  Remote device check July 2024 showed normal device function with stable battery and lead parameters.  Patient reports improved lower extremity edema. Shortness of breath at baseline secondary to COPD. 1+ pitting edema bilateral lower extremities otherwise euvolemic and well compensated on exam. Per CorVue thoracic impedance suggesting normal fluid levels with the exception of possible fluid accumulation from 8/19-9/5.  Continue torsemide, spironolactone, Toprol, BiDil.  Patient not on SGLT2 secondary to yeast infections and UTIs. PAF.  Onset February 2015.  S/p AV node ablation September 2021.  Patient denies  palpitations.  Denies spontaneous bleeding concerns.  EKG shows v paced rhythm. Continue Toprol, Eliquis. Appropriate Eliquis dose. Hypertension. BP today 110/58. Patient denies headaches, dizziness or vision changes. Continue BiDil, Toprol, spironolactone.  Disposition: Return in 6 months or sooner as needed. Follow up with advanced heart failure clinic as instructed.          Signed, Etta Grandchild. Governor Matos, DNP, NP-C

## 2023-05-11 ENCOUNTER — Telehealth: Payer: Self-pay

## 2023-05-11 ENCOUNTER — Ambulatory Visit: Payer: 59 | Attending: Cardiology | Admitting: Student

## 2023-05-11 ENCOUNTER — Encounter: Payer: Self-pay | Admitting: Student

## 2023-05-11 VITALS — BP 110/58 | HR 76 | Ht 62.0 in | Wt 214.0 lb

## 2023-05-11 DIAGNOSIS — I480 Paroxysmal atrial fibrillation: Secondary | ICD-10-CM | POA: Diagnosis not present

## 2023-05-11 DIAGNOSIS — Z95810 Presence of automatic (implantable) cardiac defibrillator: Secondary | ICD-10-CM

## 2023-05-11 DIAGNOSIS — I2510 Atherosclerotic heart disease of native coronary artery without angina pectoris: Secondary | ICD-10-CM

## 2023-05-11 DIAGNOSIS — I5042 Chronic combined systolic (congestive) and diastolic (congestive) heart failure: Secondary | ICD-10-CM | POA: Diagnosis not present

## 2023-05-11 DIAGNOSIS — I10 Essential (primary) hypertension: Secondary | ICD-10-CM

## 2023-05-11 NOTE — Telephone Encounter (Signed)
Remote ICM transmission received.  Attempted call to patient regarding ICM remote transmission and no answer.  

## 2023-05-11 NOTE — Patient Instructions (Signed)
Medication Instructions:  No medication changes *If you need a refill on your cardiac medications before your next appointment, please call your pharmacy*   Lab Work: No labs changes If you have labs (blood work) drawn today and your tests are completely normal, you will receive your results only by: MyChart Message (if you have MyChart) OR A paper copy in the mail If you have any lab test that is abnormal or we need to change your treatment, we will call you to review the results.   Testing/Procedures: none   Follow-Up: At [facility], you and your health needs are our priority.  As part of our continuing mission to provide you with exceptional heart care, we have created designated Provider Care Teams.  These Care Teams include your primary Cardiologist (physician) and Advanced Practice Providers (APPs -  Physician Assistants and Nurse Practitioners) who all work together to provide you with the care you need, when you need it.  We recommend signing up for the patient portal called "MyChart".  Sign up information is provided on this After Visit Summary.  MyChart is used to connect with patients for Virtual Visits (Telemedicine).  Patients are able to view lab/test results, encounter notes, upcoming appointments, etc.  Non-urgent messages can be sent to your provider as well.   To learn more about what you can do with MyChart, go to [email or web address].    Your next appointment:   6 month(s)  Provider:   Bryan Lemma, MD

## 2023-05-11 NOTE — Telephone Encounter (Signed)
Remote ICM transmission received.  Attempted call to patient regarding ICM remote transmission and left no answer.

## 2023-05-11 NOTE — Progress Notes (Signed)
EPIC Encounter for ICM Monitoring  Patient Name: Elaine [name] is a 82 y.o. female Date: 05/11/2023 Primary Care Physican: Cleatis Polka., MD Primary Cardiologist: Harding/McLean Electrophysiologist: Kathreen Cornfield Pacing: 98% 04/26/2022 Weight: 228 lbs 11/01/2022 Office Weight: 223 lbs 02/20/2023 Weight: 218 lbs                                                            Attempted call to patient and unable to reach.  Transmission reviewed.    CorVue thoracic impedance suggesting normal fluid levels with the exception of possible fluid accumulation from 8/19-9/5.    Prescribed:   Torsemide 20 mg take 4 tablets (80 mg total) by mouth every morning and 2 tablets (40 mg total) every evening.   Spironolactone 25 mg take 1 tablet (25 mg total) by mouth daily.   Labs: 03/14/2023 Creatinine 2.41, BUN 65, Potassium 4.8, Sodium 136, GFR 20 02/02/2023 Creatinine 2.09, BUN 45, Potassium 4.0, Sodium 135, GFR 23 01/19/2023 Creatinine 2.09, BUN 57, Potassium 4.1, Sodium 136, GFR 23 11/22/2022 Creatinine 1.98, BUN 39, Potassium 4.7, Sodium 135, GFR 25  11/15/2022 Creatinine 2.11, BUN 43, Potassium 4.4, Sodium 133, GFR 23  A complete set of results can be found in Results Review.   Recommendations: Unable to reach.     Follow-up plan: ICM clinic phone appointment on 06/11/2023.   91 day device clinic remote transmission 06/06/2023.     EP/Cardiology Office Visits: Recall 07/20/2023 with Dr Shirlee Latch.     Recall 07/02/2022 with Dr Elberta Fortis.   Copy of ICM check sent to Dr. Elberta Fortis and Carlos Levering, NP for review (pt was seen in office 9/13 AM).   3 month ICM trend: 05/06/2023.    12-14 Month ICM trend:     Karie Soda, RN 05/11/2023 1:24 PM

## 2023-06-06 ENCOUNTER — Ambulatory Visit (INDEPENDENT_AMBULATORY_CARE_PROVIDER_SITE_OTHER): Payer: 59

## 2023-06-06 ENCOUNTER — Other Ambulatory Visit (HOSPITAL_COMMUNITY): Payer: Self-pay | Admitting: Cardiology

## 2023-06-06 DIAGNOSIS — I255 Ischemic cardiomyopathy: Secondary | ICD-10-CM | POA: Diagnosis not present

## 2023-06-06 LAB — CUP PACEART REMOTE DEVICE CHECK
Battery Remaining Longevity: 48 mo
Battery Remaining Percentage: 52 %
Battery Voltage: 2.95 V
Date Time Interrogation Session: 20241008220626
HighPow Impedance: 62 Ohm
Implantable Lead Connection Status: 753985
Implantable Lead Connection Status: 753985
Implantable Lead Implant Date: 20201130
Implantable Lead Implant Date: 20201130
Implantable Lead Location: 753858
Implantable Lead Location: 753860
Implantable Pulse Generator Implant Date: 20201130
Lead Channel Impedance Value: 440 Ohm
Lead Channel Impedance Value: 530 Ohm
Lead Channel Pacing Threshold Amplitude: 0.75 V
Lead Channel Pacing Threshold Amplitude: 0.75 V
Lead Channel Pacing Threshold Pulse Width: 0.4 ms
Lead Channel Pacing Threshold Pulse Width: 0.5 ms
Lead Channel Sensing Intrinsic Amplitude: 12 mV
Lead Channel Setting Pacing Amplitude: 1.25 V
Lead Channel Setting Pacing Amplitude: 2.5 V
Lead Channel Setting Pacing Pulse Width: 0.5 ms
Lead Channel Setting Pacing Pulse Width: 0.5 ms
Lead Channel Setting Sensing Sensitivity: 0.5 mV
Pulse Gen Serial Number: 810000193
Zone Setting Status: 755011

## 2023-06-11 ENCOUNTER — Ambulatory Visit: Payer: 59 | Attending: Cardiology

## 2023-06-11 DIAGNOSIS — I5022 Chronic systolic (congestive) heart failure: Secondary | ICD-10-CM

## 2023-06-11 DIAGNOSIS — Z95810 Presence of automatic (implantable) cardiac defibrillator: Secondary | ICD-10-CM | POA: Diagnosis not present

## 2023-06-13 ENCOUNTER — Telehealth: Payer: Self-pay

## 2023-06-13 NOTE — Telephone Encounter (Signed)
Pt would like a call to schedule her yearly OV with WC

## 2023-06-13 NOTE — Progress Notes (Signed)
EPIC Encounter for ICM Monitoring  Patient Name: Elaine [name] is a 82 y.o. female Date: 06/13/2023 Primary Care Physican: Cleatis Polka., MD Primary Cardiologist: Harding/McLean Electrophysiologist: Kathreen Cornfield Pacing: 98% 04/26/2022 Weight: 228 lbs 11/01/2022 Office Weight: 223 lbs 02/20/2023 Weight: 218 lbs                                                            Spoke with patient and heart failure questions reviewed.  Transmission results reviewed.  Pt asymptomatic for fluid accumulation.  Reports feeling well at this time and voices no complaints.     CorVue thoracic impedance suggesting normal fluid levels with the exception of possible fluid accumulation from 9/17-9/28.    Prescribed:   Torsemide 20 mg take 4 tablets (80 mg total) by mouth every morning and 2 tablets (40 mg total) every evening.   Spironolactone 25 mg take 1 tablet (25 mg total) by mouth daily.   Labs: 03/14/2023 Creatinine 2.41, BUN 65, Potassium 4.8, Sodium 136, GFR 20 02/02/2023 Creatinine 2.09, BUN 45, Potassium 4.0, Sodium 135, GFR 23 01/19/2023 Creatinine 2.09, BUN 57, Potassium 4.1, Sodium 136, GFR 23 11/22/2022 Creatinine 1.98, BUN 39, Potassium 4.7, Sodium 135, GFR 25  11/15/2022 Creatinine 2.11, BUN 43, Potassium 4.4, Sodium 133, GFR 23  A complete set of results can be found in Results Review.   Recommendations:  No changes and encouraged to call if experiencing any fluid symptoms.   Follow-up plan: ICM clinic phone appointment on 07/16/2023.   91 day device clinic remote transmission 09/05/2023.     EP/Cardiology Office Visits:   Advised to call Dr Alford Highland office and Dr Elberta Fortis' office for appointments.  Recall 07/20/2023 with Dr Shirlee Latch.  Recall 07/02/2022 with Dr Elberta Fortis.   Copy of ICM check sent to Dr. Elberta Fortis.  3 month ICM trend: 06/10/2023.    12-14 Month ICM trend:     Karie Soda, RN 06/13/2023 3:46 PM

## 2023-06-26 NOTE — Progress Notes (Signed)
Remote ICD transmission.   

## 2023-07-03 ENCOUNTER — Ambulatory Visit (INDEPENDENT_AMBULATORY_CARE_PROVIDER_SITE_OTHER): Payer: 59

## 2023-07-03 DIAGNOSIS — I5022 Chronic systolic (congestive) heart failure: Secondary | ICD-10-CM | POA: Diagnosis not present

## 2023-07-03 DIAGNOSIS — I255 Ischemic cardiomyopathy: Secondary | ICD-10-CM

## 2023-07-06 LAB — CUP PACEART REMOTE DEVICE CHECK
Battery Remaining Longevity: 48 mo
Battery Remaining Percentage: 51 %
Battery Voltage: 2.95 V
Date Time Interrogation Session: 20241106193020
HighPow Impedance: 75 Ohm
Implantable Lead Connection Status: 753985
Implantable Lead Connection Status: 753985
Implantable Lead Implant Date: 20201130
Implantable Lead Implant Date: 20201130
Implantable Lead Location: 753858
Implantable Lead Location: 753860
Implantable Pulse Generator Implant Date: 20201130
Lead Channel Impedance Value: 460 Ohm
Lead Channel Impedance Value: 550 Ohm
Lead Channel Pacing Threshold Amplitude: 0.625 V
Lead Channel Pacing Threshold Amplitude: 0.75 V
Lead Channel Pacing Threshold Pulse Width: 0.4 ms
Lead Channel Pacing Threshold Pulse Width: 0.5 ms
Lead Channel Sensing Intrinsic Amplitude: 12 mV
Lead Channel Setting Pacing Amplitude: 1.125
Lead Channel Setting Pacing Amplitude: 2.5 V
Lead Channel Setting Pacing Pulse Width: 0.5 ms
Lead Channel Setting Pacing Pulse Width: 0.5 ms
Lead Channel Setting Sensing Sensitivity: 0.5 mV
Pulse Gen Serial Number: 810000193
Zone Setting Status: 755011

## 2023-07-16 ENCOUNTER — Ambulatory Visit: Payer: 59 | Attending: Cardiology

## 2023-07-16 DIAGNOSIS — Z95810 Presence of automatic (implantable) cardiac defibrillator: Secondary | ICD-10-CM | POA: Diagnosis not present

## 2023-07-16 DIAGNOSIS — I5022 Chronic systolic (congestive) heart failure: Secondary | ICD-10-CM | POA: Diagnosis not present

## 2023-07-16 MED ORDER — POTASSIUM CHLORIDE CRYS ER 20 MEQ PO TBCR: 20.0000 meq | EXTENDED_RELEASE_TABLET | Freq: Every day | ORAL | 3 refills | Status: DC

## 2023-07-16 MED ORDER — TORSEMIDE 20 MG PO TABS: 80.0000 mg | ORAL_TABLET | Freq: Two times a day (BID) | ORAL | 2 refills | Status: DC

## 2023-07-16 NOTE — Progress Notes (Signed)
EPIC Encounter for ICM Monitoring  Patient Name: Elaine [name] is a 82 y.o. female Date: 07/16/2023 Primary Care Physican: Cleatis Polka., MD Primary Cardiologist: Harding/McLean Electrophysiologist: Kathreen Cornfield Pacing: 98% 04/26/2022 Weight: 228 lbs 11/01/2022 Office Weight: 223 lbs 02/20/2023 Weight: 218 lbs 05/11/2023 Office Weight: 214 lbs      07/16/2023 Weight:  215 lbs                                                     Spoke with patient and heart failure questions reviewed.  Transmission results reviewed.  Pt reports leg are swelling during the day.      CorVue thoracic impedance suggesting possible fluid accumulation starting 11/12.    Prescribed:   Torsemide 20 mg take 4 tablets (80 mg total) by mouth every morning and 2 tablets (40 mg total) every evening.  Confirmed 11/18 she is taking prescribed dosage. Spironolactone 25 mg take 1 tablet (25 mg total) by mouth daily.   Labs: 03/14/2023 Creatinine 2.41, BUN 65, Potassium 4.8, Sodium 136, GFR 20 02/02/2023 Creatinine 2.09, BUN 45, Potassium 4.0, Sodium 135, GFR 23 01/19/2023 Creatinine 2.09, BUN 57, Potassium 4.1, Sodium 136, GFR 23 11/22/2022 Creatinine 1.98, BUN 39, Potassium 4.7, Sodium 135, GFR 25  11/15/2022 Creatinine 2.11, BUN 43, Potassium 4.4, Sodium 133, GFR 23  A complete set of results can be found in Results Review.   Recommendations:   Copy sent to Prince Rome, NP at [facility] clinic for review and recommendations if needed.     Follow-up plan: ICM clinic phone appointment on 07/30/2023 to recheck fluid levels (will also be checked at 11/25 OV).   91 day device clinic remote transmission 09/05/2023.     EP/Cardiology Office Visits:   07/23/2023 with HF clinic. 08/07/2022 with Dr Elberta Fortis.   Copy of ICM check sent to Dr. Elberta Fortis.  3 month ICM trend: 07/15/2023.    12-14 Month ICM trend:     Karie Soda, RN 07/16/2023 8:48 AM

## 2023-07-16 NOTE — Progress Notes (Signed)
  Received: Today [place], Anderson Malta, FNP  Sharran Caratachea, Josephine Igo, RN Increase torsemide to 80 mg bid. Add 20 KCL daily. We will repeat labs at her follow up next week. Thanks Kristin Barcus

## 2023-07-16 NOTE — Progress Notes (Signed)
Spoke with patient and advised Prince Rome NP with HF clinic recommended to increase Torsemide to 80 mg twice a day and adding Potassium 20 mEq 1 tablet every morning.  Advised labs will be drawn at next weeks OV.   Epic med list updated and sent to preferred pharmacy.

## 2023-07-20 NOTE — Progress Notes (Signed)
ID:  Elaine [name], DOB 1941/04/08, MRN [id number]  Provider location: [place] [facility] Type of Visit:  Heart Failure Follow up  PCP:  Martha Clan, MD  Primary Cardiologist:  Bryan Lemma, MD EP: Dr Linde Gillis HF Cardiologist: Dr. Shirlee Latch   HPI: Elaine [name] is a 82 y.o. with history of CAD s/p CABG, COPD, CKD stage 3, and chronic systolic systolic CHF/ischemic cardiomyopathy.  She was referred by Dr. Herbie Baltimore for evaluation of CHF.  Patient was admitted in 2/15 with atrial fibrillation/RVR and chest pain.  Cath showed severe 3VD and she had CABG x 3.  Echo in 2/15 showed EF down to 20-25%. In 2018, it appears that she went back into atrial fibrillation and has remained in atrial fibrillation persistently.  DCCV was not attempted.   Most recent echo in 10/20 showed EF < 20% with normal RV.  She had a St Jude CRT-D device implanted in 11/20.  She says that she feels "90%" better with CRT.  She is followed by Dr. Signe Colt with nephrology.   She has been in atrial fibrillation persistently, I attempted DCCV while on amiodarone but she did not convert.  Therefore, I stopped amiodarone.    Echo in 3/21 showed EF 20% with diffuse hypokinesis, mildly dilated RV with mildly decreased systolic function, PASP 49 mmHg, dilated IVC.  In 9/21, she had AV nodal ablation.    She stopped empagliflozin due to UTIs and yeast infections.   Echo 12/22 EF 20-25%, mildly decreased RV function.   She was admitted in 10/23 with E faecalis UTI.  She was found to have T8/9 discitis/osteomyelitis.  TEE showed small TV vegetation that did not appear to involve her device.  Other findings were EF 30%, PASP 60 mmHg, mild MR, moderate-severe TR.  EP thought she was too high risk for device extraction.  She was treated with 6 wks of IV ampicillin and ceftriaxone.  She is now on amoxicillin for suppression.   Echo 3/24 EF 25-30%, mild LV dilation, mild RV dysfunction, mild MR, moderate TR, IVC dilated. .    Today she returns for HF follow up with her friend. Overall feeling fine. She had recent LE swelling 2/2 to increased juice intake, has since cut back and swelling has improved. Her legs ache when she has fluid on board. She uses a walker around her house without significant dyspnea. She uses a wheelchair when outside of her house. Denies palpitations, abnormal bleeding, CP, dizziness, or PND/Orthopnea. Appetite ok. No fever or chills. Weight at home 219 pounds. Taking all medications, takes extra 20 mg torsemide in evenings. Follows with Dr. Signe Colt with nephrology, had labs last week. Unable to get compression hose on.  St Jude device interrogation: 98% BiV pacing, CorVue stable, no VT  ECG (personally reviewed): none ordered today.  Labs (2/21): K 4.5, creatinine 2.08 => 2.99, hgb 12.8 Labs (7/21): K 4, creatinine 2.3, TSH normal Labs (12/21): K 4, creatinine 2.31, BNP 671 Labs (1/22): K 4.1, creatinine 2.48  Labs (3/22): LDL 77 Labs (6/22): K 4.9, creatinine 2.26 Labs (10/22): K 4.8, creatinine 2.41 Labs (12/22): LDL 62, hgb 13.5 Labs (2/23): K 4.4, creatinine 2.28 Labs (11/11/21): K 5.8  Labs (3/23): BUN 52 creatinine 2.5 K 4.7  Labs (5/23): K 4.2, creatinine 2.17  Labs (8/23): K 3.7, creatinine 2.11, hgb 12.5 Labs (12/23): K 4, creatinine 2.14, hgb 10.4 Labs (1/24): K 3.2, creatinine 2.35 Labs (3/24): K 4.4, creatinine 2.11 => 1.98, BNP 302, hgb  12.2 Labs (6/24): 4.0, creatinine 2.09 Labs (7/24): K 4.8, creatinine 2.41 personally reviewed from [facility]  PMH: 1. CAD: s/p CABG in 2/15 with LIMA-LAD, SVG-OM2, SVG-PLV.  2. LBBB: Chronic.  3. Atrial fibrillation: Persistent since 2018.  - LA appendage clip with CABG.  4. H/o aspiration PNA 5. Type 2 diabetes 6. COPD 7. CKD stage 4 8. Chronic systolic CHF: Ischemic cardiomyopathy.  She has a St Jude BiV ICD.  - Echo (2/15): EF 20-25%.  - Echo (5/15): EF 40-45% - Echo (10/20): EF <20%, mild LVH, normal RV size and systolic function.   - Echo (3/21): EF 20% with diffuse hypokinesis, mildly dilated RV with mildly decreased systolic function, PASP 49 mmHg, dilated IVC.  - AV nodal ablation - Echo (12/22): EF 20-25%, mildly decreased RV function. - TEE (10/23): Small TV vegetation that did not appear to involve her device.  Other findings were EF 30%, PASP 60 mmHg, mild MR, moderate-severe TR - Echo (3/24): EF 25-30%, mild LV dilation, mild RV dysfunction, mild MR, moderate TR, IVC dilated.  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 She also had T8/T9 discitis/osteomyelitis.   Social History   Socioeconomic History   Marital status: Widowed    Spouse name: Not on file   Number of children: Not on file   Years of education: Not on file   Highest education level: Not on file  Occupational History   Occupation: retired  Tobacco Use   Smoking status: Former    Current packs/day: 0.00    Average packs/day: 0.5 packs/day for 39.0 years (19.5 ttl pk-yrs)    Types: Cigarettes    Start date: 08/28/1954    Quit date: 08/28/1993    Years since quitting: 29.9   Smokeless tobacco: Never  Vaping Use   Vaping status: Never Used  Substance and Sexual Activity   Alcohol use: No    Comment: quit 95   Drug use: No   Sexual activity: Not on file  Other Topics Concern   Not on file  Social History Narrative   Lives with husband and son in [place].  She is currently retired.   Former smoker quit in 1995.   Social Determinants of Health   Financial Resource Strain: Medium Risk (11/25/2019)   Overall Financial Resource Strain (CARDIA)    Difficulty of Paying Living Expenses: Somewhat hard  Food Insecurity: No Food Insecurity (11/25/2019)   Hunger Vital Sign    Worried About Running Out of Food in the Last Year: Never true    Ran Out of Food in the Last Year: Never true  Transportation Needs: No Transportation Needs (11/25/2019)   PRAPARE - Administrator, Civil Service (Medical): No    Lack  of Transportation (Non-Medical): No  Physical Activity: Not on file  Stress: Not on file  Social Connections: Not on file  Intimate Partner Violence: Not on file   Family History  Adopted: Yes  Problem Relation Age of Onset   Other Mother    Hypertension Mother    Colon cancer Neg Hx    Esophageal cancer Neg Hx    Rectal cancer Neg Hx    Stomach cancer Neg Hx    Current Outpatient Medications  Medication Sig Dispense Refill   acetaminophen (TYLENOL) 650 MG CR tablet Take 1,300 mg by mouth 2 (two) times daily.     albuterol (VENTOLIN HFA) 108 (90 Base) MCG/ACT inhaler Inhale 2 puffs into the lungs every 6 (six) hours as  needed for wheezing or shortness of breath.     amoxicillin (AMOXIL) 500 MG capsule Take 2 capsules (1,000 mg total) by mouth 2 (two) times daily. 120 capsule 11   apixaban (ELIQUIS) 2.5 MG TABS tablet Take 2.5 mg by mouth 2 (two) times daily.     ciprofloxacin (CIPRO) 250 MG tablet Take 250 mg by mouth 2 (two) times daily.     Coenzyme Q10 (COQ-10) 100 MG CAPS Take 100 mg by mouth in the morning and at bedtime.      colchicine 0.6 MG tablet Take 0.6 mg by mouth daily as needed (Gout).      Cranberry 250 MG TABS Take 500 mg by mouth 2 (two) times daily.     Dulaglutide (TRULICITY [place]) Inject 1.5 mg into the skin once a week. Friday     ELDERBERRY PO Take 1 tablet by mouth 2 (two) times daily.     ezetimibe (ZETIA) 10 MG tablet Take 10 mg by mouth daily.     ferrous sulfate 325 (65 FE) MG tablet Take 325 mg by mouth every other day.     isosorbide-hydrALAZINE (BIDIL) 20-37.5 MG tablet TAKE 1/2 (ONE-HALF) TABLET BY MOUTH THREE TIMES DAILY 135 tablet 0   levothyroxine (SYNTHROID) 112 MCG tablet Take 1 tablet (112 mcg total) by mouth daily before breakfast. 30 tablet 12   metoprolol succinate (TOPROL-XL) 50 MG 24 hr tablet Take 1 tablet (50 mg total) by mouth 2 (two) times daily. Take with or immediately following a meal. 180 tablet 2   Misc. Devices (ROLLATOR ULTRA-LIGHT)  MISC UAD to ambulate DX53.81 1 each 0   Multiple Vitamins-Minerals (MULTI FOR HER 50+ PO) Take 1 tablet by mouth daily.     Multiple Vitamins-Minerals (PRESERVISION AREDS 2) CAPS Take 1 capsule by mouth 2 (two) times daily.     potassium chloride SA (KLOR-CON M20) 20 MEQ tablet Take 1 tablet (20 mEq total) by mouth daily. 90 tablet 3   Propylene Glycol (SYSTANE BALANCE) 0.6 % SOLN in the morning and at bedtime.     rosuvastatin (CRESTOR) 5 MG tablet Take 5 mg by mouth every other day.      spironolactone (ALDACTONE) 25 MG tablet Take 1 tablet (25 mg total) by mouth daily. 90 tablet 3   torsemide (DEMADEX) 20 MG tablet Take 4 tablets (80 mg total) by mouth 2 (two) times daily. 720 tablet 2   umeclidinium-vilanterol (ANORO ELLIPTA) 62.5-25 MCG/INH AEPB Inhale 1 puff into the lungs daily.     No current facility-administered medications for this visit.   Wt Readings from Last 3 Encounters:  05/11/23 97.1 kg (214 lb)  03/21/23 99.3 kg (219 lb)  01/19/23 99 kg (218 lb 3.2 oz)   There were no vitals taken for this visit.  Physical Exam General:  NAD. No resp difficulty, arrived in [facility], elderly HEENT: Normal Neck: Supple. No JVD. Carotids 2+ bilat; no bruits. No lymphadenopathy or thryomegaly appreciated. Cor: PMI nondisplaced. Regular rate & rhythm. No rubs, gallops or murmurs. Lungs: Clear Abdomen: Soft, obese, nontender, nondistended. No hepatosplenomegaly. No bruits or masses. Good bowel sounds. Extremities: No cyanosis, clubbing, rash, 1-2+ BLE edema, scattered weeping Neuro: Alert & oriented x 3, cranial nerves grossly intact. Moves all 4 extremities w/o difficulty. Affect pleasant.  Assessment/Plan: 1.Chronic systolic CHF: Ischemic cardiomyopathy.  St Jude CRT-D device, now s/p AV nodal ablation.  Echo in 3/21 showed EF 20% with diffuse hypokinesis, mildly dilated RV with mildly decreased systolic function, PASP 49 mmHg, dilated  IVC.  Echo 12/22 showed EF 20-25%, mildly decreased RV  function.  TEE in 10/23 with EF 30%, PASP 60 mmHg, mild MR, moderate-severe TR, small TV vegetation.  Echo 3/24 showed  EF 25-30%, mild LV dilation, mild RV dysfunction, mild MR, moderate TR. NYHA class III symptoms chronically.  98% BiV pacing by device interrogation today. She is not volume overloaded by CorVue, continues with LLE suspect some of which is chronic venous insufficiency. - Place unna boots - Continue torsemide to 80 qam/60 qpm. Recent labs checked at nephrology stable.  - Continue spironolactone 25 mg daily.  - Continue BiDil 0.5 tab tid (unable to tolerate Bidil 1 tab tid). - Continue Toprol XL 50 mg bid.    - Off empagliflozin due to yeast infection and UTI.  2. CAD: S/p CABG in 2015. No chest pain. - No ASA given apixaban use.  - Continue Crestor 5 qod + Zetia. 3. Atrial fibrillation: Chronic now, looks like it has gone on since 2018.  Unable to cardiovert her on amiodarone so it was stopped. She is now s/p AVN ablation to allow more BiV pacing. Rate controlled.   - Continue apixaban 2.5 mg bid.  4. CKD Stage 4: Most recent creatinine 2.41.   5. 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  Also with T8/9 discitis/osteomyelitis.  - She remains on suppressive amoxicillin per ID.   Follow up in 3-4 months with Dr. Shirlee Latch  Signed, Jacklynn Ganong, FNP  07/20/2023  [facility] [address] [facility] [place] [address] [phone] (office) [phone] (fax)

## 2023-07-23 ENCOUNTER — Ambulatory Visit (HOSPITAL_COMMUNITY)
Admission: RE | Admit: 2023-07-23 | Discharge: 2023-07-23 | Disposition: A | Discharge status: Home / Self Care | Payer: 59 | Source: Ambulatory Visit | Attending: Family Medicine | Admitting: Family Medicine

## 2023-07-23 ENCOUNTER — Telehealth (HOSPITAL_COMMUNITY): Payer: Self-pay | Admitting: Cardiology

## 2023-07-23 ENCOUNTER — Encounter (HOSPITAL_COMMUNITY): Payer: Self-pay

## 2023-07-23 VITALS — BP 108/62 | HR 75 | Wt 209.8 lb

## 2023-07-23 DIAGNOSIS — Z95810 Presence of automatic (implantable) cardiac defibrillator: Secondary | ICD-10-CM | POA: Diagnosis not present

## 2023-07-23 DIAGNOSIS — I4819 Other persistent atrial fibrillation: Secondary | ICD-10-CM | POA: Diagnosis not present

## 2023-07-23 DIAGNOSIS — J449 Chronic obstructive pulmonary disease, unspecified: Secondary | ICD-10-CM

## 2023-07-23 DIAGNOSIS — I5022 Chronic systolic (congestive) heart failure: Secondary | ICD-10-CM

## 2023-07-23 DIAGNOSIS — I330 Acute and subacute infective endocarditis: Secondary | ICD-10-CM | POA: Diagnosis not present

## 2023-07-23 DIAGNOSIS — I2510 Atherosclerotic heart disease of native coronary artery without angina pectoris: Secondary | ICD-10-CM | POA: Insufficient documentation

## 2023-07-23 DIAGNOSIS — I079 Rheumatic tricuspid valve disease, unspecified: Secondary | ICD-10-CM

## 2023-07-23 DIAGNOSIS — I255 Ischemic cardiomyopathy: Secondary | ICD-10-CM | POA: Diagnosis present

## 2023-07-23 DIAGNOSIS — Z951 Presence of aortocoronary bypass graft: Secondary | ICD-10-CM | POA: Insufficient documentation

## 2023-07-23 DIAGNOSIS — N184 Chronic kidney disease, stage 4 (severe): Secondary | ICD-10-CM | POA: Diagnosis not present

## 2023-07-23 DIAGNOSIS — B952 Enterococcus as the cause of diseases classified elsewhere: Secondary | ICD-10-CM | POA: Diagnosis not present

## 2023-07-23 DIAGNOSIS — M4644 Discitis, unspecified, thoracic region: Secondary | ICD-10-CM | POA: Insufficient documentation

## 2023-07-23 DIAGNOSIS — I480 Paroxysmal atrial fibrillation: Secondary | ICD-10-CM

## 2023-07-23 DIAGNOSIS — Z7901 Long term (current) use of anticoagulants: Secondary | ICD-10-CM | POA: Insufficient documentation

## 2023-07-23 DIAGNOSIS — J441 Chronic obstructive pulmonary disease with (acute) exacerbation: Secondary | ICD-10-CM | POA: Insufficient documentation

## 2023-07-23 DIAGNOSIS — Z792 Long term (current) use of antibiotics: Secondary | ICD-10-CM | POA: Insufficient documentation

## 2023-07-23 LAB — CBC
HCT: 35.9 % — ABNORMAL LOW (ref 36.0–46.0)
Hemoglobin: 12.1 g/dL (ref 12.0–15.0)
MCH: 28.7 pg (ref 26.0–34.0)
MCHC: 33.7 g/dL (ref 30.0–36.0)
MCV: 85.3 fL (ref 80.0–100.0)
Platelets: 224 10*3/uL (ref 150–400)
RBC: 4.21 MIL/uL (ref 3.87–5.11)
RDW: 15.8 % — ABNORMAL HIGH (ref 11.5–15.5)
WBC: 6 10*3/uL (ref 4.0–10.5)
nRBC: 0 % (ref 0.0–0.2)

## 2023-07-23 LAB — BASIC METABOLIC PANEL
Anion gap: 16 — ABNORMAL HIGH (ref 5–15)
BUN: 95 mg/dL — ABNORMAL HIGH (ref 8–23)
CO2: 28 mmol/L (ref 22–32)
Calcium: 9.7 mg/dL (ref 8.9–10.3)
Chloride: 85 mmol/L — ABNORMAL LOW (ref 98–111)
Creatinine, Ser: 2.48 mg/dL — ABNORMAL HIGH (ref 0.44–1.00)
GFR, Estimated: 19 mL/min — ABNORMAL LOW (ref >60)
Glucose, Bld: 150 mg/dL — ABNORMAL HIGH (ref 70–99)
Potassium: 2.7 mmol/L — CL (ref 3.5–5.1)
Sodium: 129 mmol/L — ABNORMAL LOW (ref 135–145)

## 2023-07-23 LAB — BRAIN NATRIURETIC PEPTIDE: B Natriuretic Peptide: 366.7 pg/mL — ABNORMAL HIGH (ref 0.0–100.0)

## 2023-07-23 MED ORDER — POTASSIUM CHLORIDE CRYS ER 20 MEQ PO TBCR: EXTENDED_RELEASE_TABLET | ORAL | 3 refills | Status: DC

## 2023-07-23 MED ORDER — PREDNISONE 20 MG PO TABS: 40.0000 mg | ORAL_TABLET | Freq: Every day | ORAL | 0 refills | Status: AC

## 2023-07-23 MED ORDER — PREDNISONE 5 MG PO TABS: 5.0000 mg | ORAL_TABLET | Freq: Every day | ORAL | 0 refills | Status: DC

## 2023-07-23 NOTE — Telephone Encounter (Signed)
Patient called.  Patient aware.  

## 2023-07-23 NOTE — Telephone Encounter (Signed)
-----   Message from Jacklynn Ganong sent at 07/23/2023 11:58 AM EST ----- K too low.  Increase KCL to 40 bid x 2 days, then 40 daily thereafter. Repeat BMET on Wednesday

## 2023-07-23 NOTE — Patient Instructions (Addendum)
Thank you for coming in today  If you had labs drawn today, any labs that are abnormal the clinic will call you No news is good news  Take Covid test and Call Dr. Clelia Croft if symptoms no better by Wednesday  Medications: START Prednisone 40 mg for 5 days for COPD exacerbation  Follow up appointments:  Your physician recommends that you schedule a follow-up appointment in:  3 months with Dr. Earlean Shawl will receive a reminder letter in the mail a few months in advance. If you don't receive a letter, please call our office to schedule the follow-up appointment..   Do the following things EVERYDAY: Weigh yourself in the morning before breakfast. Write it down and keep it in a log. Take your medicines as prescribed Eat low salt foods--Limit salt (sodium) to 2000 mg per day.  Stay as active as you can everyday Limit all fluids for the day to less than 2 liters   At the [facility], you and your health needs are our priority. As part of our continuing mission to provide you with exceptional heart care, we have created designated Provider Care Teams. These Care Teams include your primary Cardiologist (physician) and Advanced Practice Providers (APPs- Physician Assistants and Nurse Practitioners) who all work together to provide you with the care you need, when you need it.   You may see any of the following providers on your designated Care Team at your next follow up: Dr Arvilla Meres Dr Marca Ancona Dr. Marcos Eke, NP Robbie Lis, [place] [facility] [address], [place] Brynda Peon, NP Karle Plumber, PharmD   Please be sure to bring in all your medications bottles to every appointment.    Thank you for choosing [facility]  If you have any questions or concerns before your next appointment please send [place] a message through [place] or call our office at [phone].    TO LEAVE A MESSAGE FOR THE NURSE  SELECT OPTION 2, PLEASE LEAVE A MESSAGE INCLUDING: YOUR NAME DATE OF BIRTH CALL BACK NUMBER REASON FOR CALL**this is important as we prioritize the call backs  YOU WILL RECEIVE A CALL BACK THE SAME DAY AS LONG AS YOU CALL BEFORE 4:00 PM

## 2023-07-25 ENCOUNTER — Ambulatory Visit (HOSPITAL_COMMUNITY)
Admission: RE | Admit: 2023-07-25 | Discharge: 2023-07-25 | Disposition: A | Discharge status: Home / Self Care | Payer: 59 | Source: Ambulatory Visit | Attending: Cardiology | Admitting: Cardiology

## 2023-07-25 DIAGNOSIS — I5022 Chronic systolic (congestive) heart failure: Secondary | ICD-10-CM | POA: Diagnosis present

## 2023-07-25 LAB — BASIC METABOLIC PANEL
Anion gap: 17 — ABNORMAL HIGH (ref 5–15)
BUN: 100 mg/dL — ABNORMAL HIGH (ref 8–23)
CO2: 26 mmol/L (ref 22–32)
Calcium: 10.5 mg/dL — ABNORMAL HIGH (ref 8.9–10.3)
Chloride: 90 mmol/L — ABNORMAL LOW (ref 98–111)
Creatinine, Ser: 2.56 mg/dL — ABNORMAL HIGH (ref 0.44–1.00)
GFR, Estimated: 18 mL/min — ABNORMAL LOW (ref >60)
Glucose, Bld: 228 mg/dL — ABNORMAL HIGH (ref 70–99)
Potassium: 3.8 mmol/L (ref 3.5–5.1)
Sodium: 133 mmol/L — ABNORMAL LOW (ref 135–145)

## 2023-07-30 ENCOUNTER — Ambulatory Visit: Payer: 59 | Attending: Cardiology

## 2023-07-30 DIAGNOSIS — I5022 Chronic systolic (congestive) heart failure: Secondary | ICD-10-CM

## 2023-07-30 DIAGNOSIS — Z95810 Presence of automatic (implantable) cardiac defibrillator: Secondary | ICD-10-CM

## 2023-07-30 NOTE — Progress Notes (Signed)
EPIC Encounter for ICM Monitoring  Patient Name: Elaine [name] is a 82 y.o. female Date: 07/30/2023 Primary Care Physican: Cleatis Polka., MD Primary Cardiologist: [place] Electrophysiologist: Kathreen Cornfield Pacing: 98% 04/26/2022 Weight: 228 lbs 11/01/2022 Office Weight: 223 lbs 02/20/2023 Weight: 218 lbs 05/11/2023 Office Weight: 214 lbs      07/16/2023 Weight:  215 lbs                                                     Spoke with patient and heart failure questions reviewed.  Transmission results reviewed.  Pt reports leg swelling has resolved.        CorVue thoracic impedance suggesting fluid levels returned to normal after Torsemide dosage increased.     Prescribed:   Torsemide 20 mg take 4 tablets (80 mg total) by mouth twice a day Potassium 20 mEq take 2 tablets (40 mEq total) by mouth daily Spironolactone 25 mg take 1 tablet (25 mg total) by mouth daily.   Labs: 07/25/2023 Creatinine 2.56, BUN 100, Potassium 3.8, Sodium 133, GFR 18 07/23/2023 Creatinine 2.48, BUN 95, Potassium 2.7, Sodium 129, GFR 19 03/14/2023 Creatinine 2.41, BUN 65, Potassium 4.8, Sodium 136, GFR 20 02/02/2023 Creatinine 2.09, BUN 45, Potassium 4.0, Sodium 135, GFR 23 01/19/2023 Creatinine 2.09, BUN 57, Potassium 4.1, Sodium 136, GFR 23 11/22/2022 Creatinine 1.98, BUN 39, Potassium 4.7, Sodium 135, GFR 25  11/15/2022 Creatinine 2.11, BUN 43, Potassium 4.4, Sodium 133, GFR 23  A complete set of results can be found in Results Review.   Recommendations:        Follow-up plan: ICM clinic phone appointment on 08/27/2023.   91 day device clinic remote transmission 09/05/2023.     EP/Cardiology Office Visits:   Recall 10/21/2023 with Dr Shirlee Latch. 08/07/2022 with Dr Elberta Fortis.  10/22/2023 with Dr Herbie Baltimore.   Copy of ICM check sent to Dr. Elberta Fortis.  3 month ICM trend: 07/29/2023.    12-14 Month ICM trend:     Karie Soda, RN 07/30/2023 4:42 PM

## 2023-07-31 NOTE — Progress Notes (Signed)
Remote ICD transmission.   

## 2023-08-08 ENCOUNTER — Ambulatory Visit: Payer: 59 | Attending: Cardiology | Admitting: Cardiology

## 2023-08-08 NOTE — Progress Notes (Unsigned)
  Electrophysiology Office Note:   Date:  08/08/2023  ID:  Elaine [name], DOB [birth year], MRN [id number]  Primary Cardiologist: Bryan Lemma, MD Electrophysiologist: Tish Begin Jorja Loa, MD  {Click to update primary MD,subspecialty MD or APP then REFRESH:1}    History of Present Illness:   Elaine [name] is a 82 y.o. female with h/o chronic systolic heart failure, coronary artery disease post CABG with left atrial appendage clip, permanent atrial fibrillation, hypertension, hyperlipidemia, diabetes, hypothyroidism, CKD stage III seen today for routine electrophysiology followup.   She was admitted to the hospital October 2023 with enterococcal faecalis UTI.  She was then found to have osteomyelitis.  She had a small tricuspid valve vegetations that did not involve her device.  She was treated with ampicillin and ceftriaxone.  She is on amoxicillin for suppression.  Since last being seen in our clinic the patient reports doing ***.  she denies chest pain, palpitations, dyspnea, PND, orthopnea, nausea, vomiting, dizziness, syncope, edema, weight gain, or early satiety.   Review of systems complete and found to be negative unless listed in HPI.      EP Information / Studies Reviewed:    {EKGtoday:28818}      ICD Interrogation-  reviewed in detail today,  See PACEART report.  Device History: Abbott BiV ICD implanted 07/28/2019 for CHF History of appropriate therapy: No History of AAD therapy: No   Risk Assessment/Calculations:    CHA2DS2-VASc Score = 7  {Confirm score is correct.  If not, click here to update score.  REFRESH note.  :1} This indicates a 11.2% annual risk of stroke. The patient's score is based upon: CHF History: 1 HTN History: 1 Diabetes History: 1 Stroke History: 0 Vascular Disease History: 1 Age Score: 2 Gender Score: 1   {This patient has a significant risk of stroke if diagnosed with atrial fibrillation.  Please consider VKA or DOAC agent for  anticoagulation if the bleeding risk is acceptable.   You can also use the SmartPhrase .[email or web address] for documentation.   :[id number]} No BP recorded.  {Refresh Note OR Click here to enter BP  :1}***        Physical Exam:   VS:  There were no vitals taken for this visit.   Wt Readings from Last 3 Encounters:  07/23/23 209 lb 12.8 oz (95.2 kg)  05/11/23 214 lb (97.1 kg)  03/21/23 219 lb (99.3 kg)     GEN: Well nourished, well developed in no acute distress NECK: No JVD; No carotid bruits CARDIAC: {EPRHYTHM:28826}, no murmurs, rubs, gallops RESPIRATORY:  Clear to auscultation without rales, wheezing or rhonchi  ABDOMEN: Soft, non-tender, non-distended EXTREMITIES:  No edema; No deformity   ASSESSMENT AND PLAN:    Chronic systolic dysfunction s/p Abbott CRT-D  euvolemic today Stable on an appropriate medical regimen Normal ICD function See Pace Art report No changes today  2.  Permanent atrial fibrillation: Post AV node ablation 02/17/2020.  3.  Coronary artery disease: Post CABG.  No current chest pain.  4.  Secondary hypercoagulable state: Currently on Eliquis for atrial fibrillation  Disposition:   Follow up with {EPPROVIDERS:28135} {EPFOLLOW VZ:56387}   Signed, Ladarrious Kirksey Jorja Loa, MD

## 2023-08-09 ENCOUNTER — Encounter: Payer: Self-pay | Admitting: Cardiology

## 2023-08-23 NOTE — Progress Notes (Addendum)
  Electrophysiology Office Note:   Date:  08/28/2023  ID:  Elaine [name], DOB 01-Mar-1941, MRN [id number]  Primary Cardiologist: Alm Clay, MD Primary Heart Failure: Ezra Shuck, MD Electrophysiologist: Will Gladis Norton, MD      History of Present Illness:   Elaine [name] is a 82 y.o. female with h/o permanent AF, chronic sCHF, CAD s/p CABG with LAA clip, HTN, HLD, DM, hypothyroidism, CKD III seen today for routine electrophysiology followup.   She was admitted to the hospital October 2023 with enterococcal faecalis UTI.  She was then found to have osteomyelitis.  She had a small tricuspid valve vegetations that did not involve her device.  She was treated with ampicillin  and ceftriaxone .  She is on amoxicillin  for suppression.  Since last being seen in our clinic the patient reports she has bee doing well.  No device specific complaints.    She denies chest pain, palpitations, dyspnea, PND, orthopnea, nausea, vomiting, dizziness, syncope, edema, weight gain, or early satiety.   Review of systems complete and found to be negative unless listed in HPI.   EP Information / Studies Reviewed:    EKG is not ordered today. EKG from 05/11/23 reviewed which showed VP 76 bpm, PVC's      Device:  Abbott Dual Chamber CRT-D, implanted 07/28/19 for LBBB, sCHF. Atrial port plugged.   Studies:  TEE 05/2022 > LVEF 30%, LV global hypokinesis, no vegetation noted on pacing wires in RA/RV, severely elevated pulmonary artery systolic pressure, RVSP 60 mmHg, LAA surgically ligated, LA moderately dilated, no LA/LAA thrombus, RA mildly dilated, small <1cm vegetation on TV, TV with mod-severe regurgitation   Arrhythmia / AAD Permanent AF  AV Nodal Ablation 05/19/20   Risk Assessment/Calculations:    CHA2DS2-VASc Score = 7   This indicates a 11.2% annual risk of stroke. The patient's score is based upon: CHF History: 1 HTN History: 1 Diabetes History: 1 Stroke History: 0 Vascular Disease  History: 1 Age Score: 2 Gender Score: 1             Physical Exam:   VS:  BP (!) 136/50   Pulse 70   Ht 5' 2 (1.575 m)   Wt 215 lb 3.2 oz (97.6 kg)   SpO2 95%   BMI 39.36 kg/m    Wt Readings from Last 3 Encounters:  08/28/23 215 lb 3.2 oz (97.6 kg)  07/23/23 209 lb 12.8 oz (95.2 kg)  05/11/23 214 lb (97.1 kg)     GEN: Well nourished, well developed in no acute distress NECK: No JVD; No carotid bruits CARDIAC: Regular rate and rhythm, no murmurs, rubs, gallops RESPIRATORY:  Clear to auscultation without rales, wheezing or rhonchi  ABDOMEN: Soft, non-tender, non-distended EXTREMITIES:  No edema; No deformity   ASSESSMENT AND PLAN:    Chronic Systolic Dysfunction s/p Abbott CRT-D  -euvolemic today -Stable on an appropriate medical regimen -Normal ICD function -See Pace Art report -No changes today   TV Endocarditis 2023, no vegetation on pacing wires on TEE  -suppressive abx per ID  Permanent Atrial Fibrillation Post AV node ablation 05/19/2020 -OAC for stroke prophylaxis  -continue Toprol  50 mg BID   Secondary Hypercoagulable State  -continue Eliquis  2.5mg  BID, dose reviewed and appropriate    CAD s/p CABG  -no anginal symptoms    Follow up with Dr. Norton in 6 months  Signed, Daphne Barrack, MSN, APRN, NP-C, AGACNP-BC [facility] - Electrophysiology  08/28/2023, 5:42 PM

## 2023-08-27 ENCOUNTER — Ambulatory Visit: Payer: 59 | Attending: Cardiology

## 2023-08-27 DIAGNOSIS — I5022 Chronic systolic (congestive) heart failure: Secondary | ICD-10-CM | POA: Diagnosis not present

## 2023-08-27 DIAGNOSIS — Z95810 Presence of automatic (implantable) cardiac defibrillator: Secondary | ICD-10-CM

## 2023-08-28 ENCOUNTER — Ambulatory Visit: Payer: 59 | Attending: Pulmonary Disease | Admitting: Pulmonary Disease

## 2023-08-28 ENCOUNTER — Encounter: Payer: Self-pay | Admitting: Pulmonary Disease

## 2023-08-28 VITALS — BP 136/50 | HR 70 | Ht 62.0 in | Wt 215.2 lb

## 2023-08-28 DIAGNOSIS — I4821 Permanent atrial fibrillation: Secondary | ICD-10-CM

## 2023-08-28 DIAGNOSIS — I5022 Chronic systolic (congestive) heart failure: Secondary | ICD-10-CM | POA: Diagnosis not present

## 2023-08-28 DIAGNOSIS — I2510 Atherosclerotic heart disease of native coronary artery without angina pectoris: Secondary | ICD-10-CM

## 2023-08-28 DIAGNOSIS — Z95810 Presence of automatic (implantable) cardiac defibrillator: Secondary | ICD-10-CM

## 2023-08-28 DIAGNOSIS — D6869 Other thrombophilia: Secondary | ICD-10-CM

## 2023-08-28 LAB — CUP PACEART INCLINIC DEVICE CHECK
Battery Remaining Longevity: 46 mo
Brady Statistic RA Percent Paced: 0 %
Brady Statistic RV Percent Paced: 98 %
Date Time Interrogation Session: 20241231174441
HighPow Impedance: 58.5 Ohm
Implantable Lead Connection Status: 753985
Implantable Lead Connection Status: 753985
Implantable Lead Implant Date: 20201130
Implantable Lead Implant Date: 20201130
Implantable Lead Location: 753858
Implantable Lead Location: 753860
Implantable Pulse Generator Implant Date: 20201130
Lead Channel Impedance Value: 462.5 Ohm
Lead Channel Impedance Value: 550 Ohm
Lead Channel Pacing Threshold Amplitude: 0.625 V
Lead Channel Pacing Threshold Amplitude: 0.875 V
Lead Channel Pacing Threshold Pulse Width: 0.5 ms
Lead Channel Pacing Threshold Pulse Width: 0.5 ms
Lead Channel Sensing Intrinsic Amplitude: 12 mV
Lead Channel Setting Pacing Amplitude: 1.375
Lead Channel Setting Pacing Amplitude: 1.875
Lead Channel Setting Pacing Pulse Width: 0.5 ms
Lead Channel Setting Pacing Pulse Width: 0.5 ms
Lead Channel Setting Sensing Sensitivity: 0.5 mV
Pulse Gen Serial Number: 810000193
Zone Setting Status: 755011

## 2023-08-28 NOTE — Patient Instructions (Signed)
 Medication Instructions:  Your physician recommends that you continue on your current medications as directed. Please refer to the Current Medication list given to you today.  *If you need a refill on your cardiac medications before your next appointment, please call your pharmacy*  Lab Work: If you have labs (blood work) drawn today and your tests are completely normal, you will receive your results only by: MyChart Message (if you have MyChart) OR A paper copy in the mail If you have any lab test that is abnormal or we need to change your treatment, we will call you to review the results.   Testing/Procedures: None ordered today.  Follow-Up: At [facility], you and your health needs are our priority.  As part of our continuing mission to provide you with exceptional heart care, we have created designated Provider Care Teams.  These Care Teams include your primary Cardiologist (physician) and Advanced Practice Providers (APPs -  Physician Assistants and Nurse Practitioners) who all work together to provide you with the care you need, when you need it.  We recommend signing up for the patient portal called MyChart.  Sign up information is provided on this After Visit Summary.  MyChart is used to connect with patients for Virtual Visits (Telemedicine).  Patients are able to view lab/test results, encounter notes, upcoming appointments, etc.  Non-urgent messages can be sent to your provider as well.   To learn more about what you can do with MyChart, go to [email or web address].    Your next appointment:   5 month(s)  Provider:   You may see Will Gladis Norton, MD or one of the following Advanced Practice Providers on your designated Care Team:   Daphne Barrack, NP

## 2023-09-05 ENCOUNTER — Other Ambulatory Visit: Payer: Self-pay | Admitting: Internal Medicine

## 2023-10-01 ENCOUNTER — Ambulatory Visit: Payer: 59 | Attending: Cardiology

## 2023-10-01 DIAGNOSIS — Z95810 Presence of automatic (implantable) cardiac defibrillator: Secondary | ICD-10-CM | POA: Diagnosis not present

## 2023-10-01 DIAGNOSIS — I5022 Chronic systolic (congestive) heart failure: Secondary | ICD-10-CM | POA: Diagnosis not present

## 2023-10-02 ENCOUNTER — Ambulatory Visit (INDEPENDENT_AMBULATORY_CARE_PROVIDER_SITE_OTHER): Payer: 59

## 2023-10-02 DIAGNOSIS — I5022 Chronic systolic (congestive) heart failure: Secondary | ICD-10-CM | POA: Diagnosis not present

## 2023-10-02 DIAGNOSIS — I255 Ischemic cardiomyopathy: Secondary | ICD-10-CM

## 2023-10-03 ENCOUNTER — Other Ambulatory Visit (HOSPITAL_COMMUNITY): Payer: Self-pay | Admitting: Cardiology

## 2023-10-04 ENCOUNTER — Other Ambulatory Visit: Payer: Self-pay

## 2023-10-04 ENCOUNTER — Ambulatory Visit (INDEPENDENT_AMBULATORY_CARE_PROVIDER_SITE_OTHER): Payer: 59 | Admitting: Internal Medicine

## 2023-10-04 ENCOUNTER — Encounter: Payer: Self-pay | Admitting: Internal Medicine

## 2023-10-04 VITALS — BP 120/78 | HR 73 | Resp 16 | Ht 62.0 in | Wt 215.2 lb

## 2023-10-04 DIAGNOSIS — Z950 Presence of cardiac pacemaker: Secondary | ICD-10-CM | POA: Diagnosis not present

## 2023-10-04 DIAGNOSIS — B952 Enterococcus as the cause of diseases classified elsewhere: Secondary | ICD-10-CM

## 2023-10-04 DIAGNOSIS — R7881 Bacteremia: Secondary | ICD-10-CM | POA: Diagnosis not present

## 2023-10-04 DIAGNOSIS — I330 Acute and subacute infective endocarditis: Secondary | ICD-10-CM | POA: Diagnosis not present

## 2023-10-04 MED ORDER — AMOXICILLIN 500 MG PO CAPS: 500.0000 mg | ORAL_CAPSULE | Freq: Two times a day (BID) | ORAL | 11 refills | Status: AC

## 2023-10-04 NOTE — Progress Notes (Signed)
 [facility]  Patient Active Problem List   Diagnosis Date Noted   Bacteremia    Acute on chronic systolic CHF (congestive heart failure) (HCC) 06/03/2022   Cystitis 06/03/2022   Chronic respiratory failure with hypoxia (HCC) 06/03/2022   Endocarditis of tricuspid valve 06/03/2022   Class 3 obesity 06/03/2022   Carpal tunnel syndrome, right upper limb 10/18/2020   Carpal tunnel syndrome, left upper limb 10/18/2020   Chronic systolic (congestive) heart failure (HCC) 07/29/2019   Hypothyroidism 06/16/2019   Acute on chronic combined systolic and diastolic CHF (congestive heart failure) (HCC) 06/11/2019   Bilateral leg pain 10/22/2017   Chronic combined systolic and diastolic CHF, NYHA class 2 (HCC) 10/22/2017   Edema of both legs 05/10/2015   Mass of right breast on mammogram 10/06/2014   DOE (dyspnea on exertion) 08/19/2014   Severe obesity (BMI >= 40) (HCC) 04/19/2014   CAD status post CABG 04/19/2014   Metabolic acidosis 02/05/2014    Class: Acute   Acute on chronic renal insufficiency 02/02/2014   Near syncope 02/02/2014   Hypotension 02/02/2014   Rash 02/02/2014   Pruritus 02/02/2014    Class: Chronic   SOB (shortness of breath) 01/07/2014   Coumadin  Rx post CABG 11/17/2013   Physical deconditioning 11/05/2013   S/P CABG x 3- 10/23/13 10/23/2013   Permanent atrial fibrillation (HCC) - CHA2DS2-VASc Score 6, on Eliquis  10/20/2013   Left bundle branch block (LBBB) on electrocardiogram 10/20/2013   Left main coronary artery disease 10/20/2013    Class: Hospitalized for   History of acute anterior wall myocardial infarction 10/20/2013   Ischemic cardiomyopathy 10/20/2013   Morbid obesity- BMI 48    Diabetic neuropathy (HCC)    Acute kidney injury superimposed on chronic kidney disease (HCC)    Post corneal transplant 08/05/2012   Obesity 12/20/2006   Type 2 diabetes mellitus with hyperlipidemia (HCC) 08/06/2006   Hyperlipidemia with  target LDL less than 70 08/06/2006   Essential hypertension 08/06/2006   Osteoarthritis 08/06/2006      Subjective:    Patient ID: Elaine [name], female    DOB: [birth year], 83 y.o.   MRN: [id number]  Chief Complaint  Patient presents with   Follow-up    HPI:  Elaine [name] is a 83 y.o. female with failed right cornea transplant x2, copd on 3 liters o2, chf/ischemic cardiomyoapthy, pacemaker/icd, here for hospital admission 05/2022 f/u for enterococcus faecalis tv endocarditis with vertebral om  Tee didn't see pacer vegetation/thrombus She was discharged to snf with ampicillin /ceftriaxone   She somehow was put on unasyn/ceftriaxone  rather. She is staying at the [facility] care center.  We called the nursing home to fax over labs today 07/04/22  She is feeling more energetic and stronger Chronic dyspnea baseline 3 liters o2 and bilateral LE edema. She does think the edema is a little worse with the iv abx.  Back pain also better She was using fww prior to admission Currently she uses fww but also depends on wheel chair at the nursing home. She is wearing depends She does rehab 5 days a week. She thinks she is about 5-10% of her baseline function   08/08/22 id clinic f/u Discharged from snf on 07/31/22. Finished iv abx 06/2022. Doesn't know and has no rx for amoxicillin  abx She feels well -- good appetite, no fever, chill, rash Chronic bilateral knee pain No back pain outside of now and then twinge She is getting in-home rehab  now pt/ot She has a thoracic mri on 12/14 that she is not awared about -- there was a fluid collection lumbar area so I had wanted to f/u on that  Can walk with help 6-8 feet currently No complaint today   10/04/23 id clinic f/u Patient here for f/u endocarditis/pacer infection with e faecalis On suppressive abx  Some uri/copd sx recently resolving Taking amoxicillin  500 mg twice a day and out for 2 weeks No fever, chill No new focal joint  pain/midline back pain Appetite is good No n/v/diarrhea    Allergies  Allergen Reactions   Crestor  [Rosuvastatin  Calcium ] Other (See Comments)    Cramps- still taking    Allopurinol Hives   Cephalosporins Rash   Tape Rash    Adhesive tape      Outpatient Medications Prior to Visit  Medication Sig Dispense Refill   acetaminophen  (TYLENOL ) 650 MG CR tablet Take 1,300 mg by mouth 2 (two) times daily.     albuterol  (VENTOLIN  HFA) 108 (90 Base) MCG/ACT inhaler Inhale 2 puffs into the lungs every 6 (six) hours as needed for wheezing or shortness of breath.     apixaban  (ELIQUIS ) 2.5 MG TABS tablet Take 2.5 mg by mouth 2 (two) times daily.     ciprofloxacin  (CIPRO ) 250 MG tablet Take 250 mg by mouth 2 (two) times daily.     Coenzyme Q10 (COQ-10) 100 MG CAPS Take 100 mg by mouth in the morning and at bedtime.      colchicine  0.6 MG tablet Take 0.6 mg by mouth daily as needed (Gout).      Cranberry 250 MG TABS Take 500 mg by mouth 2 (two) times daily.     Dulaglutide  (TRULICITY  [facility]) Inject 1.5 mg into the skin once a week. Friday     ELDERBERRY PO Take 1 tablet by mouth 2 (two) times daily.     ezetimibe  (ZETIA ) 10 MG tablet Take 10 mg by mouth daily.     ferrous sulfate  325 (65 FE) MG tablet Take 325 mg by mouth every other day.     isosorbide -hydrALAZINE  (BIDIL ) 20-37.5 MG tablet TAKE 1/2 (ONE-HALF) TABLET BY MOUTH THREE TIMES DAILY 135 tablet 0   levothyroxine  (SYNTHROID ) 112 MCG tablet Take 1 tablet (112 mcg total) by mouth daily before breakfast. 30 tablet 12   metoprolol  succinate (TOPROL -XL) 50 MG 24 hr tablet Take 1 tablet (50 mg total) by mouth 2 (two) times daily. Take with or immediately following a meal. 180 tablet 2   Misc. Devices (ROLLATOR ULTRA-LIGHT) MISC UAD to ambulate DX53.81 1 each 0   Multiple Vitamins-Minerals (MULTI FOR HER 50+ PO) Take 1 tablet by mouth daily.     Multiple Vitamins-Minerals (PRESERVISION AREDS 2) CAPS Take 1 capsule by mouth 2 (two) times daily.      potassium chloride  SA (KLOR-CON  M20) 20 MEQ tablet Take 2 tablets (40 mEq total) by mouth 2 (two) times daily for 2 days, THEN 2 tablets (40 mEq total) daily. 64 tablet 3   Propylene Glycol (SYSTANE BALANCE) 0.6 % SOLN in the morning and at bedtime.     rosuvastatin  (CRESTOR ) 5 MG tablet Take 5 mg by mouth every other day.      spironolactone  (ALDACTONE ) 25 MG tablet Take 1 tablet (25 mg total) by mouth daily. 90 tablet 3   torsemide  (DEMADEX ) 20 MG tablet Take 4 tablets (80 mg total) by mouth 2 (two) times daily. 720 tablet 2   umeclidinium-vilanterol (ANORO ELLIPTA ) 62.5-25 MCG/INH AEPB Inhale  1 puff into the lungs daily.     No facility-administered medications prior to visit.     Social History   Socioeconomic History   Marital status: Widowed    Spouse name: Not on file   Number of children: Not on file   Years of education: Not on file   Highest education level: Not on file  Occupational History   Occupation: retired  Tobacco Use   Smoking status: Former    Current packs/day: 0.00    Average packs/day: 0.5 packs/day for 39.0 years (19.5 ttl pk-yrs)    Types: Cigarettes    Start date: 08/28/1954    Quit date: 08/28/1993    Years since quitting: 30.1   Smokeless tobacco: Never  Vaping Use   Vaping status: Never Used  Substance and Sexual Activity   Alcohol  use: No    Comment: quit 95   Drug use: No   Sexual activity: Not on file  Other Topics Concern   Not on file  Social History Narrative   Lives with husband and son in [place].  She is currently retired.   Former smoker quit in 1995.   Social Drivers of Health   Financial Resource Strain: Medium Risk (11/25/2019)   Overall Financial Resource Strain (CARDIA)    Difficulty of Paying Living Expenses: Somewhat hard  Food Insecurity: No Food Insecurity (11/25/2019)   Hunger Vital Sign    Worried About Running Out of Food in the Last Year: Never true    Ran Out of Food in the Last Year: Never true  Transportation  Needs: No Transportation Needs (11/25/2019)   PRAPARE - Administrator, Civil Service (Medical): No    Lack of Transportation (Non-Medical): No  Physical Activity: Not on file  Stress: Not on file  Social Connections: Not on file  Intimate Partner Violence: Not on file      Review of Systems    All other ros negative  Objective:    BP 120/78   Pulse 73   Resp 16   Ht 5' 2 (1.575 m)   Wt 215 lb 3.2 oz (97.6 kg)   BMI 39.36 kg/m  Nursing note and vital signs reviewed.  Physical Exam     General/constitutional: no distress, pleasant; in wheel chair HEENT: Normocephalic, PER, old right conj scarring CV: rrr no mrg Lungs: normal respiratory effort Abd: Soft, Nontender Ext: 2-3+ edema to bilateral LE Skin: No Rash Neuro: bilateral proximal LE strength 3-4/5; left a little stronger MSK: t8-10 very mild 1/10 discomfort     Labs: Lab Results  Component Value Date   WBC 6.0 07/23/2023   HGB 12.1 07/23/2023   HCT 35.9 (L) 07/23/2023   MCV 85.3 07/23/2023   PLT 224 07/23/2023   Last metabolic panel Lab Results  Component Value Date   GLUCOSE 228 (H) 07/25/2023   NA 133 (L) 07/25/2023   K 3.8 07/25/2023   CL 90 (L) 07/25/2023   CO2 26 07/25/2023   BUN 100 (H) 07/25/2023   CREATININE 2.56 (H) 07/25/2023   GFRNONAA 18 (L) 07/25/2023   CALCIUM  10.5 (H) 07/25/2023   PROT 7.1 08/08/2022   ALBUMIN  1.9 (L) 06/05/2022   LABGLOB 3.1 03/09/2020   AGRATIO 1.2 03/09/2020   BILITOT 0.8 08/08/2022   ALKPHOS 97 06/05/2022   AST 25 08/08/2022   ALT 18 08/08/2022   ANIONGAP 17 (H) 07/25/2023     Micro:  Serology:  Imaging: reviewed  10/08 mri t spine 1. Markedly  limited exam due to the degree of motion artifact and lack of IV contrast. 2. Within this limitation, there are findings that are worrisome for discitis/osteomyelitis at T8-T9 with a small fluid collection along the left lateral aspect of the T8-T9 vertebral body level  measuring approximately 1.9 x 1.3 cm. No definite evidence of epidural abscess, but assessment is markedly limited due to the degree of motion artifact. Recommend further evaluation with a contrast enhanced exam if the patient is able to limit movement at time of imaging. 3. Small bilateral pleural effusions     08/10/22 mr t spine without contrast 1. Progressive changes of diskitis and osteomyelitis at T8-9. No definite epidural or paraspinal abscess. 2. No new sites of discitis or osteomyelitis. 3. Significant S-shaped thoracic scoliotic curvature. 4. Multilevel disc disease and facet disease but no large disc protrusions or canal stenosis.   Assessment & Plan:   Problem List Items Addressed This Visit   None Visit Diagnoses       Bacteremia due to Enterococcus    -  Primary     Infective endocarditis, due to unspecified organism, unspecified chronicity         Presence of cardiac pacemaker       Relevant Orders   CBC   COMPLETE METABOLIC PANEL WITH GFR         83 y.o. female with failed right cornea transplant x2, copd on 3 liters o2, chf/ischemic cardiomyoapthy, pacemaker/icd, here for hospital admission 05/2022 f/u for enterococcus faecalis tv endocarditis with vertebral om  Discussed with nursing home to change amp-sulb to amp alone 2 gram q8hrs; continue ceftriaxone  2 gram q12 Those abx go till 07/16/22 then transition to amox 1 gram q12hrs to adjust for renal function Mri t spine 2-3 weeks See me early 07/2022  See pcp for chf / edema management  ----------- 08/08/22 id clinic assessment Hasn't started amox maintenant pacer infection tx yet since 11/19, after discharged from snf 12/4. No sign of sepsis Will rx today 1 gram twice a day adjusted for renal function Remind her to do mri t-spine 12/14 F/u with me in 4-6 weeks  F/u pcp for chronic medical problems care    10/04/23 id clinic f/u Patient had transitioned to amoxicillin  suppressive abx 500 mg bid  adjusted for renal function Ckd stage 4 seeing nephrology Pacer infection/vertebral om seems under control Off abx 2 weeks and will need to restart -- continue indefinitely  Getting over a uri/copd exacerbation with prednisone  taper  F/u pcp as needed for copd; dry cough still there but better  Labs today   F/u twice a year   Follow-up: Return in about 6 months (around 04/02/2024).      Elaine ONEIDA Passer, MD [facility] 10/04/2023, 2:52 PM

## 2023-10-04 NOTE — Patient Instructions (Signed)
 Continue amoxicillin  500 mg twice a day (1 tab twice a day)   Labs today   See your regular doctor for copd/cough

## 2023-10-05 ENCOUNTER — Telehealth: Payer: Self-pay

## 2023-10-05 LAB — COMPLETE METABOLIC PANEL WITH GFR
AG Ratio: 1.3 (calc) (ref 1.0–2.5)
ALT: 31 U/L — ABNORMAL HIGH (ref 6–29)
AST: 26 U/L (ref 10–35)
Albumin: 4.1 g/dL (ref 3.6–5.1)
Alkaline phosphatase (APISO): 113 U/L (ref 37–153)
BUN/Creatinine Ratio: 31 (calc) — ABNORMAL HIGH (ref 6–22)
BUN: 81 mg/dL — ABNORMAL HIGH (ref 7–25)
CO2: 29 mmol/L (ref 20–32)
Calcium: 10 mg/dL (ref 8.6–10.4)
Chloride: 87 mmol/L — ABNORMAL LOW (ref 98–110)
Creat: 2.62 mg/dL — ABNORMAL HIGH (ref 0.60–0.95)
Globulin: 3.1 g/dL (ref 1.9–3.7)
Glucose, Bld: 495 mg/dL — ABNORMAL HIGH (ref 65–99)
Potassium: 4.5 mmol/L (ref 3.5–5.3)
Sodium: 131 mmol/L — ABNORMAL LOW (ref 135–146)
Total Bilirubin: 1.3 mg/dL — ABNORMAL HIGH (ref 0.2–1.2)
Total Protein: 7.2 g/dL (ref 6.1–8.1)
eGFR: 18 mL/min/{1.73_m2} — ABNORMAL LOW (ref >=60)

## 2023-10-05 LAB — CBC
HCT: 41.1 % (ref 35.0–45.0)
Hemoglobin: 12.7 g/dL (ref 11.7–15.5)
MCH: 28.7 pg (ref 27.0–33.0)
MCHC: 30.9 g/dL — ABNORMAL LOW (ref 32.0–36.0)
MCV: 93 fL (ref 80.0–100.0)
MPV: 12.2 fL (ref 7.5–12.5)
Platelets: 202 10*3/uL (ref 140–400)
RBC: 4.42 10*6/uL (ref 3.80–5.10)
RDW: 14.6 % (ref 11.0–15.0)
WBC: 7.9 10*3/uL (ref 3.8–10.8)

## 2023-10-05 NOTE — Telephone Encounter (Signed)
 Spoke with [name], relayed that BG was very high at 495. Advised her to follow up with PCP for management. She states she has an appointment with them in March. Advised her to check her BG now and to call her PCP with the results and further recommendations.   Discussed ED precautions such as increased thirst, increased urination, blurry vision, confusion. Patient verbalized understanding and has no further questions.   Notified her that results would be forwarded to PCP.   Neetu Carrozza D Sahian Kerney, RN

## 2023-10-05 NOTE — Telephone Encounter (Signed)
-----   Message from [place] sent at 10/05/2023 11:39 AM EST ----- Hi team  Could you please let her know she'll need to see primary care for better diabetes control Her sugar was very high  thanks

## 2023-10-05 NOTE — Telephone Encounter (Signed)
Thanks Meg

## 2023-10-08 ENCOUNTER — Ambulatory Visit: Payer: 59 | Attending: Cardiology

## 2023-10-08 DIAGNOSIS — I5022 Chronic systolic (congestive) heart failure: Secondary | ICD-10-CM

## 2023-10-08 DIAGNOSIS — Z95810 Presence of automatic (implantable) cardiac defibrillator: Secondary | ICD-10-CM

## 2023-10-09 ENCOUNTER — Other Ambulatory Visit: Payer: Self-pay | Admitting: Internal Medicine

## 2023-10-09 DIAGNOSIS — Z1231 Encounter for screening mammogram for malignant neoplasm of breast: Secondary | ICD-10-CM

## 2023-10-09 NOTE — Progress Notes (Signed)
EPIC Encounter for ICM Monitoring  Patient Name: Elaine [name] is a 83 y.o. female Date: 10/09/2023 Primary Care Physican: Cleatis Polka., MD Primary Cardiologist: Hard[place] Electrophysiologist: Kathreen Cornfield Pacing: 98% 04/26/2022 Weight: 228 lbs 11/01/2022 Office Weight: 223 lbs 02/20/2023 Weight: 218 lbs 05/11/2023 Office Weight: 214 lbs      07/16/2023 Weight:  215 lbs    10/09/2023 Weight: 206 lbs                                                  Spoke with patient and heart failure questions reviewed.  Transmission results reviewed.  Pt asymptomatic for fluid accumulation.  Reports feeling well at this time and voices no complaints.     CorVue thoracic impedance suggesting fluid levels returned to normal.     Prescribed:   Torsemide 20 mg take 4 tablets (80 mg total) by mouth twice a day Potassium 20 mEq take 2 tablets (40 mEq total) by mouth daily Spironolactone 25 mg take 1 tablet (25 mg total) by mouth daily.   Labs: 10/04/2023 Creatinine 2.62, BUN 81, Potassium 4.5, Sodium 131, GFR 18 07/25/2023 Creatinine 2.56, BUN 100, Potassium 3.8, Sodium 133, GFR 18 07/23/2023 Creatinine 2.48, BUN 95, Potassium 2.7, Sodium 129, GFR 19 A complete set of results can be found in Results Review.   Recommendations:  No changes and encouraged to call if experiencing any fluid symptoms.   Follow-up plan: ICM clinic phone appointment on 11/05/2023.   91 day device clinic remote transmission 12/05/2023.     EP/Cardiology Office Visits:  11/19/2023 with Dr Herbie Baltimore.   Recall 10/21/2023 with Dr Shirlee Latch.   01/18/2024 with Francis Dowse, PA.    Copy of ICM check sent to Dr. Elberta Fortis.   3 month ICM trend: 10/07/2023.    12-14 Month ICM trend:     Karie Soda, RN 10/09/2023 7:55 AM

## 2023-10-10 LAB — CUP PACEART REMOTE DEVICE CHECK
Battery Remaining Longevity: 48 mo
Battery Remaining Percentage: 49 %
Battery Voltage: 2.95 V
Date Time Interrogation Session: 20250202210703
HighPow Impedance: 70 Ohm
Implantable Lead Connection Status: 753985
Implantable Lead Connection Status: 753985
Implantable Lead Implant Date: 20201130
Implantable Lead Implant Date: 20201130
Implantable Lead Location: 753858
Implantable Lead Location: 753860
Implantable Pulse Generator Implant Date: 20201130
Lead Channel Impedance Value: 510 Ohm
Lead Channel Impedance Value: 650 Ohm
Lead Channel Pacing Threshold Amplitude: 0.5 V
Lead Channel Pacing Threshold Amplitude: 0.625 V
Lead Channel Pacing Threshold Pulse Width: 0.5 ms
Lead Channel Pacing Threshold Pulse Width: 0.5 ms
Lead Channel Sensing Intrinsic Amplitude: 12 mV
Lead Channel Setting Pacing Amplitude: 1.125
Lead Channel Setting Pacing Amplitude: 1.75 V
Lead Channel Setting Pacing Pulse Width: 0.5 ms
Lead Channel Setting Pacing Pulse Width: 0.5 ms
Lead Channel Setting Sensing Sensitivity: 0.5 mV
Pulse Gen Serial Number: 810000193
Zone Setting Status: 755011

## 2023-10-26 ENCOUNTER — Other Ambulatory Visit: Payer: Self-pay

## 2023-10-26 ENCOUNTER — Inpatient Hospital Stay (HOSPITAL_COMMUNITY): Payer: 59

## 2023-10-26 ENCOUNTER — Inpatient Hospital Stay (HOSPITAL_COMMUNITY)
Admission: EM | Admit: 2023-10-26 | Discharge: 2023-11-02 | DRG: 690 | Disposition: A | Discharge status: Home Health | Payer: 59 | Attending: Internal Medicine | Admitting: Internal Medicine

## 2023-10-26 ENCOUNTER — Emergency Department (HOSPITAL_COMMUNITY): Payer: 59

## 2023-10-26 DIAGNOSIS — N184 Chronic kidney disease, stage 4 (severe): Secondary | ICD-10-CM | POA: Insufficient documentation

## 2023-10-26 DIAGNOSIS — Z951 Presence of aortocoronary bypass graft: Secondary | ICD-10-CM

## 2023-10-26 DIAGNOSIS — N189 Chronic kidney disease, unspecified: Secondary | ICD-10-CM

## 2023-10-26 DIAGNOSIS — E039 Hypothyroidism, unspecified: Secondary | ICD-10-CM | POA: Diagnosis present

## 2023-10-26 DIAGNOSIS — R32 Unspecified urinary incontinence: Secondary | ICD-10-CM | POA: Diagnosis present

## 2023-10-26 DIAGNOSIS — E1140 Type 2 diabetes mellitus with diabetic neuropathy, unspecified: Secondary | ICD-10-CM | POA: Diagnosis present

## 2023-10-26 DIAGNOSIS — Z91048 Other nonmedicinal substance allergy status: Secondary | ICD-10-CM

## 2023-10-26 DIAGNOSIS — Z7901 Long term (current) use of anticoagulants: Secondary | ICD-10-CM

## 2023-10-26 DIAGNOSIS — Z79890 Hormone replacement therapy (postmenopausal): Secondary | ICD-10-CM

## 2023-10-26 DIAGNOSIS — B9620 Unspecified Escherichia coli [E. coli] as the cause of diseases classified elsewhere: Secondary | ICD-10-CM | POA: Diagnosis present

## 2023-10-26 DIAGNOSIS — Z7985 Long-term (current) use of injectable non-insulin antidiabetic drugs: Secondary | ICD-10-CM

## 2023-10-26 DIAGNOSIS — E119 Type 2 diabetes mellitus without complications: Secondary | ICD-10-CM | POA: Diagnosis not present

## 2023-10-26 DIAGNOSIS — R310 Gross hematuria: Secondary | ICD-10-CM | POA: Diagnosis present

## 2023-10-26 DIAGNOSIS — I4821 Permanent atrial fibrillation: Secondary | ICD-10-CM | POA: Diagnosis present

## 2023-10-26 DIAGNOSIS — N179 Acute kidney failure, unspecified: Secondary | ICD-10-CM | POA: Diagnosis present

## 2023-10-26 DIAGNOSIS — N320 Bladder-neck obstruction: Secondary | ICD-10-CM | POA: Diagnosis present

## 2023-10-26 DIAGNOSIS — I4510 Unspecified right bundle-branch block: Secondary | ICD-10-CM | POA: Diagnosis present

## 2023-10-26 DIAGNOSIS — E875 Hyperkalemia: Secondary | ICD-10-CM | POA: Diagnosis present

## 2023-10-26 DIAGNOSIS — Z6839 Body mass index (BMI) 39.0-39.9, adult: Secondary | ICD-10-CM

## 2023-10-26 DIAGNOSIS — N3091 Cystitis, unspecified with hematuria: Principal | ICD-10-CM | POA: Diagnosis present

## 2023-10-26 DIAGNOSIS — Z66 Do not resuscitate: Secondary | ICD-10-CM | POA: Diagnosis present

## 2023-10-26 DIAGNOSIS — I2510 Atherosclerotic heart disease of native coronary artery without angina pectoris: Secondary | ICD-10-CM | POA: Diagnosis present

## 2023-10-26 DIAGNOSIS — I5020 Unspecified systolic (congestive) heart failure: Secondary | ICD-10-CM | POA: Diagnosis present

## 2023-10-26 DIAGNOSIS — Z1619 Resistance to other specified beta lactam antibiotics: Secondary | ICD-10-CM | POA: Diagnosis present

## 2023-10-26 DIAGNOSIS — Z881 Allergy status to other antibiotic agents status: Secondary | ICD-10-CM

## 2023-10-26 DIAGNOSIS — E785 Hyperlipidemia, unspecified: Secondary | ICD-10-CM | POA: Diagnosis present

## 2023-10-26 DIAGNOSIS — I255 Ischemic cardiomyopathy: Secondary | ICD-10-CM | POA: Diagnosis present

## 2023-10-26 DIAGNOSIS — Z7951 Long term (current) use of inhaled steroids: Secondary | ICD-10-CM

## 2023-10-26 DIAGNOSIS — J4489 Other specified chronic obstructive pulmonary disease: Secondary | ICD-10-CM | POA: Diagnosis present

## 2023-10-26 DIAGNOSIS — Z947 Corneal transplant status: Secondary | ICD-10-CM | POA: Diagnosis not present

## 2023-10-26 DIAGNOSIS — R338 Other retention of urine: Secondary | ICD-10-CM

## 2023-10-26 DIAGNOSIS — E669 Obesity, unspecified: Secondary | ICD-10-CM | POA: Diagnosis present

## 2023-10-26 DIAGNOSIS — I700 Atherosclerosis of aorta: Secondary | ICD-10-CM | POA: Diagnosis present

## 2023-10-26 DIAGNOSIS — I130 Hypertensive heart and chronic kidney disease with heart failure and stage 1 through stage 4 chronic kidney disease, or unspecified chronic kidney disease: Secondary | ICD-10-CM | POA: Diagnosis present

## 2023-10-26 DIAGNOSIS — J449 Chronic obstructive pulmonary disease, unspecified: Secondary | ICD-10-CM | POA: Diagnosis not present

## 2023-10-26 DIAGNOSIS — R319 Hematuria, unspecified: Secondary | ICD-10-CM | POA: Diagnosis present

## 2023-10-26 DIAGNOSIS — M1990 Unspecified osteoarthritis, unspecified site: Secondary | ICD-10-CM | POA: Diagnosis present

## 2023-10-26 DIAGNOSIS — N390 Urinary tract infection, site not specified: Secondary | ICD-10-CM | POA: Diagnosis present

## 2023-10-26 DIAGNOSIS — D62 Acute posthemorrhagic anemia: Secondary | ICD-10-CM | POA: Diagnosis present

## 2023-10-26 DIAGNOSIS — E1122 Type 2 diabetes mellitus with diabetic chronic kidney disease: Secondary | ICD-10-CM | POA: Diagnosis present

## 2023-10-26 DIAGNOSIS — Z90710 Acquired absence of both cervix and uterus: Secondary | ICD-10-CM

## 2023-10-26 DIAGNOSIS — Z87891 Personal history of nicotine dependence: Secondary | ICD-10-CM

## 2023-10-26 DIAGNOSIS — E871 Hypo-osmolality and hyponatremia: Secondary | ICD-10-CM | POA: Diagnosis present

## 2023-10-26 DIAGNOSIS — I959 Hypotension, unspecified: Secondary | ICD-10-CM | POA: Diagnosis present

## 2023-10-26 DIAGNOSIS — Z888 Allergy status to other drugs, medicaments and biological substances status: Secondary | ICD-10-CM

## 2023-10-26 DIAGNOSIS — I5022 Chronic systolic (congestive) heart failure: Secondary | ICD-10-CM | POA: Diagnosis present

## 2023-10-26 DIAGNOSIS — E66812 Obesity, class 2: Secondary | ICD-10-CM

## 2023-10-26 DIAGNOSIS — Z96641 Presence of right artificial hip joint: Secondary | ICD-10-CM | POA: Diagnosis present

## 2023-10-26 DIAGNOSIS — Z8249 Family history of ischemic heart disease and other diseases of the circulatory system: Secondary | ICD-10-CM

## 2023-10-26 DIAGNOSIS — I252 Old myocardial infarction: Secondary | ICD-10-CM

## 2023-10-26 DIAGNOSIS — Z95810 Presence of automatic (implantable) cardiac defibrillator: Secondary | ICD-10-CM

## 2023-10-26 DIAGNOSIS — Z79899 Other long term (current) drug therapy: Secondary | ICD-10-CM

## 2023-10-26 LAB — CBC
HCT: 34.6 % — ABNORMAL LOW (ref 36.0–46.0)
Hemoglobin: 11.3 g/dL — ABNORMAL LOW (ref 12.0–15.0)
MCH: 29.7 pg (ref 26.0–34.0)
MCHC: 32.7 g/dL (ref 30.0–36.0)
MCV: 90.8 fL (ref 80.0–100.0)
Platelets: 232 10*3/uL (ref 150–400)
RBC: 3.81 MIL/uL — ABNORMAL LOW (ref 3.87–5.11)
RDW: 17 % — ABNORMAL HIGH (ref 11.5–15.5)
WBC: 11.5 10*3/uL — ABNORMAL HIGH (ref 4.0–10.5)
nRBC: 0 % (ref 0.0–0.2)

## 2023-10-26 LAB — BASIC METABOLIC PANEL
Anion gap: 18 — ABNORMAL HIGH (ref 5–15)
BUN: 102 mg/dL — ABNORMAL HIGH (ref 8–23)
CO2: 16 mmol/L — ABNORMAL LOW (ref 22–32)
Calcium: 9.8 mg/dL (ref 8.9–10.3)
Chloride: 94 mmol/L — ABNORMAL LOW (ref 98–111)
Creatinine, Ser: 4.49 mg/dL — ABNORMAL HIGH (ref 0.44–1.00)
GFR, Estimated: 9 mL/min — ABNORMAL LOW (ref >60)
Glucose, Bld: 224 mg/dL — ABNORMAL HIGH (ref 70–99)
Potassium: 6.2 mmol/L — ABNORMAL HIGH (ref 3.5–5.1)
Sodium: 128 mmol/L — ABNORMAL LOW (ref 135–145)

## 2023-10-26 LAB — URINALYSIS, W/ REFLEX TO CULTURE (INFECTION SUSPECTED): RBC / HPF: >50 RBC/hpf (ref 0–5)

## 2023-10-26 LAB — HEMOGLOBIN A1C
Hgb A1c MFr Bld: 7.2 % — ABNORMAL HIGH (ref 4.8–5.6)
Mean Plasma Glucose: 159.94 mg/dL

## 2023-10-26 LAB — HEMOGLOBIN AND HEMATOCRIT, BLOOD
HCT: 28.8 % — ABNORMAL LOW (ref 36.0–46.0)
HCT: 28.3 % — ABNORMAL LOW (ref 36.0–46.0)
Hemoglobin: 9.5 g/dL — ABNORMAL LOW (ref 12.0–15.0)
Hemoglobin: 9.8 g/dL — ABNORMAL LOW (ref 12.0–15.0)

## 2023-10-26 LAB — PREPARE RBC (CROSSMATCH)

## 2023-10-26 LAB — BRAIN NATRIURETIC PEPTIDE: B Natriuretic Peptide: 217.9 pg/mL — ABNORMAL HIGH (ref 0.0–100.0)

## 2023-10-26 LAB — POTASSIUM: Potassium: 5.6 mmol/L — ABNORMAL HIGH (ref 3.5–5.1)

## 2023-10-26 MED ORDER — INSULIN ASPART 100 UNIT/ML IJ SOLN
0.0000 [IU] | Freq: Three times a day (TID) | INTRAMUSCULAR | Status: DC
  Administered 2023-10-27 (×2): 1 [IU] via SUBCUTANEOUS
  Administered 2023-10-29: 2 [IU] via SUBCUTANEOUS
  Administered 2023-10-29 – 2023-11-01 (×5): 1 [IU] via SUBCUTANEOUS

## 2023-10-26 MED ORDER — ALBUMIN HUMAN 25 % IV SOLN
12.5000 g | Freq: Once | INTRAVENOUS | Status: AC
  Administered 2023-10-26: 12.5 g via INTRAVENOUS
  Filled 2023-10-26: qty 50

## 2023-10-26 MED ORDER — SODIUM CHLORIDE 0.9 % IV SOLN: INTRAVENOUS | Status: AC

## 2023-10-26 MED ORDER — UMECLIDINIUM-VILANTEROL 62.5-25 MCG/ACT IN AEPB
1.0000 | INHALATION_SPRAY | Freq: Every day | RESPIRATORY_TRACT | Status: DC
  Administered 2023-10-27 – 2023-11-02 (×7): 1 via RESPIRATORY_TRACT
  Filled 2023-10-26 (×2): qty 14

## 2023-10-26 MED ORDER — FENTANYL CITRATE PF 50 MCG/ML IJ SOSY
25.0000 ug | PREFILLED_SYRINGE | Freq: Once | INTRAMUSCULAR | Status: AC
  Administered 2023-10-26: 25 ug via INTRAVENOUS
  Filled 2023-10-26: qty 1

## 2023-10-26 MED ORDER — ROSUVASTATIN CALCIUM 5 MG PO TABS
5.0000 mg | ORAL_TABLET | ORAL | Status: DC
  Administered 2023-10-26 – 2023-11-02 (×4): 5 mg via ORAL
  Filled 2023-10-26 (×5): qty 1

## 2023-10-26 MED ORDER — POLYVINYL ALCOHOL 1.4 % OP SOLN
2.0000 [drp] | Freq: Two times a day (BID) | OPHTHALMIC | Status: DC
  Administered 2023-10-26 – 2023-11-02 (×14): 2 [drp] via OPHTHALMIC
  Filled 2023-10-26: qty 15

## 2023-10-26 MED ORDER — PROPYLENE GLYCOL 0.6 % OP SOLN: 1.0000 [drp] | Freq: Two times a day (BID) | OPHTHALMIC | Status: DC

## 2023-10-26 MED ORDER — SERTRALINE HCL 100 MG PO TABS
100.0000 mg | ORAL_TABLET | Freq: Every day | ORAL | Status: DC
  Administered 2023-10-26 – 2023-11-02 (×8): 100 mg via ORAL
  Filled 2023-10-26 (×8): qty 1

## 2023-10-26 MED ORDER — SODIUM CHLORIDE 0.9% FLUSH
3.0000 mL | Freq: Two times a day (BID) | INTRAVENOUS | Status: DC
  Administered 2023-10-26 – 2023-11-02 (×13): 3 mL via INTRAVENOUS

## 2023-10-26 MED ORDER — SODIUM CHLORIDE 0.9 % IV SOLN: INTRAVENOUS | Status: DC

## 2023-10-26 MED ORDER — DEXTROSE 50 % IV SOLN: 1.0000 | INTRAVENOUS | Status: DC | PRN

## 2023-10-26 MED ORDER — SODIUM CHLORIDE 0.9 % IR SOLN
3000.0000 mL | Status: DC
  Administered 2023-10-26 – 2023-10-30 (×38): 3000 mL

## 2023-10-26 MED ORDER — COLCHICINE 0.6 MG PO TABS: 0.6000 mg | ORAL_TABLET | Freq: Every day | ORAL | Status: DC | PRN

## 2023-10-26 MED ORDER — SODIUM ZIRCONIUM CYCLOSILICATE 5 G PO PACK
5.0000 g | PACK | Freq: Every day | ORAL | Status: DC
Start: 2023-10-26 — End: 2023-10-27
  Administered 2023-10-26 – 2023-10-27 (×2): 5 g via ORAL
  Filled 2023-10-26 (×3): qty 1

## 2023-10-26 MED ORDER — SODIUM CHLORIDE 0.9 % IV SOLN
2.0000 g | INTRAVENOUS | Status: DC
  Administered 2023-10-26 – 2023-10-28 (×3): 2 g via INTRAVENOUS
  Filled 2023-10-26 (×3): qty 20

## 2023-10-26 MED ORDER — FERROUS SULFATE 325 (65 FE) MG PO TABS
325.0000 mg | ORAL_TABLET | ORAL | Status: DC
  Administered 2023-10-26 – 2023-11-02 (×4): 325 mg via ORAL
  Filled 2023-10-26 (×5): qty 1

## 2023-10-26 MED ORDER — SODIUM CHLORIDE 0.9% IV SOLUTION: Freq: Once | INTRAVENOUS | Status: AC

## 2023-10-26 MED ORDER — LEVOTHYROXINE SODIUM 112 MCG PO TABS
112.0000 ug | ORAL_TABLET | Freq: Every day | ORAL | Status: DC
  Administered 2023-10-27 – 2023-11-02 (×6): 112 ug via ORAL
  Filled 2023-10-26 (×7): qty 1

## 2023-10-26 MED ORDER — ALBUTEROL SULFATE (2.5 MG/3ML) 0.083% IN NEBU: 2.5000 mg | INHALATION_SOLUTION | Freq: Four times a day (QID) | RESPIRATORY_TRACT | Status: DC | PRN

## 2023-10-26 NOTE — ED Provider Notes (Signed)
 [place] [facility]MENT AT [facility] Provider Note   CSN: [id number] Arrival date & time: 10/26/23  0703     History  Chief Complaint  Patient presents with   Hematuria    Elaine [name] is a 83 y.o. female.   Pt is a 83y/o female with hx of asthma, CHF, CKD for which she has declined to begin dialysis, ischemic cardiomyopathy status post CABG and AICD, atrial fibrillation on Eliquis, diabetes who is presenting today with EMS initially reports she called out for shortness of breath which resolved after using her inhaler but her bigger complaint is a lower abdominal discomfort and feeling the urge but being unable to urinate.  She also thought she may be having vaginal bleeding or blood in her urine as she was having blood coming from that area of her body.  She denies any bloody bowel movements.  She reports that she had 1 episode of emesis yesterday afternoon but is just continued to have lower abdominal discomfort and has not felt like she has been able to urinate normally since yesterday afternoon.  She denies a history of similar symptoms.  She has not had a fever.  Denies vaginal bleeding in the past since menopause.  The history is provided by the patient.  Hematuria       Home Medications Prior to Admission medications   Medication Sig Start Date End Date Taking? Authorizing Provider  acetaminophen (TYLENOL) 650 MG CR tablet Take 1,300 mg by mouth 2 (two) times daily.   Yes [provider]  albuterol (VENTOLIN HFA) 108 (90 Base) MCG/ACT inhaler Inhale 2 puffs into the lungs every 6 (six) hours as needed for wheezing or shortness of breath.   Yes [provider]  amoxicillin (AMOXIL) 500 MG capsule Take 1 capsule (500 mg total) by mouth 2 (two) times daily. 10/04/23 09/28/24 Yes Vu, Tonita Phoenix, MD  apixaban (ELIQUIS) 2.5 MG TABS tablet Take 2.5 mg by mouth 2 (two) times daily.   Yes [provider]  Coenzyme Q10 (COQ-10) 100 MG CAPS Take  100 mg by mouth 2 (two) times daily.   Yes [provider]  colchicine 0.6 MG tablet Take 0.6 mg by mouth daily as needed (Gout).    Yes [provider]  Cranberry 250 MG TABS Take 500 mg by mouth 2 (two) times daily.   Yes [provider]  Dulaglutide 1.5 MG/0.5ML SOAJ Inject 1.5 mg into the skin once a week. Friday   Yes [provider]  ELDERBERRY PO Take 1 tablet by mouth 2 (two) times daily.   Yes [provider]  ezetimibe (ZETIA) 10 MG tablet Take 10 mg by mouth daily.   Yes [provider]  ferrous sulfate 325 (65 FE) MG tablet Take 325 mg by mouth every Monday, Wednesday, and Friday.   Yes [provider]  isosorbide-hydrALAZINE (BIDIL) 20-37.5 MG tablet TAKE 1/2 (ONE-HALF) TABLET BY MOUTH THREE TIMES DAILY 06/06/23  Yes Laurey Morale, MD  levothyroxine (SYNTHROID) 112 MCG tablet Take 1 tablet (112 mcg total) by mouth daily before breakfast. 02/05/14  Yes Garlan Fillers, MD  metoprolol succinate (TOPROL-XL) 50 MG 24 hr tablet Take 1 tablet (50 mg total) by mouth 2 (two) times daily. Take with or immediately following a meal. 04/20/23  Yes Laurey Morale, MD  Multiple Vitamins-Minerals (MULTI FOR HER 50+ PO) Take 1 tablet by mouth daily.   Yes [provider]  Multiple Vitamins-Minerals (PRESERVISION AREDS 2)  CAPS Take 1 capsule by mouth 2 (two) times daily.   Yes [provider]  potassium chloride SA (KLOR-CON M20) 20 MEQ tablet Take 2 tablets (40 mEq total) by mouth 2 (two) times daily for 2 days, THEN 2 tablets (40 mEq total) daily. Patient taking differently: Take (1 tablet) by mouth twice daily.  07/23/23 07/24/24 Yes Milford, Anderson Malta, FNP  Propylene Glycol (SYSTANE BALANCE) 0.6 % SOLN Place 1 drop into both eyes in the morning and at bedtime.   Yes [provider]  rosuvastatin (CRESTOR) 5 MG tablet Take 5 mg by mouth every Monday, Wednesday, and Friday.   Yes [provider]   sertraline (ZOLOFT) 100 MG tablet Take 100 mg by mouth daily. 10/10/23  Yes [provider]  spironolactone (ALDACTONE) 25 MG tablet TAKE 1 TABLET BY MOUTH DAILY 10/04/23  Yes Laurey Morale, MD  torsemide (DEMADEX) 20 MG tablet Take 4 tablets (80 mg total) by mouth 2 (two) times daily. 07/16/23 10/26/23 Yes Milford, Anderson Malta, FNP  TRELEGY ELLIPTA 100-62.5-25 MCG/ACT AEPB Inhale 1 puff into the lungs daily. 10/14/23  Yes [provider]  umeclidinium-vilanterol (ANORO ELLIPTA) 62.5-25 MCG/INH AEPB Inhale 1 puff into the lungs daily.   Yes [provider]  predniSONE (STERAPRED UNI-PAK 21 TAB) 10 MG (21) TBPK tablet Take by mouth daily. Patient not taking: Reported on 10/26/2023    [provider]      Allergies    Crestor [rosuvastatin calcium], Allopurinol, Cephalosporins, and Tape    Review of Systems   Review of Systems  Genitourinary:  Positive for hematuria.    Physical Exam Updated Vital Signs BP (!) 155/128   Pulse 74   Temp 98.4 F (36.9 C)   Resp 16   Wt 97 kg   SpO2 100%   BMI 39.11 kg/m  Physical Exam Vitals and nursing note reviewed.  Constitutional:      General: She is not in acute distress.    Appearance: She is well-developed.  HENT:     Head: Normocephalic and atraumatic.  Eyes:     Extraocular Movements: Extraocular movements intact.     Comments: Opacification of the right eye  Cardiovascular:     Rate and Rhythm: Normal rate and regular rhythm.     Heart sounds: Normal heart sounds. No murmur heard.    No friction rub.  Pulmonary:     Effort: Pulmonary effort is normal.     Breath sounds: Normal breath sounds. No wheezing or rales.  Abdominal:     General: Bowel sounds are normal. There is no distension.     Palpations: Abdomen is soft.     Tenderness: There is abdominal tenderness. There is no guarding or rebound.  Genitourinary:    Comments: Blood continually trickling from the urethral area Musculoskeletal:         General: No tenderness. Normal range of motion.     Comments: No edema  Skin:    General: Skin is warm and dry.     Findings: No rash.  Neurological:     Mental Status: She is alert and oriented to person, place, and time.     Cranial Nerves: No cranial nerve deficit.  Psychiatric:        Behavior: Behavior normal.     ED Results / Procedures / Treatments   Labs (all labs ordered are listed, but only abnormal results are displayed) Labs Reviewed  BASIC METABOLIC PANEL - Abnormal; Notable for the following  components:      Result Value   Sodium 128 (*)    Potassium 6.2 (*)    Chloride 94 (*)    CO2 16 (*)    Glucose, Bld 224 (*)    BUN 102 (*)    Creatinine, Ser 4.49 (*)    GFR, Estimated 9 (*)    Anion gap 18 (*)    All other components within normal limits  CBC - Abnormal; Notable for the following components:   WBC 11.5 (*)    RBC 3.81 (*)    Hemoglobin 11.3 (*)    HCT 34.6 (*)    RDW 17.0 (*)    All other components within normal limits  URINALYSIS, W/ REFLEX TO CULTURE (INFECTION SUSPECTED) - Abnormal; Notable for the following components:   Color, Urine RED (*)    APPearance TURBID (*)    Glucose, UA   (*)    Value: TEST NOT REPORTED DUE TO COLOR INTERFERENCE OF URINE PIGMENT   Hgb urine dipstick   (*)    Value: TEST NOT REPORTED DUE TO COLOR INTERFERENCE OF URINE PIGMENT   Bilirubin Urine   (*)    Value: TEST NOT REPORTED DUE TO COLOR INTERFERENCE OF URINE PIGMENT   Ketones, ur   (*)    Value: TEST NOT REPORTED DUE TO COLOR INTERFERENCE OF URINE PIGMENT   Protein, ur   (*)    Value: TEST NOT REPORTED DUE TO COLOR INTERFERENCE OF URINE PIGMENT   Nitrite   (*)    Value: TEST NOT REPORTED DUE TO COLOR INTERFERENCE OF URINE PIGMENT   Leukocytes,Ua   (*)    Value: TEST NOT REPORTED DUE TO COLOR INTERFERENCE OF URINE PIGMENT   Bacteria, UA RARE (*)    All other components within normal limits  POTASSIUM - Abnormal; Notable for the following components:    Potassium 5.6 (*)    All other components within normal limits    EKG EKG Interpretation Date/Time:  Friday October 26 2023 10:06:40 EST Ventricular Rate:  73 PR Interval:  158 QRS Duration:  181 QT Interval:  501 QTC Calculation: 553 R Axis:   268  Text Interpretation: Biventricular pacemaker detected Right bundle branch block No significant change since last tracing Confirmed by Gwyneth Sprout ([id number]) on 10/26/2023 10:36:58 AM  Radiology No results found.  Procedures Procedures    Medications Ordered in ED Medications  fentaNYL (SUBLIMAZE) injection 25 mcg (has no administration in time range)    ED Course/ Medical Decision Making/ A&P                                 Medical Decision Making Amount and/or Complexity of Data Reviewed Independent Historian: EMS External Data Reviewed: notes. Labs: ordered. Decision-making details documented in ED Course. Radiology: ordered and independent interpretation performed. Decision-making details documented in ED Course. ECG/medicine tests: ordered and independent interpretation performed. Decision-making details documented in ED Course.  Risk Prescription drug management. Decision regarding hospitalization.   Pt with multiple medical problems and comorbidities and presenting today with a complaint that caries a high risk for morbidity and mortality.  Here today with concerns for urinary retention and hematuria.  Patient otherwise is in no acute distress with normal vital signs.  Does have multiple medical problems and concern for worsening kidney function due to concern for retention as well.  Also possibility for a UTI.  However patient's symptoms did start  fairly abruptly and also concern for possible renal stone.  Foley catheter will be placed to decompress the bladder.  Labs are pending.  11:39 AM Catheter was placed with pure blood returned with multiple clots approximately 400 mL out but bag is starting to clot.  A  three-way catheter had been placed and will irrigate the bladder.  Patient last took a dose of Eliquis last night.  I independently interpreted patient's labs and CBC with mild leukocytosis of 11, hemoglobin stable at 11, BMP with new AKI with a creatinine of 4.49 from her baseline of 2.5 with hyponatremia of 128 and new hyperkalemia of 6.2.  Will repeat potassium to ensure that this is correct.  Also will get an EKG and renal stone study pending.  After catheter was placed patient reports she does feel more comfortable.  Even with a liter of irrigation patient is still having grossly bloody urine.  Urine without significant findings for infection.  She had a cystoscopy last year for gross hematuria in the setting of a UTI but no specific lesions were found.  Will discuss with urology have them consult.  Patient will require admission.  11:41 AM I have independently visualized and interpreted pt's images today.  Renal study with bilateral hydronephrosis and persistent clot in the bladder.  With irrigation patient is not having much output and concern for ongoing retention.  Talked with urology who is going to come and see the patient but they requested that she have a larger catheter placed between a 22 and a 24 [place] so that more clot can be evacuated.  Will consult the hospitalist for admission.  Repeat potassium was improved at 5.6.  I independently interpreted patient's EKG and no signs of peaked T waves at this time with a persistently paced rhythm.  CRITICAL CARE Performed by: Shawnna Pancake Total critical care time: 30 minutes Critical care time was exclusive of separately billable procedures and treating other patients. Critical care was necessary to treat or prevent imminent or life-threatening deterioration. Critical care was time spent personally by me on the following activities: development of treatment plan with patient and/or surrogate as well as nursing, discussions with consultants,  evaluation of patient's response to treatment, examination of patient, obtaining history from patient or surrogate, ordering and performing treatments and interventions, ordering and review of laboratory studies, ordering and review of radiographic studies, pulse oximetry and re-evaluation of patient's condition.           Final Clinical Impression(s) / ED Diagnoses Final diagnoses:  None    Rx / DC Orders ED Discharge Orders     None         Gwyneth Sprout, MD 10/26/23 1143

## 2023-10-26 NOTE — Consult Note (Signed)
 Urology Consult Note   Requesting Attending Physician:  Clydie Braun, MD Service Providing Consult: Urology  Consulting Attending: Dr. Lafonda Mosses   Reason for Consult: Hematuria  HPI: Elaine [name] is seen in consultation for reasons noted above at the request of Clydie Braun, MD. [name] presents to [facility] today via EMS for shortness of breath, abdominal discomfort, urinary retention, and hematuria.  PMH significant for asthma, CHF, CKD-now on dialysis, ischemic cardiomyopathy, s/p CABG, s/p AICD, atrial fibrillation, chronic anticoagulation-Eliquis, and T2DM.  Patient recently had an episode of spontaneous hematuria and was followed by the [facility].  She had unremarkable cystoscopy, urine culture, and urine cytology at that time.  On my arrival patient was alert, oriented, no distress.  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  Her son was also present.  Case and plan reviewed and all questions were answered to their satisfaction.  ------------------  Assessment:  83 y.o. female with hematuria in the context of chronic anticoagulation.   Recommendations: # Gross hematuria # Chronic anticoagulation  73f three-way hematuria catheter in place.  Smaller than would be preferred but serviceable at this time.  Shared decision made to keep in place rather than having patient endure a second Foley placement. Nursing to hand irrigate as needed CBI on medium gtt. Eliquis on hold.  They understand there is some inherent risk in stopping this prophylaxis.  I am concerned that her acute blood loss anemia could eventually usher in a cardiac event as well.  This will have to be an ongoing discussion of risk versus benefit between the family, urology, and her cardiologist. Will reevaluate on a daily basis and if conservative measures fail, will proceed to OR with clot evacuation and fulguration. Will follow  Case and plan  discussed with Dr. Lafonda Mosses  Past Medical History: Past Medical History:  Diagnosis Date   Acute myocardial infarction of anterior wall (HCC) 10/20/2013   Afib, LBBB   Acute pulmonary edema with congestive heart failure (HCC) 10/20/2013   Resolved   AICD (automatic cardioverter/defibrillator) present    Arthritis    Atrial fibrillation (HCC) 10/20/2013   On Warfarin   CAD, multiple vessel 10/20/2013   LM, LAD & RCA --> CABG; MYOVIEW 12/'15: Intermediate Risk would large anterior, anteroapical segment the apex possible infarct and peri-infarct ischemia   CHF (congestive heart failure) (HCC)    EF 40-45%.   Chronic kidney disease    COPD (chronic obstructive pulmonary disease) (HCC)    Diabetic neuropathy (HCC)    Heart murmur    Likely related to aortic sclerosis   Hyperlipidemia    Hypertension    Hypothyroidism    Ischemic cardiomyopathy - Notable Improvement in EF post CABG 10/20/2013   Echo 2/23: EF 20-25%; mild LVH. anteroseptal Akinesis; mid-apical anterior, inferior & inferoseptal + apical-lateral akinesis; G 1 DDysfxn.; Mild-Mod LA dil; mild MR; Mod PHTN;; F/u Echo 12/2013: EF 40-45%, septal & apical HK, mild LVH; Mod LA dilation   Left bundle branch block (LBBB) on electrocardiogram    Mild mitral regurgitation by prior echocardiogram    Mild-Mod MR on Echo   Morbid obesity (HCC)    OSTEOARTHRITIS 08/06/2006   S/P CABG x 3 10/23/2013   LIMA to LAD, SVG to OM2, SVG to RPLB, EVH via right thigh   Type II diabetes mellitus with complication (HCC) 08/06/2006   off rx since heart attack 12/15-dr told could stay  off if keep cbg under 150   UTI (lower urinary tract infection) 09/29/2014   ongoing now. started rx 09/28/14    Past Surgical History:  Past Surgical History:  Procedure Laterality Date   ABDOMINAL HYSTERECTOMY     AV NODE ABLATION N/A 05/19/2020   Procedure: AV NODE ABLATION;  Surgeon: Regan Lemming, MD;  Location: [facility] INVASIVE CV LAB;  Service:  Cardiovascular;  Laterality: N/A;   BIV ICD INSERTION CRT-D N/A 07/28/2019   Procedure: BIV ICD INSERTION CRT-D;  Surgeon: Regan Lemming, MD;  Location: [facility] INVASIVE CV LAB;  Service: Cardiovascular;  Laterality: N/A;   BREAST BIOPSY Right 08/04/2016    fibroadenoma with calcifications    BREAST BIOPSY Right 07/16/2014   fibroadenoma with calcifications and atypical   BREAST EXCISIONAL BIOPSY Right 10/06/2014   atypical lobular hyperplasia    BREAST LUMPECTOMY W/ NEEDLE LOCALIZATION Right 10/06/2014   Dr Corliss Skains   BREAST LUMPECTOMY WITH NEEDLE LOCALIZATION Right 10/06/2014   Procedure: RIGHT BREAST LUMPECTOMY WITH NEEDLE LOCALIZATION;  Surgeon: Manus Rudd, MD;  Location: [facility] OR;  Service: General;  Laterality: Right;   CARDIOVERSION N/A 10/22/2019   Procedure: CARDIOVERSION;  Surgeon: Laurey Morale, MD;  Location: [facility] ENDOSCOPY;  Service: Cardiovascular;  Laterality: N/A;   CARPAL TUNNEL RELEASE Right 04/13/2022   Procedure: RIGHT OPEN CARPAL TUNNEL RELEASE;  Surgeon: Kathryne Hitch, MD;  Location: [facility] OR;  Service: Orthopedics;  Laterality: Right;   CLIPPING OF ATRIAL APPENDAGE N/A 10/23/2013   Procedure: CLIPPING OF ATRIAL APPENDAGE;  Surgeon: Purcell Nails, MD;  Location: [facility] OR;  Service: Open Heart Surgery;  Laterality: N/A;   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   CORONARY ARTERY BYPASS GRAFT N/A 10/23/2013   Procedure: CORONARY ARTERY BYPASS GRAFTING (CABG);  Surgeon: Purcell Nails, MD;  Location: [facility] OR;  Service: Open Heart Surgery: LIMA-LAD, SVG-OM2, SVG-RPL   INTRAOPERATIVE TRANSESOPHAGEAL ECHOCARDIOGRAM N/A 10/23/2013   Procedure: INTRAOPERATIVE TRANSESOPHAGEAL ECHOCARDIOGRAM;  Surgeon: Purcell Nails, MD;  Location: [facility] OR;  Service: Open Heart Surgery;  Laterality: N/A;   LEFT HEART CATHETERIZATION WITH CORONARY ANGIOGRAM N/A 10/20/2013   Procedure: LEFT HEART CATHETERIZATION WITH CORONARY ANGIOGRAM;  Surgeon: Marykay Lex, MD;  Location: [facility] CATH LAB;  Service:  Cardiovascular: Distal LM ~70%, LAD - ostial 70%, prox 80, 95 & 95%; RCA prox 70%, mid 80%; minimal Cx disease   LEFT HEART CATHETERIZATION WITH CORONARY/GRAFT ANGIOGRAM N/A 09/08/2014   Procedure: LEFT HEART CATHETERIZATION WITH Isabel Caprice;  Surgeon: Marykay Lex, MD;  Location: [facility] CATH LAB: Indication: Abnormal Myoview. Occluded LAD after 70 and 80% left main. Diffuse RCA disease. Widely patent grafts to Moderately diseased artery vessels. Mild-moderate LV dysfunction and chronic A. fib.   NM MYOVIEW LTD  08/14/2014   Lexi scan: INTERMEDIATE RISK - large, severe intensity perfusion defect in anterior wall, apex and inferior apex that is partly reversible. This suggests either scar or possible hibernating myocardium. Nondeviated.-> Likely scar. No change in coronary anatomy with patent grafts.   TEE WITHOUT CARDIOVERSION N/A 06/06/2022   Procedure: TRANSESOPHAGEAL ECHOCARDIOGRAM (TEE);  Surgeon: Laurey Morale, MD;  Location: [facility] ENDOSCOPY;  Service: Cardiovascular;  Laterality: N/A;   TOTAL HIP ARTHROPLASTY  2010, 2011   right 2010, left 2011   TRANSTHORACIC ECHOCARDIOGRAM  12/2013   Mildly dilated left ventricle with mild to moderately reduced function. EF 40-45% (notable improvement from February 2015). Septal and apical hypokinesis. Mild LA dilation.   TRANSTHORACIC ECHOCARDIOGRAM  11/11/2019  EF 20% with severely dysfunction. Global HK. Unable to determine diastolic pressures. Mild RV function-with mildly elevated PAP-RAP estimated 15 mmHg.. Moderate LA dilation, mild RA dilation.    Medication: Current Facility-Administered Medications  Medication Dose Route Frequency Provider Last Rate Last Admin   0.9 %  sodium chloride infusion (Manually program via Guardrails IV Fluids)   Intravenous Once Katrinka Blazing, Rondell A, MD       sodium chloride flush (NS) 0.9 % injection 3 mL  3 mL Intravenous Q12H Smith, Rondell A, MD       sodium zirconium cyclosilicate (LOKELMA) packet 5 g  5 g  Oral Daily Clydie Braun, MD       Current Outpatient Medications  Medication Sig Dispense Refill   acetaminophen (TYLENOL) 650 MG CR tablet Take 1,300 mg by mouth 2 (two) times daily.     albuterol (VENTOLIN HFA) 108 (90 Base) MCG/ACT inhaler Inhale 2 puffs into the lungs every 6 (six) hours as needed for wheezing or shortness of breath.     amoxicillin (AMOXIL) 500 MG capsule Take 1 capsule (500 mg total) by mouth 2 (two) times daily. 60 capsule 11   apixaban (ELIQUIS) 2.5 MG TABS tablet Take 2.5 mg by mouth 2 (two) times daily.     Coenzyme Q10 (COQ-10) 100 MG CAPS Take 100 mg by mouth 2 (two) times daily.     colchicine 0.6 MG tablet Take 0.6 mg by mouth daily as needed (Gout).      Cranberry 250 MG TABS Take 500 mg by mouth 2 (two) times daily.     Dulaglutide 1.5 MG/0.5ML SOAJ Inject 1.5 mg into the skin once a week. Friday     ELDERBERRY PO Take 1 tablet by mouth 2 (two) times daily.     ezetimibe (ZETIA) 10 MG tablet Take 10 mg by mouth daily.     ferrous sulfate 325 (65 FE) MG tablet Take 325 mg by mouth every Monday, Wednesday, and Friday.     isosorbide-hydrALAZINE (BIDIL) 20-37.5 MG tablet TAKE 1/2 (ONE-HALF) TABLET BY MOUTH THREE TIMES DAILY 135 tablet 0   levothyroxine (SYNTHROID) 112 MCG tablet Take 1 tablet (112 mcg total) by mouth daily before breakfast. 30 tablet 12   metoprolol succinate (TOPROL-XL) 50 MG 24 hr tablet Take 1 tablet (50 mg total) by mouth 2 (two) times daily. Take with or immediately following a meal. 180 tablet 2   Multiple Vitamins-Minerals (MULTI FOR HER 50+ PO) Take 1 tablet by mouth daily.     Multiple Vitamins-Minerals (PRESERVISION AREDS 2) CAPS Take 1 capsule by mouth 2 (two) times daily.     potassium chloride SA (KLOR-CON M20) 20 MEQ tablet Take 2 tablets (40 mEq total) by mouth 2 (two) times daily for 2 days, THEN 2 tablets (40 mEq total) daily. (Patient taking differently: Take (1 tablet) by mouth twice daily. ) 64 tablet 3   Propylene  Glycol (SYSTANE BALANCE) 0.6 % SOLN Place 1 drop into both eyes in the morning and at bedtime.     rosuvastatin (CRESTOR) 5 MG tablet Take 5 mg by mouth every Monday, Wednesday, and Friday.     sertraline (ZOLOFT) 100 MG tablet Take 100 mg by mouth daily.     spironolactone (ALDACTONE) 25 MG tablet TAKE 1 TABLET BY MOUTH DAILY 100 tablet 2   torsemide (DEMADEX) 20 MG tablet Take 4 tablets (80 mg total) by mouth 2 (two) times daily. 720 tablet 2   TRELEGY ELLIPTA 100-62.5-25 MCG/ACT AEPB Inhale 1  puff into the lungs daily.     umeclidinium-vilanterol (ANORO ELLIPTA) 62.5-25 MCG/INH AEPB Inhale 1 puff into the lungs daily.     predniSONE (STERAPRED UNI-PAK 21 TAB) 10 MG (21) TBPK tablet Take by mouth daily. (Patient not taking: Reported on 10/26/2023)      Allergies: Allergies  Allergen Reactions   Crestor [Rosuvastatin Calcium] Other (See Comments)    Cramps- still taking    Allopurinol Hives   Cephalosporins Rash   Tape Rash    Adhesive tape    Social History: Social History   Tobacco Use   Smoking status: Former    Current packs/day: 0.00    Average packs/day: 0.5 packs/day for 39.0 years (19.5 ttl pk-yrs)    Types: Cigarettes    Start date: 08/28/1954    Quit date: 08/28/1993    Years since quitting: 30.1   Smokeless tobacco: Never  Vaping Use   Vaping status: Never Used  Substance Use Topics   Alcohol use: No    Comment: quit 95   Drug use: No    Family History Family History  Adopted: Yes  Problem Relation Age of Onset   Other Mother    Hypertension Mother    Colon cancer Neg Hx    Esophageal cancer Neg Hx    Rectal cancer Neg Hx    Stomach cancer Neg Hx     Review of Systems  Genitourinary:  Positive for hematuria.     Objective   Vital signs in last 24 hours: BP 112/73   Pulse 69   Temp 98.6 F (37 C) (Oral)   Resp 10   Wt 97 kg   SpO2 99%   BMI 39.11 kg/m   Physical Exam General: A&O, resting, appropriate HEENT: [place]/AT Pulmonary: Normal work  of breathing Cardiovascular: no cyanosis Abdomen: Soft, NTTP, nondistended GU: 75f three-way hematuria catheter in place draining pink lemonade colored irrigant.  CBI on medium gtt. Neuro: Appropriate, no focal neurological deficits  Most Recent Labs: Lab Results  Component Value Date   WBC 11.5 (H) 10/26/2023   HGB 9.5 (L) 10/26/2023   HCT 28.8 (L) 10/26/2023   PLT 232 10/26/2023    Lab Results  Component Value Date   NA 128 (L) 10/26/2023   K 5.6 (H) 10/26/2023   CL 94 (L) 10/26/2023   CO2 16 (L) 10/26/2023   BUN 102 (H) 10/26/2023   CREATININE 4.49 (H) 10/26/2023   CALCIUM 9.8 10/26/2023   MG 2.0 06/08/2022    Lab Results  Component Value Date   INR 1.5 (H) 11/20/2019   APTT 36 09/03/2014     Urine Culture: @[id number] (laburin,org,[id number])@   IMAGING: CT Renal Stone Study Result Date: 10/26/2023 CLINICAL DATA:  Gross hematuria EXAM: CT ABDOMEN AND PELVIS WITHOUT CONTRAST TECHNIQUE: Multidetector CT imaging of the abdomen and pelvis was performed following the standard protocol without IV contrast. RADIATION DOSE REDUCTION: This exam was performed according to the departmental dose-optimization program which includes automated exposure control, adjustment of the mA and/or kV according to patient size and/or use of iterative reconstruction technique. COMPARISON:  CT 06/03/2022 and older. FINDINGS: Lower chest: Heart is enlarged. Status post median sternotomy. Defibrillator. Mild linear opacity at the bases likely scar or atelectasis. No pleural effusion. Hepatobiliary: On this non IV contrast exam there is no clear space-occupying liver lesion. The gallbladder is dilated. Please correlate with symptoms and follow-up ultrasound when appropriate. Pancreas: Moderate atrophy of the pancreas. Spleen: Normal in size without focal abnormality. Adrenals/Urinary Tract: Mild  thickening of the adrenal glands. Bilateral renal atrophy identified. Small exophytic cyst along the anterior  aspect of the right kidney is stable. There is moderate bilateral renal collecting system dilatation. No renal or ureteral stones. Dilatation extends down to the bladder. The bladder is dilated. There is a Foley catheter with luminal air. There is a large amount of high attenuation material in the bladder. This could represent hematoma with the patient's history of hematuria. This would obscure underlying mass lesion. Adjacent stranding and some wall thickening as well. Stomach/Bowel: On this non oral contrast exam large bowel has a normal course and caliber with scattered colonic stool. Extensive colonic diverticula. The stomach and small bowel are nondilated as well. Vascular/Lymphatic: Extensive vascular calcifications along the aorta including along branch vessels such as the SMA, celiac. Please correlate with any symptoms. No discrete abnormal lymph node enlargement identified in the abdomen and pelvis. Reproductive: Status post hysterectomy. No adnexal masses. Other: No free air or free fluid. Musculoskeletal: Moderate degenerative changes of the spine and pelvis. Osteopenia. Portions of the pelvis are obscured by the significant streak artifact related to the patient's bowel hip arthroplasties. IMPRESSION: Bilateral renal atrophy.  No obstructing stones. Moderate bilateral renal collecting system dilatation. The bladder is distended with wall thickening and stranding. There is also a large amount of high attenuation material within the lumen of the bladder. Based on the appearance and the history this could represent a hematoma although underlying mass lesion is not excluded with the findings and lack of contrast. Recommend overall further evaluation when appropriate of the genitourinary systems when appropriate such as a contrast study or urologic evaluation. Foley catheter. Colonic diverticulosis. Dilated gallbladder. Please correlate with any symptoms and if needed follow up ultrasound as clinically  appropriate. Enlarged heart.  Postop chest.  Defibrillator. Electronically Signed   By: Karen Kays M.D.   On: 10/26/2023 11:57    ------  Elmon Kirschner, NP Pager: [phone]   Please contact the urology consult pager with any further questions/concerns.

## 2023-10-26 NOTE — H&P (Addendum)
 History and Physical    Patient: SHAMELA HAYDON ZOX:[id number] DOB: 07/21/1941 DOA: 10/26/2023 DOS: the patient was seen and examined on 10/26/2023 PCP: Cleatis Polka., MD  Patient coming from: Home via EMS  Chief Complaint:  Chief Complaint  Patient presents with   Hematuria   HPI: Elaine [name] is a 83 y.o. female with medical history significant of CAD, paroxysmal atrial fibrillation on Eliquis, ischemic cardiomyopathy s/p CABG and AICD, heart failure with reduced EF, COPD, hypothyroidism, diabetes mellitus type 2, CKD stage IV who presents with inability to urinate and bleeding. She is accompanied by her daughter and husband, who are involved in her care decisions.  She has been unable to urinate since 2:00 AM on the day of admission, experiencing severe lower abdominal pain described as 'like a bolt of lightning' when attempting to urinate. She eventually noticed a small amount of bloody urine. Her daughter found a large blood clot and a significant amount of blood in her disposable underwear, prompting a call to emergency services. She was subsequently brought to the hospital by EMS.  She has a history of extreme incontinence and uses disposable underwear. Recently, she experienced a drop in blood counts and was informed about the presence of blood clots, which were flushed out.  She has a clear preference against dialysis, influenced by her father's experiences with the treatment. She is a DNR and has expressed a strong desire not to undergo dialysis if it becomes necessary.  In the emergency department.  Patient was noted to be afebrile with respirations 16-27, blood pressures 105/59 to 158/66, and O2 saturations maintained on room air.  Labs significant for WBC 11.5, hemoglobin 11.3, sodium 128, potassium 6.2, CO2 16, BUN 102, and creatinine 4.49.  Urinalysis was red in color with rare bacteria, greater than 50 RBCs, and 6-10 WBCs.  Urology was consulted and were able to place Foley  catheter with three-way irrigation.  Review of Systems: As mentioned in the history of present illness. All other systems reviewed and are negative. Past Medical History:  Diagnosis Date   Acute myocardial infarction of anterior wall (HCC) 10/20/2013   Afib, LBBB   Acute pulmonary edema with congestive heart failure (HCC) 10/20/2013   Resolved   AICD (automatic cardioverter/defibrillator) present    Arthritis    Atrial fibrillation (HCC) 10/20/2013   On Warfarin   CAD, multiple vessel 10/20/2013   LM, LAD & RCA --> CABG; MYOVIEW 12/'15: Intermediate Risk would large anterior, anteroapical segment the apex possible infarct and peri-infarct ischemia   CHF (congestive heart failure) (HCC)    EF 40-45%.   Chronic kidney disease    COPD (chronic obstructive pulmonary disease) (HCC)    Diabetic neuropathy (HCC)    Heart murmur    Likely related to aortic sclerosis   Hyperlipidemia    Hypertension    Hypothyroidism    Ischemic cardiomyopathy - Notable Improvement in EF post CABG 10/20/2013   Echo 2/23: EF 20-25%; mild LVH. anteroseptal Akinesis; mid-apical anterior, inferior & inferoseptal + apical-lateral akinesis; G 1 DDysfxn.; Mild-Mod LA dil; mild MR; Mod PHTN;; F/u Echo 12/2013: EF 40-45%, septal & apical HK, mild LVH; Mod LA dilation   Left bundle branch block (LBBB) on electrocardiogram    Mild mitral regurgitation by prior echocardiogram    Mild-Mod MR on Echo   Morbid obesity (HCC)    OSTEOARTHRITIS 08/06/2006   S/P CABG x 3 10/23/2013   LIMA to LAD, SVG to OM2, SVG to RPLB, EVH  via right thigh   Type II diabetes mellitus with complication (HCC) 08/06/2006   off rx since heart attack 12/15-dr told could stay off if keep cbg under 150   UTI (lower urinary tract infection) 09/29/2014   ongoing now. started rx 09/28/14   Past Surgical History:  Procedure Laterality Date   ABDOMINAL HYSTERECTOMY     AV NODE ABLATION N/A 05/19/2020   Procedure: AV NODE ABLATION;  Surgeon: Regan Lemming, MD;  Location: [facility] INVASIVE CV LAB;  Service: Cardiovascular;  Laterality: N/A;   BIV ICD INSERTION CRT-D N/A 07/28/2019   Procedure: BIV ICD INSERTION CRT-D;  Surgeon: Regan Lemming, MD;  Location: [facility] INVASIVE CV LAB;  Service: Cardiovascular;  Laterality: N/A;   BREAST BIOPSY Right 08/04/2016    fibroadenoma with calcifications    BREAST BIOPSY Right 07/16/2014   fibroadenoma with calcifications and atypical   BREAST EXCISIONAL BIOPSY Right 10/06/2014   atypical lobular hyperplasia    BREAST LUMPECTOMY W/ NEEDLE LOCALIZATION Right 10/06/2014   Dr Corliss Skains   BREAST LUMPECTOMY WITH NEEDLE LOCALIZATION Right 10/06/2014   Procedure: RIGHT BREAST LUMPECTOMY WITH NEEDLE LOCALIZATION;  Surgeon: Manus Rudd, MD;  Location: [facility] OR;  Service: General;  Laterality: Right;   CARDIOVERSION N/A 10/22/2019   Procedure: CARDIOVERSION;  Surgeon: Laurey Morale, MD;  Location[facility] ENDOSCOPY;  Service: Cardiovascular;  Laterality: N/A;   CARPAL TUNNEL RELEASE Right 04/13/2022   Procedure: RIGHT OPEN CARPAL TUNNEL RELEASE;  Surgeon: Kathryne Hitch, MD;  Location: [facility] OR;  Service: Orthopedics;  Laterality: Right;   CLIPPING OF ATRIAL APPENDAGE N/A 10/23/2013   Procedure: CLIPPING OF ATRIAL APPENDAGE;  Surgeon: Purcell Nails, MD;  Location: [facility] OR;  Service: Open Heart Surgery;  Laterality: N/A;   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   CORONARY ARTERY BYPASS GRAFT N/A 10/23/2013   Procedure: CORONARY ARTERY BYPASS GRAFTING (CABG);  Surgeon: Purcell Nails, MD;  Location: [facility] OR;  Service: Open Heart Surgery: LIMA-LAD, SVG-OM2, SVG-RPL   INTRAOPERATIVE TRANSESOPHAGEAL ECHOCARDIOGRAM N/A 10/23/2013   Procedure: INTRAOPERATIVE TRANSESOPHAGEAL ECHOCARDIOGRAM;  Surgeon: Purcell Nails, MD;  Location: [facility] OR;  Service: Open Heart Surgery;  Laterality: N/A;   LEFT HEART CATHETERIZATION WITH CORONARY ANGIOGRAM N/A 10/20/2013   Procedure: LEFT HEART CATHETERIZATION WITH CORONARY ANGIOGRAM;  Surgeon:  Marykay Lex, MD;  Location: [facility] CATH LAB;  Service: Cardiovascular: Distal LM ~70%, LAD - ostial 70%, prox 80, 95 & 95%; RCA prox 70%, mid 80%; minimal Cx disease   LEFT HEART CATHETERIZATION WITH CORONARY/GRAFT ANGIOGRAM N/A 09/08/2014   Procedure: LEFT HEART CATHETERIZATION WITH Isabel Caprice;  Surgeon: Marykay Lex, MD;  Location: [facility] CATH LAB: Indication: Abnormal Myoview. Occluded LAD after 70 and 80% left main. Diffuse RCA disease. Widely patent grafts to Moderately diseased artery vessels. Mild-moderate LV dysfunction and chronic A. fib.   NM MYOVIEW LTD  08/14/2014   Lexi scan: INTERMEDIATE RISK - large, severe intensity perfusion defect in anterior wall, apex and inferior apex that is partly reversible. This suggests either scar or possible hibernating myocardium. Nondeviated.-> Likely scar. No change in coronary anatomy with patent grafts.   TEE WITHOUT CARDIOVERSION N/A 06/06/2022   Procedure: TRANSESOPHAGEAL ECHOCARDIOGRAM (TEE);  Surgeon: Laurey Morale, MD;  Location: [facility] ENDOSCOPY;  Service: Cardiovascular;  Laterality: N/A;   TOTAL HIP ARTHROPLASTY  2010, 2011   right 2010, left 2011   TRANSTHORACIC ECHOCARDIOGRAM  12/2013   Mildly dilated left ventricle with mild to moderately reduced function. EF 40-45% (notable  improvement from February 2015). Septal and apical hypokinesis. Mild LA dilation.   TRANSTHORACIC ECHOCARDIOGRAM  11/11/2019   EF 20% with severely dysfunction. Global HK. Unable to determine diastolic pressures. Mild RV function-with mildly elevated PAP-RAP estimated 15 mmHg.. Moderate LA dilation, mild RA dilation.   Social History:  reports that she quit smoking about 30 years ago. Her smoking use included cigarettes. She started smoking about 69 years ago. She has a 19.5 pack-year smoking history. She has never used smokeless tobacco. She reports that she does not drink alcohol and does not use drugs.  Allergies  Allergen Reactions   Crestor  [Rosuvastatin Calcium] Other (See Comments)    Cramps- still taking    Allopurinol Hives   Cephalosporins Rash   Tape Rash    Adhesive tape    Family History  Adopted: Yes  Problem Relation Age of Onset   Other Mother    Hypertension Mother    Colon cancer Neg Hx    Esophageal cancer Neg Hx    Rectal cancer Neg Hx    Stomach cancer Neg Hx     Prior to Admission medications   Medication Sig Start Date End Date Taking? Authorizing Provider  acetaminophen (TYLENOL) 650 MG CR tablet Take 1,300 mg by mouth 2 (two) times daily.   Yes [provider]  albuterol (VENTOLIN HFA) 108 (90 Base) MCG/ACT inhaler Inhale 2 puffs into the lungs every 6 (six) hours as needed for wheezing or shortness of breath.   Yes [provider]  amoxicillin (AMOXIL) 500 MG capsule Take 1 capsule (500 mg total) by mouth 2 (two) times daily. 10/04/23 09/28/24 Yes Vu, Tonita Phoenix, MD  apixaban (ELIQUIS) 2.5 MG TABS tablet Take 2.5 mg by mouth 2 (two) times daily.   Yes [provider]  Coenzyme Q10 (COQ-10) 100 MG CAPS Take 100 mg by mouth 2 (two) times daily.   Yes [provider]  colchicine 0.6 MG tablet Take 0.6 mg by mouth daily as needed (Gout).    Yes [provider]  Cranberry 250 MG TABS Take 500 mg by mouth 2 (two) times daily.   Yes [provider]  Dulaglutide 1.5 MG/0.5ML SOAJ Inject 1.5 mg into the skin once a week. Friday   Yes [provider]  ELDERBERRY PO Take 1 tablet by mouth 2 (two) times daily.   Yes [provider]  ezetimibe (ZETIA) 10 MG tablet Take 10 mg by mouth daily.   Yes [provider]  ferrous sulfate 325 (65 FE) MG tablet Take 325 mg by mouth every Monday, Wednesday, and Friday.   Yes [provider]  isosorbide-hydrALAZINE (BIDIL) 20-37.5 MG tablet TAKE 1/2 (ONE-HALF) TABLET BY MOUTH THREE TIMES DAILY 06/06/23  Yes Laurey Morale, MD  levothyroxine (SYNTHROID) 112 MCG tablet Take 1 tablet (112 mcg  total) by mouth daily before breakfast. 02/05/14  Yes Garlan Fillers, MD  metoprolol succinate (TOPROL-XL) 50 MG 24 hr tablet Take 1 tablet (50 mg total) by mouth 2 (two) times daily. Take with or immediately following a meal. 04/20/23  Yes Laurey Morale, MD  Multiple Vitamins-Minerals (MULTI FOR HER 50+ PO) Take 1 tablet by mouth daily.   Yes [provider]  Multiple Vitamins-Minerals (PRESERVISION AREDS 2) CAPS Take 1 capsule by mouth 2 (two) times daily.   Yes [provider]  potassium chloride SA (KLOR-CON M20) 20 MEQ tablet Take 2 tablets (40 mEq total) by mouth 2 (two) times daily for 2 days,  THEN 2 tablets (40 mEq total) daily. Patient taking differently: Take (1 tablet) by mouth twice daily.  07/23/23 07/24/24 Yes Milford, Anderson Malta, FNP  Propylene Glycol (SYSTANE BALANCE) 0.6 % SOLN Place 1 drop into both eyes in the morning and at bedtime.   Yes [provider]  rosuvastatin (CRESTOR) 5 MG tablet Take 5 mg by mouth every Monday, Wednesday, and Friday.   Yes [provider]  sertraline (ZOLOFT) 100 MG tablet Take 100 mg by mouth daily. 10/10/23  Yes [provider]  spironolactone (ALDACTONE) 25 MG tablet TAKE 1 TABLET BY MOUTH DAILY 10/04/23  Yes Laurey Morale, MD  torsemide (DEMADEX) 20 MG tablet Take 4 tablets (80 mg total) by mouth 2 (two) times daily. 07/16/23 10/26/23 Yes Milford, Anderson Malta, FNP  TRELEGY ELLIPTA 100-62.5-25 MCG/ACT AEPB Inhale 1 puff into the lungs daily. 10/14/23  Yes [provider]  umeclidinium-vilanterol (ANORO ELLIPTA) 62.5-25 MCG/INH AEPB Inhale 1 puff into the lungs daily.   Yes [provider]  predniSONE (STERAPRED UNI-PAK 21 TAB) 10 MG (21) TBPK tablet Take by mouth daily. Patient not taking: Reported on 10/26/2023    [provider]    Physical Exam: Vitals:   10/26/23 0930 10/26/23 1000 10/26/23 1130 10/26/23 1214  BP: 128/65 (!) 155/128 (!) 158/66   Pulse: 73 74 73    Resp: 16  (!) 27   Temp:    98.6 F (37 C)  TempSrc:    Oral  SpO2: 99% 100% 100%   Weight:       Constitutional: Obese elderly female who appears to be in some discomfort Eyes: Opacification of the right eye.  Normal-appearing left eye. ENMT: Mucous membranes are moist. Posterior pharynx clear of any exudate or lesions.Normal dentition.  Neck: normal, supple,  Respiratory: clear to auscultation bilaterally, no wheezing, no crackles. Normal respiratory effort. No accessory muscle use.  Cardiovascular: Regular rate and rhythm, positive systolic murmur present.  No significant lower extremity edema present.   Abdomen: Mild tenderness to palpation of the lower abdomen.  Foley catheter with 3 way bladder irrigation draining bloody urine. Musculoskeletal: no clubbing / cyanosis. No joint deformity upper and lower extremities. Good ROM, no contractures. Normal muscle tone.  Skin: no rashes, lesions, ulcers. No induration Neurologic: CN 2-12 grossly intact. Sensation intact, DTR normal. Strength 5/5 in all 4.  Psychiatric: Normal judgment and insight. Alert and oriented x 3. Normal mood.   Data Reviewed:  EKG reveals sinus rhythm at 73 bpm with right bundle branch block and QTc 553.  Assessment and Plan:  Hematuria with bladder outlet obstruction Acute kidney injury superimposed on chronic kidney disease stage IV Possible urinary tract infection Patient reports having hematuria with urinary retention.  Patient reported having significant lower suprapubic tenderness.  Creatinine 4.49 with BUN 102.  Baseline creatinine previously noted to be 2.6 when checked on 2/6.  Urology had to be consulted and placed Foley catheter with three-way irrigation.  Last dose of Eliquis was yesterday evening.  Urinalysis was noted be bloody with rare bacteria, greater than 50 RBC/hpf, and 6-10 WBC. -Admit to a telemetry bed -Strict I&O -Check urine culture -Continue three-way bladder irrigation -Empiric  antibiotics of Rocephin IV -Normal saline IV fluids at 75 mL/h x 1 L -Continue to monitor kidney function daily -Appreciate urology consultative services we will follow-up for any further recommendations  Hypotension Acute.  Patient was noted to have a drop in blood pressure down to 80/46. -Hold home blood pressure  regimen at this time  Acute blood loss anemia On admission hemoglobin 11.3.  Thought secondary to hematuria.  Her baseline hemoglobin previously noted to be around 12.7.  Patient was typed and screened for possible need of blood products. -Goal hemoglobin greater than 8 g/dL. -Recheck H&H (9.5) transfuse 1 unit of packed red blood cells due to significant drop  -Continue to monitor and address blood pressure as deemed medically appropriate  Hyperkalemia Hyponatremia Acute.  Initial potassium noted to be 6.2 with repeat check 5.6 and sodium was noted to be 128. -Continue Lokelma 5 g p.o.  X 1 dose -Follow-up BNP -Gentle IV fluids  -Recheck BMP in a.m.  Paroxysmal atrial fibrillation on chronic anticoagulation S/p pacemaker Patient appears to be in a sinus rhythm at this time.  Last dose of Eliquis was reported yesterday evening. CHA2DS2-VASc score equal to 6.   -Hold Eliquis -Goal potassium at least 4 and magnesium at least 2  Ischemic cardiomyopathy s/p CABG 2015 and AICD Heart failure with reduced ejection fraction Patient does not appear to be acutely fluid overloaded at this time.  Per HPI last echocardiogram noted EF 25 to 30% with all apical segments akinetic with dyskinesia of the LV apex when checked 11/01/2022. -Strict I&Os and daily weights -Add on BNP -Holding diuretics at this time -Continue statin  COPD, without exacerbation Patient does not appear to be in acute exacerbation. -Continue home inhaler -Albuterol nebs as needed for shortness of breath/wheezing  Controlled diabetes mellitus type 2, without long-term use of insulin On admission glucose  elevated up to 224.  Last available hemoglobin A1c was noted to be 6.4 when checked back in 05/2022. -Hypoglycemic protocols -Add-on hemoglobin A1c -CBGs before every meal with very sensitive SSI -Adjust insulin regimen as needed  Hypothyroidism -Continue levothyroxine  Hyperlipidemia -Continue Crestor  Obesity BMI 39.11 kg/m.  DVT prophylaxis: SCDs Advance Care Planning:   Code Status: Do not attempt resuscitation (DNR) PRE-ARREST INTERVENTIONS DESIRED   Consults: Urology  Family Communication: Daughter and husband updated at bedside  Severity of Illness: The appropriate patient status for this patient is INPATIENT. Inpatient status is judged to be reasonable and necessary in order to provide the required intensity of service to ensure the patient's safety. The patient's presenting symptoms, physical exam findings, and initial radiographic and laboratory data in the context of their chronic comorbidities is felt to place them at high risk for further clinical deterioration. Furthermore, it is not anticipated that the patient will be medically stable for discharge from the hospital within 2 midnights of admission.   * I certify that at the point of admission it is my clinical judgment that the patient will require inpatient hospital care spanning beyond 2 midnights from the point of admission due to high intensity of service, high risk for further deterioration and high frequency of surveillance required.*  Author: Clydie Braun, MD 10/26/2023 12:29 PM  For on call review [email or web address].

## 2023-10-26 NOTE — ED Triage Notes (Addendum)
 Pt presents via EMS from home for SOB which improved prior to arrival with home inhaler. Pt would also like to be seen for urinary retention and vaginal bleeding since yesterday. Endorses lower abd pain, emesis x 1 yesterday, dysuria Last urinary output yesterday per pt (though she describes what sounds like "bursts" of overflow, EMS noted bloody incontinence on stretcher sheet)  H/o Asthma, CHF, CKD for which she has declined to begin dialysis  EMS exam: Rales R lower, 120SBP, 77, 99% RA, 237CBG   In triage, bladder scan x3 by tech and this RN reveal 0 cc; however, lower abd tender and appears blood and urine in depend. Communicated need for room to [facility] and upgraded acuity

## 2023-10-26 NOTE — ED Notes (Signed)
 Chux pad changed under pt. Repositioned in bed. Lights dimmed for comfort. Call light within reach.

## 2023-10-26 NOTE — ED Notes (Signed)
Urology cart at bedside 

## 2023-10-26 NOTE — ED Notes (Signed)
 Pt transported to CT ?

## 2023-10-27 ENCOUNTER — Inpatient Hospital Stay (HOSPITAL_COMMUNITY)

## 2023-10-27 DIAGNOSIS — R310 Gross hematuria: Secondary | ICD-10-CM

## 2023-10-27 LAB — BASIC METABOLIC PANEL
Anion gap: 20 — ABNORMAL HIGH (ref 5–15)
BUN: 99 mg/dL — ABNORMAL HIGH (ref 8–23)
CO2: 16 mmol/L — ABNORMAL LOW (ref 22–32)
Calcium: 9.3 mg/dL (ref 8.9–10.3)
Chloride: 92 mmol/L — ABNORMAL LOW (ref 98–111)
Creatinine, Ser: 4.1 mg/dL — ABNORMAL HIGH (ref 0.44–1.00)
GFR, Estimated: 10 mL/min — ABNORMAL LOW (ref >60)
Glucose, Bld: 188 mg/dL — ABNORMAL HIGH (ref 70–99)
Potassium: 4.4 mmol/L (ref 3.5–5.1)
Sodium: 128 mmol/L — ABNORMAL LOW (ref 135–145)

## 2023-10-27 LAB — CBC
HCT: 27.1 % — ABNORMAL LOW (ref 36.0–46.0)
Hemoglobin: 9.4 g/dL — ABNORMAL LOW (ref 12.0–15.0)
MCH: 30.1 pg (ref 26.0–34.0)
MCHC: 34.7 g/dL (ref 30.0–36.0)
MCV: 86.9 fL (ref 80.0–100.0)
Platelets: 177 10*3/uL (ref 150–400)
RBC: 3.12 MIL/uL — ABNORMAL LOW (ref 3.87–5.11)
RDW: 16 % — ABNORMAL HIGH (ref 11.5–15.5)
WBC: 9.1 10*3/uL (ref 4.0–10.5)
nRBC: 0 % (ref 0.0–0.2)

## 2023-10-27 LAB — BPAM RBC
Blood Product Expiration Date: 202503242359
ISSUE DATE / TIME: 202502281735
Unit Type and Rh: 6200

## 2023-10-27 LAB — TYPE AND SCREEN
ABO/RH(D): A POS
Antibody Screen: NEGATIVE
Unit division: 0

## 2023-10-27 LAB — GLUCOSE, CAPILLARY
Glucose-Capillary: 144 mg/dL — ABNORMAL HIGH (ref 70–99)
Glucose-Capillary: 194 mg/dL — ABNORMAL HIGH (ref 70–99)
Glucose-Capillary: 167 mg/dL — ABNORMAL HIGH (ref 70–99)
Glucose-Capillary: 151 mg/dL — ABNORMAL HIGH (ref 70–99)

## 2023-10-27 MED ORDER — SODIUM CHLORIDE 0.9 % IV SOLN
12.5000 mg | Freq: Once | INTRAVENOUS | Status: AC
  Administered 2023-10-27: 12.5 mg via INTRAVENOUS
  Filled 2023-10-27: qty 12.5

## 2023-10-27 MED ORDER — ONDANSETRON HCL 4 MG/2ML IJ SOLN
4.0000 mg | Freq: Four times a day (QID) | INTRAMUSCULAR | Status: DC | PRN
  Administered 2023-10-27: 4 mg via INTRAVENOUS
  Filled 2023-10-27: qty 2

## 2023-10-27 MED ORDER — SODIUM CHLORIDE 0.9 % IV SOLN: INTRAVENOUS | Status: AC

## 2023-10-27 NOTE — Progress Notes (Signed)
 PROGRESS NOTE    Elaine [name]  WUJ:[id number] DOB: 1941-02-02 DOA: 10/26/2023 PCP: Cleatis Polka., MD   83 y.o. female with medical history significant of CAD, paroxysmal atrial fibrillation on Eliquis, ischemic cardiomyopathy s/p CABG and AICD, heart failure with reduced EF, COPD, hypothyroidism, diabetes mellitus type 2, CKD stage IV who presented to the ED 2/20 with inability to urinate and hematuria.  Noted urinary retention early a.m. with severe lower abdominal pain,.daughter noted large blood clot and significant amount of blood in her diaper. She has a clear preference against dialysis, influenced by her father's experiences with the treatment. She is a DNR and has expressed a strong desire not to undergo dialysis if it becomes necessary. -In the ED afebrile, vital signs stable, BC 11.5, hemoglobin 11.3, trended down to 9, potassium 6.2, CO2 16, creatinine 4.5. -Urology was consulted and were able to place Foley catheter with three-way irrigation. -Having hematuria with frank clots, requiring continuous bladder irrigation with suctioning  Subjective: -Feels fair, no events overnight  Assessment and Plan:  Gross hematuria with bladder outlet obstruction Acute kidney injury superimposed on chronic kidney disease stage IV Possible urinary tract infection -Persistent hematuria with clots, urology following, continuous bladder irrigation ongoing  -Continue IV ceftriaxone, follow-up urine culture -Eliquis on hold  -Continue IV fluids for today    Hypotension Acute.  Patient was noted to have a drop in blood pressure down to 80/46. -Now improving, antihypertensives held   Acute blood loss anemia -Hemoglobin dropped from 11.3 to 9  yesterday, she was transfused 1 unit PRBC in the ER -Continue to trend and monitor  Hyperkalemia Hyponatremia Hyperkalemia has resolved, monitor sodium   Paroxysmal atrial fibrillation on chronic anticoagulation S/p pacemaker Patient appears to  be in a sinus rhythm at this time. -CHA2DS2-VASc score equal to 6.   -Hold Eliquis   Ischemic cardiomyopathy s/p CABG 2015 and AICD Chronic systolic CHF -Appears euvolemic to slightly dry -Last echo 3/24 noted EF 25 to 30% with all apical segments akinetic with dyskinesia of the LV apex -Holding diuretics  COPD, without exacerbation Patient does not appear to be in acute exacerbation. -Continue home inhaler -Albuterol nebs as needed for shortness of breath/wheezing   Controlled diabetes mellitus type 2, without long-term use of insulin -A1 C is 7.2, CBGs are stable, SSI   Hypothyroidism -Continue levothyroxine   Hyperlipidemia -Continue Crestor   Obesity BMI 39.11 kg/m.   DVT prophylaxis: SCDs Advance Care Planning:   Code Status: Do not attempt resuscitation (DNR) PRE-ARREST INTERVENTIONS DESIRED    Consults: Urology   Family Communication: No family at bedside Disposition Plan: Home pending improvement  Consultants:    Procedures:   Antimicrobials:    Objective: Vitals:   10/27/23 0600 10/27/23 0700 10/27/23 0815 10/27/23 1130  BP: 112/72 106/70 (!) 118/50 (!) 116/49  Pulse: 73 73 72 73  Resp: 16 16 20 14   Temp:   98.5 F (36.9 C) 97.8 F (36.6 C)  TempSrc:  Oral Oral Oral  SpO2: 96% 95% 99% 97%  Weight:        Intake/Output Summary (Last 24 hours) at 10/27/2023 1146 Last data filed at 10/27/2023 1133 Gross per 24 hour  Intake 25650.84 ml  Output 95621 ml  Net -5999.16 ml   Filed Weights   10/26/23 0709 10/27/23 0441  Weight: 97 kg 92.6 kg    Examination:  General exam: Obese chronically ill female laying in bed, AAOx3 HEENT: Neck obese unable to assess JVD CVS: S1-S2,  regular rhythm Lungs: Decreased breath sounds at the bases Abdomen: Soft, nontender, bowel sounds present Extremities: Trace edema, chronic skin changes  -GU: Foley catheter three-way with hematuria Skin: No rashes Psychiatry:  Mood & affect appropriate.     Data  Reviewed:   CBC: Recent Labs  Lab 10/26/23 0730 10/26/23 1309 10/26/23 2225 10/27/23 0952  WBC 11.5*  --   --  9.1  HGB 11.3* 9.5* 9.8* 9.4*  HCT 34.6* 28.8* 28.3* 27.1*  MCV 90.8  --   --  86.9  PLT 232  --   --  177   Basic Metabolic Panel: Recent Labs  Lab 10/26/23 0730 10/26/23 1029 10/27/23 0952  NA 128*  --  128*  K 6.2* 5.6* 4.4  CL 94*  --  92*  CO2 16*  --  16*  GLUCOSE 224*  --  188*  BUN 102*  --  99*  CREATININE 4.49*  --  4.10*  CALCIUM 9.8  --  9.3   GFR: Estimated Creatinine Clearance: 11 mL/min (A) (by C-G formula based on SCr of 4.1 mg/dL (H)). Liver Function Tests: No results for input(s): "AST", "ALT", "ALKPHOS", "BILITOT", "PROT", "ALBUMIN" in the last 168 hours. No results for input(s): "LIPASE", "AMYLASE" in the last 168 hours. No results for input(s): "AMMONIA" in the last 168 hours. Coagulation Profile: No results for input(s): "INR", "PROTIME" in the last 168 hours. Cardiac Enzymes: No results for input(s): "CKTOTAL", "CKMB", "CKMBINDEX", "TROPONINI" in the last 168 hours. BNP (last 3 results) No results for input(s): "PROBNP" in the last 8760 hours. HbA1C: Recent Labs    10/26/23 2225  HGBA1C 7.2*   CBG: Recent Labs  Lab 10/27/23 0546  GLUCAP 144*   Lipid Profile: No results for input(s): "CHOL", "HDL", "LDLCALC", "TRIG", "CHOLHDL", "LDLDIRECT" in the last 72 hours. Thyroid Function Tests: No results for input(s): "TSH", "T4TOTAL", "FREET4", "T3FREE", "THYROIDAB" in the last 72 hours. Anemia Panel: No results for input(s): "VITAMINB12", "FOLATE", "FERRITIN", "TIBC", "IRON", "RETICCTPCT" in the last 72 hours. Urine analysis:    Component Value Date/Time   COLORURINE RED (A) 10/26/2023 0844   APPEARANCEUR TURBID (A) 10/26/2023 0844   LABSPEC  10/26/2023 0844    TEST NOT REPORTED DUE TO COLOR INTERFERENCE OF URINE PIGMENT   PHURINE  10/26/2023 0844    TEST NOT REPORTED DUE TO COLOR INTERFERENCE OF URINE PIGMENT   GLUCOSEU (A)  10/26/2023 0844    TEST NOT REPORTED DUE TO COLOR INTERFERENCE OF URINE PIGMENT   HGBUR (A) 10/26/2023 0844    TEST NOT REPORTED DUE TO COLOR INTERFERENCE OF URINE PIGMENT   BILIRUBINUR (A) 10/26/2023 0844    TEST NOT REPORTED DUE TO COLOR INTERFERENCE OF URINE PIGMENT   KETONESUR (A) 10/26/2023 0844    TEST NOT REPORTED DUE TO COLOR INTERFERENCE OF URINE PIGMENT   PROTEINUR (A) 10/26/2023 0844    TEST NOT REPORTED DUE TO COLOR INTERFERENCE OF URINE PIGMENT   UROBILINOGEN 0.2 02/02/2014 1943   NITRITE (A) 10/26/2023 0844    TEST NOT REPORTED DUE TO COLOR INTERFERENCE OF URINE PIGMENT   LEUKOCYTESUR (A) 10/26/2023 0844    TEST NOT REPORTED DUE TO COLOR INTERFERENCE OF URINE PIGMENT   Sepsis Labs: @LABRCNTIP (procalcitonin:4,lacticidven:4)  )No results found for this or any previous visit (from the past 240 hours).   Radiology Studies: DG CHEST PORT 1 VIEW Result Date: 10/26/2023 CLINICAL DATA:  Shortness of breath EXAM: PORTABLE CHEST 1 VIEW COMPARISON:  06/03/2022 FINDINGS: Cardiac shadow is enlarged but stable. Postsurgical  changes and defibrillator are again identified and stable. The lungs are well aerated bilaterally. No focal infiltrate or effusion is seen. No bony abnormality is noted. IMPRESSION: No acute abnormality noted. Electronically Signed   By: Alcide Clever M.D.   On: 10/26/2023 21:24   CT Renal Stone Study Result Date: 10/26/2023 CLINICAL DATA:  Gross hematuria EXAM: CT ABDOMEN AND PELVIS WITHOUT CONTRAST TECHNIQUE: Multidetector CT imaging of the abdomen and pelvis was performed following the standard protocol without IV contrast. RADIATION DOSE REDUCTION: This exam was performed according to the departmental dose-optimization program which includes automated exposure control, adjustment of the mA and/or kV according to patient size and/or use of iterative reconstruction technique. COMPARISON:  CT 06/03/2022 and older. FINDINGS: Lower chest: Heart is enlarged. Status post  median sternotomy. Defibrillator. Mild linear opacity at the bases likely scar or atelectasis. No pleural effusion. Hepatobiliary: On this non IV contrast exam there is no clear space-occupying liver lesion. The gallbladder is dilated. Please correlate with symptoms and follow-up ultrasound when appropriate. Pancreas: Moderate atrophy of the pancreas. Spleen: Normal in size without focal abnormality. Adrenals/Urinary Tract: Mild thickening of the adrenal glands. Bilateral renal atrophy identified. Small exophytic cyst along the anterior aspect of the right kidney is stable. There is moderate bilateral renal collecting system dilatation. No renal or ureteral stones. Dilatation extends down to the bladder. The bladder is dilated. There is a Foley catheter with luminal air. There is a large amount of high attenuation material in the bladder. This could represent hematoma with the patient's history of hematuria. This would obscure underlying mass lesion. Adjacent stranding and some wall thickening as well. Stomach/Bowel: On this non oral contrast exam large bowel has a normal course and caliber with scattered colonic stool. Extensive colonic diverticula. The stomach and small bowel are nondilated as well. Vascular/Lymphatic: Extensive vascular calcifications along the aorta including along branch vessels such as the SMA, celiac. Please correlate with any symptoms. No discrete abnormal lymph node enlargement identified in the abdomen and pelvis. Reproductive: Status post hysterectomy. No adnexal masses. Other: No free air or free fluid. Musculoskeletal: Moderate degenerative changes of the spine and pelvis. Osteopenia. Portions of the pelvis are obscured by the significant streak artifact related to the patient's bowel hip arthroplasties. IMPRESSION: Bilateral renal atrophy.  No obstructing stones. Moderate bilateral renal collecting system dilatation. The bladder is distended with wall thickening and stranding. There is  also a large amount of high attenuation material within the lumen of the bladder. Based on the appearance and the history this could represent a hematoma although underlying mass lesion is not excluded with the findings and lack of contrast. Recommend overall further evaluation when appropriate of the genitourinary systems when appropriate such as a contrast study or urologic evaluation. Foley catheter. Colonic diverticulosis. Dilated gallbladder. Please correlate with any symptoms and if needed follow up ultrasound as clinically appropriate. Enlarged heart.  Postop chest.  Defibrillator. Electronically Signed   By: Karen Kays M.D.   On: 10/26/2023 11:57     Scheduled Meds:  ferrous sulfate  325 mg Oral Q M,W,F   insulin aspart  0-6 Units Subcutaneous TID WC   levothyroxine  112 mcg Oral QAC breakfast   polyvinyl alcohol  2 drop Both Eyes BID   rosuvastatin  5 mg Oral Q M,W,F   sertraline  100 mg Oral Daily   sodium chloride flush  3 mL Intravenous Q12H   sodium zirconium cyclosilicate  5 g Oral Daily   umeclidinium-vilanterol  1  puff Inhalation Daily   Continuous Infusions:  cefTRIAXone (ROCEPHIN)  IV Stopped (10/26/23 1646)   sodium chloride irrigation       LOS: 1 day    Time spent:    Zannie Cove, MD [facility]   10/27/2023, 11:46 AM

## 2023-10-27 NOTE — Plan of Care (Signed)

## 2023-10-27 NOTE — Progress Notes (Signed)
 Urology Progress Note   Patient: Elaine [name] [id number] DOB: Oct 06, 1940 DOA: 10/26/2023     1 DOS: the patient was seen and examined on 10/27/2023   Subjective: O/N issues with catheter obstruction requiring hand irrigation.  50 cc clot evacuated from bladder overnight.  This AM, Foley changed to 3-way and seems to be running well, light pink on medium drip.    Physical Exam: Vitals:   10/27/23 0600 10/27/23 0700 10/27/23 0815 10/27/23 1130  BP: 112/72 106/70 (!) 118/50 (!) 116/49  Pulse: 73 73 72 73  Resp: 16 16 20 14   Temp:   98.5 F (36.9 C) 97.8 F (36.6 C)  TempSrc:  Oral Oral Oral  SpO2: 96% 95% 99% 97%  Weight:       A&O x 3 Abd soft, NT Foley draining light pink on medium gtt  Hand irrigated with ~500 with small amt old clot evacuated, approx 25 cc.  CBI resumed.   CBC    Latest Ref Rng & Units 10/27/2023    9:52 AM 10/26/2023   10:25 PM 10/26/2023    1:09 PM  CBC  WBC 4.0 - 10.5 K/uL 9.1     Hemoglobin 12.0 - 15.0 g/dL 9.4  9.8  9.5   Hematocrit 36.0 - 46.0 % 27.1  28.3  28.8   Platelets 150 - 400 K/uL 177      A/P:  Gross hematuria- catheter upsided.  Hbg relatively stable, continue to trend.  Continue to hand irrigate as needed and titrate drip.  Hold Eliquis.  Pelvic ultrasound to assess clot burden.  Make NPO at MN in case she needs cysto / fulguration in am.    Vanna Scotland, MD 10/27/2023 1:14 PM

## 2023-10-28 DIAGNOSIS — R310 Gross hematuria: Secondary | ICD-10-CM | POA: Diagnosis not present

## 2023-10-28 LAB — GLUCOSE, CAPILLARY
Glucose-Capillary: 112 mg/dL — ABNORMAL HIGH (ref 70–99)
Glucose-Capillary: 139 mg/dL — ABNORMAL HIGH (ref 70–99)
Glucose-Capillary: 140 mg/dL — ABNORMAL HIGH (ref 70–99)
Glucose-Capillary: 145 mg/dL — ABNORMAL HIGH (ref 70–99)

## 2023-10-28 LAB — CBC
HCT: 24.9 % — ABNORMAL LOW (ref 36.0–46.0)
Hemoglobin: 8.7 g/dL — ABNORMAL LOW (ref 12.0–15.0)
MCH: 31 pg (ref 26.0–34.0)
MCHC: 34.9 g/dL (ref 30.0–36.0)
MCV: 88.6 fL (ref 80.0–100.0)
Platelets: 174 10*3/uL (ref 150–400)
RBC: 2.81 MIL/uL — ABNORMAL LOW (ref 3.87–5.11)
RDW: 15.7 % — ABNORMAL HIGH (ref 11.5–15.5)
WBC: 8.6 10*3/uL (ref 4.0–10.5)
nRBC: 0 % (ref 0.0–0.2)

## 2023-10-28 LAB — COMPREHENSIVE METABOLIC PANEL
ALT: 16 U/L (ref 0–44)
AST: 24 U/L (ref 15–41)
Albumin: 2.9 g/dL — ABNORMAL LOW (ref 3.5–5.0)
Alkaline Phosphatase: 62 U/L (ref 38–126)
Anion gap: 14 (ref 5–15)
BUN: 92 mg/dL — ABNORMAL HIGH (ref 8–23)
CO2: 19 mmol/L — ABNORMAL LOW (ref 22–32)
Calcium: 8.8 mg/dL — ABNORMAL LOW (ref 8.9–10.3)
Chloride: 96 mmol/L — ABNORMAL LOW (ref 98–111)
Creatinine, Ser: 3.44 mg/dL — ABNORMAL HIGH (ref 0.44–1.00)
GFR, Estimated: 13 mL/min — ABNORMAL LOW (ref >60)
Glucose, Bld: 135 mg/dL — ABNORMAL HIGH (ref 70–99)
Potassium: 3.6 mmol/L (ref 3.5–5.1)
Sodium: 129 mmol/L — ABNORMAL LOW (ref 135–145)
Total Bilirubin: 0.8 mg/dL (ref 0.0–1.2)
Total Protein: 5.6 g/dL — ABNORMAL LOW (ref 6.5–8.1)

## 2023-10-28 MED ORDER — SODIUM CHLORIDE 0.9 % IV SOLN: INTRAVENOUS | Status: DC

## 2023-10-28 NOTE — Progress Notes (Signed)
 PROGRESS NOTE    Elaine [name]  YQM:[id number] DOB: 1941/07/17 DOA: 10/26/2023 PCP: Cleatis Polka., MD   83 y.o. female with medical history significant of CAD, paroxysmal atrial fibrillation on Eliquis, ischemic cardiomyopathy s/p CABG and AICD, heart failure with reduced EF, COPD, hypothyroidism, diabetes mellitus type 2, CKD stage IV who presented to the ED 2/20 with inability to urinate and hematuria.  Noted urinary retention early a.m. with severe lower abdominal pain,.daughter noted large blood clot and significant amount of blood in her diaper. She has a clear preference against dialysis, influenced by her father's experiences with the treatment. She is a DNR and has expressed a strong desire not to undergo dialysis if it becomes necessary. -In the ED afebrile, vital signs stable, BC 11.5, hemoglobin 11.3, trended down to 9, potassium 6.2, CO2 16, creatinine 4.5. -Urology was consulted and were able to place Foley catheter with three-way irrigation. -Having hematuria with frank clots, requiring continuous bladder irrigation with suctioning  Subjective: -Hematuria starting to improve  Assessment and Plan:  Gross hematuria with bladder outlet obstruction Acute kidney injury superimposed on chronic kidney disease stage IV Possible urinary tract infection -Persistent hematuria with clots, urology following, continuous bladder irrigation ongoing, finally starting to improve today -Continue IV ceftriaxone, urine culture with E. coli -Eliquis on hold  -Continue IV fluids for today    Hypotension -  BP remains soft -Now improving, antihypertensives held   Acute blood loss anemia -Hemoglobin slowly trending down, 8.7 today -Transfused 1 unit PRBC on admission, monitor  Hyperkalemia Hyponatremia Hyperkalemia has resolved, monitor sodium   Paroxysmal atrial fibrillation on chronic anticoagulation S/p pacemaker Patient appears to be in a sinus rhythm at this time. -CHA2DS2-VASc  score equal to 6.   -Hold Eliquis   Ischemic cardiomyopathy s/p CABG 2015 and AICD Chronic systolic CHF -Appears euvolemic to slightly dry -Last echo 3/24 noted EF 25 to 30% with all apical segments akinetic with dyskinesia of the LV apex -Holding diuretics  COPD, without exacerbation Patient does not appear to be in acute exacerbation. -Continue home inhaler -Albuterol nebs as needed for shortness of breath/wheezing   Controlled diabetes mellitus type 2, without long-term use of insulin -A1 C is 7.2, CBGs are stable, SSI   Hypothyroidism -Continue levothyroxine   Hyperlipidemia -Continue Crestor   Obesity BMI 39.11 kg/m.   DVT prophylaxis: SCDs Advance Care Planning:   Code Status: Do not attempt resuscitation (DNR) PRE-ARREST INTERVENTIONS DESIRED    Consults: Urology   Family Communication: No family at bedside Disposition Plan: Home pending improvement  Consultants:    Procedures:   Antimicrobials:    Objective: Vitals:   10/28/23 0802 10/28/23 0816 10/28/23 0900 10/28/23 1014  BP:  (!) 109/43 (!) 107/47 (!) 107/50  Pulse:  74 73 72  Resp:  14 17 14   Temp:  98.7 F (37.1 C)    TempSrc:  Oral    SpO2: 95% 95% 98% 97%  Weight:        Intake/Output Summary (Last 24 hours) at 10/28/2023 1121 Last data filed at 10/28/2023 5284 Gross per 24 hour  Intake 31602.76 ml  Output 13244 ml  Net -1997.24 ml   Filed Weights   10/26/23 0709 10/27/23 0441 10/28/23 0400  Weight: 97 kg 92.6 kg 94.1 kg    Examination:  General exam: Obese chronically ill female laying in bed, AAOx3 HEENT: Neck obese unable to assess JVD CVS: S1-S2, regular rhythm Lungs: Decreased breath sounds both bases Abdomen: Soft, nontender, bowel  sounds present Extremities: Trace edema, bilateral chronic skin changes  -GU: Foley catheter three-way with hematuria Skin: No rashes Psychiatry:  Mood & affect appropriate.     Data Reviewed:   CBC: Recent Labs  Lab 10/26/23 0730  10/26/23 1309 10/26/23 2225 10/27/23 0952 10/28/23 0224  WBC 11.5*  --   --  9.1 8.6  HGB 11.3* 9.5* 9.8* 9.4* 8.7*  HCT 34.6* 28.8* 28.3* 27.1* 24.9*  MCV 90.8  --   --  86.9 88.6  PLT 232  --   --  177 174   Basic Metabolic Panel: Recent Labs  Lab 10/26/23 0730 10/26/23 1029 10/27/23 0952 10/28/23 0224  NA 128*  --  128* 129*  K 6.2* 5.6* 4.4 3.6  CL 94*  --  92* 96*  CO2 16*  --  16* 19*  GLUCOSE 224*  --  188* 135*  BUN 102*  --  99* 92*  CREATININE 4.49*  --  4.10* 3.44*  CALCIUM 9.8  --  9.3 8.8*   GFR: Estimated Creatinine Clearance: 13.2 mL/min (A) (by C-G formula based on SCr of 3.44 mg/dL (H)). Liver Function Tests: Recent Labs  Lab 10/28/23 0224  AST 24  ALT 16  ALKPHOS 62  BILITOT 0.8  PROT 5.6*  ALBUMIN 2.9*   No results for input(s): "LIPASE", "AMYLASE" in the last 168 hours. No results for input(s): "AMMONIA" in the last 168 hours. Coagulation Profile: No results for input(s): "INR", "PROTIME" in the last 168 hours. Cardiac Enzymes: No results for input(s): "CKTOTAL", "CKMB", "CKMBINDEX", "TROPONINI" in the last 168 hours. BNP (last 3 results) No results for input(s): "PROBNP" in the last 8760 hours. HbA1C: Recent Labs    10/26/23 2225  HGBA1C 7.2*   CBG: Recent Labs  Lab 10/27/23 0546 10/27/23 1216 10/27/23 1550 10/27/23 2045 10/28/23 0530  GLUCAP 144* 194* 167* 151* 112*   Lipid Profile: No results for input(s): "CHOL", "HDL", "LDLCALC", "TRIG", "CHOLHDL", "LDLDIRECT" in the last 72 hours. Thyroid Function Tests: No results for input(s): "TSH", "T4TOTAL", "FREET4", "T3FREE", "THYROIDAB" in the last 72 hours. Anemia Panel: No results for input(s): "VITAMINB12", "FOLATE", "FERRITIN", "TIBC", "IRON", "RETICCTPCT" in the last 72 hours. Urine analysis:    Component Value Date/Time   COLORURINE RED (A) 10/26/2023 0844   APPEARANCEUR TURBID (A) 10/26/2023 0844   LABSPEC  10/26/2023 0844    TEST NOT REPORTED DUE TO COLOR  INTERFERENCE OF URINE PIGMENT   PHURINE  10/26/2023 0844    TEST NOT REPORTED DUE TO COLOR INTERFERENCE OF URINE PIGMENT   GLUCOSEU (A) 10/26/2023 0844    TEST NOT REPORTED DUE TO COLOR INTERFERENCE OF URINE PIGMENT   HGBUR (A) 10/26/2023 0844    TEST NOT REPORTED DUE TO COLOR INTERFERENCE OF URINE PIGMENT   BILIRUBINUR (A) 10/26/2023 0844    TEST NOT REPORTED DUE TO COLOR INTERFERENCE OF URINE PIGMENT   KETONESUR (A) 10/26/2023 0844    TEST NOT REPORTED DUE TO COLOR INTERFERENCE OF URINE PIGMENT   PROTEINUR (A) 10/26/2023 0844    TEST NOT REPORTED DUE TO COLOR INTERFERENCE OF URINE PIGMENT   UROBILINOGEN 0.2 02/02/2014 1943   NITRITE (A) 10/26/2023 0844    TEST NOT REPORTED DUE TO COLOR INTERFERENCE OF URINE PIGMENT   LEUKOCYTESUR (A) 10/26/2023 0844    TEST NOT REPORTED DUE TO COLOR INTERFERENCE OF URINE PIGMENT   Sepsis Labs: @LABRCNTIP (procalcitonin:4,lacticidven:4)  ) Recent Results (from the past 240 hours)  Urine Culture (for pregnant, neutropenic or urologic patients or patients with  an indwelling urinary catheter)     Status: Abnormal (Preliminary result)   Collection Time: 10/26/23  3:41 PM   Specimen: Urine, Clean Catch  Result Value Ref Range Status   Specimen Description URINE, CLEAN CATCH  Final   Special Requests NONE  Final   Culture (A)  Final    >=100,000 COLONIES/mL ESCHERICHIA COLI SUSCEPTIBILITIES TO FOLLOW CULTURE REINCUBATED FOR BETTER GROWTH Performed at [facility], [address], [place], [place] [address]    Report Status PENDING  Incomplete     Radiology Studies: US PELVIS LIMITED (TRANSABDOMINAL ONLY) Result Date: 10/27/2023 CLINICAL DATA:  Gross hematuria. EXAM: LIMITED ULTRASOUND OF PELVIS TECHNIQUE: Limited transabdominal ultrasound examination of the pelvis was performed. COMPARISON:  CT 10/26/2023 FINDINGS: Foley catheter decompresses the urinary bladder. There is heterogeneous debris surrounding the catheter without vascularity. The  bladder wall is suboptimally assessed. IMPRESSION: Foley catheter decompresses urinary bladder. Heterogeneous debris surrounding the catheter may be infection or blood products/clot. Electronically Signed   By: Narda Rutherford M.D.   On: 10/27/2023 19:47   DG CHEST PORT 1 VIEW Result Date: 10/26/2023 CLINICAL DATA:  Shortness of breath EXAM: PORTABLE CHEST 1 VIEW COMPARISON:  06/03/2022 FINDINGS: Cardiac shadow is enlarged but stable. Postsurgical changes and defibrillator are again identified and stable. The lungs are well aerated bilaterally. No focal infiltrate or effusion is seen. No bony abnormality is noted. IMPRESSION: No acute abnormality noted. Electronically Signed   By: Alcide Clever M.D.   On: 10/26/2023 21:24     Scheduled Meds:  ferrous sulfate  325 mg Oral Q M,W,F   insulin aspart  0-6 Units Subcutaneous TID WC   levothyroxine  112 mcg Oral QAC breakfast   polyvinyl alcohol  2 drop Both Eyes BID   rosuvastatin  5 mg Oral Q M,W,F   sertraline  100 mg Oral Daily   sodium chloride flush  3 mL Intravenous Q12H   umeclidinium-vilanterol  1 puff Inhalation Daily   Continuous Infusions:  sodium chloride 75 mL/hr at 10/28/23 0640   cefTRIAXone (ROCEPHIN)  IV Stopped (10/27/23 1641)   sodium chloride irrigation       LOS: 2 days    Time spent:    Zannie Cove, MD [facility]   10/28/2023, 11:21 AM

## 2023-10-28 NOTE — Plan of Care (Signed)

## 2023-10-28 NOTE — Progress Notes (Addendum)
 Urology Progress Note   Patient: Elaine [name] ZOX:[id number] DOB: 1941-03-14 DOA: 10/26/2023     2 DOS: the patient was seen and examined on 10/28/2023   Subjective: Less issues with catheter clotting overnight but still having the gait as needed.  The patient reports that she is well rested and was able to sleep last night.  Continues to trend downward slightly, 8.7 this morning.  CBI on a low medium drip which appears very light pink.  Pelvic ultrasound today showed some clot burden but nothing significant.  Physical Exam: Vitals:   10/28/23 0802 10/28/23 0816 10/28/23 0900 10/28/23 1014  BP:  (!) 109/43 (!) 107/47 (!) 107/50  Pulse:  74 73 72  Resp:  14 17 14   Temp:  98.7 F (37.1 C)    TempSrc:  Oral    SpO2: 95% 95% 98% 97%  Weight:       A&O x 3 Abd soft, NT Foley draining light pink on medium gtt  Hand irrigated with 1L with small amt old clot evacuated.  All of the clot appears to be old.  At the end of hand irrigation, urine very light pink.  CBI resumed.   CBC    Latest Ref Rng & Units 10/28/2023    2:24 AM 10/27/2023    9:52 AM 10/26/2023   10:25 PM  CBC  WBC 4.0 - 10.5 K/uL 8.6  9.1    Hemoglobin 12.0 - 15.0 g/dL 8.7  9.4  9.8   Hematocrit 36.0 - 46.0 % 24.9  27.1  28.3   Platelets 150 - 400 K/uL 174  177     A/P:  Gross hematuria- Continue to hand irrigate as needed and titrate drip.  Hold Eliquis.  Overall there does seem to be improvement.  Aggressive hand irrigation today with evacuation of what appears to be old residual clot.  CBI was resumed.  At this point, given improvement, will hold off on any surgical intervention.  There is minimal clot burden overall hematuria does appear to be slowing.  In addition, she has had previous cystoscopies and does not have any particular underlying bladder pathology, as such suspect this is just diffuse hemorrhagic cystitis and utility of fulguration would be somewhat limited.  Patient is agreeable this plan.  Plan was  communicated with nursing and was advanced.  We will continue to follow.  Vanna Scotland, MD 10/28/2023 11:42 AM   I spent 40 total minutes on the day of the encounter including pre-visit review of the medical record, face-to-face time with the patient, and post visit ordering of labs/imaging/tests.

## 2023-10-29 DIAGNOSIS — R310 Gross hematuria: Secondary | ICD-10-CM | POA: Diagnosis not present

## 2023-10-29 LAB — BASIC METABOLIC PANEL
Anion gap: 12 (ref 5–15)
BUN: 83 mg/dL — ABNORMAL HIGH (ref 8–23)
CO2: 21 mmol/L — ABNORMAL LOW (ref 22–32)
Calcium: 8.6 mg/dL — ABNORMAL LOW (ref 8.9–10.3)
Chloride: 99 mmol/L (ref 98–111)
Creatinine, Ser: 2.98 mg/dL — ABNORMAL HIGH (ref 0.44–1.00)
GFR, Estimated: 15 mL/min — ABNORMAL LOW (ref >60)
Glucose, Bld: 97 mg/dL (ref 70–99)
Potassium: 3.9 mmol/L (ref 3.5–5.1)
Sodium: 132 mmol/L — ABNORMAL LOW (ref 135–145)

## 2023-10-29 LAB — URINE CULTURE: Culture: >=100000 — AB

## 2023-10-29 LAB — VITAMIN B12: Vitamin B-12: 1141 pg/mL — ABNORMAL HIGH (ref 180–914)

## 2023-10-29 LAB — CBC
HCT: 24 % — ABNORMAL LOW (ref 36.0–46.0)
Hemoglobin: 8.2 g/dL — ABNORMAL LOW (ref 12.0–15.0)
MCH: 30.6 pg (ref 26.0–34.0)
MCHC: 34.2 g/dL (ref 30.0–36.0)
MCV: 89.6 fL (ref 80.0–100.0)
Platelets: 155 10*3/uL (ref 150–400)
RBC: 2.68 MIL/uL — ABNORMAL LOW (ref 3.87–5.11)
RDW: 15.7 % — ABNORMAL HIGH (ref 11.5–15.5)
WBC: 8 10*3/uL (ref 4.0–10.5)
nRBC: 0 % (ref 0.0–0.2)

## 2023-10-29 LAB — FOLATE: Folate: 38.9 ng/mL (ref >5.9)

## 2023-10-29 LAB — FERRITIN: Ferritin: 82 ng/mL (ref 11–307)

## 2023-10-29 LAB — RETICULOCYTES
Immature Retic Fract: 33 % — ABNORMAL HIGH (ref 2.3–15.9)
RBC.: 2.81 MIL/uL — ABNORMAL LOW (ref 3.87–5.11)
Retic Count, Absolute: 82.3 10*3/uL (ref 19.0–186.0)
Retic Ct Pct: 2.9 % (ref 0.4–3.1)

## 2023-10-29 LAB — GLUCOSE, CAPILLARY
Glucose-Capillary: 102 mg/dL — ABNORMAL HIGH (ref 70–99)
Glucose-Capillary: 167 mg/dL — ABNORMAL HIGH (ref 70–99)
Glucose-Capillary: 207 mg/dL — ABNORMAL HIGH (ref 70–99)
Glucose-Capillary: 120 mg/dL — ABNORMAL HIGH (ref 70–99)

## 2023-10-29 LAB — IRON AND TIBC
Iron: 28 ug/dL (ref 28–170)
Saturation Ratios: 8 % — ABNORMAL LOW (ref 10.4–31.8)
TIBC: 347 ug/dL (ref 250–450)
UIBC: 319 ug/dL

## 2023-10-29 MED ORDER — SODIUM CHLORIDE 0.9 % IV SOLN
500.0000 mg | INTRAVENOUS | Status: AC
  Administered 2023-10-29 – 2023-10-30 (×2): 500 mg via INTRAVENOUS
  Filled 2023-10-29 (×2): qty 500

## 2023-10-29 MED ORDER — CHLORHEXIDINE GLUCONATE CLOTH 2 % EX PADS
6.0000 | MEDICATED_PAD | Freq: Every day | CUTANEOUS | Status: DC
  Administered 2023-10-29 – 2023-11-02 (×5): 6 via TOPICAL

## 2023-10-29 MED ORDER — SODIUM CHLORIDE 0.9 % IV SOLN: 250.0000 mg | Freq: Every day | INTRAVENOUS | Status: DC

## 2023-10-29 MED ORDER — IRON SUCROSE 200 MG IVPB - SIMPLE MED
200.0000 mg | Status: AC
  Administered 2023-10-29 – 2023-10-30 (×2): 200 mg via INTRAVENOUS
  Filled 2023-10-29: qty 110
  Filled 2023-10-29: qty 200

## 2023-10-29 MED ORDER — METHOCARBAMOL 500 MG PO TABS
500.0000 mg | ORAL_TABLET | Freq: Three times a day (TID) | ORAL | Status: DC | PRN
  Administered 2023-10-31: 500 mg via ORAL
  Filled 2023-10-29: qty 1

## 2023-10-29 MED ORDER — POLYETHYLENE GLYCOL 3350 17 G PO PACK
17.0000 g | PACK | Freq: Every day | ORAL | Status: DC
  Administered 2023-10-29 – 2023-11-02 (×5): 17 g via ORAL
  Filled 2023-10-29 (×5): qty 1

## 2023-10-29 MED ORDER — SODIUM CHLORIDE 0.9 % IV SOLN
1.0000 g | Freq: Every day | INTRAVENOUS | Status: DC
  Administered 2023-10-29: 1 g via INTRAVENOUS
  Filled 2023-10-29: qty 10

## 2023-10-29 NOTE — Progress Notes (Signed)
 Urology Progress Note   Patient: Elaine [name] GNF:[id number] DOB: 05-Jun-1941 DOA: 10/26/2023     3 DOS: the patient was seen and examined on 10/29/2023   Subjective: No acute complaints. HCT Continues to trend downward slightly, 8.2 this morning.  CBI on a low drip which appears very light pink.    Physical Exam: Vitals:   10/29/23 0200 10/29/23 0435 10/29/23 0740 10/29/23 0849  BP: (!) 110/48 (!) 119/58 119/68 (!) 112/50  Pulse:  74 75 73  Resp:  18 13 20   Temp:  98.4 F (36.9 C) 98.1 F (36.7 C) 98.1 F (36.7 C)  TempSrc:  Oral Oral Oral  SpO2:  94% 100% 99%  Weight:  95.8 kg     A&O x 3 Abd soft, NT Foley draining light pink on low gtt     CBC    Latest Ref Rng & Units 10/29/2023    2:37 AM 10/28/2023    2:24 AM 10/27/2023    9:52 AM  CBC  WBC 4.0 - 10.5 K/uL 8.0  8.6  9.1   Hemoglobin 12.0 - 15.0 g/dL 8.2  8.7  9.4   Hematocrit 36.0 - 46.0 % 24.0  24.9  27.1   Platelets 150 - 400 K/uL 155  174  177    A/P:  Gross hematuria- Continue to hand irrigate as needed and titrate drip.  Hold Eliquis.  Overall trending better.   At this point, given improvement, think likelihood of operative intervention low.  There is minimal clot burden overall hematuria does appear to be slowing.    We will continue to follow.  Despina Arias, MD 10/29/2023 1:01 PM   I spent 30 total minutes on the day of the encounter including pre-visit review of the medical record, face-to-face time with the patient, and post visit ordering of labs/imaging/tests.

## 2023-10-29 NOTE — Progress Notes (Signed)
 PROGRESS NOTE    Elaine [name]  UEA:[id number] DOB: 1941-07-03 DOA: 10/26/2023 PCP: Cleatis Polka., MD   83 y.o. female with medical history significant of CAD, paroxysmal atrial fibrillation on Eliquis, ischemic cardiomyopathy s/p CABG and AICD, heart failure with reduced EF, COPD, hypothyroidism, diabetes mellitus type 2, CKD stage IV who presented to the ED 2/20 with inability to urinate and hematuria.  Noted urinary retention early a.m. with severe lower abdominal pain,.daughter noted large blood clot and significant amount of blood in her diaper. She has a clear preference against dialysis, influenced by her father's experiences with the treatment. She is a DNR and has expressed a strong desire not to undergo dialysis if it becomes necessary. -In the ED afebrile, vital signs stable, BC 11.5, hemoglobin 11.3, trended down to 9, potassium 6.2, CO2 16, creatinine 4.5. -Urology was consulted and were able to place Foley catheter with three-way irrigation. -Having hematuria with frank clots, requiring continuous bladder irrigation with suctioning  Subjective: -Some coughing overnight, feels fair overall, still with mild hematuria  Assessment and Plan:  Gross hematuria with bladder outlet obstruction Acute kidney injury superimposed on chronic kidney disease stage IV Hemorrhagic cystitis -Had persistent hematuria with clots, urology following, continuous bladder irrigation ongoing, slow improvement noted -Per urology suspected to be hemorrhagic cystitis -Urine culture with E. coli, resistant to ceftriaxone, changed to cefepime -Eliquis on hold  -Increase activity, PT OT   Hypotension -  BP remains soft -Now improving, antihypertensives held   Acute blood loss anemia -Hemoglobin slowly trending down 8.2 today -Transfused 1 unit PRBC on admission, anemia panel with severe iron deficiency, add IV iron -Check CBC daily  Hyperkalemia Hyponatremia -Improved   Paroxysmal atrial  fibrillation on chronic anticoagulation S/p pacemaker Patient appears to be in a sinus rhythm at this time. -CHA2DS2-VASc score equal to 6.   -Hold Eliquis   Ischemic cardiomyopathy s/p CABG 2015 and AICD Chronic systolic CHF -Appears euvolemic -Last echo 3/24 noted EF 25 to 30% with all apical segments akinetic with dyskinesia of the LV apex -AKI improving, restart diuretics tomorrow  COPD, without exacerbation Patient does not appear to be in acute exacerbation. -Continue home inhaler -Albuterol nebs as needed for shortness of breath/wheezing   Controlled diabetes mellitus type 2, without long-term use of insulin -A1 C is 7.2, CBGs are stable, SSI   Hypothyroidism -Continue levothyroxine   Hyperlipidemia -Continue Crestor   Obesity BMI 39.11 kg/m.   DVT prophylaxis: SCDs Advance Care Planning:   Code Status: Do not attempt resuscitation (DNR) PRE-ARREST INTERVENTIONS DESIRED    Consults: Urology   Family Communication: Son at bedside at Disposition Plan: Home pending improvement  Consultants:    Procedures:   Antimicrobials:    Objective: Vitals:   10/29/23 0200 10/29/23 0435 10/29/23 0740 10/29/23 0849  BP: (!) 110/48 (!) 119/58 119/68 (!) 112/50  Pulse:  74 75 73  Resp:  18 13 20   Temp:  98.4 F (36.9 C) 98.1 F (36.7 C) 98.1 F (36.7 C)  TempSrc:  Oral Oral Oral  SpO2:  94% 100% 99%  Weight:  95.8 kg      Intake/Output Summary (Last 24 hours) at 10/29/2023 1146 Last data filed at 10/29/2023 0939 Gross per 24 hour  Intake 21470.99 ml  Output 47829 ml  Net -6604.01 ml   Filed Weights   10/27/23 0441 10/28/23 0400 10/29/23 0435  Weight: 92.6 kg 94.1 kg 95.8 kg    Examination:  Gen: Awake, Alert, Oriented X 3,  HEENT: no JVD Lungs: Good air movement bilaterally, CTAB CVS: S1S2/RRR Abd: soft, Non tender, non distended, BS present Extremities: Trace edema, chronic skin changes -GU: Foley catheter three-way with hematuria Skin: No  rashes Psychiatry:  Mood & affect appropriate.     Data Reviewed:   CBC: Recent Labs  Lab 10/26/23 0730 10/26/23 1309 10/26/23 2225 10/27/23 0952 10/28/23 0224 10/29/23 0237  WBC 11.5*  --   --  9.1 8.6 8.0  HGB 11.3* 9.5* 9.8* 9.4* 8.7* 8.2*  HCT 34.6* 28.8* 28.3* 27.1* 24.9* 24.0*  MCV 90.8  --   --  86.9 88.6 89.6  PLT 232  --   --  177 174 155   Basic Metabolic Panel: Recent Labs  Lab 10/26/23 0730 10/26/23 1029 10/27/23 0952 10/28/23 0224 10/29/23 0237  NA 128*  --  128* 129* 132*  K 6.2* 5.6* 4.4 3.6 3.9  CL 94*  --  92* 96* 99  CO2 16*  --  16* 19* 21*  GLUCOSE 224*  --  188* 135* 97  BUN 102*  --  99* 92* 83*  CREATININE 4.49*  --  4.10* 3.44* 2.98*  CALCIUM 9.8  --  9.3 8.8* 8.6*   GFR: Estimated Creatinine Clearance: 15.4 mL/min (A) (by C-G formula based on SCr of 2.98 mg/dL (H)). Liver Function Tests: Recent Labs  Lab 10/28/23 0224  AST 24  ALT 16  ALKPHOS 62  BILITOT 0.8  PROT 5.6*  ALBUMIN 2.9*   No results for input(s): "LIPASE", "AMYLASE" in the last 168 hours. No results for input(s): "AMMONIA" in the last 168 hours. Coagulation Profile: No results for input(s): "INR", "PROTIME" in the last 168 hours. Cardiac Enzymes: No results for input(s): "CKTOTAL", "CKMB", "CKMBINDEX", "TROPONINI" in the last 168 hours. BNP (last 3 results) No results for input(s): "PROBNP" in the last 8760 hours. HbA1C: Recent Labs    10/26/23 2225  HGBA1C 7.2*   CBG: Recent Labs  Lab 10/28/23 1249 10/28/23 1554 10/28/23 2110 10/29/23 0539 10/29/23 1110  GLUCAP 139* 140* 145* 102* 167*   Lipid Profile: No results for input(s): "CHOL", "HDL", "LDLCALC", "TRIG", "CHOLHDL", "LDLDIRECT" in the last 72 hours. Thyroid Function Tests: No results for input(s): "TSH", "T4TOTAL", "FREET4", "T3FREE", "THYROIDAB" in the last 72 hours. Anemia Panel: Recent Labs    10/29/23 0746  VITAMINB12 1,141*  FOLATE 38.9  FERRITIN 82  TIBC 347  IRON 28  RETICCTPCT  2.9   Urine analysis:    Component Value Date/Time   COLORURINE RED (A) 10/26/2023 0844   APPEARANCEUR TURBID (A) 10/26/2023 0844   LABSPEC  10/26/2023 0844    TEST NOT REPORTED DUE TO COLOR INTERFERENCE OF URINE PIGMENT   PHURINE  10/26/2023 0844    TEST NOT REPORTED DUE TO COLOR INTERFERENCE OF URINE PIGMENT   GLUCOSEU (A) 10/26/2023 0844    TEST NOT REPORTED DUE TO COLOR INTERFERENCE OF URINE PIGMENT   HGBUR (A) 10/26/2023 0844    TEST NOT REPORTED DUE TO COLOR INTERFERENCE OF URINE PIGMENT   BILIRUBINUR (A) 10/26/2023 0844    TEST NOT REPORTED DUE TO COLOR INTERFERENCE OF URINE PIGMENT   KETONESUR (A) 10/26/2023 0844    TEST NOT REPORTED DUE TO COLOR INTERFERENCE OF URINE PIGMENT   PROTEINUR (A) 10/26/2023 0844    TEST NOT REPORTED DUE TO COLOR INTERFERENCE OF URINE PIGMENT   UROBILINOGEN 0.2 02/02/2014 1943   NITRITE (A) 10/26/2023 0844    TEST NOT REPORTED DUE TO COLOR INTERFERENCE OF URINE PIGMENT  LEUKOCYTESUR (A) 10/26/2023 0844    TEST NOT REPORTED DUE TO COLOR INTERFERENCE OF URINE PIGMENT   Sepsis Labs: @LABRCNTIP (procalcitonin:4,lacticidven:4)  ) Recent Results (from the past 240 hours)  Urine Culture (for pregnant, neutropenic or urologic patients or patients with an indwelling urinary catheter)     Status: Abnormal   Collection Time: 10/26/23  3:41 PM   Specimen: Urine, Clean Catch  Result Value Ref Range Status   Specimen Description URINE, CLEAN CATCH  Final   Special Requests   Final    NONE Performed at [facility], [address], [place], [place] [address]    Culture >=100,000 COLONIES/mL ESCHERICHIA COLI (A)  Final   Report Status 10/29/2023 FINAL  Final   Organism ID, Bacteria ESCHERICHIA COLI (A)  Final      Susceptibility   Escherichia coli - MIC*    AMPICILLIN >=32 RESISTANT Resistant     CEFAZOLIN >=64 RESISTANT Resistant     CEFEPIME 1 SENSITIVE Sensitive     CEFTRIAXONE >=64 RESISTANT Resistant     CIPROFLOXACIN <=0.25 SENSITIVE  Sensitive     GENTAMICIN <=1 SENSITIVE Sensitive     IMIPENEM 1 SENSITIVE Sensitive     NITROFURANTOIN <=16 SENSITIVE Sensitive     TRIMETH/SULFA <=20 SENSITIVE Sensitive     AMPICILLIN/SULBACTAM >=32 RESISTANT Resistant     PIP/TAZO >=128 RESISTANT Resistant ug/mL    * >=100,000 COLONIES/mL ESCHERICHIA COLI     Radiology Studies: US PELVIS LIMITED (TRANSABDOMINAL ONLY) Result Date: 10/27/2023 CLINICAL DATA:  Gross hematuria. EXAM: LIMITED ULTRASOUND OF PELVIS TECHNIQUE: Limited transabdominal ultrasound examination of the pelvis was performed. COMPARISON:  CT 10/26/2023 FINDINGS: Foley catheter decompresses the urinary bladder. There is heterogeneous debris surrounding the catheter without vascularity. The bladder wall is suboptimally assessed. IMPRESSION: Foley catheter decompresses urinary bladder. Heterogeneous debris surrounding the catheter may be infection or blood products/clot. Electronically Signed   By: Narda Rutherford M.D.   On: 10/27/2023 19:47     Scheduled Meds:  Chlorhexidine Gluconate Cloth  6 each Topical Q0600   ferrous sulfate  325 mg Oral Q M,W,F   insulin aspart  0-6 Units Subcutaneous TID WC   levothyroxine  112 mcg Oral QAC breakfast   polyvinyl alcohol  2 drop Both Eyes BID   rosuvastatin  5 mg Oral Q M,W,F   sertraline  100 mg Oral Daily   sodium chloride flush  3 mL Intravenous Q12H   umeclidinium-vilanterol  1 puff Inhalation Daily   Continuous Infusions:  sodium chloride 75 mL/hr at 10/29/23 0505   ceFEPime (MAXIPIME) IV     sodium chloride irrigation       LOS: 3 days    Time spent:    Zannie Cove, MD [facility]   10/29/2023, 11:46 AM

## 2023-10-29 NOTE — Plan of Care (Signed)
  Problem: Nutrition: Goal: Adequate nutrition will be maintained Outcome: Progressing   Problem: Safety: Goal: Ability to remain free from injury will improve Outcome: Progressing   Problem: Skin Integrity: Goal: Risk for impaired skin integrity will decrease Outcome: Progressing   Problem: Pain Managment: Goal: General experience of comfort will improve and/or be controlled Outcome: Progressing

## 2023-10-30 ENCOUNTER — Encounter (HOSPITAL_COMMUNITY): Payer: Self-pay | Admitting: Internal Medicine

## 2023-10-30 DIAGNOSIS — R310 Gross hematuria: Secondary | ICD-10-CM | POA: Diagnosis not present

## 2023-10-30 LAB — BASIC METABOLIC PANEL
Anion gap: 11 (ref 5–15)
BUN: 73 mg/dL — ABNORMAL HIGH (ref 8–23)
CO2: 20 mmol/L — ABNORMAL LOW (ref 22–32)
Calcium: 8.7 mg/dL — ABNORMAL LOW (ref 8.9–10.3)
Chloride: 101 mmol/L (ref 98–111)
Creatinine, Ser: 2.6 mg/dL — ABNORMAL HIGH (ref 0.44–1.00)
GFR, Estimated: 18 mL/min — ABNORMAL LOW (ref >60)
Glucose, Bld: 91 mg/dL (ref 70–99)
Potassium: 3.4 mmol/L — ABNORMAL LOW (ref 3.5–5.1)
Sodium: 132 mmol/L — ABNORMAL LOW (ref 135–145)

## 2023-10-30 LAB — GLUCOSE, CAPILLARY
Glucose-Capillary: 116 mg/dL — ABNORMAL HIGH (ref 70–99)
Glucose-Capillary: 151 mg/dL — ABNORMAL HIGH (ref 70–99)
Glucose-Capillary: 136 mg/dL — ABNORMAL HIGH (ref 70–99)
Glucose-Capillary: 189 mg/dL — ABNORMAL HIGH (ref 70–99)

## 2023-10-30 LAB — CBC
HCT: 22.8 % — ABNORMAL LOW (ref 36.0–46.0)
Hemoglobin: 7.9 g/dL — ABNORMAL LOW (ref 12.0–15.0)
MCH: 31 pg (ref 26.0–34.0)
MCHC: 34.6 g/dL (ref 30.0–36.0)
MCV: 89.4 fL (ref 80.0–100.0)
Platelets: 159 10*3/uL (ref 150–400)
RBC: 2.55 MIL/uL — ABNORMAL LOW (ref 3.87–5.11)
RDW: 15.8 % — ABNORMAL HIGH (ref 11.5–15.5)
WBC: 8 10*3/uL (ref 4.0–10.5)
nRBC: 0.3 % — ABNORMAL HIGH (ref 0.0–0.2)

## 2023-10-30 MED ORDER — TORSEMIDE 20 MG PO TABS
20.0000 mg | ORAL_TABLET | Freq: Every day | ORAL | Status: DC
Start: 2023-10-30 — End: 2023-11-02
  Administered 2023-10-30 – 2023-11-02 (×4): 20 mg via ORAL
  Filled 2023-10-30 (×4): qty 1

## 2023-10-30 MED ORDER — ENSURE ENLIVE PO LIQD
237.0000 mL | Freq: Two times a day (BID) | ORAL | Status: DC
  Administered 2023-10-31 – 2023-11-02 (×5): 237 mL via ORAL

## 2023-10-30 NOTE — Progress Notes (Signed)
 PROGRESS NOTE    Elaine [name]  MWN:[id number] DOB: 09-25-1940 DOA: 10/26/2023 PCP: Cleatis Polka., MD   83 y.o. female with medical history significant of CAD, paroxysmal atrial fibrillation on Eliquis, ischemic cardiomyopathy s/p CABG and AICD, heart failure with reduced EF, COPD, hypothyroidism, diabetes mellitus type 2, CKD stage IV who presented to the ED 2/20 with inability to urinate and hematuria.  Noted urinary retention early a.m. with severe lower abdominal pain,.daughter noted large blood clot and significant amount of blood in her diaper. She has a clear preference against dialysis, influenced by her father's experiences with the treatment. She is a DNR and has expressed a strong desire not to undergo dialysis if it becomes necessary. -In the ED afebrile, vital signs stable, BC 11.5, hemoglobin 11.3, trended down to 9, potassium 6.2, CO2 16, creatinine 4.5. -Urology was consulted and were able to place Foley catheter with three-way irrigation. -Having hematuria with frank clots, requiring continuous bladder irrigation with suctioning -3/4: CBI clamped  Subjective: -Feels fair, no events overnight, CBI clamped this morning  Assessment and Plan:  Gross hematuria with bladder outlet obstruction Acute kidney injury superimposed on chronic kidney disease stage IV Hemorrhagic cystitis -Had persistent hematuria with clots, urology following, continuous bladder irrigation ongoing, slow improvement noted, CBI being clamped today -Per urology suspected to be hemorrhagic cystitis -Urine culture with E. coli, resistant to ceftriaxone, changed to imipenem X 2 days, discontinue antibiotics after today's dose -Eliquis on hold  -Increase activity, PT OT   Hypotension -  BP remains soft -Now improving, antihypertensives held   Acute blood loss anemia -Hemoglobin slowly trending down 8.2 today -Transfused 1 unit PRBC on admission, anemia panel with severe iron deficiency, given IV  iron -Check CBC daily  Hyperkalemia Hyponatremia -Improved   Paroxysmal atrial fibrillation on chronic anticoagulation S/p pacemaker Patient appears to be in a sinus rhythm at this time. -CHA2DS2-VASc score equal to 6.   -Hold Eliquis   Ischemic cardiomyopathy s/p CABG 2015 and AICD Chronic systolic CHF -Appears euvolemic -Last echo 3/24 noted EF 25 to 30% with all apical segments akinetic with dyskinesia of the LV apex -AKI improving, restart diuretics today  COPD, without exacerbation Patient does not appear to be in acute exacerbation. -Continue home inhaler -Albuterol nebs as needed for shortness of breath/wheezing   Controlled diabetes mellitus type 2, without long-term use of insulin -A1 C is 7.2, CBGs are stable, SSI   Hypothyroidism -Continue levothyroxine   Hyperlipidemia -Continue Crestor   Obesity BMI 39.11 kg/m.   DVT prophylaxis: SCDs Advance Care Planning:   Code Status: Do not attempt resuscitation (DNR) PRE-ARREST INTERVENTIONS DESIRED    Consults: Urology   Family Communication: Discussed with son yesterday Disposition Plan: Home pending improvement  Consultants:    Procedures:   Antimicrobials:    Objective: Vitals:   10/30/23 0011 10/30/23 0615 10/30/23 0755 10/30/23 1135  BP: (!) 116/58 (!) 119/55 (!) 123/94 (!) 124/53  Pulse: 73 72 72   Resp: 15 14 (!) 23 17  Temp: 98.1 F (36.7 C) 98.6 F (37 C) 99 F (37.2 C) (!) 97.5 F (36.4 C)  TempSrc: Oral Oral Oral Oral  SpO2: 100% 100% 100% 100%  Weight: 96.5 kg       Intake/Output Summary (Last 24 hours) at 10/30/2023 1218 Last data filed at 10/30/2023 0747 Gross per 24 hour  Intake 40347 ml  Output 12350 ml  Net 130 ml   Filed Weights   10/28/23 0400 10/29/23 0435 10/30/23  0011  Weight: 94.1 kg 95.8 kg 96.5 kg    Examination:  Obese chronically ill female sitting up in bed, AAOx3 HEENT: Neck obese unable to assess JVD CVS: S1-S2, regular rhythm Lungs: Rare basilar  Rales Abdomen: Soft, nontender, bowel sounds present Remedies: Trace edema, chronic skin changes   Data Reviewed:   CBC: Recent Labs  Lab 10/26/23 0730 10/26/23 1309 10/26/23 2225 10/27/23 0952 10/28/23 0224 10/29/23 0237 10/30/23 0321  WBC 11.5*  --   --  9.1 8.6 8.0 8.0  HGB 11.3*   < > 9.8* 9.4* 8.7* 8.2* 7.9*  HCT 34.6*   < > 28.3* 27.1* 24.9* 24.0* 22.8*  MCV 90.8  --   --  86.9 88.6 89.6 89.4  PLT 232  --   --  177 174 155 159   < > = values in this interval not displayed.   Basic Metabolic Panel: Recent Labs  Lab 10/26/23 0730 10/26/23 1029 10/27/23 0952 10/28/23 0224 10/29/23 0237 10/30/23 0321  NA 128*  --  128* 129* 132* 132*  K 6.2* 5.6* 4.4 3.6 3.9 3.4*  CL 94*  --  92* 96* 99 101  CO2 16*  --  16* 19* 21* 20*  GLUCOSE 224*  --  188* 135* 97 91  BUN 102*  --  99* 92* 83* 73*  CREATININE 4.49*  --  4.10* 3.44* 2.98* 2.60*  CALCIUM 9.8  --  9.3 8.8* 8.6* 8.7*   GFR: Estimated Creatinine Clearance: 17.8 mL/min (A) (by C-G formula based on SCr of 2.6 mg/dL (H)). Liver Function Tests: Recent Labs  Lab 10/28/23 0224  AST 24  ALT 16  ALKPHOS 62  BILITOT 0.8  PROT 5.6*  ALBUMIN 2.9*   No results for input(s): "LIPASE", "AMYLASE" in the last 168 hours. No results for input(s): "AMMONIA" in the last 168 hours. Coagulation Profile: No results for input(s): "INR", "PROTIME" in the last 168 hours. Cardiac Enzymes: No results for input(s): "CKTOTAL", "CKMB", "CKMBINDEX", "TROPONINI" in the last 168 hours. BNP (last 3 results) No results for input(s): "PROBNP" in the last 8760 hours. HbA1C: No results for input(s): "HGBA1C" in the last 72 hours.  CBG: Recent Labs  Lab 10/29/23 1110 10/29/23 1550 10/29/23 2124 10/30/23 0613 10/30/23 1131  GLUCAP 167* 207* 120* 116* 151*   Lipid Profile: No results for input(s): "CHOL", "HDL", "LDLCALC", "TRIG", "CHOLHDL", "LDLDIRECT" in the last 72 hours. Thyroid Function Tests: No results for input(s):  "TSH", "T4TOTAL", "FREET4", "T3FREE", "THYROIDAB" in the last 72 hours. Anemia Panel: Recent Labs    10/29/23 0746  VITAMINB12 1,141*  FOLATE 38.9  FERRITIN 82  TIBC 347  IRON 28  RETICCTPCT 2.9   Urine analysis:    Component Value Date/Time   COLORURINE RED (A) 10/26/2023 0844   APPEARANCEUR TURBID (A) 10/26/2023 0844   LABSPEC  10/26/2023 0844    TEST NOT REPORTED DUE TO COLOR INTERFERENCE OF URINE PIGMENT   PHURINE  10/26/2023 0844    TEST NOT REPORTED DUE TO COLOR INTERFERENCE OF URINE PIGMENT   GLUCOSEU (A) 10/26/2023 0844    TEST NOT REPORTED DUE TO COLOR INTERFERENCE OF URINE PIGMENT   HGBUR (A) 10/26/2023 0844    TEST NOT REPORTED DUE TO COLOR INTERFERENCE OF URINE PIGMENT   BILIRUBINUR (A) 10/26/2023 0844    TEST NOT REPORTED DUE TO COLOR INTERFERENCE OF URINE PIGMENT   KETONESUR (A) 10/26/2023 0844    TEST NOT REPORTED DUE TO COLOR INTERFERENCE OF URINE PIGMENT   PROTEINUR (A)  10/26/2023 0844    TEST NOT REPORTED DUE TO COLOR INTERFERENCE OF URINE PIGMENT   UROBILINOGEN 0.2 02/02/2014 1943   NITRITE (A) 10/26/2023 0844    TEST NOT REPORTED DUE TO COLOR INTERFERENCE OF URINE PIGMENT   LEUKOCYTESUR (A) 10/26/2023 0844    TEST NOT REPORTED DUE TO COLOR INTERFERENCE OF URINE PIGMENT   Sepsis Labs: @LABRCNTIP (procalcitonin:4,lacticidven:4)  ) Recent Results (from the past 240 hours)  Urine Culture (for pregnant, neutropenic or urologic patients or patients with an indwelling urinary catheter)     Status: Abnormal   Collection Time: 10/26/23  3:41 PM   Specimen: Urine, Clean Catch  Result Value Ref Range Status   Specimen Description URINE, CLEAN CATCH  Final   Special Requests   Final    NONE Performed at [facility], [address] [place], [place] [address]    Culture >=100,000 COLONIES/mL ESCHERICHIA COLI (A)  Final   Report Status 10/29/2023 FINAL  Final   Organism ID, Bacteria ESCHERICHIA COLI (A)  Final      Susceptibility   Escherichia coli -  MIC*    AMPICILLIN >=32 RESISTANT Resistant     CEFAZOLIN >=64 RESISTANT Resistant     CEFEPIME 1 SENSITIVE Sensitive     CEFTRIAXONE >=64 RESISTANT Resistant     CIPROFLOXACIN <=0.25 SENSITIVE Sensitive     GENTAMICIN <=1 SENSITIVE Sensitive     IMIPENEM 1 SENSITIVE Sensitive     NITROFURANTOIN <=16 SENSITIVE Sensitive     TRIMETH/SULFA <=20 SENSITIVE Sensitive     AMPICILLIN/SULBACTAM >=32 RESISTANT Resistant     PIP/TAZO >=128 RESISTANT Resistant ug/mL    * >=100,000 COLONIES/mL ESCHERICHIA COLI     Radiology Studies: No results found.    Scheduled Meds:  Chlorhexidine Gluconate Cloth  6 each Topical Q0600   ferrous sulfate  325 mg Oral Q M,W,F   insulin aspart  0-6 Units Subcutaneous TID WC   levothyroxine  112 mcg Oral QAC breakfast   polyethylene glycol  17 g Oral Daily   polyvinyl alcohol  2 drop Both Eyes BID   rosuvastatin  5 mg Oral Q M,W,F   sertraline  100 mg Oral Daily   sodium chloride flush  3 mL Intravenous Q12H   umeclidinium-vilanterol  1 puff Inhalation Daily   Continuous Infusions:  ertapenem 500 mg (10/29/23 1454)   iron sucrose 200 mg (10/29/23 1625)   sodium chloride irrigation       LOS: 4 days    Time spent:    Zannie Cove, MD [facility]   10/30/2023, 12:18 PM

## 2023-10-30 NOTE — TOC Initial Note (Signed)
 Transition of Care [facility]) - Initial/Assessment Note    Patient Details  Name: Elaine [name] MRN: [id number] Date of Birth: 07/01/1941  [facility]) CM/SW Contact:    Leone Haven, RN Phone Number: 10/30/2023, 4:28 PM  Clinical Narrative:                 From home alone, has PCP, Dr. Martha Clan  and insurance on file, states has no HH services in place at this time , has walker, very old rollator over 48 years old, shower chair and bsc at home.  States her brother will transport them home at [employer]  or she may need ambulance and family is support system, states gets medications from [facility] on [address]  Pta self ambulatory with walker. Await PT eval. She states her rollator is over 66 yrs old and she would like another one, she is ok with [employer].  NCM made referral to [facility] with [employer].   Expected Discharge Plan: Home w Home Health Services Barriers to Discharge: Continued Medical Work up   Patient Goals and CMS Choice Patient states their goals for this hospitalization and ongoing recovery are:: return home   Choice offered to / list presented to : NA      Expected Discharge Plan and Services   Discharge Planning Services: CM Consult Post Acute Care Choice: NA Living arrangements for the past 2 months: Apartment                 DME Arranged: Walker rolling with seat DME Agency: [employer] Date DME Agency Contacted: 10/30/23 Time DME Agency Contacted: 1627 Representative spoke with at DME Agency: Vaughan Basta HH Arranged: NA          Prior Living Arrangements/Services Living arrangements for the past 2 months: Apartment Lives with:: Self Patient language and need for interpreter reviewed:: Yes Do you feel safe going back to the place where you live?: Yes      Need for Family Participation in Patient Care: Yes (Comment) Care giver support system in place?: Yes (comment) Current home services: DME (walker, rollator, shower chair,  bsc) Criminal Activity/Legal Involvement Pertinent to Current Situation/Hospitalization: No - Comment as needed  Activities of Daily Living      Permission Sought/Granted Permission sought to share information with : Case Manager Permission granted to share information with : Yes, Verbal Permission Granted              Emotional Assessment Appearance:: Appears stated age Attitude/Demeanor/Rapport: Engaged Affect (typically observed): Appropriate Orientation: : Oriented to Self, Oriented to Place, Oriented to  Time, Oriented to Situation Alcohol / Substance Use: Not Applicable Psych Involvement: No (comment)  Admission diagnosis:  Gross hematuria [R31.0] AKI (acute kidney injury) (HCC) [N17.9] Acute urinary retention [R33.8] Hematuria [R31.9] Patient Active Problem List   Diagnosis Date Noted   Hematuria 10/26/2023   Bladder outlet obstruction 10/26/2023   Acute blood loss anemia 10/26/2023   Urinary tract infection 10/26/2023   Hyperkalemia 10/26/2023   Heart failure with reduced ejection fraction (HCC) 10/26/2023   Controlled type 2 diabetes mellitus without complication, without long-term current use of insulin (HCC) 10/26/2023   COPD (chronic obstructive pulmonary disease) (HCC) 10/26/2023   Bacteremia    Acute on chronic systolic CHF (congestive heart failure) (HCC) 06/03/2022   Cystitis 06/03/2022   Chronic respiratory failure with hypoxia (HCC) 06/03/2022   Endocarditis of tricuspid valve 06/03/2022   Class 3 obesity 06/03/2022   Carpal tunnel  syndrome, right upper limb 10/18/2020   Carpal tunnel syndrome, left upper limb 10/18/2020   Chronic systolic (congestive) heart failure (HCC) 07/29/2019   Hypothyroidism 06/16/2019   Acute on chronic combined systolic and diastolic CHF (congestive heart failure) (HCC) 06/11/2019   Bilateral leg pain 10/22/2017   Chronic combined systolic and diastolic CHF, NYHA class 2 (HCC) 10/22/2017   Edema of both legs 05/10/2015    Mass of right breast on mammogram 10/06/2014   DOE (dyspnea on exertion) 08/19/2014   Severe obesity (BMI >= 40) (HCC) 04/19/2014   CAD status post CABG 04/19/2014   Metabolic acidosis 02/05/2014    Class: Acute   Acute on chronic renal insufficiency 02/02/2014   Near syncope 02/02/2014   Hypotension 02/02/2014   Rash 02/02/2014   Pruritus 02/02/2014    Class: Chronic   SOB (shortness of breath) 01/07/2014   Coumadin Rx post CABG 11/17/2013   Physical deconditioning 11/05/2013   S/P CABG x 3- 10/23/13 10/23/2013   Permanent atrial fibrillation (HCC) - CHA2DS2-VASc Score 6, on Eliquis 10/20/2013   Left bundle branch block (LBBB) on electrocardiogram 10/20/2013   Left main coronary artery disease 10/20/2013    Class: Hospitalized for   History of acute anterior wall myocardial infarction 10/20/2013   Ischemic cardiomyopathy 10/20/2013   Morbid obesity- BMI 48    Diabetic neuropathy (HCC)    Acute kidney injury superimposed on chronic kidney disease (HCC)    Post corneal transplant 08/05/2012   Obesity 12/20/2006   Type 2 diabetes mellitus with hyperlipidemia (HCC) 08/06/2006   Hyperlipidemia with target LDL less than 70 08/06/2006   Essential hypertension 08/06/2006   Osteoarthritis 08/06/2006   PCP:  Cleatis Polka., MD Pharmacy:   [facility] [address] [place] - [address] [place] [address] Phone: [phone] Fax: [phone]  [facility]) - [place], [place] - [address] [facility] [address] [place] [address] [place] [address] Phone: [phone] Fax: [phone]  [facility] - [place], [place] - [address] [place] [address] Phone: [phone] Fax: [phone]  [facility] - [place], [place] - [address] [place] [address] Phone: [phone] Fax: [phone]     Social  Drivers of Health (SDOH) Social History: SDOH Screenings   Food Insecurity: No Food Insecurity (10/26/2023)  Housing: Unknown (10/26/2023)  Transportation Needs: No Transportation Needs (10/26/2023)  Utilities: At Risk (10/26/2023)  Depression (PHQ2-9): Low Risk  (10/04/2023)  Financial Resource Strain: Medium Risk (11/25/2019)  Social Connections: Unknown (10/27/2023)  Tobacco Use: Medium Risk (10/04/2023)   SDOH Interventions:     Readmission Risk Interventions    10/30/2023    4:22 PM  Readmission Risk Prevention Plan  Transportation Screening Complete  PCP or Specialist Appt within 5-7 Days Complete  Home Care Screening Complete  Medication Review (RN CM) Complete

## 2023-10-30 NOTE — Progress Notes (Signed)
 Urology Progress Note   Patient: Elaine [name] ZOX:[id number] DOB: August 03, 1941 DOA: 10/26/2023     4 DOS: the patient was seen and examined on 10/30/2023   Subjective: No acute complaints. HCT Continues to trend downward slightly, 7.9 this morning.  CBI on a low drip which appears very light pink with some bloody sediment  Physical Exam: Vitals:   10/29/23 2001 10/30/23 0011 10/30/23 0615 10/30/23 0755  BP: 121/69 (!) 116/58 (!) 119/55 (!) 123/94  Pulse: 73 73 72 72  Resp: 18 15 14  (!) 23  Temp: 98.6 F (37 C) 98.1 F (36.7 C) 98.6 F (37 C) 99 F (37.2 C)  TempSrc: Oral Oral Oral Oral  SpO2: 100% 100% 100% 100%  Weight:  96.5 kg     A&O x 3 Abd soft, NT Foley draining light pink on low gtt     CBC    Latest Ref Rng & Units 10/30/2023    3:21 AM 10/29/2023    2:37 AM 10/28/2023    2:24 AM  CBC  WBC 4.0 - 10.5 K/uL 8.0  8.0  8.6   Hemoglobin 12.0 - 15.0 g/dL 7.9  8.2  8.7   Hematocrit 36.0 - 46.0 % 22.8  24.0  24.9   Platelets 150 - 400 K/uL 159  155  174    A/P:  Gross hematuria- I held CBI this AM. I suspect urine will intermittently pink up as old clots pass, which is OK. Continue to hand irrigate as needed and it is ok to restart CBI if needed.  Hold Eliquis.  Overall continues to trend better.     We will continue to follow.  Despina Arias, MD 10/30/2023 9:15 AM   I spent 30 total minutes on the day of the encounter including pre-visit review of the medical record, face-to-face time with the patient, and post visit ordering of labs/imaging/tests.

## 2023-10-30 NOTE — Evaluation (Signed)
 Occupational Therapy Evaluation Patient Details Name: Elaine [name] MRN: [id number] DOB: [birth year] Today's Date: 10/30/2023   History of Present Illness   83 y.o. female adm 2/28 with medical history significant of CAD, paroxysmal atrial fibrillation on Eliquis, ischemic cardiomyopathy s/p CABG and AICD, heart failure with reduced EF, COPD, hypothyroidism, diabetes mellitus type 2, CKD stage IV who presents with inability to urinate and bleeding.     Clinical Impressions Patient admitted for the diagnosis above.  PTA she lives alone in an entry level apartment, with increasing assist for ADL and iADL over the past two weeks from friends/neighbors/family, particularly with lower body ADL.  Patient presents with decreased activity tolerance, poor dynamic balance and generalized weakness.  Currently needing Mod A for lower body ADL and Min A for simple transfers at RW level.  Patient does not want to go to a SNF, but the Patient would benefit from continued inpatient follow up therapy, <3 hours/day.  Patient would prefer [facility] rehab, but not sure she would have the needed assist at home.  OT will continue efforts in the acute setting to address deficits.       If plan is discharge home, recommend the following:   Assist for transportation;A lot of help with bathing/dressing/bathroom;A little help with walking and/or transfers;Assistance with cooking/housework     Functional Status Assessment   Patient has had a recent decline in their functional status and demonstrates the ability to make significant improvements in function in a reasonable and predictable amount of time.     Equipment Recommendations   None recommended by OT     Recommendations for Other Services         Precautions/Restrictions   Precautions Precautions: Fall Recall of Precautions/Restrictions: Intact Restrictions Weight Bearing Restrictions Per Provider Order: No     Mobility Bed Mobility Overal bed  mobility: Needs Assistance Bed Mobility: Supine to Sit     Supine to sit: Mod assist       Patient Response: Cooperative  Transfers Overall transfer level: Needs assistance Equipment used: Rolling walker (2 wheels) Transfers: Sit to/from Stand, Bed to chair/wheelchair/BSC Sit to Stand: Min assist     Step pivot transfers: Min assist            Balance Overall balance assessment: Needs assistance Sitting-balance support: Feet supported Sitting balance-Leahy Scale: Good     Standing balance support: Reliant on assistive device for balance Standing balance-Leahy Scale: Poor                             ADL either performed or assessed with clinical judgement   ADL Overall ADL's : Needs assistance/impaired Eating/Feeding: Independent;Sitting   Grooming: Wash/dry hands;Wash/dry face;Set up;Sitting   Upper Body Bathing: Contact guard assist;Sitting   Lower Body Bathing: Moderate assistance;Sit to/from stand   Upper Body Dressing : Minimal assistance;Sitting   Lower Body Dressing: Moderate assistance;Sit to/from stand   Toilet Transfer: Minimal assistance;BSC/3in1;Stand-pivot                   Vision Patient Visual Report: No change from baseline       Perception Perception: Within Functional Limits       Praxis Praxis: WFL       Pertinent Vitals/Pain Pain Assessment Pain Assessment: No/denies pain     Extremity/Trunk Assessment Upper Extremity Assessment Upper Extremity Assessment: Generalized weakness   Lower Extremity Assessment Lower Extremity Assessment: Defer to PT evaluation  Cervical / Trunk Assessment Cervical / Trunk Assessment: Normal   Communication Communication Communication: No apparent difficulties   Cognition Arousal: Alert Behavior During Therapy: WFL for tasks assessed/performed Cognition: No apparent impairments                               Following commands: Intact        Cueing  General Comments   Cueing Techniques: Verbal cues   VSS on RA   Exercises     Shoulder Instructions      Home Living Family/patient expects to be discharged to:: Private residence Living Arrangements: Alone Available Help at Discharge: Family;Friend(s);Available PRN/intermittently Type of Home: Apartment Home Access: Level entry     Home Layout: One level     Bathroom Shower/Tub: Producer, television/film/video: Handicapped height Bathroom Accessibility: Yes How Accessible: Accessible via walker Home Equipment: Rolling Walker (2 wheels);Rollator (4 wheels);Shower seat;Grab bars - tub/shower;Grab bars - toilet;Hand held shower head;Wheelchair - manual          Prior Functioning/Environment Prior Level of Function : Independent/Modified Independent             Mobility Comments: Patient uses [employer] in/out of the home, no longer drives ADLs Comments: Reports Mod I at baseline, but has needed more assist with ADL and iADL from neighbors/family the last 2 weeks    OT Problem List: Decreased strength;Decreased activity tolerance;Impaired balance (sitting and/or standing)   OT Treatment/Interventions: Self-care/ADL training;Therapeutic activities;DME and/or AE instruction;Patient/family education;Balance training      OT Goals(Current goals can be found in the care plan section)   Acute Rehab OT Goals Patient Stated Goal: Return home OT Goal Formulation: With patient Time For Goal Achievement: 11/13/23 Potential to Achieve Goals: Good ADL Goals Pt Will Perform Grooming: with supervision;standing Pt Will Perform Upper Body Dressing: with set-up;sitting Pt Will Perform Lower Body Dressing: sit to/from stand;with contact guard assist Pt Will Transfer to Toilet: with supervision;ambulating;regular height toilet   OT Frequency:  Min 1X/week    Co-evaluation              AM-PAC OT "6 Clicks" Daily Activity     Outcome Measure Help from another  person eating meals?: None Help from another person taking care of personal grooming?: A Little Help from another person toileting, which includes using toliet, bedpan, or urinal?: A Little Help from another person bathing (including washing, rinsing, drying)?: A Lot Help from another person to put on and taking off regular upper body clothing?: A Little Help from another person to put on and taking off regular lower body clothing?: A Lot 6 Click Score: 17   End of Session Equipment Utilized During Treatment: Rolling walker (2 wheels);Oxygen Nurse Communication: Mobility status  Activity Tolerance: Patient tolerated treatment well Patient left: in chair;with call bell/phone within reach;with chair alarm set  OT Visit Diagnosis: Unsteadiness on feet (R26.81);Muscle weakness (generalized) (M62.81)                Time: 6578-4696 OT Time Calculation (min): 25 min Charges:  OT General Charges $OT Visit: 1 Visit OT Evaluation $OT Eval Moderate Complexity: 1 Mod OT Treatments $Self Care/Home Management : 8-22 mins  10/30/2023  RP, OTR/L  Acute Rehabilitation Services  Office:  [phone]   Suzanna Obey 10/30/2023, 11:56 AM

## 2023-10-31 DIAGNOSIS — R310 Gross hematuria: Secondary | ICD-10-CM | POA: Diagnosis not present

## 2023-10-31 LAB — CBC
HCT: 26 % — ABNORMAL LOW (ref 36.0–46.0)
Hemoglobin: 8.5 g/dL — ABNORMAL LOW (ref 12.0–15.0)
MCH: 30.2 pg (ref 26.0–34.0)
MCHC: 32.7 g/dL (ref 30.0–36.0)
MCV: 92.5 fL (ref 80.0–100.0)
Platelets: 193 10*3/uL (ref 150–400)
RBC: 2.81 MIL/uL — ABNORMAL LOW (ref 3.87–5.11)
RDW: 16.2 % — ABNORMAL HIGH (ref 11.5–15.5)
WBC: 7.4 10*3/uL (ref 4.0–10.5)
nRBC: 0 % (ref 0.0–0.2)

## 2023-10-31 LAB — BASIC METABOLIC PANEL
Anion gap: 11 (ref 5–15)
BUN: 68 mg/dL — ABNORMAL HIGH (ref 8–23)
CO2: 24 mmol/L (ref 22–32)
Calcium: 9.2 mg/dL (ref 8.9–10.3)
Chloride: 100 mmol/L (ref 98–111)
Creatinine, Ser: 2.24 mg/dL — ABNORMAL HIGH (ref 0.44–1.00)
GFR, Estimated: 21 mL/min — ABNORMAL LOW (ref >60)
Glucose, Bld: 98 mg/dL (ref 70–99)
Potassium: 4.2 mmol/L (ref 3.5–5.1)
Sodium: 135 mmol/L (ref 135–145)

## 2023-10-31 LAB — GLUCOSE, CAPILLARY
Glucose-Capillary: 110 mg/dL — ABNORMAL HIGH (ref 70–99)
Glucose-Capillary: 199 mg/dL — ABNORMAL HIGH (ref 70–99)
Glucose-Capillary: 190 mg/dL — ABNORMAL HIGH (ref 70–99)
Glucose-Capillary: 174 mg/dL — ABNORMAL HIGH (ref 70–99)

## 2023-10-31 NOTE — TOC Progression Note (Addendum)
 Transition of Care [facility]) - Progression Note    Patient Details  Name: Elaine [name] MRN: [id number] Date of Birth: Aug 09, 1941  Transition of Care [facility]) CM/SW Contact  Michaela Corner, [place] Phone Number: 10/31/2023, 4:46 PM  Clinical Narrative:   CSW met patient at bedside to discuss PT recs for SNF. Patient states she would rather go home and that her son, "play" daughter, and youngest son live with her. She also reports she has outside support that comes to visit her at home. At this time, patient gave CSW permission to fax out referrals so she can review her options. CSW notified RNCM that patient may want to go home.   TOC will continue to follow.   Expected Discharge Plan: Home w Home Health Services Barriers to Discharge: Continued Medical Work up  Expected Discharge Plan and Services   Discharge Planning Services: CM Consult Post Acute Care Choice: NA Living arrangements for the past 2 months: Apartment                 DME Arranged: Walker rolling with seat DME Agency: [employer] Date DME Agency Contacted: 10/30/23 Time DME Agency Contacted: 1627 Representative spoke with at DME Agency: Vaughan Basta HH Arranged: NA           Social Determinants of Health (SDOH) Interventions SDOH Screenings   Food Insecurity: No Food Insecurity (10/26/2023)  Housing: Low Risk  (10/30/2023)  Transportation Needs: No Transportation Needs (10/26/2023)  Utilities: At Risk (10/26/2023)  Depression (PHQ2-9): Low Risk  (10/04/2023)  Financial Resource Strain: Medium Risk (11/25/2019)  Social Connections: Moderately Isolated (10/30/2023)  Tobacco Use: Medium Risk (10/30/2023)    Readmission Risk Interventions    10/30/2023    4:22 PM  Readmission Risk Prevention Plan  Transportation Screening Complete  PCP or Specialist Appt within 5-7 Days Complete  Home Care Screening Complete  Medication Review (RN CM) Complete

## 2023-10-31 NOTE — Progress Notes (Signed)
 PROGRESS NOTE    Elaine [name]  EAV:[id number] DOB: 1941-07-08 DOA: 10/26/2023 PCP: Cleatis Polka., MD   83 y.o. female with medical history significant of CAD, paroxysmal atrial fibrillation on Eliquis, ischemic cardiomyopathy s/p CABG and AICD, heart failure with reduced EF, COPD, hypothyroidism, diabetes mellitus type 2, CKD stage IV who presented to the ED 2/20 with inability to urinate and hematuria.  Noted urinary retention early a.m. with severe lower abdominal pain,.daughter noted large blood clot and significant amount of blood in her diaper. She has a clear preference against dialysis, influenced by her father's experiences with the treatment. She is a DNR and has expressed a strong desire not to undergo dialysis if it becomes necessary. -In the ED afebrile, vital signs stable, BC 11.5, hemoglobin 11.3, trended down to 9, potassium 6.2, CO2 16, creatinine 4.5. -Urology was consulted and were able to place Foley catheter with three-way irrigation. -Having hematuria with frank clots, requiring continuous bladder irrigation with suctioning -3/4: CBI clamped then restarted -3/5, clamped CBI from slow rate  Subjective: -Feels okay, no events overnight, CBI clamped again this morning  Assessment and Plan:  Gross hematuria with bladder outlet obstruction Acute kidney injury superimposed on chronic kidney disease stage IV Hemorrhagic cystitis -Had persistent hematuria with clots, urology following, treated with several days of bladder irrigation, CBI clamping trial again today -Per urology suspected to be hemorrhagic cystitis -Treated with IV ceftriaxone followed by ertapenem, urine culture with resistant E. coli, antibiotics discontinued now -Eliquis on hold  -Increase activity, PT OT> significant debility but declines SNF   Hypotension -  BP remains soft -Now improving, antihypertensives held   Acute blood loss anemia -Hemoglobin slowly trending down 8.2 today -Transfused 1  unit PRBC on admission, anemia panel with severe iron deficiency, given IV iron -Check CBC daily  Hyperkalemia Hyponatremia -Improved   Paroxysmal atrial fibrillation on chronic anticoagulation S/p pacemaker Patient appears to be in a sinus rhythm at this time. -CHA2DS2-VASc score equal to 6.   -Hold Eliquis   Ischemic cardiomyopathy s/p CABG 2015 and AICD Chronic systolic CHF -Appears euvolemic -Last echo 3/24 noted EF 25 to 30% with all apical segments akinetic with dyskinesia of the LV apex -AKI improving, restarted torsemide -Avoid SGLT2i with above bladder issues  COPD, without exacerbation Patient does not appear to be in acute exacerbation. -Continue home inhaler -Albuterol nebs as needed for shortness of breath/wheezing   Controlled diabetes mellitus type 2, without long-term use of insulin -A1 C is 7.2, CBGs are stable, SSI   Hypothyroidism -Continue levothyroxine   Hyperlipidemia -Continue Crestor   Obesity BMI 39.11 kg/m.   DVT prophylaxis: SCDs Advance Care Planning:   DNR   Consults: Urology   Family Communication: Discussed with son yesterday Disposition Plan: Home likely 1 to 2 days, she declines SNF  Consultants:    Procedures:   Antimicrobials:    Objective: Vitals:   10/31/23 0003 10/31/23 0554 10/31/23 0735 10/31/23 0748  BP: (!) 131/52 (!) 121/57 (!) 145/63   Pulse: 74 72 73   Resp: 12 14 (!) 21   Temp: 98.2 F (36.8 C) 98 F (36.7 C) 97.8 F (36.6 C)   TempSrc: Oral Oral Oral   SpO2: 100% 100% 100% 100%  Weight: 96.8 kg       Intake/Output Summary (Last 24 hours) at 10/31/2023 1130 Last data filed at 10/31/2023 0508 Gross per 24 hour  Intake 240 ml  Output 2725 ml  Net -2485 ml   Filed  Weights   10/29/23 0435 10/30/23 0011 10/31/23 0003  Weight: 95.8 kg 96.5 kg 96.8 kg    Examination:  Obese chronically ill female sitting up in bed, AAOx3 HEENT: Neck obese unable to assess JVD CVS: S1-S2, regular rhythm Lungs: Rare  basilar Rales Abdomen: Soft, nontender, bowel sounds present Remedies: Trace edema, chronic skin changes   Data Reviewed:   CBC: Recent Labs  Lab 10/27/23 0952 10/28/23 0224 10/29/23 0237 10/30/23 0321 10/31/23 0240  WBC 9.1 8.6 8.0 8.0 7.4  HGB 9.4* 8.7* 8.2* 7.9* 8.5*  HCT 27.1* 24.9* 24.0* 22.8* 26.0*  MCV 86.9 88.6 89.6 89.4 92.5  PLT 177 174 155 159 193   Basic Metabolic Panel: Recent Labs  Lab 10/27/23 0952 10/28/23 0224 10/29/23 0237 10/30/23 0321 10/31/23 0240  NA 128* 129* 132* 132* 135  K 4.4 3.6 3.9 3.4* 4.2  CL 92* 96* 99 101 100  CO2 16* 19* 21* 20* 24  GLUCOSE 188* 135* 97 91 98  BUN 99* 92* 83* 73* 68*  CREATININE 4.10* 3.44* 2.98* 2.60* 2.24*  CALCIUM 9.3 8.8* 8.6* 8.7* 9.2   GFR: Estimated Creatinine Clearance: 20.7 mL/min (A) (by C-G formula based on SCr of 2.24 mg/dL (H)). Liver Function Tests: Recent Labs  Lab 10/28/23 0224  AST 24  ALT 16  ALKPHOS 62  BILITOT 0.8  PROT 5.6*  ALBUMIN 2.9*   No results for input(s): "LIPASE", "AMYLASE" in the last 168 hours. No results for input(s): "AMMONIA" in the last 168 hours. Coagulation Profile: No results for input(s): "INR", "PROTIME" in the last 168 hours. Cardiac Enzymes: No results for input(s): "CKTOTAL", "CKMB", "CKMBINDEX", "TROPONINI" in the last 168 hours. BNP (last 3 results) No results for input(s): "PROBNP" in the last 8760 hours. HbA1C: No results for input(s): "HGBA1C" in the last 72 hours.  CBG: Recent Labs  Lab 10/30/23 1131 10/30/23 1541 10/30/23 2104 10/31/23 0552 10/31/23 1106  GLUCAP 151* 136* 189* 110* 199*   Lipid Profile: No results for input(s): "CHOL", "HDL", "LDLCALC", "TRIG", "CHOLHDL", "LDLDIRECT" in the last 72 hours. Thyroid Function Tests: No results for input(s): "TSH", "T4TOTAL", "FREET4", "T3FREE", "THYROIDAB" in the last 72 hours. Anemia Panel: Recent Labs    10/29/23 0746  VITAMINB12 1,141*  FOLATE 38.9  FERRITIN 82  TIBC 347  IRON 28   RETICCTPCT 2.9   Urine analysis:    Component Value Date/Time   COLORURINE RED (A) 10/26/2023 0844   APPEARANCEUR TURBID (A) 10/26/2023 0844   LABSPEC  10/26/2023 0844    TEST NOT REPORTED DUE TO COLOR INTERFERENCE OF URINE PIGMENT   PHURINE  10/26/2023 0844    TEST NOT REPORTED DUE TO COLOR INTERFERENCE OF URINE PIGMENT   GLUCOSEU (A) 10/26/2023 0844    TEST NOT REPORTED DUE TO COLOR INTERFERENCE OF URINE PIGMENT   HGBUR (A) 10/26/2023 0844    TEST NOT REPORTED DUE TO COLOR INTERFERENCE OF URINE PIGMENT   BILIRUBINUR (A) 10/26/2023 0844    TEST NOT REPORTED DUE TO COLOR INTERFERENCE OF URINE PIGMENT   KETONESUR (A) 10/26/2023 0844    TEST NOT REPORTED DUE TO COLOR INTERFERENCE OF URINE PIGMENT   PROTEINUR (A) 10/26/2023 0844    TEST NOT REPORTED DUE TO COLOR INTERFERENCE OF URINE PIGMENT   UROBILINOGEN 0.2 02/02/2014 1943   NITRITE (A) 10/26/2023 0844    TEST NOT REPORTED DUE TO COLOR INTERFERENCE OF URINE PIGMENT   LEUKOCYTESUR (A) 10/26/2023 0844    TEST NOT REPORTED DUE TO COLOR INTERFERENCE OF URINE PIGMENT  Sepsis Labs: @LABRCNTIP (procalcitonin:4,lacticidven:4)  ) Recent Results (from the past 240 hours)  Urine Culture (for pregnant, neutropenic or urologic patients or patients with an indwelling urinary catheter)     Status: Abnormal   Collection Time: 10/26/23  3:41 PM   Specimen: Urine, Clean Catch  Result Value Ref Range Status   Specimen Description URINE, CLEAN CATCH  Final   Special Requests   Final    NONE Performed at [facility], [address], [place], [place] [address]    Culture >=100,000 COLONIES/mL ESCHERICHIA COLI (A)  Final   Report Status 10/29/2023 FINAL  Final   Organism ID, Bacteria ESCHERICHIA COLI (A)  Final      Susceptibility   Escherichia coli - MIC*    AMPICILLIN >=32 RESISTANT Resistant     CEFAZOLIN >=64 RESISTANT Resistant     CEFEPIME 1 SENSITIVE Sensitive     CEFTRIAXONE >=64 RESISTANT Resistant     CIPROFLOXACIN  <=0.25 SENSITIVE Sensitive     GENTAMICIN <=1 SENSITIVE Sensitive     IMIPENEM 1 SENSITIVE Sensitive     NITROFURANTOIN <=16 SENSITIVE Sensitive     TRIMETH/SULFA <=20 SENSITIVE Sensitive     AMPICILLIN/SULBACTAM >=32 RESISTANT Resistant     PIP/TAZO >=128 RESISTANT Resistant ug/mL    * >=100,000 COLONIES/mL ESCHERICHIA COLI     Radiology Studies: No results found.    Scheduled Meds:  Chlorhexidine Gluconate Cloth  6 each Topical Q0600   feeding supplement  237 mL Oral BID BM   ferrous sulfate  325 mg Oral Q M,W,F   insulin aspart  0-6 Units Subcutaneous TID WC   levothyroxine  112 mcg Oral QAC breakfast   polyethylene glycol  17 g Oral Daily   polyvinyl alcohol  2 drop Both Eyes BID   rosuvastatin  5 mg Oral Q M,W,F   sertraline  100 mg Oral Daily   sodium chloride flush  3 mL Intravenous Q12H   torsemide  20 mg Oral Daily   umeclidinium-vilanterol  1 puff Inhalation Daily   Continuous Infusions:  sodium chloride irrigation       LOS: 5 days    Time spent:    Zannie Cove, MD [facility]   10/31/2023, 11:30 AM

## 2023-10-31 NOTE — Progress Notes (Addendum)
 Clamped CBI from slow rate pink urine in tubing   1520 restarted CBI slow- Urine became dark in tubing rather than pink   1915 clamped CBI- urine pale yellow color in tubing

## 2023-10-31 NOTE — Care Management Important Message (Signed)
 Important Message  Patient Details  Name: Elaine [name] MRN: [id number] Date of Birth: Sep 20, 1940   Important Message Given:  Yes - Medicare IM     Renie Ora 10/31/2023, 11:05 AM

## 2023-10-31 NOTE — Plan of Care (Signed)
  Problem: Education: Goal: Knowledge of General Education information will improve Description: Including pain rating scale, medication(s)/side effects and non-pharmacologic comfort measures Outcome: Progressing   Problem: Clinical Measurements: Goal: Will remain free from infection Outcome: Progressing   Problem: Elimination: Goal: Will not experience complications related to bowel motility Outcome: Progressing   Problem: Pain Managment: Goal: General experience of comfort will improve and/or be controlled Outcome: Progressing   Problem: Skin Integrity: Goal: Risk for impaired skin integrity will decrease Outcome: Progressing

## 2023-10-31 NOTE — Evaluation (Signed)
 Physical Therapy Evaluation Patient Details Name: Elaine [name] MRN: [id number] DOB: 1941-08-01 Today's Date: 10/31/2023  History of Present Illness  Pt is an 83 y.o. F admitted 2/28 with inability to urinate and hematuria. Noted urinary retention early a.m. with severe lower abdominal pain,.daughter noted large blood clot and significant amount of blood in her diaper. Pt has frank clots, requiring  continuous bladder irrigation with suctioning. PMH significant of CAD, paroxysmal atrial fibrillation on Eliquis, ischemic cardiomyopathy s/p CABG and AICD, heart failure with reduced EF, COPD, hypothyroidism, diabetes mellitus type 2, CKD stage IV.   Clinical Impression  Pt admitted with above diagnosis. PTA, pt was ModI for mobility using rollator, independent with ADLs, and relies on family assistance for IADLs. Pt reports wearing 5L O2 at home and states she doesn't know how to turn down the flow. She resides alone in a handicap accessible apartment. Pt currently with functional limitations due to the deficits listed below (see PT Problem List). She performed all functional mobility with CGA using rollator. Pt demonstrated poor safety awareness by maintaining BUE support on front bar of rollator for transfers and ambulation despite education on proper sequencing/technique and VC/TC. She reported "this is what works for me and how I'm gonna do it."  Pt will benefit from acute skilled PT to increase her independence and safety with mobility to allow discharge. Given the uncertainty around level of family support available and pt's high fall risk recommend post-acute rehab <3 hours/day of therapy.      If plan is discharge home, recommend the following: A little help with walking and/or transfers;A little help with bathing/dressing/bathroom;Assistance with cooking/housework;Assist for transportation   Can travel by private vehicle   Yes    Equipment Recommendations None recommended by PT (Pt has DME)   Recommendations for Other Services       Functional Status Assessment Patient has had a recent decline in their functional status and demonstrates the ability to make significant improvements in function in a reasonable and predictable amount of time.     Precautions / Restrictions Precautions Precautions: Fall Recall of Precautions/Restrictions: Intact Precaution/Restrictions Comments: Pt is blind in her R eye. Restrictions Weight Bearing Restrictions Per Provider Order: No      Mobility  Bed Mobility Overal bed mobility: Needs Assistance Bed Mobility: Supine to Sit     Supine to sit: HOB elevated, Used rails, Contact guard     General bed mobility comments: Pt sat up on R side of bed with increased time. She was able to slowly shift BLE off EOB. Pt utilized bed rail and was able to reach upright with CGA. Scooted til feet flat with increased time and BUE support on bed.    Transfers Overall transfer level: Needs assistance Equipment used: Rollator (4 wheels) Transfers: Sit to/from Stand, Bed to chair/wheelchair/BSC Sit to Stand: Contact guard assist   Step pivot transfers: Contact guard assist       General transfer comment: STS from lowest bed height. Cued pt on proper sequencing and hand positioning using rollator. Pt locked rollator to stand. She declined VC, reporting "this is what I do at home and works for me." Pt flipped the front rollator back back towards her and pulled with BUE to acheive standing. Instructed pt to place BUE on rollator handles so she could steer, break, and lock/unlock AD, but she declined restating the same thing. Pt maintains BUE support on front bar of rollator. When sitting pt didn't lock rollator despite VC and reached  back with LUE to recliner chair. Good eccentric control with sitting.    Ambulation/Gait Ambulation/Gait assistance: Contact guard assist Gait Distance (Feet): 10 Feet Assistive device: Rollator (4 wheels) Gait  Pattern/deviations: Step-to pattern, Decreased weight shift to left, Decreased stance time - left, Decreased stride length Gait velocity: reduced Gait velocity interpretation: <1.31 ft/sec, indicative of household ambulator   General Gait Details: Pt ambulated from EOB around foot of bed to recliner chair. She maintained BUE support on front rollator bar despite VC/TC and education on proper sequencing and safety concerns. Pt reported "this is what I do at home and works for me, I won't be changing." Pt took short small steps favoring her R side d/t LLE weakness and pain.  Stairs            Wheelchair Mobility     Tilt Bed    Modified Rankin (Stroke Patients Only)       Balance Overall balance assessment: Needs assistance Sitting-balance support: Feet supported, Bilateral upper extremity supported Sitting balance-Leahy Scale: Fair Sitting balance - Comments: Pt sat EOB with supervision.   Standing balance support: Reliant on assistive device for balance, During functional activity Standing balance-Leahy Scale: Poor Standing balance comment: Pt dependent on rollator for support when OOB. She is unsteady and maintains BUE support on front of rollator, required CGA for stability.                             Pertinent Vitals/Pain Pain Assessment Pain Assessment: 0-10 Pain Score: 9  Pain Location: BLE Pain Descriptors / Indicators: Aching, Discomfort Pain Intervention(s): Monitored during session, Repositioned    Home Living Family/patient expects to be discharged to:: Private residence Living Arrangements: Alone Available Help at Discharge: Family;Available PRN/intermittently (Pt reports her children are local and she could try and coordinate coverage so someone is staying with her initially. Her daughter is a retired Lawyer. Her 2 sons will come assist as she instructs, but the youngest couldn't physically lift her.) Type of Home: Apartment Home Access: Level  entry       Home Layout: One level Home Equipment: Agricultural consultant (2 wheels);Rollator (4 wheels);Shower seat;Grab bars - tub/shower;Grab bars - toilet;Hand held shower head;Wheelchair - manual;Other (comment) (Supplemental Oxygen) Additional Comments: Pt reports she has a recliner chair that can help lift her up.    Prior Function Prior Level of Function : Independent/Modified Independent;History of Falls (last six months)             Mobility Comments: Indep using rollator. Pt reports when her swelling gets too bad her family will push her in the w/c or on the rollator. Pt states she avoids stairs. Pt reports she has fallen 2 times in the past couple of months. ADLs Comments: Indep with ADLs, manages her own medications and finances. Pt reports she used to drive, but has stopped recently and relies on family.     Extremity/Trunk Assessment   Upper Extremity Assessment Upper Extremity Assessment: Defer to OT evaluation    Lower Extremity Assessment Lower Extremity Assessment: RLE deficits/detail;LLE deficits/detail RLE Deficits / Details: Pt unable to lift hip off EOB. Knee ext/flex 3/5. Ankle PF/DF 4/5. RLE: Unable to fully assess due to pain RLE Sensation: WNL LLE Deficits / Details: Grossly 2/5 LLE: Unable to fully assess due to pain LLE Sensation: WNL    Cervical / Trunk Assessment Cervical / Trunk Assessment: Normal  Communication   Communication Communication: No apparent difficulties  Cognition Arousal: Alert Behavior During Therapy: WFL for tasks assessed/performed   PT - Cognitive impairments: Safety/Judgement                       PT - Cognition Comments: Pt A,Ox4. She demonstrated poor safety awarness with improper technique to use rollator and disregard for education on proper technqiue despite recognizing she has had 2 falls recently. Following commands: Intact       Cueing Cueing Techniques: Verbal cues     General Comments General  comments (skin integrity, edema, etc.): VSS on 2L O2 via [place]. BLE 1+ pitting edema.    Exercises     Assessment/Plan    PT Assessment Patient needs continued PT services  PT Problem List Decreased strength;Decreased activity tolerance;Decreased balance;Decreased mobility;Decreased coordination;Decreased safety awareness;Pain;Obesity       PT Treatment Interventions DME instruction;Gait training;Functional mobility training;Therapeutic activities;Therapeutic exercise;Balance training;Patient/family education    PT Goals (Current goals can be found in the Care Plan section)  Acute Rehab PT Goals Patient Stated Goal: Go Home and get stronger PT Goal Formulation: With patient Time For Goal Achievement: 11/14/23 Potential to Achieve Goals: Fair    Frequency Min 2X/week     Co-evaluation               AM-PAC PT "6 Clicks" Mobility  Outcome Measure Help needed turning from your back to your side while in a flat bed without using bedrails?: A Lot Help needed moving from lying on your back to sitting on the side of a flat bed without using bedrails?: A Lot Help needed moving to and from a bed to a chair (including a wheelchair)?: A Little Help needed standing up from a chair using your arms (e.g., wheelchair or bedside chair)?: A Little Help needed to walk in hospital room?: A Lot Help needed climbing 3-5 steps with a railing? : Total 6 Click Score: 13    End of Session Equipment Utilized During Treatment: Gait belt;Oxygen Activity Tolerance: Patient tolerated treatment well;Patient limited by pain Patient left: in chair;with call bell/phone within reach;with chair alarm set Nurse Communication: Mobility status PT Visit Diagnosis: History of falling (Z91.81);Muscle weakness (generalized) (M62.81);Difficulty in walking, not elsewhere classified (R26.2);Pain Pain - Right/Left: Left Pain - part of body: Leg;Ankle and joints of foot    Time: 4098-1191 PT Time Calculation (min)  (ACUTE ONLY): 34 min   Charges:   PT Evaluation $PT Eval Moderate Complexity: 1 Mod PT Treatments $Therapeutic Activity: 8-22 mins PT General Charges $$ ACUTE PT VISIT: 1 Visit         Cheri Guppy, PT, DPT Acute Rehabilitation Services Office: [phone] Secure Chat Preferred  Richardson Chiquito 10/31/2023, 1:38 PM

## 2023-10-31 NOTE — Progress Notes (Signed)
 Urology Progress Note   Patient: Elaine [name] OZH:[id number] DOB: 1940/09/13 DOA: 10/26/2023     5 DOS: the patient was seen and examined on 10/31/2023. HCT 8.5 this morning.  CBI held. Urine is clear, tea colored with bloody sediment and old appearing clot fragments - per documentation looks like CBI Was held his AM around 715  Physical Exam: Vitals:   10/31/23 0554 10/31/23 0735 10/31/23 0748 10/31/23 1110  BP: (!) 121/57 (!) 145/63  (!) 125/43  Pulse: 72 73  74  Resp: 14 (!) 21  16  Temp: 98 F (36.7 C) 97.8 F (36.6 C)  97.7 F (36.5 C)  TempSrc: Oral Oral  Oral  SpO2: 100% 100% 100% 96%  Weight:       A&O x 3 Abd soft, NT     CBC    Latest Ref Rng & Units 10/31/2023    2:40 AM 10/30/2023    3:21 AM 10/29/2023    2:37 AM  CBC  WBC 4.0 - 10.5 K/uL 7.4  8.0  8.0   Hemoglobin 12.0 - 15.0 g/dL 8.5  7.9  8.2   Hematocrit 36.0 - 46.0 % 26.0  22.8  24.0   Platelets 150 - 400 K/uL 193  159  155    A/P:  Gross hematuria- CBI held this AM and urine looks good overall - likely still some small old clot fragments in bladder which will intermittently darken up urine, but should not be a problem.   Dispo per primary team. I would send home with foley to be safe, and she may f/u in our clinic for a voiding trial.     At this point will follow peripherally. Please call with any questions  Despina Arias, MD 10/31/2023 2:58 PM   I spent 30 total minutes on the day of the encounter including pre-visit review of the medical record, face-to-face time with the patient, and post visit ordering of labs/imaging/tests.

## 2023-10-31 NOTE — [place] FL2 (Signed)
 [place] MEDICAID FL2 LEVEL OF CARE FORM     IDENTIFICATION  Patient Name: Elaine [name] Birthdate: [birth year] Sex: female Admission Date (Current Location): 10/26/2023  [facility] and [place] Number:  Producer, television/film/video and Address:  [facility], [address] [place], [place] [address]      Provider Number: [id number]  Attending Physician Name and Address:  Zannie Cove, MD  Relative Name and Phone Number:       Current Level of Care: Hospital Recommended Level of Care: Skilled Nursing Facility Prior Approval Number:    Date Approved/Denied:   PASRR Number: [id number]  Discharge Plan: SNF    Current Diagnoses: Patient Active Problem List   Diagnosis Date Noted   Hematuria 10/26/2023   Bladder outlet obstruction 10/26/2023   Acute blood loss anemia 10/26/2023   Urinary tract infection 10/26/2023   Hyperkalemia 10/26/2023   Heart failure with reduced ejection fraction (HCC) 10/26/2023   Controlled type 2 diabetes mellitus without complication, without long-term current use of insulin (HCC) 10/26/2023   COPD (chronic obstructive pulmonary disease) (HCC) 10/26/2023   Bacteremia    Acute on chronic systolic CHF (congestive heart failure) (HCC) 06/03/2022   Cystitis 06/03/2022   Chronic respiratory failure with hypoxia (HCC) 06/03/2022   Endocarditis of tricuspid valve 06/03/2022   Class 3 obesity 06/03/2022   Carpal tunnel syndrome, right upper limb 10/18/2020   Carpal tunnel syndrome, left upper limb 10/18/2020   Chronic systolic (congestive) heart failure (HCC) 07/29/2019   Hypothyroidism 06/16/2019   Acute on chronic combined systolic and diastolic CHF (congestive heart failure) (HCC) 06/11/2019   Bilateral leg pain 10/22/2017   Chronic combined systolic and diastolic CHF, NYHA class 2 (HCC) 10/22/2017   Edema of both legs 05/10/2015   Mass of right breast on mammogram 10/06/2014   DOE (dyspnea on exertion) 08/19/2014   Severe  obesity (BMI >= 40) (HCC) 04/19/2014   CAD status post CABG 04/19/2014   Metabolic acidosis 02/05/2014   Acute on chronic renal insufficiency 02/02/2014   Near syncope 02/02/2014   Hypotension 02/02/2014   Rash 02/02/2014   Pruritus 02/02/2014   SOB (shortness of breath) 01/07/2014   Coumadin Rx post CABG 11/17/2013   Physical deconditioning 11/05/2013   S/P CABG x 3- 10/23/13 10/23/2013   Permanent atrial fibrillation (HCC) - CHA2DS2-VASc Score 6, on Eliquis 10/20/2013   Left bundle branch block (LBBB) on electrocardiogram 10/20/2013   Left main coronary artery disease 10/20/2013   History of acute anterior wall myocardial infarction 10/20/2013   Ischemic cardiomyopathy 10/20/2013   Morbid obesity- BMI 48    Diabetic neuropathy (HCC)    Acute kidney injury superimposed on chronic kidney disease (HCC)    Post corneal transplant 08/05/2012   Obesity 12/20/2006   Type 2 diabetes mellitus with hyperlipidemia (HCC) 08/06/2006   Hyperlipidemia with target LDL less than 70 08/06/2006   Essential hypertension 08/06/2006   Osteoarthritis 08/06/2006    Orientation RESPIRATION BLADDER Height & Weight     Self, Time, Situation, Place  O2 (2L  O2) Indwelling catheter, Continent Weight: 213 lb 6.5 oz (96.8 kg) Height:     BEHAVIORAL SYMPTOMS/MOOD NEUROLOGICAL BOWEL NUTRITION STATUS      Continent Diet (see dc summary)  AMBULATORY STATUS COMMUNICATION OF NEEDS Skin   Extensive Assist Verbally Other (Comment) (Wound / Incision - Irritant Dermatitis, Buttocks Bilateral)  Personal Care Assistance Level of Assistance  Bathing, Feeding, Dressing Bathing Assistance: Limited assistance Feeding assistance: Independent Dressing Assistance: Limited assistance     Functional Limitations Info  Sight, Hearing, Speech Sight Info: Impaired (R eye blind; L eye impaired) Hearing Info: Adequate Speech Info: Adequate    SPECIAL CARE FACTORS FREQUENCY  PT (By licensed  PT), OT (By licensed OT)     PT Frequency: 5x week OT Frequency: 5x week            Contractures Contractures Info: Not present    Additional Factors Info  Code Status, Insulin Sliding Scale, Allergies, Psychotropic Code Status Info: DNR Interven Allergies Info: Crestor (Rosuvastatin Calcium), Allopurinol, Cephalosporins, Tape Psychotropic Info: sertraline (ZOLOFT) Insulin Sliding Scale Info: see dc summary       Current Medications (10/31/2023):  This is the current hospital active medication list Current Facility-Administered Medications  Medication Dose Route Frequency Provider Last Rate Last Admin   albuterol (PROVENTIL) (2.5 MG/3ML) 0.083% nebulizer solution 2.5 mg  2.5 mg Nebulization Q6H PRN Clydie Braun, MD       Chlorhexidine Gluconate Cloth 2 % PADS 6 each  6 each Topical Q0600 Zannie Cove, MD   6 each at 10/31/23 9563   colchicine tablet 0.6 mg  0.6 mg Oral Daily PRN Madelyn Flavors A, MD       dextrose 50 % solution 50 mL  1 ampule Intravenous PRN Katrinka Blazing, Rondell A, MD       feeding supplement (ENSURE ENLIVE / ENSURE PLUS) liquid 237 mL  237 mL Oral BID BM Zannie Cove, MD   237 mL at 10/31/23 1504   ferrous sulfate tablet 325 mg  325 mg Oral Q M,W,F Smith, Rondell A, MD   325 mg at 10/31/23 0912   insulin aspart (novoLOG) injection 0-6 Units  0-6 Units Subcutaneous TID WC Madelyn Flavors A, MD   1 Units at 10/31/23 1632   levothyroxine (SYNTHROID) tablet 112 mcg  112 mcg Oral QAC breakfast Madelyn Flavors A, MD   112 mcg at 10/31/23 0500   methocarbamol (ROBAXIN) tablet 500 mg  500 mg Oral Q8H PRN Zannie Cove, MD   500 mg at 10/31/23 1635   ondansetron (ZOFRAN) injection 4 mg  4 mg Intravenous Q6H PRN Zannie Cove, MD   4 mg at 10/27/23 1629   polyethylene glycol (MIRALAX / GLYCOLAX) packet 17 g  17 g Oral Daily Zannie Cove, MD   17 g at 10/31/23 0912   polyvinyl alcohol (LIQUIFILM TEARS) 1.4 % ophthalmic solution 2 drop  2 drop Both Eyes BID Reome,  Earle J, RPH   2 drop at 10/31/23 0912   rosuvastatin (CRESTOR) tablet 5 mg  5 mg Oral Q M,W,F Smith, Rondell A, MD   5 mg at 10/31/23 0912   sertraline (ZOLOFT) tablet 100 mg  100 mg Oral Daily Katrinka Blazing, Rondell A, MD   100 mg at 10/31/23 0912   sodium chloride flush (NS) 0.9 % injection 3 mL  3 mL Intravenous Q12H Smith, Rondell A, MD   3 mL at 10/31/23 0912   sodium chloride irrigation 0.9 % 3,000 mL  3,000 mL Irrigation Continuous Katrinka Blazing, Rondell A, MD   3,000 mL at 10/30/23 0712   torsemide (DEMADEX) tablet 20 mg  20 mg Oral Daily Zannie Cove, MD   20 mg at 10/31/23 0912   umeclidinium-vilanterol (ANORO ELLIPTA) 62.5-25 MCG/ACT 1 puff  1 puff Inhalation Daily Madelyn Flavors A, MD   1 puff at 10/31/23 [phone]  Discharge Medications: Please see discharge summary for a list of discharge medications.  Relevant Imaging Results:  Relevant Lab Results:   Additional Information SSN [id number], [place]

## 2023-11-01 DIAGNOSIS — R310 Gross hematuria: Secondary | ICD-10-CM | POA: Diagnosis not present

## 2023-11-01 LAB — GLUCOSE, CAPILLARY
Glucose-Capillary: 146 mg/dL — ABNORMAL HIGH (ref 70–99)
Glucose-Capillary: 149 mg/dL — ABNORMAL HIGH (ref 70–99)
Glucose-Capillary: 164 mg/dL — ABNORMAL HIGH (ref 70–99)
Glucose-Capillary: 210 mg/dL — ABNORMAL HIGH (ref 70–99)

## 2023-11-01 LAB — CBC
HCT: 22.6 % — ABNORMAL LOW (ref 36.0–46.0)
Hemoglobin: 7.6 g/dL — ABNORMAL LOW (ref 12.0–15.0)
MCH: 30.6 pg (ref 26.0–34.0)
MCHC: 33.6 g/dL (ref 30.0–36.0)
MCV: 91.1 fL (ref 80.0–100.0)
Platelets: 194 10*3/uL (ref 150–400)
RBC: 2.48 MIL/uL — ABNORMAL LOW (ref 3.87–5.11)
RDW: 16 % — ABNORMAL HIGH (ref 11.5–15.5)
WBC: 8.2 10*3/uL (ref 4.0–10.5)
nRBC: 0 % (ref 0.0–0.2)

## 2023-11-01 LAB — COMPREHENSIVE METABOLIC PANEL
ALT: 25 U/L (ref 0–44)
AST: 30 U/L (ref 15–41)
Albumin: 2.6 g/dL — ABNORMAL LOW (ref 3.5–5.0)
Alkaline Phosphatase: 61 U/L (ref 38–126)
Anion gap: 9 (ref 5–15)
BUN: 63 mg/dL — ABNORMAL HIGH (ref 8–23)
CO2: 24 mmol/L (ref 22–32)
Calcium: 8.7 mg/dL — ABNORMAL LOW (ref 8.9–10.3)
Chloride: 97 mmol/L — ABNORMAL LOW (ref 98–111)
Creatinine, Ser: 2.29 mg/dL — ABNORMAL HIGH (ref 0.44–1.00)
GFR, Estimated: 21 mL/min — ABNORMAL LOW (ref >60)
Glucose, Bld: 156 mg/dL — ABNORMAL HIGH (ref 70–99)
Potassium: 3.8 mmol/L (ref 3.5–5.1)
Sodium: 130 mmol/L — ABNORMAL LOW (ref 135–145)
Total Bilirubin: 0.4 mg/dL (ref 0.0–1.2)
Total Protein: 5.2 g/dL — ABNORMAL LOW (ref 6.5–8.1)

## 2023-11-01 NOTE — TOC Progression Note (Signed)
 Transition of Care [facility]) - Progression Note    Patient Details  Name: Elaine [name] MRN: [id number] Date of Birth: 1940-09-08  Transition [facility]) CM/SW Contact  Leone Haven, RN Phone Number: 11/01/2023, 4:07 PM  Clinical Narrative:    Patient is declining SNF, she does not have a preference of agency.  NCM made referral to [facility] with [facility] for HHPT, HHOT, he is able to take referral.  Soc will begin 24 to 48 hrs post dc.     Expected Discharge Plan: Home w Home Health Services Barriers to Discharge: Continued Medical Work up  Expected Discharge Plan and Services   Discharge Planning Services: CM Consult Post Acute Care Choice: NA Living arrangements for the past 2 months: Apartment                 DME Arranged: Walker rolling with seat DME Agency: [employer] Date DME Agency Contacted: 10/30/23 Time DME Agency Contacted: 1627 Representative spoke with at DME Agency: Vaughan Basta HH Arranged: NA           Social Determinants of Health (SDOH) Interventions SDOH Screenings   Food Insecurity: No Food Insecurity (10/26/2023)  Housing: Low Risk  (10/30/2023)  Transportation Needs: No Transportation Needs (10/26/2023)  Utilities: At Risk (10/26/2023)  Depression (PHQ2-9): Low Risk  (10/04/2023)  Financial Resource Strain: Medium Risk (11/25/2019)  Social Connections: Moderately Isolated (10/30/2023)  Tobacco Use: Medium Risk (10/30/2023)    Readmission Risk Interventions    10/30/2023    4:22 PM  Readmission Risk Prevention Plan  Transportation Screening Complete  PCP or Specialist Appt within 5-7 Days Complete  Home Care Screening Complete  Medication Review (RN CM) Complete

## 2023-11-01 NOTE — Discharge Instructions (Signed)
 [employer] assistance programs Crisis assistance programs  -[facility] Arts development officer. If you are experiencing homelessness in [place], [place], your first point of contact should be Pensions consultant. You can reach Coordinated Entry by calling [phone] or by emailing [email or web address] [email or web address].  Community access points: [employer] [phone], [place]) every Tuesday from 9am-10am. [facility] ([address] [place]) every Wednesday from 8am-9am.   - Coordinated Re-entry Marcy Panning: Dial 211 and request. Offers referrals to homeless shelters in the area.    -The [employer] [phone]) offers several services to local families, as funding allows. The Emergency Assistance Program (EAP), which they administer, provides household goods, free food, clothing, and financial aid to people in need in the [facility] area. The EAP program does have some qualification, and counselors will interview clients for financial assistance by written referral only. Referrals need to be made by the Department of Social Services or by other EAP approved human services agencies or charities in the area.  -[employer], which can be reached at [phone], offers emergency assistance programs for those in need of help, such as food, rent assistance, a soup kitchen, shelter, and clothing. They are based in [facility] but provide a number of services to those that qualify for assistance.   [facility] Department of Social Services may be able to offer temporary financial assistance and cash grants for paying rent and utilities, Help may be provided for local county residents who may be experiencing personal crisis when other resources, including government programs, are not available. Call [phone]  -[employer] is a [employer] agency, The organization can offer emergency assistance for paying rent, [employer], utilities, food, household products and furniture. They offer extensive emergency and transitional housing for families, children and single women, and also run a [employer] and [employer]. Thrift Shops, Secondary school teacher, and other aid offered too. [address], [place], [place] [address], [phone]  -[place] Low Income Energy Assistance Program -- This is offered for [facility] families. The federal government created [employer] Program provides a one-time cash grant payment to help eligible low-income families pay their electric and heating bills. [address] [place], [place] [address], [phone]  -[place] Emergency Assistance -- A program offers emergency utility and rent funds for greater [employer] area residents. The program can also provide counseling and referrals to charities and government programs. Also provides food and a free meal program that serves lunch Mondays - Saturdays and dinner seven days per week to individuals in the community. [address] [place], [place] [address], [phone]  -[employer] - Offers affordable apartment and housing communities across      [place] and [place]. The low income and seniors can access public housing, rental assistance to qualified applicants, and apply for the section 8 rent subsidy program. Other programs include Chiropractor and Engineer, maintenance. [address], [place], [place] [address], dial [phone].  -[facility] provides transitional housing to veterans and the disabled. Clients will also access other services too, including assistance in applying for Disability, life skills classes, case management, and assistance in finding permanent housing. [address], [place], [place]  [place] [address], call [phone]  -[facility] Transitional Housing through [facility] is for people who were just  evicted or that are formerly homeless. The non-profit will also help then gain self-sufficiency, find a home or apartment to live in, and also provides information on rent assistance when needed. Phone [phone]  -The [place] [employer] [facility] helps low income, elderly, or disabled residents in seven counties in the [place] ([place], [place], [place], [place], [place], [place], [place], and [place]) save energy and reduce their utility bills by improving energy efficiency. Phone [phone].  -[employer] is located in the [place] [facility] in the [employer], [address], [place], [place] [address]. Parking is in the rear of the building. Phone: [phone]   General Email: Candis Schatz  [facility] provides free housing counseling assistance in locating affordable rental housing or housing with support services for families and individuals in crisis and the chronically homeless. We provide potential resources for other housing needs like utilities. Our trained counselors also work with clients on budgeting and financial literacy in effort to empower them to take control of their financial situations. [employer] collaborates with homeless service providers and other stakeholders as part of the [employer] COC (Continuum of Care). The (COC) is a regional/local planning body that coordinates housing and services funding for homeless families and individuals. The role of [facility] in the COC is through housing counseling to work with people we serve on diversion strategies for those that are at imminent risk of becoming homeless. We also work with the Coordinated Assessment/Entry Specialist who attempts to find temporary solutions and/or connects the people  to Housing First, Rapid Re-housing or transitional housing programs. Our Homelessness Prevention Housing Counselors meet with clients on business days (Monday-Fridays, except scheduled holidays) from 8:30 am to 4:30 pm.  Social Connections [place] 211 is another useful way to locate resources in the community. Visit [employer] to find service information online. If you need additional assistance, 2-1-1 Referral Specialists are available 24 hours a day, every day by dialing 2-1-1 or [phone] from any phone. The call is free, confidential, and available in any language.  [facility] (Adult Program)  - Address: [address] [place], [place] [address] - General office #:  [phone] - Enrollment Phone #: [phone]  [facility]  - Senior Friendship Line: call toll free, available 24 hours a day, at [phone]

## 2023-11-01 NOTE — Progress Notes (Addendum)
 PROGRESS NOTE    Elaine [name]  UJW:[id number] DOB: [birth year] DOA: 10/26/2023 PCP: Cleatis Polka., MD   83 y.o. female with medical history significant of CAD, paroxysmal atrial fibrillation on Eliquis, ischemic cardiomyopathy s/p CABG and AICD, heart failure with reduced EF, COPD, hypothyroidism, diabetes mellitus type 2, CKD stage IV who presented to the ED 2/20 with inability to urinate and hematuria.  Noted urinary retention early a.m. with severe lower abdominal pain,.daughter noted large blood clot and significant amount of blood in her diaper. She has a clear preference against dialysis, influenced by her father's experiences with the treatment. She is a DNR and has expressed a strong desire not to undergo dialysis if it becomes necessary. -In the ED afebrile, vital signs stable, BC 11.5, hemoglobin 11.3, trended down to 9, potassium 6.2, CO2 16, creatinine 4.5. -Urology was consulted and were able to place Foley catheter with three-way irrigation. -Having hematuria with frank clots, requiring continuous bladder irrigation with hand suctioning to remove clots for 2 to 3 days -3/4: CBI clamped then restarted -3/5, clamped CBI from slow rate  Subjective: -CBI now discontinued, mild hematuria persists, denies dyspnea this morning  Assessment and Plan:  Gross hematuria with bladder outlet obstruction, large clots Acute kidney injury superimposed on chronic kidney disease stage IV Hemorrhagic cystitis -Had persistent hematuria with clots, urology following, treated with 5 days of continuous bladder irrigation as well as hand suctioning large clots,  -CBI clamped yesterday afternoon and finally discontinued, Per urology suspected to be hemorrhagic cystitis -Treated with IV ceftriaxone followed by ertapenem, urine culture with resistant E. coli, antibiotics discontinued now -Eliquis discontinued, would hold for at least 3-4 more weeks -Urology recommended discharge with Foley when  stable -Remains fairly debilitated, PT OT following, recommended SNF, she declines, will maximize home health services   Hypotension -  BP remains soft -Now improving, antihypertensives held   Acute blood loss anemia -Hemoglobin slowly trending down 7.6 today -Transfused 1 unit PRBC on admission, anemia panel with severe iron deficiency, given IV iron -CBC in a.m.  Hyperkalemia Hyponatremia -Improved   Paroxysmal atrial fibrillation on chronic anticoagulation S/p pacemaker Patient appears to be in a sinus rhythm at this time. -CHA2DS2-VASc score equal to 6.   -Hold Eliquis   Ischemic cardiomyopathy s/p CABG 2015 and AICD Chronic systolic CHF -Appears euvolemic -Last echo 3/24 noted EF 25 to 30% with all apical segments akinetic with dyskinesia of the LV apex -AKI improving, restarted torsemide -Avoid SGLT2i with above bladder issues  COPD, without exacerbation Patient does not appear to be in acute exacerbation. -Continue home inhaler -Albuterol nebs as needed for shortness of breath/wheezing   Controlled diabetes mellitus type 2, without long-term use of insulin -A1 C is 7.2, CBGs are stable, SSI   Hypothyroidism -Continue levothyroxine   Hyperlipidemia -Continue Crestor   Obesity BMI 39.11 kg/m.   DVT prophylaxis: SCDs Advance Care Planning:   DNR   Consults: Urology   Family Communication: Discussed with son few days ago Disposition Plan: Home likely 1 to 2 days, she declines SNF  Consultants: Urology   Procedures:   Antimicrobials:    Objective: Vitals:   10/31/23 1934 11/01/23 0008 11/01/23 0500 11/01/23 0756  BP: (!) 127/52 (!) 114/48 (!) 123/47 (!) 115/45  Pulse: 74 72 77 72  Resp: 16 14 15 13   Temp: 97.6 F (36.4 C) 97.6 F (36.4 C) 98.4 F (36.9 C) 98.9 F (37.2 C)  TempSrc: Oral Oral Oral Oral  SpO2: 93%  100% 99% 100%  Weight:   96 kg     Intake/Output Summary (Last 24 hours) at 11/01/2023 1128 Last data filed at 11/01/2023  1007 Gross per 24 hour  Intake 340 ml  Output 1400 ml  Net -1060 ml   Filed Weights   10/30/23 0011 10/31/23 0003 11/01/23 0500  Weight: 96.5 kg 96.8 kg 96 kg    Examination:  Obese chronically ill female sitting up in bed, AAOx3 HEENT: Neck obese unable to assess JVD CVS: S1-S2, regular rhythm Lungs: Rare basilar Rales Abdomen: Soft, nontender, bowel sounds present Extremities: Trace edema, chronic skin changes    Data Reviewed:   CBC: Recent Labs  Lab 10/28/23 0224 10/29/23 0237 10/30/23 0321 10/31/23 0240 11/01/23 0221  WBC 8.6 8.0 8.0 7.4 8.2  HGB 8.7* 8.2* 7.9* 8.5* 7.6*  HCT 24.9* 24.0* 22.8* 26.0* 22.6*  MCV 88.6 89.6 89.4 92.5 91.1  PLT 174 155 159 193 194   Basic Metabolic Panel: Recent Labs  Lab 10/28/23 0224 10/29/23 0237 10/30/23 0321 10/31/23 0240 11/01/23 0221  NA 129* 132* 132* 135 130*  K 3.6 3.9 3.4* 4.2 3.8  CL 96* 99 101 100 97*  CO2 19* 21* 20* 24 24  GLUCOSE 135* 97 91 98 156*  BUN 92* 83* 73* 68* 63*  CREATININE 3.44* 2.98* 2.60* 2.24* 2.29*  CALCIUM 8.8* 8.6* 8.7* 9.2 8.7*   GFR: Estimated Creatinine Clearance: 20.1 mL/min (A) (by C-G formula based on SCr of 2.29 mg/dL (H)). Liver Function Tests: Recent Labs  Lab 10/28/23 0224 11/01/23 0221  AST 24 30  ALT 16 25  ALKPHOS 62 61  BILITOT 0.8 0.4  PROT 5.6* 5.2*  ALBUMIN 2.9* 2.6*   No results for input(s): "LIPASE", "AMYLASE" in the last 168 hours. No results for input(s): "AMMONIA" in the last 168 hours. Coagulation Profile: No results for input(s): "INR", "PROTIME" in the last 168 hours. Cardiac Enzymes: No results for input(s): "CKTOTAL", "CKMB", "CKMBINDEX", "TROPONINI" in the last 168 hours. BNP (last 3 results) No results for input(s): "PROBNP" in the last 8760 hours. HbA1C: No results for input(s): "HGBA1C" in the last 72 hours.  CBG: Recent Labs  Lab 10/31/23 1106 10/31/23 1502 10/31/23 2118 11/01/23 0610 11/01/23 1123  GLUCAP 199* 190* 174* 146* 149*    Lipid Profile: No results for input(s): "CHOL", "HDL", "LDLCALC", "TRIG", "CHOLHDL", "LDLDIRECT" in the last 72 hours. Thyroid Function Tests: No results for input(s): "TSH", "T4TOTAL", "FREET4", "T3FREE", "THYROIDAB" in the last 72 hours. Anemia Panel: No results for input(s): "VITAMINB12", "FOLATE", "FERRITIN", "TIBC", "IRON", "RETICCTPCT" in the last 72 hours.  Urine analysis:    Component Value Date/Time   COLORURINE RED (A) 10/26/2023 0844   APPEARANCEUR TURBID (A) 10/26/2023 0844   LABSPEC  10/26/2023 0844    TEST NOT REPORTED DUE TO COLOR INTERFERENCE OF URINE PIGMENT   PHURINE  10/26/2023 0844    TEST NOT REPORTED DUE TO COLOR INTERFERENCE OF URINE PIGMENT   GLUCOSEU (A) 10/26/2023 0844    TEST NOT REPORTED DUE TO COLOR INTERFERENCE OF URINE PIGMENT   HGBUR (A) 10/26/2023 0844    TEST NOT REPORTED DUE TO COLOR INTERFERENCE OF URINE PIGMENT   BILIRUBINUR (A) 10/26/2023 0844    TEST NOT REPORTED DUE TO COLOR INTERFERENCE OF URINE PIGMENT   KETONESUR (A) 10/26/2023 0844    TEST NOT REPORTED DUE TO COLOR INTERFERENCE OF URINE PIGMENT   PROTEINUR (A) 10/26/2023 0844    TEST NOT REPORTED DUE TO COLOR INTERFERENCE OF URINE PIGMENT  UROBILINOGEN 0.2 02/02/2014 1943   NITRITE (A) 10/26/2023 0844    TEST NOT REPORTED DUE TO COLOR INTERFERENCE OF URINE PIGMENT   LEUKOCYTESUR (A) 10/26/2023 0844    TEST NOT REPORTED DUE TO COLOR INTERFERENCE OF URINE PIGMENT   Sepsis Labs: @LABRCNTIP (procalcitonin:4,lacticidven:4)  ) Recent Results (from the past 240 hours)  Urine Culture (for pregnant, neutropenic or urologic patients or patients with an indwelling urinary catheter)     Status: Abnormal   Collection Time: 10/26/23  3:41 PM   Specimen: Urine, Clean Catch  Result Value Ref Range Status   Specimen Description URINE, CLEAN CATCH  Final   Special Requests   Final    NONE Performed at [facility], [address] [place], [place] [address]    Culture >=100,000  COLONIES/mL ESCHERICHIA COLI (A)  Final   Report Status 10/29/2023 FINAL  Final   Organism ID, Bacteria ESCHERICHIA COLI (A)  Final      Susceptibility   Escherichia coli - MIC*    AMPICILLIN >=32 RESISTANT Resistant     CEFAZOLIN >=64 RESISTANT Resistant     CEFEPIME 1 SENSITIVE Sensitive     CEFTRIAXONE >=64 RESISTANT Resistant     CIPROFLOXACIN <=0.25 SENSITIVE Sensitive     GENTAMICIN <=1 SENSITIVE Sensitive     IMIPENEM 1 SENSITIVE Sensitive     NITROFURANTOIN <=16 SENSITIVE Sensitive     TRIMETH/SULFA <=20 SENSITIVE Sensitive     AMPICILLIN/SULBACTAM >=32 RESISTANT Resistant     PIP/TAZO >=128 RESISTANT Resistant ug/mL    * >=100,000 COLONIES/mL ESCHERICHIA COLI     Radiology Studies: No results found.    Scheduled Meds:  Chlorhexidine Gluconate Cloth  6 each Topical Q0600   feeding supplement  237 mL Oral BID BM   ferrous sulfate  325 mg Oral Q M,W,F   insulin aspart  0-6 Units Subcutaneous TID WC   levothyroxine  112 mcg Oral QAC breakfast   polyethylene glycol  17 g Oral Daily   polyvinyl alcohol  2 drop Both Eyes BID   rosuvastatin  5 mg Oral Q M,W,F   sertraline  100 mg Oral Daily   sodium chloride flush  3 mL Intravenous Q12H   torsemide  20 mg Oral Daily   umeclidinium-vilanterol  1 puff Inhalation Daily   Continuous Infusions:  sodium chloride irrigation       LOS: 6 days    Time spent:    Zannie Cove, MD [facility]   11/01/2023, 11:28 AM

## 2023-11-01 NOTE — Plan of Care (Signed)
  Problem: Education: Goal: Knowledge of General Education information will improve Description: Including pain rating scale, medication(s)/side effects and non-pharmacologic comfort measures Outcome: Progressing   Problem: Clinical Measurements: Goal: Diagnostic test results will improve Outcome: Progressing   Problem: Clinical Measurements: Goal: Will remain free from infection Outcome: Progressing

## 2023-11-01 NOTE — Plan of Care (Signed)

## 2023-11-01 NOTE — Progress Notes (Signed)
 Patient's CBI was clamp overnight. No abdominal complaints. No blood clots noted. Day shift made aware.

## 2023-11-01 NOTE — TOC Progression Note (Addendum)
 Transition of Care [facility]) - Progression Note    Patient Details  Name: Elaine [name] MRN: [id number] Date of Birth: 05-11-1941  Transition of [facility]) CM/SW Contact  Michaela Corner, [place] Phone Number: 11/01/2023, 3:59 PM  Clinical Narrative:   CSW spoke with patient about SNF and provided medicare.gov rated bed list. Patient stated "I do not want to go to short term rehab." Patient clarified that her "play daughter" lives on the same block as her and her sons live with her. She also has other outside support. CSW notified RNCM.   4:14 PM SDOH needs reviewed and resources added to AVS.   TOC will continue to follow.    Expected Discharge Plan: Home w Home Health Services Barriers to Discharge: Continued Medical Work up  Expected Discharge Plan and Services   Discharge Planning Services: CM Consult Post Acute Care Choice: NA Living arrangements for the past 2 months: Apartment                 DME Arranged: Walker rolling with seat DME Agency: [employer] Date DME Agency Contacted: 10/30/23 Time DME Agency Contacted: 1627 Representative spoke with at DME Agency: Vaughan Basta HH Arranged: NA           Social Determinants of Health (SDOH) Interventions SDOH Screenings   Food Insecurity: No Food Insecurity (10/26/2023)  Housing: Low Risk  (10/30/2023)  Transportation Needs: No Transportation Needs (10/26/2023)  Utilities: At Risk (10/26/2023)  Depression (PHQ2-9): Low Risk  (10/04/2023)  Financial Resource Strain: Medium Risk (11/25/2019)  Social Connections: Moderately Isolated (10/30/2023)  Tobacco Use: Medium Risk (10/30/2023)    Readmission Risk Interventions    10/30/2023    4:22 PM  Readmission Risk Prevention Plan  Transportation Screening Complete  PCP or Specialist Appt within 5-7 Days Complete  Home Care Screening Complete  Medication Review (RN CM) Complete

## 2023-11-02 ENCOUNTER — Other Ambulatory Visit (HOSPITAL_COMMUNITY): Payer: Self-pay

## 2023-11-02 DIAGNOSIS — R310 Gross hematuria: Secondary | ICD-10-CM | POA: Diagnosis not present

## 2023-11-02 DIAGNOSIS — N184 Chronic kidney disease, stage 4 (severe): Secondary | ICD-10-CM | POA: Insufficient documentation

## 2023-11-02 LAB — CBC
HCT: 23.9 % — ABNORMAL LOW (ref 36.0–46.0)
Hemoglobin: 7.9 g/dL — ABNORMAL LOW (ref 12.0–15.0)
MCH: 29.9 pg (ref 26.0–34.0)
MCHC: 33.1 g/dL (ref 30.0–36.0)
MCV: 90.5 fL (ref 80.0–100.0)
Platelets: 209 10*3/uL (ref 150–400)
RBC: 2.64 MIL/uL — ABNORMAL LOW (ref 3.87–5.11)
RDW: 15.9 % — ABNORMAL HIGH (ref 11.5–15.5)
WBC: 7.9 10*3/uL (ref 4.0–10.5)
nRBC: 0 % (ref 0.0–0.2)

## 2023-11-02 LAB — BASIC METABOLIC PANEL
Anion gap: 7 (ref 5–15)
BUN: 70 mg/dL — ABNORMAL HIGH (ref 8–23)
CO2: 25 mmol/L (ref 22–32)
Calcium: 8.9 mg/dL (ref 8.9–10.3)
Chloride: 97 mmol/L — ABNORMAL LOW (ref 98–111)
Creatinine, Ser: 2.34 mg/dL — ABNORMAL HIGH (ref 0.44–1.00)
GFR, Estimated: 20 mL/min — ABNORMAL LOW (ref >60)
Glucose, Bld: 144 mg/dL — ABNORMAL HIGH (ref 70–99)
Potassium: 4 mmol/L (ref 3.5–5.1)
Sodium: 129 mmol/L — ABNORMAL LOW (ref 135–145)

## 2023-11-02 LAB — GLUCOSE, CAPILLARY
Glucose-Capillary: 126 mg/dL — ABNORMAL HIGH (ref 70–99)
Glucose-Capillary: 250 mg/dL — ABNORMAL HIGH (ref 70–99)

## 2023-11-02 MED ORDER — TORSEMIDE 20 MG PO TABS
20.0000 mg | ORAL_TABLET | Freq: Every day | ORAL | 0 refills | Status: DC
  Filled 2023-11-02: qty 30, 30d supply, fill #0

## 2023-11-02 MED ORDER — ISOSORB DINITRATE-HYDRALAZINE 20-37.5 MG PO TABS
ORAL_TABLET | ORAL | 1 refills | Status: DC
  Filled 2023-11-02: qty 45, 30d supply, fill #0

## 2023-11-02 NOTE — Progress Notes (Signed)
 Patient discharge home in stable condition via PTAR

## 2023-11-02 NOTE — Progress Notes (Signed)
 Physical Therapy Treatment Patient Details Name: Elaine [name] MRN: [id number] DOB: 1940-09-28 Today's Date: 11/02/2023   History of Present Illness Pt is an 83 y.o. F admitted 2/28 with inability to urinate and hematuria. Noted urinary retention early a.m. with severe lower abdominal pain,.daughter noted large blood clot and significant amount of blood in her diaper. Pt has frank clots, requiring  continuous bladder irrigation with suctioning. PMH significant of CAD, paroxysmal atrial fibrillation on Eliquis, ischemic cardiomyopathy s/p CABG and AICD, heart failure with reduced EF, COPD, hypothyroidism, diabetes mellitus type 2, CKD stage IV.    PT Comments  Pt pleasant and agreeable to PT session focused on gait training. She continues to demonstrate improper technique when using rollator and dismisses education to increase safety awareness. Pt requires CGA for transfer and gait. Limited session d/t arrival of transport. Pt will continue to benefit from acute skilled PT to increase her independence and safety with mobility to allow discharge.      If plan is discharge home, recommend the following: A little help with walking and/or transfers;A little help with bathing/dressing/bathroom;Assistance with cooking/housework;Assist for transportation   Can travel by private vehicle     Yes  Equipment Recommendations  None recommended by PT    Recommendations for Other Services       Precautions / Restrictions Precautions Precautions: Fall Recall of Precautions/Restrictions: Intact Precaution/Restrictions Comments: Pt is blind in her R eye. Restrictions Weight Bearing Restrictions Per Provider Order: No     Mobility  Bed Mobility               General bed mobility comments: Not observed. Pt greeted in recliner chair.    Transfers Overall transfer level: Needs assistance Equipment used: Rollator (4 wheels) Transfers: Sit to/from Stand Sit to Stand: Contact guard assist            General transfer comment: Pt takes increased time to achieve standing. Encouraged her to use momentum and increase fwd lean. Good eccentric control with sitting.    Ambulation/Gait Ambulation/Gait assistance: Contact guard assist Gait Distance (Feet): 10 Feet Assistive device: Rollator (4 wheels) Gait Pattern/deviations: Step-to pattern, Decreased weight shift to left, Decreased stance time - left, Decreased stride length Gait velocity: reduced Gait velocity interpretation: <1.31 ft/sec, indicative of household ambulator   General Gait Details: Pt ambulated from recliner chair around foot of bed to stretcher as transport arrived to take pt. Pt continues to demonstrated BUE support on front rollator bar and fwd trunk flex to support herself. She took short small steps favoring her R side d/t LLE weakness and pain.   Stairs             Wheelchair Mobility     Tilt Bed    Modified Rankin (Stroke Patients Only)       Balance Overall balance assessment: Needs assistance Sitting-balance support: Feet supported, Bilateral upper extremity supported Sitting balance-Leahy Scale: Fair Sitting balance - Comments: Pt seated in recliner chair able to scoot fwd with increased time, use of armrest, and supervision.   Standing balance support: Bilateral upper extremity supported, During functional activity, Reliant on assistive device for balance Standing balance-Leahy Scale: Poor Standing balance comment: Pt depent on rollator for support during static/dynamic tasks. She is unsteady, no overt LOB.                            Communication Communication Communication: No apparent difficulties  Cognition Arousal: Alert Behavior During  Therapy: WFL for tasks assessed/performed   PT - Cognitive impairments: Safety/Judgement                       PT - Cognition Comments: Pt has decreased safety awarness indicated by her technique when using rollator and  dismissal for seated rest even when she reports fatigue and "like my legs are going to give out." Following commands: Intact      Cueing Cueing Techniques: Verbal cues  Exercises      General Comments General comments (skin integrity, edema, etc.): Pt greeted on RA with SpO2 >95% with activity. Session limited as transport arrived to take pt.      Pertinent Vitals/Pain Pain Assessment Pain Assessment: Faces Faces Pain Scale: Hurts little more Pain Location: BLE Pain Descriptors / Indicators: Aching, Discomfort, Guarding Pain Intervention(s): Monitored during session, Limited activity within patient's tolerance    Home Living                          Prior Function            PT Goals (current goals can now be found in the care plan section) Acute Rehab PT Goals Patient Stated Goal: Go Home Progress towards PT goals: Progressing toward goals    Frequency    Min 2X/week      PT Plan      Co-evaluation              AM-PAC PT "6 Clicks" Mobility   Outcome Measure  Help needed turning from your back to your side while in a flat bed without using bedrails?: A Lot Help needed moving from lying on your back to sitting on the side of a flat bed without using bedrails?: A Lot Help needed moving to and from a bed to a chair (including a wheelchair)?: A Little Help needed standing up from a chair using your arms (e.g., wheelchair or bedside chair)?: A Little Help needed to walk in hospital room?: A Little Help needed climbing 3-5 steps with a railing? : Total 6 Click Score: 14    End of Session Equipment Utilized During Treatment: Gait belt Activity Tolerance: Other (comment);Patient limited by fatigue (Treatment limited secondary to arrival of EMS transport to d/c pt.) Patient left: Other (comment) (On stretcher with EMS personnel) Nurse Communication: Mobility status PT Visit Diagnosis: History of falling (Z91.81);Muscle weakness (generalized)  (M62.81);Difficulty in walking, not elsewhere classified (R26.2);Pain Pain - Right/Left: Left Pain - part of body: Leg;Ankle and joints of foot     Time: 1350-1400 PT Time Calculation (min) (ACUTE ONLY): 10 min  Charges:    $Gait Training: 8-22 mins PT General Charges $$ ACUTE PT VISIT: 1 Visit                     Cheri Guppy, PT, DPT Acute Rehabilitation Services Office: [phone] Secure Chat Preferred    Richardson Chiquito 11/02/2023, 2:37 PM

## 2023-11-02 NOTE — TOC Transition Note (Addendum)
 Transition of Care [facility]) - Discharge Note   Patient Details  Name: Elaine [name] MRN: [id number] Date of Birth: 10-May-1941  [facility]) CM/SW Contact:  Leone Haven, RN Phone Number: 11/02/2023, 12:09 PM   Clinical Narrative:    For dc home today, she states she will need ambulance transport.  She states her niece will be there at the home. NCM notified Kandee Keen with [facility] of dc today. Ptar scheduled.     Barriers to Discharge: Continued Medical Work up   Patient Goals and CMS Choice Patient states their goals for this hospitalization and ongoing recovery are:: return home   Choice offered to / list presented to : NA      Discharge Placement                       Discharge Plan and Services Additional resources added to the After Visit Summary for     Discharge Planning Services: CM Consult Post Acute Care Choice: NA          DME Arranged: Walker rolling with seat DME Agency: [employer] Date DME Agency Contacted: 10/30/23 Time DME Agency Contacted: 1627 Representative spoke with at DME Agency: Vaughan Basta HH Arranged: NA          Social Drivers of Health (SDOH) Interventions SDOH Screenings   Food Insecurity: No Food Insecurity (10/26/2023)  Housing: Low Risk  (10/30/2023)  Transportation Needs: No Transportation Needs (10/26/2023)  Utilities: At Risk (10/26/2023)  Depression (PHQ2-9): Low Risk  (10/04/2023)  Financial Resource Strain: Medium Risk (11/25/2019)  Social Connections: Moderately Isolated (10/30/2023)  Tobacco Use: Medium Risk (10/30/2023)     Readmission Risk Interventions    10/30/2023    4:22 PM  Readmission Risk Prevention Plan  Transportation Screening Complete  PCP or Specialist Appt within 5-7 Days Complete  Home Care Screening Complete  Medication Review (RN CM) Complete

## 2023-11-02 NOTE — TOC Transition Note (Deleted)
 Transition of Care [facility]) - Discharge Note   Patient Details  Name: Elaine [name] MRN: [id number] Date of Birth: 1940/11/25  Transition of Care [facility]) CM/SW Contact:  Leone Haven, RN Phone Number: 11/02/2023, 12:22 PM   Clinical Narrative:    For dc today, PTAR scheduled. Address confirmed.  NCM notified [place] with [place].      Barriers to Discharge: Continued Medical Work up   Patient Goals and CMS Choice Patient states their goals for this hospitalization and ongoing recovery are:: return home   Choice offered to / list presented to : NA      Discharge Placement                       Discharge Plan and Services Additional resources added to the After Visit Summary for     Discharge Planning Services: CM Consult Post Acute Care Choice: NA          DME Arranged: Walker rolling with seat DME Agency: [employer] Date DME Agency Contacted: 10/30/23 Time DME Agency Contacted: 1627 Representative spoke with at DME Agency: Vaughan Basta HH Arranged: NA          Social Drivers of Health (SDOH) Interventions SDOH Screenings   Food Insecurity: No Food Insecurity (10/26/2023)  Housing: Low Risk  (10/30/2023)  Transportation Needs: No Transportation Needs (10/26/2023)  Utilities: At Risk (10/26/2023)  Depression (PHQ2-9): Low Risk  (10/04/2023)  Financial Resource Strain: Medium Risk (11/25/2019)  Social Connections: Moderately Isolated (10/30/2023)  Tobacco Use: Medium Risk (10/30/2023)     Readmission Risk Interventions    10/30/2023    4:22 PM  Readmission Risk Prevention Plan  Transportation Screening Complete  PCP or Specialist Appt within 5-7 Days Complete  Home Care Screening Complete  Medication Review (RN CM) Complete

## 2023-11-02 NOTE — Progress Notes (Signed)
 Occupational Therapy Treatment Patient Details Name: Elaine [name] MRN: [id number] DOB: Mar 27, 1941 Today's Date: 11/02/2023   History of present illness Pt is an 83 y.o. F admitted 2/28 with inability to urinate and hematuria. Noted urinary retention early a.m. with severe lower abdominal pain,.daughter noted large blood clot and significant amount of blood in her diaper. Pt has frank clots, requiring  continuous bladder irrigation with suctioning. PMH significant of CAD, paroxysmal atrial fibrillation on Eliquis, ischemic cardiomyopathy s/p CABG and AICD, heart failure with reduced EF, COPD, hypothyroidism, diabetes mellitus type 2, CKD stage IV.   OT comments  Patient received in supine and agreeable to OT session. Patient able to get to EOB with CGA and increased time with HOB elevated. Patient states she does not have a hospital bed at home but her bed does have feature to raise HOB. Patient required max assist to donn socks seated on EOB with patient stating that her sons assists her at home. Patient performed UB bathing and dressing seated on EOB with min assist. Patient required min assist and 2 attempts to stand from EOB with bed raised and CGA to transfer to recliner with rollator. Patient performed one stand from recliner with CGA pushing up with armrests. Acute OT to continue to follow to address established goals to facilitate DC to next venue of care.       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 Restrictions Weight Bearing Restrictions Per Provider Order: No        Mobility Bed Mobility Overal bed mobility: Needs Assistance Bed Mobility: Supine to Sit     Supine to sit: HOB elevated, Used rails, Contact guard     General bed mobility comments: increased time and use of bed features    Transfers Overall transfer level: Needs assistance Equipment used: Rollator (4 wheels) Transfers: Sit to/from Stand, Bed to chair/wheelchair/BSC Sit to Stand: Min assist, From elevated surface     Step pivot transfers: Contact guard assist     General transfer comment: min assist from raised bed to stand and CGA to stand from recilner with use of arm rest     Balance Overall balance assessment: Needs assistance Sitting-balance support: Feet supported, Bilateral upper extremity supported Sitting balance-Leahy Scale: Fair Sitting balance - Comments: Pt sat EOB with supervision.   Standing balance support: Reliant on assistive device for balance, During functional activity Standing balance-Leahy Scale: Poor Standing balance comment: reliant on rollator for support when standing                           ADL either performed or assessed with clinical judgement   ADL Overall ADL's : Needs assistance/impaired     Grooming: Wash/dry hands;Wash/dry face;Set up;Sitting   Upper Body Bathing: Contact guard assist;Sitting Upper Body Bathing Details (indicate cue type and reason): on EOB     Upper Body Dressing : Minimal assistance;Sitting Upper Body Dressing Details (indicate cue type and reason): gown change Lower Body Dressing: Maximal assistance;Sitting/lateral leans Lower Body Dressing Details (indicate cue type and reason): to donn socks Toilet Transfer: Minimal assistance Toilet Transfer Details (indicate cue type and  reason): simulated           General ADL Comments: Patient states that her son assists her with donning socks but is able to perform self care normally, required min assist on this date    Extremity/Trunk Assessment               Vision       Perception     Praxis     Communication Communication Communication: No apparent difficulties   Cognition Arousal: Alert Behavior During Therapy: WFL for tasks assessed/performed Cognition: No apparent impairments                               Following commands: Intact        Cueing   Cueing Techniques: Verbal cues  Exercises      Shoulder Instructions       General Comments      Pertinent Vitals/ Pain       Pain Assessment Pain Assessment: Faces Faces Pain Scale: Hurts little more Pain Location: BLE Pain Descriptors / Indicators: Aching, Discomfort, Grimacing, Guarding Pain Intervention(s): Limited activity within patient's tolerance, Monitored during session, Repositioned  Home Living                                          Prior Functioning/Environment              Frequency  Min 1X/week        Progress Toward Goals  OT Goals(current goals can now be found in the care plan section)  Progress towards OT goals: Progressing toward goals  Acute Rehab OT Goals Patient Stated Goal: go home OT Goal Formulation: With patient Time For Goal Achievement: 11/13/23 Potential to Achieve Goals: Good ADL Goals Pt Will Perform Grooming: with supervision;standing Pt Will Perform Upper Body Dressing: with set-up;sitting Pt Will Perform Lower Body Dressing: sit to/from stand;with contact guard assist Pt Will Transfer to Toilet: with supervision;ambulating;regular height toilet  Plan      Co-evaluation                 AM-PAC OT "6 Clicks" Daily Activity     Outcome Measure   Help from another person eating meals?: None Help from another person taking care of personal grooming?: A Little Help from another person toileting, which includes using toliet, bedpan, or urinal?: A Little Help from another person bathing (including washing, rinsing, drying)?: A Lot Help from another person to  put on and taking off regular upper body clothing?: A Little Help from another person to put on and taking off regular lower body clothing?: A Lot 6 Click Score: 17    End of Session Equipment Utilized During Treatment: Rollator (4 wheels)  OT Visit Diagnosis: Unsteadiness on feet (R26.81);Muscle weakness (generalized) (M62.81)   Activity Tolerance Patient tolerated treatment well   Patient Left in chair;with call bell/phone within reach;with chair alarm set   Nurse Communication Mobility status        Time: 1610-9604 OT Time Calculation (min): 28 min  Charges: OT General Charges $OT Visit: 1 Visit OT Treatments $Self Care/Home Management : 23-37 mins  Alfonse Flavors, OTA Acute Rehabilitation Services  Office [phone]   Dewain Penning 11/02/2023, 1:50 PM

## 2023-11-02 NOTE — Discharge Summary (Addendum)
 Physician Discharge Summary  Elaine [name] XLK:[id number] DOB: [birth year] DOA: 10/26/2023  PCP: Cleatis Polka., MD  Admit date: 10/26/2023 Discharge date: 11/02/2023  Admitted From: Home Disposition:  Home with home health, patient declined SNF placement   Recommendations for Outpatient Follow-up:  Follow up with PCP Follow-up with urology for hemorrhagic cystitis and bladder outlet obstruction  Discharge Condition: Stable CODE STATUS: DNR Diet recommendation:  Diet Orders (From admission, onward)     Start     Ordered   11/02/23 0000  Diet - low sodium heart healthy        11/02/23 1158   10/28/23 0936  Diet Heart Room service appropriate? Yes; Fluid consistency: Thin  Diet effective now       Question Answer Comment  Room service appropriate? Yes   Fluid consistency: Thin      10/28/23 0935           Brief/Interim Summary: 83 y.o. female with medical history significant of CAD, paroxysmal atrial fibrillation on Eliquis, ischemic cardiomyopathy s/p CABG and AICD, heart failure with reduced EF, COPD, hypothyroidism, diabetes mellitus type 2, CKD stage IV who presented to the ED 2/20 with inability to urinate and hematuria.  Noted urinary retention early a.m. with severe lower abdominal pain, daughter noted large blood clot and significant amount of blood in her diaper. She has a clear preference against dialysis, influenced by her father's experiences with the treatment. She is a DNR and has expressed a strong desire not to undergo dialysis if it becomes necessary. -In the ED afebrile, vital signs stable, BC 11.5, hemoglobin 11.3, trended down to 9, potassium 6.2, CO2 16, creatinine 4.5.  Patient received 1 unit packed red blood cell transfusion on 2/28. -Urology was consulted and were able to place Foley catheter with three-way irrigation. -Having hematuria with frank clots, requiring continuous bladder irrigation with hand suctioning to remove clots for 2 to 3 days -3/4: CBI  clamped then restarted -3/5: clamped CBI from slow rate -3/7: Ready for discharge and to follow-up closely with outpatient urology.  Patient completed antibiotics.  Patient to continue to hold Eliquis for several weeks on discharge.  She was recommended for SNF placement, which she declined.  Discharge Diagnoses:   Principal Problem:   Hematuria Active Problems:   Acute kidney injury superimposed on chronic kidney disease (HCC)   Bladder outlet obstruction   Urinary tract infection   Hypotension   Permanent atrial fibrillation (HCC) - CHA2DS2-VASc Score 6, on Eliquis   Acute blood loss anemia   Hyperkalemia   Ischemic cardiomyopathy   S/P CABG x 3- 10/23/13   Heart failure with reduced ejection fraction (HCC)   COPD (chronic obstructive pulmonary disease) (HCC)   Controlled type 2 diabetes mellitus without complication, without long-term current use of insulin (HCC)   Hypothyroidism   Obesity   Hyperlipidemia with target LDL less than 70   Hemorrhagic cystitis   CKD (chronic kidney disease) stage 4, GFR 15-29 ml/min [facility])  Discharge Instructions  Discharge Instructions     (HEART FAILURE PATIENTS) Call MD:  Anytime you have any of the following symptoms: 1) 3 pound weight gain in 24 hours or 5 pounds in 1 week 2) shortness of breath, with or without a dry hacking cough 3) swelling in the hands, feet or stomach 4) if you have to sleep on extra pillows at night in order to breathe.   Complete by: As directed    Call MD for:  difficulty breathing, headache  or visual disturbances   Complete by: As directed    Call MD for:  extreme fatigue   Complete by: As directed    Call MD for:  hives   Complete by: As directed    Call MD for:  persistant dizziness or light-headedness   Complete by: As directed    Call MD for:  persistant nausea and vomiting   Complete by: As directed    Call MD for:  severe uncontrolled pain   Complete by: As directed    Call MD for:  temperature >100.4    Complete by: As directed    Diet - low sodium heart healthy   Complete by: As directed    Discharge instructions   Complete by: As directed    You were cared for by a hospitalist during your hospital stay. If you have any questions about your discharge medications or the care you received while you were in the hospital after you are discharged, you can call the unit and ask to speak with the hospitalist on call if the hospitalist that took care of you is not available. Once you are discharged, your primary care physician will handle any further medical issues. Please note that NO REFILLS for any discharge medications will be authorized once you are discharged, as it is imperative that you return to your primary care physician (or establish a relationship with a primary care physician if you do not have one) for your aftercare needs so that they can reassess your need for medications and monitor your lab values.   Increase activity slowly   Complete by: As directed    No wound care   Complete by: As directed       Allergies as of 11/02/2023       Reactions   Crestor [rosuvastatin Calcium] Other (See Comments)   Cramps- still taking    Allopurinol Hives   Cephalosporins Rash   *Tolerates ceftriaxone   Tape Rash   Adhesive tape        Medication List     STOP taking these medications    apixaban 2.5 MG Tabs tablet Commonly known as: ELIQUIS   potassium chloride SA 20 MEQ tablet Commonly known as: Klor-Con M20   spironolactone 25 MG tablet Commonly known as: ALDACTONE       TAKE these medications    acetaminophen 650 MG CR tablet Commonly known as: TYLENOL Take 1,300 mg by mouth 2 (two) times daily.   albuterol 108 (90 Base) MCG/ACT inhaler Commonly known as: VENTOLIN HFA Inhale 2 puffs into the lungs every 6 (six) hours as needed for wheezing or shortness of breath.   amoxicillin 500 MG capsule Commonly known as: AMOXIL Take 1 capsule (500 mg total) by mouth 2  (two) times daily.   Anoro Ellipta 62.5-25 MCG/INH Aepb Generic drug: umeclidinium-vilanterol Inhale 1 puff into the lungs daily.   colchicine 0.6 MG tablet Take 0.6 mg by mouth daily as needed (Gout).   CoQ-10 100 MG Caps Take 100 mg by mouth 2 (two) times daily.   Cranberry 250 MG Tabs Take 500 mg by mouth 2 (two) times daily.   Dulaglutide 1.5 MG/0.5ML Soaj Inject 1.5 mg into the skin once a week. Friday   ELDERBERRY PO Take 1 tablet by mouth 2 (two) times daily.   ezetimibe 10 MG tablet Commonly known as: ZETIA Take 10 mg by mouth daily.   ferrous sulfate 325 (65 FE) MG tablet Take 325 mg by mouth every  Monday, Wednesday, and Friday.   isosorbide-hydrALAZINE 20-37.5 MG tablet Commonly known as: BIDIL TAKE 1/2 (ONE-HALF) TABLET BY MOUTH THREE TIMES DAILY   levothyroxine 112 MCG tablet Commonly known as: Synthroid Take 1 tablet (112 mcg total) by mouth daily before breakfast.   metoprolol succinate 50 MG 24 hr tablet Commonly known as: TOPROL-XL Take 1 tablet (50 mg total) by mouth 2 (two) times daily. Take with or immediately following a meal.   PreserVision AREDS 2 Caps Take 1 capsule by mouth 2 (two) times daily.   MULTI FOR HER 50+ PO Take 1 tablet by mouth daily.   rosuvastatin 5 MG tablet Commonly known as: CRESTOR Take 5 mg by mouth every Monday, Wednesday, and Friday.   sertraline 100 MG tablet Commonly known as: ZOLOFT Take 100 mg by mouth daily.   Systane Balance 0.6 % Soln Generic drug: Propylene Glycol Place 1 drop into both eyes in the morning and at bedtime.   torsemide 20 MG tablet Commonly known as: DEMADEX Take 1 tablet (20 mg total) by mouth daily. Start taking on: November 03, 2023 What changed:  how much to take when to take this   Trelegy Ellipta 100-62.5-25 MCG/ACT Aepb Generic drug: Fluticasone-Umeclidin-Vilant Inhale 1 puff into the lungs daily.               Durable Medical Equipment  (From admission, onward)            Start     Ordered   10/30/23 1627  For home use only DME 4 wheeled rolling walker with seat  Once       Question:  Patient needs a walker to treat with the following condition  Answer:  Weakness   10/30/23 1627            Follow-up Information     Cleatis Polka., MD. Call.   Specialty: Internal Medicine Why: office closed at [facility] information: [address] [place] [address] [phone]         Care, [facility] Follow up.   Specialty: Home Health Services Why: Agency will call you with apt times Contact information: [address] [place] [address] [phone]         Esaw Dace, MD. Schedule an appointment as soon as possible for a visit.   Specialty: Urology Contact information: [address] [place] [address] [phone]                Allergies  Allergen Reactions   Crestor [Rosuvastatin Calcium] Other (See Comments)    Cramps- still taking    Allopurinol Hives   Cephalosporins Rash    *Tolerates ceftriaxone   Tape Rash    Adhesive tape    Consultations: Urology   Procedures/Studies: US PELVIS LIMITED (TRANSABDOMINAL ONLY) Result Date: 10/27/2023 CLINICAL DATA:  Gross hematuria. EXAM: LIMITED ULTRASOUND OF PELVIS TECHNIQUE: Limited transabdominal ultrasound examination of the pelvis was performed. COMPARISON:  CT 10/26/2023 FINDINGS: Foley catheter decompresses the urinary bladder. There is heterogeneous debris surrounding the catheter without vascularity. The bladder wall is suboptimally assessed. IMPRESSION: Foley catheter decompresses urinary bladder. Heterogeneous debris surrounding the catheter may be infection or blood products/clot. Electronically Signed   By: Narda Rutherford M.D.   On: 10/27/2023 19:47   DG CHEST PORT 1 VIEW Result Date: 10/26/2023 CLINICAL DATA:  Shortness of breath EXAM: PORTABLE CHEST 1 VIEW COMPARISON:  06/03/2022 FINDINGS: Cardiac shadow is  enlarged but stable. Postsurgical changes and  defibrillator are again identified and stable. The lungs are well aerated bilaterally. No focal infiltrate or effusion is seen. No bony abnormality is noted. IMPRESSION: No acute abnormality noted. Electronically Signed   By: Alcide Clever M.D.   On: 10/26/2023 21:24   CT Renal Stone Study Result Date: 10/26/2023 CLINICAL DATA:  Gross hematuria EXAM: CT ABDOMEN AND PELVIS WITHOUT CONTRAST TECHNIQUE: Multidetector CT imaging of the abdomen and pelvis was performed following the standard protocol without IV contrast. RADIATION DOSE REDUCTION: This exam was performed according to the departmental dose-optimization program which includes automated exposure control, adjustment of the mA and/or kV according to patient size and/or use of iterative reconstruction technique. COMPARISON:  CT 06/03/2022 and older. FINDINGS: Lower chest: Heart is enlarged. Status post median sternotomy. Defibrillator. Mild linear opacity at the bases likely scar or atelectasis. No pleural effusion. Hepatobiliary: On this non IV contrast exam there is no clear space-occupying liver lesion. The gallbladder is dilated. Please correlate with symptoms and follow-up ultrasound when appropriate. Pancreas: Moderate atrophy of the pancreas. Spleen: Normal in size without focal abnormality. Adrenals/Urinary Tract: Mild thickening of the adrenal glands. Bilateral renal atrophy identified. Small exophytic cyst along the anterior aspect of the right kidney is stable. There is moderate bilateral renal collecting system dilatation. No renal or ureteral stones. Dilatation extends down to the bladder. The bladder is dilated. There is a Foley catheter with luminal air. There is a large amount of high attenuation material in the bladder. This could represent hematoma with the patient's history of hematuria. This would obscure underlying mass lesion. Adjacent stranding and some wall thickening as well. Stomach/Bowel:  On this non oral contrast exam large bowel has a normal course and caliber with scattered colonic stool. Extensive colonic diverticula. The stomach and small bowel are nondilated as well. Vascular/Lymphatic: Extensive vascular calcifications along the aorta including along branch vessels such as the SMA, celiac. Please correlate with any symptoms. No discrete abnormal lymph node enlargement identified in the abdomen and pelvis. Reproductive: Status post hysterectomy. No adnexal masses. Other: No free air or free fluid. Musculoskeletal: Moderate degenerative changes of the spine and pelvis. Osteopenia. Portions of the pelvis are obscured by the significant streak artifact related to the patient's bowel hip arthroplasties. IMPRESSION: Bilateral renal atrophy.  No obstructing stones. Moderate bilateral renal collecting system dilatation. The bladder is distended with wall thickening and stranding. There is also a large amount of high attenuation material within the lumen of the bladder. Based on the appearance and the history this could represent a hematoma although underlying mass lesion is not excluded with the findings and lack of contrast. Recommend overall further evaluation when appropriate of the genitourinary systems when appropriate such as a contrast study or urologic evaluation. Foley catheter. Colonic diverticulosis. Dilated gallbladder. Please correlate with any symptoms and if needed follow up ultrasound as clinically appropriate. Enlarged heart.  Postop chest.  Defibrillator. Electronically Signed   By: Karen Kays M.D.   On: 10/26/2023 11:57      Discharge Exam: Vitals:   11/02/23 0745 11/02/23 1137  BP:  (!) 122/57  Pulse:  73  Resp:  19  Temp:  98.2 F (36.8 C)  SpO2: 100% 98%    General: Pt is alert, awake, not in acute distress Cardiovascular: RRR, S1/S2 +, no edema Respiratory: CTA bilaterally, no wheezing, no rhonchi, no respiratory distress, no conversational dyspnea   Abdominal: Soft, NT, ND, bowel sounds + Extremities: no edema, no cyanosis Psych: Normal mood and affect, stable  judgement and insight     The results of significant diagnostics from this hospitalization (including imaging, microbiology, ancillary and laboratory) are listed below for reference.     Microbiology: Recent Results (from the past 240 hours)  Urine Culture (for pregnant, neutropenic or urologic patients or patients with an indwelling urinary catheter)     Status: Abnormal   Collection Time: 10/26/23  3:41 PM   Specimen: Urine, Clean Catch  Result Value Ref Range Status   Specimen Description URINE, CLEAN CATCH  Final   Special Requests   Final    NONE Performed at [facility], [address] [place], [place] [address]    Culture >=100,000 COLONIES/mL ESCHERICHIA COLI (A)  Final   Report Status 10/29/2023 FINAL  Final   Organism ID, Bacteria ESCHERICHIA COLI (A)  Final      Susceptibility   Escherichia coli - MIC*    AMPICILLIN >=32 RESISTANT Resistant     CEFAZOLIN >=64 RESISTANT Resistant     CEFEPIME 1 SENSITIVE Sensitive     CEFTRIAXONE >=64 RESISTANT Resistant     CIPROFLOXACIN <=0.25 SENSITIVE Sensitive     GENTAMICIN <=1 SENSITIVE Sensitive     IMIPENEM 1 SENSITIVE Sensitive     NITROFURANTOIN <=16 SENSITIVE Sensitive     TRIMETH/SULFA <=20 SENSITIVE Sensitive     AMPICILLIN/SULBACTAM >=32 RESISTANT Resistant     PIP/TAZO >=128 RESISTANT Resistant ug/mL    * >=100,000 COLONIES/mL ESCHERICHIA COLI     Labs: BNP (last 3 results) Recent Labs    01/19/23 1045 07/23/23 1052 10/26/23 2225  BNP 487.9* 366.7* 217.9*   Basic Metabolic Panel: Recent Labs  Lab 10/29/23 0237 10/30/23 0321 10/31/23 0240 11/01/23 0221 11/02/23 0230  NA 132* 132* 135 130* 129*  K 3.9 3.4* 4.2 3.8 4.0  CL 99 101 100 97* 97*  CO2 21* 20* 24 24 25   GLUCOSE 97 91 98 156* 144*  BUN 83* 73* 68* 63* 70*  CREATININE 2.98* 2.60* 2.24* 2.29* 2.34*  CALCIUM 8.6* 8.7*  9.2 8.7* 8.9   Liver Function Tests: Recent Labs  Lab 10/28/23 0224 11/01/23 0221  AST 24 30  ALT 16 25  ALKPHOS 62 61  BILITOT 0.8 0.4  PROT 5.6* 5.2*  ALBUMIN 2.9* 2.6*   No results for input(s): "LIPASE", "AMYLASE" in the last 168 hours. No results for input(s): "AMMONIA" in the last 168 hours. CBC: Recent Labs  Lab 10/29/23 0237 10/30/23 0321 10/31/23 0240 11/01/23 0221 11/02/23 0230  WBC 8.0 8.0 7.4 8.2 7.9  HGB 8.2* 7.9* 8.5* 7.6* 7.9*  HCT 24.0* 22.8* 26.0* 22.6* 23.9*  MCV 89.6 89.4 92.5 91.1 90.5  PLT 155 159 193 194 209   Cardiac Enzymes: No results for input(s): "CKTOTAL", "CKMB", "CKMBINDEX", "TROPONINI" in the last 168 hours. BNP: Invalid input(s): "POCBNP" CBG: Recent Labs  Lab 11/01/23 1123 11/01/23 1626 11/01/23 2124 11/02/23 0527 11/02/23 1049  GLUCAP 149* 164* 210* 126* 250*   D-Dimer No results for input(s): "DDIMER" in the last 72 hours. Hgb A1c No results for input(s): "HGBA1C" in the last 72 hours. Lipid Profile No results for input(s): "CHOL", "HDL", "LDLCALC", "TRIG", "CHOLHDL", "LDLDIRECT" in the last 72 hours. Thyroid function studies No results for input(s): "TSH", "T4TOTAL", "T3FREE", "THYROIDAB" in the last 72 hours.  Invalid input(s): "FREET3" Anemia work up No results for input(s): "VITAMINB12", "FOLATE", "FERRITIN", "TIBC", "IRON", "RETICCTPCT" in the last 72 hours. Urinalysis    Component Value Date/Time   COLORURINE RED (A) 10/26/2023 1914  APPEARANCEUR TURBID (A) 10/26/2023 0844   LABSPEC  10/26/2023 0844    TEST NOT REPORTED DUE TO COLOR INTERFERENCE OF URINE PIGMENT   PHURINE  10/26/2023 0844    TEST NOT REPORTED DUE TO COLOR INTERFERENCE OF URINE PIGMENT   GLUCOSEU (A) 10/26/2023 0844    TEST NOT REPORTED DUE TO COLOR INTERFERENCE OF URINE PIGMENT   HGBUR (A) 10/26/2023 0844    TEST NOT REPORTED DUE TO COLOR INTERFERENCE OF URINE PIGMENT   BILIRUBINUR (A) 10/26/2023 0844    TEST NOT REPORTED DUE TO COLOR  INTERFERENCE OF URINE PIGMENT   KETONESUR (A) 10/26/2023 0844    TEST NOT REPORTED DUE TO COLOR INTERFERENCE OF URINE PIGMENT   PROTEINUR (A) 10/26/2023 0844    TEST NOT REPORTED DUE TO COLOR INTERFERENCE OF URINE PIGMENT   UROBILINOGEN 0.2 02/02/2014 1943   NITRITE (A) 10/26/2023 0844    TEST NOT REPORTED DUE TO COLOR INTERFERENCE OF URINE PIGMENT   LEUKOCYTESUR (A) 10/26/2023 0844    TEST NOT REPORTED DUE TO COLOR INTERFERENCE OF URINE PIGMENT   Sepsis Labs Recent Labs  Lab 10/30/23 0321 10/31/23 0240 11/01/23 0221 11/02/23 0230  WBC 8.0 7.4 8.2 7.9   Microbiology Recent Results (from the past 240 hours)  Urine Culture (for pregnant, neutropenic or urologic patients or patients with an indwelling urinary catheter)     Status: Abnormal   Collection Time: 10/26/23  3:41 PM   Specimen: Urine, Clean Catch  Result Value Ref Range Status   Specimen Description URINE, CLEAN CATCH  Final   Special Requests   Final    NONE Performed at [facility], [address] [place], [place] [address]    Culture >=100,000 COLONIES/mL ESCHERICHIA COLI (A)  Final   Report Status 10/29/2023 FINAL  Final   Organism ID, Bacteria ESCHERICHIA COLI (A)  Final      Susceptibility   Escherichia coli - MIC*    AMPICILLIN >=32 RESISTANT Resistant     CEFAZOLIN >=64 RESISTANT Resistant     CEFEPIME 1 SENSITIVE Sensitive     CEFTRIAXONE >=64 RESISTANT Resistant     CIPROFLOXACIN <=0.25 SENSITIVE Sensitive     GENTAMICIN <=1 SENSITIVE Sensitive     IMIPENEM 1 SENSITIVE Sensitive     NITROFURANTOIN <=16 SENSITIVE Sensitive     TRIMETH/SULFA <=20 SENSITIVE Sensitive     AMPICILLIN/SULBACTAM >=32 RESISTANT Resistant     PIP/TAZO >=128 RESISTANT Resistant ug/mL    * >=100,000 COLONIES/mL ESCHERICHIA COLI     Patient was seen and examined on the day of discharge and was found to be in stable condition. Time coordinating discharge: 40 minutes including assessment and coordination of care, as well  as examination of the patient.   SIGNED:  Noralee Stain, DO [facility] 11/02/2023, 1:27 PM

## 2023-11-07 ENCOUNTER — Ambulatory Visit (HOSPITAL_COMMUNITY)
Admit: 2023-11-07 | Discharge: 2023-11-07 | Disposition: A | Discharge status: Home / Self Care | Attending: Physician Assistant | Admitting: Physician Assistant

## 2023-11-07 ENCOUNTER — Encounter (HOSPITAL_COMMUNITY): Payer: Self-pay

## 2023-11-07 VITALS — BP 110/56 | HR 72

## 2023-11-07 DIAGNOSIS — N184 Chronic kidney disease, stage 4 (severe): Secondary | ICD-10-CM | POA: Insufficient documentation

## 2023-11-07 DIAGNOSIS — Z4502 Encounter for adjustment and management of automatic implantable cardiac defibrillator: Secondary | ICD-10-CM | POA: Diagnosis not present

## 2023-11-07 DIAGNOSIS — Z79899 Other long term (current) drug therapy: Secondary | ICD-10-CM | POA: Insufficient documentation

## 2023-11-07 DIAGNOSIS — I5022 Chronic systolic (congestive) heart failure: Secondary | ICD-10-CM | POA: Insufficient documentation

## 2023-11-07 DIAGNOSIS — N3091 Cystitis, unspecified with hematuria: Secondary | ICD-10-CM | POA: Diagnosis not present

## 2023-11-07 DIAGNOSIS — J449 Chronic obstructive pulmonary disease, unspecified: Secondary | ICD-10-CM | POA: Diagnosis not present

## 2023-11-07 DIAGNOSIS — Z87891 Personal history of nicotine dependence: Secondary | ICD-10-CM | POA: Diagnosis not present

## 2023-11-07 DIAGNOSIS — Z5986 Financial insecurity: Secondary | ICD-10-CM | POA: Diagnosis not present

## 2023-11-07 DIAGNOSIS — I079 Rheumatic tricuspid valve disease, unspecified: Secondary | ICD-10-CM

## 2023-11-07 DIAGNOSIS — I255 Ischemic cardiomyopathy: Secondary | ICD-10-CM | POA: Insufficient documentation

## 2023-11-07 DIAGNOSIS — Z951 Presence of aortocoronary bypass graft: Secondary | ICD-10-CM | POA: Insufficient documentation

## 2023-11-07 DIAGNOSIS — I2510 Atherosclerotic heart disease of native coronary artery without angina pectoris: Secondary | ICD-10-CM | POA: Insufficient documentation

## 2023-11-07 DIAGNOSIS — I4821 Permanent atrial fibrillation: Secondary | ICD-10-CM | POA: Diagnosis not present

## 2023-11-07 DIAGNOSIS — I482 Chronic atrial fibrillation, unspecified: Secondary | ICD-10-CM | POA: Insufficient documentation

## 2023-11-07 MED ORDER — TORSEMIDE 20 MG PO TABS: 80.0000 mg | ORAL_TABLET | Freq: Every day | ORAL | 3 refills | Status: DC

## 2023-11-07 MED ORDER — POTASSIUM CHLORIDE CRYS ER 20 MEQ PO TBCR: 40.0000 meq | EXTENDED_RELEASE_TABLET | Freq: Every day | ORAL | 3 refills | Status: DC

## 2023-11-07 NOTE — Progress Notes (Signed)
 PCP:  Martha Clan, MD  Nephrology: Dr. Signe Colt Primary Cardiologist:  Bryan Lemma, MD EP: Dr Linde Gillis HF Cardiologist: Dr. Shirlee Latch   HPI: Elaine [name] is a 83 y.o. with history of CAD s/p CABG, COPD, CKD stage 3, and chronic systolic systolic CHF/ischemic cardiomyopathy.  Patient was admitted in 2/15 with atrial fibrillation/RVR and chest pain.  Cath showed severe 3VD and she had CABG x 3.  Echo in 2/15 showed EF down to 20-25%. In 2018, it appears that she went back into atrial fibrillation.  DCCV was not attempted.   Echo in 10/20 showed EF < 20% with normal RV.  She had a St Jude CRT-D device implanted in 11/20.    She has been in atrial fibrillation persistently, attempted DCCV while on amiodarone but she did not convert.  Therefore, stopped amiodarone.    Echo in 3/21 showed EF 20% with diffuse hypokinesis, mildly dilated RV with mildly decreased systolic function, PASP 49 mmHg, dilated IVC.  In 9/21, she had AV nodal ablation.    She stopped empagliflozin due to UTIs and yeast infections.   Echo 12/22 EF 20-25%, mildly decreased RV function.   She was admitted in 10/23 with E faecalis UTI.  She was found to have T8/9 discitis/osteomyelitis.  TEE showed small TV vegetation that did not appear to involve her device.  Other findings were EF 30%, PASP 60 mmHg, mild MR, moderate-severe TR.  EP thought she was too high risk for device extraction.  She was treated with 6 wks of IV ampicillin and ceftriaxone.  She is now on amoxicillin for suppression.   Echo 3/24 EF 25-30%, mild LV dilation, mild RV dysfunction, mild MR, moderate TR, IVC dilated.   Admitted 02/28 with hemorrhagic cystitis, urinary retention and AKI. Scr peaked at 4.5. Required transfusion for anemia. Urology placed foley and had CBI to remove clots. Treated with IV ceftriaxone and ertrapenem for resistant E. Coli UTI. Recommended to hold Eliquis 3-4 weeks, spiro and KCL stopped. SNF recommended d/t debility which she  declined.  Today she returns for post hospital HF follow up with her family. Overall feeling fine. She is a little more SOB and swollen. Has foley, urine less red since discharge. Has urology follow up soon. Denies  palpitations, abnormal bleeding, CP, dizziness, or PND/Orthopnea. Appetite ok. No fever or chills.  Taking all medications, remains off Eliquis and spiro. Saw PCP today and had labs drawn.  ECG (personally reviewed): AF, V paced 73 bpm  Device interrogation (personally reviewed): CorVue elevated and daily impedence down suggesting hypervolemia, 98% BiV pacing, no VT/VF  Labs (3/25): K 4.0, creatinine 2.34, hgb 7.9  Social History   Socioeconomic History   Marital status: Widowed    Spouse name: Not on file   Number of children: Not on file   Years of education: Not on file   Highest education level: Not on file  Occupational History   Occupation: retired  Tobacco Use   Smoking status: Former    Current packs/day: 0.00    Average packs/day: 0.5 packs/day for 39.0 years (19.5 ttl pk-yrs)    Types: Cigarettes    Start date: 08/28/1954    Quit date: 08/28/1993    Years since quitting: 30.2   Smokeless tobacco: Never  Vaping Use   Vaping status: Never Used  Substance and Sexual Activity   Alcohol use: No    Comment: quit 95   Drug use: No   Sexual activity: Not on file  Other Topics Concern   Not on file  Social History Narrative   Lives with husband and son in [place].  She is currently retired.   Former smoker quit in 1995.   Social Drivers of Health   Financial Resource Strain: Medium Risk (11/25/2019)   Overall Financial Resource Strain (CARDIA)    Difficulty of Paying Living Expenses: Somewhat hard  Food Insecurity: No Food Insecurity (10/26/2023)   Hunger Vital Sign    Worried About Running Out of Food in the Last Year: Never true    Ran Out of Food in the Last Year: Never true  Transportation Needs: No Transportation Needs (10/26/2023)   PRAPARE -  Administrator, Civil Service (Medical): No    Lack of Transportation (Non-Medical): No  Physical Activity: Not on file  Stress: Not on file  Social Connections: Moderately Isolated (10/30/2023)   Social Connection and Isolation Panel [NHANES]    Frequency of Communication with Friends and Family: More than three times a week    Frequency of Social Gatherings with Friends and Family: Twice a week    Attends Religious Services: 1 to 4 times per year    Active Member of [employer] or Organizations: No    Attends Banker Meetings: Never    Marital Status: Widowed  Intimate Partner Violence: Not At Risk (10/26/2023)   Humiliation, Afraid, Rape, and Kick questionnaire    Fear of Current or Ex-Partner: No    Emotionally Abused: No    Physically Abused: No    Sexually Abused: No   Family History  Adopted: Yes  Problem Relation Age of Onset   Other Mother    Hypertension Mother    Colon cancer Neg Hx    Esophageal cancer Neg Hx    Rectal cancer Neg Hx    Stomach cancer Neg Hx    Current Outpatient Medications  Medication Sig Dispense Refill   acetaminophen (TYLENOL) 650 MG CR tablet Take 1,300 mg by mouth 2 (two) times daily.     albuterol (VENTOLIN HFA) 108 (90 Base) MCG/ACT inhaler Inhale 2 puffs into the lungs every 6 (six) hours as needed for wheezing or shortness of breath.     amoxicillin (AMOXIL) 500 MG capsule Take 1 capsule (500 mg total) by mouth 2 (two) times daily. 60 capsule 11   Coenzyme Q10 (COQ-10) 100 MG CAPS Take 100 mg by mouth 2 (two) times daily.     colchicine 0.6 MG tablet Take 0.6 mg by mouth daily as needed (Gout).      Cranberry 250 MG TABS Take 500 mg by mouth 2 (two) times daily.     Dulaglutide 1.5 MG/0.5ML SOAJ Inject 1.5 mg into the skin once a week. Friday     ELDERBERRY PO Take 1 tablet by mouth 2 (two) times daily.     ezetimibe (ZETIA) 10 MG tablet Take 10 mg by mouth daily.     ferrous sulfate 325 (65 FE) MG tablet Take 325 mg by  mouth every Monday, Wednesday, and Friday.     isosorbide-hydrALAZINE (BIDIL) 20-37.5 MG tablet TAKE 1/2 (ONE-HALF) TABLET BY MOUTH THREE TIMES DAILY 45 tablet 1   levothyroxine (SYNTHROID) 112 MCG tablet Take 1 tablet (112 mcg total) by mouth daily before breakfast. 30 tablet 12   metoprolol succinate (TOPROL-XL) 50 MG 24 hr tablet Take 1 tablet (50 mg total) by mouth 2 (two) times daily. Take with or immediately following a meal. 180 tablet 2   Multiple Vitamins-Minerals (  MULTI FOR HER 50+ PO) Take 1 tablet by mouth daily.     Multiple Vitamins-Minerals (PRESERVISION AREDS 2) CAPS Take 1 capsule by mouth 2 (two) times daily.     Propylene Glycol (SYSTANE BALANCE) 0.6 % SOLN Place 1 drop into both eyes in the morning and at bedtime.     rosuvastatin (CRESTOR) 5 MG tablet Take 5 mg by mouth every Monday, Wednesday, and Friday.     sertraline (ZOLOFT) 100 MG tablet Take 100 mg by mouth daily.     torsemide (DEMADEX) 20 MG tablet Take 1 tablet (20 mg total) by mouth daily. 30 tablet 0   TRELEGY ELLIPTA 100-62.5-25 MCG/ACT AEPB Inhale 1 puff into the lungs daily.     umeclidinium-vilanterol (ANORO ELLIPTA) 62.5-25 MCG/INH AEPB Inhale 1 puff into the lungs daily.     No current facility-administered medications for this encounter.   Wt Readings from Last 3 Encounters:  11/02/23 97.4 kg (214 lb 11.7 oz)  10/04/23 97.6 kg (215 lb 3.2 oz)  08/28/23 97.6 kg (215 lb 3.2 oz)   BP (!) 110/56   Pulse 72   SpO2 98%   Physical Exam General:  NAD. No resp difficulty, arrived in [facility] HEENT: Normal Neck: Supple. JVP to ear Cor: Regular rate & rhythm. No rubs, gallops or murmurs. Lungs: Clear, faint crackles in LLL Abdomen: Soft, nontender, nondistended + foley, urine brown colored Extremities: No cyanosis, clubbing, rash, 1-2+ BLE edema Neuro: Alert & oriented x 3, moves all 4 extremities w/o difficulty. Affect pleasant.  Assessment/Plan: 1. Chronic systolic CHF: Ischemic cardiomyopathy.  St Jude  CRT-D device, now s/p AV nodal ablation.  Echo in 3/21 showed EF 20% with diffuse hypokinesis, mildly dilated RV with mildly decreased systolic function, PASP 49 mmHg, dilated IVC.  Echo 12/22 showed EF 20-25%, mildly decreased RV function.  TEE in 10/23 with EF 30%, PASP 60 mmHg, mild MR, moderate-severe TR, small TV vegetation.  Echo 3/24 showed  EF 25-30%, mild LV dilation, mild RV dysfunction, mild MR, moderate TR. NYHA class III symptoms chronically.  She is volume overloaded on exam and by CoreVue. - Increase torsemide to 80 mg daily, add 40 KCL daily (previously on 80 bid torsemide). Had labs at PCP, will call and request these - Continue spironolactone 25 mg daily.  - Continue BiDil 0.5 tab tid (unable to tolerate Bidil 1 tab tid). - Continue Toprol XL 50 mg bid.    - Plan to add spiro back eventually. - Off empagliflozin due to yeast infection and UTI.  - Will ask device RN to send interrogation in 1 week  2. CAD: S/p CABG in 2015. No chest pain. - No ASA given apixaban use.  - Continue Crestor 5 qod + Zetia.  3. Atrial fibrillation: Chronic now, looks like it has gone on since 2018.  Unable to cardiovert her on amiodarone so it was stopped. She is now s/p AVN ablation to allow more BiV pacing. Rate controlled.   - Off Eliquis d/t recent hemorrhagic cystitis - Per Urology, plan to add back in 3-4 more weeks.  4. CKD Stage 4: Most recent creatinine 2.34  5. 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  Also with T8/9 discitis/osteomyelitis.  - She remains on suppressive amoxicillin per ID.   6. Hemorrhagic cystitis: required CBI during admission. Foley remains, urine is brown-colored today. She has follow up with Urology soon. Hopeful we can add back Eliquis in 3-4 more weeks - Follow CBC  Follow up 2 weeks with  APP for fluid check  Jacklynn Ganong, [place]  11/07/2023

## 2023-11-07 NOTE — Patient Instructions (Signed)
 Medication Changes:  INCREASE TORSEMIDE TO 80MG  DAILY (4 TABLETS) ONCE DAILY   TAKE OF POTASSIUM DAILY   Follow-Up in: 2 WEEKS AS SCHEDULED   At the [facility], you and your health needs are our priority. We have a designated team specialized in the treatment of Heart Failure. This Care Team includes your primary Heart Failure Specialized Cardiologist (physician), Advanced Practice Providers (APPs- Physician Assistants and Nurse Practitioners), and Pharmacist who all work together to provide you with the care you need, when you need it.   You may see any of the following providers on your designated Care Team at your next follow up:  Dr. Arvilla Meres Dr. Marca Ancona Dr. Dorthula Nettles Dr. Theresia Bough Tonye Becket, NP Robbie Lis, [place] [facility] [place], [place] Brynda Peon, NP [place] Lee, NP Karle Plumber, PharmD   Please be sure to bring in all your medications bottles to every appointment.   Need to Contact us:  If you have any questions or concerns before your next appointment please send [place] a message through [place] or call our office at [phone].    TO LEAVE A MESSAGE FOR THE NURSE SELECT OPTION 2, PLEASE LEAVE A MESSAGE INCLUDING: YOUR NAME DATE OF BIRTH CALL BACK NUMBER REASON FOR CALL**this is important as we prioritize the call backs  YOU WILL RECEIVE A CALL BACK THE SAME DAY AS LONG AS YOU CALL BEFORE 4:00 PM

## 2023-11-08 NOTE — Progress Notes (Signed)
 Remote ICD transmission.

## 2023-11-08 NOTE — Progress Notes (Signed)
 No ICM remote transmission received for 11/05/2023 and next ICM transmission scheduled for 11/13/2023.

## 2023-11-08 NOTE — Addendum Note (Signed)
 Addended by: Geralyn Flash D on: 11/08/2023 01:08 PM   Modules accepted: Orders

## 2023-11-09 ENCOUNTER — Ambulatory Visit: Payer: 59

## 2023-11-13 ENCOUNTER — Encounter

## 2023-11-13 ENCOUNTER — Telehealth: Payer: Self-pay

## 2023-11-13 NOTE — Telephone Encounter (Signed)
 ICM call to patient to request remote transmission.  Advised monitor is disconnected.  She was in the hospital and discharged on 3/7 with Foley catheter which was removed on 3/17.  She is voiding on her own since catheter removal.  Pt does not know how to use the phone app monitor for her device.  She is not sure if she has the app on her phone and will need to call a friend to come to her house to assist her but not sure if that will be today.  She is limited with mobility due to general weakness following a recent hospitalization.  Advised to have her friend to locate the my merlin app on her phone and contact the representative to assist with connection.  ICM remote transmission rescheduled for 3/31.  Message sent to Prince Rome, NP at [facility] that unable to recheck fluid levels following last OV.

## 2023-11-19 ENCOUNTER — Telehealth (HOSPITAL_COMMUNITY): Payer: Self-pay

## 2023-11-19 ENCOUNTER — Ambulatory Visit: Payer: 59 | Attending: Cardiology | Admitting: Cardiology

## 2023-11-19 VITALS — BP 110/58 | HR 72 | Ht 62.0 in | Wt 216.0 lb

## 2023-11-19 DIAGNOSIS — E785 Hyperlipidemia, unspecified: Secondary | ICD-10-CM

## 2023-11-19 DIAGNOSIS — I4821 Permanent atrial fibrillation: Secondary | ICD-10-CM

## 2023-11-19 DIAGNOSIS — I2510 Atherosclerotic heart disease of native coronary artery without angina pectoris: Secondary | ICD-10-CM

## 2023-11-19 DIAGNOSIS — E1169 Type 2 diabetes mellitus with other specified complication: Secondary | ICD-10-CM

## 2023-11-19 DIAGNOSIS — I5042 Chronic combined systolic (congestive) and diastolic (congestive) heart failure: Secondary | ICD-10-CM | POA: Diagnosis not present

## 2023-11-19 NOTE — Patient Instructions (Signed)
 Medication Instructions:  No changes  *If you need a refill on your cardiac medications before your next appointment, please call your pharmacy*   Lab Work:   Not needed   Testing/Procedures:  Not needed  Follow-Up: At [facility], you and your health needs are our priority.  As part of our continuing mission to provide you with exceptional heart care, we have created designated Provider Care Teams.  These Care Teams include your primary Cardiologist (physician) and Advanced Practice Providers (APPs -  Physician Assistants and Nurse Practitioners) who all work together to provide you with the care you need, when you need it.     Your next appointment:   12 month(s)  The format for your next appointment:   In Person  Provider:   Bryan Lemma, MD    Other Instructions   s

## 2023-11-19 NOTE — Telephone Encounter (Signed)
 Called to confirm/remind patient of their appointment at the [facility] on 11/20/23 at 12pm.   Appointment:   [x] Confirmed    Patient reminded to bring all medications and/or complete list.  Confirmed patient has transportation. Gave directions, instructed to utilize valet parking.

## 2023-11-19 NOTE — Progress Notes (Unsigned)
 Cardiology Office Note:  .   Date:  11/20/2023  ID:  Elaine [name], DOB [birth year], MRN [id number] PCP: Cleatis Polka., MD  [facility] Providers Cardiologist:  Bryan Lemma, MD Electrophysiologist:  Will Jorja Loa, MD  Advanced Heart Failure:  Marca Ancona, MD     Chief Complaint  Patient presents with   Follow-up    3 follow-up with primary cardiologist.   Coronary Artery Disease    No angina   Atrial Fibrillation    No sensation of A-fib   Congestive Heart Failure    Maybe limited volume overload but controlled.    Patient Profile: Elaine [name] Kitchen     Elaine [name] is a very pleasant 83 y.o. female with a complex PMH reviewed below who presents here for delayed "Primary/Interventional Cardiology " follow-up at the request of Cleatis Polka., MD.  CAD-s/p CABG x3 ICM with chronic combined CHF; s/p BiV ICD EF by Echo anywhere from 20 to 30%. History of A-fib (RVR) => s/p AVN ablation to allow for more consistent BiV pacing Unable to cardiovert despite amiodarone S/p LAA clip during CABG Underlying LBBB CKD 4 (most recent creatinine 2.34) DM-2,  HLD  HTN-however now blood pressures are relatively low COPD      I last saw Elaine [name] in January 2022-there is not a lot since I last saw her.  Her most recent visit with EP was in December 2024 with Canary Brim, NP => device functioning properly.  Pacing wires clear of vegetation.  Elaine [name] was admitted in October 2023 for for Enterococcus faecalis UTI complicated by T8/9 discitis/osteomyelitis with potential small tricuspid valve vegetation.  Treated with long-term IV ampicillin and ceftriaxone => on amoxicillin for suppression.  She was recently admitted in February 2025 for hemorrhagic cystitis related to E. coli UTI with bleeding related to Eliquis.  Eliquis was held.Plan was to hold Eliquis for 3 to 4 weeks and spironolactone and potassium were stopped.  She declined SNF on discharge.   Subjective  Elaine [name] returns for primary cardiology follow-up after having just been seen by the heart failure clinic on March 12 as a hospital follow-up from her February 28 admission.   At the follow-up appointment she was feeling pretty well.  Not really having that much CHF symptoms.  Maybe a little more short of breath and swollen since discharge no real PND orthopnea.  Core view was elevated suggesting hypervolemia.  She has close follow-up planned.  Torsemide was increased to 80 mg daily along with KCl with repeat labs planned. One half tab BiDil and Toprol 50 mg twice daily were continued and spironolactone 25 mg restarted Echo 2 inhibitor held due to yeast infection and UTI. Amiodarone admitted to be discontinued as they were unable to cardiovert A-fib => eventually had AVN ablation to allow for BiV pacing.  Elaine [name] returns today for routine follow-up overall feeling quite well.  She says her edema may be a little bit worse than usual but overall she is not having any chest pain or pressure.  She does not sleep lying down-sleeps in a chair but has not noted any worsening orthopnea or PND.  She is not very active with what she does she does not notice profound exertional dyspnea or chest discomfort.  She has no sensation of irregular heartbeats or palpitations-especially not a tachycardic symptoms.  She has some positional dizziness and lightheadedness but no syncope or near syncope.  No TIA or amaurosis fugax.  Does not walk enough to necessarily note claudication No further hematuria in the Foley catheter is out.  She is happy about that.  She is hoping that she can have a recheck with urology and be cleared to potentially restart Eliquis.  No fevers or chills.  No real coughing or wheezing.  No major GI issues.     Objective   Pertinent Cardiac Medications: On maintenance amoxicillin 500 mg twice daily Lipids: Zetia 10 mL and daily, co-Q10 100 mg twice daily, rosuvastatin 5 mg Monday Wednesday  Friday CHF/HTN: BiDil 20-37.5 mg tabs (1/2  tab 3 times daily); Toprol 50 mg twice daily; Demadex 80 mg daily; Klor-Con 20 mEq-2 tabs daily; dulaglutide 1.5 mg injection weekly; ferrous sulfate 325 mg Monday Wednesday Friday Pulmonary: Albuterol inhaler every 6 hours as needed, Trelegy Ellipta 100-16 0.5-25 1 puff daily; Anoro Ellipta 62.5-25 1 puff daily Synthroid 112 mcg daily; Zoloft 100 mg daily  Studies Reviewed: Elaine [name] Kitchen        ECHO 11/01/2022: EF 25 to 30%.  Severely increased function with apical akinesis/dyskinesis.  Mid to apical septal and mid apical anterior walls are akinetic.  Hypokinetic mid inferior and lateral walls.  Mildly dilated cavity.  Abnormal strain.  PAP estimated 47 mL mercury with moderately elevated pressures.  Moderate LA and mild RA dilation-RAP estimated 15 mmHg..  Moderate TR.  AOV sclerosis with no stenosis. Echo (2/15): EF 20-25%. - Echo (5/15): EF 40-45% Echo (10/20): EF <20%, mild LVH, normal RV size and systolic function.  Echo (3/'21): EF 20% with diffuse hypokinesis, mildly dilated RV with mildly decreased systolic function, PASP 49 mmHg, dilated IVC. Echo 10/23: EF 30%.  Global HK.  No vegetation noted on pacing wires.  Severely elevated PAP with RSP estimated at 60 mmHg.  Moderately dilated LA with no thrombus.  Mildly dilated RV and RA.  Small subcentimeter TV vegetation with moderate to severe regurgitation.=  Labs 04/12/2023: TC 134, TG 53, HDL 65, LDL 58. 10/26/2023-A1c 7.28 October 2023: Hgb 7.9, CR 2.34; K5.1.  Risk Assessment/Calculations:    CHA2DS2-VASc Score = 7   This indicates a 11.2% annual risk of stroke. The patient's score is based upon: CHF History: 1 HTN History: 1 Diabetes History: 1 Stroke History: 0 Vascular Disease History: 1 Age Score: 2 Gender Score: 1   DOAC on hold          Physical Exam:   VS:  BP (!) 110/58 (BP Location: Left Arm, Patient Position: Sitting, Cuff Size: Large)   Pulse 72   Ht 5\' 2"  (1.575 m)   Wt 216 lb (98  kg)   SpO2 98%   BMI 39.51 kg/m    Wt Readings from Last 3 Encounters:  11/20/23 224 lb 3.2 oz (101.7 kg)  11/19/23 216 lb (98 kg)  11/02/23 214 lb 11.7 oz (97.4 kg)    GEN: Pleasant elderly woman-in wheelchair.  Chronically ill but nontoxic.  NAD.  Obese.  Well-groomed.  She is in her usual jovial laughing mood. HEENT: JVD 8 cm water with mild HJR; No carotid bruits 7: Right eye "prostate over "from prior corneal transplant CARDIAC: Irregular rhythm with regular rate.  1/6 SEM at RUSB.  Split s2 with soft S1-both are distant; RESPIRATORY: Mild diffuse interstitial sounds but otherwise clear to auscultation without rales,or rhonchi; faint wheezing.; nonlabored, good air movement. ABDOMEN: Soft, non-tender, non-distended EXTREMITIES: Trivial edema; No deformity ; wheelchair-bound.  Legs are weak.     ASSESSMENT AND PLAN: .    Problem  List Items Addressed This Visit       Cardiology Problems   Chronic combined systolic and diastolic CHF, NYHA class 2 (HCC) - Primary (Chronic)   Has been managed closely by advanced heart failure service.  With close follow-up scheduled, plan will be to continue current medicines without any changes.  She is currently on once daily 80 mg Demadex.  Supposedly spironolactone was restarted but I do not see it listed on her meds. Is on BiDil and Toprol as noted with stable blood pressures.  No room to titrate further. He is on SGLT2 inhibitor.  Follow-up with advanced heart failure service      Hyperlipidemia associated with type 2 diabetes mellitus (HCC) (Chronic)   A1c not quite well-controlled will defer to primary service/endocrinology Lipids well-controlled as of August 2024 with an LDL of 58.   -Continue 3 times a week rosuvastatin 5 mg daily along with Zetia 10 mg daily.      LM/MD CAD status post CABG (Chronic)   Has not really ever had any issues with anginal symptoms in the last couple years.  Remains on BiDil and Toprol for blood pressure  and afterload reduction with some antianginal benefit. Given advanced age, would hold off on any ischemic evaluation especially since her EF remains low but she has not had any angina.   Lipids well-controlled as of fall 2024 - Continue Toprol XL 50 mg twice daily, BiDil 1/2 tab (of 20-37.5 mg) TID - Continue low-dose rosuvastatin 5 mg 3 days a week along with Zetia and co-Q10. -Last A1c was 7.2 on current diabetes regimen.  Defer to endocrinology.       Permanent atrial fibrillation (HCC) - CHA2DS2-VASc Score 6, on Eliquis (Chronic)   Essentially rate controlled with Toprol 50 mg twice daily.  BiV ICD in place to allow for pacing with the AV node ablation.  Relatively stable. -Continue current dose of Toprol XL 50 mg twice daily  -Restart Eliquis when cleared by urology (however we could consider the possibility of actually stopping it since she has had LAA clip however the LA still remains significantly dilated.        Other   Morbid obesity- BMI 39-40 (Chronic)   Morbidly obese with profound generalized weakness.  Essentially wheelchair-bound with leg and back weakness. She has lost weight since I previously seen her but is put on weight over the last couple days.  Monitor closely and adjust diuretic based on weight.        Follow-Up: Return in about 1 year (around 11/18/2024) for Routine follow up with me, [employer].     Signed, Marykay Lex, MD, MS Bryan Lemma, M.D., M.S. Interventional Cardiologist  [facility]  Pager # [phone] Phone # [phone] [address] [place], [place] [address]

## 2023-11-20 ENCOUNTER — Encounter: Payer: Self-pay | Admitting: Cardiology

## 2023-11-20 ENCOUNTER — Other Ambulatory Visit (HOSPITAL_COMMUNITY): Payer: Self-pay | Admitting: Cardiology

## 2023-11-20 ENCOUNTER — Encounter (HOSPITAL_COMMUNITY): Payer: Self-pay

## 2023-11-20 ENCOUNTER — Ambulatory Visit (HOSPITAL_COMMUNITY)
Admission: RE | Admit: 2023-11-20 | Discharge: 2023-11-20 | Disposition: A | Discharge status: Home / Self Care | Source: Ambulatory Visit | Attending: Family Medicine | Admitting: Family Medicine

## 2023-11-20 VITALS — BP 110/60 | HR 73 | Ht 62.0 in | Wt 224.2 lb

## 2023-11-20 DIAGNOSIS — I2510 Atherosclerotic heart disease of native coronary artery without angina pectoris: Secondary | ICD-10-CM | POA: Insufficient documentation

## 2023-11-20 DIAGNOSIS — Z79899 Other long term (current) drug therapy: Secondary | ICD-10-CM | POA: Diagnosis not present

## 2023-11-20 DIAGNOSIS — N3091 Cystitis, unspecified with hematuria: Secondary | ICD-10-CM

## 2023-11-20 DIAGNOSIS — I4821 Permanent atrial fibrillation: Secondary | ICD-10-CM

## 2023-11-20 DIAGNOSIS — I482 Chronic atrial fibrillation, unspecified: Secondary | ICD-10-CM | POA: Insufficient documentation

## 2023-11-20 DIAGNOSIS — Z951 Presence of aortocoronary bypass graft: Secondary | ICD-10-CM | POA: Diagnosis not present

## 2023-11-20 DIAGNOSIS — N184 Chronic kidney disease, stage 4 (severe): Secondary | ICD-10-CM | POA: Diagnosis not present

## 2023-11-20 DIAGNOSIS — I5022 Chronic systolic (congestive) heart failure: Secondary | ICD-10-CM | POA: Diagnosis present

## 2023-11-20 DIAGNOSIS — I5042 Chronic combined systolic (congestive) and diastolic (congestive) heart failure: Secondary | ICD-10-CM

## 2023-11-20 DIAGNOSIS — J449 Chronic obstructive pulmonary disease, unspecified: Secondary | ICD-10-CM | POA: Insufficient documentation

## 2023-11-20 DIAGNOSIS — B952 Enterococcus as the cause of diseases classified elsewhere: Secondary | ICD-10-CM | POA: Diagnosis not present

## 2023-11-20 DIAGNOSIS — I079 Rheumatic tricuspid valve disease, unspecified: Secondary | ICD-10-CM | POA: Insufficient documentation

## 2023-11-20 DIAGNOSIS — I255 Ischemic cardiomyopathy: Secondary | ICD-10-CM | POA: Insufficient documentation

## 2023-11-20 LAB — BASIC METABOLIC PANEL
Anion gap: 11 (ref 5–15)
BUN: 40 mg/dL — ABNORMAL HIGH (ref 8–23)
CO2: 22 mmol/L (ref 22–32)
Calcium: 9.5 mg/dL (ref 8.9–10.3)
Chloride: 106 mmol/L (ref 98–111)
Creatinine, Ser: 2.34 mg/dL — ABNORMAL HIGH (ref 0.44–1.00)
GFR, Estimated: 20 mL/min — ABNORMAL LOW (ref >60)
Glucose, Bld: 131 mg/dL — ABNORMAL HIGH (ref 70–99)
Potassium: 5.4 mmol/L — ABNORMAL HIGH (ref 3.5–5.1)
Sodium: 139 mmol/L (ref 135–145)

## 2023-11-20 LAB — CBC
HCT: 29.6 % — ABNORMAL LOW (ref 36.0–46.0)
Hemoglobin: 9.4 g/dL — ABNORMAL LOW (ref 12.0–15.0)
MCH: 29 pg (ref 26.0–34.0)
MCHC: 31.8 g/dL (ref 30.0–36.0)
MCV: 91.4 fL (ref 80.0–100.0)
Platelets: 273 10*3/uL (ref 150–400)
RBC: 3.24 MIL/uL — ABNORMAL LOW (ref 3.87–5.11)
RDW: 16.7 % — ABNORMAL HIGH (ref 11.5–15.5)
WBC: 5.8 10*3/uL (ref 4.0–10.5)
nRBC: 0 % (ref 0.0–0.2)

## 2023-11-20 LAB — BRAIN NATRIURETIC PEPTIDE: B Natriuretic Peptide: 585.8 pg/mL — ABNORMAL HIGH (ref 0.0–100.0)

## 2023-11-20 MED ORDER — POTASSIUM CHLORIDE CRYS ER 20 MEQ PO TBCR: 60.0000 meq | EXTENDED_RELEASE_TABLET | Freq: Every day | ORAL | 3 refills | Status: DC

## 2023-11-20 MED ORDER — TORSEMIDE 20 MG PO TABS
80.0000 mg | ORAL_TABLET | Freq: Two times a day (BID) | ORAL | 11 refills | Status: AC
Start: 2023-11-20 — End: ?

## 2023-11-20 NOTE — Progress Notes (Signed)
 PCP:  Martha Clan, MD  Nephrology: Dr. Signe Colt Primary Cardiologist:  Bryan Lemma, MD EP: Dr Linde Gillis HF Cardiologist: Dr. Shirlee Latch   HPI: Elaine [name] is a 83 y.o. with history of CAD s/p CABG, COPD, CKD stage 3, and chronic systolic systolic CHF/ischemic cardiomyopathy.  Patient was admitted in 2/15 with atrial fibrillation/RVR and chest pain.  Cath showed severe 3VD and she had CABG x 3.  Echo in 2/15 showed EF down to 20-25%. In 2018, it appears that she went back into atrial fibrillation.  DCCV was not attempted.   Echo in 10/20 showed EF < 20% with normal RV.  She had a St Jude CRT-D device implanted in 11/20.    She has been in atrial fibrillation persistently, attempted DCCV while on amiodarone but she did not convert.  Therefore, stopped amiodarone.    Echo in 3/21 showed EF 20% with diffuse hypokinesis, mildly dilated RV with mildly decreased systolic function, PASP 49 mmHg, dilated IVC.  In 9/21, she had AV nodal ablation.    She stopped empagliflozin due to UTIs and yeast infections.   Echo 12/22 EF 20-25%, mildly decreased RV function.   She was admitted in 10/23 with E faecalis UTI.  She was found to have T8/9 discitis/osteomyelitis.  TEE showed small TV vegetation that did not appear to involve her device.  Other findings were EF 30%, PASP 60 mmHg, mild MR, moderate-severe TR.  EP thought she was too high risk for device extraction.  She was treated with 6 wks of IV ampicillin and ceftriaxone.  She is now on amoxicillin for suppression.   Echo 3/24 EF 25-30%, mild LV dilation, mild RV dysfunction, mild MR, moderate TR, IVC dilated.   Admitted 02/28 with hemorrhagic cystitis, urinary retention and AKI. Scr peaked at 4.5. Required transfusion for anemia. Urology placed foley and had CBI to remove clots. Treated with IV ceftriaxone and ertrapenem for resistant E. Coli UTI. Recommended to hold Eliquis 3-4 weeks, spiro and KCL stopped. SNF recommended d/t debility which she  declined.  Today she returns for HF follow up with her family. Overall feeling fine. She did not notice increase in urination after increase in torsemide last visit. She is working with PT and is not SOB with this. She is generally not physically active, uses a WC when outside of the house. Legs are swelling. Denies palpitations, abnormal bleeding, CP, dizziness, or PND/Orthopnea. Appetite ok. No fever or chills. Not weighing at home. Taking all medications. Foley now out, urine is clear yellow.  ECG (personally reviewed): none ordered today.  Device interrogation (personally reviewed): CorVue remains elevated and daily impedence down suggesting hypervolemia, 98% BiV pacing, no VT/VF  Labs (3/25): K 4.0, creatinine 2.34, hgb 7.9  Social History   Socioeconomic History   Marital status: Widowed    Spouse name: Not on file   Number of children: Not on file   Years of education: Not on file   Highest education level: Not on file  Occupational History   Occupation: retired  Tobacco Use   Smoking status: Former    Current packs/day: 0.00    Average packs/day: 0.5 packs/day for 39.0 years (19.5 ttl pk-yrs)    Types: Cigarettes    Start date: 08/28/1954    Quit date: 08/28/1993    Years since quitting: 30.2   Smokeless tobacco: Never  Vaping Use   Vaping status: Never Used  Substance and Sexual Activity   Alcohol use: No    Comment:  quit 89   Drug use: No   Sexual activity: Not on file  Other Topics Concern   Not on file  Social History Narrative   Lives with husband and son in [place].  She is currently retired.   Former smoker quit in 1995.   Social Drivers of Health   Financial Resource Strain: Medium Risk (11/25/2019)   Overall Financial Resource Strain (CARDIA)    Difficulty of Paying Living Expenses: Somewhat hard  Food Insecurity: No Food Insecurity (10/26/2023)   Hunger Vital Sign    Worried About Running Out of Food in the Last Year: Never true    Ran Out of Food in  the Last Year: Never true  Transportation Needs: No Transportation Needs (10/26/2023)   PRAPARE - Administrator, Civil Service (Medical): No    Lack of Transportation (Non-Medical): No  Physical Activity: Not on file  Stress: Not on file  Social Connections: Moderately Isolated (10/30/2023)   Social Connection and Isolation Panel [NHANES]    Frequency of Communication with Friends and Family: More than three times a week    Frequency of Social Gatherings with Friends and Family: Twice a week    Attends Religious Services: 1 to 4 times per year    Active Member of [employer] or Organizations: No    Attends Banker Meetings: Never    Marital Status: Widowed  Intimate Partner Violence: Not At Risk (10/26/2023)   Humiliation, Afraid, Rape, and Kick questionnaire    Fear of Current or Ex-Partner: No    Emotionally Abused: No    Physically Abused: No    Sexually Abused: No   Family History  Adopted: Yes  Problem Relation Age of Onset   Other Mother    Hypertension Mother    Colon cancer Neg Hx    Esophageal cancer Neg Hx    Rectal cancer Neg Hx    Stomach cancer Neg Hx    Current Outpatient Medications  Medication Sig Dispense Refill   acetaminophen (TYLENOL) 650 MG CR tablet Take 1,300 mg by mouth 2 (two) times daily.     albuterol (VENTOLIN HFA) 108 (90 Base) MCG/ACT inhaler Inhale 2 puffs into the lungs every 6 (six) hours as needed for wheezing or shortness of breath.     amoxicillin (AMOXIL) 500 MG capsule Take 1 capsule (500 mg total) by mouth 2 (two) times daily. 60 capsule 11   Coenzyme Q10 (COQ-10) 100 MG CAPS Take 100 mg by mouth 2 (two) times daily.     colchicine 0.6 MG tablet Take 0.6 mg by mouth daily as needed (Gout).      Cranberry 250 MG TABS Take 500 mg by mouth 2 (two) times daily.     Dulaglutide 1.5 MG/0.5ML SOAJ Inject 1.5 mg into the skin once a week. Friday     ELDERBERRY PO Take 1 tablet by mouth 2 (two) times daily.     ezetimibe (ZETIA)  10 MG tablet Take 10 mg by mouth daily.     ferrous sulfate 325 (65 FE) MG tablet Take 325 mg by mouth every Monday, Wednesday, and Friday.     isosorbide-hydrALAZINE (BIDIL) 20-37.5 MG tablet TAKE 1/2 (ONE-HALF) TABLET BY MOUTH THREE TIMES DAILY 45 tablet 1   levothyroxine (SYNTHROID) 112 MCG tablet Take 1 tablet (112 mcg total) by mouth daily before breakfast. 30 tablet 12   metoprolol succinate (TOPROL-XL) 50 MG 24 hr tablet Take 1 tablet (50 mg total) by mouth 2 (two) times  daily. Take with or immediately following a meal. 180 tablet 2   Multiple Vitamins-Minerals (MULTI FOR HER 50+ PO) Take 1 tablet by mouth daily.     Multiple Vitamins-Minerals (PRESERVISION AREDS 2) CAPS Take 1 capsule by mouth 2 (two) times daily.     Propylene Glycol (SYSTANE BALANCE) 0.6 % SOLN Place 1 drop into both eyes in the morning and at bedtime.     rosuvastatin (CRESTOR) 5 MG tablet Take 5 mg by mouth every Monday, Wednesday, and Friday.     sertraline (ZOLOFT) 100 MG tablet Take 100 mg by mouth daily.     TRELEGY ELLIPTA 100-62.5-25 MCG/ACT AEPB Inhale 1 puff into the lungs daily.     umeclidinium-vilanterol (ANORO ELLIPTA) 62.5-25 MCG/INH AEPB Inhale 1 puff into the lungs daily.     potassium chloride SA (KLOR-CON M) 20 MEQ tablet Take 3 tablets (60 mEq total) by mouth daily. 270 tablet 3   torsemide (DEMADEX) 20 MG tablet Take 4 tablets (80 mg total) by mouth 2 (two) times daily. 240 tablet 11   No current facility-administered medications for this encounter.   Wt Readings from Last 3 Encounters:  11/20/23 101.7 kg (224 lb 3.2 oz)  11/19/23 98 kg (216 lb)  11/02/23 97.4 kg (214 lb 11.7 oz)   BP 110/60   Pulse 73   Ht 5\' 2"  (1.575 m)   Wt 101.7 kg (224 lb 3.2 oz)   SpO2 98%   BMI 41.01 kg/m   Physical Exam General:  NAD. No resp difficulty, arrived in [facility], elderly HEENT: Normal Neck: Supple. JVP to jaw Cor: Regular rate & rhythm. No rubs, gallops or murmurs. Lungs: Clear, diminished in  bases Abdomen: Soft, nontender, nondistended.  Extremities: No cyanosis, clubbing, rash, 2+ BLE edema Neuro: Alert & oriented x 3, moves all 4 extremities w/o difficulty. Affect pleasant.  Assessment/Plan: 1. Chronic systolic CHF: Ischemic cardiomyopathy.  St Jude CRT-D device, now s/p AV nodal ablation.  Echo in 3/21 showed EF 20% with diffuse hypokinesis, mildly dilated RV with mildly decreased systolic function, PASP 49 mmHg, dilated IVC.  Echo 12/22 showed EF 20-25%, mildly decreased RV function.  TEE in 10/23 with EF 30%, PASP 60 mmHg, mild MR, moderate-severe TR, small TV vegetation.  Echo 3/24 showed  EF 25-30%, mild LV dilation, mild RV dysfunction, mild MR, moderate TR. NYHA class III symptoms chronically.  She is volume overloaded on exam and by CoreVue. - Increase torsemide to 80 mg bid, increase KCL to 60 daily. BMET and BNP today - Continue BiDil 0.5 tab tid (unable to tolerate Bidil 1 tab tid). - Continue Toprol XL 50 mg bid.    - Plan to add spiro back eventually. - Off empagliflozin due to yeast infection and UTI.  - Repeat labs at follow up  2. CAD: S/p CABG in 2015. No chest pain. - No ASA given apixaban use.  - Continue Crestor 5 qod + Zetia.  3. Atrial fibrillation: Chronic now, looks like it has gone on since 2018.  Unable to cardiovert her on amiodarone so it was stopped. She is now s/p AVN ablation to allow more BiV pacing. Rate controlled.   - Off Eliquis d/t recent hemorrhagic cystitis - Per Urology, plan to add back in 1-2 more weeks. - CBC today.  4. CKD Stage 4: Most recent creatinine 2.34. BMET today.  5. 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  Also with T8/9 discitis/osteomyelitis.  - She remains on suppressive amoxicillin per ID.   6.  Hemorrhagic cystitis: required CBI during admission. Foley is now discontinued, urine is clear yellow per [name]'s report. She has follow up with Urology soon. Hopeful we can add back Eliquis in 1-2 more weeks - Follow  CBC  Follow up in 1-2 weeks with APP for fluid check (will need BMET, add back Eliquis).  Anderson [place], [place]  11/20/2023

## 2023-11-20 NOTE — Assessment & Plan Note (Addendum)
 Essentially rate controlled with Toprol 50 mg twice daily.  BiV ICD in place to allow for pacing with the AV node ablation.  Relatively stable. -Continue current dose of Toprol XL 50 mg twice daily  -Restart Eliquis when cleared by urology (however we could consider the possibility of actually stopping it since she has had LAA clip however the LA still remains significantly dilated.

## 2023-11-20 NOTE — Addendum Note (Signed)
 Encounter addended by: Faythe Casa, CMA on: 11/20/2023 2:24 PM  Actions taken: Visit diagnoses modified, Order list changed, Diagnosis association updated, Flowsheet accepted, Clinical Note Signed, Charge Capture section accepted

## 2023-11-20 NOTE — Assessment & Plan Note (Signed)
 Has not really ever had any issues with anginal symptoms in the last couple years.  Remains on BiDil and Toprol for blood pressure and afterload reduction with some antianginal benefit. Given advanced age, would hold off on any ischemic evaluation especially since her EF remains low but she has not had any angina.   Lipids well-controlled as of fall 2024 - Continue Toprol XL 50 mg twice daily, BiDil 1/2 tab (of 20-37.5 mg) TID - Continue low-dose rosuvastatin 5 mg 3 days a week along with Zetia and co-Q10. -Last A1c was 7.2 on current diabetes regimen.  Defer to endocrinology.

## 2023-11-20 NOTE — Assessment & Plan Note (Signed)
 Morbidly obese with profound generalized weakness.  Essentially wheelchair-bound with leg and back weakness. She has lost weight since I previously seen her but is put on weight over the last couple days.  Monitor closely and adjust diuretic based on weight.

## 2023-11-20 NOTE — Progress Notes (Signed)
 ReDS Vest / Clip - 11/20/23 1400       ReDS Vest / Clip   Station Marker A    Ruler Value 25    ReDS Value Range Low volume    ReDS Actual Value 35

## 2023-11-20 NOTE — Assessment & Plan Note (Signed)
 A1c not quite well-controlled will defer to primary service/endocrinology Lipids well-controlled as of August 2024 with an LDL of 58.   -Continue 3 times a week rosuvastatin 5 mg daily along with Zetia 10 mg daily.

## 2023-11-20 NOTE — Patient Instructions (Signed)
 Increase torsemide to 80 mg twice daily - updated Rx sent. Increase potassium to 60 meq daily - updated Rx sent. Labs today - will call you if abnormal. Return to Heart Failure APP clinic in 2 weeks - see below. Please call us at [phone] if any questions or concerns prior to your next visit.

## 2023-11-20 NOTE — Assessment & Plan Note (Signed)
 Has been managed closely by advanced heart failure service.  With close follow-up scheduled, plan will be to continue current medicines without any changes.  She is currently on once daily 80 mg Demadex.  Supposedly spironolactone was restarted but I do not see it listed on her meds. Is on BiDil and Toprol as noted with stable blood pressures.  No room to titrate further. He is on SGLT2 inhibitor.  Follow-up with advanced heart failure service

## 2023-11-21 ENCOUNTER — Telehealth (HOSPITAL_COMMUNITY): Payer: Self-pay

## 2023-11-21 ENCOUNTER — Encounter: Payer: Self-pay | Admitting: Cardiology

## 2023-11-21 DIAGNOSIS — I5022 Chronic systolic (congestive) heart failure: Secondary | ICD-10-CM

## 2023-11-21 DIAGNOSIS — I5042 Chronic combined systolic (congestive) and diastolic (congestive) heart failure: Secondary | ICD-10-CM

## 2023-11-21 MED ORDER — POTASSIUM CHLORIDE CRYS ER 20 MEQ PO TBCR: 40.0000 meq | EXTENDED_RELEASE_TABLET | Freq: Every day | ORAL | 3 refills | Status: AC

## 2023-11-21 NOTE — Telephone Encounter (Signed)
 Patient's labs placed and appointment scheduled. In addition pt's kcl has been changed to 40 meq daily and pt's chart updated. Pt aware, agreeable, and verbalized understanding.

## 2023-11-21 NOTE — Telephone Encounter (Signed)
-----   Message from Jacklynn Ganong sent at 11/20/2023  2:16 PM EDT ----- K too high.  Hold KCL x 3 days, then reduce to 40 daily. Needs BMET on Monday

## 2023-11-23 ENCOUNTER — Ambulatory Visit

## 2023-11-26 ENCOUNTER — Other Ambulatory Visit (HOSPITAL_COMMUNITY)

## 2023-11-26 ENCOUNTER — Ambulatory Visit: Attending: Cardiology

## 2023-11-26 DIAGNOSIS — Z95810 Presence of automatic (implantable) cardiac defibrillator: Secondary | ICD-10-CM

## 2023-11-26 DIAGNOSIS — I5022 Chronic systolic (congestive) heart failure: Secondary | ICD-10-CM

## 2023-11-28 NOTE — Progress Notes (Signed)
 EPIC Encounter for ICM Monitoring  Patient Name: Elaine [name] is a 83 y.o. female Date: 11/28/2023 Primary Care Physican: Cleatis Polka., MD Primary Cardiologist: Harding/McLean Electrophysiologist: Kathreen Cornfield Pacing: 98% 04/26/2022 Weight: 228 lbs 11/01/2022 Office Weight: 223 lbs 02/20/2023 Weight: 218 lbs 05/11/2023 Office Weight: 214 lbs      07/16/2023 Weight:  215 lbs    10/09/2023 Weight: 206 lbs      11/20/2023 Office Weight: 224 lbs 11/28/2023 Weight: 216 lbs                                             Spoke with patient and heart failure questions reviewed.  Transmission results reviewed.  Pt reports she is feeling better and feels like fluid has resolved.  She remains weak and is using wheelchair at home.       CorVue thoracic impedance suggesting possible fluid accumulation from 3/1-3/28 and returned to normal, 3/28, after Torsemide increased back to 80 mg bid (was 20 mg daily at 3/7 hospital discharge) at 3/25 HF clinic visit.     Prescribed:   Torsemide 20 mg take 4 tablets (80 mg total) by mouth twice a day.  Confirmed 4/2 she is taking as prescribed.  Potassium 20 mEq take 2 tablets (40 mEq total) by mouth daily Spironolactone 25 mg take 1 tablet (25 mg total) by mouth daily.   Labs: 11/20/2023 Creatinine 2.34, BUN 40, Potassium 5.4, Sodium 139, GFR 20 (HF clinic addressed Potassium), BNP 585.8 11/02/2023 Creatinine 2.34, BUN 70, Potassium 4.0, Sodium 129, GFR 20  11/01/2023 Creatinine 2.29, BUN 63, Potassium 3.8, Sodium 130, GFR 21  10/31/2023 Creatinine 2.24, BUN 68, Potassium 4.2, Sodium 135, GFR 21 10/30/2023 Creatinine 2.60, BUN 73, Potassium 3.4, Sodium 132, GFR 18  10/29/2023 Creatinine 2.98, BUN 83, Potassium 3.9, Sodium 132, GFR 15  10/28/2023 Creatinine 3.44, BUN 92, Potassium 3.6, Sodium 129, GFR 13  A complete set of results can be found in Results Review.   Recommendations:  No changes and encouraged to call if experiencing any fluid symptoms.    Follow-up plan: ICM clinic phone appointment on 12/17/2023 to recheck fluid levels after HF clinic visit.   91 day device clinic remote transmission 01/01/2024.     EP/Cardiology Office Visits:  12/11/2023 with HF Clinic.  01/18/2024 with Francis Dowse, PA.   Next appt to be scheduled 11/18/2024 with Dr Herbie Baltimore (no recall for 1 yr f/u).     Copy of ICM check sent to Dr. Elberta Fortis.   3 month ICM trend: 11/25/2023.    12-14 Month ICM trend:     Karie Soda, RN 11/28/2023 8:37 AM

## 2023-11-29 ENCOUNTER — Other Ambulatory Visit (HOSPITAL_COMMUNITY): Payer: Self-pay | Admitting: Cardiology

## 2023-11-29 MED ORDER — METOPROLOL SUCCINATE ER 50 MG PO TB24: 50.0000 mg | ORAL_TABLET | Freq: Two times a day (BID) | ORAL | 2 refills | Status: DC

## 2023-12-05 ENCOUNTER — Other Ambulatory Visit (HOSPITAL_COMMUNITY): Payer: Self-pay | Admitting: Cardiology

## 2023-12-10 ENCOUNTER — Telehealth (HOSPITAL_COMMUNITY): Payer: Self-pay

## 2023-12-10 NOTE — Telephone Encounter (Signed)
 Called to confirm/remind patient of their appointment at the [facility] on 12/11/23.   Appointment:   [] Confirmed  [x] Left mess   [] No answer/No voice mail And to bring in all medications and/or complete med list

## 2023-12-11 ENCOUNTER — Inpatient Hospital Stay (HOSPITAL_COMMUNITY)
Admission: RE | Admit: 2023-12-11 | Discharge: 2023-12-11 | Disposition: A | Discharge status: Home / Self Care | Source: Ambulatory Visit

## 2023-12-17 ENCOUNTER — Ambulatory Visit: Attending: Cardiology

## 2023-12-17 DIAGNOSIS — Z95810 Presence of automatic (implantable) cardiac defibrillator: Secondary | ICD-10-CM

## 2023-12-17 DIAGNOSIS — I5022 Chronic systolic (congestive) heart failure: Secondary | ICD-10-CM

## 2023-12-18 NOTE — Progress Notes (Signed)
 EPIC Encounter for ICM Monitoring  Patient Name: Elaine [name] is a 83 y.o. female Date: 12/18/2023 Primary Care Physican: Jeannine Milroy., MD Primary Cardiologist: Harding/McLean Electrophysiologist: Morrell Aran Pacing: 98% 04/26/2022 Weight: 228 lbs 11/01/2022 Office Weight: 223 lbs 02/20/2023 Weight: 218 lbs 05/11/2023 Office Weight: 214 lbs      07/16/2023 Weight:  215 lbs    10/09/2023 Weight: 206 lbs      11/20/2023 Office Weight: 224 lbs 11/28/2023 Weight: 216 lbs                                             Transmission results reviewed.         CorVue thoracic impedance suggesting fluid levels returned to normal.     Prescribed:   Torsemide  20 mg take 4 tablets (80 mg total) by mouth twice a day.  Confirmed 4/2 she is taking as prescribed.  Potassium 20 mEq take 2 tablets (40 mEq total) by mouth daily Spironolactone  25 mg take 1 tablet (25 mg total) by mouth daily.   Labs: 11/20/2023 Creatinine 2.34, BUN 40, Potassium 5.4, Sodium 139, GFR 20 (HF clinic addressed Potassium), BNP 585.8 11/02/2023 Creatinine 2.34, BUN 70, Potassium 4.0, Sodium 129, GFR 20  11/01/2023 Creatinine 2.29, BUN 63, Potassium 3.8, Sodium 130, GFR 21  10/31/2023 Creatinine 2.24, BUN 68, Potassium 4.2, Sodium 135, GFR 21 10/30/2023 Creatinine 2.60, BUN 73, Potassium 3.4, Sodium 132, GFR 18  10/29/2023 Creatinine 2.98, BUN 83, Potassium 3.9, Sodium 132, GFR 15  10/28/2023 Creatinine 3.44, BUN 92, Potassium 3.6, Sodium 129, GFR 13  A complete set of results can be found in Results Review.   Recommendations:  No changes.   Follow-up plan: ICM clinic phone appointment on 01/02/2024.   91 day device clinic remote transmission 01/01/2024.     EP/Cardiology Office Visits:  01/01/2024 with HF Clinic.  01/18/2024 with Mertha Abrahams, PA.   Next appt to be scheduled 11/18/2024 with Dr Addie Holstein (no recall for 1 yr f/u).     Copy of ICM check sent to Dr. Lawana Pray.    3 month ICM trend: 12/17/2023.    12-14 Month ICM  trend:     Almyra Jain, RN 12/18/2023 12:59 PM

## 2023-12-24 ENCOUNTER — Ambulatory Visit
Admission: RE | Admit: 2023-12-24 | Discharge: 2023-12-24 | Disposition: A | Discharge status: Home / Self Care | Source: Ambulatory Visit | Attending: Internal Medicine | Admitting: Internal Medicine

## 2023-12-24 DIAGNOSIS — Z1231 Encounter for screening mammogram for malignant neoplasm of breast: Secondary | ICD-10-CM

## 2023-12-31 ENCOUNTER — Telehealth (HOSPITAL_COMMUNITY): Payer: Self-pay

## 2023-12-31 NOTE — Telephone Encounter (Signed)
 Called to confirm/remind patient of their appointment at the [facility] on 01/01/24.   Appointment:   [x] Confirmed  [] Left mess   [] No answer/No voice mail  [] VM Full/unable to leave message  [] Phone not in service  Patient reminded to bring all medications and/or complete list.  Confirmed patient has transportation. Gave directions, instructed to utilize valet parking.

## 2023-12-31 NOTE — Progress Notes (Signed)
 ADVANCED HF CLINIC NOTE  PCP:  Pecolia Bourbon, MD  Nephrology: Dr. Christianne Cowper Primary Cardiologist:  Randene Bustard, MD EP: Dr Benson Brawn HF Cardiologist: Dr. Mitzie Anda   Reason for Visit: Heart Failure Follow-up HPI: TRESS MOLTER is a 83 y.o. with history of CAD s/p CABG, COPD, CKD stage 3, and chronic systolic systolic CHF/ischemic cardiomyopathy.  Patient was admitted in 2/15 with atrial fibrillation/RVR and chest pain.  Cath showed severe 3VD and she had CABG x 3.  Echo in 2/15 showed EF down to 20-25%. In 2018, it appears that she went back into atrial fibrillation.  DCCV was not attempted.   Echo in 10/20 showed EF < 20% with normal RV.  She had a St Jude CRT-D device implanted in 11/20.    She has been in atrial fibrillation persistently, attempted DCCV while on amiodarone  but she did not convert.  Therefore, stopped amiodarone .    Echo in 3/21 showed EF 20% with diffuse hypokinesis, mildly dilated RV with mildly decreased systolic function, PASP 49 mmHg, dilated IVC.  In 9/21, she had AV nodal ablation.    She stopped empagliflozin  due to UTIs and yeast infections.   Echo 12/22 EF 20-25%, mildly decreased RV function.   She was admitted in 10/23 with E faecalis UTI.  She was found to have T8/9 discitis/osteomyelitis.  TEE showed small TV vegetation that did not appear to involve her device.  Other findings were EF 30%, PASP 60 mmHg, mild MR, moderate-severe TR.  EP thought she was too high risk for device extraction.  She was treated with 6 wks of IV ampicillin  and ceftriaxone .  She is now on amoxicillin  for suppression.   Echo 3/24 EF 25-30%, mild LV dilation, mild RV dysfunction, mild MR, moderate TR, IVC dilated.   Admitted 2/25 with hemorrhagic cystitis, urinary retention and AKI. Scr peaked at 4.5. Required transfusion for anemia. Urology placed foley and had CBI to remove clots. Treated with IV ceftriaxone  and ertrapenem for resistant E. Coli UTI. Recommended to hold Eliquis  3-4 weeks,  spiro and KCL stopped. SNF recommended d/t debility which she declined.  Seen in clinic 3/25 with volume overload, Torsemide  increased. No showed to follow up and repeat labs.  She returns today for heart failure follow up with neighbor and granddaughter. Overall feeling in high spirits. NYHA III, limited by functional status. Reports dyspnea and fatigue. Denies chest pain, near-syncope, orthopnea, palpitations, dizziness, and abnormal bleeding. Utilizing walker intermittently for short distances, however otherwise uses WC. Has been having significantly dry skin.  Appetite okay. Compliant with all medications.  Social History   Socioeconomic History   Marital status: Widowed    Spouse name: Not on file   Number of children: Not on file   Years of education: Not on file   Highest education level: Not on file  Occupational History   Occupation: retired  Tobacco Use   Smoking status: Former    Current packs/day: 0.00    Average packs/day: 0.5 packs/day for 39.0 years (19.5 ttl pk-yrs)    Types: Cigarettes    Start date: 08/28/1954    Quit date: 08/28/1993    Years since quitting: 30.3   Smokeless tobacco: Never  Vaping Use   Vaping status: Never Used  Substance and Sexual Activity   Alcohol  use: No    Comment: quit 95   Drug use: No   Sexual activity: Not on file  Other Topics Concern   Not on file  Social History Narrative  Lives with husband and son in [place].  She is currently retired.   Former smoker quit in 1995.   Social Drivers of Health   Financial Resource Strain: Medium Risk (11/25/2019)   Overall Financial Resource Strain (CARDIA)    Difficulty of Paying Living Expenses: Somewhat hard  Food Insecurity: Low Risk  (12/06/2023)   Received from [facility]   Hunger Vital Sign    Worried About Running Out of Food in the Last Year: Never true    Ran Out of Food in the Last Year: Never true  Transportation Needs: No Transportation Needs (12/06/2023)   Received from  [facility]    In the past 12 months, has lack of reliable transportation kept you from medical appointments, meetings, work or from getting things needed for daily living? : No  Physical Activity: Not on file  Stress: Not on file  Social Connections: Moderately Isolated (10/30/2023)   Social Connection and Isolation Panel [NHANES]    Frequency of Communication with Friends and Family: More than three times a week    Frequency of Social Gatherings with Friends and Family: Twice a week    Attends Religious Services: 1 to 4 times per year    Active Member of [employer] or Organizations: No    Attends Banker Meetings: Never    Marital Status: Widowed  Intimate Partner Violence: Not At Risk (10/26/2023)   Humiliation, Afraid, Rape, and Kick questionnaire    Fear of Current or Ex-Partner: No    Emotionally Abused: No    Physically Abused: No    Sexually Abused: No   Family History  Adopted: Yes  Problem Relation Age of Onset   Other Mother    Hypertension Mother    Colon cancer Neg Hx    Esophageal cancer Neg Hx    Rectal cancer Neg Hx    Stomach cancer Neg Hx    Current Outpatient Medications  Medication Sig Dispense Refill   acetaminophen  (TYLENOL ) 650 MG CR tablet Take 1,300 mg by mouth 2 (two) times daily.     albuterol  (VENTOLIN  HFA) 108 (90 Base) MCG/ACT inhaler Inhale 2 puffs into the lungs every 6 (six) hours as needed for wheezing or shortness of breath.     amoxicillin  (AMOXIL ) 500 MG capsule Take 1 capsule (500 mg total) by mouth 2 (two) times daily. 60 capsule 11   Coenzyme Q10 (COQ-10) 100 MG CAPS Take 100 mg by mouth 2 (two) times daily.     colchicine  0.6 MG tablet Take 0.6 mg by mouth daily as needed (Gout).      Cranberry 250 MG TABS Take 500 mg by mouth 2 (two) times daily.     Dulaglutide  1.5 MG/0.5ML SOAJ Inject 1.5 mg into the skin once a week. Friday     ELDERBERRY PO Take 1 tablet by mouth 2 (two) times daily.     ezetimibe  (ZETIA ) 10  MG tablet Take 10 mg by mouth daily.     ferrous sulfate  325 (65 FE) MG tablet Take 325 mg by mouth every Monday, Wednesday, and Friday.     isosorbide -hydrALAZINE  (BIDIL ) 20-37.5 MG tablet TAKE 1/2 (ONE-HALF) TABLET BY MOUTH THREE TIMES DAILY 45 tablet 1   levothyroxine  (SYNTHROID ) 112 MCG tablet Take 1 tablet (112 mcg total) by mouth daily before breakfast. 30 tablet 12   metoprolol  succinate (TOPROL -XL) 50 MG 24 hr tablet Take 1 tablet (50 mg total) by mouth 2 (two) times daily. Take with or immediately  following a meal. 180 tablet 2   Multiple Vitamins-Minerals (MULTI FOR HER 50+ PO) Take 1 tablet by mouth daily.     Multiple Vitamins-Minerals (PRESERVISION AREDS 2) CAPS Take 1 capsule by mouth 2 (two) times daily.     potassium chloride  SA (KLOR-CON  M) 20 MEQ tablet Take 2 tablets (40 mEq total) by mouth daily. (Patient taking differently: Take 60 mEq by mouth daily.) 180 tablet 3   Propylene Glycol (SYSTANE BALANCE) 0.6 % SOLN Place 1 drop into both eyes in the morning and at bedtime.     rosuvastatin  (CRESTOR ) 5 MG tablet Take 5 mg by mouth every Monday, Wednesday, and Friday.     sertraline  (ZOLOFT ) 100 MG tablet Take 100 mg by mouth daily.     torsemide  (DEMADEX ) 20 MG tablet Take 4 tablets (80 mg total) by mouth 2 (two) times daily. 240 tablet 11   TRELEGY ELLIPTA 100-62.5-25 MCG/ACT AEPB Inhale 1 puff into the lungs daily.     umeclidinium-vilanterol (ANORO ELLIPTA ) 62.5-25 MCG/INH AEPB Inhale 1 puff into the lungs daily.     No current facility-administered medications for this encounter.   Wt Readings from Last 3 Encounters:  01/01/24 97.5 kg (215 lb)  11/20/23 101.7 kg (224 lb 3.2 oz)  11/19/23 98 kg (216 lb)   BP 102/60   Pulse 73   Wt 97.5 kg (215 lb)   SpO2 97%   BMI 39.32 kg/m   PHYSICAL EXAM: General: Elderly, frail appearing. No distress on RA. Arrived by [facility] Cardiac: JVP ~12cm. S1 and S2 present. No murmurs or rub. Abdomen: Soft, non-tender, non-distended.   Extremities: Warm and dry.  4+ BLE edema.  Neuro: Alert and oriented x3. Affect pleasant.   St. Jude Device interrogation (personally reviewed): Corvue with stable volume, no impedence. No events. VP 98%.  Assessment/Plan: 1. Chronic systolic CHF: Ischemic cardiomyopathy.  St Jude CRT-D device, now s/p AV nodal ablation.  Echo in 3/21 showed EF 20% with diffuse hypokinesis, mildly dilated RV with mildly decreased systolic function, PASP 49 mmHg, dilated IVC.  Echo 12/22 showed EF 20-25%, mildly decreased RV function.  TEE in 10/23 with EF 30%, PASP 60 mmHg, mild MR, moderate-severe TR, small TV vegetation.  Echo 3/24 showed  EF 25-30%, mild LV dilation, mild RV dysfunction, mild MR, moderate TR.  - NYHA III, limited by functional status. Volume significantly elevated by exam, despite stable CorVue. Suspect BLE edema with some component of PVD.  - Continue torsemide  80 mg bid + KCL 40 mEq daily. Check BMET/BNP, will call and instruct on what to do with diuretics after labs results. Has PRN metolazone  at home.  - Stop BiDil  0.5 tab tid. She has not taken yet this morning and SBP 100. - Continue Toprol  XL 50 mg bid.    - Off spiro with worsening CKD - Off empagliflozin  due to recurrent yeast infections and UTIs.   2. CAD: S/p CABG in 2015. Followed by Dr. Addie Holstein - Denies CP  - On Crestor  + Zetia . Check lipid panel  3. Atrial fibrillation: Chronic, present since 2018.  Unable to cardiovert her on amiodarone  so it was stopped. She is s/p AVN ablation to allow more BiV pacing. Rate controlled.   - Has appt with EP 5/23. Will discuss resuming Eliquis  2.5 mg bid.  - CBC today.  4. CKD Stage 4:  Most recent SCr 2.3 - check BMP today   5. 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  Also with T8/9 discitis/osteomyelitis.  -  She remains on suppressive amoxicillin  per ID.   6. Hemorrhagic cystitis: required CBI during  recent admission. Cleared by Urology, foley out. - no s/s bleeding -  will discuss resuming Eliquis  at EP f/u  Follow up in 2 wk with APP (volume and kidney function)  [place] Debra Colon, NP  01/01/2024

## 2024-01-01 ENCOUNTER — Encounter (HOSPITAL_COMMUNITY): Payer: Self-pay

## 2024-01-01 ENCOUNTER — Ambulatory Visit (INDEPENDENT_AMBULATORY_CARE_PROVIDER_SITE_OTHER): Payer: 59

## 2024-01-01 ENCOUNTER — Ambulatory Visit (HOSPITAL_COMMUNITY)
Admission: RE | Admit: 2024-01-01 | Discharge: 2024-01-01 | Disposition: A | Discharge status: Home / Self Care | Source: Ambulatory Visit | Attending: Cardiology | Admitting: Cardiology

## 2024-01-01 VITALS — BP 102/60 | HR 73 | Wt 215.0 lb

## 2024-01-01 DIAGNOSIS — L853 Xerosis cutis: Secondary | ICD-10-CM | POA: Diagnosis not present

## 2024-01-01 DIAGNOSIS — N3090 Cystitis, unspecified without hematuria: Secondary | ICD-10-CM | POA: Diagnosis not present

## 2024-01-01 DIAGNOSIS — I482 Chronic atrial fibrillation, unspecified: Secondary | ICD-10-CM | POA: Diagnosis not present

## 2024-01-01 DIAGNOSIS — I5022 Chronic systolic (congestive) heart failure: Secondary | ICD-10-CM

## 2024-01-01 DIAGNOSIS — Z79899 Other long term (current) drug therapy: Secondary | ICD-10-CM | POA: Diagnosis not present

## 2024-01-01 DIAGNOSIS — R5383 Other fatigue: Secondary | ICD-10-CM | POA: Diagnosis not present

## 2024-01-01 DIAGNOSIS — Z951 Presence of aortocoronary bypass graft: Secondary | ICD-10-CM | POA: Diagnosis not present

## 2024-01-01 DIAGNOSIS — I4821 Permanent atrial fibrillation: Secondary | ICD-10-CM | POA: Diagnosis not present

## 2024-01-01 DIAGNOSIS — Z8619 Personal history of other infectious and parasitic diseases: Secondary | ICD-10-CM | POA: Insufficient documentation

## 2024-01-01 DIAGNOSIS — N184 Chronic kidney disease, stage 4 (severe): Secondary | ICD-10-CM

## 2024-01-01 DIAGNOSIS — I079 Rheumatic tricuspid valve disease, unspecified: Secondary | ICD-10-CM

## 2024-01-01 DIAGNOSIS — Z87891 Personal history of nicotine dependence: Secondary | ICD-10-CM | POA: Insufficient documentation

## 2024-01-01 DIAGNOSIS — I255 Ischemic cardiomyopathy: Secondary | ICD-10-CM | POA: Insufficient documentation

## 2024-01-01 DIAGNOSIS — Z792 Long term (current) use of antibiotics: Secondary | ICD-10-CM | POA: Diagnosis not present

## 2024-01-01 DIAGNOSIS — I2510 Atherosclerotic heart disease of native coronary artery without angina pectoris: Secondary | ICD-10-CM | POA: Diagnosis not present

## 2024-01-01 DIAGNOSIS — J449 Chronic obstructive pulmonary disease, unspecified: Secondary | ICD-10-CM | POA: Diagnosis not present

## 2024-01-01 DIAGNOSIS — N3091 Cystitis, unspecified with hematuria: Secondary | ICD-10-CM

## 2024-01-01 DIAGNOSIS — M4624 Osteomyelitis of vertebra, thoracic region: Secondary | ICD-10-CM | POA: Insufficient documentation

## 2024-01-01 DIAGNOSIS — M4644 Discitis, unspecified, thoracic region: Secondary | ICD-10-CM | POA: Diagnosis not present

## 2024-01-01 DIAGNOSIS — Z87440 Personal history of urinary (tract) infections: Secondary | ICD-10-CM | POA: Insufficient documentation

## 2024-01-01 DIAGNOSIS — B952 Enterococcus as the cause of diseases classified elsewhere: Secondary | ICD-10-CM | POA: Diagnosis not present

## 2024-01-01 LAB — CUP PACEART REMOTE DEVICE CHECK
Battery Remaining Longevity: 44 mo
Battery Remaining Percentage: 46 %
Battery Voltage: 2.95 V
Date Time Interrogation Session: 20250505220014
HighPow Impedance: 63 Ohm
Implantable Lead Connection Status: 753985
Implantable Lead Connection Status: 753985
Implantable Lead Implant Date: 20201130
Implantable Lead Implant Date: 20201130
Implantable Lead Location: 753858
Implantable Lead Location: 753860
Implantable Pulse Generator Implant Date: 20201130
Lead Channel Impedance Value: 440 Ohm
Lead Channel Impedance Value: 530 Ohm
Lead Channel Pacing Threshold Amplitude: 0.625 V
Lead Channel Pacing Threshold Amplitude: 0.875 V
Lead Channel Pacing Threshold Pulse Width: 0.5 ms
Lead Channel Pacing Threshold Pulse Width: 0.5 ms
Lead Channel Sensing Intrinsic Amplitude: 12 mV
Lead Channel Setting Pacing Amplitude: 1.375
Lead Channel Setting Pacing Amplitude: 1.875
Lead Channel Setting Pacing Pulse Width: 0.5 ms
Lead Channel Setting Pacing Pulse Width: 0.5 ms
Lead Channel Setting Sensing Sensitivity: 0.5 mV
Pulse Gen Serial Number: 810000193
Zone Setting Status: 755011

## 2024-01-01 LAB — BASIC METABOLIC PANEL WITH GFR
Anion gap: 16 — ABNORMAL HIGH (ref 5–15)
BUN: 55 mg/dL — ABNORMAL HIGH (ref 8–23)
CO2: 23 mmol/L (ref 22–32)
Calcium: 9.2 mg/dL (ref 8.9–10.3)
Chloride: 95 mmol/L — ABNORMAL LOW (ref 98–111)
Creatinine, Ser: 2.55 mg/dL — ABNORMAL HIGH (ref 0.44–1.00)
GFR, Estimated: 18 mL/min — ABNORMAL LOW (ref >60)
Glucose, Bld: 105 mg/dL — ABNORMAL HIGH (ref 70–99)
Potassium: 3.1 mmol/L — ABNORMAL LOW (ref 3.5–5.1)
Sodium: 134 mmol/L — ABNORMAL LOW (ref 135–145)

## 2024-01-01 LAB — CBC
HCT: 36.1 % (ref 36.0–46.0)
Hemoglobin: 11.8 g/dL — ABNORMAL LOW (ref 12.0–15.0)
MCH: 27.1 pg (ref 26.0–34.0)
MCHC: 32.7 g/dL (ref 30.0–36.0)
MCV: 83 fL (ref 80.0–100.0)
Platelets: 205 10*3/uL (ref 150–400)
RBC: 4.35 MIL/uL (ref 3.87–5.11)
RDW: 18 % — ABNORMAL HIGH (ref 11.5–15.5)
WBC: 4.7 10*3/uL (ref 4.0–10.5)
nRBC: 0 % (ref 0.0–0.2)

## 2024-01-01 LAB — LIPID PANEL
Cholesterol: 85 mg/dL (ref 0–200)
HDL: 43 mg/dL (ref >40)
LDL Cholesterol: 33 mg/dL (ref 0–99)
Total CHOL/HDL Ratio: 2 ratio
Triglycerides: 44 mg/dL (ref <150)
VLDL: 9 mg/dL (ref 0–40)

## 2024-01-01 LAB — BRAIN NATRIURETIC PEPTIDE: B Natriuretic Peptide: 606.4 pg/mL — ABNORMAL HIGH (ref 0.0–100.0)

## 2024-01-01 NOTE — Patient Instructions (Addendum)
 Thank you for coming in today  If you had labs drawn today, any labs that are abnormal the clinic will call you No news is good news  Medications: STOP Bidil   Our office will call you with instructions on changing your diuretics   Follow up appointments:  Your physician recommends that you schedule a follow-up appointment in:  2 weeks in clinic   Do the following things EVERYDAY: Weigh yourself in the morning before breakfast. Write it down and keep it in a log. Take your medicines as prescribed Eat low salt foods--Limit salt (sodium) to 2000 mg per day.  Stay as active as you can everyday Limit all fluids for the day to less than 2 liters   At the [facility], you and your health needs are our priority. As part of our continuing mission to provide you with exceptional heart care, we have created designated Provider Care Teams. These Care Teams include your primary Cardiologist (physician) and Advanced Practice Providers (APPs- Physician Assistants and Nurse Practitioners) who all work together to provide you with the care you need, when you need it.   You may see any of the following providers on your designated Care Team at your next follow up: Dr Jules Oar Dr Peder Bourdon Dr. Mimi Alt, NP Ruddy Corral, [place] [facility] [place], [place] Dennise Fitz, NP Luster Salters, PharmD   Please be sure to bring in all your medications bottles to every appointment.    Thank you for choosing [facility]  If you have any questions or concerns before your next appointment please send us  a message through [facility] or call our office at [phone].    TO LEAVE A MESSAGE FOR THE NURSE SELECT OPTION 2, PLEASE LEAVE A MESSAGE INCLUDING: YOUR NAME DATE OF BIRTH CALL BACK NUMBER REASON FOR CALL**this is important as we prioritize the call backs  YOU WILL RECEIVE A CALL BACK THE SAME DAY AS LONG AS  YOU CALL BEFORE 4:00 PM

## 2024-01-02 ENCOUNTER — Ambulatory Visit: Attending: Cardiology

## 2024-01-02 DIAGNOSIS — I5022 Chronic systolic (congestive) heart failure: Secondary | ICD-10-CM | POA: Diagnosis not present

## 2024-01-02 DIAGNOSIS — Z95810 Presence of automatic (implantable) cardiac defibrillator: Secondary | ICD-10-CM

## 2024-01-02 NOTE — Progress Notes (Signed)
 EPIC Encounter for ICM Monitoring  Patient Name: Elaine [name] is a 83 y.o. female Date: 01/02/2024 Primary Care Physican: Jeannine Milroy., MD Primary Cardiologist: Harding/McLean Electrophysiologist: Morrell Aran Pacing: 98% 04/26/2022 Weight: 228 lbs 11/01/2022 Office Weight: 223 lbs 02/20/2023 Weight: 218 lbs 05/11/2023 Office Weight: 214 lbs      07/16/2023 Weight:  215 lbs    10/09/2023 Weight: 206 lbs      11/20/2023 Office Weight: 224 lbs 11/28/2023 Weight: 216 lbs       01/01/2024 Office Weight: 215 lbs                                       Transmission results reviewed.    OV with HF clinic on 5/6 and per note volume significantly elevated by exam, despite stable CorVue. Suspect BLE edema with some component of PVD.    CorVue thoracic impedance suggesting normal fluid levels since 3/28.     Prescribed:   Torsemide  20 mg take 4 tablets (80 mg total) by mouth twice a day.   Potassium 20 mEq take 2 tablets (40 mEq total) by mouth daily Spironolactone  25 mg take 1 tablet (25 mg total) by mouth daily. Per 5/6 HF clinic note pt has PRN Metolazone  at home but not added to [facility] Med list   Labs: 01/01/2024 Creatinine 2.55, BUN 55, Potassium 3.1, Sodium 134, GFR 18, BNP 606.4 (HF clinic address low Potassium 5/6) 11/20/2023 Creatinine 2.34, BUN 40, Potassium 5.4, Sodium 139, GFR 20 (HF clinic addressed Potassium), BNP 585.8 11/02/2023 Creatinine 2.34, BUN 70, Potassium 4.0, Sodium 129, GFR 20  11/01/2023 Creatinine 2.29, BUN 63, Potassium 3.8, Sodium 130, GFR 21  10/31/2023 Creatinine 2.24, BUN 68, Potassium 4.2, Sodium 135, GFR 21 10/30/2023 Creatinine 2.60, BUN 73, Potassium 3.4, Sodium 132, GFR 18  10/29/2023 Creatinine 2.98, BUN 83, Potassium 3.9, Sodium 132, GFR 15  10/28/2023 Creatinine 3.44, BUN 92, Potassium 3.6, Sodium 129, GFR 13  A complete set of results can be found in Results Review.   Recommendations:  Per lab notes advised to take additional 40 mEq KCL today, 5/6. Fluid  marker is also elevated. Have her take metolazone  2.5 mg tablet + additional 40 mEq KCL twice this week    Follow-up plan: ICM clinic phone appointment on 01/07/2024 to check stability of fluid levels.   91 day device clinic remote transmission 04/01/2024.     EP/Cardiology Office Visits:  01/25/2024 with HF Clinic.  01/18/2024 with Mertha Abrahams, PA.   Next appt to be scheduled 11/18/2024 with Dr Addie Holstein (no recall for 1 yr f/u).     Copy of ICM check sent to Dr. Lawana Pray.    3 month ICM trend: 12/31/2023.    12-14 Month ICM trend:     Almyra Jain, RN 01/02/2024 7:22 AM

## 2024-01-07 ENCOUNTER — Ambulatory Visit: Attending: Cardiology

## 2024-01-07 DIAGNOSIS — Z95810 Presence of automatic (implantable) cardiac defibrillator: Secondary | ICD-10-CM

## 2024-01-07 DIAGNOSIS — I5022 Chronic systolic (congestive) heart failure: Secondary | ICD-10-CM

## 2024-01-08 ENCOUNTER — Other Ambulatory Visit (HOSPITAL_COMMUNITY): Payer: Self-pay | Admitting: Cardiology

## 2024-01-11 NOTE — Progress Notes (Signed)
 EPIC Encounter for ICM Monitoring  Patient Name: Elaine [name] is a 83 y.o. female Date: 01/11/2024 Primary Care Physican: Jeannine Milroy., MD Primary Cardiologist: Harding/McLean Electrophysiologist: Morrell Aran Pacing: 98% 04/26/2022 Weight: 228 lbs 11/01/2022 Office Weight: 223 lbs 02/20/2023 Weight: 218 lbs 05/11/2023 Office Weight: 214 lbs      07/16/2023 Weight:  215 lbs    10/09/2023 Weight: 206 lbs      11/20/2023 Office Weight: 224 lbs 11/28/2023 Weight: 216 lbs       01/01/2024 Office Weight: 215 lbs                                       Transmission results reviewed.       CorVue thoracic impedance suggesting fluid levels remain stable since 3/28.   Prescribed:   Torsemide  20 mg take 4 tablets (80 mg total) by mouth twice a day.   Potassium 20 mEq take 2 tablets (40 mEq total) by mouth daily Per 5/6 HF clinic note pt has PRN Metolazone  at home but not added to [facility] Med list   Labs: 01/01/2024 Creatinine 2.55, BUN 55, Potassium 3.1, Sodium 134, GFR 18, BNP 606.4 (HF clinic addressed low Potassium 5/6) 11/20/2023 Creatinine 2.34, BUN 40, Potassium 5.4, Sodium 139, GFR 20 (HF clinic addressed Potassium), BNP 585.8 11/02/2023 Creatinine 2.34, BUN 70, Potassium 4.0, Sodium 129, GFR 20  11/01/2023 Creatinine 2.29, BUN 63, Potassium 3.8, Sodium 130, GFR 21  10/31/2023 Creatinine 2.24, BUN 68, Potassium 4.2, Sodium 135, GFR 21 10/30/2023 Creatinine 2.60, BUN 73, Potassium 3.4, Sodium 132, GFR 18  10/29/2023 Creatinine 2.98, BUN 83, Potassium 3.9, Sodium 132, GFR 15  10/28/2023 Creatinine 3.44, BUN 92, Potassium 3.6, Sodium 129, GFR 13  A complete set of results can be found in Results Review.   Recommendations:  No changes.   Follow-up plan: ICM clinic phone appointment on 02/04/2024.   91 day device clinic remote transmission 04/01/2024.     EP/Cardiology Office Visits:  01/25/2024 with HF Clinic.  01/18/2024 with Mertha Abrahams, PA.   Next appt to be scheduled 11/18/2024 with Dr Addie Holstein  (no recall for 1 yr f/u).     Copy of ICM check sent to Dr. Lawana Pray.    3 month ICM trend: 01/06/2024.    12-14 Month ICM trend:     Almyra Jain, RN 01/11/2024 8:00 AM

## 2024-01-14 ENCOUNTER — Encounter: Payer: Self-pay | Admitting: Internal Medicine

## 2024-01-17 NOTE — Progress Notes (Signed)
 Cardiology Office Note:  .   Date:  01/17/2024  ID:  DIANN BANGERTER, DOB 1941-03-26, MRN [id number] PCP: Jeannine Milroy., MD  [facility] Providers Cardiologist:  Randene Bustard, MD Electrophysiologist:  Will Cortland Ding, MD  Advanced Heart Failure:  Peder Bourdon, MD {  History of Present Illness: Aaron Aas   NAKEIA CALVI is a 83 y.o. female w/PMHx of  COPD, CKD (IIIb) CAD s/p CABG  chronic systolic systolic CHF/ICM/ICD LBBB AFib  admitted in 10/23 with E faecalis UTI. She was found to have T8/9 discitis/osteomyelitis. TEE showed small TV vegetation that did not appear to involve her device. Other findings were EF 30%, PASP 60 mmHg, mild MR, moderate-severe TR. EP thought she was too high risk for device extraction. She was treated with 6 wks of IV ampicillin  and ceftriaxone  >> amoxicillin  for suppression.   Following intermittently with EP team though regularly with the AHF clinic  Most recently for EP Brandi Dec 2024, rate controlled, planned for 6 mo visit  AHF team 01/01/24, using walker or wheelchair, reported doing well and in good spirits Found volume OL planned for adjustment in diuretics Off a/c > reports to discuss resuming Eliquis  w/EP at upcoming appt  Appears she was off  [facility] Feb 2025 2/2 hemorrhagic cystitis, urinary retention and AKI. Scr peaked at 4.5. Required transfusion for anemia. Urology placed foley and had CBI to remove clots. Treated with IV ceftriaxone  and ertrapenem for resistant E. Coli UTI. Recommended to hold Eliquis  3-4 weeks    Today's visit is scheduled as a 5 mo visit  ROS:   She is accompanied by a very close friend that she refers to as her daughter She denies any CP, palpitations or cardiac awareness No near syncope or syncope She is struggling with gout and a little wound on her "backside" Denies SOB, in fact she thinks she is a little dry, says her mouth is dry   She reports the her wound is more like peeling skin, denies bleeding,  draining Self cares with a ointment and is reported as clean/dry. Advised importance of good care and if any skin breakdown/wound needs to be addressed quickly, she understands   Device information SJM CRT-D S/p AV node ablation 05/19/2020  Arrhythmia/AAD hx Amiodarone  > failed to maintain rhythm > stopped 2021 Permanent AFib  Studies Reviewed: Aaron Aas    EKG not done today  DEVICE interrogation done today and reviewed by myself Battery and lead measurements are good No VT BP 98%  11/01/22: TTE 1. Left ventricular ejection fraction, by estimation, is 25 to 30%. The  left ventricle has severely decreased function. The left ventricle  demonstrates regional wall motion abnormalities. All apical segments are  akinetic with dyskinesis of the LV apex.  The mid-to-apical septal and mid-to-apical anterior walls are akinetic.  The mid inferior and lateral walls are hypokinetic. Wall motion stable  from prior TTE. The left ventricular internal cavity size was mildly  dilated. Left ventricular diastolic  parameters are indeterminate. The average left ventricular global  longitudinal strain is -7.0 %. The global longitudinal strain is abnormal.   2. Right ventricular systolic function is mildly reduced. The right  ventricular size is mildly enlarged. There is moderately elevated  pulmonary artery systolic pressure. The estimated right ventricular  systolic pressure is 47.3 mmHg.   3. Left atrial size was moderately dilated.   4. Right atrial size was mildly dilated.   5. The mitral valve is degenerative. Mild mitral valve  regurgitation.  Severe mitral annular calcification.   6. Tricuspid valve regurgitation is moderate.   7. The aortic valve is tricuspid. Aortic valve regurgitation is not  visualized. Aortic valve sclerosis is present, with no evidence of aortic  valve stenosis.   8. The inferior vena cava is dilated in size with <50% respiratory  variability, suggesting right atrial  pressure of 15 mmHg.   Comparison(s): No significant change from prior study.     TEE 05/2022 > LVEF 30%, LV global hypokinesis, no vegetation noted on pacing wires in RA/RV, severely elevated pulmonary artery systolic pressure, RVSP 60 mmHg, LAA surgically ligated, LA moderately dilated, no LA/LAA thrombus, RA mildly dilated, small <1cm vegetation on TV, TV with mod-severe regurgitation    Risk Assessment/Calculations:    Physical Exam:   VS:  There were no vitals taken for this visit.   Wt Readings from Last 3 Encounters:  01/01/24 215 lb (97.5 kg)  11/20/23 224 lb 3.2 oz (101.7 kg)  11/19/23 216 lb (98 kg)    GEN: Well nourished, well developed in no acute distress NECK: No JVD; No carotid bruits CARDIAC: RRR (paced), no murmurs, rubs, gallops RESPIRATORY:  CTA b/l without rales, wheezing or rhonchi  ABDOMEN: Soft, non-tender, non-distended EXTREMITIES: No edema; No deformity   ICD site: is stable, no thinning, fluctuation, tethering  ASSESSMENT AND PLAN: .    CRT-D intact function no programming changes made  Last EKG march 2025, is reviewed, RBBB paced morphology  permanent AFib CHA2DS2Vasc is 5, discussed above, off OAC after a hospitalization with hemorrhagic cystitis Anemia that required transfusion Rate controlled w/hx of AVnode ablation  Discussed preference to resume her Eliuqis, anticoagulation She says she has always been told it was stopped because of the bleeding They report that she sees her urologist regularly and will be seeing him in June (Dr. Elfredia Grippe III) I will end my note and inquire about resuming hir OAC  CAD No CP ICM Chronic CHF She is quite edematous ("always"), CorVue is at threshold, lings are clear She reports dry mouth Sees HF team next week > deferred to them C/w AHF team/Dr. Addie Holstein  Secondary hypercoagulable state 2/2 AFib   Dispo: remotes as usual, back to see us  otherwise in 58mo, sooner if needed.  Pending her  urologist response regarding her OAC.    Signed, Debbie Fails, PA-C

## 2024-01-18 ENCOUNTER — Ambulatory Visit: Payer: 59 | Attending: Pulmonary Disease | Admitting: Physician Assistant

## 2024-01-18 VITALS — BP 110/72 | HR 74 | Ht 62.0 in | Wt 200.0 lb

## 2024-01-18 DIAGNOSIS — D6869 Other thrombophilia: Secondary | ICD-10-CM

## 2024-01-18 DIAGNOSIS — Z95810 Presence of automatic (implantable) cardiac defibrillator: Secondary | ICD-10-CM

## 2024-01-18 DIAGNOSIS — I2510 Atherosclerotic heart disease of native coronary artery without angina pectoris: Secondary | ICD-10-CM

## 2024-01-18 DIAGNOSIS — I4821 Permanent atrial fibrillation: Secondary | ICD-10-CM

## 2024-01-18 DIAGNOSIS — I255 Ischemic cardiomyopathy: Secondary | ICD-10-CM | POA: Diagnosis not present

## 2024-01-18 NOTE — Patient Instructions (Addendum)
 Medication Instructions:   Your physician recommends that you continue on your current medications as directed. Please refer to the Current Medication list given to you today.  *If you need a refill on your cardiac medications before your next appointment, please call your pharmacy*  Lab Work:  NONE ORDERED  TODAY   If you have labs (blood work) drawn today and your tests are completely normal, you will receive your results only by: MyChart Message (if you have MyChart) OR A paper copy in the mail If you have any lab test that is abnormal or we need to change your treatment, we will call you to review the results.  Testing/Procedures: NONE ORDERED  TODAY    Follow-Up: At [facility], you and your health needs are our priority.  As part of our continuing mission to provide you with exceptional heart care, our providers are all part of one team.  This team includes your primary Cardiologist (physician) and Advanced Practice Providers or APPs (Physician Assistants and Nurse Practitioners) who all work together to provide you with the care you need, when you need it.  Your next appointment:    6 month(s)   Provider:   You may see Will Cortland Ding, MD or one of the following Advanced Practice Providers on your designated Care Team:   Mertha Abrahams, [place]    We recommend signing up for the patient portal called "MyChart".  Sign up information is provided on this After Visit Summary.  MyChart is used to connect with patients for Virtual Visits (Telemedicine).  Patients are able to view lab/test results, encounter notes, upcoming appointments, etc.  Non-urgent messages can be sent to your provider as well.   To learn more about what you can do with MyChart, go to [email or web address].   Other Instructions

## 2024-01-23 NOTE — Progress Notes (Incomplete)
 ID:  Elaine [name], DOB 07-May-1941, MRN [id number]  Provider location: [place] [facility] Type of Visit:  Heart Failure Follow up  PCP:  Pecolia Bourbon, MD  Primary Cardiologist:  Randene Bustard, MD EP: Dr Benson Brawn HF Cardiologist: Dr. Mitzie Anda  Reason for Visit: F/u for chronic systolic heart failure    HPI: Elaine [name] is a 83 y.o. with history of CAD s/p CABG, COPD, CKD stage 3, and chronic systolic systolic CHF/ischemic cardiomyopathy.  She was referred by Dr. Addie Holstein for evaluation of CHF.  Patient was admitted in 2/15 with atrial fibrillation/RVR and chest pain.  Cath showed severe 3VD and she had CABG x 3.  Echo in 2/15 showed EF down to 20-25%. In 2018, it appears that she went back into atrial fibrillation and has remained in atrial fibrillation persistently.  DCCV was not attempted.   Echo in 10/20 showed EF < 20% with normal RV.  She had a St Jude CRT-D device implanted in 11/20.  She says that she feels "90%" better with CRT.  She is followed by Dr. Christianne Cowper with nephrology.   She has been in atrial fibrillation persistently. Failed attempted DCCV while on amiodarone  but she did not convert.  We subsequently stopped amiodarone .    Echo in 3/21 showed EF 20% with diffuse hypokinesis, mildly dilated RV with mildly decreased systolic function, PASP 49 mmHg, dilated IVC.  In 9/21, she had AV nodal ablation.    She stopped empagliflozin  due to UTIs and yeast infections.   Echo 12/22 EF 20-25%, mildly decreased RV function.   She was admitted in 10/23 with E faecalis UTI.  She was found to have T8/9 discitis/osteomyelitis.  TEE showed small TV vegetation that did not appear to involve her device.  Other findings were EF 30%, PASP 60 mmHg, mild MR, moderate-severe TR.  EP thought she was too high risk for device extraction.  She was treated with 6 wks of IV ampicillin  and ceftriaxone .  She is now on amoxicillin  for suppression.   Echo 3/24 EF 25-30%, mild LV dilation, mild  RV dysfunction, mild MR, moderate TR, IVC dilated.   Admitted 2/25 with hemorrhagic cystitis, urinary retention and AKI. Scr peaked at 4.5. Required transfusion for anemia. Urology placed foley and had CBI to remove clots. Treated with IV ceftriaxone  and ertrapenem for resistant E. Coli UTI. Recommended to hold Eliquis  3-4 weeks, spiro and KCL stopped. SNF recommended d/t debility which she declined.   Seen in clinic 3/25 with volume overload, Torsemide  increased.   Most recent visit 5/5, BP was soft. Bidil  discontinued. Also noted to be volume overloaded and instructed to take metolazone  2xs that wk.   She presents back today for repeat assessment of volume status.        St Jude device interrogation: 98% BiV pacing, stable thoracic impedance today be recently decreased  ECG (personally reviewed) ***   Labs (2/21): K 4.5, creatinine 2.08 => 2.99, hgb 12.8 Labs (7/21): K 4, creatinine 2.3, TSH normal Labs (12/21): K 4, creatinine 2.31, BNP 671 Labs (1/22): K 4.1, creatinine 2.48  Labs (3/22): LDL 77 Labs (6/22): K 4.9, creatinine 2.26 Labs (10/22): K 4.8, creatinine 2.41 Labs (12/22): LDL 62, hgb 13.5 Labs (2/23): K 4.4, creatinine 2.28 Labs (11/11/21): K 5.8  Labs (3/23): BUN 52 creatinine 2.5 K 4.7  Labs (5/23): K 4.2, creatinine 2.17  Labs (8/23): K 3.7, creatinine 2.11, hgb 12.5 Labs (12/23): K 4, creatinine 2.14, hgb 10.4 Labs (1/24): K  3.2, creatinine 2.35 Labs (3/24): K 4.4, creatinine 2.11 => 1.98, BNP 302, hgb 12.2 Labs (5/25): K 3.1, creatinine 2.5,  BNP 606, LDL 33    PMH: 1. CAD: s/p CABG in 2/15 with LIMA-LAD, SVG-OM2, SVG-PLV.  2. LBBB: Chronic.  3. Atrial fibrillation: Persistent since 2018.  - LA appendage clip with CABG.  4. H/o aspiration PNA 5. Type 2 diabetes 6. COPD 7. CKD stage 4 8. Chronic systolic CHF: Ischemic cardiomyopathy.  She has a St Jude BiV ICD.  - Echo (2/15): EF 20-25%.  - Echo (5/15): EF 40-45% - Echo (10/20): EF <20%, mild LVH, normal  RV size and systolic function.  - Echo (3/21): EF 20% with diffuse hypokinesis, mildly dilated RV with mildly decreased systolic function, PASP 49 mmHg, dilated IVC.  - AV nodal ablation - Echo (12/22): EF 20-25%, mildly decreased RV function. - TEE (10/23): Small TV vegetation that did not appear to involve her device.  Other findings were EF 30%, PASP 60 mmHg, mild MR, moderate-severe TR - Echo (3/24): EF 25-30%, mild LV dilation, mild RV dysfunction, mild MR, moderate TR, IVC dilated.  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 She also had T8/T9 discitis/osteomyelitis.   Social History   Socioeconomic History   Marital status: Widowed    Spouse name: Not on file   Number of children: Not on file   Years of education: Not on file   Highest education level: Not on file  Occupational History   Occupation: retired  Tobacco Use   Smoking status: Former    Current packs/day: 0.00    Average packs/day: 0.5 packs/day for 39.0 years (19.5 ttl pk-yrs)    Types: Cigarettes    Start date: 08/28/1954    Quit date: 08/28/1993    Years since quitting: 30.4   Smokeless tobacco: Never  Vaping Use   Vaping status: Never Used  Substance and Sexual Activity   Alcohol  use: No    Comment: quit 95   Drug use: No   Sexual activity: Not on file  Other Topics Concern   Not on file  Social History Narrative   Lives with husband and son in [place].  She is currently retired.   Former smoker quit in 1995.   Social Drivers of Health   Financial Resource Strain: Medium Risk (11/25/2019)   Overall Financial Resource Strain (CARDIA)    Difficulty of Paying Living Expenses: Somewhat hard  Food Insecurity: Low Risk  (12/06/2023)   Received from [facility]   Hunger Vital Sign    Worried About Running Out of Food in the Last Year: Never true    Ran Out of Food in the Last Year: Never true  Transportation Needs: No Transportation Needs (12/06/2023)   Received from [employer]    In the past 12 months, has lack of reliable transportation kept you from medical appointments, meetings, work or from getting things needed for daily living? : No  Physical Activity: Not on file  Stress: Not on file  Social Connections: Moderately Isolated (10/30/2023)   Social Connection and Isolation Panel [NHANES]    Frequency of Communication with Friends and Family: More than three times a week    Frequency of Social Gatherings with Friends and Family: Twice a week    Attends Religious Services: 1 to 4 times per year    Active Member of [employer] or Organizations: No    Attends Banker Meetings: Never  Marital Status: Widowed  Intimate Partner Violence: Not At Risk (10/26/2023)   Humiliation, Afraid, Rape, and Kick questionnaire    Fear of Current or Ex-Partner: No    Emotionally Abused: No    Physically Abused: No    Sexually Abused: No   Family History  Adopted: Yes  Problem Relation Age of Onset   Other Mother    Hypertension Mother    Colon cancer Neg Hx    Esophageal cancer Neg Hx    Rectal cancer Neg Hx    Stomach cancer Neg Hx    Current Outpatient Medications  Medication Sig Dispense Refill   acetaminophen  (TYLENOL ) 650 MG CR tablet Take 1,300 mg by mouth 2 (two) times daily.     albuterol  (VENTOLIN  HFA) 108 (90 Base) MCG/ACT inhaler Inhale 2 puffs into the lungs every 6 (six) hours as needed for wheezing or shortness of breath.     amoxicillin  (AMOXIL ) 500 MG capsule Take 1 capsule (500 mg total) by mouth 2 (two) times daily. 60 capsule 11   Coenzyme Q10 (COQ-10) 100 MG CAPS Take 100 mg by mouth 2 (two) times daily.     colchicine  0.6 MG tablet Take 0.6 mg by mouth daily as needed (Gout).      Cranberry 250 MG TABS Take 500 mg by mouth 2 (two) times daily.     Dulaglutide  1.5 MG/0.5ML SOAJ Inject 1.5 mg into the skin once a week. Friday     ELDERBERRY PO Take 1 tablet by mouth 2 (two) times daily.     ezetimibe  (ZETIA ) 10 MG tablet  Take 10 mg by mouth daily.     ferrous sulfate  325 (65 FE) MG tablet Take 325 mg by mouth every Monday, Wednesday, and Friday.     levothyroxine  (SYNTHROID ) 112 MCG tablet Take 1 tablet (112 mcg total) by mouth daily before breakfast. 30 tablet 12   metoprolol  succinate (TOPROL -XL) 50 MG 24 hr tablet TAKE 1 TABLET BY MOUTH TWICE DAILY. TAKE WITH OR IMMEDIATELY FOLLOWING A MEAL. 180 tablet 0   Multiple Vitamins-Minerals (MULTI FOR HER 50+ PO) Take 1 tablet by mouth daily.     Multiple Vitamins-Minerals (PRESERVISION AREDS 2) CAPS Take 1 capsule by mouth 2 (two) times daily.     potassium chloride  SA (KLOR-CON  M) 20 MEQ tablet Take 2 tablets (40 mEq total) by mouth daily. (Patient taking differently: Take 60 mEq by mouth daily.) 180 tablet 3   Propylene Glycol (SYSTANE BALANCE) 0.6 % SOLN Place 1 drop into both eyes in the morning and at bedtime.     rosuvastatin  (CRESTOR ) 5 MG tablet Take 5 mg by mouth every Monday, Wednesday, and Friday.     sertraline  (ZOLOFT ) 100 MG tablet Take 100 mg by mouth daily.     torsemide  (DEMADEX ) 20 MG tablet Take 4 tablets (80 mg total) by mouth 2 (two) times daily. 240 tablet 11   TRELEGY ELLIPTA 100-62.5-25 MCG/ACT AEPB Inhale 1 puff into the lungs daily.     umeclidinium-vilanterol (ANORO ELLIPTA ) 62.5-25 MCG/INH AEPB Inhale 1 puff into the lungs daily.     No current facility-administered medications for this visit.   Wt Readings from Last 3 Encounters:  01/18/24 90.7 kg (200 lb)  01/01/24 97.5 kg (215 lb)  11/20/23 101.7 kg (224 lb 3.2 oz)   PHYSICAL EXAM: General:  Well appearing. No respiratory difficulty HEENT: normal Neck: supple. no JVD. Carotids 2+ bilat; no bruits. No lymphadenopathy or thyromegaly appreciated. Cor: PMI nondisplaced. Regular rate & rhythm.  No rubs, gallops or murmurs. Lungs: clear Abdomen: soft, nontender, nondistended. No hepatosplenomegaly. No bruits or masses. Good bowel sounds. Extremities: no cyanosis, clubbing, rash,  edema Neuro: alert & oriented x 3, cranial nerves grossly intact. moves all 4 extremities w/o difficulty. Affect pleasant.   Assessment/Plan: 1.Chronic systolic CHF: Ischemic cardiomyopathy.  St Jude CRT-D device, now s/p AV nodal ablation.  Echo in 3/21 showed EF 20% with diffuse hypokinesis, mildly dilated RV with mildly decreased systolic function, PASP 49 mmHg, dilated IVC.  Echo 12/22 showed EF 20-25%, mildly decreased RV function.  TEE in 10/23 with EF 30%, PASP 60 mmHg, mild MR, moderate-severe TR, small TV vegetation.  Echo 3/24 showed  EF 25-30%, mild LV dilation, mild RV dysfunction, mild MR, moderate TR. NYHA Class. Volume *** CorVue ***   - Continue torsemide  80 mg bid  - Continue Toprol  XL 50 mg bid  - off Spironolactone  due to CKD  - off Bidil  due to low BP  - off SGLT2i given h/o frequent UTIs  2. CAD: S/p CABG in 2015. Denies CP ***  - not on ASA given need for Eliquis    - Continue Crestor  5 qod + Zetia . - Continue ? blocker, Toprol  XL  3. Atrial fibrillation: Chronic now, looks like it has gone on since 2018.  Unable to cardiovert her on amiodarone  so it was stopped. She is now s/p AVN ablation to allow more BiV pacing. Rate controlled.   - Continue ? blocker - Continue apixaban  2.5 mg bid. Reduced dose given age + SCr. ? Bleeding? Check CBC today   4. CKD Stage 4: Most recent creatinine 2.5 - check BMP today  - not on SGLT2i given frequent GU infections  5. 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  Also with T8/9 discitis/osteomyelitis.  - She remains on suppressive amoxicillin  per ID.   Follow up w/ Dr. Mitzie Anda in 3-4 months   Signed, Ruddy Corral, PA-C  01/23/2024  [facility] [address] [facility] [place] [address] [phone] (office) [phone] (fax)

## 2024-01-25 ENCOUNTER — Encounter (HOSPITAL_COMMUNITY)

## 2024-01-27 DEATH — deceased

## 2024-02-04 ENCOUNTER — Telehealth: Payer: Self-pay

## 2024-02-04 ENCOUNTER — Encounter

## 2024-02-04 NOTE — Telephone Encounter (Signed)
 Attempted call to patient and left message that home monitor shows it is disconnected and not receiving remote transmissions. Left [employer] support number and advised to call for assistance.

## 2024-02-06 NOTE — Telephone Encounter (Signed)
 Attempted ICM Call to patient and no answer.  Missed ICM remote transmission on 6/9 rescheduled for 7/14.

## 2024-02-13 NOTE — Progress Notes (Signed)
 Remote ICD transmission.

## 2024-03-10 ENCOUNTER — Telehealth: Payer: Self-pay

## 2024-03-10 ENCOUNTER — Encounter

## 2024-03-10 NOTE — Telephone Encounter (Signed)
 Attempted ICM call to address disconnected home device monitor.  Left message with call back and phone number to return call.

## 2024-03-12 NOTE — Progress Notes (Signed)
 No ICM remote transmission received for 03/10/2024 and next ICM transmission scheduled for 03/24/2024.

## 2024-03-14 ENCOUNTER — Encounter: Payer: Self-pay | Admitting: Advanced Practice Midwife

## 2024-03-28 NOTE — Progress Notes (Signed)
 No ICM remote transmission received for 03/25/2024 and next ICM transmission scheduled for 04/14/2024.

## 2024-04-01 ENCOUNTER — Ambulatory Visit: Payer: 59

## 2024-04-02 ENCOUNTER — Ambulatory Visit: Payer: 59 | Admitting: Internal Medicine

## 2024-04-08 ENCOUNTER — Telehealth: Payer: Self-pay

## 2024-04-08 NOTE — Telephone Encounter (Signed)
 Attempted call to patient regarding ICM remote transmission and left message home monitor is disconnected. Left [employer] support number to call for assistance.

## 2024-04-14 NOTE — Progress Notes (Signed)
 Unable to reach patient for monthly ICM remote follow up and not receiving monthly remote transmission.  Patient disenrolled due patient is not actively participating in [facility] clinic.  Device clinic 91 day remote monitoring will continue per protocol.   ?

## 2024-07-01 ENCOUNTER — Ambulatory Visit: Payer: 59

## 2024-09-30 ENCOUNTER — Ambulatory Visit: Payer: 59

## 2024-10-02 ENCOUNTER — Telehealth: Payer: Self-pay | Admitting: Cardiology

## 2024-10-02 NOTE — Telephone Encounter (Signed)
 Patient was called regarding missed transmission

## 2024-12-30 ENCOUNTER — Ambulatory Visit: Payer: 59
